# Patient Record
Sex: Female | Born: 1965
Health system: Southern US, Community
[De-identification: ages and names within clinical notes are randomized; demographics above are authoritative.]

## PROBLEM LIST (undated history)

## (undated) DIAGNOSIS — Z973 Presence of spectacles and contact lenses: Secondary | ICD-10-CM

## (undated) DIAGNOSIS — H269 Unspecified cataract: Secondary | ICD-10-CM

## (undated) DIAGNOSIS — R569 Unspecified convulsions: Secondary | ICD-10-CM

## (undated) DIAGNOSIS — D649 Anemia, unspecified: Secondary | ICD-10-CM

## (undated) DIAGNOSIS — E785 Hyperlipidemia, unspecified: Secondary | ICD-10-CM

## (undated) DIAGNOSIS — I639 Cerebral infarction, unspecified: Secondary | ICD-10-CM

## (undated) DIAGNOSIS — N2889 Other specified disorders of kidney and ureter: Secondary | ICD-10-CM

## (undated) DIAGNOSIS — R233 Spontaneous ecchymoses: Secondary | ICD-10-CM

## (undated) DIAGNOSIS — K219 Gastro-esophageal reflux disease without esophagitis: Secondary | ICD-10-CM

## (undated) DIAGNOSIS — R238 Other skin changes: Secondary | ICD-10-CM

## (undated) DIAGNOSIS — J189 Pneumonia, unspecified organism: Secondary | ICD-10-CM

## (undated) DIAGNOSIS — K922 Gastrointestinal hemorrhage, unspecified: Secondary | ICD-10-CM

## (undated) DIAGNOSIS — N186 End stage renal disease: Secondary | ICD-10-CM

## (undated) DIAGNOSIS — Z992 Dependence on renal dialysis: Secondary | ICD-10-CM

## (undated) DIAGNOSIS — R011 Cardiac murmur, unspecified: Secondary | ICD-10-CM

## (undated) DIAGNOSIS — E039 Hypothyroidism, unspecified: Secondary | ICD-10-CM

## (undated) DIAGNOSIS — I10 Essential (primary) hypertension: Secondary | ICD-10-CM

## (undated) DIAGNOSIS — C801 Malignant (primary) neoplasm, unspecified: Secondary | ICD-10-CM

## (undated) DIAGNOSIS — G629 Polyneuropathy, unspecified: Secondary | ICD-10-CM

## (undated) HISTORY — DX: Cerebral infarction, unspecified: I63.9

---
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
## 1985-07-06 HISTORY — PX: ECTOPIC PREGNANCY SURGERY: SHX613

## 1998-04-22 ENCOUNTER — Emergency Department (HOSPITAL_COMMUNITY): Admission: EM | Admit: 1998-04-22 | Discharge: 1998-04-22 | Payer: Self-pay | Admitting: Emergency Medicine

## 2000-04-21 ENCOUNTER — Encounter: Payer: Self-pay | Admitting: Obstetrics

## 2000-04-21 ENCOUNTER — Ambulatory Visit (HOSPITAL_COMMUNITY): Admission: RE | Admit: 2000-04-21 | Discharge: 2000-04-21 | Payer: Self-pay | Admitting: Obstetrics

## 2001-05-25 ENCOUNTER — Encounter: Payer: Self-pay | Admitting: Obstetrics and Gynecology

## 2001-06-01 ENCOUNTER — Encounter (INDEPENDENT_AMBULATORY_CARE_PROVIDER_SITE_OTHER): Payer: Self-pay

## 2001-06-01 ENCOUNTER — Inpatient Hospital Stay (HOSPITAL_COMMUNITY): Admission: RE | Admit: 2001-06-01 | Discharge: 2001-06-03 | Payer: Self-pay | Admitting: Obstetrics and Gynecology

## 2002-02-02 ENCOUNTER — Ambulatory Visit (HOSPITAL_COMMUNITY): Admission: RE | Admit: 2002-02-02 | Discharge: 2002-02-02 | Payer: Self-pay | Admitting: *Deleted

## 2002-02-02 ENCOUNTER — Encounter: Payer: Self-pay | Admitting: *Deleted

## 2003-04-03 ENCOUNTER — Other Ambulatory Visit: Admission: RE | Admit: 2003-04-03 | Discharge: 2003-04-03 | Payer: Self-pay | Admitting: *Deleted

## 2008-12-07 ENCOUNTER — Encounter (HOSPITAL_COMMUNITY): Admission: RE | Admit: 2008-12-07 | Discharge: 2009-01-02 | Payer: Self-pay | Admitting: Internal Medicine

## 2009-06-06 ENCOUNTER — Encounter: Admission: RE | Admit: 2009-06-06 | Discharge: 2009-06-06 | Payer: Self-pay | Admitting: Internal Medicine

## 2010-02-08 ENCOUNTER — Emergency Department (HOSPITAL_COMMUNITY): Admission: EM | Admit: 2010-02-08 | Discharge: 2010-02-09 | Payer: Self-pay | Admitting: Emergency Medicine

## 2010-02-11 ENCOUNTER — Emergency Department (HOSPITAL_COMMUNITY): Admission: EM | Admit: 2010-02-11 | Discharge: 2010-02-11 | Payer: Self-pay | Admitting: Family Medicine

## 2010-11-19 ENCOUNTER — Other Ambulatory Visit (HOSPITAL_COMMUNITY): Payer: Self-pay | Admitting: Nephrology

## 2010-11-19 DIAGNOSIS — R809 Proteinuria, unspecified: Secondary | ICD-10-CM

## 2010-11-21 NOTE — Discharge Summary (Signed)
[facility]  Patient:    CHRISTEEN, LOVE Visit Number: [id number] MRN: [id number]          Service Type: GYN Location: 4W 0455 01 Attending Physician:  Richarda Blade Dictated by:   Chauncey Cruel. Olena Mater, M.D. Admit Date:  06/01/2001 Disc. Date: 06/03/01                             Discharge Summary  ADMISSION DIAGNOSIS: Uterine fibroids.  DISCHARGE DIAGNOSIS: Uterine fibroids.  OPERATION PERFORMED: Exploratory laparotomy, multiple myomectomy, right salpingo-oophorectomy, lysis of adhesions, and uterine suspension.  BRIEF HISTORY: The patient is a 45 year old, nulligravid female, actually she is a gravida 1 post ectopic, who presented with a large pelvic mass and known uterine fibroids for myomectomy. Detailed informed consent had been given to the patient.  LABORATORY STUDIES: A chest x-ray was normal.  Hemoglobin 12.8, hematocrit was 38. Basic metabolic profile was within normal limits. A calcium was 10.5.  HOSPITAL COURSE: The patient was admitted to the hospital and underwent an uneventful laparotomy with myomectomy and multiple right oophorectomy, lysis of adhesions, and uterine suspension.  Her postoperative course was uncomplicated. She remained afebrile and without complaints. On the day of discharge, full instructions were given to the patient. She was asked to call for fever or unusual pain, or nausea or vomiting or any other problem that she may encounter.  She will return to the office in two weeks. She was given a prescription for Percocet and Ambien.  CONDITION ON DISCHARGE: Improved. Dictated by:   S. Olena Mater, M.D. Attending Physician:  Richarda Blade DD:  06/03/01 TD:  06/03/01 Job: [id number]

## 2010-11-21 NOTE — Op Note (Signed)
[facility]  Patient:    Terry Juarez, Terry Juarez Visit Number: [id number] MRN: [id number]          Service Type: GYN Location: 4W 0455 01 Attending Physician:  Richarda Blade Dictated by:   Chauncey Cruel. Olena Mater, M.D. Proc. Date: 06/01/01 Admit Date:  06/01/2001                             Operative Report  PREOPERATIVE DIAGNOSIS:  Multiple fibroids.  POSTOPERATIVE DIAGNOSIS:  Multiple fibroids.  OPERATIONS PERFORMED:  1. Myomectomy, multiple.  2. Right oophorectomy.  3. Lysis of adhesions.  4. Uterine suspension.  DESCRIPTION OF PROCEDURE:  The patient was placed in lithotomy position, prepped and draped in the usual fashion. A Foley catheter was inserted. A transverse incision was made in the skin and extended in layers in the peritoneal cavity which was entered vertically. Exploration of the upper abdomen revealed a normal smooth liver and two normal kidneys. Hemostasis was accomplished with the Bovie. Visualization of the pelvis revealed a large about 14 cm solid fibroid that was attached to the posterior surface of the uterus. The fibroid was then elevated and removed. The area of attachment to the posterior side of the uterus was sutured with interrupted sutures of 3-0 Vicryl and a couple of #0 chromic sutures. There was a small fibroid inferiorly and this was removed and the ______ was closed with 3-0 Vicryl. Evaluation of the ovary revealed the ovary to be entrapped in massive adhesions on the right involving the base of the cecum. These were lysed, the infundibulopelvic ligament was skeletonized and ligated in the right ovary which consisted of an inflammatory mass that was removed. The left tube and ovary were quite normal and the left ovary was somewhat adherent posteriorly. The round ligaments were then shortened to afford a good vault suspension. Intercede was placed over the fibroid after copious irrigation was used to ensure  hemostasis. 3-0 Vicryl sutures were placed to obtain hemostasis. The parietoperitoneum was then closed with 2-0 PDS, the fascia was closed with 2-0 PDS and the skin was closed with 4-0 PDS. A dry sterile dressing was applied. Ms. Peak tolerated this procedure well and was sent to the recovery room in good condition. Dictated by:   S. Olena Mater, M.D. Attending Physician:  Richarda Blade DD:  06/01/01 TD:  06/01/01 Job: [id number]

## 2010-11-21 NOTE — H&P (Signed)
[facility]  Patient:    [name], [name] Visit Number: [id number] MRN: [id number]          Service Type: Attending:  Selinda Orion, M.D. Dictated by:   S. Olena Mater, M.D. Adm. Date:  05/30/01                           History and Physical  CHIEF COMPLAINT:  Heavy periods and large uterine fibroid.  HISTORY OF PRESENT ILLNESS:  Terry Juarez is a 45 year old, gravida 1, para 0, status post ectopic pregnancy in 55.  She is admitted for myomectomy for a large 14 cm midline pelvic mass and heavy periods.  She has not conceived since her ectopic pregnancy.  She has no episodes of pelvic infection that she remembers.  Her Pap smear is normal.  MEDICATIONS:  Her only medications include hydrochlorothiazide for mild hypertension.  REVIEW OF SYSTEMS:  HEENT:  She wears glasses, but no frequent headaches or dizziness.  No frequent sore throats.  Heart:  No history of rheumatic fever. No heart murmur.  She has mild hypertension, which is controlled with diet, hydrochlorothiazide, and diuretics.  No history of mitral valve prolapse. Lungs:  No chronic cough.  No weight loss or hemoptysis.  GU:  No frequency or stress incontinence.  GI:  No bowel habit change.  No melena.  No anorexia. Muscles, Bones, and Joints:  No fractures or arthritis.  SOCIAL HISTORY:  She works in a retirement home.  FAMILY HISTORY:  Her mother died at 31.  Her father is living and well at 64. She has one brother.  Her mother has some form of cancer of the throat.  Her father has hypertension, as well as her paternal grandmother.  PHYSICAL EXAMINATION:  A well-developed, well-nourished, black female who is alert, oriented, and answers questions appropriately.  VITAL SIGNS:  The blood pressure is 120/60.  WEIGHT:  131 pounds.  HEENT:  Examination of the ears, nose, and throat is unremarkable.  The oropharynx is not injected.  NECK:  Supple.  Carotid pulses are equal without bruits.   No adenopathy appreciated.  The thyroid is not enlarged.  The trachea is in the midline.  BREASTS:  No masses or tenderness.  Axillae negative.  LUNGS:  Clear to P&A.  HEART:  Normal sinus rhythm.  No murmurs.  ABDOMEN:  Soft and flat.  The liver, spleen, and kidneys are not palpated. The uterus is palpated about two fingerbreadths above the umbilicus.  There is an old transverse incision that is well healed.  EXTREMITIES:  Good range of motion and equal pulses and reflexes.  PELVIC:  A clean cervix.  There is menstrual blood in the cervix.  The uterus is anterior, about 10-14 weeks size with no adnexal masses.  There appears to be some fixation in the cul-de-sac.  IMPRESSION: 1. Uterine fibroids. 2. Possible chronic pelvic inflammatory disease versus endometriosis.  PLAN:  Laparotomy with conservative measures if at all possible, although hysterectomy is an option.  I have apprised Ms. Peak and her boyfriend of these facts and they have agreed.  The risks and benefits, including hemorrhage, damage to bladder and bowel, and infection have been discussed with the patient. Dictated by:   S. Olena Mater, M.D. Attending:  Selinda Orion, M.D. DD:  05/31/01 TD:  05/31/01 Job: [id number]

## 2010-11-24 ENCOUNTER — Ambulatory Visit (HOSPITAL_COMMUNITY)
Admission: RE | Admit: 2010-11-24 | Discharge: 2010-11-24 | Disposition: A | Discharge status: Home / Self Care | Payer: 59 | Source: Ambulatory Visit | Attending: Nephrology | Admitting: Nephrology

## 2010-11-24 ENCOUNTER — Other Ambulatory Visit: Payer: Self-pay | Admitting: Interventional Radiology

## 2010-11-24 DIAGNOSIS — R809 Proteinuria, unspecified: Secondary | ICD-10-CM | POA: Insufficient documentation

## 2010-11-24 DIAGNOSIS — I10 Essential (primary) hypertension: Secondary | ICD-10-CM | POA: Insufficient documentation

## 2010-11-24 DIAGNOSIS — Z01812 Encounter for preprocedural laboratory examination: Secondary | ICD-10-CM | POA: Insufficient documentation

## 2010-11-24 LAB — CBC
Platelets: 339 10*3/uL (ref 150–400)
RDW: 11.9 % (ref 11.5–15.5)
WBC: 5.2 10*3/uL (ref 4.0–10.5)

## 2010-11-24 LAB — PROTIME-INR: INR: 0.95 (ref 0.00–1.49)

## 2010-11-24 LAB — APTT: aPTT: 34 seconds (ref 24–37)

## 2011-12-04 ENCOUNTER — Other Ambulatory Visit (HOSPITAL_COMMUNITY): Payer: Self-pay | Admitting: *Deleted

## 2011-12-04 MED ORDER — SODIUM CHLORIDE 0.9 % IV SOLN: INTRAVENOUS | Status: DC

## 2011-12-04 MED ORDER — FERUMOXYTOL INJECTION 510 MG/17 ML: 510.0000 mg | INTRAVENOUS | Status: DC

## 2011-12-07 ENCOUNTER — Encounter (HOSPITAL_COMMUNITY): Payer: 59

## 2011-12-07 ENCOUNTER — Encounter (HOSPITAL_COMMUNITY)
Admission: RE | Admit: 2011-12-07 | Discharge: 2011-12-07 | Disposition: A | Discharge status: Home / Self Care | Payer: 59 | Source: Ambulatory Visit | Attending: Nephrology | Admitting: Nephrology

## 2011-12-07 DIAGNOSIS — D509 Iron deficiency anemia, unspecified: Secondary | ICD-10-CM | POA: Insufficient documentation

## 2011-12-07 MED ORDER — SODIUM CHLORIDE 0.9 % IV SOLN
INTRAVENOUS | Status: DC
Start: 2011-12-07 — End: 2011-12-08
  Administered 2011-12-07: 13:00:00 via INTRAVENOUS

## 2011-12-07 MED ORDER — FERUMOXYTOL INJECTION 510 MG/17 ML
510.0000 mg | INTRAVENOUS | Status: DC
  Administered 2011-12-07: 510 mg via INTRAVENOUS
  Filled 2011-12-07: qty 17

## 2011-12-11 ENCOUNTER — Encounter (HOSPITAL_COMMUNITY)
Admission: RE | Admit: 2011-12-11 | Discharge: 2011-12-11 | Disposition: A | Discharge status: Home / Self Care | Payer: 59 | Source: Ambulatory Visit | Attending: Nephrology | Admitting: Nephrology

## 2011-12-11 MED ORDER — SODIUM CHLORIDE 0.9 % IV SOLN
INTRAVENOUS | Status: AC
  Administered 2011-12-11: 250 mL via INTRAVENOUS

## 2011-12-11 MED ORDER — FERUMOXYTOL INJECTION 510 MG/17 ML
510.0000 mg | INTRAVENOUS | Status: AC
  Administered 2011-12-11: 510 mg via INTRAVENOUS
  Filled 2011-12-11: qty 17

## 2013-08-05 ENCOUNTER — Emergency Department (HOSPITAL_COMMUNITY): Payer: 59

## 2013-08-05 ENCOUNTER — Emergency Department (HOSPITAL_COMMUNITY)
Admission: EM | Admit: 2013-08-05 | Discharge: 2013-08-05 | Disposition: A | Discharge status: Home / Self Care | Payer: 59 | Attending: Emergency Medicine | Admitting: Emergency Medicine

## 2013-08-05 ENCOUNTER — Encounter (HOSPITAL_COMMUNITY): Payer: Self-pay | Admitting: Emergency Medicine

## 2013-08-05 DIAGNOSIS — Z79899 Other long term (current) drug therapy: Secondary | ICD-10-CM | POA: Insufficient documentation

## 2013-08-05 DIAGNOSIS — R42 Dizziness and giddiness: Secondary | ICD-10-CM | POA: Insufficient documentation

## 2013-08-05 DIAGNOSIS — Z3202 Encounter for pregnancy test, result negative: Secondary | ICD-10-CM | POA: Insufficient documentation

## 2013-08-05 DIAGNOSIS — R112 Nausea with vomiting, unspecified: Secondary | ICD-10-CM | POA: Insufficient documentation

## 2013-08-05 DIAGNOSIS — B9789 Other viral agents as the cause of diseases classified elsewhere: Secondary | ICD-10-CM | POA: Insufficient documentation

## 2013-08-05 DIAGNOSIS — I10 Essential (primary) hypertension: Secondary | ICD-10-CM | POA: Insufficient documentation

## 2013-08-05 DIAGNOSIS — B349 Viral infection, unspecified: Secondary | ICD-10-CM

## 2013-08-05 DIAGNOSIS — H9209 Otalgia, unspecified ear: Secondary | ICD-10-CM | POA: Insufficient documentation

## 2013-08-05 HISTORY — DX: Essential (primary) hypertension: I10

## 2013-08-05 LAB — URINE MICROSCOPIC-ADD ON

## 2013-08-05 LAB — CBC WITH DIFFERENTIAL/PLATELET
BASOS PCT: 1 % (ref 0–1)
Basophils Absolute: 0.1 10*3/uL (ref 0.0–0.1)
EOS ABS: 0 10*3/uL (ref 0.0–0.7)
EOS PCT: 0 % (ref 0–5)
HEMATOCRIT: 37.9 % (ref 36.0–46.0)
HEMOGLOBIN: 12.4 g/dL (ref 12.0–15.0)
Lymphocytes Relative: 5 % — ABNORMAL LOW (ref 12–46)
Lymphs Abs: 0.7 10*3/uL (ref 0.7–4.0)
MCH: 32.3 pg (ref 26.0–34.0)
MCHC: 32.7 g/dL (ref 30.0–36.0)
MCV: 98.7 fL (ref 78.0–100.0)
MONO ABS: 0.6 10*3/uL (ref 0.1–1.0)
MONOS PCT: 5 % (ref 3–12)
Neutro Abs: 11.8 10*3/uL — ABNORMAL HIGH (ref 1.7–7.7)
Neutrophils Relative %: 90 % — ABNORMAL HIGH (ref 43–77)
Platelets: 397 10*3/uL (ref 150–400)
RBC: 3.84 MIL/uL — ABNORMAL LOW (ref 3.87–5.11)
RDW: 13.5 % (ref 11.5–15.5)
WBC: 13.2 10*3/uL — ABNORMAL HIGH (ref 4.0–10.5)

## 2013-08-05 LAB — URINALYSIS, ROUTINE W REFLEX MICROSCOPIC
Bilirubin Urine: NEGATIVE
GLUCOSE, UA: NEGATIVE mg/dL
Ketones, ur: NEGATIVE mg/dL
LEUKOCYTES UA: NEGATIVE
Nitrite: NEGATIVE
SPECIFIC GRAVITY, URINE: 1.018 (ref 1.005–1.030)
Urobilinogen, UA: 0.2 mg/dL (ref 0.0–1.0)
pH: 5 (ref 5.0–8.0)

## 2013-08-05 LAB — COMPREHENSIVE METABOLIC PANEL
ALK PHOS: 75 U/L (ref 39–117)
ALT: 6 U/L (ref 0–35)
AST: 14 U/L (ref 0–37)
Albumin: 3.8 g/dL (ref 3.5–5.2)
BUN: 47 mg/dL — ABNORMAL HIGH (ref 6–23)
CALCIUM: 9.2 mg/dL (ref 8.4–10.5)
CO2: 15 meq/L — AB (ref 19–32)
Chloride: 113 mEq/L — ABNORMAL HIGH (ref 96–112)
Creatinine, Ser: 2.5 mg/dL — ABNORMAL HIGH (ref 0.50–1.10)
GFR calc Af Amer: 25 mL/min — ABNORMAL LOW (ref >90)
GFR, EST NON AFRICAN AMERICAN: 22 mL/min — AB (ref >90)
Glucose, Bld: 125 mg/dL — ABNORMAL HIGH (ref 70–99)
POTASSIUM: 4.8 meq/L (ref 3.7–5.3)
SODIUM: 145 meq/L (ref 137–147)
TOTAL PROTEIN: 7.6 g/dL (ref 6.0–8.3)
Total Bilirubin: 0.3 mg/dL (ref 0.3–1.2)

## 2013-08-05 LAB — RAPID STREP SCREEN (MED CTR MEBANE ONLY): Streptococcus, Group A Screen (Direct): NEGATIVE

## 2013-08-05 LAB — TROPONIN I: Troponin I: <0.3 ng/mL (ref <0.30)

## 2013-08-05 LAB — CG4 I-STAT (LACTIC ACID): LACTIC ACID, VENOUS: 1.26 mmol/L (ref 0.5–2.2)

## 2013-08-05 LAB — POCT PREGNANCY, URINE: Preg Test, Ur: NEGATIVE

## 2013-08-05 MED ORDER — ONDANSETRON HCL 4 MG/2ML IJ SOLN
4.0000 mg | Freq: Once | INTRAMUSCULAR | Status: AC
  Administered 2013-08-05: 4 mg via INTRAVENOUS
  Filled 2013-08-05: qty 2

## 2013-08-05 MED ORDER — SODIUM CHLORIDE 0.9 % IV BOLUS (SEPSIS)
1000.0000 mL | Freq: Once | INTRAVENOUS | Status: AC
  Administered 2013-08-05: 1000 mL via INTRAVENOUS

## 2013-08-05 MED ORDER — ACETAMINOPHEN 325 MG PO TABS
650.0000 mg | ORAL_TABLET | Freq: Once | ORAL | Status: AC
  Administered 2013-08-05: 650 mg via ORAL
  Filled 2013-08-05: qty 2

## 2013-08-05 MED ORDER — ONDANSETRON HCL 4 MG PO TABS: 4.0000 mg | ORAL_TABLET | Freq: Four times a day (QID) | ORAL | Status: DC

## 2013-08-05 NOTE — ED Notes (Signed)
Pt c/o flu like symptoms increasingly worse over past week. Reports headaches, sore throat, vomiting worse today. A&Ox4, resp e/u

## 2013-08-05 NOTE — ED Notes (Signed)
Pt drank 7 oz Coke and ate 2 packs of saltine crackers without regurgitation.

## 2013-08-05 NOTE — ED Provider Notes (Signed)
CSN: [id number]     Arrival date & time 08/05/13  1748 History   First MD Initiated Contact with Patient 08/05/13 1756     Chief Complaint  Patient presents with  . Influenza   (Consider location/radiation/quality/duration/timing/severity/associated sxs/prior Treatment) The history is provided by the patient. No language interpreter was used.  Terry Juarez is a 48 year old female with past medical history of hypertension presenting to emergency department with headache, sore throat, generalized body aches, cough, nasal congestion, ear pain, nausea, weakness, fatigue, vomiting. Patient reported that the headache, generalized bodyaches, sore throat near pain and been ongoing for approximately one week and is progressively gotten worse. Patient reported that she's been having nausea and vomiting starting yesterday. Stated that she had at least 2 episodes of emesis yesterday with 4 episodes of emesis today mainly of food contents. Stated that she's been unable to keep any food or liquids down today. Reported that she's been having chills intermittently. Reported that she's been using Tylenol with minimal relief. Denies sick contacts, diarrhea, hematochezia, melena, abdominal pain, urinary symptoms, fever. PCP Dr. Osborne Casco  Past Medical History  Diagnosis Date  . Hypertension    History reviewed. No pertinent past surgical history. History reviewed. No pertinent family history. History  Substance Use Topics  . Smoking status: Never Smoker   . Smokeless tobacco: Not on file  . Alcohol Use: Yes   OB History   Grav Para Term Preterm Abortions TAB SAB Ect Mult Living                 Review of Systems  Constitutional: Positive for chills. Negative for fever.  HENT: Positive for congestion, ear pain (pressure bilaterally) and sore throat. Negative for trouble swallowing.   Eyes: Negative for visual disturbance.  Respiratory: Positive for cough. Negative for chest tightness and shortness of  breath.   Cardiovascular: Negative for chest pain.  Gastrointestinal: Positive for nausea and vomiting. Negative for abdominal pain, diarrhea, constipation and blood in stool.  Genitourinary: Negative for dysuria and decreased urine volume.  Musculoskeletal: Positive for myalgias. Negative for back pain.  Neurological: Positive for dizziness and headaches. Negative for weakness and numbness.  All other systems reviewed and are negative.    Allergies  Review of patient's allergies indicates no known allergies.  Home Medications   Current Outpatient Rx  Name  Route  Sig  Dispense  Refill  . amLODipine (NORVASC) 10 MG tablet   Oral   Take 10 mg by mouth daily.         Marland Kitchen atorvastatin (LIPITOR) 20 MG tablet   Oral   Take 20 mg by mouth daily.         . nebivolol (BYSTOLIC) 10 MG tablet   Oral   Take 10 mg by mouth daily.         Marland Kitchen telmisartan (MICARDIS) 40 MG tablet   Oral   Take 40 mg by mouth daily.         . ondansetron (ZOFRAN) 4 MG tablet   Oral   Take 1 tablet (4 mg total) by mouth every 6 (six) hours.   12 tablet   0    BP 162/87  Pulse 89  Temp(Src) 97.8 F (36.6 C) (Oral)  Resp 16  SpO2 100% Physical Exam  Nursing note and vitals reviewed. Constitutional: She is oriented to person, place, and time. She appears well-developed and well-nourished. No distress.  HENT:  Head: Normocephalic and atraumatic.  Right Ear: External ear normal.  Left Ear: External ear normal.  Mouth/Throat: Oropharynx is clear and moist. No oropharyngeal exudate.  Eyes: Conjunctivae and EOM are normal. Pupils are equal, round, and reactive to light. Right eye exhibits no discharge. Left eye exhibits no discharge.  Neck: Normal range of motion. Neck supple. No tracheal deviation present.  Negative neck stiffness Negative nuchal rigidity Negative cervical lymphadenopathy Negative meningeal signs Negative c-spine tenderness  Cardiovascular: Normal rate, regular rhythm and  normal heart sounds.  Exam reveals no friction rub.   No murmur heard. Pulses:      Radial pulses are 2+ on the right side, and 2+ on the left side.       Dorsalis pedis pulses are 2+ on the right side, and 2+ on the left side.  Cap refill less than 3 seconds Negative pain upon palpation to the calves bilaterally  Pulmonary/Chest: Effort normal and breath sounds normal. No respiratory distress. She has no wheezes. She has no rales. She exhibits no tenderness.  Abdominal: Soft. Bowel sounds are normal. There is no tenderness. There is no guarding.  Musculoskeletal: Normal range of motion.  Full ROM to upper and lower extremities without difficulty noted, negative ataxia noted.  Lymphadenopathy:    She has no cervical adenopathy.  Neurological: She is alert and oriented to person, place, and time. No cranial nerve deficit. She exhibits normal muscle tone. Coordination normal.  Cranial nerves III-XII grossly intact Strength 5+/5+ to upper and lower extremities bilaterally with resistance applied, equal distribution noted  Skin: Skin is warm and dry. No rash noted. She is not diaphoretic. No erythema.  Psychiatric: She has a normal mood and affect. Her behavior is normal. Thought content normal.    ED Course  Procedures (including critical care time)  7:42 PM This provider spoke with patient regarding elevated Creatinine and BUN - patient reported that she has been seeing a kidney doctor, reported that she does have kidney issues. Stated that she sees Dr. Shirl Harris, [facility]. Reported that her kidney functioning has been stabilized and reported that she sees her kidney physician every 6 months instead of 3 months.   8:09 PM This provider spoke with Dr. Jonnie Finner, [place] - discussed case, history, and presentation. Physician reported that he is unable to get into the system to identify the patient's last creatinine.  8:53 PM Spoke with Dr. Brigitte Pulse,  [facility] on-call physician - discussed, case, history, presentation, labs, and imaging. Discussed what nephrology recommended. Physician reported that this is a little extreme for a healthy 48 year old female to be admitted for hydration. Reported that he has access to the patient's chart and will get back to this provider once he has reviewed.   8:58 PM Dr. Brigitte Pulse returned phone call - reported that patient's last BUN and creatinine were recorded back in 04/2013 where Creatinine was around 2.50 - reported that there has been no change, patient has been steady in her creatinine level. Discussed elevation of chloride and drop in bicarb of patient and physician reported that this is normal. Recommended PO fluid challenge - if patient passes than she can be discharged, if patient does not pass then recommended for this physician to be called back and for patient to be admitted.   9:44 PM This provider reviewed patient's EKG with Dr. Raliegh Ip. Ward - no findings of STEMI or ischemic findings. Suspicion to be pericarditis with ST segment elevations in diffuse leads versus viral illness. As per physician, reported that if patient  is chest pain free can be discharged home. Due to patient having kidney insufficiency reported patient not to take NSAIDs and to continue with Tylenol as needed.   10:13 PM This provider re-assessed the patient. Patient reported that she is feeling better. Stated that she was able to keep down crackers and fluids. Patient denied chest pain, shortness of breath, difficulty breathing. Discussed labs and imaging in great detail with patient. Patient reported that she is familiar with her Creatinine being around 2.50, patient reported that her creatinine levels have bee higher in the past but she has been properly monitoring with PCP.   Results for orders placed during the hospital encounter of 08/05/13  RAPID STREP SCREEN      Result Value Range   Streptococcus, Group A Screen  (Direct) NEGATIVE  NEGATIVE  URINALYSIS, ROUTINE W REFLEX MICROSCOPIC      Result Value Range   Color, Urine YELLOW  YELLOW   APPearance CLEAR  CLEAR   Specific Gravity, Urine 1.018  1.005 - 1.030   pH 5.0  5.0 - 8.0   Glucose, UA NEGATIVE  NEGATIVE mg/dL   Hgb urine dipstick TRACE (*) NEGATIVE   Bilirubin Urine NEGATIVE  NEGATIVE   Ketones, ur NEGATIVE  NEGATIVE mg/dL   Protein, ur >300 (*) NEGATIVE mg/dL   Urobilinogen, UA 0.2  0.0 - 1.0 mg/dL   Nitrite NEGATIVE  NEGATIVE   Leukocytes, UA NEGATIVE  NEGATIVE  COMPREHENSIVE METABOLIC PANEL      Result Value Range   Sodium 145  137 - 147 mEq/L   Potassium 4.8  3.7 - 5.3 mEq/L   Chloride 113 (*) 96 - 112 mEq/L   CO2 15 (*) 19 - 32 mEq/L   Glucose, Bld 125 (*) 70 - 99 mg/dL   BUN 47 (*) 6 - 23 mg/dL   Creatinine, Ser 2.50 (*) 0.50 - 1.10 mg/dL   Calcium 9.2  8.4 - 10.5 mg/dL   Total Protein 7.6  6.0 - 8.3 g/dL   Albumin 3.8  3.5 - 5.2 g/dL   AST 14  0 - 37 U/L   ALT 6  0 - 35 U/L   Alkaline Phosphatase 75  39 - 117 U/L   Total Bilirubin 0.3  0.3 - 1.2 mg/dL   GFR calc non Af Amer 22 (*) >90 mL/min   GFR calc Af Amer 25 (*) >90 mL/min  CBC WITH DIFFERENTIAL      Result Value Range   WBC 13.2 (*) 4.0 - 10.5 K/uL   RBC 3.84 (*) 3.87 - 5.11 MIL/uL   Hemoglobin 12.4  12.0 - 15.0 g/dL   HCT 37.9  36.0 - 46.0 %   MCV 98.7  78.0 - 100.0 fL   MCH 32.3  26.0 - 34.0 pg   MCHC 32.7  30.0 - 36.0 g/dL   RDW 13.5  11.5 - 15.5 %   Platelets 397  150 - 400 K/uL   Neutrophils Relative % 90 (*) 43 - 77 %   Neutro Abs 11.8 (*) 1.7 - 7.7 K/uL   Lymphocytes Relative 5 (*) 12 - 46 %   Lymphs Abs 0.7  0.7 - 4.0 K/uL   Monocytes Relative 5  3 - 12 %   Monocytes Absolute 0.6  0.1 - 1.0 K/uL   Eosinophils Relative 0  0 - 5 %   Eosinophils Absolute 0.0  0.0 - 0.7 K/uL   Basophils Relative 1  0 - 1 %   Basophils Absolute 0.1  0.0 - 0.1 K/uL  TROPONIN I      Result Value Range   Troponin I <0.30  <0.30 ng/mL  URINE MICROSCOPIC-ADD ON       Result Value Range   Squamous Epithelial / LPF RARE  RARE   RBC / HPF 0-2  <3 RBC/hpf   Urine-Other MUCOUS PRESENT    POCT PREGNANCY, URINE      Result Value Range   Preg Test, Ur NEGATIVE  NEGATIVE  CG4 I-STAT (LACTIC ACID)      Result Value Range   Lactic Acid, Venous 1.26  0.5 - 2.2 mmol/L   Dg Chest 2 View  08/05/2013   CLINICAL DATA:  Cough, sore throat  EXAM: CHEST  2 VIEW  COMPARISON:  None.  FINDINGS: The heart size and mediastinal contours are within normal limits. Both lungs are clear. The visualized skeletal structures are unremarkable.  IMPRESSION: No active cardiopulmonary disease.   Electronically Signed   By: Kathreen Devoid   On: 08/05/2013 20:24   Labs Review Labs Reviewed  URINALYSIS, ROUTINE W REFLEX MICROSCOPIC - Abnormal; Notable for the following:    Hgb urine dipstick TRACE (*)    Protein, ur >300 (*)    All other components within normal limits  COMPREHENSIVE METABOLIC PANEL - Abnormal; Notable for the following:    Chloride 113 (*)    CO2 15 (*)    Glucose, Bld 125 (*)    BUN 47 (*)    Creatinine, Ser 2.50 (*)    GFR calc non Af Amer 22 (*)    GFR calc Af Amer 25 (*)    All other components within normal limits  CBC WITH DIFFERENTIAL - Abnormal; Notable for the following:    WBC 13.2 (*)    RBC 3.84 (*)    Neutrophils Relative % 90 (*)    Neutro Abs 11.8 (*)    Lymphocytes Relative 5 (*)    All other components within normal limits  RAPID STREP SCREEN  CULTURE, GROUP A STREP  TROPONIN I  URINE MICROSCOPIC-ADD ON  POCT PREGNANCY, URINE  CG4 I-STAT (LACTIC ACID)   Imaging Review Dg Chest 2 View  08/05/2013   CLINICAL DATA:  Cough, sore throat  EXAM: CHEST  2 VIEW  COMPARISON:  None.  FINDINGS: The heart size and mediastinal contours are within normal limits. Both lungs are clear. The visualized skeletal structures are unremarkable.  IMPRESSION: No active cardiopulmonary disease.   Electronically Signed   By: Kathreen Devoid   On: 08/05/2013 20:24     EKG Interpretation   None      Date: 08/05/2013  Rate: 82  Rhythm: normal sinus rhythm  QRS Axis: normal  Intervals: normal  ST/T Wave abnormalities: normal  Conduction Disutrbances:none  Narrative Interpretation: minimal ST elevation in diffuse leads  Old EKG Reviewed: none available EKG analyzed and reviewed by this provider and attending physician.    MDM   1. Viral syndrome    Medications  sodium chloride 0.9 % bolus 1,000 mL (0 mLs Intravenous Stopped 08/05/13 2117)  ondansetron (ZOFRAN) injection 4 mg (4 mg Intravenous Given 08/05/13 2056)  sodium chloride 0.9 % bolus 1,000 mL (1,000 mLs Intravenous New Bag/Given 08/05/13 2118)  acetaminophen (TYLENOL) tablet 650 mg (650 mg Oral Given 08/05/13 2200)   Filed Vitals:   08/05/13 2120  BP: 162/87  Pulse: 89  Temp:   Resp: 16    Patient presenting to the ED with cough that is dry, nasal congestion,  sore throat, headache, generalized bodyaches that have been present for one week. Patient reported that she developed nausea and vomiting yesterday - stated at least 2 episodes of emesis yesterday and 4 episodes of emesis today - mainly of liquid contents. Stated that she has been using Tylenol with minimal relief.  Alert and oriented. GCS 15. Heart rate and rhythm normal. Lungs clear to auscultation to upper and lower lobes, good lung expansion. Radial and DP pulses 2+ bilaterally. Cap refill < 3 seconds. Negative swelling or pitting edema noted to bilateral lower extremities. Negative neck stiffness, negative nuchal rigidity - negative meningeal signs. Unremarkable oral exam. Ears negative bilaterally. Full ROM to upper and lower extremities bilaterally without difficulty or ataxia noted. Strength intact with resistance applied, equal distribution. Negative calf pain.  EKG noted ST segment elevation in diffuse leads - discussed this and reviewed with Dr. Raliegh Ip. Ward who reported that this could be pericarditis versus dehydration/viral  syndrome. First troponin negative elevation. CBC noted mild elevation white blood cell count to be 13.2 with elevated neutrophils 11.8. CMP noted elevated creatinine 2.50 and elevated BUN of 47 - there are no previous labs to compare these levels to see if there is been an increase or decrease. Chloride has been increased with a reading of 113, bicarbonate decreased at 15. Glucose levels 125. Patient has an anion gap of 17.0 mEq per liter. Lactic acid negative elevation-1.26. Rapid strep negative. Urine pregnancy negative. UA noted trace of Hgb with negative nitrites and leukocytes - negative sign of infection. Chest xray negative cardiopulmonary disease noted.  This provider spoke with nephrology - patient being followed at [facility] by Dr. [name] - no access to chart for most recent Creatinine. This provider spoke with on-call physician for [facility] who reported that patient's most recent Creatinine was in 04/2013 where her creatinine was around 2.50 - reported that this is normal for the patient to have this creatinine level. This provider discussed labs with physician who reported that this is normal for the patient. This provider recommended admission for hydration - on-call physician did not agree, recommended that if patient can tolerate fluids PO than patient can be discharged.  Negative findings of pneumonia. Negative PTX. Doubt ACS. Suspicion to be viral illness. Patient stable, afebrile. Patient tolerated fluid PO without episodes of emesis while in ED setting. Patient responded well to IV fluids. Patient denied chest pain, shortness of breath, difficulty breathing. Discharged patient. Discussed with patient to rest and stay hydrated - discussed diet. Recommended Tylenol - patient cannot tolerate NSAIDs due to kidneys. Discharged patient with zofran. Discussed with patient to closely monitor symptoms and if symptoms are to worsen or change to report back  to the ED - strict return instructions given.  Patient agreed to plan of care, understood, all questions answered.   Jamse Mead, PA-C 08/06/13 1119

## 2013-08-05 NOTE — Discharge Instructions (Signed)
Please call your doctor for a followup appointment within 24-48 hours. When you talk to your doctor please let them know that you were seen in the emergency department and have them acquire all of your records so that they can discuss the findings with you and formulate a treatment plan to fully care for your new and ongoing problems. Please call and set-up an appointment with your primary care provider to be re-assessed on Monday  Please rest and stay hydrated Please drink plenty of fluids Please continue to take Tylenol no more than 4,000 mg/ 4 grams per day for this can lead to overdose and \ Please take zofran as needed for nausea control Please stay away from foods high in fat and grease for this can lead to worse pain and discomfort Please continue to monitor symptoms and if symptoms are to worsen or change (fever greater than 101, chills, neck pain, neck stiffness, chest pain, shortness of breath, difficulty breathing, neck pain, neck stiffness, stomach pain, inability to keep food or fluids down, black tarry stools, bloody stools) please report back to the ED immediately  Viral Infections A virus is a type of germ. Viruses can cause:  Minor sore throats.  Aches and pains.  Headaches.  Runny nose.  Rashes.  Watery eyes.  Tiredness.  Coughs.  Loss of appetite.  Feeling sick to your stomach (nausea).  Throwing up (vomiting).  Watery poop (diarrhea). HOME CARE   Only take medicines as told by your doctor.  Drink enough water and fluids to keep your pee (urine) clear or pale yellow. Sports drinks are a good choice.  Get plenty of rest and eat healthy. Soups and broths with crackers or rice are fine. GET HELP RIGHT AWAY IF:   You have a very bad headache.  You have shortness of breath.  You have chest pain or neck pain.  You have an unusual rash.  You cannot stop throwing up.  You have watery poop that does not stop.  You cannot keep fluids down.  You or  your child has a temperature by mouth above 102 F (38.9 C), not controlled by medicine.  Your baby is older than 3 months with a rectal temperature of 102 F (38.9 C) or higher.  Your baby is 76 months old or younger with a rectal temperature of 100.4 F (38 C) or higher. MAKE SURE YOU:   Understand these instructions.  Will watch this condition.  Will get help right away if you are not doing well or get worse. Document Released: 06/04/2008 Document Revised: 09/14/2011 Document Reviewed: 10/28/2010 [facility] Patient Information 2014 [place], [place].   Emergency Department Resource Guide 1) Find a Doctor and Pay Out of Pocket Although you won't have to find out who is covered by your insurance plan, it is a good idea to ask around and get recommendations. You will then need to call the office and see if the doctor you have chosen will accept you as a new patient and what types of options they offer for patients who are self-pay. Some doctors offer discounts or will set up payment plans for their patients who do not have insurance, but you will need to ask so you aren't surprised when you get to your appointment.  2) Contact Your Local Health Department Not all health departments have doctors that can see patients for sick visits, but many do, so it is worth a call to see if yours does. If you don't know where your local  health department is, you can check in your phone book. The CDC also has a tool to help you locate your state's health department, and many state websites also have listings of all of their local health departments.  3) Find a [facility] If your illness is not likely to be very severe or complicated, you may want to try a walk in clinic. These are popping up all over the country in pharmacies, drugstores, and shopping centers. They're usually staffed by nurse practitioners or physician assistants that have been trained to treat common illnesses and complaints. They're  usually fairly quick and inexpensive. However, if you have serious medical issues or chronic medical problems, these are probably not your best option.  No Primary Care Doctor: - Call Health Connect at  [phone] - they can help you locate a primary care doctor that  accepts your insurance, provides certain services, etc. - Physician Referral Service- [phone]  Chronic Pain Problems: Organization         Address  Phone   Notes  [facility]  [phone] Patients need to be referred by their primary care doctor.   Medication Assistance: Organization         Address  Phone   Notes  [facility] Medication [facility] [place] [place], [place] [address] [phone] --Must be a resident of [facility] -- Must have NO insurance coverage whatsoever (no Medicaid/ Medicare, etc.) -- The pt. MUST have a primary care doctor that directs their care regularly and follows them in the community   Med[facility]ist  [phone]   [employer]  [phone]    Agencies that provide inexpensive medical care: Organization         Address  Phone   Notes  [place]  [phone]   [facility]    [phone]   [facility] [place], [place] [address] [phone]   [place] [address], [place] [phone]   Planned Parenthood    [phone]   [facility]    [phone]   [place] and [place] [address], [place] Phone:  [phone], Fax:  [phone] Hours of Operation:  9 am - 6 pm, M-F.  Also accepts Medicaid/Medicare and self-pay.  [facility] for [place] [address], [address], [place] Phone: [phone], Fax: [phone]. Hours of Operation:  8:30 am - 5:30 pm, M-F.  Also accepts Medicaid and self-pay.  [facility] [place] [address], [place] Phone:  [phone]   [place], [place], [place] [phone], Ext. [phone] Mondays & Thursdays: 7-9 AM.  First 15 patients are seen on a first come, first serve basis.    [place] Providers:  Organization         Address  Phone   Notes  [facility] [address], [place] [phone] Also accepts self-pay patients.  [facility], [place]  [phone]   [place], [address], [place] [phone]   [facility] [address], [place] [phone]   Lucianne Lei [address], [place]   [phone] Only accepts [place] Access [place] patients after they  have their name applied to their card.   Self-Pay (no insurance) in [facility]:  Organization         Address  Phone   Notes  Sickle Cell Patients, [facility] [place] [phone]   [facility] [place] [phone]   [facility] [place]  [place], [place], [place] [phone]   [facility]/Dr. Osei-Bonsu  [address] [place] or [address], [place] [phone] Phone number for both [place] and [place] locations is the same.  Urgent Medical and [facility] [address] [place] [phone]   [facility] [address], [place] or [address] [phone]   [facility] [address], [place] [phone], phone; [phone], fax Sees patients 1st and 3rd Saturday of every month.  Must not qualify for public or private insurance (i.e. Medicaid, Medicare, [place] Health Choice, Veterans' Benefits)  Household income should be no more than 200% of the poverty level The clinic cannot treat  you if you are pregnant or think you are pregnant  Sexually transmitted diseases are not treated at the clinic.    Dental Care: Organization         Address  Phone  Notes  [facility] Department of [facility] [place] [phone] Accepts children up to age 83 who are enrolled in [place] or [place]; pregnant women with a Medicaid card; and children who have applied for Medicaid or [place] Health Choice, but were declined, whose parents can pay a reduced fee at time of service.  [facility] Department of [facility]  [address], [place] [phone] Accepts children up to age 66 who are enrolled in [place] or [place]; pregnant women with a Medicaid card; and children who have applied for Medicaid or [place] Health Choice, but were declined, whose parents can pay a reduced fee at time of service.  [place] Adult Dental Access PROGRAM  [place] [phone] Patients are seen by appointment only. Walk-ins are not accepted. [place] will see patients 35 years of age and older. Monday - Tuesday (8am-5pm) Most Wednesdays (8:30-5pm) $30 per visit, cash only  [facility] Adult Dental Access PROGRAM  [address], [facility] [phone] Patients are seen by appointment only. Walk-ins are not accepted. [place] will see patients 24 years of age and older. One Wednesday Evening (Monthly: Volunteer Based).  $30 per visit, cash only  [place]  [phone] for adults; Children under age 43, call Graduate Pediatric Dentistry at [phone]. Children aged 16-14, please call [phone] to request a pediatric application.  Dental services are provided in all areas of dental care including fillings, crowns and bridges, complete and partial dentures, implants, gum treatment, root canals, and extractions. Preventive care is also provided. Treatment  is provided to both adults and children. Patients are selected via a lottery and there is often a waiting list.   [facility] [address], [place]  [phone] [email or web address], [place], [place] [phone], Ext. [phone] Second and Fourth Thursday of each month, opens at 6:30 AM; Clinic ends at 9 AM.  Patients are seen  on a first-come first-served basis, and a limited number are seen during each clinic.   [facility]  [address] Hillard Danker [place], [place] [phone]   Eligibility Requirements You must have lived in [place], [place], or [place] counties for at least the last three months.   You cannot be eligible for state or federal sponsored [employer], including [employer], [place], or [employer].   You generally cannot be eligible for healthcare insurance through your employer.    How to apply: Eligibility screenings are held every Tuesday and Wednesday afternoon from 1:00 pm until 4:00 pm. You do not need an appointment for the interview!  [facility] [address], [place], [place]   [place]  [place] Department  [place]  [phone]    Behavioral Health Resources in the Community: Intensive Outpatient Programs Organization         Address  Phone  Notes  [place]. [address] [place], [place] [phone]   [facility] Outpatient [address], [place], [place]   ADS: Alcohol & Drug Svcs [address], [place], [place]   [place] [address]  [place], [place] or [phone]   Substance Abuse Resources Organization         Address  Phone  Notes  Alcohol and Drug Services  [phone]   [place]  [phone]   The  [place]   Chinita Pester  [phone]   Residential & Outpatient Substance Abuse Program  [phone]   Psychological Services Organization         Address  Phone  Notes  [facility] [place]  [place]  [phone]   [place] [address] [place] or [phone]    Mobile Crisis Teams Organization         Address  Phone  Notes  [facility], Mobile Crisis Care Unit  [phone]   Assertive Psychotherapeutic Services  [address] [place], [place]   [place] [address] [place] [phone]    Self-Help/Support Groups Organization         Address  Phone             Notes  [place] - variety of support groups  [place] Call for more information  Narcotics Anonymous (NA), [facility] [address], [employer] [place]  2 meetings at this location   Special educational needs teacher         Address  Phone  Notes  [facility] Residential Treatment [place],    [employer]  [phone]   [facility]  [address], [place] [id number], [place], [place]   [place], [place] [phone] Admissions: 8am-3pm M-F  Incentives Substance [place] [address],    [place], [place] [phone]   [facility] [address] Jadene Pierini [place], [place]   [facility] [address],  [place], [place]   Insight Programs - Intensive Outpatient [address] Kristeen Mans [address], [place], [place]   [facility] [place].,  [place], [place] or [phone]   Residential Treatment Services (RTS) [place], [place]  [place] Medicaid  [facility] [address],  [place] [phone] Substance Abuse/Addiction Treatment     [facility] Organization         Address  Phone  Notes  [facility]  [phone]   Domenic Schwab, PhD [address] Arlis Porta [place], [place]   [phone] or [phone]   [place], [place] [phone]   [address], [place], [place] [phone] Insurance/Medicaid/sponsorship through [facility] and Families [address] [place]                                    [place], [place] [phone] [place] [address], [place] [phone]    Dr. Adele Schilder  [phone]   [facility] Dept. 1) [address], [place] 2) [place] 3)  [place] [address], [place] [phone]  [phone]   [place] [phone] or [phone] (After Hours)

## 2013-08-06 NOTE — ED Provider Notes (Signed)
Medical screening examination/treatment/procedure(s) were performed by non-physician practitioner and as supervising physician I was immediately available for consultation/collaboration.  EKG Interpretation   None         Ephraim Hamburger, MD 08/06/13 1413

## 2013-08-07 LAB — CULTURE, GROUP A STREP

## 2014-03-24 ENCOUNTER — Encounter (HOSPITAL_COMMUNITY): Payer: Self-pay | Admitting: Emergency Medicine

## 2014-03-24 ENCOUNTER — Emergency Department (HOSPITAL_COMMUNITY)
Admission: EM | Admit: 2014-03-24 | Discharge: 2014-03-24 | Disposition: A | Discharge status: Home / Self Care | Payer: 59 | Attending: Emergency Medicine | Admitting: Emergency Medicine

## 2014-03-24 DIAGNOSIS — I10 Essential (primary) hypertension: Secondary | ICD-10-CM | POA: Insufficient documentation

## 2014-03-24 DIAGNOSIS — N289 Disorder of kidney and ureter, unspecified: Secondary | ICD-10-CM

## 2014-03-24 DIAGNOSIS — R112 Nausea with vomiting, unspecified: Secondary | ICD-10-CM | POA: Insufficient documentation

## 2014-03-24 DIAGNOSIS — J029 Acute pharyngitis, unspecified: Secondary | ICD-10-CM | POA: Insufficient documentation

## 2014-03-24 DIAGNOSIS — N390 Urinary tract infection, site not specified: Secondary | ICD-10-CM | POA: Diagnosis not present

## 2014-03-24 DIAGNOSIS — R51 Headache: Secondary | ICD-10-CM | POA: Insufficient documentation

## 2014-03-24 DIAGNOSIS — Z79899 Other long term (current) drug therapy: Secondary | ICD-10-CM | POA: Insufficient documentation

## 2014-03-24 LAB — COMPREHENSIVE METABOLIC PANEL
ALK PHOS: 88 U/L (ref 39–117)
ALT: 36 U/L — AB (ref 0–35)
AST: 36 U/L (ref 0–37)
Albumin: 4.1 g/dL (ref 3.5–5.2)
Anion gap: 17 — ABNORMAL HIGH (ref 5–15)
BUN: 60 mg/dL — ABNORMAL HIGH (ref 6–23)
CO2: 13 meq/L — AB (ref 19–32)
Calcium: 9.5 mg/dL (ref 8.4–10.5)
Chloride: 111 mEq/L (ref 96–112)
Creatinine, Ser: 3.05 mg/dL — ABNORMAL HIGH (ref 0.50–1.10)
GFR calc Af Amer: 20 mL/min — ABNORMAL LOW (ref >90)
GFR, EST NON AFRICAN AMERICAN: 17 mL/min — AB (ref >90)
GLUCOSE: 117 mg/dL — AB (ref 70–99)
POTASSIUM: 6.2 meq/L — AB (ref 3.7–5.3)
SODIUM: 141 meq/L (ref 137–147)
Total Bilirubin: 0.4 mg/dL (ref 0.3–1.2)
Total Protein: 8.4 g/dL — ABNORMAL HIGH (ref 6.0–8.3)

## 2014-03-24 LAB — CBC WITH DIFFERENTIAL/PLATELET
Basophils Absolute: 0.1 10*3/uL (ref 0.0–0.1)
Basophils Relative: 1 % (ref 0–1)
Eosinophils Absolute: 0 10*3/uL (ref 0.0–0.7)
Eosinophils Relative: 0 % (ref 0–5)
HCT: 37.1 % (ref 36.0–46.0)
Hemoglobin: 12.2 g/dL (ref 12.0–15.0)
LYMPHS PCT: 16 % (ref 12–46)
Lymphs Abs: 1.7 10*3/uL (ref 0.7–4.0)
MCH: 31.9 pg (ref 26.0–34.0)
MCHC: 32.9 g/dL (ref 30.0–36.0)
MCV: 96.9 fL (ref 78.0–100.0)
Monocytes Absolute: 1 10*3/uL (ref 0.1–1.0)
Monocytes Relative: 10 % (ref 3–12)
NEUTROS PCT: 73 % (ref 43–77)
Neutro Abs: 7.8 10*3/uL — ABNORMAL HIGH (ref 1.7–7.7)
PLATELETS: 359 10*3/uL (ref 150–400)
RBC: 3.83 MIL/uL — AB (ref 3.87–5.11)
RDW: 13.7 % (ref 11.5–15.5)
WBC: 10.6 10*3/uL — AB (ref 4.0–10.5)

## 2014-03-24 LAB — BASIC METABOLIC PANEL
ANION GAP: 16 — AB (ref 5–15)
BUN: 58 mg/dL — ABNORMAL HIGH (ref 6–23)
CO2: 14 meq/L — AB (ref 19–32)
CREATININE: 2.93 mg/dL — AB (ref 0.50–1.10)
Calcium: 8.8 mg/dL (ref 8.4–10.5)
Chloride: 116 mEq/L — ABNORMAL HIGH (ref 96–112)
GFR calc Af Amer: 21 mL/min — ABNORMAL LOW (ref >90)
GFR calc non Af Amer: 18 mL/min — ABNORMAL LOW (ref >90)
Glucose, Bld: 98 mg/dL (ref 70–99)
Potassium: 4.9 mEq/L (ref 3.7–5.3)
SODIUM: 146 meq/L (ref 137–147)

## 2014-03-24 LAB — URINE MICROSCOPIC-ADD ON

## 2014-03-24 LAB — URINALYSIS, ROUTINE W REFLEX MICROSCOPIC
BILIRUBIN URINE: NEGATIVE
GLUCOSE, UA: NEGATIVE mg/dL
Ketones, ur: NEGATIVE mg/dL
Leukocytes, UA: NEGATIVE
Nitrite: NEGATIVE
Protein, ur: >300 mg/dL — AB
Specific Gravity, Urine: 1.019 (ref 1.005–1.030)
UROBILINOGEN UA: 0.2 mg/dL (ref 0.0–1.0)
pH: 5 (ref 5.0–8.0)

## 2014-03-24 LAB — RAPID STREP SCREEN (MED CTR MEBANE ONLY): Streptococcus, Group A Screen (Direct): NEGATIVE

## 2014-03-24 MED ORDER — CEPHALEXIN 500 MG PO CAPS: 500.0000 mg | ORAL_CAPSULE | Freq: Two times a day (BID) | ORAL | Status: DC

## 2014-03-24 MED ORDER — ONDANSETRON HCL 4 MG/2ML IJ SOLN
4.0000 mg | Freq: Once | INTRAMUSCULAR | Status: AC
  Administered 2014-03-24: 4 mg via INTRAVENOUS
  Filled 2014-03-24: qty 2

## 2014-03-24 MED ORDER — MORPHINE SULFATE 4 MG/ML IJ SOLN
4.0000 mg | Freq: Once | INTRAMUSCULAR | Status: AC
  Administered 2014-03-24: 4 mg via INTRAVENOUS
  Filled 2014-03-24: qty 1

## 2014-03-24 MED ORDER — SODIUM CHLORIDE 0.9 % IV BOLUS (SEPSIS)
1000.0000 mL | Freq: Once | INTRAVENOUS | Status: AC
  Administered 2014-03-24: 1000 mL via INTRAVENOUS

## 2014-03-24 NOTE — ED Notes (Signed)
Pt family waiting in waiting room to take pt home.

## 2014-03-24 NOTE — ED Notes (Signed)
Dr Pickering at bedside 

## 2014-03-24 NOTE — ED Notes (Signed)
Upon walking into the room, pt sleeping. Morphine given and NS infusing at this time.

## 2014-03-24 NOTE — ED Notes (Signed)
MD at bedside. 

## 2014-03-24 NOTE — Discharge Instructions (Signed)
Nausea and Vomiting °Nausea is a sick feeling that often comes before throwing up (vomiting). Vomiting is a reflex where stomach contents come out of your mouth. Vomiting can cause severe loss of body fluids (dehydration). Children and elderly adults can become dehydrated quickly, especially if they also have diarrhea. Nausea and vomiting are symptoms of a condition or disease. It is important to find the cause of your symptoms. °CAUSES  °· Direct irritation of the stomach lining. This irritation can result from increased acid production (gastroesophageal reflux disease), infection, food poisoning, taking certain medicines (such as nonsteroidal anti-inflammatory drugs), alcohol use, or tobacco use. °· Signals from the brain. These signals could be caused by a headache, heat exposure, an inner ear disturbance, increased pressure in the brain from injury, infection, a tumor, or a concussion, pain, emotional stimulus, or metabolic problems. °· An obstruction in the gastrointestinal tract (bowel obstruction). °· Illnesses such as diabetes, hepatitis, gallbladder problems, appendicitis, kidney problems, cancer, sepsis, atypical symptoms of a heart attack, or eating disorders. °· Medical treatments such as chemotherapy and radiation. °· Receiving medicine that makes you sleep (general anesthetic) during surgery. °DIAGNOSIS °Your caregiver may ask for tests to be done if the problems do not improve after a few days. Tests may also be done if symptoms are severe or if the reason for the nausea and vomiting is not clear. Tests may include: °· Urine tests. °· Blood tests. °· Stool tests. °· Cultures (to look for evidence of infection). °· X-rays or other imaging studies. °Test results can help your caregiver make decisions about treatment or the need for additional tests. °TREATMENT °You need to stay well hydrated. Drink frequently but in small amounts. You may wish to drink water, sports drinks, clear broth, or eat frozen  ice pops or gelatin dessert to help stay hydrated. When you eat, eating slowly may help prevent nausea. There are also some antinausea medicines that may help prevent nausea. °HOME CARE INSTRUCTIONS  °· Take all medicine as directed by your caregiver. °· If you do not have an appetite, do not force yourself to eat. However, you must continue to drink fluids. °· If you have an appetite, eat a normal diet unless your caregiver tells you differently. °¨ Eat a variety of complex carbohydrates (rice, wheat, potatoes, bread), lean meats, yogurt, fruits, and vegetables. °¨ Avoid high-fat foods because they are more difficult to digest. °· Drink enough water and fluids to keep your urine clear or pale yellow. °· If you are dehydrated, ask your caregiver for specific rehydration instructions. Signs of dehydration may include: °¨ Severe thirst. °¨ Dry lips and mouth. °¨ Dizziness. °¨ Dark urine. °¨ Decreasing urine frequency and amount. °¨ Confusion. °¨ Rapid breathing or pulse. °SEEK IMMEDIATE MEDICAL CARE IF:  °· You have blood or brown flecks (like coffee grounds) in your vomit. °· You have black or bloody stools. °· You have a severe headache or stiff neck. °· You are confused. °· You have severe abdominal pain. °· You have chest pain or trouble breathing. °· You do not urinate at least once every 8 hours. °· You develop cold or clammy skin. °· You continue to vomit for longer than 24 to 48 hours. °· You have a fever. °MAKE SURE YOU:  °· Understand these instructions. °· Will watch your condition. °· Will get help right away if you are not doing well or get worse. °Document Released: 06/22/2005 Document Revised: 09/14/2011 Document Reviewed: 11/19/2010 °ExitCare® Patient Information ©2015 [employer]. This information is not intended   to replace advice given to you by your health care provider. Make sure you discuss any questions you have with your health care provider.  Urinary Tract Infection Urinary tract  infections (UTIs) can develop anywhere along your urinary tract. Your urinary tract is your body's drainage system for removing wastes and extra water. Your urinary tract includes two kidneys, two ureters, a bladder, and a urethra. Your kidneys are a pair of bean-shaped organs. Each kidney is about the size of your fist. They are located below your ribs, one on each side of your spine. CAUSES Infections are caused by microbes, which are microscopic organisms, including fungi, viruses, and bacteria. These organisms are so small that they can only be seen through a microscope. Bacteria are the microbes that most commonly cause UTIs. SYMPTOMS  Symptoms of UTIs may vary by age and gender of the patient and by the location of the infection. Symptoms in young women typically include a frequent and intense urge to urinate and a painful, burning feeling in the bladder or urethra during urination. Older women and men are more likely to be tired, shaky, and weak and have muscle aches and abdominal pain. A fever may mean the infection is in your kidneys. Other symptoms of a kidney infection include pain in your back or sides below the ribs, nausea, and vomiting. DIAGNOSIS To diagnose a UTI, your caregiver will ask you about your symptoms. Your caregiver also will ask to provide a urine sample. The urine sample will be tested for bacteria and white blood cells. White blood cells are made by your body to help fight infection. TREATMENT  Typically, UTIs can be treated with medication. Because most UTIs are caused by a bacterial infection, they usually can be treated with the use of antibiotics. The choice of antibiotic and length of treatment depend on your symptoms and the type of bacteria causing your infection. HOME CARE INSTRUCTIONS  If you were prescribed antibiotics, take them exactly as your caregiver instructs you. Finish the medication even if you feel better after you have only taken some of the  medication.  Drink enough water and fluids to keep your urine clear or pale yellow.  Avoid caffeine, tea, and carbonated beverages. They tend to irritate your bladder.  Empty your bladder often. Avoid holding urine for long periods of time.  Empty your bladder before and after sexual intercourse.  After a bowel movement, women should cleanse from front to back. Use each tissue only once. SEEK MEDICAL CARE IF:   You have back pain.  You develop a fever.  Your symptoms do not begin to resolve within 3 days. SEEK IMMEDIATE MEDICAL CARE IF:   You have severe back pain or lower abdominal pain.  You develop chills.  You have nausea or vomiting.  You have continued burning or discomfort with urination. MAKE SURE YOU:   Understand these instructions.  Will watch your condition.  Will get help right away if you are not doing well or get worse. Document Released: 04/01/2005 Document Revised: 12/22/2011 Document Reviewed: 07/31/2011 [facility] Patient Information 2015 [place], [place]. This information is not intended to replace advice given to you by your health care provider. Make sure you discuss any questions you have with your health care provider.

## 2014-03-24 NOTE — ED Provider Notes (Signed)
CSN: [id number]     Arrival date & time 03/24/14  1008 History   First MD Initiated Contact with Patient 03/24/14 1112     Chief Complaint  Patient presents with  . Emesis     (Consider location/radiation/quality/duration/timing/severity/associated sxs/prior Treatment) Patient is a 48 y.o. female presenting with vomiting. The history is provided by the patient.  Emesis Associated symptoms: headaches   Associated symptoms: no abdominal pain and no diarrhea    patient's had nausea and vomiting for the last 2 days. No diarrhea. Mild abdominal pain. She's complaining more of a headache and sore throat. No fevers, but states she's had chills. She's she's had frequency, but not dysuria. She states she has a decreased appetite. No sick contacts. No numbness or weakness. No vision changes. Patient states she has chronic kidney problems. Her creatinine is around 3. She sees Dr. [name] for this.  Past Medical History  Diagnosis Date  . Hypertension    History reviewed. No pertinent past surgical history. History reviewed. No pertinent family history. History  Substance Use Topics  . Smoking status: Never Smoker   . Smokeless tobacco: Not on file  . Alcohol Use: Yes   OB History   Grav Para Term Preterm Abortions TAB SAB Ect Mult Living                 Review of Systems  Constitutional: Negative for activity change and appetite change.  Eyes: Negative for pain.  Respiratory: Negative for chest tightness and shortness of breath.   Cardiovascular: Negative for chest pain and leg swelling.  Gastrointestinal: Positive for nausea and vomiting. Negative for abdominal pain and diarrhea.  Genitourinary: Positive for frequency. Negative for flank pain.  Musculoskeletal: Negative for back pain and neck stiffness.  Skin: Negative for rash.  Neurological: Positive for headaches. Negative for weakness and numbness.  Psychiatric/Behavioral: Negative for behavioral problems.       Allergies  Review of patient's allergies indicates no known allergies.  Home Medications   Prior to Admission medications   Medication Sig Start Date End Date Taking? Authorizing Provider  amLODipine (NORVASC) 10 MG tablet Take 10 mg by mouth daily.   Yes Historical Provider, MD  atorvastatin (LIPITOR) 20 MG tablet Take 20 mg by mouth daily.   Yes Historical Provider, MD  furosemide (LASIX) 40 MG tablet Take 20 mg by mouth daily. 01/07/14  Yes Historical Provider, MD  nebivolol (BYSTOLIC) 10 MG tablet Take 10 mg by mouth daily.   Yes Historical Provider, MD  telmisartan (MICARDIS) 40 MG tablet Take 40 mg by mouth daily.   Yes Historical Provider, MD  cephALEXin (KEFLEX) 500 MG capsule Take 1 capsule (500 mg total) by mouth 2 (two) times daily. 03/24/14   Jasper Riling. Teddy Rebstock, MD   BP 161/81  Pulse 78  Temp(Src) 98 F (36.7 C) (Oral)  Resp 14  Ht 5\' 5"  (1.651 m)  Wt 127 lb (57.607 kg)  BMI 21.13 kg/m2  SpO2 100% Physical Exam  Nursing note and vitals reviewed. Constitutional: She is oriented to person, place, and time. She appears well-developed and well-nourished.  HENT:  Head: Normocephalic and atraumatic.  Eyes: EOM are normal. Pupils are equal, round, and reactive to light.  Neck: Normal range of motion. Neck supple.  Cardiovascular: Normal rate, regular rhythm and normal heart sounds.   No murmur heard. Pulmonary/Chest: Effort normal and breath sounds normal. No respiratory distress. She has no wheezes. She has no rales.  Abdominal: Soft. Bowel sounds are normal.  She exhibits no distension. There is no tenderness. There is no rebound and no guarding.  Musculoskeletal: Normal range of motion.  Neurological: She is alert and oriented to person, place, and time. No cranial nerve deficit.  Skin: Skin is warm and dry.  Psychiatric: She has a normal mood and affect. Her speech is normal.    ED Course  Procedures (including critical care time) Labs Review Labs Reviewed   CBC WITH DIFFERENTIAL - Abnormal; Notable for the following:    WBC 10.6 (*)    RBC 3.83 (*)    Neutro Abs 7.8 (*)    All other components within normal limits  COMPREHENSIVE METABOLIC PANEL - Abnormal; Notable for the following:    Potassium 6.2 (*)    CO2 13 (*)    Glucose, Bld 117 (*)    BUN 60 (*)    Creatinine, Ser 3.05 (*)    Total Protein 8.4 (*)    ALT 36 (*)    GFR calc non Af Amer 17 (*)    GFR calc Af Amer 20 (*)    Anion gap 17 (*)    All other components within normal limits  URINALYSIS, ROUTINE W REFLEX MICROSCOPIC - Abnormal; Notable for the following:    Hgb urine dipstick SMALL (*)    Protein, ur >300 (*)    All other components within normal limits  BASIC METABOLIC PANEL - Abnormal; Notable for the following:    Chloride 116 (*)    CO2 14 (*)    BUN 58 (*)    Creatinine, Ser 2.93 (*)    GFR calc non Af Amer 18 (*)    GFR calc Af Amer 21 (*)    Anion gap 16 (*)    All other components within normal limits  URINE MICROSCOPIC-ADD ON - Abnormal; Notable for the following:    Squamous Epithelial / LPF MANY (*)    Bacteria, UA MANY (*)    All other components within normal limits  RAPID STREP SCREEN  CULTURE, GROUP A STREP  URINE CULTURE    Imaging Review No results found.   EKG Interpretation None      MDM   Final diagnoses:  UTI (lower urinary tract infection)  Non-intractable vomiting with nausea, vomiting of unspecified type  Renal insufficiency    Patient with nausea vomiting. Feels better after IV fluids. Urinalysis shows possible infection. Creatinine is elevated and bicarbonate is low. This is similar to her last visit and reportedly where she has been. Reviewed prior notes. Will discharge home with nausea medicines and pain medicines. Negative strep. Doubt intracranial problem as the cause. Potassium was hemolyzed and on recheck was normal.    Jasper Riling. Alvino Chapel, MD 03/24/14 1539

## 2014-03-24 NOTE — ED Notes (Signed)
Pt given 4 of zofran. Fluids infusing at this time. Will monitor.

## 2014-03-24 NOTE — ED Notes (Signed)
Pt states she began getting a sore throat and headache on Thursday, she thought it was a sinus infection, however began feeling nauseated and started vomiting Thursday night. Pt denies diarrhea. Bowel sounds audible in all quadrants. Pt denies tenderness upon palpation. Pt in gown.

## 2014-03-24 NOTE — ED Notes (Signed)
She reports headaches, ear aches and sore throat x 3 days. Onset vomiting since last night. Reports unable to tolerate any oral intake

## 2014-03-26 LAB — URINE CULTURE

## 2014-03-26 LAB — CULTURE, GROUP A STREP

## 2014-05-02 ENCOUNTER — Other Ambulatory Visit: Payer: Self-pay | Admitting: Internal Medicine

## 2014-05-02 DIAGNOSIS — R1903 Right lower quadrant abdominal swelling, mass and lump: Secondary | ICD-10-CM

## 2014-05-04 ENCOUNTER — Other Ambulatory Visit: Payer: 59

## 2014-05-12 ENCOUNTER — Other Ambulatory Visit (HOSPITAL_COMMUNITY): Payer: Self-pay | Admitting: Nephrology

## 2014-05-15 ENCOUNTER — Emergency Department (HOSPITAL_COMMUNITY): Payer: 59

## 2014-05-15 ENCOUNTER — Encounter (HOSPITAL_COMMUNITY): Payer: Self-pay | Admitting: Emergency Medicine

## 2014-05-15 ENCOUNTER — Emergency Department (HOSPITAL_COMMUNITY)
Admission: EM | Admit: 2014-05-15 | Discharge: 2014-05-15 | Discharge status: Against Medical Advice | Payer: 59 | Attending: Emergency Medicine | Admitting: Emergency Medicine

## 2014-05-15 DIAGNOSIS — Z79899 Other long term (current) drug therapy: Secondary | ICD-10-CM | POA: Diagnosis not present

## 2014-05-15 DIAGNOSIS — I10 Essential (primary) hypertension: Secondary | ICD-10-CM | POA: Diagnosis not present

## 2014-05-15 DIAGNOSIS — E875 Hyperkalemia: Secondary | ICD-10-CM | POA: Insufficient documentation

## 2014-05-15 DIAGNOSIS — R1900 Intra-abdominal and pelvic swelling, mass and lump, unspecified site: Secondary | ICD-10-CM | POA: Diagnosis not present

## 2014-05-15 DIAGNOSIS — Z792 Long term (current) use of antibiotics: Secondary | ICD-10-CM | POA: Insufficient documentation

## 2014-05-15 DIAGNOSIS — Z87448 Personal history of other diseases of urinary system: Secondary | ICD-10-CM | POA: Insufficient documentation

## 2014-05-15 DIAGNOSIS — R109 Unspecified abdominal pain: Secondary | ICD-10-CM | POA: Diagnosis present

## 2014-05-15 LAB — COMPREHENSIVE METABOLIC PANEL
ALBUMIN: 4 g/dL (ref 3.5–5.2)
ALT: 6 U/L (ref 0–35)
ANION GAP: 14 (ref 5–15)
AST: 18 U/L (ref 0–37)
Alkaline Phosphatase: 80 U/L (ref 39–117)
BILIRUBIN TOTAL: 0.2 mg/dL — AB (ref 0.3–1.2)
BUN: 68 mg/dL — AB (ref 6–23)
CHLORIDE: 107 meq/L (ref 96–112)
CO2: 20 mEq/L (ref 19–32)
Calcium: 9.6 mg/dL (ref 8.4–10.5)
Creatinine, Ser: 3.22 mg/dL — ABNORMAL HIGH (ref 0.50–1.10)
GFR calc non Af Amer: 16 mL/min — ABNORMAL LOW (ref >90)
GFR, EST AFRICAN AMERICAN: 18 mL/min — AB (ref >90)
Glucose, Bld: 89 mg/dL (ref 70–99)
POTASSIUM: 5.5 meq/L — AB (ref 3.7–5.3)
SODIUM: 141 meq/L (ref 137–147)
TOTAL PROTEIN: 7.7 g/dL (ref 6.0–8.3)

## 2014-05-15 LAB — CBC WITH DIFFERENTIAL/PLATELET
Basophils Absolute: 0.1 10*3/uL (ref 0.0–0.1)
Basophils Relative: 1 % (ref 0–1)
EOS ABS: 0.2 10*3/uL (ref 0.0–0.7)
Eosinophils Relative: 2 % (ref 0–5)
HCT: 31.6 % — ABNORMAL LOW (ref 36.0–46.0)
Hemoglobin: 10.1 g/dL — ABNORMAL LOW (ref 12.0–15.0)
LYMPHS ABS: 1.9 10*3/uL (ref 0.7–4.0)
Lymphocytes Relative: 25 % (ref 12–46)
MCH: 31.4 pg (ref 26.0–34.0)
MCHC: 32 g/dL (ref 30.0–36.0)
MCV: 98.1 fL (ref 78.0–100.0)
MONOS PCT: 9 % (ref 3–12)
Monocytes Absolute: 0.7 10*3/uL (ref 0.1–1.0)
NEUTROS ABS: 4.8 10*3/uL (ref 1.7–7.7)
NEUTROS PCT: 63 % (ref 43–77)
PLATELETS: 296 10*3/uL (ref 150–400)
RBC: 3.22 MIL/uL — AB (ref 3.87–5.11)
RDW: 12.3 % (ref 11.5–15.5)
WBC: 7.6 10*3/uL (ref 4.0–10.5)

## 2014-05-15 LAB — URINE MICROSCOPIC-ADD ON

## 2014-05-15 LAB — LIPASE, BLOOD: Lipase: 130 U/L — ABNORMAL HIGH (ref 11–59)

## 2014-05-15 LAB — URINALYSIS, ROUTINE W REFLEX MICROSCOPIC
BILIRUBIN URINE: NEGATIVE
GLUCOSE, UA: NEGATIVE mg/dL
Ketones, ur: NEGATIVE mg/dL
Leukocytes, UA: NEGATIVE
Nitrite: NEGATIVE
Protein, ur: 100 mg/dL — AB
SPECIFIC GRAVITY, URINE: 1.013 (ref 1.005–1.030)
Urobilinogen, UA: 0.2 mg/dL (ref 0.0–1.0)
pH: 5 (ref 5.0–8.0)

## 2014-05-15 MED ORDER — SODIUM POLYSTYRENE SULFONATE 15 GM/60ML PO SUSP: 30.0000 g | Freq: Once | ORAL | Status: DC

## 2014-05-15 NOTE — ED Notes (Signed)
Pt c.o abd pain x 2 weeks, states he doctor found a mass on right kidney and she feels as if it is getting worse. Denies n/v.

## 2014-05-15 NOTE — ED Provider Notes (Signed)
CSN: [id number]     Arrival date & time 05/15/14  1448 History   First MD Initiated Contact with Patient 05/15/14 1755     Chief Complaint  Patient presents with  . Abdominal Pain     (Consider location/radiation/quality/duration/timing/severity/associated sxs/prior Treatment) HPI  Terry Juarez is a 48 y.o. female with PMH of hypertension, kidney disease presenting with 2 weeks of intermittent sharp right sided flank and abdominal pain. Pain worse with standing and walking.No pain now. No nausea, vomiting, diarrhea, blood in stool. No hematuria or urinary symptoms. Patient states she saw her doctor 1 week ago and had a mass on her right kidney found by ultrasound. Dr. [name] No fevers, chills, recent illness, chest pain, SOB, back pain or weakness.   Past Medical History  Diagnosis Date  . Hypertension   . Renal disorder    History reviewed. No pertinent past surgical history. No family history on file. History  Substance Use Topics  . Smoking status: Never Smoker   . Smokeless tobacco: Not on file  . Alcohol Use: Yes   OB History    No data available     Review of Systems  Constitutional: Negative for fever and chills.  HENT: Negative for congestion and rhinorrhea.   Eyes: Negative for visual disturbance.  Respiratory: Negative for cough and shortness of breath.   Cardiovascular: Negative for chest pain and palpitations.  Gastrointestinal: Positive for abdominal pain. Negative for nausea, vomiting and diarrhea.  Genitourinary: Negative for dysuria and hematuria.  Musculoskeletal: Negative for back pain and gait problem.  Skin: Negative for rash.  Neurological: Negative for weakness and headaches.      Allergies  Review of patient's allergies indicates no known allergies.  Home Medications   Prior to Admission medications   Medication Sig Start Date End Date Taking? Authorizing Provider  amLODipine (NORVASC) 10 MG tablet Take 10 mg by mouth daily.    Yes Historical Provider, MD  atorvastatin (LIPITOR) 20 MG tablet Take 20 mg by mouth daily.   Yes Historical Provider, MD  cholecalciferol (VITAMIN D) 1000 UNITS tablet Take 1,000 Units by mouth 3 (three) times a week. Every Monday, Wednesday, and Friday   Yes Historical Provider, MD  furosemide (LASIX) 40 MG tablet Take 20 mg by mouth daily. 01/07/14  Yes Historical Provider, MD  nebivolol (BYSTOLIC) 10 MG tablet Take 10 mg by mouth daily.   Yes Historical Provider, MD  cephALEXin (KEFLEX) 500 MG capsule Take 1 capsule (500 mg total) by mouth 2 (two) times daily. 03/24/14   Jasper Riling. Pickering, MD  telmisartan (MICARDIS) 40 MG tablet Take 40 mg by mouth daily.    Historical Provider, MD   BP 164/95 mmHg  Pulse 64  Temp(Src) 97.8 F (36.6 C) (Oral)  Resp 16  SpO2 94% Physical Exam  Constitutional: She appears well-developed and well-nourished. No distress.  HENT:  Head: Normocephalic and atraumatic.  Eyes: Conjunctivae and EOM are normal. Right eye exhibits no discharge. Left eye exhibits no discharge.  Cardiovascular: Normal rate, regular rhythm and normal heart sounds.   Pulmonary/Chest: Effort normal and breath sounds normal. No respiratory distress. She has no wheezes.  Abdominal: Soft. Bowel sounds are normal. She exhibits no distension. There is no tenderness.  Neurological: She is alert. She exhibits normal muscle tone. Coordination normal.  Skin: Skin is warm and dry. She is not diaphoretic.  Nursing note and vitals reviewed.   ED Course  Procedures (including critical care time) Labs Review Labs Reviewed  CBC WITH DIFFERENTIAL - Abnormal; Notable for the following:    RBC 3.22 (*)    Hemoglobin 10.1 (*)    HCT 31.6 (*)    All other components within normal limits  COMPREHENSIVE METABOLIC PANEL - Abnormal; Notable for the following:    Potassium 5.5 (*)    BUN 68 (*)    Creatinine, Ser 3.22 (*)    Total Bilirubin 0.2 (*)    GFR calc non Af Amer 16 (*)    GFR calc Af  Amer 18 (*)    All other components within normal limits  LIPASE, BLOOD - Abnormal; Notable for the following:    Lipase 130 (*)    All other components within normal limits  URINALYSIS, ROUTINE W REFLEX MICROSCOPIC - Abnormal; Notable for the following:    Hgb urine dipstick SMALL (*)    Protein, ur 100 (*)    All other components within normal limits  URINE MICROSCOPIC-ADD ON - Abnormal; Notable for the following:    Casts HYALINE CASTS (*)    All other components within normal limits    Imaging Review No results found.   EKG Interpretation None      .  MDM   Final diagnoses:  Abdominal mass   Patient presents with intermittent abdominal pain is sharp and going on for 2 weeks. HTN to 164/95 vitals stable. Patient with benign abdomen no CVA tenderness. Patient found to have hyperkalemia to 5.5. Also elevation in her creatinine from prior studies in September. Consult to nephrology and spoke with Dr. Marval Regal at [facility] who agreed with plan for kayexalate for hyperkalemia and recommended CT abdomen for further characterization of mass. He stated that creatine was 4 at her appointment last week and was not concerned. US showed solid mass between right kidney and liver. CT abdomen pelvis without contrast pending. Nurse informed me that pt leaving AMA and signed papers. She was gone when I went in to speak with her. Earlier we had discussed following up with her PCP or kidney physician. Pt voiced understanding and was agreeable to that plan at that time.  Discussed all results and patient verbalizes understanding and agrees with plan.  Case has been discussed with Dr. Eulis Foster who agrees with the above plan.       Pura Spice, PA-C 05/16/14 [place], MD 05/17/14 [phone]

## 2014-05-15 NOTE — ED Notes (Signed)
Called by NT that pt is leaving.  Did not want to stay.  Stated that "we were doing nothing for her".  Pt did not receive med prior to leaving.  Notified PA

## 2014-05-16 ENCOUNTER — Emergency Department (HOSPITAL_COMMUNITY)
Admission: EM | Admit: 2014-05-16 | Discharge: 2014-05-16 | Disposition: A | Discharge status: Home / Self Care | Payer: 59 | Attending: Emergency Medicine | Admitting: Emergency Medicine

## 2014-05-16 ENCOUNTER — Emergency Department (HOSPITAL_COMMUNITY): Payer: 59

## 2014-05-16 ENCOUNTER — Encounter (HOSPITAL_COMMUNITY): Payer: Self-pay | Admitting: Emergency Medicine

## 2014-05-16 DIAGNOSIS — Z79899 Other long term (current) drug therapy: Secondary | ICD-10-CM | POA: Diagnosis not present

## 2014-05-16 DIAGNOSIS — N2889 Other specified disorders of kidney and ureter: Secondary | ICD-10-CM

## 2014-05-16 DIAGNOSIS — N189 Chronic kidney disease, unspecified: Secondary | ICD-10-CM | POA: Insufficient documentation

## 2014-05-16 DIAGNOSIS — I129 Hypertensive chronic kidney disease with stage 1 through stage 4 chronic kidney disease, or unspecified chronic kidney disease: Secondary | ICD-10-CM | POA: Diagnosis not present

## 2014-05-16 DIAGNOSIS — R1900 Intra-abdominal and pelvic swelling, mass and lump, unspecified site: Secondary | ICD-10-CM

## 2014-05-16 DIAGNOSIS — R109 Unspecified abdominal pain: Secondary | ICD-10-CM | POA: Diagnosis present

## 2014-05-16 LAB — COMPREHENSIVE METABOLIC PANEL
ALBUMIN: 3.8 g/dL (ref 3.5–5.2)
ALK PHOS: 77 U/L (ref 39–117)
ALT: 7 U/L (ref 0–35)
AST: 18 U/L (ref 0–37)
Anion gap: 17 — ABNORMAL HIGH (ref 5–15)
BUN: 73 mg/dL — ABNORMAL HIGH (ref 6–23)
CHLORIDE: 107 meq/L (ref 96–112)
CO2: 19 meq/L (ref 19–32)
CREATININE: 3.76 mg/dL — AB (ref 0.50–1.10)
Calcium: 9.4 mg/dL (ref 8.4–10.5)
GFR calc Af Amer: 15 mL/min — ABNORMAL LOW (ref >90)
GFR calc non Af Amer: 13 mL/min — ABNORMAL LOW (ref >90)
Glucose, Bld: 83 mg/dL (ref 70–99)
POTASSIUM: 4.9 meq/L (ref 3.7–5.3)
Sodium: 143 mEq/L (ref 137–147)
Total Protein: 7.5 g/dL (ref 6.0–8.3)

## 2014-05-16 LAB — CBC WITH DIFFERENTIAL/PLATELET
BASOS ABS: 0.1 10*3/uL (ref 0.0–0.1)
Basophils Relative: 1 % (ref 0–1)
Eosinophils Absolute: 0.1 10*3/uL (ref 0.0–0.7)
Eosinophils Relative: 2 % (ref 0–5)
HCT: 32.5 % — ABNORMAL LOW (ref 36.0–46.0)
Hemoglobin: 10 g/dL — ABNORMAL LOW (ref 12.0–15.0)
LYMPHS PCT: 23 % (ref 12–46)
Lymphs Abs: 1.7 10*3/uL (ref 0.7–4.0)
MCH: 30.4 pg (ref 26.0–34.0)
MCHC: 30.8 g/dL (ref 30.0–36.0)
MCV: 98.8 fL (ref 78.0–100.0)
Monocytes Absolute: 0.6 10*3/uL (ref 0.1–1.0)
Monocytes Relative: 8 % (ref 3–12)
NEUTROS ABS: 4.8 10*3/uL (ref 1.7–7.7)
Neutrophils Relative %: 66 % (ref 43–77)
PLATELETS: 278 10*3/uL (ref 150–400)
RBC: 3.29 MIL/uL — ABNORMAL LOW (ref 3.87–5.11)
RDW: 12.4 % (ref 11.5–15.5)
WBC: 7.3 10*3/uL (ref 4.0–10.5)

## 2014-05-16 MED ORDER — ONDANSETRON HCL 4 MG PO TABS: 4.0000 mg | ORAL_TABLET | Freq: Four times a day (QID) | ORAL | Status: DC

## 2014-05-16 MED ORDER — HYDROCODONE-ACETAMINOPHEN 5-325 MG PO TABS: 1.0000 | ORAL_TABLET | Freq: Four times a day (QID) | ORAL | Status: DC | PRN

## 2014-05-16 NOTE — ED Provider Notes (Signed)
CSN: [id number]     Arrival date & time 05/16/14  1553 History   First MD Initiated Contact with Patient 05/16/14 1652     Chief Complaint  Patient presents with  . Abdominal Pain  . Abnormal Lab     (Consider location/radiation/quality/duration/timing/severity/associated sxs/prior Treatment) HPI Comments: The patient is a 48 year old female past history of CKD (not on dialysis), hypertension presenting to the emergency department with a chief complaint of right mid abdominal discomfort ongoing for several months, worse over the last 2 days. Patient reports on ultrasound a mass was identified. She reports being evaluated in emergency department yesterday for similar complaints, left due to wait. Patient contacted today by her nephrologist and told to return to ED for hyperkalemia and CT. Reports associated nausea, last emesis 3 days ago. Patient reports right-sided abdominal discomfort worsened with body position. Patient denies nausea, diarrhea, constipation, dysuria, hematuria. Patient denies night sweats, reports weight loss. PCP Dr. Osborne Casco Nephrologist: Cherlynn Polo  The history is provided by the patient. No language interpreter was used.    Past Medical History  Diagnosis Date  . Hypertension   . Renal disorder    No past surgical history on file. No family history on file. History  Substance Use Topics  . Smoking status: Never Smoker   . Smokeless tobacco: Not on file  . Alcohol Use: Yes   OB History    No data available     Review of Systems  Constitutional: Positive for unexpected weight change. Negative for fever, chills and fatigue.  Gastrointestinal: Positive for abdominal pain. Negative for nausea, vomiting, diarrhea, constipation and blood in stool.  Genitourinary: Negative for dysuria, urgency and hematuria.      Allergies  Review of patient's allergies indicates no known allergies.  Home Medications   Prior to Admission medications   Medication Sig Start  Date End Date Taking? Authorizing Provider  amLODipine (NORVASC) 10 MG tablet Take 10 mg by mouth daily.   Yes Historical Provider, MD  atorvastatin (LIPITOR) 20 MG tablet Take 20 mg by mouth daily.   Yes Historical Provider, MD  cholecalciferol (VITAMIN D) 1000 UNITS tablet Take 1,000 Units by mouth 3 (three) times a week. Every Monday, Wednesday, and Friday   Yes Historical Provider, MD  furosemide (LASIX) 40 MG tablet Take 20 mg by mouth daily. 01/07/14  Yes Historical Provider, MD  nebivolol (BYSTOLIC) 10 MG tablet Take 10 mg by mouth daily.   Yes Historical Provider, MD  cephALEXin (KEFLEX) 500 MG capsule Take 1 capsule (500 mg total) by mouth 2 (two) times daily. 03/24/14   Jasper Riling. Pickering, MD  telmisartan (MICARDIS) 40 MG tablet Take 40 mg by mouth daily.    Historical Provider, MD   BP 171/96 mmHg  Pulse 77  Temp(Src) 98 F (36.7 C) (Oral)  Resp 16  Ht 5\' 5"  (1.651 m)  Wt 123 lb (55.792 kg)  BMI 20.47 kg/m2  SpO2 100% Physical Exam  Constitutional: She is oriented to person, place, and time. She appears well-developed and well-nourished. No distress.  HENT:  Head: Normocephalic and atraumatic.  Eyes: EOM are normal. Pupils are equal, round, and reactive to light.  Neck: Neck supple.  Cardiovascular: Normal rate and regular rhythm.   Pulmonary/Chest: Effort normal and breath sounds normal. She has no wheezes. She has no rales.  Abdominal: Soft. Normal appearance. She exhibits mass. She exhibits no distension. There is tenderness. There is no rebound and no guarding.    Hard non-mobile mass in Right  mid abdomen, mild tenderness with palpation.  Neurological: She is alert and oriented to person, place, and time.  Skin: Skin is warm and dry.  Psychiatric: She has a normal mood and affect. Her behavior is normal.  Nursing note and vitals reviewed.   ED Course  Procedures (including critical care time) Labs Review Results for orders placed or performed during the hospital  encounter of 05/16/14  CBC with Differential  Result Value Ref Range   WBC 7.3 4.0 - 10.5 K/uL   RBC 3.29 (L) 3.87 - 5.11 MIL/uL   Hemoglobin 10.0 (L) 12.0 - 15.0 g/dL   HCT 32.5 (L) 36.0 - 46.0 %   MCV 98.8 78.0 - 100.0 fL   MCH 30.4 26.0 - 34.0 pg   MCHC 30.8 30.0 - 36.0 g/dL   RDW 12.4 11.5 - 15.5 %   Platelets 278 150 - 400 K/uL   Neutrophils Relative % 66 43 - 77 %   Neutro Abs 4.8 1.7 - 7.7 K/uL   Lymphocytes Relative 23 12 - 46 %   Lymphs Abs 1.7 0.7 - 4.0 K/uL   Monocytes Relative 8 3 - 12 %   Monocytes Absolute 0.6 0.1 - 1.0 K/uL   Eosinophils Relative 2 0 - 5 %   Eosinophils Absolute 0.1 0.0 - 0.7 K/uL   Basophils Relative 1 0 - 1 %   Basophils Absolute 0.1 0.0 - 0.1 K/uL  Comprehensive metabolic panel  Result Value Ref Range   Sodium 143 137 - 147 mEq/L   Potassium 4.9 3.7 - 5.3 mEq/L   Chloride 107 96 - 112 mEq/L   CO2 19 19 - 32 mEq/L   Glucose, Bld 83 70 - 99 mg/dL   BUN 73 (H) 6 - 23 mg/dL   Creatinine, Ser 3.76 (H) 0.50 - 1.10 mg/dL   Calcium 9.4 8.4 - 10.5 mg/dL   Total Protein 7.5 6.0 - 8.3 g/dL   Albumin 3.8 3.5 - 5.2 g/dL   AST 18 0 - 37 U/L   ALT 7 0 - 35 U/L   Alkaline Phosphatase 77 39 - 117 U/L   Total Bilirubin <0.2 (L) 0.3 - 1.2 mg/dL   GFR calc non Af Amer 13 (L) >90 mL/min   GFR calc Af Amer 15 (L) >90 mL/min   Anion gap 17 (H) 5 - 15   Ct Abdomen Pelvis Wo Contrast  05/16/2014   CLINICAL DATA:  Abdominal mass on right found on exam. Abdominal pain 2 weeks.  EXAM: CT ABDOMEN AND PELVIS WITHOUT CONTRAST  TECHNIQUE: Multidetector CT imaging of the abdomen and pelvis was performed following the standard protocol without IV contrast.  COMPARISON:  None.  FINDINGS: Lung bases are clear.  No effusions.  Heart is normal size.  Liver, gallbladder, spleen, pancreas and adrenals have an unremarkable unenhanced appearance.  There is a large mass within the mid to lower pole of the right kidney. This measures up to 7.8 cm and is heterogeneous in density.  This does not appear to represent a cyst and is concerning for a solid renal mass and malignancy. This cannot be fully characterized without intravenous contrast however. Peripheral calcifications noted within the mass.  Several hyperdense small left renal lesions noted, most likely hemorrhagic/ proteinaceous cysts.  No hydronephrosis bilaterally.  Uterus, ovaries and urinary bladder are unremarkable on this unenhanced study. Small follicle in the left ovary. Small amount of free fluid in the pelvis.  Appendix is visualized and is normal. Stomach, large and small  bowel grossly unremarkable. No free air or adenopathy.  No acute bony abnormality or focal bone lesion.  IMPRESSION: 7.8 cm heterogeneous appearing right mid to lower pole renal mass with peripheral calcifications. This is difficult to fully characterized without intravenous contrast but is concerning for solid renal mass/ malignancy. Recommend further evaluation with renal protocol CT or MRI.   Electronically Signed   By: Rolm Baptise M.D.   On: 05/16/2014 18:32     EKG Interpretation None      MDM   Final diagnoses:  Abdominal mass  Right kidney mass   Patient presents with complains of right-sided abdominal pain ongoing for several weeks worsened over the last 2 weeks patient reports outside ultrasound showed lesion on right side, advised CT for further evaluation. CT was ordered by patient's PCP, patient was evaluated yesterday in the ED for similar complaints, left AMA prior to CT. CT non contrast ordered due to patient's CKD. Potassium 4.9, down since yesterday 5.5, creatinine 3.75 BUN73 consistent with previous values. CT shows approximately 8 cm right renal mass, likely solid renal malignancy. Plan to follow-up with PCP for outpatient MRI. 1901: re-eval pt resting comfortably in room.  Discussed CT findings and need for follow up MRI with PCP and referral after MRI was obtained.  Discussed pain medication and nausea medication with  the patient.  K 4.9, no kayeliate at this time follow up with nephrologist. Meds given in ED:  Medications - No data to display  Discharge Medication List as of 05/16/2014  7:20 PM    START taking these medications   Details  HYDROcodone-acetaminophen (NORCO/VICODIN) 5-325 MG per tablet Take 1-2 tablets by mouth every 6 (six) hours as needed for moderate pain or severe pain., Starting 05/16/2014, Until Discontinued, Print    ondansetron (ZOFRAN) 4 MG tablet Take 1 tablet (4 mg total) by mouth every 6 (six) hours., Starting 05/16/2014, Until Discontinued, Print        Harvie Heck, PA-C 05/17/14 [place]. Alvino Chapel, MD 05/21/14 [phone]

## 2014-05-16 NOTE — ED Notes (Signed)
Patient transported to CT 

## 2014-05-16 NOTE — ED Notes (Signed)
Pt states that she was here last night and left because she was frustrated. Pt states that she needs to get a CT scan for abd pain and was told that her K was 5.5.

## 2014-05-16 NOTE — Discharge Instructions (Signed)
Call for a follow up appointment with a Family or Primary Care Provider for an out-patient MRI of your abdomen. Return if Symptoms worsen.   Take medication as prescribed.  Do not operate heavy machinery drink alcohol while taking narcotic pain medication, this medication contains tylenol, do not overdose on additional tylenol. Take with food and plenty of water.

## 2014-05-17 ENCOUNTER — Other Ambulatory Visit (HOSPITAL_COMMUNITY): Payer: Self-pay | Admitting: *Deleted

## 2014-05-18 ENCOUNTER — Ambulatory Visit (HOSPITAL_COMMUNITY)
Admission: RE | Admit: 2014-05-18 | Discharge: 2014-05-18 | Disposition: A | Discharge status: Home / Self Care | Payer: 59 | Source: Ambulatory Visit | Attending: Nephrology | Admitting: Nephrology

## 2014-05-18 DIAGNOSIS — D509 Iron deficiency anemia, unspecified: Secondary | ICD-10-CM | POA: Insufficient documentation

## 2014-05-18 DIAGNOSIS — Z5181 Encounter for therapeutic drug level monitoring: Secondary | ICD-10-CM | POA: Diagnosis not present

## 2014-05-18 MED ORDER — SODIUM CHLORIDE 0.9 % IV SOLN
1020.0000 mg | Freq: Once | INTRAVENOUS | Status: AC
  Administered 2014-05-18: 1020 mg via INTRAVENOUS
  Filled 2014-05-18: qty 34

## 2014-05-29 ENCOUNTER — Other Ambulatory Visit: Payer: Self-pay | Admitting: Urology

## 2014-05-30 ENCOUNTER — Other Ambulatory Visit (HOSPITAL_COMMUNITY): Payer: Self-pay | Admitting: Urology

## 2014-05-30 DIAGNOSIS — N2889 Other specified disorders of kidney and ureter: Secondary | ICD-10-CM

## 2014-06-01 ENCOUNTER — Other Ambulatory Visit (HOSPITAL_COMMUNITY): Payer: 59

## 2014-06-05 ENCOUNTER — Other Ambulatory Visit: Payer: Self-pay | Admitting: Vascular Surgery

## 2014-06-06 ENCOUNTER — Other Ambulatory Visit: Payer: Self-pay

## 2014-06-06 DIAGNOSIS — Z01810 Encounter for preprocedural cardiovascular examination: Secondary | ICD-10-CM

## 2014-06-06 DIAGNOSIS — N032 Chronic nephritic syndrome with diffuse membranous glomerulonephritis: Secondary | ICD-10-CM

## 2014-06-13 ENCOUNTER — Other Ambulatory Visit: Payer: Self-pay | Admitting: Radiology

## 2014-06-14 ENCOUNTER — Ambulatory Visit (HOSPITAL_COMMUNITY)
Admission: RE | Admit: 2014-06-14 | Discharge: 2014-06-14 | Disposition: A | Discharge status: Home / Self Care | Payer: 59 | Source: Ambulatory Visit | Attending: Urology | Admitting: Urology

## 2014-06-14 ENCOUNTER — Encounter (HOSPITAL_COMMUNITY): Payer: Self-pay

## 2014-06-14 DIAGNOSIS — N2889 Other specified disorders of kidney and ureter: Secondary | ICD-10-CM | POA: Insufficient documentation

## 2014-06-14 LAB — APTT: aPTT: 34 seconds (ref 24–37)

## 2014-06-14 LAB — CBC
HCT: 31 % — ABNORMAL LOW (ref 36.0–46.0)
Hemoglobin: 9.6 g/dL — ABNORMAL LOW (ref 12.0–15.0)
MCH: 30.7 pg (ref 26.0–34.0)
MCHC: 31 g/dL (ref 30.0–36.0)
MCV: 99 fL (ref 78.0–100.0)
Platelets: 287 10*3/uL (ref 150–400)
RBC: 3.13 MIL/uL — ABNORMAL LOW (ref 3.87–5.11)
RDW: 13 % (ref 11.5–15.5)
WBC: 8.3 10*3/uL (ref 4.0–10.5)

## 2014-06-14 LAB — PROTIME-INR
INR: 1.04 (ref 0.00–1.49)
Prothrombin Time: 13.7 seconds (ref 11.6–15.2)

## 2014-06-14 MED ORDER — MIDAZOLAM HCL 2 MG/2ML IJ SOLN
INTRAMUSCULAR | Status: AC | PRN
  Administered 2014-06-14: 2 mg via INTRAVENOUS

## 2014-06-14 MED ORDER — FENTANYL CITRATE 0.05 MG/ML IJ SOLN
INTRAMUSCULAR | Status: AC | PRN
  Administered 2014-06-14 (×2): 50 ug via INTRAVENOUS

## 2014-06-14 MED ORDER — FENTANYL CITRATE 0.05 MG/ML IJ SOLN
INTRAMUSCULAR | Status: AC
  Filled 2014-06-14: qty 4

## 2014-06-14 MED ORDER — MIDAZOLAM HCL 2 MG/2ML IJ SOLN
INTRAMUSCULAR | Status: AC
  Filled 2014-06-14: qty 4

## 2014-06-14 MED ORDER — SODIUM CHLORIDE 0.9 % IV SOLN
INTRAVENOUS | Status: DC
  Administered 2014-06-14: 07:00:00 via INTRAVENOUS

## 2014-06-14 NOTE — H&P (Signed)
Chief Complaint: "I am here for kidney biopsy."  Referring Physician(s): Herrick,Benjamin W  History of Present Illness: Terry Juarez is a 48 y.o. female who has been seen by urology on 05/28/14 after PCP found right renal mass on PE and CT for workup of abdominal pain. The patient denies any hematuria or current right flank pain. She admits to pelvic distention today. She denies any chest pain, shortness of breath or palpitations. She denies any active signs of bleeding or excessive bruising. She denies any recent fever or chills. The patient denies any history of sleep apnea or chronic oxygen use. She denies any known complications to sedation.    Past Medical History  Diagnosis Date  . Hypertension   . Renal disorder     Past Surgical History  Procedure Laterality Date  . Ectopic pregnancy surgery  1987    Allergies: Review of patient's allergies indicates no known allergies.  Medications: Prior to Admission medications   Medication Sig Start Date End Date Taking? Authorizing Provider  amLODipine (NORVASC) 10 MG tablet Take 10 mg by mouth daily.   Yes Historical Provider, MD  atorvastatin (LIPITOR) 20 MG tablet Take 20 mg by mouth daily.   Yes Historical Provider, MD  cholecalciferol (VITAMIN D) 1000 UNITS tablet Take 1,000 Units by mouth 3 (three) times a week. Every Monday, Wednesday, and Friday   Yes Historical Provider, MD  furosemide (LASIX) 40 MG tablet Take 20 mg by mouth daily. 01/07/14  Yes Historical Provider, MD  HYDROcodone-acetaminophen (NORCO/VICODIN) 5-325 MG per tablet Take 1-2 tablets by mouth every 6 (six) hours as needed for moderate pain or severe pain. 05/16/14  Yes Harvie Heck, PA-C  nebivolol (BYSTOLIC) 10 MG tablet Take 10 mg by mouth daily.   Yes Historical Provider, MD  cephALEXin (KEFLEX) 500 MG capsule Take 1 capsule (500 mg total) by mouth 2 (two) times daily. 03/24/14   Jasper Riling. Pickering, MD  ondansetron (ZOFRAN) 4 MG tablet Take 1 tablet  (4 mg total) by mouth every 6 (six) hours. 05/16/14   Harvie Heck, PA-C  telmisartan (MICARDIS) 40 MG tablet Take 40 mg by mouth daily.    Historical Provider, MD    History reviewed. No pertinent family history.  History   Social History  . Marital Status: Widowed    Spouse Name: N/A    Number of Children: N/A  . Years of Education: N/A   Social History Main Topics  . Smoking status: Never Smoker   . Smokeless tobacco: None  . Alcohol Use: Yes     Comment: occ  . Drug Use: No  . Sexual Activity: None   Other Topics Concern  . None   Social History Narrative   Review of Systems: A 12 point ROS discussed and pertinent positives are indicated in the HPI above.  All other systems are negative.  Review of Systems  Vital Signs: BP 154/87 mmHg  Pulse 67  Temp(Src) 97.6 F (36.4 C) (Oral)  Resp 16  Ht 5\' 5"  (1.651 m)  Wt 118 lb (53.524 kg)  BMI 19.64 kg/m2  SpO2 100%  Physical Exam  Constitutional: She is oriented to person, place, and time. No distress.  HENT:  Head: Normocephalic and atraumatic.  Neck: No tracheal deviation present.  Cardiovascular: Normal rate and regular rhythm.  Exam reveals no gallop and no friction rub.   No murmur heard. Pulmonary/Chest: Effort normal and breath sounds normal. No respiratory distress. She has no wheezes. She has no rales.  Abdominal: Soft. Bowel sounds are normal. She exhibits no distension and no mass.  Pelvic tenderness TP, right flank mass  Neurological: She is alert and oriented to person, place, and time.  Skin: She is not diaphoretic.  Psychiatric: She has a normal mood and affect. Her behavior is normal. Thought content normal.    Imaging: Ct Abdomen Pelvis Wo Contrast  05/16/2014   CLINICAL DATA:  Abdominal mass on right found on exam. Abdominal pain 2 weeks.  EXAM: CT ABDOMEN AND PELVIS WITHOUT CONTRAST  TECHNIQUE: Multidetector CT imaging of the abdomen and pelvis was performed following the standard protocol  without IV contrast.  COMPARISON:  None.  FINDINGS: Lung bases are clear.  No effusions.  Heart is normal size.  Liver, gallbladder, spleen, pancreas and adrenals have an unremarkable unenhanced appearance.  There is a large mass within the mid to lower pole of the right kidney. This measures up to 7.8 cm and is heterogeneous in density. This does not appear to represent a cyst and is concerning for a solid renal mass and malignancy. This cannot be fully characterized without intravenous contrast however. Peripheral calcifications noted within the mass.  Several hyperdense small left renal lesions noted, most likely hemorrhagic/ proteinaceous cysts.  No hydronephrosis bilaterally.  Uterus, ovaries and urinary bladder are unremarkable on this unenhanced study. Small follicle in the left ovary. Small amount of free fluid in the pelvis.  Appendix is visualized and is normal. Stomach, large and small bowel grossly unremarkable. No free air or adenopathy.  No acute bony abnormality or focal bone lesion.  IMPRESSION: 7.8 cm heterogeneous appearing right mid to lower pole renal mass with peripheral calcifications. This is difficult to fully characterized without intravenous contrast but is concerning for solid renal mass/ malignancy. Recommend further evaluation with renal protocol CT or MRI.   Electronically Signed   By: Rolm Baptise M.D.   On: 05/16/2014 18:32    Labs:  CBC:  Recent Labs  03/24/14 1125 05/15/14 1528 05/16/14 1635 06/14/14 0720  WBC 10.6* 7.6 7.3 8.3  HGB 12.2 10.1* 10.0* 9.6*  HCT 37.1 31.6* 32.5* 31.0*  PLT 359 296 278 287    COAGS:  Recent Labs  06/14/14 0720  INR 1.04  APTT 34    BMP:  Recent Labs  03/24/14 1125 03/24/14 1306 05/15/14 1528 05/16/14 1635  NA 141 146 141 143  K 6.2* 4.9 5.5* 4.9  CL 111 116* 107 107  CO2 13* 14* 20 19  GLUCOSE 117* 98 89 83  BUN 60* 58* 68* 73*  CALCIUM 9.5 8.8 9.6 9.4  CREATININE 3.05* 2.93* 3.22* 3.76*  GFRNONAA 17* 18* 16*  13*  GFRAA 20* 21* 18* 15*    LIVER FUNCTION TESTS:  Recent Labs  08/05/13 1807 03/24/14 1125 05/15/14 1528 05/16/14 1635  BILITOT 0.3 0.4 0.2* <0.2*  AST 14 36 18 18  ALT 6 36* 6 7  ALKPHOS 75 88 80 77  PROT 7.6 8.4* 7.7 7.5  ALBUMIN 3.8 4.1 4.0 3.8   Assessment and Plan: Right renal mass CKD, Cr 3.22 Scheduled today for image guided right renal biopsy with moderate sedation Patient has been NPO, no blood thinners taken, labs reviewed Risks and Benefits discussed with the patient. All of the patient's questions were answered, patient is agreeable to proceed. Consent signed and in chart. Seen by Urology 05/28/14, no interval changes.     SignedHedy Jacob 06/14/2014, 8:39 AM

## 2014-06-14 NOTE — Procedures (Signed)
CT guided core biopsies of right renal mass.  3 cores in formalin and one in saline.  No immediate complication.

## 2014-06-14 NOTE — Discharge Instructions (Signed)
Conscious Sedation, Adult, Care After Refer to this sheet in the next few weeks. These instructions provide you with information on caring for yourself after your procedure. Your health care provider may also give you more specific instructions. Your treatment has been planned according to current medical practices, but problems sometimes occur. Call your health care provider if you have any problems or questions after your procedure. WHAT TO EXPECT AFTER THE PROCEDURE  After your procedure:  You may feel sleepy, clumsy, and have poor balance for several hours.  Vomiting may occur if you eat too soon after the procedure. HOME CARE INSTRUCTIONS  Do not participate in any activities where you could become injured for at least 24 hours. Do not:  Drive.  Swim.  Ride a bicycle.  Operate heavy machinery.  Cook.  Use power tools.  Climb ladders.  Work from a high place.  Do not make important decisions or sign legal documents until you are improved.  If you vomit, drink water, juice, or soup when you can drink without vomiting. Make sure you have little or no nausea before eating solid foods.  Only take over-the-counter or prescription medicines for pain, discomfort, or fever as directed by your health care provider.  Make sure you and your family fully understand everything about the medicines given to you, including what side effects may occur.  You should not drink alcohol, take sleeping pills, or take medicines that cause drowsiness for at least 24 hours.  If you smoke, do not smoke without supervision.  If you are feeling better, you may resume normal activities 24 hours after you were sedated.  Keep all appointments with your health care provider. SEEK MEDICAL CARE IF:  Your skin is pale or bluish in color.  You continue to feel nauseous or vomit.  Your pain is getting worse and is not helped by medicine.  You have bleeding or swelling.  You are still sleepy or  feeling clumsy after 24 hours. SEEK IMMEDIATE MEDICAL CARE IF:  You develop a rash.  You have difficulty breathing.  You develop any type of allergic problem.  You have a fever. MAKE SURE YOU:  Understand these instructions.  Will watch your condition.   Will get help right away if you are not doing well or get worse. Document Released: 04/12/2013 Document Reviewed: 04/12/2013 [facility] Patient Information 2015 [place], [place]. This information is not intended to replace advice given to you by your health care provider. Make sure you discuss any questions you have with your health care provider.   Kidney Biopsy, Care After Refer to this sheet in the next few weeks. These instructions provide you with information on caring for yourself after your procedure. Your health care provider may also give you more specific instructions. Your treatment has been planned according to current medical practices, but problems sometimes occur. Call your health care provider if you have any problems or questions after your procedure.  WHAT TO EXPECT AFTER THE PROCEDURE   You may notice blood in the urine for the first 24 hours after the biopsy.  You may feel some pain at the biopsy site for 1-2 weeks after the biopsy. HOME CARE INSTRUCTIONS  Do not lift anything heavier than 10 lb (4.5 kg) for 2 weeks.  Do not take any non-steroidal anti-inflammatory drugs (NSAIDs) or any blood thinners for a week after the biopsy unless instructed to do so by your health care provider.  Only take medicines for pain, fever, or discomfort as  directed by your health care provider. SEEK MEDICAL CARE IF:  You have bloody urine more than 24 hours after the biopsy.   You develop a fever.   You cannot urinate.   You have increasing pain at the biopsy site.  SEEK IMMEDIATE MEDICAL CARE IF: You feel faint or dizzy.   Document Released: 02/22/2013 Document Reviewed: 02/22/2013 [facility] Patient Information  2015 [place]. This information is not intended to replace advice given to you by your health care provider. Make sure you discuss any questions you have with your health care provider.

## 2014-06-18 ENCOUNTER — Encounter: Payer: Self-pay | Admitting: Vascular Surgery

## 2014-06-19 ENCOUNTER — Ambulatory Visit (INDEPENDENT_AMBULATORY_CARE_PROVIDER_SITE_OTHER)
Admission: RE | Admit: 2014-06-19 | Discharge: 2014-06-19 | Disposition: A | Discharge status: Home / Self Care | Payer: 59 | Source: Ambulatory Visit | Attending: Vascular Surgery | Admitting: Vascular Surgery

## 2014-06-19 ENCOUNTER — Encounter: Payer: Self-pay | Admitting: Vascular Surgery

## 2014-06-19 ENCOUNTER — Ambulatory Visit (HOSPITAL_COMMUNITY)
Admission: RE | Admit: 2014-06-19 | Discharge: 2014-06-19 | Disposition: A | Discharge status: Home / Self Care | Payer: 59 | Source: Ambulatory Visit | Attending: Vascular Surgery | Admitting: Vascular Surgery

## 2014-06-19 ENCOUNTER — Ambulatory Visit (INDEPENDENT_AMBULATORY_CARE_PROVIDER_SITE_OTHER): Payer: 59 | Admitting: Vascular Surgery

## 2014-06-19 VITALS — BP 165/98 | HR 73 | Ht 65.0 in | Wt 122.2 lb

## 2014-06-19 DIAGNOSIS — Z01810 Encounter for preprocedural cardiovascular examination: Secondary | ICD-10-CM

## 2014-06-19 DIAGNOSIS — N184 Chronic kidney disease, stage 4 (severe): Secondary | ICD-10-CM

## 2014-06-19 DIAGNOSIS — N032 Chronic nephritic syndrome with diffuse membranous glomerulonephritis: Secondary | ICD-10-CM | POA: Diagnosis not present

## 2014-06-19 NOTE — Progress Notes (Signed)
[facility] New Access  Referred by:  [name], MD [address] [place], [place] [address]  Reason for referral: New access  History of Present Illness  Terry Juarez is a 48 y.o. (1965-10-22) female who presents for evaluation for permanent access.  The patient is right hand dominant.  The patient has not had previous access procedures. She has chronic kidney disease stage IV related to focal segmental glomerulosclerosis followed by Dr. [name]. In the past month, she had some right sided abdominal pain and felt a mass. She had an ultrasound and CT scan conducted and most recently a renal biopsy on 06/15/14 that confirmed renal cell carcinoma. She is scheduled for removal of her right kidney on 07/13/14. It was felt that after kidney removal, she would likely become dialysis dependent.   She has past medical history of hypertension managed on two antihypertensives. She is not on any blood thinners.   Past Medical History  Diagnosis Date  . Hypertension   . Renal disorder     Past Surgical History  Procedure Laterality Date  . Ectopic pregnancy surgery  1987    History   Social History  . Marital Status: Widowed    Spouse Name: N/A    Number of Children: N/A  . Years of Education: N/A   Occupational History  . Not on file.   Social History Main Topics  . Smoking status: Never Smoker   . Smokeless tobacco: Not on file  . Alcohol Use: 0.0 oz/week    0 Not specified per week     Comment: occ  . Drug Use: No  . Sexual Activity: Not on file   Other Topics Concern  . Not on file   Social History Narrative    No family history on file.   Current Outpatient Prescriptions on File Prior to Visit  Medication Sig Dispense Refill  . amLODipine (NORVASC) 10 MG tablet Take 10 mg by mouth daily.    Marland Kitchen atorvastatin (LIPITOR) 20 MG tablet Take 20 mg by mouth daily.    . cholecalciferol (VITAMIN D) 1000 UNITS tablet Take 1,000 Units by mouth 3  (three) times a week. Every Monday, Wednesday, and Friday    . furosemide (LASIX) 40 MG tablet Take 20 mg by mouth daily.    . nebivolol (BYSTOLIC) 10 MG tablet Take 10 mg by mouth daily.    . cephALEXin (KEFLEX) 500 MG capsule Take 1 capsule (500 mg total) by mouth 2 (two) times daily. (Patient not taking: Reported on 06/19/2014) 10 capsule 0  . HYDROcodone-acetaminophen (NORCO/VICODIN) 5-325 MG per tablet Take 1-2 tablets by mouth every 6 (six) hours as needed for moderate pain or severe pain. (Patient not taking: Reported on 06/19/2014) 20 tablet 0  . ondansetron (ZOFRAN) 4 MG tablet Take 1 tablet (4 mg total) by mouth every 6 (six) hours. (Patient not taking: Reported on 06/19/2014) 12 tablet 0  . telmisartan (MICARDIS) 40 MG tablet Take 40 mg by mouth daily.     No current facility-administered medications on file prior to visit.    No Known Allergies    REVIEW OF SYSTEMS:  (Positives checked otherwise negative)  CARDIOVASCULAR:  []  chest pain, []  chest pressure, []  palpitations, []  shortness of breath when laying flat, []  shortness of breath with exertion,  [x]  pain in right leg when walking, []  pain in feet when laying flat, []  history of blood clot in veins (DVT), []  history of phlebitis, []  swelling in legs, []   varicose veins  PULMONARY:  []  productive cough, []  asthma, []  wheezing  NEUROLOGIC:  []  weakness in arms or legs, []  numbness in arms or legs, []  difficulty speaking or slurred speech, []  temporary loss of vision in one eye, []  dizziness  HEMATOLOGIC:  []  bleeding problems, []  problems with blood clotting too easily  MUSCULOSKEL:  []  joint pain, []  joint swelling  GASTROINTEST:  []  vomiting blood, []  blood in stool     GENITOURINARY:  []  burning with urination, []  blood in urine  PSYCHIATRIC:  []  history of major depression  INTEGUMENTARY:  []  rashes, []  ulcers  CONSTITUTIONAL:  []  fever, []  chills  Physical Examination  Filed Vitals:   06/19/14 1439  BP:  165/98  Pulse: 73  Height: 5\' 5"  (1.651 m)  Weight: 122 lb 3.2 oz (55.43 kg)  SpO2: 100%   Body mass index is 20.34 kg/(m^2).  General: A&O x 3, WDWN female in NAD  Head: [place]/AT  Neck: Supple,  Pulmonary: Sym exp, good air movt, CTAB, no rales, rhonchi, & wheezing,   Cardiac: RRR, Nl S1, S2, no murmurs, rubs or gallops  Vascular: 2+ radial and ulnar pulses bilaterally. 2+ dorsalis pedis pulses bilaterally   Gastrointestinal: soft, NTND, normoactive bowel sounds, no palpable masses  Musculoskeletal: M/S 5/5 throughout. Extremities without ischemic changes  Neurologic: CN 2-12 intact. Pain and light touch intact in extremities. Motor exam as listed above  Psychiatric: Judgment intact, Mood & affect appropriate for pt's clinical situation  Dermatologic: See M/S exam for extremity exam, no rashes otherwise noted   Non-Invasive Vascular Imaging  Vein Mapping  (Date: 06/19/2014):   R arm: acceptable vein conduits include: cephalic forearm-upper arm  L arm: acceptable vein conduits include: none  BUE Doppler (Date: 06/19/2014)  Patent arteries of upper extremities bilaterally.   Outside Studies/Documentation 4 pages of outside documents were reviewed including: Notes from [place] is a 48 y.o. female who presents with CKD stage IV related to focal segmental glomerulosclerosis. She was recently diagnosed with renal cell carcinoma and will require removal of her kidney on 07/13/14. According to Dr. [name], this will likely render her dialysis dependent.   Based on vein mapping and examination, this patient's permanent access options include right radial cephalic and right brachial cephalic AV fistula. She does not have any adequate vein conduits on the left side. The patient is right handed.   I had an extensive discussion with this patient in regards to the nature of access surgery, including risk, benefits, and  alternatives.    The patient is aware that the risks of access surgery include but are not limited to: bleeding, infection, steal syndrome, failure of access to mature, and possible need for additional access procedures in the future.  The patient has agreed to proceed with the above procedure which will be scheduled 07/11/14 with tunneled dialysis catheter placement by Dr. Kellie Simmering. Virgina Jock, PA-C [facility] [place] Office: [phone] Pager: [phone]  06/19/2014, 3:49 PM  This patient was seen in conjunction with Dr. Kellie Simmering

## 2014-06-25 ENCOUNTER — Other Ambulatory Visit: Payer: Self-pay

## 2014-07-05 NOTE — Patient Instructions (Addendum)
Terry Juarez  07/05/2014   Your procedure is scheduled on: 07/13/14   Report to [facility]  Entrance and follow signs to               [place] at 9:00 AM.   Call this number if you have problems the morning of surgery [phone]   Remember:  Do not eat food or drink liquids :After Midnight.     Take these medicines the morning of surgery with A SIP OF WATER: HYDRALAZINE                               You may not have any metal on your body including hair pins and              piercings  Do not wear jewelry, make-up, lotions, powders or perfumes.             Do not wear nail polish.  Do not shave  48 hours prior to surgery.              Men may shave face and neck.   Do not bring valuables to the hospital. [place].  Contacts, dentures or bridgework may not be worn into surgery.  Leave suitcase in the car. After surgery it may be brought to your room.     Patients discharged the day of surgery will not be allowed to drive home.  Name and phone number of your driver:  Special Instructions: N/A              Please read over the following fact sheets you were given: _____________________________________________________________________                                                     [place]  Before surgery, you can play an important role.  Because skin is not sterile, your skin needs to be as free of germs as possible.  You can reduce the number of germs on your skin by washing with CHG (chlorahexidine gluconate) soap before surgery.  CHG is an antiseptic cleaner which kills germs and bonds with the skin to continue killing germs even after washing. Please DO NOT use if you have an allergy to CHG or antibacterial soaps.  If your skin becomes reddened/irritated stop using the CHG and inform your nurse when you arrive at Short Stay. Do not shave (including legs and  underarms) for at least 48 hours prior to the first CHG shower.  You may shave your face. Please follow these instructions carefully:   1.  Shower with CHG Soap the night before surgery and the  morning of Surgery.   2.  If you choose to wash your hair, wash your hair first as usual with your  normal  Shampoo.   3.  After you shampoo, rinse your hair and body thoroughly to remove the  shampoo.  4.  Use CHG as you would any other liquid soap.  You can apply chg directly  to the skin and wash . Gently wash with scrungie or clean wascloth    5.  Apply the CHG Soap to your body ONLY FROM THE NECK DOWN.   Do not use on open                           Wound or open sores. Avoid contact with eyes, ears mouth and genitals (private parts).                        Genitals (private parts) with your normal soap.              6.  Wash thoroughly, paying special attention to the area where your surgery  will be performed.   7.  Thoroughly rinse your body with warm water from the neck down.   8.  DO NOT shower/wash with your normal soap after using and rinsing off  the CHG Soap .                9.  Pat yourself dry with a clean towel.             10.  Wear clean pajamas.             11.  Place clean sheets on your bed the night of your first shower and do not  sleep with pets.  Day of Surgery : Do not apply any lotions/deodorants the morning of surgery.  Please wear clean clothes to the hospital/surgery center.  FAILURE TO FOLLOW THESE INSTRUCTIONS MAY RESULT IN THE CANCELLATION OF YOUR SURGERY    PATIENT SIGNATURE_________________________________  ______________________________________________________________________    WHAT IS A BLOOD TRANSFUSION? Blood Transfusion Information  A transfusion is the replacement of blood or some of its parts. Blood is made up of multiple cells which provide different functions.  Red blood cells carry oxygen and are  used for blood loss replacement.  White blood cells fight against infection.  Platelets control bleeding.  Plasma helps clot blood.  Other blood products are available for specialized needs, such as hemophilia or other clotting disorders. BEFORE THE TRANSFUSION  Who gives blood for transfusions?   Healthy volunteers who are fully evaluated to make sure their blood is safe. This is blood bank blood. Transfusion therapy is the safest it has ever been in the practice of medicine. Before blood is taken from a donor, a complete history is taken to make sure that person has no history of diseases nor engages in risky social behavior (examples are intravenous drug use or sexual activity with multiple partners). The donor's travel history is screened to minimize risk of transmitting infections, such as malaria. The donated blood is tested for signs of infectious diseases, such as HIV and hepatitis. The blood is then tested to be sure it is compatible with you in order to minimize the chance of a transfusion reaction. If you or a relative donates blood, this is often done in anticipation of surgery and is not appropriate for emergency situations. It takes many days to process the donated blood. RISKS AND COMPLICATIONS Although transfusion therapy is very safe and saves many lives, the main dangers of transfusion include:   Getting an infectious disease.  Developing a transfusion reaction. This is an allergic reaction to something in the blood you were given. Every  precaution is taken to prevent this. The decision to have a blood transfusion has been considered carefully by your caregiver before blood is given. Blood is not given unless the benefits outweigh the risks. AFTER THE TRANSFUSION  Right after receiving a blood transfusion, you will usually feel much better and more energetic. This is especially true if your red blood cells have gotten low (anemic). The transfusion raises the level of the red  blood cells which carry oxygen, and this usually causes an energy increase.  The nurse administering the transfusion will monitor you carefully for complications. HOME CARE INSTRUCTIONS  No special instructions are needed after a transfusion. You may find your energy is better. Speak with your caregiver about any limitations on activity for underlying diseases you may have. SEEK MEDICAL CARE IF:   Your condition is not improving after your transfusion.  You develop redness or irritation at the intravenous (IV) site. SEEK IMMEDIATE MEDICAL CARE IF:  Any of the following symptoms occur over the next 12 hours:  Shaking chills.  You have a temperature by mouth above 102 F (38.9 C), not controlled by medicine.  Chest, back, or muscle pain.  People around you feel you are not acting correctly or are confused.  Shortness of breath or difficulty breathing.  Dizziness and fainting.  You get a rash or develop hives.  You have a decrease in urine output.  Your urine turns a dark color or changes to pink, red, or brown. Any of the following symptoms occur over the next 10 days:  You have a temperature by mouth above 102 F (38.9 C), not controlled by medicine.  Shortness of breath.  Weakness after normal activity.  The white part of the eye turns yellow (jaundice).  You have a decrease in the amount of urine or are urinating less often.  Your urine turns a dark color or changes to pink, red, or brown. Document Released: 06/19/2000 Document Revised: 09/14/2011 Document Reviewed: 02/06/2008 [facility] Patient Information 2014 [place], [place].  _______________________________________________________________________

## 2014-07-09 ENCOUNTER — Encounter (HOSPITAL_COMMUNITY)
Admission: RE | Admit: 2014-07-09 | Discharge: 2014-07-09 | Disposition: A | Discharge status: Home / Self Care | Payer: 59 | Source: Ambulatory Visit | Attending: Urology | Admitting: Urology

## 2014-07-09 ENCOUNTER — Encounter (HOSPITAL_COMMUNITY): Payer: Self-pay

## 2014-07-09 DIAGNOSIS — N186 End stage renal disease: Secondary | ICD-10-CM | POA: Diagnosis not present

## 2014-07-09 DIAGNOSIS — Z85528 Personal history of other malignant neoplasm of kidney: Secondary | ICD-10-CM | POA: Diagnosis not present

## 2014-07-09 DIAGNOSIS — I120 Hypertensive chronic kidney disease with stage 5 chronic kidney disease or end stage renal disease: Secondary | ICD-10-CM | POA: Diagnosis not present

## 2014-07-09 HISTORY — DX: Other skin changes: R23.8

## 2014-07-09 HISTORY — DX: Anemia, unspecified: D64.9

## 2014-07-09 HISTORY — DX: Spontaneous ecchymoses: R23.3

## 2014-07-09 HISTORY — DX: Other specified disorders of kidney and ureter: N28.89

## 2014-07-09 HISTORY — DX: Hyperlipidemia, unspecified: E78.5

## 2014-07-09 LAB — BASIC METABOLIC PANEL
Anion gap: 6 (ref 5–15)
BUN: 75 mg/dL — ABNORMAL HIGH (ref 6–23)
CO2: 17 mmol/L — ABNORMAL LOW (ref 19–32)
CREATININE: 3.41 mg/dL — AB (ref 0.50–1.10)
Calcium: 9.2 mg/dL (ref 8.4–10.5)
Chloride: 118 mEq/L — ABNORMAL HIGH (ref 96–112)
GFR calc non Af Amer: 15 mL/min — ABNORMAL LOW (ref >90)
GFR, EST AFRICAN AMERICAN: 17 mL/min — AB (ref >90)
Glucose, Bld: 90 mg/dL (ref 70–99)
Potassium: 5.9 mmol/L — ABNORMAL HIGH (ref 3.5–5.1)
Sodium: 141 mmol/L (ref 135–145)

## 2014-07-09 LAB — CBC
HCT: 31.4 % — ABNORMAL LOW (ref 36.0–46.0)
HEMOGLOBIN: 9.5 g/dL — AB (ref 12.0–15.0)
MCH: 30.9 pg (ref 26.0–34.0)
MCHC: 30.3 g/dL (ref 30.0–36.0)
MCV: 102.3 fL — ABNORMAL HIGH (ref 78.0–100.0)
PLATELETS: 307 10*3/uL (ref 150–400)
RBC: 3.07 MIL/uL — AB (ref 3.87–5.11)
RDW: 13.6 % (ref 11.5–15.5)
WBC: 7.8 10*3/uL (ref 4.0–10.5)

## 2014-07-09 LAB — ABO/RH: ABO/RH(D): O POS

## 2014-07-10 ENCOUNTER — Encounter (HOSPITAL_COMMUNITY): Payer: Self-pay | Admitting: *Deleted

## 2014-07-10 MED ORDER — SODIUM CHLORIDE 0.9 % IV SOLN
INTRAVENOUS | Status: DC
  Administered 2014-07-11 (×2): via INTRAVENOUS

## 2014-07-10 MED ORDER — CEFUROXIME SODIUM 1.5 G IJ SOLR
1.5000 g | INTRAMUSCULAR | Status: AC
  Administered 2014-07-11: 1.5 g via INTRAVENOUS
  Filled 2014-07-10: qty 1.5

## 2014-07-11 ENCOUNTER — Ambulatory Visit (HOSPITAL_COMMUNITY): Payer: 59 | Admitting: Anesthesiology

## 2014-07-11 ENCOUNTER — Other Ambulatory Visit: Payer: Self-pay | Admitting: *Deleted

## 2014-07-11 ENCOUNTER — Encounter (HOSPITAL_COMMUNITY)
Admission: RE | Disposition: A | Discharge status: Home / Self Care | Payer: Self-pay | Source: Ambulatory Visit | Attending: Vascular Surgery

## 2014-07-11 ENCOUNTER — Encounter (HOSPITAL_COMMUNITY): Payer: Self-pay | Admitting: Anesthesiology

## 2014-07-11 ENCOUNTER — Ambulatory Visit (HOSPITAL_COMMUNITY): Payer: 59

## 2014-07-11 ENCOUNTER — Ambulatory Visit (HOSPITAL_COMMUNITY)
Admission: RE | Admit: 2014-07-11 | Discharge: 2014-07-11 | Disposition: A | Discharge status: Home / Self Care | Payer: 59 | Source: Ambulatory Visit | Attending: Vascular Surgery | Admitting: Vascular Surgery

## 2014-07-11 DIAGNOSIS — N186 End stage renal disease: Secondary | ICD-10-CM | POA: Diagnosis not present

## 2014-07-11 DIAGNOSIS — Z419 Encounter for procedure for purposes other than remedying health state, unspecified: Secondary | ICD-10-CM

## 2014-07-11 DIAGNOSIS — Z85528 Personal history of other malignant neoplasm of kidney: Secondary | ICD-10-CM | POA: Insufficient documentation

## 2014-07-11 DIAGNOSIS — Z4931 Encounter for adequacy testing for hemodialysis: Secondary | ICD-10-CM

## 2014-07-11 DIAGNOSIS — I120 Hypertensive chronic kidney disease with stage 5 chronic kidney disease or end stage renal disease: Secondary | ICD-10-CM | POA: Diagnosis not present

## 2014-07-11 HISTORY — PX: INSERTION OF DIALYSIS CATHETER: SHX1324

## 2014-07-11 HISTORY — PX: AV FISTULA PLACEMENT: SHX1204

## 2014-07-11 HISTORY — PX: THROMBECTOMY W/ EMBOLECTOMY: SHX2507

## 2014-07-11 HISTORY — PX: PATCH ANGIOPLASTY: SHX6230

## 2014-07-11 LAB — POCT I-STAT 4, (NA,K, GLUC, HGB,HCT)
Glucose, Bld: 98 mg/dL (ref 70–99)
HEMATOCRIT: 35 % — AB (ref 36.0–46.0)
HEMOGLOBIN: 11.9 g/dL — AB (ref 12.0–15.0)
POTASSIUM: 5.1 mmol/L (ref 3.5–5.1)
Sodium: 143 mmol/L (ref 135–145)

## 2014-07-11 LAB — GLUCOSE, CAPILLARY: GLUCOSE-CAPILLARY: 89 mg/dL (ref 70–99)

## 2014-07-11 SURGERY — INSERTION OF DIALYSIS CATHETER
Anesthesia: General | Site: Neck | Laterality: Right

## 2014-07-11 MED ORDER — MIDAZOLAM HCL 5 MG/5ML IJ SOLN
INTRAMUSCULAR | Status: DC | PRN
  Administered 2014-07-11: 2 mg via INTRAVENOUS

## 2014-07-11 MED ORDER — LIDOCAINE HCL (PF) 1 % IJ SOLN
INTRAMUSCULAR | Status: AC
  Filled 2014-07-11: qty 30

## 2014-07-11 MED ORDER — FENTANYL CITRATE 0.05 MG/ML IJ SOLN
INTRAMUSCULAR | Status: DC | PRN
  Administered 2014-07-11 (×5): 50 ug via INTRAVENOUS

## 2014-07-11 MED ORDER — FENTANYL CITRATE 0.05 MG/ML IJ SOLN
INTRAMUSCULAR | Status: AC
  Filled 2014-07-11: qty 5

## 2014-07-11 MED ORDER — LIDOCAINE HCL (CARDIAC) 20 MG/ML IV SOLN
INTRAVENOUS | Status: DC | PRN
  Administered 2014-07-11: 100 mg via INTRAVENOUS

## 2014-07-11 MED ORDER — HEPARIN SODIUM (PORCINE) 1000 UNIT/ML IJ SOLN
INTRAMUSCULAR | Status: AC
  Filled 2014-07-11: qty 1

## 2014-07-11 MED ORDER — 0.9 % SODIUM CHLORIDE (POUR BTL) OPTIME
TOPICAL | Status: DC | PRN
  Administered 2014-07-11: 1000 mL

## 2014-07-11 MED ORDER — HYDROCODONE-ACETAMINOPHEN 5-325 MG PO TABS: 1.0000 | ORAL_TABLET | Freq: Four times a day (QID) | ORAL | Status: DC | PRN

## 2014-07-11 MED ORDER — LIDOCAINE-EPINEPHRINE (PF) 1 %-1:200000 IJ SOLN
INTRAMUSCULAR | Status: AC
  Filled 2014-07-11: qty 10

## 2014-07-11 MED ORDER — SODIUM CHLORIDE 0.9 % IR SOLN
Status: DC | PRN
  Administered 2014-07-11: 500 mL

## 2014-07-11 MED ORDER — ONDANSETRON HCL 4 MG/2ML IJ SOLN
INTRAMUSCULAR | Status: DC | PRN
  Administered 2014-07-11: 4 mg via INTRAVENOUS

## 2014-07-11 MED ORDER — EPHEDRINE SULFATE 50 MG/ML IJ SOLN
INTRAMUSCULAR | Status: DC | PRN
  Administered 2014-07-11 (×3): 10 mg via INTRAVENOUS

## 2014-07-11 MED ORDER — CHLORHEXIDINE GLUCONATE CLOTH 2 % EX PADS: 6.0000 | MEDICATED_PAD | Freq: Once | CUTANEOUS | Status: DC

## 2014-07-11 MED ORDER — FENTANYL CITRATE 0.05 MG/ML IJ SOLN
25.0000 ug | INTRAMUSCULAR | Status: DC | PRN
  Administered 2014-07-11: 50 ug via INTRAVENOUS

## 2014-07-11 MED ORDER — MEPERIDINE HCL 25 MG/ML IJ SOLN: 6.2500 mg | INTRAMUSCULAR | Status: DC | PRN

## 2014-07-11 MED ORDER — PROMETHAZINE HCL 25 MG/ML IJ SOLN: 6.2500 mg | INTRAMUSCULAR | Status: DC | PRN

## 2014-07-11 MED ORDER — MIDAZOLAM HCL 2 MG/2ML IJ SOLN
INTRAMUSCULAR | Status: AC
  Filled 2014-07-11: qty 2

## 2014-07-11 MED ORDER — HEPARIN SODIUM (PORCINE) 1000 UNIT/ML IJ SOLN
INTRAMUSCULAR | Status: DC | PRN
  Administered 2014-07-11: 4.2 mL

## 2014-07-11 MED ORDER — FENTANYL CITRATE 0.05 MG/ML IJ SOLN
INTRAMUSCULAR | Status: AC
  Filled 2014-07-11: qty 2

## 2014-07-11 MED ORDER — PROPOFOL 10 MG/ML IV BOLUS
INTRAVENOUS | Status: DC | PRN
  Administered 2014-07-11: 120 mg via INTRAVENOUS

## 2014-07-11 MED ORDER — PROPOFOL 10 MG/ML IV BOLUS
INTRAVENOUS | Status: AC
  Filled 2014-07-11: qty 20

## 2014-07-11 SURGICAL SUPPLY — 66 items
ARMBAND PINK RESTRICT EXTREMIT (MISCELLANEOUS) ×5 IMPLANT
BAG DECANTER FOR FLEXI CONT (MISCELLANEOUS) ×5 IMPLANT
BLADE SURG 10 STRL SS (BLADE) ×5 IMPLANT
CANISTER SUCTION 2500CC (MISCELLANEOUS) ×5 IMPLANT
CATH CANNON HEMO 15F 50CM (CATHETERS) IMPLANT
CATH CANNON HEMO 15FR 19 (HEMODIALYSIS SUPPLIES) ×5 IMPLANT
CATH CANNON HEMO 15FR 23CM (HEMODIALYSIS SUPPLIES) IMPLANT
CATH CANNON HEMO 15FR 31CM (HEMODIALYSIS SUPPLIES) IMPLANT
CATH CANNON HEMO 15FR 32CM (HEMODIALYSIS SUPPLIES) IMPLANT
CATH EMB 3FR 40CM (CATHETERS) ×5 IMPLANT
CLIP TI MEDIUM 6 (CLIP) ×5 IMPLANT
CLIP TI WIDE RED SMALL 6 (CLIP) ×5 IMPLANT
COVER PROBE W GEL 5X96 (DRAPES) IMPLANT
COVER SURGICAL LIGHT HANDLE (MISCELLANEOUS) ×5 IMPLANT
DECANTER SPIKE VIAL GLASS SM (MISCELLANEOUS) ×5 IMPLANT
DRAIN PENROSE 1/4X12 LTX STRL (WOUND CARE) ×5 IMPLANT
DRAPE C-ARM 42X72 X-RAY (DRAPES) ×5 IMPLANT
DRAPE CHEST BREAST 15X10 FENES (DRAPES) ×5 IMPLANT
ELECT CAUTERY BLADE 6.4 (BLADE) IMPLANT
ELECT REM PT RETURN 9FT ADLT (ELECTROSURGICAL) ×5
ELECTRODE REM PT RTRN 9FT ADLT (ELECTROSURGICAL) ×3 IMPLANT
GAUZE SPONGE 2X2 8PLY STRL LF (GAUZE/BANDAGES/DRESSINGS) ×3 IMPLANT
GAUZE SPONGE 4X4 16PLY XRAY LF (GAUZE/BANDAGES/DRESSINGS) ×5 IMPLANT
GEL ULTRASOUND 20GR AQUASONIC (MISCELLANEOUS) IMPLANT
GLOVE BIO SURGEON STRL SZ 6.5 (GLOVE) ×20 IMPLANT
GLOVE BIO SURGEONS STRL SZ 6.5 (GLOVE) ×5
GLOVE BIOGEL PI IND STRL 6.5 (GLOVE) ×9 IMPLANT
GLOVE BIOGEL PI IND STRL 7.0 (GLOVE) ×3 IMPLANT
GLOVE BIOGEL PI INDICATOR 6.5 (GLOVE) ×6
GLOVE BIOGEL PI INDICATOR 7.0 (GLOVE) ×2
GLOVE ECLIPSE 6.5 STRL STRAW (GLOVE) ×5 IMPLANT
GLOVE SS BIOGEL STRL SZ 7 (GLOVE) ×6 IMPLANT
GLOVE SUPERSENSE BIOGEL SZ 7 (GLOVE) ×4
GOWN STRL REUS W/ TWL LRG LVL3 (GOWN DISPOSABLE) ×9 IMPLANT
GOWN STRL REUS W/TWL LRG LVL3 (GOWN DISPOSABLE) ×6
KIT BASIN OR (CUSTOM PROCEDURE TRAY) ×5 IMPLANT
KIT ROOM TURNOVER OR (KITS) ×5 IMPLANT
LIQUID BAND (GAUZE/BANDAGES/DRESSINGS) ×15 IMPLANT
NEEDLE 18GX1X1/2 (RX/OR ONLY) (NEEDLE) ×5 IMPLANT
NEEDLE 22X1 1/2 (OR ONLY) (NEEDLE) ×5 IMPLANT
NEEDLE HYPO 25GX1X1/2 BEV (NEEDLE) ×5 IMPLANT
NS IRRIG 1000ML POUR BTL (IV SOLUTION) ×5 IMPLANT
PACK CV ACCESS (CUSTOM PROCEDURE TRAY) ×5 IMPLANT
PACK SURGICAL SETUP 50X90 (CUSTOM PROCEDURE TRAY) ×5 IMPLANT
PAD ARMBOARD 7.5X6 YLW CONV (MISCELLANEOUS) ×10 IMPLANT
PENCIL BUTTON HOLSTER BLD 10FT (ELECTRODE) ×5 IMPLANT
PROBE PENCIL 8 MHZ STRL DISP (MISCELLANEOUS) ×5 IMPLANT
SOAP 2 % CHG 4 OZ (WOUND CARE) ×5 IMPLANT
SPONGE GAUZE 2X2 STER 10/PKG (GAUZE/BANDAGES/DRESSINGS) ×2
SUT ETHILON 3 0 PS 1 (SUTURE) ×5 IMPLANT
SUT PROLENE 6 0 BV (SUTURE) ×5 IMPLANT
SUT PROLENE 6 0 CC (SUTURE) ×10 IMPLANT
SUT PROLENE 7 0 BV 1 (SUTURE) ×5 IMPLANT
SUT SILK 4 0 (SUTURE) ×2
SUT SILK 4-0 18XBRD TIE 12 (SUTURE) ×3 IMPLANT
SUT VIC AB 3-0 SH 27 (SUTURE) ×6
SUT VIC AB 3-0 SH 27X BRD (SUTURE) ×9 IMPLANT
SUT VICRYL 4-0 PS2 18IN ABS (SUTURE) ×15 IMPLANT
SYR 20CC LL (SYRINGE) ×5 IMPLANT
SYR 5ML LL (SYRINGE) ×10 IMPLANT
SYR CONTROL 10ML LL (SYRINGE) ×5 IMPLANT
SYR TB 1ML LUER SLIP (SYRINGE) ×5 IMPLANT
SYRINGE 10CC LL (SYRINGE) ×5 IMPLANT
TAPE CLOTH SURG 4X10 WHT LF (GAUZE/BANDAGES/DRESSINGS) ×5 IMPLANT
UNDERPAD 30X30 INCONTINENT (UNDERPADS AND DIAPERS) ×5 IMPLANT
WATER STERILE IRR 1000ML POUR (IV SOLUTION) ×5 IMPLANT

## 2014-07-11 NOTE — Anesthesia Postprocedure Evaluation (Signed)
  Anesthesia Post-op Note  Patient: [name]  Procedure(s) Performed: Procedure(s): INSERTION OF DIALYSIS CATHETER IN RIGHT INTERNAL JUGULAR  (Right) ARTERIOVENOUS (AV) FISTULA CREATION RIGHT ARM BRACHIO-CEPHALIC WITH ATTEMPTED RADIO-CEPHALIC (AV) FISTULA (Right) THROMBECTOMY OF RIGHT RADIAL ARTERY   (Right) PATCH ANGIOPLASTY OF RIGHT RADIAL ARTERY USING CEPHALIC VEIN. (Right)  Patient Location: PACU  Anesthesia Type:General  Level of Consciousness: awake, alert , oriented and patient cooperative  Airway and Oxygen Therapy: Patient Spontanous Breathing  Post-op Pain: mild  Post-op Assessment: Post-op Vital signs reviewed, Patient's Cardiovascular Status Stable, Respiratory Function Stable, Patent Airway and No signs of Nausea or vomiting  Post-op Vital Signs: stable  Last Vitals:  Filed Vitals:   07/11/14 1545  BP:   Pulse:   Temp: 36.5 C  Resp:     Complications: No apparent anesthesia complications

## 2014-07-11 NOTE — Op Note (Signed)
OPERATIVE REPORT  Date of Surgery: 07/11/2014  Surgeon: Tinnie Gens, MD  Assistant: Leontine Locket PA  Pre-op Diagnosis: End Stage Renal Disease N18.6  Post-op Diagnosis: End Stage Renal Disease N18.6  Procedure: Procedure(s): INSERTION OF DIALYSIS CATHETER IN RIGHT INTERNAL JUGULAR via ultrasound guidance-19 cm-diateck ARTERIOVENOUS (AV) FISTULA CREATION RIGHT ARM BRACHIO-CEPHALIC WITH ATTEMPTED RADIO-CEPHALIC (AV) FISTULA-thrombosed THROMBECTOMY OF RIGHT RADIAL ARTERY   PATCH ANGIOPLASTY OF RIGHT RADIAL ARTERY USING CEPHALIC VEIN.  Anesthesia: LMA  EBL: Minimal  Complications: None  Procedure Details: The patient was taken the operating room placed in supine position at which time satisfactory general-LMA anesthesia was minister. Right upper extremity and upper chest and neck areas were prepped Betadine scrub and solution draped in routine sterile manner. After appropriate prepping and draping in short longitudinal incision was made just proximal to the wrist between the radial artery and cephalic vein. Cephalic vein was dissected free. It was an approximate 2-1/2-3 mm vein appeared adequate for fistula creation. The radial artery was on the small size being only 2-2-1/2 mm. It was encircled with Vesseloops. The branches of the vein were ligated with 4-0 silk ties and divided it was transected gently dilated with heparinized saline marked for orientation purposes. The radial artery was an occluded proximally and distally with Vesseloops open 15 blade extended with Potts scissors. It was a small vein excepting only a 2-1/2 mm dilator with borderline inflow. The vein was carefully measured spatulated and anastomosed end to side with 6-0 Prolene. Initially there was a good pulse and palpable thrill in the cephalic vein up to the mid forearm. After adequate hemostasis was achieved wound was closed in layers with Vicryl subcuticular fashion attention turned to the right neck which had been  imaged with B mode ultrasound in the right IJ appeared to be widely patent. After infiltration forms and Xylocaine the right IJ was entered using a supraclavicular approach guidewire passed into the right atrium under fluoroscopic guidance. After dilating the track appropriately a 19 cm tunneled hemodialysis catheter was passed to a peel-away sheath positioned appropriately tunneled peripherally and secured with nylon sutures and the wound closed with Vicryl in a subcuticular fashion. Sterile dressing applied and attention returned to the right arm where the fistula was noted to be thrombosed with no pulse or thrill in the cephalic vein. It was decided to proceed with a brachial-cephalic fistula. A short transverse incision was made in the antecubital area antecubital vein dissected free cephalic branch was excellent being 3 and half millimeters in size ligated distally and transected. Brachial artery was exposed beneath the fashion circled with Vesseloops. It was then opened with 15 blade extended with Potts scissors it was excellent inflow. Vein was carefully measured spatulated and anastomosed inside with 6-0 Prolene. Clamps released there was an excellent pulse and palpable thrill in the cephalic vein in the proximal upper arm. There was good ulnar arterial flow distally which slightly diminished with the fistula open but did not hear much in the way of radial arterial flow therefore I reopened the vision in the radial area where the 3 original fistula had been attempted. The radial artery was thrombosed. The vein was transected about 3 mm from its anastomosis to the radial artery so that this could be lowest marginal intimal defect noted or any technical problems. The vein was closed over the artery as a pad X and reviewed their continued however right phasic flow in the ulnar artery. Adequate hemostasis was achieved in both applied patient taken to recovery  room in stable condition for a chest x-ray   Tinnie Gens, MD 07/11/2014 2:34 PM

## 2014-07-11 NOTE — Interval H&P Note (Signed)
History and Physical Interval Note:  07/11/2014 11:29 AM  Terry Juarez  has presented today for surgery, with the diagnosis of End Stage Renal Disease N18.6  The various methods of treatment have been discussed with the patient and family. After consideration of risks, benefits and other options for treatment, the patient has consented to  Procedure(s): RADIOCEPHALIC ARTERIOVENOUS (AV) FISTULA CREATION (Right) INSERTION OF DIALYSIS CATHETER (N/A) as a surgical intervention .  The patient's history has been reviewed, patient examined, no change in status, stable for surgery.  I have reviewed the patient's chart and labs.  Questions were answered to the patient's satisfaction.     Tinnie Gens

## 2014-07-11 NOTE — Discharge Instructions (Signed)
° ° °  07/11/2014 CHERRIL SHAWN LZ:[id number] [birth year]  Surgeon(s): Mal Misty, MD  Procedure(s): RADIOCEPHALIC ARTERIOVENOUS (AV) FISTULA CREATION INSERTION OF DIALYSIS CATHETER   Do not stick fistula for 12 weeks

## 2014-07-11 NOTE — H&P (View-Only) (Signed)
[facility] New Access  Referred by:  [name], MD [address] [place], [place] [address]  Reason for referral: New access  History of Present Illness  Terry Juarez is a 49 y.o. (Nov 20, 1965) female who presents for evaluation for permanent access.  The patient is right hand dominant.  The patient has not had previous access procedures. She has chronic kidney disease stage IV related to focal segmental glomerulosclerosis followed by Dr. [name]. In the past month, she had some right sided abdominal pain and felt a mass. She had an ultrasound and CT scan conducted and most recently a renal biopsy on 06/15/14 that confirmed renal cell carcinoma. She is scheduled for removal of her right kidney on 07/13/14. It was felt that after kidney removal, she would likely become dialysis dependent.   She has past medical history of hypertension managed on two antihypertensives. She is not on any blood thinners.   Past Medical History  Diagnosis Date  . Hypertension   . Renal disorder     Past Surgical History  Procedure Laterality Date  . Ectopic pregnancy surgery  1987    History   Social History  . Marital Status: Widowed    Spouse Name: N/A    Number of Children: N/A  . Years of Education: N/A   Occupational History  . Not on file.   Social History Main Topics  . Smoking status: Never Smoker   . Smokeless tobacco: Not on file  . Alcohol Use: 0.0 oz/week    0 Not specified per week     Comment: occ  . Drug Use: No  . Sexual Activity: Not on file   Other Topics Concern  . Not on file   Social History Narrative    No family history on file.   Current Outpatient Prescriptions on File Prior to Visit  Medication Sig Dispense Refill  . amLODipine (NORVASC) 10 MG tablet Take 10 mg by mouth daily.    Marland Kitchen atorvastatin (LIPITOR) 20 MG tablet Take 20 mg by mouth daily.    . cholecalciferol (VITAMIN D) 1000 UNITS tablet Take 1,000 Units by mouth 3  (three) times a week. Every Monday, Wednesday, and Friday    . furosemide (LASIX) 40 MG tablet Take 20 mg by mouth daily.    . nebivolol (BYSTOLIC) 10 MG tablet Take 10 mg by mouth daily.    . cephALEXin (KEFLEX) 500 MG capsule Take 1 capsule (500 mg total) by mouth 2 (two) times daily. (Patient not taking: Reported on 06/19/2014) 10 capsule 0  . HYDROcodone-acetaminophen (NORCO/VICODIN) 5-325 MG per tablet Take 1-2 tablets by mouth every 6 (six) hours as needed for moderate pain or severe pain. (Patient not taking: Reported on 06/19/2014) 20 tablet 0  . ondansetron (ZOFRAN) 4 MG tablet Take 1 tablet (4 mg total) by mouth every 6 (six) hours. (Patient not taking: Reported on 06/19/2014) 12 tablet 0  . telmisartan (MICARDIS) 40 MG tablet Take 40 mg by mouth daily.     No current facility-administered medications on file prior to visit.    No Known Allergies    REVIEW OF SYSTEMS:  (Positives checked otherwise negative)  CARDIOVASCULAR:  []  chest pain, []  chest pressure, []  palpitations, []  shortness of breath when laying flat, []  shortness of breath with exertion,  [x]  pain in right leg when walking, []  pain in feet when laying flat, []  history of blood clot in veins (DVT), []  history of phlebitis, []  swelling in legs, []   varicose veins  PULMONARY:  []  productive cough, []  asthma, []  wheezing  NEUROLOGIC:  []  weakness in arms or legs, []  numbness in arms or legs, []  difficulty speaking or slurred speech, []  temporary loss of vision in one eye, []  dizziness  HEMATOLOGIC:  []  bleeding problems, []  problems with blood clotting too easily  MUSCULOSKEL:  []  joint pain, []  joint swelling  GASTROINTEST:  []  vomiting blood, []  blood in stool     GENITOURINARY:  []  burning with urination, []  blood in urine  PSYCHIATRIC:  []  history of major depression  INTEGUMENTARY:  []  rashes, []  ulcers  CONSTITUTIONAL:  []  fever, []  chills  Physical Examination  Filed Vitals:   06/19/14 1439  BP:  165/98  Pulse: 73  Height: 5\' 5"  (1.651 m)  Weight: 122 lb 3.2 oz (55.43 kg)  SpO2: 100%   Body mass index is 20.34 kg/(m^2).  General: A&O x 3, WDWN female in NAD  Head: [place]/AT  Neck: Supple,  Pulmonary: Sym exp, good air movt, CTAB, no rales, rhonchi, & wheezing,   Cardiac: RRR, Nl S1, S2, no murmurs, rubs or gallops  Vascular: 2+ radial and ulnar pulses bilaterally. 2+ dorsalis pedis pulses bilaterally   Gastrointestinal: soft, NTND, normoactive bowel sounds, no palpable masses  Musculoskeletal: M/S 5/5 throughout. Extremities without ischemic changes  Neurologic: CN 2-12 intact. Pain and light touch intact in extremities. Motor exam as listed above  Psychiatric: Judgment intact, Mood & affect appropriate for pt's clinical situation  Dermatologic: See M/S exam for extremity exam, no rashes otherwise noted   Non-Invasive Vascular Imaging  Vein Mapping  (Date: 06/19/2014):   R arm: acceptable vein conduits include: cephalic forearm-upper arm  L arm: acceptable vein conduits include: none  BUE Doppler (Date: 06/19/2014)  Patent arteries of upper extremities bilaterally.   Outside Studies/Documentation 4 pages of outside documents were reviewed including: Notes from [place] is a 49 y.o. female who presents with CKD stage IV related to focal segmental glomerulosclerosis. She was recently diagnosed with renal cell carcinoma and will require removal of her kidney on 07/13/14. According to Dr. [name], this will likely render her dialysis dependent.   Based on vein mapping and examination, this patient's permanent access options include right radial cephalic and right brachial cephalic AV fistula. She does not have any adequate vein conduits on the left side. The patient is right handed.   I had an extensive discussion with this patient in regards to the nature of access surgery, including risk, benefits, and  alternatives.    The patient is aware that the risks of access surgery include but are not limited to: bleeding, infection, steal syndrome, failure of access to mature, and possible need for additional access procedures in the future.  The patient has agreed to proceed with the above procedure which will be scheduled 07/11/14 with tunneled dialysis catheter placement by Dr. Kellie Simmering. Virgina Jock, PA-C [facility] [place] Office: [phone] Pager: [phone]  06/19/2014, 3:49 PM  This patient was seen in conjunction with Dr. Kellie Simmering

## 2014-07-11 NOTE — Anesthesia Preprocedure Evaluation (Addendum)
Anesthesia Evaluation  Patient identified by MRN, date of birth, ID band Patient awake    Reviewed: Allergy & Precautions, NPO status , Patient's Chart, lab work & pertinent test results, reviewed documented beta blocker date and time   Airway Mallampati: II   Neck ROM: Full    Dental  (+) Teeth Intact   Pulmonary neg pulmonary ROS,  breath sounds clear to auscultation        Cardiovascular hypertension, Pt. on medications negative cardio ROS  Rhythm:Regular     Neuro/Psych negative neurological ROS     GI/Hepatic negative GI ROS, Neg liver ROS,   Endo/Other  negative endocrine ROS  Renal/GU ESRFRenal diseaseGFR 15, Has R sided renal cell CA to be removed 07/13/2014, for dialysis access 07/11/2013     Musculoskeletal   Abdominal (+)  Abdomen: soft.    Peds  Hematology  (+) anemia , H/H  9.5/31   Anesthesia Other Findings   Reproductive/Obstetrics                           Anesthesia Physical Anesthesia Plan  ASA: III  Anesthesia Plan: General   Post-op Pain Management:    Induction: Intravenous  Airway Management Planned: Oral ETT and LMA  Additional Equipment:   Intra-op Plan:   Post-operative Plan: Extubation in OR  Informed Consent: I have reviewed the patients History and Physical, chart, labs and discussed the procedure including the risks, benefits and alternatives for the proposed anesthesia with the patient or authorized representative who has indicated his/her understanding and acceptance.     Plan Discussed with:   Anesthesia Plan Comments: (Renal failure, dialysis soon, for R renal cell ca removal 07/13/2014, [place] 5.9 2 days ago, has been higher in past, would like to re-check this am)        Anesthesia Quick Evaluation

## 2014-07-11 NOTE — Transfer of Care (Signed)
Immediate Anesthesia Transfer of Care Note  Patient: Terry Juarez  Procedure(s) Performed: Procedure(s): INSERTION OF DIALYSIS CATHETER IN RIGHT INTERNAL JUGULAR  (Right) ARTERIOVENOUS (AV) FISTULA CREATION RIGHT ARM BRACHIO-CEPHALIC WITH ATTEMPTED RADIO-CEPHALIC (AV) FISTULA (Right) THROMBECTOMY OF RIGHT RADIAL ARTERY   (Right) PATCH ANGIOPLASTY OF RIGHT RADIAL ARTERY USING CEPHALIC VEIN. (Right)  Patient Location: PACU  Anesthesia Type:General  Level of Consciousness: awake, alert  and oriented  Airway & Oxygen Therapy: Patient Spontanous Breathing and Patient connected to nasal cannula oxygen  Post-op Assessment: Report given to PACU RN and Post -op Vital signs reviewed and stable  Post vital signs: Reviewed and stable  Complications: No apparent anesthesia complications

## 2014-07-12 ENCOUNTER — Encounter (HOSPITAL_COMMUNITY): Payer: Self-pay | Admitting: Vascular Surgery

## 2014-07-12 ENCOUNTER — Telehealth: Payer: Self-pay | Admitting: Vascular Surgery

## 2014-07-12 NOTE — Telephone Encounter (Addendum)
-----   Message from Mena Goes, RN sent at 07/11/2014  2:41 PM EST ----- Regarding: Schedule   ----- Message -----    From: Gabriel Earing, PA-C    Sent: 07/11/2014   1:02 PM      To: [facility] Charge Pool  S/p right RC AVF and diatek cath placement 07/11/14.  F/u with Dr. Kellie Simmering in 6 weeks with duplex.  Thanks, Samantha   notified patient of post op appt. on 08-28-14 at 2:30 with dr. Kellie Simmering

## 2014-07-13 ENCOUNTER — Inpatient Hospital Stay (HOSPITAL_COMMUNITY): Payer: 59 | Admitting: Certified Registered Nurse Anesthetist

## 2014-07-13 ENCOUNTER — Ambulatory Visit (HOSPITAL_COMMUNITY)
Admission: RE | Admit: 2014-07-13 | Discharge: 2014-07-13 | DRG: 700 | Disposition: A | Discharge status: Home / Self Care | Payer: 59 | Source: Ambulatory Visit | Attending: Urology | Admitting: Urology

## 2014-07-13 ENCOUNTER — Encounter (HOSPITAL_COMMUNITY)
Admission: RE | Disposition: A | Discharge status: Home / Self Care | Payer: Self-pay | Source: Ambulatory Visit | Attending: Urology

## 2014-07-13 ENCOUNTER — Other Ambulatory Visit: Payer: Self-pay | Admitting: Urology

## 2014-07-13 ENCOUNTER — Encounter (HOSPITAL_COMMUNITY): Payer: Self-pay | Admitting: *Deleted

## 2014-07-13 DIAGNOSIS — Z841 Family history of disorders of kidney and ureter: Secondary | ICD-10-CM

## 2014-07-13 DIAGNOSIS — I129 Hypertensive chronic kidney disease with stage 1 through stage 4 chronic kidney disease, or unspecified chronic kidney disease: Secondary | ICD-10-CM | POA: Diagnosis not present

## 2014-07-13 DIAGNOSIS — E785 Hyperlipidemia, unspecified: Secondary | ICD-10-CM | POA: Diagnosis present

## 2014-07-13 DIAGNOSIS — Z538 Procedure and treatment not carried out for other reasons: Secondary | ICD-10-CM | POA: Diagnosis not present

## 2014-07-13 DIAGNOSIS — Z79899 Other long term (current) drug therapy: Secondary | ICD-10-CM

## 2014-07-13 DIAGNOSIS — N19 Unspecified kidney failure: Secondary | ICD-10-CM | POA: Diagnosis not present

## 2014-07-13 DIAGNOSIS — Z808 Family history of malignant neoplasm of other organs or systems: Secondary | ICD-10-CM | POA: Diagnosis not present

## 2014-07-13 DIAGNOSIS — Z8249 Family history of ischemic heart disease and other diseases of the circulatory system: Secondary | ICD-10-CM | POA: Diagnosis not present

## 2014-07-13 DIAGNOSIS — N2889 Other specified disorders of kidney and ureter: Secondary | ICD-10-CM | POA: Diagnosis present

## 2014-07-13 DIAGNOSIS — N184 Chronic kidney disease, stage 4 (severe): Secondary | ICD-10-CM | POA: Diagnosis present

## 2014-07-13 DIAGNOSIS — N269 Renal sclerosis, unspecified: Secondary | ICD-10-CM | POA: Diagnosis present

## 2014-07-13 LAB — BASIC METABOLIC PANEL
Anion gap: 8 (ref 5–15)
BUN: 52 mg/dL — ABNORMAL HIGH (ref 6–23)
CHLORIDE: 116 meq/L — AB (ref 96–112)
CO2: 19 mmol/L (ref 19–32)
Calcium: 9.3 mg/dL (ref 8.4–10.5)
Creatinine, Ser: 3.25 mg/dL — ABNORMAL HIGH (ref 0.50–1.10)
GFR calc Af Amer: 18 mL/min — ABNORMAL LOW (ref >90)
GFR, EST NON AFRICAN AMERICAN: 16 mL/min — AB (ref >90)
GLUCOSE: 87 mg/dL (ref 70–99)
Potassium: 6.1 mmol/L (ref 3.5–5.1)
Sodium: 143 mmol/L (ref 135–145)

## 2014-07-13 LAB — TYPE AND SCREEN
ABO/RH(D): O POS
Antibody Screen: NEGATIVE

## 2014-07-13 LAB — POCT I-STAT 4, (NA,K, GLUC, HGB,HCT)
Glucose, Bld: 87 mg/dL (ref 70–99)
HCT: 30 % — ABNORMAL LOW (ref 36.0–46.0)
HEMOGLOBIN: 10.2 g/dL — AB (ref 12.0–15.0)
POTASSIUM: 6.1 mmol/L — AB (ref 3.5–5.1)
Sodium: 142 mmol/L (ref 135–145)

## 2014-07-13 SURGERY — NEPHRECTOMY, RADICAL, LAPAROSCOPIC, ADULT
Anesthesia: General | Laterality: Right

## 2014-07-13 MED ORDER — FUROSEMIDE 40 MG PO TABS
20.0000 mg | ORAL_TABLET | Freq: Every day | ORAL | Status: DC
Start: 2014-07-13 — End: 2015-02-12

## 2014-07-13 MED ORDER — CISATRACURIUM BESYLATE 20 MG/10ML IV SOLN
INTRAVENOUS | Status: AC
  Filled 2014-07-13: qty 10

## 2014-07-13 MED ORDER — NEOSTIGMINE METHYLSULFATE 10 MG/10ML IV SOLN
INTRAVENOUS | Status: AC
  Filled 2014-07-13: qty 1

## 2014-07-13 MED ORDER — FENTANYL CITRATE 0.05 MG/ML IJ SOLN
INTRAMUSCULAR | Status: AC
  Filled 2014-07-13: qty 5

## 2014-07-13 MED ORDER — GLYCOPYRROLATE 0.2 MG/ML IJ SOLN
INTRAMUSCULAR | Status: AC
  Filled 2014-07-13: qty 3

## 2014-07-13 MED ORDER — SODIUM POLYSTYRENE SULFONATE 15 GM/60ML PO SUSP
60.0000 g | Freq: Once | ORAL | Status: AC
  Administered 2014-07-13: 30 g via ORAL
  Filled 2014-07-13: qty 240

## 2014-07-13 MED ORDER — CEFAZOLIN SODIUM-DEXTROSE 2-3 GM-% IV SOLR: 2.0000 g | INTRAVENOUS | Status: DC

## 2014-07-13 MED ORDER — FUROSEMIDE 40 MG PO TABS
40.0000 mg | ORAL_TABLET | Freq: Two times a day (BID) | ORAL | Status: DC
  Administered 2014-07-13: 40 mg via ORAL
  Filled 2014-07-13 (×3): qty 1

## 2014-07-13 MED ORDER — LACTATED RINGERS IV SOLN: INTRAVENOUS | Status: DC

## 2014-07-13 MED ORDER — MIDAZOLAM HCL 2 MG/2ML IJ SOLN
INTRAMUSCULAR | Status: AC
  Filled 2014-07-13: qty 2

## 2014-07-13 MED ORDER — ONDANSETRON HCL 4 MG/2ML IJ SOLN
INTRAMUSCULAR | Status: AC
  Filled 2014-07-13: qty 2

## 2014-07-13 MED ORDER — LIDOCAINE HCL (CARDIAC) 20 MG/ML IV SOLN
INTRAVENOUS | Status: AC
  Filled 2014-07-13: qty 5

## 2014-07-13 MED ORDER — CEFAZOLIN SODIUM-DEXTROSE 2-3 GM-% IV SOLR
INTRAVENOUS | Status: AC
  Filled 2014-07-13: qty 50

## 2014-07-13 MED ORDER — PROPOFOL 10 MG/ML IV BOLUS
INTRAVENOUS | Status: AC
  Filled 2014-07-13: qty 20

## 2014-07-13 MED ORDER — SODIUM CHLORIDE 0.9 % IV SOLN
INTRAVENOUS | Status: DC
  Administered 2014-07-13: 1000 mL via INTRAVENOUS

## 2014-07-13 NOTE — H&P (Signed)
Reason For Visit right renal mass   History of Present Illness 49F referred by Dr. [name], MD for evaluation and management of right renal mass. Her PCP is Dr. Domenick Gong.  Patient's renal mass was discovered as part of evaluation of right upper quadrant pain and was palpated on PE. Non-contrast CT scan in the ED was performed revealing a heterogenous 7.5cm mass on the lower pole of the right kidney with peripheral calcifications. Patient has FSG renal failure and has had progression recently with last GFR around 12-14. This has prompted discussion of Renal Transplant and more imminent initiating dialysis, which understandably the patient is quite anxious about.  The patient states that she has had significant improvement in her right sided abdominal pain. She denies any gross hematuria or progressive voiding symptoms. She denies any flank pain. She states she has lost proximally 7 pounds over the last several weeks. She has no family history of kidney cancer but an extensive family history of kidney disease, or other is currently on dialysis.     Most recent labs performed in ED  05/16/14 -   BUN/CR 73/3.76  LFTs WNL   Past Medical History Problems  1. History of Chronic kidney disease, stage IV (severe) (N18.4) 2. History of Focal segmental glomerulosclerosis (N26.9) 3. History of chronic kidney disease (Z87.448) 4. History of hyperkalemia (Z86.39) 5. History of hyperlipidemia (Z86.39) 6. History of hypertension (Z86.79) 7. History of kidney disease [id number])  Surgical History Problems  1. History of No Surgical Problems 2. History of Surgical Excision Of Tubal Pregnancy  Current Meds 1. AmLODIPine Besylate 10 MG Oral Tablet;  Therapy: (Recorded:23Nov2015) to Recorded 2. Atorvastatin Calcium 20 MG Oral Tablet;  Therapy: (Recorded:23Nov2015) to Recorded 3. Bystolic 10 MG Oral Tablet;  Therapy: (Recorded:23Nov2015) to Recorded 4. Furosemide 40 MG Oral  Tablet;  Therapy: (Recorded:23Nov2015) to Recorded 5. HydrALAZINE HCl - 25 MG Oral Tablet;  Therapy: (Recorded:23Nov2015) to Recorded 6. Tylenol 500 MG CAPS;  Therapy: (Recorded:23Nov2015) to Recorded 7. Vitamin D 1000 UNIT Oral Tablet;  Therapy: (Recorded:23Nov2015) to Recorded  Allergies Medication  1. No Known Drug Allergies  Family History Problems  1. Family history of Death of family member : Mother   at age 91; throat cancer 2. Family history of hypertension (Z82.49) : Father 3. Family history of renal failure (Z84.1) : Father 4. Family history of Focal segmental glomerulosclerosis : Grandmother 5. Family history of Throat cancer : Mother  Social History Problems    Alcohol use (F10.99)   2   Caffeine use (F15.90)   1-2   Never a smoker   Occupation   Educational psychologist   Single  Review of Systems  Genitourinary: nocturia.  Gastrointestinal: nausea and vomiting.  Hematologic/Lymphatic: a tendency to easily bruise.  Respiratory: shortness of breath.  Musculoskeletal: back pain.  Neurological: headache.    Vitals Vital Signs [Data Includes: Last 1 Day]  Recorded: 23Nov2015 01:37PM  Height: 5 ft 5 in Weight: 123 lb  BMI Calculated: 20.47 BSA Calculated: 1.61 Blood Pressure: 153 / 70 Temperature: 97.9 F Heart Rate: 82  Physical Exam Constitutional: Well nourished and well developed . No acute distress.  ENT:. The ears and nose are normal in appearance.  Neck: The appearance of the neck is normal and no neck mass is present.  Pulmonary: No respiratory distress and normal respiratory rhythm and effort.  Cardiovascular: Heart rate and rhythm are normal . No peripheral edema.  Abdomen: The abdomen is soft and nontender. right upper quadrant a mass  is palpable. No CVA tenderness. No hernias are palpable. No hepatosplenomegaly noted.  Lymphatics: The femoral and inguinal nodes are not enlarged or tender.  Skin: Normal skin turgor, no visible rash and no  visible skin lesions.  Neuro/Psych:. Mood and affect are appropriate.    Results/Data Urine [Data Includes: Last 1 Day]   [id number]  COLOR YELLOW   APPEARANCE CLEAR   SPECIFIC GRAVITY 1.025   pH 5.0   GLUCOSE NEG mg/dL  BILIRUBIN NEG   KETONE NEG mg/dL  BLOOD NEG   PROTEIN 100 mg/dL  UROBILINOGEN 0.2 mg/dL  NITRITE NEG   LEUKOCYTE ESTERASE NEG   SQUAMOUS EPITHELIAL/HPF RARE   WBC 0-2 WBC/hpf  RBC 0-2 RBC/hpf  BACTERIA RARE   CRYSTALS NONE SEEN   CASTS NONE SEEN    The following images/tracing/specimen were independently visualized: .   non-contrast CT a/p 05/16/14:    7.8 cm heterogeneous appearing right mid to lower pole renal mass  with peripheral calcifications. This is difficult to fully  characterized without intravenous contrast but is concerning for  solid renal mass/ malignancy. Recommend further evaluation with  renal protocol CT or MRI.    Assessment Assessed  1. Right renal mass (N28.89)  The patient has a heterogeneous 7.8 cm mass involving the entire lower aspect of the right kidney with peripheral calcification which is highly concerning for renal cell carcinoma.  The CT scan is without contrast but even without contrast is highly suspicious for renal cell. Her renal function precludes obtaining any contrast enhanced imaging.  Unfortunately, given the size and the location of the tumor it is not amenable to partial nephrectomy. I worry that removal of her right kidney will likely render her dialysis dependent. However, given the size of the mass and its risk for metastasizing I don't think it is safe to place the patient on active surveillance. Plan Health Maintenance  1. UA With REFLEX; [Do Not Release]; Status:Complete;   DoneET:[id number] 01:13PM Right renal mass  2. Follow-up Office  Follow-up - will call patient to discuss surgery in the next day or two.   Status: Hold For - Appointment,Date of Service  Requested for: [phone] 3. Follow-up  Schedule Surgery Office  Follow-up  Status: Hold For - Appointment   Requested for: [phone] 4. INTERVENTIONAL RADIOLOGY; Status:Hold For - Appointment,PreCert,Print,Records;  Requested for:23Nov2015;   Discussion/Summary We discussed the natural history of renal masses and the treatment options in her case. The size of the mass and /or the central location prevent a safe attempt at a partial nephrectomy. As such, we did discuss laparoscopic radical nephrectomy as the best treatment option. I went over the operation in detail with the patient including the location of the 4 port sites. I also described the extraction incision. We discussed the risks of the operation including damage to the surrounding structures including all major vessels and nerves, bleeding, infection, and conversion to open surgery. I then described the expected postoperative course which will include 2 or 3 nights in the hospital. After hearing an understanding process the patient has agreed to proceed with the operation.

## 2014-07-13 NOTE — Progress Notes (Signed)
Pt. In holding room to prep for nephrectomy as scheduled.  Asked by Dr. Lissa Hoard to perform Istat to checkpotassium level.  Istat done with result of K+6.1.  Results reported and new sample drawn to send to lab for confirmation.  Results confirmed 30 mins. Later at 6.1.  Surgery cancelled per Dr. Lissa Hoard and Dr. Louis Meckel.  Dr. Louis Meckel will discuss further treatment and disposition of pt. With nephrologist.

## 2014-07-13 NOTE — Progress Notes (Addendum)
Patient discharged to home with Kayexalate and lasix 40 mg in hand. To see Nephrology on 07/16/14 for labs and surgery to be rescheduled when potassium level is normal. Patient verbalizes understanding. To car via wheelchair.

## 2014-07-13 NOTE — Discharge Instructions (Signed)
Take Kayexalate as prescribed. Resume Furosemide 20mg  twice daily.  We will contact you with your new surgery time and date soon.  Please contact [facility] with questions or concerns.

## 2014-07-13 NOTE — Progress Notes (Signed)
Dr. Doran Durand in to talk with patient and family. Will prescribe Kayexalate for dose now and repeat in four hours and dosing for Lasix 40mg  po now and restart bid.  Pt. To call nephrologist's office to set up lab time for Monday, and follow up with Dr. Carlton Adam office to reschedule surgery.  First dioses of med given as ordered.  Iv d/c'ed.  Pt to Short Stay to dress for discharge to home.

## 2014-07-13 NOTE — Interval H&P Note (Signed)
History and Physical Interval Note: Fistula created for HD several days ago, thrill palpable. No changes. Plan to proceed with lap right radical nephrectomy.  07/13/2014 10:57 AM  [name]  has presented today for surgery, with the diagnosis of right renal mass  The various methods of treatment have been discussed with the patient and family. After consideration of risks, benefits and other options for treatment, the patient has consented to  Procedure(s): RIGHT LAPAROSCOPIC RADICAL NEPHRECTOMY (Right) as a surgical intervention .  The patient's history has been reviewed, patient examined, no change in status, stable for surgery.  I have reviewed the patient's chart and labs.  Questions were answered to the patient's satisfaction.     Louis Meckel W

## 2014-07-13 NOTE — Anesthesia Preprocedure Evaluation (Addendum)
Anesthesia Evaluation  Patient identified by MRN, date of birth, ID band Patient awake    Reviewed: Allergy & Precautions, NPO status , Patient's Chart, lab work & pertinent test results, reviewed documented beta blocker date and time   Airway Mallampati: II   Neck ROM: Full    Dental  (+) Teeth Intact   Pulmonary neg pulmonary ROS,  breath sounds clear to auscultation        Cardiovascular hypertension, Pt. on medications and Pt. on home beta blockers Rhythm:Regular     Neuro/Psych negative neurological ROS     GI/Hepatic negative GI ROS, Neg liver ROS,   Endo/Other  negative endocrine ROS  Renal/GU ESRFRenal diseaseGFR 15, Has R sided renal cell CA to be removed 07/13/2014, for dialysis access 07/11/2013     Musculoskeletal   Abdominal (+)  Abdomen: soft.    Peds  Hematology  (+) anemia , H/H  9.5/31   Anesthesia Other Findings   Reproductive/Obstetrics                           Anesthesia Physical  Anesthesia Plan  ASA: III  Anesthesia Plan: General   Post-op Pain Management:    Induction: Intravenous  Airway Management Planned: Oral ETT  Additional Equipment: Arterial line  Intra-op Plan:   Post-operative Plan: Extubation in OR  Informed Consent: I have reviewed the patients History and Physical, chart, labs and discussed the procedure including the risks, benefits and alternatives for the proposed anesthesia with the patient or authorized representative who has indicated his/her understanding and acceptance.   Dental advisory given  Plan Discussed with: CRNA  Anesthesia Plan Comments: (2 x PIV  Repeat K 6.1. Discussed with Dr. Louis Meckel and patient that she should be evaluated by nephrology and possibly dialyzed prior to OR. The patient and Dr. Louis Meckel understand and agree.)      Anesthesia Quick Evaluation

## 2014-07-13 NOTE — Progress Notes (Signed)
Patient's surgery was canceled today because of a preop potassium of 6.1.  I have discussed her circumstance with Dr. Mercy Berntsen from nephrology.  Collectively we have opted to give her Kayexalate 30 mg now and then repeat in 4 hours.  In addition I started the patient on Lasix 20 mg twice daily.  She will then get labs in the nephrology clinic on Monday.  We will work to get her rescheduled for her right radical nephrectomy and a timely manner.

## 2014-07-19 ENCOUNTER — Encounter (HOSPITAL_COMMUNITY): Payer: Self-pay | Admitting: Urology

## 2014-07-20 NOTE — Patient Instructions (Addendum)
Terry Juarez  07/20/2014   Your procedure is scheduled on: 07/25/14   Report to [facility] Main  Entrance and follow signs to               [place] at 6:30  AM.   Call this number if you have problems the morning of surgery [phone]   Remember:  Do not eat food or drink liquids :After Midnight.     Take these medicines the morning of surgery with A SIP OF WATER: hydralazine / may take hydrocodone if neeeded                               You may not have any metal on your body including hair pins and              piercings  Do not wear jewelry, make-up, lotions, powders or perfumes.             Do not wear nail polish.  Do not shave  48 hours prior to surgery.              Men may shave face and neck.   Do not bring valuables to the hospital. [place].  Contacts, dentures or bridgework may not be worn into surgery.  Leave suitcase in the car. After surgery it may be brought to your room.     Patients discharged the day of surgery will not be allowed to drive home.  Name and phone number of your driver:  Special Instructions: N/A              Please read over the following fact sheets you were given: _____________________________________________________________________                                                     [place]  Before surgery, you can play an important role.  Because skin is not sterile, your skin needs to be as free of germs as possible.  You can reduce the number of germs on your skin by washing with CHG (chlorahexidine gluconate) soap before surgery.  CHG is an antiseptic cleaner which kills germs and bonds with the skin to continue killing germs even after washing. Please DO NOT use if you have an allergy to CHG or antibacterial soaps.  If your skin becomes reddened/irritated stop using the CHG and inform your nurse when you arrive at Short  Stay. Do not shave (including legs and underarms) for at least 48 hours prior to the first CHG shower.  You may shave your face. Please follow these instructions carefully:   1.  Shower with CHG Soap the night before surgery and the  morning of Surgery.   2.  If you choose to wash your hair, wash your hair first as usual with your  normal  Shampoo.   3.  After you shampoo, rinse your hair and body thoroughly to remove the  shampoo.  4.  Use CHG as you would any other liquid soap.  You can apply chg directly  to the skin and wash . Gently wash with scrungie or clean wascloth    5.  Apply the CHG Soap to your body ONLY FROM THE NECK DOWN.   Do not use on open                           Wound or open sores. Avoid contact with eyes, ears mouth and genitals (private parts).                        Genitals (private parts) with your normal soap.              6.  Wash thoroughly, paying special attention to the area where your surgery  will be performed.   7.  Thoroughly rinse your body with warm water from the neck down.   8.  DO NOT shower/wash with your normal soap after using and rinsing off  the CHG Soap .                9.  Pat yourself dry with a clean towel.             10.  Wear clean pajamas.             11.  Place clean sheets on your bed the night of your first shower and do not  sleep with pets.  Day of Surgery : Do not apply any lotions/deodorants the morning of surgery.  Please wear clean clothes to the hospital/surgery center.  FAILURE TO FOLLOW THESE INSTRUCTIONS MAY RESULT IN THE CANCELLATION OF YOUR SURGERY    PATIENT SIGNATURE_________________________________  ______________________________________________________________________    WHAT IS A BLOOD TRANSFUSION? Blood Transfusion Information  A transfusion is the replacement of blood or some of its parts. Blood is made up of multiple cells which provide different  functions.  Red blood cells carry oxygen and are used for blood loss replacement.  White blood cells fight against infection.  Platelets control bleeding.  Plasma helps clot blood.  Other blood products are available for specialized needs, such as hemophilia or other clotting disorders. BEFORE THE TRANSFUSION  Who gives blood for transfusions?   Healthy volunteers who are fully evaluated to make sure their blood is safe. This is blood bank blood. Transfusion therapy is the safest it has ever been in the practice of medicine. Before blood is taken from a donor, a complete history is taken to make sure that person has no history of diseases nor engages in risky social behavior (examples are intravenous drug use or sexual activity with multiple partners). The donor's travel history is screened to minimize risk of transmitting infections, such as malaria. The donated blood is tested for signs of infectious diseases, such as HIV and hepatitis. The blood is then tested to be sure it is compatible with you in order to minimize the chance of a transfusion reaction. If you or a relative donates blood, this is often done in anticipation of surgery and is not appropriate for emergency situations. It takes many days to process the donated blood. RISKS AND COMPLICATIONS Although transfusion therapy is very safe and saves many lives, the main dangers of transfusion include:   Getting an infectious disease.  Developing a transfusion reaction. This is an allergic reaction to something in the blood you were given. Every  precaution is taken to prevent this. The decision to have a blood transfusion has been considered carefully by your caregiver before blood is given. Blood is not given unless the benefits outweigh the risks. AFTER THE TRANSFUSION  Right after receiving a blood transfusion, you will usually feel much better and more energetic. This is especially true if your red blood cells have gotten low  (anemic). The transfusion raises the level of the red blood cells which carry oxygen, and this usually causes an energy increase.  The nurse administering the transfusion will monitor you carefully for complications. HOME CARE INSTRUCTIONS  No special instructions are needed after a transfusion. You may find your energy is better. Speak with your caregiver about any limitations on activity for underlying diseases you may have. SEEK MEDICAL CARE IF:   Your condition is not improving after your transfusion.  You develop redness or irritation at the intravenous (IV) site. SEEK IMMEDIATE MEDICAL CARE IF:  Any of the following symptoms occur over the next 12 hours:  Shaking chills.  You have a temperature by mouth above 102 F (38.9 C), not controlled by medicine.  Chest, back, or muscle pain.  People around you feel you are not acting correctly or are confused.  Shortness of breath or difficulty breathing.  Dizziness and fainting.  You get a rash or develop hives.  You have a decrease in urine output.  Your urine turns a dark color or changes to pink, red, or brown. Any of the following symptoms occur over the next 10 days:  You have a temperature by mouth above 102 F (38.9 C), not controlled by medicine.  Shortness of breath.  Weakness after normal activity.  The white part of the eye turns yellow (jaundice).  You have a decrease in the amount of urine or are urinating less often.  Your urine turns a dark color or changes to pink, red, or brown. Document Released: 06/19/2000 Document Revised: 09/14/2011 Document Reviewed: 02/06/2008 [facility] Patient Information 2014 [place], [place].  _______________________________________________________________________

## 2014-07-23 ENCOUNTER — Encounter (HOSPITAL_COMMUNITY)
Admission: RE | Admit: 2014-07-23 | Discharge: 2014-07-23 | Disposition: A | Discharge status: Home / Self Care | Payer: 59 | Source: Ambulatory Visit | Attending: Urology | Admitting: Urology

## 2014-07-23 ENCOUNTER — Encounter (HOSPITAL_COMMUNITY): Payer: Self-pay

## 2014-07-23 LAB — CBC
HCT: 33.3 % — ABNORMAL LOW (ref 36.0–46.0)
Hemoglobin: 10.2 g/dL — ABNORMAL LOW (ref 12.0–15.0)
MCH: 30.1 pg (ref 26.0–34.0)
MCHC: 30.6 g/dL (ref 30.0–36.0)
MCV: 98.2 fL (ref 78.0–100.0)
Platelets: 411 10*3/uL — ABNORMAL HIGH (ref 150–400)
RBC: 3.39 MIL/uL — ABNORMAL LOW (ref 3.87–5.11)
RDW: 12.5 % (ref 11.5–15.5)
WBC: 9 10*3/uL (ref 4.0–10.5)

## 2014-07-23 LAB — BASIC METABOLIC PANEL
ANION GAP: 12 (ref 5–15)
BUN: 88 mg/dL — ABNORMAL HIGH (ref 6–23)
CALCIUM: 9.5 mg/dL (ref 8.4–10.5)
CHLORIDE: 109 meq/L (ref 96–112)
CO2: 22 mmol/L (ref 19–32)
CREATININE: 3.69 mg/dL — AB (ref 0.50–1.10)
GFR calc Af Amer: 16 mL/min — ABNORMAL LOW (ref >90)
GFR calc non Af Amer: 13 mL/min — ABNORMAL LOW (ref >90)
Glucose, Bld: 96 mg/dL (ref 70–99)
Potassium: 5 mmol/L (ref 3.5–5.1)
Sodium: 143 mmol/L (ref 135–145)

## 2014-07-23 NOTE — Progress Notes (Signed)
Abnormal BMET faxed to Dr. Louis Meckel to both main fax number and to POD fax number

## 2014-07-25 ENCOUNTER — Encounter (HOSPITAL_COMMUNITY): Payer: Self-pay | Admitting: Certified Registered"

## 2014-07-25 ENCOUNTER — Inpatient Hospital Stay (HOSPITAL_COMMUNITY): Payer: 59 | Admitting: Certified Registered"

## 2014-07-25 ENCOUNTER — Encounter (HOSPITAL_COMMUNITY)
Admission: RE | Disposition: A | Discharge status: Home / Self Care | Payer: Self-pay | Source: Ambulatory Visit | Attending: Urology

## 2014-07-25 ENCOUNTER — Inpatient Hospital Stay (HOSPITAL_COMMUNITY)
Admission: RE | Admit: 2014-07-25 | Discharge: 2014-07-28 | DRG: 657 | Disposition: A | Discharge status: Home / Self Care | Payer: 59 | Source: Ambulatory Visit | Attending: Urology | Admitting: Urology

## 2014-07-25 DIAGNOSIS — D638 Anemia in other chronic diseases classified elsewhere: Secondary | ICD-10-CM | POA: Diagnosis present

## 2014-07-25 DIAGNOSIS — N2889 Other specified disorders of kidney and ureter: Secondary | ICD-10-CM | POA: Diagnosis present

## 2014-07-25 DIAGNOSIS — I120 Hypertensive chronic kidney disease with stage 5 chronic kidney disease or end stage renal disease: Secondary | ICD-10-CM | POA: Diagnosis present

## 2014-07-25 DIAGNOSIS — D62 Acute posthemorrhagic anemia: Secondary | ICD-10-CM | POA: Diagnosis not present

## 2014-07-25 DIAGNOSIS — E785 Hyperlipidemia, unspecified: Secondary | ICD-10-CM | POA: Diagnosis present

## 2014-07-25 DIAGNOSIS — C641 Malignant neoplasm of right kidney, except renal pelvis: Secondary | ICD-10-CM | POA: Diagnosis present

## 2014-07-25 DIAGNOSIS — N185 Chronic kidney disease, stage 5: Secondary | ICD-10-CM | POA: Diagnosis present

## 2014-07-25 DIAGNOSIS — Z79899 Other long term (current) drug therapy: Secondary | ICD-10-CM | POA: Diagnosis not present

## 2014-07-25 HISTORY — PX: LAPAROSCOPIC NEPHRECTOMY: SHX1930

## 2014-07-25 LAB — CBC
HEMATOCRIT: 29.7 % — AB (ref 36.0–46.0)
Hemoglobin: 9.2 g/dL — ABNORMAL LOW (ref 12.0–15.0)
MCH: 30.7 pg (ref 26.0–34.0)
MCHC: 31 g/dL (ref 30.0–36.0)
MCV: 99 fL (ref 78.0–100.0)
PLATELETS: 354 10*3/uL (ref 150–400)
RBC: 3 MIL/uL — ABNORMAL LOW (ref 3.87–5.11)
RDW: 12.6 % (ref 11.5–15.5)
WBC: 11.8 10*3/uL — AB (ref 4.0–10.5)

## 2014-07-25 LAB — BASIC METABOLIC PANEL
ANION GAP: 10 (ref 5–15)
BUN: 75 mg/dL — ABNORMAL HIGH (ref 6–23)
CO2: 22 mmol/L (ref 19–32)
Calcium: 8.8 mg/dL (ref 8.4–10.5)
Chloride: 108 mEq/L (ref 96–112)
Creatinine, Ser: 3.9 mg/dL — ABNORMAL HIGH (ref 0.50–1.10)
GFR calc Af Amer: 15 mL/min — ABNORMAL LOW (ref >90)
GFR calc non Af Amer: 13 mL/min — ABNORMAL LOW (ref >90)
GLUCOSE: 120 mg/dL — AB (ref 70–99)
Potassium: 4.2 mmol/L (ref 3.5–5.1)
Sodium: 140 mmol/L (ref 135–145)

## 2014-07-25 LAB — TYPE AND SCREEN
ABO/RH(D): O POS
Antibody Screen: NEGATIVE

## 2014-07-25 SURGERY — NEPHRECTOMY, RADICAL, LAPAROSCOPIC, ADULT
Anesthesia: General | Laterality: Right

## 2014-07-25 MED ORDER — HYDROCODONE-ACETAMINOPHEN 5-325 MG PO TABS
1.0000 | ORAL_TABLET | Freq: Four times a day (QID) | ORAL | Status: DC | PRN
Start: 2014-07-25 — End: 2014-07-28
  Administered 2014-07-26 – 2014-07-28 (×4): 1 via ORAL
  Filled 2014-07-25 (×4): qty 1

## 2014-07-25 MED ORDER — DEXAMETHASONE SODIUM PHOSPHATE 10 MG/ML IJ SOLN
INTRAMUSCULAR | Status: AC
  Filled 2014-07-25: qty 1

## 2014-07-25 MED ORDER — ZOLPIDEM TARTRATE 5 MG PO TABS: 5.0000 mg | ORAL_TABLET | Freq: Every evening | ORAL | Status: DC | PRN

## 2014-07-25 MED ORDER — PHENYLEPHRINE 40 MCG/ML (10ML) SYRINGE FOR IV PUSH (FOR BLOOD PRESSURE SUPPORT)
PREFILLED_SYRINGE | INTRAVENOUS | Status: AC
  Filled 2014-07-25: qty 10

## 2014-07-25 MED ORDER — LACTATED RINGERS IV SOLN: INTRAVENOUS | Status: DC

## 2014-07-25 MED ORDER — BUPIVACAINE LIPOSOME 1.3 % IJ SUSP
INTRAMUSCULAR | Status: DC | PRN
  Administered 2014-07-25: 40 mL

## 2014-07-25 MED ORDER — PROPOFOL 10 MG/ML IV BOLUS
INTRAVENOUS | Status: AC
  Filled 2014-07-25: qty 20

## 2014-07-25 MED ORDER — FENTANYL CITRATE 0.05 MG/ML IJ SOLN
INTRAMUSCULAR | Status: AC
  Filled 2014-07-25: qty 5

## 2014-07-25 MED ORDER — ROCURONIUM BROMIDE 100 MG/10ML IV SOLN
INTRAVENOUS | Status: DC | PRN
  Administered 2014-07-25 (×2): 10 mg via INTRAVENOUS
  Administered 2014-07-25: 40 mg via INTRAVENOUS

## 2014-07-25 MED ORDER — HEPARIN SODIUM (PORCINE) 5000 UNIT/ML IJ SOLN
5000.0000 [IU] | Freq: Three times a day (TID) | INTRAMUSCULAR | Status: DC
  Administered 2014-07-25 – 2014-07-28 (×9): 5000 [IU] via SUBCUTANEOUS
  Filled 2014-07-25 (×11): qty 1

## 2014-07-25 MED ORDER — BUPIVACAINE HCL 0.25 % IJ SOLN
INTRAMUSCULAR | Status: DC | PRN
  Administered 2014-07-25: 20 mL

## 2014-07-25 MED ORDER — CALCITRIOL 0.25 MCG PO CAPS
0.2500 ug | ORAL_CAPSULE | ORAL | Status: DC
  Administered 2014-07-25 – 2014-07-27 (×2): 0.25 ug via ORAL
  Filled 2014-07-25 (×2): qty 1

## 2014-07-25 MED ORDER — PHENYLEPHRINE HCL 10 MG/ML IJ SOLN
INTRAMUSCULAR | Status: DC | PRN
  Administered 2014-07-25 (×2): 80 ug via INTRAVENOUS

## 2014-07-25 MED ORDER — PROPOFOL 10 MG/ML IV BOLUS
INTRAVENOUS | Status: DC | PRN
  Administered 2014-07-25: 120 mg via INTRAVENOUS

## 2014-07-25 MED ORDER — DOCUSATE SODIUM 100 MG PO CAPS
100.0000 mg | ORAL_CAPSULE | Freq: Two times a day (BID) | ORAL | Status: DC
  Administered 2014-07-25 – 2014-07-28 (×6): 100 mg via ORAL
  Filled 2014-07-25 (×8): qty 1

## 2014-07-25 MED ORDER — SODIUM CHLORIDE 0.9 % IJ SOLN
INTRAMUSCULAR | Status: AC
  Filled 2014-07-25: qty 50

## 2014-07-25 MED ORDER — HYDROMORPHONE HCL 1 MG/ML IJ SOLN
INTRAMUSCULAR | Status: DC | PRN
  Administered 2014-07-25 (×2): 1 mg via INTRAVENOUS

## 2014-07-25 MED ORDER — ONDANSETRON HCL 4 MG PO TABS: 4.0000 mg | ORAL_TABLET | Freq: Three times a day (TID) | ORAL | Status: DC | PRN

## 2014-07-25 MED ORDER — SODIUM CHLORIDE 0.9 % IJ SOLN
INTRAMUSCULAR | Status: AC
  Filled 2014-07-25: qty 10

## 2014-07-25 MED ORDER — ATORVASTATIN CALCIUM 20 MG PO TABS
20.0000 mg | ORAL_TABLET | Freq: Every day | ORAL | Status: DC
  Administered 2014-07-25 – 2014-07-27 (×3): 20 mg via ORAL
  Filled 2014-07-25 (×4): qty 1

## 2014-07-25 MED ORDER — BUPIVACAINE LIPOSOME 1.3 % IJ SUSP
20.0000 mL | Freq: Once | INTRAMUSCULAR | Status: DC
  Filled 2014-07-25: qty 20

## 2014-07-25 MED ORDER — CEFAZOLIN SODIUM-DEXTROSE 2-3 GM-% IV SOLR
2.0000 g | INTRAVENOUS | Status: AC
  Administered 2014-07-25: 2 g via INTRAVENOUS

## 2014-07-25 MED ORDER — CEFAZOLIN SODIUM-DEXTROSE 2-3 GM-% IV SOLR
INTRAVENOUS | Status: AC
  Filled 2014-07-25: qty 50

## 2014-07-25 MED ORDER — LIDOCAINE HCL (CARDIAC) 20 MG/ML IV SOLN
INTRAVENOUS | Status: AC
  Filled 2014-07-25: qty 5

## 2014-07-25 MED ORDER — BISACODYL 10 MG RE SUPP: 10.0000 mg | Freq: Every day | RECTAL | Status: DC | PRN

## 2014-07-25 MED ORDER — ONDANSETRON HCL 4 MG/2ML IJ SOLN
INTRAMUSCULAR | Status: DC | PRN
  Administered 2014-07-25: 4 mg via INTRAVENOUS

## 2014-07-25 MED ORDER — SODIUM CHLORIDE 0.45 % IV SOLN
INTRAVENOUS | Status: DC
  Administered 2014-07-25 – 2014-07-26 (×2): via INTRAVENOUS

## 2014-07-25 MED ORDER — VITAMIN D3 25 MCG (1000 UNIT) PO TABS
1000.0000 [IU] | ORAL_TABLET | ORAL | Status: DC
  Administered 2014-07-25 – 2014-07-27 (×2): 1000 [IU] via ORAL
  Filled 2014-07-25 (×2): qty 1

## 2014-07-25 MED ORDER — FENTANYL CITRATE 0.05 MG/ML IJ SOLN
INTRAMUSCULAR | Status: DC | PRN
  Administered 2014-07-25: 50 ug via INTRAVENOUS
  Administered 2014-07-25: 100 ug via INTRAVENOUS

## 2014-07-25 MED ORDER — PANTOPRAZOLE SODIUM 40 MG IV SOLR
40.0000 mg | INTRAVENOUS | Status: DC
  Administered 2014-07-25 (×2): 40 mg via INTRAVENOUS
  Filled 2014-07-25 (×3): qty 40

## 2014-07-25 MED ORDER — MEPERIDINE HCL 50 MG/ML IJ SOLN: 6.2500 mg | INTRAMUSCULAR | Status: DC | PRN

## 2014-07-25 MED ORDER — MIDAZOLAM HCL 5 MG/5ML IJ SOLN
INTRAMUSCULAR | Status: DC | PRN
  Administered 2014-07-25: 2 mg via INTRAVENOUS

## 2014-07-25 MED ORDER — ROCURONIUM BROMIDE 100 MG/10ML IV SOLN
INTRAVENOUS | Status: AC
  Filled 2014-07-25: qty 1

## 2014-07-25 MED ORDER — LIDOCAINE HCL (PF) 2 % IJ SOLN
INTRAMUSCULAR | Status: DC | PRN
  Administered 2014-07-25: 40 mg via INTRADERMAL

## 2014-07-25 MED ORDER — BUPIVACAINE HCL (PF) 0.25 % IJ SOLN
INTRAMUSCULAR | Status: AC
  Filled 2014-07-25: qty 30

## 2014-07-25 MED ORDER — ONDANSETRON HCL 4 MG/2ML IJ SOLN: 4.0000 mg | Freq: Four times a day (QID) | INTRAMUSCULAR | Status: DC | PRN

## 2014-07-25 MED ORDER — EPHEDRINE SULFATE 50 MG/ML IJ SOLN
INTRAMUSCULAR | Status: DC | PRN
  Administered 2014-07-25: 10 mg via INTRAVENOUS

## 2014-07-25 MED ORDER — GLYCOPYRROLATE 0.2 MG/ML IJ SOLN
INTRAMUSCULAR | Status: DC | PRN
  Administered 2014-07-25: 0.6 mg via INTRAVENOUS

## 2014-07-25 MED ORDER — EPHEDRINE SULFATE 50 MG/ML IJ SOLN
INTRAMUSCULAR | Status: AC
  Filled 2014-07-25: qty 1

## 2014-07-25 MED ORDER — PROMETHAZINE HCL 25 MG/ML IJ SOLN: 6.2500 mg | INTRAMUSCULAR | Status: DC | PRN

## 2014-07-25 MED ORDER — HYDROMORPHONE HCL 1 MG/ML IJ SOLN: 0.2500 mg | INTRAMUSCULAR | Status: DC | PRN

## 2014-07-25 MED ORDER — DEXAMETHASONE SODIUM PHOSPHATE 10 MG/ML IJ SOLN
INTRAMUSCULAR | Status: DC | PRN
  Administered 2014-07-25: 10 mg via INTRAVENOUS

## 2014-07-25 MED ORDER — ACETAMINOPHEN 10 MG/ML IV SOLN
1000.0000 mg | Freq: Four times a day (QID) | INTRAVENOUS | Status: AC
  Administered 2014-07-25 – 2014-07-26 (×4): 1000 mg via INTRAVENOUS
  Filled 2014-07-25 (×6): qty 100

## 2014-07-25 MED ORDER — HYDROMORPHONE HCL 2 MG/ML IJ SOLN
INTRAMUSCULAR | Status: AC
  Filled 2014-07-25: qty 1

## 2014-07-25 MED ORDER — LACTATED RINGERS IV SOLN
INTRAVENOUS | Status: DC | PRN
  Administered 2014-07-25: 08:00:00 via INTRAVENOUS

## 2014-07-25 MED ORDER — MORPHINE SULFATE 10 MG/ML IJ SOLN: 2.0000 mg | INTRAMUSCULAR | Status: DC | PRN

## 2014-07-25 MED ORDER — NEOSTIGMINE METHYLSULFATE 10 MG/10ML IV SOLN
INTRAVENOUS | Status: DC | PRN
  Administered 2014-07-25: 3 mg via INTRAVENOUS

## 2014-07-25 MED ORDER — GLYCOPYRROLATE 0.2 MG/ML IJ SOLN
INTRAMUSCULAR | Status: AC
  Filled 2014-07-25: qty 3

## 2014-07-25 MED ORDER — ONDANSETRON HCL 4 MG/2ML IJ SOLN
INTRAMUSCULAR | Status: AC
  Filled 2014-07-25: qty 2

## 2014-07-25 MED ORDER — MIDAZOLAM HCL 2 MG/2ML IJ SOLN
INTRAMUSCULAR | Status: AC
  Filled 2014-07-25: qty 2

## 2014-07-25 MED ORDER — NEBIVOLOL HCL 10 MG PO TABS
10.0000 mg | ORAL_TABLET | Freq: Every day | ORAL | Status: DC
  Administered 2014-07-25 – 2014-07-27 (×3): 10 mg via ORAL
  Filled 2014-07-25 (×5): qty 1

## 2014-07-25 SURGICAL SUPPLY — 55 items
APPLICATOR SURGIFLO ENDO (HEMOSTASIS) IMPLANT
APPLIER CLIP ROT 10 11.4 M/L (STAPLE)
BAG ZIPLOCK 12X15 (MISCELLANEOUS) IMPLANT
BLADE EXTENDED COATED 6.5IN (ELECTRODE) IMPLANT
BLADE SURG SZ10 CARB STEEL (BLADE) ×3 IMPLANT
CHLORAPREP W/TINT 26ML (MISCELLANEOUS) ×3 IMPLANT
CLIP APPLIE ROT 10 11.4 M/L (STAPLE) IMPLANT
CLIP LIGATING HEM O LOK PURPLE (MISCELLANEOUS) ×3 IMPLANT
CLIP LIGATING HEMO LOK XL GOLD (MISCELLANEOUS) IMPLANT
CLIP LIGATING HEMO O LOK GREEN (MISCELLANEOUS) ×6 IMPLANT
COVER SURGICAL LIGHT HANDLE (MISCELLANEOUS) ×3 IMPLANT
CUTTER FLEX LINEAR 45M (STAPLE) ×3 IMPLANT
DRAIN CHANNEL 10F 3/8 F FF (DRAIN) IMPLANT
DRAPE INCISE IOBAN 66X45 STRL (DRAPES) ×3 IMPLANT
DRAPE LAPAROSCOPIC ABDOMINAL (DRAPES) ×3 IMPLANT
DRAPE WARM FLUID 44X44 (DRAPE) IMPLANT
ELECT REM PT RETURN 9FT ADLT (ELECTROSURGICAL) ×3
ELECTRODE REM PT RTRN 9FT ADLT (ELECTROSURGICAL) ×1 IMPLANT
EVACUATOR SILICONE 100CC (DRAIN) IMPLANT
FLOSEAL 10ML (HEMOSTASIS) IMPLANT
GLOVE BIOGEL M STRL SZ7.5 (GLOVE) ×21 IMPLANT
GOWN STRL REUS W/TWL LRG LVL3 (GOWN DISPOSABLE) ×9 IMPLANT
HEMOSTAT SURGICEL 4X8 (HEMOSTASIS) IMPLANT
KIT BASIN OR (CUSTOM PROCEDURE TRAY) ×3 IMPLANT
LIQUID BAND (GAUZE/BANDAGES/DRESSINGS) ×3 IMPLANT
MANIFOLD NEPTUNE II (INSTRUMENTS) ×3 IMPLANT
NEEDLE SPNL 22GX2.5 QUINCKE BK (NEEDLE) ×3 IMPLANT
PENCIL BUTTON HOLSTER BLD 10FT (ELECTRODE) ×3 IMPLANT
POSITIONER SURGICAL ARM (MISCELLANEOUS) ×3 IMPLANT
POUCH ENDO CATCH II 15MM (MISCELLANEOUS) ×3 IMPLANT
RELOAD 45 VASCULAR/THIN (ENDOMECHANICALS) ×6 IMPLANT
RELOAD STAPLE TA45 3.5 REG BLU (ENDOMECHANICALS) IMPLANT
RETRACTOR LAPSCP 12X46 CVD (ENDOMECHANICALS) IMPLANT
RTRCTR LAPSCP 12X46 CVD (ENDOMECHANICALS)
SCISSORS LAP 5X35 DISP (ENDOMECHANICALS) IMPLANT
SET IRRIG TUBING LAPAROSCOPIC (IRRIGATION / IRRIGATOR) ×3 IMPLANT
SHEARS HARMONIC ACE PLUS 36CM (ENDOMECHANICALS) ×3 IMPLANT
SLEEVE XCEL OPT CAN 5 100 (ENDOMECHANICALS) ×6 IMPLANT
SPONGE LAP 4X18 X RAY DECT (DISPOSABLE) ×3 IMPLANT
SPONGE SURGIFOAM ABS GEL 100 (HEMOSTASIS) IMPLANT
SUT ETHILON 3 0 PS 1 (SUTURE) IMPLANT
SUT MNCRL AB 4-0 PS2 18 (SUTURE) ×6 IMPLANT
SUT PDS AB 0 CT1 36 (SUTURE) IMPLANT
SUT PDS AB 0 CTX 60 (SUTURE) ×3 IMPLANT
SUT VIC AB 0 CT1 36 (SUTURE) ×3 IMPLANT
SUT VIC AB 2-0 CT1 27 (SUTURE)
SUT VIC AB 2-0 CT1 27XBRD (SUTURE) IMPLANT
SUT VICRYL 0 UR6 27IN ABS (SUTURE) ×6 IMPLANT
TOWEL OR 17X26 10 PK STRL BLUE (TOWEL DISPOSABLE) ×3 IMPLANT
TRAY FOLEY CATH 16FR SILVER (SET/KITS/TRAYS/PACK) ×3 IMPLANT
TRAY FOLEY CATH 16FRSI W/METER (SET/KITS/TRAYS/PACK) IMPLANT
TRAY LAPAROSCOPIC (CUSTOM PROCEDURE TRAY) ×3 IMPLANT
TROCAR BLADELESS OPT 5 100 (ENDOMECHANICALS) ×3 IMPLANT
TROCAR XCEL 12X100 BLDLESS (ENDOMECHANICALS) ×6 IMPLANT
YANKAUER SUCT BULB TIP 10FT TU (MISCELLANEOUS) ×3 IMPLANT

## 2014-07-25 NOTE — Anesthesia Preprocedure Evaluation (Addendum)
Anesthesia Evaluation  Patient identified by MRN, date of birth, ID band Patient awake    Reviewed: Allergy & Precautions, H&P , NPO status , Patient's Chart, lab work & pertinent test results, reviewed documented beta blocker date and time   Airway Mallampati: II  TM Distance: >3 FB Neck ROM: Full    Dental no notable dental hx. (+) Teeth Intact, Dental Advisory Given   Pulmonary neg pulmonary ROS,  breath sounds clear to auscultation  Pulmonary exam normal       Cardiovascular hypertension, Pt. on medications and Pt. on home beta blockers Rhythm:Regular Rate:Normal     Neuro/Psych negative neurological ROS  negative psych ROS   GI/Hepatic negative GI ROS, Neg liver ROS,   Endo/Other  negative endocrine ROS  Renal/GU Renal disease  negative genitourinary   Musculoskeletal   Abdominal   Peds  Hematology negative hematology ROS (+)   Anesthesia Other Findings   Reproductive/Obstetrics negative OB ROS                            Anesthesia Physical Anesthesia Plan  ASA: II  Anesthesia Plan: General   Post-op Pain Management:    Induction: Intravenous  Airway Management Planned: Oral ETT  Additional Equipment:   Intra-op Plan:   Post-operative Plan: Extubation in OR  Informed Consent: I have reviewed the patients History and Physical, chart, labs and discussed the procedure including the risks, benefits and alternatives for the proposed anesthesia with the patient or authorized representative who has indicated his/her understanding and acceptance.   Dental advisory given  Plan Discussed with: CRNA  Anesthesia Plan Comments:         Anesthesia Quick Evaluation

## 2014-07-25 NOTE — Op Note (Signed)
Preoperative Diagnosis: Right renal mass, CKD  Postoperative Diagnosis:  same  Procedure(s) Performed:  1. Laparoscopic right nephrectomy  Teaching Surgeon:  Burman Nieves, MD  Resident Surgeon:  Marta Antu, MD  Assistant(s):  none  Anesthesia:  General via endotracheal tube  Fluids:  See anesthesia record  UOP: unable to obtain  Estimated blood loss:  20 mL  Specimens:  Right kidney  Drains:  [id number] foley  Complications:  none  Indications: 48yF with CKD baseline cre of 3.5 with recent fistula placement and incidentally discovered right renal mass. Biopsy was inconclusive. Nephrology and patient heavily considering transplant. After discussion of risks/benefits/alternative therapies, patient elected for the above procedure.  Findings:   1. Two arteries. One smaller caliber and anterior, taken with hemolock clips. One superior, taken with stapler. One branching vein, taken with vascular stapler. 2. Adrenal sparing. 3. No lymphadenopathy or signs of metastasis.  The patient was placed in right flank position with her right arm on a floating arm board. She was taped to the table. A tilt test showed no movement. Her abdomen was prepped with chloroprep and she was draped in sterile fashion. A foley catheter was placed.   A 31mm incision was made in supraumbilical region as the eventual camera port. This was placed in hassan fashion. A 60mm port was inserted and the abdomen was inspected. There were no entrance injuries. There were no adhesions. The remainder of our ports were placed with right arm (62mm) just under costal margin, left arm 7cm lateral to camera (64mm). A 23mm assistant port was placed in the RLQ. A 19mm port for liver retraction was placed in the epigastric region.   We began by lateralizing the descending colon and freeing up some of the hepatorenal ligaments. The mesentery of the colon was dissected off gerotas bluntly. The ureter were identified, and  dissected underneath until psoas was encountered. This allowed kidney lift. We dissected tissue pedicles cephalad until the hilum was encountered. There was an anterior branch of artery, which was taken with clips and cut with scissors. A larger caliber artery was found more superiorly. This was taken with staple fire. We dissected superiorly to the renal vein to allow a staple fire, which was done without incident.  The remaining superior and lateral attachments were taken with harmonic and blunt dissection.   The tail of gerotas was incised. The specimen was freed and captured in an endocatch bag through the left hand 35mm port site.  A TAPS block was performed in her RLQ.   The 43mm assistant port and the 14mm left hand ports were connected to allow specimen extraction. Inferior fascia was closed with 0 vicryl (interrupted figure of eight), external fascia was closed with 0-PDS in running fashion and skin was closed with 4-0 monocryl. His 4mm camera port was closed with 2-0 vicryl in figure of eight fashion. Skin was closed with 4-0 monocryl.   The patient was awoken from anesthesia having tolerated the procedure moderately well.   Post Op Plan:   1. Admit to urology. 2. Nephrology to dialyze 1/21. 3. May discharge on POD#1, if pain well controlled.  Attestation:  Dr. Louis Meckel was present and scrubbed for the entirety of the procedure.    Iley Deignan "Jed" Glo Herring, MD Resident, Department of Urology

## 2014-07-25 NOTE — Anesthesia Postprocedure Evaluation (Signed)
  Anesthesia Post-op Note  Patient: Terry Juarez  Procedure(s) Performed: Procedure(s): RIGHT LAPAROSCOPIC RADICAL NEPHRECTOMY  (Right)  Patient Location: PACU  Anesthesia Type:General  Level of Consciousness: awake and alert   Airway and Oxygen Therapy: Patient Spontanous Breathing  Post-op Pain: none  Post-op Assessment: Post-op Vital signs reviewed, Patient's Cardiovascular Status Stable and Respiratory Function Stable  Post-op Vital Signs: Reviewed  Filed Vitals:   07/25/14 1200  BP: 103/58  Pulse: 67  Temp: 36.4 C  Resp: 16    Complications: No apparent anesthesia complications

## 2014-07-25 NOTE — Anesthesia Procedure Notes (Signed)
Procedure Name: Intubation Date/Time: 07/25/2014 8:41 AM Performed by: Lajuana Carry E Pre-anesthesia Checklist: Patient identified, Emergency Drugs available, Suction available and Patient being monitored Patient Re-evaluated:Patient Re-evaluated prior to inductionOxygen Delivery Method: Circle System Utilized Preoxygenation: Pre-oxygenation with 100% oxygen Intubation Type: IV induction Ventilation: Mask ventilation without difficulty Laryngoscope Size: Miller and 2 Grade View: Grade II Tube type: Oral Tube size: 7.0 mm Number of attempts: 1 Airway Equipment and Method: Stylet Placement Confirmation: ETT inserted through vocal cords under direct vision,  positive ETCO2 and breath sounds checked- equal and bilateral Secured at: 21 cm Tube secured with: Tape Dental Injury: Teeth and Oropharynx as per pre-operative assessment

## 2014-07-25 NOTE — Interval H&P Note (Signed)
History and Physical Interval Note:  07/25/2014 8:02 AM  Terry Juarez  has presented today for surgery, with the diagnosis of RIGHT RENAL MASS  The various methods of treatment have been discussed with the patient and family. After consideration of risks, benefits and other options for treatment, the patient has consented to  Procedure(s): RIGHT LAPAROSCOPIC RADICAL NEPHRECTOMY  (Right) as a surgical intervention .  The patient's history has been reviewed, patient examined, no change in status, stable for surgery.  I have reviewed the patient's chart and labs.  Questions were answered to the patient's satisfaction.     Louis Meckel W

## 2014-07-25 NOTE — H&P (View-Only) (Signed)
Patient's surgery was canceled today because of a preop potassium of 6.1.  I have discussed her circumstance with Dr. Mercy Carey from nephrology.  Collectively we have opted to give her Kayexalate 30 mg now and then repeat in 4 hours.  In addition I started the patient on Lasix 20 mg twice daily.  She will then get labs in the nephrology clinic on Monday.  We will work to get her rescheduled for her right radical nephrectomy and a timely manner.

## 2014-07-25 NOTE — Transfer of Care (Signed)
Immediate Anesthesia Transfer of Care Note  Patient: Terry Juarez  Procedure(s) Performed: Procedure(s) (LRB): RIGHT LAPAROSCOPIC RADICAL NEPHRECTOMY  (Right)  Patient Location: PACU  Anesthesia Type: General  Level of Consciousness: sedated, patient cooperative and responds to stimulation  Airway & Oxygen Therapy: Patient Spontanous Breathing and Patient connected to face mask oxgen  Post-op Assessment: Report given to PACU RN and Post -op Vital signs reviewed and stable  Post vital signs: Reviewed and stable  Complications: No apparent anesthesia complications

## 2014-07-26 ENCOUNTER — Encounter (HOSPITAL_COMMUNITY): Payer: Self-pay | Admitting: Urology

## 2014-07-26 LAB — CBC
HCT: 25.8 % — ABNORMAL LOW (ref 36.0–46.0)
Hemoglobin: 8.2 g/dL — ABNORMAL LOW (ref 12.0–15.0)
MCH: 31.3 pg (ref 26.0–34.0)
MCHC: 31.8 g/dL (ref 30.0–36.0)
MCV: 98.5 fL (ref 78.0–100.0)
PLATELETS: 308 10*3/uL (ref 150–400)
RBC: 2.62 MIL/uL — ABNORMAL LOW (ref 3.87–5.11)
RDW: 12.4 % (ref 11.5–15.5)
WBC: 15.6 10*3/uL — ABNORMAL HIGH (ref 4.0–10.5)

## 2014-07-26 LAB — BASIC METABOLIC PANEL
ANION GAP: 14 (ref 5–15)
BUN: 72 mg/dL — ABNORMAL HIGH (ref 6–23)
CALCIUM: 8.9 mg/dL (ref 8.4–10.5)
CO2: 18 mmol/L — ABNORMAL LOW (ref 19–32)
CREATININE: 4.7 mg/dL — AB (ref 0.50–1.10)
Chloride: 101 mEq/L (ref 96–112)
GFR, EST AFRICAN AMERICAN: 12 mL/min — AB (ref >90)
GFR, EST NON AFRICAN AMERICAN: 10 mL/min — AB (ref >90)
GLUCOSE: 123 mg/dL — AB (ref 70–99)
POTASSIUM: 4.5 mmol/L (ref 3.5–5.1)
Sodium: 133 mmol/L — ABNORMAL LOW (ref 135–145)

## 2014-07-26 MED ORDER — PANTOPRAZOLE SODIUM 40 MG PO TBEC
40.0000 mg | DELAYED_RELEASE_TABLET | Freq: Every day | ORAL | Status: DC
  Administered 2014-07-26 – 2014-07-28 (×3): 40 mg via ORAL
  Filled 2014-07-26 (×3): qty 1

## 2014-07-26 NOTE — Progress Notes (Signed)
Pharmacy - Brief Note: pantoprazole IV to PO  The patient is receiving Protonix by the intravenous route.  Based on criteria approved by the Pharmacy and [place], the medication is being converted to the equivalent oral dose form.  These criteria include: -No Active GI bleeding -Able to tolerate diet of full liquids (or better) or tube feeding -Able to tolerate other medications by the oral or enteral route  If you have any questions about this conversion, please contact the Pharmacy Department (phone [phone]).  Thank you.  Doreene Eland, PharmD, BCPS.   Pager: [phone] 07/26/2014 2:37 PM
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
## 2014-07-26 NOTE — Progress Notes (Signed)
Urology Inpatient Progress Report S/p right nephrectomy on 07/25/14  Intv/Subj: No acute events overnight. Patient is without complaint. Pain minimal Tolerating liquid diet without nausea Up and walking yesterday  Past Medical History  Diagnosis Date  . Hypertension   . Hyperlipidemia   . Bruises easily   . Right renal mass   . Anemia   . Renal disorder     rt renal mass / < functioning of left kidney - being prepared for possible dialysis   Current Facility-Administered Medications  Medication Dose Route Frequency Provider Last Rate Last Dose  . acetaminophen (OFIRMEV) IV 1,000 mg  1,000 mg Intravenous Q6H Ardis Hughs, MD   1,000 mg at 07/26/14 0128  . atorvastatin (LIPITOR) tablet 20 mg  20 mg Oral QHS Ardis Hughs, MD   20 mg at 07/25/14 2144  . bisacodyl (DULCOLAX) suppository 10 mg  10 mg Rectal Daily PRN Ardis Hughs, MD      . calcitRIOL (ROCALTROL) capsule 0.25 mcg  0.25 mcg Oral Q M,W,F-2000 Ardis Hughs, MD   0.25 mcg at 07/25/14 2144  . cholecalciferol (VITAMIN D) tablet 1,000 Units  1,000 Units Oral Once per day on Mon Wed Fri Ardis Hughs, MD   1,000 Units at 07/25/14 1804  . docusate sodium (COLACE) capsule 100 mg  100 mg Oral BID Ardis Hughs, MD   100 mg at 07/25/14 2144  . heparin injection 5,000 Units  5,000 Units Subcutaneous 3 times per day Ardis Hughs, MD   5,000 Units at 07/26/14 0502  . HYDROcodone-acetaminophen (NORCO/VICODIN) 5-325 MG per tablet 1 tablet  1 tablet Oral Q6H PRN Ardis Hughs, MD      . lactated ringers infusion   Intravenous Continuous Verlin Grills, CRNA      . morphine injection 2 mg  2 mg Intravenous Q1H PRN Ardis Hughs, MD      . nebivolol (BYSTOLIC) tablet 10 mg  10 mg Oral QHS Ardis Hughs, MD   10 mg at 07/25/14 2144  . ondansetron (ZOFRAN) injection 4 mg  4 mg Intravenous Q6H PRN Ardis Hughs, MD      . ondansetron [facility]) tablet 4 mg  4 mg Oral Q8H PRN Ardis Hughs, MD      . pantoprazole (PROTONIX) injection 40 mg  40 mg Intravenous Q24H Ardis Hughs, MD   40 mg at 07/25/14 2211  . zolpidem (AMBIEN) tablet 5 mg  5 mg Oral QHS PRN Ardis Hughs, MD         Objective: Vital: Filed Vitals:   07/25/14 1215 07/25/14 1230 07/25/14 2121 07/26/14 0436  BP: 100/54 114/62 111/55 119/67  Pulse: 64 71 70 66  Temp:  97.8 F (36.6 C) 98.4 F (36.9 C) 98.4 F (36.9 C)  TempSrc:  Oral Oral Oral  Resp: 16 14 16 16   Height:      Weight:      SpO2: 100% 100% 100% 100%   I/Os: I/O last 3 completed shifts: In: 2037.9 [P.O.:360; I.V.:1477.9; IV Piggyback:200] Out: 560 [Urine:560]  Physical Exam:  General: Patient is in no apparent distress Lungs: Normal respiratory effort, chest expands symmetrically. GI:  Incisions c/d/i, appropriately tender Ext: lower extremities symmetric  Lab Results:  Recent Labs  07/23/14 0920 07/25/14 1133 07/26/14 0442  WBC 9.0 11.8* 15.6*  HGB 10.2* 9.2* 8.2*  HCT 33.3* 29.7* 25.8*    Recent Labs  07/23/14 0920 07/25/14 1133 07/26/14 0442  NA 143 140 129*  K 5.0 4.2 4.5  CL 109 108 101  CO2 22 22 18*  GLUCOSE 96 120* 123*  BUN 88* 75* 72*  CREATININE 3.69* 3.90* 4.70*  CALCIUM 9.5 8.8 8.4   No results for input(s): LABPT, INR in the last 72 hours. No results for input(s): LABURIN in the last 72 hours. Results for orders placed or performed during the hospital encounter of 03/24/14  Rapid strep screen     Status: None   Collection Time: 03/24/14 11:51 AM  Result Value Ref Range Status   Streptococcus, Group A Screen (Direct) NEGATIVE NEGATIVE Final    Comment: (NOTE) A Rapid Antigen test may result negative if the antigen level in the sample is below the detection level of this test. The FDA has not cleared this test as a stand-alone test therefore the rapid antigen negative result has reflexed to a Group A Strep culture.  Culture, Group A Strep     Status: None   Collection Time:  03/24/14 11:51 AM  Result Value Ref Range Status   Specimen Description THROAT  Final   Special Requests NONE  Final   Culture   Final    No Beta Hemolytic Streptococci Isolated Performed at [facility]   Report Status 03/26/2014 FINAL  Final  Urine culture     Status: None   Collection Time: 03/24/14 12:45 PM  Result Value Ref Range Status   Specimen Description URINE, CLEAN CATCH  Final   Special Requests ADD [id number]  Final   Culture  Setup Time   Final    03/25/2014 02:47 Performed at [facility]   Final    >=100,000 COLONIES/ML Performed at [facility]   Culture   Final    Multiple bacterial morphotypes present, none predominant. Suggest appropriate recollection if clinically indicated. Performed at [facility]   Report Status 03/26/2014 FINAL  Final    Studies/Results:   Assessment: 1 Day Post-Op, pain controlled, hgb stable.  Creatinine rising, acidotic, low sodium. Plan: D/c foley Hep lock IV, encourage PO intake Oral pain meds Nephrology consult for initiation HD Maybe ready for d/c tomorrow AM, at which point she could then be transported to dialysis by car and then home.  Ardis Hughs 07/26/2014, 7:53 AM

## 2014-07-27 LAB — HEPATITIS B SURFACE ANTIGEN: Hepatitis B Surface Ag: NEGATIVE

## 2014-07-27 LAB — CBC
HCT: 26.5 % — ABNORMAL LOW (ref 36.0–46.0)
Hemoglobin: 8.2 g/dL — ABNORMAL LOW (ref 12.0–15.0)
MCH: 30.8 pg (ref 26.0–34.0)
MCHC: 30.9 g/dL (ref 30.0–36.0)
MCV: 99.6 fL (ref 78.0–100.0)
Platelets: 338 10*3/uL (ref 150–400)
RBC: 2.66 MIL/uL — AB (ref 3.87–5.11)
RDW: 12.6 % (ref 11.5–15.5)
WBC: 12 10*3/uL — AB (ref 4.0–10.5)

## 2014-07-27 LAB — RENAL FUNCTION PANEL
Albumin: 3.4 g/dL — ABNORMAL LOW (ref 3.5–5.2)
Anion gap: 11 (ref 5–15)
BUN: 80 mg/dL — ABNORMAL HIGH (ref 6–23)
CO2: 19 mmol/L (ref 19–32)
CREATININE: 5.42 mg/dL — AB (ref 0.50–1.10)
Calcium: 8.7 mg/dL (ref 8.4–10.5)
Chloride: 108 mEq/L (ref 96–112)
GFR calc Af Amer: 10 mL/min — ABNORMAL LOW (ref >90)
GFR calc non Af Amer: 8 mL/min — ABNORMAL LOW (ref >90)
Glucose, Bld: 127 mg/dL — ABNORMAL HIGH (ref 70–99)
Phosphorus: 4.8 mg/dL — ABNORMAL HIGH (ref 2.3–4.6)
Potassium: 4.8 mmol/L (ref 3.5–5.1)
SODIUM: 138 mmol/L (ref 135–145)

## 2014-07-27 MED ORDER — DSS 100 MG PO CAPS: 100.0000 mg | ORAL_CAPSULE | Freq: Two times a day (BID) | ORAL | Status: DC

## 2014-07-27 MED ORDER — HYDROCODONE-ACETAMINOPHEN 5-325 MG PO TABS: 1.0000 | ORAL_TABLET | Freq: Four times a day (QID) | ORAL | Status: DC | PRN
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
## 2014-07-27 NOTE — Discharge Instructions (Signed)

## 2014-07-28 LAB — BASIC METABOLIC PANEL
Anion gap: 6 (ref 5–15)
BUN: 76 mg/dL — ABNORMAL HIGH (ref 6–23)
CO2: 19 mmol/L (ref 19–32)
CREATININE: 5.16 mg/dL — AB (ref 0.50–1.10)
Calcium: 9 mg/dL (ref 8.4–10.5)
Chloride: 112 mmol/L (ref 96–112)
GFR calc Af Amer: 10 mL/min — ABNORMAL LOW (ref >90)
GFR calc non Af Amer: 9 mL/min — ABNORMAL LOW (ref >90)
GLUCOSE: 115 mg/dL — AB (ref 70–99)
POTASSIUM: 4.9 mmol/L (ref 3.5–5.1)
Sodium: 137 mmol/L (ref 135–145)

## 2014-07-28 NOTE — Progress Notes (Signed)
Urology Inpatient Progress Report S/p right nephrectomy on 07/25/14  Intv/Subj: No acute events overnight. Patient is without complaint. Pain minimal Tolerating regular diet  Past Medical History  Diagnosis Date  . Hypertension   . Hyperlipidemia   . Bruises easily   . Right renal mass   . Anemia   . Renal disorder     rt renal mass / < functioning of left kidney - being prepared for possible dialysis   Current Facility-Administered Medications  Medication Dose Route Frequency Provider Last Rate Last Dose  . atorvastatin (LIPITOR) tablet 20 mg  20 mg Oral QHS Ardis Hughs, MD   20 mg at 07/27/14 2101  . bisacodyl (DULCOLAX) suppository 10 mg  10 mg Rectal Daily PRN Ardis Hughs, MD      . calcitRIOL (ROCALTROL) capsule 0.25 mcg  0.25 mcg Oral Q M,W,F-2000 Ardis Hughs, MD   0.25 mcg at 07/27/14 2101  . cholecalciferol (VITAMIN D) tablet 1,000 Units  1,000 Units Oral Once per day on Mon Wed Fri Ardis Hughs, MD   1,000 Units at 07/27/14 0900  . docusate sodium (COLACE) capsule 100 mg  100 mg Oral BID Ardis Hughs, MD   100 mg at 07/27/14 2101  . heparin injection 5,000 Units  5,000 Units Subcutaneous 3 times per day Ardis Hughs, MD   5,000 Units at 07/28/14 0600  . HYDROcodone-acetaminophen (NORCO/VICODIN) 5-325 MG per tablet 1 tablet  1 tablet Oral Q6H PRN Ardis Hughs, MD   1 tablet at 07/28/14 0600  . morphine injection 2 mg  2 mg Intravenous Q1H PRN Ardis Hughs, MD      . nebivolol (BYSTOLIC) tablet 10 mg  10 mg Oral QHS Ardis Hughs, MD   10 mg at 07/27/14 2101  . ondansetron (ZOFRAN) injection 4 mg  4 mg Intravenous Q6H PRN Ardis Hughs, MD      . ondansetron [facility]) tablet 4 mg  4 mg Oral Q8H PRN Ardis Hughs, MD      . pantoprazole (PROTONIX) EC tablet 40 mg  40 mg Oral Daily Osa Craver [place], RPH   40 mg at 07/27/14 [id number]  . zolpidem (AMBIEN) tablet 5 mg  5 mg Oral QHS PRN Ardis Hughs, MD          Objective: Vital: Filed Vitals:   07/27/14 0442 07/27/14 1500 07/27/14 2122 07/28/14 0617  BP: 140/76 120/68 132/69 143/82  Pulse: 70 82 71 69  Temp: 98.3 F (36.8 C) 98.5 F (36.9 C) 98.6 F (37 C) 98.6 F (37 C)  TempSrc: Oral Oral Oral Oral  Resp: 18 20 18 18   Height:      Weight:      SpO2: 99% 98% 98% 98%   I/Os: I/O last 3 completed shifts: In: 1440 [P.O.:1440] Out: 3050 [Urine:3050]  Physical Exam:  General: Patient is in no apparent distress Lungs: Normal respiratory effort, chest expands symmetrically. GI:  Incisions c/d/i, appropriately tender Ext: lower extremities symmetric  Lab Results:  Recent Labs  07/25/14 1133 07/26/14 0442 07/27/14 0555  WBC 11.8* 15.6* 12.0*  HGB 9.2* 8.2* 8.2*  HCT 29.7* 25.8* 26.5*    Recent Labs  07/25/14 1133 07/26/14 0442 07/27/14 0555  NA 140 133* 138  K 4.2 4.5 4.8  CL 108 101 108  CO2 22 18* 19  GLUCOSE 120* 123* 127*  BUN 75* 72* 80*  CREATININE 3.90* 4.70* 5.42*  CALCIUM 8.8 8.9 8.7   No  results for input(s): LABPT, INR in the last 72 hours. No results for input(s): LABURIN in the last 72 hours. Results for orders placed or performed during the hospital encounter of 03/24/14  Rapid strep screen     Status: None   Collection Time: 03/24/14 11:51 AM  Result Value Ref Range Status   Streptococcus, Group A Screen (Direct) NEGATIVE NEGATIVE Final    Comment: (NOTE) A Rapid Antigen test may result negative if the antigen level in the sample is below the detection level of this test. The FDA has not cleared this test as a stand-alone test therefore the rapid antigen negative result has reflexed to a Group A Strep culture.  Culture, Group A Strep     Status: None   Collection Time: 03/24/14 11:51 AM  Result Value Ref Range Status   Specimen Description THROAT  Final   Special Requests NONE  Final   Culture   Final    No Beta Hemolytic Streptococci Isolated Performed at [facility]   Report  Status 03/26/2014 FINAL  Final  Urine culture     Status: None   Collection Time: 03/24/14 12:45 PM  Result Value Ref Range Status   Specimen Description URINE, CLEAN CATCH  Final   Special Requests ADD [id number]  Final   Culture  Setup Time   Final    03/25/2014 02:47 Performed at [place]   Final    >=100,000 COLONIES/ML Performed at [facility]   Culture   Final    Multiple bacterial morphotypes present, none predominant. Suggest appropriate recollection if clinically indicated. Performed at [facility]   Report Status 03/26/2014 FINAL  Final    Studies/Results:   Assessment: 2 days post-op, pain controlled, hgb stable.  Electrolytes stable, creatinine continuing to rise. Plan: Pain well controlled, eating without nausea/emesis.  Voiding on her own. Appreciate nephrology input, we will defer her discharge status to them.  Suspect that she'll need one additional day here to monitor serum chemistry.  Ardis Hughs 07/28/2014, 8:55 AM

## 2014-07-28 NOTE — Progress Notes (Signed)
Patient ID: Terry Juarez, female   DOB: 13-Aug-1965, 49 y.o.   MRN: [id number] S:feels well, no complaints, up in chair  O:BP 140/66 mmHg  Pulse 72  Temp(Src) 97.7 F (36.5 C) (Oral)  Resp 18  Ht 5\' 5"  (1.651 m)  Wt 53.524 kg (118 lb)  BMI 19.64 kg/m2  SpO2 98%  Intake/Output Summary (Last 24 hours) at 07/28/14 1553 Last data filed at 07/28/14 1014  Gross per 24 hour  Intake    960 ml  Output   1050 ml  Net    -90 ml   Intake/Output: I/O last 3 completed shifts: In: 1440 [P.O.:1440] Out: 3050 [Urine:3050]  Intake/Output this shift:  Total I/O In: 240 [P.O.:240] Out: -  Weight change:  Gen:WD WN AAF in NAD CVS:no rub Resp:cta [id number] Ext:no edema, LUE AVF +T/B   Recent Labs Lab 07/23/14 0920 07/25/14 1133 07/26/14 0442 07/27/14 0555 07/28/14 0858  NA 143 140 133* 138 137  K 5.0 4.2 4.5 4.8 4.9  CL 109 108 101 108 112  CO2 22 22 18* 19 19  GLUCOSE 96 120* 123* 127* 115*  BUN 88* 75* 72* 80* 76*  CREATININE 3.69* 3.90* 4.70* 5.42* 5.16*  ALBUMIN  --   --   --  3.4*  --   CALCIUM 9.5 8.8 8.9 8.7 9.0  PHOS  --   --   --  4.8*  --    Liver Function Tests:  Recent Labs Lab 07/27/14 0555  ALBUMIN 3.4*   No results for input(s): LIPASE, AMYLASE in the last 168 hours. No results for input(s): AMMONIA in the last 168 hours. CBC:  Recent Labs Lab 07/23/14 0920 07/25/14 1133 07/26/14 0442 07/27/14 0555  WBC 9.0 11.8* 15.6* 12.0*  HGB 10.2* 9.2* 8.2* 8.2*  HCT 33.3* 29.7* 25.8* 26.5*  MCV 98.2 99.0 98.5 99.6  PLT 411* 354 308 338   Cardiac Enzymes: No results for input(s): CKTOTAL, CKMB, CKMBINDEX, TROPONINI in the last 168 hours. CBG: No results for input(s): GLUCAP in the last 168 hours.  Iron Studies: No results for input(s): IRON, TIBC, TRANSFERRIN, FERRITIN in the last 72 hours. Studies/Results: No results found. Marland Kitchen atorvastatin  20 mg Oral QHS  . calcitRIOL  0.25 mcg Oral Q M,W,F-2000  . cholecalciferol  1,000 Units Oral Once per day on  Mon Wed Fri  . docusate sodium  100 mg Oral BID  . heparin subcutaneous  5,000 Units Subcutaneous 3 times per day  . nebivolol  10 mg Oral QHS  . pantoprazole  40 mg Oral Daily    BMET    Component Value Date/Time   NA 137 07/28/2014 0858   K 4.9 07/28/2014 0858   CL 112 07/28/2014 0858   CO2 19 07/28/2014 0858   GLUCOSE 115* 07/28/2014 0858   BUN 76* 07/28/2014 0858   CREATININE 5.16* 07/28/2014 0858   CALCIUM 9.0 07/28/2014 0858   GFRNONAA 9* 07/28/2014 0858   GFRAA 10* 07/28/2014 0858   CBC    Component Value Date/Time   WBC 12.0* 07/27/2014 0555   RBC 2.66* 07/27/2014 0555   HGB 8.2* 07/27/2014 0555   HCT 26.5* 07/27/2014 0555   PLT 338 07/27/2014 0555   MCV 99.6 07/27/2014 0555   MCH 30.8 07/27/2014 0555   MCHC 30.9 07/27/2014 0555   RDW 12.6 07/27/2014 0555   LYMPHSABS 1.7 05/16/2014 1635   MONOABS 0.6 05/16/2014 1635   EOSABS 0.1 05/16/2014 1635   BASOSABS 0.1 05/16/2014 1635  Assessment/Plan: 1. CKD stage 4 - now s/p R nephrectomy; creat up as expected, but has stabilized around 5. No uremic symptoms. Has dialysis arranged at OP center this coming Monday.  2. Renal mass- s/p elective lap nephrectomy 3. Vascular access- has RIJ TDC in place and maturing RUE AVF 4. HTN- stable 5. ABLA/Anemia of chronic disease- will resume ESA with HD 6. MBD- SHPTH- cont with meds and follow 7. Dispo - ok for DC today, she knows to go to OP HD unit on [address] this Monday 1/25 at 11:00 am for HD  [name] (pgr) [phone]    [phone] 07/28/2014, 3:56 PM

## 2014-07-28 NOTE — Progress Notes (Signed)
Urology Inpatient Progress Report S/p right nephrectomy on 07/25/14  Intv/Subj: No acute events overnight. Patient is without complaint. Pain minimal Tolerating reg diet without nausea Up and walking yesterday  Past Medical History  Diagnosis Date  . Hypertension   . Hyperlipidemia   . Bruises easily   . Right renal mass   . Anemia   . Renal disorder     rt renal mass / < functioning of left kidney - being prepared for possible dialysis   Current Facility-Administered Medications  Medication Dose Route Frequency Provider Last Rate Last Dose  . atorvastatin (LIPITOR) tablet 20 mg  20 mg Oral QHS Ardis Hughs, MD   20 mg at 07/27/14 2101  . bisacodyl (DULCOLAX) suppository 10 mg  10 mg Rectal Daily PRN Ardis Hughs, MD      . calcitRIOL (ROCALTROL) capsule 0.25 mcg  0.25 mcg Oral Q M,W,F-2000 Ardis Hughs, MD   0.25 mcg at 07/27/14 2101  . cholecalciferol (VITAMIN D) tablet 1,000 Units  1,000 Units Oral Once per day on Mon Wed Fri Ardis Hughs, MD   1,000 Units at 07/27/14 0900  . docusate sodium (COLACE) capsule 100 mg  100 mg Oral BID Ardis Hughs, MD   100 mg at 07/27/14 2101  . heparin injection 5,000 Units  5,000 Units Subcutaneous 3 times per day Ardis Hughs, MD   5,000 Units at 07/28/14 0600  . HYDROcodone-acetaminophen (NORCO/VICODIN) 5-325 MG per tablet 1 tablet  1 tablet Oral Q6H PRN Ardis Hughs, MD   1 tablet at 07/28/14 0600  . morphine injection 2 mg  2 mg Intravenous Q1H PRN Ardis Hughs, MD      . nebivolol (BYSTOLIC) tablet 10 mg  10 mg Oral QHS Ardis Hughs, MD   10 mg at 07/27/14 2101  . ondansetron (ZOFRAN) injection 4 mg  4 mg Intravenous Q6H PRN Ardis Hughs, MD      . ondansetron [facility]) tablet 4 mg  4 mg Oral Q8H PRN Ardis Hughs, MD      . pantoprazole (PROTONIX) EC tablet 40 mg  40 mg Oral Daily Osa Craver [place], RPH   40 mg at 07/27/14 [id number]  . zolpidem (AMBIEN) tablet 5 mg  5 mg Oral QHS  PRN Ardis Hughs, MD         Objective: Vital: Filed Vitals:   07/27/14 0442 07/27/14 1500 07/27/14 2122 07/28/14 0617  BP: 140/76 120/68 132/69 143/82  Pulse: 70 82 71 69  Temp: 98.3 F (36.8 C) 98.5 F (36.9 C) 98.6 F (37 C) 98.6 F (37 C)  TempSrc: Oral Oral Oral Oral  Resp: 18 20 18 18   Height:      Weight:      SpO2: 99% 98% 98% 98%   I/Os: I/O last 3 completed shifts: In: 1440 [P.O.:1440] Out: 3050 [Urine:3050]  Physical Exam:  General: Patient is in no apparent distress Lungs: Normal respiratory effort, chest expands symmetrically. GI:  Incisions c/d/i, appropriately tender Ext: lower extremities symmetric  Lab Results:  Recent Labs  07/25/14 1133 07/26/14 0442 07/27/14 0555  WBC 11.8* 15.6* 12.0*  HGB 9.2* 8.2* 8.2*  HCT 29.7* 25.8* 26.5*    Recent Labs  07/25/14 1133 07/26/14 0442 07/27/14 0555  NA 140 133* 138  K 4.2 4.5 4.8  CL 108 101 108  CO2 22 18* 19  GLUCOSE 120* 123* 127*  BUN 75* 72* 80*  CREATININE 3.90* 4.70* 5.42*  CALCIUM  8.8 8.9 8.7   No results for input(s): LABPT, INR in the last 72 hours. No results for input(s): LABURIN in the last 72 hours. Results for orders placed or performed during the hospital encounter of 03/24/14  Rapid strep screen     Status: None   Collection Time: 03/24/14 11:51 AM  Result Value Ref Range Status   Streptococcus, Group A Screen (Direct) NEGATIVE NEGATIVE Final    Comment: (NOTE) A Rapid Antigen test may result negative if the antigen level in the sample is below the detection level of this test. The FDA has not cleared this test as a stand-alone test therefore the rapid antigen negative result has reflexed to a Group A Strep culture.  Culture, Group A Strep     Status: None   Collection Time: 03/24/14 11:51 AM  Result Value Ref Range Status   Specimen Description THROAT  Final   Special Requests NONE  Final   Culture   Final    No Beta Hemolytic Streptococci Isolated Performed at  [facility]   Report Status 03/26/2014 FINAL  Final  Urine culture     Status: None   Collection Time: 03/24/14 12:45 PM  Result Value Ref Range Status   Specimen Description URINE, CLEAN CATCH  Final   Special Requests ADD [id number]  Final   Culture  Setup Time   Final    03/25/2014 02:47 Performed at [place]   Final    >=100,000 COLONIES/ML Performed at [facility]   Culture   Final    Multiple bacterial morphotypes present, none predominant. Suggest appropriate recollection if clinically indicated. Performed at [facility]   Report Status 03/26/2014 FINAL  Final    Studies/Results:   Assessment: 3 Days Post-Op, pain controlled, hgb stable.  Creatinine rising, acidotic, low sodium. Plan: Recovering quite well from lap nephrectomy. Here for medical optimization and monitoring of kidney function regarding timing of initiation of HD, per nephrology.  Will check BMP and touch base with nephrology.  Dispo per nephrology.  Margo Aye 07/28/2014, 9:58 AM

## 2014-07-28 NOTE — Discharge Summary (Signed)
  Date of admission: 07/25/2014  Date of discharge: 07/28/2014  Admission diagnosis: right renal mass, CKD  Discharge diagnosis: same  Secondary diagnoses: FSGS  History and Physical: For full details, please see admission history and physical. Briefly, Terry Juarez is a 49 y.o. year old patient with FSGS and preop Cre of 3.5. She was found to have an enhancing right renal mass that was biopsied but was nondiagnostic. She is a potential transplant candidate. She elected for R lap nephrectomy. Preoperatively, she had an AV fistula placed (still maturing), and a temp-cath for potential post-op HD if she should need it.Marland Kitchen   Hospital Course:  Her hospital course from a surgical standpoint was uncomplicated. Her pain was well controlled. We carefully monitored her for signs and symptoms of worsening renal failure and she was followed by nephrology in-house. She never required dialysis. On POD#4, her cre plateuad at 5.2 and her K and HCO3 were WNLs. She did not show signs of volume overload. She had been previously scheduled for first HD as an outpatient on 1/25. On 1/23, it was deemed that she was safe for discharge home with first HD on 1/25. She was overjoyed with this news, and discharged home.  Laboratory values:  Recent Labs  07/26/14 0442 07/27/14 0555  HGB 8.2* 8.2*  HCT 25.8* 26.5*    Disposition: Home  Discharge instruction: The patient was instructed to be ambulatory but to refrain from heavy lifting, strenuous activity, or driving.  Discharge medications:  See medication reconciliation..med  Followup:  Follow-up Information    Follow up with Ardis Hughs, MD On 08/02/2014.   Specialty:  Urology   Why:  9:30am   Contact information:   [place] [address] [phone]

## 2014-08-14 DIAGNOSIS — C649 Malignant neoplasm of unspecified kidney, except renal pelvis: Secondary | ICD-10-CM | POA: Insufficient documentation

## 2014-08-22 DIAGNOSIS — N041 Nephrotic syndrome with focal and segmental glomerular lesions: Secondary | ICD-10-CM | POA: Insufficient documentation

## 2014-08-22 DIAGNOSIS — D509 Iron deficiency anemia, unspecified: Secondary | ICD-10-CM | POA: Insufficient documentation

## 2014-08-22 DIAGNOSIS — R52 Pain, unspecified: Secondary | ICD-10-CM | POA: Insufficient documentation

## 2014-08-22 DIAGNOSIS — N2581 Secondary hyperparathyroidism of renal origin: Secondary | ICD-10-CM | POA: Insufficient documentation

## 2014-08-22 DIAGNOSIS — T829XXA Unspecified complication of cardiac and vascular prosthetic device, implant and graft, initial encounter: Secondary | ICD-10-CM | POA: Insufficient documentation

## 2014-08-27 ENCOUNTER — Encounter: Payer: Self-pay | Admitting: Vascular Surgery

## 2014-08-28 ENCOUNTER — Ambulatory Visit (HOSPITAL_COMMUNITY)
Admission: RE | Admit: 2014-08-28 | Discharge: 2014-08-28 | Disposition: A | Discharge status: Home / Self Care | Payer: 59 | Source: Ambulatory Visit | Attending: Vascular Surgery | Admitting: Vascular Surgery

## 2014-08-28 ENCOUNTER — Encounter: Payer: Self-pay | Admitting: Vascular Surgery

## 2014-08-28 ENCOUNTER — Ambulatory Visit (INDEPENDENT_AMBULATORY_CARE_PROVIDER_SITE_OTHER): Payer: Self-pay | Admitting: Vascular Surgery

## 2014-08-28 VITALS — BP 128/78 | HR 79 | Resp 14 | Ht 65.0 in | Wt 112.0 lb

## 2014-08-28 DIAGNOSIS — N186 End stage renal disease: Secondary | ICD-10-CM | POA: Diagnosis present

## 2014-08-28 DIAGNOSIS — Z4931 Encounter for adequacy testing for hemodialysis: Secondary | ICD-10-CM

## 2014-08-28 NOTE — Progress Notes (Signed)
Subjective:     Patient ID: Terry Juarez, female   DOB: 31-Jul-1965, 49 y.o.   MRN: [id number]  HPI this 49 year old female returns for follow-up regarding her right brachial-cephalic AV fistula which I created on 07/11/2014. Patient is currently on hemodialysis through a tunneled catheter in the right IJ. She has had some mild cramping in the right hand but no pain or numbness to speak of she states.   Review of Systems     Objective:   Physical Exam BP 128/78 mmHg  Pulse 79  Resp 14  Ht 5\' 5"  (1.651 m)  Wt 112 lb (50.803 kg)  BMI 18.64 kg/m2  Gen. well-developed well-nourished female no apparent stress alert and oriented 3 Right upper extremity examined with excellent pulse and thrill and brachial-cephalic AV fistula which is easily visualized beneath the skin up to the shoulder level. 1-2+ pulse in the ulnar artery palpable distally. Right hand appears adequately perfused with good capillary refill.  Today I ordered a duplex scan of the fistula which reveals excellent flow throughout the fistula with one small branch in the proximal upper arm.     Assessment:     Nicely functioning brachial cephalic AV fistula in patient currently on hemodialysis through tunneled catheter in right internal jugular vein    Plan:     Okay to use right brachial-cephalic AV fistula after 10/10/2014 and then if successful catheter can be removed If patient develops progressive pain and/or numbness in the right hand she will notify us

## 2014-08-29 DIAGNOSIS — R7309 Other abnormal glucose: Secondary | ICD-10-CM | POA: Insufficient documentation

## 2014-09-26 DIAGNOSIS — M1030 Gout due to renal impairment, unspecified site: Secondary | ICD-10-CM | POA: Insufficient documentation

## 2015-01-24 ENCOUNTER — Ambulatory Visit (HOSPITAL_COMMUNITY)
Admission: RE | Admit: 2015-01-24 | Discharge: 2015-01-24 | Disposition: A | Discharge status: Home / Self Care | Payer: 59 | Source: Ambulatory Visit | Attending: Urology | Admitting: Urology

## 2015-01-24 ENCOUNTER — Other Ambulatory Visit: Payer: Self-pay | Admitting: Urology

## 2015-01-24 DIAGNOSIS — N2889 Other specified disorders of kidney and ureter: Secondary | ICD-10-CM

## 2015-01-24 DIAGNOSIS — Z85528 Personal history of other malignant neoplasm of kidney: Secondary | ICD-10-CM | POA: Diagnosis present

## 2015-01-24 DIAGNOSIS — L988 Other specified disorders of the skin and subcutaneous tissue: Secondary | ICD-10-CM | POA: Diagnosis not present

## 2015-02-11 ENCOUNTER — Encounter (HOSPITAL_COMMUNITY): Payer: Self-pay | Admitting: *Deleted

## 2015-02-11 ENCOUNTER — Observation Stay (HOSPITAL_COMMUNITY)
Admission: EM | Admit: 2015-02-11 | Discharge: 2015-02-12 | Disposition: A | Discharge status: Home / Self Care | Payer: 59 | Attending: Internal Medicine | Admitting: Internal Medicine

## 2015-02-11 ENCOUNTER — Emergency Department (HOSPITAL_COMMUNITY): Payer: 59

## 2015-02-11 DIAGNOSIS — Z992 Dependence on renal dialysis: Secondary | ICD-10-CM | POA: Diagnosis not present

## 2015-02-11 DIAGNOSIS — N186 End stage renal disease: Secondary | ICD-10-CM

## 2015-02-11 DIAGNOSIS — N289 Disorder of kidney and ureter, unspecified: Secondary | ICD-10-CM | POA: Insufficient documentation

## 2015-02-11 DIAGNOSIS — Z79899 Other long term (current) drug therapy: Secondary | ICD-10-CM | POA: Insufficient documentation

## 2015-02-11 DIAGNOSIS — D649 Anemia, unspecified: Secondary | ICD-10-CM | POA: Diagnosis present

## 2015-02-11 DIAGNOSIS — R55 Syncope and collapse: Secondary | ICD-10-CM | POA: Diagnosis present

## 2015-02-11 DIAGNOSIS — R778 Other specified abnormalities of plasma proteins: Secondary | ICD-10-CM

## 2015-02-11 DIAGNOSIS — Z862 Personal history of diseases of the blood and blood-forming organs and certain disorders involving the immune mechanism: Secondary | ICD-10-CM | POA: Diagnosis not present

## 2015-02-11 DIAGNOSIS — R51 Headache: Principal | ICD-10-CM | POA: Insufficient documentation

## 2015-02-11 DIAGNOSIS — R7989 Other specified abnormal findings of blood chemistry: Secondary | ICD-10-CM | POA: Diagnosis present

## 2015-02-11 DIAGNOSIS — E785 Hyperlipidemia, unspecified: Secondary | ICD-10-CM | POA: Diagnosis not present

## 2015-02-11 DIAGNOSIS — H539 Unspecified visual disturbance: Secondary | ICD-10-CM | POA: Insufficient documentation

## 2015-02-11 DIAGNOSIS — I10 Essential (primary) hypertension: Secondary | ICD-10-CM | POA: Diagnosis present

## 2015-02-11 DIAGNOSIS — R519 Headache, unspecified: Secondary | ICD-10-CM | POA: Diagnosis present

## 2015-02-11 HISTORY — DX: Dependence on renal dialysis: Z99.2

## 2015-02-11 HISTORY — DX: End stage renal disease: N18.6

## 2015-02-11 LAB — CBC WITH DIFFERENTIAL/PLATELET
BASOS ABS: 0 10*3/uL (ref 0.0–0.1)
Basophils Relative: 0 % (ref 0–1)
Eosinophils Absolute: 0 10*3/uL (ref 0.0–0.7)
Eosinophils Relative: 0 % (ref 0–5)
HCT: 34.4 % — ABNORMAL LOW (ref 36.0–46.0)
Hemoglobin: 11.4 g/dL — ABNORMAL LOW (ref 12.0–15.0)
LYMPHS ABS: 0.7 10*3/uL (ref 0.7–4.0)
Lymphocytes Relative: 7 % — ABNORMAL LOW (ref 12–46)
MCH: 33.1 pg (ref 26.0–34.0)
MCHC: 33.1 g/dL (ref 30.0–36.0)
MCV: 100 fL (ref 78.0–100.0)
MONO ABS: 0.4 10*3/uL (ref 0.1–1.0)
MONOS PCT: 4 % (ref 3–12)
Neutro Abs: 8.6 10*3/uL — ABNORMAL HIGH (ref 1.7–7.7)
Neutrophils Relative %: 89 % — ABNORMAL HIGH (ref 43–77)
PLATELETS: 210 10*3/uL (ref 150–400)
RBC: 3.44 MIL/uL — AB (ref 3.87–5.11)
RDW: 13.2 % (ref 11.5–15.5)
WBC: 9.8 10*3/uL (ref 4.0–10.5)

## 2015-02-11 LAB — COMPREHENSIVE METABOLIC PANEL
ALBUMIN: 3.9 g/dL (ref 3.5–5.0)
ALT: 20 U/L (ref 14–54)
AST: 44 U/L — AB (ref 15–41)
Alkaline Phosphatase: 126 U/L (ref 38–126)
Anion gap: 13 (ref 5–15)
BUN: 22 mg/dL — ABNORMAL HIGH (ref 6–20)
CHLORIDE: 95 mmol/L — AB (ref 101–111)
CO2: 29 mmol/L (ref 22–32)
CREATININE: 3.58 mg/dL — AB (ref 0.44–1.00)
Calcium: 8 mg/dL — ABNORMAL LOW (ref 8.9–10.3)
GFR calc non Af Amer: 14 mL/min — ABNORMAL LOW (ref >60)
GFR, EST AFRICAN AMERICAN: 16 mL/min — AB (ref >60)
Glucose, Bld: 97 mg/dL (ref 65–99)
Potassium: 3.8 mmol/L (ref 3.5–5.1)
Sodium: 137 mmol/L (ref 135–145)
Total Bilirubin: 0.6 mg/dL (ref 0.3–1.2)
Total Protein: 7 g/dL (ref 6.5–8.1)

## 2015-02-11 LAB — I-STAT TROPONIN, ED: TROPONIN I, POC: 0.31 ng/mL — AB (ref 0.00–0.08)

## 2015-02-11 LAB — TROPONIN I: Troponin I: 0.32 ng/mL — ABNORMAL HIGH (ref <0.031)

## 2015-02-11 MED ORDER — DIPHENHYDRAMINE HCL 50 MG/ML IJ SOLN
25.0000 mg | Freq: Once | INTRAMUSCULAR | Status: AC
  Administered 2015-02-11: 25 mg via INTRAVENOUS
  Filled 2015-02-11: qty 1

## 2015-02-11 MED ORDER — METOCLOPRAMIDE HCL 5 MG/ML IJ SOLN
10.0000 mg | Freq: Once | INTRAMUSCULAR | Status: AC
Start: 2015-02-11 — End: 2015-02-11
  Administered 2015-02-11: 10 mg via INTRAVENOUS
  Filled 2015-02-11: qty 2

## 2015-02-11 NOTE — ED Notes (Signed)
Patient transported to CT 

## 2015-02-11 NOTE — ED Notes (Signed)
IV team at bedside 

## 2015-02-11 NOTE — ED Notes (Signed)
IV attempt x2.

## 2015-02-11 NOTE — ED Provider Notes (Signed)
CSN: [id number]     Arrival date & time 02/11/15  2010 History   First MD Initiated Contact with Patient 02/11/15 2153     Chief Complaint  Patient presents with  . Migraine     (Consider location/radiation/quality/duration/timing/severity/associated sxs/prior Treatment) HPI Terry Juarez is a 49 y.o. female was history of hypertension, hyperlipidemia, anemia, renal disorder on dialysis, presents to emergency department complaining of a headache. Patient states headache started during dialysis, at approximately 6 PM. Patient states headache started gradually. Pain is frontal, does not radiate. No neck pain. Reports some blurred vision, no dizziness, no nausea or vomiting, no numbness or weakness in extremities. Denies confusion or memory problems. Patient states after headache escalated she may have had a syncopal episode. She states "when headache became very bad was so out of that I didn't know what was going on around me." She states that she just remembers waking up with anyone standing around her." She states that she has had headaches during dialysis before but never this severe. She states analysis was stopped with 30 minutes to go. Apparently patient's blood pressure went up to 190 when headache started. Upon EMS arrival blood pressure is 150/90. She received Tylenol at the dialysis center.  Past Medical History  Diagnosis Date  . Hypertension   . Hyperlipidemia   . Bruises easily   . Right renal mass   . Anemia   . Renal disorder     rt renal mass / < functioning of left kidney - being prepared for possible dialysis   Past Surgical History  Procedure Laterality Date  . Ectopic pregnancy surgery  1987  . Insertion of dialysis catheter Right 07/11/2014    Procedure: INSERTION OF DIALYSIS CATHETER IN RIGHT INTERNAL JUGULAR ;  Surgeon: Mal Misty, MD;  Location: [place];  Service: Vascular;  Laterality: Right;  . Av fistula placement Right 07/11/2014    Procedure: ARTERIOVENOUS (AV)  FISTULA CREATION RIGHT ARM BRACHIO-CEPHALIC WITH ATTEMPTED RADIO-CEPHALIC (AV) FISTULA;  Surgeon: Mal Misty, MD;  Location: [place];  Service: Vascular;  Laterality: Right;  . Thrombectomy w/ embolectomy Right 07/11/2014    Procedure: THROMBECTOMY OF RIGHT RADIAL ARTERY  ;  Surgeon: Mal Misty, MD;  Location: [place];  Service: Vascular;  Laterality: Right;  . Patch angioplasty Right 07/11/2014    Procedure: PATCH ANGIOPLASTY OF RIGHT RADIAL ARTERY USING CEPHALIC VEIN.;  Surgeon: Mal Misty, MD;  Location: [place];  Service: Vascular;  Laterality: Right;  . Laparoscopic nephrectomy Right 07/25/2014    Procedure: RIGHT LAPAROSCOPIC RADICAL NEPHRECTOMY ;  Surgeon: Ardis Hughs, MD;  Location: [facility];  Service: Urology;  Laterality: Right;   Family History  Problem Relation Age of Onset  . Cancer Mother   . Hypertension Father    History  Substance Use Topics  . Smoking status: Never Smoker   . Smokeless tobacco: Never Used  . Alcohol Use: 0.0 oz/week    0 Standard drinks or equivalent per week     Comment: occ   OB History    No data available     Review of Systems  Constitutional: Negative for fever and chills.  Eyes: Positive for visual disturbance.  Respiratory: Negative for cough, chest tightness and shortness of breath.   Cardiovascular: Negative for chest pain, palpitations and leg swelling.  Gastrointestinal: Negative for nausea, vomiting, abdominal pain and diarrhea.  Genitourinary: Negative for dysuria, flank pain, vaginal bleeding, vaginal discharge, vaginal pain and pelvic pain.  Musculoskeletal:  Negative for myalgias, arthralgias, neck pain and neck stiffness.  Skin: Negative for rash.  Neurological: Positive for headaches. Negative for dizziness and weakness.  All other systems reviewed and are negative.     Allergies  Review of patient's allergies indicates no known allergies.  Home Medications   Prior to Admission medications   Medication Sig Start  Date End Date Taking? Authorizing Provider  acetaminophen (TYLENOL) 500 MG tablet Take 500 mg by mouth every 6 (six) hours as needed for moderate pain or headache.   Yes Historical Provider, MD  cholecalciferol (VITAMIN D) 1000 UNITS tablet Take 1,000 Units by mouth daily.    Yes Historical Provider, MD  FOSRENOL 1000 MG chewable tablet Chew 2,000 mg by mouth 3 (three) times daily with meals. 12/14/14  Yes Historical Provider, MD  lidocaine-prilocaine (EMLA) cream Apply 1 application topically every Monday, Wednesday, and Friday with hemodialysis. 02/09/15  Yes Historical Provider, MD  SENSIPAR 60 MG tablet Take 60 mg by mouth daily. 12/17/14  Yes Historical Provider, MD  amLODipine (NORVASC) 10 MG tablet Take 10 mg by mouth at bedtime.     Historical Provider, MD  atorvastatin (LIPITOR) 20 MG tablet Take 20 mg by mouth at bedtime.     Historical Provider, MD  calcitRIOL (ROCALTROL) 0.25 MCG capsule Take 0.25 mcg by mouth every Monday, Wednesday, and Friday at 8 PM.    Historical Provider, MD  docusate sodium 100 MG CAPS Take 100 mg by mouth 2 (two) times daily. Patient not taking: Reported on 02/11/2015 07/27/14   Ardis Hughs, MD  furosemide (LASIX) 40 MG tablet Take 0.5 tablets (20 mg total) by mouth at bedtime. Patient not taking: Reported on 02/11/2015 07/13/14   Ardis Hughs, MD  hydrALAZINE (APRESOLINE) 25 MG tablet Take 25 mg by mouth 2 (two) times daily.    Historical Provider, MD  HYDROcodone-acetaminophen (NORCO/VICODIN) 5-325 MG per tablet Take 1 tablet by mouth every 6 (six) hours as needed for moderate pain or severe pain. Patient not taking: Reported on 02/11/2015 07/27/14   Ardis Hughs, MD   BP 139/78 mmHg  Pulse 75  Temp(Src) 97.2 F (36.2 C) (Oral)  Resp 18  SpO2 100% Physical Exam  Constitutional: She is oriented to person, place, and time. She appears well-developed and well-nourished. No distress.  HENT:  Head: Normocephalic.  Eyes: Conjunctivae and EOM are  normal. Pupils are equal, round, and reactive to light.  Neck: Neck supple.  Cardiovascular: Normal rate, regular rhythm and normal heart sounds.   Pulmonary/Chest: Effort normal and breath sounds normal. No respiratory distress. She has no wheezes. She has no rales.  Abdominal: Soft. Bowel sounds are normal. She exhibits no distension. There is no tenderness. There is no rebound.  Musculoskeletal: She exhibits no edema.  Neurological: She is alert and oriented to person, place, and time. No cranial nerve deficit. Coordination normal.  5/5 and equal upper and lower extremity strength bilaterally. Equal grip strength bilaterally. Normal finger to nose and heel to shin. No pronator drift.   Skin: Skin is warm and dry.  Psychiatric: She has a normal mood and affect. Her behavior is normal.  Nursing note and vitals reviewed.   ED Course  Procedures (including critical care time) Labs Review Labs Reviewed  CBC WITH DIFFERENTIAL/PLATELET - Abnormal; Notable for the following:    RBC 3.44 (*)    Hemoglobin 11.4 (*)    HCT 34.4 (*)    Neutrophils Relative % 89 (*)    Neutro  Abs 8.6 (*)    Lymphocytes Relative 7 (*)    All other components within normal limits  COMPREHENSIVE METABOLIC PANEL - Abnormal; Notable for the following:    Chloride 95 (*)    BUN 22 (*)    Creatinine, Ser 3.58 (*)    Calcium 8.0 (*)    AST 44 (*)    GFR calc non Af Amer 14 (*)    GFR calc Af Amer 16 (*)    All other components within normal limits  TROPONIN I - Abnormal; Notable for the following:    Troponin I 0.32 (*)    All other components within normal limits  I-STAT TROPOININ, ED - Abnormal; Notable for the following:    Troponin i, poc 0.31 (*)    All other components within normal limits    Imaging Review Ct Head Wo Contrast  02/12/2015   CLINICAL DATA:  Headache, beginning during dialysis.  Hypertension.  EXAM: CT HEAD WITHOUT CONTRAST  TECHNIQUE: Contiguous axial images were obtained from the base  of the skull through the vertex without intravenous contrast.  COMPARISON:  None.  FINDINGS: The ventricles are normal in size and configuration. There is no intracranial mass, hemorrhage, extra-axial fluid collection, or midline shift. Gray-white compartments appear normal. The bony calvarium appears intact. The mastoid air cells are clear.  IMPRESSION: Study within normal limits.   Electronically Signed   By: Lowella Grip III M.D.   On: 02/12/2015 00:05     EKG Interpretation   Date/Time:  Monday February 11 2015 23:39:17 EDT Ventricular Rate:  67 PR Interval:  141 QRS Duration: 93 QT Interval:  438 QTC Calculation: 462 R Axis:   77 Text Interpretation:  Sinus rhythm Consider left atrial enlargement  Consider left ventricular hypertrophy ST elevation suggests acute  pericarditis T waves more peaked than prior and worsening T wave  depression in aVL, but these are similar to prior EKGs. Confirmed by  Mingo Amber  MD, [place] ([id number]) on 02/12/2015 12:14:59 AM      MDM   Final diagnoses:  Elevated troponin  Nonintractable headache, unspecified chronicity pattern, unspecified headache type   Pt with headache and possible syncopal episode at dialysis tonight. Continues to have a headache. Gradual onset. No neurological symptoms. Will get labs, CT head, monitory. ECG and Trop ordered for possible syncopal episode.   12:28 AM Pt feels better, states headache resolved. Trop 0.32. Pt denies CP now or at any point. ECG with no concerning changes. Spoke with nephrology, advised to recheck trop, no other input.    12:51 AM Spoke with [facility], will admit for obs, cycle troponin. Pt continues to be CP free, headache free.   Filed Vitals:   02/11/15 2145 02/11/15 2215 02/12/15 0000 02/12/15 0030  BP: 155/88 149/93 128/84 131/77  Pulse: 75 78 73 81  Temp:      TempSrc:      Resp:   14 16  SpO2: 100% 100% 98% 97%      Jeannett Senior, PA-C 02/12/15 0052  Daleen Bo, MD 02/13/15  1330

## 2015-02-11 NOTE — ED Notes (Signed)
Pt to ED from dialysis c/o headache. Pt is a MWF dialysis patient. Began having a headache during treatment. Pt has occasional headaches during treatment, today described at worsening headache than usual with systolic pressure [id number]. Dialysis staff gave 650mg  acetaminophen prior to arrival. Headache associated with photophobia and nausea. Initial VS 150/90, pulse 70s, CBG 118. Dialysis graft remains accessed from facility. Per EMS, pt had 2363ml removed of fluid, goal removal is 3500, and was taken off treatment 21 mins early.

## 2015-02-11 NOTE — ED Notes (Signed)
Pt c/o frontal headache x 2 hours ago. Headache associated with nausea and photophobia. No other neuro deficits noted

## 2015-02-11 NOTE — ED Notes (Signed)
Pt reports having a syncopal episode during dialysis treatment. Pt a&ox4; denies cp or sob

## 2015-02-12 ENCOUNTER — Encounter (HOSPITAL_COMMUNITY): Payer: Self-pay | Admitting: Internal Medicine

## 2015-02-12 ENCOUNTER — Observation Stay (HOSPITAL_BASED_OUTPATIENT_CLINIC_OR_DEPARTMENT_OTHER): Payer: 59

## 2015-02-12 ENCOUNTER — Other Ambulatory Visit (HOSPITAL_COMMUNITY): Payer: 59

## 2015-02-12 DIAGNOSIS — R51 Headache: Secondary | ICD-10-CM | POA: Diagnosis not present

## 2015-02-12 DIAGNOSIS — E785 Hyperlipidemia, unspecified: Secondary | ICD-10-CM | POA: Diagnosis present

## 2015-02-12 DIAGNOSIS — N186 End stage renal disease: Secondary | ICD-10-CM | POA: Diagnosis not present

## 2015-02-12 DIAGNOSIS — R7989 Other specified abnormal findings of blood chemistry: Secondary | ICD-10-CM | POA: Diagnosis not present

## 2015-02-12 DIAGNOSIS — R778 Other specified abnormalities of plasma proteins: Secondary | ICD-10-CM | POA: Diagnosis present

## 2015-02-12 DIAGNOSIS — R519 Headache, unspecified: Secondary | ICD-10-CM | POA: Insufficient documentation

## 2015-02-12 DIAGNOSIS — Z992 Dependence on renal dialysis: Secondary | ICD-10-CM

## 2015-02-12 DIAGNOSIS — I10 Essential (primary) hypertension: Secondary | ICD-10-CM | POA: Insufficient documentation

## 2015-02-12 DIAGNOSIS — R55 Syncope and collapse: Secondary | ICD-10-CM | POA: Diagnosis not present

## 2015-02-12 DIAGNOSIS — G44039 Episodic paroxysmal hemicrania, not intractable: Secondary | ICD-10-CM

## 2015-02-12 DIAGNOSIS — D649 Anemia, unspecified: Secondary | ICD-10-CM | POA: Diagnosis present

## 2015-02-12 LAB — BASIC METABOLIC PANEL
Anion gap: 12 (ref 5–15)
BUN: 26 mg/dL — AB (ref 6–20)
CO2: 31 mmol/L (ref 22–32)
Calcium: 8.3 mg/dL — ABNORMAL LOW (ref 8.9–10.3)
Chloride: 95 mmol/L — ABNORMAL LOW (ref 101–111)
Creatinine, Ser: 4.38 mg/dL — ABNORMAL HIGH (ref 0.44–1.00)
GFR calc Af Amer: 13 mL/min — ABNORMAL LOW (ref >60)
GFR, EST NON AFRICAN AMERICAN: 11 mL/min — AB (ref >60)
Glucose, Bld: 92 mg/dL (ref 65–99)
Potassium: 3.5 mmol/L (ref 3.5–5.1)
SODIUM: 138 mmol/L (ref 135–145)

## 2015-02-12 LAB — CBC
HCT: 38.1 % (ref 36.0–46.0)
Hemoglobin: 12.2 g/dL (ref 12.0–15.0)
MCH: 32.4 pg (ref 26.0–34.0)
MCHC: 32 g/dL (ref 30.0–36.0)
MCV: 101.1 fL — AB (ref 78.0–100.0)
PLATELETS: 216 10*3/uL (ref 150–400)
RBC: 3.77 MIL/uL — ABNORMAL LOW (ref 3.87–5.11)
RDW: 13.2 % (ref 11.5–15.5)
WBC: 7.1 10*3/uL (ref 4.0–10.5)

## 2015-02-12 LAB — PROTIME-INR
INR: 1.05 (ref 0.00–1.49)
Prothrombin Time: 13.9 seconds (ref 11.6–15.2)

## 2015-02-12 LAB — TROPONIN I
Troponin I: 0.87 ng/mL (ref <0.031)
Troponin I: 0.58 ng/mL (ref <0.031)

## 2015-02-12 LAB — LIPID PANEL
CHOLESTEROL: 214 mg/dL — AB (ref 0–200)
HDL: 83 mg/dL (ref >40)
LDL Cholesterol: 123 mg/dL — ABNORMAL HIGH (ref 0–99)
Total CHOL/HDL Ratio: 2.6 RATIO
Triglycerides: 42 mg/dL (ref <150)
VLDL: 8 mg/dL (ref 0–40)

## 2015-02-12 LAB — CBG MONITORING, ED: GLUCOSE-CAPILLARY: 88 mg/dL (ref 65–99)

## 2015-02-12 MED ORDER — ACETAMINOPHEN 500 MG PO TABS: 500.0000 mg | ORAL_TABLET | Freq: Four times a day (QID) | ORAL | Status: DC | PRN

## 2015-02-12 MED ORDER — LIDOCAINE-PRILOCAINE 2.5-2.5 % EX CREA
1.0000 | TOPICAL_CREAM | CUTANEOUS | Status: DC
Start: 2015-02-13 — End: 2015-02-12

## 2015-02-12 MED ORDER — ATORVASTATIN CALCIUM 20 MG PO TABS
20.0000 mg | ORAL_TABLET | Freq: Every day | ORAL | Status: DC
  Filled 2015-02-12: qty 1

## 2015-02-12 MED ORDER — RENA-VITE PO TABS: 1.0000 | ORAL_TABLET | Freq: Every day | ORAL | Status: DC

## 2015-02-12 MED ORDER — MORPHINE SULFATE 2 MG/ML IJ SOLN: 2.0000 mg | INTRAMUSCULAR | Status: DC | PRN

## 2015-02-12 MED ORDER — HYDRALAZINE HCL 25 MG PO TABS
25.0000 mg | ORAL_TABLET | Freq: Two times a day (BID) | ORAL | Status: DC
  Filled 2015-02-12 (×2): qty 1

## 2015-02-12 MED ORDER — LANTHANUM CARBONATE 500 MG PO CHEW
2000.0000 mg | CHEWABLE_TABLET | Freq: Three times a day (TID) | ORAL | Status: DC
  Administered 2015-02-12: 2000 mg via ORAL
  Filled 2015-02-12 (×4): qty 4

## 2015-02-12 MED ORDER — CALCITRIOL 0.25 MCG PO CAPS: 0.2500 ug | ORAL_CAPSULE | ORAL | Status: DC

## 2015-02-12 MED ORDER — AMLODIPINE BESYLATE 10 MG PO TABS: 10.0000 mg | ORAL_TABLET | Freq: Every day | ORAL | Status: DC

## 2015-02-12 MED ORDER — CINACALCET HCL 30 MG PO TABS
60.0000 mg | ORAL_TABLET | Freq: Every day | ORAL | Status: DC
  Administered 2015-02-12: 60 mg via ORAL
  Filled 2015-02-12 (×2): qty 2

## 2015-02-12 MED ORDER — VITAMIN D 1000 UNITS PO TABS
1000.0000 [IU] | ORAL_TABLET | Freq: Every day | ORAL | Status: DC
  Administered 2015-02-12: 1000 [IU] via ORAL
  Filled 2015-02-12: qty 1

## 2015-02-12 MED ORDER — ONDANSETRON HCL 4 MG/2ML IJ SOLN: 4.0000 mg | Freq: Three times a day (TID) | INTRAMUSCULAR | Status: AC | PRN

## 2015-02-12 MED ORDER — HEPARIN SODIUM (PORCINE) 5000 UNIT/ML IJ SOLN
5000.0000 [IU] | Freq: Three times a day (TID) | INTRAMUSCULAR | Status: DC
  Administered 2015-02-12 (×2): 5000 [IU] via SUBCUTANEOUS
  Filled 2015-02-12 (×2): qty 1

## 2015-02-12 MED ORDER — SODIUM CHLORIDE 0.9 % IJ SOLN
3.0000 mL | Freq: Two times a day (BID) | INTRAMUSCULAR | Status: DC
  Administered 2015-02-12: 3 mL via INTRAVENOUS

## 2015-02-12 MED ORDER — CALCITRIOL 0.5 MCG PO CAPS: 1.0000 ug | ORAL_CAPSULE | ORAL | Status: DC

## 2015-02-12 NOTE — Progress Notes (Signed)
PT Cancellation Note  Patient Details Name: Terry Juarez MRN: [id number] DOB: July 09, 1965   Cancelled Treatment:    Reason Eval/Treat Not Completed: Patient not medically ready.  PT evaluation received, chart reviewed. Pt currently with troponin trending up. Will hold PT eval at this time and check back as schedule allows for medical appropriateness to participate.    [name] 02/12/2015, 7:08 AM   [name], PT, DPT Acute Rehabilitation Services Pager: [phone]

## 2015-02-12 NOTE — H&P (Signed)
[facility] History and Physical  Terry Juarez [id number] DOB: 06/04/1966 DOA: 02/11/2015  Referring physician: ED physician PCP: [name], MD  Specialists:   Chief Complaint: Headache and syncope  HPI: Terry Juarez is a 49 y.o. female with PMH of hypertension, hyperlipidemia, hx of right kidney cancer (s/p of R nephrectomy), ESRD-HD, who presents with a headache and syncope episode.  Patient reports that during dialysis today, she developed severe headache at approximately 6 PM. The headache is located in the frontal area. It was constant and nonradiating. She also had nausea and blurry vision, but no unilateral weakness, numbness or tingling sensations. Her blood pressure was reportedly elevated at SBP 190 mmHg. At 20 minutes to the end of dialysis, she passed out for few second, then recovered spontaneously. She states that she has had headaches during dialysis before but never this severe. She states HD was stopped with 30 minutes to go. Upon EMS arrival blood pressure is 150/90. Her headache has resolved after she received Benadryl and Reglan in emergency room. He does not have chest pain, abdominal pain, chest pain, cough, shortness of breath, diarrhea, symptoms of UTI. Of note, she had 2300 cc fluid removed during HD (the goal was 3500 cc).  In ED, patient was found to have elevated troponin 0.32, WBC 9.8, temperature normal, no tachycardia, creatinine is 3.58. CT head is negative for acute intracranial abnormalities. Patient is admitted to the inpatient for observation.  Where does patient live?   At home    Can patient participate in ADLs?  Yes  Review of Systems:   General: no fevers, chills, no changes in body weight, has poor appetite, has fatigue HEENT: had HA and blurry vision, no hearing changes or sore throat Pulm: no dyspnea, coughing, wheezing CV: no chest pain, palpitations Abd: had nausea, no vomiting, abdominal pain, diarrhea, constipation GU: no  dysuria, burning on urination, increased urinary frequency, hematuria  Ext: no leg edema Neuro: no unilateral weakness, numbness, or tingling, no vision change or hearing loss Skin: no rash MSK: No muscle spasm, no deformity, no limitation of range of movement in spin Heme: No easy bruising.  Travel history: No recent long distant travel.  Allergy: No Known Allergies  Past Medical History  Diagnosis Date  . Hypertension   . Hyperlipidemia   . Bruises easily   . Right renal mass   . Anemia   . Renal disorder     rt renal mass / < functioning of left kidney - being prepared for possible dialysis  . ESRD on dialysis     Past Surgical History  Procedure Laterality Date  . Ectopic pregnancy surgery  1987  . Insertion of dialysis catheter Right 07/11/2014    Procedure: INSERTION OF DIALYSIS CATHETER IN RIGHT INTERNAL JUGULAR ;  Surgeon: Mal Misty, MD;  Location: [place];  Service: Vascular;  Laterality: Right;  . Av fistula placement Right 07/11/2014    Procedure: ARTERIOVENOUS (AV) FISTULA CREATION RIGHT ARM BRACHIO-CEPHALIC WITH ATTEMPTED RADIO-CEPHALIC (AV) FISTULA;  Surgeon: Mal Misty, MD;  Location: [place];  Service: Vascular;  Laterality: Right;  . Thrombectomy w/ embolectomy Right 07/11/2014    Procedure: THROMBECTOMY OF RIGHT RADIAL ARTERY  ;  Surgeon: Mal Misty, MD;  Location: [place];  Service: Vascular;  Laterality: Right;  . Patch angioplasty Right 07/11/2014    Procedure: PATCH ANGIOPLASTY OF RIGHT RADIAL ARTERY USING CEPHALIC VEIN.;  Surgeon: Mal Misty, MD;  Location: [place];  Service: Vascular;  Laterality: Right;  . Laparoscopic nephrectomy Right 07/25/2014    Procedure: RIGHT LAPAROSCOPIC RADICAL NEPHRECTOMY ;  Surgeon: Ardis Hughs, MD;  Location: [facility];  Service: Urology;  Laterality: Right;    Social History:  reports that she has never smoked. She has never used smokeless tobacco. She reports that she drinks alcohol. She reports that she does not use  illicit drugs.  Family History:  Family History  Problem Relation Age of Onset  . Cancer Mother   . Hypertension Father      Prior to Admission medications   Medication Sig Start Date End Date Taking? Authorizing Provider  acetaminophen (TYLENOL) 500 MG tablet Take 500 mg by mouth every 6 (six) hours as needed for moderate pain or headache.   Yes Historical Provider, MD  cholecalciferol (VITAMIN D) 1000 UNITS tablet Take 1,000 Units by mouth daily.    Yes Historical Provider, MD  FOSRENOL 1000 MG chewable tablet Chew 2,000 mg by mouth 3 (three) times daily with meals. 12/14/14  Yes Historical Provider, MD  lidocaine-prilocaine (EMLA) cream Apply 1 application topically every Monday, Wednesday, and Friday with hemodialysis. 02/09/15  Yes Historical Provider, MD  SENSIPAR 60 MG tablet Take 60 mg by mouth daily. 12/17/14  Yes Historical Provider, MD  amLODipine (NORVASC) 10 MG tablet Take 10 mg by mouth at bedtime.     Historical Provider, MD  atorvastatin (LIPITOR) 20 MG tablet Take 20 mg by mouth at bedtime.     Historical Provider, MD  calcitRIOL (ROCALTROL) 0.25 MCG capsule Take 0.25 mcg by mouth every Monday, Wednesday, and Friday at 8 PM.    Historical Provider, MD  docusate sodium 100 MG CAPS Take 100 mg by mouth 2 (two) times daily. Patient not taking: Reported on 02/11/2015 07/27/14   Ardis Hughs, MD  furosemide (LASIX) 40 MG tablet Take 0.5 tablets (20 mg total) by mouth at bedtime. Patient not taking: Reported on 02/11/2015 07/13/14   Ardis Hughs, MD  hydrALAZINE (APRESOLINE) 25 MG tablet Take 25 mg by mouth 2 (two) times daily.    Historical Provider, MD  HYDROcodone-acetaminophen (NORCO/VICODIN) 5-325 MG per tablet Take 1 tablet by mouth every 6 (six) hours as needed for moderate pain or severe pain. Patient not taking: Reported on 02/11/2015 07/27/14   Ardis Hughs, MD    Physical Exam: Filed Vitals:   02/12/15 0230 02/12/15 0300 02/12/15 0400 02/12/15 0602  BP: 120/80  129/79 126/77 151/85  Pulse: 72 69 66 65  Temp:      TempSrc:      Resp: 16 15 16 10   SpO2: 98% 96% 98% 97%   General: Not in acute distress HEENT:       Eyes: PERRL, EOMI, no scleral icterus.       ENT: No discharge from the ears and nose, no pharynx injection, no tonsillar enlargement.        Neck: No JVD, no bruit, no mass felt. Heme: No neck lymph node enlargement. Cardiac: S1/S2, RRR, No murmurs, No gallops or rubs. Pulm:  No rales, wheezing, rhonchi or rubs. Abd: Soft, nondistended, nontender, no rebound pain, no organomegaly, BS present. Ext: No pitting leg edema bilaterally. 2+DP/PT pulse bilaterally. Musculoskeletal: No joint deformities, No joint redness or warmth, no limitation of ROM in spin. Skin: No rashes.  Neuro: Alert, oriented X3, cranial nerves II-XII grossly intact, muscle strength 5/5 in all extremities, sensation to light touch intact. Brachial reflex 2+ bilaterally. Knee reflex 1+ bilaterally. Negative Babinski's sign. Normal  finger to nose test. Psych: Patient is not psychotic, no suicidal or hemocidal ideation.  Labs on Admission:  Basic Metabolic Panel:  Recent Labs Lab 02/11/15 2300 02/12/15 0511  NA 137 138  K 3.8 3.5  CL 95* 95*  CO2 29 31  GLUCOSE 97 92  BUN 22* 26*  CREATININE 3.58* 4.38*  CALCIUM 8.0* 8.3*   Liver Function Tests:  Recent Labs Lab 02/11/15 2300  AST 44*  ALT 20  ALKPHOS 126  BILITOT 0.6  PROT 7.0  ALBUMIN 3.9   No results for input(s): LIPASE, AMYLASE in the last 168 hours. No results for input(s): AMMONIA in the last 168 hours. CBC:  Recent Labs Lab 02/11/15 2300 02/12/15 0511  WBC 9.8 7.1  NEUTROABS 8.6*  --   HGB 11.4* 12.2  HCT 34.4* 38.1  MCV 100.0 101.1*  PLT 210 216   Cardiac Enzymes:  Recent Labs Lab 02/11/15 2300 02/12/15 0511  TROPONINI 0.32* 0.87*    BNP (last 3 results) No results for input(s): BNP in the last 8760 hours.  ProBNP (last 3 results) No results for input(s): PROBNP in  the last 8760 hours.  CBG:  Recent Labs Lab 02/12/15 0603  GLUCAP 88    Radiological Exams on Admission: Ct Head Wo Contrast  02/12/2015   CLINICAL DATA:  Headache, beginning during dialysis.  Hypertension.  EXAM: CT HEAD WITHOUT CONTRAST  TECHNIQUE: Contiguous axial images were obtained from the base of the skull through the vertex without intravenous contrast.  COMPARISON:  None.  FINDINGS: The ventricles are normal in size and configuration. There is no intracranial mass, hemorrhage, extra-axial fluid collection, or midline shift. Gray-white compartments appear normal. The bony calvarium appears intact. The mastoid air cells are clear.  IMPRESSION: Study within normal limits.   Electronically Signed   By: Lowella Grip III M.D.   On: 02/12/2015 00:05    EKG: Independently reviewed.  Abnormal findings:  QTc 462, LAE, no ischemic change..   Assessment/Plan Principal Problem:   Headache Active Problems:   Elevated troponin   Hypertension   Hyperlipidemia   Anemia   Syncope   ESRD on dialysis  Headache: Etiology is not clear. CT head is negative for acute intracranial abnormalities. Her headache has resolved after received Benadryl and Reglan. Currently she does not have headache. No signs of stroke or TIA. No focal neurological findings on physical examination. -will admit to observation -When necessary Tylenol -Frequent neuro check  Syncope: Etiology is not clear. Likely due to orthostatic status secondary to fluid removal on dialysis. -Check orthostatic vital signs -PT/OT -Follow-up 2-D echo which is also for elevated troponin  Elevated troponin: trop 0.32, no chest pain. No ischemic changes on EKG. Most likely due to ESRD -trop x 3 -2 d echo -repeat EKG in AM  Hypertension -Amlodipine, hydralazine  ESRD-HD (MWF): -left message to renal for HD  -continue Sensipar. Fosrenol, calcitriol and VD  HLD: Last LDL was not on record -Continue home medications:  Lipitor -Check FLP  Anemia: Secondary to ESRD. Hgb stable -follow up by CBC   DVT ppx: SQ Heparin  Code Status: Full code Family Communication: None at bed side. Disposition Plan: Admit to inpatient   Date of Service 02/12/2015    Ivor Costa [facility] Pager [phone]  If 7PM-7AM, please contact night-coverage [email or web address] Password [email or web address] 02/12/2015, 6:40 AM

## 2015-02-12 NOTE — Progress Notes (Signed)
*  PRELIMINARY RESULTS* Echocardiogram 2D Echocardiogram has been performed.  Leavy Cella 02/12/2015, 1:10 PM

## 2015-02-12 NOTE — ED Notes (Signed)
Critical Troponin of 0.87 per Nolberto Hanlon.

## 2015-02-12 NOTE — Discharge Summary (Signed)
Physician Discharge Summary  Terry Juarez [id number] DOB: [birth year] DOA: 02/11/2015  PCP: [name], MD  Admit date: 02/11/2015 Discharge date: 02/12/2015  Recommendations for Outpatient Follow-up:  1. F/u for hemodialysis on Wednesday at already scheduled appointment 2. PCP in 1 week to discuss headache and consider referral to cardiology.    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 She presented to the emergency department after she had an episode of severe headache, nausea, blurry vision and severe high blood pressure that occurred while she was at hemodialysis. In the emergency department, she was found have a troponin of 0.32.  She was afebrile without leukocytosis. Her head CT was negative for abnormalities. She was admitted for observation.  Hospital Course:   Headache, may be secondary to becoming somewhat dehydrated towards the end of her hemodialysis since they removed 2.4 L. She sometimes gets headaches that are less severe at hemodialysis anyway.  CT head was negative for hemorrhage or other acute change to explain her symptoms. She had no signs of stroke or TIA or focal neurologic deficits and her headache resolved spontaneously.  Syncope, likely secondary to orthostatic hypotension or vasovagal syncope secondary to pain and fluid removal from hemodialysis. She received some IV fluids. She was able to  ambulate without difficulty and felt back to her baseline at the time of discharge.  Elevated troponin, 0.32 without chest pain. ECG demonstrated diffuse ST segment elevations more consistent with pericarditis.  Her troponin trended up to 0.87 and then back down to 0.58, however I feel that this was likely secondary to end-stage renal disease.  She remained entirely chest pain free. Her telemetry demonstrated normal sinus rhythm. Her echocardiogram demonstrated normal cavity size, moderate concentric hypertrophy, preserved ejection fraction of 60-65% without regional wall motion abnormalities. She had grade 1 diastolic dysfunction and mild mitral valve regurgitation. She was advised to follow-up with cardiology for an outpatient evaluation and consideration of stress test. She is at risk for cardiovascular disease despite her young age due to her end-stage renal disease.  Hypertension, blood pressure remained within normal limits to minimally elevated. She continued her home blood pressure medications.  End-stage renal disease on hemodialysis Monday, Wednesday, Friday. Her medications such as Sensipar, Fosrenol, calcitriol were continued. She did not require hemodialysis during her hospitalization.  Hyperlipidemia, stable, continued Lipitor.  Anemia secondary to chronic renal disease, hemoglobin remained stable. This is managed by her nephrologist as an outpatient.  History of renal cell carcinoma status post right nephrectomy, in remission.  Procedures:  CT head  Echocardiogram  Consultations:  Nephrology  Discharge Exam: Filed Vitals:   02/12/15 1354  BP: 144/85  Pulse: 66  Temp: 99.6 F (37.6 C)  Resp: 16   Filed Vitals:   02/12/15 1118 02/12/15 1224 02/12/15 1321 02/12/15 1354  BP: 130/70 141/91 142/78 144/85  Pulse: 73 76 76 66  Temp:    99.6 F (37.6 C)  TempSrc:  Oral  Resp: 13 14 10 16   Height:    5\' 5"  (1.651 m)  Weight:    53.071 kg (117 lb)  SpO2: 93% 96% 96%  95%    General: Adult female, no acute distress Cardiovascular: Regular rate and rhythm, no obvious murmurs but difficult to discern since she has a loud bruit from her fistula Respiratory: Clear to auscultation bilaterally Abdomen: NABS, soft, nondistended, nontender MSK: No lower extremity edema, normal tone and bulk Skin: Throat present, loud bruit in right upper arm fistula  Discharge Instructions     Medication List    ASK your doctor about these medications        acetaminophen 500 MG tablet  Commonly known as:  TYLENOL  Take 500 mg by mouth every 6 (six) hours as needed for moderate pain or headache.     amLODipine 10 MG tablet  Commonly known as:  NORVASC  Take 10 mg by mouth at bedtime.     atorvastatin 20 MG tablet  Commonly known as:  LIPITOR  Take 20 mg by mouth at bedtime.     calcitRIOL 0.25 MCG capsule  Commonly known as:  ROCALTROL  Take 0.25 mcg by mouth every Monday, Wednesday, and Friday at 8 PM.     cholecalciferol 1000 UNITS tablet  Commonly known as:  VITAMIN D  Take 1,000 Units by mouth daily.     DSS 100 MG Caps  Take 100 mg by mouth 2 (two) times daily.     FOSRENOL 1000 MG chewable tablet  Generic drug:  lanthanum  Chew 2,000 mg by mouth 3 (three) times daily with meals.     furosemide 40 MG tablet  Commonly known as:  LASIX  Take 0.5 tablets (20 mg total) by mouth at bedtime.     hydrALAZINE 25 MG tablet  Commonly known as:  APRESOLINE  Take 25 mg by mouth 2 (two) times daily.     HYDROcodone-acetaminophen 5-325 MG per tablet  Commonly known as:  NORCO/VICODIN  Take 1 tablet by mouth every 6 (six) hours as needed for moderate pain or severe pain.     lidocaine-prilocaine cream  Commonly known as:  EMLA  Apply 1 application topically every Monday, Wednesday, and Friday with hemodialysis.     SENSIPAR 60 MG tablet  Generic drug:  cinacalcet  Take 60 mg by mouth daily.          The results of significant diagnostics from  this hospitalization (including imaging, microbiology, ancillary and laboratory) are listed below for reference.    Significant Diagnostic Studies: Dg Chest 2 View  01/24/2015   CLINICAL DATA:  Kidney ca with nephrectomy jan. 2016, nonsmoker, no chest complaints  EXAM: CHEST  2 VIEW  COMPARISON:  07/11/2014  FINDINGS: The heart size and mediastinal contours are within normal limits. Both lungs are clear. No lung nodules. No pleural effusion or pneumothorax. The visualized skeletal structures are unremarkable.  IMPRESSION: No active cardiopulmonary disease.   Electronically Signed   By: Lajean Manes M.D.   On: 01/24/2015 12:32   Ct Head Wo Contrast  02/12/2015   CLINICAL DATA:  Headache, beginning during dialysis.  Hypertension.  EXAM: CT HEAD WITHOUT CONTRAST  TECHNIQUE: Contiguous axial images were obtained from the base of the skull through the vertex without intravenous contrast.  COMPARISON:  None.  FINDINGS: The ventricles are normal in size and configuration. There is no intracranial mass, hemorrhage, extra-axial fluid collection, or midline shift. Gray-white compartments appear normal. The  bony calvarium appears intact. The mastoid air cells are clear.  IMPRESSION: Study within normal limits.   Electronically Signed   By: Lowella Grip III M.D.   On: 02/12/2015 00:05    Microbiology: No results found for this or any previous visit (from the past 240 hour(s)).   Labs: Basic Metabolic Panel:  Recent Labs Lab 02/11/15 2300 02/12/15 0511  NA 137 138  K 3.8 3.5  CL 95* 95*  CO2 29 31  GLUCOSE 97 92  BUN 22* 26*  CREATININE 3.58* 4.38*  CALCIUM 8.0* 8.3*   Liver Function Tests:  Recent Labs Lab 02/11/15 2300  AST 44*  ALT 20  ALKPHOS 126  BILITOT 0.6  PROT 7.0  ALBUMIN 3.9   No results for input(s): LIPASE, AMYLASE in the last 168 hours. No results for input(s): AMMONIA in the last 168 hours. CBC:  Recent Labs Lab 02/11/15 2300 02/12/15 0511  WBC 9.8 7.1   NEUTROABS 8.6*  --   HGB 11.4* 12.2  HCT 34.4* 38.1  MCV 100.0 101.1*  PLT 210 216   Cardiac Enzymes:  Recent Labs Lab 02/11/15 2300 02/12/15 0511 02/12/15 1118  TROPONINI 0.32* 0.87* 0.58*   BNP: BNP (last 3 results) No results for input(s): BNP in the last 8760 hours.  ProBNP (last 3 results) No results for input(s): PROBNP in the last 8760 hours.  CBG:  Recent Labs Lab 02/12/15 0603  GLUCAP 88    Time coordinating discharge: 35 minutes  Signed:  Deirdre Gryder  [facility] 02/12/2015, 3:24 PM

## 2015-02-12 NOTE — Consult Note (Signed)
Indication for Consultation:  Management of ESRD/hemodialysis; anemia, hypertension/volume and secondary hyperparathyroidism  HPI: Terry Juarez is a 49 y.o. female who presented to the ED last night from her outpt HD center with headache, hypertension and an episode of being unresponsive. She receives HD MWF 3rd shift [facility], history of HTN, hyperlipidemia and R renal mass. She was at HD last night and began to experience headache, then per outpt notes became non responsive with BP 189/167, she was placed on 5L o2 and 911 called, BP remained elevated 170/101. Headache is now resolved, she will be admitted for observation. Will arrange for HD tomorrow if still admitted.    Past Medical History  Diagnosis Date  . Hypertension   . Hyperlipidemia   . Bruises easily   . Right renal mass   . Anemia   . Renal disorder     rt renal mass / < functioning of left kidney - being prepared for possible dialysis  . ESRD on dialysis    Past Surgical History  Procedure Laterality Date  . Ectopic pregnancy surgery  1987  . Insertion of dialysis catheter Right 07/11/2014    Procedure: INSERTION OF DIALYSIS CATHETER IN RIGHT INTERNAL JUGULAR ;  Surgeon: Mal Misty, MD;  Location: [place];  Service: Vascular;  Laterality: Right;  . Av fistula placement Right 07/11/2014    Procedure: ARTERIOVENOUS (AV) FISTULA CREATION RIGHT ARM BRACHIO-CEPHALIC WITH ATTEMPTED RADIO-CEPHALIC (AV) FISTULA;  Surgeon: Mal Misty, MD;  Location: [place];  Service: Vascular;  Laterality: Right;  . Thrombectomy w/ embolectomy Right 07/11/2014    Procedure: THROMBECTOMY OF RIGHT RADIAL ARTERY  ;  Surgeon: Mal Misty, MD;  Location: [place];  Service: Vascular;  Laterality: Right;  . Patch angioplasty Right 07/11/2014    Procedure: PATCH ANGIOPLASTY OF RIGHT RADIAL ARTERY USING CEPHALIC VEIN.;  Surgeon: Mal Misty, MD;  Location: [place];  Service: Vascular;  Laterality: Right;  . Laparoscopic nephrectomy Right 07/25/2014     Procedure: RIGHT LAPAROSCOPIC RADICAL NEPHRECTOMY ;  Surgeon: Ardis Hughs, MD;  Location: [facility];  Service: Urology;  Laterality: Right;   Family History  Problem Relation Age of Onset  . Cancer Mother   . Hypertension Father    Social History:  reports that she has never smoked. She has never used smokeless tobacco. She reports that she drinks alcohol. She reports that she does not use illicit drugs. No Known Allergies Prior to Admission medications   Medication Sig Start Date End Date Taking? Authorizing Provider  acetaminophen (TYLENOL) 500 MG tablet Take 500 mg by mouth every 6 (six) hours as needed for moderate pain or headache.   Yes Historical Provider, MD  cholecalciferol (VITAMIN D) 1000 UNITS tablet Take 1,000 Units by mouth daily.    Yes Historical Provider, MD  FOSRENOL 1000 MG chewable tablet Chew 2,000 mg by mouth 3 (three) times daily with meals. 12/14/14  Yes Historical Provider, MD  lidocaine-prilocaine (EMLA) cream Apply 1 application topically every Monday, Wednesday, and Friday with hemodialysis. 02/09/15  Yes Historical Provider, MD  SENSIPAR 60 MG tablet Take 60 mg by mouth daily. 12/17/14  Yes Historical Provider, MD  amLODipine (NORVASC) 10 MG tablet Take 10 mg by mouth at bedtime.     Historical Provider, MD  atorvastatin (LIPITOR) 20 MG tablet Take 20 mg by mouth at bedtime.     Historical Provider, MD  calcitRIOL (ROCALTROL) 0.25 MCG capsule Take 0.25 mcg by mouth every Monday, Wednesday, and Friday  at 8 PM.    Historical Provider, MD  docusate sodium 100 MG CAPS Take 100 mg by mouth 2 (two) times daily. Patient not taking: Reported on 02/11/2015 07/27/14   Ardis Hughs, MD  furosemide (LASIX) 40 MG tablet Take 0.5 tablets (20 mg total) by mouth at bedtime. Patient not taking: Reported on 02/11/2015 07/13/14   Ardis Hughs, MD  hydrALAZINE (APRESOLINE) 25 MG tablet Take 25 mg by mouth 2 (two) times daily.    Historical Provider, MD   HYDROcodone-acetaminophen (NORCO/VICODIN) 5-325 MG per tablet Take 1 tablet by mouth every 6 (six) hours as needed for moderate pain or severe pain. Patient not taking: Reported on 02/11/2015 07/27/14   Ardis Hughs, MD   Current Facility-Administered Medications  Medication Dose Route Frequency Provider Last Rate Last Dose  . acetaminophen (TYLENOL) tablet 500 mg  500 mg Oral Q6H PRN Ivor Costa, MD      . amLODipine (NORVASC) tablet 10 mg  10 mg Oral QHS Ivor Costa, MD      . atorvastatin (LIPITOR) tablet 20 mg  20 mg Oral QHS Ivor Costa, MD      . Derrill Memo ON 02/13/2015] calcitRIOL (ROCALTROL) capsule 0.25 mcg  0.25 mcg Oral Q M,W,F-2000 Ivor Costa, MD      . cholecalciferol (VITAMIN D) tablet 1,000 Units  1,000 Units Oral Daily Ivor Costa, MD      . cinacalcet (SENSIPAR) tablet 60 mg  60 mg Oral Q breakfast Ivor Costa, MD   60 mg at 02/12/15 0840  . heparin injection 5,000 Units  5,000 Units Subcutaneous 3 times per day Ivor Costa, MD   5,000 Units at 02/12/15 0815  . hydrALAZINE (APRESOLINE) tablet 25 mg  25 mg Oral BID Ivor Costa, MD   Stopped at 02/12/15 1009  . lanthanum (FOSRENOL) chewable tablet 2,000 mg  2,000 mg Oral TID WC Ivor Costa, MD   2,000 mg at 02/12/15 0840  . morphine 2 MG/ML injection 2 mg  2 mg Intravenous Q4H PRN Ivor Costa, MD      . ondansetron [facility]) injection 4 mg  4 mg Intravenous Q8H PRN Tatyana Kirichenko, PA-C      . sodium chloride 0.9 % injection 3 mL  3 mL Intravenous Q12H Ivor Costa, MD       Current Outpatient Prescriptions  Medication Sig Dispense Refill  . acetaminophen (TYLENOL) 500 MG tablet Take 500 mg by mouth every 6 (six) hours as needed for moderate pain or headache.    . cholecalciferol (VITAMIN D) 1000 UNITS tablet Take 1,000 Units by mouth daily.     Marland Kitchen FOSRENOL 1000 MG chewable tablet Chew 2,000 mg by mouth 3 (three) times daily with meals.  0  . lidocaine-prilocaine (EMLA) cream Apply 1 application topically every Monday, Wednesday, and Friday with  hemodialysis.    . SENSIPAR 60 MG tablet Take 60 mg by mouth daily.    Marland Kitchen amLODipine (NORVASC) 10 MG tablet Take 10 mg by mouth at bedtime.     Marland Kitchen atorvastatin (LIPITOR) 20 MG tablet Take 20 mg by mouth at bedtime.     . calcitRIOL (ROCALTROL) 0.25 MCG capsule Take 0.25 mcg by mouth every Monday, Wednesday, and Friday at 8 PM.    . docusate sodium 100 MG CAPS Take 100 mg by mouth 2 (two) times daily. (Patient not taking: Reported on 02/11/2015) 60 capsule 0  . furosemide (LASIX) 40 MG tablet Take 0.5 tablets (20 mg total) by mouth at bedtime. (Patient not  taking: Reported on 02/11/2015) 60 tablet 0  . hydrALAZINE (APRESOLINE) 25 MG tablet Take 25 mg by mouth 2 (two) times daily.    Marland Kitchen HYDROcodone-acetaminophen (NORCO/VICODIN) 5-325 MG per tablet Take 1 tablet by mouth every 6 (six) hours as needed for moderate pain or severe pain. (Patient not taking: Reported on 02/11/2015) 30 tablet 0   Labs: Basic Metabolic Panel:  Recent Labs Lab 02/11/15 2300 02/12/15 0511  NA 137 138  K 3.8 3.5  CL 95* 95*  CO2 29 31  GLUCOSE 97 92  BUN 22* 26*  CREATININE 3.58* 4.38*  CALCIUM 8.0* 8.3*   Liver Function Tests:  Recent Labs Lab 02/11/15 2300  AST 44*  ALT 20  ALKPHOS 126  BILITOT 0.6  PROT 7.0  ALBUMIN 3.9   CBC:  Recent Labs Lab 02/11/15 2300 02/12/15 0511  WBC 9.8 7.1  NEUTROABS 8.6*  --   HGB 11.4* 12.2  HCT 34.4* 38.1  MCV 100.0 101.1*  PLT 210 216   Cardiac Enzymes:  Recent Labs Lab 02/11/15 2300 02/12/15 0511  TROPONINI 0.32* 0.87*   CBG:  Recent Labs Lab 02/12/15 0603  GLUCAP 88   Studies/Results: Ct Head Wo Contrast  02/12/2015   CLINICAL DATA:  Headache, beginning during dialysis.  Hypertension.  EXAM: CT HEAD WITHOUT CONTRAST  TECHNIQUE: Contiguous axial images were obtained from the base of the skull through the vertex without intravenous contrast.  COMPARISON:  None.  FINDINGS: The ventricles are normal in size and configuration. There is no intracranial  mass, hemorrhage, extra-axial fluid collection, or midline shift. Gray-white compartments appear normal. The bony calvarium appears intact. The mastoid air cells are clear.  IMPRESSION: Study within normal limits.   Electronically Signed   By: Lowella Grip III M.D.   On: 02/12/2015 00:05   Review of Systems: Reports feeling well prior to HD yesterday. Reports began experiencing severe headache about 2 hours into HD and becoming hypertensive. Denies any associated symptoms.  Reports history of migraines years ago- managed with tylenol.   Physical Exam: Filed Vitals:   02/12/15 0900 02/12/15 0930 02/12/15 1000 02/12/15 1009  BP: 130/67 132/69 136/80 136/80  Pulse: 75 78 70   Temp:      TempSrc:      Resp: 14 13 14    SpO2: 99% 100% 99%      General: Well developed, well nourished, in no acute distress. Resting comfortably Head: Normocephalic, atraumatic, sclera non-icteric, mucus membranes are moist Neck: Supple. JVD not elevated. Lungs: Clear bilaterally to auscultation without wheezes, rales, or rhonchi. Breathing is unlabored. Heart: RRR with S1 S2. No murmurs, rubs, or gallops appreciated. Abdomen: Soft, non-tender, non-distended with normoactive bowel sounds. No rebound/guarding. No obvious abdominal masses. M-S:  Strength and tone appear normal for age. Lower extremities:without edema or ischemic changes, no open wounds  Neuro: Alert and oriented X 3. Moves all extremities spontaneously. Psych:  Responds to questions appropriately with a normal affect. Dialysis Access: R AVF +b/t  Dialysis Orders: MWF [facility] 4 hours    400/800    50.5kgs   2k/2ca    Profile 4   3000u heparin Micera 50 q 2 weeks- to start 8/10 Calcitriol 1 q HD  Assessment/Plan: 1.  headache- head CT negative. Now resolved. No neuro deficits- cont neuro checks 2. Elevated troponin- Echo pending. Denies chest pain 3.  ESRD -  MWF @ [place], HD last night, next HD tomorrow. K+ 3.5 4.  Hypertension/volume  -   controlled now- norvasc  and hydralazine here- no home BP meds. 189/167 and 170/101 last night at HD, gets to edw outpt. 5. Anemia  - hgb 12.2 - micera to start tomorrow- hold for now and watch CBC. No Fe - last tsat 39 6.  Metabolic bone disease -  last phos 7.2 and PTH 1069 - cont Fosrenol, calcitriol and sensipar 7.  Nutrition - renal diet. vitamin  Shelle Iron, NP [employer] [phone] 02/12/2015, 10:21 AM   Pt seen, examined and agree w A/P as above.  [name] pager [phone]    cell [phone] 02/12/2015, 4:26 PM

## 2015-04-08 DIAGNOSIS — R82998 Other abnormal findings in urine: Secondary | ICD-10-CM | POA: Insufficient documentation

## 2015-04-08 DIAGNOSIS — E872 Acidosis, unspecified: Secondary | ICD-10-CM | POA: Insufficient documentation

## 2015-04-08 DIAGNOSIS — Z79899 Other long term (current) drug therapy: Secondary | ICD-10-CM | POA: Insufficient documentation

## 2015-04-08 DIAGNOSIS — N2589 Other disorders resulting from impaired renal tubular function: Secondary | ICD-10-CM | POA: Insufficient documentation

## 2015-07-07 ENCOUNTER — Encounter (HOSPITAL_COMMUNITY): Payer: Self-pay | Admitting: *Deleted

## 2015-07-07 ENCOUNTER — Emergency Department (HOSPITAL_COMMUNITY)
Admission: EM | Admit: 2015-07-07 | Discharge: 2015-07-07 | Discharge status: Against Medical Advice | Payer: Managed Care, Other (non HMO) | Attending: Emergency Medicine | Admitting: Emergency Medicine

## 2015-07-07 DIAGNOSIS — Z95828 Presence of other vascular implants and grafts: Secondary | ICD-10-CM | POA: Diagnosis not present

## 2015-07-07 DIAGNOSIS — Z992 Dependence on renal dialysis: Secondary | ICD-10-CM | POA: Diagnosis not present

## 2015-07-07 DIAGNOSIS — Z79899 Other long term (current) drug therapy: Secondary | ICD-10-CM | POA: Insufficient documentation

## 2015-07-07 DIAGNOSIS — Z862 Personal history of diseases of the blood and blood-forming organs and certain disorders involving the immune mechanism: Secondary | ICD-10-CM | POA: Insufficient documentation

## 2015-07-07 DIAGNOSIS — Z87828 Personal history of other (healed) physical injury and trauma: Secondary | ICD-10-CM | POA: Insufficient documentation

## 2015-07-07 DIAGNOSIS — Z8639 Personal history of other endocrine, nutritional and metabolic disease: Secondary | ICD-10-CM | POA: Diagnosis not present

## 2015-07-07 DIAGNOSIS — R112 Nausea with vomiting, unspecified: Secondary | ICD-10-CM | POA: Diagnosis present

## 2015-07-07 DIAGNOSIS — N186 End stage renal disease: Secondary | ICD-10-CM | POA: Diagnosis not present

## 2015-07-07 DIAGNOSIS — I120 Hypertensive chronic kidney disease with stage 5 chronic kidney disease or end stage renal disease: Secondary | ICD-10-CM | POA: Diagnosis not present

## 2015-07-07 DIAGNOSIS — R1032 Left lower quadrant pain: Secondary | ICD-10-CM | POA: Insufficient documentation

## 2015-07-07 LAB — COMPREHENSIVE METABOLIC PANEL
ALT: 9 U/L — AB (ref 14–54)
AST: 17 U/L (ref 15–41)
Albumin: 3.4 g/dL — ABNORMAL LOW (ref 3.5–5.0)
Alkaline Phosphatase: 43 U/L (ref 38–126)
Anion gap: 16 — ABNORMAL HIGH (ref 5–15)
BILIRUBIN TOTAL: 0.4 mg/dL (ref 0.3–1.2)
BUN: 85 mg/dL — AB (ref 6–20)
CHLORIDE: 102 mmol/L (ref 101–111)
CO2: 21 mmol/L — ABNORMAL LOW (ref 22–32)
CREATININE: 8.54 mg/dL — AB (ref 0.44–1.00)
Calcium: 7.8 mg/dL — ABNORMAL LOW (ref 8.9–10.3)
GFR calc Af Amer: 6 mL/min — ABNORMAL LOW (ref >60)
GFR calc non Af Amer: 5 mL/min — ABNORMAL LOW (ref >60)
Glucose, Bld: 98 mg/dL (ref 65–99)
Potassium: 4.9 mmol/L (ref 3.5–5.1)
Sodium: 139 mmol/L (ref 135–145)
Total Protein: 6.2 g/dL — ABNORMAL LOW (ref 6.5–8.1)

## 2015-07-07 LAB — CBC
HCT: 36.1 % (ref 36.0–46.0)
Hemoglobin: 11.4 g/dL — ABNORMAL LOW (ref 12.0–15.0)
MCH: 31.8 pg (ref 26.0–34.0)
MCHC: 31.6 g/dL (ref 30.0–36.0)
MCV: 100.8 fL — AB (ref 78.0–100.0)
Platelets: 289 10*3/uL (ref 150–400)
RBC: 3.58 MIL/uL — ABNORMAL LOW (ref 3.87–5.11)
RDW: 13.1 % (ref 11.5–15.5)
WBC: 10.9 10*3/uL — ABNORMAL HIGH (ref 4.0–10.5)

## 2015-07-07 LAB — URINALYSIS, ROUTINE W REFLEX MICROSCOPIC
BILIRUBIN URINE: NEGATIVE
GLUCOSE, UA: NEGATIVE mg/dL
HGB URINE DIPSTICK: NEGATIVE
KETONES UR: NEGATIVE mg/dL
Leukocytes, UA: NEGATIVE
Nitrite: NEGATIVE
PH: 5 (ref 5.0–8.0)
Protein, ur: 30 mg/dL — AB
Specific Gravity, Urine: 1.018 (ref 1.005–1.030)

## 2015-07-07 LAB — URINE MICROSCOPIC-ADD ON
BACTERIA UA: NONE SEEN
RBC / HPF: NONE SEEN RBC/hpf (ref 0–5)

## 2015-07-07 LAB — LIPASE, BLOOD: Lipase: 73 U/L — ABNORMAL HIGH (ref 11–51)

## 2015-07-07 MED ORDER — OXYCODONE-ACETAMINOPHEN 5-325 MG PO TABS
1.0000 | ORAL_TABLET | Freq: Once | ORAL | Status: AC
  Administered 2015-07-07: 1 via ORAL

## 2015-07-07 MED ORDER — OXYCODONE-ACETAMINOPHEN 5-325 MG PO TABS
ORAL_TABLET | ORAL | Status: AC
  Filled 2015-07-07: qty 1

## 2015-07-07 NOTE — ED Notes (Signed)
Per Pharm tech, patient left AMA stating that she had not been seen or updated in over 2 hrs.

## 2015-07-07 NOTE — Progress Notes (Signed)
Spoke with nurse from ED, [place]; informed her that once the appropriate orders are placed, a nurse will come draw ordered specimens.

## 2015-07-07 NOTE — ED Provider Notes (Signed)
CSN: [id number]     Arrival date & time 07/07/15  1429 History   First MD Initiated Contact with Patient 07/07/15 1746     Chief Complaint  Patient presents with  . Abdominal Pain  . Emesis   50 year old [place] American female with PMH of HTN, HLD, ESRD on PD (APD who presents today with 4 days of abdominal pain, worse the past 2 days. Located in the left lower quadrant, achy in sensation. Does not radiate. Tried Percocet with no help. Has also had nausea and vomiting today, nonbilious, nonbloody. Denies any hematuria dysuria or fevers chills chest pain back pain diarrhea constipation sick contacts or recent travel.)   (Consider location/radiation/quality/duration/timing/severity/associated sxs/prior Treatment) Patient is a 50 y.o. female presenting with abdominal pain.  Abdominal Pain Pain location:  LLQ Pain quality: aching   Pain radiates to:  Does not radiate Pain severity:  Moderate Onset quality:  Gradual Duration:  4 days Chronicity:  New Relieved by:  Nothing Worsened by:  Nothing tried Associated symptoms: nausea and vomiting   Associated symptoms: no chest pain, no chills, no constipation, no diarrhea, no dysuria, no fever and no shortness of breath     Past Medical History  Diagnosis Date  . Hypertension   . Hyperlipidemia   . Bruises easily   . Right renal mass   . Anemia   . Renal disorder     rt renal mass / < functioning of left kidney - being prepared for possible dialysis  . ESRD on dialysis [facility])    Past Surgical History  Procedure Laterality Date  . Ectopic pregnancy surgery  1987  . Insertion of dialysis catheter Right 07/11/2014    Procedure: INSERTION OF DIALYSIS CATHETER IN RIGHT INTERNAL JUGULAR ;  Surgeon: Mal Misty, MD;  Location: [place];  Service: Vascular;  Laterality: Right;  . Av fistula placement Right 07/11/2014    Procedure: ARTERIOVENOUS (AV) FISTULA CREATION RIGHT ARM BRACHIO-CEPHALIC WITH ATTEMPTED RADIO-CEPHALIC (AV) FISTULA;  Surgeon:  Mal Misty, MD;  Location: [place];  Service: Vascular;  Laterality: Right;  . Thrombectomy w/ embolectomy Right 07/11/2014    Procedure: THROMBECTOMY OF RIGHT RADIAL ARTERY  ;  Surgeon: Mal Misty, MD;  Location: [place];  Service: Vascular;  Laterality: Right;  . Patch angioplasty Right 07/11/2014    Procedure: PATCH ANGIOPLASTY OF RIGHT RADIAL ARTERY USING CEPHALIC VEIN.;  Surgeon: Mal Misty, MD;  Location: [place];  Service: Vascular;  Laterality: Right;  . Laparoscopic nephrectomy Right 07/25/2014    Procedure: RIGHT LAPAROSCOPIC RADICAL NEPHRECTOMY ;  Surgeon: Ardis Hughs, MD;  Location: [facility];  Service: Urology;  Laterality: Right;   Family History  Problem Relation Age of Onset  . Cancer Mother   . Hypertension Father    Social History  Substance Use Topics  . Smoking status: Never Smoker   . Smokeless tobacco: Never Used  . Alcohol Use: 0.0 oz/week    0 Standard drinks or equivalent per week     Comment: occ   OB History    No data available     Review of Systems  Constitutional: Negative for fever and chills.  Respiratory: Negative for shortness of breath.   Cardiovascular: Negative for chest pain, palpitations and leg swelling.  Gastrointestinal: Positive for nausea, vomiting and abdominal pain (LLQ). Negative for diarrhea, constipation and abdominal distention.  Genitourinary: Negative for dysuria, frequency, flank pain and decreased urine volume.  Neurological: Negative for dizziness, speech difficulty, light-headedness and headaches.  All other systems reviewed and are negative.     Allergies  Review of patient's allergies indicates no known allergies.  Home Medications   Prior to Admission medications   Medication Sig Start Date End Date Taking? Authorizing Provider  acetaminophen (TYLENOL) 500 MG tablet Take 500 mg by mouth every 6 (six) hours as needed for moderate pain or headache.   Yes Historical Provider, MD  amLODipine (NORVASC) 10 MG  tablet Take 10 mg by mouth at bedtime.    Yes Historical Provider, MD  calcitRIOL (ROCALTROL) 0.25 MCG capsule Take 0.5 mcg by mouth daily.    Yes Historical Provider, MD  calcium acetate (PHOSLO) 667 MG capsule Take 667-2,001 mg by mouth 3 (three) times daily with meals. Take 2001 mg three times a day with meals  Take 667 mg with snacks twice daily   Yes Historical Provider, MD  lidocaine-prilocaine (EMLA) cream Apply 1 application topically every Monday, Wednesday, and Friday with hemodialysis. 02/09/15  Yes Historical Provider, MD  lisinopril (PRINIVIL,ZESTRIL) 20 MG tablet Take 20 mg by mouth daily.   Yes Historical Provider, MD  multivitamin (RENA-VIT) TABS tablet Take 1 tablet by mouth daily.   Yes Historical Provider, MD  SENSIPAR 60 MG tablet Take 60 mg by mouth daily. 12/17/14  Yes Historical Provider, MD  sodium bicarbonate 650 MG tablet Take 650 mg by mouth 2 (two) times daily.   Yes Historical Provider, MD   BP 112/68 mmHg  Pulse 73  Temp(Src) 98.2 F (36.8 C) (Oral)  Resp 20  SpO2 98% Physical Exam  Constitutional: She is oriented to person, place, and time. She appears well-developed and well-nourished. No distress.  HENT:  Head: Normocephalic and atraumatic.  Cardiovascular: Normal rate, regular rhythm, normal heart sounds and intact distal pulses.  Exam reveals no gallop and no friction rub.   No murmur heard. Pulmonary/Chest: Effort normal and breath sounds normal. No respiratory distress. She has no wheezes. She has no rales. She exhibits no tenderness.  Abdominal: Soft. Bowel sounds are normal. She exhibits no distension and no mass. There is tenderness (LLQ). There is no rebound and no guarding.    Musculoskeletal: She exhibits no edema or tenderness.  Lymphadenopathy:    She has no cervical adenopathy.  Neurological: She is alert and oriented to person, place, and time. No cranial nerve deficit. Coordination normal.  Skin: Skin is warm and dry. She is not diaphoretic.    Nursing note and vitals reviewed.   ED Course  Procedures (including critical care time) Labs Review Labs Reviewed  LIPASE, BLOOD - Abnormal; Notable for the following:    Lipase 73 (*)    All other components within normal limits  COMPREHENSIVE METABOLIC PANEL - Abnormal; Notable for the following:    CO2 21 (*)    BUN 85 (*)    Creatinine, Ser 8.54 (*)    Calcium 7.8 (*)    Total Protein 6.2 (*)    Albumin 3.4 (*)    ALT 9 (*)    GFR calc non Af Amer 5 (*)    GFR calc Af Amer 6 (*)    Anion gap 16 (*)    All other components within normal limits  CBC - Abnormal; Notable for the following:    WBC 10.9 (*)    RBC 3.58 (*)    Hemoglobin 11.4 (*)    MCV 100.8 (*)    All other components within normal limits  URINALYSIS, ROUTINE W REFLEX MICROSCOPIC (NOT AT [facility]) -  Abnormal; Notable for the following:    Protein, ur 30 (*)    All other components within normal limits  URINE MICROSCOPIC-ADD ON - Abnormal; Notable for the following:    Squamous Epithelial / LPF 0-5 (*)    All other components within normal limits  WET PREP, GENITAL  BODY FLUID CULTURE  GLUCOSE, PERITONEAL FLUID  PROTEIN, BODY FLUID  BODY FLUID CELL COUNT WITH DIFFERENTIAL  GC/CHLAMYDIA PROBE AMP ([place]) NOT AT [facility]    Imaging Review No results found. I have personally reviewed and evaluated these images and lab results as part of my medical decision-making.   EKG Interpretation None      MDM   Final diagnoses:  Left lower quadrant pain   50 yo AAF w/LLQ pain. On exam, NAD, AFVSS. DDx includes SBP, though with 4 days of tenderness, no fevers, and pain that is not generalized, this is less likely. Pt has had ovaries removed, so torsion and ovarian abscess not possible. No sx of STI or UTI.   Plan to work up for possible SBP and perform pelvic.  Before SBP workup and pelvic performed, pt eloped.    Pt was seen under the supervision of Dr. Kathrynn Humble.     Sherian Maroon, MD 07/07/15  2040  Varney Biles, MD 07/10/15 2330

## 2015-07-07 NOTE — ED Notes (Signed)
Pt is peritoneal dialysis pt, last treatment was last night. Pt having LLQ pain x 2 days with n/v. Denies diarrhea. Last bm was this am and normal per pt. Denies fever.

## 2015-07-07 NOTE — ED Notes (Signed)
Spoke with RN on 6E, they will come drain fluid to send for testing.

## 2015-07-08 ENCOUNTER — Emergency Department (HOSPITAL_BASED_OUTPATIENT_CLINIC_OR_DEPARTMENT_OTHER)
Admission: EM | Admit: 2015-07-08 | Discharge: 2015-07-08 | Disposition: A | Discharge status: Home / Self Care | Payer: Managed Care, Other (non HMO) | Attending: Emergency Medicine | Admitting: Emergency Medicine

## 2015-07-08 ENCOUNTER — Emergency Department (HOSPITAL_BASED_OUTPATIENT_CLINIC_OR_DEPARTMENT_OTHER): Payer: Managed Care, Other (non HMO)

## 2015-07-08 ENCOUNTER — Encounter (HOSPITAL_BASED_OUTPATIENT_CLINIC_OR_DEPARTMENT_OTHER): Payer: Self-pay | Admitting: *Deleted

## 2015-07-08 DIAGNOSIS — I120 Hypertensive chronic kidney disease with stage 5 chronic kidney disease or end stage renal disease: Secondary | ICD-10-CM | POA: Diagnosis not present

## 2015-07-08 DIAGNOSIS — Z79899 Other long term (current) drug therapy: Secondary | ICD-10-CM | POA: Diagnosis not present

## 2015-07-08 DIAGNOSIS — Z862 Personal history of diseases of the blood and blood-forming organs and certain disorders involving the immune mechanism: Secondary | ICD-10-CM | POA: Diagnosis not present

## 2015-07-08 DIAGNOSIS — R05 Cough: Secondary | ICD-10-CM | POA: Insufficient documentation

## 2015-07-08 DIAGNOSIS — R1032 Left lower quadrant pain: Secondary | ICD-10-CM | POA: Insufficient documentation

## 2015-07-08 DIAGNOSIS — Z992 Dependence on renal dialysis: Secondary | ICD-10-CM | POA: Insufficient documentation

## 2015-07-08 DIAGNOSIS — Z8639 Personal history of other endocrine, nutritional and metabolic disease: Secondary | ICD-10-CM | POA: Insufficient documentation

## 2015-07-08 DIAGNOSIS — N186 End stage renal disease: Secondary | ICD-10-CM | POA: Diagnosis not present

## 2015-07-08 HISTORY — DX: Dependence on renal dialysis: Z99.2

## 2015-07-08 LAB — URINALYSIS, ROUTINE W REFLEX MICROSCOPIC
Bilirubin Urine: NEGATIVE
Glucose, UA: NEGATIVE mg/dL
HGB URINE DIPSTICK: NEGATIVE
Ketones, ur: NEGATIVE mg/dL
LEUKOCYTES UA: NEGATIVE
Nitrite: NEGATIVE
Protein, ur: 30 mg/dL — AB
SPECIFIC GRAVITY, URINE: 1.016 (ref 1.005–1.030)
pH: 5 (ref 5.0–8.0)

## 2015-07-08 LAB — COMPREHENSIVE METABOLIC PANEL
ALT: 8 U/L — ABNORMAL LOW (ref 14–54)
ANION GAP: 13 (ref 5–15)
AST: 14 U/L — ABNORMAL LOW (ref 15–41)
Albumin: 3.4 g/dL — ABNORMAL LOW (ref 3.5–5.0)
Alkaline Phosphatase: 40 U/L (ref 38–126)
BUN: 84 mg/dL — ABNORMAL HIGH (ref 6–20)
CALCIUM: 8.3 mg/dL — AB (ref 8.9–10.3)
CHLORIDE: 105 mmol/L (ref 101–111)
CO2: 21 mmol/L — AB (ref 22–32)
Creatinine, Ser: 7.45 mg/dL — ABNORMAL HIGH (ref 0.44–1.00)
GFR, EST AFRICAN AMERICAN: 7 mL/min — AB (ref >60)
GFR, EST NON AFRICAN AMERICAN: 6 mL/min — AB (ref >60)
Glucose, Bld: 99 mg/dL (ref 65–99)
Potassium: 4.2 mmol/L (ref 3.5–5.1)
SODIUM: 139 mmol/L (ref 135–145)
Total Bilirubin: 0.4 mg/dL (ref 0.3–1.2)
Total Protein: 6.5 g/dL (ref 6.5–8.1)

## 2015-07-08 LAB — URINE MICROSCOPIC-ADD ON: WBC UA: NONE SEEN WBC/hpf (ref 0–5)

## 2015-07-08 LAB — CBC WITH DIFFERENTIAL/PLATELET
BASOS ABS: 0 10*3/uL (ref 0.0–0.1)
Basophils Relative: 0 %
EOS ABS: 0.2 10*3/uL (ref 0.0–0.7)
Eosinophils Relative: 2 %
HCT: 35.1 % — ABNORMAL LOW (ref 36.0–46.0)
Hemoglobin: 11 g/dL — ABNORMAL LOW (ref 12.0–15.0)
LYMPHS ABS: 1.1 10*3/uL (ref 0.7–4.0)
LYMPHS PCT: 11 %
MCH: 32.1 pg (ref 26.0–34.0)
MCHC: 31.3 g/dL (ref 30.0–36.0)
MCV: 102.3 fL — AB (ref 78.0–100.0)
MONO ABS: 1.2 10*3/uL — AB (ref 0.1–1.0)
Monocytes Relative: 12 %
NEUTROS ABS: 7.9 10*3/uL — AB (ref 1.7–7.7)
Neutrophils Relative %: 75 %
PLATELETS: 224 10*3/uL (ref 150–400)
RBC: 3.43 MIL/uL — ABNORMAL LOW (ref 3.87–5.11)
RDW: 12.5 % (ref 11.5–15.5)
WBC: 10.4 10*3/uL (ref 4.0–10.5)

## 2015-07-08 LAB — LIPASE, BLOOD: LIPASE: 55 U/L — AB (ref 11–51)

## 2015-07-08 MED ORDER — OXYCODONE-ACETAMINOPHEN 5-325 MG PO TABS: 1.0000 | ORAL_TABLET | ORAL | Status: DC | PRN

## 2015-07-08 MED ORDER — IOHEXOL 300 MG/ML  SOLN
50.0000 mL | Freq: Once | INTRAMUSCULAR | Status: AC | PRN
  Administered 2015-07-08: 50 mL via ORAL

## 2015-07-08 MED ORDER — IOHEXOL 300 MG/ML  SOLN
100.0000 mL | Freq: Once | INTRAMUSCULAR | Status: AC | PRN
  Administered 2015-07-08: 100 mL via INTRAVENOUS

## 2015-07-08 MED ORDER — ACETAMINOPHEN 325 MG PO TABS
650.0000 mg | ORAL_TABLET | Freq: Once | ORAL | Status: AC
  Administered 2015-07-08: 650 mg via ORAL
  Filled 2015-07-08: qty 2

## 2015-07-08 MED ORDER — MORPHINE SULFATE (PF) 4 MG/ML IV SOLN: 4.0000 mg | Freq: Once | INTRAVENOUS | Status: DC

## 2015-07-08 NOTE — ED Notes (Signed)
Abdominal pain for a week. Worse x 2 days. Lower abdominal pain. Pain is constant and sharp.

## 2015-07-08 NOTE — ED Notes (Signed)
Patient asked to change into a gown.  

## 2015-07-08 NOTE — ED Provider Notes (Signed)
CSN: [id number]     Arrival date & time 07/08/15  1446 History   First MD Initiated Contact with Patient 07/08/15 1725     Chief Complaint  Patient presents with  . Abdominal Pain     (Consider location/radiation/quality/duration/timing/severity/associated sxs/prior Treatment) Patient is a 50 y.o. female presenting with abdominal pain. The history is provided by the patient.  Abdominal Pain Pain location:  LLQ Pain quality: sharp   Pain radiates to:  Does not radiate Pain severity:  Moderate Onset quality:  Gradual Duration:  1 week Timing:  Constant Progression:  Worsening Chronicity:  New Context: not diet changes, not eating, not recent illness, not recent travel and not sick contacts   Relieved by:  None tried Worsened by:  Coughing and movement Ineffective treatments:  None tried Associated symptoms: cough   Associated symptoms: no chest pain, no chills, no constipation, no diarrhea, no dysuria, no fever, no hematemesis, no hematochezia, no hematuria, no melena, no nausea, no shortness of breath, no vaginal bleeding, no vaginal discharge and no vomiting    Terry Juarez is a 50 y.o. female with PMH significant for ESRD (on PD QHS), HLD, HTN who presents with 1 week history of gradually worsening, constant, sharp LLQ made worse with coughing or walking with no modifying factors who was seen yesterday at [facility], but eloped before workup completed.  Labs yesterday remarkable for lipase of 73, Cr 8.54 BUN 85, anion gap 16, WBC 10.9, HGB 11.4, urinalysis with 30 protein and no infection.   Past Medical History  Diagnosis Date  . Hypertension   . Hyperlipidemia   . Bruises easily   . Right renal mass   . Anemia   . Renal disorder     rt renal mass / < functioning of left kidney - being prepared for possible dialysis  . ESRD on dialysis ([place])   . Dialysis patient [facility])    Past Surgical History  Procedure Laterality Date  . Ectopic pregnancy surgery  1987  . Insertion  of dialysis catheter Right 07/11/2014    Procedure: INSERTION OF DIALYSIS CATHETER IN RIGHT INTERNAL JUGULAR ;  Surgeon: Mal Misty, MD;  Location: [place];  Service: Vascular;  Laterality: Right;  . Av fistula placement Right 07/11/2014    Procedure: ARTERIOVENOUS (AV) FISTULA CREATION RIGHT ARM BRACHIO-CEPHALIC WITH ATTEMPTED RADIO-CEPHALIC (AV) FISTULA;  Surgeon: Mal Misty, MD;  Location: [place];  Service: Vascular;  Laterality: Right;  . Thrombectomy w/ embolectomy Right 07/11/2014    Procedure: THROMBECTOMY OF RIGHT RADIAL ARTERY  ;  Surgeon: Mal Misty, MD;  Location: [place];  Service: Vascular;  Laterality: Right;  . Patch angioplasty Right 07/11/2014    Procedure: PATCH ANGIOPLASTY OF RIGHT RADIAL ARTERY USING CEPHALIC VEIN.;  Surgeon: Mal Misty, MD;  Location: [place];  Service: Vascular;  Laterality: Right;  . Laparoscopic nephrectomy Right 07/25/2014    Procedure: RIGHT LAPAROSCOPIC RADICAL NEPHRECTOMY ;  Surgeon: Ardis Hughs, MD;  [facility];  Service: Urology;  Laterality: Right;   Family History  Problem Relation Age of Onset  . Cancer Mother   . Hypertension Father    Social History  Substance Use Topics  . Smoking status: Never Smoker   . Smokeless tobacco: Never Used  . Alcohol Use: 0.0 oz/week    0 Standard drinks or equivalent per week     Comment: occ   OB History    No data available     Review of  Systems  Constitutional: Negative for fever and chills.  Respiratory: Positive for cough. Negative for shortness of breath.   Cardiovascular: Negative for chest pain.  Gastrointestinal: Positive for abdominal pain. Negative for nausea, vomiting, diarrhea, constipation, blood in stool, melena, hematochezia, abdominal distention and hematemesis.  Genitourinary: Negative for dysuria, urgency, frequency, hematuria, vaginal bleeding, vaginal discharge, vaginal pain and pelvic pain.  All other systems reviewed and are negative.     Allergies  Review of  patient's allergies indicates no known allergies.  Home Medications   Prior to Admission medications   Medication Sig Start Date End Date Taking? Authorizing Provider  acetaminophen (TYLENOL) 500 MG tablet Take 500 mg by mouth every 6 (six) hours as needed for moderate pain or headache.    Historical Provider, MD  amLODipine (NORVASC) 10 MG tablet Take 10 mg by mouth at bedtime.     Historical Provider, MD  calcitRIOL (ROCALTROL) 0.25 MCG capsule Take 0.5 mcg by mouth daily.     Historical Provider, MD  calcium acetate (PHOSLO) 667 MG capsule Take 667-2,001 mg by mouth 3 (three) times daily with meals. Take 2001 mg three times a day with meals  Take 667 mg with snacks twice daily    Historical Provider, MD  lidocaine-prilocaine (EMLA) cream Apply 1 application topically every Monday, Wednesday, and Friday with hemodialysis. 02/09/15   Historical Provider, MD  lisinopril (PRINIVIL,ZESTRIL) 20 MG tablet Take 20 mg by mouth daily.    Historical Provider, MD  multivitamin (RENA-VIT) TABS tablet Take 1 tablet by mouth daily.    Historical Provider, MD  oxyCODONE-acetaminophen (PERCOCET/ROXICET) 5-325 MG tablet Take 1 tablet by mouth every 4 (four) hours as needed for severe pain. 07/08/15   Amirra Herling, PA-C  SENSIPAR 60 MG tablet Take 60 mg by mouth daily. 12/17/14   Historical Provider, MD  sodium bicarbonate 650 MG tablet Take 650 mg by mouth 2 (two) times daily.    Historical Provider, MD   BP 128/78 mmHg  Pulse 88  Temp(Src) 99.3 F (37.4 C) (Oral)  Resp 18  Ht 5\' 5"  (1.651 m)  Wt 52.617 kg  BMI 19.30 kg/m2  SpO2 96% Physical Exam  Constitutional: She is oriented to person, place, and time. She appears well-developed and well-nourished.  Non-toxic appearance. She does not have a sickly appearance. She does not appear ill. No distress.  HENT:  Head: Normocephalic and atraumatic.  Mouth/Throat: Oropharynx is clear and moist.  Eyes: Conjunctivae are normal. Pupils are equal, round, and  reactive to light.  Neck: Normal range of motion. Neck supple.  Cardiovascular: Normal rate, regular rhythm and normal heart sounds.   No murmur heard. Pulmonary/Chest: Effort normal and breath sounds normal. No accessory muscle usage or stridor. No respiratory distress. She has no wheezes. She has no rhonchi. She has no rales.  Abdominal: Soft. Bowel sounds are normal. She exhibits no distension. There is tenderness (LLQ) in the left lower quadrant. There is no rigidity, no rebound, no guarding and no CVA tenderness.  Musculoskeletal: Normal range of motion.  Lymphadenopathy:    She has no cervical adenopathy.  Neurological: She is alert and oriented to person, place, and time.  Speech clear without dysarthria.  Skin: Skin is warm and dry.  Psychiatric: She has a normal mood and affect. Her behavior is normal.    ED Course  Procedures (including critical care time) Labs Review Labs Reviewed  URINALYSIS, [place] (NOT AT [facility]) - Abnormal; Notable for the following:  Protein, ur 30 (*)    All other components within normal limits  URINE MICROSCOPIC-ADD ON - Abnormal; Notable for the following:    Squamous Epithelial / LPF 0-5 (*)    Bacteria, UA RARE (*)    All other components within normal limits  LIPASE, BLOOD - Abnormal; Notable for the following:    Lipase 55 (*)    All other components within normal limits  CBC WITH DIFFERENTIAL/PLATELET - Abnormal; Notable for the following:    RBC 3.43 (*)    Hemoglobin 11.0 (*)    HCT 35.1 (*)    MCV 102.3 (*)    Neutro Abs 7.9 (*)    Monocytes Absolute 1.2 (*)    All other components within normal limits  COMPREHENSIVE METABOLIC PANEL - Abnormal; Notable for the following:    CO2 21 (*)    BUN 84 (*)    Creatinine, Ser 7.45 (*)    Calcium 8.3 (*)    Albumin 3.4 (*)    AST 14 (*)    ALT 8 (*)    GFR calc non Af Amer 6 (*)    GFR calc Af Amer 7 (*)    All other components within normal limits    Imaging  Review Dg Chest 2 View  07/08/2015  CLINICAL DATA:  Progressive cough for 2 weeks.  Emesis. EXAM: CHEST  2 VIEW COMPARISON:  01/24/2015 FINDINGS: The cardiomediastinal contours are normal. The lungs are clear. Pulmonary vasculature is normal. No consolidation, pleural effusion, or pneumothorax. No acute osseous abnormalities are seen. There is likely free air under the right hemidiaphragm, localized anteriorly on the lateral view. The peritoneal dialysis catheter is partially included. IMPRESSION: 1.  No acute pulmonary process. 2. Probable free air under the right hemidiaphragm, may be secondary to peritoneal dialysis catheter. CT of the abdomen/pelvis is planned for further evaluation. These results were discussed by telephone at the time of interpretation on 07/08/2015 at 6:45 pm to [place] , who verbally acknowledged these results. Electronically Signed   By: Jeb Levering M.D.   On: 07/08/2015 18:46   Ct Abdomen Pelvis W Contrast  07/08/2015  CLINICAL DATA:  Left lower quadrant abdominal pain for 1 week with vomiting. Right nephrectomy 1 year ago. Peritoneal dialysis over the past year. EXAM: CT ABDOMEN AND PELVIS WITH CONTRAST TECHNIQUE: Multidetector CT imaging of the abdomen and pelvis was performed using the standard protocol following bolus administration of intravenous contrast. CONTRAST:  50mL OMNIPAQUE IOHEXOL 300 MG/ML SOLN, 127mL OMNIPAQUE IOHEXOL 300 MG/ML SOLN COMPARISON:  01/24/2015 FINDINGS: Lower chest:  Unremarkable Hepatobiliary: Unremarkable Pancreas: Unremarkable Spleen: Unremarkable Adrenals/Urinary Tract: Right nephrectomy. Adrenal glands unremarkable. Left renal cortical thinning and lack of contrast excretion on delayed images. Indistinct hypodense lesions in the left kidney are probably cysts but technically nonspecific. The largest lesion measures up to 1.5 cm in long axis in the left kidney upper pole, measuring about 40 Hounsfield units on both portal venous and delayed  phase images. Urinary bladder unremarkable. Stomach/Bowel: Scattered locules of free intraperitoneal gas are present in the abdomen there is a relative paucity of intra-abdominal adipose tissue separating adjacent loops of bowel. Questionable bowel wall thickening of small bowel in the right abdomen as on image 34 series 2. Appendix normal and contrast filled. Contrast and stool in the colon. Loops of small bowel measure 3.2 cm in diameter, mildly dilated. No pneumatosis or portal venous gas observed. I do not see any leak of contrast from the bowel, with  contrast extending around to the sigmoid colon. There is sigmoid colon diverticulosis without observed active diverticulitis. Vascular/Lymphatic: Unremarkable Reproductive: There is some hypodensity of the cervix and lower uterine segment which are probably incidental. Other: There is confluent infiltration of the omentum, probably from fluid infiltration related to the peritoneal dialysis. The peritoneal dialysis catheter enters in the left upper quadrant and is coiled in the left lower quadrant adjacent to the left margin of the urinary bladder. Musculoskeletal: Unremarkable IMPRESSION: 1. Abnormal locules of free intraperitoneal gas are present. I do not see an obvious cause for bowel perforation, although there are some mildly dilated loops of small bowel in the left abdomen, and potentially a thick walled loop of small bowel in the right abdomen. Improper technique during peritoneal dialysis can cause gas to be introduced into the peritoneum, and is the likely cause of the free intraperitoneal gas in this patient given the lack of an obvious site of perforation. That said, if the patient were to become septic or develop signs of peritonitis, exploratory laparotomy might be required. 2. There is infiltration of the omentum, likely to be related to the peritoneal dialysis. A very similar appearance can be present in the setting of peritoneal spread of mucinous  gastrointestinal or ovarian cancer, but I am not provided with a history of gastrointestinal or ovarian cancer. 3. Complex left renal lesions are stable. The dominant left kidney upper pole lesion demonstrates no difference in density between portal venous and delayed phase images and accordingly is probably a complex cyst. 4. Sigmoid diverticulosis. Electronically Signed   By: Van Clines M.D.   On: 07/08/2015 21:32   I have personally reviewed and evaluated these images and lab results as part of my medical decision-making.   EKG Interpretation None      MDM   Final diagnoses:  Left lower quadrant pain    Patient with ESRD on PD presents with 1w hx of LLQ pain.  No fever, N/V/D/C, bloody stools.  No vaginal complaints or urinary symptoms. Blood pressure 136/82, pulse 76, temperature 98.5 F (36.9 C), temperature source Oral, resp. rate 20, height 5\' 9"  (1.753 m), weight 81.647 kg, SpO2 100 %.  On exam, heart RRR, lungs CTAB, abdomen soft, nondistended, with tenderness in LLQ.  No rebound or guarding.  Concern for infectious etiology, diverticulitis.  Doubt SBP.  Doubt torsion or TOA.  Will obtain labs and CT abdomen/pelvis.    Lipase 55. CBC with WBC 10.4, HGB 11.0 CMP remarkable for Cr 7.45 UA unremarkable CXR negative.  Probable free air under right hemidiaphragm which is secondary to PD catheter. CT abdomen/pelvis shows no acute intra-abdominal pathology.  Abnormal locules of free intraperitoneal gas as present; however, likely related to peritoneal dialysis. Sigmoid diverticulosis.  Stable left renal lesions.  Patient appears well.  Patient appears non-toxic or septic.  Labs and imaging are reassuring.  Evaluation does not show pathology requiring ongoing emergent intervention or admission. Pt is hemodynamically stable and mentating appropriately. Discussed findings/results and plan with patient/guardian, who agrees with plan. All questions answered. Strict return precautions  discussed and outpatient follow up given.   Case has been discussed with Dr. Jeanell Sparrow who agrees with the above plan for discharge.     Gloriann Loan, PA-C 07/08/15 [place], MD 07/08/15 [phone]

## 2015-07-08 NOTE — Discharge Instructions (Signed)

## 2015-07-08 NOTE — ED Notes (Signed)
Pt. Does her own peritoneal  Dialysis and has no trouble with the site at present time.  Pt. Site is clean and dry with dressing clean and dry.

## 2015-07-10 DIAGNOSIS — T82898D Other specified complication of vascular prosthetic devices, implants and grafts, subsequent encounter: Secondary | ICD-10-CM | POA: Diagnosis not present

## 2015-07-10 DIAGNOSIS — Z992 Dependence on renal dialysis: Secondary | ICD-10-CM | POA: Diagnosis not present

## 2015-07-10 DIAGNOSIS — N186 End stage renal disease: Secondary | ICD-10-CM | POA: Diagnosis not present

## 2015-07-10 DIAGNOSIS — I871 Compression of vein: Secondary | ICD-10-CM | POA: Diagnosis not present

## 2015-07-12 DIAGNOSIS — N186 End stage renal disease: Secondary | ICD-10-CM | POA: Diagnosis not present

## 2015-07-12 DIAGNOSIS — Z992 Dependence on renal dialysis: Secondary | ICD-10-CM | POA: Diagnosis not present

## 2015-07-12 DIAGNOSIS — I871 Compression of vein: Secondary | ICD-10-CM | POA: Diagnosis not present

## 2015-07-12 DIAGNOSIS — T82858D Stenosis of vascular prosthetic devices, implants and grafts, subsequent encounter: Secondary | ICD-10-CM | POA: Diagnosis not present

## 2015-08-06 DIAGNOSIS — Z992 Dependence on renal dialysis: Secondary | ICD-10-CM | POA: Diagnosis not present

## 2015-08-06 DIAGNOSIS — N041 Nephrotic syndrome with focal and segmental glomerular lesions: Secondary | ICD-10-CM | POA: Diagnosis not present

## 2015-08-06 DIAGNOSIS — N186 End stage renal disease: Secondary | ICD-10-CM | POA: Diagnosis not present

## 2015-08-08 DIAGNOSIS — R880 Cloudy (hemodialysis) (peritoneal) dialysis effluent: Secondary | ICD-10-CM | POA: Insufficient documentation

## 2015-08-12 DIAGNOSIS — N186 End stage renal disease: Secondary | ICD-10-CM | POA: Diagnosis not present

## 2015-08-12 DIAGNOSIS — Z452 Encounter for adjustment and management of vascular access device: Secondary | ICD-10-CM | POA: Diagnosis not present

## 2015-08-13 DIAGNOSIS — D688 Other specified coagulation defects: Secondary | ICD-10-CM | POA: Insufficient documentation

## 2015-09-03 DIAGNOSIS — N186 End stage renal disease: Secondary | ICD-10-CM | POA: Diagnosis not present

## 2015-09-03 DIAGNOSIS — N041 Nephrotic syndrome with focal and segmental glomerular lesions: Secondary | ICD-10-CM | POA: Diagnosis not present

## 2015-09-03 DIAGNOSIS — Z992 Dependence on renal dialysis: Secondary | ICD-10-CM | POA: Diagnosis not present

## 2015-09-19 DIAGNOSIS — N186 End stage renal disease: Secondary | ICD-10-CM | POA: Diagnosis not present

## 2015-09-19 DIAGNOSIS — Z992 Dependence on renal dialysis: Secondary | ICD-10-CM | POA: Diagnosis not present

## 2015-09-23 ENCOUNTER — Other Ambulatory Visit: Payer: Self-pay | Admitting: Nephrology

## 2015-09-23 ENCOUNTER — Ambulatory Visit
Admission: RE | Admit: 2015-09-23 | Discharge: 2015-09-23 | Disposition: A | Discharge status: Home / Self Care | Payer: Managed Care, Other (non HMO) | Source: Ambulatory Visit | Attending: Nephrology | Admitting: Nephrology

## 2015-09-23 DIAGNOSIS — R059 Cough, unspecified: Secondary | ICD-10-CM

## 2015-09-23 DIAGNOSIS — R05 Cough: Secondary | ICD-10-CM

## 2015-10-15 DIAGNOSIS — N186 End stage renal disease: Secondary | ICD-10-CM | POA: Diagnosis not present

## 2015-10-15 DIAGNOSIS — Z992 Dependence on renal dialysis: Secondary | ICD-10-CM | POA: Diagnosis not present

## 2015-11-12 DIAGNOSIS — R17 Unspecified jaundice: Secondary | ICD-10-CM | POA: Insufficient documentation

## 2016-01-09 ENCOUNTER — Encounter: Payer: Self-pay | Admitting: Internal Medicine

## 2016-01-09 ENCOUNTER — Ambulatory Visit (INDEPENDENT_AMBULATORY_CARE_PROVIDER_SITE_OTHER): Payer: Managed Care, Other (non HMO) | Admitting: Internal Medicine

## 2016-01-09 VITALS — BP 144/82 | HR 99 | Ht 65.0 in | Wt 114.0 lb

## 2016-01-09 DIAGNOSIS — R05 Cough: Secondary | ICD-10-CM | POA: Diagnosis not present

## 2016-01-09 DIAGNOSIS — R059 Cough, unspecified: Secondary | ICD-10-CM | POA: Insufficient documentation

## 2016-01-09 DIAGNOSIS — I10 Essential (primary) hypertension: Secondary | ICD-10-CM | POA: Diagnosis not present

## 2016-01-09 MED ORDER — FAMOTIDINE 20 MG PO TABS: ORAL_TABLET | ORAL | Status: DC

## 2016-01-09 MED ORDER — PANTOPRAZOLE SODIUM 40 MG PO TBEC: 40.0000 mg | DELAYED_RELEASE_TABLET | Freq: Every day | ORAL | Status: DC

## 2016-01-09 MED ORDER — ACETAMINOPHEN-CODEINE #3 300-30 MG PO TABS: ORAL_TABLET | ORAL | Status: DC

## 2016-01-09 NOTE — Progress Notes (Signed)
Subjective:    Patient ID: Terry Juarez, female    DOB: 1966-04-18,    MRN: [id number]  HPI   38 yobf never smoker no prior h/o sign pulmonary problems though ? Sinus HA as child/adult referred to pulmonary clinic 01/09/2016 by Dr Clover Mealy with refractory cough since 07/2015   01/09/2016 1st [facility] office visit/ Debbie Yearick   Chief Complaint  Patient presents with  . Pulmonary Consult    Referred by Dr. Dorothea Ogle. Pt c/o cough x 4 months, prod at times with clear sputum. Cough is esp worse after eating and when she lies down. She sometimes coughs until she vomits. Cough wakes her up every night.   cough waxed and wanes not resolved p trial  off acei so restarted and worse since then esp at hs and also p eating with coughs so hard she vomits and freq awakening feeling choked. Not sob unless coughing/ no change before or p HD  No obvious other patterns in day to day or daytime variabilty or assoc chronic cough or cp or chest tightness, subjective wheeze overt sinus or hb symptoms. No unusual exp hx or h/o childhood pna/ asthma or knowledge of premature birth.  Sleeping ok without nocturnal  or early am exacerbation  of respiratory  c/o's or need for noct saba. Also denies any obvious fluctuation of symptoms with weather or environmental changes or other aggravating or alleviating factors except as outlined above   Current Medications, Allergies, Complete Past Medical History, Past Surgical History, Family History, and Social History were reviewed in [employer] record.            Review of Systems  Constitutional: Positive for appetite change. Negative for fever, chills and unexpected weight change.  HENT: Negative for congestion, dental problem, ear pain, nosebleeds, postnasal drip, rhinorrhea, sinus pressure, sneezing, sore throat, trouble swallowing and voice change.   Eyes: Negative for visual disturbance.  Respiratory: Positive for cough. Negative  for choking and shortness of breath.   Cardiovascular: Negative for chest pain and leg swelling.  Gastrointestinal: Negative for vomiting, abdominal pain and diarrhea.  Genitourinary: Negative for difficulty urinating.  Musculoskeletal: Negative for arthralgias.  Skin: Negative for rash.  Neurological: Positive for headaches. Negative for tremors and syncope.  Hematological: Does not bruise/bleed easily.       Objective:   Physical Exam  amb hoarse bf nad  Wt Readings from Last 3 Encounters:  01/09/16 114 lb (51.71 kg)  07/08/15 116 lb (52.617 kg)  02/12/15 117 lb (53.071 kg)    Vital signs reviewed   HEENT: nl dentition, turbinates, and oropharynx. Wax impaction both external ear canals without cough reflex   NECK :  without JVD/Nodes/TM/ nl carotid upstrokes bilaterally   LUNGS: no acc muscle use,  Nl contour chest which is clear to A and P bilaterally without cough on insp or exp maneuvers   CV:  RRR  no s3 or murmur or increase in P2, no edema   ABD:  soft and nontender with nl inspiratory excursion in the supine position. No bruits or organomegaly, bowel sounds nl  MS:  Nl gait/ ext warm without deformities, calf tenderness, cyanosis or clubbing No obvious joint restrictions   SKIN: warm and dry without lesions    NEURO:  alert, approp, nl sensorium with  no motor deficits   CXR PA and Lateral:   01/09/2016 :   Pt requested to go to CXR but was a no show  Assessment & Plan:

## 2016-01-09 NOTE — Assessment & Plan Note (Addendum)
Onset Jan 2017 while on ACEi - rec permanent d/c ACEi 01/09/2016 >>>  - max rx for GERD 01/09/2016 >>>   The most common causes of chronic cough in immunocompetent adults include the following: upper airway cough syndrome (UACS), previously referred to as postnasal drip syndrome (PNDS), which is caused by variety of rhinosinus conditions; (2) asthma; (3) GERD; (4) chronic bronchitis from cigarette smoking or other inhaled environmental irritants; (5) nonasthmatic eosinophilic bronchitis; and (6) bronchiectasis.   These conditions, singly or in combination, have accounted for up to 94% of the causes of chronic cough in prospective studies.   Other conditions have constituted no >6% of the causes in prospective studies These have included bronchogenic carcinoma, chronic interstitial pneumonia, sarcoidosis, left ventricular failure, ACEI-induced cough, and aspiration from a condition associated with pharyngeal dysfunction.    Chronic cough is often simultaneously caused by more than one condition. A single cause has been found from 38 to 82% of the time, multiple causes from 18 to 62%. Multiply caused cough has been the result of three diseases up to 42% of the time.    reviewed with Pt:   Most likely this is  Classic Upper airway cough syndrome, so named because it's frequently impossible to sort out how much is  CR/sinusitis with freq throat clearing (which can be related to primary GERD)   vs  causing  secondary (" extra esophageal")  GERD from wide swings in gastric pressure that occur with throat clearing, often  promoting self use of mint and menthol lozenges that reduce the lower esophageal sphincter tone and exacerbate the problem further in a cyclical fashion.   These are the same pts (now being labeled as having "irritable larynx syndrome" by some cough centers) who not infrequently have a history of having failed to tolerate ace inhibitors,  dry powder inhalers or biphosphonates or report having  atypical reflux symptoms that don't respond to standard doses of PPI , and are easily confused as having aecopd or asthma flares by even experienced allergists/ pulmonologists.   Of the three most common causes of chronic cough, only one (GERD)  can actually cause the other two (asthma and post nasal drip syndrome)  and perpetuate the cylce of cough inducing airway trauma, inflammation, heightened sensitivity to reflux which is prompted by the cough itself via a cyclical mechanism and definitely augmented by upper airway bradykinins the metabolism of which is inhibited by ACEi     This may partially respond to steroids and look like asthma and post nasal drainage but never erradicated completely unless the cough and the secondary reflux are eliminated, preferably both at the same time.  While not intuitively obvious, many patients with chronic low grade reflux do not cough until there is a secondary insult that disturbs the protective epithelial barrier and exposes sensitive nerve endings.  This can be viral or direct physical injury such as with an endotracheal tube.   The point is that once this occurs, it is difficult to eliminate using anything but a maximally effective acid suppression regimen at least in the short run, accompanied by an appropriate diet to address non acid GERD.   rec max rx for gerd/ off acei x 6 weeks then regroup

## 2016-01-09 NOTE — Patient Instructions (Addendum)
You will need to be off the lisinopril for 6 weeks at a minimum or until we have this cough gone for at least 2 weeks - I will contact Dr Clover Mealy for substitute   For cough > tylenol #3 one every 4 hours as needed  Pantoprazole (protonix) 40 mg   Take  30-60 min before first meal of the day and Pepcid (famotidine)  20 mg one @  bedtime until return to office - this is the best way to tell whether stomach acid is contributing to your problem.    Please remember to go to the  x-ray department downstairs for your tests - we will call you with the results when they are available.  GERD (REFLUX)  is an extremely common cause of respiratory symptoms just like yours , many times with no obvious heartburn at all.    It can be treated with medication, but also with lifestyle changes including elevation of the head of your bed (ideally with 6 inch  bed blocks),  Smoking cessation, avoidance of late meals, excessive alcohol, and avoid fatty foods, chocolate, peppermint, colas, red wine, and acidic juices such as orange juice.  NO MINT OR MENTHOL PRODUCTS SO NO COUGH DROPS  USE SUGARLESS CANDY INSTEAD (Jolley ranchers or Stover's or Life Savers) or even ice chips will also do - the key is to swallow to prevent all throat clearing. NO OIL BASED VITAMINS - use powdered substitutes.    Please schedule a follow up office visit in 6 weeks, call sooner if needed  Late add : failed to go for cxr as rec, needs one on return if still coughing

## 2016-01-09 NOTE — Assessment & Plan Note (Signed)
Although even in retrospect it may not be clear the ACEi contributed to the pt's symptoms, adding them back at this point or in the future would risk confusion in interpretation of non-specific respiratory symptoms to which this patient is prone  ie  Better not to muddy the waters here and rec off acei permanently in the setting of so many alternatives in pt with esrf.  Will notify Dr Clover Mealy and defer specific alternatives to renal service

## 2016-02-21 ENCOUNTER — Ambulatory Visit: Payer: Managed Care, Other (non HMO) | Admitting: Internal Medicine

## 2016-05-20 DIAGNOSIS — E2839 Other primary ovarian failure: Secondary | ICD-10-CM | POA: Diagnosis not present

## 2016-05-20 DIAGNOSIS — Z124 Encounter for screening for malignant neoplasm of cervix: Secondary | ICD-10-CM | POA: Diagnosis not present

## 2016-05-20 DIAGNOSIS — Z01419 Encounter for gynecological examination (general) (routine) without abnormal findings: Secondary | ICD-10-CM | POA: Diagnosis not present

## 2016-05-22 DIAGNOSIS — Z1231 Encounter for screening mammogram for malignant neoplasm of breast: Secondary | ICD-10-CM | POA: Diagnosis not present

## 2016-07-09 DIAGNOSIS — N051 Unspecified nephritic syndrome with focal and segmental glomerular lesions: Secondary | ICD-10-CM | POA: Insufficient documentation

## 2016-07-29 ENCOUNTER — Inpatient Hospital Stay (HOSPITAL_COMMUNITY)
Admission: EM | Admit: 2016-07-29 | Discharge: 2016-08-02 | DRG: 811 | Disposition: A | Discharge status: Home / Self Care | Payer: BLUE CROSS/BLUE SHIELD | Attending: Internal Medicine | Admitting: Internal Medicine

## 2016-07-29 ENCOUNTER — Inpatient Hospital Stay (HOSPITAL_COMMUNITY): Payer: BLUE CROSS/BLUE SHIELD

## 2016-07-29 ENCOUNTER — Encounter (HOSPITAL_COMMUNITY): Payer: Self-pay

## 2016-07-29 ENCOUNTER — Emergency Department (HOSPITAL_COMMUNITY): Payer: BLUE CROSS/BLUE SHIELD

## 2016-07-29 DIAGNOSIS — K254 Chronic or unspecified gastric ulcer with hemorrhage: Secondary | ICD-10-CM | POA: Diagnosis present

## 2016-07-29 DIAGNOSIS — Z808 Family history of malignant neoplasm of other organs or systems: Secondary | ICD-10-CM

## 2016-07-29 DIAGNOSIS — K922 Gastrointestinal hemorrhage, unspecified: Secondary | ICD-10-CM | POA: Diagnosis not present

## 2016-07-29 DIAGNOSIS — D62 Acute posthemorrhagic anemia: Principal | ICD-10-CM | POA: Diagnosis present

## 2016-07-29 DIAGNOSIS — M25561 Pain in right knee: Secondary | ICD-10-CM | POA: Diagnosis not present

## 2016-07-29 DIAGNOSIS — Z8249 Family history of ischemic heart disease and other diseases of the circulatory system: Secondary | ICD-10-CM

## 2016-07-29 DIAGNOSIS — N281 Cyst of kidney, acquired: Secondary | ICD-10-CM | POA: Diagnosis present

## 2016-07-29 DIAGNOSIS — E785 Hyperlipidemia, unspecified: Secondary | ICD-10-CM | POA: Diagnosis present

## 2016-07-29 DIAGNOSIS — E860 Dehydration: Secondary | ICD-10-CM | POA: Diagnosis present

## 2016-07-29 DIAGNOSIS — D649 Anemia, unspecified: Secondary | ICD-10-CM | POA: Diagnosis present

## 2016-07-29 DIAGNOSIS — A09 Infectious gastroenteritis and colitis, unspecified: Secondary | ICD-10-CM | POA: Diagnosis present

## 2016-07-29 DIAGNOSIS — N186 End stage renal disease: Secondary | ICD-10-CM | POA: Diagnosis not present

## 2016-07-29 DIAGNOSIS — D509 Iron deficiency anemia, unspecified: Secondary | ICD-10-CM | POA: Diagnosis not present

## 2016-07-29 DIAGNOSIS — R55 Syncope and collapse: Secondary | ICD-10-CM | POA: Diagnosis not present

## 2016-07-29 DIAGNOSIS — Z992 Dependence on renal dialysis: Secondary | ICD-10-CM

## 2016-07-29 DIAGNOSIS — M25562 Pain in left knee: Secondary | ICD-10-CM | POA: Diagnosis not present

## 2016-07-29 DIAGNOSIS — R079 Chest pain, unspecified: Secondary | ICD-10-CM | POA: Diagnosis present

## 2016-07-29 DIAGNOSIS — R188 Other ascites: Secondary | ICD-10-CM | POA: Diagnosis not present

## 2016-07-29 DIAGNOSIS — I10 Essential (primary) hypertension: Secondary | ICD-10-CM | POA: Diagnosis present

## 2016-07-29 DIAGNOSIS — E861 Hypovolemia: Secondary | ICD-10-CM | POA: Diagnosis present

## 2016-07-29 DIAGNOSIS — K921 Melena: Secondary | ICD-10-CM | POA: Diagnosis present

## 2016-07-29 DIAGNOSIS — I959 Hypotension, unspecified: Secondary | ICD-10-CM | POA: Diagnosis not present

## 2016-07-29 DIAGNOSIS — N2889 Other specified disorders of kidney and ureter: Secondary | ICD-10-CM | POA: Diagnosis present

## 2016-07-29 DIAGNOSIS — Z85528 Personal history of other malignant neoplasm of kidney: Secondary | ICD-10-CM

## 2016-07-29 DIAGNOSIS — Z905 Acquired absence of kidney: Secondary | ICD-10-CM

## 2016-07-29 DIAGNOSIS — R6510 Systemic inflammatory response syndrome (SIRS) of non-infectious origin without acute organ dysfunction: Secondary | ICD-10-CM | POA: Diagnosis not present

## 2016-07-29 DIAGNOSIS — R195 Other fecal abnormalities: Secondary | ICD-10-CM | POA: Diagnosis not present

## 2016-07-29 DIAGNOSIS — I120 Hypertensive chronic kidney disease with stage 5 chronic kidney disease or end stage renal disease: Secondary | ICD-10-CM | POA: Diagnosis present

## 2016-07-29 LAB — COMPREHENSIVE METABOLIC PANEL
ALK PHOS: 18 U/L — AB (ref 38–126)
ALT: 10 U/L — ABNORMAL LOW (ref 14–54)
AST: 14 U/L — ABNORMAL LOW (ref 15–41)
Albumin: 2.6 g/dL — ABNORMAL LOW (ref 3.5–5.0)
Anion gap: 17 — ABNORMAL HIGH (ref 5–15)
BILIRUBIN TOTAL: 0.5 mg/dL (ref 0.3–1.2)
BUN: 123 mg/dL — AB (ref 6–20)
CALCIUM: 7.8 mg/dL — AB (ref 8.9–10.3)
CO2: 20 mmol/L — ABNORMAL LOW (ref 22–32)
CREATININE: 11.71 mg/dL — AB (ref 0.44–1.00)
Chloride: 105 mmol/L (ref 101–111)
GFR calc Af Amer: 4 mL/min — ABNORMAL LOW (ref >60)
GFR, EST NON AFRICAN AMERICAN: 3 mL/min — AB (ref >60)
GLUCOSE: 106 mg/dL — AB (ref 65–99)
Potassium: 5.1 mmol/L (ref 3.5–5.1)
Sodium: 142 mmol/L (ref 135–145)
TOTAL PROTEIN: 4.4 g/dL — AB (ref 6.5–8.1)

## 2016-07-29 LAB — CBC WITH DIFFERENTIAL/PLATELET
Basophils Absolute: 0.1 10*3/uL (ref 0.0–0.1)
Basophils Relative: 0 %
EOS ABS: 0.1 10*3/uL (ref 0.0–0.7)
Eosinophils Relative: 0 %
HCT: 12.6 % — ABNORMAL LOW (ref 36.0–46.0)
Hemoglobin: 4 g/dL — CL (ref 12.0–15.0)
LYMPHS ABS: 1.4 10*3/uL (ref 0.7–4.0)
LYMPHS PCT: 8 %
MCH: 32.5 pg (ref 26.0–34.0)
MCHC: 31.7 g/dL (ref 30.0–36.0)
MCV: 102.4 fL — AB (ref 78.0–100.0)
Monocytes Absolute: 1.1 10*3/uL — ABNORMAL HIGH (ref 0.1–1.0)
Monocytes Relative: 6 %
NEUTROS PCT: 86 %
Neutro Abs: 16.3 10*3/uL — ABNORMAL HIGH (ref 1.7–7.7)
Platelets: 244 10*3/uL (ref 150–400)
RBC: 1.23 MIL/uL — AB (ref 3.87–5.11)
RDW: 14.4 % (ref 11.5–15.5)
WBC: 18.9 10*3/uL — AB (ref 4.0–10.5)

## 2016-07-29 LAB — PREPARE RBC (CROSSMATCH)

## 2016-07-29 LAB — PROTIME-INR
INR: 1.14
Prothrombin Time: 14.6 seconds (ref 11.4–15.2)

## 2016-07-29 LAB — POC OCCULT BLOOD, ED: FECAL OCCULT BLD: POSITIVE — AB

## 2016-07-29 LAB — ABO/RH: ABO/RH(D): O POS

## 2016-07-29 LAB — PROCALCITONIN: PROCALCITONIN: 1.03 ng/mL

## 2016-07-29 LAB — TROPONIN I: Troponin I: <0.03 ng/mL (ref <0.03)

## 2016-07-29 MED ORDER — CALCIUM ACETATE (PHOS BINDER) 667 MG PO CAPS
667.0000 mg | ORAL_CAPSULE | ORAL | Status: DC | PRN
  Filled 2016-07-29 (×3): qty 1

## 2016-07-29 MED ORDER — ACETAMINOPHEN 650 MG RE SUPP: 650.0000 mg | Freq: Four times a day (QID) | RECTAL | Status: DC | PRN

## 2016-07-29 MED ORDER — CINACALCET HCL 30 MG PO TABS
60.0000 mg | ORAL_TABLET | ORAL | Status: DC
  Administered 2016-07-31: 60 mg via ORAL
  Filled 2016-07-29: qty 2

## 2016-07-29 MED ORDER — ONDANSETRON HCL 4 MG PO TABS: 4.0000 mg | ORAL_TABLET | Freq: Four times a day (QID) | ORAL | Status: DC | PRN

## 2016-07-29 MED ORDER — ACETAMINOPHEN 325 MG PO TABS: 650.0000 mg | ORAL_TABLET | Freq: Four times a day (QID) | ORAL | Status: DC | PRN

## 2016-07-29 MED ORDER — SODIUM CHLORIDE 0.9% FLUSH
3.0000 mL | Freq: Two times a day (BID) | INTRAVENOUS | Status: DC
  Administered 2016-07-29 – 2016-08-02 (×6): 3 mL via INTRAVENOUS

## 2016-07-29 MED ORDER — SODIUM CHLORIDE 0.9% FLUSH
3.0000 mL | Freq: Two times a day (BID) | INTRAVENOUS | Status: DC
  Administered 2016-07-30 – 2016-08-02 (×6): 3 mL via INTRAVENOUS

## 2016-07-29 MED ORDER — SODIUM CHLORIDE 0.9 % IV SOLN
80.0000 mg | Freq: Once | INTRAVENOUS | Status: AC
  Administered 2016-07-29: 21:00:00 80 mg via INTRAVENOUS
  Filled 2016-07-29: qty 80

## 2016-07-29 MED ORDER — SODIUM CHLORIDE 0.9 % IV SOLN
8.0000 mg/h | INTRAVENOUS | Status: DC
  Administered 2016-07-29 – 2016-07-31 (×4): 8 mg/h via INTRAVENOUS
  Filled 2016-07-29 (×8): qty 80

## 2016-07-29 MED ORDER — SODIUM CHLORIDE 0.9% FLUSH: 3.0000 mL | INTRAVENOUS | Status: DC | PRN

## 2016-07-29 MED ORDER — ACETAMINOPHEN 325 MG PO TABS
650.0000 mg | ORAL_TABLET | Freq: Once | ORAL | Status: AC
  Administered 2016-07-29: 650 mg via ORAL
  Filled 2016-07-29: qty 2

## 2016-07-29 MED ORDER — HYDROCODONE-ACETAMINOPHEN 5-325 MG PO TABS
1.0000 | ORAL_TABLET | ORAL | Status: DC | PRN
Start: 2016-07-29 — End: 2016-08-02

## 2016-07-29 MED ORDER — DIPHENHYDRAMINE HCL 25 MG PO CAPS
25.0000 mg | ORAL_CAPSULE | Freq: Once | ORAL | Status: AC
  Administered 2016-07-29: 25 mg via ORAL
  Filled 2016-07-29: qty 1

## 2016-07-29 MED ORDER — SODIUM CHLORIDE 0.9 % IV SOLN: 250.0000 mL | INTRAVENOUS | Status: DC | PRN

## 2016-07-29 MED ORDER — VANCOMYCIN HCL 10 G IV SOLR
1500.0000 mg | Freq: Once | INTRAVENOUS | Status: DC
  Filled 2016-07-29: qty 1500

## 2016-07-29 MED ORDER — ONDANSETRON HCL 4 MG/2ML IJ SOLN: 4.0000 mg | Freq: Four times a day (QID) | INTRAMUSCULAR | Status: DC | PRN

## 2016-07-29 MED ORDER — PIPERACILLIN-TAZOBACTAM 3.375 G IVPB 30 MIN: 3.3750 g | Freq: Once | INTRAVENOUS | Status: DC

## 2016-07-29 MED ORDER — SODIUM CHLORIDE 0.9 % IV SOLN: 10.0000 mL/h | Freq: Once | INTRAVENOUS | Status: DC

## 2016-07-29 MED ORDER — SODIUM BICARBONATE 650 MG PO TABS
650.0000 mg | ORAL_TABLET | Freq: Two times a day (BID) | ORAL | Status: DC
  Administered 2016-07-29 – 2016-08-02 (×8): 650 mg via ORAL
  Filled 2016-07-29 (×10): qty 1

## 2016-07-29 MED ORDER — PIPERACILLIN-TAZOBACTAM IN DEX 2-0.25 GM/50ML IV SOLN
2.2500 g | Freq: Two times a day (BID) | INTRAVENOUS | Status: DC
  Filled 2016-07-29: qty 50

## 2016-07-29 MED ORDER — PANTOPRAZOLE SODIUM 40 MG IV SOLR: 40.0000 mg | Freq: Two times a day (BID) | INTRAVENOUS | Status: DC

## 2016-07-29 MED ORDER — CALCIUM ACETATE (PHOS BINDER) 667 MG PO CAPS
2001.0000 mg | ORAL_CAPSULE | Freq: Three times a day (TID) | ORAL | Status: DC
  Administered 2016-07-30 – 2016-08-02 (×8): 2001 mg via ORAL
  Filled 2016-07-29 (×9): qty 3

## 2016-07-29 MED ORDER — CALCITRIOL 0.25 MCG PO CAPS
0.2500 ug | ORAL_CAPSULE | Freq: Three times a day (TID) | ORAL | Status: DC
  Administered 2016-07-30 – 2016-08-02 (×12): 0.25 ug via ORAL
  Filled 2016-07-29 (×13): qty 1

## 2016-07-29 MED ORDER — SODIUM CHLORIDE 0.9 % IV SOLN
Freq: Once | INTRAVENOUS | Status: AC
  Administered 2016-07-30: via INTRAVENOUS

## 2016-07-29 NOTE — H&P (Addendum)
Terry Juarez [id number] DOB: 1965/11/22 DOA: 07/29/2016     PCP: [name], MD   Outpatient Specialists: Dr. Melvyn Novas pulmonology, Dr. Clover Mealy Renal Patient coming from:    home Lives With roomate    Chief Complaint: Syncope  HPI: Terry Juarez is a 51 y.o. 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 Reports unsure if she hit her head but not sore no evidence of injury.  She is been lightheaded Reports yesterday associated chest pain while at work she checked her BP and systolic's were down in mid 90's. Chest pain has resolved.  no nausea or vomiting she has finished her peritoneal dialysis as scheduled. She has not been felling sick for the past 2 days with diarrhea with dark "tarry" stool, denies NSAID use, reports chills and low grade fever 99. She has taken some tylenol. Reports outbreak of Norovirus at work. She have had flu shot this year. No cough. Reports rigors but no body aches.   She called EMS who brought [facility]  Syncope occurred when tried to get up from the bed she had to lay down on the bathroom floor for at least 1 hour because she was so lightheaded. Also occurred one try to get up to use of bathroom. She has hx of dark stool mostly diarrhea for the past few days. Denies NSAID use She has not had any localized weakness no headache no confusion no shortness of breath   Regarding pertinent Chronic problems: Patient on  PD QHS, she is being evaluated for renal transplant. She has hx of renal cell carcinoma on the right that has been removed in 2016 not on chemotherapy, since nephrectomy patient have required dialysis.    IN ER:  Temp (24hrs), Avg:98.3 F (36.8 C), Min:98.3 F (36.8 C),  Max:98.3 F (36.8 C)      RR 28 HR 107 BP 117/60 INR 1.14 WBC 18.9 Hg 4.0 down from 11 1 year ago  Sodium 142 potassium 5.1 BUN 123  Cr 11.71 albumin 2.6 Hemoccult positive  Chest x-ray nonacute Right and Left knee films  no fracture Following Medications were ordered in ER: Medications  0.9 %  sodium chloride infusion (not administered)     ER provider discussed case with:  Nephrology Dr. Senaida Lange who will assist with PD Dialysis orders.   Hospitalist was called for admission for syncope in the setting of Gi bleed with symptomatic anemia , Dehydration SIRS  Review of Systems:    Pertinent positives include: syncope, Fevers, chills, fatigue,   Constitutional:  No weight loss, night sweats, weight loss  HEENT:  No headaches, Difficulty swallowing,Tooth/dental problems,Sore throat,  No sneezing, itching, ear ache, nasal congestion, post nasal drip,  Cardio-vascular:  No chest pain, Orthopnea, PND, anasarca, dizziness, palpitations.no Bilateral lower extremity swelling  GI:  No heartburn, indigestion, abdominal pain, nausea, vomiting, diarrhea, change in bowel habits, loss of appetite, melena, blood in stool, hematemesis Resp:  no shortness of breath at rest. No dyspnea on exertion, No excess mucus, no productive cough, No non-productive cough, No coughing up of blood.No change in color of mucus.No wheezing. Skin:  no rash or lesions. No jaundice GU:  no dysuria, change in  color of urine, no urgency or frequency. No straining to urinate.  No flank pain.  Musculoskeletal:  No joint pain or no joint swelling. No decreased range of motion. No back pain.  Psych:  No change in mood or affect. No depression or anxiety. No memory loss.  Neuro: no localizing neurological complaints, no tingling, no weakness, no double vision, no gait abnormality, no slurred speech, no confusion  As per HPI otherwise 10 point review of systems negative.   Past Medical History: Past Medical History:    Diagnosis Date  . Anemia   . Bruises easily   . Dialysis patient ([place])   . ESRD on dialysis ([place])   . Hyperlipidemia   . Hypertension   . Renal disorder    rt renal mass / < functioning of left kidney - being prepared for possible dialysis  . Right renal mass    Past Surgical History:  Procedure Laterality Date  . AV FISTULA PLACEMENT Right 07/11/2014   Procedure: ARTERIOVENOUS (AV) FISTULA CREATION RIGHT ARM BRACHIO-CEPHALIC WITH ATTEMPTED RADIO-CEPHALIC (AV) FISTULA;  Surgeon: Mal Misty, MD;  Location: [facility];  Service: Vascular;  Laterality: Right;  . [place]  . INSERTION OF DIALYSIS CATHETER Right 07/11/2014   Procedure: INSERTION OF DIALYSIS CATHETER IN RIGHT INTERNAL JUGULAR ;  Surgeon: Mal Misty, MD;  Location: [facility];  Service: Vascular;  Laterality: Right;  . LAPAROSCOPIC NEPHRECTOMY Right 07/25/2014   Procedure: RIGHT LAPAROSCOPIC RADICAL NEPHRECTOMY ;  Surgeon: Ardis Hughs, MD;  Location: [facility];  Service: Urology;  Laterality: Right;  . PATCH ANGIOPLASTY Right 07/11/2014   Procedure: PATCH ANGIOPLASTY OF RIGHT RADIAL ARTERY USING CEPHALIC VEIN.;  Surgeon: Mal Misty, MD;  Location: [facility];  Service: Vascular;  Laterality: Right;  . THROMBECTOMY W/ EMBOLECTOMY Right 07/11/2014   Procedure: THROMBECTOMY OF RIGHT RADIAL ARTERY  ;  Surgeon: Mal Misty, MD;  Location: [facility];  Service: Vascular;  Laterality: Right;     Social History:  Ambulatory   independently      reports that she has never smoked. She has never used smokeless tobacco. She reports that she drinks alcohol. She reports that she does not use drugs.  Allergies:  No Known Allergies     Family History:  Family History  Problem Relation Age of Onset  . Throat cancer Mother     smoked  . Hypertension Father     Medications: Prior to Admission medications   Medication Sig Start Date End Date Taking? Authorizing Provider  acetaminophen-codeine (TYLENOL #3)  300-30 MG tablet One every 4 hours as needed for cough 01/09/16  Yes Tanda Rockers, MD  amLODipine (NORVASC) 10 MG tablet Take 10 mg by mouth at bedtime.    Yes Historical Provider, MD  calcitRIOL (ROCALTROL) 0.25 MCG capsule Take 0.25 mcg by mouth 3 (three) times daily.    Yes Historical Provider, MD  calcium acetate (PHOSLO) 667 MG capsule Take 667-2,001 mg by mouth 3 (three) times daily with meals. Take 2001 mg three times a day with meals  Take 667 mg with snacks twice daily   Yes Historical Provider, MD  CONSTULOSE 10 GM/15ML solution Take 10 mLs by mouth daily as needed for constipation. 07/17/16  Yes Historical Provider, MD  lidocaine-prilocaine (EMLA) cream Apply 1 application topically daily.  02/09/15  Yes Historical Provider, MD  lisinopril (PRINIVIL,ZESTRIL) 20 MG tablet Take 20 mg by mouth daily.   Yes Historical Provider, MD  losartan (COZAAR)  100 MG tablet Take 100 mg by mouth daily. 07/11/16  Yes Historical Provider, MD  multivitamin (RENA-VIT) TABS tablet Take 1 tablet by mouth daily.   Yes Historical Provider, MD  SENSIPAR 60 MG tablet Take 60 mg by mouth every Monday, Wednesday, and Friday.  12/17/14  Yes Historical Provider, MD  sodium bicarbonate 650 MG tablet Take 650 mg by mouth 2 (two) times daily.   Yes Historical Provider, MD    Physical Exam: Patient Vitals for the past 24 hrs:  BP Temp Temp src Pulse Resp SpO2 Height Weight  07/29/16 1800 (!) 117/50 - - - (!) 28 - - -  07/29/16 1745 - - - - 16 - - -  07/29/16 1730 (!) 122/42 - - - 18 - - -  07/29/16 1715 - - - 93 16 99 % - -  07/29/16 1700 - - - 89 19 97 % - -  07/29/16 1645 - - - - 17 - - -  07/29/16 1630 - - - 95 19 100 % - -  07/29/16 1615 - - - - (!) 28 - - -  07/29/16 1600 117/57 - - - 13 - - -  07/29/16 1558 - - - - - - 5\' 5"  (1.651 m) 50.8 kg (112 lb)  07/29/16 1557 114/63 98.3 F (36.8 C) Oral 106 14 99 % - -  07/29/16 1552 - - - - - 100 % - -    1. General:  in No Acute distress 2. Psychological: Alert  and Oriented 3. Head/ENT:   Moist Mucous Membranes                          Head Non traumatic, neck supple                          Normal  Dentition 4. SKIN:  decreased Skin turgor,  Skin clean Dry and intact no rash 5. Heart: Regular rate and rhythm no   Murmur, Rub or gallop 6. Lungs: Clear to auscultation bilaterally, no wheezes or crackles   7. Abdomen: Soft,  non-tender, Non distended, PD catheter in place no erythema 8. Lower extremities: no clubbing, cyanosis, or edema 9. Neurologically Grossly intact, moving all 4 extremities equally   10. MSK: Normal range of motion   body mass index is 18.64 kg/m.  Labs on Admission:   Labs on Admission: I have personally reviewed following labs and imaging studies  CBC:  Recent Labs Lab 07/29/16 1725  WBC 18.9*  NEUTROABS 16.3*  HGB 4.0*  HCT 12.6*  MCV 102.4*  PLT 119   Basic Metabolic Panel:  Recent Labs Lab 07/29/16 1614  NA 142  K 5.1  CL 105  CO2 20*  GLUCOSE 106*  BUN 123*  CREATININE 11.71*  CALCIUM 7.8*   GFR: Estimated Creatinine Clearance: 4.6 mL/min (by C-G formula based on SCr of 11.71 mg/dL (H)). Liver Function Tests:  Recent Labs Lab 07/29/16 1614  AST 14*  ALT 10*  ALKPHOS 18*  BILITOT 0.5  PROT 4.4*  ALBUMIN 2.6*   No results for input(s): LIPASE, AMYLASE in the last 168 hours. No results for input(s): AMMONIA in the last 168 hours. Coagulation Profile:  Recent Labs Lab 07/29/16 1725  INR 1.14   Cardiac Enzymes: No results for input(s): CKTOTAL, CKMB, CKMBINDEX, TROPONINI in the last 168 hours. BNP (last 3 results) No results for input(s): PROBNP in the last  8760 hours. HbA1C: No results for input(s): HGBA1C in the last 72 hours. CBG: No results for input(s): GLUCAP in the last 168 hours. Lipid Profile: No results for input(s): CHOL, HDL, LDLCALC, TRIG, CHOLHDL, LDLDIRECT in the last 72 hours. Thyroid Function Tests: No results for input(s): TSH, T4TOTAL, FREET4, T3FREE,  THYROIDAB in the last 72 hours. Anemia Panel: No results for input(s): VITAMINB12, FOLATE, FERRITIN, TIBC, IRON, RETICCTPCT in the last 72 hours.  Sepsis Labs: @LABRCNTIP (procalcitonin:4,lacticidven:4) )No results found for this or any previous visit (from the past 240 hour(s)).     UA  ordered  No results found for: HGBA1C  Estimated Creatinine Clearance: 4.6 mL/min (by C-G formula based on SCr of 11.71 mg/dL (H)).  BNP (last 3 results) No results for input(s): PROBNP in the last 8760 hours.   ECG REPORT  Independently reviewed Rate:105  Rhythm:  sinus tachycardia ST&T Change: St elevation inferior leads QTC 439  Repeat HR 99 ST segment changes resolved. QTC 410  Filed Weights   07/29/16 1558  Weight: 50.8 kg (112 lb)     Cultures:    Component Value Date/Time   SDES URINE, CLEAN CATCH 03/24/2014 1245   SPECREQUEST ADD [id number] 03/24/2014 1245   CULT  03/24/2014 1245    Multiple bacterial morphotypes present, none predominant. Suggest appropriate recollection if clinically indicated. Performed at [facility] 03/26/2014 FINAL 03/24/2014 1245     Radiological Exams on Admission: Dg Chest 2 View  Result Date: 07/29/2016 CLINICAL DATA:  Central chest pain, fever and dark stools for 2 days EXAM: CHEST  2 VIEW COMPARISON:  09/23/2015 FINDINGS: The heart size and mediastinal contours are within normal limits. Both lungs are clear. The visualized skeletal structures are unremarkable. IMPRESSION: No active cardiopulmonary disease. Electronically Signed   By: Kathreen Devoid   On: 07/29/2016 18:41   Dg Knee Complete 4 Views Left  Result Date: 07/29/2016 CLINICAL DATA:  Central chest pain, fever.  Bilateral knee pain. EXAM: RIGHT KNEE - COMPLETE 4+ VIEW; LEFT KNEE - COMPLETE 4+ VIEW COMPARISON:  None. FINDINGS: RIGHT KNEE: No evidence of fracture, dislocation, or joint effusion. No evidence of arthropathy or other focal bone abnormality. Soft tissues are  unremarkable. LEFT KNEE: No evidence of fracture, dislocation, or joint effusion. No evidence of arthropathy or other focal bone abnormality. Soft tissues are unremarkable. IMPRESSION: 1.  No acute osseous injury of the right knee. 2.  No acute osseous injury of the left knee. Electronically Signed   By: Kathreen Devoid   On: 07/29/2016 18:42   Dg Knee Complete 4 Views Right  Result Date: 07/29/2016 CLINICAL DATA:  Central chest pain, fever.  Bilateral knee pain. EXAM: RIGHT KNEE - COMPLETE 4+ VIEW; LEFT KNEE - COMPLETE 4+ VIEW COMPARISON:  None. FINDINGS: RIGHT KNEE: No evidence of fracture, dislocation, or joint effusion. No evidence of arthropathy or other focal bone abnormality. Soft tissues are unremarkable. LEFT KNEE: No evidence of fracture, dislocation, or joint effusion. No evidence of arthropathy or other focal bone abnormality. Soft tissues are unremarkable. IMPRESSION: 1.  No acute osseous injury of the right knee. 2.  No acute osseous injury of the left knee. Electronically Signed   By: Kathreen Devoid   On: 07/29/2016 18:42    Chart has been reviewed    Assessment/Plan   51 y.o. 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 Cycle cardiac enzymes check echo gram. Once stable can check orthostatics prior to discharge Chest pain - with initially abnormal ECG now improved with resolution of tachycardia, will cycle CE order echo,  Will e-mail Cardiology  End-stage renal disease on PD dialysis daily - nephrology is aware we'll need to write orders for PD once up on the floor.  SIRS -  Meeting criteria with elevated WBC count RR,  tachycardia reports subjective fever -        Admit to step down obtain blood cultures, check for influenza, empirically cover to include intra-abdominal infection coverage . Symptomatic anemia - transfuse 3 units, serial CBC Gi bleed - admit to step down, serial CBC,  GI consulted Dr. Collene Mares will see patient in AM,   No hx of PUD. Given possible melena will order protonix drip.  NPO post MN, monitor for repeat bleeding.    Maintain 2 large bore IV, type and screen, transfuse 3 units.  . Diarrhea - will order gastro panel given hx of chills and fever, tarry stools possibly due to Gi blood loss . Essential hypertension  - hold home medications given possibility of SIRS and acute Gi bleed . Right renal mass - sp resection currently stable  Other plan as per orders.  DVT prophylaxis:  SCD   Code Status:  FULL CODE as per patient    Family Communication:   Family not  at  Bedside    Disposition Plan:    To home once workup is complete and patient is stable                      Consults called: Nephrology Dr. Justin Mend, Hoyt Koch  Admission status:   inpatient       Level of care       SDU      I have spent a total of 57 min on this admission    Breyer Tejera 07/29/2016, 8:51 PM    [facility]  Pager [phone]   after 2 AM please page floor coverage PA If 7AM-7PM, please contact the day team taking care of the patient  [email or web address]  Password [email or web address]

## 2016-07-29 NOTE — ED Triage Notes (Signed)
Pt brought in by [facility]. Pt c/o syncopal episode early this morning. One syncopal episodes at 0230, 0530, and at 1000. Pt c/o dizziness. Denies nausea, vomiting, and chest pain. Pt hx of kidney CA and on peritoneal dialysis. Pt completed dialysis for today. Pt denies head strike or any other injuries. A&Ox4 in NAD.

## 2016-07-29 NOTE — Progress Notes (Signed)
Pharmacy Antibiotic Note  Terry Juarez is a 51 y.o. female admitted on 07/29/2016 with sepsis.  Pharmacy has been consulted for vancomycin and zosyn dosing. Tmax is 99 and WBC is elevated at 18.9. Pt with ESRD on PD.   Plan: Vancomycin 1500mg  IV x 1 - f/u level in 3-4 days and re-dose when <20 Zosyn 3.375gm IV x 1 then 2.25gm IV Q12H F/u renal fxn, C&S, clinical status and vanc levels as needed  Height: 5\' 5"  (165.1 cm) Weight: 112 lb (50.8 kg) IBW/kg (Calculated) : 57  Temp (24hrs), Avg:98.5 F (36.9 C), Min:98.3 F (36.8 C), Max:99 F (37.2 C)   Recent Labs Lab 07/29/16 1614 07/29/16 1725  WBC  --  18.9*  CREATININE 11.71*  --     Estimated Creatinine Clearance: 4.6 mL/min (by C-G formula based on SCr of 11.71 mg/dL (H)).    No Known Allergies  Antimicrobials this admission: Vanc 1/24>> Zosyn 1/24>>  Dose adjustments this admission: N/A  Microbiology results: Pending Thank you for allowing pharmacy to be a part of this patient's care.  Amira Podolak, Rande Lawman 07/29/2016 9:06 PM

## 2016-07-29 NOTE — ED Notes (Signed)
Hospitalist at bedside. Made aware that pt temp elevated from 98.3 to 99 in first 15 min transfusion. Dr. Lenon Ahmadi to continue.

## 2016-07-29 NOTE — ED Notes (Signed)
Delos Haring, PA made aware of critically low hemoglobin

## 2016-07-29 NOTE — ED Notes (Signed)
repaged GI

## 2016-07-29 NOTE — ED Notes (Signed)
Informed consent signed by patient and at bedside.

## 2016-07-29 NOTE — Consult Note (Signed)
Referring Provider: No ref. provider found Primary Care Physician:  [name], MD Primary Nephrologist:  Dr. [name]   Reason for Consultation: Medical management of end stage renal disease patient that receives peritoneal dialysis   Anemia acute blood loss   HPI: Presented with syncope this morning at 2:30 AM and then had another episode at 5:30 and then at 10 again for the total of 3 episodes of syncope. Reports unsure if she hit her head but not sore no evidence of injury.  She is been lightheaded Reports yesterday associated chest pain while at work she checked her BP and systolic's were down in mid 90's. Chest pain has resolved.  no nausea or vomiting she has finished her peritoneal dialysis as scheduled. She has not been felling sick for the past 2 days with diarrhea with dark "tarry" stool, denies NSAID use, reports chills and low grade fever 99. She has taken some tylenol. Reports outbreak of Norovirus at work. She have had flu shot this year. No cough. Reports rigors but no body aches.   She called EMS who brought [facility]  Syncope occurred when tried to get up from the bed she had to lay down on the bathroom floor for at least 1 hour because she was so lightheaded. Also occurred one try to get up to use of bathroom. She has hx of dark stool mostly diarrhea for the past few days. Denies NSAID use She has not had any localized weakness no headache no confusion no shortness of breath   Patient is dialysis dependent x 3 years   She performs peritoneal dialysis and has had no complications of dialysis  She had nephrectomy performed for renal cell cancer 3 years ago   Past Medical History:  Diagnosis Date  . Anemia   . Bruises easily   . Dialysis patient ([place])   . ESRD on dialysis ([place])   . Hyperlipidemia   . Hypertension   . Renal disorder    rt renal mass / < functioning of left kidney - being prepared for possible dialysis  . Right renal  mass     Past Surgical History:  Procedure Laterality Date  . AV FISTULA PLACEMENT Right 07/11/2014   Procedure: ARTERIOVENOUS (AV) FISTULA CREATION RIGHT ARM BRACHIO-CEPHALIC WITH ATTEMPTED RADIO-CEPHALIC (AV) FISTULA;  Surgeon: Mal Misty, MD;  Location: [place];  Service: Vascular;  Laterality: Right;  . [place]  . INSERTION OF DIALYSIS CATHETER Right 07/11/2014   Procedure: INSERTION OF DIALYSIS CATHETER IN RIGHT INTERNAL JUGULAR ;  Surgeon: Mal Misty, MD;  Location: [place];  Service: Vascular;  Laterality: Right;  . LAPAROSCOPIC NEPHRECTOMY Right 07/25/2014   Procedure: RIGHT LAPAROSCOPIC RADICAL NEPHRECTOMY ;  Surgeon: Ardis Hughs, MD;  Location: [facility];  Service: Urology;  Laterality: Right;  . PATCH ANGIOPLASTY Right 07/11/2014   Procedure: PATCH ANGIOPLASTY OF RIGHT RADIAL ARTERY USING CEPHALIC VEIN.;  Surgeon: Mal Misty, MD;  Location: [place];  Service: Vascular;  Laterality: Right;  . THROMBECTOMY W/ EMBOLECTOMY Right 07/11/2014   Procedure: THROMBECTOMY OF RIGHT RADIAL ARTERY  ;  Surgeon: Mal Misty, MD;  Location: [place];  Service: Vascular;  Laterality: Right;    Prior to Admission medications   Medication Sig Start Date End Date Taking? Authorizing Provider  acetaminophen-codeine (TYLENOL #3) 300-30 MG tablet One every 4 hours as needed for cough 01/09/16  Yes Tanda Rockers, MD  amLODipine (NORVASC) 10 MG  tablet Take 10 mg by mouth at bedtime.    Yes Historical Provider, MD  calcitRIOL (ROCALTROL) 0.25 MCG capsule Take 0.25 mcg by mouth 3 (three) times daily.    Yes Historical Provider, MD  calcium acetate (PHOSLO) 667 MG capsule Take 667-2,001 mg by mouth 3 (three) times daily with meals. Take 2001 mg three times a day with meals  Take 667 mg with snacks twice daily   Yes Historical Provider, MD  CONSTULOSE 10 GM/15ML solution Take 10 mLs by mouth daily as needed for constipation. 07/17/16  Yes Historical Provider, MD  lidocaine-prilocaine  (EMLA) cream Apply 1 application topically daily.  02/09/15  Yes Historical Provider, MD  lisinopril (PRINIVIL,ZESTRIL) 20 MG tablet Take 20 mg by mouth daily.   Yes Historical Provider, MD  losartan (COZAAR) 100 MG tablet Take 100 mg by mouth daily. 07/11/16  Yes Historical Provider, MD  multivitamin (RENA-VIT) TABS tablet Take 1 tablet by mouth daily.   Yes Historical Provider, MD  SENSIPAR 60 MG tablet Take 60 mg by mouth every Monday, Wednesday, and Friday.  12/17/14  Yes Historical Provider, MD  sodium bicarbonate 650 MG tablet Take 650 mg by mouth 2 (two) times daily.   Yes Historical Provider, MD    Current Facility-Administered Medications  Medication Dose Route Frequency Provider Last Rate Last Dose  . 0.9 %  sodium chloride infusion  10 mL/hr Intravenous Once Delos Haring, PA-C      . pantoprazole (PROTONIX) 80 mg in sodium chloride 0.9 % 100 mL IVPB  80 mg Intravenous Once Toy Baker, MD 300 mL/hr at 07/29/16 2103 80 mg at 07/29/16 2103  . pantoprazole (PROTONIX) 80 mg in sodium chloride 0.9 % 250 mL (0.32 mg/mL) infusion  8 mg/hr Intravenous Continuous Toy Baker, MD      . Derrill Memo ON 08/02/2016] pantoprazole (PROTONIX) injection 40 mg  40 mg Intravenous Q12H Toy Baker, MD      . Derrill Memo ON 07/30/2016] piperacillin-tazobactam (ZOSYN) IVPB 2.25 g  2.25 g Intravenous Q12H Rachel L Rumbarger, RPH      . piperacillin-tazobactam (ZOSYN) IVPB 3.375 g  3.375 g Intravenous Once Para March, RPH      . vancomycin (VANCOCIN) 1,500 mg in sodium chloride 0.9 % 500 mL IVPB  1,500 mg Intravenous Once Para March, [facility]       Current Outpatient Prescriptions  Medication Sig Dispense Refill  . acetaminophen-codeine (TYLENOL #3) 300-30 MG tablet One every 4 hours as needed for cough 40 tablet 0  . amLODipine (NORVASC) 10 MG tablet Take 10 mg by mouth at bedtime.     . calcitRIOL (ROCALTROL) 0.25 MCG capsule Take 0.25 mcg by mouth 3 (three) times daily.     . calcium  acetate (PHOSLO) 667 MG capsule Take 667-2,001 mg by mouth 3 (three) times daily with meals. Take 2001 mg three times a day with meals  Take 667 mg with snacks twice daily    . CONSTULOSE 10 GM/15ML solution Take 10 mLs by mouth daily as needed for constipation.  0  . lidocaine-prilocaine (EMLA) cream Apply 1 application topically daily.     Marland Kitchen lisinopril (PRINIVIL,ZESTRIL) 20 MG tablet Take 20 mg by mouth daily.    Marland Kitchen losartan (COZAAR) 100 MG tablet Take 100 mg by mouth daily.  2  . multivitamin (RENA-VIT) TABS tablet Take 1 tablet by mouth daily.    . SENSIPAR 60 MG tablet Take 60 mg by mouth every Monday, Wednesday, and Friday.     Marland Kitchen  sodium bicarbonate 650 MG tablet Take 650 mg by mouth 2 (two) times daily.      Allergies as of 07/29/2016  . (No Known Allergies)    Family History  Problem Relation Age of Onset  . Throat cancer Mother     smoked  . Hypertension Father     Social History   Social History  . Marital status: Widowed    Spouse name: N/A  . Number of children: N/A  . Years of education: N/A   Occupational History  . Not on file.   Social History Main Topics  . Smoking status: Never Smoker  . Smokeless tobacco: Never Used  . Alcohol use 0.0 oz/week     Comment: occ  . Drug use: No  . Sexual activity: Yes   Other Topics Concern  . Not on file   Social History Narrative  . No narrative on file    Review of Systems: Gen: Denies any fever, chills, sweats, anorexia, fatigue, weakness, malaise, weight loss, and sleep disorder HEENT: No visual complaints, No history of Retinopathy. Normal external appearance No Epistaxis or Sore throat. No sinusitis.   CV: Denies chest pain, angina, palpitations, syncope, orthopnea, PND, peripheral edema, and claudication. Resp: Denies dyspnea at rest, dyspnea with exercise, cough, sputum, wheezing, coughing up blood, and pleurisy. GI:  As per HPI GU : Denies urinary burning, blood in urine, urinary frequency, urinary  hesitancy, nocturnal urination, and urinary incontinence.  No renal calculi. MS: Denies joint pain, limitation of movement, and swelling, stiffness, low back pain, extremity pain. Denies muscle weakness, cramps, atrophy.  No use of non steroidal antiinflammatory drugs. Derm: Denies rash, itching, dry skin, hives, moles, warts, or unhealing ulcers.  Psych: Denies depression, anxiety, memory loss, suicidal ideation, hallucinations, paranoia, and confusion. Heme: Denies bruising, bleeding, and enlarged lymph nodes. Neuro: No headache.  No diplopia. No dysarthria.  No dysphasia.  No history of CVA.  No Seizures. No paresthesias.  No weakness. Endocrine No DM.  No Thyroid disease.  No Adrenal disease.  Physical Exam: Vital signs in last 24 hours: Temp:  [98.3 F (36.8 C)-99 F (37.2 C)] 99 F (37.2 C) (01/24 1953) Pulse Rate:  [89-106] 103 (01/24 2030) Resp:  [12-28] 14 (01/24 2030) BP: (114-139)/(42-63) 139/53 (01/24 2030) SpO2:  [97 %-100 %] 100 % (01/24 2030) Weight:  [50.8 kg (112 lb)] 50.8 kg (112 lb) (01/24 1558)   General:   Alert,  Well-developed, well-nourished, pleasant and cooperative in NAD Head:  Normocephalic and atraumatic. Eyes:  Sclera clear, no icterus.   Conjunctiva pale  Ears:  Normal auditory acuity. Nose:  No deformity, discharge,  or lesions. Mouth:  No deformity or lesions, dentition normal. Neck:  Supple; no masses or thyromegaly. JVP not elevated Lungs:  Clear throughout to auscultation.   No wheezes, crackles, or rhonchi. No acute distress. Heart:  Regular rate and rhythm; no murmurs, clicks, rubs,  or gallops. Abdomen:  Soft, nontender and nondistended. No masses, hepatosplenomegaly or hernias noted. Normal bowel sounds, without guarding, and without rebound.   Msk:  Symmetrical without gross deformities. Normal posture. Pulses:  No carotid, renal, femoral bruits. DP and PT symmetrical and equal Extremities:  Without clubbing or edema. Neurologic:  Alert and   oriented x4;  grossly normal neurologically. Skin:  Intact without significant lesions or rashes. Cervical Nodes:  No significant cervical adenopathy. Psych:  Alert and cooperative. Normal mood and affect.  Intake/Output from previous day: No intake/output data recorded. Intake/Output this shift: No  intake/output data recorded.  Lab Results:  Recent Labs  07/29/16 1725  WBC 18.9*  HGB 4.0*  HCT 12.6*  PLT 244   BMET  Recent Labs  07/29/16 1614  NA 142  K 5.1  CL 105  CO2 20*  GLUCOSE 106*  BUN 123*  CREATININE 11.71*  CALCIUM 7.8*   LFT  Recent Labs  07/29/16 1614  PROT 4.4*  ALBUMIN 2.6*  AST 14*  ALT 10*  ALKPHOS 18*  BILITOT 0.5   PT/INR  Recent Labs  07/29/16 1725  LABPROT 14.6  INR 1.14   Hepatitis Panel No results for input(s): HEPBSAG, HCVAB, HEPAIGM, HEPBIGM in the last 72 hours.  Studies/Results: Dg Chest 2 View  Result Date: 07/29/2016 CLINICAL DATA:  Central chest pain, fever and dark stools for 2 days EXAM: CHEST  2 VIEW COMPARISON:  09/23/2015 FINDINGS: The heart size and mediastinal contours are within normal limits. Both lungs are clear. The visualized skeletal structures are unremarkable. IMPRESSION: No active cardiopulmonary disease. Electronically Signed   By: Kathreen Devoid   On: 07/29/2016 18:41   Dg Knee Complete 4 Views Left  Result Date: 07/29/2016 CLINICAL DATA:  Central chest pain, fever.  Bilateral knee pain. EXAM: RIGHT KNEE - COMPLETE 4+ VIEW; LEFT KNEE - COMPLETE 4+ VIEW COMPARISON:  None. FINDINGS: RIGHT KNEE: No evidence of fracture, dislocation, or joint effusion. No evidence of arthropathy or other focal bone abnormality. Soft tissues are unremarkable. LEFT KNEE: No evidence of fracture, dislocation, or joint effusion. No evidence of arthropathy or other focal bone abnormality. Soft tissues are unremarkable. IMPRESSION: 1.  No acute osseous injury of the right knee. 2.  No acute osseous injury of the left knee.  Electronically Signed   By: Kathreen Devoid   On: 07/29/2016 18:42   Dg Knee Complete 4 Views Right  Result Date: 07/29/2016 CLINICAL DATA:  Central chest pain, fever.  Bilateral knee pain. EXAM: RIGHT KNEE - COMPLETE 4+ VIEW; LEFT KNEE - COMPLETE 4+ VIEW COMPARISON:  None. FINDINGS: RIGHT KNEE: No evidence of fracture, dislocation, or joint effusion. No evidence of arthropathy or other focal bone abnormality. Soft tissues are unremarkable. LEFT KNEE: No evidence of fracture, dislocation, or joint effusion. No evidence of arthropathy or other focal bone abnormality. Soft tissues are unremarkable. IMPRESSION: 1.  No acute osseous injury of the right knee. 2.  No acute osseous injury of the left knee. Electronically Signed   By: Kathreen Devoid   On: 07/29/2016 18:42    Assessment/Plan:  End Stage Renal Disease  Patient is dialysis dependent and performs PD   She has completed her treatments today and uses a cycler  She does 5 exchanges with a 2 L volume and 1.5 % solution  HTN/Vol  Actually appears euvolemic  Anemia  GI blood loss  --- painless blood loss per rectum  Sounds melena stool  Bones calcium 7.8 but corrects to normal range -- uses sensipar ( will hold sensipar)   GI blood loss  Avoid anticoagulants, protonix and GI consultation    LOS: 0 Inocencia Murtaugh W @TODAY @9 :05 PM

## 2016-07-29 NOTE — ED Notes (Signed)
Spoke with Dr. Roel Cluck regarding antibiotics. Ok to hold until blood transfusion has finished.

## 2016-07-29 NOTE — ED Provider Notes (Signed)
[facility] DEPT Provider Note   CSN: [id number] Arrival date & time: 07/29/16  1552  History   Chief Complaint Chief Complaint  Patient presents with  . Loss of Consciousness   HPI Terry Juarez is a 51 y.o. female.  HPI   Pt has PMH of hypertension, anemia, ESRD on peritoneal dialysis,  Right renal mass comes to the ER after having 3 syncopal episodes today. The first one was when she got out of bed to use the restroom around 2:30am, reports not making it to use restroom and waking up on bathroom floor and needing to stay there for 1 hour before getting up and going to her bed. Got up a second time to the use the restroom and had a second syncopal episode. Decided to call EMS at that time, her 3rd episode was after she left them in and walked into her room she reports passing out on the floor. Is not sure if she hit her head or not. She is not having any pain. Patient says she feels normal as of right now. No headache, no LE swelling, no pain, no weakness, no confusion, no change in vision, no SOB or CP. She did have some CP yesterday.  Past Medical History:  Diagnosis Date  . Anemia   . Bruises easily   . Dialysis patient ([place])   . ESRD on dialysis ([place])   . Hyperlipidemia   . Hypertension   . Renal disorder    rt renal mass / < functioning of left kidney - being prepared for possible dialysis  . Right renal mass     Patient Active Problem List   Diagnosis Date Noted  . Symptomatic anemia 07/29/2016  . Cough 01/09/2016  . Elevated troponin 02/12/2015  . Headache 02/12/2015  . Syncope 02/12/2015  . ESRD on dialysis ([place]) 02/12/2015  . Hypertension   . Hyperlipidemia   . Anemia   . Essential hypertension   . Cephalalgia   . Right renal mass 07/25/2014    Past Surgical History:  Procedure Laterality Date  . AV FISTULA PLACEMENT Right 07/11/2014   Procedure: ARTERIOVENOUS (AV) FISTULA CREATION RIGHT ARM BRACHIO-CEPHALIC WITH ATTEMPTED RADIO-CEPHALIC (AV) FISTULA;   Surgeon: Mal Misty, MD;  Location: [facility];  Service: Vascular;  Laterality: Right;  . [place]  . INSERTION OF DIALYSIS CATHETER Right 07/11/2014   Procedure: INSERTION OF DIALYSIS CATHETER IN RIGHT INTERNAL JUGULAR ;  Surgeon: Mal Misty, MD;  Location: [facility];  Service: Vascular;  Laterality: Right;  . LAPAROSCOPIC NEPHRECTOMY Right 07/25/2014   Procedure: RIGHT LAPAROSCOPIC RADICAL NEPHRECTOMY ;  Surgeon: Ardis Hughs, MD;  Location: [facility];  Service: Urology;  Laterality: Right;  . PATCH ANGIOPLASTY Right 07/11/2014   Procedure: PATCH ANGIOPLASTY OF RIGHT RADIAL ARTERY USING CEPHALIC VEIN.;  Surgeon: Mal Misty, MD;  Location: [facility];  Service: Vascular;  Laterality: Right;  . THROMBECTOMY W/ EMBOLECTOMY Right 07/11/2014   Procedure: THROMBECTOMY OF RIGHT RADIAL ARTERY  ;  Surgeon: Mal Misty, MD;  Location: [facility];  Service: Vascular;  Laterality: Right;    OB History    No data available       Home Medications    Prior to Admission medications   Medication Sig Start Date End Date Taking? Authorizing Provider  acetaminophen-codeine (TYLENOL #3) 300-30 MG tablet One every 4 hours as needed for cough 01/09/16  Yes Tanda Rockers, MD  amLODipine (NORVASC) 10 MG tablet Take 10 mg  by mouth at bedtime.    Yes Historical Provider, MD  calcitRIOL (ROCALTROL) 0.25 MCG capsule Take 0.25 mcg by mouth 3 (three) times daily.    Yes Historical Provider, MD  calcium acetate (PHOSLO) 667 MG capsule Take 667-2,001 mg by mouth 3 (three) times daily with meals. Take 2001 mg three times a day with meals  Take 667 mg with snacks twice daily   Yes Historical Provider, MD  CONSTULOSE 10 GM/15ML solution Take 10 mLs by mouth daily as needed for constipation. 07/17/16  Yes Historical Provider, MD  lidocaine-prilocaine (EMLA) cream Apply 1 application topically daily.  02/09/15  Yes Historical Provider, MD  lisinopril (PRINIVIL,ZESTRIL) 20 MG tablet Take 20 mg by mouth daily.    Yes Historical Provider, MD  losartan (COZAAR) 100 MG tablet Take 100 mg by mouth daily. 07/11/16  Yes Historical Provider, MD  multivitamin (RENA-VIT) TABS tablet Take 1 tablet by mouth daily.   Yes Historical Provider, MD  SENSIPAR 60 MG tablet Take 60 mg by mouth every Monday, Wednesday, and Friday.  12/17/14  Yes Historical Provider, MD  sodium bicarbonate 650 MG tablet Take 650 mg by mouth 2 (two) times daily.   Yes Historical Provider, MD    Family History Family History  Problem Relation Age of Onset  . Throat cancer Mother     smoked  . Hypertension Father     Social History Social History  Substance Use Topics  . Smoking status: Never Smoker  . Smokeless tobacco: Never Used  . Alcohol use 0.0 oz/week     Comment: occ     Allergies   Patient has no known allergies.   Review of Systems Review of Systems Review of Systems All other systems negative except as documented in the HPI. All pertinent positives and negatives as reviewed in the HPI.   Physical Exam Updated Vital Signs BP (!) 139/53   Pulse 103   Temp 99 F (37.2 C) (Oral)   Resp 14   Ht 5\' 5"  (1.651 m)   Wt 50.8 kg   SpO2 100%   BMI 18.64 kg/m   Physical Exam  Constitutional: She appears well-developed and well-nourished.  HENT:  Head: Normocephalic and atraumatic. Head is without raccoon's eyes, without Battle's sign and without laceration.  Eyes: Conjunctivae are normal. Pupils are equal, round, and reactive to light.  Neck: Trachea normal, normal range of motion and full passive range of motion without pain. Neck supple.  Cardiovascular: Regular rhythm and normal pulses.  Tachycardia present.   Pulmonary/Chest: Effort normal and breath sounds normal. Chest wall is not dull to percussion. She exhibits no tenderness, no crepitus, no edema, no deformity and no retraction.  Abdominal: Soft. Normal appearance and bowel sounds are normal.  Peritoneal graft in place and looks well kept.  Genitourinary:  Rectal exam shows guaiac positive stool.  Genitourinary Comments: Melena  Musculoskeletal: Normal range of motion.  No le swelling  Neurological: She is alert. She has normal strength.  + tremor present  Skin: Skin is warm, dry and intact.  Psychiatric: She has a normal mood and affect. Her speech is normal and behavior is normal. Judgment and thought content normal. Cognition and memory are normal.    ED Treatments / Results  Labs (all labs ordered are listed, but only abnormal results are displayed) Labs Reviewed  COMPREHENSIVE METABOLIC PANEL - Abnormal; Notable for the following:       Result Value   CO2 20 (*)    Glucose, Bld  106 (*)    BUN 123 (*)    Creatinine, Ser 11.71 (*)    Calcium 7.8 (*)    Total Protein 4.4 (*)    Albumin 2.6 (*)    AST 14 (*)    ALT 10 (*)    Alkaline Phosphatase 18 (*)    GFR calc non Af Amer 3 (*)    GFR calc Af Amer 4 (*)    Anion gap 17 (*)    All other components within normal limits  CBC WITH DIFFERENTIAL/PLATELET - Abnormal; Notable for the following:    WBC 18.9 (*)    RBC 1.23 (*)    Hemoglobin 4.0 (*)    HCT 12.6 (*)    MCV 102.4 (*)    Neutro Abs 16.3 (*)    Monocytes Absolute 1.1 (*)    All other components within normal limits  POC OCCULT BLOOD, ED - Abnormal; Notable for the following:    Fecal Occult Bld POSITIVE (*)    All other components within normal limits  CULTURE, BLOOD (ROUTINE X 2)  CULTURE, BLOOD (ROUTINE X 2)  PROTIME-INR  URINALYSIS, ROUTINE W REFLEX MICROSCOPIC  CBC WITH DIFFERENTIAL/PLATELET  OCCULT BLOOD X 1 CARD TO LAB, STOOL  OCCULT BLOOD X 1 CARD TO LAB, STOOL  TROPONIN I  TROPONIN I  TROPONIN I  LACTIC ACID, PLASMA  LACTIC ACID, PLASMA  PROCALCITONIN  TYPE AND SCREEN  PREPARE RBC (CROSSMATCH)  ABO/RH    EKG  EKG Interpretation None       Radiology Dg Chest 2 View  Result Date: 07/29/2016 CLINICAL DATA:  Central chest pain, fever and dark stools for 2 days EXAM: CHEST  2 VIEW  COMPARISON:  09/23/2015 FINDINGS: The heart size and mediastinal contours are within normal limits. Both lungs are clear. The visualized skeletal structures are unremarkable. IMPRESSION: No active cardiopulmonary disease. Electronically Signed   By: Kathreen Devoid   On: 07/29/2016 18:41   Dg Knee Complete 4 Views Left  Result Date: 07/29/2016 CLINICAL DATA:  Central chest pain, fever.  Bilateral knee pain. EXAM: RIGHT KNEE - COMPLETE 4+ VIEW; LEFT KNEE - COMPLETE 4+ VIEW COMPARISON:  None. FINDINGS: RIGHT KNEE: No evidence of fracture, dislocation, or joint effusion. No evidence of arthropathy or other focal bone abnormality. Soft tissues are unremarkable. LEFT KNEE: No evidence of fracture, dislocation, or joint effusion. No evidence of arthropathy or other focal bone abnormality. Soft tissues are unremarkable. IMPRESSION: 1.  No acute osseous injury of the right knee. 2.  No acute osseous injury of the left knee. Electronically Signed   By: Kathreen Devoid   On: 07/29/2016 18:42   Dg Knee Complete 4 Views Right  Result Date: 07/29/2016 CLINICAL DATA:  Central chest pain, fever.  Bilateral knee pain. EXAM: RIGHT KNEE - COMPLETE 4+ VIEW; LEFT KNEE - COMPLETE 4+ VIEW COMPARISON:  None. FINDINGS: RIGHT KNEE: No evidence of fracture, dislocation, or joint effusion. No evidence of arthropathy or other focal bone abnormality. Soft tissues are unremarkable. LEFT KNEE: No evidence of fracture, dislocation, or joint effusion. No evidence of arthropathy or other focal bone abnormality. Soft tissues are unremarkable. IMPRESSION: 1.  No acute osseous injury of the right knee. 2.  No acute osseous injury of the left knee. Electronically Signed   By: Kathreen Devoid   On: 07/29/2016 18:42    Procedures Procedures (including critical care time)  Medications Ordered in ED Medications  0.9 %  sodium chloride infusion (not administered)  pantoprazole (  PROTONIX) 80 mg in sodium chloride 0.9 % 100 mL IVPB (not administered)    pantoprazole (PROTONIX) 80 mg in sodium chloride 0.9 % 250 mL (0.32 mg/mL) infusion (not administered)  pantoprazole (PROTONIX) injection 40 mg (not administered)     Initial Impression / Assessment and Plan / ED Course  I have reviewed the triage vital signs and the nursing notes.  Pertinent labs & imaging results that were available during my care of the patient were reviewed by me and considered in my medical decision making (see chart for details).  CRITICAL CARE Performed by: Linus Mako Total critical care time: 60 minutes Critical care time was exclusive of separately billable procedures and treating other patients. Critical care was necessary to treat or prevent imminent or life-threatening deterioration. Critical care was time spent personally by me on the following activities: development of treatment plan with patient and/or surrogate as well as nursing, discussions with consultants, evaluation of patient's response to treatment, examination of patient, obtaining history from patient or surrogate, ordering and performing treatments and interventions, ordering and review of laboratory studies, ordering and review of radiographic studies, pulse oximetry and re-evaluation of patient's condition.   Patient has a critically low hemoglobin of 4.0. Her conjunctiva is pale, her creatinine and renal function is worse than usual. She is 12 at baseline but reports dropping down to 8 on occasion and requiring a "shot" given to her by nephrology as she is on the transplant list. I spoke with her nurse and they deny that she has ever been as low as 4. The patient is agreeable to receiving blood transfusion. She continues to be hemodynamically stable. She endorses two days of dark diarrhea. No abdominal pain. No brb, not actively bleeding from anywhere.  8:02 pm I spoke with Dr. Justin Mend with Nephrology, he requests [place] and the nurse to call him when she gets there and he will put in orders  for peritoneal dialysis.  9:01 pm  Gastroenterology has been consulted and spoke with [facility] who will be involved with her care.  Blood pressure (!) 139/53, pulse 103, temperature 99 F (37.2 C), temperature source Oral, resp. rate 14, height 5\' 5"  (1.651 m), weight 50.8 kg, SpO2 100 %.   Final Clinical Impressions(s) / ED Diagnoses   Final diagnoses:  Syncope, unspecified syncope type  Anemia requiring transfusions    New Prescriptions New Prescriptions   No medications on file     Delos Haring, Hershal Coria 07/29/16 2102    Fredia Sorrow, MD 08/03/16 1752

## 2016-07-30 ENCOUNTER — Encounter (HOSPITAL_COMMUNITY)
Admission: EM | Disposition: A | Discharge status: Home / Self Care | Payer: Self-pay | Source: Home / Self Care | Attending: Internal Medicine

## 2016-07-30 ENCOUNTER — Inpatient Hospital Stay (HOSPITAL_COMMUNITY): Payer: BLUE CROSS/BLUE SHIELD

## 2016-07-30 DIAGNOSIS — I10 Essential (primary) hypertension: Secondary | ICD-10-CM

## 2016-07-30 DIAGNOSIS — D62 Acute posthemorrhagic anemia: Principal | ICD-10-CM

## 2016-07-30 DIAGNOSIS — K922 Gastrointestinal hemorrhage, unspecified: Secondary | ICD-10-CM

## 2016-07-30 DIAGNOSIS — Z992 Dependence on renal dialysis: Secondary | ICD-10-CM

## 2016-07-30 DIAGNOSIS — N186 End stage renal disease: Secondary | ICD-10-CM

## 2016-07-30 HISTORY — PX: ESOPHAGOGASTRODUODENOSCOPY: SHX5428

## 2016-07-30 LAB — CBC
HCT: 23.9 % — ABNORMAL LOW (ref 36.0–46.0)
HEMATOCRIT: 24.9 % — AB (ref 36.0–46.0)
HEMATOCRIT: 25.9 % — AB (ref 36.0–46.0)
HEMATOCRIT: 25.5 % — AB (ref 36.0–46.0)
HEMOGLOBIN: 8.6 g/dL — AB (ref 12.0–15.0)
HEMOGLOBIN: 8.3 g/dL — AB (ref 12.0–15.0)
Hemoglobin: 7.9 g/dL — ABNORMAL LOW (ref 12.0–15.0)
Hemoglobin: 8.3 g/dL — ABNORMAL LOW (ref 12.0–15.0)
MCH: 30.9 pg (ref 26.0–34.0)
MCH: 30.7 pg (ref 26.0–34.0)
MCH: 30.7 pg (ref 26.0–34.0)
MCH: 30.4 pg (ref 26.0–34.0)
MCHC: 33.1 g/dL (ref 30.0–36.0)
MCHC: 33.3 g/dL (ref 30.0–36.0)
MCHC: 33.2 g/dL (ref 30.0–36.0)
MCHC: 32.5 g/dL (ref 30.0–36.0)
MCV: 93.4 fL (ref 78.0–100.0)
MCV: 92.2 fL (ref 78.0–100.0)
MCV: 92.5 fL (ref 78.0–100.0)
MCV: 93.4 fL (ref 78.0–100.0)
PLATELETS: 139 10*3/uL — AB (ref 150–400)
Platelets: 168 10*3/uL (ref 150–400)
Platelets: 160 10*3/uL (ref 150–400)
Platelets: 161 10*3/uL (ref 150–400)
RBC: 2.56 MIL/uL — AB (ref 3.87–5.11)
RBC: 2.7 MIL/uL — ABNORMAL LOW (ref 3.87–5.11)
RBC: 2.8 MIL/uL — ABNORMAL LOW (ref 3.87–5.11)
RBC: 2.73 MIL/uL — AB (ref 3.87–5.11)
RDW: 16 % — ABNORMAL HIGH (ref 11.5–15.5)
RDW: 16.1 % — AB (ref 11.5–15.5)
RDW: 16.4 % — ABNORMAL HIGH (ref 11.5–15.5)
RDW: 16.7 % — ABNORMAL HIGH (ref 11.5–15.5)
WBC: 11.6 10*3/uL — AB (ref 4.0–10.5)
WBC: 9.7 10*3/uL (ref 4.0–10.5)
WBC: 10.9 10*3/uL — AB (ref 4.0–10.5)
WBC: 10 10*3/uL (ref 4.0–10.5)

## 2016-07-30 LAB — COMPREHENSIVE METABOLIC PANEL
ALT: 9 U/L — AB (ref 14–54)
AST: 14 U/L — ABNORMAL LOW (ref 15–41)
Albumin: 2.3 g/dL — ABNORMAL LOW (ref 3.5–5.0)
Alkaline Phosphatase: 15 U/L — ABNORMAL LOW (ref 38–126)
Anion gap: 17 — ABNORMAL HIGH (ref 5–15)
BILIRUBIN TOTAL: 0.5 mg/dL (ref 0.3–1.2)
BUN: 114 mg/dL — AB (ref 6–20)
CHLORIDE: 105 mmol/L (ref 101–111)
CO2: 17 mmol/L — ABNORMAL LOW (ref 22–32)
CREATININE: 11.46 mg/dL — AB (ref 0.44–1.00)
Calcium: 7.5 mg/dL — ABNORMAL LOW (ref 8.9–10.3)
GFR, EST AFRICAN AMERICAN: 4 mL/min — AB (ref >60)
GFR, EST NON AFRICAN AMERICAN: 3 mL/min — AB (ref >60)
Glucose, Bld: 104 mg/dL — ABNORMAL HIGH (ref 65–99)
Potassium: 4.5 mmol/L (ref 3.5–5.1)
Sodium: 139 mmol/L (ref 135–145)
TOTAL PROTEIN: 4.1 g/dL — AB (ref 6.5–8.1)

## 2016-07-30 LAB — TSH: TSH: 0.549 u[IU]/mL (ref 0.350–4.500)

## 2016-07-30 LAB — TROPONIN I

## 2016-07-30 LAB — OCCULT BLOOD X 1 CARD TO LAB, STOOL: FECAL OCCULT BLD: POSITIVE — AB

## 2016-07-30 LAB — GLUCOSE, CAPILLARY: GLUCOSE-CAPILLARY: 117 mg/dL — AB (ref 65–99)

## 2016-07-30 LAB — ECHOCARDIOGRAM COMPLETE
HEIGHTINCHES: 65 in
WEIGHTICAEL: 1820.12 [oz_av]

## 2016-07-30 LAB — INFLUENZA PANEL BY PCR (TYPE A & B)
INFLAPCR: NEGATIVE
INFLBPCR: NEGATIVE

## 2016-07-30 LAB — MAGNESIUM: Magnesium: 2.1 mg/dL (ref 1.7–2.4)

## 2016-07-30 LAB — LACTIC ACID, PLASMA
LACTIC ACID, VENOUS: 1.4 mmol/L (ref 0.5–1.9)
Lactic Acid, Venous: 1.6 mmol/L (ref 0.5–1.9)

## 2016-07-30 LAB — PHOSPHORUS: PHOSPHORUS: 5.9 mg/dL — AB (ref 2.5–4.6)

## 2016-07-30 LAB — MRSA PCR SCREENING: MRSA BY PCR: NEGATIVE

## 2016-07-30 SURGERY — EGD (ESOPHAGOGASTRODUODENOSCOPY)
Anesthesia: Moderate Sedation

## 2016-07-30 MED ORDER — HEPARIN 1000 UNIT/ML FOR PERITONEAL DIALYSIS
2500.0000 [IU] | INTRAMUSCULAR | Status: DC | PRN
  Filled 2016-07-30: qty 2.5

## 2016-07-30 MED ORDER — VANCOMYCIN HCL 10 G IV SOLR
1500.0000 mg | Freq: Once | INTRAVENOUS | Status: AC
  Administered 2016-07-30: 1500 mg via INTRAVENOUS
  Filled 2016-07-30: qty 1500

## 2016-07-30 MED ORDER — PIPERACILLIN-TAZOBACTAM IN DEX 2-0.25 GM/50ML IV SOLN
2.2500 g | Freq: Two times a day (BID) | INTRAVENOUS | Status: DC
  Administered 2016-07-30 – 2016-07-31 (×2): 2.25 g via INTRAVENOUS
  Filled 2016-07-30 (×4): qty 50

## 2016-07-30 MED ORDER — FENTANYL CITRATE (PF) 100 MCG/2ML IJ SOLN
INTRAMUSCULAR | Status: DC | PRN
  Administered 2016-07-30: 25 ug via INTRAVENOUS

## 2016-07-30 MED ORDER — DIPHENHYDRAMINE HCL 50 MG/ML IJ SOLN
INTRAMUSCULAR | Status: AC
  Filled 2016-07-30: qty 1

## 2016-07-30 MED ORDER — MIDAZOLAM HCL 10 MG/2ML IJ SOLN
INTRAMUSCULAR | Status: DC | PRN
  Administered 2016-07-30: 2 mg via INTRAVENOUS
  Administered 2016-07-30: 1 mg via INTRAVENOUS

## 2016-07-30 MED ORDER — PIPERACILLIN-TAZOBACTAM 3.375 G IVPB 30 MIN
3.3750 g | Freq: Once | INTRAVENOUS | Status: AC
  Administered 2016-07-30: 3.375 g via INTRAVENOUS
  Filled 2016-07-30: qty 50

## 2016-07-30 MED ORDER — GENTAMICIN SULFATE 0.1 % EX CREA
1.0000 "application " | TOPICAL_CREAM | Freq: Every day | CUTANEOUS | Status: DC
  Administered 2016-07-30 – 2016-08-02 (×5): 1 via TOPICAL
  Filled 2016-07-30 (×2): qty 15

## 2016-07-30 MED ORDER — SODIUM CHLORIDE 0.9 % IV SOLN
INTRAVENOUS | Status: DC
  Administered 2016-07-31: 06:00:00 via INTRAVENOUS

## 2016-07-30 MED ORDER — MIDAZOLAM HCL 5 MG/ML IJ SOLN
INTRAMUSCULAR | Status: AC
  Filled 2016-07-30: qty 3

## 2016-07-30 MED ORDER — DELFLEX-LC/1.5% DEXTROSE 344 MOSM/L IP SOLN
10.0000 L | INTRAPERITONEAL | Status: DC
  Administered 2016-07-31: 10 L via INTRAPERITONEAL
  Administered 2016-08-01: 10000 mL via INTRAPERITONEAL

## 2016-07-30 MED ORDER — FENTANYL CITRATE (PF) 100 MCG/2ML IJ SOLN
INTRAMUSCULAR | Status: AC
Start: 2016-07-30 — End: 2016-07-30
  Filled 2016-07-30: qty 4

## 2016-07-30 MED ORDER — DIPHENHYDRAMINE HCL 50 MG/ML IJ SOLN
INTRAMUSCULAR | Status: DC | PRN
  Administered 2016-07-30: 12.5 mg via INTRAVENOUS

## 2016-07-30 NOTE — Progress Notes (Signed)
[facility] PROGRESS NOTE  Terry Juarez AOZ:[id number] DOB: 1965-10-02 DOA: 07/29/2016  PCP: [name], MD  Brief History/Interval Summary: 51 year old female with a past medical history of end-stage renal disease on peritoneal dialysis, hypertension, anemia, history of renal cell carcinoma status post nephrectomy, who presented after sustaining multiple episodes of syncope. Patient had been sick for the past few days or with diarrhea consisting of dark tarry stools. Patient was found to be significantly anemic with a hemoglobin of 4. She had heme positive stool. She was hospitalized for further management. Should there was also reports of fever, chills.  Reason for Visit: Blood loss anemia. GI bleed.  Consultants: Gastroenterology. Nephrology.  Procedures:  Her toenail dialysis  Transthoracic echocardiogram is pending  Antibiotics: Vancomycin and Zosyn  Subjective/Interval History: Patient feels slightly better this morning. Still feels fatigued. Hasn't had any further episodes of diarrhea. Denies any nausea, vomiting.  ROS: Denies any chest pain or shortness of breath  Objective:  Vital Signs  Vitals:   07/30/16 0726 07/30/16 0800 07/30/16 0900 07/30/16 1000  BP:  (!) 143/63 (!) 144/55 127/82  Pulse:  81 78 73  Resp:  13 11 14   Temp: 98.2 F (36.8 C)     TempSrc: Oral     SpO2:  100% 99% 100%  Weight:      Height:        Intake/Output Summary (Last 24 hours) at 07/30/16 1020 Last data filed at 07/30/16 1000  Gross per 24 hour  Intake          2296.08 ml  Output                0 ml  Net          2296.08 ml   Filed Weights   07/29/16 1558 07/30/16 0000  Weight: 50.8 kg (112 lb) 51.6 kg (113 lb 12.1 oz)    General appearance: alert, cooperative, appears stated age and no distress Resp: Coarse breath sounds bilaterally. Diminished air entry at the bases. No definite crackles or wheezing. Cardio: regular rate and rhythm, S1, S2 normal, no  murmur, click, rub or gallop GI: soft, non-tender; bowel sounds normal; no masses,  no organomegaly Extremities: extremities normal, atraumatic, no cyanosis or edema Neurologic: She is awake and alert. Oriented 3. Cranial nerves II-12 intact. Motor strength equal bilateral upper and lower extremities.  Lab Results:  Data Reviewed: I have personally reviewed following labs and imaging studies  CBC:  Recent Labs Lab 07/29/16 1725 07/30/16 0533  WBC 18.9* 11.6*  NEUTROABS 16.3*  --   HGB 4.0* 7.9*  HCT 12.6* 23.9*  MCV 102.4* 93.4  PLT 244 139*    Basic Metabolic Panel:  Recent Labs Lab 07/29/16 1614 07/30/16 0522  NA 142 139  K 5.1 4.5  CL 105 105  CO2 20* 17*  GLUCOSE 106* 104*  BUN 123* 114*  CREATININE 11.71* 11.46*  CALCIUM 7.8* 7.5*  MG  --  2.1  PHOS  --  5.9*    GFR: Estimated Creatinine Clearance: 4.7 mL/min (by C-G formula based on SCr of 11.46 mg/dL (H)).  Liver Function Tests:  Recent Labs Lab 07/29/16 1614 07/30/16 0522  AST 14* 14*  ALT 10* 9*  ALKPHOS 18* 15*  BILITOT 0.5 0.5  PROT 4.4* 4.1*  ALBUMIN 2.6* 2.3*    Coagulation Profile:  Recent Labs Lab 07/29/16 1725  INR 1.14    Cardiac Enzymes:  Recent Labs Lab 07/29/16 2028 07/30/16 0533  TROPONINI <0.03 <0.03    CBG:  Recent Labs Lab 07/30/16 0017  GLUCAP 117*    Thyroid Function Tests:  Recent Labs  07/30/16 0522  TSH 0.549     Recent Results (from the past 240 hour(s))  MRSA PCR Screening     Status: None   Collection Time: 07/30/16 12:37 AM  Result Value Ref Range Status   MRSA by PCR NEGATIVE NEGATIVE Final    Comment:        The GeneXpert MRSA Assay (FDA approved for NASAL specimens only), is one component of a comprehensive MRSA colonization surveillance program. It is not intended to diagnose MRSA infection nor to guide or monitor treatment for MRSA infections.       Radiology Studies: Ct Abdomen Pelvis Wo Contrast  Result Date:  07/29/2016 CLINICAL DATA:  GI bleed. History of renal mass, end-stage renal disease on dialysis. EXAM: CT ABDOMEN AND PELVIS WITHOUT CONTRAST TECHNIQUE: Multidetector CT imaging of the abdomen and pelvis was performed following the standard protocol without IV contrast. COMPARISON:  CT abdomen and pelvis with contrast July 08, 2015. FINDINGS: LOWER CHEST: Lingular atelectasis. The visualized heart size is normal. No pericardial effusion. HEPATOBILIARY: Normal. PANCREAS: Normal. SPLEEN: Normal. ADRENALS/URINARY TRACT: Status post RIGHT nephrectomy. Multiple small dense LEFT renal cysts measuring up to 19 mm. Limited assessment for renal masses on this nonenhanced examination. The unopacified ureters are normal in course and caliber. Urinary bladder is well distended and unremarkable. Normal adrenal glands. STOMACH/BOWEL: The stomach, small and large bowel are normal in course and caliber without inflammatory changes, sensitivity decreased by lack of enteric contrast. Normal appendix. VASCULAR/LYMPHATIC: Aortoiliac vessels are normal in course and caliber, mild calcific atherosclerosis. No lymphadenopathy by CT size criteria. REPRODUCTIVE: Normal. OTHER: Minimal intraperitoneal gas with peritoneal dialysis catheter terminating in the RIGHT pelvis. Small amount of ascites. MUSCULOSKELETAL: Non-acute. IMPRESSION: No acute intra-abdominal or pelvic process by noncontrast CT. Small amount of pneumoperitoneum and ascites, with peritoneal dialysis catheter in RIGHT pelvis. Status post RIGHT nephrectomy. Multiple dense/hemorrhagic cysts LEFT kidney. Electronically Signed   By: Elon Alas M.D.   On: 07/29/2016 22:23   Dg Chest 2 View  Result Date: 07/29/2016 CLINICAL DATA:  Central chest pain, fever and dark stools for 2 days EXAM: CHEST  2 VIEW COMPARISON:  09/23/2015 FINDINGS: The heart size and mediastinal contours are within normal limits. Both lungs are clear. The visualized skeletal structures are  unremarkable. IMPRESSION: No active cardiopulmonary disease. Electronically Signed   By: Kathreen Devoid   On: 07/29/2016 18:41   Dg Knee Complete 4 Views Left  Result Date: 07/29/2016 CLINICAL DATA:  Central chest pain, fever.  Bilateral knee pain. EXAM: RIGHT KNEE - COMPLETE 4+ VIEW; LEFT KNEE - COMPLETE 4+ VIEW COMPARISON:  None. FINDINGS: RIGHT KNEE: No evidence of fracture, dislocation, or joint effusion. No evidence of arthropathy or other focal bone abnormality. Soft tissues are unremarkable. LEFT KNEE: No evidence of fracture, dislocation, or joint effusion. No evidence of arthropathy or other focal bone abnormality. Soft tissues are unremarkable. IMPRESSION: 1.  No acute osseous injury of the right knee. 2.  No acute osseous injury of the left knee. Electronically Signed   By: Kathreen Devoid   On: 07/29/2016 18:42   Dg Knee Complete 4 Views Right  Result Date: 07/29/2016 CLINICAL DATA:  Central chest pain, fever.  Bilateral knee pain. EXAM: RIGHT KNEE - COMPLETE 4+ VIEW; LEFT KNEE - COMPLETE 4+ VIEW COMPARISON:  None. FINDINGS: RIGHT KNEE: No evidence of  fracture, dislocation, or joint effusion. No evidence of arthropathy or other focal bone abnormality. Soft tissues are unremarkable. LEFT KNEE: No evidence of fracture, dislocation, or joint effusion. No evidence of arthropathy or other focal bone abnormality. Soft tissues are unremarkable. IMPRESSION: 1.  No acute osseous injury of the right knee. 2.  No acute osseous injury of the left knee. Electronically Signed   By: Kathreen Devoid   On: 07/29/2016 18:42     Medications:  Scheduled: . sodium chloride  10 mL/hr Intravenous Once  . calcitRIOL  0.25 mcg Oral TID  . calcium acetate  2,001 mg Oral TID WC  . [START ON 07/31/2016] cinacalcet  60 mg Oral Q M,W,F  . dialysis solution 1.5% low-MG/low-CA  10 L Intraperitoneal Q24H  . gentamicin cream  1 application Topical Daily  . [START ON 08/02/2016] pantoprazole  40 mg Intravenous Q12H  .  piperacillin-tazobactam (ZOSYN)  IV  2.25 g Intravenous Q12H  . sodium bicarbonate  650 mg Oral BID  . sodium chloride flush  3 mL Intravenous Q12H  . sodium chloride flush  3 mL Intravenous Q12H   Continuous: . pantoprozole (PROTONIX) infusion 8 mg/hr (07/30/16 0746)   VZC:HYIFOY chloride, acetaminophen **OR** acetaminophen, calcium acetate, heparin, HYDROcodone-acetaminophen, ondansetron **OR** ondansetron (ZOFRAN) IV, sodium chloride flush  Assessment/Plan:  Active Problems:   Right renal mass   Hypertension   Anemia   ESRD on dialysis [facility])   Essential hypertension   Symptomatic anemia    Syncope in the setting of GI bleed with symptomatic anemia Patient was also experiencing loose stools at home. She could've become dehydrated as well. Vital signs are all stable now. Patient has been transfused PRBCs. Continue to monitor on telemetry for now.  Acute blood loss anemia Patient has had a significant drop in hemoglobin from her baseline. This is most likely secondary to GI bleed. Patient denies any history of peptic ulcer disease. No other source of bleeding identified. She has been transfused 3 units of PRBCs. Hemoglobin is up to 7.9. Continue to monitor for now.  GI bleed. Her presentation raises concern for upper GI bleed. Patient denies any history of peptic ulcer disease. She's never had GI bleed in the past. She is on a PPI infusion currently. Continue for now. Await GI input. She will likely need to undergo endoscopy.  Acute diarrhea Possible infectious etiology. GI pathogen panel is pending. CT scan of the abdomen did not show any acute findings.  Chest pain She did have some EKG changes which is thought to be secondary to tachycardia. Troponin is normal. Denies any chest pain this morning. Echocardiogram has been ordered and is pending. Continues to have subtle ST segment changes.  End-stage renal disease on pericardial dialysis. Management per  nephrology.  Questionable SIRS No obvious source of infection has been found. Cultures are pending. Due to her presenting signs and symptoms she was placed on broad-spectrum antibiotics, which will be continued for now. Influenza of PCR is negative. Lactic acid level normal. De-escalate antibiotics if cultures remain negative.  Essential hypertension Home medications are being held due to initial hypotension. Continue to monitor blood pressures closely. Blood pressure has improved.  History of Right renal mass She is status post resection. Currently stable. CT scan does show multiple left renal cysts. These were seen on previous imaging studies as well.  DVT Prophylaxis: SCDs    Code Status: Full code  Family Communication: Discussed with patient  Disposition Plan: Await GI input. Could potentially be transferred  to telemetry   LOS: 1 day   [facility] Pager [phone] 07/30/2016, 10:20 AM  If 7PM-7AM, please contact night-coverage at [email or web address], password [facility]

## 2016-07-30 NOTE — Progress Notes (Signed)
  Echocardiogram 2D Echocardiogram has been performed.  Darlina Sicilian M 07/30/2016, 10:18 AM

## 2016-07-30 NOTE — Op Note (Signed)
[employer] Patient Name: Terry Juarez Procedure Date : 07/30/2016 MRN: [id number] Attending MD: Carol Ada , MD Date of Birth: 1966/01/15 CSN: [id number] Age: 51 Admit Type: Inpatient Procedure:                Upper GI endoscopy Indications:              Iron deficiency anemia, Melena Providers:                Carol Ada, MD, Dortha Schwalbe RN, RN, Cherylynn Ridges, Technician Referring MD:              Medicines:                Midazolam 4 mg IV, Fentanyl 50 micrograms IV Complications:            No immediate complications. Estimated Blood Loss:     Estimated blood loss was minimal. Procedure:                Pre-Anesthesia Assessment:                           - Prior to the procedure, a History and Physical                            was performed, and patient medications and                            allergies were reviewed. The patient's tolerance of                            previous anesthesia was also reviewed. The risks                            and benefits of the procedure and the sedation                            options and risks were discussed with the patient.                            All questions were answered, and informed consent                            was obtained. Prior Anticoagulants: The patient has                            taken no previous anticoagulant or antiplatelet                            agents. ASA Grade Assessment: III - A patient with                            severe systemic disease. After reviewing the risks  and benefits, the patient was deemed in                            satisfactory condition to undergo the procedure.                           - Sedation was administered by an endoscopy nurse.                            The sedation level attained was moderate.                           After obtaining informed consent, the endoscope was          passed under direct vision. Throughout the                            procedure, the patient's blood pressure, pulse, and                            oxygen saturations were monitored continuously. The                            EG-[id number] ([id number]) scope was introduced through the                            mouth, and advanced to the second part of duodenum.                            The upper GI endoscopy was accomplished without                            difficulty. The patient tolerated the procedure                            well. Scope In: Scope Out: Findings:      The esophagus was normal.      Two non-bleeding superficial gastric ulcers with no stigmata of bleeding       were found in the gastric antrum. The largest lesion was 4 mm in largest       dimension. Biopsies were taken with a cold forceps for Helicobacter       pylori testing.      The examined duodenum was normal.      Initially the ulcers were not visualized with withdrawal of the       endoscope some bleeding as noted. The blood was traced back to some       edematous folds around the distal antrum/pylorus. Two clean-based ulcers       were identified, but the surrouding mucosa was friable. Impression:               - Normal esophagus.                           - Non-bleeding gastric ulcers with no stigmata of  bleeding. Biopsied.                           - Normal examined duodenum. Moderate Sedation:      Moderate (conscious) sedation was administered by the endoscopy nurse       and supervised by the endoscopist. The following parameters were       monitored: oxygen saturation, heart rate, blood pressure, and response       to care. Recommendation:           - Return patient to hospital ward for ongoing care.                           - Resume regular diet.                           - Continue present medications.                           - Await pathology results. If positive  for H.                            pylori treatment will be rendered.                           - PPI BID x 1 month and then QD indefinitely. Procedure Code(s):        --- Professional ---                           [phone], Esophagogastroduodenoscopy, flexible,                            transoral; with biopsy, single or multiple Diagnosis Code(s):        --- Professional ---                           K25.9, Gastric ulcer, unspecified as acute or                            chronic, without hemorrhage or perforation                           D50.9, Iron deficiency anemia, unspecified                           K92.1, Melena (includes Hematochezia) CPT copyright 2016 American Medical Association. All rights reserved. The codes documented in this report are preliminary and upon coder review may  be revised to meet current compliance requirements. Carol Ada, MD Carol Ada, MD 07/30/2016 5:00:30 PM This report has been signed electronically. Number of Addenda: 0

## 2016-07-30 NOTE — Consult Note (Signed)
Reason for Consult: Severe anemia and heme positive stool Referring Physician: [facility]  [name] HPI: This is a 51 year old female with a PMH of HTN, hyperlipidemiea, anemia, s/p right nephrectomy for renal cancer, and ESRD on peritoneal dialysis admitted for complaints of syncope.  The patient suffered with three bouts of syncope and he was then transported to the hospital.  One day prior to admission she had some dizziness and lightheadedness with some mild chest pain.  Her BP at that time was in the 90's for her SBP.  During this time she reports melena and some diarrhea this week.  Upon admission her HGB was found to be at 4.0 and this was a drop down from her 07/08/2015 value of 11.0.  Her hemoccult was positive.  With blood transfusions her HGB is now up to 8.3 g/dL.  Past Medical History:  Diagnosis Date  . Anemia   . Bruises easily   . Dialysis patient ([place])   . ESRD on dialysis ([place])   . Hyperlipidemia   . Hypertension   . Renal disorder    rt renal mass / < functioning of left kidney - being prepared for possible dialysis  . Right renal mass     Past Surgical History:  Procedure Laterality Date  . AV FISTULA PLACEMENT Right 07/11/2014   Procedure: ARTERIOVENOUS (AV) FISTULA CREATION RIGHT ARM BRACHIO-CEPHALIC WITH ATTEMPTED RADIO-CEPHALIC (AV) FISTULA;  Surgeon: Mal Misty, MD;  Location: [place];  Service: Vascular;  Laterality: Right;  . [place]  . INSERTION OF DIALYSIS CATHETER Right 07/11/2014   Procedure: INSERTION OF DIALYSIS CATHETER IN RIGHT INTERNAL JUGULAR ;  Surgeon: Mal Misty, MD;  Location: [place];  Service: Vascular;  Laterality: Right;  . LAPAROSCOPIC NEPHRECTOMY Right 07/25/2014   Procedure: RIGHT LAPAROSCOPIC RADICAL NEPHRECTOMY ;  Surgeon: Ardis Hughs, MD;  Location: [facility];  Service: Urology;  Laterality: Right;  . PATCH ANGIOPLASTY Right 07/11/2014   Procedure: PATCH ANGIOPLASTY OF RIGHT RADIAL ARTERY USING  CEPHALIC VEIN.;  Surgeon: Mal Misty, MD;  Location: [place];  Service: Vascular;  Laterality: Right;  . THROMBECTOMY W/ EMBOLECTOMY Right 07/11/2014   Procedure: THROMBECTOMY OF RIGHT RADIAL ARTERY  ;  Surgeon: Mal Misty, MD;  Location: [facility] OR;  Service: Vascular;  Laterality: Right;    Family History  Problem Relation Age of Onset  . Throat cancer Mother     smoked  . Hypertension Father     Social History:  reports that she has never smoked. She has never used smokeless tobacco. She reports that she drinks alcohol. She reports that she does not use drugs.  Allergies: No Known Allergies  Medications:  Scheduled: . sodium chloride  10 mL/hr Intravenous Once  . calcitRIOL  0.25 mcg Oral TID  . calcium acetate  2,001 mg Oral TID WC  . [START ON 07/31/2016] cinacalcet  60 mg Oral Q M,W,F  . dialysis solution 1.5% low-MG/low-CA  10 L Intraperitoneal Q24H  . gentamicin cream  1 application Topical Daily  . [START ON 08/02/2016] pantoprazole  40 mg Intravenous Q12H  . piperacillin-tazobactam (ZOSYN)  IV  2.25 g Intravenous Q12H  . sodium bicarbonate  650 mg Oral BID  . sodium chloride flush  3 mL Intravenous Q12H  . sodium chloride flush  3 mL Intravenous Q12H   Continuous: . pantoprozole (PROTONIX) infusion 8 mg/hr (07/30/16 0746)    Results for orders placed or performed during the hospital  encounter of 07/29/16 (from the past 24 hour(s))  Comprehensive metabolic panel     Status: Abnormal   Collection Time: 07/29/16  4:14 PM  Result Value Ref Range   Sodium 142 135 - 145 mmol/L   Potassium 5.1 3.5 - 5.1 mmol/L   Chloride 105 101 - 111 mmol/L   CO2 20 (L) 22 - 32 mmol/L   Glucose, Bld 106 (H) 65 - 99 mg/dL   BUN 123 (H) 6 - 20 mg/dL   Creatinine, Ser 11.71 (H) 0.44 - 1.00 mg/dL   Calcium 7.8 (L) 8.9 - 10.3 mg/dL   Total Protein 4.4 (L) 6.5 - 8.1 g/dL   Albumin 2.6 (L) 3.5 - 5.0 g/dL   AST 14 (L) 15 - 41 U/L   ALT 10 (L) 14 - 54 U/L   Alkaline Phosphatase 18 (L) 38 -  126 U/L   Total Bilirubin 0.5 0.3 - 1.2 mg/dL   GFR calc non Af Amer 3 (L) >60 mL/min   GFR calc Af Amer 4 (L) >60 mL/min   Anion gap 17 (H) 5 - 15  Protime-INR     Status: None   Collection Time: 07/29/16  5:25 PM  Result Value Ref Range   Prothrombin Time 14.6 11.4 - 15.2 seconds   INR 1.14   CBC with Differential/Platelet     Status: Abnormal   Collection Time: 07/29/16  5:25 PM  Result Value Ref Range   WBC 18.9 (H) 4.0 - 10.5 K/uL   RBC 1.23 (L) 3.87 - 5.11 MIL/uL   Hemoglobin 4.0 (LL) 12.0 - 15.0 g/dL   HCT 12.6 (L) 36.0 - 46.0 %   MCV 102.4 (H) 78.0 - 100.0 fL   MCH 32.5 26.0 - 34.0 pg   MCHC 31.7 30.0 - 36.0 g/dL   RDW 14.4 11.5 - 15.5 %   Platelets 244 150 - 400 K/uL   Neutrophils Relative % 86 %   Neutro Abs 16.3 (H) 1.7 - 7.7 K/uL   Lymphocytes Relative 8 %   Lymphs Abs 1.4 0.7 - 4.0 K/uL   Monocytes Relative 6 %   Monocytes Absolute 1.1 (H) 0.1 - 1.0 K/uL   Eosinophils Relative 0 %   Eosinophils Absolute 0.1 0.0 - 0.7 K/uL   Basophils Relative 0 %   Basophils Absolute 0.1 0.0 - 0.1 K/uL  Type and screen     Status: None (Preliminary result)   Collection Time: 07/29/16  5:30 PM  Result Value Ref Range   ISSUE DATE / TIME [id number]    Blood Product Unit Number [id number]    PRODUCT CODE G6269S85    Unit Type and Rh 5100    Blood Product Expiration Date 462703500938    ISSUE DATE / TIME 182993716967    Blood Product Unit Number [id number]    PRODUCT CODE E5277O24    Unit Type and Rh 5100    Blood Product Expiration Date 235361443154    ISSUE DATE / TIME [id number]    Blood Product Unit Number [id number]    PRODUCT CODE [id number]    Unit Type and Rh 5100    Blood Product Expiration Date [id number]   ABO/Rh     Status: None   Collection Time: 07/29/16  5:30 PM  Result Value Ref Range   ABO/RH(D) O POS   Prepare RBC     Status: None   Collection Time: 07/29/16  6:05 PM  Result Value Ref Range   Order Confirmation ORDER PROCESSED BY BLOOD  BANK  POC occult blood, ED     Status: Abnormal   Collection Time: 07/29/16  7:52 PM  Result Value Ref Range   Fecal Occult Bld POSITIVE (A) NEGATIVE  Troponin I (q 6hr x 3)     Status: None   Collection Time: 07/29/16  8:28 PM  Result Value Ref Range   Troponin I <0.03 <0.03 ng/mL  Procalcitonin     Status: None   Collection Time: 07/29/16  8:39 PM  Result Value Ref Range   Procalcitonin 1.03 ng/mL  Prepare RBC     Status: None   Collection Time: 07/29/16 11:31 PM  Result Value Ref Range   Order Confirmation ORDER PROCESSED BY BLOOD BANK   Glucose, capillary     Status: Abnormal   Collection Time: 07/30/16 12:17 AM  Result Value Ref Range   Glucose-Capillary 117 (H) 65 - 99 mg/dL   Comment 1 Notify RN    Comment 2 Document in Chart   Influenza panel by PCR (type A & B)     Status: None   Collection Time: 07/30/16 12:37 AM  Result Value Ref Range   Influenza A By PCR NEGATIVE NEGATIVE   Influenza B By PCR NEGATIVE NEGATIVE  MRSA PCR Screening     Status: None   Collection Time: 07/30/16 12:37 AM  Result Value Ref Range   MRSA by PCR NEGATIVE NEGATIVE  Magnesium     Status: None   Collection Time: 07/30/16  5:22 AM  Result Value Ref Range   Magnesium 2.1 1.7 - 2.4 mg/dL  Phosphorus     Status: Abnormal   Collection Time: 07/30/16  5:22 AM  Result Value Ref Range   Phosphorus 5.9 (H) 2.5 - 4.6 mg/dL  TSH     Status: None   Collection Time: 07/30/16  5:22 AM  Result Value Ref Range   TSH 0.549 0.350 - 4.500 uIU/mL  Comprehensive metabolic panel     Status: Abnormal   Collection Time: 07/30/16  5:22 AM  Result Value Ref Range   Sodium 139 135 - 145 mmol/L   Potassium 4.5 3.5 - 5.1 mmol/L   Chloride 105 101 - 111 mmol/L   CO2 17 (L) 22 - 32 mmol/L   Glucose, Bld 104 (H) 65 - 99 mg/dL   BUN 114 (H) 6 - 20 mg/dL   Creatinine, Ser 11.46 (H) 0.44 - 1.00 mg/dL   Calcium 7.5 (L) 8.9 - 10.3 mg/dL   Total Protein 4.1 (L) 6.5 - 8.1 g/dL   Albumin 2.3 (L) 3.5 - 5.0 g/dL    AST 14 (L) 15 - 41 U/L   ALT 9 (L) 14 - 54 U/L   Alkaline Phosphatase 15 (L) 38 - 126 U/L   Total Bilirubin 0.5 0.3 - 1.2 mg/dL   GFR calc non Af Amer 3 (L) >60 mL/min   GFR calc Af Amer 4 (L) >60 mL/min   Anion gap 17 (H) 5 - 15  Lactic acid, plasma     Status: None   Collection Time: 07/30/16  5:22 AM  Result Value Ref Range   Lactic Acid, Venous 1.4 0.5 - 1.9 mmol/L  Troponin I (q 6hr x 3)     Status: None   Collection Time: 07/30/16  5:33 AM  Result Value Ref Range   Troponin I <0.03 <0.03 ng/mL  CBC     Status: Abnormal   Collection Time: 07/30/16  5:33 AM  Result Value Ref Range   WBC 11.6 (H) 4.0 -  10.5 K/uL   RBC 2.56 (L) 3.87 - 5.11 MIL/uL   Hemoglobin 7.9 (L) 12.0 - 15.0 g/dL   HCT 23.9 (L) 36.0 - 46.0 %   MCV 93.4 78.0 - 100.0 fL   MCH 30.9 26.0 - 34.0 pg   MCHC 33.1 30.0 - 36.0 g/dL   RDW 16.0 (H) 11.5 - 15.5 %   Platelets 139 (L) 150 - 400 K/uL  BLOOD TRANSFUSION REPORT - SCANNED     Status: None   Collection Time: 07/30/16 10:29 AM   Narrative   Ordered by an unspecified provider.  CBC     Status: Abnormal   Collection Time: 07/30/16 10:38 AM  Result Value Ref Range   WBC 9.7 4.0 - 10.5 K/uL   RBC 2.70 (L) 3.87 - 5.11 MIL/uL   Hemoglobin 8.3 (L) 12.0 - 15.0 g/dL   HCT 24.9 (L) 36.0 - 46.0 %   MCV 92.2 78.0 - 100.0 fL   MCH 30.7 26.0 - 34.0 pg   MCHC 33.3 30.0 - 36.0 g/dL   RDW 16.1 (H) 11.5 - 15.5 %   Platelets 168 150 - 400 K/uL  Troponin I (q 6hr x 3)     Status: None   Collection Time: 07/30/16 10:38 AM  Result Value Ref Range   Troponin I <0.03 <0.03 ng/mL  Lactic acid, plasma     Status: None   Collection Time: 07/30/16 10:38 AM  Result Value Ref Range   Lactic Acid, Venous 1.6 0.5 - 1.9 mmol/L     Ct Abdomen Pelvis Wo Contrast  Result Date: 07/29/2016 CLINICAL DATA:  GI bleed. History of renal mass, end-stage renal disease on dialysis. EXAM: CT ABDOMEN AND PELVIS WITHOUT CONTRAST TECHNIQUE: Multidetector CT imaging of the abdomen and pelvis  was performed following the standard protocol without IV contrast. COMPARISON:  CT abdomen and pelvis with contrast July 08, 2015. FINDINGS: LOWER CHEST: Lingular atelectasis. The visualized heart size is normal. No pericardial effusion. HEPATOBILIARY: Normal. PANCREAS: Normal. SPLEEN: Normal. ADRENALS/URINARY TRACT: Status post RIGHT nephrectomy. Multiple small dense LEFT renal cysts measuring up to 19 mm. Limited assessment for renal masses on this nonenhanced examination. The unopacified ureters are normal in course and caliber. Urinary bladder is well distended and unremarkable. Normal adrenal glands. STOMACH/BOWEL: The stomach, small and large bowel are normal in course and caliber without inflammatory changes, sensitivity decreased by lack of enteric contrast. Normal appendix. VASCULAR/LYMPHATIC: Aortoiliac vessels are normal in course and caliber, mild calcific atherosclerosis. No lymphadenopathy by CT size criteria. REPRODUCTIVE: Normal. OTHER: Minimal intraperitoneal gas with peritoneal dialysis catheter terminating in the RIGHT pelvis. Small amount of ascites. MUSCULOSKELETAL: Non-acute. IMPRESSION: No acute intra-abdominal or pelvic process by noncontrast CT. Small amount of pneumoperitoneum and ascites, with peritoneal dialysis catheter in RIGHT pelvis. Status post RIGHT nephrectomy. Multiple dense/hemorrhagic cysts LEFT kidney. Electronically Signed   By: Elon Alas M.D.   On: 07/29/2016 22:23   Dg Chest 2 View  Result Date: 07/29/2016 CLINICAL DATA:  Central chest pain, fever and dark stools for 2 days EXAM: CHEST  2 VIEW COMPARISON:  09/23/2015 FINDINGS: The heart size and mediastinal contours are within normal limits. Both lungs are clear. The visualized skeletal structures are unremarkable. IMPRESSION: No active cardiopulmonary disease. Electronically Signed   By: Kathreen Devoid   On: 07/29/2016 18:41   Dg Knee Complete 4 Views Left  Result Date: 07/29/2016 CLINICAL DATA:  Central  chest pain, fever.  Bilateral knee pain. EXAM: RIGHT KNEE - COMPLETE 4+ VIEW;  LEFT KNEE - COMPLETE 4+ VIEW COMPARISON:  None. FINDINGS: RIGHT KNEE: No evidence of fracture, dislocation, or joint effusion. No evidence of arthropathy or other focal bone abnormality. Soft tissues are unremarkable. LEFT KNEE: No evidence of fracture, dislocation, or joint effusion. No evidence of arthropathy or other focal bone abnormality. Soft tissues are unremarkable. IMPRESSION: 1.  No acute osseous injury of the right knee. 2.  No acute osseous injury of the left knee. Electronically Signed   By: Kathreen Devoid   On: 07/29/2016 18:42   Dg Knee Complete 4 Views Right  Result Date: 07/29/2016 CLINICAL DATA:  Central chest pain, fever.  Bilateral knee pain. EXAM: RIGHT KNEE - COMPLETE 4+ VIEW; LEFT KNEE - COMPLETE 4+ VIEW COMPARISON:  None. FINDINGS: RIGHT KNEE: No evidence of fracture, dislocation, or joint effusion. No evidence of arthropathy or other focal bone abnormality. Soft tissues are unremarkable. LEFT KNEE: No evidence of fracture, dislocation, or joint effusion. No evidence of arthropathy or other focal bone abnormality. Soft tissues are unremarkable. IMPRESSION: 1.  No acute osseous injury of the right knee. 2.  No acute osseous injury of the left knee. Electronically Signed   By: Kathreen Devoid   On: 07/29/2016 18:42    ROS:  As stated above in the HPI otherwise negative.  Blood pressure (!) 140/53, pulse 74, temperature 98.4 F (36.9 C), temperature source Oral, resp. rate 12, height 5\' 5"  (1.651 m), weight 51.6 kg (113 lb 12.1 oz), SpO2 100 %.    PE: Gen: NAD, Alert and Oriented HEENT:  [place]/AT, EOMI Neck: Supple, no LAD Lungs: CTA Bilaterally CV: RRR without M/G/R ABM: Soft, NTND, +BS Ext: No C/C/E  Assessment/Plan: 1) Severe anemia. 2) Melena 3) Syncope. 4) ESRD on PD.   With the magnitude of her anemia with syncope further evaluation with an EGD is required at this time.  1) EGD  today. Dodd Schmid D 07/30/2016, 12:50 PM

## 2016-07-30 NOTE — Care Management Note (Signed)
Case Management Note  Patient Details  Name: RAMIE PALLADINO MRN: [id number] Date of Birth: 07-May-1966  Subjective/Objective:    Pt admitted with GI bleed, symptomatic anemia, dehydration SIRS  Action/Plan:   Pt alert and oriented.  PTA pt was independent at home with roommate.  Pt stated she has been on PD for approximately 1 year - supplies are shipped to home by [facility].  Pt denied barriers to discharging back home.  CM will continue to follow for discharge needs   Expected Discharge Date:                  Expected Discharge Plan:  Home/Self Care  In-House Referral:     Discharge planning Services  CM Consult  Post Acute Care Choice:    Choice offered to:     DME Arranged:    DME Agency:     HH Arranged:    HH Agency:     Status of Service:  In process, will continue to follow  If discussed at Long Length of Stay Meetings, dates discussed:    Additional Comments:  Maryclare Labrador, RN 07/30/2016, 2:05 PM

## 2016-07-31 ENCOUNTER — Encounter (HOSPITAL_COMMUNITY): Payer: Self-pay | Admitting: Gastroenterology

## 2016-07-31 DIAGNOSIS — K274 Chronic or unspecified peptic ulcer, site unspecified, with hemorrhage: Secondary | ICD-10-CM

## 2016-07-31 DIAGNOSIS — K279 Peptic ulcer, site unspecified, unspecified as acute or chronic, without hemorrhage or perforation: Secondary | ICD-10-CM

## 2016-07-31 LAB — GASTROINTESTINAL PANEL BY PCR, STOOL (REPLACES STOOL CULTURE)
Adenovirus F40/41: NOT DETECTED
Astrovirus: NOT DETECTED
CYCLOSPORA CAYETANENSIS: NOT DETECTED
Campylobacter species: NOT DETECTED
Cryptosporidium: NOT DETECTED
ENTAMOEBA HISTOLYTICA: NOT DETECTED
Enteroaggregative E coli (EAEC): NOT DETECTED
Enteropathogenic E coli (EPEC): NOT DETECTED
Enterotoxigenic E coli (ETEC): NOT DETECTED
Giardia lamblia: NOT DETECTED
NOROVIRUS GI/GII: NOT DETECTED
PLESIMONAS SHIGELLOIDES: NOT DETECTED
Rotavirus A: NOT DETECTED
SAPOVIRUS (I, II, IV, AND V): NOT DETECTED
SHIGA LIKE TOXIN PRODUCING E COLI (STEC): NOT DETECTED
SHIGELLA/ENTEROINVASIVE E COLI (EIEC): NOT DETECTED
Salmonella species: NOT DETECTED
VIBRIO CHOLERAE: NOT DETECTED
Vibrio species: NOT DETECTED
Yersinia enterocolitica: NOT DETECTED

## 2016-07-31 LAB — URINALYSIS, ROUTINE W REFLEX MICROSCOPIC
BILIRUBIN URINE: NEGATIVE
Glucose, UA: NEGATIVE mg/dL
KETONES UR: NEGATIVE mg/dL
LEUKOCYTES UA: NEGATIVE
NITRITE: NEGATIVE
PROTEIN: 30 mg/dL — AB
Specific Gravity, Urine: 1.009 (ref 1.005–1.030)
pH: 6 (ref 5.0–8.0)

## 2016-07-31 LAB — CBC
HCT: 24.2 % — ABNORMAL LOW (ref 36.0–46.0)
HEMATOCRIT: 24.8 % — AB (ref 36.0–46.0)
Hemoglobin: 8 g/dL — ABNORMAL LOW (ref 12.0–15.0)
Hemoglobin: 8.2 g/dL — ABNORMAL LOW (ref 12.0–15.0)
MCH: 31 pg (ref 26.0–34.0)
MCH: 31.4 pg (ref 26.0–34.0)
MCHC: 33.1 g/dL (ref 30.0–36.0)
MCHC: 33.1 g/dL (ref 30.0–36.0)
MCV: 93.8 fL (ref 78.0–100.0)
MCV: 95 fL (ref 78.0–100.0)
PLATELETS: 140 10*3/uL — AB (ref 150–400)
Platelets: 157 10*3/uL (ref 150–400)
RBC: 2.58 MIL/uL — AB (ref 3.87–5.11)
RBC: 2.61 MIL/uL — AB (ref 3.87–5.11)
RDW: 16.8 % — ABNORMAL HIGH (ref 11.5–15.5)
RDW: 16.6 % — ABNORMAL HIGH (ref 11.5–15.5)
WBC: 8.5 10*3/uL (ref 4.0–10.5)
WBC: 9.3 10*3/uL (ref 4.0–10.5)

## 2016-07-31 MED ORDER — PANTOPRAZOLE SODIUM 40 MG PO TBEC
40.0000 mg | DELAYED_RELEASE_TABLET | Freq: Two times a day (BID) | ORAL | Status: DC
  Administered 2016-07-31 – 2016-08-02 (×5): 40 mg via ORAL
  Filled 2016-07-31 (×5): qty 1

## 2016-07-31 MED ORDER — AMLODIPINE BESYLATE 10 MG PO TABS
10.0000 mg | ORAL_TABLET | Freq: Every day | ORAL | Status: DC
  Administered 2016-07-31 – 2016-08-01 (×2): 10 mg via ORAL
  Filled 2016-07-31 (×2): qty 1

## 2016-07-31 MED ORDER — LISINOPRIL 20 MG PO TABS
20.0000 mg | ORAL_TABLET | Freq: Every day | ORAL | Status: DC
  Administered 2016-08-01 – 2016-08-02 (×2): 20 mg via ORAL
  Filled 2016-07-31 (×2): qty 1

## 2016-07-31 NOTE — Progress Notes (Signed)
  [facility] Progress Note   Subjective: feeling better, EGD with gastric ulcers.  Hb 8 range  Vitals:   07/31/16 0600 07/31/16 0623 07/31/16 0821 07/31/16 1219  BP:  (!) 160/69 (!) 159/65 (!) 133/46  Pulse: 69 83 73 88  Resp: 15 11 12 13   Temp:   98.2 F (36.8 C) 98.3 F (36.8 C)  TempSrc:   Oral Oral  SpO2: 99% 100% 100% 99%  Weight: 52.4 kg (115 lb 8.3 oz)     Height:        Inpatient medications: . sodium chloride  10 mL/hr Intravenous Once  . calcitRIOL  0.25 mcg Oral TID  . calcium acetate  2,001 mg Oral TID WC  . cinacalcet  60 mg Oral Q M,W,F  . dialysis solution 1.5% low-MG/low-CA  10 L Intraperitoneal Q24H  . gentamicin cream  1 application Topical Daily  . pantoprazole  40 mg Oral BID  . piperacillin-tazobactam (ZOSYN)  IV  2.25 g Intravenous Q12H  . sodium bicarbonate  650 mg Oral BID  . sodium chloride flush  3 mL Intravenous Q12H  . sodium chloride flush  3 mL Intravenous Q12H   . sodium chloride 20 mL/hr at 07/31/16 0620   sodium chloride, acetaminophen **OR** acetaminophen, calcium acetate, heparin, HYDROcodone-acetaminophen, ondansetron **OR** ondansetron (ZOFRAN) IV, sodium chloride flush  Exam: Alert, thin AAF, no distress  no jvd Chest clear bialt RRR +2/6 sem, no rg Abd soft ntnd PD cath inplace Ext no edema NF, ox3  Dialysis: on PD, 5 exchanges overnight , no day fluid, no pause      Assessment: 1. Anemia/ GIB - sp EGD, +gastric ulcers. PPI prescribed. 2. ESRD on PD 3. Vol is stable on exam 4. HTN bp's good 5. Dispo - ok for dc from renal standpoint   Plan - cont PD while here   [name] [place] pager [phone]   07/31/2016, 1:24 PM    Recent Labs Lab 07/29/16 1614 07/30/16 0522  NA 142 139  K 5.1 4.5  CL 105 105  CO2 20* 17*  GLUCOSE 106* 104*  BUN 123* 114*  CREATININE 11.71* 11.46*  CALCIUM 7.8* 7.5*  PHOS  --  5.9*    Recent Labs Lab 07/29/16 1614 07/30/16 0522  AST  14* 14*  ALT 10* 9*  ALKPHOS 18* 15*  BILITOT 0.5 0.5  PROT 4.4* 4.1*  ALBUMIN 2.6* 2.3*    Recent Labs Lab 07/29/16 1725  07/30/16 1525 07/30/16 2249 07/31/16 0450  WBC 18.9*  < > 10.9* 10.0 8.5  NEUTROABS 16.3*  --   --   --   --   HGB 4.0*  < > 8.6* 8.3* 8.0*  HCT 12.6*  < > 25.9* 25.5* 24.2*  MCV 102.4*  < > 92.5 93.4 93.8  PLT 244  < > 160 161 140*  < > = values in this interval not displayed. Iron/TIBC/Ferritin/ %Sat No results found for: IRON, TIBC, FERRITIN, IRONPCTSAT

## 2016-07-31 NOTE — Progress Notes (Signed)
Subjective: No complaints.  Objective: Vital signs in last 24 hours: Temp:  [97.9 F (36.6 C)-98.4 F (36.9 C)] 98.2 F (36.8 C) (01/26 0400) Pulse Rate:  [69-83] 83 (01/26 0623) Resp:  [10-17] 11 (01/26 0623) BP: (112-160)/(35-82) 160/69 (01/26 0623) SpO2:  [98 %-100 %] 100 % (01/26 0623) Weight:  [52.4 kg (115 lb 8.3 oz)] 52.4 kg (115 lb 8.3 oz) (01/26 0600) Last BM Date: 07/30/16  Intake/Output from previous day: 01/25 0701 - 01/26 0700 In: 1156 [I.V.:606; IV Piggyback:550] Out: -  Intake/Output this shift: No intake/output data recorded.  General appearance: alert and no distress GI: soft, non-tender; bowel sounds normal; no masses,  no organomegaly  Lab Results:  Recent Labs  07/30/16 1525 07/30/16 2249 07/31/16 0450  WBC 10.9* 10.0 8.5  HGB 8.6* 8.3* 8.0*  HCT 25.9* 25.5* 24.2*  PLT 160 161 140*   BMET  Recent Labs  07/29/16 1614 07/30/16 0522  NA 142 139  K 5.1 4.5  CL 105 105  CO2 20* 17*  GLUCOSE 106* 104*  BUN 123* 114*  CREATININE 11.71* 11.46*  CALCIUM 7.8* 7.5*   LFT  Recent Labs  07/30/16 0522  PROT 4.1*  ALBUMIN 2.3*  AST 14*  ALT 9*  ALKPHOS 15*  BILITOT 0.5   PT/INR  Recent Labs  07/29/16 1725  LABPROT 14.6  INR 1.14   Hepatitis Panel No results for input(s): HEPBSAG, HCVAB, HEPAIGM, HEPBIGM in the last 72 hours. C-Diff No results for input(s): CDIFFTOX in the last 72 hours. Fecal Lactopherrin No results for input(s): FECLLACTOFRN in the last 72 hours.  Studies/Results: Ct Abdomen Pelvis Wo Contrast  Result Date: 07/29/2016 CLINICAL DATA:  GI bleed. History of renal mass, end-stage renal disease on dialysis. EXAM: CT ABDOMEN AND PELVIS WITHOUT CONTRAST TECHNIQUE: Multidetector CT imaging of the abdomen and pelvis was performed following the standard protocol without IV contrast. COMPARISON:  CT abdomen and pelvis with contrast July 08, 2015. FINDINGS: LOWER CHEST: Lingular atelectasis. The visualized heart size is  normal. No pericardial effusion. HEPATOBILIARY: Normal. PANCREAS: Normal. SPLEEN: Normal. ADRENALS/URINARY TRACT: Status post RIGHT nephrectomy. Multiple small dense LEFT renal cysts measuring up to 19 mm. Limited assessment for renal masses on this nonenhanced examination. The unopacified ureters are normal in course and caliber. Urinary bladder is well distended and unremarkable. Normal adrenal glands. STOMACH/BOWEL: The stomach, small and large bowel are normal in course and caliber without inflammatory changes, sensitivity decreased by lack of enteric contrast. Normal appendix. VASCULAR/LYMPHATIC: Aortoiliac vessels are normal in course and caliber, mild calcific atherosclerosis. No lymphadenopathy by CT size criteria. REPRODUCTIVE: Normal. OTHER: Minimal intraperitoneal gas with peritoneal dialysis catheter terminating in the RIGHT pelvis. Small amount of ascites. MUSCULOSKELETAL: Non-acute. IMPRESSION: No acute intra-abdominal or pelvic process by noncontrast CT. Small amount of pneumoperitoneum and ascites, with peritoneal dialysis catheter in RIGHT pelvis. Status post RIGHT nephrectomy. Multiple dense/hemorrhagic cysts LEFT kidney. Electronically Signed   By: Elon Alas M.D.   On: 07/29/2016 22:23   Dg Chest 2 View  Result Date: 07/29/2016 CLINICAL DATA:  Central chest pain, fever and dark stools for 2 days EXAM: CHEST  2 VIEW COMPARISON:  09/23/2015 FINDINGS: The heart size and mediastinal contours are within normal limits. Both lungs are clear. The visualized skeletal structures are unremarkable. IMPRESSION: No active cardiopulmonary disease. Electronically Signed   By: Kathreen Devoid   On: 07/29/2016 18:41   Dg Knee Complete 4 Views Left  Result Date: 07/29/2016 CLINICAL DATA:  Central chest pain, fever.  Bilateral knee pain. EXAM: RIGHT KNEE - COMPLETE 4+ VIEW; LEFT KNEE - COMPLETE 4+ VIEW COMPARISON:  None. FINDINGS: RIGHT KNEE: No evidence of fracture, dislocation, or joint effusion. No  evidence of arthropathy or other focal bone abnormality. Soft tissues are unremarkable. LEFT KNEE: No evidence of fracture, dislocation, or joint effusion. No evidence of arthropathy or other focal bone abnormality. Soft tissues are unremarkable. IMPRESSION: 1.  No acute osseous injury of the right knee. 2.  No acute osseous injury of the left knee. Electronically Signed   By: Kathreen Devoid   On: 07/29/2016 18:42   Dg Knee Complete 4 Views Right  Result Date: 07/29/2016 CLINICAL DATA:  Central chest pain, fever.  Bilateral knee pain. EXAM: RIGHT KNEE - COMPLETE 4+ VIEW; LEFT KNEE - COMPLETE 4+ VIEW COMPARISON:  None. FINDINGS: RIGHT KNEE: No evidence of fracture, dislocation, or joint effusion. No evidence of arthropathy or other focal bone abnormality. Soft tissues are unremarkable. LEFT KNEE: No evidence of fracture, dislocation, or joint effusion. No evidence of arthropathy or other focal bone abnormality. Soft tissues are unremarkable. IMPRESSION: 1.  No acute osseous injury of the right knee. 2.  No acute osseous injury of the left knee. Electronically Signed   By: Kathreen Devoid   On: 07/29/2016 18:42    Medications:  Scheduled: . sodium chloride  10 mL/hr Intravenous Once  . calcitRIOL  0.25 mcg Oral TID  . calcium acetate  2,001 mg Oral TID WC  . cinacalcet  60 mg Oral Q M,W,F  . dialysis solution 1.5% low-MG/low-CA  10 L Intraperitoneal Q24H  . gentamicin cream  1 application Topical Daily  . pantoprazole  40 mg Oral BID  . piperacillin-tazobactam (ZOSYN)  IV  2.25 g Intravenous Q12H  . sodium bicarbonate  650 mg Oral BID  . sodium chloride flush  3 mL Intravenous Q12H  . sodium chloride flush  3 mL Intravenous Q12H   Continuous: . sodium chloride 20 mL/hr at 07/31/16 0620    Assessment/Plan: 1) Distal antral ulcers. 2) Anemia. 3) ESRD on PD.   The patient is well at this time.  No complaints.  Her HGB has drifted down, but this may be equilibration.  No complaints of  melena.  Plan: 1) PPI BID x 1 month and then QD indefinitely. 2) I will follow up on the gastric biopsies. 3) Follow up in the office in 2 weeks. 4) Signing off.  LOS: 2 days   Braedyn Kauk D 07/31/2016, 8:15 AM

## 2016-07-31 NOTE — Progress Notes (Signed)
Pharmacy Antibiotic Note  Terry Juarez is a 51 y.o. female admitted on 07/29/2016 with sepsis.  Pharmacy has been consulted for vancomycin and zosyn dosing. Pt with ESRD on PD, first dose vancomycin given on 1/25 ~ 8pm -WBC= 8.5, afebrile, cultures pending   Plan: Will check a random vancomycin level on 1/29 Zosyn 3.375gm IV x 1 then 2.25gm IV Q12H F/u renal fxn, C&S, clinical status and vanc levels as needed  Height: 5\' 5"  (165.1 cm) Weight: 115 lb 8.3 oz (52.4 kg) IBW/kg (Calculated) : 57  Temp (24hrs), Avg:98.2 F (36.8 C), Min:97.9 F (36.6 C), Max:98.4 F (36.9 C)   Recent Labs Lab 07/29/16 1614  07/30/16 0522 07/30/16 0533 07/30/16 1038 07/30/16 1525 07/30/16 2249 07/31/16 0450  WBC  --   < >  --  11.6* 9.7 10.9* 10.0 8.5  CREATININE 11.71*  --  11.46*  --   --   --   --   --   LATICACIDVEN  --   --  1.4  --  1.6  --   --   --   < > = values in this interval not displayed.  Estimated Creatinine Clearance: 4.8 mL/min (by C-G formula based on SCr of 11.46 mg/dL (H)).    No Known Allergies  Antimicrobials this admission: Vanc 1/24>> Zosyn 1/24>>  Dose adjustments this admission: N/A  Microbiology results: MRSA PCR- negative 1/25 Blood >> 1/24 GI Panel PCR >>  Thank you for allowing pharmacy to be a part of this patient's care.  Hildred Laser, Pharm D 07/31/2016 9:04 AM

## 2016-07-31 NOTE — Progress Notes (Addendum)
[facility] PROGRESS NOTE  Terry Juarez PPI:[id number] DOB: 1965/09/23 DOA: 07/29/2016  PCP: [name], MD  Brief History/Interval Summary: 51 year old female with a past medical history of end-stage renal disease on peritoneal dialysis, hypertension, anemia, history of renal cell carcinoma status post nephrectomy, who presented after sustaining multiple episodes of syncope. Patient had been sick for the past few days or with diarrhea consisting of dark tarry stools. Patient was found to be significantly anemic with a hemoglobin of 4. She had heme positive stool. She was hospitalized for further management. There was also reports of fever, chills.  Reason for Visit: Blood loss anemia. GI bleed.  Consultants: Gastroenterology. Nephrology.  Procedures:  Peritoneal dialysis  Transthoracic echocardiogram Study Conclusions  - Left ventricle: The cavity size was normal. Systolic function was   normal. The estimated ejection fraction was in the range of 60%   to 65%. Wall motion was normal; there were no regional wall   motion abnormalities. - Mitral valve: There was mild regurgitation.  EGD 1/25 Impression:     - Normal esophagus. - Non-bleeding gastric ulcers with no stigmata of bleeding. Biopsied. - Normal examined duodenum.  Antibiotics: Vancomycin and Zosyn  Subjective/Interval History: Patient feels well this morning. Had an episode of black stool overnight. Denies any abdominal pain, nausea or vomiting.   ROS: Denies any chest pain or shortness of breath  Objective:  Vital Signs  Vitals:   07/31/16 0400 07/31/16 0500 07/31/16 0600 07/31/16 0623  BP: (!) 160/69   (!) 160/69  Pulse: 72 73 69 83  Resp: 12 12 15 11   Temp: 98.2 F (36.8 C)     TempSrc: Oral     SpO2: 99% 99% 99% 100%  Weight:   52.4 kg (115 lb 8.3 oz)   Height:        Intake/Output Summary (Last 24 hours) at 07/31/16 0754 Last data filed at 07/31/16 0600  Gross per 24 hour    Intake              656 ml  Output                0 ml  Net              656 ml   Filed Weights   07/29/16 1558 07/30/16 0000 07/31/16 0600  Weight: 50.8 kg (112 lb) 51.6 kg (113 lb 12.1 oz) 52.4 kg (115 lb 8.3 oz)    General appearance: alert, cooperative, appears stated age and no distress Resp: Coarse breath sounds bilaterally. Diminished air entry at the bases. No definite crackles or wheezing. Cardio: regular rate and rhythm, S1, S2 normal, no murmur, click, rub or gallop GI: soft, non-tender; bowel sounds normal; no masses,  no organomegaly Neurologic: Awake and alert. Oriented 3. No focal neurological deficits.  Lab Results:  Data Reviewed: I have personally reviewed following labs and imaging studies  CBC:  Recent Labs Lab 07/29/16 1725 07/30/16 0533 07/30/16 1038 07/30/16 1525 07/30/16 2249 07/31/16 0450  WBC 18.9* 11.6* 9.7 10.9* 10.0 8.5  NEUTROABS 16.3*  --   --   --   --   --   HGB 4.0* 7.9* 8.3* 8.6* 8.3* 8.0*  HCT 12.6* 23.9* 24.9* 25.9* 25.5* 24.2*  MCV 102.4* 93.4 92.2 92.5 93.4 93.8  PLT 244 139* 168 160 161 140*    Basic Metabolic Panel:  Recent Labs Lab 07/29/16 1614 07/30/16 0522  NA 142 139  K 5.1 4.5  CL 105  105  CO2 20* 17*  GLUCOSE 106* 104*  BUN 123* 114*  CREATININE 11.71* 11.46*  CALCIUM 7.8* 7.5*  MG  --  2.1  PHOS  --  5.9*    GFR: Estimated Creatinine Clearance: 4.8 mL/min (by C-G formula based on SCr of 11.46 mg/dL (H)).  Liver Function Tests:  Recent Labs Lab 07/29/16 1614 07/30/16 0522  AST 14* 14*  ALT 10* 9*  ALKPHOS 18* 15*  BILITOT 0.5 0.5  PROT 4.4* 4.1*  ALBUMIN 2.6* 2.3*    Coagulation Profile:  Recent Labs Lab 07/29/16 1725  INR 1.14    Cardiac Enzymes:  Recent Labs Lab 07/29/16 2028 07/30/16 0533 07/30/16 1038 07/30/16 1904  TROPONINI <0.03 <0.03 <0.03 <0.03    CBG:  Recent Labs Lab 07/30/16 0017  GLUCAP 117*    Thyroid Function Tests:  Recent Labs  07/30/16 0522  TSH  0.549     Recent Results (from the past 240 hour(s))  MRSA PCR Screening     Status: None   Collection Time: 07/30/16 12:37 AM  Result Value Ref Range Status   MRSA by PCR NEGATIVE NEGATIVE Final    Comment:        The GeneXpert MRSA Assay (FDA approved for NASAL specimens only), is one component of a comprehensive MRSA colonization surveillance program. It is not intended to diagnose MRSA infection nor to guide or monitor treatment for MRSA infections.       Radiology Studies: Ct Abdomen Pelvis Wo Contrast  Result Date: 07/29/2016 CLINICAL DATA:  GI bleed. History of renal mass, end-stage renal disease on dialysis. EXAM: CT ABDOMEN AND PELVIS WITHOUT CONTRAST TECHNIQUE: Multidetector CT imaging of the abdomen and pelvis was performed following the standard protocol without IV contrast. COMPARISON:  CT abdomen and pelvis with contrast July 08, 2015. FINDINGS: LOWER CHEST: Lingular atelectasis. The visualized heart size is normal. No pericardial effusion. HEPATOBILIARY: Normal. PANCREAS: Normal. SPLEEN: Normal. ADRENALS/URINARY TRACT: Status post RIGHT nephrectomy. Multiple small dense LEFT renal cysts measuring up to 19 mm. Limited assessment for renal masses on this nonenhanced examination. The unopacified ureters are normal in course and caliber. Urinary bladder is well distended and unremarkable. Normal adrenal glands. STOMACH/BOWEL: The stomach, small and large bowel are normal in course and caliber without inflammatory changes, sensitivity decreased by lack of enteric contrast. Normal appendix. VASCULAR/LYMPHATIC: Aortoiliac vessels are normal in course and caliber, mild calcific atherosclerosis. No lymphadenopathy by CT size criteria. REPRODUCTIVE: Normal. OTHER: Minimal intraperitoneal gas with peritoneal dialysis catheter terminating in the RIGHT pelvis. Small amount of ascites. MUSCULOSKELETAL: Non-acute. IMPRESSION: No acute intra-abdominal or pelvic process by noncontrast CT.  Small amount of pneumoperitoneum and ascites, with peritoneal dialysis catheter in RIGHT pelvis. Status post RIGHT nephrectomy. Multiple dense/hemorrhagic cysts LEFT kidney. Electronically Signed   By: Elon Alas M.D.   On: 07/29/2016 22:23   Dg Chest 2 View  Result Date: 07/29/2016 CLINICAL DATA:  Central chest pain, fever and dark stools for 2 days EXAM: CHEST  2 VIEW COMPARISON:  09/23/2015 FINDINGS: The heart size and mediastinal contours are within normal limits. Both lungs are clear. The visualized skeletal structures are unremarkable. IMPRESSION: No active cardiopulmonary disease. Electronically Signed   By: Kathreen Devoid   On: 07/29/2016 18:41   Dg Knee Complete 4 Views Left  Result Date: 07/29/2016 CLINICAL DATA:  Central chest pain, fever.  Bilateral knee pain. EXAM: RIGHT KNEE - COMPLETE 4+ VIEW; LEFT KNEE - COMPLETE 4+ VIEW COMPARISON:  None. FINDINGS: RIGHT KNEE: No  evidence of fracture, dislocation, or joint effusion. No evidence of arthropathy or other focal bone abnormality. Soft tissues are unremarkable. LEFT KNEE: No evidence of fracture, dislocation, or joint effusion. No evidence of arthropathy or other focal bone abnormality. Soft tissues are unremarkable. IMPRESSION: 1.  No acute osseous injury of the right knee. 2.  No acute osseous injury of the left knee. Electronically Signed   By: Kathreen Devoid   On: 07/29/2016 18:42   Dg Knee Complete 4 Views Right  Result Date: 07/29/2016 CLINICAL DATA:  Central chest pain, fever.  Bilateral knee pain. EXAM: RIGHT KNEE - COMPLETE 4+ VIEW; LEFT KNEE - COMPLETE 4+ VIEW COMPARISON:  None. FINDINGS: RIGHT KNEE: No evidence of fracture, dislocation, or joint effusion. No evidence of arthropathy or other focal bone abnormality. Soft tissues are unremarkable. LEFT KNEE: No evidence of fracture, dislocation, or joint effusion. No evidence of arthropathy or other focal bone abnormality. Soft tissues are unremarkable. IMPRESSION: 1.  No acute  osseous injury of the right knee. 2.  No acute osseous injury of the left knee. Electronically Signed   By: Kathreen Devoid   On: 07/29/2016 18:42     Medications:  Scheduled: . sodium chloride  10 mL/hr Intravenous Once  . calcitRIOL  0.25 mcg Oral TID  . calcium acetate  2,001 mg Oral TID WC  . cinacalcet  60 mg Oral Q M,W,F  . dialysis solution 1.5% low-MG/low-CA  10 L Intraperitoneal Q24H  . gentamicin cream  1 application Topical Daily  . pantoprazole  40 mg Oral BID  . piperacillin-tazobactam (ZOSYN)  IV  2.25 g Intravenous Q12H  . sodium bicarbonate  650 mg Oral BID  . sodium chloride flush  3 mL Intravenous Q12H  . sodium chloride flush  3 mL Intravenous Q12H   Continuous: . sodium chloride 20 mL/hr at 07/31/16 0620   PXT:GGYIRS chloride, acetaminophen **OR** acetaminophen, calcium acetate, heparin, HYDROcodone-acetaminophen, ondansetron **OR** ondansetron (ZOFRAN) IV, sodium chloride flush  Assessment/Plan:  Active Problems:   Right renal mass   Hypertension   Anemia   ESRD on dialysis [facility])   Essential hypertension   Symptomatic anemia    Syncope in the setting of GI bleed with symptomatic anemia Patient was also experiencing loose stools at home. She could've become dehydrated as well. Vital signs are all stable now. Patient has been transfused PRBCs. Echocardiogram does not show any systolic dysfunction or any significant valvular abnormalities.  Acute blood loss anemia Patient had a significant drop in hemoglobin from her baseline. This is most likely secondary to GI bleed. Patient denies any history of peptic ulcer disease. No other source of bleeding identified. She has been transfused 3 units of PRBCs. Hemoglobin has responded. Slight downward drift noted today. Continue to monitor for now.  GI bleed. Her presentation raises concern for upper GI bleed. Patient denies any history of peptic ulcer disease. She's never had GI bleed in the past. She is on a PPI  infusion currently. Continue for now. Await GI input. She will likely need to undergo endoscopy.  Acute diarrhea Possible infectious etiology. GI pathogen panel is pending. CT scan of the abdomen did not show any acute findings.  Chest pain She did have some EKG changes which is thought to be secondary to tachycardia. Troponin is normal. Denies any chest pain this morning. Echocardiogram has been ordered and is as above. No wall motion abnormalities noted. No need for any further workup at this time.  End-stage renal disease on pericardial  dialysis. Management per nephrology.  Questionable SIRS No obvious source of infection has been found. Cultures are negative so far. Due to her presenting signs and symptoms she was placed on broad-spectrum antibiotics. Influenza of PCR is negative. Lactic acid level normal. Stop IV antibiotics and monitor.  Essential hypertension Home medications are being held due to initial hypotension. Continue to monitor blood pressures closely. Blood pressure has improved and actually now in the hypertensive range. Okay to resume her home medications.  History of Right renal mass She is status post resection. Currently stable. CT scan does show multiple left renal cysts. These were seen on previous imaging studies as well.  DVT Prophylaxis: SCDs    Code Status: Full code  Family Communication: Discussed with patient  Disposition Plan: Stop antibiotics. Mobilize. If hemoglobin remains stable, possible discharge tomorrow.   LOS: 2 days   [facility] Pager [phone] 07/31/2016, 7:54 AM  If 7PM-7AM, please contact night-coverage at [email or web address], password [facility]

## 2016-08-01 DIAGNOSIS — K259 Gastric ulcer, unspecified as acute or chronic, without hemorrhage or perforation: Secondary | ICD-10-CM

## 2016-08-01 LAB — BASIC METABOLIC PANEL
Anion gap: 8 (ref 5–15)
BUN: 82 mg/dL — AB (ref 6–20)
CALCIUM: 8.2 mg/dL — AB (ref 8.9–10.3)
CHLORIDE: 109 mmol/L (ref 101–111)
CO2: 24 mmol/L (ref 22–32)
CREATININE: 10.08 mg/dL — AB (ref 0.44–1.00)
GFR calc non Af Amer: 4 mL/min — ABNORMAL LOW (ref >60)
GFR, EST AFRICAN AMERICAN: 5 mL/min — AB (ref >60)
GLUCOSE: 107 mg/dL — AB (ref 65–99)
Potassium: 4 mmol/L (ref 3.5–5.1)
Sodium: 141 mmol/L (ref 135–145)

## 2016-08-01 LAB — CBC
HCT: 28.1 % — ABNORMAL LOW (ref 36.0–46.0)
HEMATOCRIT: 22.3 % — AB (ref 36.0–46.0)
Hemoglobin: 7.1 g/dL — ABNORMAL LOW (ref 12.0–15.0)
Hemoglobin: 9.1 g/dL — ABNORMAL LOW (ref 12.0–15.0)
MCH: 30.7 pg (ref 26.0–34.0)
MCH: 27.9 pg (ref 26.0–34.0)
MCHC: 31.8 g/dL (ref 30.0–36.0)
MCHC: 32.4 g/dL (ref 30.0–36.0)
MCV: 96.5 fL (ref 78.0–100.0)
MCV: 86.2 fL (ref 78.0–100.0)
Platelets: 175 10*3/uL (ref 150–400)
Platelets: 150 10*3/uL (ref 150–400)
RBC: 2.31 MIL/uL — ABNORMAL LOW (ref 3.87–5.11)
RBC: 3.26 MIL/uL — ABNORMAL LOW (ref 3.87–5.11)
RDW: 16.4 % — AB (ref 11.5–15.5)
RDW: 19.2 % — AB (ref 11.5–15.5)
WBC: 10.1 10*3/uL (ref 4.0–10.5)
WBC: 12 10*3/uL — AB (ref 4.0–10.5)

## 2016-08-01 LAB — PREPARE RBC (CROSSMATCH)

## 2016-08-01 MED ORDER — SODIUM CHLORIDE 0.9 % IV SOLN
Freq: Once | INTRAVENOUS | Status: AC
  Administered 2016-08-01: 14:00:00 via INTRAVENOUS

## 2016-08-01 MED ORDER — SODIUM CHLORIDE 0.9 % IV SOLN
Freq: Once | INTRAVENOUS | Status: AC
  Administered 2016-08-01: 10:00:00 via INTRAVENOUS

## 2016-08-01 NOTE — Progress Notes (Signed)
[facility] PROGRESS NOTE  Terry Juarez NKN:[id number] DOB: 27-Aug-1965 DOA: 07/29/2016  PCP: [name], MD  Brief History/Interval Summary: 51 year old female with a past medical history of end-stage renal disease on peritoneal dialysis, hypertension, anemia, history of renal cell carcinoma status post nephrectomy, who presented after sustaining multiple episodes of syncope. Patient had been sick for the past few days or with diarrhea consisting of dark tarry stools. Patient was found to be significantly anemic with a hemoglobin of 4. She had heme positive stool. She was hospitalized for further management. There was also reports of fever, chills.  Reason for Visit: Blood loss anemia. GI bleed.  Consultants: Gastroenterology. Nephrology.  Procedures:  Peritoneal dialysis  Transthoracic echocardiogram Study Conclusions  - Left ventricle: The cavity size was normal. Systolic function was   normal. The estimated ejection fraction was in the range of 60%   to 65%. Wall motion was normal; there were no regional wall   motion abnormalities. - Mitral valve: There was mild regurgitation.  EGD 1/25 Impression:     - Normal esophagus. - Non-bleeding gastric ulcers with no stigmata of bleeding. Biopsied. - Normal examined duodenum.  Antibiotics: Vancomycin and Zosyn stopped on 1/26  Subjective/Interval History: Patient feels well. Denies any complaints. She did have a BM yesterday, which was also black in color. Denies any right red blood per rectum. Denies any nausea, vomiting.   ROS: Denies any chest pain or shortness of breath  Objective:  Vital Signs  Vitals:   07/31/16 2142 07/31/16 2300 08/01/16 0000 08/01/16 0300  BP: (!) 157/61 (!) 145/61  (!) 128/58  Pulse:  84 87 79  Resp:  14 16 13   Temp:  98.1 F (36.7 C)  98.1 F (36.7 C)  TempSrc:  Oral  Oral  SpO2:  98% 97% 99%  Weight:    54.8 kg (120 lb 13 oz)  Height:        Intake/Output Summary  (Last 24 hours) at 08/01/16 0824 Last data filed at 07/31/16 1954  Gross per 24 hour  Intake              175 ml  Output               75 ml  Net              100 ml   Filed Weights   07/30/16 0000 07/31/16 0600 08/01/16 0300  Weight: 51.6 kg (113 lb 12.1 oz) 52.4 kg (115 lb 8.3 oz) 54.8 kg (120 lb 13 oz)    General appearance: alert, cooperative, appears stated age and no distress Resp: Coarse breath sounds bilaterally. Diminished air entry at the bases. No definite crackles or wheezing. Cardio: regular rate and rhythm, S1, S2 normal, no murmur, click, rub or gallop GI: soft, non-tender; bowel sounds normal; no masses,  no organomegaly Neurologic: Awake and alert. Oriented 3. No focal neurological deficits.  Lab Results:  Data Reviewed: I have personally reviewed following labs and imaging studies  CBC:  Recent Labs Lab 07/29/16 1725  07/30/16 1525 07/30/16 2249 07/31/16 0450 07/31/16 1417 08/01/16 0512  WBC 18.9*  < > 10.9* 10.0 8.5 9.3 10.1  NEUTROABS 16.3*  --   --   --   --   --   --   HGB 4.0*  < > 8.6* 8.3* 8.0* 8.2* 7.1*  HCT 12.6*  < > 25.9* 25.5* 24.2* 24.8* 22.3*  MCV 102.4*  < > 92.5 93.4 93.8 95.0 96.5  PLT 244  < > 160 161 140* 157 175  < > = values in this interval not displayed.  Basic Metabolic Panel:  Recent Labs Lab 07/29/16 1614 07/30/16 0522 08/01/16 0512  NA 142 139 141  K 5.1 4.5 4.0  CL 105 105 109  CO2 20* 17* 24  GLUCOSE 106* 104* 107*  BUN 123* 114* 82*  CREATININE 11.71* 11.46* 10.08*  CALCIUM 7.8* 7.5* 8.2*  MG  --  2.1  --   PHOS  --  5.9*  --     GFR: Estimated Creatinine Clearance: 5.7 mL/min (by C-G formula based on SCr of 10.08 mg/dL (H)).  Liver Function Tests:  Recent Labs Lab 07/29/16 1614 07/30/16 0522  AST 14* 14*  ALT 10* 9*  ALKPHOS 18* 15*  BILITOT 0.5 0.5  PROT 4.4* 4.1*  ALBUMIN 2.6* 2.3*    Coagulation Profile:  Recent Labs Lab 07/29/16 1725  INR 1.14    Cardiac Enzymes:  Recent  Labs Lab 07/29/16 2028 07/30/16 0533 07/30/16 1038 07/30/16 1904  TROPONINI <0.03 <0.03 <0.03 <0.03    CBG:  Recent Labs Lab 07/30/16 0017  GLUCAP 117*    Thyroid Function Tests:  Recent Labs  07/30/16 0522  TSH 0.549     Recent Results (from the past 240 hour(s))  Gastrointestinal Panel by PCR , Stool     Status: None   Collection Time: 07/29/16 11:32 PM  Result Value Ref Range Status   Campylobacter species NOT DETECTED NOT DETECTED Final   Plesimonas shigelloides NOT DETECTED NOT DETECTED Final   Salmonella species NOT DETECTED NOT DETECTED Final   Yersinia enterocolitica NOT DETECTED NOT DETECTED Final   Vibrio species NOT DETECTED NOT DETECTED Final   Vibrio cholerae NOT DETECTED NOT DETECTED Final   Enteroaggregative E coli (EAEC) NOT DETECTED NOT DETECTED Final   Enteropathogenic E coli (EPEC) NOT DETECTED NOT DETECTED Final   Enterotoxigenic E coli (ETEC) NOT DETECTED NOT DETECTED Final   Shiga like toxin producing E coli (STEC) NOT DETECTED NOT DETECTED Final   Shigella/Enteroinvasive E coli (EIEC) NOT DETECTED NOT DETECTED Final   Cryptosporidium NOT DETECTED NOT DETECTED Final   Cyclospora cayetanensis NOT DETECTED NOT DETECTED Final   Entamoeba histolytica NOT DETECTED NOT DETECTED Final   Giardia lamblia NOT DETECTED NOT DETECTED Final   Adenovirus F40/41 NOT DETECTED NOT DETECTED Final   Astrovirus NOT DETECTED NOT DETECTED Final   Norovirus GI/GII NOT DETECTED NOT DETECTED Final   Rotavirus A NOT DETECTED NOT DETECTED Final   Sapovirus (I, II, IV, and V) NOT DETECTED NOT DETECTED Final  MRSA PCR Screening     Status: None   Collection Time: 07/30/16 12:37 AM  Result Value Ref Range Status   MRSA by PCR NEGATIVE NEGATIVE Final    Comment:        The GeneXpert MRSA Assay (FDA approved for NASAL specimens only), is one component of a comprehensive MRSA colonization surveillance program. It is not intended to diagnose MRSA infection nor to guide  or monitor treatment for MRSA infections.   Culture, blood (Routine X 2) w Reflex to ID Panel     Status: None (Preliminary result)   Collection Time: 07/30/16  5:30 AM  Result Value Ref Range Status   Specimen Description BLOOD LEFT HAND  Final   Special Requests BOTTLES DRAWN AEROBIC ONLY 5CC  Final   Culture NO GROWTH 1 DAY  Final   Report Status PENDING  Incomplete  Culture, blood (Routine X  2) w Reflex to ID Panel     Status: None (Preliminary result)   Collection Time: 07/30/16  5:37 AM  Result Value Ref Range Status   Specimen Description BLOOD LEFT HAND  Final   Special Requests BOTTLES DRAWN AEROBIC ONLY 5CC  Final   Culture NO GROWTH 1 DAY  Final   Report Status PENDING  Incomplete      Radiology Studies: No results found.   Medications:  Scheduled: . sodium chloride  10 mL/hr Intravenous Once  . amLODipine  10 mg Oral QHS  . calcitRIOL  0.25 mcg Oral TID  . calcium acetate  2,001 mg Oral TID WC  . cinacalcet  60 mg Oral Q M,W,F  . dialysis solution 1.5% low-MG/low-CA  10 L Intraperitoneal Q24H  . gentamicin cream  1 application Topical Daily  . lisinopril  20 mg Oral Daily  . pantoprazole  40 mg Oral BID  . sodium bicarbonate  650 mg Oral BID  . sodium chloride flush  3 mL Intravenous Q12H  . sodium chloride flush  3 mL Intravenous Q12H   Continuous: . sodium chloride 20 mL/hr at 07/31/16 0620   DTO:IZTIWP chloride, acetaminophen **OR** acetaminophen, calcium acetate, heparin, HYDROcodone-acetaminophen, ondansetron **OR** ondansetron (ZOFRAN) IV, sodium chloride flush  Assessment/Plan:  Active Problems:   Right renal mass   Hypertension   Anemia   ESRD on dialysis [facility])   Essential hypertension   Symptomatic anemia    Syncope in the setting of GI bleed with symptomatic anemia Patient was also experiencing loose stools at home. She could've become dehydrated as well. Vital signs are all stable now. Patient has been transfused PRBCs. Echocardiogram  does not show any systolic dysfunction or any significant valvular abnormalities.No further episodes of syncope.  Acute blood loss anemia Patient had a significant drop in hemoglobin from her baseline. This is most likely secondary to GI bleed. No other source of bleeding identified. She was transfused 3 units of PRBCs. Hemoglobin did respond. Hemoglobin has drifted down this morning to 7. She had one episode of loose stool yesterday which was black in color. This could still be old blood. She warrants further observation in the hospital to make sure her hemoglobin remains stable. 2 more units of blood transfusion ordered by nephrology.   GI bleed Her presentation raised concern for upper GI bleed. Patient denied any history of peptic ulcer disease. She's never had GI bleed in the past. Patient was started on a PPI infusion. She was seen by gastroenterology and underwent EGD which revealed nonbleeding gastric ulcers. Now on twice a day PPI. Downward drift in hemoglobin noted. Monitor for an additional day.   Acute diarrhea Possible infectious etiology. GI pathogen panel is negative. CT scan of the abdomen did not show any acute findings. Seems to be resolving.  Chest pain She did have some EKG changes which is thought to be secondary to tachycardia. Troponin is normal. No further episodes of chest pain. Echocardiogram was done and report is as above. No wall motion abnormalities noted. No need for any further workup at this time.  End-stage renal disease on pericardial dialysis. Management per nephrology.  Questionable SIRS No obvious source of infection has been found. Cultures are negative so far. Due to her presenting signs and symptoms she was placed on broad-spectrum antibiotics. Influenza of PCR is negative. Lactic acid level normal. IV antibiotics were discontinued. She remains afebrile.   Essential hypertension Home medications were held due to initial hypotension. Blood pressure has  improved and actually now in the hypertensive range. Home medications were resumed.  History of Right renal mass She is status post resection. Currently stable. CT scan does show multiple left renal cysts. These were seen on previous imaging studies as well.  DVT Prophylaxis: SCDs    Code Status: Full code  Family Communication: Discussed with patient  Disposition Plan: Patient to be transfused today. Want to be make sure her hemoglobin remains stable. Possible discharge tomorrow.    LOS: 3 days   [facility] Pager [phone] 08/01/2016, 8:24 AM  If 7PM-7AM, please contact night-coverage at [email or web address], password [facility]

## 2016-08-01 NOTE — Progress Notes (Signed)
  [facility] Progress Note   Subjective: Hb down 7.0 today, no gross bloody stool. Had watery BM with some blackish material, no diff than prior BM per pt.   Vitals:   07/31/16 2300 08/01/16 0000 08/01/16 0300 08/01/16 0845  BP: (!) 145/61  (!) 128/58 (!) 130/47  Pulse: 84 87 79 88  Resp: 14 16 13 14   Temp: 98.1 F (36.7 C)  98.1 F (36.7 C) 97.8 F (36.6 C)  TempSrc: Oral  Oral Oral  SpO2: 98% 97% 99%   Weight:   54.8 kg (120 lb 13 oz)   Height:        Inpatient medications: . sodium chloride  10 mL/hr Intravenous Once  . sodium chloride   Intravenous Once  . amLODipine  10 mg Oral QHS  . calcitRIOL  0.25 mcg Oral TID  . calcium acetate  2,001 mg Oral TID WC  . cinacalcet  60 mg Oral Q M,W,F  . dialysis solution 1.5% low-MG/low-CA  10 L Intraperitoneal Q24H  . gentamicin cream  1 application Topical Daily  . lisinopril  20 mg Oral Daily  . pantoprazole  40 mg Oral BID  . sodium bicarbonate  650 mg Oral BID  . sodium chloride flush  3 mL Intravenous Q12H  . sodium chloride flush  3 mL Intravenous Q12H   . sodium chloride 20 mL/hr at 07/31/16 0620   sodium chloride, acetaminophen **OR** acetaminophen, calcium acetate, heparin, HYDROcodone-acetaminophen, ondansetron **OR** ondansetron (ZOFRAN) IV, sodium chloride flush  Exam: Alert, thin AAF, no distress  no jvd Chest clear bialt RRR +2/6 sem, no rg Abd soft ntnd PD cath inplace Ext no edema NF, ox3  Dialysis: on PD, 5 exchanges overnight , no day fluid, no pause      Assessment: 1. Anemia/ GIB - sp EGD, +gastric ulcers, on PPI 2 mos.  Hb down today 2. ESRD on PD 3. Vol is stable on exam 4. HTN bp's good 5. Dispo - ok for dc from renal standpoint   Plan - transfuse 2u prbc's, orders written. Cont CCPD while here, all 1.5%   [name] [facility] pager [phone]   08/01/2016, 10:30 AM    Recent Labs Lab 07/29/16 1614 07/30/16 0522 08/01/16 0512  NA 142 139 141   K 5.1 4.5 4.0  CL 105 105 109  CO2 20* 17* 24  GLUCOSE 106* 104* 107*  BUN 123* 114* 82*  CREATININE 11.71* 11.46* 10.08*  CALCIUM 7.8* 7.5* 8.2*  PHOS  --  5.9*  --     Recent Labs Lab 07/29/16 1614 07/30/16 0522  AST 14* 14*  ALT 10* 9*  ALKPHOS 18* 15*  BILITOT 0.5 0.5  PROT 4.4* 4.1*  ALBUMIN 2.6* 2.3*    Recent Labs Lab 07/29/16 1725  07/31/16 0450 07/31/16 1417 08/01/16 0512  WBC 18.9*  < > 8.5 9.3 10.1  NEUTROABS 16.3*  --   --   --   --   HGB 4.0*  < > 8.0* 8.2* 7.1*  HCT 12.6*  < > 24.2* 24.8* 22.3*  MCV 102.4*  < > 93.8 95.0 96.5  PLT 244  < > 140* 157 175  < > = values in this interval not displayed. Iron/TIBC/Ferritin/ %Sat No results found for: IRON, TIBC, FERRITIN, IRONPCTSAT

## 2016-08-02 DIAGNOSIS — D649 Anemia, unspecified: Secondary | ICD-10-CM

## 2016-08-02 LAB — TYPE AND SCREEN
ABO/RH(D): O POS
ANTIBODY SCREEN: NEGATIVE
UNIT DIVISION: 0
Unit division: 0
Unit division: 0
Unit division: 0
Unit division: 0

## 2016-08-02 LAB — CBC
HCT: 25.3 % — ABNORMAL LOW (ref 36.0–46.0)
HEMATOCRIT: 26.4 % — AB (ref 36.0–46.0)
Hemoglobin: 8.8 g/dL — ABNORMAL LOW (ref 12.0–15.0)
Hemoglobin: 8.5 g/dL — ABNORMAL LOW (ref 12.0–15.0)
MCH: 28.6 pg (ref 26.0–34.0)
MCH: 29.1 pg (ref 26.0–34.0)
MCHC: 33.3 g/dL (ref 30.0–36.0)
MCHC: 33.6 g/dL (ref 30.0–36.0)
MCV: 85.7 fL (ref 78.0–100.0)
MCV: 86.6 fL (ref 78.0–100.0)
PLATELETS: 155 10*3/uL (ref 150–400)
PLATELETS: 157 10*3/uL (ref 150–400)
RBC: 3.08 MIL/uL — ABNORMAL LOW (ref 3.87–5.11)
RBC: 2.92 MIL/uL — ABNORMAL LOW (ref 3.87–5.11)
RDW: 19.8 % — AB (ref 11.5–15.5)
RDW: 20.2 % — ABNORMAL HIGH (ref 11.5–15.5)
WBC: 10.9 10*3/uL — AB (ref 4.0–10.5)
WBC: 10 10*3/uL (ref 4.0–10.5)

## 2016-08-02 MED ORDER — PANTOPRAZOLE SODIUM 40 MG PO TBEC: 40.0000 mg | DELAYED_RELEASE_TABLET | Freq: Two times a day (BID) | ORAL | 1 refills | Status: DC

## 2016-08-02 NOTE — Discharge Instructions (Signed)
Gastrointestinal Bleeding °Introduction °Gastrointestinal bleeding is bleeding somewhere along the path food travels through the body (digestive tract). This path is anywhere between the mouth and the opening of the butt (anus). You may have blood in your poop (stools) or have black poop. If you throw up (vomit), there may be blood in it. °This condition can be mild, serious, or even life-threatening. If you have a lot of bleeding, you may need to stay in the hospital. °Follow these instructions at home: °· Take over-the-counter and prescription medicines only as told by your doctor. °· Eat foods that have a lot of fiber in them. These foods include whole grains, fruits, and vegetables. You can also try eating 1-3 prunes each day. °· Drink enough fluid to keep your pee (urine) clear or pale yellow. °· Keep all follow-up visits as told by your doctor. This is important. °Contact a doctor if: °· Your symptoms do not get better. °Get help right away if: °· Your bleeding gets worse. °· You feel dizzy or you pass out (faint). °· You feel weak. °· You have very bad cramps in your back or belly (abdomen). °· You pass large clumps of blood (clots) in your poop. °· Your symptoms are getting worse. °This information is not intended to replace advice given to you by your health care provider. Make sure you discuss any questions you have with your health care provider. °Document Released: 03/31/2008 Document Revised: 11/28/2015 Document Reviewed: 12/10/2014 °© 2017 [employer] ° °

## 2016-08-02 NOTE — Discharge Summary (Signed)
[facility]  Physician Discharge Summary   Patient ID: Terry Juarez MRN: [id number] DOB/AGE: [id number] y.o.  Admit date: 07/29/2016 Discharge date: 08/02/2016  PCP: [name], MD  DISCHARGE DIAGNOSES:  Active Problems:   Right renal mass   Hypertension   Anemia   ESRD on dialysis [facility])   Essential hypertension   Symptomatic anemia   RECOMMENDATIONS FOR OUTPATIENT FOLLOW UP: 1. Patient instructed to follow-up with her primary care provider for blood work in the next 3-5 days to check hemoglobin  DISCHARGE CONDITION: fair  Diet recommendation: As before  Filed Weights   07/31/16 0600 08/01/16 0300 08/02/16 0300  Weight: 52.4 kg (115 lb 8.3 oz) 54.8 kg (120 lb 13 oz) 56.9 kg (125 lb 7.1 oz)    INITIAL HISTORY: 51 year old female with a past medical history of end-stage renal disease on peritoneal dialysis, hypertension, anemia, history of renal cell carcinoma status post nephrectomy, who presented after sustaining multiple episodes of syncope. Patient had been sick for the past few days or with diarrhea consisting of dark tarry stools. Patient was found to be significantly anemic with a hemoglobin of 4. She had heme positive stool. She was hospitalized for further management. There was also reports of fever, chills.  Consultants: Gastroenterology. Nephrology.  Procedures:  Peritoneal dialysis  Transthoracic echocardiogram Study Conclusions  - Left ventricle: The cavity size was normal. Systolic function was normal. The estimated ejection fraction was in the range of 60% to 65%. Wall motion was normal; there were no regional wall motion abnormalities. - Mitral valve: There was mild regurgitation.  EGD 1/25 Impression:  - Normal esophagus. - Non-bleeding gastric ulcers with no stigmata of bleeding. Biopsied. - Normal examined duodenum.   HOSPITAL COURSE:   Syncope in the setting of GI bleed with symptomatic anemia Patient  presented with syncopal episodes. She was experiencing loose stools at home. These were melanotic. Patient was found to be profoundly anemic. Syncope was a result of hypovolemia and severe anemia. Echocardiogram did not show any systolic dysfunction or any significant valvular abnormalities.   Acute blood loss anemia Patient had a significant drop in hemoglobin from her baseline. This was most likely secondary to GI bleed. No other source of bleeding identified. She was transfused 3 units of PRBCs. Hemoglobin did respond. Hemoglobin again started drifting down. She was transfused an additional 2 units of PRBC. Hemoglobin has been stable for the last 24 hours. She has been told to have a hemoglobin checked this week by her primary care provider.   Melena/GI bleed Patient was experiencing melanotic stool and was noted to be profoundly anemic. Her presentation raised concern for upper GI bleed. Patient denied any history of peptic ulcer disease. She's never had GI bleed in the past. Patient was started on a PPI infusion. She was seen by gastroenterology and underwent EGD which revealed nonbleeding gastric ulcers. Now on twice a day PPI. Patient did have episodes of black stool but the frequency was decreasing. Quite possible that this is old blood that is coming out. Patient had significantly improved from a symptom standpoint. Hemoglobin has been stable for the last 24 hours. It is reasonable for the patient to go home. She has been told that if her black stool persists, she should call gastroenterologist, Dr. Benson Norway. On the other hand, if she feels poorly, she should seek attention immediately by coming to the emergency department.   Acute diarrhea Possible infectious etiology. GI pathogen panel is negative. CT scan of the abdomen  did not show any acute findings. Seems to be resolving.  Chest pain She did have some EKG changes which is thought to be secondary to tachycardia. Troponin is normal. No  further episodes of chest pain. Echocardiogram was done and report is as above. No wall motion abnormalities noted. No need for any further workup at this time.  End-stage renal disease on pericardial dialysis. Patient was seen by nephrology. Maintained on pericardial dialysis in the hospital.  Questionable SIRS Patient presented with tachycardia, high respiratory rate, high WBC. This raised concern for SIRS. She was empirically started on broad-spectrum antibiotics. No obvious source of infection has been found. Cultures are negative so far. Influenza PCR is negative. Lactic acid level normal. IV antibiotics were discontinued. She remains afebrile. WBC is normal.  Essential hypertension Home medications were held due to initial hypotension. Blood pressure has improved and actually now in the hypertensive range. Home medications were resumed.  History of Right renal mass She is status post resection. Currently stable. CT scan does show multiple left renal cysts. These were seen on previous imaging studies as well.  Overall, stable. Okay for discharge home today.   PERTINENT LABS:  The results of significant diagnostics from this hospitalization (including imaging, microbiology, ancillary and laboratory) are listed below for reference.    Microbiology: Recent Results (from the past 240 hour(s))  Gastrointestinal Panel by PCR , Stool     Status: None   Collection Time: 07/29/16 11:32 PM  Result Value Ref Range Status   Campylobacter species NOT DETECTED NOT DETECTED Final   Plesimonas shigelloides NOT DETECTED NOT DETECTED Final   Salmonella species NOT DETECTED NOT DETECTED Final   Yersinia enterocolitica NOT DETECTED NOT DETECTED Final   Vibrio species NOT DETECTED NOT DETECTED Final   Vibrio cholerae NOT DETECTED NOT DETECTED Final   Enteroaggregative E coli (EAEC) NOT DETECTED NOT DETECTED Final   Enteropathogenic E coli (EPEC) NOT DETECTED NOT DETECTED Final    Enterotoxigenic E coli (ETEC) NOT DETECTED NOT DETECTED Final   Shiga like toxin producing E coli (STEC) NOT DETECTED NOT DETECTED Final   Shigella/Enteroinvasive E coli (EIEC) NOT DETECTED NOT DETECTED Final   Cryptosporidium NOT DETECTED NOT DETECTED Final   Cyclospora cayetanensis NOT DETECTED NOT DETECTED Final   Entamoeba histolytica NOT DETECTED NOT DETECTED Final   Giardia lamblia NOT DETECTED NOT DETECTED Final   Adenovirus F40/41 NOT DETECTED NOT DETECTED Final   Astrovirus NOT DETECTED NOT DETECTED Final   Norovirus GI/GII NOT DETECTED NOT DETECTED Final   Rotavirus A NOT DETECTED NOT DETECTED Final   Sapovirus (I, II, IV, and V) NOT DETECTED NOT DETECTED Final  MRSA PCR Screening     Status: None   Collection Time: 07/30/16 12:37 AM  Result Value Ref Range Status   MRSA by PCR NEGATIVE NEGATIVE Final    Comment:        The GeneXpert MRSA Assay (FDA approved for NASAL specimens only), is one component of a comprehensive MRSA colonization surveillance program. It is not intended to diagnose MRSA infection nor to guide or monitor treatment for MRSA infections.   Culture, blood (Routine X 2) w Reflex to ID Panel     Status: None (Preliminary result)   Collection Time: 07/30/16  5:30 AM  Result Value Ref Range Status   Specimen Description BLOOD LEFT HAND  Final   Special Requests BOTTLES DRAWN AEROBIC ONLY 5CC  Final   Culture NO GROWTH 3 DAYS  Final   Report Status  PENDING  Incomplete  Culture, blood (Routine X 2) w Reflex to ID Panel     Status: None (Preliminary result)   Collection Time: 07/30/16  5:37 AM  Result Value Ref Range Status   Specimen Description BLOOD LEFT HAND  Final   Special Requests BOTTLES DRAWN AEROBIC ONLY 5CC  Final   Culture NO GROWTH 3 DAYS  Final   Report Status PENDING  Incomplete     Labs: Basic Metabolic Panel:  Recent Labs Lab 07/29/16 1614 07/30/16 0522 08/01/16 0512  NA 142 139 141  K 5.1 4.5 4.0  CL 105 105 109  CO2 20*  17* 24  GLUCOSE 106* 104* 107*  BUN 123* 114* 82*  CREATININE 11.71* 11.46* 10.08*  CALCIUM 7.8* 7.5* 8.2*  MG  --  2.1  --   PHOS  --  5.9*  --    Liver Function Tests:  Recent Labs Lab 07/29/16 1614 07/30/16 0522  AST 14* 14*  ALT 10* 9*  ALKPHOS 18* 15*  BILITOT 0.5 0.5  PROT 4.4* 4.1*  ALBUMIN 2.6* 2.3*   CBC:  Recent Labs Lab 07/29/16 1725  07/31/16 1417 08/01/16 0512 08/01/16 2137 08/02/16 0304 08/02/16 1430  WBC 18.9*  < > 9.3 10.1 12.0* 10.9* 10.0  NEUTROABS 16.3*  --   --   --   --   --   --   HGB 4.0*  < > 8.2* 7.1* 9.1* 8.8* 8.5*  HCT 12.6*  < > 24.8* 22.3* 28.1* 26.4* 25.3*  MCV 102.4*  < > 95.0 96.5 86.2 85.7 86.6  PLT 244  < > 157 175 150 155 157  < > = values in this interval not displayed. Cardiac Enzymes:  Recent Labs Lab 07/29/16 2028 07/30/16 0533 07/30/16 1038 07/30/16 1904  TROPONINI <0.03 <0.03 <0.03 <0.03    CBG:  Recent Labs Lab 07/30/16 0017  GLUCAP 117*     IMAGING STUDIES Ct Abdomen Pelvis Wo Contrast  Result Date: 07/29/2016 CLINICAL DATA:  GI bleed. History of renal mass, end-stage renal disease on dialysis. EXAM: CT ABDOMEN AND PELVIS WITHOUT CONTRAST TECHNIQUE: Multidetector CT imaging of the abdomen and pelvis was performed following the standard protocol without IV contrast. COMPARISON:  CT abdomen and pelvis with contrast July 08, 2015. FINDINGS: LOWER CHEST: Lingular atelectasis. The visualized heart size is normal. No pericardial effusion. HEPATOBILIARY: Normal. PANCREAS: Normal. SPLEEN: Normal. ADRENALS/URINARY TRACT: Status post RIGHT nephrectomy. Multiple small dense LEFT renal cysts measuring up to 19 mm. Limited assessment for renal masses on this nonenhanced examination. The unopacified ureters are normal in course and caliber. Urinary bladder is well distended and unremarkable. Normal adrenal glands. STOMACH/BOWEL: The stomach, small and large bowel are normal in course and caliber without inflammatory changes,  sensitivity decreased by lack of enteric contrast. Normal appendix. VASCULAR/LYMPHATIC: Aortoiliac vessels are normal in course and caliber, mild calcific atherosclerosis. No lymphadenopathy by CT size criteria. REPRODUCTIVE: Normal. OTHER: Minimal intraperitoneal gas with peritoneal dialysis catheter terminating in the RIGHT pelvis. Small amount of ascites. MUSCULOSKELETAL: Non-acute. IMPRESSION: No acute intra-abdominal or pelvic process by noncontrast CT. Small amount of pneumoperitoneum and ascites, with peritoneal dialysis catheter in RIGHT pelvis. Status post RIGHT nephrectomy. Multiple dense/hemorrhagic cysts LEFT kidney. Electronically Signed   By: Elon Alas M.D.   On: 07/29/2016 22:23   Dg Chest 2 View  Result Date: 07/29/2016 CLINICAL DATA:  Central chest pain, fever and dark stools for 2 days EXAM: CHEST  2 VIEW COMPARISON:  09/23/2015 FINDINGS: The heart  size and mediastinal contours are within normal limits. Both lungs are clear. The visualized skeletal structures are unremarkable. IMPRESSION: No active cardiopulmonary disease. Electronically Signed   By: Kathreen Devoid   On: 07/29/2016 18:41   Dg Knee Complete 4 Views Left  Result Date: 07/29/2016 CLINICAL DATA:  Central chest pain, fever.  Bilateral knee pain. EXAM: RIGHT KNEE - COMPLETE 4+ VIEW; LEFT KNEE - COMPLETE 4+ VIEW COMPARISON:  None. FINDINGS: RIGHT KNEE: No evidence of fracture, dislocation, or joint effusion. No evidence of arthropathy or other focal bone abnormality. Soft tissues are unremarkable. LEFT KNEE: No evidence of fracture, dislocation, or joint effusion. No evidence of arthropathy or other focal bone abnormality. Soft tissues are unremarkable. IMPRESSION: 1.  No acute osseous injury of the right knee. 2.  No acute osseous injury of the left knee. Electronically Signed   By: Kathreen Devoid   On: 07/29/2016 18:42   Dg Knee Complete 4 Views Right  Result Date: 07/29/2016 CLINICAL DATA:  Central chest pain, fever.   Bilateral knee pain. EXAM: RIGHT KNEE - COMPLETE 4+ VIEW; LEFT KNEE - COMPLETE 4+ VIEW COMPARISON:  None. FINDINGS: RIGHT KNEE: No evidence of fracture, dislocation, or joint effusion. No evidence of arthropathy or other focal bone abnormality. Soft tissues are unremarkable. LEFT KNEE: No evidence of fracture, dislocation, or joint effusion. No evidence of arthropathy or other focal bone abnormality. Soft tissues are unremarkable. IMPRESSION: 1.  No acute osseous injury of the right knee. 2.  No acute osseous injury of the left knee. Electronically Signed   By: Kathreen Devoid   On: 07/29/2016 18:42    DISCHARGE EXAMINATION: Vitals:   08/02/16 0400 08/02/16 0816 08/02/16 1336 08/02/16 1647  BP:  (!) 145/70 (!) 150/49   Pulse: 72 81 85 94  Resp: 14 13 17 18   Temp:  98.1 F (36.7 C) 98.1 F (36.7 C) 99 F (37.2 C)  TempSrc:  Oral Oral Oral  SpO2: 100%  100%   Weight:      Height:       General appearance: alert, cooperative, appears stated age and no distress Resp: clear to auscultation bilaterally Cardio: regular rate and rhythm, S1, S2 normal, no murmur, click, rub or gallop GI: soft, non-tender; bowel sounds normal; no masses,  no organomegaly and with PD catheter Extremities: extremities normal, atraumatic, no cyanosis or edema  DISPOSITION: Home  Discharge Instructions    Call MD for:  extreme fatigue    Complete by:  As directed    Call MD for:  persistant dizziness or light-headedness    Complete by:  As directed    Call MD for:  persistant nausea and vomiting    Complete by:  As directed    Call MD for:  severe uncontrolled pain    Complete by:  As directed    Call MD for:  temperature >100.4    Complete by:  As directed    Diet - low sodium heart healthy    Complete by:  As directed    Discharge instructions    Complete by:  As directed    Please monitor your bowel movements. If black stools persist, please call Dr. Ulyses Amor office. If you feel weak, fatigued and started  having abdominal pain, so feel dizzy or lightheaded. Please come back to the emergency department.  You were cared for by a hospitalist during your hospital stay. If you have any questions about your discharge medications or the care you received while you  were in the hospital after you are discharged, you can call the unit and asked to speak with the hospitalist on call if the hospitalist that took care of you is not available. Once you are discharged, your primary care physician will handle any further medical issues. Please note that NO REFILLS for any discharge medications will be authorized once you are discharged, as it is imperative that you return to your primary care physician (or establish a relationship with a primary care physician if you do not have one) for your aftercare needs so that they can reassess your need for medications and monitor your lab values. If you do not have a primary care physician, you can call [phone] for a physician referral.   Increase activity slowly    Complete by:  As directed       ALLERGIES: No Known Allergies   Current Discharge Medication List    START taking these medications   Details  pantoprazole (PROTONIX) 40 MG tablet Take 1 tablet (40 mg total) by mouth 2 (two) times daily. Qty: 60 tablet, Refills: 1      CONTINUE these medications which have NOT CHANGED   Details  acetaminophen-codeine (TYLENOL #3) 300-30 MG tablet One every 4 hours as needed for cough Qty: 40 tablet, Refills: 0   Associated Diagnoses: Cough    amLODipine (NORVASC) 10 MG tablet Take 10 mg by mouth at bedtime.     calcitRIOL (ROCALTROL) 0.25 MCG capsule Take 0.25 mcg by mouth 3 (three) times daily.     calcium acetate (PHOSLO) 667 MG capsule Take 667-2,001 mg by mouth 3 (three) times daily with meals. Take 2001 mg three times a day with meals  Take 667 mg with snacks twice daily    CONSTULOSE 10 GM/15ML solution Take 10 mLs by mouth daily as needed for  constipation. Refills: 0    lidocaine-prilocaine (EMLA) cream Apply 1 application topically daily.     lisinopril (PRINIVIL,ZESTRIL) 20 MG tablet Take 20 mg by mouth daily.    losartan (COZAAR) 100 MG tablet Take 100 mg by mouth daily. Refills: 2    multivitamin (RENA-VIT) TABS tablet Take 1 tablet by mouth daily.    SENSIPAR 60 MG tablet Take 60 mg by mouth every Monday, Wednesday, and Friday.     sodium bicarbonate 650 MG tablet Take 650 mg by mouth 2 (two) times daily.         Follow-up Information    Beryle Beams, MD. Call.   Specialty:  Gastroenterology Why:  For any further issues or if black stools persist. Contact information: [place], [place] [address] [phone]        [name], MD Follow up.   Specialty:  Internal Medicine Why:  Have your hemoglobin checked within the next 3-5 days. Contact information: [place] [address] [phone]           TOTAL DISCHARGE TIME: 35 mins  [facility] Pager [phone]  08/02/2016, 5:09 PM

## 2016-08-02 NOTE — Progress Notes (Signed)
Discharge instruction and education complete with patient.  Pt verbally acknowledged teaching and education.  No further questions, comments or concerns at this time.  Pt waiting on her ride to come.  Emotional support provided.

## 2016-08-04 LAB — CULTURE, BLOOD (ROUTINE X 2)
Culture: NO GROWTH
Culture: NO GROWTH

## 2016-08-05 DIAGNOSIS — N041 Nephrotic syndrome with focal and segmental glomerular lesions: Secondary | ICD-10-CM | POA: Diagnosis not present

## 2016-08-05 DIAGNOSIS — N186 End stage renal disease: Secondary | ICD-10-CM | POA: Diagnosis not present

## 2016-08-05 DIAGNOSIS — Z992 Dependence on renal dialysis: Secondary | ICD-10-CM | POA: Diagnosis not present

## 2016-08-17 DIAGNOSIS — D509 Iron deficiency anemia, unspecified: Secondary | ICD-10-CM | POA: Diagnosis not present

## 2016-08-17 DIAGNOSIS — K254 Chronic or unspecified gastric ulcer with hemorrhage: Secondary | ICD-10-CM | POA: Diagnosis not present

## 2016-08-17 DIAGNOSIS — Z1211 Encounter for screening for malignant neoplasm of colon: Secondary | ICD-10-CM | POA: Diagnosis not present

## 2016-08-18 ENCOUNTER — Other Ambulatory Visit: Payer: Self-pay | Admitting: Gastroenterology

## 2016-09-02 DIAGNOSIS — N041 Nephrotic syndrome with focal and segmental glomerular lesions: Secondary | ICD-10-CM | POA: Diagnosis not present

## 2016-09-02 DIAGNOSIS — N186 End stage renal disease: Secondary | ICD-10-CM | POA: Diagnosis not present

## 2016-09-02 DIAGNOSIS — Z992 Dependence on renal dialysis: Secondary | ICD-10-CM | POA: Diagnosis not present

## 2016-09-04 ENCOUNTER — Encounter (HOSPITAL_COMMUNITY): Payer: Self-pay | Admitting: *Deleted

## 2016-09-04 ENCOUNTER — Ambulatory Visit (HOSPITAL_COMMUNITY): Payer: BLUE CROSS/BLUE SHIELD | Admitting: Anesthesiology

## 2016-09-04 ENCOUNTER — Ambulatory Visit (HOSPITAL_COMMUNITY)
Admission: RE | Admit: 2016-09-04 | Discharge: 2016-09-04 | Disposition: A | Discharge status: Home / Self Care | Payer: BLUE CROSS/BLUE SHIELD | Source: Ambulatory Visit | Attending: Gastroenterology | Admitting: Gastroenterology

## 2016-09-04 ENCOUNTER — Encounter (HOSPITAL_COMMUNITY)
Admission: RE | Disposition: A | Discharge status: Home / Self Care | Payer: Self-pay | Source: Ambulatory Visit | Attending: Gastroenterology

## 2016-09-04 DIAGNOSIS — N186 End stage renal disease: Secondary | ICD-10-CM | POA: Diagnosis not present

## 2016-09-04 DIAGNOSIS — K2901 Acute gastritis with bleeding: Secondary | ICD-10-CM | POA: Diagnosis not present

## 2016-09-04 DIAGNOSIS — Z1211 Encounter for screening for malignant neoplasm of colon: Secondary | ICD-10-CM | POA: Insufficient documentation

## 2016-09-04 DIAGNOSIS — D649 Anemia, unspecified: Secondary | ICD-10-CM | POA: Diagnosis not present

## 2016-09-04 DIAGNOSIS — I120 Hypertensive chronic kidney disease with stage 5 chronic kidney disease or end stage renal disease: Secondary | ICD-10-CM | POA: Diagnosis not present

## 2016-09-04 DIAGNOSIS — K2970 Gastritis, unspecified, without bleeding: Secondary | ICD-10-CM | POA: Diagnosis not present

## 2016-09-04 DIAGNOSIS — K5730 Diverticulosis of large intestine without perforation or abscess without bleeding: Secondary | ICD-10-CM | POA: Insufficient documentation

## 2016-09-04 DIAGNOSIS — Z992 Dependence on renal dialysis: Secondary | ICD-10-CM | POA: Diagnosis not present

## 2016-09-04 DIAGNOSIS — E785 Hyperlipidemia, unspecified: Secondary | ICD-10-CM | POA: Diagnosis not present

## 2016-09-04 HISTORY — PX: COLONOSCOPY WITH PROPOFOL: SHX5780

## 2016-09-04 HISTORY — PX: ESOPHAGOGASTRODUODENOSCOPY (EGD) WITH PROPOFOL: SHX5813

## 2016-09-04 SURGERY — COLONOSCOPY WITH PROPOFOL
Anesthesia: Monitor Anesthesia Care

## 2016-09-04 MED ORDER — PROPOFOL 500 MG/50ML IV EMUL
INTRAVENOUS | Status: DC | PRN
  Administered 2016-09-04: 100 ug/kg/min via INTRAVENOUS

## 2016-09-04 MED ORDER — LIDOCAINE 2% (20 MG/ML) 5 ML SYRINGE
INTRAMUSCULAR | Status: DC | PRN
  Administered 2016-09-04: 100 mg via INTRAVENOUS

## 2016-09-04 MED ORDER — LIDOCAINE 2% (20 MG/ML) 5 ML SYRINGE
INTRAMUSCULAR | Status: AC
  Filled 2016-09-04: qty 5

## 2016-09-04 MED ORDER — SODIUM CHLORIDE 0.9 % IV SOLN
INTRAVENOUS | Status: DC
  Administered 2016-09-04: 500 mL via INTRAVENOUS

## 2016-09-04 MED ORDER — PROPOFOL 10 MG/ML IV BOLUS
INTRAVENOUS | Status: DC | PRN
  Administered 2016-09-04 (×2): 50 mg via INTRAVENOUS

## 2016-09-04 MED ORDER — PROPOFOL 10 MG/ML IV BOLUS
INTRAVENOUS | Status: AC
  Filled 2016-09-04: qty 40

## 2016-09-04 SURGICAL SUPPLY — 21 items

## 2016-09-04 NOTE — Discharge Instructions (Signed)

## 2016-09-04 NOTE — Anesthesia Postprocedure Evaluation (Signed)
Anesthesia Post Note  Patient: Terry Juarez  Procedure(s) Performed: Procedure(s) (LRB): COLONOSCOPY WITH PROPOFOL (N/A) ESOPHAGOGASTRODUODENOSCOPY (EGD) WITH PROPOFOL (N/A)  Patient location during evaluation: PACU Anesthesia Type: MAC Level of consciousness: awake and alert Pain management: pain level controlled Vital Signs Assessment: post-procedure vital signs reviewed and stable Respiratory status: spontaneous breathing, nonlabored ventilation, respiratory function stable and patient connected to nasal cannula oxygen Cardiovascular status: stable and blood pressure returned to baseline Anesthetic complications: no       Last Vitals:  Vitals:   09/04/16 0708 09/04/16 0834  BP: (!) 174/95 (!) 166/97  Pulse: 80 88  Resp: 10 14  Temp: 36.9 C 36.5 C    Last Pain:  Vitals:   09/04/16 0834  TempSrc: Oral                 Rawlin Reaume S

## 2016-09-04 NOTE — Op Note (Signed)
[facility] Patient Name: Terry Juarez Procedure Date: 09/04/2016 MRN: [id number] Attending MD: Carol Ada , MD Date of Birth: [birth year] CSN: [id number] Age: 51 Admit Type: Outpatient Procedure:                Upper GI endoscopy Indications:              Follow-up of acute gastritis with hemorrhage Providers:                Carol Ada, MD, Laverta Baltimore RN, RN, Cherylynn Ridges, Technician, [facility], CRNA Referring MD:              Medicines:                Propofol per Anesthesia Complications:            No immediate complications. Estimated Blood Loss:     Estimated blood loss: none. Procedure:                Pre-Anesthesia Assessment:                           - Prior to the procedure, a History and Physical                            was performed, and patient medications and                            allergies were reviewed. The patient's tolerance of                            previous anesthesia was also reviewed. The risks                            and benefits of the procedure and the sedation                            options and risks were discussed with the patient.                            All questions were answered, and informed consent                            was obtained. Prior Anticoagulants: The patient has                            taken no previous anticoagulant or antiplatelet                            agents. ASA Grade Assessment: III - A patient with                            severe systemic disease. After reviewing the risks  and benefits, the patient was deemed in                            satisfactory condition to undergo the procedure.                           - Sedation was administered by an anesthesia                            professional. Deep sedation was attained.                           After obtaining informed consent, the endoscope was                             passed under direct vision. Throughout the                            procedure, the patient's blood pressure, pulse, and                            oxygen saturations were monitored continuously. The                            EC-[id number] ([id number]) scope was introduced through                            the mouth, and advanced to the second part of                            duodenum. The upper GI endoscopy was accomplished                            without difficulty. The patient tolerated the                            procedure well. Scope In: Scope Out: Findings:      The esophagus was normal.      The stomach was normal. There was persistence of the prior antral ulcer.       The area in the antrum was thickened, but no other abnormalities       identified.      The examined duodenum was normal. Impression:               - Normal esophagus.                           - Normal stomach.                           - Normal examined duodenum.                           - No specimens collected. Moderate Sedation:      N/A- Per Anesthesia Care Recommendation:           - Patient  has a contact number available for                            emergencies. The signs and symptoms of potential                            delayed complications were discussed with the                            patient. Return to normal activities tomorrow.                            Written discharge instructions were provided to the                            patient.                           - Resume previous diet.                           - Continue present medications.                           - No repeat upper endoscopy. Procedure Code(s):        --- Professional ---                           [phone], Esophagogastroduodenoscopy, flexible,                            transoral; diagnostic, including collection of                            specimen(s) by brushing or washing, when performed                             (separate procedure) Diagnosis Code(s):        --- Professional ---                           K29.01, Acute gastritis with bleeding CPT copyright 2016 [employer]. All rights reserved. The codes documented in this report are preliminary and upon coder review may  be revised to meet current compliance requirements. Carol Ada, MD Carol Ada, MD 09/04/2016 8:36:16 AM This report has been signed electronically. Number of Addenda: 0

## 2016-09-04 NOTE — Transfer of Care (Signed)
Immediate Anesthesia Transfer of Care Note  Patient: Terry Juarez  Procedure(s) Performed: Procedure(s): COLONOSCOPY WITH PROPOFOL (N/A) ESOPHAGOGASTRODUODENOSCOPY (EGD) WITH PROPOFOL (N/A)  Patient Location: PACU  Anesthesia Type:MAC  Level of Consciousness: awake, alert  and oriented  Airway & Oxygen Therapy: Patient Spontanous Breathing and Patient connected to nasal cannula oxygen  Post-op Assessment: Report given to RN and Post -op Vital signs reviewed and stable  Post vital signs: Reviewed and stable  Last Vitals:  Vitals:   09/04/16 0834 09/04/16 0840  BP: (!) 166/97 (!) 166/102  Pulse: 88 84  Resp: 14 11  Temp: 36.5 C     Last Pain:  Vitals:   09/04/16 0834  TempSrc: Oral         Complications: No apparent anesthesia complications

## 2016-09-04 NOTE — Op Note (Signed)
[facility] Patient Name: Terry Juarez Procedure Date: 09/04/2016 MRN: [id number] Attending MD: Carol Ada , MD Date of Birth: 1965-10-06 CSN: [id number] Age: 51 Admit Type: Outpatient Procedure:                Colonoscopy Indications:              Screening for colorectal malignant neoplasm Providers:                Carol Ada, MD, Laverta Baltimore RN, RN, Cherylynn Ridges, Technician, [facility], CRNA Referring MD:              Medicines:                Propofol per Anesthesia Complications:            No immediate complications. Estimated Blood Loss:     Estimated blood loss: none. Procedure:                Pre-Anesthesia Assessment:                           - Prior to the procedure, a History and Physical                            was performed, and patient medications and                            allergies were reviewed. The patient's tolerance of                            previous anesthesia was also reviewed. The risks                            and benefits of the procedure and the sedation                            options and risks were discussed with the patient.                            All questions were answered, and informed consent                            was obtained. Prior Anticoagulants: The patient has                            taken no previous anticoagulant or antiplatelet                            agents. ASA Grade Assessment: III - A patient with                            severe systemic disease. After reviewing the risks  and benefits, the patient was deemed in                            satisfactory condition to undergo the procedure.                           - Sedation was administered by an anesthesia                            professional. Deep sedation was attained.                           After obtaining informed consent, the colonoscope         was passed under direct vision. Throughout the                            procedure, the patient's blood pressure, pulse, and                            oxygen saturations were monitored continuously. The                            EC-3890LI ([id number]) scope was introduced through                            the anus and advanced to the the cecum, identified                            by appendiceal orifice and ileocecal valve. The                            EC-3490LI ([id number]) scope was introduced through                            the and advanced to the. The colonoscopy was                            performed without difficulty. The patient tolerated                            the procedure well. The quality of the bowel                            preparation was good. The ileocecal valve,                            appendiceal orifice, and rectum were photographed. Scope In: 8:11:48 AM Scope Out: 8:26:15 AM Scope Withdrawal Time: 0 hours 11 minutes 17 seconds  Total Procedure Duration: 0 hours 14 minutes 27 seconds  Findings:      A few small-mouthed diverticula were found in the sigmoid colon and       ascending colon. Impression:               - Diverticulosis in the sigmoid colon and in  the                            ascending colon.                           - No specimens collected. Moderate Sedation:      N/A- Per Anesthesia Care Recommendation:           - Patient has a contact number available for                            emergencies. The signs and symptoms of potential                            delayed complications were discussed with the                            patient. Return to normal activities tomorrow.                            Written discharge instructions were provided to the                            patient.                           - Resume previous diet.                           - Continue present medications.                           -  Repeat colonoscopy in 2 years for surveillance                            when she is s/p renal transplant. Procedure Code(s):        --- Professional ---                           U0454, Colorectal cancer screening; colonoscopy on                            individual not meeting criteria for high risk Diagnosis Code(s):        --- Professional ---                           Z12.11, Encounter for screening for malignant                            neoplasm of colon                           K57.30, Diverticulosis of large intestine without                            perforation or abscess without bleeding CPT copyright 2016 American  Medical Association. All rights reserved. The codes documented in this report are preliminary and upon coder review may  be revised to meet current compliance requirements. Carol Ada, MD Carol Ada, MD 09/04/2016 8:32:56 AM This report has been signed electronically. Number of Addenda: 0

## 2016-09-04 NOTE — Anesthesia Preprocedure Evaluation (Signed)
Anesthesia Evaluation  Patient identified by MRN, date of birth, ID band Patient awake    Reviewed: Allergy & Precautions, NPO status , Patient's Chart, lab work & pertinent test results  Airway Mallampati: II  TM Distance: >3 FB Neck ROM: Full    Dental no notable dental hx.    Pulmonary neg pulmonary ROS,    Pulmonary exam normal breath sounds clear to auscultation       Cardiovascular hypertension, Normal cardiovascular exam Rhythm:Regular Rate:Normal     Neuro/Psych negative neurological ROS  negative psych ROS   GI/Hepatic negative GI ROS, Neg liver ROS,   Endo/Other  negative endocrine ROS  Renal/GU DialysisRenal disease  negative genitourinary   Musculoskeletal negative musculoskeletal ROS (+)   Abdominal   Peds negative pediatric ROS (+)  Hematology  (+) anemia ,   Anesthesia Other Findings   Reproductive/Obstetrics negative OB ROS                             Anesthesia Physical Anesthesia Plan  ASA: III  Anesthesia Plan: MAC   Post-op Pain Management:    Induction: Intravenous  Airway Management Planned: Nasal Cannula  Additional Equipment:   Intra-op Plan:   Post-operative Plan:   Informed Consent: I have reviewed the patients History and Physical, chart, labs and discussed the procedure including the risks, benefits and alternatives for the proposed anesthesia with the patient or authorized representative who has indicated his/her understanding and acceptance.   Dental advisory given  Plan Discussed with: CRNA and Surgeon  Anesthesia Plan Comments:         Anesthesia Quick Evaluation

## 2016-09-04 NOTE — H&P (Addendum)
Terry Juarez HPI:  The patient is here for a screening colonoscopy secondary to her age and for pretransplant work up.  She was recently hospitalized for anemia secondary to an antral ulcer.  Past Medical History:  Diagnosis Date  . Anemia   . Bruises easily   . Dialysis patient ([place])   . ESRD on dialysis ([place])   . Hyperlipidemia   . Hypertension   . Renal disorder    rt renal mass / < functioning of left kidney - being prepared for possible dialysis  . Right renal mass     Past Surgical History:  Procedure Laterality Date  . AV FISTULA PLACEMENT Right 07/11/2014   Procedure: ARTERIOVENOUS (AV) FISTULA CREATION RIGHT ARM BRACHIO-CEPHALIC WITH ATTEMPTED RADIO-CEPHALIC (AV) FISTULA;  Surgeon: Mal Misty, MD;  Location: [place];  Service: Vascular;  Laterality: Right;  . [place]  . ESOPHAGOGASTRODUODENOSCOPY N/A 07/30/2016   Procedure: ESOPHAGOGASTRODUODENOSCOPY (EGD);  Surgeon: Carol Ada, MD;  Location: [facility];  Service: Endoscopy;  Laterality: N/A;  Bedside  . INSERTION OF DIALYSIS CATHETER Right 07/11/2014   Procedure: INSERTION OF DIALYSIS CATHETER IN RIGHT INTERNAL JUGULAR ;  Surgeon: Mal Misty, MD;  Location: [place];  Service: Vascular;  Laterality: Right;  . LAPAROSCOPIC NEPHRECTOMY Right 07/25/2014   Procedure: RIGHT LAPAROSCOPIC RADICAL NEPHRECTOMY ;  Surgeon: Ardis Hughs, MD;  Location: [facility];  Service: Urology;  Laterality: Right;  . PATCH ANGIOPLASTY Right 07/11/2014   Procedure: PATCH ANGIOPLASTY OF RIGHT RADIAL ARTERY USING CEPHALIC VEIN.;  Surgeon: Mal Misty, MD;  Location: [place];  Service: Vascular;  Laterality: Right;  . THROMBECTOMY W/ EMBOLECTOMY Right 07/11/2014   Procedure: THROMBECTOMY OF RIGHT RADIAL ARTERY  ;  Surgeon: Mal Misty, MD;  Location: [facility];  Service: Vascular;  Laterality: Right;    Family History  Problem Relation Age of Onset  . Throat cancer Mother     smoked  . Hypertension Father     Social  History:  reports that she has never smoked. She has never used smokeless tobacco. She reports that she drinks alcohol. She reports that she does not use drugs.  Allergies: No Known Allergies  Medications:  Scheduled:  Continuous: . sodium chloride 500 mL (09/04/16 0729)    No results found for this or any previous visit (from the past 24 hour(s)).   No results found.  ROS:  As stated above in the HPI otherwise negative.  Blood pressure (!) 174/95, pulse 80, temperature 98.5 F (36.9 C), temperature source Oral, resp. rate 10, height 5\' 5"  (1.651 m), weight 54.4 kg (120 lb), SpO2 100 %.    PE: Gen: NAD, Alert and Oriented HEENT:  [place]/AT, EOMI Neck: Supple, no LAD Lungs: CTA Bilaterally CV: RRR without M/G/R ABM: Soft, NTND, +BS Ext: No C/C/E  Assessment/Plan: 1) Screening colonoscopy. 2) Recent history of an antral ulcer.   Typically, with her findings of benign ulcers I do not repeat the EGD, however, with her transplant status and her admission as a result of anemia from the ulcers, I will repeat the EGD.  She received blood transfusions and this complicates her ability to undergo transplant as a result of antibody formation.  Plan: 1) EGD/Colonoscopy.  Terry Juarez D 09/04/2016, 7:50 AM

## 2016-09-07 ENCOUNTER — Encounter (HOSPITAL_COMMUNITY): Payer: Self-pay | Admitting: Gastroenterology

## 2016-10-03 DIAGNOSIS — N041 Nephrotic syndrome with focal and segmental glomerular lesions: Secondary | ICD-10-CM | POA: Diagnosis not present

## 2016-10-03 DIAGNOSIS — Z992 Dependence on renal dialysis: Secondary | ICD-10-CM | POA: Diagnosis not present

## 2016-10-03 DIAGNOSIS — N186 End stage renal disease: Secondary | ICD-10-CM | POA: Diagnosis not present

## 2016-10-29 DIAGNOSIS — E890 Postprocedural hypothyroidism: Secondary | ICD-10-CM | POA: Insufficient documentation

## 2016-11-02 DIAGNOSIS — Z992 Dependence on renal dialysis: Secondary | ICD-10-CM | POA: Diagnosis not present

## 2016-11-02 DIAGNOSIS — N041 Nephrotic syndrome with focal and segmental glomerular lesions: Secondary | ICD-10-CM | POA: Diagnosis not present

## 2016-11-02 DIAGNOSIS — N186 End stage renal disease: Secondary | ICD-10-CM | POA: Diagnosis not present

## 2016-11-12 DIAGNOSIS — N186 End stage renal disease: Secondary | ICD-10-CM | POA: Diagnosis not present

## 2016-11-12 DIAGNOSIS — E049 Nontoxic goiter, unspecified: Secondary | ICD-10-CM | POA: Diagnosis not present

## 2016-11-12 DIAGNOSIS — Z9889 Other specified postprocedural states: Secondary | ICD-10-CM | POA: Diagnosis not present

## 2016-11-12 DIAGNOSIS — Z4889 Encounter for other specified surgical aftercare: Secondary | ICD-10-CM | POA: Diagnosis not present

## 2016-11-12 DIAGNOSIS — Z79899 Other long term (current) drug therapy: Secondary | ICD-10-CM | POA: Diagnosis not present

## 2016-12-07 NOTE — Addendum Note (Signed)
Addendum  created 12/07/16 1154 by Myrtie Soman, MD   Sign clinical note

## 2016-12-07 NOTE — Anesthesia Postprocedure Evaluation (Signed)
Anesthesia Post Note  Patient: Terry Juarez  Procedure(s) Performed: Procedure(s) (LRB): COLONOSCOPY WITH PROPOFOL (N/A) ESOPHAGOGASTRODUODENOSCOPY (EGD) WITH PROPOFOL (N/A)     Anesthesia Post Evaluation  Last Vitals:  Vitals:   09/04/16 0850 09/04/16 0855  BP: (!) 178/106 (!) 174/99  Pulse: 86 82  Resp: 16 15  Temp:      Last Pain:  Vitals:   09/07/16 1217  TempSrc:   PainSc: 0-No pain                 Jisselle Poth S

## 2016-12-19 ENCOUNTER — Inpatient Hospital Stay (HOSPITAL_COMMUNITY)
Admission: EM | Admit: 2016-12-19 | Discharge: 2017-01-04 | DRG: 064 | Disposition: A | Discharge status: Rehabilitation Facility | Payer: BLUE CROSS/BLUE SHIELD | Attending: Internal Medicine | Admitting: Internal Medicine

## 2016-12-19 ENCOUNTER — Emergency Department (HOSPITAL_COMMUNITY): Payer: BLUE CROSS/BLUE SHIELD

## 2016-12-19 ENCOUNTER — Encounter (HOSPITAL_COMMUNITY): Payer: Self-pay

## 2016-12-19 DIAGNOSIS — M21372 Foot drop, left foot: Secondary | ICD-10-CM | POA: Diagnosis present

## 2016-12-19 DIAGNOSIS — Z8249 Family history of ischemic heart disease and other diseases of the circulatory system: Secondary | ICD-10-CM | POA: Diagnosis not present

## 2016-12-19 DIAGNOSIS — I609 Nontraumatic subarachnoid hemorrhage, unspecified: Secondary | ICD-10-CM

## 2016-12-19 DIAGNOSIS — R748 Abnormal levels of other serum enzymes: Secondary | ICD-10-CM | POA: Diagnosis not present

## 2016-12-19 DIAGNOSIS — M7989 Other specified soft tissue disorders: Secondary | ICD-10-CM | POA: Diagnosis not present

## 2016-12-19 DIAGNOSIS — Z79899 Other long term (current) drug therapy: Secondary | ICD-10-CM

## 2016-12-19 DIAGNOSIS — Z85528 Personal history of other malignant neoplasm of kidney: Secondary | ICD-10-CM | POA: Diagnosis not present

## 2016-12-19 DIAGNOSIS — G9340 Encephalopathy, unspecified: Secondary | ICD-10-CM | POA: Diagnosis present

## 2016-12-19 DIAGNOSIS — R739 Hyperglycemia, unspecified: Secondary | ICD-10-CM | POA: Diagnosis not present

## 2016-12-19 DIAGNOSIS — I161 Hypertensive emergency: Secondary | ICD-10-CM | POA: Diagnosis present

## 2016-12-19 DIAGNOSIS — Z298 Encounter for other specified prophylactic measures: Secondary | ICD-10-CM | POA: Diagnosis not present

## 2016-12-19 DIAGNOSIS — G8194 Hemiplegia, unspecified affecting left nondominant side: Secondary | ICD-10-CM | POA: Diagnosis present

## 2016-12-19 DIAGNOSIS — J9601 Acute respiratory failure with hypoxia: Secondary | ICD-10-CM | POA: Diagnosis not present

## 2016-12-19 DIAGNOSIS — R001 Bradycardia, unspecified: Secondary | ICD-10-CM | POA: Diagnosis not present

## 2016-12-19 DIAGNOSIS — W19XXXA Unspecified fall, initial encounter: Secondary | ICD-10-CM | POA: Diagnosis not present

## 2016-12-19 DIAGNOSIS — D72828 Other elevated white blood cell count: Secondary | ICD-10-CM | POA: Diagnosis not present

## 2016-12-19 DIAGNOSIS — K5901 Slow transit constipation: Secondary | ICD-10-CM | POA: Diagnosis not present

## 2016-12-19 DIAGNOSIS — R569 Unspecified convulsions: Secondary | ICD-10-CM | POA: Diagnosis present

## 2016-12-19 DIAGNOSIS — R29810 Facial weakness: Secondary | ICD-10-CM | POA: Diagnosis present

## 2016-12-19 DIAGNOSIS — I10 Essential (primary) hypertension: Secondary | ICD-10-CM

## 2016-12-19 DIAGNOSIS — R269 Unspecified abnormalities of gait and mobility: Secondary | ICD-10-CM | POA: Diagnosis not present

## 2016-12-19 DIAGNOSIS — N2581 Secondary hyperparathyroidism of renal origin: Secondary | ICD-10-CM | POA: Diagnosis present

## 2016-12-19 DIAGNOSIS — Z9911 Dependence on respirator [ventilator] status: Secondary | ICD-10-CM

## 2016-12-19 DIAGNOSIS — S066X0A Traumatic subarachnoid hemorrhage without loss of consciousness, initial encounter: Secondary | ICD-10-CM

## 2016-12-19 DIAGNOSIS — E875 Hyperkalemia: Secondary | ICD-10-CM | POA: Diagnosis present

## 2016-12-19 DIAGNOSIS — I611 Nontraumatic intracerebral hemorrhage in hemisphere, cortical: Secondary | ICD-10-CM | POA: Diagnosis present

## 2016-12-19 DIAGNOSIS — I629 Nontraumatic intracranial hemorrhage, unspecified: Secondary | ICD-10-CM | POA: Diagnosis not present

## 2016-12-19 DIAGNOSIS — Z905 Acquired absence of kidney: Secondary | ICD-10-CM | POA: Diagnosis not present

## 2016-12-19 DIAGNOSIS — E785 Hyperlipidemia, unspecified: Secondary | ICD-10-CM | POA: Diagnosis present

## 2016-12-19 DIAGNOSIS — G936 Cerebral edema: Secondary | ICD-10-CM | POA: Diagnosis present

## 2016-12-19 DIAGNOSIS — E038 Other specified hypothyroidism: Secondary | ICD-10-CM | POA: Diagnosis not present

## 2016-12-19 DIAGNOSIS — Z992 Dependence on renal dialysis: Secondary | ICD-10-CM | POA: Diagnosis not present

## 2016-12-19 DIAGNOSIS — R9401 Abnormal electroencephalogram [EEG]: Secondary | ICD-10-CM | POA: Diagnosis present

## 2016-12-19 DIAGNOSIS — Z808 Family history of malignant neoplasm of other organs or systems: Secondary | ICD-10-CM

## 2016-12-19 DIAGNOSIS — K5900 Constipation, unspecified: Secondary | ICD-10-CM | POA: Diagnosis not present

## 2016-12-19 DIAGNOSIS — I69154 Hemiplegia and hemiparesis following nontraumatic intracerebral hemorrhage affecting left non-dominant side: Secondary | ICD-10-CM | POA: Diagnosis not present

## 2016-12-19 DIAGNOSIS — I6783 Posterior reversible encephalopathy syndrome: Secondary | ICD-10-CM | POA: Diagnosis not present

## 2016-12-19 DIAGNOSIS — Z681 Body mass index (BMI) 19 or less, adult: Secondary | ICD-10-CM | POA: Diagnosis not present

## 2016-12-19 DIAGNOSIS — S066XAA Traumatic subarachnoid hemorrhage with loss of consciousness status unknown, initial encounter: Secondary | ICD-10-CM

## 2016-12-19 DIAGNOSIS — Z888 Allergy status to other drugs, medicaments and biological substances status: Secondary | ICD-10-CM | POA: Diagnosis not present

## 2016-12-19 DIAGNOSIS — N186 End stage renal disease: Secondary | ICD-10-CM | POA: Diagnosis not present

## 2016-12-19 DIAGNOSIS — I674 Hypertensive encephalopathy: Secondary | ICD-10-CM | POA: Diagnosis not present

## 2016-12-19 DIAGNOSIS — G40909 Epilepsy, unspecified, not intractable, without status epilepticus: Secondary | ICD-10-CM

## 2016-12-19 DIAGNOSIS — R29713 NIHSS score 13: Secondary | ICD-10-CM | POA: Diagnosis present

## 2016-12-19 DIAGNOSIS — D751 Secondary polycythemia: Secondary | ICD-10-CM | POA: Diagnosis present

## 2016-12-19 DIAGNOSIS — Z781 Physical restraint status: Secondary | ICD-10-CM

## 2016-12-19 DIAGNOSIS — E039 Hypothyroidism, unspecified: Secondary | ICD-10-CM | POA: Diagnosis present

## 2016-12-19 DIAGNOSIS — R2689 Other abnormalities of gait and mobility: Secondary | ICD-10-CM | POA: Diagnosis not present

## 2016-12-19 DIAGNOSIS — I120 Hypertensive chronic kidney disease with stage 5 chronic kidney disease or end stage renal disease: Secondary | ICD-10-CM | POA: Diagnosis present

## 2016-12-19 DIAGNOSIS — Z9289 Personal history of other medical treatment: Secondary | ICD-10-CM

## 2016-12-19 DIAGNOSIS — E872 Acidosis: Secondary | ICD-10-CM | POA: Diagnosis present

## 2016-12-19 DIAGNOSIS — S066X9A Traumatic subarachnoid hemorrhage with loss of consciousness of unspecified duration, initial encounter: Secondary | ICD-10-CM

## 2016-12-19 DIAGNOSIS — I469 Cardiac arrest, cause unspecified: Secondary | ICD-10-CM | POA: Diagnosis not present

## 2016-12-19 DIAGNOSIS — D649 Anemia, unspecified: Secondary | ICD-10-CM | POA: Diagnosis present

## 2016-12-19 DIAGNOSIS — I69398 Other sequelae of cerebral infarction: Secondary | ICD-10-CM | POA: Diagnosis not present

## 2016-12-19 DIAGNOSIS — R404 Transient alteration of awareness: Secondary | ICD-10-CM

## 2016-12-19 DIAGNOSIS — I610 Nontraumatic intracerebral hemorrhage in hemisphere, subcortical: Secondary | ICD-10-CM | POA: Diagnosis not present

## 2016-12-19 DIAGNOSIS — R4182 Altered mental status, unspecified: Secondary | ICD-10-CM

## 2016-12-19 DIAGNOSIS — D631 Anemia in chronic kidney disease: Secondary | ICD-10-CM | POA: Diagnosis present

## 2016-12-19 DIAGNOSIS — I612 Nontraumatic intracerebral hemorrhage in hemisphere, unspecified: Secondary | ICD-10-CM | POA: Diagnosis not present

## 2016-12-19 DIAGNOSIS — I619 Nontraumatic intracerebral hemorrhage, unspecified: Secondary | ICD-10-CM | POA: Diagnosis not present

## 2016-12-19 DIAGNOSIS — R54 Age-related physical debility: Secondary | ICD-10-CM | POA: Diagnosis present

## 2016-12-19 DIAGNOSIS — M25512 Pain in left shoulder: Secondary | ICD-10-CM | POA: Diagnosis not present

## 2016-12-19 DIAGNOSIS — I959 Hypotension, unspecified: Secondary | ICD-10-CM | POA: Diagnosis not present

## 2016-12-19 DIAGNOSIS — D72829 Elevated white blood cell count, unspecified: Secondary | ICD-10-CM | POA: Diagnosis not present

## 2016-12-19 DIAGNOSIS — C641 Malignant neoplasm of right kidney, except renal pelvis: Secondary | ICD-10-CM | POA: Diagnosis not present

## 2016-12-19 DIAGNOSIS — F329 Major depressive disorder, single episode, unspecified: Secondary | ICD-10-CM | POA: Diagnosis present

## 2016-12-19 LAB — COMPREHENSIVE METABOLIC PANEL
ALBUMIN: 3.6 g/dL (ref 3.5–5.0)
ALK PHOS: 43 U/L (ref 38–126)
ALT: 21 U/L (ref 14–54)
ANION GAP: 25 — AB (ref 5–15)
AST: 82 U/L — ABNORMAL HIGH (ref 15–41)
BUN: 95 mg/dL — ABNORMAL HIGH (ref 6–20)
CO2: 13 mmol/L — AB (ref 22–32)
Calcium: 8.5 mg/dL — ABNORMAL LOW (ref 8.9–10.3)
Chloride: 103 mmol/L (ref 101–111)
Creatinine, Ser: 13.97 mg/dL — ABNORMAL HIGH (ref 0.44–1.00)
GFR calc non Af Amer: 3 mL/min — ABNORMAL LOW (ref >60)
GFR, EST AFRICAN AMERICAN: 3 mL/min — AB (ref >60)
GLUCOSE: 101 mg/dL — AB (ref 65–99)
Potassium: 6 mmol/L — ABNORMAL HIGH (ref 3.5–5.1)
SODIUM: 141 mmol/L (ref 135–145)
Total Bilirubin: 0.9 mg/dL (ref 0.3–1.2)
Total Protein: 6.4 g/dL — ABNORMAL LOW (ref 6.5–8.1)

## 2016-12-19 LAB — I-STAT CG4 LACTIC ACID, ED
Lactic Acid, Venous: 5.86 mmol/L (ref 0.5–1.9)
Lactic Acid, Venous: 1.54 mmol/L (ref 0.5–1.9)

## 2016-12-19 LAB — CBC WITH DIFFERENTIAL/PLATELET
BASOS ABS: 0 10*3/uL (ref 0.0–0.1)
Basophils Relative: 0 %
EOS PCT: 3 %
Eosinophils Absolute: 0.2 10*3/uL (ref 0.0–0.7)
HEMATOCRIT: 58.6 % — AB (ref 36.0–46.0)
Hemoglobin: 18.7 g/dL — ABNORMAL HIGH (ref 12.0–15.0)
LYMPHS ABS: 1.5 10*3/uL (ref 0.7–4.0)
LYMPHS PCT: 17 %
MCH: 33.3 pg (ref 26.0–34.0)
MCHC: 31.9 g/dL (ref 30.0–36.0)
MCV: 104.3 fL — AB (ref 78.0–100.0)
MONO ABS: 0.7 10*3/uL (ref 0.1–1.0)
Monocytes Relative: 7 %
NEUTROS ABS: 6.6 10*3/uL (ref 1.7–7.7)
Neutrophils Relative %: 73 %
PLATELETS: 239 10*3/uL (ref 150–400)
RBC: 5.62 MIL/uL — AB (ref 3.87–5.11)
RDW: 15.2 % (ref 11.5–15.5)
WBC: 9.1 10*3/uL (ref 4.0–10.5)

## 2016-12-19 LAB — BASIC METABOLIC PANEL
Anion gap: 17 — ABNORMAL HIGH (ref 5–15)
BUN: 90 mg/dL — ABNORMAL HIGH (ref 6–20)
CO2: 18 mmol/L — ABNORMAL LOW (ref 22–32)
CREATININE: 13.31 mg/dL — AB (ref 0.44–1.00)
Calcium: 8.5 mg/dL — ABNORMAL LOW (ref 8.9–10.3)
Chloride: 103 mmol/L (ref 101–111)
GFR calc Af Amer: 3 mL/min — ABNORMAL LOW (ref >60)
GFR, EST NON AFRICAN AMERICAN: 3 mL/min — AB (ref >60)
GLUCOSE: 98 mg/dL (ref 65–99)
Potassium: 4.3 mmol/L (ref 3.5–5.1)
SODIUM: 138 mmol/L (ref 135–145)

## 2016-12-19 LAB — I-STAT ARTERIAL BLOOD GAS, ED
ACID-BASE DEFICIT: 8 mmol/L — AB (ref 0.0–2.0)
Bicarbonate: 18.6 mmol/L — ABNORMAL LOW (ref 20.0–28.0)
O2 Saturation: 99 %
PO2 ART: 133 mmHg — AB (ref 83.0–108.0)
TCO2: 20 mmol/L (ref 0–100)
pCO2 arterial: 41.4 mmHg (ref 32.0–48.0)
pH, Arterial: 7.261 — ABNORMAL LOW (ref 7.350–7.450)

## 2016-12-19 LAB — CBG MONITORING, ED: Glucose-Capillary: 119 mg/dL — ABNORMAL HIGH (ref 65–99)

## 2016-12-19 LAB — RAPID URINE DRUG SCREEN, HOSP PERFORMED
AMPHETAMINES: NOT DETECTED
BARBITURATES: NOT DETECTED
BENZODIAZEPINES: POSITIVE — AB
Cocaine: NOT DETECTED
Opiates: NOT DETECTED
Tetrahydrocannabinol: NOT DETECTED

## 2016-12-19 LAB — I-STAT TROPONIN, ED: TROPONIN I, POC: 0.22 ng/mL — AB (ref 0.00–0.08)

## 2016-12-19 LAB — I-STAT CHEM 8, ED
BUN: 134 mg/dL — ABNORMAL HIGH (ref 6–20)
Calcium, Ion: 0.83 mmol/L — CL (ref 1.15–1.40)
Chloride: 112 mmol/L — ABNORMAL HIGH (ref 101–111)
Creatinine, Ser: 12.9 mg/dL — ABNORMAL HIGH (ref 0.44–1.00)
Glucose, Bld: 106 mg/dL — ABNORMAL HIGH (ref 65–99)
HCT: 60 % — ABNORMAL HIGH (ref 36.0–46.0)
HEMOGLOBIN: 20.4 g/dL — AB (ref 12.0–15.0)
POTASSIUM: 8.2 mmol/L — AB (ref 3.5–5.1)
SODIUM: 135 mmol/L (ref 135–145)
TCO2: 18 mmol/L (ref 0–100)

## 2016-12-19 LAB — TROPONIN I: TROPONIN I: 1.21 ng/mL — AB (ref <0.03)

## 2016-12-19 LAB — PROTIME-INR
INR: 0.97
PROTHROMBIN TIME: 12.9 s (ref 11.4–15.2)

## 2016-12-19 LAB — I-STAT BETA HCG BLOOD, ED (MC, WL, AP ONLY): I-stat hCG, quantitative: 6.1 m[IU]/mL — ABNORMAL HIGH (ref <5)

## 2016-12-19 MED ORDER — DEXTROSE 50 % IV SOLN
1.0000 | Freq: Once | INTRAVENOUS | Status: AC
  Administered 2016-12-19: 50 mL via INTRAVENOUS
  Filled 2016-12-19: qty 50

## 2016-12-19 MED ORDER — IOPAMIDOL (ISOVUE-370) INJECTION 76%
INTRAVENOUS | Status: AC
  Administered 2016-12-19: 50 mL via INTRAVENOUS
  Filled 2016-12-19: qty 50

## 2016-12-19 MED ORDER — NICARDIPINE HCL IN NACL 20-0.86 MG/200ML-% IV SOLN
3.0000 mg/h | Freq: Once | INTRAVENOUS | Status: AC
  Administered 2016-12-19: 7.5 mg/h via INTRAVENOUS
  Filled 2016-12-19: qty 200

## 2016-12-19 MED ORDER — HEPARIN 1000 UNIT/ML FOR PERITONEAL DIALYSIS: 500.0000 [IU] | INTRAMUSCULAR | Status: DC | PRN

## 2016-12-19 MED ORDER — GENTAMICIN SULFATE 0.1 % EX CREA
1.0000 "application " | TOPICAL_CREAM | Freq: Every day | CUTANEOUS | Status: DC
  Administered 2016-12-20 – 2016-12-24 (×4): 1 via TOPICAL
  Filled 2016-12-19: qty 15

## 2016-12-19 MED ORDER — CALCIUM GLUCONATE 10 % IV SOLN
1.0000 g | Freq: Once | INTRAVENOUS | Status: AC
  Administered 2016-12-19: 1 g via INTRAVENOUS
  Filled 2016-12-19: qty 10

## 2016-12-19 MED ORDER — NICARDIPINE HCL IN NACL 20-0.86 MG/200ML-% IV SOLN
INTRAVENOUS | Status: AC
  Filled 2016-12-19: qty 200

## 2016-12-19 MED ORDER — DELFLEX-LC/2.5% DEXTROSE 394 MOSM/L IP SOLN: INTRAPERITONEAL | Status: DC

## 2016-12-19 MED ORDER — INSULIN ASPART 100 UNIT/ML IV SOLN
10.0000 [IU] | Freq: Once | INTRAVENOUS | Status: AC
  Administered 2016-12-19: 10 [IU] via INTRAVENOUS
  Filled 2016-12-19: qty 0.1

## 2016-12-19 MED ORDER — NICARDIPINE HCL IN NACL 20-0.86 MG/200ML-% IV SOLN
3.0000 mg/h | Freq: Once | INTRAVENOUS | Status: AC
  Administered 2016-12-19: 10 mg/h via INTRAVENOUS
  Administered 2016-12-19: 5 mg/h via INTRAVENOUS
  Filled 2016-12-19: qty 200

## 2016-12-19 MED ORDER — HYDROMORPHONE HCL 1 MG/ML IJ SOLN
0.5000 mg | INTRAMUSCULAR | Status: DC | PRN
  Administered 2016-12-19 – 2016-12-24 (×6): 0.5 mg via INTRAVENOUS
  Filled 2016-12-19: qty 0.5
  Filled 2016-12-19: qty 1
  Filled 2016-12-19: qty 0.5
  Filled 2016-12-19: qty 1
  Filled 2016-12-19 (×2): qty 0.5

## 2016-12-19 MED ORDER — FAMOTIDINE IN NACL 20-0.9 MG/50ML-% IV SOLN
20.0000 mg | Freq: Two times a day (BID) | INTRAVENOUS | Status: DC
  Administered 2016-12-20 (×2): 20 mg via INTRAVENOUS
  Filled 2016-12-19 (×2): qty 50

## 2016-12-19 MED ORDER — LEVETIRACETAM 500 MG/5ML IV SOLN
500.0000 mg | Freq: Two times a day (BID) | INTRAVENOUS | Status: DC
  Administered 2016-12-20 – 2016-12-21 (×3): 500 mg via INTRAVENOUS
  Filled 2016-12-19 (×3): qty 5

## 2016-12-19 MED ORDER — SODIUM CHLORIDE 0.9 % IV SOLN
1000.0000 mg | Freq: Once | INTRAVENOUS | Status: AC
  Administered 2016-12-19: 1000 mg via INTRAVENOUS
  Filled 2016-12-19: qty 10

## 2016-12-19 MED ORDER — SODIUM CHLORIDE 0.9 % IV SOLN
250.0000 mL | INTRAVENOUS | Status: DC | PRN
  Administered 2016-12-25: 250 mL via INTRAVENOUS

## 2016-12-19 NOTE — Consult Note (Addendum)
NEURO HOSPITALIST CONSULT NOTE   Requestig physician: Dr. Ashok Cordia  Reason for Consult: Right frontal [place]  History obtained from:  Nursing communication of report initially obtained from EMS and tenant of patient, with some contribution from family  HPI:                                                                                                                                          Terry Juarez is an 51 y.o. female periotoneal dialysis patient with HTN, HLD and anemia who presented with new onset seizure, acute left sided weakness and right frontal headache. At baseline she is cognitively intact and handles her own peritoneal dialysis.  A person who rents lodging from the patient found her on the floor today seizing and with left sided weakness. LKN is 11 AM today. EMS administered 5 mg Versed, which stopped the seizure. On arrival to the ED she was loaded with Keppra 1000 mg IV. No further seizures have occurred.    A STAT CT head was obtained, revealing a large right frontal lobe hemorrhage. CT head detailed findings: 1. Right posterior frontal lobe acute hemorrhage measuring up to 29 mm with small area of surrounding edema and local mass effect. No significant midline shift or herniation. 2. Small volume of subarachnoid hemorrhage overlying the right frontal convexity. 3. Hypoattenuation within subcortical white matter and bilateral parietal and occipital lobes. Findings are suggestive of PRES in the setting of hypertension.   Ca gluconate, dextrose and insulin given for hyperkalemia. Also started on nicardipine drip for HTN.    Past Medical History:  Diagnosis Date  . Anemia   . Bruises easily   . Dialysis patient ([place])   . ESRD on dialysis ([place])   . Hyperlipidemia   . Hypertension   . Renal disorder    rt renal mass / < functioning of left kidney - being prepared for possible dialysis  . Right renal mass     Past Surgical History:  Procedure  Laterality Date  . AV FISTULA PLACEMENT Right 07/11/2014   Procedure: ARTERIOVENOUS (AV) FISTULA CREATION RIGHT ARM BRACHIO-CEPHALIC WITH ATTEMPTED RADIO-CEPHALIC (AV) FISTULA;  Surgeon: Mal Misty, MD;  Location: [facility];  Service: Vascular;  Laterality: Right;  . COLONOSCOPY WITH PROPOFOL N/A 09/04/2016   Procedure: COLONOSCOPY WITH PROPOFOL;  Surgeon: Carol Ada, MD;  Location: [facility];  Service: Endoscopy;  Laterality: N/A;  . ECTOPIC PREGNANCY SURGERY  1987  . ESOPHAGOGASTRODUODENOSCOPY N/A 07/30/2016   Procedure: ESOPHAGOGASTRODUODENOSCOPY (EGD);  Surgeon: Carol Ada, MD;  Location: [facility] ENDOSCOPY;  Service: Endoscopy;  Laterality: N/A;  Bedside  . ESOPHAGOGASTRODUODENOSCOPY (EGD) WITH PROPOFOL N/A 09/04/2016   Procedure: ESOPHAGOGASTRODUODENOSCOPY (EGD) WITH PROPOFOL;  Surgeon: Carol Ada, MD;  Location: [facility];  Service: Endoscopy;  Laterality: N/A;  .  INSERTION OF DIALYSIS CATHETER Right 07/11/2014   Procedure: INSERTION OF DIALYSIS CATHETER IN RIGHT INTERNAL JUGULAR ;  Surgeon: Mal Misty, MD;  Location: [facility];  Service: Vascular;  Laterality: Right;  . LAPAROSCOPIC NEPHRECTOMY Right 07/25/2014   Procedure: RIGHT LAPAROSCOPIC RADICAL NEPHRECTOMY ;  Surgeon: Ardis Hughs, MD;  Location: [facility];  Service: Urology;  Laterality: Right;  . PATCH ANGIOPLASTY Right 07/11/2014   Procedure: PATCH ANGIOPLASTY OF RIGHT RADIAL ARTERY USING CEPHALIC VEIN.;  Surgeon: Mal Misty, MD;  Location: [facility];  Service: Vascular;  Laterality: Right;  . THROMBECTOMY W/ EMBOLECTOMY Right 07/11/2014   Procedure: THROMBECTOMY OF RIGHT RADIAL ARTERY  ;  Surgeon: Mal Misty, MD;  Location[facility];  Service: Vascular;  Laterality: Right;    Family History  Problem Relation Age of Onset  . Throat cancer Mother        smoked  . Hypertension Father    Social History:  reports that she has never smoked. She has never used smokeless tobacco. She reports that she drinks alcohol. She reports that  she does not use drugs.  No Known Allergies  HOME MEDICATIONS:                                                                                                                     acetaminophen (TYLENOL) 500 MG tablet Take 1,000 mg by mouth every 6 (six) hours as needed for moderate pain or headache. [provider] Needs Review  amLODipine (NORVASC) 10 MG tablet Take 10 mg by mouth daily.  [provider] Needs Review  calcitRIOL (ROCALTROL) 0.5 MCG capsule Take 0.5 mcg by mouth every Monday, Wednesday, and Friday. [provider] Needs Review  calcium acetate (PHOSLO) 667 MG capsule Take 667-2,001 mg by mouth 3 (three) times daily with meals. Take 2001 mg three times a day with meals  Take 667 mg with snacks twice daily [provider] Needs Review  losartan (COZAAR) 100 MG tablet Take 100 mg by mouth at bedtime.  [provider] Needs Review  multivitamin (RENA-VIT) TABS tablet Take 1 tablet by mouth daily. [provider] Needs Review  pantoprazole (PROTONIX) 40 MG tablet Take 1 tablet (40 mg total) by mouth 2 (two) times daily. Bonnielee Haff, MD Needs Review  SENSIPAR 60 MG tablet Take 60 mg by mouth at bedtime.  [provider] Needs Review  sodium bicarbonate 650 MG tablet Take 650 mg by mouth 2 (two) times daily. [provider] Needs Review     ROS:  Positive for right frontal headache and left sided weakness with sensory loss.   Blood pressure 130/82, pulse 88, resp. rate 13, SpO2 96 %.  General Examination:                                                                                                      HEENT-  [place]/AT    Lungs- On O2 by face mask Extremities- Warm and well-perfused. No edema.   Neurological Examination Mental Status: Drowsy with slowed mentation and  increased latency of motor and verbal responses. Able to follow simple commands and answer simple questions without evidence for receptive or expressive aphasia. GCS = 12 Cranial Nerves: II: Gazes towards visual stimuli in left and right visual fields III,IV, VI: Slow EOM with low amplitude, predominantly near the midline. No nystagmus. Has difficulty with visual tracking V,VII: smile flaccidly symmetric; testing of grimace deferred. Grossly intact FT sensation bilaterally   VIII: hearing intact to voice IX,X: Phonation intact XI: Left sided weakness XII: O2 by face mask; deferred testing of tongue protrusion Motor: LUE and LLE flaccid with 0/5 strength RUE and RLE: 4+/5 proximal and distal Sensory: States she can feel FT stimuli to left and right, upper and lower extremities Deep Tendon Reflexes: 3+ biceps and brachioradialis bilaterally. 4+ patellae, achilles bilaterally Cerebellar: Unable to perform due to drowsiness Gait: Deferred  Lab Results: Basic Metabolic Panel:  Recent Labs Lab 12/19/16 1648 12/19/16 1657  NA 141 135  K 6.0* 8.2*  CL 103 112*  CO2 13*  --   GLUCOSE 101* 106*  BUN 95* 134*  CREATININE 13.97* 12.90*  CALCIUM 8.5*  --     Liver Function Tests:  Recent Labs Lab 12/19/16 1648  AST 82*  ALT 21  ALKPHOS 43  BILITOT 0.9  PROT 6.4*  ALBUMIN 3.6   No results for input(s): LIPASE, AMYLASE in the last 168 hours. No results for input(s): AMMONIA in the last 168 hours.  CBC:  Recent Labs Lab 12/19/16 1648 12/19/16 1657  WBC 9.1  --   NEUTROABS 6.6  --   HGB 18.7* 20.4*  HCT 58.6* 60.0*  MCV 104.3*  --   PLT 239  --     Cardiac Enzymes: No results for input(s): CKTOTAL, CKMB, CKMBINDEX, TROPONINI in the last 168 hours.  Lipid Panel: No results for input(s): CHOL, TRIG, HDL, CHOLHDL, VLDL, LDLCALC in the last 168 hours.  CBG:  Recent Labs Lab 12/19/16 1644  GLUCAP 119*    Microbiology: Results for orders placed or performed  during the hospital encounter of 07/29/16  Gastrointestinal Panel by PCR , Stool     Status: None   Collection Time: 07/29/16 11:32 PM  Result Value Ref Range Status   Campylobacter species NOT DETECTED NOT DETECTED Final   Plesimonas shigelloides NOT DETECTED NOT DETECTED Final   Salmonella species NOT DETECTED NOT DETECTED Final   Yersinia enterocolitica NOT DETECTED NOT DETECTED Final   Vibrio species NOT DETECTED NOT DETECTED Final   Vibrio cholerae NOT DETECTED NOT DETECTED Final   Enteroaggregative E coli (EAEC) NOT  DETECTED NOT DETECTED Final   Enteropathogenic E coli (EPEC) NOT DETECTED NOT DETECTED Final   Enterotoxigenic E coli (ETEC) NOT DETECTED NOT DETECTED Final   Shiga like toxin producing E coli (STEC) NOT DETECTED NOT DETECTED Final   Shigella/Enteroinvasive E coli (EIEC) NOT DETECTED NOT DETECTED Final   Cryptosporidium NOT DETECTED NOT DETECTED Final   Cyclospora cayetanensis NOT DETECTED NOT DETECTED Final   Entamoeba histolytica NOT DETECTED NOT DETECTED Final   Giardia lamblia NOT DETECTED NOT DETECTED Final   Adenovirus F40/41 NOT DETECTED NOT DETECTED Final   Astrovirus NOT DETECTED NOT DETECTED Final   Norovirus GI/GII NOT DETECTED NOT DETECTED Final   Rotavirus A NOT DETECTED NOT DETECTED Final   Sapovirus (I, II, IV, and V) NOT DETECTED NOT DETECTED Final  MRSA PCR Screening     Status: None   Collection Time: 07/30/16 12:37 AM  Result Value Ref Range Status   MRSA by PCR NEGATIVE NEGATIVE Final    Comment:        The GeneXpert MRSA Assay (FDA approved for NASAL specimens only), is one component of a comprehensive MRSA colonization surveillance program. It is not intended to diagnose MRSA infection nor to guide or monitor treatment for MRSA infections.   Culture, blood (Routine X 2) w Reflex to ID Panel     Status: None   Collection Time: 07/30/16  5:30 AM  Result Value Ref Range Status   Specimen Description BLOOD LEFT HAND  Final   Special  Requests BOTTLES DRAWN AEROBIC ONLY 5CC  Final   Culture NO GROWTH 5 DAYS  Final   Report Status 08/04/2016 FINAL  Final  Culture, blood (Routine X 2) w Reflex to ID Panel     Status: None   Collection Time: 07/30/16  5:37 AM  Result Value Ref Range Status   Specimen Description BLOOD LEFT HAND  Final   Special Requests BOTTLES DRAWN AEROBIC ONLY 5CC  Final   Culture NO GROWTH 5 DAYS  Final   Report Status 08/04/2016 FINAL  Final    Coagulation Studies:  Recent Labs  12/19/16 1818  LABPROT 12.9  INR 0.97    Imaging: Ct Head Wo Contrast  Result Date: 12/19/2016 CLINICAL DATA:  51 y/o  F; seizure. EXAM: CT HEAD WITHOUT CONTRAST CT CERVICAL SPINE WITHOUT CONTRAST TECHNIQUE: Multidetector CT imaging of the head and cervical spine was performed following the standard protocol without intravenous contrast. Multiplanar CT image reconstructions of the cervical spine were also generated. COMPARISON:  02/11/2015 CT of the head. FINDINGS: CT HEAD FINDINGS Brain: Right posterior frontal lobe acute hemorrhage measuring 22 x 29 x 21 mm (AP x ML x CC series 3 image 26 and series 5, image 34). Small volume of subarachnoid hemorrhage with overlying the right frontal convexity and along the falx. There is a small surrounding region of hypoattenuation in the brain compatible with edema with local mass effect effacing sulci. No significant midline shift. No evidence for herniation. No large territory acute stroke identified. There are foci of hypoattenuation within subcortical white matter in the bilateral parietal lobes and occipital lobes. The distribution is suggestive of PRES. Vascular: Extensive calcific atherosclerosis of the carotid siphons. Skull: Normal. Negative for fracture or focal lesion. Sinuses/Orbits: No acute finding. Other: None. CT CERVICAL SPINE FINDINGS Alignment: Normal. Skull base and vertebrae: No acute fracture. No primary bone lesion or focal pathologic process. Soft tissues and spinal  canal: No prevertebral fluid or swelling. No visible canal hematoma. Disc levels: Straightening  of cervical lordosis and discogenic degenerative changes with mild disc space narrowing and marginal osteophytes from the C3 through C5 levels. Upper chest: Negative. Other: Negative appear IMPRESSION: 1. Right posterior frontal lobe acute hemorrhage measuring up to 29 mm with small area of surrounding edema and local mass effect. No significant midline shift or herniation. Follow-up to ensure resolution is recommended to exclude an underlying mass. 2. Small volume of subarachnoid hemorrhage overlying the right frontal convexity. 3. Hypoattenuation within subcortical white matter and bilateral parietal and occipital lobes. Findings are suggestive of PRES in the setting of hypertension. This can be further characterized with MRI of the brain. 4. No acute fracture or dislocation of the cervical spine. These results were called by telephone at the time of interpretation on 12/19/2016 at 5:12 pm to Dr. Lajean Saver , who verbally acknowledged these results. Electronically Signed   By: Kristine Garbe M.D.   On: 12/19/2016 17:23   Ct Cervical Spine Wo Contrast  Result Date: 12/19/2016 CLINICAL DATA:  51 y/o  F; seizure. EXAM: CT HEAD WITHOUT CONTRAST CT CERVICAL SPINE WITHOUT CONTRAST TECHNIQUE: Multidetector CT imaging of the head and cervical spine was performed following the standard protocol without intravenous contrast. Multiplanar CT image reconstructions of the cervical spine were also generated. COMPARISON:  02/11/2015 CT of the head. FINDINGS: CT HEAD FINDINGS Brain: Right posterior frontal lobe acute hemorrhage measuring 22 x 29 x 21 mm (AP x ML x CC series 3 image 26 and series 5, image 34). Small volume of subarachnoid hemorrhage with overlying the right frontal convexity and along the falx. There is a small surrounding region of hypoattenuation in the brain compatible with edema with local mass effect  effacing sulci. No significant midline shift. No evidence for herniation. No large territory acute stroke identified. There are foci of hypoattenuation within subcortical white matter in the bilateral parietal lobes and occipital lobes. The distribution is suggestive of PRES. Vascular: Extensive calcific atherosclerosis of the carotid siphons. Skull: Normal. Negative for fracture or focal lesion. Sinuses/Orbits: No acute finding. Other: None. CT CERVICAL SPINE FINDINGS Alignment: Normal. Skull base and vertebrae: No acute fracture. No primary bone lesion or focal pathologic process. Soft tissues and spinal canal: No prevertebral fluid or swelling. No visible canal hematoma. Disc levels: Straightening of cervical lordosis and discogenic degenerative changes with mild disc space narrowing and marginal osteophytes from the C3 through C5 levels. Upper chest: Negative. Other: Negative appear IMPRESSION: 1. Right posterior frontal lobe acute hemorrhage measuring up to 29 mm with small area of surrounding edema and local mass effect. No significant midline shift or herniation. Follow-up to ensure resolution is recommended to exclude an underlying mass. 2. Small volume of subarachnoid hemorrhage overlying the right frontal convexity. 3. Hypoattenuation within subcortical white matter and bilateral parietal and occipital lobes. Findings are suggestive of PRES in the setting of hypertension. This can be further characterized with MRI of the brain. 4. No acute fracture or dislocation of the cervical spine. These results were called by telephone at the time of interpretation on 12/19/2016 at 5:12 pm to Dr. Lajean Saver , who verbally acknowledged these results. Electronically Signed   By: Kristine Garbe M.D.   On: 12/19/2016 17:23    Assessment: 51 year old peritoneal dialysis patient presenting with new onset seizure and left hemiplegia. CT shows acute moderate sized right frontoparietal hemorrhage and small amount  of subarachnoid blood.  1. Also noted on CT is hypoattenuation within subcortical white matter and bilateral parietal and  occipital lobes, suggestive of PRES. This would provide a unifying etiology for her presentation, with malignant HTN leading to both PRES with seizure as well as hypertensive right frontal lobe hemorrhage.  2. CTA head and neck reveals no vascular explanation for the right frontal parietal hematoma. Dural venous sinuses are patent and there is no evidence of vascular malformation. The 16 cc hematoma is mildly increased from earlier CT. Negative for shift or herniation.  3. ICH score = 1 4. Electrolyte disturbance, elevated lactate, elevated Hgb of 20.4 suggestive of hemoconcentration.  5. MRI reveals findings of posterior reversible encephalopathy syndrome. Size-stable posterior right frontal hematoma compared to CTA 1 hour prior. 6. No evidence for ongoing seizures.   Recommendations: 1. STAT Neurosurgery consultation re: possible need for emergent craniectomy if repeat CT at 5 AM shows mass effect or increased hematoma size. 2. Frequent neuro checks in the ICU setting. Call neurology if there are any acute changes.  3. BP management with nicardipine 4. Continue Keppra at scheduled dose of 500 mg IV BID 5. Will need repeat CT head at 5 AM (12 hours after initial CT) 6. No antiplatelet medications or anticoagulation. DVT prophylaxis with SCDs 7. Will need to be admitted to ICU under CCM. Has complex metabolic picture in addition to hematoma, as well as respiratory compromise requiring O2 supplementation. May need to be intubated.   8. May need hypertonic saline if repeat CT shows significant developing mass effect. 9. Discussed with Dr. Ashok Cordia.  40 minutes spent in the emergent neurological evaluation and management of this acutely ill ICH patient  Electronically signed: Dr. Kerney Elbe 12/19/2016, 7:28 PM

## 2016-12-19 NOTE — ED Notes (Signed)
Called pharmacy to release [place]

## 2016-12-19 NOTE — ED Notes (Signed)
Called CT.  They will call when room ready.

## 2016-12-19 NOTE — ED Notes (Signed)
Pt to MRI

## 2016-12-19 NOTE — ED Notes (Signed)
Resident at [facility]

## 2016-12-19 NOTE — ED Notes (Signed)
Delay in lab draw,  Pt enroute to MRI

## 2016-12-19 NOTE — ED Notes (Signed)
Repaged Nephrology

## 2016-12-19 NOTE — Consult Note (Signed)
PULMONARY / CRITICAL CARE MEDICINE   Name: Terry Juarez MRN: [id number] DOB: Jun 26, 1966    ADMISSION DATE:  12/19/2016 CONSULTATION DATE: Dr Ashok Cordia  CHIEF COMPLAINT: ICH  HISTORY OF PRESENT ILLNESS:   51yoF with history of ESRD on Peritoneal Dialysis, HTN, Right renal mass, and Anemia, who was last seen in her normal state of health today at 11am. She was found down on the floor seizing later in the day by a person who rents a room from her. She was brought to the ER at approx 4:45pm at which time patient c/o acute left-sided weakness and right frontal HA. Head CT showed large 67mm right frontal hemorrhage with surrounding edema and mass effect, also with small SAH and signs of PRES. CTA Head/Neck showed no AVM's but but show the hematoma was mildly increased from the earlier imaging. Neurology saw patient in the ER and started Nicardipine and Keppra. She was also found to have hyperkalemia which was medically treated. Brain MRI showed no changes in the size of right frontal hematoma compared to CTA; it also confirmed PRES.  On my interview of patient, she c/o bilateral HA. She says her last PD session was last night and that it was a full session. She says her peritoneal fluid is clear. She was previously on hemodialysis but converted to PD because she wanted to be able to work during the day. She has a RUE fistula with a good thrill. She says her BP at home normally runs in 180's but that recently its been running higher. She says she has been compliant with her BP medications and last took them last night.    PAST MEDICAL HISTORY :  She  has a past medical history of Anemia; Bruises easily; Dialysis patient [facility]); ESRD on dialysis [facility]); Hyperlipidemia; Hypertension; Renal disorder; and Right renal mass.  PAST SURGICAL HISTORY: She  has a past surgical history that includes Ectopic pregnancy surgery (1987); Insertion of dialysis catheter (Right, 07/11/2014); AV fistula placement (Right,  07/11/2014); Thrombectomy w/ embolectomy (Right, 07/11/2014); Patch angioplasty (Right, 07/11/2014); Laparoscopic nephrectomy (Right, 07/25/2014); Esophagogastroduodenoscopy (N/A, 07/30/2016); Colonoscopy with propofol (N/A, 09/04/2016); and Esophagogastroduodenoscopy (egd) with propofol (N/A, 09/04/2016).  No Known Allergies  No current facility-administered medications on file prior to encounter.    Current Outpatient Prescriptions on File Prior to Encounter  Medication Sig  . acetaminophen (TYLENOL) 500 MG tablet Take 1,000 mg by mouth every 6 (six) hours as needed for moderate pain or headache.  Marland Kitchen amLODipine (NORVASC) 10 MG tablet Take 10 mg by mouth daily.   . calcitRIOL (ROCALTROL) 0.5 MCG capsule Take 0.5 mcg by mouth every Monday, Wednesday, and Friday.  . calcium acetate (PHOSLO) 667 MG capsule Take 667-2,001 mg by mouth 3 (three) times daily with meals. Take 2001 mg three times a day with meals  Take 667 mg with snacks twice daily  . losartan (COZAAR) 100 MG tablet Take 100 mg by mouth at bedtime.   . multivitamin (RENA-VIT) TABS tablet Take 1 tablet by mouth daily.  . pantoprazole (PROTONIX) 40 MG tablet Take 1 tablet (40 mg total) by mouth 2 (two) times daily.  . SENSIPAR 60 MG tablet Take 60 mg by mouth at bedtime.   . sodium bicarbonate 650 MG tablet Take 650 mg by mouth 2 (two) times daily.   FAMILY HISTORY:  Her indicated that her mother is deceased. She indicated that her father is alive. She indicated that her brother is alive.   SOCIAL HISTORY: She  reports that  she has never smoked. She has never used smokeless tobacco. She reports that she drinks alcohol. She reports that she does not use drugs.  REVIEW OF SYSTEMS:   Unable to be obtained  VITAL SIGNS: BP 130/62   Pulse 79   Temp 97.8 F (36.6 C) (Rectal)   Resp 17   SpO2 96%   HEMODYNAMICS:  2 PIV's (18G and 20G in LUE) RUE AV fistula   INTAKE / OUTPUT: No intake/output data recorded.  PHYSICAL  EXAMINATION: General: WDWN AAF, Awake but slightly sleepy appearing, in NAD Neuro: AAOx3, Pupils 67mm and equally reactive b/l, EOMI, No facial asymmetry. Sensation intact BUE and BLE. Motor 5/5 RUE and RLE. Motor 0.5 LUE and LLE.  HEENT: OP clear; MM moist Cardiovascular: RRR, no m/r/g; RUE AV fistula with good thrill Lungs: CTA b/l Abdomen: soft flat NTND, BS normoactive. Peritoneal dialysis catheter in place and appears clean. No surrounding erythema.  Musculoskeletal: no clubbing/cyanosis/edema Skin: no raches  LABS:  BMET  Recent Labs Lab 12/19/16 1648 12/19/16 1657  NA 141 135  K 6.0* 8.2*  CL 103 112*  CO2 13*  --   BUN 95* 134*  CREATININE 13.97* 12.90*  GLUCOSE 101* 106*   Electrolytes  Recent Labs Lab 12/19/16 1648  CALCIUM 8.5*   CBC  Recent Labs Lab 12/19/16 1648 12/19/16 1657  WBC 9.1  --   HGB 18.7* 20.4*  HCT 58.6* 60.0*  PLT 239  --    Coag's  Recent Labs Lab 12/19/16 1818  INR 0.97   Sepsis Markers  Recent Labs Lab 12/19/16 1658  LATICACIDVEN 5.86*   ABG No results for input(s): PHART, PCO2ART, PO2ART in the last 168 hours.  Liver Enzymes  Recent Labs Lab 12/19/16 1648  AST 82*  ALT 21  ALKPHOS 43  BILITOT 0.9  ALBUMIN 3.6   Cardiac Enzymes No results for input(s): TROPONINI, PROBNP in the last 168 hours.  Glucose  Recent Labs Lab 12/19/16 1644  GLUCAP 119*    Imaging Ct Angio Head W Or Wo Contrast  Result Date: 12/19/2016 CLINICAL DATA:  Intraparenchymal and subarachnoid hemorrhage. EXAM: CT ANGIOGRAPHY HEAD AND NECK TECHNIQUE: Multidetector CT imaging of the head and neck was performed using the standard protocol during bolus administration of intravenous contrast. Multiplanar CT image reconstructions and MIPs were obtained to evaluate the vascular anatomy. Carotid stenosis measurements (when applicable) are obtained utilizing NASCET criteria, using the distal internal carotid diameter as the denominator.  CONTRAST:  50 cc Isovue 370 intravenous COMPARISON:  Noncontrast head CT from earlier today FINDINGS: CTA NECK FINDINGS Aortic arch: Atheromatous wall thickening that is mild. No acute finding or aneurysm. Right carotid system: Mild atheromatous plaque at the common carotid bifurcation without stenosis or ulceration. Beaded ICA consistent with fibromuscular dysplasia. No superimposed acute dissection. Left carotid system: Scattered atheromatous plaque on the distal common carotid and proximal ICA, moderate for age. No flow limiting stenosis or ulceration. Beading of the left ICA consistent with fibromuscular dysplasia. No superimposed dissection. Vertebral arteries: Proximal subclavian atherosclerosis without flow limiting stenosis. Both vertebral arteries are smooth and widely patent to the dura. Left vertebral dominance. Skeleton: No acute or aggressive finding. Other neck: Negative for mass or inflammation. Upper chest: Negative Review of the MIP images confirms the above findings CTA HEAD FINDINGS Anterior circulation: Moderate calcified plaque on the carotid siphons without flow limiting stenosis. Standard branching. There is no evidence of aneurysm or vascular malformation. No major branch occlusion or proximal flow limiting stenosis. Posterior  circulation: Left dominant vertebral artery. The vertebral and basilar arteries are smooth and widely patent. Aplastic right and mildly hypoplastic left P1 segments with large posterior communicating arteries. No branch occlusion, stenosis, aneurysm, or evidence of vascular malformation. Venous sinuses: Patent on the delayed phase. Anatomic variants: Fetal type PCA circulation, especially on the right. Delayed phase: No masslike enhancement. Pending brain MRI. The right frontal parietal hematoma is larger than before, measuring 27 x 34 x 35 mm as compared to 20 x 31 x 28 mm. Local subarachnoid hemorrhage and mild cerebral edema without shift or herniation. Review of the  MIP images confirms the above findings IMPRESSION: 1. No vascular explanation for the right frontal parietal hematoma. Dural venous sinuses are patent and there is no evidence of vascular malformation. 2. The 16 cc hematoma is mildly increased from earlier CT. Negative for shift or herniation. 3. Fibromuscular dysplasia of the bilateral cervical ICA. 4. Moderate atherosclerosis for age. Negative for flow limiting stenosis. Electronically Signed   By: Monte Fantasia M.D.   On: 12/19/2016 19:37   Ct Head Wo Contrast  Result Date: 12/19/2016 CLINICAL DATA:  51 y/o  F; seizure. EXAM: CT HEAD WITHOUT CONTRAST CT CERVICAL SPINE WITHOUT CONTRAST TECHNIQUE: Multidetector CT imaging of the head and cervical spine was performed following the standard protocol without intravenous contrast. Multiplanar CT image reconstructions of the cervical spine were also generated. COMPARISON:  02/11/2015 CT of the head. FINDINGS: CT HEAD FINDINGS Brain: Right posterior frontal lobe acute hemorrhage measuring 22 x 29 x 21 mm (AP x ML x CC series 3 image 26 and series 5, image 34). Small volume of subarachnoid hemorrhage with overlying the right frontal convexity and along the falx. There is a small surrounding region of hypoattenuation in the brain compatible with edema with local mass effect effacing sulci. No significant midline shift. No evidence for herniation. No large territory acute stroke identified. There are foci of hypoattenuation within subcortical white matter in the bilateral parietal lobes and occipital lobes. The distribution is suggestive of PRES. Vascular: Extensive calcific atherosclerosis of the carotid siphons. Skull: Normal. Negative for fracture or focal lesion. Sinuses/Orbits: No acute finding. Other: None. CT CERVICAL SPINE FINDINGS Alignment: Normal. Skull base and vertebrae: No acute fracture. No primary bone lesion or focal pathologic process. Soft tissues and spinal canal: No prevertebral fluid or swelling.  No visible canal hematoma. Disc levels: Straightening of cervical lordosis and discogenic degenerative changes with mild disc space narrowing and marginal osteophytes from the C3 through C5 levels. Upper chest: Negative. Other: Negative appear IMPRESSION: 1. Right posterior frontal lobe acute hemorrhage measuring up to 29 mm with small area of surrounding edema and local mass effect. No significant midline shift or herniation. Follow-up to ensure resolution is recommended to exclude an underlying mass. 2. Small volume of subarachnoid hemorrhage overlying the right frontal convexity. 3. Hypoattenuation within subcortical white matter and bilateral parietal and occipital lobes. Findings are suggestive of PRES in the setting of hypertension. This can be further characterized with MRI of the brain. 4. No acute fracture or dislocation of the cervical spine. These results were called by telephone at the time of interpretation on 12/19/2016 at 5:12 pm to Dr. Lajean Saver , who verbally acknowledged these results. Electronically Signed   By: Kristine Garbe M.D.   On: 12/19/2016 17:23   Ct Angio Neck W And/or Wo Contrast  Result Date: 12/19/2016 CLINICAL DATA:  Intraparenchymal and subarachnoid hemorrhage. EXAM: CT ANGIOGRAPHY HEAD AND NECK TECHNIQUE: Multidetector CT  imaging of the head and neck was performed using the standard protocol during bolus administration of intravenous contrast. Multiplanar CT image reconstructions and MIPs were obtained to evaluate the vascular anatomy. Carotid stenosis measurements (when applicable) are obtained utilizing NASCET criteria, using the distal internal carotid diameter as the denominator. CONTRAST:  50 cc Isovue 370 intravenous COMPARISON:  Noncontrast head CT from earlier today FINDINGS: CTA NECK FINDINGS Aortic arch: Atheromatous wall thickening that is mild. No acute finding or aneurysm. Right carotid system: Mild atheromatous plaque at the common carotid bifurcation  without stenosis or ulceration. Beaded ICA consistent with fibromuscular dysplasia. No superimposed acute dissection. Left carotid system: Scattered atheromatous plaque on the distal common carotid and proximal ICA, moderate for age. No flow limiting stenosis or ulceration. Beading of the left ICA consistent with fibromuscular dysplasia. No superimposed dissection. Vertebral arteries: Proximal subclavian atherosclerosis without flow limiting stenosis. Both vertebral arteries are smooth and widely patent to the dura. Left vertebral dominance. Skeleton: No acute or aggressive finding. Other neck: Negative for mass or inflammation. Upper chest: Negative Review of the MIP images confirms the above findings CTA HEAD FINDINGS Anterior circulation: Moderate calcified plaque on the carotid siphons without flow limiting stenosis. Standard branching. There is no evidence of aneurysm or vascular malformation. No major branch occlusion or proximal flow limiting stenosis. Posterior circulation: Left dominant vertebral artery. The vertebral and basilar arteries are smooth and widely patent. Aplastic right and mildly hypoplastic left P1 segments with large posterior communicating arteries. No branch occlusion, stenosis, aneurysm, or evidence of vascular malformation. Venous sinuses: Patent on the delayed phase. Anatomic variants: Fetal type PCA circulation, especially on the right. Delayed phase: No masslike enhancement. Pending brain MRI. The right frontal parietal hematoma is larger than before, measuring 27 x 34 x 35 mm as compared to 20 x 31 x 28 mm. Local subarachnoid hemorrhage and mild cerebral edema without shift or herniation. Review of the MIP images confirms the above findings IMPRESSION: 1. No vascular explanation for the right frontal parietal hematoma. Dural venous sinuses are patent and there is no evidence of vascular malformation. 2. The 16 cc hematoma is mildly increased from earlier CT. Negative for shift or  herniation. 3. Fibromuscular dysplasia of the bilateral cervical ICA. 4. Moderate atherosclerosis for age. Negative for flow limiting stenosis. Electronically Signed   By: Monte Fantasia M.D.   On: 12/19/2016 19:37   Ct Cervical Spine Wo Contrast  Result Date: 12/19/2016 CLINICAL DATA:  51 y/o  F; seizure. EXAM: CT HEAD WITHOUT CONTRAST CT CERVICAL SPINE WITHOUT CONTRAST TECHNIQUE: Multidetector CT imaging of the head and cervical spine was performed following the standard protocol without intravenous contrast. Multiplanar CT image reconstructions of the cervical spine were also generated. COMPARISON:  02/11/2015 CT of the head. FINDINGS: CT HEAD FINDINGS Brain: Right posterior frontal lobe acute hemorrhage measuring 22 x 29 x 21 mm (AP x ML x CC series 3 image 26 and series 5, image 34). Small volume of subarachnoid hemorrhage with overlying the right frontal convexity and along the falx. There is a small surrounding region of hypoattenuation in the brain compatible with edema with local mass effect effacing sulci. No significant midline shift. No evidence for herniation. No large territory acute stroke identified. There are foci of hypoattenuation within subcortical white matter in the bilateral parietal lobes and occipital lobes. The distribution is suggestive of PRES. Vascular: Extensive calcific atherosclerosis of the carotid siphons. Skull: Normal. Negative for fracture or focal lesion. Sinuses/Orbits: No acute finding. Other:  None. CT CERVICAL SPINE FINDINGS Alignment: Normal. Skull base and vertebrae: No acute fracture. No primary bone lesion or focal pathologic process. Soft tissues and spinal canal: No prevertebral fluid or swelling. No visible canal hematoma. Disc levels: Straightening of cervical lordosis and discogenic degenerative changes with mild disc space narrowing and marginal osteophytes from the C3 through C5 levels. Upper chest: Negative. Other: Negative appear IMPRESSION: 1. Right  posterior frontal lobe acute hemorrhage measuring up to 29 mm with small area of surrounding edema and local mass effect. No significant midline shift or herniation. Follow-up to ensure resolution is recommended to exclude an underlying mass. 2. Small volume of subarachnoid hemorrhage overlying the right frontal convexity. 3. Hypoattenuation within subcortical white matter and bilateral parietal and occipital lobes. Findings are suggestive of PRES in the setting of hypertension. This can be further characterized with MRI of the brain. 4. No acute fracture or dislocation of the cervical spine. These results were called by telephone at the time of interpretation on 12/19/2016 at 5:12 pm to Dr. Lajean Saver , who verbally acknowledged these results. Electronically Signed   By: Kristine Garbe M.D.   On: 12/19/2016 17:23   LINES/TUBES: Peritoneal dialysis catheter RUE Fistula LUE PIV's   ASSESSMENT / PLAN: 50yoF with history of ESRD on Peritoneal Dialysis, HTN, Right renal mass, and Anemia, who was found down on the floor seizing today and found to have a large right frontal hemorrhage with surrounding edema and mass effect, also with small SAH and signs of PRES.  PULMONARY No active issues  CARDIOVASCULAR 1. HTN emergency with PRES: - BP up to 198/117 recorded here, but unclear how high they may have been prior to arrival; likely contributed to her intracranial hemorrhage - continue nicardipine gtt  RENAL 1. ESRD on PD; Hyperkalemia; Metabolic acidosis - hyperkalemia of 8.2 was medically treated with Insulin/D50 and Calcium - recheck BMP - Consult Nephrology - ABG shows metabolic acidosis likely related to the renal failure, especially given that the lactic acidosis has resolved. - Likely needs dialysis tonight  HEMATOLOGIC 1. History of Anemia: not anemic now with Hgb 20; question if intravasculary dry and therefore concentrated though; will continue to monitor  NEUROLOGIC 1.  Intracranial hemorrhage; SAH; PRES: - CTA showed no signs of AVM, but hemorrhage appears to be expanding in size compared to prior imaging - Brain MRI showed no change in the size of the frontal hematoma; it did show signs consistent with PRES - Neurology following and started Nicarpidine and [place] - Neurosurgery was consulted; Neurology is also following. - Consult PT/OT and Speech - Repeat Head CT on 6/16 at 5am (12hrs from first CT)   FAMILY  - Updates: patient's father updated at bedside - Patient states that she is [place], MD Pulmonary and [place] Pager: [phone]  12/19/2016, 7:59 PM

## 2016-12-19 NOTE — ED Notes (Signed)
Pt CBG was 119, notified Brenda(RN)

## 2016-12-19 NOTE — ED Notes (Signed)
Pt. transported to MRI with trauma charge nurse .

## 2016-12-19 NOTE — ED Provider Notes (Signed)
[facility] DEPT Provider Note   CSN: [id number] Arrival date & time: 12/19/16  1638     History   Chief Complaint Chief Complaint  Patient presents with  . Seizures    HPI Terry Juarez is a 51 y.o. female.  The history is provided by the patient, the EMS personnel and a relative. The history is limited by the condition of the patient.   Patient is a 51 year old female with past medical history significant for hypertension, ESRD on peritoneal dialysis, who presents to the emergency department following multiple seizure episodes. No prior history of epilepsy. Patient states that she started having shaking to her right upper extremity. States that she did not feel well, went to the bathroom but was "unable to move at all". States that she crawled to her neighbors door. When she stood up to knock on the door her neighbor noted that she lost consciousness and had generalized shaking. Was not responding to her name at that time. Eyes deviated backwards, no tongue biting or incontinence. When EMS arrived patient was not actively seizing. Initial seizure lasted for approximately 5 minutes. Patient was confused and then had 2 more seizure-like activities with EMS, each lasting 2-5 minutes. Patient was able to answer some questions between seizure. Received 5 mg IV Versed with resolution of the seizure. Denies fever, nausea, vomiting. Denies recent head trauma or falls. States she is unable to move her left arm or bilateral lower extremities.  Past Medical History:  Diagnosis Date  . Anemia   . Bruises easily   . Dialysis patient ([place])   . ESRD on dialysis ([place])   . Hyperlipidemia   . Hypertension   . Renal disorder    rt renal mass / < functioning of left kidney - being prepared for possible dialysis  . Right renal mass     Patient Active Problem List   Diagnosis Date Noted  . Intracranial hemorrhage ([place]) 12/19/2016  . Subarachnoid hemorrhage 12/19/2016  . Intracerebral  hemorrhage 12/19/2016  . Symptomatic anemia 07/29/2016  . Cough 01/09/2016  . Elevated troponin 02/12/2015  . Headache 02/12/2015  . Syncope 02/12/2015  . ESRD on dialysis ([place]) 02/12/2015  . Hypertension   . Hyperlipidemia   . Anemia   . Essential hypertension   . Cephalalgia   . Right renal mass 07/25/2014    Past Surgical History:  Procedure Laterality Date  . AV FISTULA PLACEMENT Right 07/11/2014   Procedure: ARTERIOVENOUS (AV) FISTULA CREATION RIGHT ARM BRACHIO-CEPHALIC WITH ATTEMPTED RADIO-CEPHALIC (AV) FISTULA;  Surgeon: Mal Misty, MD;  Location: [facility];  Service: Vascular;  Laterality: Right;  . COLONOSCOPY WITH PROPOFOL N/A 09/04/2016   Procedure: COLONOSCOPY WITH PROPOFOL;  Surgeon: Carol Ada, MD;  Location: [facility];  Service: Endoscopy;  Laterality: N/A;  . ECTOPIC PREGNANCY SURGERY  1987  . ESOPHAGOGASTRODUODENOSCOPY N/A 07/30/2016   Procedure: ESOPHAGOGASTRODUODENOSCOPY (EGD);  Surgeon: Carol Ada, MD;  Location: [facility] ENDOSCOPY;  Service: Endoscopy;  Laterality: N/A;  Bedside  . ESOPHAGOGASTRODUODENOSCOPY (EGD) WITH PROPOFOL N/A 09/04/2016   Procedure: ESOPHAGOGASTRODUODENOSCOPY (EGD) WITH PROPOFOL;  Surgeon: Carol Ada, MD;  Location: [facility];  Service: Endoscopy;  Laterality: N/A;  . INSERTION OF DIALYSIS CATHETER Right 07/11/2014   Procedure: INSERTION OF DIALYSIS CATHETER IN RIGHT INTERNAL JUGULAR ;  Surgeon: Mal Misty, MD;  Location: [facility];  Service: Vascular;  Laterality: Right;  . LAPAROSCOPIC NEPHRECTOMY Right 07/25/2014   Procedure: RIGHT LAPAROSCOPIC RADICAL NEPHRECTOMY ;  Surgeon: Ardis Hughs, MD;  Location: [facility];  Service: Urology;  Laterality: Right;  . PATCH ANGIOPLASTY Right 07/11/2014   Procedure: PATCH ANGIOPLASTY OF RIGHT RADIAL ARTERY USING CEPHALIC VEIN.;  Surgeon: Mal Misty, MD;  Location: [facility];  Service: Vascular;  Laterality: Right;  . THROMBECTOMY W/ EMBOLECTOMY Right 07/11/2014   Procedure: THROMBECTOMY OF RIGHT RADIAL  ARTERY  ;  Surgeon: Mal Misty, MD;  Location: [facility];  Service: Vascular;  Laterality: Right;    OB History    No data available       Home Medications    Prior to Admission medications   Medication Sig Start Date End Date Taking? Authorizing Provider  acetaminophen (TYLENOL) 500 MG tablet Take 1,000 mg by mouth every 6 (six) hours as needed for moderate pain or headache.   Yes [provider]  amLODipine (NORVASC) 10 MG tablet Take 10 mg by mouth daily.    Yes [provider]  calcitRIOL (ROCALTROL) 0.5 MCG capsule Take 0.5 mcg by mouth every Monday, Wednesday, and Friday.   Yes [provider]  calcium acetate (PHOSLO) 667 MG capsule Take 667-2,001 mg by mouth See admin instructions. Take 3 capsules (2001 mg) by mouth three times daily with meals and take 1 capsule (667 mg) twice daily with snacks   Yes [provider]  carvedilol (COREG) 12.5 MG tablet Take 12.5 mg by mouth 2 (two) times daily. 11/23/16  Yes [provider]  cinacalcet (SENSIPAR) 60 MG tablet Take 60 mg by mouth at bedtime.   Yes [provider]  cloNIDine (CATAPRES) 0.1 MG tablet Take 0.1 mg by mouth 3 (three) times daily as needed (SBP >180).  12/16/16  Yes [provider]  levothyroxine (SYNTHROID, LEVOTHROID) 88 MCG tablet Take 88 mcg by mouth daily before breakfast.  10/29/16  Yes [provider]  losartan (COZAAR) 100 MG tablet Take 100 mg by mouth at bedtime.  07/11/16  Yes [provider]  multivitamin (RENA-VIT) TABS tablet Take 1 tablet by mouth daily.   Yes [provider]  [place] Calcium 500 MG TABS Take 500 mg by mouth 3 (three) times daily. 10/29/16  Yes [provider]  sodium bicarbonate 650 MG tablet Take 650 mg by mouth 2 (two) times daily.   Yes [provider]  pantoprazole (PROTONIX) 40 MG tablet Take 1 tablet (40 mg total) by mouth 2 (two) times daily. Patient not taking: Reported on  12/19/2016 08/02/16   Bonnielee Haff, MD    Family History Family History  Problem Relation Age of Onset  . Throat cancer Mother        smoked  . Hypertension Father     Social History Social History  Substance Use Topics  . Smoking status: Never Smoker  . Smokeless tobacco: Never Used  . Alcohol use 0.0 oz/week     Comment: occ     Allergies   Lisinopril   Review of Systems Review of Systems  Constitutional: Negative for appetite change and fever.  HENT: Negative for congestion.   Respiratory: Negative for cough, chest tightness and shortness of breath.   Cardiovascular: Negative for chest pain.  Gastrointestinal: Negative for abdominal pain, diarrhea and vomiting.  Genitourinary: Negative for dysuria and hematuria.  Musculoskeletal: Negative for back pain.  Skin: Negative for rash.  Neurological: Positive for seizures and weakness. Negative for dizziness, syncope, light-headedness and numbness.  Psychiatric/Behavioral: Negative for behavioral problems.     Physical Exam Updated Vital Signs BP 140/80   Pulse 98   Temp  98.2 F (36.8 C)   Resp 18   SpO2 100%   Physical Exam  Constitutional: She is oriented to person, place, and time. She appears well-developed and well-nourished.  HENT:  Head: Atraumatic.  Eyes: Conjunctivae and EOM are normal. Pupils are equal, round, and reactive to light.  Neck: Normal range of motion. Neck supple. No JVD present.  Cardiovascular: Normal rate, regular rhythm, normal heart sounds and intact distal pulses.   Pulmonary/Chest: Effort normal and breath sounds normal. No respiratory distress. She has no wheezes.  Abdominal: There is no tenderness.  PD catheter in place.  Musculoskeletal: Normal range of motion.  Neurological: She is alert and oriented to person, place, and time. She has normal reflexes. No cranial nerve deficit or sensory deficit. GCS eye subscore is 4. GCS verbal subscore is 5. GCS motor subscore is 6.  5/5  right upper extremity and right lower extremity, 1/5 left upper and lower extremity  Skin: Skin is warm. Capillary refill takes less than 2 seconds. No pallor.  Psychiatric: She has a normal mood and affect.     ED Treatments / Results  Labs (all labs ordered are listed, but only abnormal results are displayed) Labs Reviewed  CBC WITH DIFFERENTIAL/PLATELET - Abnormal; Notable for the following:       Result Value   RBC 5.62 (*)    Hemoglobin 18.7 (*)    HCT 58.6 (*)    MCV 104.3 (*)    All other components within normal limits  RAPID URINE DRUG SCREEN, HOSP PERFORMED - Abnormal; Notable for the following:    Benzodiazepines POSITIVE (*)    All other components within normal limits  COMPREHENSIVE METABOLIC PANEL - Abnormal; Notable for the following:    Potassium 6.0 (*)    CO2 13 (*)    Glucose, Bld 101 (*)    BUN 95 (*)    Creatinine, Ser 13.97 (*)    Calcium 8.5 (*)    Total Protein 6.4 (*)    AST 82 (*)    GFR calc non Af Amer 3 (*)    GFR calc Af Amer 3 (*)    Anion gap 25 (*)    All other components within normal limits  CBC WITH DIFFERENTIAL/PLATELET - Abnormal; Notable for the following:    RBC 2.62 (*)    Hemoglobin 8.3 (*)    HCT 29.7 (*)    MCV 113.4 (*)    MCHC 27.9 (*)    RDW 16.1 (*)    Platelets 109 (*)    Lymphs Abs 0.4 (*)    All other components within normal limits  PROTIME-INR - Abnormal; Notable for the following:    Prothrombin Time 21.9 (*)    All other components within normal limits  BASIC METABOLIC PANEL - Abnormal; Notable for the following:    CO2 18 (*)    BUN 90 (*)    Creatinine, Ser 13.31 (*)    Calcium 8.5 (*)    GFR calc non Af Amer 3 (*)    GFR calc Af Amer 3 (*)    Anion gap 17 (*)    All other components within normal limits  TROPONIN I - Abnormal; Notable for the following:    Troponin I 1.21 (*)    All other components within normal limits  CBG MONITORING, ED - Abnormal; Notable for the following:    Glucose-Capillary  119 (*)    All other components within normal limits  I-STAT CHEM 8, ED -  Abnormal; Notable for the following:    Potassium 8.2 (*)    Chloride 112 (*)    BUN 134 (*)    Creatinine, Ser 12.90 (*)    Glucose, Bld 106 (*)    Calcium, Ion 0.83 (*)    Hemoglobin 20.4 (*)    HCT 60.0 (*)    All other components within normal limits  I-STAT CG4 LACTIC ACID, ED - Abnormal; Notable for the following:    Lactic Acid, Venous 5.86 (*)    All other components within normal limits  I-STAT TROPOININ, ED - Abnormal; Notable for the following:    Troponin i, poc 0.22 (*)    All other components within normal limits  I-STAT BETA HCG BLOOD, ED ([facility], [facility], [facility] ONLY) - Abnormal; Notable for the following:    I-stat hCG, quantitative 6.1 (*)    All other components within normal limits  I-STAT ARTERIAL BLOOD GAS, ED - Abnormal; Notable for the following:    pH, Arterial 7.261 (*)    pO2, Arterial 133.0 (*)    Bicarbonate 18.6 (*)    Acid-base deficit 8.0 (*)    All other components within normal limits  PROTIME-INR  BASIC METABOLIC PANEL  HIV ANTIBODY (ROUTINE TESTING)  CBC  BASIC METABOLIC PANEL  BLOOD GAS, ARTERIAL  MAGNESIUM  PHOSPHORUS  I-STAT CG4 LACTIC ACID, ED    EKG  EKG Interpretation  Date/Time:  Saturday December 19 2016 16:45:04 EDT Ventricular Rate:  97 PR Interval:    QRS Duration: 93 QT Interval:  377 QTC Calculation: 479 R Axis:   66 Text Interpretation:  Sinus rhythm Biatrial enlargement Left ventricular hypertrophy `prominent/?peaked t waves Confirmed by Ashok Cordia  MD, Lennette Bihari ([id number]) on 12/19/2016 7:22:40 PM       Radiology Ct Angio Head W Or Wo Contrast  Result Date: 12/19/2016 CLINICAL DATA:  Intraparenchymal and subarachnoid hemorrhage. EXAM: CT ANGIOGRAPHY HEAD AND NECK TECHNIQUE: Multidetector CT imaging of the head and neck was performed using the standard protocol during bolus administration of intravenous contrast. Multiplanar CT image reconstructions and MIPs were  obtained to evaluate the vascular anatomy. Carotid stenosis measurements (when applicable) are obtained utilizing NASCET criteria, using the distal internal carotid diameter as the denominator. CONTRAST:  50 cc Isovue 370 intravenous COMPARISON:  Noncontrast head CT from earlier today FINDINGS: CTA NECK FINDINGS Aortic arch: Atheromatous wall thickening that is mild. No acute finding or aneurysm. Right carotid system: Mild atheromatous plaque at the common carotid bifurcation without stenosis or ulceration. Beaded ICA consistent with fibromuscular dysplasia. No superimposed acute dissection. Left carotid system: Scattered atheromatous plaque on the distal common carotid and proximal ICA, moderate for age. No flow limiting stenosis or ulceration. Beading of the left ICA consistent with fibromuscular dysplasia. No superimposed dissection. Vertebral arteries: Proximal subclavian atherosclerosis without flow limiting stenosis. Both vertebral arteries are smooth and widely patent to the dura. Left vertebral dominance. Skeleton: No acute or aggressive finding. Other neck: Negative for mass or inflammation. Upper chest: Negative Review of the MIP images confirms the above findings CTA HEAD FINDINGS Anterior circulation: Moderate calcified plaque on the carotid siphons without flow limiting stenosis. Standard branching. There is no evidence of aneurysm or vascular malformation. No major branch occlusion or proximal flow limiting stenosis. Posterior circulation: Left dominant vertebral artery. The vertebral and basilar arteries are smooth and widely patent. Aplastic right and mildly hypoplastic left P1 segments with large posterior communicating arteries. No branch occlusion, stenosis, aneurysm, or evidence of vascular malformation. Venous sinuses: Patent  on the delayed phase. Anatomic variants: Fetal type PCA circulation, especially on the right. Delayed phase: No masslike enhancement. Pending brain MRI. The right frontal  parietal hematoma is larger than before, measuring 27 x 34 x 35 mm as compared to 20 x 31 x 28 mm. Local subarachnoid hemorrhage and mild cerebral edema without shift or herniation. Review of the MIP images confirms the above findings IMPRESSION: 1. No vascular explanation for the right frontal parietal hematoma. Dural venous sinuses are patent and there is no evidence of vascular malformation. 2. The 16 cc hematoma is mildly increased from earlier CT. Negative for shift or herniation. 3. Fibromuscular dysplasia of the bilateral cervical ICA. 4. Moderate atherosclerosis for age. Negative for flow limiting stenosis. Electronically Signed   By: Monte Fantasia M.D.   On: 12/19/2016 19:37   Ct Head Wo Contrast  Result Date: 12/19/2016 CLINICAL DATA:  51 y/o  F; seizure. EXAM: CT HEAD WITHOUT CONTRAST CT CERVICAL SPINE WITHOUT CONTRAST TECHNIQUE: Multidetector CT imaging of the head and cervical spine was performed following the standard protocol without intravenous contrast. Multiplanar CT image reconstructions of the cervical spine were also generated. COMPARISON:  02/11/2015 CT of the head. FINDINGS: CT HEAD FINDINGS Brain: Right posterior frontal lobe acute hemorrhage measuring 22 x 29 x 21 mm (AP x ML x CC series 3 image 26 and series 5, image 34). Small volume of subarachnoid hemorrhage with overlying the right frontal convexity and along the falx. There is a small surrounding region of hypoattenuation in the brain compatible with edema with local mass effect effacing sulci. No significant midline shift. No evidence for herniation. No large territory acute stroke identified. There are foci of hypoattenuation within subcortical white matter in the bilateral parietal lobes and occipital lobes. The distribution is suggestive of PRES. Vascular: Extensive calcific atherosclerosis of the carotid siphons. Skull: Normal. Negative for fracture or focal lesion. Sinuses/Orbits: No acute finding. Other: None. CT CERVICAL  SPINE FINDINGS Alignment: Normal. Skull base and vertebrae: No acute fracture. No primary bone lesion or focal pathologic process. Soft tissues and spinal canal: No prevertebral fluid or swelling. No visible canal hematoma. Disc levels: Straightening of cervical lordosis and discogenic degenerative changes with mild disc space narrowing and marginal osteophytes from the C3 through C5 levels. Upper chest: Negative. Other: Negative appear IMPRESSION: 1. Right posterior frontal lobe acute hemorrhage measuring up to 29 mm with small area of surrounding edema and local mass effect. No significant midline shift or herniation. Follow-up to ensure resolution is recommended to exclude an underlying mass. 2. Small volume of subarachnoid hemorrhage overlying the right frontal convexity. 3. Hypoattenuation within subcortical white matter and bilateral parietal and occipital lobes. Findings are suggestive of PRES in the setting of hypertension. This can be further characterized with MRI of the brain. 4. No acute fracture or dislocation of the cervical spine. These results were called by telephone at the time of interpretation on 12/19/2016 at 5:12 pm to Dr. Lajean Saver , who verbally acknowledged these results. Electronically Signed   By: Kristine Garbe M.D.   On: 12/19/2016 17:23   Ct Angio Neck W And/or Wo Contrast  Result Date: 12/19/2016 CLINICAL DATA:  Intraparenchymal and subarachnoid hemorrhage. EXAM: CT ANGIOGRAPHY HEAD AND NECK TECHNIQUE: Multidetector CT imaging of the head and neck was performed using the standard protocol during bolus administration of intravenous contrast. Multiplanar CT image reconstructions and MIPs were obtained to evaluate the vascular anatomy. Carotid stenosis measurements (when applicable) are obtained utilizing NASCET criteria,  using the distal internal carotid diameter as the denominator. CONTRAST:  50 cc Isovue 370 intravenous COMPARISON:  Noncontrast head CT from earlier  today FINDINGS: CTA NECK FINDINGS Aortic arch: Atheromatous wall thickening that is mild. No acute finding or aneurysm. Right carotid system: Mild atheromatous plaque at the common carotid bifurcation without stenosis or ulceration. Beaded ICA consistent with fibromuscular dysplasia. No superimposed acute dissection. Left carotid system: Scattered atheromatous plaque on the distal common carotid and proximal ICA, moderate for age. No flow limiting stenosis or ulceration. Beading of the left ICA consistent with fibromuscular dysplasia. No superimposed dissection. Vertebral arteries: Proximal subclavian atherosclerosis without flow limiting stenosis. Both vertebral arteries are smooth and widely patent to the dura. Left vertebral dominance. Skeleton: No acute or aggressive finding. Other neck: Negative for mass or inflammation. Upper chest: Negative Review of the MIP images confirms the above findings CTA HEAD FINDINGS Anterior circulation: Moderate calcified plaque on the carotid siphons without flow limiting stenosis. Standard branching. There is no evidence of aneurysm or vascular malformation. No major branch occlusion or proximal flow limiting stenosis. Posterior circulation: Left dominant vertebral artery. The vertebral and basilar arteries are smooth and widely patent. Aplastic right and mildly hypoplastic left P1 segments with large posterior communicating arteries. No branch occlusion, stenosis, aneurysm, or evidence of vascular malformation. Venous sinuses: Patent on the delayed phase. Anatomic variants: Fetal type PCA circulation, especially on the right. Delayed phase: No masslike enhancement. Pending brain MRI. The right frontal parietal hematoma is larger than before, measuring 27 x 34 x 35 mm as compared to 20 x 31 x 28 mm. Local subarachnoid hemorrhage and mild cerebral edema without shift or herniation. Review of the MIP images confirms the above findings IMPRESSION: 1. No vascular explanation for the  right frontal parietal hematoma. Dural venous sinuses are patent and there is no evidence of vascular malformation. 2. The 16 cc hematoma is mildly increased from earlier CT. Negative for shift or herniation. 3. Fibromuscular dysplasia of the bilateral cervical ICA. 4. Moderate atherosclerosis for age. Negative for flow limiting stenosis. Electronically Signed   By: Monte Fantasia M.D.   On: 12/19/2016 19:37   Ct Cervical Spine Wo Contrast  Result Date: 12/19/2016 CLINICAL DATA:  51 y/o  F; seizure. EXAM: CT HEAD WITHOUT CONTRAST CT CERVICAL SPINE WITHOUT CONTRAST TECHNIQUE: Multidetector CT imaging of the head and cervical spine was performed following the standard protocol without intravenous contrast. Multiplanar CT image reconstructions of the cervical spine were also generated. COMPARISON:  02/11/2015 CT of the head. FINDINGS: CT HEAD FINDINGS Brain: Right posterior frontal lobe acute hemorrhage measuring 22 x 29 x 21 mm (AP x ML x CC series 3 image 26 and series 5, image 34). Small volume of subarachnoid hemorrhage with overlying the right frontal convexity and along the falx. There is a small surrounding region of hypoattenuation in the brain compatible with edema with local mass effect effacing sulci. No significant midline shift. No evidence for herniation. No large territory acute stroke identified. There are foci of hypoattenuation within subcortical white matter in the bilateral parietal lobes and occipital lobes. The distribution is suggestive of PRES. Vascular: Extensive calcific atherosclerosis of the carotid siphons. Skull: Normal. Negative for fracture or focal lesion. Sinuses/Orbits: No acute finding. Other: None. CT CERVICAL SPINE FINDINGS Alignment: Normal. Skull base and vertebrae: No acute fracture. No primary bone lesion or focal pathologic process. Soft tissues and spinal canal: No prevertebral fluid or swelling. No visible canal hematoma. Disc levels: Straightening of cervical  lordosis  and discogenic degenerative changes with mild disc space narrowing and marginal osteophytes from the C3 through C5 levels. Upper chest: Negative. Other: Negative appear IMPRESSION: 1. Right posterior frontal lobe acute hemorrhage measuring up to 29 mm with small area of surrounding edema and local mass effect. No significant midline shift or herniation. Follow-up to ensure resolution is recommended to exclude an underlying mass. 2. Small volume of subarachnoid hemorrhage overlying the right frontal convexity. 3. Hypoattenuation within subcortical white matter and bilateral parietal and occipital lobes. Findings are suggestive of PRES in the setting of hypertension. This can be further characterized with MRI of the brain. 4. No acute fracture or dislocation of the cervical spine. These results were called by telephone at the time of interpretation on 12/19/2016 at 5:12 pm to Dr. Lajean Saver , who verbally acknowledged these results. Electronically Signed   By: Kristine Garbe M.D.   On: 12/19/2016 17:23   Mr Brain Wo Contrast  Result Date: 12/19/2016 CLINICAL DATA:  Seizure and altered mental status. EXAM: MRI HEAD WITHOUT CONTRAST TECHNIQUE: Multiplanar, multiecho pulse sequences of the brain and surrounding structures were obtained without intravenous contrast. COMPARISON:  CTA head neck from earlier the same day FINDINGS: Brain: Acute parenchymal hematoma in the posterior right frontal lobe is size stable from CT 60 minutes prior. There is subarachnoid FLAIR signal hyperintensity along the right more than left convexity. On the right there is associated susceptibility on gradient imaging correlating with subarachnoid hemorrhage by CT. There is bilateral cortical and subcortical T2 hyperintensity within the predominantly high and posterior cerebral convexities, also seen along the left putamen. No hydrocephalus. No primary infarct findings. Vascular: Major flow voids are preserved. Skull and upper  cervical spine: Negative for marrow lesion Sinuses/Orbits: Negative Other: These results were called by telephone at the time of interpretation on 12/19/2016 at 8:30 pm to Dr. Lajean Saver , who verbally acknowledged these results. IMPRESSION: 1. Findings of posterior reversible encephalopathy syndrome. 2. Size stable posterior right frontal hematoma compared to CTA 1 hour prior. Electronically Signed   By: Monte Fantasia M.D.   On: 12/19/2016 20:35    Procedures Procedures (including critical care time)  Medications Ordered in ED Medications  HYDROmorphone (DILAUDID) injection 0.5 mg (0.5 mg Intravenous Given 12/20/16 0038)  levETIRAcetam (KEPPRA) 500 mg in sodium chloride 0.9 % 100 mL IVPB (not administered)  heparin 1000 unit/ml injection 500 Units (not administered)  gentamicin cream (GARAMYCIN) 0.1 % 1 application (not administered)  dialysis solution 2.5% low-MG/low-CA dianeal solution (not administered)  0.9 %  sodium chloride infusion (not administered)  famotidine (PEPCID) IVPB 20 mg premix (0 mg Intravenous Stopped 12/20/16 0040)  calcium gluconate inj 10% (1 g) URGENT USE ONLY! (1 g Intravenous Given 12/19/16 1714)  insulin aspart (novoLOG) injection 10 Units (10 Units Intravenous Given 12/19/16 1730)  dextrose 50 % solution 50 mL (50 mLs Intravenous Given 12/19/16 1716)  levETIRAcetam (KEPPRA) 1,000 mg in sodium chloride 0.9 % 100 mL IVPB (0 mg Intravenous Stopped 12/19/16 1810)  nicardipine (CARDENE) 20mg  in 0.86% saline 272ml IV infusion (0.1 mg/ml) (0 mg/hr Intravenous Stopped 12/19/16 2252)  iopamidol (ISOVUE-370) 76 % injection (50 mLs Intravenous Contrast Given 12/19/16 1908)  nicardipine (CARDENE) 20mg  in 0.86% saline 272ml IV infusion (0.1 mg/ml) (10 mg/hr Intravenous Rate/Dose Change 12/20/16 0016)     Initial Impression / Assessment and Plan / ED Course  I have reviewed the triage vital signs and the nursing notes.  Pertinent labs & imaging results that were  available during  my care of the patient were reviewed by me and considered in my medical decision making (see chart for details).     Patient is a 51 year old female, past medical history significant for ESRD on PD, HTN, who presents to the emergency Department following 3 seizure-like episodes now with weakness to the left upper and lower extremity. No falls or head trauma. No preceding weakness. No prior history of seizure disorder. No fever. BP 190/110s.   On arrival GCS 15. Neurologic exam notable for decreased motor strength to the left upper and lower extremity and right lower extremity. No signs of head trauma. Patient was emergently taken to the CT scanner, CT head showed right frontal lobe hemorrhage, multiple areas of hypoattenuation, SAH. CT C-spine negative. Concerning for PRES syndrome.  Patient given Keppra IV, started on Cardene infusion with goal SBP 140. ISTAT K 8.0. EKG concerning for hyperkalemia with peaked T's. No signs of acute ischemia. Patient was given IV calcium and temporized with insulin and glucose.  Concerning for hemorrhage secondary to hypertension. Neurology, Dr. Cheral Marker, was consulted for evaluation, evaluated the patient to the emergency department. Nephrology, Dr. Jonnie Finner, consulted due to concern for possible need for emergent dialysis. CTA showed increasing size of hemorrhage without signs of aneurysm. Given the increase in size neurosurgery, Dr. Christella Noa, evaluated the imaging, do not feel that emergent neurosurgical intervention was necessary at this time.  Patient admitted to the ICU for further management. MRI concerning for PRES syndrome. Patient remains GCS 15. Repeat neurologic exam now with only weakness to the left upper and lower extremity.  Final Clinical Impressions(s) / ED Diagnoses   Final diagnoses:  Seizure ([place])  Intracranial hemorrhage ([place])  Subarachnoid hematoma, without loss of consciousness, initial encounter [facility])    New Prescriptions New  Prescriptions   No medications on file     Nathaniel Man, MD 12/20/16 0340    Lajean Saver, MD 12/20/16 1531

## 2016-12-19 NOTE — ED Notes (Signed)
Neurology at bedside.

## 2016-12-19 NOTE — ED Triage Notes (Signed)
PT here via [facility] after being found seizing by renter.  No hx of.  Pt on peritoneal dialysis.  Given 5 mg of versed en-route to tx seizures.  Pt hypertensive with GCS of 9.  CBG 129.

## 2016-12-19 NOTE — Progress Notes (Signed)
Films reviewed. No indication at this time for surgical evacuation. It is known that [place] caught early frequently expand on serial ct's. This is not a prognostic phenomenon but a characteristic of hypertensive hemorrhages. If there are questions please feel free to call. No mass effect, or impending or threatened herniation, no intraventricular expansion.

## 2016-12-19 NOTE — Consult Note (Addendum)
Renal Service Consult Note [facility]  Terry Juarez 12/19/2016 [place] Requesting Physician:  Dr Ashok Cordia  Reason for Consult:  ESRD pt with IC bleed and hyperkalemia HPI: The patient is a 51 y.o. year-old w/ history of HTN, ESRD on HD, R renal cancer s/p nephrectomy, HL and anemia was found having seizures by landlord.  Pt on peritoneal dialysis. Brought to ED by EMS. Per history per pt she had several seizure episodes w/ shaking or RUE, LOC and generalized shaking.  With EMS had 2 more seizures each lasting 2-5 minutes, rec'd 5 mg IV versed with resolution of seizures.  Pt denied recent falls or trauma.  Head CT showed R posterior frontal lobe acute hemorrhage about 3 cm in length w small area surrouding edema and local mass effect.  Seen by neurosurg, no indication for surgery at this time.  Labs showed K 6.0.  Asked to see for ESRD.  Patient is awake and alert, can't move her L arm or L leg.  K on cmet at 4 pm was 6.0, she rec'd IV Cagluc/ IV insulin and dextrose around 5pm .  Another K+ istat done at the same time showed K+ 8.2.  Right now at 9:55 pm the tele tracing is showing a very narrow QRS and repeat K is pending, due back in 30 min.    Pt was on HD for 1 year and has been on PD for 1 year now.  No hx transplant.  Hx HTN.  No DM.    Home meds > norvasc,rocaltrol, phoslo, coreg, sensipar, catapres, T4, cozaar, sod bicarb, PPI, MVI    - May '12  Uterine fibroids, underwent exlap with myomectomy, R SPO, LIA and uterine suspension - Jan '16  Enhancing R renal mass, CKD creat 3.5 , underwent R lap nephrectomy w/ ^ in creat to 5.5 , did not require dialysis - Aug '16  HA, syncope, ^trop, HTN, n/v > CT head negative, EkG changes c/w pericarditis, no CP.  Echo wnl. Plan f/u w cards for OP stress test. esrd on HD MWF - Jan '18  Syncopal episodes, profound anemia > had GI bleed/ melena, EGD showed gastic ulcers rx'd with PPI. Transfused.  CP w normal Echo. ESRD on HD.       ROS  denies CP  no joint pain   no HA  no blurry vision  no rash  no diarrhea  no nausea/ vomiting  no dysuria  no difficulty voiding  no change in urine color    Past Medical History  Past Medical History:  Diagnosis Date  . Anemia   . Bruises easily   . Dialysis patient ([place])   . ESRD on dialysis ([place])   . Hyperlipidemia   . Hypertension   . Renal disorder    rt renal mass / < functioning of left kidney - being prepared for possible dialysis  . Right renal mass    Past Surgical History  Past Surgical History:  Procedure Laterality Date  . AV FISTULA PLACEMENT Right 07/11/2014   Procedure: ARTERIOVENOUS (AV) FISTULA CREATION RIGHT ARM BRACHIO-CEPHALIC WITH ATTEMPTED RADIO-CEPHALIC (AV) FISTULA;  Surgeon: Mal Misty, MD;  Location: [place];  Service: Vascular;  Laterality: Right;  . COLONOSCOPY WITH PROPOFOL N/A 09/04/2016   Procedure: COLONOSCOPY WITH PROPOFOL;  Surgeon: Carol Ada, MD;  Location: [facility];  Service: Endoscopy;  Laterality: N/A;  . ECTOPIC PREGNANCY SURGERY  1987  . ESOPHAGOGASTRODUODENOSCOPY N/A 07/30/2016   Procedure: ESOPHAGOGASTRODUODENOSCOPY (EGD);  Surgeon:  Carol Ada, MD;  Location[facility] ENDOSCOPY;  Service: Endoscopy;  Laterality: N/A;  Bedside  . ESOPHAGOGASTRODUODENOSCOPY (EGD) WITH PROPOFOL N/A 09/04/2016   Procedure: ESOPHAGOGASTRODUODENOSCOPY (EGD) WITH PROPOFOL;  Surgeon: Carol Ada, MD;  Location: [facility];  Service: Endoscopy;  Laterality: N/A;  . INSERTION OF DIALYSIS CATHETER Right 07/11/2014   Procedure: INSERTION OF DIALYSIS CATHETER IN RIGHT INTERNAL JUGULAR ;  Surgeon: Mal Misty, MD;  Location: [place];  Service: Vascular;  Laterality: Right;  . LAPAROSCOPIC NEPHRECTOMY Right 07/25/2014   Procedure: RIGHT LAPAROSCOPIC RADICAL NEPHRECTOMY ;  Surgeon: Ardis Hughs, MD;  Location: [facility];  Service: Urology;  Laterality: Right;  . PATCH ANGIOPLASTY Right 07/11/2014   Procedure: PATCH ANGIOPLASTY OF RIGHT RADIAL ARTERY  USING CEPHALIC VEIN.;  Surgeon: Mal Misty, MD;  Location: [place];  Service: Vascular;  Laterality: Right;  . THROMBECTOMY W/ EMBOLECTOMY Right 07/11/2014   Procedure: THROMBECTOMY OF RIGHT RADIAL ARTERY  ;  Surgeon: Mal Misty, MD;  Location: [facility] OR;  Service: Vascular;  Laterality: Right;   Family History  Family History  Problem Relation Age of Onset  . Throat cancer Mother        smoked  . Hypertension Father    Social History  reports that she has never smoked. She has never used smokeless tobacco. She reports that she drinks alcohol. She reports that she does not use drugs. Allergies No Known Allergies Home medications Prior to Admission medications   Medication Sig Start Date End Date Taking? Authorizing Provider  amLODipine (NORVASC) 10 MG tablet Take 10 mg by mouth daily.    Yes [provider]  calcitRIOL (ROCALTROL) 0.5 MCG capsule Take 0.5 mcg by mouth every Monday, Wednesday, and Friday.   Yes [provider]  calcium acetate (PHOSLO) 667 MG capsule Take 667-2,001 mg by mouth See admin instructions. Take 3 capsules (2001 mg) by mouth three times daily with meals and take 1 capsule (667 mg) twice daily with snacks   Yes [provider]  carvedilol (COREG) 12.5 MG tablet Take 12.5 mg by mouth 2 (two) times daily. 11/23/16  Yes [provider]  cloNIDine (CATAPRES) 0.1 MG tablet Take 0.1 mg by mouth 3 (three) times daily as needed (SBP >180).  12/16/16  Yes [provider]  HYDROcodone-acetaminophen (NORCO/VICODIN) 5-325 MG tablet Take 1 tablet by mouth every 6 (six) hours as needed (pain).  10/29/16  Yes [provider]  levothyroxine (SYNTHROID, LEVOTHROID) 88 MCG tablet Take 88 mcg by mouth daily. 10/29/16  Yes [provider]  [place] Calcium 500 MG TABS Take 500 mg by mouth 3 (three) times daily. 10/29/16  Yes [provider]  pantoprazole (PROTONIX) 40 MG tablet Take 1 tablet (40 mg total) by mouth 2  (two) times daily. 08/02/16  Yes Bonnielee Haff, MD  acetaminophen (TYLENOL) 500 MG tablet Take 1,000 mg by mouth every 6 (six) hours as needed for moderate pain or headache.    [provider]  losartan (COZAAR) 100 MG tablet Take 100 mg by mouth at bedtime.  07/11/16   [provider]  multivitamin (RENA-VIT) TABS tablet Take 1 tablet by mouth daily.    [provider]  SENSIPAR 60 MG tablet Take 60 mg by mouth at bedtime.  12/17/14   [provider]  sodium bicarbonate 650 MG tablet Take 650 mg by mouth 2 (two) times daily.    [provider]   Liver Function Tests  Recent Labs Lab 12/19/16 [phone]  AST 82*  ALT 21  ALKPHOS 43  BILITOT 0.9  PROT 6.4*  ALBUMIN 3.6   No results for input(s): LIPASE, AMYLASE in the last 168 hours. CBC  Recent Labs Lab 12/19/16 1648 12/19/16 1657 12/19/16 1851  WBC 9.1  --  4.1  NEUTROABS 6.6  --  PENDING  HGB 18.7* 20.4* 8.3*  HCT 58.6* 60.0* 29.7*  MCV 104.3*  --  113.4*  PLT 239  --  PENDING   Basic Metabolic Panel  Recent Labs Lab 12/19/16 1648 12/19/16 1657  NA 141 135  K 6.0* 8.2*  CL 103 112*  CO2 13*  --   GLUCOSE 101* 106*  BUN 95* 134*  CREATININE 13.97* 12.90*  CALCIUM 8.5*  --    Iron/TIBC/Ferritin/ %Sat No results found for: IRON, TIBC, FERRITIN, IRONPCTSAT  Vitals:   12/19/16 2048 12/19/16 2050 12/19/16 2055 12/19/16 2115  BP:  135/81 126/74 113/68  Pulse:  92 90 82  Resp:  15 13 13   Temp: 98.2 F (36.8 C)     TempSrc:      SpO2:  100% 100% 100%   Exam Gen alert, L facial droop No rash, cyanosis or gangrene Sclera anicteric, throat clear  No jvd or bruits Chest clear bilat RRR 2/6 holosyst M, no RG Abd soft ntnd no mass or ascites +bs, RUQ PD cath in plac GU defer MS no joint effusions or deformity Ext no LE or UE edema / no wounds or ulcers Neuro is alert, Ox 3, dense L hemiparesis and L facial droop   Home meds > norvasc,rocaltrol, phoslo, coreg,  sensipar, catapres, T4, cozaar, sod bicarb, PPI, MVI   Dialysis: PD 5 exchanges overnight, no day bag, no pause, 2000 volume, using all 1.5%    Assess: 1  Acute R frontal brain hemorrhagic stroke w left hemiparesis - per CCM/ nsurg 2  HTN'sive emergency w PRES on MRI - bp 198/117 at highest in ED; on IV cardene here, on 4 bp meds at home.   3  ESRD on CCPD - no gross vol excess 4  Hist renal cell cancer, sp R nephrectomy 5  Anemia of CKD - labs conflicting 6  Volume no vol excess 7  Hyperkalemia - 6.0 in ED, sp IV Ca/ insulin and dextrose.  Repeat pending.     Plan - CCPD , will set up tonight if possible, needs to be done in a room though.  If not able to start tonight, will start in am tomorrow. Use all 2.5% , get vol down some.  IV cardene for BP control, don't overcontrol (SBP 145 - 165 goal).  Repeat K pending - tele tracing is not at all worrisome for severe ^K+ , doubt accuracy of the iSTAT K in ED.    [name] [employer] pager [phone]   12/19/2016, 9:28 PM

## 2016-12-19 NOTE — ED Notes (Signed)
Pt in MRI with RN with full tele monitoring and cardene on MRI compatible pump

## 2016-12-20 ENCOUNTER — Inpatient Hospital Stay (HOSPITAL_COMMUNITY): Payer: BLUE CROSS/BLUE SHIELD

## 2016-12-20 DIAGNOSIS — I629 Nontraumatic intracranial hemorrhage, unspecified: Secondary | ICD-10-CM

## 2016-12-20 DIAGNOSIS — I609 Nontraumatic subarachnoid hemorrhage, unspecified: Secondary | ICD-10-CM

## 2016-12-20 LAB — BASIC METABOLIC PANEL
Anion gap: 19 — ABNORMAL HIGH (ref 5–15)
Anion gap: 20 — ABNORMAL HIGH (ref 5–15)
BUN: 100 mg/dL — ABNORMAL HIGH (ref 6–20)
BUN: 98 mg/dL — ABNORMAL HIGH (ref 6–20)
CALCIUM: 8.8 mg/dL — AB (ref 8.9–10.3)
CHLORIDE: 104 mmol/L (ref 101–111)
CO2: 15 mmol/L — ABNORMAL LOW (ref 22–32)
CO2: 15 mmol/L — ABNORMAL LOW (ref 22–32)
CREATININE: 13.54 mg/dL — AB (ref 0.44–1.00)
CREATININE: 13.35 mg/dL — AB (ref 0.44–1.00)
Calcium: 8.7 mg/dL — ABNORMAL LOW (ref 8.9–10.3)
Chloride: 103 mmol/L (ref 101–111)
GFR calc Af Amer: 3 mL/min — ABNORMAL LOW (ref >60)
GFR calc non Af Amer: 3 mL/min — ABNORMAL LOW (ref >60)
GFR calc non Af Amer: 3 mL/min — ABNORMAL LOW (ref >60)
GFR, EST AFRICAN AMERICAN: 3 mL/min — AB (ref >60)
GLUCOSE: 116 mg/dL — AB (ref 65–99)
Glucose, Bld: 111 mg/dL — ABNORMAL HIGH (ref 65–99)
Potassium: 5.1 mmol/L (ref 3.5–5.1)
Potassium: 5.2 mmol/L — ABNORMAL HIGH (ref 3.5–5.1)
SODIUM: 138 mmol/L (ref 135–145)
SODIUM: 138 mmol/L (ref 135–145)

## 2016-12-20 LAB — CBC WITH DIFFERENTIAL/PLATELET
BASOS PCT: 0 %
Basophils Absolute: 0 10*3/uL (ref 0.0–0.1)
Eosinophils Absolute: 0.1 10*3/uL (ref 0.0–0.7)
Eosinophils Relative: 1 %
HEMATOCRIT: 56.9 % — AB (ref 36.0–46.0)
Hemoglobin: 17.9 g/dL — ABNORMAL HIGH (ref 12.0–15.0)
Lymphocytes Relative: 9 %
Lymphs Abs: 1 10*3/uL (ref 0.7–4.0)
MCH: 32.4 pg (ref 26.0–34.0)
MCHC: 31.5 g/dL (ref 30.0–36.0)
MCV: 102.9 fL — ABNORMAL HIGH (ref 78.0–100.0)
MONO ABS: 0.7 10*3/uL (ref 0.1–1.0)
MONOS PCT: 7 %
NEUTROS ABS: 9 10*3/uL — AB (ref 1.7–7.7)
Neutrophils Relative %: 83 %
Platelets: 236 10*3/uL (ref 150–400)
RBC: 5.53 MIL/uL — ABNORMAL HIGH (ref 3.87–5.11)
RDW: 15 % (ref 11.5–15.5)
WBC: 10.8 10*3/uL — ABNORMAL HIGH (ref 4.0–10.5)

## 2016-12-20 LAB — BLOOD GAS, ARTERIAL
ACID-BASE DEFICIT: 8.1 mmol/L — AB (ref 0.0–2.0)
BICARBONATE: 17.1 mmol/L — AB (ref 20.0–28.0)
DRAWN BY: 365271
FIO2: 21
O2 SAT: 95.4 %
PCO2 ART: 36 mmHg (ref 32.0–48.0)
Patient temperature: 98.6
pH, Arterial: 7.299 — ABNORMAL LOW (ref 7.350–7.450)
pO2, Arterial: 89.9 mmHg (ref 83.0–108.0)

## 2016-12-20 LAB — CBC
HCT: 55.7 % — ABNORMAL HIGH (ref 36.0–46.0)
HEMOGLOBIN: 17.7 g/dL — AB (ref 12.0–15.0)
MCH: 32.4 pg (ref 26.0–34.0)
MCHC: 31.8 g/dL (ref 30.0–36.0)
MCV: 102 fL — ABNORMAL HIGH (ref 78.0–100.0)
Platelets: 208 10*3/uL (ref 150–400)
RBC: 5.46 MIL/uL — ABNORMAL HIGH (ref 3.87–5.11)
RDW: 15.2 % (ref 11.5–15.5)
WBC: 9.1 10*3/uL (ref 4.0–10.5)

## 2016-12-20 LAB — MRSA PCR SCREENING: MRSA by PCR: NEGATIVE

## 2016-12-20 LAB — MAGNESIUM: MAGNESIUM: 2.5 mg/dL — AB (ref 1.7–2.4)

## 2016-12-20 LAB — VITAMIN B12: Vitamin B-12: 1095 pg/mL — ABNORMAL HIGH (ref 180–914)

## 2016-12-20 LAB — HIV ANTIBODY (ROUTINE TESTING W REFLEX): HIV SCREEN 4TH GENERATION: NONREACTIVE

## 2016-12-20 LAB — PHOSPHORUS: Phosphorus: 7.4 mg/dL — ABNORMAL HIGH (ref 2.5–4.6)

## 2016-12-20 LAB — TSH: TSH: 15.736 u[IU]/mL — AB (ref 0.350–4.500)

## 2016-12-20 MED ORDER — NICARDIPINE HCL IN NACL 20-0.86 MG/200ML-% IV SOLN
INTRAVENOUS | Status: AC
  Filled 2016-12-20: qty 200

## 2016-12-20 MED ORDER — NICARDIPINE HCL IN NACL 20-0.86 MG/200ML-% IV SOLN: 3.0000 mg/h | INTRAVENOUS | Status: DC

## 2016-12-20 MED ORDER — AMLODIPINE BESYLATE 2.5 MG PO TABS
2.5000 mg | ORAL_TABLET | Freq: Two times a day (BID) | ORAL | Status: DC
Start: 2016-12-20 — End: 2016-12-21
  Administered 2016-12-20: 2.5 mg via ORAL
  Filled 2016-12-20: qty 1

## 2016-12-20 MED ORDER — HEPARIN 1000 UNIT/ML FOR PERITONEAL DIALYSIS
INTRAPERITONEAL | Status: DC | PRN
  Filled 2016-12-20: qty 5000

## 2016-12-20 MED ORDER — HYDRALAZINE HCL 20 MG/ML IJ SOLN
10.0000 mg | INTRAMUSCULAR | Status: DC | PRN
  Administered 2016-12-21 (×2): 10 mg via INTRAVENOUS
  Filled 2016-12-20 (×3): qty 1

## 2016-12-20 MED ORDER — CLONIDINE HCL 0.1 MG/24HR TD PTWK
0.1000 mg | MEDICATED_PATCH | TRANSDERMAL | Status: DC
  Administered 2016-12-20: 0.1 mg via TRANSDERMAL
  Filled 2016-12-20: qty 1

## 2016-12-20 MED ORDER — CARVEDILOL 3.125 MG PO TABS
6.2500 mg | ORAL_TABLET | Freq: Two times a day (BID) | ORAL | Status: DC
  Administered 2016-12-20: 6.25 mg via ORAL
  Filled 2016-12-20: qty 2

## 2016-12-20 MED ORDER — FAMOTIDINE IN NACL 20-0.9 MG/50ML-% IV SOLN: 20.0000 mg | INTRAVENOUS | Status: DC

## 2016-12-20 MED ORDER — NICARDIPINE HCL IN NACL 20-0.86 MG/200ML-% IV SOLN
3.0000 mg/h | INTRAVENOUS | Status: DC
  Administered 2016-12-20 (×3): 10 mg/h via INTRAVENOUS
  Filled 2016-12-20 (×3): qty 200

## 2016-12-20 NOTE — Evaluation (Signed)
Clinical/Bedside Swallow Evaluation Patient Details  Name: Terry Juarez MRN: [id number] Date of Birth: 03/26/1966  Today's Date: 12/20/2016 Time: SLP Start Time (ACUTE ONLY): 0831 SLP Stop Time (ACUTE ONLY): 0849 SLP Time Calculation (min) (ACUTE ONLY): 18 min  Past Medical History:  Past Medical History:  Diagnosis Date  . Anemia   . Bruises easily   . Dialysis patient ([place])   . ESRD on dialysis ([place])   . Hyperlipidemia   . Hypertension   . Renal disorder    rt renal mass / < functioning of left kidney - being prepared for possible dialysis  . Right renal mass    Past Surgical History:  Past Surgical History:  Procedure Laterality Date  . AV FISTULA PLACEMENT Right 07/11/2014   Procedure: ARTERIOVENOUS (AV) FISTULA CREATION RIGHT ARM BRACHIO-CEPHALIC WITH ATTEMPTED RADIO-CEPHALIC (AV) FISTULA;  Surgeon: Mal Misty, MD;  Location: [place];  Service: Vascular;  Laterality: Right;  . COLONOSCOPY WITH PROPOFOL N/A 09/04/2016   Procedure: COLONOSCOPY WITH PROPOFOL;  Surgeon: Carol Ada, MD;  [facility];  Service: Endoscopy;  Laterality: N/A;  . ECTOPIC PREGNANCY SURGERY  1987  . ESOPHAGOGASTRODUODENOSCOPY N/A 07/30/2016   Procedure: ESOPHAGOGASTRODUODENOSCOPY (EGD);  Surgeon: Carol Ada, MD;  Location: [facility] ENDOSCOPY;  Service: Endoscopy;  Laterality: N/A;  Bedside  . ESOPHAGOGASTRODUODENOSCOPY (EGD) WITH PROPOFOL N/A 09/04/2016   Procedure: ESOPHAGOGASTRODUODENOSCOPY (EGD) WITH PROPOFOL;  Surgeon: Carol Ada, MD;  [facility];  Service: Endoscopy;  Laterality: N/A;  . INSERTION OF DIALYSIS CATHETER Right 07/11/2014   Procedure: INSERTION OF DIALYSIS CATHETER IN RIGHT INTERNAL JUGULAR ;  Surgeon: Mal Misty, MD;  Location: [place];  Service: Vascular;  Laterality: Right;  . LAPAROSCOPIC NEPHRECTOMY Right 07/25/2014   Procedure: RIGHT LAPAROSCOPIC RADICAL NEPHRECTOMY ;  Surgeon: Ardis Hughs, MD;  [facility];  Service: Urology;  Laterality: Right;  .  PATCH ANGIOPLASTY Right 07/11/2014   Procedure: PATCH ANGIOPLASTY OF RIGHT RADIAL ARTERY USING CEPHALIC VEIN.;  Surgeon: Mal Misty, MD;  Location: [place];  Service: Vascular;  Laterality: Right;  . THROMBECTOMY W/ EMBOLECTOMY Right 07/11/2014   Procedure: THROMBECTOMY OF RIGHT RADIAL ARTERY  ;  Surgeon: Mal Misty, MD;  Location: [facility] OR;  Service: Vascular;  Laterality: Right;   HPI:  51 yo F with history of ESRD on Peritoneal Dialysis, HTN, Right renal mass, and Anemia, who was found down on the floor seizing. She was found to have a large right frontal hemorrhage with surrounding edema and mass effect, also with small SAH and signs of PRES. Pt describes subtle dysphagia and vocal quality changes following thyroid surgery in April.   Assessment / Plan / Recommendation Clinical Impression  Pt has audible swallows and throat clearing with all consistencies tested, but with multiple subswallows per thin liquid bolus. She reports that these signs of dysphagia have been present since her thyroid surgery in April. She could not complete the 3 ounce water test due to inability to drink consecutively and without delayed coughing. Given her L sided weakness, weak cough, and inability to complete the 3 ounce water screen, recommend proceeding with MBS this morning to better assess oropharyngeal dysphagia. SLP Visit Diagnosis: Dysphagia, unspecified (R13.10)    Aspiration Risk  Moderate aspiration risk    Diet Recommendation Ice chips PRN after oral care;Free water protocol after oral care   Medication Administration: Crushed with puree    Other  Recommendations Oral Care Recommendations: Oral care prior to ice chip/H20   Follow up  Recommendations  (tba)      Frequency and Duration            Prognosis Prognosis for Safe Diet Advancement: Good      Swallow Study   General HPI: 52 yo F with history of ESRD on Peritoneal Dialysis, HTN, Right renal mass, and Anemia, who was found down on the  floor seizing. She was found to have a large right frontal hemorrhage with surrounding edema and mass effect, also with small SAH and signs of PRES. Pt describes subtle dysphagia and vocal quality changes following thyroid surgery in April. Type of Study: Bedside Swallow Evaluation Previous Swallow Assessment: none in chart Diet Prior to this Study: NPO Temperature Spikes Noted: No Respiratory Status: Room air History of Recent Intubation: No Behavior/Cognition: Alert;Cooperative;Pleasant mood Oral Cavity Assessment: Within Functional Limits Oral Care Completed by SLP: No Oral Cavity - Dentition: Adequate natural dentition Vision: Functional for self-feeding Self-Feeding Abilities: Able to feed self;Needs assist Patient Positioning: Upright in bed Baseline Vocal Quality: Hoarse (mild, pt reports baseline since thyroid surgery) Volitional Cough: Weak Volitional Swallow: Able to elicit    Oral/Motor/Sensory Function Overall Oral Motor/Sensory Function:  (generalized weakness/reduced ROM, L > R)   Ice Chips Ice chips: Impaired Presentation: Spoon Pharyngeal Phase Impairments: Multiple swallows;Other (comments) (audible swallows)   Thin Liquid Thin Liquid: Impaired Presentation: Cup;Self Fed;Straw Pharyngeal  Phase Impairments: Multiple swallows;Throat Clearing - Immediate;Other (comments) (audible swallows)    Nectar Thick Nectar Thick Liquid: Not tested   Honey Thick Honey Thick Liquid: Not tested   Puree Puree: Impaired Presentation: Self Fed;Spoon Pharyngeal Phase Impairments: Other (comments);Throat Clearing - Delayed (audible swallows)   Solid   GO   Solid: Not tested        Germain Osgood 12/20/2016,9:07 AM   Germain Osgood, M.A. CCC-SLP [phone]

## 2016-12-20 NOTE — Progress Notes (Addendum)
STROKE TEAM PROGRESS NOTE   HISTORY OF PRESENT ILLNESS (per record) Terry Juarez is an 51 y.o. female periotoneal dialysis patient with HTN, HLD and anemia who presented with new onset seizure, acute left sided weakness and right frontal headache. At baseline she is cognitively intact and handles her own peritoneal dialysis.  A person who rents lodging from the patient found her on the floor today seizing and with left sided weakness. LKN is 11 AM today. EMS administered 5 mg Versed, which stopped the seizure. On arrival to the ED she was loaded with Keppra 1000 mg IV. No further seizures have occurred.    A STAT CT head was obtained, revealing a large right frontal lobe hemorrhage. CT head detailed findings: 1. Right posterior frontal lobe acute hemorrhage measuring up to 29 mm with small area of surrounding edema and local mass effect. No significant midline shift or herniation. 2. Small volume of subarachnoid hemorrhage overlying the right frontal convexity. 3. Hypoattenuation within subcortical white matter and bilateral parietal and occipital lobes. Findings are suggestive of PRES in the setting of hypertension.   Ca gluconate, dextrose and insulin given for hyperkalemia. Also started on nicardipine drip for HTN.     SUBJECTIVE (INTERVAL HISTORY)  The patient reports that she's feeling better although she still has headaches and neck pain. The headache and neck pain seems worse on the left side. She tells that she has a long-standing history of hypertension. She was diagnosed at the age of 51 years old. She tells that her blood pressure running in the 237S systolic and 28B diastolic. She reports being compliant with medications and other treatment.   OBJECTIVE Temp:  [97.8 F (36.6 C)-98.2 F (36.8 C)] 98.1 F (36.7 C) (06/17 0400) Pulse Rate:  [72-105] 84 (06/17 0900) Resp:  [10-33] 13 (06/17 0900) BP: (108-198)/(60-117) 120/69 (06/17 0900) SpO2:  [95 %-100 %] 98 % (06/17  0900) Weight:  [54.8 kg (120 lb 13 oz)] 54.8 kg (120 lb 13 oz) (06/17 0150)  CBC:   Recent Labs Lab 12/19/16 1851 12/20/16 0140 12/20/16 0847  WBC QUESTIONABLE RESULTS, RECOMMEND RECOLLECT TO VERIFY 10.8* 9.1  NEUTROABS 3.6 9.0*  --   HGB QUESTIONABLE RESULTS, RECOMMEND RECOLLECT TO VERIFY 17.9* 17.7*  HCT 29.7* 56.9* 55.7*  MCV 113.4* 102.9* 102.0*  PLT QUESTIONABLE RESULTS, RECOMMEND RECOLLECT TO VERIFY 236 151    Basic Metabolic Panel:   Recent Labs Lab 12/19/16 2344 12/20/16 0014  NA 138 138  K 5.1 5.2*  CL 104 103  CO2 15* 15*  GLUCOSE 116* 111*  BUN 98* 100*  CREATININE 13.54* 13.35*  CALCIUM 8.7* 8.8*  MG  --  2.5*  PHOS  --  7.4*    Lipid Panel:     Component Value Date/Time   CHOL 214 (H) 02/12/2015 0511   TRIG 42 02/12/2015 0511   HDL 83 02/12/2015 0511   CHOLHDL 2.6 02/12/2015 0511   VLDL 8 02/12/2015 0511   LDLCALC 123 (H) 02/12/2015 0511   HgbA1c: No results found for: HGBA1C Urine Drug Screen:     Component Value Date/Time   LABOPIA NONE DETECTED 12/19/2016 2051   COCAINSCRNUR NONE DETECTED 12/19/2016 2051   LABBENZ POSITIVE (A) 12/19/2016 2051   AMPHETMU NONE DETECTED 12/19/2016 2051   THCU NONE DETECTED 12/19/2016 2051   LABBARB NONE DETECTED 12/19/2016 2051    Alcohol Level No results found for: ETH  IMAGING  Ct Angio Head W Or Wo Contrast 12/19/2016 1. No vascular explanation for the  right frontal parietal hematoma. Dural venous sinuses are patent and there is no evidence of vascular malformation.  2. The 16 cc hematoma is mildly increased from earlier CT. Negative for shift or herniation.  3. Fibromuscular dysplasia of the bilateral cervical ICA.  4. Moderate atherosclerosis for age. Negative for flow limiting stenosis.   Ct Head Wo Contrast 12/20/2016 1. Slightly increased size of right frontal intraparenchymal hematoma with unchanged right convexity subarachnoid blood, compared to head CTA performed 12/19/2016. Compared to the  initial head CT from this presentation, obtained at 5:02 p.m. on 12/19/2016, the hematoma has increased in size.  2. No new hemorrhage, hydrocephalus or mass effect.    Ct Head Wo Contrast 12/19/2016 1. Right posterior frontal lobe acute hemorrhage measuring up to 29 mm with small area of surrounding edema and local mass effect. No significant midline shift or herniation. Follow-up to ensure resolution is recommended to exclude an underlying mass.  2. Small volume of subarachnoid hemorrhage overlying the right frontal convexity.  3. Hypoattenuation within subcortical white matter and bilateral parietal and occipital lobes. Findings are suggestive of PRES in the setting of hypertension. This can be further characterized with MRI of the brain.    Ct Angio Neck W And/or Wo Contrast 12/19/2016 1. No vascular explanation for the right frontal parietal hematoma. Dural venous sinuses are patent and there is no evidence of vascular malformation.  2. The 16 cc hematoma is mildly increased from earlier CT. Negative for shift or herniation.  3. Fibromuscular dysplasia of the bilateral cervical ICA.  4. Moderate atherosclerosis for age. Negative for flow limiting stenosis.     Ct Cervical Spine Wo Contrast 12/19/2016 No acute fracture or dislocation of the cervical spine.     Mr Brain Wo Contrast 12/19/2016 1. Findings of posterior reversible encephalopathy syndrome.  2. Size stable posterior right frontal hematoma compared to CTA 1 hour prior.    Dg Swallowing Func-speech Pathology 12/20/2016 SLP Diet Recommendations Regular solids;Thin liquid Liquid Administration via Cup;Straw Medication Administration Whole meds with liquid.           PHYSICAL EXAM  GENERAL: She appears to be in some discomfort from headaches.  HEENT: Supple; she appears to have proptosis bilaterally and mild ptosis on the right  ABDOMEN: soft  EXTREMITIES: No edema   BACK: Unremarkable.  SKIN: Normal by  inspection.    MENTAL STATUS: Alert and oriented. Speech, language and cognition are generally intact. Judgment and insight normal.   CRANIAL NERVES: Pupils are equal, round and reactive to light and accomodation; extra ocular movements shows limited eye movements to the left to about 70% volitionally but passively can go almost completely all the way, there is mild sustained nystagmus with movement to the right; visual fields are full; there is mild ptosis on the right, there is moderate weakness of the left facial muscles both uppers and lowers including the frontalis, tongue is midline.  MOTOR: Left upper extremity and left lower extremity plegic 0/5. The right upper and lower extremity shows normal tone, bulk and strength.  COORDINATION: No tremors or dysmetria on the right side    The patient's brain imaging scans are reviewed. The head CT scan shows a moderate sized relatively superficial acute hematoma measuring about 40 cc with mild mass effect and no midline shift. Repeat scan is unchanged. The MRI findings suggest similar size hemorrhage with a mixture of bright and low signal seen on DWI and FLAIR imaging. FLAIR imaging all shows shows increased signal involving  the deep white matter area mostly limited to the subcortical regions both posteriorly and in the frontal region and lesser around the periventricular region. There is more signal seen posteriorly. T1 shows mixture of isodense and hypodense changes involving the same area. SWI shows mixture of low and high signal.    ASSESSMENT/PLAN Terry Juarez is a 51 y.o. female with history of a renal cell carcinoma, hypertension, end-stage renal disease on peritoneal dialysis, hyperlipidemia, and anemia presenting with seizure, left-sided weakness, and right frontal headache. She did not receive IV t-PA due to [place]. This is a hypertensive hemorrhage likely. EEG will be obtained and continue with Keppra. Salt restriction discussed with  the patient. We will check   thyroid function tests.   Hemorrhagic stroke: Large right frontal lobe hemorrhage, small volume of SAH, and possible PRES.   Resultant left hemiplegia and headache  CT head -  Right posterior frontal lobe acute hemorrhage measuring up to 29 mm with small area of surrounding edema and local mass effect.  MRI head - Findings of posterior reversible encephalopathy syndrome.  CTA - No vascular explanation for the right frontal parietal hematoma. Fibromuscular dysplasia of the bilateral cervical ICA.   MRA head - see CTA  Carotid Doppler - CTA Neck  2D Echo - 07/30/2016 - EF 60-65%. No cardiac source of emboli identified.  LDL - not indicated  HgbA1c - not indicated  VTE prophylaxis - SCDs Diet renal with fluid restriction Fluid restriction: 1200 mL Fluid; Room service appropriate? Yes; Fluid consistency: Thin  No antithrombotic prior to admission, now on No antithrombotic due to hemorrhage  Ongoing aggressive stroke risk factor management  Therapy recommendations:  pending  Disposition: Pending  Hypertension  Stable - Coreg, Catapres, Cardene  BP control important given the patient's hypertensive hemorrhage. Keep less than 180/100.  Long-term BP goal normotensive - 135/85  Hyperlipidemia  Home meds: No lipid lowering medications prior to admission   Other Stroke Risk Factors    Other Active Problems  Seizures -> Keppra -EEG  Risk for vasospasm - discussed with Dr Merlene Laughter -> Nimodipine probably not indicated at this time.  Hyperkalemia - nephrology on board.  Hospital day # 1  Critical care patient requiring extensive discussion with family and other consultants. 30 minutes of critical care time was spent with this patient.  To contact Stroke Continuity provider, please refer to [email or web address] After hours, contact General Neurology

## 2016-12-20 NOTE — Progress Notes (Addendum)
Called dialysis x2, no answer will re-try

## 2016-12-20 NOTE — Progress Notes (Signed)
  [facility] Progress Note   Subjective: off of cardene , on clon patch and po coreg  Vitals:   12/20/16 0900 12/20/16 1000 12/20/16 1100 12/20/16 1200  BP: 120/69 123/62 113/65 (!) 107/56  Pulse: 84 88 76 64  Resp: 13 11 13 13   Temp:    98.3 F (36.8 C)  TempSrc:      SpO2: 98% 100% 93% 97%  Weight:      Height:        Inpatient medications: . carvedilol  6.25 mg Oral BID WC  . cloNIDine  0.1 mg Transdermal Weekly  . gentamicin cream  1 application Topical Daily   . sodium chloride    . dialysis solution 2.5% low-MG/low-CA    . [START ON 12/21/2016] famotidine (PEPCID) IV    . levETIRAcetam Stopped (12/20/16 0539)  . niCARDipine 5 mg/hr (12/20/16 1132)  . niCARdipine in saline    . niCARdipine in saline     sodium chloride, dianeal solution for CAPD/CCPD with heparin, HYDROmorphone (DILAUDID) injection  Exam: Gen alert, L facial droop No rash, cyanosis or gangrene Sclera anicteric, throat clear  No jvd or bruits Chest clear bilat RRR 2/6 holosyst M, no RG Abd soft ntnd no mass or ascites +bs, RUQ PD cath in plac GU defer MS no joint effusions or deformity Ext no LE or UE edema / no wounds or ulcers Neuro is alert, Ox 3, dense L hemiparesis and L facial droop   Home meds > norvasc,rocaltrol, phoslo, coreg, sensipar, catapres, T4, cozaar, sod bicarb, PPI, MVI   Dialysis: PD 5 exchanges overnight, no day bag, no pause, 2000 volume, using all 1.5%    Assess: 1  Acute R hemorrhagic stroke w left hemiparesis - per CCM/ nsurg 2  HTN'sive emergency w PRES on MRI - BP's are too low now, should keep SBP 140 - 165 range for 1-2 more days.  Have d/w RN and modified BP medications.  3  ESRD on CCPD - no gross vol excess 4  Hist renal cell cancer, sp R nephrectomy 5  Anemia of CKD - labs conflicting 6  Volume no vol excess 7  Hyperkalemia - better, treat w PD today  Plan - as above   [name] [facility] pager  [phone]   12/20/2016, 12:56 PM    Recent Labs Lab 12/19/16 2101 12/19/16 2344 12/20/16 0014  NA 138 138 138  K 4.3 5.1 5.2*  CL 103 104 103  CO2 18* 15* 15*  GLUCOSE 98 116* 111*  BUN 90* 98* 100*  CREATININE 13.31* 13.54* 13.35*  CALCIUM 8.5* 8.7* 8.8*  PHOS  --   --  7.4*    Recent Labs Lab 12/19/16 1648  AST 82*  ALT 21  ALKPHOS 43  BILITOT 0.9  PROT 6.4*  ALBUMIN 3.6    Recent Labs Lab 12/19/16 1648  12/19/16 1851 12/20/16 0140 12/20/16 0847  WBC 9.1  --  QUESTIONABLE RESULTS, RECOMMEND RECOLLECT TO VERIFY 10.8* 9.1  NEUTROABS 6.6  --  3.6 9.0*  --   HGB 18.7*  < > QUESTIONABLE RESULTS, RECOMMEND RECOLLECT TO VERIFY 17.9* 17.7*  HCT 58.6*  < > 29.7* 56.9* 55.7*  MCV 104.3*  --  113.4* 102.9* 102.0*  PLT 239  --  QUESTIONABLE RESULTS, RECOMMEND RECOLLECT TO VERIFY 236 208  < > = values in this interval not displayed. Iron/TIBC/Ferritin/ %Sat No results found for: IRON, TIBC, FERRITIN, IRONPCTSAT

## 2016-12-20 NOTE — ED Notes (Signed)
Recollect cbc

## 2016-12-20 NOTE — Progress Notes (Signed)
PULMONARY / CRITICAL CARE MEDICINE   Name: Terry Juarez MRN: [id number] DOB: 1966/06/12    ADMISSION DATE:  12/19/2016 CONSULTATION DATE: Dr Ashok Cordia  CHIEF COMPLAINT: ICH  BRIEF:  51 y/o female with a PMH of ESRD who developed a subarachnoid hemorrhage on 6/16 and was admitted for the same.     SUBJECTIVE:   Feels headache this morning, tired, wants to sleep  VITAL SIGNS: BP 115/71   Pulse 78   Temp 98.1 F (36.7 C) (Oral)   Resp 13   Ht 5\' 5"  (1.651 m)   Wt 120 lb 13 oz (54.8 kg)   SpO2 98%   BMI 20.10 kg/m   HEMODYNAMICS:  2 PIV's (18G and 20G in LUE) RUE AV fistula   INTAKE / OUTPUT: I/O last 3 completed shifts: In: 1254.6 [I.V.:1254.6] Out: 175 [Urine:175]  PHYSICAL EXAMINATION: General: awake, alert HENT: NCAT OP clear PULM: CTA B CV: RRR, no mgr GI: BS+, soft, nontender MSK: normal bulk and tone Neuro: awake, alert, speech coherent  LABS:  BMET  Recent Labs Lab 12/19/16 2101 12/19/16 2344 12/20/16 0014  NA 138 138 138  K 4.3 5.1 5.2*  CL 103 104 103  CO2 18* 15* 15*  BUN 90* 98* 100*  CREATININE 13.31* 13.54* 13.35*  GLUCOSE 98 116* 111*   Electrolytes  Recent Labs Lab 12/19/16 2101 12/19/16 2344 12/20/16 0014  CALCIUM 8.5* 8.7* 8.8*  MG  --   --  2.5*  PHOS  --   --  7.4*   CBC  Recent Labs Lab 12/19/16 1648 12/19/16 1657 12/19/16 1851 12/20/16 0140  WBC 9.1  --  4.1 10.8*  HGB 18.7* 20.4* 8.3* 17.9*  HCT 58.6* 60.0* 29.7* 56.9*  PLT 239  --  109* 236   Coag's  Recent Labs Lab 12/19/16 1818 12/19/16 2020  INR 0.97 1.88   Sepsis Markers  Recent Labs Lab 12/19/16 1658 12/19/16 2106  LATICACIDVEN 5.86* 1.54   ABG  Recent Labs Lab 12/19/16 2135 12/20/16 0520  PHART 7.261* 7.299*  PCO2ART 41.4 36.0  PO2ART 133.0* 89.9    Liver Enzymes  Recent Labs Lab 12/19/16 1648  AST 82*  ALT 21  ALKPHOS 43  BILITOT 0.9  ALBUMIN 3.6   Cardiac Enzymes  Recent Labs Lab 12/19/16 2101  TROPONINI 1.21*     Glucose  Recent Labs Lab 12/19/16 1644  GLUCAP 119*    Imaging Ct Angio Head W Or Wo Contrast  Result Date: 12/19/2016 CLINICAL DATA:  Intraparenchymal and subarachnoid hemorrhage. EXAM: CT ANGIOGRAPHY HEAD AND NECK TECHNIQUE: Multidetector CT imaging of the head and neck was performed using the standard protocol during bolus administration of intravenous contrast. Multiplanar CT image reconstructions and MIPs were obtained to evaluate the vascular anatomy. Carotid stenosis measurements (when applicable) are obtained utilizing NASCET criteria, using the distal internal carotid diameter as the denominator. CONTRAST:  50 cc Isovue 370 intravenous COMPARISON:  Noncontrast head CT from earlier today FINDINGS: CTA NECK FINDINGS Aortic arch: Atheromatous wall thickening that is mild. No acute finding or aneurysm. Right carotid system: Mild atheromatous plaque at the common carotid bifurcation without stenosis or ulceration. Beaded ICA consistent with fibromuscular dysplasia. No superimposed acute dissection. Left carotid system: Scattered atheromatous plaque on the distal common carotid and proximal ICA, moderate for age. No flow limiting stenosis or ulceration. Beading of the left ICA consistent with fibromuscular dysplasia. No superimposed dissection. Vertebral arteries: Proximal subclavian atherosclerosis without flow limiting stenosis. Both vertebral arteries are smooth and widely  patent to the dura. Left vertebral dominance. Skeleton: No acute or aggressive finding. Other neck: Negative for mass or inflammation. Upper chest: Negative Review of the MIP images confirms the above findings CTA HEAD FINDINGS Anterior circulation: Moderate calcified plaque on the carotid siphons without flow limiting stenosis. Standard branching. There is no evidence of aneurysm or vascular malformation. No major branch occlusion or proximal flow limiting stenosis. Posterior circulation: Left dominant vertebral artery. The  vertebral and basilar arteries are smooth and widely patent. Aplastic right and mildly hypoplastic left P1 segments with large posterior communicating arteries. No branch occlusion, stenosis, aneurysm, or evidence of vascular malformation. Venous sinuses: Patent on the delayed phase. Anatomic variants: Fetal type PCA circulation, especially on the right. Delayed phase: No masslike enhancement. Pending brain MRI. The right frontal parietal hematoma is larger than before, measuring 27 x 34 x 35 mm as compared to 20 x 31 x 28 mm. Local subarachnoid hemorrhage and mild cerebral edema without shift or herniation. Review of the MIP images confirms the above findings IMPRESSION: 1. No vascular explanation for the right frontal parietal hematoma. Dural venous sinuses are patent and there is no evidence of vascular malformation. 2. The 16 cc hematoma is mildly increased from earlier CT. Negative for shift or herniation. 3. Fibromuscular dysplasia of the bilateral cervical ICA. 4. Moderate atherosclerosis for age. Negative for flow limiting stenosis. Electronically Signed   By: Monte Fantasia M.D.   On: 12/19/2016 19:37   Ct Head Wo Contrast  Result Date: 12/20/2016 CLINICAL DATA:  Intracranial hemorrhage follow up EXAM: CT HEAD WITHOUT CONTRAST TECHNIQUE: Contiguous axial images were obtained from the base of the skull through the vertex without intravenous contrast. COMPARISON:  Brain MRI 12/19/2016 Head CT 12/19/2016 Head CTA 12/19/2016 FINDINGS: Brain: Intraparenchymal hematoma centered in the right frontal lobe now measures 3.8 x 3.4 x 3.8 cm, previously 3.7 x 3.8 by 3.0 cm. Surrounding edema is unchanged. There is subarachnoid blood over the right convexity, also unchanged. There is no midline shift. No hydrocephalus. Basal cisterns remain patent. Vascular: No hyperdense vessel or unexpected calcification. Skull: Normal visualized skull base, calvarium and extracranial soft tissues. Sinuses/Orbits: No sinus fluid  levels or advanced mucosal thickening. No mastoid effusion. Normal orbits. IMPRESSION: 1. Slightly increased size of right frontal intraparenchymal hematoma with unchanged right convexity subarachnoid blood, compared to head CTA performed 12/19/2016. Compared to the initial head CT from this presentation, obtained at 5:02 p.m. on 12/19/2016, the hematoma has increased in size. 2. No new hemorrhage, hydrocephalus or mass effect. Electronically Signed   By: Ulyses Jarred M.D.   On: 12/20/2016 06:29   Ct Head Wo Contrast  Result Date: 12/19/2016 CLINICAL DATA:  51 y/o  F; seizure. EXAM: CT HEAD WITHOUT CONTRAST CT CERVICAL SPINE WITHOUT CONTRAST TECHNIQUE: Multidetector CT imaging of the head and cervical spine was performed following the standard protocol without intravenous contrast. Multiplanar CT image reconstructions of the cervical spine were also generated. COMPARISON:  02/11/2015 CT of the head. FINDINGS: CT HEAD FINDINGS Brain: Right posterior frontal lobe acute hemorrhage measuring 22 x 29 x 21 mm (AP x ML x CC series 3 image 26 and series 5, image 34). Small volume of subarachnoid hemorrhage with overlying the right frontal convexity and along the falx. There is a small surrounding region of hypoattenuation in the brain compatible with edema with local mass effect effacing sulci. No significant midline shift. No evidence for herniation. No large territory acute stroke identified. There are foci of hypoattenuation within  subcortical white matter in the bilateral parietal lobes and occipital lobes. The distribution is suggestive of PRES. Vascular: Extensive calcific atherosclerosis of the carotid siphons. Skull: Normal. Negative for fracture or focal lesion. Sinuses/Orbits: No acute finding. Other: None. CT CERVICAL SPINE FINDINGS Alignment: Normal. Skull base and vertebrae: No acute fracture. No primary bone lesion or focal pathologic process. Soft tissues and spinal canal: No prevertebral fluid or  swelling. No visible canal hematoma. Disc levels: Straightening of cervical lordosis and discogenic degenerative changes with mild disc space narrowing and marginal osteophytes from the C3 through C5 levels. Upper chest: Negative. Other: Negative appear IMPRESSION: 1. Right posterior frontal lobe acute hemorrhage measuring up to 29 mm with small area of surrounding edema and local mass effect. No significant midline shift or herniation. Follow-up to ensure resolution is recommended to exclude an underlying mass. 2. Small volume of subarachnoid hemorrhage overlying the right frontal convexity. 3. Hypoattenuation within subcortical white matter and bilateral parietal and occipital lobes. Findings are suggestive of PRES in the setting of hypertension. This can be further characterized with MRI of the brain. 4. No acute fracture or dislocation of the cervical spine. These results were called by telephone at the time of interpretation on 12/19/2016 at 5:12 pm to Dr. Lajean Saver , who verbally acknowledged these results. Electronically Signed   By: Kristine Garbe M.D.   On: 12/19/2016 17:23   Ct Angio Neck W And/or Wo Contrast  Result Date: 12/19/2016 CLINICAL DATA:  Intraparenchymal and subarachnoid hemorrhage. EXAM: CT ANGIOGRAPHY HEAD AND NECK TECHNIQUE: Multidetector CT imaging of the head and neck was performed using the standard protocol during bolus administration of intravenous contrast. Multiplanar CT image reconstructions and MIPs were obtained to evaluate the vascular anatomy. Carotid stenosis measurements (when applicable) are obtained utilizing NASCET criteria, using the distal internal carotid diameter as the denominator. CONTRAST:  50 cc Isovue 370 intravenous COMPARISON:  Noncontrast head CT from earlier today FINDINGS: CTA NECK FINDINGS Aortic arch: Atheromatous wall thickening that is mild. No acute finding or aneurysm. Right carotid system: Mild atheromatous plaque at the common carotid  bifurcation without stenosis or ulceration. Beaded ICA consistent with fibromuscular dysplasia. No superimposed acute dissection. Left carotid system: Scattered atheromatous plaque on the distal common carotid and proximal ICA, moderate for age. No flow limiting stenosis or ulceration. Beading of the left ICA consistent with fibromuscular dysplasia. No superimposed dissection. Vertebral arteries: Proximal subclavian atherosclerosis without flow limiting stenosis. Both vertebral arteries are smooth and widely patent to the dura. Left vertebral dominance. Skeleton: No acute or aggressive finding. Other neck: Negative for mass or inflammation. Upper chest: Negative Review of the MIP images confirms the above findings CTA HEAD FINDINGS Anterior circulation: Moderate calcified plaque on the carotid siphons without flow limiting stenosis. Standard branching. There is no evidence of aneurysm or vascular malformation. No major branch occlusion or proximal flow limiting stenosis. Posterior circulation: Left dominant vertebral artery. The vertebral and basilar arteries are smooth and widely patent. Aplastic right and mildly hypoplastic left P1 segments with large posterior communicating arteries. No branch occlusion, stenosis, aneurysm, or evidence of vascular malformation. Venous sinuses: Patent on the delayed phase. Anatomic variants: Fetal type PCA circulation, especially on the right. Delayed phase: No masslike enhancement. Pending brain MRI. The right frontal parietal hematoma is larger than before, measuring 27 x 34 x 35 mm as compared to 20 x 31 x 28 mm. Local subarachnoid hemorrhage and mild cerebral edema without shift or herniation. Review of the MIP images confirms  the above findings IMPRESSION: 1. No vascular explanation for the right frontal parietal hematoma. Dural venous sinuses are patent and there is no evidence of vascular malformation. 2. The 16 cc hematoma is mildly increased from earlier CT. Negative for  shift or herniation. 3. Fibromuscular dysplasia of the bilateral cervical ICA. 4. Moderate atherosclerosis for age. Negative for flow limiting stenosis. Electronically Signed   By: Monte Fantasia M.D.   On: 12/19/2016 19:37   Ct Cervical Spine Wo Contrast  Result Date: 12/19/2016 CLINICAL DATA:  51 y/o  F; seizure. EXAM: CT HEAD WITHOUT CONTRAST CT CERVICAL SPINE WITHOUT CONTRAST TECHNIQUE: Multidetector CT imaging of the head and cervical spine was performed following the standard protocol without intravenous contrast. Multiplanar CT image reconstructions of the cervical spine were also generated. COMPARISON:  02/11/2015 CT of the head. FINDINGS: CT HEAD FINDINGS Brain: Right posterior frontal lobe acute hemorrhage measuring 22 x 29 x 21 mm (AP x ML x CC series 3 image 26 and series 5, image 34). Small volume of subarachnoid hemorrhage with overlying the right frontal convexity and along the falx. There is a small surrounding region of hypoattenuation in the brain compatible with edema with local mass effect effacing sulci. No significant midline shift. No evidence for herniation. No large territory acute stroke identified. There are foci of hypoattenuation within subcortical white matter in the bilateral parietal lobes and occipital lobes. The distribution is suggestive of PRES. Vascular: Extensive calcific atherosclerosis of the carotid siphons. Skull: Normal. Negative for fracture or focal lesion. Sinuses/Orbits: No acute finding. Other: None. CT CERVICAL SPINE FINDINGS Alignment: Normal. Skull base and vertebrae: No acute fracture. No primary bone lesion or focal pathologic process. Soft tissues and spinal canal: No prevertebral fluid or swelling. No visible canal hematoma. Disc levels: Straightening of cervical lordosis and discogenic degenerative changes with mild disc space narrowing and marginal osteophytes from the C3 through C5 levels. Upper chest: Negative. Other: Negative appear IMPRESSION: 1.  Right posterior frontal lobe acute hemorrhage measuring up to 29 mm with small area of surrounding edema and local mass effect. No significant midline shift or herniation. Follow-up to ensure resolution is recommended to exclude an underlying mass. 2. Small volume of subarachnoid hemorrhage overlying the right frontal convexity. 3. Hypoattenuation within subcortical white matter and bilateral parietal and occipital lobes. Findings are suggestive of PRES in the setting of hypertension. This can be further characterized with MRI of the brain. 4. No acute fracture or dislocation of the cervical spine. These results were called by telephone at the time of interpretation on 12/19/2016 at 5:12 pm to Dr. Lajean Saver , who verbally acknowledged these results. Electronically Signed   By: Kristine Garbe M.D.   On: 12/19/2016 17:23   Mr Brain Wo Contrast  Result Date: 12/19/2016 CLINICAL DATA:  Seizure and altered mental status. EXAM: MRI HEAD WITHOUT CONTRAST TECHNIQUE: Multiplanar, multiecho pulse sequences of the brain and surrounding structures were obtained without intravenous contrast. COMPARISON:  CTA head neck from earlier the same day FINDINGS: Brain: Acute parenchymal hematoma in the posterior right frontal lobe is size stable from CT 60 minutes prior. There is subarachnoid FLAIR signal hyperintensity along the right more than left convexity. On the right there is associated susceptibility on gradient imaging correlating with subarachnoid hemorrhage by CT. There is bilateral cortical and subcortical T2 hyperintensity within the predominantly high and posterior cerebral convexities, also seen along the left putamen. No hydrocephalus. No primary infarct findings. Vascular: Major flow voids are preserved. Skull and  upper cervical spine: Negative for marrow lesion Sinuses/Orbits: Negative Other: These results were called by telephone at the time of interpretation on 12/19/2016 at 8:30 pm to Dr. Lajean Saver  , who verbally acknowledged these results. IMPRESSION: 1. Findings of posterior reversible encephalopathy syndrome. 2. Size stable posterior right frontal hematoma compared to CTA 1 hour prior. Electronically Signed   By: Monte Fantasia M.D.   On: 12/19/2016 20:35   LINES/TUBES: Peritoneal dialysis catheter RUE Fistula LUE PIV's   ASSESSMENT / PLAN: 50yoF with history of ESRD on Peritoneal Dialysis, HTN, Right renal mass, and Anemia, who was found down on the floor seizing today and found to have a large right frontal hemorrhage with surrounding edema and mass effect, also with small SAH and signs of PRES.  PULMONARY No active issues  CARDIOVASCULAR 1. HTN emergency with PRES: - continue nicardipine gtt titrated to SBP < 160 - HD today - restart coreg 1/2 home dose - start clonidine 0.1mg  patch (takes at home, rebound risk) - nimodipine? Defer to neurology  RENAL ESRD on PD;  Hyperkalemia> improved Metabolic acidosis - renal consult appreciated - HD today  HEMATOLOGIC Polcythemia? Hgb 17.9? Seems spurious -repeat CBC  NEUROLOGIC Intracranial hemorrhage; SAH;  PRES: -Repeat head CT pending -continue BP management as above -stroke order set per neurology   FAMILY  - Updates: patient's father updated at bedside - Patient states that she is FULL CODE  CCM time 36 minutes  Roselie Awkward, MD [place] [facility] Pager: [phone] Cell: [phone] After 3pm or if no response, call [phone]  12/20/2016, 7:28 AM

## 2016-12-20 NOTE — Progress Notes (Signed)
PT Cancellation Note  Patient Details Name: Terry Juarez MRN: [id number] DOB: March 24, 1966   Cancelled Treatment:    Reason Eval/Treat Not Completed: Other (comment) Pt currently getting peritoneal dialysis in room. Will follow up as time allows.   Marguarite Arbour A Carolynn Tuley 12/20/2016, 2:41 PM Wray Kearns, [place], DPT [phone]

## 2016-12-20 NOTE — Progress Notes (Signed)
Modified Barium Swallow Progress Note  Patient Details  Name: Terry Juarez MRN: [id number] Date of Birth: 17-Mar-1966  Today's Date: 12/20/2016  Modified Barium Swallow completed.  Full report located under Chart Review in the Imaging Section.  Brief recommendations include the following:  Clinical Impression  Pt's oropharyngeal swallow appears to be [facility], with no aspiration or penetration observed. Of note, she did not appear to have as many audible swallows or throat clearings during MBS as was observed at bedside, but when she did have throat clearing it was no associated with any airway compromise. She fairly consistently completed second swallows, particularly with thin liquids, but there were only trace residuals (mostly a small coating of the tongue/base of tongue) remaining throughout her oropharynx. Recommend that pt initiate regular textures and thin liquids. SLP f/u not needed for swallowing, but recommend MD order SLP cognitive-linguistic evaluation.   Swallow Evaluation Recommendations       SLP Diet Recommendations: Regular solids;Thin liquid   Liquid Administration via: Cup;Straw   Medication Administration: Whole meds with liquid   Supervision: Patient able to self feed;Intermittent supervision to cue for compensatory strategies   Compensations: Slow rate;Small sips/bites   Postural Changes: Remain semi-upright after after feeds/meals (Comment);Seated upright at 90 degrees   Oral Care Recommendations: Oral care BID        Germain Osgood 12/20/2016,9:43 AM   Germain Osgood, M.A. CCC-SLP [phone]

## 2016-12-21 ENCOUNTER — Inpatient Hospital Stay (HOSPITAL_COMMUNITY): Payer: BLUE CROSS/BLUE SHIELD

## 2016-12-21 ENCOUNTER — Encounter (HOSPITAL_COMMUNITY): Payer: Self-pay | Admitting: *Deleted

## 2016-12-21 DIAGNOSIS — R569 Unspecified convulsions: Secondary | ICD-10-CM

## 2016-12-21 DIAGNOSIS — I161 Hypertensive emergency: Secondary | ICD-10-CM

## 2016-12-21 LAB — POCT I-STAT, CHEM 8
BUN: 134 mg/dL — AB (ref 6–20)
CREATININE: 12.9 mg/dL — AB (ref 0.44–1.00)
Calcium, Ion: 0.83 mmol/L — CL (ref 1.15–1.40)
Chloride: 112 mmol/L — ABNORMAL HIGH (ref 101–111)
GLUCOSE: 106 mg/dL — AB (ref 65–99)
HCT: 60 % — ABNORMAL HIGH (ref 36.0–46.0)
Hemoglobin: 20.4 g/dL — ABNORMAL HIGH (ref 12.0–15.0)
POTASSIUM: 8.2 mmol/L — AB (ref 3.5–5.1)
Sodium: 135 mmol/L (ref 135–145)
TCO2: 18 mmol/L (ref 0–100)

## 2016-12-21 LAB — BASIC METABOLIC PANEL
Anion gap: 18 — ABNORMAL HIGH (ref 5–15)
BUN: 77 mg/dL — ABNORMAL HIGH (ref 6–20)
CHLORIDE: 104 mmol/L (ref 101–111)
CO2: 18 mmol/L — AB (ref 22–32)
Calcium: 8.7 mg/dL — ABNORMAL LOW (ref 8.9–10.3)
Creatinine, Ser: 12.54 mg/dL — ABNORMAL HIGH (ref 0.44–1.00)
GFR calc non Af Amer: 3 mL/min — ABNORMAL LOW (ref >60)
GFR, EST AFRICAN AMERICAN: 3 mL/min — AB (ref >60)
Glucose, Bld: 98 mg/dL (ref 65–99)
POTASSIUM: 4.5 mmol/L (ref 3.5–5.1)
Sodium: 140 mmol/L (ref 135–145)

## 2016-12-21 LAB — CBC
HEMATOCRIT: 59.2 % — AB (ref 36.0–46.0)
HEMOGLOBIN: 19.1 g/dL — AB (ref 12.0–15.0)
MCH: 33.3 pg (ref 26.0–34.0)
MCHC: 32.3 g/dL (ref 30.0–36.0)
MCV: 103.3 fL — ABNORMAL HIGH (ref 78.0–100.0)
Platelets: 215 10*3/uL (ref 150–400)
RBC: 5.73 MIL/uL — AB (ref 3.87–5.11)
RDW: 15.5 % (ref 11.5–15.5)
WBC: 9.3 10*3/uL (ref 4.0–10.5)

## 2016-12-21 LAB — HOMOCYSTEINE: HOMOCYSTEINE-NORM: 21 umol/L — AB (ref 0.0–15.0)

## 2016-12-21 LAB — PROTIME-INR
INR: 0.93
PROTHROMBIN TIME: 12.4 s (ref 11.4–15.2)

## 2016-12-21 LAB — RPR: RPR Ser Ql: NONREACTIVE

## 2016-12-21 MED ORDER — FAMOTIDINE 20 MG PO TABS
20.0000 mg | ORAL_TABLET | Freq: Every day | ORAL | Status: DC
  Administered 2016-12-21 – 2017-01-04 (×14): 20 mg via ORAL
  Filled 2016-12-21 (×14): qty 1

## 2016-12-21 MED ORDER — RENA-VITE PO TABS
1.0000 | ORAL_TABLET | Freq: Every day | ORAL | Status: DC
  Administered 2016-12-21 – 2017-01-04 (×13): 1 via ORAL
  Filled 2016-12-21 (×16): qty 1

## 2016-12-21 MED ORDER — CLEVIDIPINE BUTYRATE 0.5 MG/ML IV EMUL
0.0000 mg/h | INTRAVENOUS | Status: DC
  Administered 2016-12-21: 8 mg/h via INTRAVENOUS
  Administered 2016-12-21: 1 mg/h via INTRAVENOUS
  Administered 2016-12-22: 4 mg/h via INTRAVENOUS
  Administered 2016-12-22: 7 mg/h via INTRAVENOUS
  Administered 2016-12-22 – 2016-12-23 (×2): 4 mg/h via INTRAVENOUS
  Administered 2016-12-23: 3 mg/h via INTRAVENOUS
  Filled 2016-12-21 (×5): qty 50

## 2016-12-21 MED ORDER — LOSARTAN POTASSIUM 50 MG PO TABS
100.0000 mg | ORAL_TABLET | Freq: Every day | ORAL | Status: DC
  Administered 2016-12-22 – 2017-01-02 (×10): 100 mg via ORAL
  Filled 2016-12-21 (×10): qty 2

## 2016-12-21 MED ORDER — LABETALOL HCL 5 MG/ML IV SOLN
10.0000 mg | INTRAVENOUS | Status: DC | PRN
  Administered 2016-12-21: 10 mg via INTRAVENOUS
  Administered 2016-12-24 – 2016-12-26 (×7): 20 mg via INTRAVENOUS
  Filled 2016-12-21 (×8): qty 4

## 2016-12-21 MED ORDER — PANTOPRAZOLE SODIUM 40 MG PO TBEC
40.0000 mg | DELAYED_RELEASE_TABLET | Freq: Two times a day (BID) | ORAL | Status: DC
  Administered 2016-12-21 – 2016-12-24 (×7): 40 mg via ORAL
  Filled 2016-12-21 (×7): qty 1

## 2016-12-21 MED ORDER — SENNOSIDES-DOCUSATE SODIUM 8.6-50 MG PO TABS
1.0000 | ORAL_TABLET | Freq: Two times a day (BID) | ORAL | Status: DC
  Administered 2016-12-21 – 2017-01-04 (×26): 1 via ORAL
  Filled 2016-12-21 (×26): qty 1

## 2016-12-21 MED ORDER — CLONIDINE HCL 0.1 MG PO TABS
0.1000 mg | ORAL_TABLET | Freq: Three times a day (TID) | ORAL | Status: DC
  Administered 2016-12-21 – 2017-01-02 (×31): 0.1 mg via ORAL
  Filled 2016-12-21 (×32): qty 1

## 2016-12-21 MED ORDER — STROKE: EARLY STAGES OF RECOVERY BOOK
Freq: Once | Status: AC
  Administered 2016-12-21: 18:00:00
  Filled 2016-12-21: qty 1

## 2016-12-21 MED ORDER — LABETALOL HCL 5 MG/ML IV SOLN: 10.0000 mg | INTRAVENOUS | Status: DC | PRN

## 2016-12-21 MED ORDER — HYDRALAZINE HCL 20 MG/ML IJ SOLN: 10.0000 mg | INTRAMUSCULAR | Status: DC | PRN

## 2016-12-21 MED ORDER — CARVEDILOL 12.5 MG PO TABS
12.5000 mg | ORAL_TABLET | Freq: Two times a day (BID) | ORAL | Status: DC
  Administered 2016-12-21 – 2017-01-04 (×26): 12.5 mg via ORAL
  Filled 2016-12-21 (×26): qty 1

## 2016-12-21 MED ORDER — CLONIDINE HCL 0.1 MG PO TABS: 0.1000 mg | ORAL_TABLET | Freq: Three times a day (TID) | ORAL | Status: DC | PRN

## 2016-12-21 MED ORDER — LEVETIRACETAM 500 MG PO TABS
500.0000 mg | ORAL_TABLET | Freq: Two times a day (BID) | ORAL | Status: DC
  Administered 2016-12-21 – 2016-12-24 (×7): 500 mg via ORAL
  Filled 2016-12-21 (×7): qty 1

## 2016-12-21 MED ORDER — AMLODIPINE BESYLATE 10 MG PO TABS
10.0000 mg | ORAL_TABLET | Freq: Every day | ORAL | Status: DC
  Administered 2016-12-21 – 2017-01-02 (×12): 10 mg via ORAL
  Filled 2016-12-21 (×12): qty 1

## 2016-12-21 NOTE — Progress Notes (Signed)
Responded to consult for creating advanced directive. Review of notes left questionable that pt was able to do so today, which nurse and mother-in-law in rm both confirmed. Provided spiritual/emotional support and prayer to latter, whose daughter is on the way. Advised that they can ask nurse to page a chaplain at any time.    12/21/16 1200  Clinical Encounter Type  Visited With Patient and family together;Health care provider  Visit Type Initial;Psychological support;Spiritual support;Social support;Critical Care  Referral From Nurse  Spiritual Encounters  Spiritual Needs Prayer;Emotional  Stress Factors  Patient Stress Factors Health changes;Loss of control  Family Stress Factors Family relationships;Health changes;Loss of control   Gerrit Heck, Chaplain

## 2016-12-21 NOTE — Progress Notes (Signed)
PULMONARY / CRITICAL CARE MEDICINE   Name: Terry Juarez MRN: [id number] DOB: 30-Nov-1965    ADMISSION DATE:  12/19/2016 CONSULTATION DATE: Dr Ashok Cordia  CHIEF COMPLAINT: ICH  BRIEF:  51 y/o female with a PMH of ESRD who developed a subarachnoid hemorrhage on 6/16 and was admitted for the same.     SUBJECTIVE:   Lethargy worse today per RN. Patient complains of some intermittent dyspnea, feels good presently, but no complaints otherwise.   VITAL SIGNS: BP 140/78   Pulse 67   Temp 98.2 F (36.8 C) (Axillary)   Resp 12   Ht 5\' 5"  (1.651 m)   Wt 54.6 kg (120 lb 5.9 oz)   SpO2 99%   BMI 20.03 kg/m   HEMODYNAMICS:  2 PIV's (18G and 20G in LUE) RUE AV fistula   INTAKE / OUTPUT: I/O last 3 completed shifts: In: 12081.6 [P.O.:820; I.V.:1254.6; Other:10007] Out: 09323 [Urine:335; Other:12104]  PHYSICAL EXAMINATION: General: adult female of normal body habitus in NAD HENT: [place]/AT, PERRL, no JVD PULM: Respirations even, unlabored, CTAB CV: RRR, no mrg GI: BS+, soft, nontender MSK: normal bulk and tone Neuro: sleepy but easily awakens and responds to questions appropriately.   LABS:  BMET  Recent Labs Lab 12/19/16 2344 12/20/16 0014 12/21/16 0305  NA 138 138 140  K 5.1 5.2* 4.5  CL 104 103 104  CO2 15* 15* 18*  BUN 98* 100* 77*  CREATININE 13.54* 13.35* 12.54*  GLUCOSE 116* 111* 98   Electrolytes  Recent Labs Lab 12/19/16 2344 12/20/16 0014 12/21/16 0305  CALCIUM 8.7* 8.8* 8.7*  MG  --  2.5*  --   PHOS  --  7.4*  --    CBC  Recent Labs Lab 12/20/16 0140 12/20/16 0847 12/21/16 0305  WBC 10.8* 9.1 9.3  HGB 17.9* 17.7* 19.1*  HCT 56.9* 55.7* 59.2*  PLT 236 208 215   Coag's  Recent Labs Lab 12/19/16 1818 12/19/16 2020  INR 0.97 NOT CALCULATED   Sepsis Markers  Recent Labs Lab 12/19/16 1658 12/19/16 2106  LATICACIDVEN 5.86* 1.54   ABG  Recent Labs Lab 12/19/16 2135 12/20/16 0520  PHART 7.261* 7.299*  PCO2ART 41.4 36.0  PO2ART  133.0* 89.9    Liver Enzymes  Recent Labs Lab 12/19/16 1648  AST 82*  ALT 21  ALKPHOS 43  BILITOT 0.9  ALBUMIN 3.6   Cardiac Enzymes  Recent Labs Lab 12/19/16 2101  TROPONINI 1.21*    Glucose  Recent Labs Lab 12/19/16 1644  GLUCAP 119*    Imaging Dg Swallowing Func-speech Pathology  Result Date: 12/20/2016 Objective Swallowing Evaluation: Type of Study: MBS-Modified Barium Swallow Study Patient Details Name: Terry Juarez MRN: [id number] Date of Birth: [birth year] Today's Date: 12/20/2016 Time: SLP Start Time (ACUTE ONLY): 0914-SLP Stop Time (ACUTE ONLY): 4270 SLP Time Calculation (min) (ACUTE ONLY): 13 min Past Medical History: Past Medical History: Diagnosis Date . Anemia  . Bruises easily  . Dialysis patient ([place])  . ESRD on dialysis ([place])  . Hyperlipidemia  . Hypertension  . Renal disorder   rt renal mass / < functioning of left kidney - being prepared for possible dialysis . Right renal mass  Past Surgical History: Past Surgical History: Procedure Laterality Date . AV FISTULA PLACEMENT Right 07/11/2014  Procedure: ARTERIOVENOUS (AV) FISTULA CREATION RIGHT ARM BRACHIO-CEPHALIC WITH ATTEMPTED RADIO-CEPHALIC (AV) FISTULA;  Surgeon: Mal Misty, MD;  Location: [place];  Service: Vascular;  Laterality: Right; . COLONOSCOPY WITH PROPOFOL N/A 09/04/2016  Procedure: COLONOSCOPY WITH  PROPOFOL;  Surgeon: Carol Ada, MD;  Location: [facility];  Service: Endoscopy;  Laterality: N/A; . ECTOPIC PREGNANCY SURGERY  1987 . ESOPHAGOGASTRODUODENOSCOPY N/A 07/30/2016  Procedure: ESOPHAGOGASTRODUODENOSCOPY (EGD);  Surgeon: Carol Ada, MD;  Location: [facility] ENDOSCOPY;  Service: Endoscopy;  Laterality: N/A;  Bedside . ESOPHAGOGASTRODUODENOSCOPY (EGD) WITH PROPOFOL N/A 09/04/2016  Procedure: ESOPHAGOGASTRODUODENOSCOPY (EGD) WITH PROPOFOL;  Surgeon: Carol Ada, MD;  Location: [facility]OS[facility];  Service: Endoscopy;  Laterality: N/A; . INSERTION OF DIALYSIS CATHETER Right 07/11/2014  Procedure: INSERTION OF  DIALYSIS CATHETER IN RIGHT INTERNAL JUGULAR ;  Surgeon: Mal Misty, MD;  Location: [place];  Service: Vascular;  Laterality: Right; . LAPAROSCOPIC NEPHRECTOMY Right 07/25/2014  Procedure: RIGHT LAPAROSCOPIC RADICAL NEPHRECTOMY ;  Surgeon: Ardis Hughs, MD;  Location: [facility] ORS;  Service: Urology;  Laterality: Right; . PATCH ANGIOPLASTY Right 07/11/2014  Procedure: PATCH ANGIOPLASTY OF RIGHT RADIAL ARTERY USING CEPHALIC VEIN.;  Surgeon: Mal Misty, MD;  Location: [place];  Service: Vascular;  Laterality: Right; . THROMBECTOMY W/ EMBOLECTOMY Right 07/11/2014  Procedure: THROMBECTOMY OF RIGHT RADIAL ARTERY  ;  Surgeon: Mal Misty, MD;  Location: [facility] OR;  Service: Vascular;  Laterality: Right; HPI: 51 yo F with history of ESRD on Peritoneal Dialysis, HTN, Right renal mass, and Anemia, who was found down on the floor seizing. She was found to have a large right frontal hemorrhage with surrounding edema and mass effect, also with small SAH and signs of PRES. Pt describes subtle dysphagia and vocal quality changes following thyroid surgery in April. Subjective: pt alert, eager for POs, particularly before she starts dialysis Assessment / Plan / Recommendation CHL IP CLINICAL IMPRESSIONS 12/20/2016 Clinical Impression Pt's oropharyngeal swallow appears to be [facility], with no aspiration or penetration observed. Of note, she did not appear to have as many audible swallows or throat clearings during MBS as was observed at bedside, but when she did have throat clearing it was no associated with any airway compromise. She fairly consistently completed second swallows, particularly with thin liquids, but there were only trace residuals (mostly a small coating of the tongue/base of tongue) remaining throughout her oropharynx. Recommend that pt initiate regular textures and thin liquids. SLP f/u not needed for swallowing, but recommend MD order SLP cognitive-linguistic evaluation. SLP Visit Diagnosis Dysphagia, unspecified (R13.10)  Attention and concentration deficit following -- Frontal lobe and executive function deficit following -- Impact on safety and function Mild aspiration risk   CHL IP TREATMENT RECOMMENDATION 12/20/2016 Treatment Recommendations No treatment recommended at this time   Prognosis 12/20/2016 Prognosis for Safe Diet Advancement Good Barriers to Reach Goals -- Barriers/Prognosis Comment -- CHL IP DIET RECOMMENDATION 12/20/2016 SLP Diet Recommendations Regular solids;Thin liquid Liquid Administration via Cup;Straw Medication Administration Whole meds with liquid Compensations Slow rate;Small sips/bites Postural Changes Remain semi-upright after after feeds/meals (Comment);Seated upright at 90 degrees   CHL IP OTHER RECOMMENDATIONS 12/20/2016 Recommended Consults -- Oral Care Recommendations Oral care BID Other Recommendations --   CHL IP FOLLOW UP RECOMMENDATIONS 12/20/2016 Follow up Recommendations Other (comment)   No flowsheet data found.     CHL IP ORAL PHASE 12/20/2016 Oral Phase WFL Oral - Pudding Teaspoon -- Oral - Pudding Cup -- Oral - Honey Teaspoon -- Oral - Honey Cup -- Oral - Nectar Teaspoon -- Oral - Nectar Cup -- Oral - Nectar Straw -- Oral - Thin Teaspoon -- Oral - Thin Cup -- Oral - Thin Straw -- Oral - Puree -- Oral - Mech Soft -- Oral - Regular -- Oral -  Multi-Consistency -- Oral - Pill -- Oral Phase - Comment --  CHL IP PHARYNGEAL PHASE 12/20/2016 Pharyngeal Phase WFL Pharyngeal- Pudding Teaspoon -- Pharyngeal -- Pharyngeal- Pudding Cup -- Pharyngeal -- Pharyngeal- Honey Teaspoon -- Pharyngeal -- Pharyngeal- Honey Cup -- Pharyngeal -- Pharyngeal- Nectar Teaspoon -- Pharyngeal -- Pharyngeal- Nectar Cup -- Pharyngeal -- Pharyngeal- Nectar Straw -- Pharyngeal -- Pharyngeal- Thin Teaspoon -- Pharyngeal -- Pharyngeal- Thin Cup -- Pharyngeal -- Pharyngeal- Thin Straw -- Pharyngeal -- Pharyngeal- Puree -- Pharyngeal -- Pharyngeal- Mechanical Soft -- Pharyngeal -- Pharyngeal- Regular -- Pharyngeal -- Pharyngeal-  Multi-consistency -- Pharyngeal -- Pharyngeal- Pill -- Pharyngeal -- Pharyngeal Comment --  CHL IP CERVICAL ESOPHAGEAL PHASE 12/20/2016 Cervical Esophageal Phase WFL Pudding Teaspoon -- Pudding Cup -- Honey Teaspoon -- Honey Cup -- Nectar Teaspoon -- Nectar Cup -- Nectar Straw -- Thin Teaspoon -- Thin Cup -- Thin Straw -- Puree -- Mechanical Soft -- Regular -- Multi-consistency -- Pill -- Cervical Esophageal Comment -- No flowsheet data found. Germain Osgood 12/20/2016, 9:44 AM  Germain Osgood, M.A. CCC-SLP [phone]             LINES/TUBES: Peritoneal dialysis catheter RUE Fistula LUE PIV's   ASSESSMENT / PLAN: 50yoF with history of ESRD on Peritoneal Dialysis, HTN, Right renal mass, and Anemia, who was found down on the floor seizing today and found to have a large right frontal hemorrhage with surrounding edema and mass effect, also with small SAH and signs of PRES.   PULMONARY No active issues  NEUROLOGIC Intracranial hemorrhage: continues to expand on CT head 6/18 SAH PRES -BP management as below -stroke order set per neurology  CARDIOVASCULAR HTN emergency with PRES: - Clevidipine infusion per stroke service as she continues to bleed on CT scan this AM, neuro rec lower BP goal < 157mmHg - Continue home coreg (half dose), clonidine patch.  - Telemetry monitoring  RENAL ESRD on PD Hyperkalemia > improved Metabolic acidosis > improving - PD/HD per nephrology  HEMATOLOGIC Polycythemia - consistent on serial CBC. Etiology unclear, perhaps dehydration.  - KVO ivf from neuro standpoint.  FAMILY  - Updates: patient's mother updated bedside - Patient states that she is FULL CODE  Terry Juarez, AGACNP-BC [place] Pulmonology/Critical Care Pager [phone] or [phone]  12/21/2016 9:20 AM

## 2016-12-21 NOTE — Progress Notes (Signed)
STROKE TEAM PROGRESS NOTE   SUBJECTIVE (INTERVAL HISTORY) Family member (mother in law) present at bedside. I discussed dx and treatment with her. Patient up in the chair. Also involved in the conversation. Bleeding slightly worse this am on CT. Her BP overnight was high. Decreased SBP goal to <140 and cleviprex ordered.   OBJECTIVE Temp:  [97.8 F (36.6 C)-98.4 F (36.9 C)] 98.2 F (36.8 C) (06/18 0800) Pulse Rate:  [58-189] 67 (06/18 0900) Resp:  [11-27] 12 (06/18 0900) BP: (102-188)/(56-109) 140/78 (06/18 0900) SpO2:  [85 %-100 %] 99 % (06/18 0900) Weight:  [54.6 kg (120 lb 5.9 oz)] 54.6 kg (120 lb 5.9 oz) (06/18 0100)  CBC:   Recent Labs Lab 12/19/16 1851 12/20/16 0140 12/20/16 0847 12/21/16 0305  WBC QUESTIONABLE RESULTS, RECOMMEND RECOLLECT TO VERIFY 10.8* 9.1 9.3  NEUTROABS 3.6 9.0*  --   --   HGB QUESTIONABLE RESULTS, RECOMMEND RECOLLECT TO VERIFY 17.9* 17.7* 19.1*  HCT 29.7* 56.9* 55.7* 59.2*  MCV 113.4* 102.9* 102.0* 103.3*  PLT QUESTIONABLE RESULTS, RECOMMEND RECOLLECT TO VERIFY 236 208 409    Basic Metabolic Panel:   Recent Labs Lab 12/20/16 0014 12/21/16 0305  NA 138 140  K 5.2* 4.5  CL 103 104  CO2 15* 18*  GLUCOSE 111* 98  BUN 100* 77*  CREATININE 13.35* 12.54*  CALCIUM 8.8* 8.7*  MG 2.5*  --   PHOS 7.4*  --     Lipid Panel:     Component Value Date/Time   CHOL 214 (H) 02/12/2015 0511   TRIG 42 02/12/2015 0511   HDL 83 02/12/2015 0511   CHOLHDL 2.6 02/12/2015 0511   VLDL 8 02/12/2015 0511   LDLCALC 123 (H) 02/12/2015 0511   HgbA1c: No results found for: HGBA1C Urine Drug Screen:     Component Value Date/Time   LABOPIA NONE DETECTED 12/19/2016 2051   COCAINSCRNUR NONE DETECTED 12/19/2016 2051   LABBENZ POSITIVE (A) 12/19/2016 2051   AMPHETMU NONE DETECTED 12/19/2016 2051   THCU NONE DETECTED 12/19/2016 2051   LABBARB NONE DETECTED 12/19/2016 2051    Alcohol Level No results found for: [place] I have personally reviewed the  radiological images below and agree with the radiology interpretations.  Ct Head Wo Contrast 12/21/2016 1. Continued increase in size of right frontal parenchymal hemorrhage with extra-axial extension. 2. Slight increase in associated mass effect. 12/20/2016 1. Slightly increased size of right frontal intraparenchymal hematoma with unchanged right convexity subarachnoid blood, compared to head CTA performed 12/19/2016. Compared to the initial head CT from this presentation, obtained at 5:02 p.m. on 12/19/2016, the hematoma has increased in size.  2. No new hemorrhage, hydrocephalus or mass effect.  12/19/2016 1. Right posterior frontal lobe acute hemorrhage measuring up to 29 mm with small area of surrounding edema and local mass effect. No significant midline shift or herniation. Follow-up to ensure resolution is recommended to exclude an underlying mass.  2. Small volume of subarachnoid hemorrhage overlying the right frontal convexity.  3. Hypoattenuation within subcortical white matter and bilateral parietal and occipital lobes. Findings are suggestive of PRES in the setting of hypertension. This can be further characterized with MRI of the brain.   Ct Angio Head W Or Wo Contrast Ct Angio Neck W And/or Wo Contrast 12/19/2016 1. No vascular explanation for the right frontal parietal hematoma. Dural venous sinuses are patent and there is no evidence of vascular malformation.  2. The 16 cc hematoma is mildly increased from earlier CT. Negative for shift  or herniation.  3. Fibromuscular dysplasia of the bilateral cervical ICA.  4. Moderate atherosclerosis for age. Negative for flow limiting stenosis.    Mr Brain Wo Contrast 12/19/2016 1. Findings of posterior reversible encephalopathy syndrome.  2. Size stable posterior right frontal hematoma compared to CTA 1 hour prior.   Ct Cervical Spine Wo Contrast 12/19/2016 No acute fracture or dislocation of the cervical spine.   Dg Swallowing  Func-speech Pathology 12/20/2016 SLP Diet Recommendations Regular solids;Thin liquid Liquid Administration via Cup;Straw Medication Administration Whole meds with liquid.         EEG 12/21/2016 This awake and asleep EEG is abnormal due to diffuse slowing of the waking background.   PHYSICAL EXAM  Temp:  [97.2 F (36.2 C)-98.3 F (36.8 C)] 97.7 F (36.5 C) (06/19 0400) Pulse Rate:  [53-105] 53 (06/19 0530) Resp:  [11-18] 14 (06/19 0530) BP: (100-185)/(58-102) 127/69 (06/19 0530) SpO2:  [90 %-100 %] 96 % (06/19 0530) Weight:  [123 lb 0.3 oz (55.8 kg)] 123 lb 0.3 oz (55.8 kg) (06/19 0220)  General - Well nourished, well developed, lethargic, sitting in chair  Ophthalmologic - Fundi not visualized due to noncooperation.  Cardiovascular - Regular rate and rhythm.  Neuro - lethargic but able to cooperate with exam, orientated to time, people and place, self and age. Hypophonia but no aphasia, follows commands, right gaze preference, barely cross midline, PERRL, blinking to visual threat on the right, left facial droop, tongue midline. LUE and LLE withdraw on pain stimulation. RUE and RLE spontaneous movement, 4/5. Sensation symmetrical, not cooperative on coordination test, gait not tested.    ASSESSMENT/PLAN [name] is a 51 y.o. female with history of a renal cell carcinoma, hypertension, end-stage renal disease on peritoneal dialysis, hyperlipidemia, and anemia presenting with seizure, left-sided weakness, and right frontal headache. She did not receive IV t-PA due to [place].  ICH: right frontal ICH with SAH in setting of hypertension with increase in hemorrhage size and MRI evidence of PRES.   Resultant left hemiplegia and left facial droop  CT head -  Right posterior frontal lobe acute hemorrhage measuring up to 29 mm with small area of surrounding edema and local mass effect.  MRI head - Findings of PRES.  CTA - No vascular explanation for the right frontal parietal  hematoma. Fibromuscular dysplasia of the bilateral cervical ICA.   Repeat CT head x 2 showed slowing increase in hemorrhage size  Repeat CT in am  2D Echo - 07/30/2016 - EF 60-65%. No cardiac source of emboli identified.  EEG - diffuse slowing  LDL - not indicated  HgbA1c - not indicated  VTE prophylaxis - SCDs Diet renal with fluid restriction Fluid restriction: 1200 mL Fluid; Room service appropriate? Yes; Fluid consistency: Thin  No antithrombotic prior to admission, now on No antithrombotic due to hemorrhage  Ongoing aggressive stroke risk factor management  Therapy recommendations:  CIR  Disposition: Pending  Hypertensive Emergency  BP 178/115 on admission, up to 198/117,  elevated   Variable BP past 24h, as high as 185/102 with worsening hemorrhage and cerebral edema  Decreased SBP goal to < 140 given increase in hemorrhage.  cleviprex added for BP control  Continue norvasc, coreg, catapress.  Nephrology on board   Long-term BP goal normotensive  PRES  MRI confirmed PRES  Likely related to ESRD and hypertension  BP goal < 140 due to increased hematoma  Close BP monitoring  ESRD on PD  Nephrology on board  Continue PD  Looking at transition to HD  Hyperlipidemia  Listed on hx  Not on statin PTA  Not a statin candidate at this time given hemorrhage  Seizure on admission  Started On Keppra   No further seizure  EEG diffuse slowing  Other Active Problems  Hx renal cell cancer s/p R nephrectomy  Hyperkalemia - nephrology on board.  Anemia of CKD  TSH 15.736 - need synthroid supplement  Hospital day # 2  This patient is critically ill due to right frontal ICH with SAH, PRES, ESRD on PD, hypertensive emergency and at significant risk of neurological worsening, death form hematoma expansion, status epilepticus, encephalopathy, renal failure, hyperkalemia. This patient's care requires constant monitoring of vital signs, hemodynamics,  respiratory and cardiac monitoring, review of multiple databases, neurological assessment, discussion with family, other specialists and medical decision making of high complexity. I spent 40 minutes of neurocritical care time in the care of this patient. I had long discussion with mother in law at bedside, updated pt current condition, treatment plan and potential prognosis.     Rosalin Hawking, MD PhD Stroke Neurology 12/22/2016 6:22 AM   To contact Stroke Continuity provider, please refer to [email or web address] After hours, contact General Neurology

## 2016-12-21 NOTE — Progress Notes (Signed)
Bedside EEG completed, results pending. 

## 2016-12-21 NOTE — Evaluation (Signed)
Physical Therapy Evaluation Patient Details Name: Terry Juarez MRN: [id number] DOB: 1966-01-03 Today's Date: 12/21/2016   History of Present Illness  Pt is a 51 y/o female who presents s/p witnessed seizure at home with new L-sided weakness. No further seizure activity noted since admission. Imaging revealed R frontal lobe hemorrhage.  Clinical Impression  Pt admitted with above diagnosis. Pt currently with functional limitations due to the deficits listed below (see PT Problem List). At the time of PT eval pt was able to perform transfers with up to +2 max assist for balance support and safety. Pt was able to transfer to recliner chair with assist for advancement of LE's and upright posture. Pt will benefit from skilled PT to increase their independence and safety with mobility to allow discharge to the venue listed below.       Follow Up Recommendations CIR;Supervision/Assistance - 24 hour    Equipment Recommendations  Rolling walker with 5" wheels;Wheelchair (measurements PT);Wheelchair cushion (measurements PT)    Recommendations for Other Services       Precautions / Restrictions Precautions Precautions: Fall Precaution Comments: Peritoneal dialysis patient Restrictions Weight Bearing Restrictions: No      Mobility  Bed Mobility Overal bed mobility: Needs Assistance Bed Mobility: Rolling;Sidelying to Sit Rolling: Max assist Sidelying to sit: Max assist;+2 for physical assistance       General bed mobility comments: Assist required for all aspects of bed mobility. Hand-over-hand assist provided for pt to reach for railing with RUE. Bed pad used for scooting assistance. Constant hand-on assist provided to maintain sitting balance.   Transfers Overall transfer level: Needs assistance Equipment used: 2 person hand held assist Transfers: Sit to/from [facility] Sit to Stand: Mod assist;+2 physical assistance Stand pivot transfers: Max assist;+2 physical  assistance       General transfer comment: L knee blocked during transition to stand. Pt was able to hold to therapist's arm with RUE and attempt steps around to the recliner chair. Noted hyperextension of the L knee.   Ambulation/Gait                Stairs            Wheelchair Mobility    Modified Rankin (Stroke Patients Only) Modified Rankin (Stroke Patients Only) Pre-Morbid Rankin Score: No symptoms Modified Rankin: Severe disability     Balance Overall balance assessment: Needs assistance Sitting-balance support: Feet supported;Single extremity supported Sitting balance-Leahy Scale: Poor Sitting balance - Comments: Pushing to the L with RUE on the bed.  Postural control: Posterior lean;Left lateral lean Standing balance support: Bilateral upper extremity supported;During functional activity Standing balance-Leahy Scale: Zero Standing balance comment: +2 required                             Pertinent Vitals/Pain Pain Assessment: Faces Faces Pain Scale: No hurt    Home Living Family/patient expects to be discharged to:: Private residence Living Arrangements: Non-relatives/Friends Available Help at Discharge: Friend(s);Available PRN/intermittently Type of Home: House Home Access: Stairs to enter   [employer] of Steps: 3 Home Layout: One level Home Equipment: Shower seat      Prior Function Level of Independence: Independent         Comments: Per mother-in-law     Hand Dominance        Extremity/Trunk Assessment   Upper Extremity Assessment Upper Extremity Assessment: Defer to OT evaluation;LUE deficits/detail LUE Deficits / Details: Noted LUE weakness. Pt  unable to squeeze my hand. Pt reports normal feeling to this therapist in shoulder, forearm and hand. Please refer to OT evaluation for further details.     Lower Extremity Assessment Lower Extremity Assessment: LLE deficits/detail LLE Deficits / Details:  Decreased strength and AROM consistent with diagnosis. Pt unable to lift LLE or bend L knee against gravity. Noted no movement of toes when asked to wiggle them. Noted hyperextension of the L knee during standing activity.     Cervical / Trunk Assessment Cervical / Trunk Assessment: Normal  Communication   Communication: No difficulties  Cognition Arousal/Alertness: Lethargic;Suspect due to medications Behavior During Therapy: Flat affect Overall Cognitive Status: Difficult to assess                                        General Comments      Exercises     Assessment/Plan    PT Assessment Patient needs continued PT services  PT Problem List Decreased strength;Decreased range of motion;Decreased activity tolerance;Decreased balance;Decreased mobility;Decreased knowledge of use of DME;Decreased safety awareness;Decreased knowledge of precautions       PT Treatment Interventions DME instruction;Stair training;Gait training;Functional mobility training;Therapeutic activities;Therapeutic exercise;Neuromuscular re-education;Patient/family education    PT Goals (Current goals can be found in the Care Plan section)  Acute Rehab PT Goals Patient Stated Goal: Pt did not state goals. PT Goal Formulation: Patient unable to participate in goal setting Time For Goal Achievement: 01/04/17 Potential to Achieve Goals: Good    Frequency Min 4X/week   Barriers to discharge        Co-evaluation               AM-PAC PT "6 Clicks" Daily Activity  Outcome Measure Difficulty turning over in bed (including adjusting bedclothes, sheets and blankets)?: Total Difficulty moving from lying on back to sitting on the side of the bed? : Total Difficulty sitting down on and standing up from a chair with arms (e.g., wheelchair, bedside commode, etc,.)?: Total Help needed moving to and from a bed to chair (including a wheelchair)?: Total Help needed walking in hospital room?:  Total Help needed climbing 3-5 steps with a railing? : Total 6 Click Score: 6    End of Session Equipment Utilized During Treatment: Gait belt Activity Tolerance: Patient limited by lethargy Patient left: in chair;with call bell/phone within reach;with chair alarm set;with family/visitor present Nurse Communication: Mobility status PT Visit Diagnosis: Hemiplegia and hemiparesis Hemiplegia - Right/Left: Left Hemiplegia - caused by: Unspecified    Time: 7011-0034 PT Time Calculation (min) (ACUTE ONLY): 25 min   Charges:   PT Evaluation $PT Eval Moderate Complexity: 1 Procedure PT Treatments $Therapeutic Activity: 8-22 mins   PT G Codes:        [name], PT, DPT Acute Rehabilitation Services Pager: [phone]   [name] 12/21/2016, 11:34 AM

## 2016-12-21 NOTE — Progress Notes (Signed)
Discussed blood pressure parameters with Dr. Jonnie Finner with Nephrology.  Per Cardene order, BP should be between 145-165 during the first 48 hours while patient will be getting peritoneal dialysis.  He also verbally stated not to treat the patient's blood pressure unless it went higher than 892 systolically. This parameter was then immediately discussed with Dr. Merlene Laughter from Stroke Team, and he agreed to allow this be the parameters for this patient, specifically stating that his specific parameters for this patient were 180/100.

## 2016-12-21 NOTE — Procedures (Signed)
ELECTROENCEPHALOGRAM REPORT  Date of Study: 12/21/2016  Patient's Name: Terry Juarez MRN: [id number] Date of Birth: 1966/01/21  Referring Provider: Phillips Odor, MD  Clinical History: 51 year old female on peritoneal dialysis who presents with new onset seizure and left hemiplegia.  She was found to have a right frontoparietal hemorrhage and PRES on imaging.  Medications: amLODipine (NORVASC) tablet 10 mg  carvedilol (COREG) tablet 12.5 mg  clevidipine (CLEVIPREX) infusion 0.5 mg/mL  cloNIDine (CATAPRES) tablet 0.1 mg  dialysis solution 2.5% low-MG/low-CA dianeal solutionfamotidine (PEPCID) tablet 20 mg  gentamicin cream (GARAMYCIN) 0.1 % 1 application heparin 8,527 Units in dialysis solution 2.5% low-MG/low-CA 5,000 mL dialysis solution  hydrALAZINE (APRESOLINE) injection 10-20 mg  HYDROmorphone (DILAUDID) injection 0.5 mg  labetalol (NORMODYNE,TRANDATE) injection 10-20 mg levETIRAcetam (KEPPRA) tablet 500 mg  losartan (COZAAR) tablet 100 mg  multivitamin (RENA-VIT) tablet 1 tablet  pantoprazole (PROTONIX) EC tablet 40 mg  senna-docusate (Senokot-S) tablet 1 tablet   Technical Summary: A multichannel digital EEG recording measured by the international 10-20 system with electrodes applied with paste and impedances below 5000 ohms performed as portable with EKG monitoring in an awake and asleep patient.  Hyperventilation and photic stimulation were not performed.  The digital EEG was referentially recorded, reformatted, and digitally filtered in a variety of bipolar and referential montages for optimal display.   Description: The patient is awake and asleep during the recording.  During maximal wakefulness, there is a symmetric, medium voltage 9 Hz posterior dominant rhythm that attenuates with eye opening. This is admixed with diffuse 4-5 Hz theta and 2-3 Hz delta slowing of the waking background.  During drowsiness and sleep, there is an increase in theta slowing of the  background.  Vertex waves and symmetric sleep spindles were seen.  There were no epileptiform discharges or electrographic seizures seen.    EKG lead was unremarkable.  Impression: This awake and asleep EEG is abnormal due to diffuse slowing of the waking background.  Clinical Correlation of the above findings indicates diffuse cerebral dysfunction that is non-specific in etiology and can be seen with hypoxic/ischemic injury, toxic/metabolic encephalopathies, neurodegenerative disorders, or medication effect.  The absence of epileptiform discharges does not rule out a clinical diagnosis of epilepsy.  Clinical correlation is advised.  Metta Clines, DO

## 2016-12-21 NOTE — Progress Notes (Signed)
Inpatient Rehabilitation  PT has evaluated pt. and is recommending CIR.  OT has deferred evaluation due to pt's increasing lethargy and medical issues.  Pt. will likely need CIR consult when medically ready and tolerating therapies.  Please order consult when appropriate.    [place] Admissions Coordinator Cell [phone] Office [phone]

## 2016-12-21 NOTE — Progress Notes (Signed)
OT Cancellation Note  Patient Details Name: Terry Juarez MRN: [id number] DOB: 09/20/1965   Cancelled Treatment:    Reason Eval/Treat Not Completed: Medical issues which prohibited therapy. Pt with increased lethargy. RR 11. Nsg concerned regarding status. Will assess when medically appropriate.   [place], OT/L  [phone] 12/21/2016 12/21/2016, 11:46 AM

## 2016-12-21 NOTE — Progress Notes (Signed)
[facility] ROUNDING NOTE   Subjective:   Interval History:  Acute hemorrhagic stroke with left hemiparesis. Will continue  With PD today will need to convert to hemodialysis   Objective:  Vital signs in last 24 hours:  Temp:  [97.8 F (36.6 C)-98.4 F (36.9 C)] 98.2 F (36.8 C) (06/18 0800) Pulse Rate:  [58-189] 67 (06/18 0900) Resp:  [11-27] 12 (06/18 0900) BP: (102-188)/(56-109) 140/78 (06/18 0900) SpO2:  [85 %-100 %] 99 % (06/18 0900) Weight:  [120 lb 5.9 oz (54.6 kg)] 120 lb 5.9 oz (54.6 kg) (06/18 0100)  Weight change: -7.1 oz (-0.2 kg) Filed Weights   12/20/16 0150 12/21/16 0100  Weight: 120 lb 13 oz (54.8 kg) 120 lb 5.9 oz (54.6 kg)    Intake/Output: I/O last 3 completed shifts: In: 12081.6 [P.O.:820; I.V.:1254.6; Other:10007] Out: 18841 [Urine:335; Other:12104]   Intake/Output this shift:  No intake/output data recorded.  CVS- RRR RS- CTA ABD- BS present soft non-distended EXT- no edema   Basic Metabolic Panel:  Recent Labs Lab 12/19/16 1648 12/19/16 1657 12/19/16 2101 12/19/16 2344 12/20/16 0014 12/21/16 0305  NA 141 135 138 138 138 140  K 6.0* 8.2* 4.3 5.1 5.2* 4.5  CL 103 112* 103 104 103 104  CO2 13*  --  18* 15* 15* 18*  GLUCOSE 101* 106* 98 116* 111* 98  BUN 95* 134* 90* 98* 100* 77*  CREATININE 13.97* 12.90* 13.31* 13.54* 13.35* 12.54*  CALCIUM 8.5*  --  8.5* 8.7* 8.8* 8.7*  MG  --   --   --   --  2.5*  --   PHOS  --   --   --   --  7.4*  --     Liver Function Tests:  Recent Labs Lab 12/19/16 1648  AST 82*  ALT 21  ALKPHOS 43  BILITOT 0.9  PROT 6.4*  ALBUMIN 3.6   No results for input(s): LIPASE, AMYLASE in the last 168 hours. No results for input(s): AMMONIA in the last 168 hours.  CBC:  Recent Labs Lab 12/19/16 1648 12/19/16 1657 12/19/16 1851 12/20/16 0140 12/20/16 0847 12/21/16 0305  WBC 9.1  --  QUESTIONABLE RESULTS, RECOMMEND RECOLLECT TO VERIFY 10.8* 9.1 9.3  NEUTROABS 6.6  --  3.6 9.0*  --    --   HGB 18.7* 20.4* QUESTIONABLE RESULTS, RECOMMEND RECOLLECT TO VERIFY 17.9* 17.7* 19.1*  HCT 58.6* 60.0* 29.7* 56.9* 55.7* 59.2*  MCV 104.3*  --  113.4* 102.9* 102.0* 103.3*  PLT 239  --  QUESTIONABLE RESULTS, RECOMMEND RECOLLECT TO VERIFY 236 208 215    Cardiac Enzymes:  Recent Labs Lab 12/19/16 2101  TROPONINI 1.21*    BNP: Invalid input(s): POCBNP  CBG:  Recent Labs Lab 12/19/16 Ty Ty*    Microbiology: Results for orders placed or performed during the hospital encounter of 12/19/16  MRSA PCR Screening     Status: None   Collection Time: 12/20/16  1:50 AM  Result Value Ref Range Status   MRSA by PCR NEGATIVE NEGATIVE Final    Comment:        The GeneXpert MRSA Assay (FDA approved for NASAL specimens only), is one component of a comprehensive MRSA colonization surveillance program. It is not intended to diagnose MRSA infection nor to guide or monitor treatment for MRSA infections.     Coagulation Studies:  Recent Labs  12/19/16 1818 12/19/16 2020  LABPROT 12.9 QUESTIONABLE RESULTS, RECOMMEND RECOLLECT TO VERIFY  INR 0.97 NOT CALCULATED  Urinalysis: No results for input(s): COLORURINE, LABSPEC, PHURINE, GLUCOSEU, HGBUR, BILIRUBINUR, KETONESUR, PROTEINUR, UROBILINOGEN, NITRITE, LEUKOCYTESUR in the last 72 hours.  Invalid input(s): APPERANCEUR    Imaging: Ct Angio Head W Or Wo Contrast  Result Date: 12/19/2016 CLINICAL DATA:  Intraparenchymal and subarachnoid hemorrhage. EXAM: CT ANGIOGRAPHY HEAD AND NECK TECHNIQUE: Multidetector CT imaging of the head and neck was performed using the standard protocol during bolus administration of intravenous contrast. Multiplanar CT image reconstructions and MIPs were obtained to evaluate the vascular anatomy. Carotid stenosis measurements (when applicable) are obtained utilizing NASCET criteria, using the distal internal carotid diameter as the denominator. CONTRAST:  50 cc Isovue 370 intravenous  COMPARISON:  Noncontrast head CT from earlier today FINDINGS: CTA NECK FINDINGS Aortic arch: Atheromatous wall thickening that is mild. No acute finding or aneurysm. Right carotid system: Mild atheromatous plaque at the common carotid bifurcation without stenosis or ulceration. Beaded ICA consistent with fibromuscular dysplasia. No superimposed acute dissection. Left carotid system: Scattered atheromatous plaque on the distal common carotid and proximal ICA, moderate for age. No flow limiting stenosis or ulceration. Beading of the left ICA consistent with fibromuscular dysplasia. No superimposed dissection. Vertebral arteries: Proximal subclavian atherosclerosis without flow limiting stenosis. Both vertebral arteries are smooth and widely patent to the dura. Left vertebral dominance. Skeleton: No acute or aggressive finding. Other neck: Negative for mass or inflammation. Upper chest: Negative Review of the MIP images confirms the above findings CTA HEAD FINDINGS Anterior circulation: Moderate calcified plaque on the carotid siphons without flow limiting stenosis. Standard branching. There is no evidence of aneurysm or vascular malformation. No major branch occlusion or proximal flow limiting stenosis. Posterior circulation: Left dominant vertebral artery. The vertebral and basilar arteries are smooth and widely patent. Aplastic right and mildly hypoplastic left P1 segments with large posterior communicating arteries. No branch occlusion, stenosis, aneurysm, or evidence of vascular malformation. Venous sinuses: Patent on the delayed phase. Anatomic variants: Fetal type PCA circulation, especially on the right. Delayed phase: No masslike enhancement. Pending brain MRI. The right frontal parietal hematoma is larger than before, measuring 27 x 34 x 35 mm as compared to 20 x 31 x 28 mm. Local subarachnoid hemorrhage and mild cerebral edema without shift or herniation. Review of the MIP images confirms the above findings  IMPRESSION: 1. No vascular explanation for the right frontal parietal hematoma. Dural venous sinuses are patent and there is no evidence of vascular malformation. 2. The 16 cc hematoma is mildly increased from earlier CT. Negative for shift or herniation. 3. Fibromuscular dysplasia of the bilateral cervical ICA. 4. Moderate atherosclerosis for age. Negative for flow limiting stenosis. Electronically Signed   By: Monte Fantasia M.D.   On: 12/19/2016 19:37   Ct Head Wo Contrast  Result Date: 12/21/2016 CLINICAL DATA:  Intracranial hemorrhage. EXAM: CT HEAD WITHOUT CONTRAST TECHNIQUE: Contiguous axial images were obtained from the base of the skull through the vertex without intravenous contrast. COMPARISON:  CT head without contrast 12/20/2016 and 12/19/2016. FINDINGS: Brain: The focal hyperdense hemorrhage within the high right frontal lobe extends to the cortex. The area of hemorrhage continues to slowly increase in size. Maximal dimensions on coronal and sagittal reformats are now 4.1 x 3.4 by 5.5 cm. This compares with previous measurements of 3.8 x 8 3.3 x 5.2 cm and is significantly larger than the regional measurements. Adjacent extra-axial hemorrhage is similar the prior study. Mild edema surrounds the hemorrhage. Local mass effect is present with effacement of the sulci. Minimal midline shift  is present. There is increasing effacement of the right lateral ventricle. Vascular: Atherosclerotic calcifications are present within the cavernous internal carotid arteries. There is no hyperdense vessel. Skull: The calvarium is intact. No focal lytic or blastic lesions are present. Sinuses/Orbits: The paranasal sinuses and mastoid air cells are clear. The globes and orbits are unremarkable. IMPRESSION: 1. Continued increase in size of right frontal parenchymal hemorrhage with extra-axial extension. 2. Slight increase in associated mass effect. These results were called by telephone at the time of interpretation  on 12/21/2016 at 9:21 am to Dr. Rosalin Hawking , who verbally acknowledged these results. Electronically Signed   By: San Morelle M.D.   On: 12/21/2016 09:29   Ct Head Wo Contrast  Result Date: 12/20/2016 CLINICAL DATA:  Intracranial hemorrhage follow up EXAM: CT HEAD WITHOUT CONTRAST TECHNIQUE: Contiguous axial images were obtained from the base of the skull through the vertex without intravenous contrast. COMPARISON:  Brain MRI 12/19/2016 Head CT 12/19/2016 Head CTA 12/19/2016 FINDINGS: Brain: Intraparenchymal hematoma centered in the right frontal lobe now measures 3.8 x 3.4 x 3.8 cm, previously 3.7 x 3.8 by 3.0 cm. Surrounding edema is unchanged. There is subarachnoid blood over the right convexity, also unchanged. There is no midline shift. No hydrocephalus. Basal cisterns remain patent. Vascular: No hyperdense vessel or unexpected calcification. Skull: Normal visualized skull base, calvarium and extracranial soft tissues. Sinuses/Orbits: No sinus fluid levels or advanced mucosal thickening. No mastoid effusion. Normal orbits. IMPRESSION: 1. Slightly increased size of right frontal intraparenchymal hematoma with unchanged right convexity subarachnoid blood, compared to head CTA performed 12/19/2016. Compared to the initial head CT from this presentation, obtained at 5:02 p.m. on 12/19/2016, the hematoma has increased in size. 2. No new hemorrhage, hydrocephalus or mass effect. Electronically Signed   By: Ulyses Jarred M.D.   On: 12/20/2016 06:29   Ct Head Wo Contrast  Result Date: 12/19/2016 CLINICAL DATA:  51 y/o  F; seizure. EXAM: CT HEAD WITHOUT CONTRAST CT CERVICAL SPINE WITHOUT CONTRAST TECHNIQUE: Multidetector CT imaging of the head and cervical spine was performed following the standard protocol without intravenous contrast. Multiplanar CT image reconstructions of the cervical spine were also generated. COMPARISON:  02/11/2015 CT of the head. FINDINGS: CT HEAD FINDINGS Brain: Right posterior  frontal lobe acute hemorrhage measuring 22 x 29 x 21 mm (AP x ML x CC series 3 image 26 and series 5, image 34). Small volume of subarachnoid hemorrhage with overlying the right frontal convexity and along the falx. There is a small surrounding region of hypoattenuation in the brain compatible with edema with local mass effect effacing sulci. No significant midline shift. No evidence for herniation. No large territory acute stroke identified. There are foci of hypoattenuation within subcortical white matter in the bilateral parietal lobes and occipital lobes. The distribution is suggestive of PRES. Vascular: Extensive calcific atherosclerosis of the carotid siphons. Skull: Normal. Negative for fracture or focal lesion. Sinuses/Orbits: No acute finding. Other: None. CT CERVICAL SPINE FINDINGS Alignment: Normal. Skull base and vertebrae: No acute fracture. No primary bone lesion or focal pathologic process. Soft tissues and spinal canal: No prevertebral fluid or swelling. No visible canal hematoma. Disc levels: Straightening of cervical lordosis and discogenic degenerative changes with mild disc space narrowing and marginal osteophytes from the C3 through C5 levels. Upper chest: Negative. Other: Negative appear IMPRESSION: 1. Right posterior frontal lobe acute hemorrhage measuring up to 29 mm with small area of surrounding edema and local mass effect. No significant midline shift or  herniation. Follow-up to ensure resolution is recommended to exclude an underlying mass. 2. Small volume of subarachnoid hemorrhage overlying the right frontal convexity. 3. Hypoattenuation within subcortical white matter and bilateral parietal and occipital lobes. Findings are suggestive of PRES in the setting of hypertension. This can be further characterized with MRI of the brain. 4. No acute fracture or dislocation of the cervical spine. These results were called by telephone at the time of interpretation on 12/19/2016 at 5:12 pm to Dr.  Lajean Saver , who verbally acknowledged these results. Electronically Signed   By: Kristine Garbe M.D.   On: 12/19/2016 17:23   Ct Angio Neck W And/or Wo Contrast  Result Date: 12/19/2016 CLINICAL DATA:  Intraparenchymal and subarachnoid hemorrhage. EXAM: CT ANGIOGRAPHY HEAD AND NECK TECHNIQUE: Multidetector CT imaging of the head and neck was performed using the standard protocol during bolus administration of intravenous contrast. Multiplanar CT image reconstructions and MIPs were obtained to evaluate the vascular anatomy. Carotid stenosis measurements (when applicable) are obtained utilizing NASCET criteria, using the distal internal carotid diameter as the denominator. CONTRAST:  50 cc Isovue 370 intravenous COMPARISON:  Noncontrast head CT from earlier today FINDINGS: CTA NECK FINDINGS Aortic arch: Atheromatous wall thickening that is mild. No acute finding or aneurysm. Right carotid system: Mild atheromatous plaque at the common carotid bifurcation without stenosis or ulceration. Beaded ICA consistent with fibromuscular dysplasia. No superimposed acute dissection. Left carotid system: Scattered atheromatous plaque on the distal common carotid and proximal ICA, moderate for age. No flow limiting stenosis or ulceration. Beading of the left ICA consistent with fibromuscular dysplasia. No superimposed dissection. Vertebral arteries: Proximal subclavian atherosclerosis without flow limiting stenosis. Both vertebral arteries are smooth and widely patent to the dura. Left vertebral dominance. Skeleton: No acute or aggressive finding. Other neck: Negative for mass or inflammation. Upper chest: Negative Review of the MIP images confirms the above findings CTA HEAD FINDINGS Anterior circulation: Moderate calcified plaque on the carotid siphons without flow limiting stenosis. Standard branching. There is no evidence of aneurysm or vascular malformation. No major branch occlusion or proximal flow limiting  stenosis. Posterior circulation: Left dominant vertebral artery. The vertebral and basilar arteries are smooth and widely patent. Aplastic right and mildly hypoplastic left P1 segments with large posterior communicating arteries. No branch occlusion, stenosis, aneurysm, or evidence of vascular malformation. Venous sinuses: Patent on the delayed phase. Anatomic variants: Fetal type PCA circulation, especially on the right. Delayed phase: No masslike enhancement. Pending brain MRI. The right frontal parietal hematoma is larger than before, measuring 27 x 34 x 35 mm as compared to 20 x 31 x 28 mm. Local subarachnoid hemorrhage and mild cerebral edema without shift or herniation. Review of the MIP images confirms the above findings IMPRESSION: 1. No vascular explanation for the right frontal parietal hematoma. Dural venous sinuses are patent and there is no evidence of vascular malformation. 2. The 16 cc hematoma is mildly increased from earlier CT. Negative for shift or herniation. 3. Fibromuscular dysplasia of the bilateral cervical ICA. 4. Moderate atherosclerosis for age. Negative for flow limiting stenosis. Electronically Signed   By: Monte Fantasia M.D.   On: 12/19/2016 19:37   Ct Cervical Spine Wo Contrast  Result Date: 12/19/2016 CLINICAL DATA:  51 y/o  F; seizure. EXAM: CT HEAD WITHOUT CONTRAST CT CERVICAL SPINE WITHOUT CONTRAST TECHNIQUE: Multidetector CT imaging of the head and cervical spine was performed following the standard protocol without intravenous contrast. Multiplanar CT image reconstructions of the cervical spine were  also generated. COMPARISON:  02/11/2015 CT of the head. FINDINGS: CT HEAD FINDINGS Brain: Right posterior frontal lobe acute hemorrhage measuring 22 x 29 x 21 mm (AP x ML x CC series 3 image 26 and series 5, image 34). Small volume of subarachnoid hemorrhage with overlying the right frontal convexity and along the falx. There is a small surrounding region of hypoattenuation in  the brain compatible with edema with local mass effect effacing sulci. No significant midline shift. No evidence for herniation. No large territory acute stroke identified. There are foci of hypoattenuation within subcortical white matter in the bilateral parietal lobes and occipital lobes. The distribution is suggestive of PRES. Vascular: Extensive calcific atherosclerosis of the carotid siphons. Skull: Normal. Negative for fracture or focal lesion. Sinuses/Orbits: No acute finding. Other: None. CT CERVICAL SPINE FINDINGS Alignment: Normal. Skull base and vertebrae: No acute fracture. No primary bone lesion or focal pathologic process. Soft tissues and spinal canal: No prevertebral fluid or swelling. No visible canal hematoma. Disc levels: Straightening of cervical lordosis and discogenic degenerative changes with mild disc space narrowing and marginal osteophytes from the C3 through C5 levels. Upper chest: Negative. Other: Negative appear IMPRESSION: 1. Right posterior frontal lobe acute hemorrhage measuring up to 29 mm with small area of surrounding edema and local mass effect. No significant midline shift or herniation. Follow-up to ensure resolution is recommended to exclude an underlying mass. 2. Small volume of subarachnoid hemorrhage overlying the right frontal convexity. 3. Hypoattenuation within subcortical white matter and bilateral parietal and occipital lobes. Findings are suggestive of PRES in the setting of hypertension. This can be further characterized with MRI of the brain. 4. No acute fracture or dislocation of the cervical spine. These results were called by telephone at the time of interpretation on 12/19/2016 at 5:12 pm to Dr. Lajean Saver , who verbally acknowledged these results. Electronically Signed   By: Kristine Garbe M.D.   On: 12/19/2016 17:23   Mr Brain Wo Contrast  Result Date: 12/19/2016 CLINICAL DATA:  Seizure and altered mental status. EXAM: MRI HEAD WITHOUT CONTRAST  TECHNIQUE: Multiplanar, multiecho pulse sequences of the brain and surrounding structures were obtained without intravenous contrast. COMPARISON:  CTA head neck from earlier the same day FINDINGS: Brain: Acute parenchymal hematoma in the posterior right frontal lobe is size stable from CT 60 minutes prior. There is subarachnoid FLAIR signal hyperintensity along the right more than left convexity. On the right there is associated susceptibility on gradient imaging correlating with subarachnoid hemorrhage by CT. There is bilateral cortical and subcortical T2 hyperintensity within the predominantly high and posterior cerebral convexities, also seen along the left putamen. No hydrocephalus. No primary infarct findings. Vascular: Major flow voids are preserved. Skull and upper cervical spine: Negative for marrow lesion Sinuses/Orbits: Negative Other: These results were called by telephone at the time of interpretation on 12/19/2016 at 8:30 pm to Dr. Lajean Saver , who verbally acknowledged these results. IMPRESSION: 1. Findings of posterior reversible encephalopathy syndrome. 2. Size stable posterior right frontal hematoma compared to CTA 1 hour prior. Electronically Signed   By: Monte Fantasia M.D.   On: 12/19/2016 20:35   Dg Swallowing Func-speech Pathology  Result Date: 12/20/2016 Objective Swallowing Evaluation: Type of Study: MBS-Modified Barium Swallow Study Patient Details Name: Terry Juarez MRN: [id number] Date of Birth: [birth year] Today's Date: 12/20/2016 Time: SLP Start Time (ACUTE ONLY): 0914-SLP Stop Time (ACUTE ONLY): 1017 SLP Time Calculation (min) (ACUTE ONLY): 13 min Past Medical History: Past Medical History:  Diagnosis Date . Anemia  . Bruises easily  . Dialysis patient ([facility])  . ESRD on dialysis ([place])  . Hyperlipidemia  . Hypertension  . Renal disorder   rt renal mass / < functioning of left kidney - being prepared for possible dialysis . Right renal mass  Past Surgical History: Past Surgical  History: Procedure Laterality Date . AV FISTULA PLACEMENT Right 07/11/2014  Procedure: ARTERIOVENOUS (AV) FISTULA CREATION RIGHT ARM BRACHIO-CEPHALIC WITH ATTEMPTED RADIO-CEPHALIC (AV) FISTULA;  Surgeon: Mal Misty, MD;  Location: [place] [facility];  Service: Vascular;  Laterality: Right; . COLONOSCOPY WITH PROPOFOL N/A 09/04/2016  Procedure: COLONOSCOPY WITH PROPOFOL;  Surgeon: Carol Ada, MD;  Location: [facility];  Service: Endoscopy;  Laterality: N/A; . ECTOPIC PREGNANCY SURGERY  1987 . ESOPHAGOGASTRODUODENOSCOPY N/A 07/30/2016  Procedure: ESOPHAGOGASTRODUODENOSCOPY (EGD);  Surgeon: Carol Ada, MD;  Location: [facility];  Service: Endoscopy;  Laterality: N/A;  Bedside . ESOPHAGOGASTRODUODENOSCOPY (EGD) WITH PROPOFOL N/A 09/04/2016  Procedure: ESOPHAGOGASTRODUODENOSCOPY (EGD) WITH PROPOFOL;  Surgeon: Carol Ada, MD;  Location: [facility];  Service: Endoscopy;  Laterality: N/A; . INSERTION OF DIALYSIS CATHETER Right 07/11/2014  Procedure: INSERTION OF DIALYSIS CATHETER IN RIGHT INTERNAL JUGULAR ;  Surgeon: Mal Misty, MD;  Location: [place];  Service: Vascular;  Laterality: Right; . LAPAROSCOPIC NEPHRECTOMY Right 07/25/2014  Procedure: RIGHT LAPAROSCOPIC RADICAL NEPHRECTOMY ;  Surgeon: Ardis Hughs, MD;  Location: [facility];  Service: Urology;  Laterality: Right; . PATCH ANGIOPLASTY Right 07/11/2014  Procedure: PATCH ANGIOPLASTY OF RIGHT RADIAL ARTERY USING CEPHALIC VEIN.;  Surgeon: Mal Misty, MD;  Location: [place];  Service: Vascular;  Laterality: Right; . THROMBECTOMY W/ EMBOLECTOMY Right 07/11/2014  Procedure: THROMBECTOMY OF RIGHT RADIAL ARTERY  ;  Surgeon: Mal Misty, MD;  Location: [facility] OR;  Service: Vascular;  Laterality: Right; HPI: 51 yo F with history of ESRD on Peritoneal Dialysis, HTN, Right renal mass, and Anemia, who was found down on the floor seizing. She was found to have a large right frontal hemorrhage with surrounding edema and mass effect, also with small SAH and signs of PRES. Pt describes  subtle dysphagia and vocal quality changes following thyroid surgery in April. Subjective: pt alert, eager for POs, particularly before she starts dialysis Assessment / Plan / Recommendation CHL IP CLINICAL IMPRESSIONS 12/20/2016 Clinical Impression Pt's oropharyngeal swallow appears to be [facility], with no aspiration or penetration observed. Of note, she did not appear to have as many audible swallows or throat clearings during MBS as was observed at bedside, but when she did have throat clearing it was no associated with any airway compromise. She fairly consistently completed second swallows, particularly with thin liquids, but there were only trace residuals (mostly a small coating of the tongue/base of tongue) remaining throughout her oropharynx. Recommend that pt initiate regular textures and thin liquids. SLP f/u not needed for swallowing, but recommend MD order SLP cognitive-linguistic evaluation. SLP Visit Diagnosis Dysphagia, unspecified (R13.10) Attention and concentration deficit following -- Frontal lobe and executive function deficit following -- Impact on safety and function Mild aspiration risk   CHL IP TREATMENT RECOMMENDATION 12/20/2016 Treatment Recommendations No treatment recommended at this time   Prognosis 12/20/2016 Prognosis for Safe Diet Advancement Good Barriers to Reach Goals -- Barriers/Prognosis Comment -- CHL IP DIET RECOMMENDATION 12/20/2016 SLP Diet Recommendations Regular solids;Thin liquid Liquid Administration via Cup;Straw Medication Administration Whole meds with liquid Compensations Slow rate;Small sips/bites Postural Changes Remain semi-upright after after feeds/meals (Comment);Seated upright at 90 degrees   CHL IP OTHER RECOMMENDATIONS 12/20/2016 Recommended  Consults -- Oral Care Recommendations Oral care BID Other Recommendations --   CHL IP FOLLOW UP RECOMMENDATIONS 12/20/2016 Follow up Recommendations Other (comment)   No flowsheet data found.     CHL IP ORAL PHASE 12/20/2016 Oral  Phase WFL Oral - Pudding Teaspoon -- Oral - Pudding Cup -- Oral - Honey Teaspoon -- Oral - Honey Cup -- Oral - Nectar Teaspoon -- Oral - Nectar Cup -- Oral - Nectar Straw -- Oral - Thin Teaspoon -- Oral - Thin Cup -- Oral - Thin Straw -- Oral - Puree -- Oral - Mech Soft -- Oral - Regular -- Oral - Multi-Consistency -- Oral - Pill -- Oral Phase - Comment --  CHL IP PHARYNGEAL PHASE 12/20/2016 Pharyngeal Phase WFL Pharyngeal- Pudding Teaspoon -- Pharyngeal -- Pharyngeal- Pudding Cup -- Pharyngeal -- Pharyngeal- Honey Teaspoon -- Pharyngeal -- Pharyngeal- Honey Cup -- Pharyngeal -- Pharyngeal- Nectar Teaspoon -- Pharyngeal -- Pharyngeal- Nectar Cup -- Pharyngeal -- Pharyngeal- Nectar Straw -- Pharyngeal -- Pharyngeal- Thin Teaspoon -- Pharyngeal -- Pharyngeal- Thin Cup -- Pharyngeal -- Pharyngeal- Thin Straw -- Pharyngeal -- Pharyngeal- Puree -- Pharyngeal -- Pharyngeal- Mechanical Soft -- Pharyngeal -- Pharyngeal- Regular -- Pharyngeal -- Pharyngeal- Multi-consistency -- Pharyngeal -- Pharyngeal- Pill -- Pharyngeal -- Pharyngeal Comment --  CHL IP CERVICAL ESOPHAGEAL PHASE 12/20/2016 Cervical Esophageal Phase WFL Pudding Teaspoon -- Pudding Cup -- Honey Teaspoon -- Honey Cup -- Nectar Teaspoon -- Nectar Cup -- Nectar Straw -- Thin Teaspoon -- Thin Cup -- Thin Straw -- Puree -- Mechanical Soft -- Regular -- Multi-consistency -- Pill -- Cervical Esophageal Comment -- No flowsheet data found. Germain Osgood 12/20/2016, 9:44 AM  Germain Osgood, M.A. CCC-SLP [phone]               Medications:   . sodium chloride    . clevidipine    . dialysis solution 2.5% low-MG/low-CA     .  stroke: mapping our early stages of recovery book   Does not apply Once  . amLODipine  10 mg Oral Daily  . carvedilol  12.5 mg Oral BID WC  . cloNIDine  0.1 mg Oral TID  . famotidine  20 mg Oral Daily  . gentamicin cream  1 application Topical Daily  . levETIRAcetam  500 mg Oral BID  . losartan  100 mg Oral QHS  .  multivitamin  1 tablet Oral Daily  . pantoprazole  40 mg Oral BID  . senna-docusate  1 tablet Oral BID   sodium chloride, dianeal solution for CAPD/CCPD with heparin, hydrALAZINE, HYDROmorphone (DILAUDID) injection, labetalol  Assessment/ Plan:  1 Acute R hemorrhagic stroke w left hemiparesis - per CCM/ nsurg 2 HTN'sive emergency w PRES on MRI - BP's are too low now, should keep SBP 140 - 165 range for 1-2 more days.  Have d/w RN and modified BP medications.  3 ESRD on CCPD - no gross vol excess 4 Hist renal cell cancer, sp R nephrectomy 5 Anemia of CKD - labs conflicting 6 Volume no vol excess 7 Hyperkalemia - better, treat w PD today. I doubt that patient will be able to resume PD for some time and will need to transition to hemodialysis    LOS: 2 Lindsie Simar W @TODAY @9 :41 AM

## 2016-12-22 ENCOUNTER — Inpatient Hospital Stay (HOSPITAL_COMMUNITY): Payer: BLUE CROSS/BLUE SHIELD

## 2016-12-22 LAB — CBC
HCT: 56.3 % — ABNORMAL HIGH (ref 36.0–46.0)
HCT: 52.9 % — ABNORMAL HIGH (ref 36.0–46.0)
Hemoglobin: 18 g/dL — ABNORMAL HIGH (ref 12.0–15.0)
Hemoglobin: 18.1 g/dL — ABNORMAL HIGH (ref 12.0–15.0)
MCH: 32.8 pg (ref 26.0–34.0)
MCH: 34.3 pg — AB (ref 26.0–34.0)
MCHC: 32 g/dL (ref 30.0–36.0)
MCHC: 34.2 g/dL (ref 30.0–36.0)
MCV: 102.7 fL — ABNORMAL HIGH (ref 78.0–100.0)
MCV: 100.4 fL — ABNORMAL HIGH (ref 78.0–100.0)
PLATELETS: 259 10*3/uL (ref 150–400)
PLATELETS: 293 10*3/uL (ref 150–400)
RBC: 5.48 MIL/uL — AB (ref 3.87–5.11)
RBC: 5.27 MIL/uL — ABNORMAL HIGH (ref 3.87–5.11)
RDW: 15.8 % — ABNORMAL HIGH (ref 11.5–15.5)
RDW: 15.5 % (ref 11.5–15.5)
WBC: 8.8 10*3/uL (ref 4.0–10.5)
WBC: 12.6 10*3/uL — ABNORMAL HIGH (ref 4.0–10.5)

## 2016-12-22 LAB — BASIC METABOLIC PANEL
Anion gap: 16 — ABNORMAL HIGH (ref 5–15)
Anion gap: 14 (ref 5–15)
BUN: 85 mg/dL — ABNORMAL HIGH (ref 6–20)
BUN: 62 mg/dL — AB (ref 6–20)
CALCIUM: 8.9 mg/dL (ref 8.9–10.3)
CO2: 15 mmol/L — ABNORMAL LOW (ref 22–32)
CO2: 18 mmol/L — ABNORMAL LOW (ref 22–32)
CREATININE: 13.36 mg/dL — AB (ref 0.44–1.00)
Calcium: 8.7 mg/dL — ABNORMAL LOW (ref 8.9–10.3)
Chloride: 103 mmol/L (ref 101–111)
Chloride: 102 mmol/L (ref 101–111)
Creatinine, Ser: 11.42 mg/dL — ABNORMAL HIGH (ref 0.44–1.00)
GFR calc Af Amer: 3 mL/min — ABNORMAL LOW (ref >60)
GFR calc Af Amer: 4 mL/min — ABNORMAL LOW (ref >60)
GFR, EST NON AFRICAN AMERICAN: 3 mL/min — AB (ref >60)
GFR, EST NON AFRICAN AMERICAN: 3 mL/min — AB (ref >60)
GLUCOSE: 167 mg/dL — AB (ref 65–99)
Glucose, Bld: 95 mg/dL (ref 65–99)
POTASSIUM: 6.2 mmol/L — AB (ref 3.5–5.1)
Potassium: 5.1 mmol/L (ref 3.5–5.1)
SODIUM: 134 mmol/L — AB (ref 135–145)
Sodium: 134 mmol/L — ABNORMAL LOW (ref 135–145)

## 2016-12-22 LAB — GLUCOSE, CAPILLARY: Glucose-Capillary: 108 mg/dL — ABNORMAL HIGH (ref 65–99)

## 2016-12-22 LAB — ALT: ALT: 14 U/L (ref 14–54)

## 2016-12-22 LAB — AMMONIA: Ammonia: 38 umol/L — ABNORMAL HIGH (ref 9–35)

## 2016-12-22 MED ORDER — SODIUM POLYSTYRENE SULFONATE 15 GM/60ML PO SUSP
30.0000 g | Freq: Once | ORAL | Status: AC
  Administered 2016-12-22: 30 g via ORAL
  Filled 2016-12-22: qty 120

## 2016-12-22 MED ORDER — FENTANYL CITRATE (PF) 100 MCG/2ML IJ SOLN
INTRAMUSCULAR | Status: AC
  Filled 2016-12-22: qty 2

## 2016-12-22 MED ORDER — MIDAZOLAM HCL 2 MG/2ML IJ SOLN
INTRAMUSCULAR | Status: AC
  Filled 2016-12-22: qty 2

## 2016-12-22 NOTE — Progress Notes (Signed)
Physical Therapy Treatment Patient Details Name: Terry Juarez MRN: [id number] DOB: 1966/05/23 Today's Date: 12/22/2016    History of Present Illness Pt is a 51 y/o female who presents s/p witnessed seizure at home with new L-sided weakness. No further seizure activity noted since admission. Imaging revealed R frontal lobe hemorrhage.    PT Comments    Pt progressing slowly towards physical therapy goals. Was able to awaken enough to participate with session. Mostly with attention to the R side but able to pass midline and look to the L with increased time and cues. HR in low to mid 40's upon arrival, and noted HR increased up to 60 bpm during mobility. Upon exit of room, HR back to 47 bpm.    Follow Up Recommendations  CIR;Supervision/Assistance - 24 hour     Equipment Recommendations  Rolling walker with 5" wheels;Wheelchair (measurements PT);Wheelchair cushion (measurements PT)    Recommendations for Other Services       Precautions / Restrictions Precautions Precautions: Fall Precaution Comments: Peritoneal dialysis patient Restrictions Weight Bearing Restrictions: No    Mobility  Bed Mobility Overal bed mobility: Needs Assistance Bed Mobility: Rolling;Sidelying to Sit Rolling: Max assist Sidelying to sit: Mod assist;+2 for physical assistance       General bed mobility comments: Assist required for all aspects of bed mobility. Hand-over-hand assist provided for pt to reach for railing with RUE. Bed pad used for scooting assistance. Constant hand-on assist provided to maintain sitting balance.   Transfers                 General transfer comment: Unable to progress to OOB transfer this session due to increased assist required for sitting balance.   Ambulation/Gait                 Stairs            Wheelchair Mobility    Modified Rankin (Stroke Patients Only) Modified Rankin (Stroke Patients Only) Pre-Morbid Rankin Score: No  symptoms Modified Rankin: Severe disability     Balance Overall balance assessment: Needs assistance Sitting-balance support: Feet supported;Single extremity supported Sitting balance-Leahy Scale: Poor Sitting balance - Comments: Pushing to the L with RUE on the bed.  Postural control: Posterior lean;Left lateral lean                                  Cognition Arousal/Alertness: Lethargic;Suspect due to medications Behavior During Therapy: Flat affect Overall Cognitive Status: Difficult to assess                                        Exercises      General Comments        Pertinent Vitals/Pain Pain Assessment: Faces Faces Pain Scale: No hurt    Home Living     Available Help at Discharge: Family;Friend(s)                Prior Function            PT Goals (current goals can now be found in the care plan section) Acute Rehab PT Goals Patient Stated Goal: Pt did not state goals. PT Goal Formulation: Patient unable to participate in goal setting Time For Goal Achievement: 01/04/17 Potential to Achieve Goals: Good Progress towards PT goals: Progressing toward goals    Frequency  Min 4X/week      PT Plan Current plan remains appropriate    Co-evaluation PT/OT/SLP Co-Evaluation/Treatment: Yes Reason for Co-Treatment: Complexity of the patient's impairments (multi-system involvement);To address functional/ADL transfers;For patient/therapist safety;Necessary to address cognition/behavior during functional activity PT goals addressed during session: Mobility/safety with mobility;Balance        AM-PAC PT "6 Clicks" Daily Activity  Outcome Measure  Difficulty turning over in bed (including adjusting bedclothes, sheets and blankets)?: Total Difficulty moving from lying on back to sitting on the side of the bed? : Total Difficulty sitting down on and standing up from a chair with arms (e.g., wheelchair, bedside commode,  etc,.)?: Total Help needed moving to and from a bed to chair (including a wheelchair)?: Total Help needed walking in hospital room?: Total Help needed climbing 3-5 steps with a railing? : Total 6 Click Score: 6    End of Session Equipment Utilized During Treatment: Gait belt Activity Tolerance: Patient limited by lethargy Patient left: in bed;with call bell/phone within reach;with family/visitor present (SLP present) Nurse Communication: Mobility status PT Visit Diagnosis: Hemiplegia and hemiparesis Hemiplegia - Right/Left: Left Hemiplegia - caused by: Unspecified     Time: 5208-0223 PT Time Calculation (min) (ACUTE ONLY): 25 min  Charges:  $Therapeutic Activity: 8-22 mins                    G Codes:       Terry Juarez, PT, DPT Acute Rehabilitation Services Pager: [phone]    Terry Juarez 12/22/2016, 3:07 PM

## 2016-12-22 NOTE — Evaluation (Signed)
Speech Language Pathology Evaluation Patient Details Name: Terry Juarez MRN: [id number] DOB: 12/17/1965 Today's Date: 12/22/2016 Time: 9417-4081 SLP Time Calculation (min) (ACUTE ONLY): 13 min  Problem List:  Patient Active Problem List   Diagnosis Date Noted  . Intracranial hemorrhage ([place]) 12/19/2016  . Subarachnoid hemorrhage 12/19/2016  . Intracerebral hemorrhage 12/19/2016  . Symptomatic anemia 07/29/2016  . Cough 01/09/2016  . Elevated troponin 02/12/2015  . Headache 02/12/2015  . Syncope 02/12/2015  . ESRD on dialysis ([place]) 02/12/2015  . Hypertension   . Hyperlipidemia   . Anemia   . Essential hypertension   . Cephalalgia   . Right renal mass 07/25/2014   Past Medical History:  Past Medical History:  Diagnosis Date  . Anemia   . Bruises easily   . Dialysis patient ([place])   . ESRD on dialysis ([place])   . Hyperlipidemia   . Hypertension   . Renal disorder    rt renal mass / < functioning of left kidney - being prepared for possible dialysis  . Right renal mass    Past Surgical History:  Past Surgical History:  Procedure Laterality Date  . AV FISTULA PLACEMENT Right 07/11/2014   Procedure: ARTERIOVENOUS (AV) FISTULA CREATION RIGHT ARM BRACHIO-CEPHALIC WITH ATTEMPTED RADIO-CEPHALIC (AV) FISTULA;  Surgeon: Mal Misty, MD;  Location: [facility];  Service: Vascular;  Laterality: Right;  . COLONOSCOPY WITH PROPOFOL N/A 09/04/2016   Procedure: COLONOSCOPY WITH PROPOFOL;  Surgeon: Carol Ada, MD;  Location: [facility] ENDOSCOPY;  Service: Endoscopy;  Laterality: N/A;  . ECTOPIC PREGNANCY SURGERY  1987  . ESOPHAGOGASTRODUODENOSCOPY N/A 07/30/2016   Procedure: ESOPHAGOGASTRODUODENOSCOPY (EGD);  Surgeon: Carol Ada, MD;  Location[facility]COPY;  Service: Endoscopy;  Laterality: N/A;  Bedside  . ESOPHAGOGASTRODUODENOSCOPY (EGD) WITH PROPOFOL N/A 09/04/2016   Procedure: ESOPHAGOGASTRODUODENOSCOPY (EGD) WITH PROPOFOL;  Surgeon: Carol Ada, MD;  Location: [facility] ENDOSCOPY;  Service:  Endoscopy;  Laterality: N/A;  . INSERTION OF DIALYSIS CATHETER Right 07/11/2014   Procedure: INSERTION OF DIALYSIS CATHETER IN RIGHT INTERNAL JUGULAR ;  Surgeon: Mal Misty, MD;  Location: [facility];  Service: Vascular;  Laterality: Right;  . LAPAROSCOPIC NEPHRECTOMY Right 07/25/2014   Procedure: RIGHT LAPAROSCOPIC RADICAL NEPHRECTOMY ;  Surgeon: Ardis Hughs, MD;  Location: [facility] ORS;  Service: Urology;  Laterality: Right;  . PATCH ANGIOPLASTY Right 07/11/2014   Procedure: PATCH ANGIOPLASTY OF RIGHT RADIAL ARTERY USING CEPHALIC VEIN.;  Surgeon: Mal Misty, MD;  Location: [facility];  Service: Vascular;  Laterality: Right;  . THROMBECTOMY W/ EMBOLECTOMY Right 07/11/2014   Procedure: THROMBECTOMY OF RIGHT RADIAL ARTERY  ;  Surgeon: Mal Misty, MD;  [facility] OR;  Service: Vascular;  Laterality: Right;   HPI:  51 yo F with history of ESRD on Peritoneal Dialysis, HTN, Right renal mass, and Anemia, who was found down on the floor seizing. She was found to have a large right frontal hemorrhage with surrounding edema and mass effect, also with small SAH and signs of PRES. Pt had MBS 6/17 recommending regular diet and thin liquids.    Assessment / Plan / Recommendation Clinical Impression  Pt is lethargic but will participate in tasks given Mod cues for sustained attention. Mod cues were also provided for retrieval of new information, emergent/anticipatory awareness, and safety awareness. She has a R sided gaze preference despite cueing from SLP to attempt head turns left. Her speech is moderately dysarthric but likely exacerbated by drowsiness. SLP provided Mod cues for increased volume to facilitate intelligibility at the word  level. Pt will benefit from SLP f/u to maximize functional cognition and communication.    SLP Assessment  SLP Recommendation/Assessment: Patient needs continued Speech Lanaguage Pathology Services SLP Visit Diagnosis: Cognitive communication deficit (R41.841);Dysarthria and  anarthria (R47.1)    Follow Up Recommendations  Inpatient Rehab    Frequency and Duration min 2x/week  2 weeks      SLP Evaluation Cognition  Overall Cognitive Status: Difficult to assess Arousal/Alertness: Lethargic Orientation Level: Oriented X4 Attention: Sustained Sustained Attention: Impaired Sustained Attention Impairment: Verbal basic Memory: Impaired Memory Impairment: Decreased recall of new information;Retrieval deficit Awareness: Impaired Awareness Impairment: Emergent impairment;Anticipatory impairment Safety/Judgment: Impaired       Comprehension  Auditory Comprehension Overall Auditory Comprehension: Appears within functional limits for tasks assessed (simple tasks)    Expression Expression Primary Mode of Expression: Verbal Verbal Expression Overall Verbal Expression: Appears within functional limits for tasks assessed (simple tasks)   Oral / Motor  Motor Speech Overall Motor Speech: Impaired Phonation: Low vocal intensity Resonance: Within functional limits Articulation: Impaired Level of Impairment: Word Intelligibility: Intelligibility reduced Word: 25-49% accurate Effective Techniques: Increased vocal intensity   GO                    Germain Osgood 12/22/2016, 3:06 PM  Germain Osgood, M.A. CCC-SLP [phone]

## 2016-12-22 NOTE — Progress Notes (Signed)
PULMONARY / CRITICAL CARE MEDICINE   Name: Terry Juarez MRN: [id number] DOB: 1966/06/03    ADMISSION DATE:  12/19/2016 CONSULTATION DATE: Dr Ashok Cordia  CHIEF COMPLAINT: ICH  BRIEF:  55yoF with history of ESRD on Peritoneal Dialysis, HTN, Right renal mass, and Anemia, who was last seen in her normal state of health today at 11am. She was found down on the floor seizing later in the day by a person who rents a room from her. She was brought to the ER at approx 4:45pm at which time patient c/o acute left-sided weakness and right frontal HA. Head CT showed large 71mm right frontal hemorrhage with surrounding edema and mass effect, also with small SAH and signs of PRES. CTA Head/Neck showed no AVM's but but show the hematoma was mildly increased from the earlier imaging. Neurology saw patient in the ER and started Nicardipine and Keppra. She was also found to have hyperkalemia which was medically treated. Brain MRI showed no changes in the size of right frontal hematoma compared to CTA; it also confirmed PRES.    SUBJECTIVE:   Unresponsive in HD today, rushed back to ICU for intubation. Upon arrival to ICU she was more awake and decision was made to hold off.    VITAL SIGNS: BP (!) 168/95   Pulse 69   Temp 98 F (36.7 C) (Oral)   Resp 17   Ht 5\' 5"  (1.651 m)   Wt 56.3 kg (124 lb 1.9 oz)   SpO2 99%   BMI 20.65 kg/m   HEMODYNAMICS:  2 PIV's (18G and 20G in LUE) RUE AV fistula   INTAKE / OUTPUT: I/O last 3 completed shifts: In: 10351.6 [P.O.:220; I.V.:124.6; Other:10007] Out: 42706 [Urine:410; Other:12104]  PHYSICAL EXAMINATION:  General: Adult female of normal body habitus in NAD HENT: [place]/AT, PERRL, no JVD PULM: Respirations even, unlabored. Clear.  CV: RRR, no mrg GI: soft, non-tender, non-distended MSK: No acute deformity or ROM limitation Neuro: Lethargic, but MS is improving since in HD.    LABS:  BMET  Recent Labs Lab 12/20/16 0014 12/21/16 0305 12/22/16 0135  NA  138 140 134*  K 5.2* 4.5 6.2*  CL 103 104 103  CO2 15* 18* 15*  BUN 100* 77* 85*  CREATININE 13.35* 12.54* 13.36*  GLUCOSE 111* 98 95   Electrolytes  Recent Labs Lab 12/20/16 0014 12/21/16 0305 12/22/16 0135  CALCIUM 8.8* 8.7* 8.7*  MG 2.5*  --   --   PHOS 7.4*  --   --    CBC  Recent Labs Lab 12/20/16 0847 12/21/16 0305 12/22/16 0135  WBC 9.1 9.3 8.8  HGB 17.7* 19.1* 18.0*  HCT 55.7* 59.2* 56.3*  PLT 208 215 259   Coag's  Recent Labs Lab 12/19/16 1818 12/19/16 2020 12/21/16 1336  INR 0.97 NOT CALCULATED 0.93   Sepsis Markers  Recent Labs Lab 12/19/16 1658 12/19/16 2106  LATICACIDVEN 5.86* 1.54   ABG  Recent Labs Lab 12/19/16 2135 12/20/16 0520  PHART 7.261* 7.299*  PCO2ART 41.4 36.0  PO2ART 133.0* 89.9    Liver Enzymes  Recent Labs Lab 12/19/16 1648 12/22/16 0742  AST 82*  --   ALT 21 14  ALKPHOS 43  --   BILITOT 0.9  --   ALBUMIN 3.6  --    Cardiac Enzymes  Recent Labs Lab 12/19/16 2101  TROPONINI 1.21*    Glucose  Recent Labs Lab 12/19/16 1644 12/22/16 0803  GLUCAP 119* 108*    Imaging Ct Head Wo Contrast  Result Date: 12/22/2016 CLINICAL DATA:  Follow-up examination for acute intracranial hemorrhage. EXAM: CT HEAD WITHOUT CONTRAST TECHNIQUE: Contiguous axial images were obtained from the base of the skull through the vertex without intravenous contrast. COMPARISON:  Prior CT from 12/21/2016. FINDINGS: Brain: Intraparenchymal hemorrhage centered at the high right frontal parietal region again seen. Hemorrhage overall not significantly changed measuring 4.1 x 3.3 x 5.4 cm seen greatest dimensions (previously 4.1 x 3.4 x 5.5 cm). Surrounding low-density vasogenic edema relatively with sulcal effacement relatively similar. Mass effect on the right lateral ventricle which is partially effaced relatively unchanged. Right-to-left midline shift measures approximately 3 mm, similar. No hydrocephalus or ventricular trapping. No  intraventricular extension of hemorrhage. Adjacent small volume subarachnoid hemorrhage noted, similar. Adjacent extra-axial hemorrhage unchanged. No new acute intracranial hemorrhage. No evidence for acute large vessel territory infarct. Vascular: No hyperdense vessel. Scattered vascular calcifications noted within the carotid siphons. Skull: Scalp soft tissues and calvarium unchanged, and remain within normal limits. Sinuses/Orbits: Globes and orbital soft tissues within normal limits. Paranasal sinuses and mastoid air cells remain clear. IMPRESSION: 1. No significant interval change in posterior right frontal intraparenchymal hematoma with extra-axial extension. Similar localized edema and mass effect with 3 mm right-to-left shift. 2. No other new acute intracranial process. Electronically Signed   By: Jeannine Boga M.D.   On: 12/22/2016 04:49   LINES/TUBES: Peritoneal dialysis catheter RUE Fistula LUE PIV's   ASSESSMENT / PLAN: 50yoF with history of ESRD on Peritoneal Dialysis, HTN, Right renal mass, and Anemia, who was found down on the floor seizing today and found to have a large right frontal hemorrhage with surrounding edema and mass effect, also with small SAH and signs of PRES.   PULMONARY Risk for airway compromise - Monitor - Intubation risk  NEUROLOGIC Intracranial hemorrhage: continues to expand on CT head 6/18, stable in 6/19  SAH PRES -BP management as below -stroke order set per neurology  CARDIOVASCULAR HTN emergency with PRES: - Clevidipine infusion per stroke service, lower BP goal < 143mmHg - Continue home coreg (half dose), clonidine patch.  - Telemetry monitoring  RENAL ESRD on PD Hyperkalemia  Metabolic acidosis > improving - HD per nephrology (got 1.5 hours of treatment 6/19) - Repeat BMP now  HEMATOLOGIC Polycythemia - consistent on serial CBC. Etiology unclear. Hgb down somewhat today. - follow CBC, monitor  FAMILY  - Updates: family updated  by Dr. Haydee Monica, AGACNP-BC [facility] Pulmonology/Critical Care Pager [phone] or [phone]  12/22/2016 9:09 AM

## 2016-12-22 NOTE — Progress Notes (Signed)
[facility] Progress Note Patient Name: Terry Juarez DOB: Mar 17, 1966 MRN: [id number]   Date of Service  12/22/2016  HPI/Events of Note  K+ = 6.2 - T wave prominent, however, QRS not widened.   eICU Interventions  Will order: 1. Kayexalate 30 gm PO now. 2. Notify Nephrology so that they might dialyze patient earlier in the day rather than later.      Intervention Category Major Interventions: Electrolyte abnormality - evaluation and management  Sommer,Steven Eugene 12/22/2016, 4:40 AM

## 2016-12-22 NOTE — Evaluation (Signed)
Occupational Therapy Evaluation Patient Details Name: Terry Juarez MRN: [id number] DOB: 1965-08-13 Today's Date: 12/22/2016    History of Present Illness Pt is a 51 y/o female who presents s/p witnessed seizure at home with new L-sided weakness. No further seizure activity noted since admission. Imaging revealed R frontal lobe hemorrhage.   Clinical Impression   PTA, pt was independent with ADL and functional mobility. Pt currently requires max-total assist for the majority of ADL participation. She presents with L sided weakness, R gaze preference, slow processing, and increased lethargy impacting her ability to participate in ADL tasks at [facility]. She requires moderate cues to attend to L side of visual field and body. She would benefit from continued OT services while admitted to improve independence with ADL and functional mobility. Pt demonstrates significant functional decline from PLOF and would best benefit from CIR level therapies post-acute D/C in order to maximize return to independence with ADL. OT will continue to follow while admitted. Of note, pt with HR in low 40's on arrival and increased to 60 bpm during ADL. At end of session, HR had returned to 45-47 bpm.    Follow Up Recommendations  CIR;Supervision/Assistance - 24 hour    Equipment Recommendations  Other (comment) (TBD at next venue of care)    Recommendations for Other Services       Precautions / Restrictions Precautions Precautions: Fall Precaution Comments: Peritoneal dialysis patient Restrictions Weight Bearing Restrictions: No      Mobility Bed Mobility Overal bed mobility: Needs Assistance Bed Mobility: Rolling;Sidelying to Sit Rolling: Max assist Sidelying to sit: Mod assist;+2 for physical assistance       General bed mobility comments: Assist required for all aspects of bed mobility. Hand-over-hand assist provided for pt to reach for railing with RUE. Bed pad used for scooting assistance. Constant  hand-on assist provided to maintain sitting balance.   Transfers                 General transfer comment: Unable to progress to OOB transfer this session due to increased assist required for sitting balance.     Balance Overall balance assessment: Needs assistance Sitting-balance support: Feet supported;Single extremity supported Sitting balance-Leahy Scale: Poor Sitting balance - Comments: Pushing to the L with RUE on the bed.  Postural control: Posterior lean;Left lateral lean                                 ADL either performed or assessed with clinical judgement   ADL Overall ADL's : Needs assistance/impaired     Grooming: Moderate assistance;Sitting Grooming Details (indicate cue type and reason): With max assist for sitting balance.  Upper Body Bathing: Maximal assistance;Sitting   Lower Body Bathing: Total assistance;Bed level   Upper Body Dressing : Maximal assistance;Sitting   Lower Body Dressing: Total assistance;Bed level       Toileting- Clothing Manipulation and Hygiene: Total assistance;Sit to/from stand         General ADL Comments: Unsafe to attempt transfer this session due to decreased sitting balance and medical status per RN and NP. Pt able to engage in ADL tasks and is limited by lethargy and L sided weakness.      Vision   Vision Assessment?: Vision impaired- to be further tested in functional context Additional Comments: Significant R gaze but able to cross midline to look to the L with moderate VC's. Able to make eye-contact during session  with increased time and effort.      Perception     Praxis      Pertinent Vitals/Pain Pain Assessment: Faces Faces Pain Scale: No hurt     Hand Dominance     Extremity/Trunk Assessment Upper Extremity Assessment Upper Extremity Assessment: LUE deficits/detail LUE Deficits / Details: No activation in L UE throughout session (0/5 strength). Unable to grasp hand. Reports no pain  with movement but reports able to feel light touch in hand. No spasticity noted and full PROM present.    Lower Extremity Assessment Lower Extremity Assessment: Defer to PT evaluation   Cervical / Trunk Assessment Cervical / Trunk Assessment: Normal   Communication Communication Communication: No difficulties   Cognition Arousal/Alertness: Lethargic;Suspect due to medications Behavior During Therapy: Flat affect Overall Cognitive Status: Difficult to assess                                 General Comments: Following one-step commands with increased time. Oriented to person, place, month, and year. Level of arousal impacting ability to accurately assess cognition.    General Comments  Mother-in-law present during session. Pt with bradycardia at initiation and close of session with HR 44 at beginning of session and up to 60 during activity. HR back down to 45-47 at end of session once reclined.      Exercises Exercises: Other exercises Other Exercises Other Exercises: Facilitated PROM of L UE in all planes for 5 repetitions each.   Shoulder Instructions      Home Living Family/patient expects to be discharged to:: Private residence Living Arrangements: Non-relatives/Friends Available Help at Discharge: Family;Friend(s) Type of Home: House Home Access: Stairs to enter [employer] of Steps: 3   Home Layout: One level     Bathroom Shower/Tub: Occupational psychologist: Standard Bathroom Accessibility: Yes   Home Equipment: Civil engineer, contracting      Lives With: Other (Comment) (roommate)    Prior Functioning/Environment Level of Independence: Independent        Comments: Per mother-in-law        OT Problem List: Decreased strength;Decreased range of motion;Decreased activity tolerance;Impaired balance (sitting and/or standing);Impaired vision/perception;Decreased coordination;Decreased cognition;Decreased safety awareness;Decreased knowledge  of use of DME or AE;Decreased knowledge of precautions;Impaired UE functional use      OT Treatment/Interventions: Self-care/ADL training;Therapeutic exercise;Neuromuscular education;Energy conservation;DME and/or AE instruction;Therapeutic activities;Patient/family education;Balance training    OT Goals(Current goals can be found in the care plan section) Acute Rehab OT Goals Patient Stated Goal: Pt did not state goals. OT Goal Formulation: Patient unable to participate in goal setting Time For Goal Achievement: 01/05/17 Potential to Achieve Goals: Good ADL Goals Pt Will Perform Grooming: sitting;with min guard assist Pt Will Transfer to Toilet: with min assist;stand pivot transfer;bedside commode Pt/caregiver will Perform Home Exercise Program: Increased ROM;Left upper extremity;Independently;With written HEP provided (Self ROM or PROM) Additional ADL Goal #1: Pt will complete bed mobility with overall min assist in preparation for ADL seated at EOB.  Additional ADL Goal #2: Pt will demonstrate midline gaze during seated grooming tasks for approximately 5 minutes.   OT Frequency: Min 3X/week   Barriers to D/C:            Co-evaluation PT/OT/SLP Co-Evaluation/Treatment: Yes Reason for Co-Treatment: Complexity of the patient's impairments (multi-system involvement);To address functional/ADL transfers;Necessary to address cognition/behavior during functional activity;For patient/therapist safety PT goals addressed during session: Mobility/safety with mobility;Balance OT goals addressed  during session: ADL's and self-care      AM-PAC PT "6 Clicks" Daily Activity     Outcome Measure Help from another person eating meals?: A Lot Help from another person taking care of personal grooming?: A Lot Help from another person toileting, which includes using toliet, bedpan, or urinal?: Total Help from another person bathing (including washing, rinsing, drying)?: A Lot Help from another person  to put on and taking off regular upper body clothing?: A Lot Help from another person to put on and taking off regular lower body clothing?: Total 6 Click Score: 10   End of Session Nurse Communication: Mobility status  Activity Tolerance: Patient limited by lethargy Patient left: in bed;with call bell/phone within reach;with family/visitor present (with SLP)  OT Visit Diagnosis: Hemiplegia and hemiparesis;Other symptoms and signs involving cognitive function Hemiplegia - Right/Left: Left Hemiplegia - caused by: Nontraumatic intracerebral hemorrhage                Time: 1415-1439 OT Time Calculation (min): 24 min Charges:  OT General Charges $OT Visit: 1 Procedure OT Evaluation $OT Eval Moderate Complexity: 1 Procedure G-Codes:     Norman Herrlich, MS OTR/L  Pager: Kaan[place]ali A Jakobi [place] 12/22/2016, 4:57 PM

## 2016-12-22 NOTE — Care Management Note (Signed)
Case Management Note  Patient Details  Name: Terry Juarez MRN: [id number] Date of Birth: Mar 09, 1966  Subjective/Objective:                  Pt is a 51 y/o female who presents s/p witnessed seizure at home with new L-sided weakness.  PTA, pt independent and from home.  Her husband is deceased; mother in law at bedside.    Action/Plan: Met with mother in law, at her request.  She has questions regarding being able to assist pt with her finances and personal matters.  She is wondering if she can obtain POA.  Pt has a father in [place], but per mother in Sports coach, he is sick and unable to come to [place].  I explained that if pt is alert and oriented and expresses desire for her to become her POA, a lawyer can do this for her.  At this point, pt is quite lethargic, and I am not certain she can make this decision.  If she remains in this state, a lawyer may be able to provide guidance on how to handle her financial matters.  Pt's mother in law appreciative of my visit.  She plans to consult a lawyer to discuss her concerns.    Expected Discharge Date:                  Expected Discharge Plan:  IP Rehab Facility  In-House Referral:  Clinical Social Work  Discharge planning Services  CM Consult  Post Acute Care Choice:    Choice offered to:     DME Arranged:    DME Agency:     HH Arranged:    [facility] Agency:     Status of Service:  In process, will continue to follow  If discussed at Long Length of Stay Meetings, dates discussed:    Additional Comments:  Reinaldo Raddle, RN, BSN  Trauma/Neuro ICU Case Manager [phone]

## 2016-12-22 NOTE — Progress Notes (Signed)
[facility] ROUNDING NOTE   Subjective:   Interval History: Acute hemorrhagic stroke with left hemiparesis. Plan dialysis today will discontinue Peritoneal dialysis for now   Objective:  Vital signs in last 24 hours:  Temp:  [97.2 F (36.2 C)-98.3 F (36.8 C)] 97.7 F (36.5 C) (06/19 0400) Pulse Rate:  [52-83] 54 (06/19 0700) Resp:  [11-18] 13 (06/19 0700) BP: (100-175)/(58-100) 143/88 (06/19 0700) SpO2:  [92 %-99 %] 94 % (06/19 0700) Weight:  [123 lb 0.3 oz (55.8 kg)] 123 lb 0.3 oz (55.8 kg) (06/19 0220)  Weight change: 2 lb 10.3 oz (1.2 kg) Filed Weights   12/20/16 0150 12/21/16 0100 12/22/16 0220  Weight: 120 lb 13 oz (54.8 kg) 120 lb 5.9 oz (54.6 kg) 123 lb 0.3 oz (55.8 kg)    Intake/Output: I/O last 3 completed shifts: In: 10351.6 [P.O.:220; I.V.:124.6; Other:10007] Out: 83419 [Urine:410; Other:12104]   Intake/Output this shift:  No intake/output data recorded.  CVS- RRR RS- CTA ABD- BS present soft non-distended EXT- no edema   Left hemiparesis    Basic Metabolic Panel:  Recent Labs Lab 12/19/16 2101 12/19/16 2344 12/20/16 0014 12/21/16 0305 12/22/16 0135  NA 138 138 138 140 134*  K 4.3 5.1 5.2* 4.5 6.2*  CL 103 104 103 104 103  CO2 18* 15* 15* 18* 15*  GLUCOSE 98 116* 111* 98 95  BUN 90* 98* 100* 77* 85*  CREATININE 13.31* 13.54* 13.35* 12.54* 13.36*  CALCIUM 8.5* 8.7* 8.8* 8.7* 8.7*  MG  --   --  2.5*  --   --   PHOS  --   --  7.4*  --   --     Liver Function Tests:  Recent Labs Lab 12/19/16 1648  AST 82*  ALT 21  ALKPHOS 43  BILITOT 0.9  PROT 6.4*  ALBUMIN 3.6   No results for input(s): LIPASE, AMYLASE in the last 168 hours. No results for input(s): AMMONIA in the last 168 hours.  CBC:  Recent Labs Lab 12/19/16 1648  12/19/16 1851 12/20/16 0140 12/20/16 0847 12/21/16 0305 12/22/16 0135  WBC 9.1  --  QUESTIONABLE RESULTS, RECOMMEND RECOLLECT TO VERIFY 10.8* 9.1 9.3 8.8  NEUTROABS 6.6  --  3.6 9.0*  --   --    --   HGB 18.7*  < > QUESTIONABLE RESULTS, RECOMMEND RECOLLECT TO VERIFY 17.9* 17.7* 19.1* 18.0*  HCT 58.6*  < > 29.7* 56.9* 55.7* 59.2* 56.3*  MCV 104.3*  --  113.4* 102.9* 102.0* 103.3* 102.7*  PLT 239  --  QUESTIONABLE RESULTS, RECOMMEND RECOLLECT TO VERIFY 236 208 215 259  < > = values in this interval not displayed.  Cardiac Enzymes:  Recent Labs Lab 12/19/16 2101  TROPONINI 1.21*    BNP: Invalid input(s): POCBNP  CBG:  Recent Labs Lab 12/19/16 1644  GLUCAP 119*    Microbiology: Results for orders placed or performed during the hospital encounter of 12/19/16  MRSA PCR Screening     Status: None   Collection Time: 12/20/16  1:50 AM  Result Value Ref Range Status   MRSA by PCR NEGATIVE NEGATIVE Final    Comment:        The GeneXpert MRSA Assay (FDA approved for NASAL specimens only), is one component of a comprehensive MRSA colonization surveillance program. It is not intended to diagnose MRSA infection nor to guide or monitor treatment for MRSA infections.     Coagulation Studies:  Recent Labs  12/19/16 1818 12/19/16 2020 12/21/16 1336  LABPROT  12.9 QUESTIONABLE RESULTS, RECOMMEND RECOLLECT TO VERIFY 12.4  INR 0.97 NOT CALCULATED 0.93    Urinalysis: No results for input(s): COLORURINE, LABSPEC, PHURINE, GLUCOSEU, HGBUR, BILIRUBINUR, KETONESUR, PROTEINUR, UROBILINOGEN, NITRITE, LEUKOCYTESUR in the last 72 hours.  Invalid input(s): APPERANCEUR    Imaging: Ct Head Wo Contrast  Result Date: 12/22/2016 CLINICAL DATA:  Follow-up examination for acute intracranial hemorrhage. EXAM: CT HEAD WITHOUT CONTRAST TECHNIQUE: Contiguous axial images were obtained from the base of the skull through the vertex without intravenous contrast. COMPARISON:  Prior CT from 12/21/2016. FINDINGS: Brain: Intraparenchymal hemorrhage centered at the high right frontal parietal region again seen. Hemorrhage overall not significantly changed measuring 4.1 x 3.3 x 5.4 cm seen  greatest dimensions (previously 4.1 x 3.4 x 5.5 cm). Surrounding low-density vasogenic edema relatively with sulcal effacement relatively similar. Mass effect on the right lateral ventricle which is partially effaced relatively unchanged. Right-to-left midline shift measures approximately 3 mm, similar. No hydrocephalus or ventricular trapping. No intraventricular extension of hemorrhage. Adjacent small volume subarachnoid hemorrhage noted, similar. Adjacent extra-axial hemorrhage unchanged. No new acute intracranial hemorrhage. No evidence for acute large vessel territory infarct. Vascular: No hyperdense vessel. Scattered vascular calcifications noted within the carotid siphons. Skull: Scalp soft tissues and calvarium unchanged, and remain within normal limits. Sinuses/Orbits: Globes and orbital soft tissues within normal limits. Paranasal sinuses and mastoid air cells remain clear. IMPRESSION: 1. No significant interval change in posterior right frontal intraparenchymal hematoma with extra-axial extension. Similar localized edema and mass effect with 3 mm right-to-left shift. 2. No other new acute intracranial process. Electronically Signed   By: Jeannine Boga M.D.   On: 12/22/2016 04:49   Ct Head Wo Contrast  Result Date: 12/21/2016 CLINICAL DATA:  Intracranial hemorrhage. EXAM: CT HEAD WITHOUT CONTRAST TECHNIQUE: Contiguous axial images were obtained from the base of the skull through the vertex without intravenous contrast. COMPARISON:  CT head without contrast 12/20/2016 and 12/19/2016. FINDINGS: Brain: The focal hyperdense hemorrhage within the high right frontal lobe extends to the cortex. The area of hemorrhage continues to slowly increase in size. Maximal dimensions on coronal and sagittal reformats are now 4.1 x 3.4 by 5.5 cm. This compares with previous measurements of 3.8 x 8 3.3 x 5.2 cm and is significantly larger than the regional measurements. Adjacent extra-axial hemorrhage is similar  the prior study. Mild edema surrounds the hemorrhage. Local mass effect is present with effacement of the sulci. Minimal midline shift is present. There is increasing effacement of the right lateral ventricle. Vascular: Atherosclerotic calcifications are present within the cavernous internal carotid arteries. There is no hyperdense vessel. Skull: The calvarium is intact. No focal lytic or blastic lesions are present. Sinuses/Orbits: The paranasal sinuses and mastoid air cells are clear. The globes and orbits are unremarkable. IMPRESSION: 1. Continued increase in size of right frontal parenchymal hemorrhage with extra-axial extension. 2. Slight increase in associated mass effect. These results were called by telephone at the time of interpretation on 12/21/2016 at 9:21 am to Dr. Rosalin Hawking , who verbally acknowledged these results. Electronically Signed   By: San Morelle M.D.   On: 12/21/2016 09:29   Dg Swallowing Func-speech Pathology  Result Date: 12/20/2016 Objective Swallowing Evaluation: Type of Study: MBS-Modified Barium Swallow Study Patient Details Name: Terry Juarez MRN: [id number] Date of Birth: 12-06-1965 Today's Date: 12/20/2016 Time: SLP Start Time (ACUTE ONLY): 0914-SLP Stop Time (ACUTE ONLY): 5625 SLP Time Calculation (min) (ACUTE ONLY): 13 min Past Medical History: Past Medical History: Diagnosis Date . Anemia  .  Bruises easily  . Dialysis patient ([place])  . ESRD on dialysis ([place])  . Hyperlipidemia  . Hypertension  . Renal disorder   rt renal mass / < functioning of left kidney - being prepared for possible dialysis . Right renal mass  Past Surgical History: Past Surgical History: Procedure Laterality Date . AV FISTULA PLACEMENT Right 07/11/2014  Procedure: ARTERIOVENOUS (AV) FISTULA CREATION RIGHT ARM BRACHIO-CEPHALIC WITH ATTEMPTED RADIO-CEPHALIC (AV) FISTULA;  Surgeon: Mal Misty, MD;  Location: [place];  Service: Vascular;  Laterality: Right; . COLONOSCOPY WITH PROPOFOL N/A 09/04/2016   Procedure: COLONOSCOPY WITH PROPOFOL;  Surgeon: Carol Ada, MD;  Location: [facility] END[facility]COPY;  Service: Endoscopy;  Laterality: N/A; . ECTOPIC PREGNANCY SURGERY  1987 . ESOPHAGOGASTRODUODENOSCOPY N/A 07/30/2016  Procedure: ESOPHAGOGASTRODUODENOSCOPY (EGD);  Surgeon: Carol Ada, MD;  Location[facility] ENDOSCOPY;  Service: Endoscopy;  Laterality: N/A;  Bedside . ESOPHAGOGASTRODUODENOSCOPY (EGD) WITH PROPOFOL N/A 09/04/2016  Procedure: ESOPHAGOGASTRODUODENOSCOPY (EGD) WITH PROPOFOL;  Surgeon: Carol Ada, MD;  Location: [facility] ENDOSCOPY;  Service: Endoscopy;  Laterality: N/A; . INSERTION OF DIALYSIS CATHETER Right 07/11/2014  Procedure: INSERTION OF DIALYSIS CATHETER IN RIGHT INTERNAL JUGULAR ;  Surgeon: Mal Misty, MD;  Location: [place];  Service: Vascular;  Laterality: Right; . LAPAROSCOPIC NEPHRECTOMY Right 07/25/2014  Procedure: RIGHT LAPAROSCOPIC RADICAL NEPHRECTOMY ;  Surgeon: Ardis Hughs, MD;  Location: [facility];  Service: Urology;  Laterality: Right; . PATCH ANGIOPLASTY Right 07/11/2014  Procedure: PATCH ANGIOPLASTY OF RIGHT RADIAL ARTERY USING CEPHALIC VEIN.;  Surgeon: Mal Misty, MD;  Location: [place];  Service: Vascular;  Laterality: Right; . THROMBECTOMY W/ EMBOLECTOMY Right 07/11/2014  Procedure: THROMBECTOMY OF RIGHT RADIAL ARTERY  ;  Surgeon: Mal Misty, MD;  Location: [facility] OR;  Service: Vascular;  Laterality: Right; HPI: 51 yo F with history of ESRD on Peritoneal Dialysis, HTN, Right renal mass, and Anemia, who was found down on the floor seizing. She was found to have a large right frontal hemorrhage with surrounding edema and mass effect, also with small SAH and signs of PRES. Pt describes subtle dysphagia and vocal quality changes following thyroid surgery in April. Subjective: pt alert, eager for POs, particularly before she starts dialysis Assessment / Plan / Recommendation CHL IP CLINICAL IMPRESSIONS 12/20/2016 Clinical Impression Pt's oropharyngeal swallow appears to be [facility], with no aspiration or  penetration observed. Of note, she did not appear to have as many audible swallows or throat clearings during MBS as was observed at bedside, but when she did have throat clearing it was no associated with any airway compromise. She fairly consistently completed second swallows, particularly with thin liquids, but there were only trace residuals (mostly a small coating of the tongue/base of tongue) remaining throughout her oropharynx. Recommend that pt initiate regular textures and thin liquids. SLP f/u not needed for swallowing, but recommend MD order SLP cognitive-linguistic evaluation. SLP Visit Diagnosis Dysphagia, unspecified (R13.10) Attention and concentration deficit following -- Frontal lobe and executive function deficit following -- Impact on safety and function Mild aspiration risk   CHL IP TREATMENT RECOMMENDATION 12/20/2016 Treatment Recommendations No treatment recommended at this time   Prognosis 12/20/2016 Prognosis for Safe Diet Advancement Good Barriers to Reach Goals -- Barriers/Prognosis Comment -- CHL IP DIET RECOMMENDATION 12/20/2016 SLP Diet Recommendations Regular solids;Thin liquid Liquid Administration via Cup;Straw Medication Administration Whole meds with liquid Compensations Slow rate;Small sips/bites Postural Changes Remain semi-upright after after feeds/meals (Comment);Seated upright at 90 degrees   CHL IP OTHER RECOMMENDATIONS 12/20/2016 Recommended Consults -- Oral Care Recommendations Oral  care BID Other Recommendations --   CHL IP FOLLOW UP RECOMMENDATIONS 12/20/2016 Follow up Recommendations Other (comment)   No flowsheet data found.     CHL IP ORAL PHASE 12/20/2016 Oral Phase WFL Oral - Pudding Teaspoon -- Oral - Pudding Cup -- Oral - Honey Teaspoon -- Oral - Honey Cup -- Oral - Nectar Teaspoon -- Oral - Nectar Cup -- Oral - Nectar Straw -- Oral - Thin Teaspoon -- Oral - Thin Cup -- Oral - Thin Straw -- Oral - Puree -- Oral - Mech Soft -- Oral - Regular -- Oral - Multi-Consistency --  Oral - Pill -- Oral Phase - Comment --  CHL IP PHARYNGEAL PHASE 12/20/2016 Pharyngeal Phase WFL Pharyngeal- Pudding Teaspoon -- Pharyngeal -- Pharyngeal- Pudding Cup -- Pharyngeal -- Pharyngeal- Honey Teaspoon -- Pharyngeal -- Pharyngeal- Honey Cup -- Pharyngeal -- Pharyngeal- Nectar Teaspoon -- Pharyngeal -- Pharyngeal- Nectar Cup -- Pharyngeal -- Pharyngeal- Nectar Straw -- Pharyngeal -- Pharyngeal- Thin Teaspoon -- Pharyngeal -- Pharyngeal- Thin Cup -- Pharyngeal -- Pharyngeal- Thin Straw -- Pharyngeal -- Pharyngeal- Puree -- Pharyngeal -- Pharyngeal- Mechanical Soft -- Pharyngeal -- Pharyngeal- Regular -- Pharyngeal -- Pharyngeal- Multi-consistency -- Pharyngeal -- Pharyngeal- Pill -- Pharyngeal -- Pharyngeal Comment --  CHL IP CERVICAL ESOPHAGEAL PHASE 12/20/2016 Cervical Esophageal Phase WFL Pudding Teaspoon -- Pudding Cup -- Honey Teaspoon -- Honey Cup -- Nectar Teaspoon -- Nectar Cup -- Nectar Straw -- Thin Teaspoon -- Thin Cup -- Thin Straw -- Puree -- Mechanical Soft -- Regular -- Multi-consistency -- Pill -- Cervical Esophageal Comment -- No flowsheet data found. Germain Osgood 12/20/2016, 9:44 AM  Germain Osgood, M.A. CCC-SLP [phone]               Medications:   . sodium chloride    . clevidipine 4 mg/hr (12/22/16 0700)  . dialysis solution 2.5% low-MG/low-CA     . amLODipine  10 mg Oral Daily  . carvedilol  12.5 mg Oral BID WC  . cloNIDine  0.1 mg Oral TID  . famotidine  20 mg Oral Daily  . gentamicin cream  1 application Topical Daily  . levETIRAcetam  500 mg Oral BID  . losartan  100 mg Oral QHS  . multivitamin  1 tablet Oral Daily  . pantoprazole  40 mg Oral BID  . senna-docusate  1 tablet Oral BID   sodium chloride, dianeal solution for CAPD/CCPD with heparin, hydrALAZINE, HYDROmorphone (DILAUDID) injection, labetalol  Assessment/ Plan:  1 Acute R hemorrhagic stroke w left hemiparesis - per CCM/ nsurg 2 HTN'sive emergency w PRES on MRI - BP very labile will reduce  volume removal on dialysis. She continues on IV clevipine  3 ESRD on CCPD - no gross vol excess 4 Hist renal cell cancer, sp R nephrectomy 5 Anemia of CKD - labs conflicting 6 Volume no vol excess 7 Hyperkalemia -  Hemodialysis 6/19  Initiated and will discontinue the peritoneal dialysis to make management easier. Will need to coordinate with clip   Procedure  Patient seen on dialysis       LOS: 3 Amadi Yoshino W @TODAY @7 :37 AM

## 2016-12-22 NOTE — Progress Notes (Signed)
STROKE TEAM PROGRESS NOTE   SUBJECTIVE (INTERVAL HISTORY) Patient to HD this am where her BP increased and she became increasingly unresponsive. Upon return to ICU, pt was lethargic but improved, can state name and follow commands with much prompting. They had hooked her up to HD when sx started - BP down first then up. Check CT and EKG.  OBJECTIVE Temp:  [97.2 F (36.2 C)-98.3 F (36.8 C)] 98 F (36.7 C) (06/19 0720) Pulse Rate:  [52-83] 69 (06/19 0830) Cardiac Rhythm: Normal sinus rhythm (06/19 0400) Resp:  [11-21] 16 (06/19 0830) BP: (100-180)/(58-100) 180/74 (06/19 0830) SpO2:  [92 %-99 %] 96 % (06/19 0830) Weight:  [55.8 kg (123 lb 0.3 oz)-56.3 kg (124 lb 1.9 oz)] 56.3 kg (124 lb 1.9 oz) (06/19 0720)  CBC:   Recent Labs Lab 12/19/16 1851 12/20/16 0140  12/21/16 0305 12/22/16 0135  WBC QUESTIONABLE RESULTS, RECOMMEND RECOLLECT TO VERIFY 10.8*  < > 9.3 8.8  NEUTROABS 3.6 9.0*  --   --   --   HGB QUESTIONABLE RESULTS, RECOMMEND RECOLLECT TO VERIFY 17.9*  < > 19.1* 18.0*  HCT 29.7* 56.9*  < > 59.2* 56.3*  MCV 113.4* 102.9*  < > 103.3* 102.7*  PLT QUESTIONABLE RESULTS, RECOMMEND RECOLLECT TO VERIFY 236  < > 215 259  < > = values in this interval not displayed.  Basic Metabolic Panel:   Recent Labs Lab 12/20/16 0014 12/21/16 0305 12/22/16 0135  NA 138 140 134*  K 5.2* 4.5 6.2*  CL 103 104 103  CO2 15* 18* 15*  GLUCOSE 111* 98 95  BUN 100* 77* 85*  CREATININE 13.35* 12.54* 13.36*  CALCIUM 8.8* 8.7* 8.7*  MG 2.5*  --   --   PHOS 7.4*  --   --     Lipid Panel:     Component Value Date/Time   CHOL 214 (H) 02/12/2015 0511   TRIG 42 02/12/2015 0511   HDL 83 02/12/2015 0511   CHOLHDL 2.6 02/12/2015 0511   VLDL 8 02/12/2015 0511   LDLCALC 123 (H) 02/12/2015 0511   HgbA1c: No results found for: HGBA1C Urine Drug Screen:     Component Value Date/Time   LABOPIA NONE DETECTED 12/19/2016 2051   COCAINSCRNUR NONE DETECTED 12/19/2016 2051   LABBENZ POSITIVE (A)  12/19/2016 2051   AMPHETMU NONE DETECTED 12/19/2016 2051   THCU NONE DETECTED 12/19/2016 2051   LABBARB NONE DETECTED 12/19/2016 2051    Alcohol Level No results found for: [place] I have personally reviewed the radiological images below and agree with the radiology interpretations.  Ct Head Wo Contrast 12/22/2016 IMPRESSION: Stable intraparenchymal hemorrhage. Subtle signs of midline shift, and slight increased pressure. But no significant worsening from priors.    12/22/2016 IMPRESSION: 1. No significant interval change in posterior right frontal intraparenchymal hematoma with extra-axial extension. Similar localized edema and mass effect with 3 mm right-to-left shift. 2. No other new acute intracranial process.   12/21/2016 1. Continued increase in size of right frontal parenchymal hemorrhage with extra-axial extension. 2. Slight increase in associated mass effect. 12/20/2016 1. Slightly increased size of right frontal intraparenchymal hematoma with unchanged right convexity subarachnoid blood, compared to head CTA performed 12/19/2016. Compared to the initial head CT from this presentation, obtained at 5:02 p.m. on 12/19/2016, the hematoma has increased in size.  2. No new hemorrhage, hydrocephalus or mass effect.  12/19/2016 1. Right posterior frontal lobe acute hemorrhage measuring up to 29 mm with small area of surrounding edema and  local mass effect. No significant midline shift or herniation. Follow-up to ensure resolution is recommended to exclude an underlying mass.  2. Small volume of subarachnoid hemorrhage overlying the right frontal convexity.  3. Hypoattenuation within subcortical white matter and bilateral parietal and occipital lobes. Findings are suggestive of PRES in the setting of hypertension. This can be further characterized with MRI of the brain.   Ct Angio Head W Or Wo Contrast Ct Angio Neck W And/or Wo Contrast 12/19/2016 1. No vascular explanation for the  right frontal parietal hematoma. Dural venous sinuses are patent and there is no evidence of vascular malformation.  2. The 16 cc hematoma is mildly increased from earlier CT. Negative for shift or herniation.  3. Fibromuscular dysplasia of the bilateral cervical ICA.  4. Moderate atherosclerosis for age. Negative for flow limiting stenosis.    Mr Brain Wo Contrast 12/19/2016 1. Findings of posterior reversible encephalopathy syndrome.  2. Size stable posterior right frontal hematoma compared to CTA 1 hour prior.   Ct Cervical Spine Wo Contrast 12/19/2016 No acute fracture or dislocation of the cervical spine.   Dg Swallowing Func-speech Pathology 12/20/2016 SLP Diet Recommendations Regular solids;Thin liquid Liquid Administration via Cup;Straw Medication Administration Whole meds with liquid.         EEG 12/21/2016 This awake and asleep EEG is abnormal due to diffuse slowing of the waking background.   PHYSICAL EXAM  Temp:  [97.2 F (36.2 C)-98.3 F (36.8 C)] 98 F (36.7 C) (06/19 0720) Pulse Rate:  [52-83] 69 (06/19 0830) Resp:  [11-21] 16 (06/19 0830) BP: (100-180)/(58-100) 180/74 (06/19 0830) SpO2:  [92 %-99 %] 96 % (06/19 0830) Weight:  [55.8 kg (123 lb 0.3 oz)-56.3 kg (124 lb 1.9 oz)] 56.3 kg (124 lb 1.9 oz) (06/19 0720)  General - Well nourished, well developed, lethargic, lying in bed after coming back from dialysis  Ophthalmologic - Fundi not visualized due to noncooperation.  Cardiovascular - Regular rate and rhythm.  Neuro - very lethargic but able to cooperate with exam, orientated to time, people and place, self and age. Hypophonia but no aphasia, follows commands, right gaze preference, barely cross midline, PERRL, blinking to visual threat on the right, left facial droop, tongue midline. LUE and LLE withdraw on pain stimulation. RUE and RLE spontaneous movement, 3/5. Sensation symmetrical, not cooperative on coordination test, gait not tested.     ASSESSMENT/PLAN Terry Juarez is a 51 y.o. female with history of a renal cell carcinoma, hypertension, end-stage renal disease on peritoneal dialysis, hyperlipidemia, and anemia presenting with seizure, left-sided weakness, and right frontal headache. She did not receive IV t-PA due to [place].  ICH: right frontal ICH with SAH in setting of hypertension with increase in hemorrhage size and MRI evidence of PRES.   Resultant left hemiplegia and left facial droop  CT head -  Right posterior frontal lobe acute hemorrhage measuring up to 29 mm with small area of surrounding edema and local mass effect.  MRI head - Findings of PRES.  CTA - No vascular explanation for the right frontal parietal hematoma. Fibromuscular dysplasia of the bilateral cervical ICA.   Repeat CT head x 2 showed slowing increase in hemorrhage size  Repeat CT x 2 without significant change  2D Echo - 07/30/2016 - EF 60-65%. No cardiac source of emboli identified.  EEG - diffuse slowing  LDL - not indicated  HgbA1c - not indicated  VTE prophylaxis - SCDs Diet renal with fluid restriction Fluid restriction: 1200 mL  Fluid; Room service appropriate? Yes; Fluid consistency: Thin  No antithrombotic prior to admission, now on No antithrombotic due to hemorrhage  Ongoing aggressive stroke risk factor management  Therapy recommendations:  CIR  Disposition: Pending  Encephalopathy episode  Occurred in HD after hooked up  BP initially dropped then went up  Pt became responsive  Back to ICU for planned intubation  Upon return, pt lethargic but will follow commands and state her name. Intubation held  Repeat CT stable  BP stable now  Hypertensive Emergency  BP 178/115 on admission, up to 198/117,  elevated   worsening hemorrhage and cerebral edema over the weekend  Decreased SBP goal to < 140 6/18 given increase in hematoma size.  cleviprex added for BP control  Continue norvasc, coreg,  catapress.  Nephrology on board   Long-term BP goal normotensive  PRES  MRI confirmed PRES  Likely related to ESRD and hypertension  BP goal < 140 due to increased hematoma  Close BP monitoring  ESRD on PD  Nephrology on board  Previously on PD  Attempted HD this am  Hyperlipidemia  Listed on hx  Not on statin PTA  Not a statin candidate at this time given hemorrhage  Seizure on admission  Started On Keppra   No further seizure  EEG diffuse slowing  Other Active Problems  Hx renal cell cancer s/p R nephrectomy  Hyperkalemia - nephrology on board.  Anemia of CKD  TSH 15.736 - need synthroid supplement  Hospital day # 3  This patient is critically ill due to right frontal ICH with SAH, PRES, ESRD on PD, hypertensive emergency and at significant risk of neurological worsening, death form hematoma expansion, status epilepticus, encephalopathy, renal failure, hyperkalemia. This patient's care requires constant monitoring of vital signs, hemodynamics, respiratory and cardiac monitoring, review of multiple databases, neurological assessment, discussion with family, other specialists and medical decision making of high complexity. I spent 40 minutes of neurocritical care time in the care of this patient. I had long discussion with mother in law at bedside, updated pt current condition, treatment plan and potential prognosis.   Rosalin Hawking, MD PhD Stroke Neurology 12/22/2016 10:57 PM    To contact Stroke Continuity provider, please refer to [email or web address] After hours, contact General Neurology

## 2016-12-22 NOTE — Progress Notes (Signed)
Stopped by to visit w/ pt's mother-in-law, who was still faithfully working on her needlework on Bible verses and religious themes. She's understanding today that pt is not improving, and asked about social arrangements, saying pt's father (her closest relative) is likely not able to make decisions for her.   We asked nurse about a possible consult for a social worker to come by and speak w/ her. The case manager has been asked about this. Pt's mother-in-law asked me to keep them in prayer. She had high compliments (today and yesterday) for all caregivers here -- said she'd not encountered such nice ppl in hospitals in all her 36 yrs. Chaplain available for f/u.   12/22/16 1200  Clinical Encounter Type  Visited With Patient and family together  Visit Type Follow-up;Psychological support;Spiritual support;Social support;Critical Care  Referral From Chaplain  Spiritual Encounters  Spiritual Needs Emotional  Stress Factors  Patient Stress Factors Health changes;Loss of control  Family Stress Factors Family relationships;Health changes;Loss of control;Major life changes   Gerrit Heck, Chaplain

## 2016-12-22 NOTE — Progress Notes (Signed)
Patient was given kayexalate PO per CCMs order. She threw up most of it and was unable to keep medication down. Nephrology notified of K level and no new interventions, plan is to dialyze patient in AM. Will continue to monitor.

## 2016-12-23 DIAGNOSIS — Z85528 Personal history of other malignant neoplasm of kidney: Secondary | ICD-10-CM

## 2016-12-23 DIAGNOSIS — I611 Nontraumatic intracerebral hemorrhage in hemisphere, cortical: Principal | ICD-10-CM

## 2016-12-23 DIAGNOSIS — Z992 Dependence on renal dialysis: Secondary | ICD-10-CM

## 2016-12-23 DIAGNOSIS — I10 Essential (primary) hypertension: Secondary | ICD-10-CM

## 2016-12-23 DIAGNOSIS — R748 Abnormal levels of other serum enzymes: Secondary | ICD-10-CM

## 2016-12-23 DIAGNOSIS — G40909 Epilepsy, unspecified, not intractable, without status epilepticus: Secondary | ICD-10-CM

## 2016-12-23 DIAGNOSIS — I619 Nontraumatic intracerebral hemorrhage, unspecified: Secondary | ICD-10-CM | POA: Diagnosis present

## 2016-12-23 DIAGNOSIS — N186 End stage renal disease: Secondary | ICD-10-CM

## 2016-12-23 DIAGNOSIS — I6783 Posterior reversible encephalopathy syndrome: Secondary | ICD-10-CM | POA: Diagnosis present

## 2016-12-23 LAB — BASIC METABOLIC PANEL
ANION GAP: 16 — AB (ref 5–15)
BUN: 71 mg/dL — ABNORMAL HIGH (ref 6–20)
CO2: 21 mmol/L — AB (ref 22–32)
Calcium: 8.5 mg/dL — ABNORMAL LOW (ref 8.9–10.3)
Chloride: 100 mmol/L — ABNORMAL LOW (ref 101–111)
Creatinine, Ser: 12.52 mg/dL — ABNORMAL HIGH (ref 0.44–1.00)
GFR calc non Af Amer: 3 mL/min — ABNORMAL LOW (ref >60)
GFR, EST AFRICAN AMERICAN: 3 mL/min — AB (ref >60)
GLUCOSE: 78 mg/dL (ref 65–99)
Potassium: 5.2 mmol/L — ABNORMAL HIGH (ref 3.5–5.1)
Sodium: 137 mmol/L (ref 135–145)

## 2016-12-23 LAB — CBC
HEMATOCRIT: 53.4 % — AB (ref 36.0–46.0)
HEMOGLOBIN: 17.4 g/dL — AB (ref 12.0–15.0)
MCH: 33 pg (ref 26.0–34.0)
MCHC: 32.6 g/dL (ref 30.0–36.0)
MCV: 101.1 fL — ABNORMAL HIGH (ref 78.0–100.0)
Platelets: 247 10*3/uL (ref 150–400)
RBC: 5.28 MIL/uL — AB (ref 3.87–5.11)
RDW: 15.3 % (ref 11.5–15.5)
WBC: 7.6 10*3/uL (ref 4.0–10.5)

## 2016-12-23 LAB — HEPATITIS B SURFACE ANTIBODY,QUALITATIVE: Hep B S Ab: REACTIVE

## 2016-12-23 LAB — HEPATITIS B SURFACE ANTIGEN: Hepatitis B Surface Ag: NEGATIVE

## 2016-12-23 LAB — HEPATITIS B CORE ANTIBODY, TOTAL: HEP B C TOTAL AB: NEGATIVE

## 2016-12-23 LAB — TRIGLYCERIDES: Triglycerides: 438 mg/dL — ABNORMAL HIGH (ref <150)

## 2016-12-23 MED ORDER — LEVOTHYROXINE SODIUM 88 MCG PO TABS
88.0000 ug | ORAL_TABLET | Freq: Every day | ORAL | Status: DC
Start: 2016-12-24 — End: 2016-12-24
  Administered 2016-12-24: 88 ug via ORAL
  Filled 2016-12-23: qty 1

## 2016-12-23 MED ORDER — HYDRALAZINE HCL 20 MG/ML IJ SOLN
10.0000 mg | INTRAMUSCULAR | Status: DC | PRN
Start: 2016-12-23 — End: 2017-01-01
  Administered 2016-12-23: 10 mg via INTRAVENOUS
  Administered 2016-12-24: 20 mg via INTRAVENOUS
  Administered 2016-12-24 (×2): 10 mg via INTRAVENOUS
  Administered 2016-12-25 – 2016-12-27 (×6): 20 mg via INTRAVENOUS
  Filled 2016-12-23 (×10): qty 1

## 2016-12-23 MED ORDER — HYDRALAZINE HCL 20 MG/ML IJ SOLN: 10.0000 mg | INTRAMUSCULAR | Status: DC | PRN

## 2016-12-23 NOTE — Progress Notes (Signed)
STROKE TEAM PROGRESS NOTE   SUBJECTIVE (INTERVAL HISTORY) Patient drowsy, sitting in chair, more awake alert than yesterday, opens eyes to voice, follows commands, and is conversant.  OBJECTIVE Temp:  [97.4 F (36.3 C)-98.4 F (36.9 C)] 98 F (36.7 C) (06/20 1200) Pulse Rate:  [52-85] 58 (06/20 1500) Cardiac Rhythm: Normal sinus rhythm (06/20 0800) Resp:  [11-19] 16 (06/20 1500) BP: (110-154)/(61-122) 132/86 (06/20 1500) SpO2:  [94 %-99 %] 97 % (06/20 1500)  CBC:   Recent Labs Lab 12/19/16 1851 12/20/16 0140  12/22/16 1048 12/23/16 0253  WBC QUESTIONABLE RESULTS, RECOMMEND RECOLLECT TO VERIFY 10.8*  < > 12.6* 7.6  NEUTROABS 3.6 9.0*  --   --   --   HGB QUESTIONABLE RESULTS, RECOMMEND RECOLLECT TO VERIFY 17.9*  < > 18.1* 17.4*  HCT 29.7* 56.9*  < > 52.9* 53.4*  MCV 113.4* 102.9*  < > 100.4* 101.1*  PLT QUESTIONABLE RESULTS, RECOMMEND RECOLLECT TO VERIFY 236  < > 293 247  < > = values in this interval not displayed.  Basic Metabolic Panel:   Recent Labs Lab 12/20/16 0014  12/22/16 1048 12/23/16 0253  NA 138  < > 134* 137  K 5.2*  < > 5.1 5.2*  CL 103  < > 102 100*  CO2 15*  < > 18* 21*  GLUCOSE 111*  < > 167* 78  BUN 100*  < > 62* 71*  CREATININE 13.35*  < > 11.42* 12.52*  CALCIUM 8.8*  < > 8.9 8.5*  MG 2.5*  --   --   --   PHOS 7.4*  --   --   --   < > = values in this interval not displayed.  Lipid Panel:     Component Value Date/Time   CHOL 214 (H) 02/12/2015 0511   TRIG 438 (H) 12/23/2016 0253   HDL 83 02/12/2015 0511   CHOLHDL 2.6 02/12/2015 0511   VLDL 8 02/12/2015 0511   LDLCALC 123 (H) 02/12/2015 0511   HgbA1c: No results found for: HGBA1C Urine Drug Screen:     Component Value Date/Time   LABOPIA NONE DETECTED 12/19/2016 2051   COCAINSCRNUR NONE DETECTED 12/19/2016 2051   LABBENZ POSITIVE (A) 12/19/2016 2051   AMPHETMU NONE DETECTED 12/19/2016 2051   THCU NONE DETECTED 12/19/2016 2051   LABBARB NONE DETECTED 12/19/2016 2051    Alcohol  Level No results found for: [place] I have personally reviewed the radiological images below and agree with the radiology interpretations.  Ct Head Wo Contrast 12/22/2016 IMPRESSION: Stable intraparenchymal hemorrhage. Subtle signs of midline shift, and slight increased pressure. But no significant worsening from priors.    12/22/2016 IMPRESSION: 1. No significant interval change in posterior right frontal intraparenchymal hematoma with extra-axial extension. Similar localized edema and mass effect with 3 mm right-to-left shift. 2. No other new acute intracranial process.   12/21/2016 1. Continued increase in size of right frontal parenchymal hemorrhage with extra-axial extension. 2. Slight increase in associated mass effect. 12/20/2016 1. Slightly increased size of right frontal intraparenchymal hematoma with unchanged right convexity subarachnoid blood, compared to head CTA performed 12/19/2016. Compared to the initial head CT from this presentation, obtained at 5:02 p.m. on 12/19/2016, the hematoma has increased in size.  2. No new hemorrhage, hydrocephalus or mass effect.  12/19/2016 1. Right posterior frontal lobe acute hemorrhage measuring up to 29 mm with small area of surrounding edema and local mass effect. No significant midline shift or herniation. Follow-up to ensure resolution is  recommended to exclude an underlying mass.  2. Small volume of subarachnoid hemorrhage overlying the right frontal convexity.  3. Hypoattenuation within subcortical white matter and bilateral parietal and occipital lobes. Findings are suggestive of PRES in the setting of hypertension. This can be further characterized with MRI of the brain.   Ct Angio Head W Or Wo Contrast Ct Angio Neck W And/or Wo Contrast 12/19/2016 1. No vascular explanation for the right frontal parietal hematoma. Dural venous sinuses are patent and there is no evidence of vascular malformation.  2. The 16 cc hematoma is mildly  increased from earlier CT. Negative for shift or herniation.  3. Fibromuscular dysplasia of the bilateral cervical ICA.  4. Moderate atherosclerosis for age. Negative for flow limiting stenosis.    Mr Brain Wo Contrast 12/19/2016 1. Findings of posterior reversible encephalopathy syndrome.  2. Size stable posterior right frontal hematoma compared to CTA 1 hour prior.   Ct Cervical Spine Wo Contrast 12/19/2016 No acute fracture or dislocation of the cervical spine.   Dg Swallowing Func-speech Pathology 12/20/2016 SLP Diet Recommendations Regular solids;Thin liquid Liquid Administration via Cup;Straw Medication Administration Whole meds with liquid.         EEG 12/21/2016 This awake and asleep EEG is abnormal due to diffuse slowing of the waking background.   PHYSICAL EXAM  Temp:  [97.4 F (36.3 C)-98.4 F (36.9 C)] 98 F (36.7 C) (06/20 1200) Pulse Rate:  [52-85] 58 (06/20 1500) Resp:  [11-19] 16 (06/20 1500) BP: (110-154)/(61-122) 132/86 (06/20 1500) SpO2:  [94 %-99 %] 97 % (06/20 1500)  General - Well nourished, well developed, lethargic, lying in bed after coming back from dialysis  Ophthalmologic - Fundi not visualized due to noncooperation.  Cardiovascular - Regular rate and rhythm.  Neuro - very lethargic but able to cooperate with exam, orientated to time, people and place, self and age. Hypophonia but no aphasia, follows commands, right gaze preference, barely cross midline, PERRL, blinking to visual threat on the right, left facial droop, tongue midline. LUE and LLE withdraw on pain stimulation. RUE and RLE spontaneous movement, 3/5. Sensation symmetrical, not cooperative on coordination test, gait not tested.    ASSESSMENT/PLAN [name] is a 51 y.o. female with history of a renal cell carcinoma, hypertension, end-stage renal disease on peritoneal dialysis, hyperlipidemia, and anemia presenting with seizure, left-sided weakness, and right frontal headache.  She did not receive IV t-PA due to [place].  ICH: right frontal ICH with SAH in setting of hypertension with increase in hemorrhage size and MRI evidence of PRES.   Resultant left hemiplegia and left facial droop  CT head -  Right posterior frontal lobe acute hemorrhage measuring up to 29 mm with small area of surrounding edema and local mass effect.  MRI head - Findings of PRES.  CTA - No vascular explanation for the right frontal parietal hematoma. Fibromuscular dysplasia of the bilateral cervical ICA.   Repeat CT head x 2 showed slowing increase in hemorrhage size  Repeat CT x 2 without significant change  2D Echo - 07/30/2016 - EF 60-65%. No cardiac source of emboli identified.  EEG - diffuse slowing  VTE prophylaxis - SCDs Diet renal with fluid restriction Fluid restriction: 1200 mL Fluid; Room service appropriate? Yes; Fluid consistency: Thin  No antithrombotic prior to admission, now on No antithrombotic due to hemorrhage  Ongoing aggressive stroke risk factor management  Therapy recommendations:  CIR  Disposition: transfer to stepdown  Hypertensive Emergency  BP 178/115 on  admission, up to 198/117,  elevated   worsening hemorrhage and cerebral edema over the weekend  SBP goal to < 140 given increase in hematoma size.  cleviprex added for BP control  Continue norvasc, coreg, catapress.  Nephrology on board   Long-term BP goal normotensive  PRES  MRI confirmed PRES  Likely related to ESRD and hypertension  BP goal < 140 due to increased hematoma  Close BP monitoring  ESRD on PD  Nephrology on board  Previously on PD  Attempted HD 12/22/16 - not tolerating well  Seizure on admission  Started On Keppra   No further seizure  EEG diffuse slowing  Other Active Problems  Hx renal cell cancer s/p R nephrectomy  Hyperkalemia - nephrology on board.  Anemia of CKD  TSH 15.736 - now on synthroid supplement  Hospital day # 4  This patient is  critically ill due to right frontal ICH with SAH, PRES, ESRD on PD, hypertensive emergency and at significant risk of neurological worsening, death form hematoma expansion, status epilepticus, encephalopathy, renal failure, hyperkalemia. This patient's care requires constant monitoring of vital signs, hemodynamics, respiratory and cardiac monitoring, review of multiple databases, neurological assessment, discussion with family, other specialists and medical decision making of high complexity. I spent 35 minutes of neurocritical care time in the care of this patient. I had long discussion with mother in law at bedside, updated pt current condition, treatment plan and potential prognosis.   Terry Hawking, MD PhD Stroke Neurology 12/23/2016 3:47 PM    To contact Stroke Continuity provider, please refer to [email or web address] After hours, contact General Neurology

## 2016-12-23 NOTE — Progress Notes (Signed)
PULMONARY / CRITICAL CARE MEDICINE   Name: Terry Juarez MRN: [id number] DOB: [birth year]    ADMISSION DATE:  12/19/2016 CONSULTATION DATE: Dr Ashok Cordia  CHIEF COMPLAINT: ICH  BRIEF:  50yoF with history of ESRD on Peritoneal Dialysis, HTN, Right renal mass, and Anemia,   Presented to ER on 6/16 c/o acute left-sided weakness and right frontal HA. Head CT showed large 60mm right frontal hemorrhage with surrounding edema and mass effect, also with small SAH and signs of PRES. CTA Head/Neck showed no AVM's but but show the hematoma was mildly increased from the earlier imaging. Neurology saw patient in the ER and started Nicardipine and Keppra. Brain MRI showed no changes in the size of right frontal hematoma compared to CTA; it also confirmed PRES.    SUBJECTIVE:   No events overnight. Remains on low dose Clevidipine gtt     VITAL SIGNS: BP 132/84   Pulse (!) 56   Temp 97.4 F (36.3 C) (Axillary)   Resp 15   Ht 5\' 5"  (1.651 m)   Wt 56.3 kg (124 lb 1.9 oz)   SpO2 95%   BMI 20.65 kg/m   HEMODYNAMICS:  2 PIV's (18G and 20G in LUE) RUE AV fistula   INTAKE / OUTPUT: I/O last 3 completed shifts: In: 649.8 [P.O.:310; I.V.:339.8] Out: 50 [Urine:250]  PHYSICAL EXAMINATION:  General appearance: adult female, no distress Eyes: PERRL, EOMI bilaterally. Mouth:  membranes and no mucosal ulcerations  Neck: Trachea midline  Lungs/chest: CTA, with normal respiratory effort, no wheeze  CV: RRR, no MRGs  Abdomen: Soft, non-tender, non-distended  Extremities: -edema  Skin: warm, dry, intact  Psych: delayed responses, follows commands   LABS:  BMET  Recent Labs Lab 12/22/16 0135 12/22/16 1048 12/23/16 0253  NA 134* 134* 137  K 6.2* 5.1 5.2*  CL 103 102 100*  CO2 15* 18* 21*  BUN 85* 62* 71*  CREATININE 13.36* 11.42* 12.52*  GLUCOSE 95 167* 78   Electrolytes  Recent Labs Lab 12/20/16 0014  12/22/16 0135 12/22/16 1048 12/23/16 0253  CALCIUM 8.8*  < > 8.7* 8.9 8.5*  MG  2.5*  --   --   --   --   PHOS 7.4*  --   --   --   --   < > = values in this interval not displayed. CBC  Recent Labs Lab 12/22/16 0135 12/22/16 1048 12/23/16 0253  WBC 8.8 12.6* 7.6  HGB 18.0* 18.1* 17.4*  HCT 56.3* 52.9* 53.4*  PLT 259 293 247   Coag's  Recent Labs Lab 12/19/16 1818 12/19/16 2020 12/21/16 1336  INR 0.97 NOT CALCULATED 0.93   Sepsis Markers  Recent Labs Lab 12/19/16 1658 12/19/16 2106  LATICACIDVEN 5.86* 1.54   ABG  Recent Labs Lab 12/19/16 2135 12/20/16 0520  PHART 7.261* 7.299*  PCO2ART 41.4 36.0  PO2ART 133.0* 89.9    Liver Enzymes  Recent Labs Lab 12/19/16 1648 12/22/16 0742  AST 82*  --   ALT 21 14  ALKPHOS 43  --   BILITOT 0.9  --   ALBUMIN 3.6  --    Cardiac Enzymes  Recent Labs Lab 12/19/16 2101  TROPONINI 1.21*    Glucose  Recent Labs Lab 12/19/16 1644 12/22/16 0803  GLUCAP 119* 108*    Imaging No results found. LINES/TUBES: Peritoneal dialysis catheter RUE Fistula LUE PIV's   ASSESSMENT / PLAN: 39yoF with history of ESRD on Peritoneal Dialysis, HTN, Right renal mass, and Anemia, who was found down on the  floor seizing today and found to have a large right frontal hemorrhage with surrounding edema and mass effect, also with small SAH and signs of PRES.   ICH/SAH in setting of PRES -CT head 6/19 Stable, MRI with findings of PRES -Seizure on admission  Plan  Per Neurology  Monitor   HTN emergency with PRES -ECHO 07/30/16 EF 60-65%  H/O HLD  Plan Cardiac Monitoring  Clevidipine infusion to maintain systolic < 497 mmHg (currently on 3 mcg will stop and use PRN)  Continue norvasc, coreg and PRN hydralazine  Continue Losartan   ESRD on PD Hyperkalemia  Metabolic acidosis > improving H/O renal cell cancer s/p right nephrectomy  Plan Per Nephrology  Trend BMP  Replace electrolytes as needed   Polycythemia - consistent on serial CBC. Etiology unclear. Hgb down somewhat today Plan  -Trend  CBC -Maintain Hbg > 7   Nutrition  Plan -Renal Diet  -PPI   Hypothyroidism  Plan  -Start home synthroid   FAMILY  - Updates: no family at bedside, patient updated on plan   If we can keep off of clevidipine gtt will transfer to step-down unit later today.   CC Time: 32 minutes   Hayden Pedro, AGACNP-BC [facility]  Pgr: [phone]  PCCM Pgr: [phone]

## 2016-12-23 NOTE — Progress Notes (Signed)
Stopped by to visit w/ pt's faithful mother-in-law, always sitting in the chair by the window by the sun working on her needlework. She said she has hope today, because pt has been better. She just wants to take her home. Provided emotional/spiritual support and prayer. Chaplain available for f/u.   12/23/16 1300  Clinical Encounter Type  Visited With Patient and family together  Visit Type Follow-up;Psychological support;Spiritual support;Social support;Critical Care  Referral From Chaplain  Spiritual Encounters  Spiritual Needs Prayer;Emotional  Stress Factors  Patient Stress Factors Health changes;Loss of control  Family Stress Factors Family relationships;Health changes;Loss of control   Gerrit Heck, Chaplain

## 2016-12-23 NOTE — Progress Notes (Signed)
[facility] ROUNDING NOTE   Subjective:  Acute hemorrhagic stroke with left hemiparesis.  Developed some unreponsiveness and diapheresis on dialysis. Head CT no change in hemorrhage  Has been transitioned to hemodialysis from peritoneal dialysis  Objective:  Vital signs in last 24 hours:  Temp:  [97.4 F (36.3 C)-98.4 F (36.9 C)] 97.4 F (36.3 C) (06/20 0800) Pulse Rate:  [45-85] 64 (06/20 0930) Resp:  [11-17] 16 (06/20 0930) BP: (106-154)/(61-122) 130/69 (06/20 0930) SpO2:  [94 %-100 %] 94 % (06/20 0930)  Weight change: 1 lb 1.6 oz (0.5 kg) Filed Weights   12/21/16 0100 12/22/16 0220 12/22/16 0720  Weight: 120 lb 5.9 oz (54.6 kg) 123 lb 0.3 oz (55.8 kg) 124 lb 1.9 oz (56.3 kg)    Intake/Output: I/O last 3 completed shifts: In: 649.8 [P.O.:310; I.V.:339.8] Out: 2 [Urine:250]   Intake/Output this shift:  Total I/O In: 17 [I.V.:17] Out: -   CVS- RRR RS- CTA ABD- BS present soft non-distended EXT- no edema Neuro lethargic but awakens and not moving limbs    Basic Metabolic Panel:  Recent Labs Lab 12/20/16 0014 12/21/16 0305 12/22/16 0135 12/22/16 1048 12/23/16 0253  NA 138 140 134* 134* 137  K 5.2* 4.5 6.2* 5.1 5.2*  CL 103 104 103 102 100*  CO2 15* 18* 15* 18* 21*  GLUCOSE 111* 98 95 167* 78  BUN 100* 77* 85* 62* 71*  CREATININE 13.35* 12.54* 13.36* 11.42* 12.52*  CALCIUM 8.8* 8.7* 8.7* 8.9 8.5*  MG 2.5*  --   --   --   --   PHOS 7.4*  --   --   --   --     Liver Function Tests:  Recent Labs Lab 12/19/16 1648 12/22/16 0742  AST 82*  --   ALT 21 14  ALKPHOS 43  --   BILITOT 0.9  --   PROT 6.4*  --   ALBUMIN 3.6  --    No results for input(s): LIPASE, AMYLASE in the last 168 hours.  Recent Labs Lab 12/22/16 1048  AMMONIA 38*    CBC:  Recent Labs Lab 12/19/16 1648  12/19/16 1851 12/20/16 0140 12/20/16 0847 12/21/16 0305 12/22/16 0135 12/22/16 1048 12/23/16 0253  WBC 9.1  --  QUESTIONABLE RESULTS, RECOMMEND  RECOLLECT TO VERIFY 10.8* 9.1 9.3 8.8 12.6* 7.6  NEUTROABS 6.6  --  3.6 9.0*  --   --   --   --   --   HGB 18.7*  < > QUESTIONABLE RESULTS, RECOMMEND RECOLLECT TO VERIFY 17.9* 17.7* 19.1* 18.0* 18.1* 17.4*  HCT 58.6*  < > 29.7* 56.9* 55.7* 59.2* 56.3* 52.9* 53.4*  MCV 104.3*  --  113.4* 102.9* 102.0* 103.3* 102.7* 100.4* 101.1*  PLT 239  --  QUESTIONABLE RESULTS, RECOMMEND RECOLLECT TO VERIFY 236 208 215 259 293 247  < > = values in this interval not displayed.  Cardiac Enzymes:  Recent Labs Lab 12/19/16 2101  TROPONINI 1.21*    BNP: Invalid input(s): POCBNP  CBG:  Recent Labs Lab 12/19/16 1644 12/22/16 0803  GLUCAP 119* 108*    Microbiology: Results for orders placed or performed during the hospital encounter of 12/19/16  MRSA PCR Screening     Status: None   Collection Time: 12/20/16  1:50 AM  Result Value Ref Range Status   MRSA by PCR NEGATIVE NEGATIVE Final    Comment:        The GeneXpert MRSA Assay (FDA approved for NASAL specimens only), is one component  of a comprehensive MRSA colonization surveillance program. It is not intended to diagnose MRSA infection nor to guide or monitor treatment for MRSA infections.     Coagulation Studies:  Recent Labs  12/21/16 1336  LABPROT 12.4  INR 0.93    Urinalysis: No results for input(s): COLORURINE, LABSPEC, PHURINE, GLUCOSEU, HGBUR, BILIRUBINUR, KETONESUR, PROTEINUR, UROBILINOGEN, NITRITE, LEUKOCYTESUR in the last 72 hours.  Invalid input(s): APPERANCEUR    Imaging: Ct Head Wo Contrast  Result Date: 12/22/2016 CLINICAL DATA:  Patient admitted for [place], stroke. MRI demonstrated PRES. EXAM: CT HEAD WITHOUT CONTRAST TECHNIQUE: Contiguous axial images were obtained from the base of the skull through the vertex without intravenous contrast. COMPARISON:  Multiple priors, most recent earlier today at 3:55 a.m. FINDINGS: Brain: Redemonstrated is an intraparenchymal hemorrhage, not significantly changed in size or  shape, roughly 4 x 3 x 5 cm. Mild surrounding vasogenic edema is not increased. Slight effacement RIGHT lateral ventricle. Unchanged midline shift RIGHT to LEFT, roughly 3 mm. No hydrocephalus or ventricular trapping. Effacement of the basilar cisterns redemonstrated. Vascular: No hyperdense vessel or unexpected calcification. Skull: Normal. Negative for fracture or focal lesion. Sinuses/Orbits: No acute finding. Other: None. IMPRESSION: Stable intraparenchymal hemorrhage. Subtle signs of midline shift, and slight increased pressure. But no significant worsening from priors. Electronically Signed   By: Staci Righter M.D.   On: 12/22/2016 09:47   Ct Head Wo Contrast  Result Date: 12/22/2016 CLINICAL DATA:  Follow-up examination for acute intracranial hemorrhage. EXAM: CT HEAD WITHOUT CONTRAST TECHNIQUE: Contiguous axial images were obtained from the base of the skull through the vertex without intravenous contrast. COMPARISON:  Prior CT from 12/21/2016. FINDINGS: Brain: Intraparenchymal hemorrhage centered at the high right frontal parietal region again seen. Hemorrhage overall not significantly changed measuring 4.1 x 3.3 x 5.4 cm seen greatest dimensions (previously 4.1 x 3.4 x 5.5 cm). Surrounding low-density vasogenic edema relatively with sulcal effacement relatively similar. Mass effect on the right lateral ventricle which is partially effaced relatively unchanged. Right-to-left midline shift measures approximately 3 mm, similar. No hydrocephalus or ventricular trapping. No intraventricular extension of hemorrhage. Adjacent small volume subarachnoid hemorrhage noted, similar. Adjacent extra-axial hemorrhage unchanged. No new acute intracranial hemorrhage. No evidence for acute large vessel territory infarct. Vascular: No hyperdense vessel. Scattered vascular calcifications noted within the carotid siphons. Skull: Scalp soft tissues and calvarium unchanged, and remain within normal limits. Sinuses/Orbits:  Globes and orbital soft tissues within normal limits. Paranasal sinuses and mastoid air cells remain clear. IMPRESSION: 1. No significant interval change in posterior right frontal intraparenchymal hematoma with extra-axial extension. Similar localized edema and mass effect with 3 mm right-to-left shift. 2. No other new acute intracranial process. Electronically Signed   By: Jeannine Boga M.D.   On: 12/22/2016 04:49     Medications:   . sodium chloride    . clevidipine 3 mg/hr (12/23/16 0900)  . dialysis solution 2.5% low-MG/low-CA     . amLODipine  10 mg Oral Daily  . carvedilol  12.5 mg Oral BID WC  . cloNIDine  0.1 mg Oral TID  . famotidine  20 mg Oral Daily  . gentamicin cream  1 application Topical Daily  . levETIRAcetam  500 mg Oral BID  . losartan  100 mg Oral QHS  . multivitamin  1 tablet Oral Daily  . pantoprazole  40 mg Oral BID  . senna-docusate  1 tablet Oral BID   sodium chloride, dianeal solution for CAPD/CCPD with heparin, hydrALAZINE, HYDROmorphone (DILAUDID) injection, labetalol  Assessment/  Plan:  1 Acute R hemorrhagic stroke w left hemiparesis - per CCM/ nsurg  Last CT 6/19 no extension of hemorrhage  2 HTN'sive emergency w PRES on MRI - BP labile  she continues on IV clevipine  3 ESRD  Transitioned to PD  4 Hist renal cell cancer, sp R nephrectomy 5 Anemia of CKD -  Stable  6 Volume no vol excess 7 Hyperkalemia -  Hemodialysis 6/19  Initiated and will discontinue the peritoneal dialysis to make management easier. Will need to coordinate with clip        LOS: 4 Encarnacion Bole W @TODAY @10 :18 AM

## 2016-12-23 NOTE — Progress Notes (Signed)
Per Dr Erlinda Hong orders given to maintain SBP <160

## 2016-12-23 NOTE — Consult Note (Signed)
Physical Medicine and Rehabilitation Consult Reason for Consult: Left side weakness Referring Physician: Critical care   HPI: Terry Juarez is a 51 y.o. right handed female with history of hypertension, ESRD with home peritoneal dialysis, right renal cancer status post nephrectomy. Per chart review and mother-in-law patient lives in [place] with a roommate. Independent prior to admission. One level home with 3 steps to entry. Question 24-hour assistance on discharge. Presented 12/19/2016 with new onset of seizure, acute left-sided weakness and right frontal headache. Troponin 1.21. CT scan imaging showed a 29 mm right frontal hemorrhage with surrounding edema and mass effect, also a small SAH and signs of PRES. CTA head and neck showed no AVMs but showed the hematoma was mildly increased from the earlier imaging. Maintain on nicardipine drip as well as Keppra. MRI of the brain reviewed, showing stable hemorrhage. CT head reviewed, stable. EEG showed diffuse cerebral dysfunction no seizure activity. Hemodialysis ongoing as per renal services. Therapy evaluations completed 12/22/2016 with recommendations of physical medicine rehabilitation consult.  Review of Systems  Constitutional: Negative for chills and fever.  HENT: Negative for hearing loss.   Eyes: Negative for blurred vision and double vision.  Respiratory: Negative for cough and shortness of breath.   Cardiovascular: Positive for leg swelling. Negative for chest pain and palpitations.  Gastrointestinal: Positive for constipation. Negative for nausea and vomiting.  Musculoskeletal: Positive for joint pain and myalgias.  Skin: Negative for rash.  Neurological: Positive for dizziness, speech change, focal weakness, weakness and headaches. Negative for sensory change.  All other systems reviewed and are negative.  Past Medical History:  Diagnosis Date  . Anemia   . Bruises easily   . Dialysis patient ([place])   . ESRD on  dialysis ([place])   . Hyperlipidemia   . Hypertension   . Renal disorder    rt renal mass / < functioning of left kidney - being prepared for possible dialysis  . Right renal mass    Past Surgical History:  Procedure Laterality Date  . AV FISTULA PLACEMENT Right 07/11/2014   Procedure: ARTERIOVENOUS (AV) FISTULA CREATION RIGHT ARM BRACHIO-CEPHALIC WITH ATTEMPTED RADIO-CEPHALIC (AV) FISTULA;  Surgeon: Mal Misty, MD;  Location: [place];  Service: Vascular;  Laterality: Right;  . COLONOSCOPY WITH PROPOFOL N/A 09/04/2016   Procedure: COLONOSCOPY WITH PROPOFOL;  Surgeon: Carol Ada, MD;  Location: [facility];  Service: Endoscopy;  Laterality: N/A;  . ECTOPIC PREGNANCY SURGERY  1987  . ESOPHAGOGASTRODUODENOSCOPY N/A 07/30/2016   Procedure: ESOPHAGOGASTRODUODENOSCOPY (EGD);  Surgeon: Carol Ada, MD;  Location: [facility] ENDOSCOPY;  Service: Endoscopy;  Laterality: N/A;  Bedside  . ESOPHAGOGASTRODUODENOSCOPY (EGD) WITH PROPOFOL N/A 09/04/2016   Procedure: ESOPHAGOGASTRODUODENOSCOPY (EGD) WITH PROPOFOL;  Surgeon: Carol Ada, MD;  Location: [facility];  Service: Endoscopy;  Laterality: N/A;  . INSERTION OF DIALYSIS CATHETER Right 07/11/2014   Procedure: INSERTION OF DIALYSIS CATHETER IN RIGHT INTERNAL JUGULAR ;  Surgeon: Mal Misty, MD;  Location: [place];  Service: Vascular;  Laterality: Right;  . LAPAROSCOPIC NEPHRECTOMY Right 07/25/2014   Procedure: RIGHT LAPAROSCOPIC RADICAL NEPHRECTOMY ;  Surgeon: Ardis Hughs, MD;  Location: [facility];  Service: Urology;  Laterality: Right;  . PATCH ANGIOPLASTY Right 07/11/2014   Procedure: PATCH ANGIOPLASTY OF RIGHT RADIAL ARTERY USING CEPHALIC VEIN.;  Surgeon: Mal Misty, MD;  Location: [place];  Service: Vascular;  Laterality: Right;  . THROMBECTOMY W/ EMBOLECTOMY Right 07/11/2014   Procedure: THROMBECTOMY OF RIGHT RADIAL ARTERY  ;  Surgeon: Jeneen Rinks  Rockwell Alexandria, MD;  Location: [facility];  Service: Vascular;  Laterality: Right;   Family History  Problem Relation Age of  Onset  . Throat cancer Mother        smoked  . Hypertension Father    Social History:  reports that she has never smoked. She has never used smokeless tobacco. She reports that she drinks alcohol. She reports that she does not use drugs. Allergies:  Allergies  Allergen Reactions  . Lisinopril Cough   Medications Prior to Admission  Medication Sig Dispense Refill  . acetaminophen (TYLENOL) 500 MG tablet Take 1,000 mg by mouth every 6 (six) hours as needed for moderate pain or headache.    Marland Kitchen amLODipine (NORVASC) 10 MG tablet Take 10 mg by mouth daily.     . calcitRIOL (ROCALTROL) 0.5 MCG capsule Take 0.5 mcg by mouth every Monday, Wednesday, and Friday.    . calcium acetate (PHOSLO) 667 MG capsule Take 667-2,001 mg by mouth See admin instructions. Take 3 capsules (2001 mg) by mouth three times daily with meals and take 1 capsule (667 mg) twice daily with snacks    . carvedilol (COREG) 12.5 MG tablet Take 12.5 mg by mouth 2 (two) times daily.  0  . cinacalcet (SENSIPAR) 60 MG tablet Take 60 mg by mouth at bedtime.    . cloNIDine (CATAPRES) 0.1 MG tablet Take 0.1 mg by mouth 3 (three) times daily as needed (SBP >180).   0  . levothyroxine (SYNTHROID, LEVOTHROID) 88 MCG tablet Take 88 mcg by mouth daily before breakfast.   0  . losartan (COZAAR) 100 MG tablet Take 100 mg by mouth at bedtime.   2  . multivitamin (RENA-VIT) TABS tablet Take 1 tablet by mouth daily.    [place] Calcium 500 MG TABS Take 500 mg by mouth 3 (three) times daily.  0  . sodium bicarbonate 650 MG tablet Take 650 mg by mouth 2 (two) times daily.    . pantoprazole (PROTONIX) 40 MG tablet Take 1 tablet (40 mg total) by mouth 2 (two) times daily. (Patient not taking: Reported on 12/19/2016) 60 tablet 1    Home: [place] expects to be discharged to:: Private residence Living Arrangements: Non-relatives/Friends Available Help at Discharge: Family, Friend(s) Type of Home: House Home Access: Stairs  to enter Technical brewer of Steps: 3 Home Layout: One level Bathroom Shower/Tub: Multimedia programmer: Associate Professor Accessibility: Yes Home Equipment: Civil engineer, contracting  Lives With: Other (Comment) (roommate)  Functional History: Prior Function Level of Independence: Independent Comments: Per mother-in-law Functional Status:  Mobility: Bed Mobility Overal bed mobility: Needs Assistance Bed Mobility: Supine to Sit Rolling: Max assist Sidelying to sit: Mod assist, +2 for physical assistance Supine to sit: Mod assist, +2 for physical assistance General bed mobility comments: Pt initiating LE movement towards EOB this session with semi-roll to the R. Assist required for LUE/LLE movement. Bed pad used to assist trunk into full upright position, and assist at shoulders provided to gain/maintain sitting balance.  Transfers Overall transfer level: Needs assistance Equipment used: 2 person hand held assist Transfers: Sit to/from Stand, Stand Pivot Transfers Sit to Stand: Mod assist, +2 physical assistance Stand pivot transfers: Max assist, +2 physical assistance General transfer comment: L knee blocked during transition to stand, however noted L knee hyperextension with pivot to chair. Pt was able to hold therapist's arm with RUE and with cues to imporve posture, lifted head up to achieve more upright posture.  ADL: ADL Overall ADL's : Needs assistance/impaired Grooming: Moderate assistance, Sitting Grooming Details (indicate cue type and reason): With max assist for sitting balance.  Upper Body Bathing: Maximal assistance, Sitting Lower Body Bathing: Total assistance, Bed level Upper Body Dressing : Maximal assistance, Sitting Lower Body Dressing: Total assistance, Bed level Toileting- Clothing Manipulation and Hygiene: Total assistance, Sit to/from stand General ADL Comments: Unsafe to attempt transfer this session due to decreased sitting balance and medical  status per RN and NP. Pt able to engage in ADL tasks and is limited by lethargy and L sided weakness.   Cognition: Cognition Overall Cognitive Status: Difficult to assess Arousal/Alertness: Lethargic Orientation Level: Oriented X4 Attention: Sustained Sustained Attention: Impaired Sustained Attention Impairment: Verbal basic Memory: Impaired Memory Impairment: Decreased recall of new information, Retrieval deficit Awareness: Impaired Awareness Impairment: Emergent impairment, Anticipatory impairment Safety/Judgment: Impaired Cognition Arousal/Alertness: Lethargic, Suspect due to medications Behavior During Therapy: Flat affect Overall Cognitive Status: Difficult to assess General Comments: Following one-step commands with increased time. Oriented to person, place, month, and year. Level of arousal impacting ability to accurately assess cognition.  Difficult to assess due to: Level of arousal  Blood pressure 114/77, pulse (!) 57, temperature 98 F (36.7 C), temperature source Axillary, resp. rate 14, height 5\' 5"  (1.651 m), weight 56.3 kg (124 lb 1.9 oz), SpO2 97 %. Physical Exam  Vitals reviewed. Constitutional: She appears well-developed.  Frail  HENT:  Head: Normocephalic and atraumatic.  Eyes: Scleral icterus is present.  Pupils sluggish to light Difficulty with EOM  Neck: Normal range of motion. Neck supple. No thyromegaly present.  Cardiovascular: Normal rate and regular rhythm.   Respiratory: Effort normal.  Decreased breath sounds at the bases but clear to auscultation  GI: Soft. Bowel sounds are normal. She exhibits no distension.  Musculoskeletal: She exhibits no edema or tenderness.  Neurological: She is alert.  She did answer basic questions as of age date of birth.  Fair awareness of deficits.  Follows simple commands Motor: RUE/RLE: 4+5 LUE/LLE: 0/5 Sensation intact to light touch  Skin: Skin is warm and dry.  Psychiatric: Her affect is blunt. Her speech is  delayed. She is slowed. She exhibits abnormal remote memory.    Results for orders placed or performed during the hospital encounter of 12/19/16 (from the past 24 hour(s))  Basic metabolic panel     Status: Abnormal   Collection Time: 12/23/16  2:53 AM  Result Value Ref Range   Sodium 137 135 - 145 mmol/L   Potassium 5.2 (H) 3.5 - 5.1 mmol/L   Chloride 100 (L) 101 - 111 mmol/L   CO2 21 (L) 22 - 32 mmol/L   Glucose, Bld 78 65 - 99 mg/dL   BUN 71 (H) 6 - 20 mg/dL   Creatinine, Ser 12.52 (H) 0.44 - 1.00 mg/dL   Calcium 8.5 (L) 8.9 - 10.3 mg/dL   GFR calc non Af Amer 3 (L) >60 mL/min   GFR calc Af Amer 3 (L) >60 mL/min   Anion gap 16 (H) 5 - 15  CBC     Status: Abnormal   Collection Time: 12/23/16  2:53 AM  Result Value Ref Range   WBC 7.6 4.0 - 10.5 K/uL   RBC 5.28 (H) 3.87 - 5.11 MIL/uL   Hemoglobin 17.4 (H) 12.0 - 15.0 g/dL   HCT 53.4 (H) 36.0 - 46.0 %   MCV 101.1 (H) 78.0 - 100.0 fL   MCH 33.0 26.0 - 34.0 pg   MCHC 32.6  30.0 - 36.0 g/dL   RDW 15.3 11.5 - 15.5 %   Platelets 247 150 - 400 K/uL  Triglycerides     Status: Abnormal   Collection Time: 12/23/16  2:53 AM  Result Value Ref Range   Triglycerides 438 (H) <150 mg/dL   Ct Head Wo Contrast  Result Date: 12/22/2016 CLINICAL DATA:  Patient admitted for ICH, stroke. MRI demonstrated PRES. EXAM: CT HEAD WITHOUT CONTRAST TECHNIQUE: Contiguous axial images were obtained from the base of the skull through the vertex without intravenous contrast. COMPARISON:  Multiple priors, most recent earlier today at 3:55 a.m. FINDINGS: Brain: Redemonstrated is an intraparenchymal hemorrhage, not significantly changed in size or shape, roughly 4 x 3 x 5 cm. Mild surrounding vasogenic edema is not increased. Slight effacement RIGHT lateral ventricle. Unchanged midline shift RIGHT to LEFT, roughly 3 mm. No hydrocephalus or ventricular trapping. Effacement of the basilar cisterns redemonstrated. Vascular: No hyperdense vessel or unexpected  calcification. Skull: Normal. Negative for fracture or focal lesion. Sinuses/Orbits: No acute finding. Other: None. IMPRESSION: Stable intraparenchymal hemorrhage. Subtle signs of midline shift, and slight increased pressure. But no significant worsening from priors. Electronically Signed   By: Staci Righter M.D.   On: 12/22/2016 09:47   Ct Head Wo Contrast  Result Date: 12/22/2016 CLINICAL DATA:  Follow-up examination for acute intracranial hemorrhage. EXAM: CT HEAD WITHOUT CONTRAST TECHNIQUE: Contiguous axial images were obtained from the base of the skull through the vertex without intravenous contrast. COMPARISON:  Prior CT from 12/21/2016. FINDINGS: Brain: Intraparenchymal hemorrhage centered at the high right frontal parietal region again seen. Hemorrhage overall not significantly changed measuring 4.1 x 3.3 x 5.4 cm seen greatest dimensions (previously 4.1 x 3.4 x 5.5 cm). Surrounding low-density vasogenic edema relatively with sulcal effacement relatively similar. Mass effect on the right lateral ventricle which is partially effaced relatively unchanged. Right-to-left midline shift measures approximately 3 mm, similar. No hydrocephalus or ventricular trapping. No intraventricular extension of hemorrhage. Adjacent small volume subarachnoid hemorrhage noted, similar. Adjacent extra-axial hemorrhage unchanged. No new acute intracranial hemorrhage. No evidence for acute large vessel territory infarct. Vascular: No hyperdense vessel. Scattered vascular calcifications noted within the carotid siphons. Skull: Scalp soft tissues and calvarium unchanged, and remain within normal limits. Sinuses/Orbits: Globes and orbital soft tissues within normal limits. Paranasal sinuses and mastoid air cells remain clear. IMPRESSION: 1. No significant interval change in posterior right frontal intraparenchymal hematoma with extra-axial extension. Similar localized edema and mass effect with 3 mm right-to-left shift. 2. No  other new acute intracranial process. Electronically Signed   By: Jeannine Boga M.D.   On: 12/22/2016 04:49    Assessment/Plan: Diagnosis: Right frontal hemorrhage, small SAH, and signs of PRES Labs and images independently reviewed.  Records reviewed and summated above.  1. Does the need for close, 24 hr/day medical supervision in concert with the patient's rehab needs make it unreasonable for this patient to be served in a less intensive setting? Yes  2. Co-Morbidities requiring supervision/potential complications: Elevated troponin (recs per Cards), seizure (cont meds), HTN (monitor and provide prns in accordance with increased physical exertion and pain), ESRD with home peritoneal dialysis (recs per Nephro), right renal cancer status post nephrectomy 3. Due to safety, disease management, medication administration and patient education, does the patient require 24 hr/day rehab nursing? Yes 4. Does the patient require coordinated care of a physician, rehab nurse, PT (1-2 hrs/day, 5 days/week), OT (1-2 hrs/day, 5 days/week) and SLP (1-2 hrs/day, 5 days/week) to address physical and  functional deficits in the context of the above medical diagnosis(es)? Yes Addressing deficits in the following areas: balance, endurance, locomotion, strength, transferring, bathing, dressing, toileting, cognition, speech and psychosocial support 5. Can the patient actively participate in an intensive therapy program of at least 3 hrs of therapy per day at least 5 days per week? Potentially 6. The potential for patient to make measurable gains while on inpatient rehab is excellent 7. Anticipated functional outcomes upon discharge from inpatient rehab are min assist and mod assist  with PT, min assist and mod assist with OT, min assist with SLP. 8. Estimated rehab length of stay to reach the above functional goals is: 20-24 days. 9. Anticipated D/C setting: Other 10. Anticipated post D/C treatments:  SNF 11. Overall Rehab/Functional Prognosis: good  RECOMMENDATIONS: This patient's condition is appropriate for continued rehabilitative care in the following setting: CIR when medically appropriate Patient has agreed to participate in recommended program. Yes Note that insurance prior authorization may be required for reimbursement for recommended care.  Comment: Rehab Admissions Coordinator to follow up.  Delice Lesch, MD, Mellody Drown Cathlyn Parsons., PA-C 12/23/2016

## 2016-12-23 NOTE — Progress Notes (Signed)
Physical Therapy Treatment Patient Details Name: Terry Juarez MRN: [id number] DOB: [birth year] Today's Date: 12/23/2016    History of Present Illness Pt is a 51 y/o female who presents s/p witnessed seizure at home with new L-sided weakness. No further seizure activity noted since admission. Imaging revealed R frontal lobe hemorrhage.    PT Comments    Pt progressing towards physical therapy goals. Was able to perform transfers with +2 assist for balance support and safety. Noted pt was able to initiate movement more this session with assist still required for L side. Pt more attentive to L UE this session, positioning it herself with RUE. Encouraged pt to look at the LUE when moving it as well. Will continue to follow and progress as able per POC.    Follow Up Recommendations  CIR;Supervision/Assistance - 24 hour     Equipment Recommendations  Rolling walker with 5" wheels;Wheelchair (measurements PT);Wheelchair cushion (measurements PT)    Recommendations for Other Services       Precautions / Restrictions Precautions Precautions: Fall Precaution Comments: Peritoneal dialysis patient Restrictions Weight Bearing Restrictions: No    Mobility  Bed Mobility Overal bed mobility: Needs Assistance Bed Mobility: Supine to Sit     Supine to sit: Mod assist;+2 for physical assistance     General bed mobility comments: Pt initiating LE movement towards EOB this session with semi-roll to the R. Assist required for LUE/LLE movement. Bed pad used to assist trunk into full upright position, and assist at shoulders provided to gain/maintain sitting balance.   Transfers Overall transfer level: Needs assistance Equipment used: 2 person hand held assist Transfers: Sit to/from [facility] Sit to Stand: Mod assist;+2 physical assistance Stand pivot transfers: Max assist;+2 physical assistance       General transfer comment: L knee blocked during transition to stand,  however noted L knee hyperextension with pivot to chair. Pt was able to hold therapist's arm with RUE and with cues to imporve posture, lifted head up to achieve more upright posture.   Ambulation/Gait                 Stairs            Wheelchair Mobility    Modified Rankin (Stroke Patients Only) Modified Rankin (Stroke Patients Only) Pre-Morbid Rankin Score: No symptoms Modified Rankin: Severe disability     Balance Overall balance assessment: Needs assistance Sitting-balance support: Feet supported;Single extremity supported Sitting balance-Leahy Scale: Poor Sitting balance - Comments: Pushing to the L with RUE on the bed.  Postural control: Posterior lean;Left lateral lean Standing balance support: Bilateral upper extremity supported;During functional activity Standing balance-Leahy Scale: Zero Standing balance comment: +2 required                            Cognition Arousal/Alertness: Lethargic;Suspect due to medications Behavior During Therapy: Flat affect Overall Cognitive Status: Difficult to assess                                 General Comments: Following one-step commands with increased time. Oriented to person, place, month, and year. Level of arousal impacting ability to accurately assess cognition.       Exercises      General Comments        Pertinent Vitals/Pain Pain Assessment: Faces Faces Pain Scale: No hurt    Home Living  Prior Function            PT Goals (current goals can now be found in the care plan section) Acute Rehab PT Goals Patient Stated Goal: Pt did not state goals. PT Goal Formulation: Patient unable to participate in goal setting Time For Goal Achievement: 01/04/17 Potential to Achieve Goals: Good Progress towards PT goals: Progressing toward goals    Frequency    Min 4X/week      PT Plan Current plan remains appropriate    Co-evaluation               AM-PAC PT "6 Clicks" Daily Activity  Outcome Measure  Difficulty turning over in bed (including adjusting bedclothes, sheets and blankets)?: Total Difficulty moving from lying on back to sitting on the side of the bed? : Total Difficulty sitting down on and standing up from a chair with arms (e.g., wheelchair, bedside commode, etc,.)?: Total Help needed moving to and from a bed to chair (including a wheelchair)?: Total Help needed walking in hospital room?: Total Help needed climbing 3-5 steps with a railing? : Total 6 Click Score: 6    End of Session Equipment Utilized During Treatment: Gait belt Activity Tolerance: Patient limited by lethargy Patient left: in chair;with call bell/phone within reach;with chair alarm set (SLP present) Nurse Communication: Mobility status PT Visit Diagnosis: Hemiplegia and hemiparesis Hemiplegia - Right/Left: Left Hemiplegia - caused by: Unspecified     Time: 7014-1030 PT Time Calculation (min) (ACUTE ONLY): 21 min  Charges:  $Gait Training: 8-22 mins                    G Codes:       Terry Juarez, PT, DPT Acute Rehabilitation Services Pager: [phone]    Terry Juarez 12/23/2016, 11:34 AM

## 2016-12-24 ENCOUNTER — Inpatient Hospital Stay (HOSPITAL_COMMUNITY): Payer: BLUE CROSS/BLUE SHIELD

## 2016-12-24 ENCOUNTER — Inpatient Hospital Stay (HOSPITAL_COMMUNITY): Payer: BLUE CROSS/BLUE SHIELD | Admitting: Anesthesiology

## 2016-12-24 ENCOUNTER — Encounter (HOSPITAL_COMMUNITY): Payer: Self-pay | Admitting: *Deleted

## 2016-12-24 DIAGNOSIS — J9601 Acute respiratory failure with hypoxia: Secondary | ICD-10-CM

## 2016-12-24 DIAGNOSIS — G9340 Encephalopathy, unspecified: Secondary | ICD-10-CM

## 2016-12-24 DIAGNOSIS — I469 Cardiac arrest, cause unspecified: Secondary | ICD-10-CM

## 2016-12-24 LAB — BLOOD GAS, ARTERIAL
ACID-BASE DEFICIT: 4.2 mmol/L — AB (ref 0.0–2.0)
Bicarbonate: 19.8 mmol/L — ABNORMAL LOW (ref 20.0–28.0)
Drawn by: 227661
FIO2: 100
LHR: 18 {breaths}/min
O2 Saturation: 99.3 %
PATIENT TEMPERATURE: 98.6
PCO2 ART: 33.2 mmHg (ref 32.0–48.0)
PEEP/CPAP: 5 cmH2O
PH ART: 7.393 (ref 7.350–7.450)
PO2 ART: 487 mmHg — AB (ref 83.0–108.0)
VT: 460 mL

## 2016-12-24 LAB — CBC
HCT: 55.3 % — ABNORMAL HIGH (ref 36.0–46.0)
HEMATOCRIT: 55 % — AB (ref 36.0–46.0)
Hemoglobin: 17.7 g/dL — ABNORMAL HIGH (ref 12.0–15.0)
Hemoglobin: 18 g/dL — ABNORMAL HIGH (ref 12.0–15.0)
MCH: 32.5 pg (ref 26.0–34.0)
MCH: 32.6 pg (ref 26.0–34.0)
MCHC: 32.2 g/dL (ref 30.0–36.0)
MCHC: 32.5 g/dL (ref 30.0–36.0)
MCV: 100.9 fL — AB (ref 78.0–100.0)
MCV: 100.2 fL — AB (ref 78.0–100.0)
PLATELETS: 278 10*3/uL (ref 150–400)
PLATELETS: 253 10*3/uL (ref 150–400)
RBC: 5.45 MIL/uL — AB (ref 3.87–5.11)
RBC: 5.52 MIL/uL — ABNORMAL HIGH (ref 3.87–5.11)
RDW: 15.5 % (ref 11.5–15.5)
RDW: 15.3 % (ref 11.5–15.5)
WBC: 7.9 10*3/uL (ref 4.0–10.5)
WBC: 9.9 10*3/uL (ref 4.0–10.5)

## 2016-12-24 LAB — BASIC METABOLIC PANEL
ANION GAP: 17 — AB (ref 5–15)
BUN: 88 mg/dL — AB (ref 6–20)
CO2: 19 mmol/L — AB (ref 22–32)
CREATININE: 13.76 mg/dL — AB (ref 0.44–1.00)
Calcium: 8.8 mg/dL — ABNORMAL LOW (ref 8.9–10.3)
Chloride: 100 mmol/L — ABNORMAL LOW (ref 101–111)
GFR calc Af Amer: 3 mL/min — ABNORMAL LOW (ref >60)
GFR calc non Af Amer: 3 mL/min — ABNORMAL LOW (ref >60)
Glucose, Bld: 87 mg/dL (ref 65–99)
POTASSIUM: 5.5 mmol/L — AB (ref 3.5–5.1)
SODIUM: 136 mmol/L (ref 135–145)

## 2016-12-24 LAB — PHOSPHORUS: Phosphorus: 11.5 mg/dL — ABNORMAL HIGH (ref 2.5–4.6)

## 2016-12-24 LAB — GLUCOSE, CAPILLARY: GLUCOSE-CAPILLARY: 148 mg/dL — AB (ref 65–99)

## 2016-12-24 LAB — MAGNESIUM: Magnesium: 2.6 mg/dL — ABNORMAL HIGH (ref 1.7–2.4)

## 2016-12-24 MED ORDER — HYDRALAZINE HCL 25 MG PO TABS
25.0000 mg | ORAL_TABLET | Freq: Three times a day (TID) | ORAL | Status: DC
  Administered 2016-12-26 – 2017-01-01 (×17): 25 mg via ORAL
  Filled 2016-12-24 (×18): qty 1

## 2016-12-24 MED ORDER — DELFLEX-LC/1.5% DEXTROSE 344 MOSM/L IP SOLN: INTRAPERITONEAL | Status: DC

## 2016-12-24 MED ORDER — FENTANYL CITRATE (PF) 100 MCG/2ML IJ SOLN: 25.0000 ug | INTRAMUSCULAR | Status: DC | PRN

## 2016-12-24 MED ORDER — PROPOFOL 1000 MG/100ML IV EMUL
INTRAVENOUS | Status: AC
  Administered 2016-12-24: 20 ug/kg/min via INTRAVENOUS
  Filled 2016-12-24: qty 100

## 2016-12-24 MED ORDER — PROPOFOL 1000 MG/100ML IV EMUL
5.0000 ug/kg/min | INTRAVENOUS | Status: DC
  Administered 2016-12-24: 30 ug/kg/min via INTRAVENOUS
  Administered 2016-12-24: 20 ug/kg/min via INTRAVENOUS
  Administered 2016-12-25: 30 ug/kg/min via INTRAVENOUS
  Filled 2016-12-24 (×2): qty 100

## 2016-12-24 MED ORDER — SUCCINYLCHOLINE CHLORIDE 20 MG/ML IJ SOLN
INTRAMUSCULAR | Status: DC | PRN
  Administered 2016-12-24: 60 mg via INTRAVENOUS

## 2016-12-24 MED ORDER — HEPARIN 1000 UNIT/ML FOR PERITONEAL DIALYSIS
INTRAPERITONEAL | Status: DC | PRN
  Filled 2016-12-24: qty 5000

## 2016-12-24 MED ORDER — HEPARIN 1000 UNIT/ML FOR PERITONEAL DIALYSIS: 500.0000 [IU] | INTRAMUSCULAR | Status: DC | PRN

## 2016-12-24 MED ORDER — DELFLEX-LC/1.5% DEXTROSE 344 MOSM/L IP SOLN
INTRAPERITONEAL | Status: DC
  Administered 2016-12-24: 3000 mL via INTRAPERITONEAL
  Administered 2016-12-26: 5000 mL via INTRAPERITONEAL
  Administered 2016-12-27: 18:00:00 via INTRAPERITONEAL

## 2016-12-24 MED ORDER — GENTAMICIN SULFATE 0.1 % EX CREA
1.0000 "application " | TOPICAL_CREAM | Freq: Every day | CUTANEOUS | Status: DC
  Administered 2016-12-24 – 2016-12-27 (×4): 1 via TOPICAL
  Filled 2016-12-24 (×2): qty 15

## 2016-12-24 MED ORDER — PROPOFOL 10 MG/ML IV BOLUS
INTRAVENOUS | Status: DC | PRN
  Administered 2016-12-24: 30 mg via INTRAVENOUS

## 2016-12-24 MED ORDER — PANTOPRAZOLE SODIUM 40 MG IV SOLR
40.0000 mg | Freq: Two times a day (BID) | INTRAVENOUS | Status: DC
  Administered 2016-12-24 – 2016-12-26 (×5): 40 mg via INTRAVENOUS
  Filled 2016-12-24 (×5): qty 40

## 2016-12-24 MED ORDER — LEVOTHYROXINE SODIUM 100 MCG IV SOLR
44.0000 ug | Freq: Every day | INTRAVENOUS | Status: DC
  Administered 2016-12-25 – 2016-12-26 (×2): 44 ug via INTRAVENOUS
  Filled 2016-12-24 (×2): qty 5

## 2016-12-24 MED ORDER — SODIUM CHLORIDE 0.9 % IV SOLN
500.0000 mg | Freq: Two times a day (BID) | INTRAVENOUS | Status: DC
  Administered 2016-12-24 – 2016-12-30 (×13): 500 mg via INTRAVENOUS
  Filled 2016-12-24 (×16): qty 5

## 2016-12-24 NOTE — Progress Notes (Signed)
Called to evaluate pt in HD after brief arrest.  Pt was approx 30 mins into HD treatment when she became bradycardic, hypertensive and unresponsive.  She received atropine and was intubated by anesthesia.  Stat CT head revealed stable frontal hemorrhage with no change in midline shift.  She is now back in ICU. On my exam she is awake, following some commands, intermittent agitation. Stat labs pending.  Per renal MD she had similar episode with previous HD likely r/t volume shifts. Currently hemodynamically stable. Will add low dose propofol, await labs, rest on vent for now and plan for SBT in am if remains stable overnight.   Additional cc time 35 mins  Nickolas Madrid, NP 12/24/2016  12:37 PM Pager: [phone] or [phone]   Attending Note:  51 year old female with ESRD-HD, extensive PMH who we were called for in the HD unit after a cardiac arrest.  Patient was intubated by anesthesia.  On exam, lungs are clear with bradycardia and hypertensive.  I reviewed new head CT myself, no increase in hemorrhage.  Will admit to the ICU and start propofol for sedation.  Full vent support.  ABG and change vent to accommodate.  Need to evaluate cardiac function since this is the second time to arrest during HD.  No cooling since neuro status is relatively back to normal.  The patient is critically ill with multiple organ systems failure and requires high complexity decision making for assessment and support, frequent evaluation and titration of therapies, application of advanced monitoring technologies and extensive interpretation of multiple databases.   Critical Care Time devoted to patient care services described in this note is  35  Minutes. This time reflects time of care of this signee Dr Jennet Maduro. This critical care time does not reflect procedure time, or teaching time or supervisory time of PA/NP/Med student/Med Resident etc but could involve care discussion time.  Rush Farmer,  M.D. [facility] Pulmonary/Critical Care Medicine. Pager: [phone]. After hours pager: [phone].

## 2016-12-24 NOTE — Progress Notes (Addendum)
SLP Cancellation Note  Patient Details Name: SHAVONNE AMBROISE MRN: [id number] DOB: 19-Sep-1965   Cancelled treatment:       Reason Eval/Treat Not Completed: Medical issues which prohibited therapy. Pt had a medical change in status while at HD and is now intubated. Will f/u as able.   Germain Osgood 12/24/2016, 1:51 PM  Germain Osgood, M.A. CCC-SLP [phone]

## 2016-12-24 NOTE — Progress Notes (Signed)
Physical Therapy Treatment Patient Details Name: Terry Juarez MRN: [id number] DOB: 11-12-1965 Today's Date: 12/24/2016    History of Present Illness Pt is a 51 y/o female who presents s/p witnessed seizure at home with new L-sided weakness. No further seizure activity noted since admission. Imaging revealed R frontal lobe hemorrhage.    PT Comments    Pt progressing towards physical therapy goals. Was able to perform transfers with +2 assist for balance support and safety. Noted pt was more attentive to LUE this session, and pt was educated with good return on assisting L side with stronger R side. Pt following commands this session with less delay, and overall appeared eager to participate with therapy. Prior to session beginning, noted BP elevated to 735 systolically, and RN gave BP meds prior to OOB. Will continue to follow and progress as able per POC.   Follow Up Recommendations  CIR;Supervision/Assistance - 24 hour     Equipment Recommendations  Rolling walker with 5" wheels;Wheelchair (measurements PT);Wheelchair cushion (measurements PT)    Recommendations for Other Services       Precautions / Restrictions Precautions Precautions: Fall Restrictions Weight Bearing Restrictions: No    Mobility  Bed Mobility Overal bed mobility: Needs Assistance Bed Mobility: Supine to Sit     Supine to sit: Mod assist;+2 for physical assistance     General bed mobility comments: Pt initiating LE movement towards EOB this session with semi-roll to the R. Assist required for LUE/LLE movement. Bed pad used to assist trunk into full upright position, and assist at shoulders provided to gain/maintain sitting balance.   Transfers Overall transfer level: Needs assistance Equipment used: 2 person hand held assist Transfers: Sit to/from [facility] Sit to Stand: Mod assist;+2 physical assistance Stand pivot transfers: Max assist;+2 physical assistance       General  transfer comment: L knee blocked during transition to stand, however noted L knee hyperextension with pivot to chair. Pt was able to hold therapist's arm with RUE and with cues to imporve posture, lifted head up to achieve more upright posture.   Ambulation/Gait                 Stairs            Wheelchair Mobility    Modified Rankin (Stroke Patients Only) Modified Rankin (Stroke Patients Only) Pre-Morbid Rankin Score: No symptoms Modified Rankin: Severe disability     Balance Overall balance assessment: Needs assistance Sitting-balance support: Feet supported;Single extremity supported Sitting balance-Leahy Scale: Poor Sitting balance - Comments: Pushing to the L with RUE on the bed.  Postural control: Posterior lean;Left lateral lean Standing balance support: Bilateral upper extremity supported;During functional activity Standing balance-Leahy Scale: Zero Standing balance comment: +2 required                            Cognition Arousal/Alertness: Lethargic Behavior During Therapy: Flat affect Overall Cognitive Status: Difficult to assess                                 General Comments: Following one-step commands with increased time. Oriented to person, place, month, and year. Level of arousal impacting ability to accurately assess cognition.       Exercises General Exercises - Upper Extremity Shoulder Flexion: 10 reps;AROM;Right;AAROM;Left General Exercises - Lower Extremity Straight Leg Raises: 10 reps;AROM;Right;AAROM;Left    General Comments  Pertinent Vitals/Pain Pain Assessment: Faces Faces Pain Scale: No hurt    Home Living                      Prior Function            PT Goals (current goals can now be found in the care plan section) Acute Rehab PT Goals Patient Stated Goal: Pt did not state goals. PT Goal Formulation: Patient unable to participate in goal setting Time For Goal Achievement:  01/04/17 Potential to Achieve Goals: Good Progress towards PT goals: Progressing toward goals    Frequency    Min 4X/week      PT Plan Current plan remains appropriate    Co-evaluation              AM-PAC PT "6 Clicks" Daily Activity  Outcome Measure  Difficulty turning over in bed (including adjusting bedclothes, sheets and blankets)?: Total Difficulty moving from lying on back to sitting on the side of the bed? : Total Difficulty sitting down on and standing up from a chair with arms (e.g., wheelchair, bedside commode, etc,.)?: Total Help needed moving to and from a bed to chair (including a wheelchair)?: Total Help needed walking in hospital room?: Total Help needed climbing 3-5 steps with a railing? : Total 6 Click Score: 6    End of Session Equipment Utilized During Treatment: Gait belt Activity Tolerance: Patient limited by lethargy Patient left: in chair;with call bell/phone within reach;with chair alarm set (SLP present) Nurse Communication: Mobility status PT Visit Diagnosis: Hemiplegia and hemiparesis Hemiplegia - Right/Left: Left Hemiplegia - caused by: Unspecified     Time: 8676-7209 PT Time Calculation (min) (ACUTE ONLY): 31 min  Charges:  $Therapeutic Activity: 23-37 mins                    G Codes:       [name], PT, DPT Acute Rehabilitation Services Pager: [phone]   [name] 12/24/2016, 1:11 PM

## 2016-12-24 NOTE — Progress Notes (Signed)
Inpatient Rehabilitation   Attempted to meet with patient to discuss the team's recommendation for IP Rehab.  However, note change in medical status and that patient is now intubated.  Will follow up when medically appropriate.  Please call with questions.  Carmelia Roller., CCC/SLP Admission Coordinator  [place]  Cell [phone]

## 2016-12-24 NOTE — Anesthesia Procedure Notes (Signed)
Procedure Name: Intubation Date/Time: 12/24/2016 11:41 AM Performed by: Suzy Bouchard Pre-anesthesia Checklist: Patient identified, Emergency Drugs available, Suction available, Patient being monitored and Timeout performed Patient Re-evaluated:Patient Re-evaluated prior to inductionOxygen Delivery Method: Circle system utilized Preoxygenation: Pre-oxygenation with 100% oxygen Intubation Type: IV induction Ventilation: Mask ventilation without difficulty Laryngoscope Size: McGraph and 3 Grade View: Grade I Tube type: Subglottic suction tube Tube size: 7.0 mm Number of attempts: 1 Airway Equipment and Method: Stylet and Video-laryngoscopy Placement Confirmation: ETT inserted through vocal cords under direct vision,  positive ETCO2 and breath sounds checked- equal and bilateral Secured at: 22 cm Tube secured with: Tape (taped by RT, ETCO2 per [facility]) Dental Injury: Teeth and Oropharynx as per pre-operative assessment

## 2016-12-24 NOTE — Progress Notes (Signed)
Pt intubated in code by anesthesia, then pt bagged to CT scan, then to ICU and placed on vent per MD orders. ICU RT aware.

## 2016-12-24 NOTE — Progress Notes (Signed)
STROKE TEAM PROGRESS NOTE   SUBJECTIVE (INTERVAL HISTORY) Patient drowsy, lying in bed, opens eyes to voice, follows commands, and is conversant.  HD pending today. Plan to transfer to floor after HD.   OBJECTIVE Temp:  [97.7 F (36.5 C)-98.5 F (36.9 C)] 97.7 F (36.5 C) (06/21 0800) Pulse Rate:  [55-71] 59 (06/21 0800) Cardiac Rhythm: Normal sinus rhythm (06/21 0000) Resp:  [11-19] 13 (06/21 0800) BP: (114-176)/(68-93) 144/75 (06/21 0800) SpO2:  [94 %-98 %] 95 % (06/21 0800) Weight:  [57.8 kg (127 lb 6.8 oz)] 57.8 kg (127 lb 6.8 oz) (06/21 0433)  CBC:   Recent Labs Lab 12/19/16 1851 12/20/16 0140  12/23/16 0253 12/24/16 0508  WBC QUESTIONABLE RESULTS, RECOMMEND RECOLLECT TO VERIFY 10.8*  < > 7.6 7.9  NEUTROABS 3.6 9.0*  --   --   --   HGB QUESTIONABLE RESULTS, RECOMMEND RECOLLECT TO VERIFY 17.9*  < > 17.4* 17.7*  HCT 29.7* 56.9*  < > 53.4* 55.0*  MCV 113.4* 102.9*  < > 101.1* 100.9*  PLT QUESTIONABLE RESULTS, RECOMMEND RECOLLECT TO VERIFY 236  < > 247 278  < > = values in this interval not displayed.  Basic Metabolic Panel:   Recent Labs Lab 12/20/16 0014  12/23/16 0253 12/24/16 0508  NA 138  < > 137 136  K 5.2*  < > 5.2* 5.5*  CL 103  < > 100* 100*  CO2 15*  < > 21* 19*  GLUCOSE 111*  < > 78 87  BUN 100*  < > 71* 88*  CREATININE 13.35*  < > 12.52* 13.76*  CALCIUM 8.8*  < > 8.5* 8.8*  MG 2.5*  --   --  2.6*  PHOS 7.4*  --   --  11.5*  < > = values in this interval not displayed.  Lipid Panel:     Component Value Date/Time   CHOL 214 (H) 02/12/2015 0511   TRIG 438 (H) 12/23/2016 0253   HDL 83 02/12/2015 0511   CHOLHDL 2.6 02/12/2015 0511   VLDL 8 02/12/2015 0511   LDLCALC 123 (H) 02/12/2015 0511   HgbA1c: No results found for: HGBA1C Urine Drug Screen:     Component Value Date/Time   LABOPIA NONE DETECTED 12/19/2016 2051   COCAINSCRNUR NONE DETECTED 12/19/2016 2051   LABBENZ POSITIVE (A) 12/19/2016 2051   AMPHETMU NONE DETECTED 12/19/2016 2051   THCU NONE DETECTED 12/19/2016 2051   LABBARB NONE DETECTED 12/19/2016 2051    Alcohol Level No results found for: [place] I have personally reviewed the radiological images below and agree with the radiology interpretations.  Ct Head Wo Contrast 12/22/2016 IMPRESSION: Stable intraparenchymal hemorrhage. Subtle signs of midline shift, and slight increased pressure. But no significant worsening from priors.    12/22/2016 IMPRESSION: 1. No significant interval change in posterior right frontal intraparenchymal hematoma with extra-axial extension. Similar localized edema and mass effect with 3 mm right-to-left shift. 2. No other new acute intracranial process.   12/21/2016 1. Continued increase in size of right frontal parenchymal hemorrhage with extra-axial extension. 2. Slight increase in associated mass effect. 12/20/2016 1. Slightly increased size of right frontal intraparenchymal hematoma with unchanged right convexity subarachnoid blood, compared to head CTA performed 12/19/2016. Compared to the initial head CT from this presentation, obtained at 5:02 p.m. on 12/19/2016, the hematoma has increased in size.  2. No new hemorrhage, hydrocephalus or mass effect.  12/19/2016 1. Right posterior frontal lobe acute hemorrhage measuring up to 29 mm with small area  of surrounding edema and local mass effect. No significant midline shift or herniation. Follow-up to ensure resolution is recommended to exclude an underlying mass.  2. Small volume of subarachnoid hemorrhage overlying the right frontal convexity.  3. Hypoattenuation within subcortical white matter and bilateral parietal and occipital lobes. Findings are suggestive of PRES in the setting of hypertension. This can be further characterized with MRI of the brain.   Ct Angio Head W Or Wo Contrast Ct Angio Neck W And/or Wo Contrast 12/19/2016 1. No vascular explanation for the right frontal parietal hematoma. Dural venous sinuses are patent  and there is no evidence of vascular malformation.  2. The 16 cc hematoma is mildly increased from earlier CT. Negative for shift or herniation.  3. Fibromuscular dysplasia of the bilateral cervical ICA.  4. Moderate atherosclerosis for age. Negative for flow limiting stenosis.    Mr Brain Wo Contrast 12/19/2016 1. Findings of posterior reversible encephalopathy syndrome.  2. Size stable posterior right frontal hematoma compared to CTA 1 hour prior.   Ct Cervical Spine Wo Contrast 12/19/2016 No acute fracture or dislocation of the cervical spine.   Dg Swallowing Func-speech Pathology 12/20/2016 SLP Diet Recommendations Regular solids;Thin liquid Liquid Administration via Cup;Straw Medication Administration Whole meds with liquid.         EEG 12/21/2016 This awake and asleep EEG is abnormal due to diffuse slowing of the waking background.   PHYSICAL EXAM  Temp:  [97.7 F (36.5 C)-98.5 F (36.9 C)] 97.7 F (36.5 C) (06/21 0800) Pulse Rate:  [55-71] 59 (06/21 0800) Resp:  [11-19] 13 (06/21 0800) BP: (114-176)/(68-93) 144/75 (06/21 0800) SpO2:  [94 %-98 %] 95 % (06/21 0800) Weight:  [57.8 kg (127 lb 6.8 oz)] 57.8 kg (127 lb 6.8 oz) (06/21 0433)  General - Well nourished, well developed, lethargic, opens eyes to voice, oriented  Ophthalmologic - Fundi not visualized due to noncooperation.  Cardiovascular - Regular rate and rhythm.  Neuro - lethargic but able to cooperate with exam, orientated to time, people and place, self and age. Hypophonia but no aphasia, follows commands, right gaze preference, barely cross midline, PERRL, blinking to visual threat on the right, left facial droop, tongue midline. LUE and LLE withdraw on pain stimulation. RUE and RLE spontaneous movement, 3/5. Sensation symmetrical, not cooperative on coordination test, gait not tested.    ASSESSMENT/PLAN [name] is a 51 y.o. female with history of a renal cell carcinoma, hypertension, end-stage  renal disease on peritoneal dialysis, hyperlipidemia, and anemia presenting with seizure, left-sided weakness, and right frontal headache. She did not receive IV t-PA due to [place].  ICH: right frontal ICH with SAH in setting of hypertension with increase in hemorrhage size and MRI evidence of PRES.   Resultant left hemiplegia and left facial droop  CT head -  Right posterior frontal lobe acute hemorrhage measuring up to 29 mm with small area of surrounding edema and local mass effect.  MRI head - Findings of PRES.  CTA - No vascular explanation for the right frontal parietal hematoma. Fibromuscular dysplasia of the bilateral cervical ICA.   Repeat CT head x 2 showed slowing increase in hemorrhage size  Repeat CT x 2 without significant change  2D Echo - 07/30/2016 - EF 60-65%. No cardiac source of emboli identified.  EEG - diffuse slowing  VTE prophylaxis - SCDs Diet renal with fluid restriction Fluid restriction: 1200 mL Fluid; Room service appropriate? Yes; Fluid consistency: Thin  No antithrombotic prior to admission, now  on No antithrombotic due to hemorrhage  Ongoing aggressive stroke risk factor management  Therapy recommendations:  CIR  Disposition: transfer to floor after HD  Hypertensive Emergency  BP fluctuate    worsening hemorrhage and cerebral edema over the weekend  SBP goal to < 140 given increase in hematoma size.  Off cleviprex   Continue norvasc, coreg, clonidine, losartan  Add low dose hydralazine  Nephrology on board   Long-term BP goal normotensive  PRES  MRI confirmed PRES  Likely related to ESRD and hypertension  BP goal < 140   Close BP monitoring  ESRD on PD  Nephrology on board  Previously on PD  Attempted HD 12/22/16 - not tolerating well  Second session of HD today 12/24/2016  Seizure on admission  Started On Keppra   No further seizure  EEG diffuse slowing  Other Active Problems  Hx renal cell cancer s/p R  nephrectomy  Hyperkalemia - nephrology on board.  Anemia of CKD  TSH 15.736 - now on synthroid supplement; s/p thyroidectomy 10/28/2016  Hospital day # 5  This patient is critically ill due to right frontal ICH with SAH, PRES, ESRD on PD, hypertensive emergency and at significant risk of neurological worsening, death form hematoma expansion, status epilepticus, encephalopathy, renal failure, hyperkalemia. This patient's care requires constant monitoring of vital signs, hemodynamics, respiratory and cardiac monitoring, review of multiple databases, neurological assessment, discussion with family, other specialists and medical decision making of high complexity. I spent 35 minutes of neurocritical care time in the care of this patient.   Terry Hawking, MD PhD Stroke Neurology 12/24/2016 9:32 AM    To contact Stroke Continuity provider, please refer to [email or web address] After hours, contact General Neurology

## 2016-12-24 NOTE — Progress Notes (Signed)
[facility] ROUNDING NOTE   Subjective:    Acute hemorrhagic stroke with left hemiparesis.  Developed some unreponsiveness and diapheresis on dialysis. Head CT no change in hemorrhage  Has been transitioned to hemodialysis from peritoneal dialysis Objective:  Vital signs in last 24 hours:  Temp:  [97.7 F (36.5 C)-98.5 F (36.9 C)] 98.1 F (36.7 C) (06/21 0400) Pulse Rate:  [55-85] 61 (06/21 0700) Resp:  [11-19] 12 (06/21 0700) BP: (114-176)/(68-122) 147/82 (06/21 0700) SpO2:  [94 %-98 %] 95 % (06/21 0700) Weight:  [127 lb 6.8 oz (57.8 kg)] 127 lb 6.8 oz (57.8 kg) (06/21 0433)  Weight change: 3 lb 4.9 oz (1.5 kg) Filed Weights   12/22/16 0220 12/22/16 0720 12/24/16 0433  Weight: 123 lb 0.3 oz (55.8 kg) 124 lb 1.9 oz (56.3 kg) 127 lb 6.8 oz (57.8 kg)    Intake/Output: I/O last 3 completed shifts: In: 447.8 [P.O.:100; I.V.:347.8] Out: 400 [Urine:400]   Intake/Output this shift:  No intake/output data recorded.  CVS- RRR RS- CTA ABD- BS present soft non-distended EXT- no edema Neuro lethargic but awakens and not moving limbs    Basic Metabolic Panel:  Recent Labs Lab 12/20/16 0014 12/21/16 0305 12/22/16 0135 12/22/16 1048 12/23/16 0253 12/24/16 0508  NA 138 140 134* 134* 137 136  K 5.2* 4.5 6.2* 5.1 5.2* 5.5*  CL 103 104 103 102 100* 100*  CO2 15* 18* 15* 18* 21* 19*  GLUCOSE 111* 98 95 167* 78 87  BUN 100* 77* 85* 62* 71* 88*  CREATININE 13.35* 12.54* 13.36* 11.42* 12.52* 13.76*  CALCIUM 8.8* 8.7* 8.7* 8.9 8.5* 8.8*  MG 2.5*  --   --   --   --  2.6*  PHOS 7.4*  --   --   --   --  11.5*    Liver Function Tests:  Recent Labs Lab 12/19/16 1648 12/22/16 0742  AST 82*  --   ALT 21 14  ALKPHOS 43  --   BILITOT 0.9  --   PROT 6.4*  --   ALBUMIN 3.6  --    No results for input(s): LIPASE, AMYLASE in the last 168 hours.  Recent Labs Lab 12/22/16 1048  AMMONIA 38*    CBC:  Recent Labs Lab 12/19/16 1648  12/19/16 1851  12/20/16 0140  12/21/16 0305 12/22/16 0135 12/22/16 1048 12/23/16 0253 12/24/16 0508  WBC 9.1  --  QUESTIONABLE RESULTS, RECOMMEND RECOLLECT TO VERIFY 10.8*  < > 9.3 8.8 12.6* 7.6 7.9  NEUTROABS 6.6  --  3.6 9.0*  --   --   --   --   --   --   HGB 18.7*  < > QUESTIONABLE RESULTS, RECOMMEND RECOLLECT TO VERIFY 17.9*  < > 19.1* 18.0* 18.1* 17.4* 17.7*  HCT 58.6*  < > 29.7* 56.9*  < > 59.2* 56.3* 52.9* 53.4* 55.0*  MCV 104.3*  --  113.4* 102.9*  < > 103.3* 102.7* 100.4* 101.1* 100.9*  PLT 239  --  QUESTIONABLE RESULTS, RECOMMEND RECOLLECT TO VERIFY 236  < > 215 259 293 247 278  < > = values in this interval not displayed.  Cardiac Enzymes:  Recent Labs Lab 12/19/16 2101  TROPONINI 1.21*    BNP: Invalid input(s): POCBNP  CBG:  Recent Labs Lab 12/19/16 1644 12/22/16 0803  GLUCAP 119* 108*    Microbiology: Results for orders placed or performed during the hospital encounter of 12/19/16  MRSA PCR Screening     Status: None  Collection Time: 12/20/16  1:50 AM  Result Value Ref Range Status   MRSA by PCR NEGATIVE NEGATIVE Final    Comment:        The GeneXpert MRSA Assay (FDA approved for NASAL specimens only), is one component of a comprehensive MRSA colonization surveillance program. It is not intended to diagnose MRSA infection nor to guide or monitor treatment for MRSA infections.     Coagulation Studies:  Recent Labs  12/21/16 1336  LABPROT 12.4  INR 0.93    Urinalysis: No results for input(s): COLORURINE, LABSPEC, PHURINE, GLUCOSEU, HGBUR, BILIRUBINUR, KETONESUR, PROTEINUR, UROBILINOGEN, NITRITE, LEUKOCYTESUR in the last 72 hours.  Invalid input(s): APPERANCEUR    Imaging: Ct Head Wo Contrast  Result Date: 12/22/2016 CLINICAL DATA:  Patient admitted for [place], stroke. MRI demonstrated PRES. EXAM: CT HEAD WITHOUT CONTRAST TECHNIQUE: Contiguous axial images were obtained from the base of the skull through the vertex without intravenous contrast.  COMPARISON:  Multiple priors, most recent earlier today at 3:55 a.m. FINDINGS: Brain: Redemonstrated is an intraparenchymal hemorrhage, not significantly changed in size or shape, roughly 4 x 3 x 5 cm. Mild surrounding vasogenic edema is not increased. Slight effacement RIGHT lateral ventricle. Unchanged midline shift RIGHT to LEFT, roughly 3 mm. No hydrocephalus or ventricular trapping. Effacement of the basilar cisterns redemonstrated. Vascular: No hyperdense vessel or unexpected calcification. Skull: Normal. Negative for fracture or focal lesion. Sinuses/Orbits: No acute finding. Other: None. IMPRESSION: Stable intraparenchymal hemorrhage. Subtle signs of midline shift, and slight increased pressure. But no significant worsening from priors. Electronically Signed   By: Staci Righter M.D.   On: 12/22/2016 09:47     Medications:   . sodium chloride 250 mL (12/24/16 0700)  . clevidipine Stopped (12/23/16 1100)  . dialysis solution 2.5% low-MG/low-CA     . amLODipine  10 mg Oral Daily  . carvedilol  12.5 mg Oral BID WC  . cloNIDine  0.1 mg Oral TID  . famotidine  20 mg Oral Daily  . gentamicin cream  1 application Topical Daily  . hydrALAZINE  25 mg Oral Q8H  . levETIRAcetam  500 mg Oral BID  . levothyroxine  88 mcg Oral QAC breakfast  . losartan  100 mg Oral QHS  . multivitamin  1 tablet Oral Daily  . pantoprazole  40 mg Oral BID  . senna-docusate  1 tablet Oral BID   sodium chloride, dianeal solution for CAPD/CCPD with heparin, hydrALAZINE, HYDROmorphone (DILAUDID) injection, labetalol  Assessment/ Plan:  1 Acute R hemorrhagic stroke w left hemiparesis - per CCM/ nsurg  Last CT 6/19 no extension of hemorrhage  2 HTN'sive emergency w PRES on MRI - BP labile  she continues on IV clevipine  3 ESRD  Transitioned to PD  4 Hist renal cell cancer, sp R nephrectomy 5 Anemia of CKD -  Stable  6 Volume no vol excess 7 Hyperkalemia - Hemodialysis 6/19 tolerated poorly  Will attempt  dialysis today    LOS: 5 Merlin Ege W @TODAY @8 :19 AM

## 2016-12-24 NOTE — Progress Notes (Signed)
Approximately 30 minutes post initiation of dialysis pt c/o of feeling hot.  B/p was assessed and noted to be 179/89 with HR of 60.  Pt then became diaphoretic and pt stated she was beginning to get a headache and felt as if she had to vomit. UF was turned off. CBG was taken and resulted at 148.  Cold wet cloth applied to patient fore head and small portable fan brought to bedside to assist in cooling the pt.  Pt stated she had to urinate and was placed on bed pan.  B/p recycled and read 185/87 with HR 52.  Juanell Fairly, NP present at the beside.  HR noted to be 49.  Orders received to discontinue HD and rinse pt blood back.. B/P at this time 172/76 HR 49.  Pt noted to be less responsive and HR now at 40.  Atropine 1mg  given @ 1120 by Juanell Fairly, NP.  Nephrology noted and Dr Hollie Salk presented to bedside.  Rapid response in route.  Upon rapid response arrival pt noted to be pulseless for approximately 30 seconds.  Code Blue called.  Pads in place.  Pt responded to Atropine HR 108.  No compressions applied.  Code Team and Dr Nelda Marseille present at the bedside.  Pt intubated for respiratory support.  Report called to Trenton Founds, RN of [phone].  Pt escorted to CT by rapid response and RT and will resume to 4N25 post CT.

## 2016-12-24 NOTE — Code Documentation (Signed)
  Patient Name: Terry Juarez   MRN: [id number]   Date of Birth/ Sex: 07-18-1965 , female      Admission Date: 12/19/2016  Attending Provider: Juanito Doom, MD  Primary Diagnosis: <principal problem not specified>   Indication: Pt was in her usual state of health until this AM, when she was noted to be bradycardic, hypertensive, and unresponsive. Code blue was subsequently called. At the time of arrival on scene, ACLS protocol was underway.   Technical Description:  - CPR performance duration:  n/a  - Was defibrillation or cardioversion used? No   - Was external pacer placed? No  - Was patient intubated pre/post CPR? Yes   Medications Administered: Y = Yes; Blank = No Amiodarone    Atropine  Y  Calcium    Epinephrine    Lidocaine    Magnesium    Norepinephrine    Phenylephrine    Sodium bicarbonate    Vasopressin     Post CPR evaluation:  - Final Status - Was patient successfully resuscitated ? Yes - What is current rhythm? Sinus - What is current hemodynamic status? Labile  Miscellaneous Information:  - Labs sent, including:   - Primary team notified?  Yes  - Family Notified? In process  - Additional notes/ transfer status: PCCM arrived, patient to return to ICU     Zada Finders, MD  12/24/2016, 11:42 AM

## 2016-12-24 NOTE — Progress Notes (Signed)
PULMONARY / CRITICAL CARE MEDICINE   Name: Terry Juarez MRN: [id number] DOB: [birth year]    ADMISSION DATE:  12/19/2016 CONSULTATION DATE: Dr Ashok Cordia  CHIEF COMPLAINT: ICH  BRIEF:  50yoF with history of ESRD on Peritoneal Dialysis, HTN, Right renal mass, and Anemia,   Presented to ER on 6/16 c/o acute left-sided weakness and right frontal HA. Head CT showed large 22mm right frontal hemorrhage with surrounding edema and mass effect, also with small SAH and signs of PRES. CTA Head/Neck showed no AVM's but but show the hematoma was mildly increased from the earlier imaging. Neurology saw patient in the ER and started Nicardipine and Keppra. Brain MRI showed no changes in the size of right frontal hematoma compared to CTA; it also confirmed PRES.    SUBJECTIVE:   Off clevidipine gtt. No events overnight. Plans for HD today    VITAL SIGNS: BP (!) 144/75   Pulse (!) 59   Temp 97.7 F (36.5 C) (Oral)   Resp 13   Ht 5\' 5"  (1.651 m)   Wt 57.8 kg (127 lb 6.8 oz)   SpO2 95%   BMI 21.20 kg/m   HEMODYNAMICS:  2 PIV's (18G and 20G in LUE) RUE AV fistula   INTAKE / OUTPUT: I/O last 3 completed shifts: In: 447.8 [P.O.:100; I.V.:347.8] Out: 400 [Urine:400]  PHYSICAL EXAMINATION:  General appearance: adult female, no distress Eyes: PERRL, EOMI bilaterally. Mouth:  membranes and no mucosal ulcerations  Neck: Trachea midline, no JVD  Lungs/chest: CTA, with normal respiratory effort, no wheeze/crackles   CV: RRR, no MRGs  Abdomen: Soft, non-tender, non-distended  Extremities: -edema  Skin: warm, dry, intact  Psych: alert, delayed responses, follows commands, moves extremities    LABS:  BMET  Recent Labs Lab 12/22/16 1048 12/23/16 0253 12/24/16 0508  NA 134* 137 136  K 5.1 5.2* 5.5*  CL 102 100* 100*  CO2 18* 21* 19*  BUN 62* 71* 88*  CREATININE 11.42* 12.52* 13.76*  GLUCOSE 167* 78 87   Electrolytes  Recent Labs Lab 12/20/16 0014  12/22/16 1048 12/23/16 0253  12/24/16 0508  CALCIUM 8.8*  < > 8.9 8.5* 8.8*  MG 2.5*  --   --   --  2.6*  PHOS 7.4*  --   --   --  11.5*  < > = values in this interval not displayed. CBC  Recent Labs Lab 12/22/16 1048 12/23/16 0253 12/24/16 0508  WBC 12.6* 7.6 7.9  HGB 18.1* 17.4* 17.7*  HCT 52.9* 53.4* 55.0*  PLT 293 247 278   Coag's  Recent Labs Lab 12/19/16 1818 12/19/16 2020 12/21/16 1336  INR 0.97 NOT CALCULATED 0.93   Sepsis Markers  Recent Labs Lab 12/19/16 1658 12/19/16 2106  LATICACIDVEN 5.86* 1.54   ABG  Recent Labs Lab 12/19/16 2135 12/20/16 0520  PHART 7.261* 7.299*  PCO2ART 41.4 36.0  PO2ART 133.0* 89.9    Liver Enzymes  Recent Labs Lab 12/19/16 1648 12/22/16 0742  AST 82*  --   ALT 21 14  ALKPHOS 43  --   BILITOT 0.9  --   ALBUMIN 3.6  --    Cardiac Enzymes  Recent Labs Lab 12/19/16 2101  TROPONINI 1.21*    Glucose  Recent Labs Lab 12/19/16 1644 12/22/16 0803  GLUCAP 119* 108*    Imaging No results found.  LINES/TUBES: Peritoneal dialysis catheter RUE Fistula LUE PIV's   ASSESSMENT / PLAN: 48yoF with history of ESRD on Peritoneal Dialysis, HTN, Right renal mass, and Anemia, who  was found down on the floor seizing today and found to have a large right frontal hemorrhage with surrounding edema and mass effect, also with small SAH and signs of PRES.   ICH/SAH in setting of PRES -CT head 6/19 Stable, MRI with findings of PRES -Seizure on admission  Plan  Per Neurology  Monitor   HTN emergency with PRES -ECHO 07/30/16 EF 60-65%  H/O HLD  Plan Cardiac Monitoring  Systolic goal < 157 mmHg  Continue norvasc, coreg and PRN hydralazine  Continue Losartan  PRN hydralazine   ESRD on PD Hyperkalemia  Metabolic acidosis > improving H/O renal cell cancer s/p right nephrectomy  Plan Per Nephrology  Trend BMP  Replace electrolytes as needed  HD planned for today > did not tolerate 6/19   Polycythemia - consistent on serial CBC. Etiology  unclear. Hgb down somewhat today Plan  -Trend CBC -Maintain Hbg > 7   Nutrition  Plan -Renal Diet  -PPI   Hypothyroidism  Plan  -Continue home synthroid   FAMILY  - Updates: no family at bedside, patient updated on plan   If HD goes well then will transfer to telemetry today   CC Time: 33 minutes   Hayden Pedro, AGACNP-BC [facility]  Pgr: [phone]  PCCM Pgr: [phone]

## 2016-12-24 NOTE — Progress Notes (Signed)
OT Cancellation Note  Patient Details Name: Terry Juarez MRN: [id number] DOB: [birth year]   Cancelled Treatment:    Reason Eval/Treat Not Completed: Patient at procedure or test/ unavailable. Pt in hemodialysis.  Almon Register [id number] 12/24/2016, 11:39 AM

## 2016-12-25 ENCOUNTER — Inpatient Hospital Stay (HOSPITAL_COMMUNITY): Payer: BLUE CROSS/BLUE SHIELD

## 2016-12-25 DIAGNOSIS — E875 Hyperkalemia: Secondary | ICD-10-CM

## 2016-12-25 LAB — CBC
HEMATOCRIT: 55.9 % — AB (ref 36.0–46.0)
Hemoglobin: 18.7 g/dL — ABNORMAL HIGH (ref 12.0–15.0)
MCH: 33.1 pg (ref 26.0–34.0)
MCHC: 33.5 g/dL (ref 30.0–36.0)
MCV: 98.9 fL (ref 78.0–100.0)
Platelets: 159 10*3/uL (ref 150–400)
RBC: 5.65 MIL/uL — ABNORMAL HIGH (ref 3.87–5.11)
RDW: 15.4 % (ref 11.5–15.5)
WBC: 10.6 10*3/uL — ABNORMAL HIGH (ref 4.0–10.5)

## 2016-12-25 LAB — BASIC METABOLIC PANEL
Anion gap: 15 (ref 5–15)
BUN: 65 mg/dL — AB (ref 6–20)
CALCIUM: 8.8 mg/dL — AB (ref 8.9–10.3)
CO2: 20 mmol/L — ABNORMAL LOW (ref 22–32)
Chloride: 99 mmol/L — ABNORMAL LOW (ref 101–111)
Creatinine, Ser: 10.94 mg/dL — ABNORMAL HIGH (ref 0.44–1.00)
GFR calc Af Amer: 4 mL/min — ABNORMAL LOW (ref >60)
GFR, EST NON AFRICAN AMERICAN: 4 mL/min — AB (ref >60)
GLUCOSE: 122 mg/dL — AB (ref 65–99)
Potassium: 4.8 mmol/L (ref 3.5–5.1)
Sodium: 134 mmol/L — ABNORMAL LOW (ref 135–145)

## 2016-12-25 LAB — CBC WITH DIFFERENTIAL/PLATELET
BASOS ABS: 0 10*3/uL (ref 0.0–0.1)
Basophils Relative: 0 %
EOS ABS: 0 10*3/uL (ref 0.0–0.7)
EOS PCT: 1 %
HCT: 29.7 % — ABNORMAL LOW (ref 36.0–46.0)
HEMOGLOBIN: 8.3 g/dL — AB (ref 12.0–15.0)
LYMPHS ABS: 0.4 10*3/uL — AB (ref 0.7–4.0)
Lymphocytes Relative: 9 %
MCH: 31.7 pg (ref 26.0–34.0)
MCHC: 27.9 g/dL — ABNORMAL LOW (ref 30.0–36.0)
MCV: 113.4 fL — ABNORMAL HIGH (ref 78.0–100.0)
MONO ABS: 0.1 10*3/uL (ref 0.1–1.0)
Monocytes Relative: 3 %
Neutro Abs: 3.6 10*3/uL (ref 1.7–7.7)
Neutrophils Relative %: 87 %
PLATELETS: 109 10*3/uL — AB (ref 150–400)
RBC: 2.62 MIL/uL — AB (ref 3.87–5.11)
RDW: 16.1 % — ABNORMAL HIGH (ref 11.5–15.5)
WBC: 4.1 10*3/uL (ref 4.0–10.5)

## 2016-12-25 LAB — BLOOD GAS, ARTERIAL
Acid-base deficit: 2 mmol/L (ref 0.0–2.0)
Bicarbonate: 20.5 mmol/L (ref 20.0–28.0)
DRAWN BY: 24487
FIO2: 50
MECHVT: 460 mL
O2 SAT: 98.8 %
PEEP: 5 cmH2O
PH ART: 7.521 — AB (ref 7.350–7.450)
PO2 ART: 171 mmHg — AB (ref 83.0–108.0)
Patient temperature: 98.6
RATE: 18 resp/min
pCO2 arterial: 25.1 mmHg — ABNORMAL LOW (ref 32.0–48.0)

## 2016-12-25 LAB — PROTIME-INR
INR: 1.88
PROTHROMBIN TIME: 21.9 s — AB (ref 11.4–15.2)

## 2016-12-25 LAB — TRIGLYCERIDES
Triglycerides: 1127 mg/dL — ABNORMAL HIGH (ref <150)
Triglycerides: 126 mg/dL (ref <150)

## 2016-12-25 LAB — PHOSPHORUS: PHOSPHORUS: 7.5 mg/dL — AB (ref 2.5–4.6)

## 2016-12-25 LAB — MAGNESIUM: MAGNESIUM: 2.4 mg/dL (ref 1.7–2.4)

## 2016-12-25 MED ORDER — CHLORHEXIDINE GLUCONATE 0.12% ORAL RINSE (MEDLINE KIT)
15.0000 mL | Freq: Two times a day (BID) | OROMUCOSAL | Status: DC
  Administered 2016-12-25: 15 mL via OROMUCOSAL

## 2016-12-25 MED ORDER — ORAL CARE MOUTH RINSE
15.0000 mL | Freq: Two times a day (BID) | OROMUCOSAL | Status: DC
  Administered 2016-12-26: 15 mL via OROMUCOSAL

## 2016-12-25 MED ORDER — CALCITRIOL 0.25 MCG PO CAPS
0.2500 ug | ORAL_CAPSULE | ORAL | Status: DC
  Administered 2016-12-28 – 2017-01-01 (×3): 0.25 ug via ORAL
  Filled 2016-12-25 (×4): qty 1

## 2016-12-25 MED ORDER — ORAL CARE MOUTH RINSE
15.0000 mL | Freq: Four times a day (QID) | OROMUCOSAL | Status: DC
Start: 2016-12-25 — End: 2016-12-25
  Administered 2016-12-25: 15 mL via OROMUCOSAL

## 2016-12-25 MED ORDER — CINACALCET HCL 30 MG PO TABS
60.0000 mg | ORAL_TABLET | Freq: Every day | ORAL | Status: DC
  Administered 2016-12-26 – 2017-01-04 (×10): 60 mg via ORAL
  Filled 2016-12-25 (×11): qty 2

## 2016-12-25 MED ORDER — CALCIUM ACETATE (PHOS BINDER) 667 MG PO CAPS
1334.0000 mg | ORAL_CAPSULE | Freq: Three times a day (TID) | ORAL | Status: DC
  Administered 2016-12-26 – 2017-01-04 (×30): 1334 mg via ORAL
  Filled 2016-12-25 (×31): qty 2

## 2016-12-25 NOTE — Procedures (Signed)
Extubation Procedure Note  Patient Details:   Name: Terry Juarez DOB: 06/02/1966 MRN: [id number]   Airway Documentation:     Evaluation  O2 sats: stable throughout Complications: No apparent complications Patient did tolerate procedure well. Bilateral Breath Sounds: Clear   Yes   Pt extubated to 3L [place] per MD order. Pt VS within normal limits. Pt able to speak but has a very weak non productive cough. RN at bedside. RT will continue to monitor.   Jesse Sans 12/25/2016, 12:16 PM

## 2016-12-25 NOTE — Progress Notes (Signed)
PULMONARY / CRITICAL CARE MEDICINE   Name: Terry Juarez MRN: [id number] DOB: 08-Aug-1965    ADMISSION DATE:  12/19/2016 CONSULTATION DATE: Dr Ashok Cordia  CHIEF COMPLAINT: ICH  BRIEF:  50yoF with history of ESRD on Peritoneal Dialysis, HTN, Right renal mass, and Anemia,   Presented to ER on 6/16 c/o acute left-sided weakness and right frontal HA. Head CT showed large 92mm right frontal hemorrhage with surrounding edema and mass effect, also with small SAH and signs of PRES. CTA Head/Neck showed no AVM's but but show the hematoma was mildly increased from the earlier imaging. Neurology saw patient in the ER and started Nicardipine and Keppra. Brain MRI showed no changes in the size of right frontal hematoma compared to CTA; it also confirmed PRES.    SUBJECTIVE:   Decompensated in HD yesterday requiring intubation. Now weaning off propofol gtt.    VITAL SIGNS: BP (!) 146/89   Pulse 75   Temp 97.8 F (36.6 C) (Axillary)   Resp 19   Ht 5\' 5"  (1.651 m)   Wt 58.5 kg (128 lb 15.5 oz)   SpO2 98%   BMI 21.46 kg/m   HEMODYNAMICS:  2 PIV's (18G and 20G in LUE) RUE AV fistula   INTAKE / OUTPUT: I/O last 3 completed shifts: In: 601 [I.V.:496; IV Piggyback:105] Out: -223   PHYSICAL EXAMINATION:  General appearance: adult female, no distress Eyes: PERRL, EOMI bilaterally. Mouth:  membranes and no mucosal ulcerations  Neck: Trachea midline, no JVD  Lungs/chest: CTA, non-labored, on vent  CV: RRR, no MRGs  Abdomen: Soft, non-tender, non-distended  Extremities: -edema  Skin: warm, dry, intact  Psych: alert, delayed responses, follows commands, moves RUE/RLE on command   LABS:  BMET  Recent Labs Lab 12/23/16 0253 12/24/16 0508 12/25/16 0245  NA 137 136 134*  K 5.2* 5.5* 4.8  CL 100* 100* 99*  CO2 21* 19* 20*  BUN 71* 88* 65*  CREATININE 12.52* 13.76* 10.94*  GLUCOSE 78 87 122*   Electrolytes  Recent Labs Lab 12/20/16 0014  12/23/16 0253 12/24/16 0508 12/25/16 0245   CALCIUM 8.8*  < > 8.5* 8.8* 8.8*  MG 2.5*  --   --  2.6* 2.4  PHOS 7.4*  --   --  11.5* 7.5*  < > = values in this interval not displayed. CBC  Recent Labs Lab 12/24/16 0508 12/24/16 1238 12/25/16 0245  WBC 7.9 9.9 10.6*  HGB 17.7* 18.0* 18.7*  HCT 55.0* 55.3* 55.9*  PLT 278 253 159   Coag's  Recent Labs Lab 12/19/16 1818 12/19/16 2020 12/21/16 1336  INR 0.97 NOT CALCULATED 0.93   Sepsis Markers  Recent Labs Lab 12/19/16 1658 12/19/16 2106  LATICACIDVEN 5.86* 1.54   ABG  Recent Labs Lab 12/20/16 0520 12/24/16 1310 12/25/16 0445  PHART 7.299* 7.393 7.521*  PCO2ART 36.0 33.2 25.1*  PO2ART 89.9 487* 171*    Liver Enzymes  Recent Labs Lab 12/19/16 1648 12/22/16 0742  AST 82*  --   ALT 21 14  ALKPHOS 43  --   BILITOT 0.9  --   ALBUMIN 3.6  --    Cardiac Enzymes  Recent Labs Lab 12/19/16 2101  TROPONINI 1.21*    Glucose  Recent Labs Lab 12/19/16 1644 12/22/16 0803 12/24/16 1108  GLUCAP 119* 108* 148*    Imaging Ct Head Wo Contrast  Result Date: 12/24/2016 CLINICAL DATA:  Intraparenchymal hemorrhage. EXAM: CT HEAD WITHOUT CONTRAST TECHNIQUE: Contiguous axial images were obtained from the base of the skull  through the vertex without intravenous contrast. COMPARISON:  Multiple prior head CTs, most recently 12/22/2016 FINDINGS: Brain: The right frontal hemorrhage is stable in size. Evolving surrounding edema is noted. 3-4 mm of midline shift is not significantly changed. No new hemorrhage is present. The subarachnoid hemorrhage is not significantly changed. Vascular: Atherosclerotic calcifications are again noted. Skull: Calvarium is intact. Sinuses/Orbits: The paranasal sinuses and mastoid air cells are clear. IMPRESSION: 1. Stable right frontal hemorrhage with evolving surrounding edema. 2. Stable 3-4 mm of midline shift. Electronically Signed   By: San Morelle M.D.   On: 12/24/2016 12:22   Dg Chest Port 1 View  Result Date:  12/25/2016 CLINICAL DATA:  Intubation. EXAM: PORTABLE CHEST 1 VIEW COMPARISON:  07/29/2016 . FINDINGS: Endotracheal tube noted with its tip 4.1 cm above the carina. Heart size normal. No focal infiltrate. Prominent nipple shadows again noted. No pleural effusion or pneumothorax. IMPRESSION: 1.  Endotracheal tube tip noted 4.1 cm above the carina. 2. No acute cardiopulmonary disease. Electronically Signed   By: Marcello Moores  Register   On: 12/25/2016 07:14    LINES/TUBES: Peritoneal dialysis catheter RUE Fistula LUE PIV's   ASSESSMENT / PLAN: 50yoF with history of ESRD on Peritoneal Dialysis, HTN, Right renal mass, and Anemia, who was found down on the floor seizing today and found to have a large right frontal hemorrhage with surrounding edema and mass effect, also with small SAH and signs of PRES.   Respiratory Insufficiency following HD Plan -Vent Support -Currently Weaning  -Maintain Saturation >92 -Pulmonary Hygiene  -This is the second time that patient has become unresponsive during HD > will speak to Nephrology about plans before extubation  ICH/SAH in setting of PRES -CT head 6/19 Stable, MRI with findings of PRES -Seizure on admission  Plan  -Per Neurology  -Monitor   HTN emergency with PRES -ECHO 07/30/16 EF 60-65%  H/O HLD  Plan -Cardiac Monitoring  -Systolic goal < 517 mmHg  -Continue norvasc, coreg and PRN hydralazine  -Continue Losartan  -PRN hydralazine   ESRD on PD Hyperkalemia  Metabolic acidosis > improving H/O renal cell cancer s/p right nephrectomy  Plan -Per Nephrology  -Trend BMP  -Replace electrolytes as needed  -Currently on PD trying to switch to HD > HD on 6/19 and 6/21 (both times patient became HTN, Tachy, and unresponsive)   Polycythemia - consistent on serial CBC. Etiology unclear. Hgb down somewhat today Plan  -Trend CBC -Maintain Hbg > 7   Nutrition  Plan -NPO -PPI -If do not extubate today will place OG and start TF   Hypothyroidism   Plan  -Continue home synthroid   FAMILY  - Updates: no family at bedside, patient updated on plan    CC Time: 45 minutes   Hayden Pedro, AGACNP-BC [place]  Pgr: [phone]  PCCM Pgr: [phone]

## 2016-12-25 NOTE — Progress Notes (Signed)
eLink Physician-Brief Progress Note Patient Name: Terry Juarez DOB: 06-25-1966 MRN: [id number]   Date of Service  12/25/2016  HPI/Events of Note  Camera check on patient postextubation. Currently on nasal cannula oxygen. Normal respiratory effort.   eICU Interventions  Continuing ICU monitoring postextubation.      Intervention Category Major Interventions: Respiratory failure - evaluation and management  Tera Partridge 12/25/2016, 6:39 PM

## 2016-12-25 NOTE — Progress Notes (Signed)
Subjective:  Patient had another episode yesterday of bradycardia, unresponsiveness and sever hypertension with HD yest- she had a similar reaction the day before.  Therefore it was decided to change her back to PD- had PD treatment overnight   Objective Vital signs in last 24 hours: Vitals:   12/25/16 0815 12/25/16 0900 12/25/16 1000 12/25/16 1130  BP: (!) 176/99 (!) 150/91 (!) 144/89   Pulse: 60 60 61   Resp: '19 15 14   ' Temp: 97.8 F (36.6 C)   98.3 F (36.8 C)  TempSrc: Axillary   Axillary  SpO2: 99% 99% 99%   Weight:      Height:       Weight change: -2.6 kg (-5 lb 11.7 oz)  Intake/Output Summary (Last 24 hours) at 12/25/16 1132 Last data filed at 12/25/16 0815  Gross per 24 hour  Intake         10460.97 ml  Output            14830 ml  Net         -4369.03 ml    Assessment/ Plan: Pt is a 51 y.o. yo female with ESRD who was admitted on 12/19/2016 with sz/hemiparesis- found to have right sided frontal lobe hemorrhage Assessment/Plan: 1. Intracranial hemorrhage- did not require surgery /PRES also present- was making some clinical recovery but now s/p episode of unresponsiveness "code" on hemodialysis yesterday.  Ultimately planning for rehab 2. ESRD - normally on PD as OP- thought to change to HD because unsure if she could continue to do PD at home given defecits. However, clearly is NOT TOLERATING HD- unclear exactly why.  We will be able to do her PD for her while she is at [facility] so will plan to do that for now.  If is looking like she cannot do as OP in the future we will challenge her again with HD but much in the fulture 3. Anemia- history of GIB and low hgb- has responded remarkable well to ESA and that is part of reason for hypertension 4. Secondary hyperparathyroidism- resume phoslo, calcitriol and sensipar  5. HTN/volume- goal for higher BP- previously on cleviprex.  Now coreg 12.5 BID, amlodipine 10 daily and cozaar 100 daily, clonidine 0.3 daily, hydralazine 25 TID with  prns of hydralazine and labetalol.  She is usually on 3 drugs, not 5- may be able to simplify reg - using 1.5 % fluid for now  Shaneese Tait A    Labs: Basic Metabolic Panel:  Recent Labs Lab 12/20/16 0014  12/23/16 0253 12/24/16 0508 12/25/16 0245  NA 138  < > 137 136 134*  K 5.2*  < > 5.2* 5.5* 4.8  CL 103  < > 100* 100* 99*  CO2 15*  < > 21* 19* 20*  GLUCOSE 111*  < > 78 87 122*  BUN 100*  < > 71* 88* 65*  CREATININE 13.35*  < > 12.52* 13.76* 10.94*  CALCIUM 8.8*  < > 8.5* 8.8* 8.8*  PHOS 7.4*  --   --  11.5* 7.5*  < > = values in this interval not displayed. Liver Function Tests:  Recent Labs Lab 12/19/16 1648 12/22/16 0742  AST 82*  --   ALT 21 14  ALKPHOS 43  --   BILITOT 0.9  --   PROT 6.4*  --   ALBUMIN 3.6  --    No results for input(s): LIPASE, AMYLASE in the last 168 hours.  Recent Labs Lab 12/22/16 1048  AMMONIA 38*  CBC:  Recent Labs Lab 12/19/16 1648  12/19/16 1851 12/20/16 0140  12/22/16 1048 12/23/16 0253 12/24/16 0508 12/24/16 1238 12/25/16 0245  WBC 9.1  --  QUESTIONABLE RESULTS, RECOMMEND RECOLLECT TO VERIFY 10.8*  < > 12.6* 7.6 7.9 9.9 10.6*  NEUTROABS 6.6  --  3.6 9.0*  --   --   --   --   --   --   HGB 18.7*  < > QUESTIONABLE RESULTS, RECOMMEND RECOLLECT TO VERIFY 17.9*  < > 18.1* 17.4* 17.7* 18.0* 18.7*  HCT 58.6*  < > 29.7* 56.9*  < > 52.9* 53.4* 55.0* 55.3* 55.9*  MCV 104.3*  --  113.4* 102.9*  < > 100.4* 101.1* 100.9* 100.2* 98.9  PLT 239  --  QUESTIONABLE RESULTS, RECOMMEND RECOLLECT TO VERIFY 236  < > 293 247 278 253 159  < > = values in this interval not displayed. Cardiac Enzymes:  Recent Labs Lab 12/19/16 2101  TROPONINI 1.21*   CBG:  Recent Labs Lab 12/19/16 1644 12/22/16 0803 12/24/16 1108  GLUCAP 119* 108* 148*    Iron Studies: No results for input(s): IRON, TIBC, TRANSFERRIN, FERRITIN in the last 72 hours. Studies/Results: Ct Head Wo Contrast  Result Date: 12/24/2016 CLINICAL DATA:   Intraparenchymal hemorrhage. EXAM: CT HEAD WITHOUT CONTRAST TECHNIQUE: Contiguous axial images were obtained from the base of the skull through the vertex without intravenous contrast. COMPARISON:  Multiple prior head CTs, most recently 12/22/2016 FINDINGS: Brain: The right frontal hemorrhage is stable in size. Evolving surrounding edema is noted. 3-4 mm of midline shift is not significantly changed. No new hemorrhage is present. The subarachnoid hemorrhage is not significantly changed. Vascular: Atherosclerotic calcifications are again noted. Skull: Calvarium is intact. Sinuses/Orbits: The paranasal sinuses and mastoid air cells are clear. IMPRESSION: 1. Stable right frontal hemorrhage with evolving surrounding edema. 2. Stable 3-4 mm of midline shift. Electronically Signed   By: San Morelle M.D.   On: 12/24/2016 12:22   Dg Chest Port 1 View  Result Date: 12/25/2016 CLINICAL DATA:  Intubation. EXAM: PORTABLE CHEST 1 VIEW COMPARISON:  07/29/2016 . FINDINGS: Endotracheal tube noted with its tip 4.1 cm above the carina. Heart size normal. No focal infiltrate. Prominent nipple shadows again noted. No pleural effusion or pneumothorax. IMPRESSION: 1.  Endotracheal tube tip noted 4.1 cm above the carina. 2. No acute cardiopulmonary disease. Electronically Signed   By: Marcello Moores  Register   On: 12/25/2016 07:14   Medications: Infusions: . sodium chloride 250 mL (12/25/16 0600)  . clevidipine Stopped (12/23/16 1100)  . dialysis solution 1.5% low-MG/low-CA    . levETIRAcetam Stopped (12/25/16 0957)  . propofol (DIPRIVAN) infusion Stopped (12/25/16 0805)    Scheduled Medications: . amLODipine  10 mg Oral Daily  . carvedilol  12.5 mg Oral BID WC  . chlorhexidine gluconate (MEDLINE KIT)  15 mL Mouth Rinse BID  . cloNIDine  0.1 mg Oral TID  . famotidine  20 mg Oral Daily  . gentamicin cream  1 application Topical Daily  . hydrALAZINE  25 mg Oral Q8H  . levothyroxine  44 mcg Intravenous Daily  .  losartan  100 mg Oral QHS  . mouth rinse  15 mL Mouth Rinse QID  . multivitamin  1 tablet Oral Daily  . pantoprazole (PROTONIX) IV  40 mg Intravenous Q12H  . senna-docusate  1 tablet Oral BID    have reviewed scheduled and prn medications.  Physical Exam: General: sedated but following commands with much prompting Heart: RRR Lungs: mostly clear  Abdomen: soft, PD cath in place Extremities: minimal edema Dialysis Access: PD cath and right upper ARM AVG     12/25/2016,11:32 AM  LOS: 6 days

## 2016-12-25 NOTE — Progress Notes (Signed)
STROKE TEAM PROGRESS NOTE   SUBJECTIVE (INTERVAL HISTORY) Patient drowsy, intubated, lying in bed, opens eyes to voice, follows commands. Patient did not tolerating HD yesterday because of cardiac arrest during dialysis. She was transferred back to ICU with intubation. So far tolerating weaning well since this morning. Nephrology plan to resume PD while she is inpt.    OBJECTIVE Temp:  [97.8 F (36.6 C)-98.4 F (36.9 C)] 98.3 F (36.8 C) (06/22 1130) Pulse Rate:  [58-87] 58 (06/22 1300) Cardiac Rhythm: Normal sinus rhythm (06/22 0800) Resp:  [13-29] 13 (06/22 1300) BP: (132-179)/(72-115) 132/72 (06/22 1300) SpO2:  [98 %-100 %] 99 % (06/22 1300) FiO2 (%):  [50 %] 50 % (06/22 0430) Weight:  [128 lb 15.5 oz (58.5 kg)] 128 lb 15.5 oz (58.5 kg) (06/22 0500)  CBC:   Recent Labs Lab 12/19/16 1851 12/20/16 0140  12/24/16 1238 12/25/16 0245  WBC 4.1 10.8*  < > 9.9 10.6*  NEUTROABS 3.6 9.0*  --   --   --   HGB 8.3* 17.9*  < > 18.0* 18.7*  HCT 29.7* 56.9*  < > 55.3* 55.9*  MCV 113.4* 102.9*  < > 100.2* 98.9  PLT 109* 236  < > 253 159  < > = values in this interval not displayed.  Basic Metabolic Panel:   Recent Labs Lab 12/24/16 0508 12/25/16 0245  NA 136 134*  K 5.5* 4.8  CL 100* 99*  CO2 19* 20*  GLUCOSE 87 122*  BUN 88* 65*  CREATININE 13.76* 10.94*  CALCIUM 8.8* 8.8*  MG 2.6* 2.4  PHOS 11.5* 7.5*    Lipid Panel:     Component Value Date/Time   CHOL 214 (H) 02/12/2015 0511   TRIG 126 12/25/2016 0801   HDL 83 02/12/2015 0511   CHOLHDL 2.6 02/12/2015 0511   VLDL 8 02/12/2015 0511   LDLCALC 123 (H) 02/12/2015 0511   HgbA1c: No results found for: HGBA1C Urine Drug Screen:     Component Value Date/Time   LABOPIA NONE DETECTED 12/19/2016 2051   COCAINSCRNUR NONE DETECTED 12/19/2016 2051   LABBENZ POSITIVE (A) 12/19/2016 2051   AMPHETMU NONE DETECTED 12/19/2016 2051   THCU NONE DETECTED 12/19/2016 2051   LABBARB NONE DETECTED 12/19/2016 2051    Alcohol Level  No results found for: [place] I have personally reviewed the radiological images below and agree with the radiology interpretations.  Ct Head Wo Contrast 12/24/2016 1. Stable right frontal hemorrhage with evolving surrounding edema. 2. Stable 3-4 mm of midline shift.  12/22/2016 IMPRESSION: Stable intraparenchymal hemorrhage. Subtle signs of midline shift, and slight increased pressure. But no significant worsening from priors.    12/22/2016 IMPRESSION: 1. No significant interval change in posterior right frontal intraparenchymal hematoma with extra-axial extension. Similar localized edema and mass effect with 3 mm right-to-left shift. 2. No other new acute intracranial process.   12/21/2016 1. Continued increase in size of right frontal parenchymal hemorrhage with extra-axial extension. 2. Slight increase in associated mass effect.  12/20/2016 1. Slightly increased size of right frontal intraparenchymal hematoma with unchanged right convexity subarachnoid blood, compared to head CTA performed 12/19/2016. Compared to the initial head CT from this presentation, obtained at 5:02 p.m. on 12/19/2016, the hematoma has increased in size.  2. No new hemorrhage, hydrocephalus or mass effect.   12/19/2016 1. Right posterior frontal lobe acute hemorrhage measuring up to 29 mm with small area of surrounding edema and local mass effect. No significant midline shift or herniation. Follow-up to ensure  resolution is recommended to exclude an underlying mass.  2. Small volume of subarachnoid hemorrhage overlying the right frontal convexity.  3. Hypoattenuation within subcortical white matter and bilateral parietal and occipital lobes. Findings are suggestive of PRES in the setting of hypertension. This can be further characterized with MRI of the brain.   Ct Angio Head W Or Wo Contrast Ct Angio Neck W And/or Wo Contrast 12/19/2016 1. No vascular explanation for the right frontal parietal hematoma.  Dural venous sinuses are patent and there is no evidence of vascular malformation.  2. The 16 cc hematoma is mildly increased from earlier CT. Negative for shift or herniation.  3. Fibromuscular dysplasia of the bilateral cervical ICA.  4. Moderate atherosclerosis for age. Negative for flow limiting stenosis.    Mr Brain Wo Contrast 12/19/2016 1. Findings of posterior reversible encephalopathy syndrome.  2. Size stable posterior right frontal hematoma compared to CTA 1 hour prior.   Ct Cervical Spine Wo Contrast 12/19/2016 No acute fracture or dislocation of the cervical spine.   Dg Swallowing Func-speech Pathology 12/20/2016 SLP Diet Recommendations Regular solids;Thin liquid Liquid Administration via Cup;Straw Medication Administration Whole meds with liquid.         EEG 12/21/2016 This awake and asleep EEG is abnormal due to diffuse slowing of the waking background.   PHYSICAL EXAM  Temp:  [97.8 F (36.6 C)-98.4 F (36.9 C)] 98.3 F (36.8 C) (06/22 1130) Pulse Rate:  [58-87] 58 (06/22 1300) Resp:  [13-29] 13 (06/22 1300) BP: (132-179)/(72-115) 132/72 (06/22 1300) SpO2:  [98 %-100 %] 99 % (06/22 1300) FiO2 (%):  [50 %] 50 % (06/22 0430) Weight:  [128 lb 15.5 oz (58.5 kg)] 128 lb 15.5 oz (58.5 kg) (06/22 0500)  General - Well nourished, well developed, intubated not on sedation   Ophthalmologic - Fundi not visualized due to noncooperation.  Cardiovascular - Regular rate and rhythm.  Neuro - intubated not on sedation, following all central and peripheral commands, right gaze preference, barely cross midline, PERRL, blinking to visual threat on the right, left facial droop, tongue midline. LUE and LLE withdraw on pain stimulation. RUE and RLE spontaneous movement, at least 3/5. Sensation symmetrical, not cooperative on coordination test, gait not tested.    ASSESSMENT/PLAN Terry Juarez is a 51 y.o. female with history of a renal cell carcinoma, hypertension, end-stage  renal disease on peritoneal dialysis, hyperlipidemia, and anemia presenting with seizure, left-sided weakness, and right frontal headache. She did not receive IV t-PA due to [place].  ICH: right frontal ICH with SAH in setting of hypertension with increase in hemorrhage size and MRI evidence of PRES.   Resultant left hemiplegia and left facial droop  CT head -  Right posterior frontal lobe acute hemorrhage measuring up to 29 mm with small area of surrounding edema and local mass effect.  MRI head - Findings of PRES.  CTA - No vascular explanation for the right frontal parietal hematoma. Fibromuscular dysplasia of the bilateral cervical ICA.   Repeat CT head x 2 showed slowing increase in hemorrhage size  Repeat CT x 2 without significant change  2D Echo - 07/30/2016 - EF 60-65%. No cardiac source of emboli identified.  EEG - diffuse slowing  VTE prophylaxis - SCDs Diet NPO time specified  No antithrombotic prior to admission, now on No antithrombotic due to hemorrhage  Ongoing aggressive stroke risk factor management  Therapy recommendations:  CIR  Disposition: pending  Hypertensive Emergency  BP fluctuate    worsening  hemorrhage and cerebral edema over the weekend  SBP goal to < 160.  Off cleviprex   Continue norvasc, coreg, clonidine, losartan  Add low dose hydralazine  Nephrology on board  Long-term BP goal normotensive  PRES  MRI confirmed PRES  Likely related to ESRD and hypertension  BP goal < 160   Close BP monitoring  ESRD on PD at home  Nephrology on board  Previously on PD  Attempted HD 12/22/16 - not tolerating well  Attempted HD 12/24/16 - cardiac arrest s/p intubation  Nephrology on board, plan for PD while inpt  Seizure on admission  Continue Keppra   No further seizure  EEG diffuse slowing  Other Active Problems  Hx renal cell cancer s/p R nephrectomy  Hyperkalemia   Anemia of CKD  TSH 15.736 - now on synthroid supplement;  s/p thyroidectomy 10/28/2016  Hospital day # 6  This patient is critically ill due to right frontal ICH with SAH, PRES, ESRD on PD, hypertensive emergency, cardiac arrest while on HD and at significant risk of neurological worsening, death form hematoma expansion, status epilepticus, encephalopathy, renal failure, hyperkalemia, cardiac arrest. This patient's care requires constant monitoring of vital signs, hemodynamics, respiratory and cardiac monitoring, review of multiple databases, neurological assessment, discussion with family, other specialists and medical decision making of high complexity. I spent 40 minutes of neurocritical care time in the care of this patient.   Rosalin Hawking, MD PhD Stroke Neurology 12/25/2016 9:32 AM    To contact Stroke Continuity provider, please refer to [email or web address] After hours, contact General Neurology

## 2016-12-25 NOTE — Progress Notes (Signed)
Patient has been weaning since 0815 and following commands with good volumes. Spoke with Nephrology > no plans for HD while inpatient. Orders placed to extubate.

## 2016-12-26 DIAGNOSIS — I619 Nontraumatic intracerebral hemorrhage, unspecified: Secondary | ICD-10-CM

## 2016-12-26 LAB — PHOSPHORUS: PHOSPHORUS: 10.1 mg/dL — AB (ref 2.5–4.6)

## 2016-12-26 LAB — MAGNESIUM: MAGNESIUM: 2.2 mg/dL (ref 1.7–2.4)

## 2016-12-26 LAB — BASIC METABOLIC PANEL
ANION GAP: 17 — AB (ref 5–15)
BUN: 59 mg/dL — AB (ref 6–20)
CALCIUM: 8.9 mg/dL (ref 8.9–10.3)
CO2: 21 mmol/L — AB (ref 22–32)
Chloride: 99 mmol/L — ABNORMAL LOW (ref 101–111)
Creatinine, Ser: 11.08 mg/dL — ABNORMAL HIGH (ref 0.44–1.00)
GFR calc Af Amer: 4 mL/min — ABNORMAL LOW (ref >60)
GFR, EST NON AFRICAN AMERICAN: 3 mL/min — AB (ref >60)
GLUCOSE: 113 mg/dL — AB (ref 65–99)
Potassium: 4.4 mmol/L (ref 3.5–5.1)
Sodium: 137 mmol/L (ref 135–145)

## 2016-12-26 LAB — CBC
HCT: 55.7 % — ABNORMAL HIGH (ref 36.0–46.0)
HEMOGLOBIN: 17.6 g/dL — AB (ref 12.0–15.0)
MCH: 32.4 pg (ref 26.0–34.0)
MCHC: 31.6 g/dL (ref 30.0–36.0)
MCV: 102.6 fL — ABNORMAL HIGH (ref 78.0–100.0)
Platelets: 218 10*3/uL (ref 150–400)
RBC: 5.43 MIL/uL — ABNORMAL HIGH (ref 3.87–5.11)
RDW: 15.8 % — AB (ref 11.5–15.5)
WBC: 10.3 10*3/uL (ref 4.0–10.5)

## 2016-12-26 NOTE — Progress Notes (Signed)
PULMONARY / CRITICAL CARE MEDICINE   Name: Terry Juarez MRN: [id number] DOB: 03-11-1966    ADMISSION DATE:  12/19/2016 CONSULTATION DATE: Dr Ashok Cordia  CHIEF COMPLAINT: ICH  BRIEF:  50yoF with history of ESRD on Peritoneal Dialysis, HTN, Right renal mass, and Anemia,   Presented to ER on 6/16 c/o acute left-sided weakness and right frontal HA. Head CT showed large 81mm right frontal hemorrhage with surrounding edema and mass effect, also with small SAH and signs of PRES. CTA Head/Neck showed no AVM's but but show the hematoma was mildly increased from the earlier imaging. Neurology saw patient in the ER and started Nicardipine and Keppra. Brain MRI showed no changes in the size of right frontal hematoma compared to CTA; it also confirmed PRES.    SUBJECTIVE:   Awake, just passed swallow eval.    VITAL SIGNS: BP (!) 158/85   Pulse 62   Temp 98.3 F (36.8 C) (Oral)   Resp 13   Ht 5\' 5"  (1.651 m)   Wt 123 lb 3.8 oz (55.9 kg)   SpO2 99%   BMI 20.51 kg/m   HEMODYNAMICS:  2 PIV's (18G and 20G in LUE) RUE AV fistula   INTAKE / OUTPUT: I/O last 3 completed shifts: In: 10748.8 [I.V.:423.8; Other:10010; IV Piggyback:315] Out: 09381 [Urine:250; Other:14830]  Physical Exam  Constitutional: She is oriented to person, place, and time. She appears well-developed. No distress.  HENT:  Head: Normocephalic and atraumatic.  Nose: Nose normal.  Mouth/Throat: Oropharynx is clear and moist.  Eyes: Conjunctivae are normal. Pupils are equal, round, and reactive to light. No scleral icterus.  Neck: Normal range of motion. Neck supple. No JVD present. No thyromegaly present.  Cardiovascular: Normal rate, regular rhythm and normal heart sounds.   Pulmonary/Chest: Effort normal and breath sounds normal. No stridor.  Abdominal: Soft. Bowel sounds are normal. She exhibits no distension. There is no tenderness.  Neurological: She is alert and oriented to person, place, and time. She exhibits  abnormal muscle tone.  Her left side is flaccid   Skin: Skin is warm and dry. Capillary refill takes less than 2 seconds. She is not diaphoretic.  Psychiatric: Judgment and thought content normal. Her affect is blunt. Her speech is delayed. She is slowed. Cognition and memory are normal.     LABS:  BMET  Recent Labs Lab 12/24/16 0508 12/25/16 0245 12/26/16 0443  NA 136 134* 137  K 5.5* 4.8 4.4  CL 100* 99* 99*  CO2 19* 20* 21*  BUN 88* 65* 59*  CREATININE 13.76* 10.94* 11.08*  GLUCOSE 87 122* 113*   Electrolytes  Recent Labs Lab 12/24/16 0508 12/25/16 0245 12/26/16 0443  CALCIUM 8.8* 8.8* 8.9  MG 2.6* 2.4 2.2  PHOS 11.5* 7.5* 10.1*   CBC  Recent Labs Lab 12/24/16 1238 12/25/16 0245 12/26/16 0443  WBC 9.9 10.6* 10.3  HGB 18.0* 18.7* 17.6*  HCT 55.3* 55.9* 55.7*  PLT 253 159 218   Coag's  Recent Labs Lab 12/19/16 1818 12/19/16 2020 12/21/16 1336  INR 0.97 1.88 0.93   Sepsis Markers  Recent Labs Lab 12/19/16 1658 12/19/16 2106  LATICACIDVEN 5.86* 1.54   ABG  Recent Labs Lab 12/20/16 0520 12/24/16 1310 12/25/16 0445  PHART 7.299* 7.393 7.521*  PCO2ART 36.0 33.2 25.1*  PO2ART 89.9 487* 171*    Liver Enzymes  Recent Labs Lab 12/19/16 1648 12/22/16 0742  AST 82*  --   ALT 21 14  ALKPHOS 43  --   BILITOT 0.9  --  ALBUMIN 3.6  --    Cardiac Enzymes  Recent Labs Lab 12/19/16 2101  TROPONINI 1.21*    Glucose  Recent Labs Lab 12/19/16 1644 12/22/16 0803 12/24/16 1108  GLUCAP 119* 108* 148*    Imaging No results found.  LINES/TUBES: Peritoneal dialysis catheter RUE Fistula LUE PIV's   ASSESSMENT / PLAN:    Respiratory Insufficiency following HD-->resolved. Extubated 6/22 ->seems as though she vagals w/ HD Plan Wean O2 Mobilize as able   ICH/SAH in setting of PRES -CT head 6/19 Stable, MRI with findings of PRES -Seizure on admission  Plan  Neuro following Will eventually need in-pt rehab Ask PT and OT  services to see  HTN emergency with PRES -ECHO 07/30/16 EF 60-65%  H/O HLD  Syncopal events +/- loss of pulse during attempts at HD Plan Cont tele  Cont Norvasc, clonidine coreg and PRN hydralazine  Cont losartan sbp goal < 160  (she just started getting her oral meds)  ESRD on PD-->not tolerating HD Metabolic acidosis > improving H/O renal cell cancer s/p right nephrectomy  Plan Cont peritoneal HD Replace lytes as needed  Polycythemia - consistent on serial CBC. Etiology unclear. Hgb down somewhat today Plan  Trend cbc Transfuse per protocol   Aspiration risk Plan Npo pending SLP eval  Hypothyroidism  Plan  Cont synthroid   FAMILY  - Updates: no family at bedside, patient updated on plan     50yoF with history of ESRD on Peritoneal Dialysis, HTN, Right renal mass, and Anemia, who was found down on the floor seizing today and found to have a large right frontal hemorrhage with surrounding edema and mass effect, also with small SAH and signs of PRES. Had what seems like vagal response w/ HD requiring intubation and escalated support Now stable and extubated -focus on pt/ot -bp control -routine rehab/post stroke measures -hope in-pt rehab -cont peritoneal dialysis; if needs HD again this could be an issue  Erick Colace ACNP-BC [place] Pager # [phone] OR # [phone] if no answer  Attending Note:  51 year old female with history of ESRD on peritoneal dialysis.  An attempt to transition her to HD has failed on multiple occasions due to cardiac disease.  On exam, the patient is alert and interactive with bibasilar crackles.  I reviewed CXR myself, no acute disease noted.  Discussed with PCCM-NP.  Hypotension: resolved  - IVF resuscitation as needed  Bradycardia with HD:  - Transition to HD  - Tele monitoring.  Seizure:  - Anti-epileptics as ordered  - Neuro following  ICH:  - Imaging as ordered.  Transfer to SDU and to [facility]  service with PCCM off 6/24.  Patient seen and examined, agree with above note.  I dictated the care and orders written for this patient under my direction.  Rush Farmer, [place]

## 2016-12-26 NOTE — Evaluation (Signed)
Clinical/Bedside Swallow Evaluation Patient Details  Name: Terry Juarez MRN: [id number] Date of Birth: December 10, 1965  Today's Date: 12/26/2016 Time: SLP Start Time (ACUTE ONLY): 0940 SLP Stop Time (ACUTE ONLY): 1000 SLP Time Calculation (min) (ACUTE ONLY): 20 min  Past Medical History:  Past Medical History:  Diagnosis Date  . Anemia   . Bruises easily   . Dialysis patient ([place])   . ESRD on dialysis ([place])   . Hyperlipidemia   . Hypertension   . Renal disorder    rt renal mass / < functioning of left kidney - being prepared for possible dialysis  . Right renal mass    Past Surgical History:  Past Surgical History:  Procedure Laterality Date  . AV FISTULA PLACEMENT Right 07/11/2014   Procedure: ARTERIOVENOUS (AV) FISTULA CREATION RIGHT ARM BRACHIO-CEPHALIC WITH ATTEMPTED RADIO-CEPHALIC (AV) FISTULA;  Surgeon: Mal Misty, MD;  Location: [place];  Service: Vascular;  Laterality: Right;  . COLONOSCOPY WITH PROPOFOL N/A 09/04/2016   Procedure: COLONOSCOPY WITH PROPOFOL;  Surgeon: Carol Ada, MD;  Location: [facility] ENDOSCOPY;  Service: Endoscopy;  Laterality: N/A;  . ECTOPIC PREGNANCY SURGERY  1987  . ESOPHAGOGASTRODUODENOSCOPY N/A 07/30/2016   Procedure: ESOPHAGOGASTRODUODENOSCOPY (EGD);  Surgeon: Carol Ada, MD;  Location[facility] END[facility]COPY;  Service: Endoscopy;  Laterality: N/A;  Bedside  . ESOPHAGOGASTRODUODENOSCOPY (EGD) WITH PROPOFOL N/A 09/04/2016   Procedure: ESOPHAGOGASTRODUODENOSCOPY (EGD) WITH PROPOFOL;  Surgeon: Carol Ada, MD;  Location: [facility] ENDOSCOPY;  Service: Endoscopy;  Laterality: N/A;  . INSERTION OF DIALYSIS CATHETER Right 07/11/2014   Procedure: INSERTION OF DIALYSIS CATHETER IN RIGHT INTERNAL JUGULAR ;  Surgeon: Mal Misty, MD;  Location: [place];  Service: Vascular;  Laterality: Right;  . LAPAROSCOPIC NEPHRECTOMY Right 07/25/2014   Procedure: RIGHT LAPAROSCOPIC RADICAL NEPHRECTOMY ;  Surgeon: Ardis Hughs, MD;  Location: [facility];  Service: Urology;  Laterality: Right;  .  PATCH ANGIOPLASTY Right 07/11/2014   Procedure: PATCH ANGIOPLASTY OF RIGHT RADIAL ARTERY USING CEPHALIC VEIN.;  Surgeon: Mal Misty, MD;  Location: [place];  Service: Vascular;  Laterality: Right;  . THROMBECTOMY W/ EMBOLECTOMY Right 07/11/2014   Procedure: THROMBECTOMY OF RIGHT RADIAL ARTERY  ;  Surgeon: Mal Misty, MD;  Location: [facility] OR;  Service: Vascular;  Laterality: Right;   HPI:  51 yo F with history of ESRD on Peritoneal Dialysis, HTN, Right renal mass, and Anemia, who was found down on the floor seizing. She was found to have a large right frontal hemorrhage with surrounding edema and mass effect, also with small SAH and signs of PRES. Pt had MBS 6/17 recommending regular diet and thin liquids. Patient's status declined requiring intubation on 6/21-6/22 and so SLP ordered to re-assess swallow function s/p this intubation.   Assessment / Plan / Recommendation Clinical Impression  Patient presents with a mild oropharyngeal dysphagia characterized by decreased mastication and oral control/manipulation with hard solids, oral residuals and left pocketing of solids, multiple swallows with thin liquids without overt s/s of aspiration or penetration. This SLP reviewed recent MBSS (12/20/16)which indicated patient was with normal swallow function.  SLP Visit Diagnosis: Dysphagia, oropharyngeal phase (R13.12)    Aspiration Risk  Mild aspiration risk    Diet Recommendation Dysphagia 3 (Mech soft);Thin liquid   Liquid Administration via: Cup;Straw Medication Administration: Whole meds with liquid Supervision: Patient able to self feed;Staff to assist with self feeding;Full supervision/cueing for compensatory strategies Compensations: Slow rate;Small sips/bites Postural Changes: Seated upright at 90 degrees    Other  Recommendations Oral Care Recommendations:  Oral care BID   Follow up Recommendations Inpatient Rehab      Frequency and Duration min 2x/week  1 week       Prognosis  Prognosis for Safe Diet Advancement: Good      Swallow Study   General Date of Onset: 12/24/16 HPI: 51 yo F with history of ESRD on Peritoneal Dialysis, HTN, Right renal mass, and Anemia, who was found down on the floor seizing. She was found to have a large right frontal hemorrhage with surrounding edema and mass effect, also with small SAH and signs of PRES. Pt had MBS 6/17 recommending regular diet and thin liquids. Patient's status declined requiring intubation on 6/21-6/22 and so SLP ordered to re-assess swallow function s/p this intubation. Type of Study: Bedside Swallow Evaluation Previous Swallow Assessment: MBSS Diet Prior to this Study: NPO Temperature Spikes Noted: No Respiratory Status: Room air History of Recent Intubation: Yes Length of Intubations (days): 2 days Date extubated: 12/25/16 Behavior/Cognition: Alert;Cooperative Oral Cavity Assessment: Within Functional Limits Oral Care Completed by SLP: No Oral Cavity - Dentition: Adequate natural dentition Self-Feeding Abilities: Needs assist Patient Positioning: Upright in bed Baseline Vocal Quality: Hoarse (mildly hoarse) Volitional Cough: Weak Volitional Swallow: Able to elicit    Oral/Motor/Sensory Function Overall Oral Motor/Sensory Function: Mild impairment Facial Symmetry: Within Functional Limits Facial Strength: Reduced left Lingual Symmetry: Within Functional Limits Lingual Strength: Reduced Velum: Within Functional Limits Mandible: Within Functional Limits   Ice Chips Ice chips: Not tested   Thin Liquid Thin Liquid: Impaired Presentation: Cup;Straw Pharyngeal  Phase Impairments: Multiple swallows    Nectar Thick Nectar Thick Liquid: Not tested   Honey Thick Honey Thick Liquid: Not tested   Puree Puree: Within functional limits Presentation: Spoon   Solid   GO   Solid: Impaired Oral Phase Impairments: Impaired mastication Oral Phase Functional Implications: Left lateral sulci pocketing;Oral  residue Pharyngeal Phase Impairments: Suspected delayed [place], [place], CCC-SLP 12/26/16 1:02 PM

## 2016-12-26 NOTE — Progress Notes (Signed)
Subjective:  Patient extubated yest- tolerating well  - completed PD overnight no issues Objective Vital signs in last 24 hours: Vitals:   12/26/16 0530 12/26/16 0600 12/26/16 0630 12/26/16 0700  BP: (!) 187/80 (!) 147/79 (!) 162/88 (!) 146/85  Pulse: (!) 56 70 (!) 59 62  Resp: 15 18 14 14   Temp:      TempSrc:      SpO2: 98% 99% 99% 99%  Weight:      Height:       Weight change: 0.7 kg (1 lb 8.7 oz)  Intake/Output Summary (Last 24 hours) at 12/26/16 9233 Last data filed at 12/26/16 0600  Gross per 24 hour  Intake            10390 ml  Output            15080 ml  Net            -4690 ml    Assessment/ Plan: Pt is a 51 y.o. yo female with ESRD who was admitted on 12/19/2016 with sz/hemiparesis- found to have right sided frontal lobe hemorrhage Assessment/Plan: 1. Intracranial hemorrhage- did not require surgery /PRES also present- was making some clinical recovery but now s/p episode of unresponsiveness "code" on hemodialysis 6/22.  Ultimately planning for rehab 2. ESRD - normally on PD as OP- thought to change to HD because unsure if she could continue to do PD at home given defecits. However, clearly is NOT TOLERATING HD- unclear exactly why.  We will be able to do her PD for her while she is at [facility] so will plan to do that for now.  If is looking like she cannot do as OP in the future we will challenge her again with HD but much in the fulture 3. Anemia- history of GIB and low hgb- has responded remarkable well to ESA and that is part of reason for hypertension 4. Secondary hyperparathyroidism- resumed phoslo, calcitriol and sensipar  5. HTN/volume- goal for higher BP- previously on cleviprex.  Now coreg 12.5 BID, amlodipine 10 daily and cozaar 100 daily, clonidine 0.3 daily, hydralazine 25 TID with prns of hydralazine and labetalol.  She is usually on 3 drugs, not 5- may be able to simplify reg at some point- using 1.5 % fluid for now  [place]: Basic  Metabolic Panel:  Recent Labs Lab 12/24/16 0508 12/25/16 0245 12/26/16 0443  NA 136 134* 137  K 5.5* 4.8 4.4  CL 100* 99* 99*  CO2 19* 20* 21*  GLUCOSE 87 122* 113*  BUN 88* 65* 59*  CREATININE 13.76* 10.94* 11.08*  CALCIUM 8.8* 8.8* 8.9  PHOS 11.5* 7.5* 10.1*   Liver Function Tests:  Recent Labs Lab 12/19/16 1648 12/22/16 0742  AST 82*  --   ALT 21 14  ALKPHOS 43  --   BILITOT 0.9  --   PROT 6.4*  --   ALBUMIN 3.6  --    No results for input(s): LIPASE, AMYLASE in the last 168 hours.  Recent Labs Lab 12/22/16 1048  AMMONIA 38*   CBC:  Recent Labs Lab 12/19/16 1648  12/19/16 1851 12/20/16 0140  12/23/16 0253 12/24/16 0508 12/24/16 1238 12/25/16 0245 12/26/16 0443  WBC 9.1  --  4.1 10.8*  < > 7.6 7.9 9.9 10.6* 10.3  NEUTROABS 6.6  --  3.6 9.0*  --   --   --   --   --   --   HGB 18.7*  < >  8.3* 17.9*  < > 17.4* 17.7* 18.0* 18.7* 17.6*  HCT 58.6*  < > 29.7* 56.9*  < > 53.4* 55.0* 55.3* 55.9* 55.7*  MCV 104.3*  --  113.4* 102.9*  < > 101.1* 100.9* 100.2* 98.9 102.6*  PLT 239  --  109* 236  < > 247 278 253 159 218  < > = values in this interval not displayed. Cardiac Enzymes:  Recent Labs Lab 12/19/16 2101  TROPONINI 1.21*   CBG:  Recent Labs Lab 12/19/16 1644 12/22/16 0803 12/24/16 1108  GLUCAP 119* 108* 148*    Iron Studies: No results for input(s): IRON, TIBC, TRANSFERRIN, FERRITIN in the last 72 hours. Studies/Results: Ct Head Wo Contrast  Result Date: 12/24/2016 CLINICAL DATA:  Intraparenchymal hemorrhage. EXAM: CT HEAD WITHOUT CONTRAST TECHNIQUE: Contiguous axial images were obtained from the base of the skull through the vertex without intravenous contrast. COMPARISON:  Multiple prior head CTs, most recently 12/22/2016 FINDINGS: Brain: The right frontal hemorrhage is stable in size. Evolving surrounding edema is noted. 3-4 mm of midline shift is not significantly changed. No new hemorrhage is present. The subarachnoid hemorrhage is not  significantly changed. Vascular: Atherosclerotic calcifications are again noted. Skull: Calvarium is intact. Sinuses/Orbits: The paranasal sinuses and mastoid air cells are clear. IMPRESSION: 1. Stable right frontal hemorrhage with evolving surrounding edema. 2. Stable 3-4 mm of midline shift. Electronically Signed   By: San Morelle M.D.   On: 12/24/2016 12:22   Dg Chest Port 1 View  Result Date: 12/25/2016 CLINICAL DATA:  Intubation. EXAM: PORTABLE CHEST 1 VIEW COMPARISON:  07/29/2016 . FINDINGS: Endotracheal tube noted with its tip 4.1 cm above the carina. Heart size normal. No focal infiltrate. Prominent nipple shadows again noted. No pleural effusion or pneumothorax. IMPRESSION: 1.  Endotracheal tube tip noted 4.1 cm above the carina. 2. No acute cardiopulmonary disease. Electronically Signed   By: [place]   On: 12/25/2016 07:14   Medications: Infusions: . sodium chloride 250 mL (12/25/16 2257)  . clevidipine Stopped (12/23/16 1100)  . dialysis solution 1.5% low-MG/low-CA    . levETIRAcetam Stopped (12/25/16 2116)    Scheduled Medications: . amLODipine  10 mg Oral Daily  . calcitRIOL  0.25 mcg Oral Q M,W,F-1800  . calcium acetate  1,334 mg Oral TID WC  . carvedilol  12.5 mg Oral BID WC  . cinacalcet  60 mg Oral Q supper  . cloNIDine  0.1 mg Oral TID  . famotidine  20 mg Oral Daily  . gentamicin cream  1 application Topical Daily  . hydrALAZINE  25 mg Oral Q8H  . levothyroxine  44 mcg Intravenous Daily  . losartan  100 mg Oral QHS  . mouth rinse  15 mL Mouth Rinse BID  . multivitamin  1 tablet Oral Daily  . pantoprazole (PROTONIX) IV  40 mg Intravenous Q12H  . senna-docusate  1 tablet Oral BID    have reviewed scheduled and prn medications.  Physical Exam: General: still sleeping this AM Heart: RRR Lungs: mostly clear Abdomen: soft, PD cath in place Extremities: minimal edema Dialysis Access: PD cath and right upper ARM AVG     12/26/2016,8:07 AM  LOS: 7  days

## 2016-12-26 NOTE — Progress Notes (Signed)
STROKE TEAM PROGRESS NOTE   SUBJECTIVE (INTERVAL HISTORY) Patient Extubated yesterday, lethargic, lying in bed, opens eyes to voice, follows commands. Patient tolerating PD well yesterday.   OBJECTIVE Temp:  [97.8 F (36.6 C)-98.6 F (37 C)] 98.6 F (37 C) (06/23 1200) Pulse Rate:  [56-85] 64 (06/23 1300) Cardiac Rhythm: Normal sinus rhythm (06/23 0714) Resp:  [11-18] 14 (06/23 1300) BP: (121-188)/(76-103) 143/80 (06/23 1300) SpO2:  [98 %-100 %] 99 % (06/23 1300) Weight:  [121 lb 11.1 oz (55.2 kg)-123 lb 3.8 oz (55.9 kg)] 121 lb 11.1 oz (55.2 kg) (06/23 0920)  CBC:   Recent Labs Lab 12/19/16 1851 12/20/16 0140  12/25/16 0245 12/26/16 0443  WBC 4.1 10.8*  < > 10.6* 10.3  NEUTROABS 3.6 9.0*  --   --   --   HGB 8.3* 17.9*  < > 18.7* 17.6*  HCT 29.7* 56.9*  < > 55.9* 55.7*  MCV 113.4* 102.9*  < > 98.9 102.6*  PLT 109* 236  < > 159 218  < > = values in this interval not displayed.  Basic Metabolic Panel:   Recent Labs Lab 12/25/16 0245 12/26/16 0443  NA 134* 137  K 4.8 4.4  CL 99* 99*  CO2 20* 21*  GLUCOSE 122* 113*  BUN 65* 59*  CREATININE 10.94* 11.08*  CALCIUM 8.8* 8.9  MG 2.4 2.2  PHOS 7.5* 10.1*    Lipid Panel:     Component Value Date/Time   CHOL 214 (H) 02/12/2015 0511   TRIG 126 12/25/2016 0801   HDL 83 02/12/2015 0511   CHOLHDL 2.6 02/12/2015 0511   VLDL 8 02/12/2015 0511   LDLCALC 123 (H) 02/12/2015 0511   HgbA1c: No results found for: HGBA1C Urine Drug Screen:     Component Value Date/Time   LABOPIA NONE DETECTED 12/19/2016 2051   COCAINSCRNUR NONE DETECTED 12/19/2016 2051   LABBENZ POSITIVE (A) 12/19/2016 2051   AMPHETMU NONE DETECTED 12/19/2016 2051   THCU NONE DETECTED 12/19/2016 2051   LABBARB NONE DETECTED 12/19/2016 2051    Alcohol Level No results found for: [place] I have personally reviewed the radiological images below and agree with the radiology interpretations.  Ct Head Wo Contrast 12/24/2016 1. Stable right  frontal hemorrhage with evolving surrounding edema. 2. Stable 3-4 mm of midline shift.  12/22/2016 IMPRESSION: Stable intraparenchymal hemorrhage. Subtle signs of midline shift, and slight increased pressure. But no significant worsening from priors.    12/22/2016 IMPRESSION: 1. No significant interval change in posterior right frontal intraparenchymal hematoma with extra-axial extension. Similar localized edema and mass effect with 3 mm right-to-left shift. 2. No other new acute intracranial process.   12/21/2016 1. Continued increase in size of right frontal parenchymal hemorrhage with extra-axial extension. 2. Slight increase in associated mass effect.  12/20/2016 1. Slightly increased size of right frontal intraparenchymal hematoma with unchanged right convexity subarachnoid blood, compared to head CTA performed 12/19/2016. Compared to the initial head CT from this presentation, obtained at 5:02 p.m. on 12/19/2016, the hematoma has increased in size.  2. No new hemorrhage, hydrocephalus or mass effect.   12/19/2016 1. Right posterior frontal lobe acute hemorrhage measuring up to 29 mm with small area of surrounding edema and local mass effect. No significant midline shift or herniation. Follow-up to ensure resolution is recommended to exclude an underlying mass.  2. Small volume of subarachnoid hemorrhage overlying the right frontal convexity.  3. Hypoattenuation within subcortical white matter and bilateral parietal and occipital lobes. Findings are suggestive  of PRES in the setting of hypertension. This can be further characterized with MRI of the brain.   Ct Angio Head W Or Wo Contrast Ct Angio Neck W And/or Wo Contrast 12/19/2016 1. No vascular explanation for the right frontal parietal hematoma. Dural venous sinuses are patent and there is no evidence of vascular malformation.  2. The 16 cc hematoma is mildly increased from earlier CT. Negative for shift or herniation.  3. Fibromuscular  dysplasia of the bilateral cervical ICA.  4. Moderate atherosclerosis for age. Negative for flow limiting stenosis.    Mr Brain Wo Contrast 12/19/2016 1. Findings of posterior reversible encephalopathy syndrome.  2. Size stable posterior right frontal hematoma compared to CTA 1 hour prior.   Ct Cervical Spine Wo Contrast 12/19/2016 No acute fracture or dislocation of the cervical spine.   Dg Swallowing Func-speech Pathology 12/20/2016 SLP Diet Recommendations Regular solids;Thin liquid Liquid Administration via Cup;Straw Medication Administration Whole meds with liquid.         EEG 12/21/2016 This awake and asleep EEG is abnormal due to diffuse slowing of the waking background.   PHYSICAL EXAM  Temp:  [97.8 F (36.6 C)-98.6 F (37 C)] 98.6 F (37 C) (06/23 1200) Pulse Rate:  [56-85] 64 (06/23 1300) Resp:  [11-18] 14 (06/23 1300) BP: (121-188)/(76-103) 143/80 (06/23 1300) SpO2:  [98 %-100 %] 99 % (06/23 1300) Weight:  [121 lb 11.1 oz (55.2 kg)-123 lb 3.8 oz (55.9 kg)] 121 lb 11.1 oz (55.2 kg) (06/23 0920)  General - Well nourished, well developed, very lethargic   Ophthalmologic - Fundi not visualized due to noncooperation.  Cardiovascular - Regular rate and rhythm.  Neuro - lethargic, following all central and peripheral commands, right gaze preference, barely cross midline, PERRL, blinking to visual threat on the right, left facial droop, tongue midline. LUE and LLE withdraw on pain stimulation. RUE and RLE spontaneous movement, at least 3/5. Sensation symmetrical, not cooperative on coordination test, gait not tested.    ASSESSMENT/PLAN [name] is a 51 y.o. female with history of a renal cell carcinoma, hypertension, end-stage renal disease on peritoneal dialysis, hyperlipidemia, and anemia presenting with seizure, left-sided weakness, and right frontal headache. She did not receive IV t-PA due to [place].  ICH: right frontal ICH with SAH in setting of hypertension  with increase in hemorrhage size and MRI evidence of PRES.   Resultant left hemiplegia and left facial droop  CT head -  Right posterior frontal lobe acute hemorrhage measuring up to 29 mm with small area of surrounding edema and local mass effect.  MRI head - Findings of PRES.  CTA - No vascular explanation for the right frontal parietal hematoma. Fibromuscular dysplasia of the bilateral cervical ICA.   Repeat CT head x 2 showed slowing increase in hemorrhage size  Repeat CT x 2 without significant change  2D Echo - 07/30/2016 - EF 60-65%. No cardiac source of emboli identified.  EEG - diffuse slowing  VTE prophylaxis - SCDs DIET DYS 3 Room service appropriate? Yes; Fluid consistency: Thin  No antithrombotic prior to admission, now on No antithrombotic due to hemorrhage  Ongoing aggressive stroke risk factor management  Therapy recommendations:  CIR  Disposition: pending  Hypertensive Emergency  BP fluctuate    worsening hemorrhage and cerebral edema over the weekend  SBP goal to < 160.  Off cleviprex   Continue norvasc, coreg, clonidine, losartan, low dose hydralazine  Nephrology on board   Long-term BP goal normotensive  PRES  MRI confirmed PRES  Likely related to ESRD and hypertension  BP goal < 160   Close BP monitoring  ESRD on PD at home  Nephrology on board  Previously on PD  Attempted HD 12/22/16 - not tolerating well  Attempted HD 12/24/16 - cardiac arrest s/p intubation  Back to PD, tolerating well  Seizure on admission  Continue Keppra   No further seizure  EEG diffuse slowing  Other Active Problems  Hx renal cell cancer s/p R nephrectomy  Hyperkalemia - resolved after PD  Anemia of CKD  TSH 15.736 - now on synthroid supplement; s/p thyroidectomy 10/28/2016  Hospital day # 7  This patient is critically ill due to right frontal ICH with SAH, PRES, ESRD on PD, hypertensive emergency, cardiac arrest while on HD and at  significant risk of neurological worsening, death form hematoma expansion, status epilepticus, encephalopathy, renal failure, hyperkalemia, cardiac arrest. This patient's care requires constant monitoring of vital signs, hemodynamics, respiratory and cardiac monitoring, review of multiple databases, neurological assessment, discussion with family, other specialists and medical decision making of high complexity. I spent 35 minutes of neurocritical care time in the care of this patient.   Rosalin Hawking, MD PhD Stroke Neurology 12/26/2016 3:13 PM   To contact Stroke Continuity provider, please refer to [email or web address] After hours, contact General Neurology

## 2016-12-27 DIAGNOSIS — D751 Secondary polycythemia: Secondary | ICD-10-CM

## 2016-12-27 DIAGNOSIS — I610 Nontraumatic intracerebral hemorrhage in hemisphere, subcortical: Secondary | ICD-10-CM

## 2016-12-27 DIAGNOSIS — C641 Malignant neoplasm of right kidney, except renal pelvis: Secondary | ICD-10-CM

## 2016-12-27 DIAGNOSIS — E038 Other specified hypothyroidism: Secondary | ICD-10-CM

## 2016-12-27 LAB — RENAL FUNCTION PANEL
Albumin: 2.6 g/dL — ABNORMAL LOW (ref 3.5–5.0)
Anion gap: 16 — ABNORMAL HIGH (ref 5–15)
BUN: 60 mg/dL — AB (ref 6–20)
CHLORIDE: 99 mmol/L — AB (ref 101–111)
CO2: 22 mmol/L (ref 22–32)
Calcium: 9.1 mg/dL (ref 8.9–10.3)
Creatinine, Ser: 11.78 mg/dL — ABNORMAL HIGH (ref 0.44–1.00)
GFR calc Af Amer: 4 mL/min — ABNORMAL LOW (ref >60)
GFR, EST NON AFRICAN AMERICAN: 3 mL/min — AB (ref >60)
Glucose, Bld: 116 mg/dL — ABNORMAL HIGH (ref 65–99)
POTASSIUM: 4.7 mmol/L (ref 3.5–5.1)
Phosphorus: 9.5 mg/dL — ABNORMAL HIGH (ref 2.5–4.6)
Sodium: 137 mmol/L (ref 135–145)

## 2016-12-27 MED ORDER — LEVOTHYROXINE SODIUM 88 MCG PO TABS
88.0000 ug | ORAL_TABLET | Freq: Every day | ORAL | Status: DC
Start: 2016-12-27 — End: 2017-01-04
  Administered 2016-12-27 – 2017-01-04 (×9): 88 ug via ORAL
  Filled 2016-12-27 (×9): qty 1

## 2016-12-27 MED ORDER — PANTOPRAZOLE SODIUM 40 MG PO TBEC
40.0000 mg | DELAYED_RELEASE_TABLET | Freq: Two times a day (BID) | ORAL | Status: DC
  Administered 2016-12-27 – 2017-01-04 (×17): 40 mg via ORAL
  Filled 2016-12-27 (×17): qty 1

## 2016-12-27 NOTE — Progress Notes (Signed)
PROGRESS NOTE    Terry Juarez  JSH:[id number] DOB: September 07, 1965 DOA: 12/19/2016 PCP: [name], MD   Brief Narrative:  51yo BF PMHx ESRD on Peritoneal Dialysis, Renal Cell cancer s/p Rt nephrectomy, HTN, HLD, and Anemia, who was last seen in her normal state of health today at 11am.   She was found down on the floor seizing later in the day by a person who rents a room from her. She was brought to the ER at approx 4:45pm at which time patient c/o acute left-sided weakness and right frontal HA. Head CT showed large 34mm right frontal hemorrhage with surrounding edema and mass effect, also with small SAH and signs of PRES. CTA Head/Neck showed no AVM's but but show the hematoma was mildly increased from the earlier imaging. Neurology saw patient in the ER and started Nicardipine and Keppra. She was also found to have hyperkalemia which was medically treated. Brain MRI showed no changes in the size of right frontal hematoma compared to CTA; it also confirmed PRES.  On my interview of patient, she c/o bilateral HA. She says her last PD session was last night and that it was a full session. She says her peritoneal fluid is clear. She was previously on hemodialysis but converted to PD because she wanted to be able to work during the day. She has a RUE fistula with a good thrill. She says her BP at home normally runs in 180's but that recently its been running higher. She says she has been compliant with her BP medications and last took them last night.    Subjective: 6/24 A/O 4, negative CP, negative SOB. States has had headaches since admission but currently pain-free. Negative blurred vision/diplopia.    Assessment & Plan:   Active Problems:   Intracranial hemorrhage (HCC)   Subarachnoid hemorrhage   Intracerebral hemorrhage   ICH (intracerebral hemorrhage) (HCC)   Seizure ([place])   PRES (posterior reversible encephalopathy syndrome)   Benign essential HTN   Cardiac arrest, cause  unspecified ([place])   Acute respiratory failure with hypoxemia (HCC)   Acute encephalopathy  Acute Respiratory failure with hypoxia -Multifactorial to include SAH, hypertensive emergency, seizures -Resolved patient on room air -Titrate O2 to maintain SPO2 > 93%   Seizure/ICH/SAH in setting of PRES -CT head 6/19 Stable, MRI with findings of PRES -Seizure on admission  -Neuro has signed off -Out of the chair Q shift -CIR vs neuro SNF  HTN emergency with PRES -ECHO 07/30/16 EF 60-65%  -SBP goal<160 -Amlodipine 10 mg daily -Coreg 12.5 mg BID -Clonidine 0.1 mg TID -Hydralazine 25 mg TID -Losartan 100 mg daily  Cardiac arrest -Short cardiac arrest 30 seconds with attempt at HD -In the future would not attempt further HD.  ESRD on PD-->not tolerating HD -PD per nephrology  H/O renal cell cancer s/p right nephrectomy  -Cont peritoneal HD  Polycythemia  - consistent on serial CBC. Etiology unclear. Hgb down somewhat today -Trend cbc Recent Labs Lab 12/22/16 1048 12/23/16 0253 12/24/16 0508 12/24/16 1238 12/25/16 0245 12/26/16 0443  HGB 18.1* 17.4* 17.7* 18.0* 18.7* 17.6*   Hypothyroidism  -Cont synthroid 88 g daily    Plan of care -PT /OT consult placed evaluate for CIR vs Neuro SNF capable of PD. Will probably be difficult placement   DVT prophylaxis: SCD: No anticoagulant secondary to [facility] Code Status: Full Family Communication: Family at bedside discussed finding CIR vs neuro SNF patient Disposition Plan: TBD   Consultants:  [facility] M Dr.  Jennet Maduro, Nephrology Dr. [name] Neurology Dr.Jindong, Erlinda Hong,      Procedures/Significant Events:  6/16 CT head wo Contrast/CT C-spine:-Right posterior frontal lobe acute hemorrhage measuring up to 29 mm with small area of surrounding edema and local mass effect.  -No significant midline shift or herniation.  -Small volume of subarachnoid hemorrhage overlying the right frontal convexity. -Findings are  suggestive of PRES  -No acute fracture or dislocation of the cervical spine. 6/16 CT head/neck: Rt frontal parietal hematoma. Dural venous sinuses are patent and there is no evidence of vascular malformation. -16 cc hematoma is mildly increased from earlier CT. -Fibromuscular dysplasia of the bilateral cervical ICA. 6/16 MRI brain WO contrast:Findings of posterior reversible encephalopathy syndrome. 6/17 CT head WO contrast:-Slightly increased size of right frontal intraparenchymal hematoma with unchanged right convexity subarachnoid blood, 6/17 MBS: Regular solids thin liquid 6/17 CT head Wo contrast:-Continued increase in size of right frontal parenchymal hemorrhage with extra-axial extension. -Slight increase in associated mass effect. 6/18 IHK:VQQV awake and asleepEEG is abnormal due to diffuse slowing of the waking background 6/19 CT head WO contrast: No significant change  6/19 Attempted HD  6/21 Attempted HD--> cardiac arrest 6/21 intubation>>> 6/21 CT head Wo contrast: Stable right frontal hemorrhage with evolving surrounding edema -Stable 3-4 mm midline shift    VENTILATOR SETTINGS: None   Cultures None  Antimicrobials: Anti-infectives    None       Devices    LINES / TUBES:  Peritoneal dialysis catheter RUE Fistula LUE PIV's    Continuous Infusions: . sodium chloride Stopped (12/26/16 1730)  . dialysis solution 1.5% low-MG/low-CA    . levETIRAcetam Stopped (12/26/16 2252)     Objective: Vitals:   12/27/16 0500 12/27/16 0724 12/27/16 0801 12/27/16 0805  BP:  (!) 166/85  (!) 146/96  Pulse:  66 73 73  Resp:  13  17  Temp:  98.7 F (37.1 C)  98 F (36.7 C)  TempSrc:  Axillary  Axillary  SpO2:  93%  94%  Weight: 124 lb 9 oz (56.5 kg)   125 lb 3.5 oz (56.8 kg)  Height:        Intake/Output Summary (Last 24 hours) at 12/27/16 0830 Last data filed at 12/26/16 1800  Gross per 24 hour  Intake            10146 ml  Output            10849 ml  Net              -703 ml   Filed Weights   12/26/16 1909 12/27/16 0500 12/27/16 0805  Weight: 119 lb 11.4 oz (54.3 kg) 124 lb 9 oz (56.5 kg) 125 lb 3.5 oz (56.8 kg)    Examination:  General: A/O 4, No acute respiratory distress Eyes: negative scleral hemorrhage, negative anisocoria, negative icterus ENT: Negative Runny nose, negative gingival bleeding, Neck:  Negative scars, masses, torticollis, lymphadenopathy, JVD Lungs: Clear to auscultation bilaterally without wheezes or crackles Cardiovascular: Regular rate and rhythm without murmur gallop or rub normal S1 and S2 Abdomen: negative abdominal pain, nondistended, positive soft, bowel sounds, no rebound, no ascites, no appreciable mass Extremities: No significant cyanosis, clubbing, or edema bilateral lower extremities Skin: Negative rashes, lesions, ulcers Psychiatric:  Negative depression, negative anxiety, negative fatigue, negative mania  Central nervous system:  Cranial nerves II through XII intact, tongue/uvula midline,  left hemiparesis, R UE/RLE strength 5/5, sensation intact throughout,  passpointing with right  nose finger quick finger touch right  abdomen  normal limits,  left facial droop, negative dysarthria, negative expressive aphasia, negative receptive aphasia.  .     Data Reviewed: Care during the described time interval was provided by me .  I have reviewed this patient's available data, including medical history, events of note, physical examination, and all test results as part of my evaluation. I have personally reviewed and interpreted all radiology studies.  CBC:  Recent Labs Lab 12/23/16 0253 12/24/16 0508 12/24/16 1238 12/25/16 0245 12/26/16 0443  WBC 7.6 7.9 9.9 10.6* 10.3  HGB 17.4* 17.7* 18.0* 18.7* 17.6*  HCT 53.4* 55.0* 55.3* 55.9* 55.7*  MCV 101.1* 100.9* 100.2* 98.9 102.6*  PLT 247 278 253 159 481   Basic Metabolic Panel:  Recent Labs Lab 12/23/16 0253 12/24/16 0508 12/25/16 0245  12/26/16 0443 12/27/16 0248  NA 137 136 134* 137 137  K 5.2* 5.5* 4.8 4.4 4.7  CL 100* 100* 99* 99* 99*  CO2 21* 19* 20* 21* 22  GLUCOSE 78 87 122* 113* 116*  BUN 71* 88* 65* 59* 60*  CREATININE 12.52* 13.76* 10.94* 11.08* 11.78*  CALCIUM 8.5* 8.8* 8.8* 8.9 9.1  MG  --  2.6* 2.4 2.2  --   PHOS  --  11.5* 7.5* 10.1* 9.5*   GFR: Estimated Creatinine Clearance: 5.1 mL/min (A) (by C-G formula based on SCr of 11.78 mg/dL (H)). Liver Function Tests:  Recent Labs Lab 12/22/16 0742 12/27/16 0248  ALT 14  --   ALBUMIN  --  2.6*   No results for input(s): LIPASE, AMYLASE in the last 168 hours.  Recent Labs Lab 12/22/16 1048  AMMONIA 38*   Coagulation Profile:  Recent Labs Lab 12/21/16 1336  INR 0.93   Cardiac Enzymes: No results for input(s): CKTOTAL, CKMB, CKMBINDEX, TROPONINI in the last 168 hours. BNP (last 3 results) No results for input(s): PROBNP in the last 8760 hours. HbA1C: No results for input(s): HGBA1C in the last 72 hours. CBG:  Recent Labs Lab 12/22/16 0803 12/24/16 1108  GLUCAP 108* 148*   Lipid Profile:  Recent Labs  12/25/16 0245 12/25/16 0801  TRIG 1,127* 126   Thyroid Function Tests: No results for input(s): TSH, T4TOTAL, FREET4, T3FREE, THYROIDAB in the last 72 hours. Anemia Panel: No results for input(s): VITAMINB12, FOLATE, FERRITIN, TIBC, IRON, RETICCTPCT in the last 72 hours. Urine analysis:    Component Value Date/Time   COLORURINE STRAW (A) 07/31/2016 0637   APPEARANCEUR CLEAR 07/31/2016 0637   LABSPEC 1.009 07/31/2016 0637   PHURINE 6.0 07/31/2016 0637   GLUCOSEU NEGATIVE 07/31/2016 0637   HGBUR MODERATE (A) 07/31/2016 0637   BILIRUBINUR NEGATIVE 07/31/2016 0637   KETONESUR NEGATIVE 07/31/2016 0637   PROTEINUR 30 (A) 07/31/2016 0637   UROBILINOGEN 0.2 05/15/2014 1717   NITRITE NEGATIVE 07/31/2016 0637   LEUKOCYTESUR NEGATIVE 07/31/2016 0637   Sepsis Labs: @LABRCNTIP (procalcitonin:4,lacticidven:4)  ) Recent Results  (from the past 240 hour(s))  MRSA PCR Screening     Status: None   Collection Time: 12/20/16  1:50 AM  Result Value Ref Range Status   MRSA by PCR NEGATIVE NEGATIVE Final    Comment:        The GeneXpert MRSA Assay (FDA approved for NASAL specimens only), is one component of a comprehensive MRSA colonization surveillance program. It is not intended to diagnose MRSA infection nor to guide or monitor treatment for MRSA infections.          Radiology Studies: No results found.      Scheduled Meds: .  amLODipine  10 mg Oral Daily  . calcitRIOL  0.25 mcg Oral Q M,W,F-1800  . calcium acetate  1,334 mg Oral TID WC  . carvedilol  12.5 mg Oral BID WC  . cinacalcet  60 mg Oral Q supper  . cloNIDine  0.1 mg Oral TID  . famotidine  20 mg Oral Daily  . gentamicin cream  1 application Topical Daily  . hydrALAZINE  25 mg Oral Q8H  . levothyroxine  88 mcg Oral QAC breakfast  . losartan  100 mg Oral QHS  . multivitamin  1 tablet Oral Daily  . pantoprazole  40 mg Oral BID  . senna-docusate  1 tablet Oral BID   Continuous Infusions: . sodium chloride Stopped (12/26/16 1730)  . dialysis solution 1.5% low-MG/low-CA    . levETIRAcetam Stopped (12/26/16 2252)     LOS: 8 days    Time spent: 40 minutes    Finnick Orosz, Geraldo Docker, MD [facility] Pager [phone]   If 7PM-7AM, please contact night-coverage [email or web address] Password [email or web address] 12/27/2016, 8:30 AM

## 2016-12-27 NOTE — Progress Notes (Signed)
Subjective:   completed PD overnight no issues- BP a little higher Objective Vital signs in last 24 hours: Vitals:   12/27/16 0724 12/27/16 0801 12/27/16 0805 12/27/16 0912  BP: (!) 166/85  (!) 146/96 (!) 153/93  Pulse: 66 73 73   Resp: 13  17   Temp: 98.7 F (37.1 C)  98 F (36.7 C)   TempSrc: Axillary  Axillary   SpO2: 93%  94%   Weight:   56.8 kg (125 lb 3.5 oz)   Height:       Weight change: -0.7 kg (-1 lb 8.7 oz)  Intake/Output Summary (Last 24 hours) at 12/27/16 0931 Last data filed at 12/27/16 0820  Gross per 24 hour  Intake            10206 ml  Output            10108 ml  Net               98 ml    Assessment/ Plan: Pt is a 51 y.o. yo female with ESRD who was admitted on 12/19/2016 with sz/hemiparesis- found to have right sided frontal lobe hemorrhage Assessment/Plan: 1. Intracranial hemorrhage- did not require surgery /PRES also present- was making some clinical recovery but now s/p episode of unresponsiveness "code" on hemodialysis 6/22.  Ultimately planning for rehab 2. ESRD - normally on PD as OP- thought to change to HD because unsure if she could continue to do PD at home given defecits. However, clearly is NOT TOLERATING HD- unclear exactly why.  We will be able to do her PD for her while she is at [facility] so will plan to do that for now.  If is looking like she cannot do as OP in the future we will challenge her again with HD but much in the fulture 3. Anemia- history of GIB and low hgb- has responded remarkable well to ESA and that is part of reason for hypertension 4. Secondary hyperparathyroidism- resumed phoslo, calcitriol and sensipar  5. HTN/volume- goal for higher BP- previously on cleviprex.  Now coreg 12.5 BID, amlodipine 10 daily and cozaar 100 daily, clonidine 0.1 daily, hydralazine 25 TID with prns of hydralazine and labetalol.  She is usually on 3 drugs, not 5- may be able to simplify reg at some point- using 1.5 % fluid - will mix in a 2.5 given only 100 of  UF overnight   Tobin Cadiente A    Labs: Basic Metabolic Panel:  Recent Labs Lab 12/25/16 0245 12/26/16 0443 12/27/16 0248  NA 134* 137 137  K 4.8 4.4 4.7  CL 99* 99* 99*  CO2 20* 21* 22  GLUCOSE 122* 113* 116*  BUN 65* 59* 60*  CREATININE 10.94* 11.08* 11.78*  CALCIUM 8.8* 8.9 9.1  PHOS 7.5* 10.1* 9.5*   Liver Function Tests:  Recent Labs Lab 12/22/16 0742 12/27/16 0248  ALT 14  --   ALBUMIN  --  2.6*   No results for input(s): LIPASE, AMYLASE in the last 168 hours.  Recent Labs Lab 12/22/16 1048  AMMONIA 38*   CBC:  Recent Labs Lab 12/23/16 0253 12/24/16 0508 12/24/16 1238 12/25/16 0245 12/26/16 0443  WBC 7.6 7.9 9.9 10.6* 10.3  HGB 17.4* 17.7* 18.0* 18.7* 17.6*  HCT 53.4* 55.0* 55.3* 55.9* 55.7*  MCV 101.1* 100.9* 100.2* 98.9 102.6*  PLT 247 278 253 159 218   Cardiac Enzymes: No results for input(s): CKTOTAL, CKMB, CKMBINDEX, TROPONINI in the last 168 hours. CBG:  Recent Labs Lab  12/22/16 0803 12/24/16 1108  GLUCAP 108* 148*    Iron Studies: No results for input(s): IRON, TIBC, TRANSFERRIN, FERRITIN in the last 72 hours. Studies/Results: No results found. Medications: Infusions: . sodium chloride Stopped (12/26/16 1730)  . dialysis solution 1.5% low-MG/low-CA    . levETIRAcetam Stopped (12/26/16 2252)    Scheduled Medications: . amLODipine  10 mg Oral Daily  . calcitRIOL  0.25 mcg Oral Q M,W,F-1800  . calcium acetate  1,334 mg Oral TID WC  . carvedilol  12.5 mg Oral BID WC  . cinacalcet  60 mg Oral Q supper  . cloNIDine  0.1 mg Oral TID  . famotidine  20 mg Oral Daily  . gentamicin cream  1 application Topical Daily  . hydrALAZINE  25 mg Oral Q8H  . levothyroxine  88 mcg Oral QAC breakfast  . losartan  100 mg Oral QHS  . multivitamin  1 tablet Oral Daily  . pantoprazole  40 mg Oral BID  . senna-docusate  1 tablet Oral BID    have reviewed scheduled and prn medications.  Physical Exam: General: much more alert "hey  doc" Heart: RRR Lungs: mostly clear Abdomen: soft, PD cath in place Extremities: minimal edema Dialysis Access: PD cath and right upper ARM AVG     12/27/2016,9:31 AM  LOS: 8 days

## 2016-12-27 NOTE — Progress Notes (Signed)
STROKE TEAM PROGRESS NOTE   SUBJECTIVE (INTERVAL HISTORY) Patient transferred to stepdown yesterday, still lethargic, lying in bed, opens eyes to voice, follows commands. No neuro changes.   OBJECTIVE Temp:  [98 F (36.7 C)-98.8 F (37.1 C)] 98 F (36.7 C) (06/24 0805) Pulse Rate:  [58-73] 73 (06/24 0805) Cardiac Rhythm: Normal sinus rhythm (06/24 0754) Resp:  [13-17] 17 (06/24 0805) BP: (121-172)/(77-103) 153/93 (06/24 0912) SpO2:  [92 %-99 %] 94 % (06/24 0805) Weight:  [54.3 kg (119 lb 11.4 oz)-56.8 kg (125 lb 3.5 oz)] 56.8 kg (125 lb 3.5 oz) (06/24 0805)  CBC:   Recent Labs Lab 12/25/16 0245 12/26/16 0443  WBC 10.6* 10.3  HGB 18.7* 17.6*  HCT 55.9* 55.7*  MCV 98.9 102.6*  PLT 159 034    Basic Metabolic Panel:   Recent Labs Lab 12/25/16 0245 12/26/16 0443 12/27/16 0248  NA 134* 137 137  K 4.8 4.4 4.7  CL 99* 99* 99*  CO2 20* 21* 22  GLUCOSE 122* 113* 116*  BUN 65* 59* 60*  CREATININE 10.94* 11.08* 11.78*  CALCIUM 8.8* 8.9 9.1  MG 2.4 2.2  --   PHOS 7.5* 10.1* 9.5*    Lipid Panel:     Component Value Date/Time   CHOL 214 (H) 02/12/2015 0511   TRIG 126 12/25/2016 0801   HDL 83 02/12/2015 0511   CHOLHDL 2.6 02/12/2015 0511   VLDL 8 02/12/2015 0511   LDLCALC 123 (H) 02/12/2015 0511   HgbA1c: No results found for: HGBA1C Urine Drug Screen:     Component Value Date/Time   LABOPIA NONE DETECTED 12/19/2016 2051   COCAINSCRNUR NONE DETECTED 12/19/2016 2051   LABBENZ POSITIVE (A) 12/19/2016 2051   AMPHETMU NONE DETECTED 12/19/2016 2051   THCU NONE DETECTED 12/19/2016 2051   LABBARB NONE DETECTED 12/19/2016 2051    Alcohol Level No results found for: [place] I have personally reviewed the radiological images below and agree with the radiology interpretations.  Ct Head Wo Contrast 12/24/2016 1. Stable right frontal hemorrhage with evolving surrounding edema. 2. Stable 3-4 mm of midline shift.  12/22/2016 IMPRESSION: Stable intraparenchymal  hemorrhage. Subtle signs of midline shift, and slight increased pressure. But no significant worsening from priors.    12/22/2016 IMPRESSION: 1. No significant interval change in posterior right frontal intraparenchymal hematoma with extra-axial extension. Similar localized edema and mass effect with 3 mm right-to-left shift. 2. No other new acute intracranial process.   12/21/2016 1. Continued increase in size of right frontal parenchymal hemorrhage with extra-axial extension. 2. Slight increase in associated mass effect.  12/20/2016 1. Slightly increased size of right frontal intraparenchymal hematoma with unchanged right convexity subarachnoid blood, compared to head CTA performed 12/19/2016. Compared to the initial head CT from this presentation, obtained at 5:02 p.m. on 12/19/2016, the hematoma has increased in size.  2. No new hemorrhage, hydrocephalus or mass effect.   12/19/2016 1. Right posterior frontal lobe acute hemorrhage measuring up to 29 mm with small area of surrounding edema and local mass effect. No significant midline shift or herniation. Follow-up to ensure resolution is recommended to exclude an underlying mass.  2. Small volume of subarachnoid hemorrhage overlying the right frontal convexity.  3. Hypoattenuation within subcortical white matter and bilateral parietal and occipital lobes. Findings are suggestive of PRES in the setting of hypertension. This can be further characterized with MRI of the brain.   Ct Angio Head W Or Wo Contrast Ct Angio Neck W And/or Wo Contrast 12/19/2016 1. No  vascular explanation for the right frontal parietal hematoma. Dural venous sinuses are patent and there is no evidence of vascular malformation.  2. The 16 cc hematoma is mildly increased from earlier CT. Negative for shift or herniation.  3. Fibromuscular dysplasia of the bilateral cervical ICA.  4. Moderate atherosclerosis for age. Negative for flow limiting stenosis.    Mr Brain Wo  Contrast 12/19/2016 1. Findings of posterior reversible encephalopathy syndrome.  2. Size stable posterior right frontal hematoma compared to CTA 1 hour prior.   Ct Cervical Spine Wo Contrast 12/19/2016 No acute fracture or dislocation of the cervical spine.   Dg Swallowing Func-speech Pathology 12/20/2016 SLP Diet Recommendations Regular solids;Thin liquid Liquid Administration via Cup;Straw Medication Administration Whole meds with liquid.         EEG 12/21/2016 This awake and asleep EEG is abnormal due to diffuse slowing of the waking background.   PHYSICAL EXAM  Temp:  [98 F (36.7 C)-98.8 F (37.1 C)] 98 F (36.7 C) (06/24 0805) Pulse Rate:  [58-73] 73 (06/24 0805) Resp:  [13-17] 17 (06/24 0805) BP: (121-172)/(77-103) 153/93 (06/24 0912) SpO2:  [92 %-99 %] 94 % (06/24 0805) Weight:  [54.3 kg (119 lb 11.4 oz)-56.8 kg (125 lb 3.5 oz)] 56.8 kg (125 lb 3.5 oz) (06/24 0805)  General - Well nourished, well developed, still lethargic   Ophthalmologic - Fundi not visualized due to noncooperation.  Cardiovascular - Regular rate and rhythm.  Neuro - lethargic, following all central and peripheral commands, right gaze preference, barely cross midline, PERRL, blinking to visual threat on the right, left facial droop, tongue midline. LUE and LLE withdraw on pain stimulation. RUE and RLE spontaneous movement, at least 3/5. Sensation symmetrical, not cooperative on coordination test, gait not tested.    ASSESSMENT/PLAN [name] is a 51 y.o. female with history of a renal cell carcinoma, hypertension, end-stage renal disease on peritoneal dialysis, hyperlipidemia, and anemia presenting with seizure, left-sided weakness, and right frontal headache. She did not receive IV t-PA due to [place].  ICH: right frontal ICH with SAH in setting of hypertension with increase in hemorrhage size and MRI evidence of PRES.   Resultant left hemiplegia and left facial droop  CT head -  Right  posterior frontal lobe acute hemorrhage measuring up to 29 mm with small area of surrounding edema and local mass effect.  MRI head - Findings of PRES.  CTA - No vascular explanation for the right frontal parietal hematoma. Fibromuscular dysplasia of the bilateral cervical ICA.   Repeat CT head x 2 showed slowing increase in hemorrhage size  Repeat CT x 2 without significant change  2D Echo - 07/30/2016 - EF 60-65%. No cardiac source of emboli identified.  EEG - diffuse slowing  VTE prophylaxis - SCDs DIET DYS 3 Room service appropriate? Yes; Fluid consistency: Thin  No antithrombotic prior to admission, now on No antithrombotic due to hemorrhage  Ongoing aggressive stroke risk factor management  Therapy recommendations:  CIR  Disposition: pending  Hypertensive Emergency  BP fluctuate    SBP goal to < 160.  Off cleviprex   Continue norvasc, coreg, clonidine, losartan, low dose hydralazine  Nephrology on board   Long-term BP goal normotensive  PRES  MRI confirmed PRES  Likely related to ESRD and hypertension  BP goal < 160   Close BP monitoring  ESRD on PD at home  Nephrology on board  Previously on PD  Attempted HD 12/22/16 - not tolerating well  Attempted HD  12/24/16 - cardiac arrest s/p intubation  Back to PD, tolerating well  Seizure on admission  Continue Keppra   No further seizure  EEG diffuse slowing  Other Active Problems  Hx renal cell cancer s/p R nephrectomy  Hyperkalemia - resolved after PD  Anemia of CKD  TSH 15.736 - now on synthroid supplement; s/p thyroidectomy 10/28/2016  Hospital day # 8  Neurology will sign off. Please call with questions. Pt will follow up with Dr. Erlinda Hong at [facility] in about 6 weeks. Thanks for the consult.  Rosalin Hawking, MD PhD Stroke Neurology 12/27/2016 11:52 AM   To contact Stroke Continuity provider, please refer to [email or web address] After hours, contact General Neurology

## 2016-12-28 DIAGNOSIS — I612 Nontraumatic intracerebral hemorrhage in hemisphere, unspecified: Secondary | ICD-10-CM

## 2016-12-28 LAB — BASIC METABOLIC PANEL
Anion gap: 14 (ref 5–15)
BUN: 54 mg/dL — ABNORMAL HIGH (ref 6–20)
CALCIUM: 9.5 mg/dL (ref 8.9–10.3)
CO2: 22 mmol/L (ref 22–32)
CREATININE: 10.86 mg/dL — AB (ref 0.44–1.00)
Chloride: 100 mmol/L — ABNORMAL LOW (ref 101–111)
GFR calc non Af Amer: 4 mL/min — ABNORMAL LOW (ref >60)
GFR, EST AFRICAN AMERICAN: 4 mL/min — AB (ref >60)
GLUCOSE: 113 mg/dL — AB (ref 65–99)
Potassium: 4.4 mmol/L (ref 3.5–5.1)
Sodium: 136 mmol/L (ref 135–145)

## 2016-12-28 LAB — TRIGLYCERIDES: TRIGLYCERIDES: 141 mg/dL (ref <150)

## 2016-12-28 LAB — MAGNESIUM: Magnesium: 2 mg/dL (ref 1.7–2.4)

## 2016-12-28 MED ORDER — DELFLEX-LC/2.5% DEXTROSE 394 MOSM/L IP SOLN
INTRAPERITONEAL | Status: DC
  Administered 2016-12-28: 5000 mL via INTRAPERITONEAL

## 2016-12-28 MED ORDER — DELFLEX-LC/1.5% DEXTROSE 344 MOSM/L IP SOLN
INTRAPERITONEAL | Status: DC | PRN
  Filled 2016-12-28: qty 3000

## 2016-12-28 MED ORDER — DELFLEX-LC/1.5% DEXTROSE 344 MOSM/L IP SOLN
INTRAPERITONEAL | Status: DC
  Administered 2016-12-28: 5000 mL via INTRAPERITONEAL

## 2016-12-28 MED ORDER — HEPARIN 1000 UNIT/ML FOR PERITONEAL DIALYSIS: 500.0000 [IU] | INTRAMUSCULAR | Status: DC | PRN

## 2016-12-28 MED ORDER — GENTAMICIN SULFATE 0.1 % EX CREA
1.0000 "application " | TOPICAL_CREAM | Freq: Every day | CUTANEOUS | Status: DC
  Administered 2016-12-28 – 2016-12-30 (×3): 1 via TOPICAL
  Filled 2016-12-28: qty 15

## 2016-12-28 MED ORDER — ORAL CARE MOUTH RINSE
15.0000 mL | Freq: Two times a day (BID) | OROMUCOSAL | Status: DC
  Administered 2016-12-29 – 2017-01-03 (×7): 15 mL via OROMUCOSAL

## 2016-12-28 MED ORDER — DELFLEX-LC/2.5% DEXTROSE 394 MOSM/L IP SOLN: INTRAPERITONEAL | Status: DC

## 2016-12-28 MED ORDER — HEPARIN 1000 UNIT/ML FOR PERITONEAL DIALYSIS
INTRAPERITONEAL | Status: DC | PRN
  Filled 2016-12-28: qty 3000

## 2016-12-28 NOTE — Progress Notes (Signed)
Physical Therapy Treatment Patient Details Name: Terry Juarez MRN: [id number] DOB: Mar 30, 1966 Today's Date: 12/28/2016    History of Present Illness Pt is a 51 y/o female who presents s/p witnessed seizure at home with new L-sided weakness. No further seizure activity noted since admission. Imaging revealed R frontal lobe hemorrhage. Coded in hemodiaysis    PT Comments    Patient progressing with sitting tolerance and standing, though remains unable to find midline or to maintain.  Feel she will continue to benefit from skilled PT in the acute setting and from follow up CIR level rehab at [facility],   Follow Up Recommendations  CIR;Supervision/Assistance - 24 hour     Equipment Recommendations  Rolling walker with 5" wheels;Wheelchair (measurements PT);Wheelchair cushion (measurements PT)    Recommendations for Other Services       Precautions / Restrictions Precautions Precautions: Fall Precaution Comments: Peritoneal dialysis patient Restrictions Weight Bearing Restrictions: No    Mobility  Bed Mobility Overal bed mobility: Needs Assistance Bed Mobility: Rolling;Sidelying to Sit Rolling: Max assist Sidelying to sit: Max assist       General bed mobility comments: assist to flex L knee and to bring L shoulder forward (able to assist after cues), assist for L LE off bed and to sit up from right sidelying  Transfers Overall transfer level: Needs assistance Equipment used: 2 person hand held assist Transfers: Sit to/from [facility] Sit to Stand: Mod assist;+2 physical assistance Stand pivot transfers: Total assist;+2 physical assistance       General transfer comment: Left knee in hyperextension in standing  Ambulation/Gait                 Stairs            Wheelchair Mobility    Modified Rankin (Stroke Patients Only) Modified Rankin (Stroke Patients Only) Pre-Morbid Rankin Score: No symptoms Modified Rankin: Severe  disability     Balance Overall balance assessment: Needs assistance Sitting-balance support: Feet supported;Single extremity supported Sitting balance-Leahy Scale: Poor Sitting balance - Comments: pt unable to achieve midline on her own and if we A'd her to find it then she would either push to the L with RUE on the bed, falling forward to left, or fall posteriorly. Worked with her sitting balance with support input at left rib cage and holding head up, while she held on with LUE or placed her LUE where we asked her to (ie: on the chair rail, on her knee) Postural control: Posterior lean;Left lateral lean Standing balance support: Bilateral upper extremity supported Standing balance-Leahy Scale: Zero Standing balance comment: when shifted to R able to stand with less support, but at times falls to L in standing                            Cognition Arousal/Alertness: Awake/alert Behavior During Therapy: Flat affect Overall Cognitive Status: Impaired/Different from baseline Area of Impairment: Orientation;Following commands;Safety/judgement;Problem solving                 Orientation Level: Disoriented to;Time     Following Commands: Follows one step commands inconsistently;Follows one step commands with increased time Safety/Judgement: Decreased awareness of safety;Decreased awareness of deficits   Problem Solving: Slow processing;Decreased initiation;Difficulty sequencing;Requires verbal cues;Requires tactile cues General Comments: Following one-step commands with increased time. Oriented to person, place, month, and year. Level of arousal impacting ability to accurately assess cognition.       Exercises  General Comments        Pertinent Vitals/Pain Pain Assessment: No/denies pain Faces Pain Scale: No hurt    Home Living                      Prior Function            PT Goals (current goals can now be found in the care plan section)  Progress towards PT goals: Progressing toward goals    Frequency    Min 4X/week      PT Plan Current plan remains appropriate    Co-evaluation PT/OT/SLP Co-Evaluation/Treatment: Yes Reason for Co-Treatment: Complexity of the patient's impairments (multi-system involvement);For patient/therapist safety;To address functional/ADL transfers   OT goals addressed during session: ADL's and self-care;Strengthening/ROM      AM-PAC PT "6 Clicks" Daily Activity  Outcome Measure  Difficulty turning over in bed (including adjusting bedclothes, sheets and blankets)?: Total Difficulty moving from lying on back to sitting on the side of the bed? : Total Difficulty sitting down on and standing up from a chair with arms (e.g., wheelchair, bedside commode, etc,.)?: Total Help needed moving to and from a bed to chair (including a wheelchair)?: Total Help needed walking in hospital room?: Total Help needed climbing 3-5 steps with a railing? : Total 6 Click Score: 6    End of Session Equipment Utilized During Treatment: Gait belt Activity Tolerance: Patient tolerated treatment well Patient left: in chair;with call bell/phone within reach;with chair alarm set Nurse Communication: Mobility status;Need for lift equipment PT Visit Diagnosis: Hemiplegia and hemiparesis Hemiplegia - Right/Left: Left Hemiplegia - dominant/non-dominant: Non-dominant Hemiplegia - caused by: Nontraumatic intracerebral hemorrhage     Time: 3419-3790 PT Time Calculation (min) (ACUTE ONLY): 32 min  Charges:  $Therapeutic Activity: 8-22 mins                    G CodesMagda Kiel, [place] [phone] 12/28/2016    Reginia Naas 12/28/2016, 1:44 PM

## 2016-12-28 NOTE — Progress Notes (Addendum)
PROGRESS NOTE                                                                                                                                                                                                             Patient Demographics:    Terry Juarez, is a 51 y.o. female, DOB - 1966-05-24, KPV:[id number]  Admit date - 12/19/2016   Admitting Physician Reyne Dumas, MD  Outpatient Primary MD for the patient is Tisovec, Fransico Him, MD  LOS - 9    Chief Complaint  Patient presents with  . Seizures       Brief Narrative   51 year old female with ESRD on peritoneal dialysis, renal cell carcinoma status post right nephrectomy, hypertension, hyperlipidemia and anemia was found down on the floor actively seizing by her roommate. She was brought to the ED with symptoms of right frontal headache and acute left-sided weakness. Head CT showed large 29 mm right frontal hemorrhage with surrounding edema and mass effect along with small subarachnoid hemorrhages and signs of PRES. CT angiogram head and neck showed no AVMs but with increase in size of the hematoma. Patient to my neurology in the ED and started on nicardipine and  Keppra. MRI of the brain showed no change in the size of right frontal hematoma and also conformed PRES.  Last head CT on 6/21 showed stable right frontal hemorrhage with evolving surrounding edema and stable 3-4 mm midline shift.  Patient transferred to hospitalist service on 6/24.   Subjective:   Patient seen and examined. Appears more alert and oriented and answering questions.   Assessment  & Plan :    Active Problems:   Intracranial hemorrhage (HCC) Right posterior frontal lobe acute hemorrhage with surrounding edema and local mass effect.  CT and MRI findings suggestive of PRES in the setting of hypertension. Also has subarachnoid hemorrhage. No vascular explanation for right frontoparietal  hematoma. Has  residual left hemiparesis. 2-D echo without cardiac source of emboli. EEG shows diffuse slowing. No antithrombotic or anticoagulation due to hemorrhage. Appreciate neurology's recommendation. Aggressive stroke risk factor management. PT recommends CIR. SLP recommends MBS. Follow-up with Dr Erlinda Hong in 6 weeks.  Acute seizures Likely triggered by intracranial hemorrhage. Continue empiric Keppra. EEG shows diffuse slowing.      PRES (posterior reversible encephalopathy syndrome) with hypertensive emergency SBP goal less  than 160. Currently on 5 different blood pressure medications.. Titrate as tolerated.  Cardiac arrest on 6/22 Short cardiac arrest of 30 seconds while at hemodialysis on 6/22. Plan on no further attempt to hemodialysis due to labile blood pressure.  Acute respiratory failure with hypoxia Multifactorial secondary to hypertensive emergency, seizures and ICH Currently on room air.  ESRD on PD Not tolerating hemodialysis. Renal consult appreciated. Resumed Sensipar, calcitriol and PhosLo.  History of renal cell carcinoma status post right nephrectomy  Polycythemia Etiology unclear. Monitor for now. Prior history of GI bleed with anemia, has responded well to ESA.    PT recommends CIR. Consult placed. Can be discharged as early as tomorrow if bed available    Code Status : Full code  Family Communication  : None at bedside  Disposition Plan  : Transfer to telemetry. Stable to be discharged to CIR on 6/26.   Barriers For Discharge : Hypertension, pending CIR approval  Consults  :   Neurology Nephrology   Procedures  :  Hemodialysis CT head CT angiogram head and neck MRI brain  DVT Prophylaxis  : SCDs  Lab Results  Component Value Date   PLT 218 12/26/2016    Antibiotics  :   Anti-infectives    None        Objective:   Vitals:   12/28/16 1057 12/28/16 1142 12/28/16 1500 12/28/16 1543  BP: (!) 153/86 (!) 141/89  (!) 154/92    Pulse:  68 86   Resp:  16 16   Temp:  99.2 F (37.3 C) 99 F (37.2 C)   TempSrc:  Oral Oral   SpO2:  94% 93%   Weight:      Height:        Wt Readings from Last 3 Encounters:  12/28/16 55 kg (121 lb 4.1 oz)  09/04/16 54.4 kg (120 lb)  08/02/16 56.9 kg (125 lb 7.1 oz)     Intake/Output Summary (Last 24 hours) at 12/28/16 1555 Last data filed at 12/28/16 0900  Gross per 24 hour  Intake            10210 ml  Output            11392 ml  Net            -1182 ml     Physical Exam  Gen: not in distress, appears fatigued HEENT:  moist mucosa, supple neck Chest: clear b/l, no added sounds CVS: N S1&S2, no murmurs,  GI: soft, NT, ND, BS+ Musculoskeletal: warm, no edema CNS: Alert and oriented, responds to commands and answering questions, left hemiparesis with withdrawal to pain and intact sensations   Data Review:    CBC  Recent Labs Lab 12/23/16 0253 12/24/16 0508 12/24/16 1238 12/25/16 0245 12/26/16 0443  WBC 7.6 7.9 9.9 10.6* 10.3  HGB 17.4* 17.7* 18.0* 18.7* 17.6*  HCT 53.4* 55.0* 55.3* 55.9* 55.7*  PLT 247 278 253 159 218  MCV 101.1* 100.9* 100.2* 98.9 102.6*  MCH 33.0 32.5 32.6 33.1 32.4  MCHC 32.6 32.2 32.5 33.5 31.6  RDW 15.3 15.5 15.3 15.4 15.8*    Chemistries   Recent Labs Lab 12/22/16 0742  12/24/16 0508 12/25/16 0245 12/26/16 0443 12/27/16 0248 12/28/16 0317  NA  --   < > 136 134* 137 137 136  K  --   < > 5.5* 4.8 4.4 4.7 4.4  CL  --   < > 100* 99* 99* 99* 100*  CO2  --   < >  19* 20* 21* 22 22  GLUCOSE  --   < > 87 122* 113* 116* 113*  BUN  --   < > 88* 65* 59* 60* 54*  CREATININE  --   < > 13.76* 10.94* 11.08* 11.78* 10.86*  CALCIUM  --   < > 8.8* 8.8* 8.9 9.1 9.5  MG  --   --  2.6* 2.4 2.2  --  2.0  ALT 14  --   --   --   --   --   --   < > = values in this interval not displayed. ------------------------------------------------------------------------------------------------------------------  Recent Labs  12/28/16 0317  TRIG  141    No results found for: HGBA1C ------------------------------------------------------------------------------------------------------------------ No results for input(s): TSH, T4TOTAL, T3FREE, THYROIDAB in the last 72 hours.  Invalid input(s): FREET3 ------------------------------------------------------------------------------------------------------------------ No results for input(s): VITAMINB12, FOLATE, FERRITIN, TIBC, IRON, RETICCTPCT in the last 72 hours.  Coagulation profile No results for input(s): INR, PROTIME in the last 168 hours.  No results for input(s): DDIMER in the last 72 hours.  Cardiac Enzymes No results for input(s): CKMB, TROPONINI, MYOGLOBIN in the last 168 hours.  Invalid input(s): CK ------------------------------------------------------------------------------------------------------------------ No results found for: BNP  Inpatient Medications  Scheduled Meds: . amLODipine  10 mg Oral Daily  . calcitRIOL  0.25 mcg Oral Q M,W,F-1800  . calcium acetate  1,334 mg Oral TID WC  . carvedilol  12.5 mg Oral BID WC  . cinacalcet  60 mg Oral Q supper  . cloNIDine  0.1 mg Oral TID  . famotidine  20 mg Oral Daily  . gentamicin cream  1 application Topical Daily  . hydrALAZINE  25 mg Oral Q8H  . levothyroxine  88 mcg Oral QAC breakfast  . losartan  100 mg Oral QHS  . mouth rinse  15 mL Mouth Rinse BID  . multivitamin  1 tablet Oral Daily  . pantoprazole  40 mg Oral BID  . senna-docusate  1 tablet Oral BID   Continuous Infusions: . sodium chloride Stopped (12/26/16 1730)  . dialysis solution 1.5% low-MG/low-CA    . dialysis solution 2.5% low-MG/low-CA    . dialysis solution 2.5% low-MG/low-CA    . levETIRAcetam Stopped (12/28/16 1112)   PRN Meds:.sodium chloride, dianeal solution for CAPD/CCPD with heparin, dianeal solution for CAPD/CCPD with heparin, hydrALAZINE, labetalol  Micro Results Recent Results (from the past 240 hour(s))  MRSA PCR  Screening     Status: None   Collection Time: 12/20/16  1:50 AM  Result Value Ref Range Status   MRSA by PCR NEGATIVE NEGATIVE Final    Comment:        The GeneXpert MRSA Assay (FDA approved for NASAL specimens only), is one component of a comprehensive MRSA colonization surveillance program. It is not intended to diagnose MRSA infection nor to guide or monitor treatment for MRSA infections.     Radiology Reports Ct Angio Head W Or Wo Contrast  Result Date: 12/19/2016 CLINICAL DATA:  Intraparenchymal and subarachnoid hemorrhage. EXAM: CT ANGIOGRAPHY HEAD AND NECK TECHNIQUE: Multidetector CT imaging of the head and neck was performed using the standard protocol during bolus administration of intravenous contrast. Multiplanar CT image reconstructions and MIPs were obtained to evaluate the vascular anatomy. Carotid stenosis measurements (when applicable) are obtained utilizing NASCET criteria, using the distal internal carotid diameter as the denominator. CONTRAST:  50 cc Isovue 370 intravenous COMPARISON:  Noncontrast head CT from earlier today FINDINGS: CTA NECK FINDINGS Aortic arch: Atheromatous wall thickening that is mild.  No acute finding or aneurysm. Right carotid system: Mild atheromatous plaque at the common carotid bifurcation without stenosis or ulceration. Beaded ICA consistent with fibromuscular dysplasia. No superimposed acute dissection. Left carotid system: Scattered atheromatous plaque on the distal common carotid and proximal ICA, moderate for age. No flow limiting stenosis or ulceration. Beading of the left ICA consistent with fibromuscular dysplasia. No superimposed dissection. Vertebral arteries: Proximal subclavian atherosclerosis without flow limiting stenosis. Both vertebral arteries are smooth and widely patent to the dura. Left vertebral dominance. Skeleton: No acute or aggressive finding. Other neck: Negative for mass or inflammation. Upper chest: Negative Review of the  MIP images confirms the above findings CTA HEAD FINDINGS Anterior circulation: Moderate calcified plaque on the carotid siphons without flow limiting stenosis. Standard branching. There is no evidence of aneurysm or vascular malformation. No major branch occlusion or proximal flow limiting stenosis. Posterior circulation: Left dominant vertebral artery. The vertebral and basilar arteries are smooth and widely patent. Aplastic right and mildly hypoplastic left P1 segments with large posterior communicating arteries. No branch occlusion, stenosis, aneurysm, or evidence of vascular malformation. Venous sinuses: Patent on the delayed phase. Anatomic variants: Fetal type PCA circulation, especially on the right. Delayed phase: No masslike enhancement. Pending brain MRI. The right frontal parietal hematoma is larger than before, measuring 27 x 34 x 35 mm as compared to 20 x 31 x 28 mm. Local subarachnoid hemorrhage and mild cerebral edema without shift or herniation. Review of the MIP images confirms the above findings IMPRESSION: 1. No vascular explanation for the right frontal parietal hematoma. Dural venous sinuses are patent and there is no evidence of vascular malformation. 2. The 16 cc hematoma is mildly increased from earlier CT. Negative for shift or herniation. 3. Fibromuscular dysplasia of the bilateral cervical ICA. 4. Moderate atherosclerosis for age. Negative for flow limiting stenosis. Electronically Signed   By: Monte Fantasia M.D.   On: 12/19/2016 19:37   Ct Head Wo Contrast  Result Date: 12/24/2016 CLINICAL DATA:  Intraparenchymal hemorrhage. EXAM: CT HEAD WITHOUT CONTRAST TECHNIQUE: Contiguous axial images were obtained from the base of the skull through the vertex without intravenous contrast. COMPARISON:  Multiple prior head CTs, most recently 12/22/2016 FINDINGS: Brain: The right frontal hemorrhage is stable in size. Evolving surrounding edema is noted. 3-4 mm of midline shift is not  significantly changed. No new hemorrhage is present. The subarachnoid hemorrhage is not significantly changed. Vascular: Atherosclerotic calcifications are again noted. Skull: Calvarium is intact. Sinuses/Orbits: The paranasal sinuses and mastoid air cells are clear. IMPRESSION: 1. Stable right frontal hemorrhage with evolving surrounding edema. 2. Stable 3-4 mm of midline shift. Electronically Signed   By: San Morelle M.D.   On: 12/24/2016 12:22   Ct Head Wo Contrast  Result Date: 12/22/2016 CLINICAL DATA:  Patient admitted for [place], stroke. MRI demonstrated PRES. EXAM: CT HEAD WITHOUT CONTRAST TECHNIQUE: Contiguous axial images were obtained from the base of the skull through the vertex without intravenous contrast. COMPARISON:  Multiple priors, most recent earlier today at 3:55 a.m. FINDINGS: Brain: Redemonstrated is an intraparenchymal hemorrhage, not significantly changed in size or shape, roughly 4 x 3 x 5 cm. Mild surrounding vasogenic edema is not increased. Slight effacement RIGHT lateral ventricle. Unchanged midline shift RIGHT to LEFT, roughly 3 mm. No hydrocephalus or ventricular trapping. Effacement of the basilar cisterns redemonstrated. Vascular: No hyperdense vessel or unexpected calcification. Skull: Normal. Negative for fracture or focal lesion. Sinuses/Orbits: No acute finding. Other: None. IMPRESSION: Stable intraparenchymal hemorrhage. Subtle  signs of midline shift, and slight increased pressure. But no significant worsening from priors. Electronically Signed   By: Staci Righter M.D.   On: 12/22/2016 09:47   Ct Head Wo Contrast  Result Date: 12/22/2016 CLINICAL DATA:  Follow-up examination for acute intracranial hemorrhage. EXAM: CT HEAD WITHOUT CONTRAST TECHNIQUE: Contiguous axial images were obtained from the base of the skull through the vertex without intravenous contrast. COMPARISON:  Prior CT from 12/21/2016. FINDINGS: Brain: Intraparenchymal hemorrhage centered at the high  right frontal parietal region again seen. Hemorrhage overall not significantly changed measuring 4.1 x 3.3 x 5.4 cm seen greatest dimensions (previously 4.1 x 3.4 x 5.5 cm). Surrounding low-density vasogenic edema relatively with sulcal effacement relatively similar. Mass effect on the right lateral ventricle which is partially effaced relatively unchanged. Right-to-left midline shift measures approximately 3 mm, similar. No hydrocephalus or ventricular trapping. No intraventricular extension of hemorrhage. Adjacent small volume subarachnoid hemorrhage noted, similar. Adjacent extra-axial hemorrhage unchanged. No new acute intracranial hemorrhage. No evidence for acute large vessel territory infarct. Vascular: No hyperdense vessel. Scattered vascular calcifications noted within the carotid siphons. Skull: Scalp soft tissues and calvarium unchanged, and remain within normal limits. Sinuses/Orbits: Globes and orbital soft tissues within normal limits. Paranasal sinuses and mastoid air cells remain clear. IMPRESSION: 1. No significant interval change in posterior right frontal intraparenchymal hematoma with extra-axial extension. Similar localized edema and mass effect with 3 mm right-to-left shift. 2. No other new acute intracranial process. Electronically Signed   By: Jeannine Boga M.D.   On: 12/22/2016 04:49   Ct Head Wo Contrast  Result Date: 12/21/2016 CLINICAL DATA:  Intracranial hemorrhage. EXAM: CT HEAD WITHOUT CONTRAST TECHNIQUE: Contiguous axial images were obtained from the base of the skull through the vertex without intravenous contrast. COMPARISON:  CT head without contrast 12/20/2016 and 12/19/2016. FINDINGS: Brain: The focal hyperdense hemorrhage within the high right frontal lobe extends to the cortex. The area of hemorrhage continues to slowly increase in size. Maximal dimensions on coronal and sagittal reformats are now 4.1 x 3.4 by 5.5 cm. This compares with previous measurements of 3.8 x  8 3.3 x 5.2 cm and is significantly larger than the regional measurements. Adjacent extra-axial hemorrhage is similar the prior study. Mild edema surrounds the hemorrhage. Local mass effect is present with effacement of the sulci. Minimal midline shift is present. There is increasing effacement of the right lateral ventricle. Vascular: Atherosclerotic calcifications are present within the cavernous internal carotid arteries. There is no hyperdense vessel. Skull: The calvarium is intact. No focal lytic or blastic lesions are present. Sinuses/Orbits: The paranasal sinuses and mastoid air cells are clear. The globes and orbits are unremarkable. IMPRESSION: 1. Continued increase in size of right frontal parenchymal hemorrhage with extra-axial extension. 2. Slight increase in associated mass effect. These results were called by telephone at the time of interpretation on 12/21/2016 at 9:21 am to Dr. Rosalin Hawking , who verbally acknowledged these results. Electronically Signed   By: San Morelle M.D.   On: 12/21/2016 09:29   Ct Head Wo Contrast  Result Date: 12/20/2016 CLINICAL DATA:  Intracranial hemorrhage follow up EXAM: CT HEAD WITHOUT CONTRAST TECHNIQUE: Contiguous axial images were obtained from the base of the skull through the vertex without intravenous contrast. COMPARISON:  Brain MRI 12/19/2016 Head CT 12/19/2016 Head CTA 12/19/2016 FINDINGS: Brain: Intraparenchymal hematoma centered in the right frontal lobe now measures 3.8 x 3.4 x 3.8 cm, previously 3.7 x 3.8 by 3.0 cm. Surrounding edema is unchanged. There  is subarachnoid blood over the right convexity, also unchanged. There is no midline shift. No hydrocephalus. Basal cisterns remain patent. Vascular: No hyperdense vessel or unexpected calcification. Skull: Normal visualized skull base, calvarium and extracranial soft tissues. Sinuses/Orbits: No sinus fluid levels or advanced mucosal thickening. No mastoid effusion. Normal orbits. IMPRESSION: 1.  Slightly increased size of right frontal intraparenchymal hematoma with unchanged right convexity subarachnoid blood, compared to head CTA performed 12/19/2016. Compared to the initial head CT from this presentation, obtained at 5:02 p.m. on 12/19/2016, the hematoma has increased in size. 2. No new hemorrhage, hydrocephalus or mass effect. Electronically Signed   By: Ulyses Jarred M.D.   On: 12/20/2016 06:29   Ct Head Wo Contrast  Result Date: 12/19/2016 CLINICAL DATA:  51 y/o  F; seizure. EXAM: CT HEAD WITHOUT CONTRAST CT CERVICAL SPINE WITHOUT CONTRAST TECHNIQUE: Multidetector CT imaging of the head and cervical spine was performed following the standard protocol without intravenous contrast. Multiplanar CT image reconstructions of the cervical spine were also generated. COMPARISON:  02/11/2015 CT of the head. FINDINGS: CT HEAD FINDINGS Brain: Right posterior frontal lobe acute hemorrhage measuring 22 x 29 x 21 mm (AP x ML x CC series 3 image 26 and series 5, image 34). Small volume of subarachnoid hemorrhage with overlying the right frontal convexity and along the falx. There is a small surrounding region of hypoattenuation in the brain compatible with edema with local mass effect effacing sulci. No significant midline shift. No evidence for herniation. No large territory acute stroke identified. There are foci of hypoattenuation within subcortical white matter in the bilateral parietal lobes and occipital lobes. The distribution is suggestive of PRES. Vascular: Extensive calcific atherosclerosis of the carotid siphons. Skull: Normal. Negative for fracture or focal lesion. Sinuses/Orbits: No acute finding. Other: None. CT CERVICAL SPINE FINDINGS Alignment: Normal. Skull base and vertebrae: No acute fracture. No primary bone lesion or focal pathologic process. Soft tissues and spinal canal: No prevertebral fluid or swelling. No visible canal hematoma. Disc levels: Straightening of cervical lordosis and  discogenic degenerative changes with mild disc space narrowing and marginal osteophytes from the C3 through C5 levels. Upper chest: Negative. Other: Negative appear IMPRESSION: 1. Right posterior frontal lobe acute hemorrhage measuring up to 29 mm with small area of surrounding edema and local mass effect. No significant midline shift or herniation. Follow-up to ensure resolution is recommended to exclude an underlying mass. 2. Small volume of subarachnoid hemorrhage overlying the right frontal convexity. 3. Hypoattenuation within subcortical white matter and bilateral parietal and occipital lobes. Findings are suggestive of PRES in the setting of hypertension. This can be further characterized with MRI of the brain. 4. No acute fracture or dislocation of the cervical spine. These results were called by telephone at the time of interpretation on 12/19/2016 at 5:12 pm to Dr. Lajean Saver , who verbally acknowledged these results. Electronically Signed   By: Kristine Garbe M.D.   On: 12/19/2016 17:23   Ct Angio Neck W And/or Wo Contrast  Result Date: 12/19/2016 CLINICAL DATA:  Intraparenchymal and subarachnoid hemorrhage. EXAM: CT ANGIOGRAPHY HEAD AND NECK TECHNIQUE: Multidetector CT imaging of the head and neck was performed using the standard protocol during bolus administration of intravenous contrast. Multiplanar CT image reconstructions and MIPs were obtained to evaluate the vascular anatomy. Carotid stenosis measurements (when applicable) are obtained utilizing NASCET criteria, using the distal internal carotid diameter as the denominator. CONTRAST:  50 cc Isovue 370 intravenous COMPARISON:  Noncontrast head CT from earlier  today FINDINGS: CTA NECK FINDINGS Aortic arch: Atheromatous wall thickening that is mild. No acute finding or aneurysm. Right carotid system: Mild atheromatous plaque at the common carotid bifurcation without stenosis or ulceration. Beaded ICA consistent with fibromuscular  dysplasia. No superimposed acute dissection. Left carotid system: Scattered atheromatous plaque on the distal common carotid and proximal ICA, moderate for age. No flow limiting stenosis or ulceration. Beading of the left ICA consistent with fibromuscular dysplasia. No superimposed dissection. Vertebral arteries: Proximal subclavian atherosclerosis without flow limiting stenosis. Both vertebral arteries are smooth and widely patent to the dura. Left vertebral dominance. Skeleton: No acute or aggressive finding. Other neck: Negative for mass or inflammation. Upper chest: Negative Review of the MIP images confirms the above findings CTA HEAD FINDINGS Anterior circulation: Moderate calcified plaque on the carotid siphons without flow limiting stenosis. Standard branching. There is no evidence of aneurysm or vascular malformation. No major branch occlusion or proximal flow limiting stenosis. Posterior circulation: Left dominant vertebral artery. The vertebral and basilar arteries are smooth and widely patent. Aplastic right and mildly hypoplastic left P1 segments with large posterior communicating arteries. No branch occlusion, stenosis, aneurysm, or evidence of vascular malformation. Venous sinuses: Patent on the delayed phase. Anatomic variants: Fetal type PCA circulation, especially on the right. Delayed phase: No masslike enhancement. Pending brain MRI. The right frontal parietal hematoma is larger than before, measuring 27 x 34 x 35 mm as compared to 20 x 31 x 28 mm. Local subarachnoid hemorrhage and mild cerebral edema without shift or herniation. Review of the MIP images confirms the above findings IMPRESSION: 1. No vascular explanation for the right frontal parietal hematoma. Dural venous sinuses are patent and there is no evidence of vascular malformation. 2. The 16 cc hematoma is mildly increased from earlier CT. Negative for shift or herniation. 3. Fibromuscular dysplasia of the bilateral cervical ICA. 4.  Moderate atherosclerosis for age. Negative for flow limiting stenosis. Electronically Signed   By: Monte Fantasia M.D.   On: 12/19/2016 19:37   Ct Cervical Spine Wo Contrast  Result Date: 12/19/2016 CLINICAL DATA:  51 y/o  F; seizure. EXAM: CT HEAD WITHOUT CONTRAST CT CERVICAL SPINE WITHOUT CONTRAST TECHNIQUE: Multidetector CT imaging of the head and cervical spine was performed following the standard protocol without intravenous contrast. Multiplanar CT image reconstructions of the cervical spine were also generated. COMPARISON:  02/11/2015 CT of the head. FINDINGS: CT HEAD FINDINGS Brain: Right posterior frontal lobe acute hemorrhage measuring 22 x 29 x 21 mm (AP x ML x CC series 3 image 26 and series 5, image 34). Small volume of subarachnoid hemorrhage with overlying the right frontal convexity and along the falx. There is a small surrounding region of hypoattenuation in the brain compatible with edema with local mass effect effacing sulci. No significant midline shift. No evidence for herniation. No large territory acute stroke identified. There are foci of hypoattenuation within subcortical white matter in the bilateral parietal lobes and occipital lobes. The distribution is suggestive of PRES. Vascular: Extensive calcific atherosclerosis of the carotid siphons. Skull: Normal. Negative for fracture or focal lesion. Sinuses/Orbits: No acute finding. Other: None. CT CERVICAL SPINE FINDINGS Alignment: Normal. Skull base and vertebrae: No acute fracture. No primary bone lesion or focal pathologic process. Soft tissues and spinal canal: No prevertebral fluid or swelling. No visible canal hematoma. Disc levels: Straightening of cervical lordosis and discogenic degenerative changes with mild disc space narrowing and marginal osteophytes from the C3 through C5 levels. Upper chest: Negative. Other:  Negative appear IMPRESSION: 1. Right posterior frontal lobe acute hemorrhage measuring up to 29 mm with small area of  surrounding edema and local mass effect. No significant midline shift or herniation. Follow-up to ensure resolution is recommended to exclude an underlying mass. 2. Small volume of subarachnoid hemorrhage overlying the right frontal convexity. 3. Hypoattenuation within subcortical white matter and bilateral parietal and occipital lobes. Findings are suggestive of PRES in the setting of hypertension. This can be further characterized with MRI of the brain. 4. No acute fracture or dislocation of the cervical spine. These results were called by telephone at the time of interpretation on 12/19/2016 at 5:12 pm to Dr. Lajean Saver , who verbally acknowledged these results. Electronically Signed   By: Kristine Garbe M.D.   On: 12/19/2016 17:23   Mr Brain Wo Contrast  Result Date: 12/19/2016 CLINICAL DATA:  Seizure and altered mental status. EXAM: MRI HEAD WITHOUT CONTRAST TECHNIQUE: Multiplanar, multiecho pulse sequences of the brain and surrounding structures were obtained without intravenous contrast. COMPARISON:  CTA head neck from earlier the same day FINDINGS: Brain: Acute parenchymal hematoma in the posterior right frontal lobe is size stable from CT 60 minutes prior. There is subarachnoid FLAIR signal hyperintensity along the right more than left convexity. On the right there is associated susceptibility on gradient imaging correlating with subarachnoid hemorrhage by CT. There is bilateral cortical and subcortical T2 hyperintensity within the predominantly high and posterior cerebral convexities, also seen along the left putamen. No hydrocephalus. No primary infarct findings. Vascular: Major flow voids are preserved. Skull and upper cervical spine: Negative for marrow lesion Sinuses/Orbits: Negative Other: These results were called by telephone at the time of interpretation on 12/19/2016 at 8:30 pm to Dr. Lajean Saver , who verbally acknowledged these results. IMPRESSION: 1. Findings of posterior  reversible encephalopathy syndrome. 2. Size stable posterior right frontal hematoma compared to CTA 1 hour prior. Electronically Signed   By: Monte Fantasia M.D.   On: 12/19/2016 20:35   Dg Chest Port 1 View  Result Date: 12/25/2016 CLINICAL DATA:  Intubation. EXAM: PORTABLE CHEST 1 VIEW COMPARISON:  07/29/2016 . FINDINGS: Endotracheal tube noted with its tip 4.1 cm above the carina. Heart size normal. No focal infiltrate. Prominent nipple shadows again noted. No pleural effusion or pneumothorax. IMPRESSION: 1.  Endotracheal tube tip noted 4.1 cm above the carina. 2. No acute cardiopulmonary disease. Electronically Signed   By: Marcello Moores  Register   On: 12/25/2016 07:14   Dg Swallowing Func-speech Pathology  Result Date: 12/20/2016 Objective Swallowing Evaluation: Type of Study: MBS-Modified Barium Swallow Study Patient Details Name: JAYLIAH BENETT MRN: [id number] Date of Birth: 19-Mar-1966 Today's Date: 12/20/2016 Time: SLP Start Time (ACUTE ONLY): 0914-SLP Stop Time (ACUTE ONLY): 1751 SLP Time Calculation (min) (ACUTE ONLY): 13 min Past Medical History: Past Medical History: Diagnosis Date . Anemia  . Bruises easily  . Dialysis patient ([place])  . ESRD on dialysis ([place])  . Hyperlipidemia  . Hypertension  . Renal disorder   rt renal mass / < functioning of left kidney - being prepared for possible dialysis . Right renal mass  Past Surgical History: Past Surgical History: Procedure Laterality Date . AV FISTULA PLACEMENT Right 07/11/2014  Procedure: ARTERIOVENOUS (AV) FISTULA CREATION RIGHT ARM BRACHIO-CEPHALIC WITH ATTEMPTED RADIO-CEPHALIC (AV) FISTULA;  Surgeon: Mal Misty, MD;  Location: [facility];  Service: Vascular;  Laterality: Right; . COLONOSCOPY WITH PROPOFOL N/A 09/04/2016  Procedure: COLONOSCOPY WITH PROPOFOL;  Surgeon: Carol Ada, MD;  Location: [facility];  Service: Endoscopy;  Laterality: N/A; . ECTOPIC PREGNANCY SURGERY  1987 . ESOPHAGOGASTRODUODENOSCOPY N/A 07/30/2016  Procedure:  ESOPHAGOGASTRODUODENOSCOPY (EGD);  Surgeon: Carol Ada, MD;  Location: [facility] ENDOSCOPY;  Service: Endoscopy;  Laterality: N/A;  Bedside . ESOPHAGOGASTRODUODENOSCOPY (EGD) WITH PROPOFOL N/A 09/04/2016  Procedure: ESOPHAGOGASTRODUODENOSCOPY (EGD) WITH PROPOFOL;  Surgeon: Carol Ada, MD;  Location: [facility];  Service: Endoscopy;  Laterality: N/A; . INSERTION OF DIALYSIS CATHETER Right 07/11/2014  Procedure: INSERTION OF DIALYSIS CATHETER IN RIGHT INTERNAL JUGULAR ;  Surgeon: Mal Misty, MD;  Location: [facility];  Service: Vascular;  Laterality: Right; . LAPAROSCOPIC NEPHRECTOMY Right 07/25/2014  Procedure: RIGHT LAPAROSCOPIC RADICAL NEPHRECTOMY ;  Surgeon: Ardis Hughs, MD;  Location: [facility];  Service: Urology;  Laterality: Right; . PATCH ANGIOPLASTY Right 07/11/2014  Procedure: PATCH ANGIOPLASTY OF RIGHT RADIAL ARTERY USING CEPHALIC VEIN.;  Surgeon: Mal Misty, MD;  Location: [facility];  Service: Vascular;  Laterality: Right; . THROMBECTOMY W/ EMBOLECTOMY Right 07/11/2014  Procedure: THROMBECTOMY OF RIGHT RADIAL ARTERY  ;  Surgeon: Mal Misty, MD;  Location: [facility];  Service: Vascular;  Laterality: Right; HPI: 51 yo F with history of ESRD on Peritoneal Dialysis, HTN, Right renal mass, and Anemia, who was found down on the floor seizing. She was found to have a large right frontal hemorrhage with surrounding edema and mass effect, also with small SAH and signs of PRES. Pt describes subtle dysphagia and vocal quality changes following thyroid surgery in April. Subjective: pt alert, eager for POs, particularly before she starts dialysis Assessment / Plan / Recommendation CHL IP CLINICAL IMPRESSIONS 12/20/2016 Clinical Impression Pt's oropharyngeal swallow appears to be [facility], with no aspiration or penetration observed. Of note, she did not appear to have as many audible swallows or throat clearings during MBS as was observed at bedside, but when she did have throat clearing it was no associated with any airway  compromise. She fairly consistently completed second swallows, particularly with thin liquids, but there were only trace residuals (mostly a small coating of the tongue/base of tongue) remaining throughout her oropharynx. Recommend that pt initiate regular textures and thin liquids. SLP f/u not needed for swallowing, but recommend MD order SLP cognitive-linguistic evaluation. SLP Visit Diagnosis Dysphagia, unspecified (R13.10) Attention and concentration deficit following -- Frontal lobe and executive function deficit following -- Impact on safety and function Mild aspiration risk   CHL IP TREATMENT RECOMMENDATION 12/20/2016 Treatment Recommendations No treatment recommended at this time   Prognosis 12/20/2016 Prognosis for Safe Diet Advancement Good Barriers to Reach Goals -- Barriers/Prognosis Comment -- CHL IP DIET RECOMMENDATION 12/20/2016 SLP Diet Recommendations Regular solids;Thin liquid Liquid Administration via Cup;Straw Medication Administration Whole meds with liquid Compensations Slow rate;Small sips/bites Postural Changes Remain semi-upright after after feeds/meals (Comment);Seated upright at 90 degrees   CHL IP OTHER RECOMMENDATIONS 12/20/2016 Recommended Consults -- Oral Care Recommendations Oral care BID Other Recommendations --   CHL IP FOLLOW UP RECOMMENDATIONS 12/20/2016 Follow up Recommendations Other (comment)   No flowsheet data found.     CHL IP ORAL PHASE 12/20/2016 Oral Phase WFL Oral - Pudding Teaspoon -- Oral - Pudding Cup -- Oral - Honey Teaspoon -- Oral - Honey Cup -- Oral - Nectar Teaspoon -- Oral - Nectar Cup -- Oral - Nectar Straw -- Oral - Thin Teaspoon -- Oral - Thin Cup -- Oral - Thin Straw -- Oral - Puree -- Oral - Mech Soft -- Oral - Regular -- Oral - Multi-Consistency -- Oral - Pill -- Oral Phase - Comment --  CHL IP PHARYNGEAL PHASE 12/20/2016 Pharyngeal Phase WFL Pharyngeal- Pudding Teaspoon -- Pharyngeal -- Pharyngeal- Pudding Cup -- Pharyngeal -- Pharyngeal- Honey Teaspoon --  Pharyngeal -- Pharyngeal- Honey Cup -- Pharyngeal -- Pharyngeal- Nectar Teaspoon -- Pharyngeal -- Pharyngeal- Nectar Cup -- Pharyngeal -- Pharyngeal- Nectar Straw -- Pharyngeal -- Pharyngeal- Thin Teaspoon -- Pharyngeal -- Pharyngeal- Thin Cup -- Pharyngeal -- Pharyngeal- Thin Straw -- Pharyngeal -- Pharyngeal- Puree -- Pharyngeal -- Pharyngeal- Mechanical Soft -- Pharyngeal -- Pharyngeal- Regular -- Pharyngeal -- Pharyngeal- Multi-consistency -- Pharyngeal -- Pharyngeal- Pill -- Pharyngeal -- Pharyngeal Comment --  CHL IP CERVICAL ESOPHAGEAL PHASE 12/20/2016 Cervical Esophageal Phase WFL Pudding Teaspoon -- Pudding Cup -- Honey Teaspoon -- Honey Cup -- Nectar Teaspoon -- Nectar Cup -- Nectar Straw -- Thin Teaspoon -- Thin Cup -- Thin Straw -- Puree -- Mechanical Soft -- Regular -- Multi-consistency -- Pill -- Cervical Esophageal Comment -- No flowsheet data found. Germain Osgood 12/20/2016, 9:44 AM  Germain Osgood, M.A. CCC-SLP [phone]              Time Spent in minutes  25   Louellen Molder M.D on 12/28/2016 at 3:55 PM  Between 7am to 7pm - Pager - [phone]  After 7pm go to [email or web address] - password [facility]  [facility] -  Office  [phone]

## 2016-12-28 NOTE — Progress Notes (Signed)
  Speech Language Pathology Treatment: Dysphagia;Cognitive-Linquistic  Patient Details Name: Terry Juarez MRN: [id number] DOB: 1965/07/18 Today's Date: 12/28/2016 Time:  -     Assessment / Plan / Recommendation Clinical Impression  Pt presents with intermittent throat clearing, as seen in initial BSE, found to be inconsistent with any penetration or aspiration on MBS. Given time post extubation expect pt to be again at this baseline function. Pt is tolerating soft solid well without pocketing. Pt also observed to struggle with function tasks at midline or left, but with visual and verbal cues, did find objects and made eye contact with SLP on left for 5 seconds. Affect is flat, but pts cognition is very good for complex biographical information. She needs verbal cues for intellectual and emergent awareness of deficits. Will continue efforts. Recommend CIR at d/c for coordinated care.   HPI HPI: Terry Juarez with history of ESRD on Peritoneal Dialysis, HTN, Right renal mass, and Anemia, who was found down on the floor seizing. She was found to have a large right frontal hemorrhage with surrounding edema and mass effect, also with small SAH and signs of PRES. Pt had MBS 6/17 recommending regular diet and thin liquids. Patient's status declined requiring intubation on 6/21-6/22 and so SLP ordered to re-assess swallow function s/p this intubation.      SLP Plan  MBS       Recommendations  Diet recommendations: Dysphagia 3 (mechanical soft);Thin liquid Liquids provided via: Cup;Straw Medication Administration: Whole meds with liquid Supervision: Patient able to self feed Compensations: Slow rate;Small sips/bites Postural Changes and/or Swallow Maneuvers: Seated upright 90 degrees                General recommendations: Rehab consult Follow up Recommendations: Inpatient Rehab SLP Visit Diagnosis: Dysphagia, oropharyngeal phase (R13.12) Plan: MBS       GO               Herbie Baltimore, MA CCC-SLP [phone]  Lynann Beaver 12/28/2016, 3:34 PM

## 2016-12-28 NOTE — Progress Notes (Signed)
Subjective:   completed PD overnight no issues, BP's stable  Objective Vital signs in last 24 hours: Vitals:   12/27/16 2311 12/28/16 0356 12/28/16 0500 12/28/16 0836  BP: 138/82 (!) 158/80  (!) 150/89  Pulse: 71 71  80  Resp: 15 16  13   Temp: 98.1 F (36.7 C) 98 F (36.7 C)  98.5 F (36.9 C)  TempSrc: Oral Oral  Axillary  SpO2: 94% 93%  94%  Weight:   56.5 kg (124 lb 9 oz)   Height:       Weight change: 1.6 kg (3 lb 8.4 oz) No intake or output data in the 24 hours ending 12/28/16 1012  Summary: Pt is a 51 y.o. yo female with ESRD who was admitted on 12/19/2016 with sz/hemiparesis- found to have right sided frontal lobe hemorrhage   Assess/Plan: 1. Intracranial hemorrhage- did not require surgery /PRES also present- was making some clinical recovery but then had "mini-code" at dialysis on 6/22 and spent time in ICU, now on SDU. HD was stopped due to recurrent BP issues (high/ low= labile) and now is back on PD 2. ESRD - normally on PD as OP. Cont PD for now.  3. Anemia- history of GIB and low hgb- has responded remarkable well to ESA and that is part of reason for hypertension 4. MBD of CKD- resumed phoslo, calcitriol and sensipar  5. HTN/volume- on 5 bp meds now (was on 3 at home).  Vol /wt's are up a bit at 56kg.  Will use half 1.5 % and half 2.5% with PD for now.    [name] [employer] pgr [phone]   12/28/2016, 10:16 AM        Labs: Basic Metabolic Panel:  Recent Labs Lab 12/25/16 0245 12/26/16 0443 12/27/16 0248 12/28/16 0317  NA 134* 137 137 136  K 4.8 4.4 4.7 4.4  CL 99* 99* 99* 100*  CO2 20* 21* 22 22  GLUCOSE 122* 113* 116* 113*  BUN 65* 59* 60* 54*  CREATININE 10.94* 11.08* 11.78* 10.86*  CALCIUM 8.8* 8.9 9.1 9.5  PHOS 7.5* 10.1* 9.5*  --    Liver Function Tests:  Recent Labs Lab 12/22/16 0742 12/27/16 0248  ALT 14  --   ALBUMIN  --  2.6*   No results for input(s): LIPASE, AMYLASE in the last 168  hours.  Recent Labs Lab 12/22/16 1048  AMMONIA 38*   CBC:  Recent Labs Lab 12/23/16 0253 12/24/16 0508 12/24/16 1238 12/25/16 0245 12/26/16 0443  WBC 7.6 7.9 9.9 10.6* 10.3  HGB 17.4* 17.7* 18.0* 18.7* 17.6*  HCT 53.4* 55.0* 55.3* 55.9* 55.7*  MCV 101.1* 100.9* 100.2* 98.9 102.6*  PLT 247 278 253 159 218   Cardiac Enzymes: No results for input(s): CKTOTAL, CKMB, CKMBINDEX, TROPONINI in the last 168 hours. CBG:  Recent Labs Lab 12/22/16 0803 12/24/16 1108  GLUCAP 108* 148*    Iron Studies: No results for input(s): IRON, TIBC, TRANSFERRIN, FERRITIN in the last 72 hours. Studies/Results: No results found. Medications: Infusions: . sodium chloride Stopped (12/26/16 1730)  . dialysis solution 1.5% low-MG/low-CA    . levETIRAcetam Stopped (12/27/16 2300)    Scheduled Medications: . amLODipine  10 mg Oral Daily  . calcitRIOL  0.25 mcg Oral Q M,W,F-1800  . calcium acetate  1,334 mg Oral TID WC  . carvedilol  12.5 mg Oral BID WC  . cinacalcet  60 mg Oral Q supper  . cloNIDine  0.1 mg Oral TID  .  famotidine  20 mg Oral Daily  . gentamicin cream  1 application Topical Daily  . hydrALAZINE  25 mg Oral Q8H  . levothyroxine  88 mcg Oral QAC breakfast  . losartan  100 mg Oral QHS  . mouth rinse  15 mL Mouth Rinse BID  . multivitamin  1 tablet Oral Daily  . pantoprazole  40 mg Oral BID  . senna-docusate  1 tablet Oral BID    have reviewed scheduled and prn medications.  Physical Exam: General: much more alert "hey doc" Heart: RRR Lungs: mostly clear Abdomen: soft, PD cath in place Extremities: minimal edema Dialysis Access: PD cath and right upper ARM AVG     12/28/2016,10:12 AM  LOS: 9 days

## 2016-12-28 NOTE — Progress Notes (Signed)
Pt received from 4W at 16:40pm. Received report from nurse, Hildred Alamin. Pt drowsy but follows commands. Telemetry monitoring. Pt oriented to room. Safety measures in place. Family at bedside. Call bell within reach. Will continue to monitor.

## 2016-12-28 NOTE — Progress Notes (Signed)
Inpatient Rehabilitation  Therapies worked with patient and are continuing to recommend IP Rehab.  Met with patient to discuss team's recommendation.  Shared booklets and answered initial questions.  Will continue to follow for timing of medical readiness, insurance authorization, and bed availability.  Please call with questions.  Carmelia Roller., CCC/SLP Admission Coordinator  [place]  Cell [phone]

## 2016-12-28 NOTE — Plan of Care (Signed)
Problem: Self-Care: Goal: Ability to participate in self-care as condition permits will improve Outcome: Progressing Patient participating in self-care, such as brushing teeth independently.

## 2016-12-28 NOTE — Progress Notes (Signed)
Inpatient Rehabilitation  Continuing to follow for medical stability and post acute discharge needs.  Await recommendations from PT and OT.  Please call if questions.  Carmelia Roller., CCC/SLP Admission Coordinator  [place]  Cell [phone]

## 2016-12-28 NOTE — Progress Notes (Signed)
Occupational Therapy Treatment Patient Details Name: Terry Juarez MRN: [id number] DOB: 01/06/1966 Today's Date: 12/28/2016    History of present illness Pt is a 51 y/o female who presents s/p witnessed seizure at home with new L-sided weakness. No further seizure activity noted since admission. Imaging revealed R frontal lobe hemorrhage. Coded in hemodiaysis   OT comments  This 51 yo female admitted with above presents to acute OT with eagerness to work on sitting and to get OOB. She has a difficult time finding, grading, and maintaining trunk control at EOB and standing. We worked today on sitting and standing balance as pre-cursors to basic ADLs. She will continue to benefit from acute OT with follow up OT on CIR.  Follow Up Recommendations  CIR;Supervision/Assistance - 24 hour    Equipment Recommendations  Other (comment) (TBD at next venue)       Precautions / Restrictions Precautions Precautions: Fall Precaution Comments: Peritoneal dialysis patient Restrictions Weight Bearing Restrictions: No       Mobility Bed Mobility Overal bed mobility: Needs Assistance Bed Mobility: Rolling;Sidelying to Sit Rolling: Mod assist (to right with VCs to reach for rail with LUE and pull herself over) Sidelying to sit: Max assist       General bed mobility comments: Once told pt what she needed to do to roll she did help initiate this, as well as bringing her RLE off of bed and pushing up to sit from left sidelying  Transfers Overall transfer level: Needs assistance Equipment used: 2 person hand held assist Transfers: Sit to/from [employer] Transfers Sit to Stand: Mod assist;+2 physical assistance Stand pivot transfers: Total assist;+2 physical assistance       General transfer comment: Left knee in hyperextension in standing    Balance Overall balance assessment: Needs assistance Sitting-balance support: Feet supported;Single extremity supported Sitting balance-Leahy  Scale: Poor Sitting balance - Comments: pt unable to achieve midline on her own and if we A'd her to find it then she would either push to the L with RUE on the bed, falling forward to left, or fall posteriorly. Worked with her sitting balance with support input at left rib cage and holding head up, while she held on with LUE or placed her LUE where we asked her to (ie: on the chair rail, on her knee)   Standing balance support: Bilateral upper extremity supported Standing balance-Leahy Scale: Zero Standing balance comment: +2 required; worked on standing upright with head up                           ADL either performed or assessed with clinical judgement   ADL Overall ADL's : Needs assistance/impaired Eating/Feeding: Supervision/ safety (to reach for can of sprite and bring to mouth to drink) Eating/Feeding Details (indicate cue type and reason): with total A for balance while seated EOB                                         Vision   Additional Comments: Significant right gaze but able to cross midline to look left with mod cues and following object (pink pen), upward and downward gaze WNL. When reaching to grasp pen she often would not hit target on first attempt (sometime she was a much as a couple inches off and other times only a few centimeters). Pt says she is seeing  only 1 and is not attempting to close one eye (when asked if she can close one eye indepedent of the other she tries, but cannot)          Cognition Arousal/Alertness: Awake/alert Behavior During Therapy: Flat affect Overall Cognitive Status: Impaired/Different from baseline Area of Impairment: Orientation;Following commands;Safety/judgement;Problem solving                 Orientation Level: Disoriented to;Time     Following Commands: Follows one step commands inconsistently;Follows one step commands with increased time (often needs more than the 1 cue and sometimes multi-modal  cues) Safety/Judgement: Decreased awareness of safety;Decreased awareness of deficits   Problem Solving: Slow processing;Decreased initiation;Difficulty sequencing;Requires verbal cues;Requires tactile cues General Comments: Following one-step commands with increased time. Oriented to person, place, month, and year. Level of arousal impacting ability to accurately assess cognition.                    Pertinent Vitals/ Pain       Pain Assessment: No/denies pain         Frequency  Min 3X/week        Progress Toward Goals  OT Goals(current goals can now be found in the care plan section)  Progress towards OT goals: Progressing toward goals     Plan Discharge plan remains appropriate    Co-evaluation    PT/OT/SLP Co-Evaluation/Treatment: Yes Reason for Co-Treatment: Complexity of the patient's impairments (multi-system involvement);To address functional/ADL transfers;For patient/therapist safety   OT goals addressed during session: ADL's and self-care;Strengthening/ROM      AM-PAC PT "6 Clicks" Daily Activity     Outcome Measure   Help from another person eating meals?: A Lot Help from another person taking care of personal grooming?: A Lot Help from another person toileting, which includes using toliet, bedpan, or urinal?: Total Help from another person bathing (including washing, rinsing, drying)?: A Lot Help from another person to put on and taking off regular upper body clothing?: Total Help from another person to put on and taking off regular lower body clothing?: Total 6 Click Score: 9    End of Session Equipment Utilized During Treatment: Gait belt  OT Visit Diagnosis: Hemiplegia and hemiparesis;Other symptoms and signs involving cognitive function Hemiplegia - Right/Left: Left Hemiplegia - dominant/non-dominant: Non-Dominant Hemiplegia - caused by: Nontraumatic intracerebral hemorrhage   Activity Tolerance Patient tolerated treatment well   Patient  Left in chair;with call bell/phone within reach;with chair alarm set   Nurse Communication Mobility status;Need for lift equipment        Time: 2831-5176 OT Time Calculation (min): 40 min  Charges: OT General Charges $OT Visit: 1 Procedure OT Treatments $Therapeutic Activity: 23-37 mins  [address], OTR/L [phone] 12/28/2016

## 2016-12-29 DIAGNOSIS — K5900 Constipation, unspecified: Secondary | ICD-10-CM

## 2016-12-29 LAB — BASIC METABOLIC PANEL
Anion gap: 14 (ref 5–15)
BUN: 52 mg/dL — AB (ref 6–20)
CO2: 24 mmol/L (ref 22–32)
CREATININE: 10.91 mg/dL — AB (ref 0.44–1.00)
Calcium: 9.5 mg/dL (ref 8.9–10.3)
Chloride: 99 mmol/L — ABNORMAL LOW (ref 101–111)
GFR calc Af Amer: 4 mL/min — ABNORMAL LOW (ref >60)
GFR, EST NON AFRICAN AMERICAN: 4 mL/min — AB (ref >60)
GLUCOSE: 124 mg/dL — AB (ref 65–99)
Potassium: 3.8 mmol/L (ref 3.5–5.1)
SODIUM: 137 mmol/L (ref 135–145)

## 2016-12-29 LAB — MAGNESIUM: MAGNESIUM: 1.9 mg/dL (ref 1.7–2.4)

## 2016-12-29 MED ORDER — POLYETHYLENE GLYCOL 3350 17 G PO PACK
17.0000 g | PACK | Freq: Every day | ORAL | Status: DC
  Administered 2016-12-29 – 2017-01-04 (×6): 17 g via ORAL
  Filled 2016-12-29 (×6): qty 1

## 2016-12-29 MED ORDER — ACETAMINOPHEN 325 MG PO TABS
650.0000 mg | ORAL_TABLET | Freq: Four times a day (QID) | ORAL | Status: DC | PRN
  Administered 2016-12-29: 650 mg via ORAL
  Filled 2016-12-29 (×3): qty 2

## 2016-12-29 NOTE — Progress Notes (Signed)
PROGRESS NOTE                                                                                                                                                                                                             Patient Demographics:    Terry Juarez, is a 51 y.o. female, DOB - 1965-09-07, TDD:[id number]  Admit date - 12/19/2016   Admitting Physician Reyne Dumas, MD  Outpatient Primary MD for the patient is Tisovec, Fransico Him, MD  LOS - 10    Chief Complaint  Patient presents with  . Seizures       Brief Narrative   51 year old female with ESRD on peritoneal dialysis, renal cell carcinoma status post right nephrectomy, hypertension, hyperlipidemia and anemia was found down on the floor actively seizing by her roommate. She was brought to the ED with symptoms of right frontal headache and acute left-sided weakness. Head CT showed large 29 mm right frontal hemorrhage with surrounding edema and mass effect along with small subarachnoid hemorrhages and signs of PRES. CT angiogram head and neck showed no AVMs but with increase in size of the hematoma. Patient to my neurology in the ED and started on nicardipine and  Keppra. MRI of the brain showed no change in the size of right frontal hematoma and also conformed PRES.  Last head CT on 6/21 showed stable right frontal hemorrhage with evolving surrounding edema and stable 3-4 mm midline shift.  Patient transferred to hospitalist service on 6/24.   Subjective:   Patient is slow to communicate but is alert and oriented. Still has not had bowel movement since admission. Denies headache or dizziness.   Assessment  & Plan :    Active Problems:   Intracranial hemorrhage (HCC) Right posterior frontal lobe acute hemorrhage with surrounding edema and local mass effect.  CT and MRI findings suggestive of PRES in the setting of hypertension. Also has subarachnoid  hemorrhage. No vascular explanation for right frontoparietal hematoma. Has  residual left hemiparesis. 2-D echo without cardiac source of emboli. EEG shows diffuse slowing. No antithrombotic or anticoagulation due to hemorrhage. Appreciate neurology's recommendation. Aggressive stroke risk factor management. PT recommends CIR. insurance authorization initiated. Follow-up with Dr Erlinda Hong in 6 weeks.  Acute seizures Likely triggered by intracranial hemorrhage. Continue empiric Keppra. EEG shows diffuse slowing.      PRES (  posterior reversible encephalopathy syndrome) with hypertensive emergency SBP goal less than 160. Currently on 5 different blood pressure medications well controlled. Titrate as tolerated.  Cardiac arrest on 6/22 Short cardiac arrest of 30 seconds while at hemodialysis on 6/22. Plan on no further attempt to hemodialysis due to labile blood pressure.  Acute respiratory failure with hypoxia Multifactorial secondary to hypertensive emergency, seizures and ICH Currently stable on room air.  ESRD on PD Not tolerating hemodialysis. Renal consult appreciated. Resumed Sensipar, calcitriol and PhosLo.  History of renal cell carcinoma status post right nephrectomy  Polycythemia Etiology unclear. Monitor for now. Prior history of GI bleed with anemia, has responded well to ESA.   Constipation Will order enema.    Code Status : Full code  Family Communication  : Mother at bedside  Disposition Plan  : Stable for discharge to CIR.  Barriers For Discharge : Pending insurance authorization.  Consults  :   Neurology Nephrology CIR   Procedures  :  Hemodialysis CT head CT angiogram head and neck MRI brain  DVT Prophylaxis  : SCDs  Lab Results  Component Value Date   PLT 218 12/26/2016    Antibiotics  :   Anti-infectives    None        Objective:   Vitals:   12/29/16 0815 12/29/16 0839 12/29/16 0944 12/29/16 1019  BP: (!) 159/88 (!) 152/89 (!) 155/89    Pulse: 85 83 75   Resp: 16  18   Temp: 98 F (36.7 C)   98.6 F (37 C)  TempSrc: Oral   Oral  SpO2:   94%   Weight: 55.6 kg (122 lb 9.2 oz)     Height:        Wt Readings from Last 3 Encounters:  12/29/16 55.6 kg (122 lb 9.2 oz)  09/04/16 54.4 kg (120 lb)  08/02/16 56.9 kg (125 lb 7.1 oz)     Intake/Output Summary (Last 24 hours) at 12/29/16 1254 Last data filed at 12/29/16 0815  Gross per 24 hour  Intake            10066 ml  Output            11055 ml  Net             -989 ml     Physical Exam Gen.: Fatigue, noted distress, HEENT: Moist mucosa, supple neck Chest: Clear bilaterally CVS: Normal S1 and S2, no murmurs GI: Soft, nondistended, nontender Musculoskeletal: Warm, no edema CNS: Alert and oriented, left-sided hemiparesis, intact sensations   Data Review:    CBC  Recent Labs Lab 12/23/16 0253 12/24/16 0508 12/24/16 1238 12/25/16 0245 12/26/16 0443  WBC 7.6 7.9 9.9 10.6* 10.3  HGB 17.4* 17.7* 18.0* 18.7* 17.6*  HCT 53.4* 55.0* 55.3* 55.9* 55.7*  PLT 247 278 253 159 218  MCV 101.1* 100.9* 100.2* 98.9 102.6*  MCH 33.0 32.5 32.6 33.1 32.4  MCHC 32.6 32.2 32.5 33.5 31.6  RDW 15.3 15.5 15.3 15.4 15.8*    Chemistries   Recent Labs Lab 12/24/16 0508 12/25/16 0245 12/26/16 0443 12/27/16 0248 12/28/16 0317 12/29/16 0532  NA 136 134* 137 137 136 137  K 5.5* 4.8 4.4 4.7 4.4 3.8  CL 100* 99* 99* 99* 100* 99*  CO2 19* 20* 21* 22 22 24   GLUCOSE 87 122* 113* 116* 113* 124*  BUN 88* 65* 59* 60* 54* 52*  CREATININE 13.76* 10.94* 11.08* 11.78* 10.86* 10.91*  CALCIUM 8.8* 8.8* 8.9 9.1 9.5 9.5  MG 2.6* 2.4 2.2  --  2.0 1.9   ------------------------------------------------------------------------------------------------------------------  Recent Labs  12/28/16 0317  TRIG 141    No results found for: HGBA1C ------------------------------------------------------------------------------------------------------------------ No results for input(s):  TSH, T4TOTAL, T3FREE, THYROIDAB in the last 72 hours.  Invalid input(s): FREET3 ------------------------------------------------------------------------------------------------------------------ No results for input(s): VITAMINB12, FOLATE, FERRITIN, TIBC, IRON, RETICCTPCT in the last 72 hours.  Coagulation profile No results for input(s): INR, PROTIME in the last 168 hours.  No results for input(s): DDIMER in the last 72 hours.  Cardiac Enzymes No results for input(s): CKMB, TROPONINI, MYOGLOBIN in the last 168 hours.  Invalid input(s): CK ------------------------------------------------------------------------------------------------------------------ No results found for: BNP  Inpatient Medications  Scheduled Meds: . amLODipine  10 mg Oral Daily  . calcitRIOL  0.25 mcg Oral Q M,W,F-1800  . calcium acetate  1,334 mg Oral TID WC  . carvedilol  12.5 mg Oral BID WC  . cinacalcet  60 mg Oral Q supper  . cloNIDine  0.1 mg Oral TID  . famotidine  20 mg Oral Daily  . gentamicin cream  1 application Topical Daily  . hydrALAZINE  25 mg Oral Q8H  . levothyroxine  88 mcg Oral QAC breakfast  . losartan  100 mg Oral QHS  . mouth rinse  15 mL Mouth Rinse BID  . multivitamin  1 tablet Oral Daily  . pantoprazole  40 mg Oral BID  . senna-docusate  1 tablet Oral BID   Continuous Infusions: . sodium chloride Stopped (12/26/16 1730)  . dialysis solution 1.5% low-MG/low-CA    . dialysis solution 2.5% low-MG/low-CA    . dialysis solution 2.5% low-MG/low-CA    . levETIRAcetam 500 mg (12/29/16 0945)   PRN Meds:.sodium chloride, dianeal solution for CAPD/CCPD with heparin, dianeal solution for CAPD/CCPD with heparin, hydrALAZINE, labetalol  Micro Results Recent Results (from the past 240 hour(s))  MRSA PCR Screening     Status: None   Collection Time: 12/20/16  1:50 AM  Result Value Ref Range Status   MRSA by PCR NEGATIVE NEGATIVE Final    Comment:        The GeneXpert MRSA Assay  (FDA approved for NASAL specimens only), is one component of a comprehensive MRSA colonization surveillance program. It is not intended to diagnose MRSA infection nor to guide or monitor treatment for MRSA infections.     Radiology Reports Ct Angio Head W Or Wo Contrast  Result Date: 12/19/2016 CLINICAL DATA:  Intraparenchymal and subarachnoid hemorrhage. EXAM: CT ANGIOGRAPHY HEAD AND NECK TECHNIQUE: Multidetector CT imaging of the head and neck was performed using the standard protocol during bolus administration of intravenous contrast. Multiplanar CT image reconstructions and MIPs were obtained to evaluate the vascular anatomy. Carotid stenosis measurements (when applicable) are obtained utilizing NASCET criteria, using the distal internal carotid diameter as the denominator. CONTRAST:  50 cc Isovue 370 intravenous COMPARISON:  Noncontrast head CT from earlier today FINDINGS: CTA NECK FINDINGS Aortic arch: Atheromatous wall thickening that is mild. No acute finding or aneurysm. Right carotid system: Mild atheromatous plaque at the common carotid bifurcation without stenosis or ulceration. Beaded ICA consistent with fibromuscular dysplasia. No superimposed acute dissection. Left carotid system: Scattered atheromatous plaque on the distal common carotid and proximal ICA, moderate for age. No flow limiting stenosis or ulceration. Beading of the left ICA consistent with fibromuscular dysplasia. No superimposed dissection. Vertebral arteries: Proximal subclavian atherosclerosis without flow limiting stenosis. Both vertebral arteries are smooth and widely patent to the dura. Left vertebral dominance. Skeleton: No acute  or aggressive finding. Other neck: Negative for mass or inflammation. Upper chest: Negative Review of the MIP images confirms the above findings CTA HEAD FINDINGS Anterior circulation: Moderate calcified plaque on the carotid siphons without flow limiting stenosis. Standard branching.  There is no evidence of aneurysm or vascular malformation. No major branch occlusion or proximal flow limiting stenosis. Posterior circulation: Left dominant vertebral artery. The vertebral and basilar arteries are smooth and widely patent. Aplastic right and mildly hypoplastic left P1 segments with large posterior communicating arteries. No branch occlusion, stenosis, aneurysm, or evidence of vascular malformation. Venous sinuses: Patent on the delayed phase. Anatomic variants: Fetal type PCA circulation, especially on the right. Delayed phase: No masslike enhancement. Pending brain MRI. The right frontal parietal hematoma is larger than before, measuring 27 x 34 x 35 mm as compared to 20 x 31 x 28 mm. Local subarachnoid hemorrhage and mild cerebral edema without shift or herniation. Review of the MIP images confirms the above findings IMPRESSION: 1. No vascular explanation for the right frontal parietal hematoma. Dural venous sinuses are patent and there is no evidence of vascular malformation. 2. The 16 cc hematoma is mildly increased from earlier CT. Negative for shift or herniation. 3. Fibromuscular dysplasia of the bilateral cervical ICA. 4. Moderate atherosclerosis for age. Negative for flow limiting stenosis. Electronically Signed   By: Monte Fantasia M.D.   On: 12/19/2016 19:37   Ct Head Wo Contrast  Result Date: 12/24/2016 CLINICAL DATA:  Intraparenchymal hemorrhage. EXAM: CT HEAD WITHOUT CONTRAST TECHNIQUE: Contiguous axial images were obtained from the base of the skull through the vertex without intravenous contrast. COMPARISON:  Multiple prior head CTs, most recently 12/22/2016 FINDINGS: Brain: The right frontal hemorrhage is stable in size. Evolving surrounding edema is noted. 3-4 mm of midline shift is not significantly changed. No new hemorrhage is present. The subarachnoid hemorrhage is not significantly changed. Vascular: Atherosclerotic calcifications are again noted. Skull: Calvarium is  intact. Sinuses/Orbits: The paranasal sinuses and mastoid air cells are clear. IMPRESSION: 1. Stable right frontal hemorrhage with evolving surrounding edema. 2. Stable 3-4 mm of midline shift. Electronically Signed   By: San Morelle M.D.   On: 12/24/2016 12:22   Ct Head Wo Contrast  Result Date: 12/22/2016 CLINICAL DATA:  Patient admitted for [place], stroke. MRI demonstrated PRES. EXAM: CT HEAD WITHOUT CONTRAST TECHNIQUE: Contiguous axial images were obtained from the base of the skull through the vertex without intravenous contrast. COMPARISON:  Multiple priors, most recent earlier today at 3:55 a.m. FINDINGS: Brain: Redemonstrated is an intraparenchymal hemorrhage, not significantly changed in size or shape, roughly 4 x 3 x 5 cm. Mild surrounding vasogenic edema is not increased. Slight effacement RIGHT lateral ventricle. Unchanged midline shift RIGHT to LEFT, roughly 3 mm. No hydrocephalus or ventricular trapping. Effacement of the basilar cisterns redemonstrated. Vascular: No hyperdense vessel or unexpected calcification. Skull: Normal. Negative for fracture or focal lesion. Sinuses/Orbits: No acute finding. Other: None. IMPRESSION: Stable intraparenchymal hemorrhage. Subtle signs of midline shift, and slight increased pressure. But no significant worsening from priors. Electronically Signed   By: Staci Righter M.D.   On: 12/22/2016 09:47   Ct Head Wo Contrast  Result Date: 12/22/2016 CLINICAL DATA:  Follow-up examination for acute intracranial hemorrhage. EXAM: CT HEAD WITHOUT CONTRAST TECHNIQUE: Contiguous axial images were obtained from the base of the skull through the vertex without intravenous contrast. COMPARISON:  Prior CT from 12/21/2016. FINDINGS: Brain: Intraparenchymal hemorrhage centered at the high right frontal parietal region again seen. Hemorrhage  overall not significantly changed measuring 4.1 x 3.3 x 5.4 cm seen greatest dimensions (previously 4.1 x 3.4 x 5.5 cm). Surrounding  low-density vasogenic edema relatively with sulcal effacement relatively similar. Mass effect on the right lateral ventricle which is partially effaced relatively unchanged. Right-to-left midline shift measures approximately 3 mm, similar. No hydrocephalus or ventricular trapping. No intraventricular extension of hemorrhage. Adjacent small volume subarachnoid hemorrhage noted, similar. Adjacent extra-axial hemorrhage unchanged. No new acute intracranial hemorrhage. No evidence for acute large vessel territory infarct. Vascular: No hyperdense vessel. Scattered vascular calcifications noted within the carotid siphons. Skull: Scalp soft tissues and calvarium unchanged, and remain within normal limits. Sinuses/Orbits: Globes and orbital soft tissues within normal limits. Paranasal sinuses and mastoid air cells remain clear. IMPRESSION: 1. No significant interval change in posterior right frontal intraparenchymal hematoma with extra-axial extension. Similar localized edema and mass effect with 3 mm right-to-left shift. 2. No other new acute intracranial process. Electronically Signed   By: Jeannine Boga M.D.   On: 12/22/2016 04:49   Ct Head Wo Contrast  Result Date: 12/21/2016 CLINICAL DATA:  Intracranial hemorrhage. EXAM: CT HEAD WITHOUT CONTRAST TECHNIQUE: Contiguous axial images were obtained from the base of the skull through the vertex without intravenous contrast. COMPARISON:  CT head without contrast 12/20/2016 and 12/19/2016. FINDINGS: Brain: The focal hyperdense hemorrhage within the high right frontal lobe extends to the cortex. The area of hemorrhage continues to slowly increase in size. Maximal dimensions on coronal and sagittal reformats are now 4.1 x 3.4 by 5.5 cm. This compares with previous measurements of 3.8 x 8 3.3 x 5.2 cm and is significantly larger than the regional measurements. Adjacent extra-axial hemorrhage is similar the prior study. Mild edema surrounds the hemorrhage. Local mass  effect is present with effacement of the sulci. Minimal midline shift is present. There is increasing effacement of the right lateral ventricle. Vascular: Atherosclerotic calcifications are present within the cavernous internal carotid arteries. There is no hyperdense vessel. Skull: The calvarium is intact. No focal lytic or blastic lesions are present. Sinuses/Orbits: The paranasal sinuses and mastoid air cells are clear. The globes and orbits are unremarkable. IMPRESSION: 1. Continued increase in size of right frontal parenchymal hemorrhage with extra-axial extension. 2. Slight increase in associated mass effect. These results were called by telephone at the time of interpretation on 12/21/2016 at 9:21 am to Dr. Rosalin Hawking , who verbally acknowledged these results. Electronically Signed   By: San Morelle M.D.   On: 12/21/2016 09:29   Ct Head Wo Contrast  Result Date: 12/20/2016 CLINICAL DATA:  Intracranial hemorrhage follow up EXAM: CT HEAD WITHOUT CONTRAST TECHNIQUE: Contiguous axial images were obtained from the base of the skull through the vertex without intravenous contrast. COMPARISON:  Brain MRI 12/19/2016 Head CT 12/19/2016 Head CTA 12/19/2016 FINDINGS: Brain: Intraparenchymal hematoma centered in the right frontal lobe now measures 3.8 x 3.4 x 3.8 cm, previously 3.7 x 3.8 by 3.0 cm. Surrounding edema is unchanged. There is subarachnoid blood over the right convexity, also unchanged. There is no midline shift. No hydrocephalus. Basal cisterns remain patent. Vascular: No hyperdense vessel or unexpected calcification. Skull: Normal visualized skull base, calvarium and extracranial soft tissues. Sinuses/Orbits: No sinus fluid levels or advanced mucosal thickening. No mastoid effusion. Normal orbits. IMPRESSION: 1. Slightly increased size of right frontal intraparenchymal hematoma with unchanged right convexity subarachnoid blood, compared to head CTA performed 12/19/2016. Compared to the initial  head CT from this presentation, obtained at 5:02 p.m. on 12/19/2016, the hematoma  has increased in size. 2. No new hemorrhage, hydrocephalus or mass effect. Electronically Signed   By: Ulyses Jarred M.D.   On: 12/20/2016 06:29   Ct Head Wo Contrast  Result Date: 12/19/2016 CLINICAL DATA:  51 y/o  F; seizure. EXAM: CT HEAD WITHOUT CONTRAST CT CERVICAL SPINE WITHOUT CONTRAST TECHNIQUE: Multidetector CT imaging of the head and cervical spine was performed following the standard protocol without intravenous contrast. Multiplanar CT image reconstructions of the cervical spine were also generated. COMPARISON:  02/11/2015 CT of the head. FINDINGS: CT HEAD FINDINGS Brain: Right posterior frontal lobe acute hemorrhage measuring 22 x 29 x 21 mm (AP x ML x CC series 3 image 26 and series 5, image 34). Small volume of subarachnoid hemorrhage with overlying the right frontal convexity and along the falx. There is a small surrounding region of hypoattenuation in the brain compatible with edema with local mass effect effacing sulci. No significant midline shift. No evidence for herniation. No large territory acute stroke identified. There are foci of hypoattenuation within subcortical white matter in the bilateral parietal lobes and occipital lobes. The distribution is suggestive of PRES. Vascular: Extensive calcific atherosclerosis of the carotid siphons. Skull: Normal. Negative for fracture or focal lesion. Sinuses/Orbits: No acute finding. Other: None. CT CERVICAL SPINE FINDINGS Alignment: Normal. Skull base and vertebrae: No acute fracture. No primary bone lesion or focal pathologic process. Soft tissues and spinal canal: No prevertebral fluid or swelling. No visible canal hematoma. Disc levels: Straightening of cervical lordosis and discogenic degenerative changes with mild disc space narrowing and marginal osteophytes from the C3 through C5 levels. Upper chest: Negative. Other: Negative appear IMPRESSION: 1. Right  posterior frontal lobe acute hemorrhage measuring up to 29 mm with small area of surrounding edema and local mass effect. No significant midline shift or herniation. Follow-up to ensure resolution is recommended to exclude an underlying mass. 2. Small volume of subarachnoid hemorrhage overlying the right frontal convexity. 3. Hypoattenuation within subcortical white matter and bilateral parietal and occipital lobes. Findings are suggestive of PRES in the setting of hypertension. This can be further characterized with MRI of the brain. 4. No acute fracture or dislocation of the cervical spine. These results were called by telephone at the time of interpretation on 12/19/2016 at 5:12 pm to Dr. Lajean Saver , who verbally acknowledged these results. Electronically Signed   By: Kristine Garbe M.D.   On: 12/19/2016 17:23   Ct Angio Neck W And/or Wo Contrast  Result Date: 12/19/2016 CLINICAL DATA:  Intraparenchymal and subarachnoid hemorrhage. EXAM: CT ANGIOGRAPHY HEAD AND NECK TECHNIQUE: Multidetector CT imaging of the head and neck was performed using the standard protocol during bolus administration of intravenous contrast. Multiplanar CT image reconstructions and MIPs were obtained to evaluate the vascular anatomy. Carotid stenosis measurements (when applicable) are obtained utilizing NASCET criteria, using the distal internal carotid diameter as the denominator. CONTRAST:  50 cc Isovue 370 intravenous COMPARISON:  Noncontrast head CT from earlier today FINDINGS: CTA NECK FINDINGS Aortic arch: Atheromatous wall thickening that is mild. No acute finding or aneurysm. Right carotid system: Mild atheromatous plaque at the common carotid bifurcation without stenosis or ulceration. Beaded ICA consistent with fibromuscular dysplasia. No superimposed acute dissection. Left carotid system: Scattered atheromatous plaque on the distal common carotid and proximal ICA, moderate for age. No flow limiting stenosis or  ulceration. Beading of the left ICA consistent with fibromuscular dysplasia. No superimposed dissection. Vertebral arteries: Proximal subclavian atherosclerosis without flow limiting stenosis. Both vertebral arteries  are smooth and widely patent to the dura. Left vertebral dominance. Skeleton: No acute or aggressive finding. Other neck: Negative for mass or inflammation. Upper chest: Negative Review of the MIP images confirms the above findings CTA HEAD FINDINGS Anterior circulation: Moderate calcified plaque on the carotid siphons without flow limiting stenosis. Standard branching. There is no evidence of aneurysm or vascular malformation. No major branch occlusion or proximal flow limiting stenosis. Posterior circulation: Left dominant vertebral artery. The vertebral and basilar arteries are smooth and widely patent. Aplastic right and mildly hypoplastic left P1 segments with large posterior communicating arteries. No branch occlusion, stenosis, aneurysm, or evidence of vascular malformation. Venous sinuses: Patent on the delayed phase. Anatomic variants: Fetal type PCA circulation, especially on the right. Delayed phase: No masslike enhancement. Pending brain MRI. The right frontal parietal hematoma is larger than before, measuring 27 x 34 x 35 mm as compared to 20 x 31 x 28 mm. Local subarachnoid hemorrhage and mild cerebral edema without shift or herniation. Review of the MIP images confirms the above findings IMPRESSION: 1. No vascular explanation for the right frontal parietal hematoma. Dural venous sinuses are patent and there is no evidence of vascular malformation. 2. The 16 cc hematoma is mildly increased from earlier CT. Negative for shift or herniation. 3. Fibromuscular dysplasia of the bilateral cervical ICA. 4. Moderate atherosclerosis for age. Negative for flow limiting stenosis. Electronically Signed   By: Monte Fantasia M.D.   On: 12/19/2016 19:37   Ct Cervical Spine Wo Contrast  Result Date:  12/19/2016 CLINICAL DATA:  51 y/o  F; seizure. EXAM: CT HEAD WITHOUT CONTRAST CT CERVICAL SPINE WITHOUT CONTRAST TECHNIQUE: Multidetector CT imaging of the head and cervical spine was performed following the standard protocol without intravenous contrast. Multiplanar CT image reconstructions of the cervical spine were also generated. COMPARISON:  02/11/2015 CT of the head. FINDINGS: CT HEAD FINDINGS Brain: Right posterior frontal lobe acute hemorrhage measuring 22 x 29 x 21 mm (AP x ML x CC series 3 image 26 and series 5, image 34). Small volume of subarachnoid hemorrhage with overlying the right frontal convexity and along the falx. There is a small surrounding region of hypoattenuation in the brain compatible with edema with local mass effect effacing sulci. No significant midline shift. No evidence for herniation. No large territory acute stroke identified. There are foci of hypoattenuation within subcortical white matter in the bilateral parietal lobes and occipital lobes. The distribution is suggestive of PRES. Vascular: Extensive calcific atherosclerosis of the carotid siphons. Skull: Normal. Negative for fracture or focal lesion. Sinuses/Orbits: No acute finding. Other: None. CT CERVICAL SPINE FINDINGS Alignment: Normal. Skull base and vertebrae: No acute fracture. No primary bone lesion or focal pathologic process. Soft tissues and spinal canal: No prevertebral fluid or swelling. No visible canal hematoma. Disc levels: Straightening of cervical lordosis and discogenic degenerative changes with mild disc space narrowing and marginal osteophytes from the C3 through C5 levels. Upper chest: Negative. Other: Negative appear IMPRESSION: 1. Right posterior frontal lobe acute hemorrhage measuring up to 29 mm with small area of surrounding edema and local mass effect. No significant midline shift or herniation. Follow-up to ensure resolution is recommended to exclude an underlying mass. 2. Small volume of  subarachnoid hemorrhage overlying the right frontal convexity. 3. Hypoattenuation within subcortical white matter and bilateral parietal and occipital lobes. Findings are suggestive of PRES in the setting of hypertension. This can be further characterized with MRI of the brain. 4. No acute fracture or  dislocation of the cervical spine. These results were called by telephone at the time of interpretation on 12/19/2016 at 5:12 pm to Dr. Lajean Saver , who verbally acknowledged these results. Electronically Signed   By: Kristine Garbe M.D.   On: 12/19/2016 17:23   Mr Brain Wo Contrast  Result Date: 12/19/2016 CLINICAL DATA:  Seizure and altered mental status. EXAM: MRI HEAD WITHOUT CONTRAST TECHNIQUE: Multiplanar, multiecho pulse sequences of the brain and surrounding structures were obtained without intravenous contrast. COMPARISON:  CTA head neck from earlier the same day FINDINGS: Brain: Acute parenchymal hematoma in the posterior right frontal lobe is size stable from CT 60 minutes prior. There is subarachnoid FLAIR signal hyperintensity along the right more than left convexity. On the right there is associated susceptibility on gradient imaging correlating with subarachnoid hemorrhage by CT. There is bilateral cortical and subcortical T2 hyperintensity within the predominantly high and posterior cerebral convexities, also seen along the left putamen. No hydrocephalus. No primary infarct findings. Vascular: Major flow voids are preserved. Skull and upper cervical spine: Negative for marrow lesion Sinuses/Orbits: Negative Other: These results were called by telephone at the time of interpretation on 12/19/2016 at 8:30 pm to Dr. Lajean Saver , who verbally acknowledged these results. IMPRESSION: 1. Findings of posterior reversible encephalopathy syndrome. 2. Size stable posterior right frontal hematoma compared to CTA 1 hour prior. Electronically Signed   By: Monte Fantasia M.D.   On: 12/19/2016 20:35    Dg Chest Port 1 View  Result Date: 12/25/2016 CLINICAL DATA:  Intubation. EXAM: PORTABLE CHEST 1 VIEW COMPARISON:  07/29/2016 . FINDINGS: Endotracheal tube noted with its tip 4.1 cm above the carina. Heart size normal. No focal infiltrate. Prominent nipple shadows again noted. No pleural effusion or pneumothorax. IMPRESSION: 1.  Endotracheal tube tip noted 4.1 cm above the carina. 2. No acute cardiopulmonary disease. Electronically Signed   By: Marcello Moores  Register   On: 12/25/2016 07:14   Dg Swallowing Func-speech Pathology  Result Date: 12/20/2016 Objective Swallowing Evaluation: Type of Study: MBS-Modified Barium Swallow Study Patient Details Name: JARELI HIGHLAND MRN: [id number] Date of Birth: 02/03/1966 Today's Date: 12/20/2016 Time: SLP Start Time (ACUTE ONLY): 0914-SLP Stop Time (ACUTE ONLY): 5462 SLP Time Calculation (min) (ACUTE ONLY): 13 min Past Medical History: Past Medical History: Diagnosis Date . Anemia  . Bruises easily  . Dialysis patient ([place])  . ESRD on dialysis ([place])  . Hyperlipidemia  . Hypertension  . Renal disorder   rt renal mass / < functioning of left kidney - being prepared for possible dialysis . Right renal mass  Past Surgical History: Past Surgical History: Procedure Laterality Date . AV FISTULA PLACEMENT Right 07/11/2014  Procedure: ARTERIOVENOUS (AV) FISTULA CREATION RIGHT ARM BRACHIO-CEPHALIC WITH ATTEMPTED RADIO-CEPHALIC (AV) FISTULA;  Surgeon: Mal Misty, MD;  Location: [place];  Service: Vascular;  Laterality: Right; . COLONOSCOPY WITH PROPOFOL N/A 09/04/2016  Procedure: COLONOSCOPY WITH PROPOFOL;  Surgeon: Carol Ada, MD;  Location: [facility] ENDOSCOPY;  Service: Endoscopy;  Laterality: N/A; . ECTOPIC PREGNANCY SURGERY  1987 . ESOPHAGOGASTRODUODENOSCOPY N/A 07/30/2016  Procedure: ESOPHAGOGASTRODUODENOSCOPY (EGD);  Surgeon: Carol Ada, MD;  Location: [facility] ENDOSCOPY;  Service: Endoscopy;  Laterality: N/A;  Bedside . ESOPHAGOGASTRODUODENOSCOPY (EGD) WITH PROPOFOL N/A 09/04/2016  Procedure:  ESOPHAGOGASTRODUODENOSCOPY (EGD) WITH PROPOFOL;  Surgeon: Carol Ada, MD;  Location: [facility] ENDOSCOPY;  Service: Endoscopy;  Laterality: N/A; . INSERTION OF DIALYSIS CATHETER Right 07/11/2014  Procedure: INSERTION OF DIALYSIS CATHETER IN RIGHT INTERNAL JUGULAR ;  Surgeon: Mal Misty, MD;  Location: [facility] OR;  Service: Vascular;  Laterality: Right; . LAPAROSCOPIC NEPHRECTOMY Right 07/25/2014  Procedure: RIGHT LAPAROSCOPIC RADICAL NEPHRECTOMY ;  Surgeon: Ardis Hughs, MD;  Location: [facility] ORS;  Service: Urology;  Laterality: Right; . PATCH ANGIOPLASTY Right 07/11/2014  Procedure: PATCH ANGIOPLASTY OF RIGHT RADIAL ARTERY USING CEPHALIC VEIN.;  Surgeon: Mal Misty, MD;  Location: [place];  Service: Vascular;  Laterality: Right; . THROMBECTOMY W/ EMBOLECTOMY Right 07/11/2014  Procedure: THROMBECTOMY OF RIGHT RADIAL ARTERY  ;  Surgeon: Mal Misty, MD;  Location: [facility] OR;  Service: Vascular;  Laterality: Right; HPI: 51 yo F with history of ESRD on Peritoneal Dialysis, HTN, Right renal mass, and Anemia, who was found down on the floor seizing. She was found to have a large right frontal hemorrhage with surrounding edema and mass effect, also with small SAH and signs of PRES. Pt describes subtle dysphagia and vocal quality changes following thyroid surgery in April. Subjective: pt alert, eager for POs, particularly before she starts dialysis Assessment / Plan / Recommendation [facility] IP CLINICAL IMPRESSIONS 12/20/2016 Clinical Impression Pt's oropharyngeal swallow appears to be [facility], with no aspiration or penetration observed. Of note, she did not appear to have as many audible swallows or throat clearings during MBS as was observed at bedside, but when she did have throat clearing it was no associated with any airway compromise. She fairly consistently completed second swallows, particularly with thin liquids, but there were only trace residuals (mostly a small coating of the tongue/base of tongue) remaining throughout her  oropharynx. Recommend that pt initiate regular textures and thin liquids. SLP f/u not needed for swallowing, but recommend MD order SLP cognitive-linguistic evaluation. SLP Visit Diagnosis Dysphagia, unspecified (R13.10) Attention and concentration deficit following -- Frontal lobe and executive function deficit following -- Impact on safety and function Mild aspiration risk   [facility] IP TREATMENT RECOMMENDATION 12/20/2016 Treatment Recommendations No treatment recommended at this time   Prognosis 12/20/2016 Prognosis for Safe Diet Advancement Good Barriers to Reach Goals -- Barriers/Prognosis Comment -- [facility] IP DIET RECOMMENDATION 12/20/2016 SLP Diet Recommendations Regular solids;Thin liquid Liquid Administration via Cup;Straw Medication Administration Whole meds with liquid Compensations Slow rate;Small sips/bites Postural Changes Remain semi-upright after after feeds/meals (Comment);Seated upright at 90 degrees   CH[facility] IP OTHER RECOMMENDATIONS 12/20/2016 Recommended Consults -- Oral Care Recommendations Oral care BID Other Recommendations --   [facility] IP FOLLOW UP RECOMMENDATIONS 12/20/2016 Follow up Recommendations Other (comment)   No flowsheet data found.     [facility] IP ORAL PHASE 12/20/2016 Oral Phase WFL Oral - Pudding Teaspoon -- Oral - Pudding Cup -- Oral - Honey Teaspoon -- Oral - Honey Cup -- Oral - Nectar Teaspoon -- Oral - Nectar Cup -- Oral - Nectar Straw -- Oral - Thin Teaspoon -- Oral - Thin Cup -- Oral - Thin Straw -- Oral - Puree -- Oral - Mech Soft -- Oral - Regular -- Oral - Multi-Consistency -- Oral - Pill -- Oral Phase - Comment --  CH[facility] IP PHARYNGEAL PHASE 12/20/2016 Pharyngeal Phase WFL Pharyngeal- Pudding Teaspoon -- Pharyngeal -- Pharyngeal- Pudding Cup -- Pharyngeal -- Pharyngeal- Honey Teaspoon -- Pharyngeal -- Pharyngeal- Honey Cup -- Pharyngeal -- Pharyngeal- Nectar Teaspoon -- Pharyngeal -- Pharyngeal- Nectar Cup -- Pharyngeal -- Pharyngeal- Nectar Straw -- Pharyngeal -- Pharyngeal- Thin Teaspoon --  Pharyngeal -- Pharyngeal- Thin Cup -- Pharyngeal -- Pharyngeal- Thin Straw -- Pharyngeal -- Pharyngeal- Puree -- Pharyngeal -- Pharyngeal- Mechanical Soft -- Pharyngeal -- Pharyngeal- Regular -- Pharyngeal -- Pharyngeal- Multi-consistency --  Pharyngeal -- Pharyngeal- Pill -- Pharyngeal -- Pharyngeal Comment --  CHL IP CERVICAL ESOPHAGEAL PHASE 12/20/2016 Cervical Esophageal Phase WFL Pudding Teaspoon -- Pudding Cup -- Honey Teaspoon -- Honey Cup -- Nectar Teaspoon -- Nectar Cup -- Nectar Straw -- Thin Teaspoon -- Thin Cup -- Thin Straw -- Puree -- Mechanical Soft -- Regular -- Multi-consistency -- Pill -- Cervical Esophageal Comment -- No flowsheet data found. Germain Osgood 12/20/2016, 9:44 AM  Germain Osgood, M.A. CCC-SLP [phone]              Time Spent in minutes  25   Louellen Molder M.D on 12/29/2016 at 12:54 PM  Between 7am to 7pm - Pager - [phone]  After 7pm go to [email or web address] - password [facility]  [facility] -  Office  [phone]

## 2016-12-29 NOTE — Progress Notes (Signed)
Inpatient Rehabilitation  Met with mother-in-law to discuss discharge plan and available caregiver support.  Patient does not have a partner/caregiver to assist with parataenial dialysis nor does she have 24/7 care.  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  Mother-in-law and sister-in-law are overwhelmed, struggling to care for her father, and cannot care for her.  They are requesting patient consider SNF placement.    Vida Roller, RM CM and myself spoke with patient to notify of a need for designated caregiver for IP Rehab bed offer.  Patient is also touching base with her work to determine a plan/timeline for her insurance coverage.  Plan to meet as a team tomorrow morning to continue discussion.  Please call with questions.   Carmelia Roller., CCC/SLP Admission Coordinator  [place]  Cell [phone]

## 2016-12-29 NOTE — Progress Notes (Signed)
Inpatient Rehabilitation  Met with patient and mother-in-law to discuss team's recommendation for IP Rehab.  Mother-in-law stated that she alone can not provide 24/7 assist; however, she will have a family meeting to discuss this with other members. I have initiating insurance authorization for IP Rehab.  Plan to follow for timing of medical readiness, insurance authorization, and bed availability.  Please call with questions.   Carmelia Roller., CCC/SLP Admission Coordinator  [place]  Cell [phone]

## 2016-12-29 NOTE — Progress Notes (Signed)
Physical Therapy Treatment Patient Details Name: Terry Juarez MRN: [id number] DOB: 05/05/1966 Today's Date: 12/29/2016    History of Present Illness Pt is a 51 y/o female who presents s/p witnessed seizure at home with new L-sided weakness. No further seizure activity noted since admission. Imaging revealed R frontal lobe hemorrhage. Coded in hemodiaysis    PT Comments    Pt participated hard in therapy today.  Continues to be densely plegic on the L side.  Emphasis on sitting then standing balance/tolerance and transitions to EOB and to standing.  Pt still needing maximal to total assist.   Follow Up Recommendations  CIR;Supervision/Assistance - 24 hour     Equipment Recommendations  Wheelchair (measurements PT);Wheelchair cushion (measurements PT)    Recommendations for Other Services Rehab consult     Precautions / Restrictions Precautions Precautions: Fall    Mobility  Bed Mobility Overal bed mobility: Needs Assistance Bed Mobility: Rolling;Sidelying to Sit Rolling: Min assist Sidelying to sit: Max assist       General bed mobility comments: Rolling L, pt able to bring R LE and R shoulder aver to attain sidelying with minimal truncal assist  Transfers Overall transfer level: Needs assistance Equipment used: 2 person hand held assist Transfers: Sit to/from [facility] Sit to Stand: Mod assist;+2 physical assistance Stand pivot transfers: Total assist;+2 physical assistance       General transfer comment: pt facilitated to come forward and then up to fully erect posture, support at L knee, hips and sternum.  With transfer, L knee would buckle unless stabilized and pt needed w/shift help and stability to sidestepp with R LE.  Ambulation/Gait             General Gait Details: not ready   Stairs            Wheelchair Mobility    Modified Rankin (Stroke Patients Only) Modified Rankin (Stroke Patients Only) Pre-Morbid Rankin Score:  No symptoms Modified Rankin: Severe disability     Balance Overall balance assessment: Needs assistance Sitting-balance support: Feet supported;Feet unsupported;Single extremity supported Sitting balance-Leahy Scale: Poor Sitting balance - Comments: sat EOB  x10 min working up balance, upright posture, w/shifting, scooting. Postural control: Left lateral lean;Posterior lean Standing balance support: Bilateral upper extremity supported Standing balance-Leahy Scale: Zero Standing balance comment: stood x2 working on balance, w/shifting, and upright posture                            Cognition Arousal/Alertness: Awake/alert Behavior During Therapy: Flat affect                           Following Commands: Follows one step commands consistently;Follows one step commands with increased time Safety/Judgement: Decreased awareness of safety;Decreased awareness of deficits   Problem Solving: Slow processing;Decreased initiation;Difficulty sequencing;Requires verbal cues;Requires tactile cues        Exercises Other Exercises Other Exercises: bicep/tricep presses x10 R UE, PROM and check for movement L EU Other Exercises: R LE resisted gross flexion and extension x10 and L LE PROM/ attempting to elicit movement    General Comments General comments (skin integrity, edema, etc.): Mother in law present again      Pertinent Vitals/Pain Pain Assessment: Faces Faces Pain Scale: Hurts little more Pain Location: headache Pain Descriptors / Indicators: Aching;Grimacing Pain Intervention(s): Monitored during session;Heat applied    Home Living  Prior Function            PT Goals (current goals can now be found in the care plan section) Acute Rehab PT Goals Patient Stated Goal: Pt did not state goals. PT Goal Formulation: Patient unable to participate in goal setting Time For Goal Achievement: 01/04/17 Potential to Achieve Goals:  Good Progress towards PT goals: Progressing toward goals    Frequency    Min 4X/week      PT Plan Current plan remains appropriate    Co-evaluation              AM-PAC PT "6 Clicks" Daily Activity  Outcome Measure  Difficulty turning over in bed (including adjusting bedclothes, sheets and blankets)?: Total Difficulty moving from lying on back to sitting on the side of the bed? : Total Difficulty sitting down on and standing up from a chair with arms (e.g., wheelchair, bedside commode, etc,.)?: Total Help needed moving to and from a bed to chair (including a wheelchair)?: Total Help needed walking in hospital room?: Total Help needed climbing 3-5 steps with a railing? : Total 6 Click Score: 6    End of Session Equipment Utilized During Treatment: Gait belt Activity Tolerance: Patient tolerated treatment well Patient left: in chair;with call bell/phone within reach;with chair alarm set;with family/visitor present Nurse Communication: Mobility status;Need for lift equipment PT Visit Diagnosis: Hemiplegia and hemiparesis Hemiplegia - Right/Left: Left Hemiplegia - dominant/non-dominant: Non-dominant Hemiplegia - caused by: Nontraumatic intracerebral hemorrhage     Time: 1513-1540 PT Time Calculation (min) (ACUTE ONLY): 27 min  Charges:  $Therapeutic Activity: 23-37 mins                    G Codes:       01/22/2017  Donnella Sham, PT [phone]  (pager)   Tessie Fass Briar Sword January 22, 2017, 4:09 PM

## 2016-12-29 NOTE — Progress Notes (Signed)
Subjective:   completed PD overnight no issues, BP's stable.  1.3 L net UF negative.   Objective Vital signs in last 24 hours: Vitals:   12/29/16 0815 12/29/16 0839 12/29/16 0944 12/29/16 1019  BP: (!) 159/88 (!) 152/89 (!) 155/89   Pulse: 85 83 75   Resp: 16  18   Temp: 98 F (36.7 C)   98.6 F (37 C)  TempSrc: Oral   Oral  SpO2:   94%   Weight: 55.6 kg (122 lb 9.2 oz)     Height:       Weight change: -1.8 kg (-3 lb 15.5 oz)  Intake/Output Summary (Last 24 hours) at 12/29/16 1309 Last data filed at 12/29/16 0815  Gross per 24 hour  Intake            10066 ml  Output            11055 ml  Net             -989 ml    Summary: Pt is a 51 y.o. yo female with ESRD who was admitted on 12/19/2016 with sz/hemiparesis- found to have right sided frontal lobe hemorrhage   Assess/Plan: 1. Intracranial CVA/ hemorrhage- with L hemiparesis. Did not require surgery. PRES was also present- was making some clinical recovery but then had "mini-code" at dialysis on 6/22 and spent time in ICU, now on SDU. Hemodialysis was stopped due to recurrent labile BP issues and now is back on PD 2. ESRD - normally on PD as OP. Cont PD for now.  3. Anemia- history of GIB and low hgb- has responded remarkable well to ESA and that is part of reason for hypertension. Hb 15-17 range.   4. MBD of CKD- resumed phoslo, calcitriol and sensipar  5. HTN/volume- on 5 bp meds now (was on 3 at home).  Vol /wt's are up a bit at 56kg.  Will continue half 1.5 % and half 2.5% with PD for now.    [name] [employer] pgr [phone]   12/29/2016, 1:09 PM        Labs: Basic Metabolic Panel:  Recent Labs Lab 12/25/16 0245 12/26/16 0443 12/27/16 0248 12/28/16 0317 12/29/16 0532  NA 134* 137 137 136 137  K 4.8 4.4 4.7 4.4 3.8  CL 99* 99* 99* 100* 99*  CO2 20* 21* 22 22 24   GLUCOSE 122* 113* 116* 113* 124*  BUN 65* 59* 60* 54* 52*  CREATININE 10.94* 11.08* 11.78* 10.86* 10.91*  CALCIUM  8.8* 8.9 9.1 9.5 9.5  PHOS 7.5* 10.1* 9.5*  --   --    Liver Function Tests:  Recent Labs Lab 12/27/16 0248  ALBUMIN 2.6*   No results for input(s): LIPASE, AMYLASE in the last 168 hours. No results for input(s): AMMONIA in the last 168 hours. CBC:  Recent Labs Lab 12/23/16 0253 12/24/16 0508 12/24/16 1238 12/25/16 0245 12/26/16 0443  WBC 7.6 7.9 9.9 10.6* 10.3  HGB 17.4* 17.7* 18.0* 18.7* 17.6*  HCT 53.4* 55.0* 55.3* 55.9* 55.7*  MCV 101.1* 100.9* 100.2* 98.9 102.6*  PLT 247 278 253 159 218   Cardiac Enzymes: No results for input(s): CKTOTAL, CKMB, CKMBINDEX, TROPONINI in the last 168 hours. CBG:  Recent Labs Lab 12/24/16 1108  GLUCAP 148*    Iron Studies: No results for input(s): IRON, TIBC, TRANSFERRIN, FERRITIN in the last 72 hours. Studies/Results: No results found. Medications: Infusions: . sodium chloride Stopped (12/26/16 1730)  . dialysis solution 1.5% low-MG/low-CA    .  dialysis solution 2.5% low-MG/low-CA    . dialysis solution 2.5% low-MG/low-CA    . levETIRAcetam 500 mg (12/29/16 0945)    Scheduled Medications: . amLODipine  10 mg Oral Daily  . calcitRIOL  0.25 mcg Oral Q M,W,F-1800  . calcium acetate  1,334 mg Oral TID WC  . carvedilol  12.5 mg Oral BID WC  . cinacalcet  60 mg Oral Q supper  . cloNIDine  0.1 mg Oral TID  . famotidine  20 mg Oral Daily  . gentamicin cream  1 application Topical Daily  . hydrALAZINE  25 mg Oral Q8H  . levothyroxine  88 mcg Oral QAC breakfast  . losartan  100 mg Oral QHS  . mouth rinse  15 mL Mouth Rinse BID  . multivitamin  1 tablet Oral Daily  . pantoprazole  40 mg Oral BID  . polyethylene glycol  17 g Oral Daily  . senna-docusate  1 tablet Oral BID    have reviewed scheduled and prn medications.  Physical Exam: General: much more alert "hey doc" Heart: RRR Lungs: mostly clear Abdomen: soft, PD cath in place Extremities: minimal edema Dialysis Access: PD cath and right upper ARM AVG      12/29/2016,1:09 PM  LOS: 10 days

## 2016-12-29 NOTE — Care Management Note (Signed)
Case Management Note  Patient Details  Name: Terry Juarez MRN: [id number] Date of Birth: 12-27-1965  Subjective/Objective:                    Action/Plan: PT/OT recommendations are for CIR. CM following for d/c disposition.   Expected Discharge Date:                  Expected Discharge Plan:  IP Rehab Facility  In-House Referral:  Clinical Social Work  Discharge planning Services  CM Consult  Post Acute Care Choice:    Choice offered to:     DME Arranged:    DME Agency:     HH Arranged:    [place] Agency:     Status of Service:  In process, will continue to follow  If discussed at Long Length of Stay Meetings, dates discussed:    Additional Comments:  Pollie Friar, RN 12/29/2016, 11:29 AM

## 2016-12-29 NOTE — Progress Notes (Signed)
CSW met with patient and patient's mother-in-law to discuss possible SNF placement as back-up to CIR admission. Patient indicated that she did not want to go to SNF at discharge. CSW explained that if patient was refusing SNF placement, and if CIR is not able to admit the patient, then the patient will go home when medically ready. Patient's mother-in-law expressed concern about what care the patient would be able to receive if she went home at discharge instead of CIR or SNF. CSW explained that patient is allowed to make her own decisions. Patient's mother-in-law indicated she would talk about options with the patient.   CSW signing off for now; please consult if needed again in the future for SNF placement.  Laveda Abbe, [place] Clinical Social Worker [phone]

## 2016-12-30 LAB — BASIC METABOLIC PANEL
Anion gap: 12 (ref 5–15)
BUN: 53 mg/dL — AB (ref 6–20)
CHLORIDE: 98 mmol/L — AB (ref 101–111)
CO2: 24 mmol/L (ref 22–32)
Calcium: 9.7 mg/dL (ref 8.9–10.3)
Creatinine, Ser: 10.92 mg/dL — ABNORMAL HIGH (ref 0.44–1.00)
GFR calc Af Amer: 4 mL/min — ABNORMAL LOW (ref >60)
GFR calc non Af Amer: 4 mL/min — ABNORMAL LOW (ref >60)
GLUCOSE: 114 mg/dL — AB (ref 65–99)
POTASSIUM: 4.5 mmol/L (ref 3.5–5.1)
Sodium: 134 mmol/L — ABNORMAL LOW (ref 135–145)

## 2016-12-30 LAB — MAGNESIUM: Magnesium: 1.9 mg/dL (ref 1.7–2.4)

## 2016-12-30 MED ORDER — DELFLEX-LC/2.5% DEXTROSE 394 MOSM/L IP SOLN: INTRAPERITONEAL | Status: DC

## 2016-12-30 MED FILL — Medication: Qty: 1 | Status: AC

## 2016-12-30 NOTE — Progress Notes (Signed)
  Speech Language Pathology Treatment: Dysphagia;Cognitive-Linquistic  Patient Details Name: Terry Juarez MRN: [id number] DOB: June 12, 1966 Today's Date: 12/30/2016 Time: 7591-6384 SLP Time Calculation (min) (ACUTE ONLY): 16 min  Assessment / Plan / Recommendation Clinical Impression  Dysphagia treatment provided for diet tolerance check. Pt observed with lunch tray; consumed several bites of dysphagia 3 consistency without overt s/s of aspiration. Pt was observed to have some anterior loss of food but this was likely due to feeding difficulty rather than an oral containment issue (spaghetti using right hand only). This diet appears to continue to be appropriate. Recommend continuing dysphagia 3/ thin liquids, meds whole with liquid, intermittent supervision to ensure pt seated upright, assist with feeding if needed. Pt oriented x4 at bedside today. Continues to present with flat affect and decreased awareness of deficits/ recall of new information and benefited from verbal/ environmental cues. Speech was 100% intelligible. Will continue to follow for diet tolerance/ consider advancement, along with cognitive skills.   HPI HPI: 51 yo F with history of ESRD on Peritoneal Dialysis, HTN, Right renal mass, and Anemia, who was found down on the floor seizing. She was found to have a large right frontal hemorrhage with surrounding edema and mass effect, also with small SAH and signs of PRES. Pt had MBS 6/17 recommending regular diet and thin liquids. Patient's status declined requiring intubation on 6/21-6/22 and so SLP ordered to re-assess swallow function s/p this intubation.      SLP Plan  Continue with current plan of care       Recommendations  Diet recommendations: Dysphagia 3 (mechanical soft);Thin liquid Liquids provided via: Cup;Straw Medication Administration: Whole meds with liquid Supervision: Patient able to self feed;Intermittent supervision to cue for compensatory  strategies Compensations: Slow rate;Small sips/bites Postural Changes and/or Swallow Maneuvers: Seated upright 90 degrees                Follow up Recommendations: Inpatient Rehab SLP Visit Diagnosis: Dysphagia, unspecified (R13.10);Cognitive communication deficit (Y65.993) Plan: Continue with current plan of care       [place], [place], [place] 12/30/2016, 12:02 PM [phone]

## 2016-12-30 NOTE — Progress Notes (Signed)
Progress Note  BID  Pt fatigued from time up, but worked hard on participating.  Worked on scooting and upright sitting, squat pivot transfer and assisting with sit to sidelying.     12/30/16 1658  PT Visit Information  Last PT Received On 12/30/16  Assistance Needed +2  History of Present Illness Pt is a 51 y/o female who presents s/p witnessed seizure at home with new L-sided weakness. No further seizure activity noted since admission. Imaging revealed R frontal lobe hemorrhage. Coded in hemodiaysis  Subjective Data  Patient Stated Goal Pt did not state goals.  Precautions  Precautions Fall  Precaution Comments Peritoneal dialysis patient  Pain Assessment  Pain Assessment No/denies pain  Cognition  Arousal/Alertness Awake/alert  Behavior During Therapy Flat affect  Overall Cognitive Status Within Functional Limits for tasks assessed  Bed Mobility  Overal bed mobility Needs Assistance  Bed Mobility Sit to Sidelying;Rolling  Rolling Min assist  Sit to sidelying Max assist;+2 for physical assistance  General bed mobility comments cues and assist for direction, pt assisting through out.  Transfers  Overall transfer level Needs assistance  Equipment used 2 person hand held assist;Rolling walker (2 wheeled)  Transfers Squat Pivot Transfers  Squat pivot transfers Max assist;+2 safety/equipment  General transfer comment pt assisted scoot to Edge of chair, assisted with stand and pivoted without steps.  Ambulation/Gait  General Gait Details not ready  Modified Rankin (Stroke Patients Only)  Pre-Morbid Rankin Score 0  Modified Rankin 5  Balance  Sitting balance-Leahy Scale Poor  Standing balance-Leahy Scale Zero  PT - End of Session  Activity Tolerance Patient tolerated treatment well  Patient left in bed;with call bell/phone within reach;with bed alarm set  Nurse Communication Mobility status  PT - Assessment/Plan  PT Plan Current plan remains appropriate  PT Visit Diagnosis  Hemiplegia and hemiparesis  Hemiplegia - Right/Left Left  Hemiplegia - dominant/non-dominant Non-dominant  Hemiplegia - caused by Nontraumatic intracerebral hemorrhage  PT Frequency (ACUTE ONLY) Min 4X/week  Recommendations for Other Services Rehab consult  Follow Up Recommendations CIR;Supervision/Assistance - 24 hour  PT equipment Wheelchair (measurements PT);Wheelchair cushion (measurements PT)  AM-PAC PT "6 Clicks" Daily Activity Outcome Measure  Difficulty turning over in bed (including adjusting bedclothes, sheets and blankets)? 1  Difficulty moving from lying on back to sitting on the side of the bed?  1  Difficulty sitting down on and standing up from a chair with arms (e.g., wheelchair, bedside commode, etc,.)? 1  Help needed moving to and from a bed to chair (including a wheelchair)? 1  Help needed walking in hospital room? 1  Help needed climbing 3-5 steps with a railing?  1  6 Click Score 6  Mobility G Code  CN  PT Goal Progression  Progress towards PT goals Progressing toward goals (but very slow progress)  Acute Rehab PT Goals  Time For Goal Achievement 01/04/17  Potential to Achieve Goals Fair  PT Time Calculation  PT Start Time (ACUTE ONLY) 1605  PT Stop Time (ACUTE ONLY) 1617  PT Time Calculation (min) (ACUTE ONLY) 12 min  PT General Charges  $$ ACUTE PT VISIT 1 Procedure  PT Treatments  $Therapeutic Activity 8-22 mins   12/30/2016  Donnella Sham, PT [phone]  (pager)

## 2016-12-30 NOTE — Progress Notes (Signed)
PROGRESS NOTE                                                                                                                                                                                                             Patient Demographics:    Terry Juarez, is a 51 y.o. female, DOB - 01/03/1966, LJQ:[id number]  Admit date - 12/19/2016   Admitting Physician Reyne Dumas, MD  Outpatient Primary MD for the patient is Tisovec, Fransico Him, MD  LOS - 11    Chief Complaint  Patient presents with  . Seizures       Brief Narrative   51 year old female with ESRD on peritoneal dialysis, renal cell carcinoma status post right nephrectomy, hypertension, hyperlipidemia and anemia was found down on the floor actively seizing by her roommate. She was brought to the ED with symptoms of right frontal headache and acute left-sided weakness. Head CT showed large 29 mm right frontal hemorrhage with surrounding edema and mass effect along with small subarachnoid hemorrhages and signs of PRES. CT angiogram head and neck showed no AVMs but with increase in size of the hematoma. Patient to my neurology in the ED and started on nicardipine and  Keppra. MRI of the brain showed no change in the size of right frontal hematoma and also conformed PRES.  Last head CT on 6/21 showed stable right frontal hemorrhage with evolving surrounding edema and stable 3-4 mm midline shift.  Patient transferred to hospitalist service on 6/24.   Subjective:   Had BM with enema yesterday. denies any pain   Assessment  & Plan :    Active Problems:   Intracranial hemorrhage (HCC) Right posterior frontal lobe acute hemorrhage with surrounding edema and local mass effect.  CT and MRI findings suggestive of PRES in the setting of hypertension. Also has subarachnoid hemorrhage. No vascular explanation for right frontoparietal hematoma. Has  residual left  hemiparesis. 2-D echo without cardiac source of emboli. EEG shows diffuse slowing. No antithrombotic or anticoagulation due to hemorrhage. Appreciate neurology's recommendation. Aggressive stroke risk factor management. Patient does not qualify for CIR as she does not have enough care at home upon discharge. (lives with her father who is wheelchair bound) Follow-up with Dr Erlinda Hong in 6 weeks.  Acute seizures Likely triggered by intracranial hemorrhage. Continue empiric Keppra. EEG shows diffuse slowing.  PRES (posterior reversible encephalopathy syndrome) with hypertensive emergency SBP goal less than 160. Currently on 5 different blood pressure medications well controlled. Titrate as tolerated.  Cardiac arrest on 6/22 Short cardiac arrest of 30 seconds while at hemodialysis on 6/22. Plan on no further attempt to hemodialysis due to labile blood pressure.  Acute respiratory failure with hypoxia Multifactorial secondary to hypertensive emergency, seizures and ICH Currently stable on room air.  ESRD on PD Not tolerating hemodialysis. Renal consult appreciated. Resumed Sensipar, calcitriol and PhosLo.  History of renal cell carcinoma status post right nephrectomy  Polycythemia Etiology unclear. Monitor for now. Prior history of GI bleed with anemia, has responded well to ESA.   Constipation Resolved with enema. Order daily MiraLAX.    Code Status : Full code  Family Communication  : None at bedside  Disposition Plan  :  Patient noted his blood for CIR due to lack of 24/7 caregiver support at home. I'm informed that no SNF will take her with peritoneal dialysis. Patient has significant debility to home and does not have enough care at home. Plan to refer to LTAC.  Barriers For Discharge : Pending placement  Consults  :   Neurology Nephrology CIR   Procedures  :  Hemodialysis CT head CT angiogram head and neck MRI brain  DVT Prophylaxis  : SCDs  Lab Results    Component Value Date   PLT 218 12/26/2016    Antibiotics  :   Anti-infectives    None        Objective:   Vitals:   12/30/16 0117 12/30/16 0425 12/30/16 0700 12/30/16 0939  BP: 128/76 (!) 150/77  (!) 155/90  Pulse: 65 70 (!) 0 78  Resp: 18 20  18   Temp: 98 F (36.7 C) 97.5 F (36.4 C)  97.9 F (36.6 C)  TempSrc: Oral Axillary  Oral  SpO2: 97% 95%  92%  Weight:      Height:        Wt Readings from Last 3 Encounters:  12/29/16 55.6 kg (122 lb 9.2 oz)  09/04/16 54.4 kg (120 lb)  08/02/16 56.9 kg (125 lb 7.1 oz)     Intake/Output Summary (Last 24 hours) at 12/30/16 1425 Last data filed at 12/30/16 0810  Gross per 24 hour  Intake            10745 ml  Output            10909 ml  Net             -164 ml     Physical Exam Gen.: Not in distress, fatigued HEENT: Moist mucosa, supple neck Chest: Clear to auscultation bilaterally CVS: Normal S1 and S2, no murmurs GI: Soft, nondistended, nontender, peritoneal dialysis catheter + Musculoskeletal: Warm, no edema  CNS: Oriented, left hemiparesis,     Data Review:    CBC  Recent Labs Lab 12/24/16 0508 12/24/16 1238 12/25/16 0245 12/26/16 0443  WBC 7.9 9.9 10.6* 10.3  HGB 17.7* 18.0* 18.7* 17.6*  HCT 55.0* 55.3* 55.9* 55.7*  PLT 278 253 159 218  MCV 100.9* 100.2* 98.9 102.6*  MCH 32.5 32.6 33.1 32.4  MCHC 32.2 32.5 33.5 31.6  RDW 15.5 15.3 15.4 15.8*    Chemistries   Recent Labs Lab 12/25/16 0245 12/26/16 0443 12/27/16 0248 12/28/16 0317 12/29/16 0532 12/30/16 0525  NA 134* 137 137 136 137 134*  K 4.8 4.4 4.7 4.4 3.8 4.5  CL 99* 99* 99* 100* 99* 98*  CO2  20* 21* 22 22 24 24   GLUCOSE 122* 113* 116* 113* 124* 114*  BUN 65* 59* 60* 54* 52* 53*  CREATININE 10.94* 11.08* 11.78* 10.86* 10.91* 10.92*  CALCIUM 8.8* 8.9 9.1 9.5 9.5 9.7  MG 2.4 2.2  --  2.0 1.9 1.9   ------------------------------------------------------------------------------------------------------------------  Recent Labs   12/28/16 0317  TRIG 141    No results found for: HGBA1C ------------------------------------------------------------------------------------------------------------------ No results for input(s): TSH, T4TOTAL, T3FREE, THYROIDAB in the last 72 hours.  Invalid input(s): FREET3 ------------------------------------------------------------------------------------------------------------------ No results for input(s): VITAMINB12, FOLATE, FERRITIN, TIBC, IRON, RETICCTPCT in the last 72 hours.  Coagulation profile No results for input(s): INR, PROTIME in the last 168 hours.  No results for input(s): DDIMER in the last 72 hours.  Cardiac Enzymes No results for input(s): CKMB, TROPONINI, MYOGLOBIN in the last 168 hours.  Invalid input(s): CK ------------------------------------------------------------------------------------------------------------------ No results found for: BNP  Inpatient Medications  Scheduled Meds: . amLODipine  10 mg Oral Daily  . calcitRIOL  0.25 mcg Oral Q M,W,F-1800  . calcium acetate  1,334 mg Oral TID WC  . carvedilol  12.5 mg Oral BID WC  . cinacalcet  60 mg Oral Q supper  . cloNIDine  0.1 mg Oral TID  . famotidine  20 mg Oral Daily  . gentamicin cream  1 application Topical Daily  . hydrALAZINE  25 mg Oral Q8H  . levothyroxine  88 mcg Oral QAC breakfast  . losartan  100 mg Oral QHS  . mouth rinse  15 mL Mouth Rinse BID  . multivitamin  1 tablet Oral Daily  . pantoprazole  40 mg Oral BID  . polyethylene glycol  17 g Oral Daily  . senna-docusate  1 tablet Oral BID   Continuous Infusions: . sodium chloride Stopped (12/26/16 1730)  . dialysis solution 2.5% low-MG/low-CA    . levETIRAcetam Stopped (12/30/16 0931)   PRN Meds:.sodium chloride, acetaminophen, dianeal solution for CAPD/CCPD with heparin, dianeal solution for CAPD/CCPD with heparin, hydrALAZINE, labetalol  Micro Results No results found for this or any previous visit (from the past 240  hour(s)).  Radiology Reports Ct Angio Head W Or Wo Contrast  Result Date: 12/19/2016 CLINICAL DATA:  Intraparenchymal and subarachnoid hemorrhage. EXAM: CT ANGIOGRAPHY HEAD AND NECK TECHNIQUE: Multidetector CT imaging of the head and neck was performed using the standard protocol during bolus administration of intravenous contrast. Multiplanar CT image reconstructions and MIPs were obtained to evaluate the vascular anatomy. Carotid stenosis measurements (when applicable) are obtained utilizing NASCET criteria, using the distal internal carotid diameter as the denominator. CONTRAST:  50 cc Isovue 370 intravenous COMPARISON:  Noncontrast head CT from earlier today FINDINGS: CTA NECK FINDINGS Aortic arch: Atheromatous wall thickening that is mild. No acute finding or aneurysm. Right carotid system: Mild atheromatous plaque at the common carotid bifurcation without stenosis or ulceration. Beaded ICA consistent with fibromuscular dysplasia. No superimposed acute dissection. Left carotid system: Scattered atheromatous plaque on the distal common carotid and proximal ICA, moderate for age. No flow limiting stenosis or ulceration. Beading of the left ICA consistent with fibromuscular dysplasia. No superimposed dissection. Vertebral arteries: Proximal subclavian atherosclerosis without flow limiting stenosis. Both vertebral arteries are smooth and widely patent to the dura. Left vertebral dominance. Skeleton: No acute or aggressive finding. Other neck: Negative for mass or inflammation. Upper chest: Negative Review of the MIP images confirms the above findings CTA HEAD FINDINGS Anterior circulation: Moderate calcified plaque on the carotid siphons without flow limiting stenosis. Standard branching. There is no evidence  of aneurysm or vascular malformation. No major branch occlusion or proximal flow limiting stenosis. Posterior circulation: Left dominant vertebral artery. The vertebral and basilar arteries are smooth and  widely patent. Aplastic right and mildly hypoplastic left P1 segments with large posterior communicating arteries. No branch occlusion, stenosis, aneurysm, or evidence of vascular malformation. Venous sinuses: Patent on the delayed phase. Anatomic variants: Fetal type PCA circulation, especially on the right. Delayed phase: No masslike enhancement. Pending brain MRI. The right frontal parietal hematoma is larger than before, measuring 27 x 34 x 35 mm as compared to 20 x 31 x 28 mm. Local subarachnoid hemorrhage and mild cerebral edema without shift or herniation. Review of the MIP images confirms the above findings IMPRESSION: 1. No vascular explanation for the right frontal parietal hematoma. Dural venous sinuses are patent and there is no evidence of vascular malformation. 2. The 16 cc hematoma is mildly increased from earlier CT. Negative for shift or herniation. 3. Fibromuscular dysplasia of the bilateral cervical ICA. 4. Moderate atherosclerosis for age. Negative for flow limiting stenosis. Electronically Signed   By: Monte Fantasia M.D.   On: 12/19/2016 19:37   Ct Head Wo Contrast  Result Date: 12/24/2016 CLINICAL DATA:  Intraparenchymal hemorrhage. EXAM: CT HEAD WITHOUT CONTRAST TECHNIQUE: Contiguous axial images were obtained from the base of the skull through the vertex without intravenous contrast. COMPARISON:  Multiple prior head CTs, most recently 12/22/2016 FINDINGS: Brain: The right frontal hemorrhage is stable in size. Evolving surrounding edema is noted. 3-4 mm of midline shift is not significantly changed. No new hemorrhage is present. The subarachnoid hemorrhage is not significantly changed. Vascular: Atherosclerotic calcifications are again noted. Skull: Calvarium is intact. Sinuses/Orbits: The paranasal sinuses and mastoid air cells are clear. IMPRESSION: 1. Stable right frontal hemorrhage with evolving surrounding edema. 2. Stable 3-4 mm of midline shift. Electronically Signed   By:  San Morelle M.D.   On: 12/24/2016 12:22   Ct Head Wo Contrast  Result Date: 12/22/2016 CLINICAL DATA:  Patient admitted for Franquez, stroke. MRI demonstrated PRES. EXAM: CT HEAD WITHOUT CONTRAST TECHNIQUE: Contiguous axial images were obtained from the base of the skull through the vertex without intravenous contrast. COMPARISON:  Multiple priors, most recent earlier today at 3:55 a.m. FINDINGS: Brain: Redemonstrated is an intraparenchymal hemorrhage, not significantly changed in size or shape, roughly 4 x 3 x 5 cm. Mild surrounding vasogenic edema is not increased. Slight effacement RIGHT lateral ventricle. Unchanged midline shift RIGHT to LEFT, roughly 3 mm. No hydrocephalus or ventricular trapping. Effacement of the basilar cisterns redemonstrated. Vascular: No hyperdense vessel or unexpected calcification. Skull: Normal. Negative for fracture or focal lesion. Sinuses/Orbits: No acute finding. Other: None. IMPRESSION: Stable intraparenchymal hemorrhage. Subtle signs of midline shift, and slight increased pressure. But no significant worsening from priors. Electronically Signed   By: Staci Righter M.D.   On: 12/22/2016 09:47   Ct Head Wo Contrast  Result Date: 12/22/2016 CLINICAL DATA:  Follow-up examination for acute intracranial hemorrhage. EXAM: CT HEAD WITHOUT CONTRAST TECHNIQUE: Contiguous axial images were obtained from the base of the skull through the vertex without intravenous contrast. COMPARISON:  Prior CT from 12/21/2016. FINDINGS: Brain: Intraparenchymal hemorrhage centered at the high right frontal parietal region again seen. Hemorrhage overall not significantly changed measuring 4.1 x 3.3 x 5.4 cm seen greatest dimensions (previously 4.1 x 3.4 x 5.5 cm). Surrounding low-density vasogenic edema relatively with sulcal effacement relatively similar. Mass effect on the right lateral ventricle which is partially effaced relatively unchanged.  Right-to-left midline shift measures approximately  3 mm, similar. No hydrocephalus or ventricular trapping. No intraventricular extension of hemorrhage. Adjacent small volume subarachnoid hemorrhage noted, similar. Adjacent extra-axial hemorrhage unchanged. No new acute intracranial hemorrhage. No evidence for acute large vessel territory infarct. Vascular: No hyperdense vessel. Scattered vascular calcifications noted within the carotid siphons. Skull: Scalp soft tissues and calvarium unchanged, and remain within normal limits. Sinuses/Orbits: Globes and orbital soft tissues within normal limits. Paranasal sinuses and mastoid air cells remain clear. IMPRESSION: 1. No significant interval change in posterior right frontal intraparenchymal hematoma with extra-axial extension. Similar localized edema and mass effect with 3 mm right-to-left shift. 2. No other new acute intracranial process. Electronically Signed   By: Jeannine Boga M.D.   On: 12/22/2016 04:49   Ct Head Wo Contrast  Result Date: 12/21/2016 CLINICAL DATA:  Intracranial hemorrhage. EXAM: CT HEAD WITHOUT CONTRAST TECHNIQUE: Contiguous axial images were obtained from the base of the skull through the vertex without intravenous contrast. COMPARISON:  CT head without contrast 12/20/2016 and 12/19/2016. FINDINGS: Brain: The focal hyperdense hemorrhage within the high right frontal lobe extends to the cortex. The area of hemorrhage continues to slowly increase in size. Maximal dimensions on coronal and sagittal reformats are now 4.1 x 3.4 by 5.5 cm. This compares with previous measurements of 3.8 x 8 3.3 x 5.2 cm and is significantly larger than the regional measurements. Adjacent extra-axial hemorrhage is similar the prior study. Mild edema surrounds the hemorrhage. Local mass effect is present with effacement of the sulci. Minimal midline shift is present. There is increasing effacement of the right lateral ventricle. Vascular: Atherosclerotic calcifications are present within the cavernous internal  carotid arteries. There is no hyperdense vessel. Skull: The calvarium is intact. No focal lytic or blastic lesions are present. Sinuses/Orbits: The paranasal sinuses and mastoid air cells are clear. The globes and orbits are unremarkable. IMPRESSION: 1. Continued increase in size of right frontal parenchymal hemorrhage with extra-axial extension. 2. Slight increase in associated mass effect. These results were called by telephone at the time of interpretation on 12/21/2016 at 9:21 am to Dr. Rosalin Hawking , who verbally acknowledged these results. Electronically Signed   By: San Morelle M.D.   On: 12/21/2016 09:29   Ct Head Wo Contrast  Result Date: 12/20/2016 CLINICAL DATA:  Intracranial hemorrhage follow up EXAM: CT HEAD WITHOUT CONTRAST TECHNIQUE: Contiguous axial images were obtained from the base of the skull through the vertex without intravenous contrast. COMPARISON:  Brain MRI 12/19/2016 Head CT 12/19/2016 Head CTA 12/19/2016 FINDINGS: Brain: Intraparenchymal hematoma centered in the right frontal lobe now measures 3.8 x 3.4 x 3.8 cm, previously 3.7 x 3.8 by 3.0 cm. Surrounding edema is unchanged. There is subarachnoid blood over the right convexity, also unchanged. There is no midline shift. No hydrocephalus. Basal cisterns remain patent. Vascular: No hyperdense vessel or unexpected calcification. Skull: Normal visualized skull base, calvarium and extracranial soft tissues. Sinuses/Orbits: No sinus fluid levels or advanced mucosal thickening. No mastoid effusion. Normal orbits. IMPRESSION: 1. Slightly increased size of right frontal intraparenchymal hematoma with unchanged right convexity subarachnoid blood, compared to head CTA performed 12/19/2016. Compared to the initial head CT from this presentation, obtained at 5:02 p.m. on 12/19/2016, the hematoma has increased in size. 2. No new hemorrhage, hydrocephalus or mass effect. Electronically Signed   By: Ulyses Jarred M.D.   On: 12/20/2016 06:29    Ct Head Wo Contrast  Result Date: 12/19/2016 CLINICAL DATA:  51 y/o  F; seizure.  EXAM: CT HEAD WITHOUT CONTRAST CT CERVICAL SPINE WITHOUT CONTRAST TECHNIQUE: Multidetector CT imaging of the head and cervical spine was performed following the standard protocol without intravenous contrast. Multiplanar CT image reconstructions of the cervical spine were also generated. COMPARISON:  02/11/2015 CT of the head. FINDINGS: CT HEAD FINDINGS Brain: Right posterior frontal lobe acute hemorrhage measuring 22 x 29 x 21 mm (AP x ML x CC series 3 image 26 and series 5, image 34). Small volume of subarachnoid hemorrhage with overlying the right frontal convexity and along the falx. There is a small surrounding region of hypoattenuation in the brain compatible with edema with local mass effect effacing sulci. No significant midline shift. No evidence for herniation. No large territory acute stroke identified. There are foci of hypoattenuation within subcortical white matter in the bilateral parietal lobes and occipital lobes. The distribution is suggestive of PRES. Vascular: Extensive calcific atherosclerosis of the carotid siphons. Skull: Normal. Negative for fracture or focal lesion. Sinuses/Orbits: No acute finding. Other: None. CT CERVICAL SPINE FINDINGS Alignment: Normal. Skull base and vertebrae: No acute fracture. No primary bone lesion or focal pathologic process. Soft tissues and spinal canal: No prevertebral fluid or swelling. No visible canal hematoma. Disc levels: Straightening of cervical lordosis and discogenic degenerative changes with mild disc space narrowing and marginal osteophytes from the C3 through C5 levels. Upper chest: Negative. Other: Negative appear IMPRESSION: 1. Right posterior frontal lobe acute hemorrhage measuring up to 29 mm with small area of surrounding edema and local mass effect. No significant midline shift or herniation. Follow-up to ensure resolution is recommended to exclude an  underlying mass. 2. Small volume of subarachnoid hemorrhage overlying the right frontal convexity. 3. Hypoattenuation within subcortical white matter and bilateral parietal and occipital lobes. Findings are suggestive of PRES in the setting of hypertension. This can be further characterized with MRI of the brain. 4. No acute fracture or dislocation of the cervical spine. These results were called by telephone at the time of interpretation on 12/19/2016 at 5:12 pm to Dr. Lajean Saver , who verbally acknowledged these results. Electronically Signed   By: Kristine Garbe M.D.   On: 12/19/2016 17:23   Ct Angio Neck W And/or Wo Contrast  Result Date: 12/19/2016 CLINICAL DATA:  Intraparenchymal and subarachnoid hemorrhage. EXAM: CT ANGIOGRAPHY HEAD AND NECK TECHNIQUE: Multidetector CT imaging of the head and neck was performed using the standard protocol during bolus administration of intravenous contrast. Multiplanar CT image reconstructions and MIPs were obtained to evaluate the vascular anatomy. Carotid stenosis measurements (when applicable) are obtained utilizing NASCET criteria, using the distal internal carotid diameter as the denominator. CONTRAST:  50 cc Isovue 370 intravenous COMPARISON:  Noncontrast head CT from earlier today FINDINGS: CTA NECK FINDINGS Aortic arch: Atheromatous wall thickening that is mild. No acute finding or aneurysm. Right carotid system: Mild atheromatous plaque at the common carotid bifurcation without stenosis or ulceration. Beaded ICA consistent with fibromuscular dysplasia. No superimposed acute dissection. Left carotid system: Scattered atheromatous plaque on the distal common carotid and proximal ICA, moderate for age. No flow limiting stenosis or ulceration. Beading of the left ICA consistent with fibromuscular dysplasia. No superimposed dissection. Vertebral arteries: Proximal subclavian atherosclerosis without flow limiting stenosis. Both vertebral arteries are smooth  and widely patent to the dura. Left vertebral dominance. Skeleton: No acute or aggressive finding. Other neck: Negative for mass or inflammation. Upper chest: Negative Review of the MIP images confirms the above findings CTA HEAD FINDINGS Anterior circulation: Moderate calcified plaque  on the carotid siphons without flow limiting stenosis. Standard branching. There is no evidence of aneurysm or vascular malformation. No major branch occlusion or proximal flow limiting stenosis. Posterior circulation: Left dominant vertebral artery. The vertebral and basilar arteries are smooth and widely patent. Aplastic right and mildly hypoplastic left P1 segments with large posterior communicating arteries. No branch occlusion, stenosis, aneurysm, or evidence of vascular malformation. Venous sinuses: Patent on the delayed phase. Anatomic variants: Fetal type PCA circulation, especially on the right. Delayed phase: No masslike enhancement. Pending brain MRI. The right frontal parietal hematoma is larger than before, measuring 27 x 34 x 35 mm as compared to 20 x 31 x 28 mm. Local subarachnoid hemorrhage and mild cerebral edema without shift or herniation. Review of the MIP images confirms the above findings IMPRESSION: 1. No vascular explanation for the right frontal parietal hematoma. Dural venous sinuses are patent and there is no evidence of vascular malformation. 2. The 16 cc hematoma is mildly increased from earlier CT. Negative for shift or herniation. 3. Fibromuscular dysplasia of the bilateral cervical ICA. 4. Moderate atherosclerosis for age. Negative for flow limiting stenosis. Electronically Signed   By: Monte Fantasia M.D.   On: 12/19/2016 19:37   Ct Cervical Spine Wo Contrast  Result Date: 12/19/2016 CLINICAL DATA:  51 y/o  F; seizure. EXAM: CT HEAD WITHOUT CONTRAST CT CERVICAL SPINE WITHOUT CONTRAST TECHNIQUE: Multidetector CT imaging of the head and cervical spine was performed following the standard protocol  without intravenous contrast. Multiplanar CT image reconstructions of the cervical spine were also generated. COMPARISON:  02/11/2015 CT of the head. FINDINGS: CT HEAD FINDINGS Brain: Right posterior frontal lobe acute hemorrhage measuring 22 x 29 x 21 mm (AP x ML x CC series 3 image 26 and series 5, image 34). Small volume of subarachnoid hemorrhage with overlying the right frontal convexity and along the falx. There is a small surrounding region of hypoattenuation in the brain compatible with edema with local mass effect effacing sulci. No significant midline shift. No evidence for herniation. No large territory acute stroke identified. There are foci of hypoattenuation within subcortical white matter in the bilateral parietal lobes and occipital lobes. The distribution is suggestive of PRES. Vascular: Extensive calcific atherosclerosis of the carotid siphons. Skull: Normal. Negative for fracture or focal lesion. Sinuses/Orbits: No acute finding. Other: None. CT CERVICAL SPINE FINDINGS Alignment: Normal. Skull base and vertebrae: No acute fracture. No primary bone lesion or focal pathologic process. Soft tissues and spinal canal: No prevertebral fluid or swelling. No visible canal hematoma. Disc levels: Straightening of cervical lordosis and discogenic degenerative changes with mild disc space narrowing and marginal osteophytes from the C3 through C5 levels. Upper chest: Negative. Other: Negative appear IMPRESSION: 1. Right posterior frontal lobe acute hemorrhage measuring up to 29 mm with small area of surrounding edema and local mass effect. No significant midline shift or herniation. Follow-up to ensure resolution is recommended to exclude an underlying mass. 2. Small volume of subarachnoid hemorrhage overlying the right frontal convexity. 3. Hypoattenuation within subcortical white matter and bilateral parietal and occipital lobes. Findings are suggestive of PRES in the setting of hypertension. This can be  further characterized with MRI of the brain. 4. No acute fracture or dislocation of the cervical spine. These results were called by telephone at the time of interpretation on 12/19/2016 at 5:12 pm to Dr. Lajean Saver , who verbally acknowledged these results. Electronically Signed   By: Kristine Garbe M.D.   On: 12/19/2016  17:23   Mr Brain Wo Contrast  Result Date: 12/19/2016 CLINICAL DATA:  Seizure and altered mental status. EXAM: MRI HEAD WITHOUT CONTRAST TECHNIQUE: Multiplanar, multiecho pulse sequences of the brain and surrounding structures were obtained without intravenous contrast. COMPARISON:  CTA head neck from earlier the same day FINDINGS: Brain: Acute parenchymal hematoma in the posterior right frontal lobe is size stable from CT 60 minutes prior. There is subarachnoid FLAIR signal hyperintensity along the right more than left convexity. On the right there is associated susceptibility on gradient imaging correlating with subarachnoid hemorrhage by CT. There is bilateral cortical and subcortical T2 hyperintensity within the predominantly high and posterior cerebral convexities, also seen along the left putamen. No hydrocephalus. No primary infarct findings. Vascular: Major flow voids are preserved. Skull and upper cervical spine: Negative for marrow lesion Sinuses/Orbits: Negative Other: These results were called by telephone at the time of interpretation on 12/19/2016 at 8:30 pm to Dr. Lajean Saver , who verbally acknowledged these results. IMPRESSION: 1. Findings of posterior reversible encephalopathy syndrome. 2. Size stable posterior right frontal hematoma compared to CTA 1 hour prior. Electronically Signed   By: Monte Fantasia M.D.   On: 12/19/2016 20:35   Dg Chest Port 1 View  Result Date: 12/25/2016 CLINICAL DATA:  Intubation. EXAM: PORTABLE CHEST 1 VIEW COMPARISON:  07/29/2016 . FINDINGS: Endotracheal tube noted with its tip 4.1 cm above the carina. Heart size normal. No focal  infiltrate. Prominent nipple shadows again noted. No pleural effusion or pneumothorax. IMPRESSION: 1.  Endotracheal tube tip noted 4.1 cm above the carina. 2. No acute cardiopulmonary disease. Electronically Signed   By: Marcello Moores  Register   On: 12/25/2016 07:14   Dg Swallowing Func-speech Pathology  Result Date: 12/20/2016 Objective Swallowing Evaluation: Type of Study: MBS-Modified Barium Swallow Study Patient Details Name: ILIA DIMAANO MRN: [id number] Date of Birth: Mar 20, 1966 Today's Date: 12/20/2016 Time: SLP Start Time (ACUTE ONLY): 0914-SLP Stop Time (ACUTE ONLY): 4098 SLP Time Calculation (min) (ACUTE ONLY): 13 min Past Medical History: Past Medical History: Diagnosis Date . Anemia  . Bruises easily  . Dialysis patient ([place])  . ESRD on dialysis ([place])  . Hyperlipidemia  . Hypertension  . Renal disorder   rt renal mass / < functioning of left kidney - being prepared for possible dialysis . Right renal mass  Past Surgical History: Past Surgical History: Procedure Laterality Date . AV FISTULA PLACEMENT Right 07/11/2014  Procedure: ARTERIOVENOUS (AV) FISTULA CREATION RIGHT ARM BRACHIO-CEPHALIC WITH ATTEMPTED RADIO-CEPHALIC (AV) FISTULA;  Surgeon: Mal Misty, MD;  Location: [place];  Service: Vascular;  Laterality: Right; . COLONOSCOPY WITH PROPOFOL N/A 09/04/2016  Procedure: COLONOSCOPY WITH PROPOFOL;  Surgeon: Carol Ada, MD;  Location: [facility];  Service: Endoscopy;  Laterality: N/A; . ECTOPIC PREGNANCY SURGERY  1987 . ESOPHAGOGASTRODUODENOSCOPY N/A 07/30/2016  Procedure: ESOPHAGOGASTRODUODENOSCOPY (EGD);  Surgeon: Carol Ada, MD;  Location: [facility];  Service: Endoscopy;  Laterality: N/A;  Bedside . ESOPHAGOGASTRODUODENOSCOPY (EGD) WITH PROPOFOL N/A 09/04/2016  Procedure: ESOPHAGOGASTRODUODENOSCOPY (EGD) WITH PROPOFOL;  Surgeon: Carol Ada, MD;  Location: [facility];  Service: Endoscopy;  Laterality: N/A; . INSERTION OF DIALYSIS CATHETER Right 07/11/2014  Procedure: INSERTION OF DIALYSIS CATHETER IN  RIGHT INTERNAL JUGULAR ;  Surgeon: Mal Misty, MD;  Location: [place];  Service: Vascular;  Laterality: Right; . LAPAROSCOPIC NEPHRECTOMY Right 07/25/2014  Procedure: RIGHT LAPAROSCOPIC RADICAL NEPHRECTOMY ;  Surgeon: Ardis Hughs, MD;  Location: [facility];  Service: Urology;  Laterality: Right; . PATCH ANGIOPLASTY Right 07/11/2014  Procedure:  PATCH ANGIOPLASTY OF RIGHT RADIAL ARTERY USING CEPHALIC VEIN.;  Surgeon: Mal Misty, MD;  Location: [place];  Service: Vascular;  Laterality: Right; . THROMBECTOMY W/ EMBOLECTOMY Right 07/11/2014  Procedure: THROMBECTOMY OF RIGHT RADIAL ARTERY  ;  Surgeon: Mal Misty, MD;  Location[facility];  Service: Vascular;  Laterality: Right; HPI: 51 yo F with history of ESRD on Peritoneal Dialysis, HTN, Right renal mass, and Anemia, who was found down on the floor seizing. She was found to have a large right frontal hemorrhage with surrounding edema and mass effect, also with small SAH and signs of PRES. Pt describes subtle dysphagia and vocal quality changes following thyroid surgery in April. Subjective: pt alert, eager for POs, particularly before she starts dialysis Assessment / Plan / Recommendation CHL IP CLINICAL IMPRESSIONS 12/20/2016 Clinical Impression Pt's oropharyngeal swallow appears to be [facility], with no aspiration or penetration observed. Of note, she did not appear to have as many audible swallows or throat clearings during MBS as was observed at bedside, but when she did have throat clearing it was no associated with any airway compromise. She fairly consistently completed second swallows, particularly with thin liquids, but there were only trace residuals (mostly a small coating of the tongue/base of tongue) remaining throughout her oropharynx. Recommend that pt initiate regular textures and thin liquids. SLP f/u not needed for swallowing, but recommend MD order SLP cognitive-linguistic evaluation. SLP Visit Diagnosis Dysphagia, unspecified (R13.10) Attention and  concentration deficit following -- Frontal lobe and executive function deficit following -- Impact on safety and function Mild aspiration risk   CHL IP TREATMENT RECOMMENDATION 12/20/2016 Treatment Recommendations No treatment recommended at this time   Prognosis 12/20/2016 Prognosis for Safe Diet Advancement Good Barriers to Reach Goals -- Barriers/Prognosis Comment -- CHL IP DIET RECOMMENDATION 12/20/2016 SLP Diet Recommendations Regular solids;Thin liquid Liquid Administration via Cup;Straw Medication Administration Whole meds with liquid Compensations Slow rate;Small sips/bites Postural Changes Remain semi-upright after after feeds/meals (Comment);Seated upright at 90 degrees   CHL IP OTHER RECOMMENDATIONS 12/20/2016 Recommended Consults -- Oral Care Recommendations Oral care BID Other Recommendations --   CHL IP FOLLOW UP RECOMMENDATIONS 12/20/2016 Follow up Recommendations Other (comment)   No flowsheet data found.     CHL IP ORAL PHASE 12/20/2016 Oral Phase WFL Oral - Pudding Teaspoon -- Oral - Pudding Cup -- Oral - Honey Teaspoon -- Oral - Honey Cup -- Oral - Nectar Teaspoon -- Oral - Nectar Cup -- Oral - Nectar Straw -- Oral - Thin Teaspoon -- Oral - Thin Cup -- Oral - Thin Straw -- Oral - Puree -- Oral - Mech Soft -- Oral - Regular -- Oral - Multi-Consistency -- Oral - Pill -- Oral Phase - Comment --  CHL IP PHARYNGEAL PHASE 12/20/2016 Pharyngeal Phase WFL Pharyngeal- Pudding Teaspoon -- Pharyngeal -- Pharyngeal- Pudding Cup -- Pharyngeal -- Pharyngeal- Honey Teaspoon -- Pharyngeal -- Pharyngeal- Honey Cup -- Pharyngeal -- Pharyngeal- Nectar Teaspoon -- Pharyngeal -- Pharyngeal- Nectar Cup -- Pharyngeal -- Pharyngeal- Nectar Straw -- Pharyngeal -- Pharyngeal- Thin Teaspoon -- Pharyngeal -- Pharyngeal- Thin Cup -- Pharyngeal -- Pharyngeal- Thin Straw -- Pharyngeal -- Pharyngeal- Puree -- Pharyngeal -- Pharyngeal- Mechanical Soft -- Pharyngeal -- Pharyngeal- Regular -- Pharyngeal -- Pharyngeal- Multi-consistency --  Pharyngeal -- Pharyngeal- Pill -- Pharyngeal -- Pharyngeal Comment --  CHL IP CERVICAL ESOPHAGEAL PHASE 12/20/2016 Cervical Esophageal Phase WFL Pudding Teaspoon -- Pudding Cup -- Honey Teaspoon -- Honey Cup -- Nectar Teaspoon -- Nectar Cup -- Nectar Straw -- Thin Teaspoon --  Thin Cup -- Thin Straw -- Puree -- Mechanical Soft -- Regular -- Multi-consistency -- Pill -- Cervical Esophageal Comment -- No flowsheet data found. Germain Osgood 12/20/2016, 9:44 AM  Germain Osgood, M.A. CCC-SLP [phone]              Time Spent in minutes  25   Louellen Molder M.D on 12/30/2016 at 2:25 PM  Between 7am to 7pm - Pager - [phone]  After 7pm go to [email or web address] - password [facility]  [facility] -  Office  [phone]

## 2016-12-30 NOTE — Progress Notes (Signed)
CM met with patient, her mother in law and Melissa with CIR yesterday about plan at d/c. Pt does not have a reliable caregiver at discharge. Pts mother in law is not physically able to provide the care the patient is going to need. Pt asked to have until today to come up with a family member that will be able to provide the needed care after rehab.  CM and Melissa with CIR met with the patient today and she still does not have a concrete person for after rehab. CM has spoken to CSW and there are no SNF rehabs that will accept the patient receiving peritoneal dialysis in [place].  Pts brother in law and his wife are visiting from [place] went over the issues with d/c and they are talking about what assistance they may be able to provide if any. CM notified Dr Clementeen Graham of issues pertaining to discharge. Dr Clementeen Graham inquired about LTACH. CM following.

## 2016-12-30 NOTE — Progress Notes (Signed)
Subjective:   900 cc UF with HD overnight.  No new c/o.    Objective Vital signs in last 24 hours: Vitals:   12/30/16 0117 12/30/16 0425 12/30/16 0700 12/30/16 0939  BP: 128/76 (!) 150/77  (!) 155/90  Pulse: 65 70 (!) 0 78  Resp: 18 20  18   Temp: 98 F (36.7 C) 97.5 F (36.4 C)  97.9 F (36.6 C)  TempSrc: Oral Axillary  Oral  SpO2: 97% 95%  92%  Weight:      Height:       Weight change: 0.6 kg (1 lb 5.2 oz)  Intake/Output Summary (Last 24 hours) at 12/30/16 1041 Last data filed at 12/30/16 0810  Gross per 24 hour  Intake            10745 ml  Output            10909 ml  Net             -164 ml   Physical Exam: General: alert Heart: RRR Lungs: mostly clear Abdomen: soft, PD cath in place Extremities: minimal edema Dialysis Access: PD cath and right upper ARM AVG     12/30/2016,10:41 AM  LOS: 11 days   Summary: Pt is a 51 y.o. yo female with ESRD who was admitted on 12/19/2016 with sz/hemiparesis- found to have right sided frontal lobe hemorrhage   Assess/Plan: 1. Intracranial CVA/ hemorrhage- with L hemiparesis. Did not require surgery. PRES was also present- was making some clinical recovery then had "mini-code" at dialysis on 6/22 and spent time in ICU, now back to floor and awaiting possible CIR transfer. 2. ESRD - hemodialysis was complicated by recurrent issues with labile BP's so she is now back on PD.  3. Anemia- history of GIB and low hgb- has responded too well to ESA and and Hb now > 15. This is part of reason for hypertension.  Holding ESA now.  4. MBD of CKD- resumed phoslo, calcitriol and sensipar  5. HTN/volume- on 5 bp meds now (was on 3 at home).  Vol /wt's are up a bit.  Will ^ UF to all 2.5% fluids.    [name] [employer] pgr [phone]   12/30/2016, 10:41 AM    Labs: Basic Metabolic Panel:  Recent Labs Lab 12/25/16 0245 12/26/16 0443 12/27/16 0248 12/28/16 0317 12/29/16 0532 12/30/16 0525  NA 134* 137 137 136  137 134*  K 4.8 4.4 4.7 4.4 3.8 4.5  CL 99* 99* 99* 100* 99* 98*  CO2 20* 21* 22 22 24 24   GLUCOSE 122* 113* 116* 113* 124* 114*  BUN 65* 59* 60* 54* 52* 53*  CREATININE 10.94* 11.08* 11.78* 10.86* 10.91* 10.92*  CALCIUM 8.8* 8.9 9.1 9.5 9.5 9.7  PHOS 7.5* 10.1* 9.5*  --   --   --    Liver Function Tests:  Recent Labs Lab 12/27/16 0248  ALBUMIN 2.6*   No results for input(s): LIPASE, AMYLASE in the last 168 hours. No results for input(s): AMMONIA in the last 168 hours. CBC:  Recent Labs Lab 12/24/16 0508 12/24/16 1238 12/25/16 0245 12/26/16 0443  WBC 7.9 9.9 10.6* 10.3  HGB 17.7* 18.0* 18.7* 17.6*  HCT 55.0* 55.3* 55.9* 55.7*  MCV 100.9* 100.2* 98.9 102.6*  PLT 278 253 159 218   Cardiac Enzymes: No results for input(s): CKTOTAL, CKMB, CKMBINDEX, TROPONINI in the last 168 hours. CBG:  Recent Labs Lab 12/24/16 1108  GLUCAP 148*    Iron Studies:  No results for input(s): IRON, TIBC, TRANSFERRIN, FERRITIN in the last 72 hours. Studies/Results: No results found. Medications: Infusions: . sodium chloride Stopped (12/26/16 1730)  . dialysis solution 2.5% low-MG/low-CA    . [START ON 12/31/2016] dialysis solution 2.5% low-MG/low-CA    . levETIRAcetam 500 mg (12/30/16 0916)    Scheduled Medications: . amLODipine  10 mg Oral Daily  . calcitRIOL  0.25 mcg Oral Q M,W,F-1800  . calcium acetate  1,334 mg Oral TID WC  . carvedilol  12.5 mg Oral BID WC  . cinacalcet  60 mg Oral Q supper  . cloNIDine  0.1 mg Oral TID  . famotidine  20 mg Oral Daily  . gentamicin cream  1 application Topical Daily  . hydrALAZINE  25 mg Oral Q8H  . levothyroxine  88 mcg Oral QAC breakfast  . losartan  100 mg Oral QHS  . mouth rinse  15 mL Mouth Rinse BID  . multivitamin  1 tablet Oral Daily  . pantoprazole  40 mg Oral BID  . polyethylene glycol  17 g Oral Daily  . senna-docusate  1 tablet Oral BID    have reviewed scheduled and prn medications.

## 2016-12-30 NOTE — Progress Notes (Signed)
Early pm Note  Making slow progress.  Learning to use R side to help with balance, scooting, bed mobility, standing and transfers.    12/30/16 1644  PT Visit Information  Last PT Received On 12/30/16  Assistance Needed +2  History of Present Illness Pt is a 51 y/o female who presents s/p witnessed seizure at home with new L-sided weakness. No further seizure activity noted since admission. Imaging revealed R frontal lobe hemorrhage. Coded in hemodiaysis  Subjective Data  Patient Stated Goal Pt did not state goals.  Precautions  Precautions Fall  Precaution Comments Peritoneal dialysis patient  Pain Assessment  Pain Assessment No/denies pain  Cognition  Arousal/Alertness Awake/alert  Behavior During Therapy Flat affect  Overall Cognitive Status Impaired/Different from baseline (may not be at baseline, but focuses/follows commands well.)  Bed Mobility  Overal bed mobility Needs Assistance  Bed Mobility Rolling;Sidelying to Sit  Rolling Min assist  Sidelying to sit Max assist  General bed mobility comments cues and assist for direction with pt doing majority of roll.  cued for best technique for side to sit  Transfers  Overall transfer level Needs assistance  Equipment used 2 person hand held assist;Rolling walker (2 wheeled)  Transfers Sit to/from [facility]  Sit to Stand Mod assist;+2 physical assistance  Stand pivot transfers Max assist;+2 physical assistance  General transfer comment pt assisted scooting to EOB using R UE as support or pivot.  Cues for technique, assist to come forward and up with additional assist to keep stand symmetrical.  Ambulation/Gait  General Gait Details not ready  Modified Rankin (Stroke Patients Only)  Pre-Morbid Rankin Score 0  Modified Rankin 5  Balance  Overall balance assessment Needs assistance  Sitting-balance support Feet supported  Sitting balance-Leahy Scale Poor  Sitting balance - Comments sat EOB  x>10 min working up  balance, upright posture, w/shifting, scooting.  Working to teach pt how to compensate with strong side keep head up to straighten out her posture.  Postural control Left lateral lean;Posterior lean  Standing balance support Bilateral upper extremity supported  Standing balance-Leahy Scale Zero  Standing balance comment stood x2 working on upright posture with head up and hips pelvis neutral, w/shift and balance.  Used unilateral device to see if this gave her supportive assist  Other Exercises  Other Exercises PROM to L LE, attempting to facilitate movement L U and LLE  PT - End of Session  Activity Tolerance Patient tolerated treatment well  Patient left in bed;with call bell/phone within reach;with chair alarm set  Nurse Communication Mobility status;Need for lift equipment  PT - Assessment/Plan  PT Plan Current plan remains appropriate  PT Visit Diagnosis Hemiplegia and hemiparesis  Hemiplegia - Right/Left Left  Hemiplegia - dominant/non-dominant Non-dominant  Hemiplegia - caused by Nontraumatic intracerebral hemorrhage  PT Frequency (ACUTE ONLY) Min 4X/week  Recommendations for Other Services Rehab consult  Follow Up Recommendations CIR;Supervision/Assistance - 24 hour  PT equipment Wheelchair (measurements PT);Wheelchair cushion (measurements PT)  AM-PAC PT "6 Clicks" Daily Activity Outcome Measure  Difficulty turning over in bed (including adjusting bedclothes, sheets and blankets)? 1  Difficulty moving from lying on back to sitting on the side of the bed?  1  Difficulty sitting down on and standing up from a chair with arms (e.g., wheelchair, bedside commode, etc,.)? 1  Help needed moving to and from a bed to chair (including a wheelchair)? 1  Help needed walking in hospital room? 1  Help needed climbing 3-5 steps with a  railing?  1  6 Click Score 6  Mobility G Code  CN  PT Goal Progression  Progress towards PT goals Progressing toward goals (slow progress)  Acute Rehab PT  Goals  Time For Goal Achievement 01/04/17  Potential to Achieve Goals Fair  PT Time Calculation  PT Start Time (ACUTE ONLY) 1425  PT Stop Time (ACUTE ONLY) 1450  PT Time Calculation (min) (ACUTE ONLY) 25 min  PT General Charges  $$ ACUTE PT VISIT 1 Procedure  PT Treatments  $Therapeutic Activity 23-37 mins   12/30/2016  Donnella Sham, PT [phone]  (pager)

## 2016-12-30 NOTE — Progress Notes (Signed)
PD cycle started per Dialysis RN at this time.    Ave Filter, RN

## 2016-12-30 NOTE — Progress Notes (Deleted)
Physical Therapy Treatment Patient Details Name: Terry Juarez MRN: [id number] DOB: 05/06/1966 Today's Date: 12/30/2016    History of Present Illness Pt is a 51 y/o female who presents s/p witnessed seizure at home with new L-sided weakness. No further seizure activity noted since admission. Imaging revealed R frontal lobe hemorrhage. Coded in hemodiaysis    PT Comments    Making slow progress.  Learning to use R side to help with balance, scooting, bed mobility, standing and transfers.    Follow Up Recommendations  CIR;Supervision/Assistance - 24 hour     Equipment Recommendations  Wheelchair (measurements PT);Wheelchair cushion (measurements PT)    Recommendations for Other Services Rehab consult     Precautions / Restrictions Precautions Precautions: Fall Precaution Comments: Peritoneal dialysis patient    Mobility  Bed Mobility Overal bed mobility: Needs Assistance Bed Mobility: Sit to Sidelying;Rolling Rolling: Min assist Sidelying to sit: Max assist     Sit to sidelying: Max assist;+2 for physical assistance General bed mobility comments: cues and assist for direction, pt assisting through out.  Transfers Overall transfer level: Needs assistance Equipment used: 2 person hand held assist;Rolling walker (2 wheeled) Transfers: Squat Pivot Transfers Sit to Stand: Mod assist;+2 physical assistance Stand pivot transfers: Max assist;+2 physical assistance Squat pivot transfers: Max assist;+2 safety/equipment     General transfer comment: pt assisted scoot to Edge of chair, assisted with stand and pivoted without steps.  Ambulation/Gait             General Gait Details: not ready   Stairs            Wheelchair Mobility    Modified Rankin (Stroke Patients Only) Modified Rankin (Stroke Patients Only) Pre-Morbid Rankin Score: No symptoms Modified Rankin: Severe disability     Balance Overall balance assessment: Needs  assistance Sitting-balance support: Feet supported Sitting balance-Leahy Scale: Poor Sitting balance - Comments: sat EOB  x>10 min working up balance, upright posture, w/shifting, scooting.  Working to teach pt how to compensate with strong side keep head up to straighten out her posture. Postural control: Left lateral lean;Posterior lean Standing balance support: Bilateral upper extremity supported Standing balance-Leahy Scale: Zero Standing balance comment: stood x2 working on upright posture with head up and hips pelvis neutral, w/shift and balance.  Used unilateral device to see if this gave her supportive assist                            Cognition Arousal/Alertness: Awake/alert Behavior During Therapy: Flat affect Overall Cognitive Status: Within Functional Limits for tasks assessed                                        Exercises Other Exercises Other Exercises: PROM to L LE, attempting to facilitate movement L U and LLE    General Comments        Pertinent Vitals/Pain Pain Assessment: No/denies pain    Home Living                      Prior Function            PT Goals (current goals can now be found in the care plan section) Acute Rehab PT Goals Patient Stated Goal: Pt did not state goals. Time For Goal Achievement: 01/04/17 Potential to Achieve Goals: Fair Progress towards PT goals: Progressing toward  goals (but very slow progress)    Frequency    Min 4X/week      PT Plan Current plan remains appropriate    Co-evaluation              AM-PAC PT "6 Clicks" Daily Activity  Outcome Measure  Difficulty turning over in bed (including adjusting bedclothes, sheets and blankets)?: Total Difficulty moving from lying on back to sitting on the side of the bed? : Total Difficulty sitting down on and standing up from a chair with arms (e.g., wheelchair, bedside commode, etc,.)?: Total Help needed moving to and from a bed  to chair (including a wheelchair)?: Total Help needed walking in hospital room?: Total Help needed climbing 3-5 steps with a railing? : Total 6 Click Score: 6    End of Session   Activity Tolerance: Patient tolerated treatment well Patient left: in bed;with call bell/phone within reach;with bed alarm set Nurse Communication: Mobility status PT Visit Diagnosis: Hemiplegia and hemiparesis Hemiplegia - Right/Left: Left Hemiplegia - dominant/non-dominant: Non-dominant Hemiplegia - caused by: Nontraumatic intracerebral hemorrhage     Time: 9437-0052 PT Time Calculation (min) (ACUTE ONLY): 12 min  Charges:  $Therapeutic Activity: 8-22 mins                    G Codes:       01/01/2017  Donnella Sham, PT [phone]  (pager)   Terry Juarez 2017/01/01, 5:12 PM

## 2016-12-30 NOTE — Progress Notes (Signed)
Inpatient Rehabilitation  Attempted to meet with patient and family for clarification about available 24/7 caregiver support as well as partner to manage peritoneal dialysis.  Patient alone with no family present at bedside today.  Patient called family and requested them to clarify if anyone will assist her.  Given that there is no one at this time, we do not have a bed to offer this patient.  Discussed with RN CM.  Please call with questions.   Carmelia Roller., CCC/SLP Admission Coordinator  [place]  Cell [phone]

## 2016-12-30 NOTE — Progress Notes (Signed)
Stopped by to visit w/ pt, whose brother is visiting her today, but neither he nor her mother-in-law were in rm wi/ pt then. Provided emotional/spiritual support and prayer -- which pt much appreciated. She has high hopes for rehab, and was thrilled to be able to converse w/ her today. When had visited her on 4[address] ICU, I'd only been able to speak w/ her faithful mother-in-law, who was always in rm, working on her needlework projects by the window. Today gave pt prayer shawl, and she appreciated being covered with prayers of its makers for her recovery. Chaplain available for f/u.   12/30/16 1600  Clinical Encounter Type  Visited With Patient  Visit Type Follow-up;Psychological support;Spiritual support;Social support  Referral From Chaplain  Spiritual Encounters  Spiritual Needs Prayer;Emotional  Stress Factors  Patient Stress Factors Health changes;Loss of control   Gerrit Heck, Chaplain

## 2016-12-31 LAB — TRIGLYCERIDES: Triglycerides: 150 mg/dL — ABNORMAL HIGH (ref <150)

## 2016-12-31 LAB — BASIC METABOLIC PANEL
Anion gap: 13 (ref 5–15)
BUN: 49 mg/dL — AB (ref 6–20)
CHLORIDE: 98 mmol/L — AB (ref 101–111)
CO2: 25 mmol/L (ref 22–32)
CREATININE: 10.53 mg/dL — AB (ref 0.44–1.00)
Calcium: 10 mg/dL (ref 8.9–10.3)
GFR calc Af Amer: 4 mL/min — ABNORMAL LOW (ref >60)
GFR calc non Af Amer: 4 mL/min — ABNORMAL LOW (ref >60)
GLUCOSE: 119 mg/dL — AB (ref 65–99)
Potassium: 3.7 mmol/L (ref 3.5–5.1)
Sodium: 136 mmol/L (ref 135–145)

## 2016-12-31 LAB — MAGNESIUM: Magnesium: 1.9 mg/dL (ref 1.7–2.4)

## 2016-12-31 MED ORDER — DELFLEX-LC/2.5% DEXTROSE 394 MOSM/L IP SOLN: INTRAPERITONEAL | Status: DC

## 2016-12-31 MED ORDER — GENTAMICIN SULFATE 0.1 % EX CREA
1.0000 "application " | TOPICAL_CREAM | Freq: Every day | CUTANEOUS | Status: DC
  Administered 2017-01-01: 1 via TOPICAL
  Filled 2016-12-31: qty 15

## 2016-12-31 MED ORDER — LEVETIRACETAM 500 MG PO TABS
500.0000 mg | ORAL_TABLET | Freq: Two times a day (BID) | ORAL | Status: DC
  Administered 2016-12-31 – 2017-01-04 (×9): 500 mg via ORAL
  Filled 2016-12-31 (×9): qty 1

## 2016-12-31 MED ORDER — HEPARIN 1000 UNIT/ML FOR PERITONEAL DIALYSIS
INTRAPERITONEAL | Status: DC | PRN
  Filled 2016-12-31: qty 3000

## 2016-12-31 MED ORDER — HEPARIN 1000 UNIT/ML FOR PERITONEAL DIALYSIS: 500.0000 [IU] | INTRAMUSCULAR | Status: DC | PRN

## 2016-12-31 MED ORDER — DELFLEX-LC/4.25% DEXTROSE 483 MOSM/L IP SOLN: INTRAPERITONEAL | Status: DC

## 2016-12-31 MED ORDER — DELFLEX-LC/4.25% DEXTROSE 483 MOSM/L IP SOLN
INTRAPERITONEAL | Status: DC | PRN
  Filled 2016-12-31: qty 3000

## 2016-12-31 NOTE — Progress Notes (Addendum)
[facility] Progress Note   Subjective: Again reiterates she will NOT do HD again given her recent experience with cardiac arrest on dialysis. 1600 off with PD overnight which is better. Wt;s down and BP's better.   Objective Vitals:   12/30/16 2138 12/31/16 0100 12/31/16 0530 12/31/16 0600  BP: (!) 147/69 132/75 140/78   Pulse: 69 74 74   Resp: 16 16 18    Temp: 98.4 F (36.9 C) 98.6 F (37 C) 98.5 F (36.9 C)   TempSrc: Oral Oral Oral   SpO2: 96% 94% 97%   Weight:    59.8 kg (131 lb 13.4 oz)  Height:       Physical Exam General: Awake, alert, NAD Heart: B4,W9. 2/6 systolic M.  Lungs: CTAB Abdomen: Active BS. PD cath RUQ Drsg intact Extremities: No LE edema.  Dialysis Access: RUA AVG + bruit aneurysmal   Additional Objective Labs: Basic Metabolic Panel:  Recent Labs Lab 12/25/16 0245 12/26/16 0443 12/27/16 0248  12/29/16 0532 12/30/16 0525 12/31/16 0256  NA 134* 137 137  < > 137 134* 136  K 4.8 4.4 4.7  < > 3.8 4.5 3.7  CL 99* 99* 99*  < > 99* 98* 98*  CO2 20* 21* 22  < > 24 24 25   GLUCOSE 122* 113* 116*  < > 124* 114* 119*  BUN 65* 59* 60*  < > 52* 53* 49*  CREATININE 10.94* 11.08* 11.78*  < > 10.91* 10.92* 10.53*  CALCIUM 8.8* 8.9 9.1  < > 9.5 9.7 10.0  PHOS 7.5* 10.1* 9.5*  --   --   --   --   < > = values in this interval not displayed. Liver Function Tests:  Recent Labs Lab 12/27/16 0248  ALBUMIN 2.6*   No results for input(s): LIPASE, AMYLASE in the last 168 hours. CBC:  Recent Labs Lab 12/24/16 1238 12/25/16 0245 12/26/16 0443  WBC 9.9 10.6* 10.3  HGB 18.0* 18.7* 17.6*  HCT 55.3* 55.9* 55.7*  MCV 100.2* 98.9 102.6*  PLT 253 159 218   Blood Culture    Component Value Date/Time   SDES BLOOD LEFT HAND 07/30/2016 0537   SPECREQUEST BOTTLES DRAWN AEROBIC ONLY 5CC 07/30/2016 0537   CULT NO GROWTH 5 DAYS 07/30/2016 0537   REPTSTATUS 08/04/2016 FINAL 07/30/2016 0537    Cardiac Enzymes: No results for input(s): CKTOTAL,  CKMB, CKMBINDEX, TROPONINI in the last 168 hours. CBG:  Recent Labs Lab 12/24/16 1108  GLUCAP 148*   Iron Studies: No results for input(s): IRON, TIBC, TRANSFERRIN, FERRITIN in the last 72 hours. @lablastinr3 @ Studies/Results: No results found. Medications: . sodium chloride Stopped (12/26/16 1730)  . dialysis solution 2.5% low-MG/low-CA     . amLODipine  10 mg Oral Daily  . calcitRIOL  0.25 mcg Oral Q M,W,F-1800  . calcium acetate  1,334 mg Oral TID WC  . carvedilol  12.5 mg Oral BID WC  . cinacalcet  60 mg Oral Q supper  . cloNIDine  0.1 mg Oral TID  . famotidine  20 mg Oral Daily  . gentamicin cream  1 application Topical Daily  . hydrALAZINE  25 mg Oral Q8H  . levETIRAcetam  500 mg Oral BID  . levothyroxine  88 mcg Oral QAC breakfast  . losartan  100 mg Oral QHS  . mouth rinse  15 mL Mouth Rinse BID  . multivitamin  1 tablet Oral Daily  . pantoprazole  40 mg Oral BID  . polyethylene glycol  17 g Oral  Daily  . senna-docusate  1 tablet Oral BID     Summary: Pt is a 51 y.o. yo female with ESRD who was admitted on 12/19/2016 with sz/hemiparesis- found to have right sided frontal lobe hemorrhage. Now with PRES. Seizures controlled in [place].    Assess/Plan: 1. Intracranial CVA/ hemorrhage- with L hemiparesis. Did not require surgery. PRES was also present. Was making some clinical recovery then had cardiac arrest at dialysis on 6/22. Now awaiting rehab disposition.  2. ESRD - hemodialysis was complicated (as above) so she is now back on PD. Pt refusing return to HD. Appears her family may be willing to take on home PD for pt. Cont PD w/ half 2.5% and half 4.25% 3. Cardiac arrest - while on HD 6/22 3. Anemia- history of GIB and low hgb.  Responded too well to ESA and and Hb now > 15. This is part of reason for hypertension.  Holding ESA now.  4. MBD of CKD- resumed phoslo, calcitriol and sensipar  5. HTN/volume- BP better w/ vol coming down. Will dc hydralazine, cont  norvasc/ coreg/ clon/ losartan   [name] [place] pager [phone]   12/31/2016, 1:28 PM

## 2016-12-31 NOTE — Progress Notes (Signed)
Physical Therapy Treatment Patient Details Name: MAKIYAH ZENTZ MRN: [id number] DOB: 01-16-1966 Today's Date: 12/31/2016    History of Present Illness Pt is a 51 y/o female who presents s/p witnessed seizure at home with new L-sided weakness. No further seizure activity noted since admission. Imaging revealed R frontal lobe hemorrhage. Coded in hemodiaysis    PT Comments    Progressing slowly, pt always participative, but today more active with postural change.  Pt making wisecracks to family and therapist, which family says is "just like her".   Follow Up Recommendations  CIR;Supervision/Assistance - 24 hour     Equipment Recommendations  Wheelchair (measurements PT);Wheelchair cushion (measurements PT)    Recommendations for Other Services Rehab consult     Precautions / Restrictions Precautions Precautions: Fall Precaution Comments: Peritoneal dialysis patient Restrictions Weight Bearing Restrictions: No    Mobility  Bed Mobility               General bed mobility comments: Pt sitting OOB in recliner when therapy entered the room  Transfers Overall transfer level: Needs assistance Equipment used: 2 person hand held assist;Rolling walker (2 wheeled) Transfers: Sit to/from Stand Sit to Stand: Mod assist;+2 physical assistance         General transfer comment: verbal and tactile cues to assist Pt to edge of chair, blocking front and back of Left knee during transfers  Ambulation/Gait                 Stairs            Wheelchair Mobility    Modified Rankin (Stroke Patients Only)       Balance Overall balance assessment: Needs assistance Sitting-balance support: Single extremity supported;Feet supported Sitting balance-Leahy Scale: Poor Sitting balance - Comments: sat in recliner with improved posture due to support from arm rests and use of RUE to support. Pt educated in weight shifting and with vc able to bring self upright and to  midline. Pt able to sustain with min guard for minutes at a time. Overall for a period of about 20 min with periods of standing Postural control: Left lateral lean;Posterior lean Standing balance support: Bilateral upper extremity supported Standing balance-Leahy Scale: Zero Standing balance comment: stood x2 working on upright posture with head up and hips pelvis neutral, w/shift and balance.  Used unilateral device to see if this gave her supportive assist; Pt benefited from verbal and tactile cues for biomechanics to achieve midline and upright posture. Blocked left leg from hyperextension                            Cognition Arousal/Alertness: Awake/alert Behavior During Therapy: Flat affect Overall Cognitive Status: Within Functional Limits for tasks assessed                                        Exercises Other Exercises Other Exercises: PROM to hand, wrist, and elbow    General Comments General comments (skin integrity, edema, etc.): Pt with noted flexion in DIP this session. Pt would benefit from sling to prevent subluxation of shoulder      Pertinent Vitals/Pain Pain Assessment: No/denies pain    Home Living                      Prior Function  PT Goals (current goals can now be found in the care plan section) Acute Rehab PT Goals Patient Stated Goal: Pt did not state goals. PT Goal Formulation: Patient unable to participate in goal setting Time For Goal Achievement: 01/04/17 Potential to Achieve Goals: Fair Progress towards PT goals: Progressing toward goals    Frequency    Min 4X/week      PT Plan Current plan remains appropriate    Co-evaluation   Reason for Co-Treatment: Complexity of the patient's impairments (multi-system involvement) PT goals addressed during session: Mobility/safety with mobility OT goals addressed during session: ADL's and self-care      AM-PAC PT "6 Clicks" Daily Activity   Outcome Measure  Difficulty turning over in bed (including adjusting bedclothes, sheets and blankets)?: Total Difficulty moving from lying on back to sitting on the side of the bed? : Total Difficulty sitting down on and standing up from a chair with arms (e.g., wheelchair, bedside commode, etc,.)?: Total Help needed moving to and from a bed to chair (including a wheelchair)?: Total Help needed walking in hospital room?: Total Help needed climbing 3-5 steps with a railing? : Total 6 Click Score: 6    End of Session Equipment Utilized During Treatment: Gait belt Activity Tolerance: Patient tolerated treatment well Patient left: in chair;with call bell/phone within reach;with family/visitor present;with chair alarm set Nurse Communication: Mobility status PT Visit Diagnosis: Hemiplegia and hemiparesis Hemiplegia - Right/Left: Left Hemiplegia - dominant/non-dominant: Non-dominant Hemiplegia - caused by: Nontraumatic intracerebral hemorrhage     Time: 0981-1914 PT Time Calculation (min) (ACUTE ONLY): 31 min  Charges:  $Therapeutic Activity: 8-22 mins                    G Codes:       01-19-2017  Donnella Sham, PT [phone]  (pager)   Tessie Fass Derrich Gaby 01-19-17, 5:53 PM

## 2016-12-31 NOTE — Progress Notes (Signed)
Occupational Therapy Treatment Patient Details Name: Terry Juarez MRN: [id number] DOB: 1966/01/14 Today's Date: 12/31/2016    History of present illness Pt is a 51 y/o female who presents s/p witnessed seizure at home with new L-sided weakness. No further seizure activity noted since admission. Imaging revealed R frontal lobe hemorrhage. Coded in hemodiaysis   OT comments  Pt progressing towards OT goals this session. Focus was sitting and standing balance as precursor for ADL. Pt with noted movement in DIPs this session upon request. Pt still denies pain and continues to be motivated to work with therapy at intense level to gain as much function as possible and be as independent as possible. CIR still recommended, as Pt continues to be excellent candidate.  Follow Up Recommendations  CIR;Supervision/Assistance - 24 hour    Equipment Recommendations  Other (comment) (defer to next venue)    Recommendations for Other Services      Precautions / Restrictions Precautions Precautions: Fall Precaution Comments: Peritoneal dialysis patient Restrictions Weight Bearing Restrictions: No       Mobility Bed Mobility               General bed mobility comments: Pt sitting OOB in recliner when therapy entered the room  Transfers Overall transfer level: Needs assistance Equipment used: 2 person hand held assist;Rolling walker (2 wheeled) Transfers: Sit to/from Stand Sit to Stand: Mod assist;+2 physical assistance         General transfer comment: verbal and tactile cues to assist Pt to edge of chair, blocking front and back of Left knee during transfers    Balance Overall balance assessment: Needs assistance Sitting-balance support: Single extremity supported;Feet supported Sitting balance-Leahy Scale: Poor Sitting balance - Comments: sat in recliner with improved posture due to support from arm rests and use of RUE to support. Pt educated in weight shifting and with vc able  to bring self upright and to midline. Pt able to sustain with min guard for minutes at a time. Overall for a period of about 20 min with periods of standing Postural control: Left lateral lean;Posterior lean Standing balance support: Bilateral upper extremity supported Standing balance-Leahy Scale: Zero Standing balance comment: stood x2 working on upright posture with head up and hips pelvis neutral, w/shift and balance.  Used unilateral device to see if this gave her supportive assist; Pt benefited from verbal and tactile cues for biomechanics to achieve midline and upright posture. Blocked left leg from hyperextension                           ADL either performed or assessed with clinical judgement   ADL Overall ADL's : Needs assistance/impaired                         Toilet Transfer: Maximal assistance;+2 for physical assistance;+2 for safety/equipment Toilet Transfer Details (indicate cue type and reason): simulated this session with sit to stand from [place] and Hygiene: Total assistance;Sit to/from stand       Functional mobility during ADLs:  (not attempted this session)       Vision   Vision Assessment?: Vision impaired- to be further tested in functional context   Perception     Praxis      Cognition Arousal/Alertness: Awake/alert Behavior During Therapy: Flat affect Overall Cognitive Status: Within Functional Limits for tasks assessed  Exercises Other Exercises Other Exercises: PROM to hand, wrist, and elbow   Shoulder Instructions       General Comments Pt with noted flexion in DIP this session. Pt would benefit from sling to prevent subluxation of shoulder    Pertinent Vitals/ Pain       Pain Assessment: No/denies pain  Home Living                                          Prior Functioning/Environment               Frequency  Min 3X/week        Progress Toward Goals  OT Goals(current goals can now be found in the care plan section)  Progress towards OT goals: Progressing toward goals  Acute Rehab OT Goals Patient Stated Goal: Pt did not state goals.  Plan Discharge plan remains appropriate    Co-evaluation    PT/OT/SLP Co-Evaluation/Treatment: Yes Reason for Co-Treatment: Complexity of the patient's impairments (multi-system involvement) PT goals addressed during session: Mobility/safety with mobility OT goals addressed during session: ADL's and self-care      AM-PAC PT "6 Clicks" Daily Activity     Outcome Measure   Help from another person eating meals?: A Lot Help from another person taking care of personal grooming?: A Lot Help from another person toileting, which includes using toliet, bedpan, or urinal?: Total Help from another person bathing (including washing, rinsing, drying)?: A Lot Help from another person to put on and taking off regular upper body clothing?: Total Help from another person to put on and taking off regular lower body clothing?: Total 6 Click Score: 9    End of Session Equipment Utilized During Treatment: Gait belt;Rolling walker  OT Visit Diagnosis: Hemiplegia and hemiparesis;Other symptoms and signs involving cognitive function Hemiplegia - Right/Left: Left Hemiplegia - dominant/non-dominant: Non-Dominant Hemiplegia - caused by: Nontraumatic intracerebral hemorrhage   Activity Tolerance Patient tolerated treatment well   Patient Left in chair;with call bell/phone within reach;with chair alarm set;with family/visitor present   Nurse Communication Mobility status;Need for lift equipment        Time: 5248-1859 OT Time Calculation (min): 29 min  Charges: OT General Charges $OT Visit: 1 Procedure OT Treatments $Therapeutic Activity: 8-22 mins  Hulda Humphrey OTR/L [place] 12/31/2016, 5:46 PM

## 2016-12-31 NOTE — Progress Notes (Signed)
Inpatient Rehabilitation  Patient's family and IP Rehab team have questions about potential for and anticipated timing of patient's medical readiness for HD transition repeat trial.  This clarification is needed to assist with discharge planning for post acute therapies.  I await call back and input from Nephrology.  Please call with questions.   Carmelia Roller., CCC/SLP Admission Coordinator  [place]  Cell [phone]

## 2016-12-31 NOTE — Progress Notes (Signed)
PROGRESS NOTE                                                                                                                                                                                                             Patient Demographics:    Terry Juarez, is a 51 y.o. female, DOB - 12/17/1965, CWC:[id number]  Admit date - 12/19/2016   Admitting Physician Reyne Dumas, MD  Outpatient Primary MD for the patient is Tisovec, Fransico Him, MD  LOS - 12    Chief Complaint  Patient presents with  . Seizures       Brief Narrative   51 year old female with ESRD on peritoneal dialysis, renal cell carcinoma status post right nephrectomy, hypertension, hyperlipidemia and anemia was found down on the floor actively seizing by her roommate. She was brought to the ED with symptoms of right frontal headache and acute left-sided weakness. Head CT showed large 29 mm right frontal hemorrhage with surrounding edema and mass effect along with small subarachnoid hemorrhages and signs of PRES. CT angiogram head and neck showed no AVMs but with increase in size of the hematoma. Patient to my neurology in the ED and started on nicardipine and  Keppra. MRI of the brain showed no change in the size of right frontal hematoma and also conformed PRES.  Last head CT on 6/21 showed stable right frontal hemorrhage with evolving surrounding edema and stable 3-4 mm midline shift.  Patient transferred to hospitalist service on 6/24.   Subjective:   deneis any pain, more interactive.   Assessment  & Plan :    Active Problems:   Intracranial hemorrhage (HCC) Right posterior frontal lobe acute hemorrhage with surrounding edema and local mass effect.  CT and MRI findings suggestive of PRES in the setting of hypertension. Also has subarachnoid hemorrhage. No vascular explanation for right frontoparietal hematoma. Has  residual left hemiparesis. 2-D  echo without cardiac source of emboli. EEG shows diffuse slowing. No antithrombotic or anticoagulation due to hemorrhage. Appreciate neurology's recommendation. Aggressive stroke risk factor management. Patient does not qualify for CIR as she does not have enough care at home upon discharge. (lives with her father who is wheelchair bound) Follow-up with Dr Erlinda Hong in 6 weeks.  Acute seizures Likely triggered by intracranial hemorrhage. Continue empiric Keppra. EEG shows diffuse slowing.  PRES (posterior reversible encephalopathy syndrome) with hypertensive emergency SBP goal less than 160. Currently on 5 different blood pressure medications well controlled. Titrate as tolerated.  Cardiac arrest on 6/22 Short cardiac arrest of 30 seconds while at hemodialysis on 6/22. Plan on no further attempt to hemodialysis due to labile blood pressure.  Acute respiratory failure with hypoxia Multifactorial secondary to hypertensive emergency, seizures and ICH Currently stable on room air.  ESRD on PD Not tolerating hemodialysis. Renal consult appreciated. Resumed Sensipar, calcitriol and PhosLo. Refused to be on HD in future.  History of renal cell carcinoma status post right nephrectomy  Polycythemia Etiology unclear. Monitor for now. Prior history of GI bleed with anemia, has responded well to ESA.   Constipation Resolved with enema. Order daily MiraLAX.    Code Status : Full code  Family Communication  : None at bedside  Disposition Plan  :   CIR vs LTAC  Barriers For Discharge : Pending placement  Consults  :   Neurology Nephrology CIR   Procedures  :  Hemodialysis CT head CT angiogram head and neck MRI brain  DVT Prophylaxis  : SCDs  Lab Results  Component Value Date   PLT 218 12/26/2016    Antibiotics  :   Anti-infectives    None        Objective:   Vitals:   12/31/16 0530 12/31/16 0600 12/31/16 0830 12/31/16 1400  BP: 140/78  130/64 (!) 149/64    Pulse: 74  71 75  Resp: 18  18 18   Temp: 98.5 F (36.9 C)  98.4 F (36.9 C) 98 F (36.7 C)  TempSrc: Oral  Oral Oral  SpO2: 97%   94%  Weight:  59.8 kg (131 lb 13.4 oz)    Height:        Wt Readings from Last 3 Encounters:  12/31/16 59.8 kg (131 lb 13.4 oz)  09/04/16 54.4 kg (120 lb)  08/02/16 56.9 kg (125 lb 7.1 oz)     Intake/Output Summary (Last 24 hours) at 12/31/16 1645 Last data filed at 12/31/16 1300  Gross per 24 hour  Intake            10730 ml  Output            10719 ml  Net               11 ml     Physical Exam Gen.: Not in distress HEENT: Moist mucosa, supple neck Chest: Clear bilaterally CVS: Normal S1 and S2, no murmurs GI: Soft, nondistended, nontender, peritoneal dialysis catheter + Musculoskeletal: Warm, no edema CNS: Alert and oriented, more interactive, left hemiparesis     Data Review:    CBC  Recent Labs Lab 12/25/16 0245 12/26/16 0443  WBC 10.6* 10.3  HGB 18.7* 17.6*  HCT 55.9* 55.7*  PLT 159 218  MCV 98.9 102.6*  MCH 33.1 32.4  MCHC 33.5 31.6  RDW 15.4 15.8*    Chemistries   Recent Labs Lab 12/26/16 0443 12/27/16 0248 12/28/16 0317 12/29/16 0532 12/30/16 0525 12/31/16 0256  NA 137 137 136 137 134* 136  K 4.4 4.7 4.4 3.8 4.5 3.7  CL 99* 99* 100* 99* 98* 98*  CO2 21* 22 22 24 24 25   GLUCOSE 113* 116* 113* 124* 114* 119*  BUN 59* 60* 54* 52* 53* 49*  CREATININE 11.08* 11.78* 10.86* 10.91* 10.92* 10.53*  CALCIUM 8.9 9.1 9.5 9.5 9.7 10.0  MG 2.2  --  2.0 1.9 1.9 1.9   ------------------------------------------------------------------------------------------------------------------  Recent Labs  12/31/16 0256  TRIG 150*    No results found for: HGBA1C ------------------------------------------------------------------------------------------------------------------ No results for input(s): TSH, T4TOTAL, T3FREE, THYROIDAB in the last 72 hours.  Invalid input(s):  FREET3 ------------------------------------------------------------------------------------------------------------------ No results for input(s): VITAMINB12, FOLATE, FERRITIN, TIBC, IRON, RETICCTPCT in the last 72 hours.  Coagulation profile No results for input(s): INR, PROTIME in the last 168 hours.  No results for input(s): DDIMER in the last 72 hours.  Cardiac Enzymes No results for input(s): CKMB, TROPONINI, MYOGLOBIN in the last 168 hours.  Invalid input(s): CK ------------------------------------------------------------------------------------------------------------------ No results found for: BNP  Inpatient Medications  Scheduled Meds: . amLODipine  10 mg Oral Daily  . calcitRIOL  0.25 mcg Oral Q M,W,F-1800  . calcium acetate  1,334 mg Oral TID WC  . carvedilol  12.5 mg Oral BID WC  . cinacalcet  60 mg Oral Q supper  . cloNIDine  0.1 mg Oral TID  . famotidine  20 mg Oral Daily  . gentamicin cream  1 application Topical Daily  . hydrALAZINE  25 mg Oral Q8H  . levETIRAcetam  500 mg Oral BID  . levothyroxine  88 mcg Oral QAC breakfast  . losartan  100 mg Oral QHS  . mouth rinse  15 mL Mouth Rinse BID  . multivitamin  1 tablet Oral Daily  . pantoprazole  40 mg Oral BID  . polyethylene glycol  17 g Oral Daily  . senna-docusate  1 tablet Oral BID   Continuous Infusions:  PRN Meds:.acetaminophen, dianeal solution for CAPD/CCPD with heparin, dianeal solution for CAPD/CCPD with heparin, dianeal solution for CAPD/CCPD with heparin, hydrALAZINE  Micro Results No results found for this or any previous visit (from the past 240 hour(s)).  Radiology Reports Ct Angio Head W Or Wo Contrast  Result Date: 12/19/2016 CLINICAL DATA:  Intraparenchymal and subarachnoid hemorrhage. EXAM: CT ANGIOGRAPHY HEAD AND NECK TECHNIQUE: Multidetector CT imaging of the head and neck was performed using the standard protocol during bolus administration of intravenous contrast. Multiplanar CT  image reconstructions and MIPs were obtained to evaluate the vascular anatomy. Carotid stenosis measurements (when applicable) are obtained utilizing NASCET criteria, using the distal internal carotid diameter as the denominator. CONTRAST:  50 cc Isovue 370 intravenous COMPARISON:  Noncontrast head CT from earlier today FINDINGS: CTA NECK FINDINGS Aortic arch: Atheromatous wall thickening that is mild. No acute finding or aneurysm. Right carotid system: Mild atheromatous plaque at the common carotid bifurcation without stenosis or ulceration. Beaded ICA consistent with fibromuscular dysplasia. No superimposed acute dissection. Left carotid system: Scattered atheromatous plaque on the distal common carotid and proximal ICA, moderate for age. No flow limiting stenosis or ulceration. Beading of the left ICA consistent with fibromuscular dysplasia. No superimposed dissection. Vertebral arteries: Proximal subclavian atherosclerosis without flow limiting stenosis. Both vertebral arteries are smooth and widely patent to the dura. Left vertebral dominance. Skeleton: No acute or aggressive finding. Other neck: Negative for mass or inflammation. Upper chest: Negative Review of the MIP images confirms the above findings CTA HEAD FINDINGS Anterior circulation: Moderate calcified plaque on the carotid siphons without flow limiting stenosis. Standard branching. There is no evidence of aneurysm or vascular malformation. No major branch occlusion or proximal flow limiting stenosis. Posterior circulation: Left dominant vertebral artery. The vertebral and basilar arteries are smooth and widely patent. Aplastic right and mildly hypoplastic left P1 segments with large posterior communicating arteries. No branch occlusion, stenosis, aneurysm, or evidence of vascular malformation. Venous sinuses: Patent on the delayed phase. Anatomic variants:  Fetal type PCA circulation, especially on the right. Delayed phase: No masslike enhancement.  Pending brain MRI. The right frontal parietal hematoma is larger than before, measuring 27 x 34 x 35 mm as compared to 20 x 31 x 28 mm. Local subarachnoid hemorrhage and mild cerebral edema without shift or herniation. Review of the MIP images confirms the above findings IMPRESSION: 1. No vascular explanation for the right frontal parietal hematoma. Dural venous sinuses are patent and there is no evidence of vascular malformation. 2. The 16 cc hematoma is mildly increased from earlier CT. Negative for shift or herniation. 3. Fibromuscular dysplasia of the bilateral cervical ICA. 4. Moderate atherosclerosis for age. Negative for flow limiting stenosis. Electronically Signed   By: Monte Fantasia M.D.   On: 12/19/2016 19:37   Ct Head Wo Contrast  Result Date: 12/24/2016 CLINICAL DATA:  Intraparenchymal hemorrhage. EXAM: CT HEAD WITHOUT CONTRAST TECHNIQUE: Contiguous axial images were obtained from the base of the skull through the vertex without intravenous contrast. COMPARISON:  Multiple prior head CTs, most recently 12/22/2016 FINDINGS: Brain: The right frontal hemorrhage is stable in size. Evolving surrounding edema is noted. 3-4 mm of midline shift is not significantly changed. No new hemorrhage is present. The subarachnoid hemorrhage is not significantly changed. Vascular: Atherosclerotic calcifications are again noted. Skull: Calvarium is intact. Sinuses/Orbits: The paranasal sinuses and mastoid air cells are clear. IMPRESSION: 1. Stable right frontal hemorrhage with evolving surrounding edema. 2. Stable 3-4 mm of midline shift. Electronically Signed   By: San Morelle M.D.   On: 12/24/2016 12:22   Ct Head Wo Contrast  Result Date: 12/22/2016 CLINICAL DATA:  Patient admitted for [place], stroke. MRI demonstrated PRES. EXAM: CT HEAD WITHOUT CONTRAST TECHNIQUE: Contiguous axial images were obtained from the base of the skull through the vertex without intravenous contrast. COMPARISON:  Multiple priors,  most recent earlier today at 3:55 a.m. FINDINGS: Brain: Redemonstrated is an intraparenchymal hemorrhage, not significantly changed in size or shape, roughly 4 x 3 x 5 cm. Mild surrounding vasogenic edema is not increased. Slight effacement RIGHT lateral ventricle. Unchanged midline shift RIGHT to LEFT, roughly 3 mm. No hydrocephalus or ventricular trapping. Effacement of the basilar cisterns redemonstrated. Vascular: No hyperdense vessel or unexpected calcification. Skull: Normal. Negative for fracture or focal lesion. Sinuses/Orbits: No acute finding. Other: None. IMPRESSION: Stable intraparenchymal hemorrhage. Subtle signs of midline shift, and slight increased pressure. But no significant worsening from priors. Electronically Signed   By: Staci Righter M.D.   On: 12/22/2016 09:47   Ct Head Wo Contrast  Result Date: 12/22/2016 CLINICAL DATA:  Follow-up examination for acute intracranial hemorrhage. EXAM: CT HEAD WITHOUT CONTRAST TECHNIQUE: Contiguous axial images were obtained from the base of the skull through the vertex without intravenous contrast. COMPARISON:  Prior CT from 12/21/2016. FINDINGS: Brain: Intraparenchymal hemorrhage centered at the high right frontal parietal region again seen. Hemorrhage overall not significantly changed measuring 4.1 x 3.3 x 5.4 cm seen greatest dimensions (previously 4.1 x 3.4 x 5.5 cm). Surrounding low-density vasogenic edema relatively with sulcal effacement relatively similar. Mass effect on the right lateral ventricle which is partially effaced relatively unchanged. Right-to-left midline shift measures approximately 3 mm, similar. No hydrocephalus or ventricular trapping. No intraventricular extension of hemorrhage. Adjacent small volume subarachnoid hemorrhage noted, similar. Adjacent extra-axial hemorrhage unchanged. No new acute intracranial hemorrhage. No evidence for acute large vessel territory infarct. Vascular: No hyperdense vessel. Scattered vascular  calcifications noted within the carotid siphons. Skull: Scalp soft tissues and calvarium unchanged,  and remain within normal limits. Sinuses/Orbits: Globes and orbital soft tissues within normal limits. Paranasal sinuses and mastoid air cells remain clear. IMPRESSION: 1. No significant interval change in posterior right frontal intraparenchymal hematoma with extra-axial extension. Similar localized edema and mass effect with 3 mm right-to-left shift. 2. No other new acute intracranial process. Electronically Signed   By: Jeannine Boga M.D.   On: 12/22/2016 04:49   Ct Head Wo Contrast  Result Date: 12/21/2016 CLINICAL DATA:  Intracranial hemorrhage. EXAM: CT HEAD WITHOUT CONTRAST TECHNIQUE: Contiguous axial images were obtained from the base of the skull through the vertex without intravenous contrast. COMPARISON:  CT head without contrast 12/20/2016 and 12/19/2016. FINDINGS: Brain: The focal hyperdense hemorrhage within the high right frontal lobe extends to the cortex. The area of hemorrhage continues to slowly increase in size. Maximal dimensions on coronal and sagittal reformats are now 4.1 x 3.4 by 5.5 cm. This compares with previous measurements of 3.8 x 8 3.3 x 5.2 cm and is significantly larger than the regional measurements. Adjacent extra-axial hemorrhage is similar the prior study. Mild edema surrounds the hemorrhage. Local mass effect is present with effacement of the sulci. Minimal midline shift is present. There is increasing effacement of the right lateral ventricle. Vascular: Atherosclerotic calcifications are present within the cavernous internal carotid arteries. There is no hyperdense vessel. Skull: The calvarium is intact. No focal lytic or blastic lesions are present. Sinuses/Orbits: The paranasal sinuses and mastoid air cells are clear. The globes and orbits are unremarkable. IMPRESSION: 1. Continued increase in size of right frontal parenchymal hemorrhage with extra-axial extension.  2. Slight increase in associated mass effect. These results were called by telephone at the time of interpretation on 12/21/2016 at 9:21 am to Dr. Rosalin Hawking , who verbally acknowledged these results. Electronically Signed   By: San Morelle M.D.   On: 12/21/2016 09:29   Ct Head Wo Contrast  Result Date: 12/20/2016 CLINICAL DATA:  Intracranial hemorrhage follow up EXAM: CT HEAD WITHOUT CONTRAST TECHNIQUE: Contiguous axial images were obtained from the base of the skull through the vertex without intravenous contrast. COMPARISON:  Brain MRI 12/19/2016 Head CT 12/19/2016 Head CTA 12/19/2016 FINDINGS: Brain: Intraparenchymal hematoma centered in the right frontal lobe now measures 3.8 x 3.4 x 3.8 cm, previously 3.7 x 3.8 by 3.0 cm. Surrounding edema is unchanged. There is subarachnoid blood over the right convexity, also unchanged. There is no midline shift. No hydrocephalus. Basal cisterns remain patent. Vascular: No hyperdense vessel or unexpected calcification. Skull: Normal visualized skull base, calvarium and extracranial soft tissues. Sinuses/Orbits: No sinus fluid levels or advanced mucosal thickening. No mastoid effusion. Normal orbits. IMPRESSION: 1. Slightly increased size of right frontal intraparenchymal hematoma with unchanged right convexity subarachnoid blood, compared to head CTA performed 12/19/2016. Compared to the initial head CT from this presentation, obtained at 5:02 p.m. on 12/19/2016, the hematoma has increased in size. 2. No new hemorrhage, hydrocephalus or mass effect. Electronically Signed   By: Ulyses Jarred M.D.   On: 12/20/2016 06:29   Ct Head Wo Contrast  Result Date: 12/19/2016 CLINICAL DATA:  51 y/o  F; seizure. EXAM: CT HEAD WITHOUT CONTRAST CT CERVICAL SPINE WITHOUT CONTRAST TECHNIQUE: Multidetector CT imaging of the head and cervical spine was performed following the standard protocol without intravenous contrast. Multiplanar CT image reconstructions of the cervical  spine were also generated. COMPARISON:  02/11/2015 CT of the head. FINDINGS: CT HEAD FINDINGS Brain: Right posterior frontal lobe acute hemorrhage measuring 22 x 29  x 21 mm (AP x ML x CC series 3 image 26 and series 5, image 34). Small volume of subarachnoid hemorrhage with overlying the right frontal convexity and along the falx. There is a small surrounding region of hypoattenuation in the brain compatible with edema with local mass effect effacing sulci. No significant midline shift. No evidence for herniation. No large territory acute stroke identified. There are foci of hypoattenuation within subcortical white matter in the bilateral parietal lobes and occipital lobes. The distribution is suggestive of PRES. Vascular: Extensive calcific atherosclerosis of the carotid siphons. Skull: Normal. Negative for fracture or focal lesion. Sinuses/Orbits: No acute finding. Other: None. CT CERVICAL SPINE FINDINGS Alignment: Normal. Skull base and vertebrae: No acute fracture. No primary bone lesion or focal pathologic process. Soft tissues and spinal canal: No prevertebral fluid or swelling. No visible canal hematoma. Disc levels: Straightening of cervical lordosis and discogenic degenerative changes with mild disc space narrowing and marginal osteophytes from the C3 through C5 levels. Upper chest: Negative. Other: Negative appear IMPRESSION: 1. Right posterior frontal lobe acute hemorrhage measuring up to 29 mm with small area of surrounding edema and local mass effect. No significant midline shift or herniation. Follow-up to ensure resolution is recommended to exclude an underlying mass. 2. Small volume of subarachnoid hemorrhage overlying the right frontal convexity. 3. Hypoattenuation within subcortical white matter and bilateral parietal and occipital lobes. Findings are suggestive of PRES in the setting of hypertension. This can be further characterized with MRI of the brain. 4. No acute fracture or dislocation of  the cervical spine. These results were called by telephone at the time of interpretation on 12/19/2016 at 5:12 pm to Dr. Lajean Saver , who verbally acknowledged these results. Electronically Signed   By: Kristine Garbe M.D.   On: 12/19/2016 17:23   Ct Angio Neck W And/or Wo Contrast  Result Date: 12/19/2016 CLINICAL DATA:  Intraparenchymal and subarachnoid hemorrhage. EXAM: CT ANGIOGRAPHY HEAD AND NECK TECHNIQUE: Multidetector CT imaging of the head and neck was performed using the standard protocol during bolus administration of intravenous contrast. Multiplanar CT image reconstructions and MIPs were obtained to evaluate the vascular anatomy. Carotid stenosis measurements (when applicable) are obtained utilizing NASCET criteria, using the distal internal carotid diameter as the denominator. CONTRAST:  50 cc Isovue 370 intravenous COMPARISON:  Noncontrast head CT from earlier today FINDINGS: CTA NECK FINDINGS Aortic arch: Atheromatous wall thickening that is mild. No acute finding or aneurysm. Right carotid system: Mild atheromatous plaque at the common carotid bifurcation without stenosis or ulceration. Beaded ICA consistent with fibromuscular dysplasia. No superimposed acute dissection. Left carotid system: Scattered atheromatous plaque on the distal common carotid and proximal ICA, moderate for age. No flow limiting stenosis or ulceration. Beading of the left ICA consistent with fibromuscular dysplasia. No superimposed dissection. Vertebral arteries: Proximal subclavian atherosclerosis without flow limiting stenosis. Both vertebral arteries are smooth and widely patent to the dura. Left vertebral dominance. Skeleton: No acute or aggressive finding. Other neck: Negative for mass or inflammation. Upper chest: Negative Review of the MIP images confirms the above findings CTA HEAD FINDINGS Anterior circulation: Moderate calcified plaque on the carotid siphons without flow limiting stenosis. Standard  branching. There is no evidence of aneurysm or vascular malformation. No major branch occlusion or proximal flow limiting stenosis. Posterior circulation: Left dominant vertebral artery. The vertebral and basilar arteries are smooth and widely patent. Aplastic right and mildly hypoplastic left P1 segments with large posterior communicating arteries. No branch occlusion, stenosis, aneurysm,  or evidence of vascular malformation. Venous sinuses: Patent on the delayed phase. Anatomic variants: Fetal type PCA circulation, especially on the right. Delayed phase: No masslike enhancement. Pending brain MRI. The right frontal parietal hematoma is larger than before, measuring 27 x 34 x 35 mm as compared to 20 x 31 x 28 mm. Local subarachnoid hemorrhage and mild cerebral edema without shift or herniation. Review of the MIP images confirms the above findings IMPRESSION: 1. No vascular explanation for the right frontal parietal hematoma. Dural venous sinuses are patent and there is no evidence of vascular malformation. 2. The 16 cc hematoma is mildly increased from earlier CT. Negative for shift or herniation. 3. Fibromuscular dysplasia of the bilateral cervical ICA. 4. Moderate atherosclerosis for age. Negative for flow limiting stenosis. Electronically Signed   By: Monte Fantasia M.D.   On: 12/19/2016 19:37   Ct Cervical Spine Wo Contrast  Result Date: 12/19/2016 CLINICAL DATA:  51 y/o  F; seizure. EXAM: CT HEAD WITHOUT CONTRAST CT CERVICAL SPINE WITHOUT CONTRAST TECHNIQUE: Multidetector CT imaging of the head and cervical spine was performed following the standard protocol without intravenous contrast. Multiplanar CT image reconstructions of the cervical spine were also generated. COMPARISON:  02/11/2015 CT of the head. FINDINGS: CT HEAD FINDINGS Brain: Right posterior frontal lobe acute hemorrhage measuring 22 x 29 x 21 mm (AP x ML x CC series 3 image 26 and series 5, image 34). Small volume of subarachnoid hemorrhage  with overlying the right frontal convexity and along the falx. There is a small surrounding region of hypoattenuation in the brain compatible with edema with local mass effect effacing sulci. No significant midline shift. No evidence for herniation. No large territory acute stroke identified. There are foci of hypoattenuation within subcortical white matter in the bilateral parietal lobes and occipital lobes. The distribution is suggestive of PRES. Vascular: Extensive calcific atherosclerosis of the carotid siphons. Skull: Normal. Negative for fracture or focal lesion. Sinuses/Orbits: No acute finding. Other: None. CT CERVICAL SPINE FINDINGS Alignment: Normal. Skull base and vertebrae: No acute fracture. No primary bone lesion or focal pathologic process. Soft tissues and spinal canal: No prevertebral fluid or swelling. No visible canal hematoma. Disc levels: Straightening of cervical lordosis and discogenic degenerative changes with mild disc space narrowing and marginal osteophytes from the C3 through C5 levels. Upper chest: Negative. Other: Negative appear IMPRESSION: 1. Right posterior frontal lobe acute hemorrhage measuring up to 29 mm with small area of surrounding edema and local mass effect. No significant midline shift or herniation. Follow-up to ensure resolution is recommended to exclude an underlying mass. 2. Small volume of subarachnoid hemorrhage overlying the right frontal convexity. 3. Hypoattenuation within subcortical white matter and bilateral parietal and occipital lobes. Findings are suggestive of PRES in the setting of hypertension. This can be further characterized with MRI of the brain. 4. No acute fracture or dislocation of the cervical spine. These results were called by telephone at the time of interpretation on 12/19/2016 at 5:12 pm to Dr. Lajean Saver , who verbally acknowledged these results. Electronically Signed   By: Kristine Garbe M.D.   On: 12/19/2016 17:23   Mr Brain Wo  Contrast  Result Date: 12/19/2016 CLINICAL DATA:  Seizure and altered mental status. EXAM: MRI HEAD WITHOUT CONTRAST TECHNIQUE: Multiplanar, multiecho pulse sequences of the brain and surrounding structures were obtained without intravenous contrast. COMPARISON:  CTA head neck from earlier the same day FINDINGS: Brain: Acute parenchymal hematoma in the posterior right frontal lobe is  size stable from CT 60 minutes prior. There is subarachnoid FLAIR signal hyperintensity along the right more than left convexity. On the right there is associated susceptibility on gradient imaging correlating with subarachnoid hemorrhage by CT. There is bilateral cortical and subcortical T2 hyperintensity within the predominantly high and posterior cerebral convexities, also seen along the left putamen. No hydrocephalus. No primary infarct findings. Vascular: Major flow voids are preserved. Skull and upper cervical spine: Negative for marrow lesion Sinuses/Orbits: Negative Other: These results were called by telephone at the time of interpretation on 12/19/2016 at 8:30 pm to Dr. Lajean Saver , who verbally acknowledged these results. IMPRESSION: 1. Findings of posterior reversible encephalopathy syndrome. 2. Size stable posterior right frontal hematoma compared to CTA 1 hour prior. Electronically Signed   By: Monte Fantasia M.D.   On: 12/19/2016 20:35   Dg Chest Port 1 View  Result Date: 12/25/2016 CLINICAL DATA:  Intubation. EXAM: PORTABLE CHEST 1 VIEW COMPARISON:  07/29/2016 . FINDINGS: Endotracheal tube noted with its tip 4.1 cm above the carina. Heart size normal. No focal infiltrate. Prominent nipple shadows again noted. No pleural effusion or pneumothorax. IMPRESSION: 1.  Endotracheal tube tip noted 4.1 cm above the carina. 2. No acute cardiopulmonary disease. Electronically Signed   By: Marcello Moores  Register   On: 12/25/2016 07:14   Dg Swallowing Func-speech Pathology  Result Date: 12/20/2016 Objective Swallowing  Evaluation: Type of Study: MBS-Modified Barium Swallow Study Patient Details Name: KAMYLAH MANZO MRN: [id number] Date of Birth: 18-Mar-1966 Today's Date: 12/20/2016 Time: SLP Start Time (ACUTE ONLY): 0914-SLP Stop Time (ACUTE ONLY): 2952 SLP Time Calculation (min) (ACUTE ONLY): 13 min Past Medical History: Past Medical History: Diagnosis Date . Anemia  . Bruises easily  . Dialysis patient ([place])  . ESRD on dialysis ([place])  . Hyperlipidemia  . Hypertension  . Renal disorder   rt renal mass / < functioning of left kidney - being prepared for possible dialysis . Right renal mass  Past Surgical History: Past Surgical History: Procedure Laterality Date . AV FISTULA PLACEMENT Right 07/11/2014  Procedure: ARTERIOVENOUS (AV) FISTULA CREATION RIGHT ARM BRACHIO-CEPHALIC WITH ATTEMPTED RADIO-CEPHALIC (AV) FISTULA;  Surgeon: Mal Misty, MD;  Location: [facility];  Service: Vascular;  Laterality: Right; . COLONOSCOPY WITH PROPOFOL N/A 09/04/2016  Procedure: COLONOSCOPY WITH PROPOFOL;  Surgeon: Carol Ada, MD;  Location: [facility];  Service: Endoscopy;  Laterality: N/A; . ECTOPIC PREGNANCY SURGERY  1987 . ESOPHAGOGASTRODUODENOSCOPY N/A 07/30/2016  Procedure: ESOPHAGOGASTRODUODENOSCOPY (EGD);  Surgeon: Carol Ada, MD;  Location: [facility] ENDOSCOPY;  Service: Endoscopy;  Laterality: N/A;  Bedside . ESOPHAGOGASTRODUODENOSCOPY (EGD) WITH PROPOFOL N/A 09/04/2016  Procedure: ESOPHAGOGASTRODUODENOSCOPY (EGD) WITH PROPOFOL;  Surgeon: Carol Ada, MD;  Location: [facility];  Service: Endoscopy;  Laterality: N/A; . INSERTION OF DIALYSIS CATHETER Right 07/11/2014  Procedure: INSERTION OF DIALYSIS CATHETER IN RIGHT INTERNAL JUGULAR ;  Surgeon: Mal Misty, MD;  Location: [facility];  Service: Vascular;  Laterality: Right; . LAPAROSCOPIC NEPHRECTOMY Right 07/25/2014  Procedure: RIGHT LAPAROSCOPIC RADICAL NEPHRECTOMY ;  Surgeon: Ardis Hughs, MD;  Location: [facility];  Service: Urology;  Laterality: Right; . PATCH ANGIOPLASTY Right 07/11/2014  Procedure:  PATCH ANGIOPLASTY OF RIGHT RADIAL ARTERY USING CEPHALIC VEIN.;  Surgeon: Mal Misty, MD;  Location: [facility];  Service: Vascular;  Laterality: Right; . THROMBECTOMY W/ EMBOLECTOMY Right 07/11/2014  Procedure: THROMBECTOMY OF RIGHT RADIAL ARTERY  ;  Surgeon: Mal Misty, MD;  Location: [facility] OR;  Service: Vascular;  Laterality: Right; HPI: 51 yo F with history  of ESRD on Peritoneal Dialysis, HTN, Right renal mass, and Anemia, who was found down on the floor seizing. She was found to have a large right frontal hemorrhage with surrounding edema and mass effect, also with small SAH and signs of PRES. Pt describes subtle dysphagia and vocal quality changes following thyroid surgery in April. Subjective: pt alert, eager for POs, particularly before she starts dialysis Assessment / Plan / Recommendation CHL IP CLINICAL IMPRESSIONS 12/20/2016 Clinical Impression Pt's oropharyngeal swallow appears to be [facility], with no aspiration or penetration observed. Of note, she did not appear to have as many audible swallows or throat clearings during MBS as was observed at bedside, but when she did have throat clearing it was no associated with any airway compromise. She fairly consistently completed second swallows, particularly with thin liquids, but there were only trace residuals (mostly a small coating of the tongue/base of tongue) remaining throughout her oropharynx. Recommend that pt initiate regular textures and thin liquids. SLP f/u not needed for swallowing, but recommend MD order SLP cognitive-linguistic evaluation. SLP Visit Diagnosis Dysphagia, unspecified (R13.10) Attention and concentration deficit following -- Frontal lobe and executive function deficit following -- Impact on safety and function Mild aspiration risk   CH[facility] IP TREATMENT RECOMMENDATION 12/20/2016 Treatment Recommendations No treatment recommended at this time   Prognosis 12/20/2016 Prognosis for Safe Diet Advancement Good Barriers to Reach Goals --  Barriers/Prognosis Comment -- [facility] IP DIET RECOMMENDATION 12/20/2016 SLP Diet Recommendations Regular solids;Thin liquid Liquid Administration via Cup;Straw Medication Administration Whole meds with liquid Compensations Slow rate;Small sips/bites Postural Changes Remain semi-upright after after feeds/meals (Comment);Seated upright at 90 degrees   CHL IP OTHER RECOMMENDATIONS 12/20/2016 Recommended Consults -- Oral Care Recommendations Oral care BID Other Recommendations --   CH[facility] IP FOLLOW UP RECOMMENDATIONS 12/20/2016 Follow up Recommendations Other (comment)   No flowsheet data found.     CHL IP ORAL PHASE 12/20/2016 Oral Phase WFL Oral - Pudding Teaspoon -- Oral - Pudding Cup -- Oral - Honey Teaspoon -- Oral - Honey Cup -- Oral - Nectar Teaspoon -- Oral - Nectar Cup -- Oral - Nectar Straw -- Oral - Thin Teaspoon -- Oral - Thin Cup -- Oral - Thin Straw -- Oral - Puree -- Oral - Mech Soft -- Oral - Regular -- Oral - Multi-Consistency -- Oral - Pill -- Oral Phase - Comment --  CHL IP PHARYNGEAL PHASE 12/20/2016 Pharyngeal Phase WFL Pharyngeal- Pudding Teaspoon -- Pharyngeal -- Pharyngeal- Pudding Cup -- Pharyngeal -- Pharyngeal- Honey Teaspoon -- Pharyngeal -- Pharyngeal- Honey Cup -- Pharyngeal -- Pharyngeal- Nectar Teaspoon -- Pharyngeal -- Pharyngeal- Nectar Cup -- Pharyngeal -- Pharyngeal- Nectar Straw -- Pharyngeal -- Pharyngeal- Thin Teaspoon -- Pharyngeal -- Pharyngeal- Thin Cup -- Pharyngeal -- Pharyngeal- Thin Straw -- Pharyngeal -- Pharyngeal- Puree -- Pharyngeal -- Pharyngeal- Mechanical Soft -- Pharyngeal -- Pharyngeal- Regular -- Pharyngeal -- Pharyngeal- Multi-consistency -- Pharyngeal -- Pharyngeal- Pill -- Pharyngeal -- Pharyngeal Comment --  CHL IP CERVICAL ESOPHAGEAL PHASE 12/20/2016 Cervical Esophageal Phase WFL Pudding Teaspoon -- Pudding Cup -- Honey Teaspoon -- Honey Cup -- Nectar Teaspoon -- Nectar Cup -- Nectar Straw -- Thin Teaspoon -- Thin Cup -- Thin Straw -- Puree -- Mechanical Soft -- Regular --  Multi-consistency -- Pill -- Cervical Esophageal Comment -- No flowsheet data found. Germain Osgood 12/20/2016, 9:44 AM  Germain Osgood, M.A. CCC-SLP [phone]              Time Spent in minutes  25   Louellen Molder M.D on  12/31/2016 at 4:45 PM  Between 7am to 7pm - Pager - [phone]  After 7pm go to [email or web address] - password [facility]  [facility] -  Office  [phone]

## 2016-12-31 NOTE — Progress Notes (Signed)
PD started around 2000 pt. Is stable but drowsy.

## 2017-01-01 LAB — CBC
HCT: 54.2 % — ABNORMAL HIGH (ref 36.0–46.0)
HEMOGLOBIN: 16.9 g/dL — AB (ref 12.0–15.0)
MCH: 32.2 pg (ref 26.0–34.0)
MCHC: 31.2 g/dL (ref 30.0–36.0)
MCV: 103.2 fL — AB (ref 78.0–100.0)
Platelets: 352 10*3/uL (ref 150–400)
RBC: 5.25 MIL/uL — AB (ref 3.87–5.11)
RDW: 15.1 % (ref 11.5–15.5)
WBC: 10.3 10*3/uL (ref 4.0–10.5)

## 2017-01-01 MED ORDER — NEPRO/CARBSTEADY PO LIQD
237.0000 mL | Freq: Two times a day (BID) | ORAL | Status: DC
  Administered 2017-01-02: 237 mL via ORAL
  Filled 2017-01-01 (×8): qty 237

## 2017-01-01 NOTE — Progress Notes (Signed)
CM has met with the patient and her extended family over the last 2 days. Currently the plan is for patient to go to CIR if insurance approves and then to her brother in laws home. He and his wife have agreed to learn how to administer the peritoneal dialysis and provide her care after a rehab stay.  CM inquired about LTACH with the peritoneal dialysis and none here or close to the patients brother in law will take peritoneal dialysis. Patient and her family made aware.  CM also looked into SNF rehabs that accept peritoneal dialysis and the only locations CM was able to find are located in [place], [place] and [place]. Family and patient aware.  CM continuing to follow.

## 2017-01-01 NOTE — Progress Notes (Signed)
Inpatient Rehabilitation  Note that Nephrology MD has spoken with patient regarding a repeat HD trial and patient is not open to it.  Updated brother and sister-in-law, Herbie Baltimore and Laurielle Selmon.  They are in agreement with plan to seek IP Rehab authorization from [facility].  Patient will then participate in CIR and after discharge patient and family will then attend home peritoneal dialysis training, and be set up follow-up resources near them in [place], [place] per Nephrology team.  Plan is for patient to discharge to Herbie Baltimore and Donna's home where they will care for her.  [employer] will not allow for [place] to open the case today per their 48 hour timeline.  Plan to open case and request insurance authorization Monday 7/2.  Please call with questions.  Carmelia Roller., CCC/SLP Admission Coordinator  [place]  Cell [phone]

## 2017-01-01 NOTE — Progress Notes (Signed)
PROGRESS NOTE                                                                                                                                                                                                             Patient Demographics:    Terry Juarez, is a 50 y.o. female, DOB - [birth year], WIO:[id number]  Admit date - 12/19/2016   Admitting Physician Reyne Dumas, MD  Outpatient Primary MD for the patient is Tisovec, Fransico Him, MD  LOS - 13    Chief Complaint  Patient presents with  . Seizures       Brief Narrative   51 year old female with ESRD on peritoneal dialysis, renal cell carcinoma status post right nephrectomy, hypertension, hyperlipidemia and anemia was found down on the floor actively seizing by her roommate. She was brought to the ED with symptoms of right frontal headache and acute left-sided weakness. Head CT showed large 29 mm right frontal hemorrhage with surrounding edema and mass effect along with small subarachnoid hemorrhages and signs of PRES. CT angiogram head and neck showed no AVMs but with increase in size of the hematoma. Patient to my neurology in the ED and started on nicardipine and  Keppra. MRI of the brain showed no change in the size of right frontal hematoma and also conformed PRES.  Last head CT on 6/21 showed stable right frontal hemorrhage with evolving surrounding edema and stable 3-4 mm midline shift.  Patient transferred to hospitalist service on 6/24.   Subjective:   Continues to be more interactive.    Assessment  & Plan :    Active Problems:   Intracranial hemorrhage (HCC) Right posterior frontal lobe acute hemorrhage with surrounding edema and local mass effect.  CT and MRI findings suggestive of PRES in the setting of hypertension. Also has subarachnoid hemorrhage. No vascular explanation for right frontoparietal hematoma. Has  residual left hemiparesis. 2-D  echo without cardiac source of emboli. EEG shows diffuse slowing. No antithrombotic or anticoagulation due to hemorrhage. Appreciate neurology's recommendation. Aggressive stroke risk factor management.  Follow-up with Dr Erlinda Hong in 6 weeks.  Acute seizures Likely triggered by intracranial hemorrhage. Continue empiric Keppra. EEG shows diffuse slowing.      PRES (posterior reversible encephalopathy syndrome) with hypertensive emergency SBP goal less than 160. Currently on 5 different blood pressure medications and is  well controlled.   Cardiac arrest on 6/22 Short cardiac arrest of 30 seconds while at hemodialysis on 6/22. Plan on no further attempt to hemodialysis due to labile blood pressure.  Acute respiratory failure with hypoxia Multifactorial secondary to hypertensive emergency, seizures and ICH Currently stable on room air.  ESRD on PD Not tolerating hemodialysis. Renal consult appreciated. Resumed Sensipar, calcitriol and PhosLo. Refused to be on HD in future.  History of renal cell carcinoma status post right nephrectomy  Polycythemia Etiology unclear. Monitor for now. Prior history of GI bleed with anemia, has responded well to ESA.   Constipation  daily MiraLAX.    Code Status : Full code  Family Communication  : None at bedside  Disposition Plan  :  Possibly CIR early next week once insurance approved and conformed the family  able to take her home for 24/7 care after discharge. Patient isn't accepted by any SNF or LTAC due to being on peritoneal dialysis.  Barriers For Discharge : Pending placement  Consults  :   Neurology Nephrology CIR   Procedures  :  Hemodialysis CT head CT angiogram head and neck MRI brain  DVT Prophylaxis  : SCDs  Lab Results  Component Value Date   PLT 352 01/01/2017    Antibiotics  :   Anti-infectives    None        Objective:   Vitals:   12/31/16 2213 01/01/17 0103 01/01/17 0532 01/01/17 0917  BP: (!) 146/80  133/75 127/79 (!) 142/86  Pulse: 75 73 68 66  Resp: 18 20 19 16   Temp: 98.2 F (36.8 C) 98.1 F (36.7 C) 97.9 F (36.6 C) 97.7 F (36.5 C)  TempSrc: Oral Oral Oral Oral  SpO2: 96% 95% 100% 93%  Weight:   54.4 kg (119 lb 14.9 oz)   Height:        Wt Readings from Last 3 Encounters:  01/01/17 54.4 kg (119 lb 14.9 oz)  09/04/16 54.4 kg (120 lb)  08/02/16 56.9 kg (125 lb 7.1 oz)     Intake/Output Summary (Last 24 hours) at 01/01/17 1254 Last data filed at 01/01/17 0815  Gross per 24 hour  Intake            10804 ml  Output            11675 ml  Net             -871 ml     Physical Exam Gen.: Middle aged female not in distress HEENT: Moist mucosa, supple neck Chest: Clear to auscultation CVS: Normal S1 and S2, no murmurs GI: Soft, nondistended, nontender, peritoneal and eyes catheter+ Musculoskeletal: Warm, no pedal edema CNS: Oriented, left hemiparesis      Data Review:    CBC  Recent Labs Lab 12/26/16 0443 01/01/17 0623  WBC 10.3 10.3  HGB 17.6* 16.9*  HCT 55.7* 54.2*  PLT 218 352  MCV 102.6* 103.2*  MCH 32.4 32.2  MCHC 31.6 31.2  RDW 15.8* 15.1    Chemistries   Recent Labs Lab 12/26/16 0443 12/27/16 0248 12/28/16 0317 12/29/16 0532 12/30/16 0525 12/31/16 0256  NA 137 137 136 137 134* 136  K 4.4 4.7 4.4 3.8 4.5 3.7  CL 99* 99* 100* 99* 98* 98*  CO2 21* 22 22 24 24 25   GLUCOSE 113* 116* 113* 124* 114* 119*  BUN 59* 60* 54* 52* 53* 49*  CREATININE 11.08* 11.78* 10.86* 10.91* 10.92* 10.53*  CALCIUM 8.9 9.1 9.5 9.5 9.7 10.0  MG 2.2  --  2.0 1.9 1.9 1.9   ------------------------------------------------------------------------------------------------------------------  Recent Labs  12/31/16 0256  TRIG 150*    No results found for: HGBA1C ------------------------------------------------------------------------------------------------------------------ No results for input(s): TSH, T4TOTAL, T3FREE, THYROIDAB in the last 72  hours.  Invalid input(s): FREET3 ------------------------------------------------------------------------------------------------------------------ No results for input(s): VITAMINB12, FOLATE, FERRITIN, TIBC, IRON, RETICCTPCT in the last 72 hours.  Coagulation profile No results for input(s): INR, PROTIME in the last 168 hours.  No results for input(s): DDIMER in the last 72 hours.  Cardiac Enzymes No results for input(s): CKMB, TROPONINI, MYOGLOBIN in the last 168 hours.  Invalid input(s): CK ------------------------------------------------------------------------------------------------------------------ No results found for: BNP  Inpatient Medications  Scheduled Meds: . amLODipine  10 mg Oral Daily  . calcitRIOL  0.25 mcg Oral Q M,W,F-1800  . calcium acetate  1,334 mg Oral TID WC  . carvedilol  12.5 mg Oral BID WC  . cinacalcet  60 mg Oral Q supper  . cloNIDine  0.1 mg Oral TID  . famotidine  20 mg Oral Daily  . gentamicin cream  1 application Topical Daily  . levETIRAcetam  500 mg Oral BID  . levothyroxine  88 mcg Oral QAC breakfast  . losartan  100 mg Oral QHS  . mouth rinse  15 mL Mouth Rinse BID  . multivitamin  1 tablet Oral Daily  . pantoprazole  40 mg Oral BID  . polyethylene glycol  17 g Oral Daily  . senna-docusate  1 tablet Oral BID   Continuous Infusions:  PRN Meds:.acetaminophen, dianeal solution for CAPD/CCPD with heparin, dianeal solution for CAPD/CCPD with heparin, dianeal solution for CAPD/CCPD with heparin  Micro Results No results found for this or any previous visit (from the past 240 hour(s)).  Radiology Reports Ct Angio Head W Or Wo Contrast  Result Date: 12/19/2016 CLINICAL DATA:  Intraparenchymal and subarachnoid hemorrhage. EXAM: CT ANGIOGRAPHY HEAD AND NECK TECHNIQUE: Multidetector CT imaging of the head and neck was performed using the standard protocol during bolus administration of intravenous contrast. Multiplanar CT image  reconstructions and MIPs were obtained to evaluate the vascular anatomy. Carotid stenosis measurements (when applicable) are obtained utilizing NASCET criteria, using the distal internal carotid diameter as the denominator. CONTRAST:  50 cc Isovue 370 intravenous COMPARISON:  Noncontrast head CT from earlier today FINDINGS: CTA NECK FINDINGS Aortic arch: Atheromatous wall thickening that is mild. No acute finding or aneurysm. Right carotid system: Mild atheromatous plaque at the common carotid bifurcation without stenosis or ulceration. Beaded ICA consistent with fibromuscular dysplasia. No superimposed acute dissection. Left carotid system: Scattered atheromatous plaque on the distal common carotid and proximal ICA, moderate for age. No flow limiting stenosis or ulceration. Beading of the left ICA consistent with fibromuscular dysplasia. No superimposed dissection. Vertebral arteries: Proximal subclavian atherosclerosis without flow limiting stenosis. Both vertebral arteries are smooth and widely patent to the dura. Left vertebral dominance. Skeleton: No acute or aggressive finding. Other neck: Negative for mass or inflammation. Upper chest: Negative Review of the MIP images confirms the above findings CTA HEAD FINDINGS Anterior circulation: Moderate calcified plaque on the carotid siphons without flow limiting stenosis. Standard branching. There is no evidence of aneurysm or vascular malformation. No major branch occlusion or proximal flow limiting stenosis. Posterior circulation: Left dominant vertebral artery. The vertebral and basilar arteries are smooth and widely patent. Aplastic right and mildly hypoplastic left P1 segments with large posterior communicating arteries. No branch occlusion, stenosis, aneurysm, or evidence of vascular malformation. Venous sinuses: Patent on the  delayed phase. Anatomic variants: Fetal type PCA circulation, especially on the right. Delayed phase: No masslike enhancement. Pending  brain MRI. The right frontal parietal hematoma is larger than before, measuring 27 x 34 x 35 mm as compared to 20 x 31 x 28 mm. Local subarachnoid hemorrhage and mild cerebral edema without shift or herniation. Review of the MIP images confirms the above findings IMPRESSION: 1. No vascular explanation for the right frontal parietal hematoma. Dural venous sinuses are patent and there is no evidence of vascular malformation. 2. The 16 cc hematoma is mildly increased from earlier CT. Negative for shift or herniation. 3. Fibromuscular dysplasia of the bilateral cervical ICA. 4. Moderate atherosclerosis for age. Negative for flow limiting stenosis. Electronically Signed   By: Monte Fantasia M.D.   On: 12/19/2016 19:37   Ct Head Wo Contrast  Result Date: 12/24/2016 CLINICAL DATA:  Intraparenchymal hemorrhage. EXAM: CT HEAD WITHOUT CONTRAST TECHNIQUE: Contiguous axial images were obtained from the base of the skull through the vertex without intravenous contrast. COMPARISON:  Multiple prior head CTs, most recently 12/22/2016 FINDINGS: Brain: The right frontal hemorrhage is stable in size. Evolving surrounding edema is noted. 3-4 mm of midline shift is not significantly changed. No new hemorrhage is present. The subarachnoid hemorrhage is not significantly changed. Vascular: Atherosclerotic calcifications are again noted. Skull: Calvarium is intact. Sinuses/Orbits: The paranasal sinuses and mastoid air cells are clear. IMPRESSION: 1. Stable right frontal hemorrhage with evolving surrounding edema. 2. Stable 3-4 mm of midline shift. Electronically Signed   By: San Morelle M.D.   On: 12/24/2016 12:22   Ct Head Wo Contrast  Result Date: 12/22/2016 CLINICAL DATA:  Patient admitted for [place], stroke. MRI demonstrated PRES. EXAM: CT HEAD WITHOUT CONTRAST TECHNIQUE: Contiguous axial images were obtained from the base of the skull through the vertex without intravenous contrast. COMPARISON:  Multiple priors, most  recent earlier today at 3:55 a.m. FINDINGS: Brain: Redemonstrated is an intraparenchymal hemorrhage, not significantly changed in size or shape, roughly 4 x 3 x 5 cm. Mild surrounding vasogenic edema is not increased. Slight effacement RIGHT lateral ventricle. Unchanged midline shift RIGHT to LEFT, roughly 3 mm. No hydrocephalus or ventricular trapping. Effacement of the basilar cisterns redemonstrated. Vascular: No hyperdense vessel or unexpected calcification. Skull: Normal. Negative for fracture or focal lesion. Sinuses/Orbits: No acute finding. Other: None. IMPRESSION: Stable intraparenchymal hemorrhage. Subtle signs of midline shift, and slight increased pressure. But no significant worsening from priors. Electronically Signed   By: Staci Righter M.D.   On: 12/22/2016 09:47   Ct Head Wo Contrast  Result Date: 12/22/2016 CLINICAL DATA:  Follow-up examination for acute intracranial hemorrhage. EXAM: CT HEAD WITHOUT CONTRAST TECHNIQUE: Contiguous axial images were obtained from the base of the skull through the vertex without intravenous contrast. COMPARISON:  Prior CT from 12/21/2016. FINDINGS: Brain: Intraparenchymal hemorrhage centered at the high right frontal parietal region again seen. Hemorrhage overall not significantly changed measuring 4.1 x 3.3 x 5.4 cm seen greatest dimensions (previously 4.1 x 3.4 x 5.5 cm). Surrounding low-density vasogenic edema relatively with sulcal effacement relatively similar. Mass effect on the right lateral ventricle which is partially effaced relatively unchanged. Right-to-left midline shift measures approximately 3 mm, similar. No hydrocephalus or ventricular trapping. No intraventricular extension of hemorrhage. Adjacent small volume subarachnoid hemorrhage noted, similar. Adjacent extra-axial hemorrhage unchanged. No new acute intracranial hemorrhage. No evidence for acute large vessel territory infarct. Vascular: No hyperdense vessel. Scattered vascular  calcifications noted within the carotid siphons. Skull: Scalp soft  tissues and calvarium unchanged, and remain within normal limits. Sinuses/Orbits: Globes and orbital soft tissues within normal limits. Paranasal sinuses and mastoid air cells remain clear. IMPRESSION: 1. No significant interval change in posterior right frontal intraparenchymal hematoma with extra-axial extension. Similar localized edema and mass effect with 3 mm right-to-left shift. 2. No other new acute intracranial process. Electronically Signed   By: Jeannine Boga M.D.   On: 12/22/2016 04:49   Ct Head Wo Contrast  Result Date: 12/21/2016 CLINICAL DATA:  Intracranial hemorrhage. EXAM: CT HEAD WITHOUT CONTRAST TECHNIQUE: Contiguous axial images were obtained from the base of the skull through the vertex without intravenous contrast. COMPARISON:  CT head without contrast 12/20/2016 and 12/19/2016. FINDINGS: Brain: The focal hyperdense hemorrhage within the high right frontal lobe extends to the cortex. The area of hemorrhage continues to slowly increase in size. Maximal dimensions on coronal and sagittal reformats are now 4.1 x 3.4 by 5.5 cm. This compares with previous measurements of 3.8 x 8 3.3 x 5.2 cm and is significantly larger than the regional measurements. Adjacent extra-axial hemorrhage is similar the prior study. Mild edema surrounds the hemorrhage. Local mass effect is present with effacement of the sulci. Minimal midline shift is present. There is increasing effacement of the right lateral ventricle. Vascular: Atherosclerotic calcifications are present within the cavernous internal carotid arteries. There is no hyperdense vessel. Skull: The calvarium is intact. No focal lytic or blastic lesions are present. Sinuses/Orbits: The paranasal sinuses and mastoid air cells are clear. The globes and orbits are unremarkable. IMPRESSION: 1. Continued increase in size of right frontal parenchymal hemorrhage with extra-axial extension.  2. Slight increase in associated mass effect. These results were called by telephone at the time of interpretation on 12/21/2016 at 9:21 am to Dr. Rosalin Hawking , who verbally acknowledged these results. Electronically Signed   By: San Morelle M.D.   On: 12/21/2016 09:29   Ct Head Wo Contrast  Result Date: 12/20/2016 CLINICAL DATA:  Intracranial hemorrhage follow up EXAM: CT HEAD WITHOUT CONTRAST TECHNIQUE: Contiguous axial images were obtained from the base of the skull through the vertex without intravenous contrast. COMPARISON:  Brain MRI 12/19/2016 Head CT 12/19/2016 Head CTA 12/19/2016 FINDINGS: Brain: Intraparenchymal hematoma centered in the right frontal lobe now measures 3.8 x 3.4 x 3.8 cm, previously 3.7 x 3.8 by 3.0 cm. Surrounding edema is unchanged. There is subarachnoid blood over the right convexity, also unchanged. There is no midline shift. No hydrocephalus. Basal cisterns remain patent. Vascular: No hyperdense vessel or unexpected calcification. Skull: Normal visualized skull base, calvarium and extracranial soft tissues. Sinuses/Orbits: No sinus fluid levels or advanced mucosal thickening. No mastoid effusion. Normal orbits. IMPRESSION: 1. Slightly increased size of right frontal intraparenchymal hematoma with unchanged right convexity subarachnoid blood, compared to head CTA performed 12/19/2016. Compared to the initial head CT from this presentation, obtained at 5:02 p.m. on 12/19/2016, the hematoma has increased in size. 2. No new hemorrhage, hydrocephalus or mass effect. Electronically Signed   By: Ulyses Jarred M.D.   On: 12/20/2016 06:29   Ct Head Wo Contrast  Result Date: 12/19/2016 CLINICAL DATA:  51 y/o  F; seizure. EXAM: CT HEAD WITHOUT CONTRAST CT CERVICAL SPINE WITHOUT CONTRAST TECHNIQUE: Multidetector CT imaging of the head and cervical spine was performed following the standard protocol without intravenous contrast. Multiplanar CT image reconstructions of the cervical  spine were also generated. COMPARISON:  02/11/2015 CT of the head. FINDINGS: CT HEAD FINDINGS Brain: Right posterior frontal lobe acute hemorrhage  measuring 22 x 29 x 21 mm (AP x ML x CC series 3 image 26 and series 5, image 34). Small volume of subarachnoid hemorrhage with overlying the right frontal convexity and along the falx. There is a small surrounding region of hypoattenuation in the brain compatible with edema with local mass effect effacing sulci. No significant midline shift. No evidence for herniation. No large territory acute stroke identified. There are foci of hypoattenuation within subcortical white matter in the bilateral parietal lobes and occipital lobes. The distribution is suggestive of PRES. Vascular: Extensive calcific atherosclerosis of the carotid siphons. Skull: Normal. Negative for fracture or focal lesion. Sinuses/Orbits: No acute finding. Other: None. CT CERVICAL SPINE FINDINGS Alignment: Normal. Skull base and vertebrae: No acute fracture. No primary bone lesion or focal pathologic process. Soft tissues and spinal canal: No prevertebral fluid or swelling. No visible canal hematoma. Disc levels: Straightening of cervical lordosis and discogenic degenerative changes with mild disc space narrowing and marginal osteophytes from the C3 through C5 levels. Upper chest: Negative. Other: Negative appear IMPRESSION: 1. Right posterior frontal lobe acute hemorrhage measuring up to 29 mm with small area of surrounding edema and local mass effect. No significant midline shift or herniation. Follow-up to ensure resolution is recommended to exclude an underlying mass. 2. Small volume of subarachnoid hemorrhage overlying the right frontal convexity. 3. Hypoattenuation within subcortical white matter and bilateral parietal and occipital lobes. Findings are suggestive of PRES in the setting of hypertension. This can be further characterized with MRI of the brain. 4. No acute fracture or dislocation of  the cervical spine. These results were called by telephone at the time of interpretation on 12/19/2016 at 5:12 pm to Dr. Lajean Saver , who verbally acknowledged these results. Electronically Signed   By: Kristine Garbe M.D.   On: 12/19/2016 17:23   Ct Angio Neck W And/or Wo Contrast  Result Date: 12/19/2016 CLINICAL DATA:  Intraparenchymal and subarachnoid hemorrhage. EXAM: CT ANGIOGRAPHY HEAD AND NECK TECHNIQUE: Multidetector CT imaging of the head and neck was performed using the standard protocol during bolus administration of intravenous contrast. Multiplanar CT image reconstructions and MIPs were obtained to evaluate the vascular anatomy. Carotid stenosis measurements (when applicable) are obtained utilizing NASCET criteria, using the distal internal carotid diameter as the denominator. CONTRAST:  50 cc Isovue 370 intravenous COMPARISON:  Noncontrast head CT from earlier today FINDINGS: CTA NECK FINDINGS Aortic arch: Atheromatous wall thickening that is mild. No acute finding or aneurysm. Right carotid system: Mild atheromatous plaque at the common carotid bifurcation without stenosis or ulceration. Beaded ICA consistent with fibromuscular dysplasia. No superimposed acute dissection. Left carotid system: Scattered atheromatous plaque on the distal common carotid and proximal ICA, moderate for age. No flow limiting stenosis or ulceration. Beading of the left ICA consistent with fibromuscular dysplasia. No superimposed dissection. Vertebral arteries: Proximal subclavian atherosclerosis without flow limiting stenosis. Both vertebral arteries are smooth and widely patent to the dura. Left vertebral dominance. Skeleton: No acute or aggressive finding. Other neck: Negative for mass or inflammation. Upper chest: Negative Review of the MIP images confirms the above findings CTA HEAD FINDINGS Anterior circulation: Moderate calcified plaque on the carotid siphons without flow limiting stenosis. Standard  branching. There is no evidence of aneurysm or vascular malformation. No major branch occlusion or proximal flow limiting stenosis. Posterior circulation: Left dominant vertebral artery. The vertebral and basilar arteries are smooth and widely patent. Aplastic right and mildly hypoplastic left P1 segments with large posterior communicating arteries. No  branch occlusion, stenosis, aneurysm, or evidence of vascular malformation. Venous sinuses: Patent on the delayed phase. Anatomic variants: Fetal type PCA circulation, especially on the right. Delayed phase: No masslike enhancement. Pending brain MRI. The right frontal parietal hematoma is larger than before, measuring 27 x 34 x 35 mm as compared to 20 x 31 x 28 mm. Local subarachnoid hemorrhage and mild cerebral edema without shift or herniation. Review of the MIP images confirms the above findings IMPRESSION: 1. No vascular explanation for the right frontal parietal hematoma. Dural venous sinuses are patent and there is no evidence of vascular malformation. 2. The 16 cc hematoma is mildly increased from earlier CT. Negative for shift or herniation. 3. Fibromuscular dysplasia of the bilateral cervical ICA. 4. Moderate atherosclerosis for age. Negative for flow limiting stenosis. Electronically Signed   By: Monte Fantasia M.D.   On: 12/19/2016 19:37   Ct Cervical Spine Wo Contrast  Result Date: 12/19/2016 CLINICAL DATA:  51 y/o  F; seizure. EXAM: CT HEAD WITHOUT CONTRAST CT CERVICAL SPINE WITHOUT CONTRAST TECHNIQUE: Multidetector CT imaging of the head and cervical spine was performed following the standard protocol without intravenous contrast. Multiplanar CT image reconstructions of the cervical spine were also generated. COMPARISON:  02/11/2015 CT of the head. FINDINGS: CT HEAD FINDINGS Brain: Right posterior frontal lobe acute hemorrhage measuring 22 x 29 x 21 mm (AP x ML x CC series 3 image 26 and series 5, image 34). Small volume of subarachnoid hemorrhage  with overlying the right frontal convexity and along the falx. There is a small surrounding region of hypoattenuation in the brain compatible with edema with local mass effect effacing sulci. No significant midline shift. No evidence for herniation. No large territory acute stroke identified. There are foci of hypoattenuation within subcortical white matter in the bilateral parietal lobes and occipital lobes. The distribution is suggestive of PRES. Vascular: Extensive calcific atherosclerosis of the carotid siphons. Skull: Normal. Negative for fracture or focal lesion. Sinuses/Orbits: No acute finding. Other: None. CT CERVICAL SPINE FINDINGS Alignment: Normal. Skull base and vertebrae: No acute fracture. No primary bone lesion or focal pathologic process. Soft tissues and spinal canal: No prevertebral fluid or swelling. No visible canal hematoma. Disc levels: Straightening of cervical lordosis and discogenic degenerative changes with mild disc space narrowing and marginal osteophytes from the C3 through C5 levels. Upper chest: Negative. Other: Negative appear IMPRESSION: 1. Right posterior frontal lobe acute hemorrhage measuring up to 29 mm with small area of surrounding edema and local mass effect. No significant midline shift or herniation. Follow-up to ensure resolution is recommended to exclude an underlying mass. 2. Small volume of subarachnoid hemorrhage overlying the right frontal convexity. 3. Hypoattenuation within subcortical white matter and bilateral parietal and occipital lobes. Findings are suggestive of PRES in the setting of hypertension. This can be further characterized with MRI of the brain. 4. No acute fracture or dislocation of the cervical spine. These results were called by telephone at the time of interpretation on 12/19/2016 at 5:12 pm to Dr. Lajean Saver , who verbally acknowledged these results. Electronically Signed   By: Kristine Garbe M.D.   On: 12/19/2016 17:23   Mr Brain Wo  Contrast  Result Date: 12/19/2016 CLINICAL DATA:  Seizure and altered mental status. EXAM: MRI HEAD WITHOUT CONTRAST TECHNIQUE: Multiplanar, multiecho pulse sequences of the brain and surrounding structures were obtained without intravenous contrast. COMPARISON:  CTA head neck from earlier the same day FINDINGS: Brain: Acute parenchymal hematoma in the posterior  right frontal lobe is size stable from CT 60 minutes prior. There is subarachnoid FLAIR signal hyperintensity along the right more than left convexity. On the right there is associated susceptibility on gradient imaging correlating with subarachnoid hemorrhage by CT. There is bilateral cortical and subcortical T2 hyperintensity within the predominantly high and posterior cerebral convexities, also seen along the left putamen. No hydrocephalus. No primary infarct findings. Vascular: Major flow voids are preserved. Skull and upper cervical spine: Negative for marrow lesion Sinuses/Orbits: Negative Other: These results were called by telephone at the time of interpretation on 12/19/2016 at 8:30 pm to Dr. Lajean Saver , who verbally acknowledged these results. IMPRESSION: 1. Findings of posterior reversible encephalopathy syndrome. 2. Size stable posterior right frontal hematoma compared to CTA 1 hour prior. Electronically Signed   By: Monte Fantasia M.D.   On: 12/19/2016 20:35   Dg Chest Port 1 View  Result Date: 12/25/2016 CLINICAL DATA:  Intubation. EXAM: PORTABLE CHEST 1 VIEW COMPARISON:  07/29/2016 . FINDINGS: Endotracheal tube noted with its tip 4.1 cm above the carina. Heart size normal. No focal infiltrate. Prominent nipple shadows again noted. No pleural effusion or pneumothorax. IMPRESSION: 1.  Endotracheal tube tip noted 4.1 cm above the carina. 2. No acute cardiopulmonary disease. Electronically Signed   By: Marcello Moores  Register   On: 12/25/2016 07:14   Dg Swallowing Func-speech Pathology  Result Date: 12/20/2016 Objective Swallowing  Evaluation: Type of Study: MBS-Modified Barium Swallow Study Patient Details Name: PERMELIA BAMBA MRN: [id number] Date of Birth: Jan 12, 1966 Today's Date: 12/20/2016 Time: SLP Start Time (ACUTE ONLY): 0914-SLP Stop Time (ACUTE ONLY): 9518 SLP Time Calculation (min) (ACUTE ONLY): 13 min Past Medical History: Past Medical History: Diagnosis Date . Anemia  . Bruises easily  . Dialysis patient ([place])  . ESRD on dialysis ([place])  . Hyperlipidemia  . Hypertension  . Renal disorder   rt renal mass / < functioning of left kidney - being prepared for possible dialysis . Right renal mass  Past Surgical History: Past Surgical History: Procedure Laterality Date . AV FISTULA PLACEMENT Right 07/11/2014  Procedure: ARTERIOVENOUS (AV) FISTULA CREATION RIGHT ARM BRACHIO-CEPHALIC WITH ATTEMPTED RADIO-CEPHALIC (AV) FISTULA;  Surgeon: Mal Misty, MD;  Location: [facility];  Service: Vascular;  Laterality: Right; . COLONOSCOPY WITH PROPOFOL N/A 09/04/2016  Procedure: COLONOSCOPY WITH PROPOFOL;  Surgeon: Carol Ada, MD;  Location: [facility];  Service: Endoscopy;  Laterality: N/A; . ECTOPIC PREGNANCY SURGERY  1987 . ESOPHAGOGASTRODUODENOSCOPY N/A 07/30/2016  Procedure: ESOPHAGOGASTRODUODENOSCOPY (EGD);  Surgeon: Carol Ada, MD;  Location: [facility];  Service: Endoscopy;  Laterality: N/A;  Bedside . ESOPHAGOGASTRODUODENOSCOPY (EGD) WITH PROPOFOL N/A 09/04/2016  Procedure: ESOPHAGOGASTRODUODENOSCOPY (EGD) WITH PROPOFOL;  Surgeon: Carol Ada, MD;  Location: [facility];  Service: Endoscopy;  Laterality: N/A; . INSERTION OF DIALYSIS CATHETER Right 07/11/2014  Procedure: INSERTION OF DIALYSIS CATHETER IN RIGHT INTERNAL JUGULAR ;  Surgeon: Mal Misty, MD;  Location: [facility];  Service: Vascular;  Laterality: Right; . LAPAROSCOPIC NEPHRECTOMY Right 07/25/2014  Procedure: RIGHT LAPAROSCOPIC RADICAL NEPHRECTOMY ;  Surgeon: Ardis Hughs, MD;  Location: [facility];  Service: Urology;  Laterality: Right; . PATCH ANGIOPLASTY Right 07/11/2014  Procedure:  PATCH ANGIOPLASTY OF RIGHT RADIAL ARTERY USING CEPHALIC VEIN.;  Surgeon: Mal Misty, MD;  Location: [place] [facility];  Service: Vascular;  Laterality: Right; . THROMBECTOMY W/ EMBOLECTOMY Right 07/11/2014  Procedure: THROMBECTOMY OF RIGHT RADIAL ARTERY  ;  Surgeon: Mal Misty, MD;  Location: [facility];  Service: Vascular;  Laterality: Right; HPI: 51  yo F with history of ESRD on Peritoneal Dialysis, HTN, Right renal mass, and Anemia, who was found down on the floor seizing. She was found to have a large right frontal hemorrhage with surrounding edema and mass effect, also with small SAH and signs of PRES. Pt describes subtle dysphagia and vocal quality changes following thyroid surgery in April. Subjective: pt alert, eager for POs, particularly before she starts dialysis Assessment / Plan / Recommendation [facility] IP CLINICAL IMPRESSIONS 12/20/2016 Clinical Impression Pt's oropharyngeal swallow appears to be [facility], with no aspiration or penetration observed. Of note, she did not appear to have as many audible swallows or throat clearings during MBS as was observed at bedside, but when she did have throat clearing it was no associated with any airway compromise. She fairly consistently completed second swallows, particularly with thin liquids, but there were only trace residuals (mostly a small coating of the tongue/base of tongue) remaining throughout her oropharynx. Recommend that pt initiate regular textures and thin liquids. SLP f/u not needed for swallowing, but recommend MD order SLP cognitive-linguistic evaluation. SLP Visit Diagnosis Dysphagia, unspecified (R13.10) Attention and concentration deficit following -- Frontal lobe and executive function deficit following -- Impact on safety and function Mild aspiration risk   [facility] IP TREATMENT RECOMMENDATION 12/20/2016 Treatment Recommendations No treatment recommended at this time   Prognosis 12/20/2016 Prognosis for Safe Diet Advancement Good Barriers to Reach Goals --  Barriers/Prognosis Comment -- [facility] IP DIET RECOMMENDATION 12/20/2016 SLP Diet Recommendations Regular solids;Thin liquid Liquid Administration via Cup;Straw Medication Administration Whole meds with liquid Compensations Slow rate;Small sips/bites Postural Changes Remain semi-upright after after feeds/meals (Comment);Seated upright at 90 degrees   [facility] IP OTHER RECOMMENDATIONS 12/20/2016 Recommended Consults -- Oral Care Recommendations Oral care BID Other Recommendations --   [facility] IP FOLLOW UP RECOMMENDATIONS 12/20/2016 Follow up Recommendations Other (comment)   No flowsheet data found.     [facility] IP ORAL PHASE 12/20/2016 Oral Phase WFL Oral - Pudding Teaspoon -- Oral - Pudding Cup -- Oral - Honey Teaspoon -- Oral - Honey Cup -- Oral - Nectar Teaspoon -- Oral - Nectar Cup -- Oral - Nectar Straw -- Oral - Thin Teaspoon -- Oral - Thin Cup -- Oral - Thin Straw -- Oral - Puree -- Oral - Mech Soft -- Oral - Regular -- Oral - Multi-Consistency -- Oral - Pill -- Oral Phase - Comment --  [facility] IP PHARYNGEAL PHASE 12/20/2016 Pharyngeal Phase WFL Pharyngeal- Pudding Teaspoon -- Pharyngeal -- Pharyngeal- Pudding Cup -- Pharyngeal -- Pharyngeal- Honey Teaspoon -- Pharyngeal -- Pharyngeal- Honey Cup -- Pharyngeal -- Pharyngeal- Nectar Teaspoon -- Pharyngeal -- Pharyngeal- Nectar Cup -- Pharyngeal -- Pharyngeal- Nectar Straw -- Pharyngeal -- Pharyngeal- Thin Teaspoon -- Pharyngeal -- Pharyngeal- Thin Cup -- Pharyngeal -- Pharyngeal- Thin Straw -- Pharyngeal -- Pharyngeal- Puree -- Pharyngeal -- Pharyngeal- Mechanical Soft -- Pharyngeal -- Pharyngeal- Regular -- Pharyngeal -- Pharyngeal- Multi-consistency -- Pharyngeal -- Pharyngeal- Pill -- Pharyngeal -- Pharyngeal Comment --  [facility] IP CERVICAL ESOPHAGEAL PHASE 12/20/2016 Cervical Esophageal Phase WFL Pudding Teaspoon -- Pudding Cup -- Honey Teaspoon -- Honey Cup -- Nectar Teaspoon -- Nectar Cup -- Nectar Straw -- Thin Teaspoon -- Thin Cup -- Thin Straw -- Puree -- Mechanical Soft -- Regular --  Multi-consistency -- Pill -- Cervical Esophageal Comment -- No flowsheet data found. Germain Osgood 12/20/2016, 9:44 AM  Germain Osgood, M.A. CCC-SLP [phone]              Time Spent in minutes  25  Louellen Molder M.D on 01/01/2017 at 12:54 PM  Between 7am to 7pm - Pager - [phone]  After 7pm go to [email or web address] - password [email or web address]  [facility] -  Office  [phone]

## 2017-01-01 NOTE — Progress Notes (Signed)
Initial Nutrition Assessment  DOCUMENTATION CODES:   Not applicable  INTERVENTION:  Encouraged adequate intake of calories and protein to help meet needs and prevent loss of body weight/lean body mass.  Provide Nepro Shake po BID, each supplement provides 425 kcal and 19 grams protein.   NUTRITION DIAGNOSIS:   Inadequate oral intake related to poor appetite, acute illness (ICH, SAH, left hemiparesis) as evidenced by per patient/family report.  GOAL:   Patient will meet greater than or equal to 90% of their needs  MONITOR:   PO intake, Supplement acceptance, Labs, Weight trends, I & O's  REASON FOR ASSESSMENT:   LOS    ASSESSMENT:   51 year old female with PMHx of HTN, HLD, ESRD on PD, hx of renal cell carcinoma s/p right nephrectomy, who presented after being found down on the floor seizing. Also presented with left sided weakness and right frontal headache. Found to have right posterior frontal lobe hemorrhage, PRES, SAH, and residual left hemiparesis.   -Pt had cardiac arrest of 30 seconds while at HD on 6/22. Does not want any further attempts at HD.  -CCPD Regimen: 2 L dwell volume, 5 fills in 24 hrs, half 2.5% dextrose and half 4.25% dextrose (provides 460-580 kcal daily) -Patient being followed by SLP. Per most recent note on 6/27 patient is tolerating dysphagia 3 diet with thin liquids. -Plan is for patient to transfer to [facility] Rehab pending insurance authorization followed by discharge home with family for care.  Spoke with patient at bedside. Patient is slow to respond but able to provide good history. She reports her appetite is improving now. It was poor earlier in admission because she did not feel hungry. She had 100% of lunch today and dinner last night per chart - patient confirms. She reports she finished about 50% of her breakfast this morning. She reports that PTA she had a good appetite and would eat at least 3 meals per day plus snacks. Attempted to get better  history on intake PTA, but patient unable to provide at this time. She reports she has been on PD for approximately 1 year. Prior to that was on HD. She reports she has tried Nepro before. She is amenable to drinking either Nepro or Pro-Stat to help meet calorie/protein needs and prevent any unintentional weight loss. Patient reports she is tolerating her current diet without any difficulty chewing or swallowing. Also denies any N/V, abdominal pain, or diarrhea. Does report constipation. Noted last BM was a small type 1 on 6/26.  Patient is unsure of her UBW but does not believe she has been losing weight. She reports her dry weight is either 53 kg or 54 kg. Patient looks weight stable per chart PTA. Weight fluctuations during admission with fluid.  Meal Completion: 50-100% in the past 24 hrs; previously 15-25%  Patient has had approximately 1399 kcal (71% minimum estimated kcal needs) and 47 grams of protein (63% minimum estimated protein needs) in the past 24 hrs.  Medications reviewed and include: Phoslo 1334 mg TID with meals, famotidine, Keppra, levothyroxine, Medline mouth rinse, Rena-Vit once daily, pantoprazole, Miralax, senna.  Labs reviewed: Chloride 98, Glucose 119, BUN 49, Creatinine 10.53, EGFR 4. Potassium WNL. Phosphorus 9.5 on 6/24.   Nutrition-Focused physical exam completed. Findings are no fat depletion, no muscle depletion, and no edema. Small body habitus, but no overt signs of muscle or fat wasting.  Patient does not meet criteria for malnutrition at this time but is at risk for acute malnutrition.  Discussed with RN.  Diet Order:  DIET DYS 3 Room service appropriate? Yes; Fluid consistency: Thin  Skin:  Reviewed, no issues  Last BM:  12/29/2016 - small type 1  Height:   Ht Readings from Last 1 Encounters:  12/20/16 '5\' 5"'  (1.651 m)    Weight:   Wt Readings from Last 1 Encounters:  01/01/17 119 lb 14.9 oz (54.4 kg)    Ideal Body Weight:  56.8 kg  BMI:  Body  mass index is 19.96 kg/m.  Estimated Nutritional Needs:   Kcal:  1970-2250 (35-40 kcal/kg adjusted body wt)  Protein:  75-85 grams (1.3-1.5 grams/kg adjusted body wt)  Fluid:  UOP + 1 L  EDUCATION NEEDS:   No education needs identified at this time  Willey Blade, MS, RD, LDN Pager: [phone] After Hours Pager: [phone]

## 2017-01-02 LAB — BASIC METABOLIC PANEL
ANION GAP: 13 (ref 5–15)
BUN: 47 mg/dL — ABNORMAL HIGH (ref 6–20)
CALCIUM: 10.6 mg/dL — AB (ref 8.9–10.3)
CHLORIDE: 99 mmol/L — AB (ref 101–111)
CO2: 27 mmol/L (ref 22–32)
Creatinine, Ser: 11.39 mg/dL — ABNORMAL HIGH (ref 0.44–1.00)
GFR calc non Af Amer: 3 mL/min — ABNORMAL LOW (ref >60)
GFR, EST AFRICAN AMERICAN: 4 mL/min — AB (ref >60)
Glucose, Bld: 130 mg/dL — ABNORMAL HIGH (ref 65–99)
POTASSIUM: 3.8 mmol/L (ref 3.5–5.1)
Sodium: 139 mmol/L (ref 135–145)

## 2017-01-02 MED ORDER — BISACODYL 10 MG RE SUPP
10.0000 mg | Freq: Every day | RECTAL | Status: DC | PRN
  Administered 2017-01-03: 10 mg via RECTAL
  Filled 2017-01-02: qty 1

## 2017-01-02 MED ORDER — HEPARIN 1000 UNIT/ML FOR PERITONEAL DIALYSIS
INTRAMUSCULAR | Status: DC | PRN
  Filled 2017-01-02: qty 3000

## 2017-01-02 MED ORDER — AMLODIPINE BESYLATE 5 MG PO TABS
5.0000 mg | ORAL_TABLET | Freq: Two times a day (BID) | ORAL | Status: DC
  Administered 2017-01-02 – 2017-01-04 (×4): 5 mg via ORAL
  Filled 2017-01-02 (×4): qty 1

## 2017-01-02 MED ORDER — HEPARIN 1000 UNIT/ML FOR PERITONEAL DIALYSIS
INTRAPERITONEAL | Status: DC | PRN
  Filled 2017-01-02: qty 5000

## 2017-01-02 MED ORDER — DELFLEX-LC/2.5% DEXTROSE 394 MOSM/L IP SOLN
INTRAPERITONEAL | Status: DC
  Administered 2017-01-02: 5000 mL via INTRAPERITONEAL

## 2017-01-02 MED ORDER — DELFLEX-LC/1.5% DEXTROSE 344 MOSM/L IP SOLN: INTRAPERITONEAL | Status: DC

## 2017-01-02 MED ORDER — DELFLEX-LC/1.5% DEXTROSE 344 MOSM/L IP SOLN
INTRAPERITONEAL | Status: DC
  Administered 2017-01-02: 5000 mL via INTRAPERITONEAL

## 2017-01-02 MED ORDER — HEPARIN 1000 UNIT/ML FOR PERITONEAL DIALYSIS: 500.0000 [IU] | INTRAMUSCULAR | Status: DC | PRN

## 2017-01-02 MED ORDER — GENTAMICIN SULFATE 0.1 % EX CREA
1.0000 "application " | TOPICAL_CREAM | Freq: Every day | CUTANEOUS | Status: DC
  Administered 2017-01-02: 1 via TOPICAL
  Filled 2017-01-02: qty 15

## 2017-01-02 NOTE — Progress Notes (Signed)
PROGRESS NOTE                                                                                                                                                                                                             Patient Demographics:    Terry Juarez, is a 51 y.o. female, DOB - 1966/02/02, XBD:[id number]  Admit date - 12/19/2016   Admitting Physician Reyne Dumas, MD  Outpatient Primary MD for the patient is Tisovec, Fransico Him, MD  LOS - 14    Chief Complaint  Patient presents with  . Seizures       Brief Narrative   51 year old female with ESRD on peritoneal dialysis, renal cell carcinoma status post right nephrectomy, hypertension, hyperlipidemia and anemia was found down on the floor actively seizing by her roommate. She was brought to the ED with symptoms of right frontal headache and acute left-sided weakness. Head CT showed large 29 mm right frontal hemorrhage with surrounding edema and mass effect along with small subarachnoid hemorrhages and signs of PRES. CT angiogram head and neck showed no AVMs but with increase in size of the hematoma. Patient to my neurology in the ED and started on nicardipine and  Keppra. MRI of the brain showed no change in the size of right frontal hematoma and also conformed PRES.  Last head CT on 6/21 showed stable right frontal hemorrhage with evolving surrounding edema and stable 3-4 mm midline shift.  Patient transferred to hospitalist service on 6/24.   Subjective:   Continues to be more interactive.Has not had bowel movement in 3 days.    Assessment  & Plan :    Active Problems:   Intracranial hemorrhage (HCC) Right posterior frontal lobe acute hemorrhage with surrounding edema and local mass effect.  CT and MRI findings suggestive of PRES in the setting of hypertension. Also has subarachnoid hemorrhage. No vascular explanation for right frontoparietal hematoma.  Has  residual left hemiparesis. 2-D echo without cardiac source of emboli. EEG shows diffuse slowing. No antithrombotic or anticoagulation due to hemorrhage. Appreciate neurology's recommendation. Aggressive stroke risk factor management.  Follow-up with Dr Erlinda Hong in 6 weeks.    PRES (posterior reversible encephalopathy syndrome) with hypertensive emergency SBP goal less than 160. Currently on 5 different blood pressure medications and is well controlled. If continues to be stable I will titrate  her medications (was in 3 different blood pressure medications at home)   Acute seizures Likely triggered by intracranial hemorrhage. Continue empiric Keppra. EEG shows diffuse slowing.   Cardiac arrest on 6/22 Short cardiac arrest of 30 seconds while at hemodialysis on 6/22. Plan on no further attempt to hemodialysis due to labile blood pressure.  Acute respiratory failure with hypoxia Multifactorial secondary to hypertensive emergency, seizures and ICH Currently stable on room air.  ESRD on PD Not tolerating hemodialysis. Renal consult appreciated. Resumed Sensipar, calcitriol and PhosLo. Refused to be on HD in future.  History of renal cell carcinoma status post right nephrectomy  Polycythemia Etiology unclear. Monitor for now. Prior history of GI bleed with anemia, has responded well to ESA.   Constipation  daily MiraLAX. Added Dulcolax suppository.    Code Status : Full code  Family Communication  : None at bedside  Disposition Plan  :  Possibly CIR early next week once insurance approved and conformed the family  able to take her home for 24/7 care after discharge.   Barriers For Discharge : Pending placement  Consults  :   Neurology Nephrology CIR   Procedures  :  Hemodialysis CT head CT angiogram head and neck MRI brain  DVT Prophylaxis  : SCDs  Lab Results  Component Value Date   PLT 352 01/01/2017    Antibiotics  :   Anti-infectives    None         Objective:   Vitals:   01/02/17 0000 01/02/17 0400 01/02/17 0815 01/02/17 0926  BP: 102/66 101/67  111/65  Pulse: 79 66  69  Resp: 16 18  16   Temp: 97.6 F (36.4 C) 98 F (36.7 C)  97.5 F (36.4 C)  TempSrc: Oral Oral  Oral  SpO2: 98% 99%  96%  Weight:   53.6 kg (118 lb 2.7 oz)   Height:        Wt Readings from Last 3 Encounters:  01/02/17 53.6 kg (118 lb 2.7 oz)  09/04/16 54.4 kg (120 lb)  08/02/16 56.9 kg (125 lb 7.1 oz)     Intake/Output Summary (Last 24 hours) at 01/02/17 1055 Last data filed at 01/02/17 0815  Gross per 24 hour  Intake            10249 ml  Output            12793 ml  Net            -2544 ml     Physical Exam Gen.: Middle aged female not in distress HEENT: Moist mucosa, supple neck Chest: Clear bilaterally, no added sounds   CVS: Normal S1 and S2, no murmurs GI: Soft, mild distention, nontender, peritoneal dialysis catheter + Musculoskeletal: Warm,  CNS: Alert and oriented, left hemiparesis       Data Review:    CBC  Recent Labs Lab 01/01/17 0623  WBC 10.3  HGB 16.9*  HCT 54.2*  PLT 352  MCV 103.2*  MCH 32.2  MCHC 31.2  RDW 15.1    Chemistries   Recent Labs Lab 12/28/16 0317 12/29/16 0532 12/30/16 0525 12/31/16 0256 01/02/17 0303  NA 136 137 134* 136 139  K 4.4 3.8 4.5 3.7 3.8  CL 100* 99* 98* 98* 99*  CO2 22 24 24 25 27   GLUCOSE 113* 124* 114* 119* 130*  BUN 54* 52* 53* 49* 47*  CREATININE 10.86* 10.91* 10.92* 10.53* 11.39*  CALCIUM 9.5 9.5 9.7 10.0 10.6*  MG 2.0 1.9 1.9  1.9  --    ------------------------------------------------------------------------------------------------------------------  Recent Labs  12/31/16 0256  TRIG 150*    No results found for: HGBA1C ------------------------------------------------------------------------------------------------------------------ No results for input(s): TSH, T4TOTAL, T3FREE, THYROIDAB in the last 72 hours.  Invalid input(s):  FREET3 ------------------------------------------------------------------------------------------------------------------ No results for input(s): VITAMINB12, FOLATE, FERRITIN, TIBC, IRON, RETICCTPCT in the last 72 hours.  Coagulation profile No results for input(s): INR, PROTIME in the last 168 hours.  No results for input(s): DDIMER in the last 72 hours.  Cardiac Enzymes No results for input(s): CKMB, TROPONINI, MYOGLOBIN in the last 168 hours.  Invalid input(s): CK ------------------------------------------------------------------------------------------------------------------ No results found for: BNP  Inpatient Medications  Scheduled Meds: . amLODipine  10 mg Oral Daily  . calcitRIOL  0.25 mcg Oral Q M,W,F-1800  . calcium acetate  1,334 mg Oral TID WC  . carvedilol  12.5 mg Oral BID WC  . cinacalcet  60 mg Oral Q supper  . cloNIDine  0.1 mg Oral TID  . famotidine  20 mg Oral Daily  . feeding supplement (NEPRO CARB STEADY)  237 mL Oral BID BM  . gentamicin cream  1 application Topical Daily  . levETIRAcetam  500 mg Oral BID  . levothyroxine  88 mcg Oral QAC breakfast  . losartan  100 mg Oral QHS  . mouth rinse  15 mL Mouth Rinse BID  . multivitamin  1 tablet Oral Daily  . pantoprazole  40 mg Oral BID  . polyethylene glycol  17 g Oral Daily  . senna-docusate  1 tablet Oral BID   Continuous Infusions:  PRN Meds:.acetaminophen, bisacodyl, dianeal solution for CAPD/CCPD with heparin, dianeal solution for CAPD/CCPD with heparin, dianeal solution for CAPD/CCPD with heparin  Micro Results No results found for this or any previous visit (from the past 240 hour(s)).  Radiology Reports Ct Angio Head W Or Wo Contrast  Result Date: 12/19/2016 CLINICAL DATA:  Intraparenchymal and subarachnoid hemorrhage. EXAM: CT ANGIOGRAPHY HEAD AND NECK TECHNIQUE: Multidetector CT imaging of the head and neck was performed using the standard protocol during bolus administration of intravenous  contrast. Multiplanar CT image reconstructions and MIPs were obtained to evaluate the vascular anatomy. Carotid stenosis measurements (when applicable) are obtained utilizing NASCET criteria, using the distal internal carotid diameter as the denominator. CONTRAST:  50 cc Isovue 370 intravenous COMPARISON:  Noncontrast head CT from earlier today FINDINGS: CTA NECK FINDINGS Aortic arch: Atheromatous wall thickening that is mild. No acute finding or aneurysm. Right carotid system: Mild atheromatous plaque at the common carotid bifurcation without stenosis or ulceration. Beaded ICA consistent with fibromuscular dysplasia. No superimposed acute dissection. Left carotid system: Scattered atheromatous plaque on the distal common carotid and proximal ICA, moderate for age. No flow limiting stenosis or ulceration. Beading of the left ICA consistent with fibromuscular dysplasia. No superimposed dissection. Vertebral arteries: Proximal subclavian atherosclerosis without flow limiting stenosis. Both vertebral arteries are smooth and widely patent to the dura. Left vertebral dominance. Skeleton: No acute or aggressive finding. Other neck: Negative for mass or inflammation. Upper chest: Negative Review of the MIP images confirms the above findings CTA HEAD FINDINGS Anterior circulation: Moderate calcified plaque on the carotid siphons without flow limiting stenosis. Standard branching. There is no evidence of aneurysm or vascular malformation. No major branch occlusion or proximal flow limiting stenosis. Posterior circulation: Left dominant vertebral artery. The vertebral and basilar arteries are smooth and widely patent. Aplastic right and mildly hypoplastic left P1 segments with large posterior communicating arteries. No branch occlusion, stenosis, aneurysm, or  evidence of vascular malformation. Venous sinuses: Patent on the delayed phase. Anatomic variants: Fetal type PCA circulation, especially on the right. Delayed phase: No  masslike enhancement. Pending brain MRI. The right frontal parietal hematoma is larger than before, measuring 27 x 34 x 35 mm as compared to 20 x 31 x 28 mm. Local subarachnoid hemorrhage and mild cerebral edema without shift or herniation. Review of the MIP images confirms the above findings IMPRESSION: 1. No vascular explanation for the right frontal parietal hematoma. Dural venous sinuses are patent and there is no evidence of vascular malformation. 2. The 16 cc hematoma is mildly increased from earlier CT. Negative for shift or herniation. 3. Fibromuscular dysplasia of the bilateral cervical ICA. 4. Moderate atherosclerosis for age. Negative for flow limiting stenosis. Electronically Signed   By: Monte Fantasia M.D.   On: 12/19/2016 19:37   Ct Head Wo Contrast  Result Date: 12/24/2016 CLINICAL DATA:  Intraparenchymal hemorrhage. EXAM: CT HEAD WITHOUT CONTRAST TECHNIQUE: Contiguous axial images were obtained from the base of the skull through the vertex without intravenous contrast. COMPARISON:  Multiple prior head CTs, most recently 12/22/2016 FINDINGS: Brain: The right frontal hemorrhage is stable in size. Evolving surrounding edema is noted. 3-4 mm of midline shift is not significantly changed. No new hemorrhage is present. The subarachnoid hemorrhage is not significantly changed. Vascular: Atherosclerotic calcifications are again noted. Skull: Calvarium is intact. Sinuses/Orbits: The paranasal sinuses and mastoid air cells are clear. IMPRESSION: 1. Stable right frontal hemorrhage with evolving surrounding edema. 2. Stable 3-4 mm of midline shift. Electronically Signed   By: San Morelle M.D.   On: 12/24/2016 12:22   Ct Head Wo Contrast  Result Date: 12/22/2016 CLINICAL DATA:  Patient admitted for [place], stroke. MRI demonstrated PRES. EXAM: CT HEAD WITHOUT CONTRAST TECHNIQUE: Contiguous axial images were obtained from the base of the skull through the vertex without intravenous contrast.  COMPARISON:  Multiple priors, most recent earlier today at 3:55 a.m. FINDINGS: Brain: Redemonstrated is an intraparenchymal hemorrhage, not significantly changed in size or shape, roughly 4 x 3 x 5 cm. Mild surrounding vasogenic edema is not increased. Slight effacement RIGHT lateral ventricle. Unchanged midline shift RIGHT to LEFT, roughly 3 mm. No hydrocephalus or ventricular trapping. Effacement of the basilar cisterns redemonstrated. Vascular: No hyperdense vessel or unexpected calcification. Skull: Normal. Negative for fracture or focal lesion. Sinuses/Orbits: No acute finding. Other: None. IMPRESSION: Stable intraparenchymal hemorrhage. Subtle signs of midline shift, and slight increased pressure. But no significant worsening from priors. Electronically Signed   By: Staci Righter M.D.   On: 12/22/2016 09:47   Ct Head Wo Contrast  Result Date: 12/22/2016 CLINICAL DATA:  Follow-up examination for acute intracranial hemorrhage. EXAM: CT HEAD WITHOUT CONTRAST TECHNIQUE: Contiguous axial images were obtained from the base of the skull through the vertex without intravenous contrast. COMPARISON:  Prior CT from 12/21/2016. FINDINGS: Brain: Intraparenchymal hemorrhage centered at the high right frontal parietal region again seen. Hemorrhage overall not significantly changed measuring 4.1 x 3.3 x 5.4 cm seen greatest dimensions (previously 4.1 x 3.4 x 5.5 cm). Surrounding low-density vasogenic edema relatively with sulcal effacement relatively similar. Mass effect on the right lateral ventricle which is partially effaced relatively unchanged. Right-to-left midline shift measures approximately 3 mm, similar. No hydrocephalus or ventricular trapping. No intraventricular extension of hemorrhage. Adjacent small volume subarachnoid hemorrhage noted, similar. Adjacent extra-axial hemorrhage unchanged. No new acute intracranial hemorrhage. No evidence for acute large vessel territory infarct. Vascular: No hyperdense  vessel. Scattered vascular  calcifications noted within the carotid siphons. Skull: Scalp soft tissues and calvarium unchanged, and remain within normal limits. Sinuses/Orbits: Globes and orbital soft tissues within normal limits. Paranasal sinuses and mastoid air cells remain clear. IMPRESSION: 1. No significant interval change in posterior right frontal intraparenchymal hematoma with extra-axial extension. Similar localized edema and mass effect with 3 mm right-to-left shift. 2. No other new acute intracranial process. Electronically Signed   By: Jeannine Boga M.D.   On: 12/22/2016 04:49   Ct Head Wo Contrast  Result Date: 12/21/2016 CLINICAL DATA:  Intracranial hemorrhage. EXAM: CT HEAD WITHOUT CONTRAST TECHNIQUE: Contiguous axial images were obtained from the base of the skull through the vertex without intravenous contrast. COMPARISON:  CT head without contrast 12/20/2016 and 12/19/2016. FINDINGS: Brain: The focal hyperdense hemorrhage within the high right frontal lobe extends to the cortex. The area of hemorrhage continues to slowly increase in size. Maximal dimensions on coronal and sagittal reformats are now 4.1 x 3.4 by 5.5 cm. This compares with previous measurements of 3.8 x 8 3.3 x 5.2 cm and is significantly larger than the regional measurements. Adjacent extra-axial hemorrhage is similar the prior study. Mild edema surrounds the hemorrhage. Local mass effect is present with effacement of the sulci. Minimal midline shift is present. There is increasing effacement of the right lateral ventricle. Vascular: Atherosclerotic calcifications are present within the cavernous internal carotid arteries. There is no hyperdense vessel. Skull: The calvarium is intact. No focal lytic or blastic lesions are present. Sinuses/Orbits: The paranasal sinuses and mastoid air cells are clear. The globes and orbits are unremarkable. IMPRESSION: 1. Continued increase in size of right frontal parenchymal hemorrhage  with extra-axial extension. 2. Slight increase in associated mass effect. These results were called by telephone at the time of interpretation on 12/21/2016 at 9:21 am to Dr. Rosalin Hawking , who verbally acknowledged these results. Electronically Signed   By: San Morelle M.D.   On: 12/21/2016 09:29   Ct Head Wo Contrast  Result Date: 12/20/2016 CLINICAL DATA:  Intracranial hemorrhage follow up EXAM: CT HEAD WITHOUT CONTRAST TECHNIQUE: Contiguous axial images were obtained from the base of the skull through the vertex without intravenous contrast. COMPARISON:  Brain MRI 12/19/2016 Head CT 12/19/2016 Head CTA 12/19/2016 FINDINGS: Brain: Intraparenchymal hematoma centered in the right frontal lobe now measures 3.8 x 3.4 x 3.8 cm, previously 3.7 x 3.8 by 3.0 cm. Surrounding edema is unchanged. There is subarachnoid blood over the right convexity, also unchanged. There is no midline shift. No hydrocephalus. Basal cisterns remain patent. Vascular: No hyperdense vessel or unexpected calcification. Skull: Normal visualized skull base, calvarium and extracranial soft tissues. Sinuses/Orbits: No sinus fluid levels or advanced mucosal thickening. No mastoid effusion. Normal orbits. IMPRESSION: 1. Slightly increased size of right frontal intraparenchymal hematoma with unchanged right convexity subarachnoid blood, compared to head CTA performed 12/19/2016. Compared to the initial head CT from this presentation, obtained at 5:02 p.m. on 12/19/2016, the hematoma has increased in size. 2. No new hemorrhage, hydrocephalus or mass effect. Electronically Signed   By: Ulyses Jarred M.D.   On: 12/20/2016 06:29   Ct Head Wo Contrast  Result Date: 12/19/2016 CLINICAL DATA:  51 y/o  F; seizure. EXAM: CT HEAD WITHOUT CONTRAST CT CERVICAL SPINE WITHOUT CONTRAST TECHNIQUE: Multidetector CT imaging of the head and cervical spine was performed following the standard protocol without intravenous contrast. Multiplanar CT image  reconstructions of the cervical spine were also generated. COMPARISON:  02/11/2015 CT of the head. FINDINGS: CT  HEAD FINDINGS Brain: Right posterior frontal lobe acute hemorrhage measuring 22 x 29 x 21 mm (AP x ML x CC series 3 image 26 and series 5, image 34). Small volume of subarachnoid hemorrhage with overlying the right frontal convexity and along the falx. There is a small surrounding region of hypoattenuation in the brain compatible with edema with local mass effect effacing sulci. No significant midline shift. No evidence for herniation. No large territory acute stroke identified. There are foci of hypoattenuation within subcortical white matter in the bilateral parietal lobes and occipital lobes. The distribution is suggestive of PRES. Vascular: Extensive calcific atherosclerosis of the carotid siphons. Skull: Normal. Negative for fracture or focal lesion. Sinuses/Orbits: No acute finding. Other: None. CT CERVICAL SPINE FINDINGS Alignment: Normal. Skull base and vertebrae: No acute fracture. No primary bone lesion or focal pathologic process. Soft tissues and spinal canal: No prevertebral fluid or swelling. No visible canal hematoma. Disc levels: Straightening of cervical lordosis and discogenic degenerative changes with mild disc space narrowing and marginal osteophytes from the C3 through C5 levels. Upper chest: Negative. Other: Negative appear IMPRESSION: 1. Right posterior frontal lobe acute hemorrhage measuring up to 29 mm with small area of surrounding edema and local mass effect. No significant midline shift or herniation. Follow-up to ensure resolution is recommended to exclude an underlying mass. 2. Small volume of subarachnoid hemorrhage overlying the right frontal convexity. 3. Hypoattenuation within subcortical white matter and bilateral parietal and occipital lobes. Findings are suggestive of PRES in the setting of hypertension. This can be further characterized with MRI of the brain. 4. No  acute fracture or dislocation of the cervical spine. These results were called by telephone at the time of interpretation on 12/19/2016 at 5:12 pm to Dr. Lajean Saver , who verbally acknowledged these results. Electronically Signed   By: Kristine Garbe M.D.   On: 12/19/2016 17:23   Ct Angio Neck W And/or Wo Contrast  Result Date: 12/19/2016 CLINICAL DATA:  Intraparenchymal and subarachnoid hemorrhage. EXAM: CT ANGIOGRAPHY HEAD AND NECK TECHNIQUE: Multidetector CT imaging of the head and neck was performed using the standard protocol during bolus administration of intravenous contrast. Multiplanar CT image reconstructions and MIPs were obtained to evaluate the vascular anatomy. Carotid stenosis measurements (when applicable) are obtained utilizing NASCET criteria, using the distal internal carotid diameter as the denominator. CONTRAST:  50 cc Isovue 370 intravenous COMPARISON:  Noncontrast head CT from earlier today FINDINGS: CTA NECK FINDINGS Aortic arch: Atheromatous wall thickening that is mild. No acute finding or aneurysm. Right carotid system: Mild atheromatous plaque at the common carotid bifurcation without stenosis or ulceration. Beaded ICA consistent with fibromuscular dysplasia. No superimposed acute dissection. Left carotid system: Scattered atheromatous plaque on the distal common carotid and proximal ICA, moderate for age. No flow limiting stenosis or ulceration. Beading of the left ICA consistent with fibromuscular dysplasia. No superimposed dissection. Vertebral arteries: Proximal subclavian atherosclerosis without flow limiting stenosis. Both vertebral arteries are smooth and widely patent to the dura. Left vertebral dominance. Skeleton: No acute or aggressive finding. Other neck: Negative for mass or inflammation. Upper chest: Negative Review of the MIP images confirms the above findings CTA HEAD FINDINGS Anterior circulation: Moderate calcified plaque on the carotid siphons without  flow limiting stenosis. Standard branching. There is no evidence of aneurysm or vascular malformation. No major branch occlusion or proximal flow limiting stenosis. Posterior circulation: Left dominant vertebral artery. The vertebral and basilar arteries are smooth and widely patent. Aplastic right and mildly hypoplastic  left P1 segments with large posterior communicating arteries. No branch occlusion, stenosis, aneurysm, or evidence of vascular malformation. Venous sinuses: Patent on the delayed phase. Anatomic variants: Fetal type PCA circulation, especially on the right. Delayed phase: No masslike enhancement. Pending brain MRI. The right frontal parietal hematoma is larger than before, measuring 27 x 34 x 35 mm as compared to 20 x 31 x 28 mm. Local subarachnoid hemorrhage and mild cerebral edema without shift or herniation. Review of the MIP images confirms the above findings IMPRESSION: 1. No vascular explanation for the right frontal parietal hematoma. Dural venous sinuses are patent and there is no evidence of vascular malformation. 2. The 16 cc hematoma is mildly increased from earlier CT. Negative for shift or herniation. 3. Fibromuscular dysplasia of the bilateral cervical ICA. 4. Moderate atherosclerosis for age. Negative for flow limiting stenosis. Electronically Signed   By: Monte Fantasia M.D.   On: 12/19/2016 19:37   Ct Cervical Spine Wo Contrast  Result Date: 12/19/2016 CLINICAL DATA:  51 y/o  F; seizure. EXAM: CT HEAD WITHOUT CONTRAST CT CERVICAL SPINE WITHOUT CONTRAST TECHNIQUE: Multidetector CT imaging of the head and cervical spine was performed following the standard protocol without intravenous contrast. Multiplanar CT image reconstructions of the cervical spine were also generated. COMPARISON:  02/11/2015 CT of the head. FINDINGS: CT HEAD FINDINGS Brain: Right posterior frontal lobe acute hemorrhage measuring 22 x 29 x 21 mm (AP x ML x CC series 3 image 26 and series 5, image 34). Small  volume of subarachnoid hemorrhage with overlying the right frontal convexity and along the falx. There is a small surrounding region of hypoattenuation in the brain compatible with edema with local mass effect effacing sulci. No significant midline shift. No evidence for herniation. No large territory acute stroke identified. There are foci of hypoattenuation within subcortical white matter in the bilateral parietal lobes and occipital lobes. The distribution is suggestive of PRES. Vascular: Extensive calcific atherosclerosis of the carotid siphons. Skull: Normal. Negative for fracture or focal lesion. Sinuses/Orbits: No acute finding. Other: None. CT CERVICAL SPINE FINDINGS Alignment: Normal. Skull base and vertebrae: No acute fracture. No primary bone lesion or focal pathologic process. Soft tissues and spinal canal: No prevertebral fluid or swelling. No visible canal hematoma. Disc levels: Straightening of cervical lordosis and discogenic degenerative changes with mild disc space narrowing and marginal osteophytes from the C3 through C5 levels. Upper chest: Negative. Other: Negative appear IMPRESSION: 1. Right posterior frontal lobe acute hemorrhage measuring up to 29 mm with small area of surrounding edema and local mass effect. No significant midline shift or herniation. Follow-up to ensure resolution is recommended to exclude an underlying mass. 2. Small volume of subarachnoid hemorrhage overlying the right frontal convexity. 3. Hypoattenuation within subcortical white matter and bilateral parietal and occipital lobes. Findings are suggestive of PRES in the setting of hypertension. This can be further characterized with MRI of the brain. 4. No acute fracture or dislocation of the cervical spine. These results were called by telephone at the time of interpretation on 12/19/2016 at 5:12 pm to Dr. Lajean Saver , who verbally acknowledged these results. Electronically Signed   By: Kristine Garbe M.D.    On: 12/19/2016 17:23   Mr Brain Wo Contrast  Result Date: 12/19/2016 CLINICAL DATA:  Seizure and altered mental status. EXAM: MRI HEAD WITHOUT CONTRAST TECHNIQUE: Multiplanar, multiecho pulse sequences of the brain and surrounding structures were obtained without intravenous contrast. COMPARISON:  CTA head neck from earlier the same  day FINDINGS: Brain: Acute parenchymal hematoma in the posterior right frontal lobe is size stable from CT 60 minutes prior. There is subarachnoid FLAIR signal hyperintensity along the right more than left convexity. On the right there is associated susceptibility on gradient imaging correlating with subarachnoid hemorrhage by CT. There is bilateral cortical and subcortical T2 hyperintensity within the predominantly high and posterior cerebral convexities, also seen along the left putamen. No hydrocephalus. No primary infarct findings. Vascular: Major flow voids are preserved. Skull and upper cervical spine: Negative for marrow lesion Sinuses/Orbits: Negative Other: These results were called by telephone at the time of interpretation on 12/19/2016 at 8:30 pm to Dr. Lajean Saver , who verbally acknowledged these results. IMPRESSION: 1. Findings of posterior reversible encephalopathy syndrome. 2. Size stable posterior right frontal hematoma compared to CTA 1 hour prior. Electronically Signed   By: Monte Fantasia M.D.   On: 12/19/2016 20:35   Dg Chest Port 1 View  Result Date: 12/25/2016 CLINICAL DATA:  Intubation. EXAM: PORTABLE CHEST 1 VIEW COMPARISON:  07/29/2016 . FINDINGS: Endotracheal tube noted with its tip 4.1 cm above the carina. Heart size normal. No focal infiltrate. Prominent nipple shadows again noted. No pleural effusion or pneumothorax. IMPRESSION: 1.  Endotracheal tube tip noted 4.1 cm above the carina. 2. No acute cardiopulmonary disease. Electronically Signed   By: Marcello Moores  Register   On: 12/25/2016 07:14   Dg Swallowing Func-speech Pathology  Result Date:  12/20/2016 Objective Swallowing Evaluation: Type of Study: MBS-Modified Barium Swallow Study Patient Details Name: CASIMIRA SUTPHIN MRN: [id number] Date of Birth: Jun 13, 1966 Today's Date: 12/20/2016 Time: SLP Start Time (ACUTE ONLY): 0914-SLP Stop Time (ACUTE ONLY): 9379 SLP Time Calculation (min) (ACUTE ONLY): 13 min Past Medical History: Past Medical History: Diagnosis Date . Anemia  . Bruises easily  . Dialysis patient ([place])  . ESRD on dialysis ([place])  . Hyperlipidemia  . Hypertension  . Renal disorder   rt renal mass / < functioning of left kidney - being prepared for possible dialysis . Right renal mass  Past Surgical History: Past Surgical History: Procedure Laterality Date . AV FISTULA PLACEMENT Right 07/11/2014  Procedure: ARTERIOVENOUS (AV) FISTULA CREATION RIGHT ARM BRACHIO-CEPHALIC WITH ATTEMPTED RADIO-CEPHALIC (AV) FISTULA;  Surgeon: Mal Misty, MD;  Location: [place];  Service: Vascular;  Laterality: Right; . COLONOSCOPY WITH PROPOFOL N/A 09/04/2016  Procedure: COLONOSCOPY WITH PROPOFOL;  Surgeon: Carol Ada, MD;  Location: [facility] ENDOSCOPY;  Service: Endoscopy;  Laterality: N/A; . ECTOPIC PREGNANCY SURGERY  1987 . ESOPHAGOGASTRODUODENOSCOPY N/A 07/30/2016  Procedure: ESOPHAGOGASTRODUODENOSCOPY (EGD);  Surgeon: Carol Ada, MD;  Location: [facility] ENDOSCOPY;  Service: Endoscopy;  Laterality: N/A;  Bedside . ESOPHAGOGASTRODUODENOSCOPY (EGD) WITH PROPOFOL N/A 09/04/2016  Procedure: ESOPHAGOGASTRODUODENOSCOPY (EGD) WITH PROPOFOL;  Surgeon: Carol Ada, MD;  Location: [facility] ENDOSCOPY;  Service: Endoscopy;  Laterality: N/A; . INSERTION OF DIALYSIS CATHETER Right 07/11/2014  Procedure: INSERTION OF DIALYSIS CATHETER IN RIGHT INTERNAL JUGULAR ;  Surgeon: Mal Misty, MD;  Location: [place];  Service: Vascular;  Laterality: Right; . LAPAROSCOPIC NEPHRECTOMY Right 07/25/2014  Procedure: RIGHT LAPAROSCOPIC RADICAL NEPHRECTOMY ;  Surgeon: Ardis Hughs, MD;  Location: [facility] ORS;  Service: Urology;  Laterality: Right; . PATCH  ANGIOPLASTY Right 07/11/2014  Procedure: PATCH ANGIOPLASTY OF RIGHT RADIAL ARTERY USING CEPHALIC VEIN.;  Surgeon: Mal Misty, MD;  Location: [place];  Service: Vascular;  Laterality: Right; . THROMBECTOMY W/ EMBOLECTOMY Right 07/11/2014  Procedure: THROMBECTOMY OF RIGHT RADIAL ARTERY  ;  Surgeon: Mal Misty, MD;  Location: [facility]  OR;  Service: Vascular;  Laterality: Right; HPI: 51 yo F with history of ESRD on Peritoneal Dialysis, HTN, Right renal mass, and Anemia, who was found down on the floor seizing. She was found to have a large right frontal hemorrhage with surrounding edema and mass effect, also with small SAH and signs of PRES. Pt describes subtle dysphagia and vocal quality changes following thyroid surgery in April. Subjective: pt alert, eager for POs, particularly before she starts dialysis Assessment / Plan / Recommendation CHL IP CLINICAL IMPRESSIONS 12/20/2016 Clinical Impression Pt's oropharyngeal swallow appears to be [facility], with no aspiration or penetration observed. Of note, she did not appear to have as many audible swallows or throat clearings during MBS as was observed at bedside, but when she did have throat clearing it was no associated with any airway compromise. She fairly consistently completed second swallows, particularly with thin liquids, but there were only trace residuals (mostly a small coating of the tongue/base of tongue) remaining throughout her oropharynx. Recommend that pt initiate regular textures and thin liquids. SLP f/u not needed for swallowing, but recommend MD order SLP cognitive-linguistic evaluation. SLP Visit Diagnosis Dysphagia, unspecified (R13.10) Attention and concentration deficit following -- Frontal lobe and executive function deficit following -- Impact on safety and function Mild aspiration risk   CHL IP TREATMENT RECOMMENDATION 12/20/2016 Treatment Recommendations No treatment recommended at this time   Prognosis 12/20/2016 Prognosis for Safe Diet Advancement Good  Barriers to Reach Goals -- Barriers/Prognosis Comment -- CHL IP DIET RECOMMENDATION 12/20/2016 SLP Diet Recommendations Regular solids;Thin liquid Liquid Administration via Cup;Straw Medication Administration Whole meds with liquid Compensations Slow rate;Small sips/bites Postural Changes Remain semi-upright after after feeds/meals (Comment);Seated upright at 90 degrees   CHL IP OTHER RECOMMENDATIONS 12/20/2016 Recommended Consults -- Oral Care Recommendations Oral care BID Other Recommendations --   CHL IP FOLLOW UP RECOMMENDATIONS 12/20/2016 Follow up Recommendations Other (comment)   No flowsheet data found.     CHL IP ORAL PHASE 12/20/2016 Oral Phase WFL Oral - Pudding Teaspoon -- Oral - Pudding Cup -- Oral - Honey Teaspoon -- Oral - Honey Cup -- Oral - Nectar Teaspoon -- Oral - Nectar Cup -- Oral - Nectar Straw -- Oral - Thin Teaspoon -- Oral - Thin Cup -- Oral - Thin Straw -- Oral - Puree -- Oral - Mech Soft -- Oral - Regular -- Oral - Multi-Consistency -- Oral - Pill -- Oral Phase - Comment --  CHL IP PHARYNGEAL PHASE 12/20/2016 Pharyngeal Phase WFL Pharyngeal- Pudding Teaspoon -- Pharyngeal -- Pharyngeal- Pudding Cup -- Pharyngeal -- Pharyngeal- Honey Teaspoon -- Pharyngeal -- Pharyngeal- Honey Cup -- Pharyngeal -- Pharyngeal- Nectar Teaspoon -- Pharyngeal -- Pharyngeal- Nectar Cup -- Pharyngeal -- Pharyngeal- Nectar Straw -- Pharyngeal -- Pharyngeal- Thin Teaspoon -- Pharyngeal -- Pharyngeal- Thin Cup -- Pharyngeal -- Pharyngeal- Thin Straw -- Pharyngeal -- Pharyngeal- Puree -- Pharyngeal -- Pharyngeal- Mechanical Soft -- Pharyngeal -- Pharyngeal- Regular -- Pharyngeal -- Pharyngeal- Multi-consistency -- Pharyngeal -- Pharyngeal- Pill -- Pharyngeal -- Pharyngeal Comment --  CHL IP CERVICAL ESOPHAGEAL PHASE 12/20/2016 Cervical Esophageal Phase WFL Pudding Teaspoon -- Pudding Cup -- Honey Teaspoon -- Honey Cup -- Nectar Teaspoon -- Nectar Cup -- Nectar Straw -- Thin Teaspoon -- Thin Cup -- Thin Straw -- Puree --  Mechanical Soft -- Regular -- Multi-consistency -- Pill -- Cervical Esophageal Comment -- No flowsheet data found. Germain Osgood 12/20/2016, 9:44 AM  Germain Osgood, M.A. CCC-SLP [phone]  Time Spent in minutes  25   Louellen Molder M.D on 01/02/2017 at 10:55 AM  Between 7am to 7pm - Pager - [phone]  After 7pm go to [email or web address] - password [facility]  [facility] -  Office  [phone]

## 2017-01-02 NOTE — Progress Notes (Signed)
[facility] Progress Note   Subjective: BP's down 110/65, weight's down to 53kg.  No c/o.   Objective Vitals:   01/02/17 0000 01/02/17 0400 01/02/17 0815 01/02/17 0926  BP: 102/66 101/67  111/65  Pulse: 79 66  69  Resp: 16 18  16   Temp: 97.6 F (36.4 C) 98 F (36.7 C)  97.5 F (36.4 C)  TempSrc: Oral Oral  Oral  SpO2: 98% 99%  96%  Weight:   53.6 kg (118 lb 2.7 oz)   Height:       Physical Exam General: Awake, alert, NAD Heart: R6,V8. 2/6 systolic M.  Lungs: CTAB Abdomen: Active BS. PD cath RUQ Drsg intact Extremities: No LE edema.  Dialysis Access: RUA AVG + bruit aneurysmal   Summary: Pt is a 51 y.o. yo female with ESRD who was admitted on 12/19/2016 with sz/hemiparesis- found to have right sided frontal lobe hemorrhage. Now with PRES. Seizures controlled in [place].    Assess/Plan: 1. Intracranial CVA/ hemorrhage- with L hemiparesis. Did not require surgery. PRES was also present. Was making some clinical recovery then had cardiac arrest at dialysis on 6/22. Now awaiting rehab disposition.  2. ESRD - hemodialysis was complicated by arrest so she is now back on PD. Pt refusing return to HD. Appears her family is willing to take on home PD for pt. They live near [place] and I believe they are actively trying to make arrangements for transition to another PD center closer to their home.  3. Volume/ HTN - volume better and BP's down now, will decrease BP meds and lower UF goals w/ PD 4. Cardiac arrest - while on HD 6/22 5. Anemia- history of GIB and low hgb.  Responded too well to ESA and and Hb now > 15. This is part of reason for hypertension.  Holding ESA now.  6. MBD of CKD- resumed phoslo, calcitriol and sensipar     [name] [facility] pager [phone]   01/02/2017, 11:36 AM  Additional Objective Labs: Basic Metabolic Panel:  Recent Labs Lab 12/27/16 0248  12/30/16 0525 12/31/16 0256 01/02/17 0303  NA 137  < >  134* 136 139  K 4.7  < > 4.5 3.7 3.8  CL 99*  < > 98* 98* 99*  CO2 22  < > 24 25 27   GLUCOSE 116*  < > 114* 119* 130*  BUN 60*  < > 53* 49* 47*  CREATININE 11.78*  < > 10.92* 10.53* 11.39*  CALCIUM 9.1  < > 9.7 10.0 10.6*  PHOS 9.5*  --   --   --   --   < > = values in this interval not displayed. Liver Function Tests:  Recent Labs Lab 12/27/16 0248  ALBUMIN 2.6*   No results for input(s): LIPASE, AMYLASE in the last 168 hours. CBC:  Recent Labs Lab 01/01/17 0623  WBC 10.3  HGB 16.9*  HCT 54.2*  MCV 103.2*  PLT 352   Blood Culture    Component Value Date/Time   SDES BLOOD LEFT HAND 07/30/2016 0537   SPECREQUEST BOTTLES DRAWN AEROBIC ONLY 5CC 07/30/2016 0537   CULT NO GROWTH 5 DAYS 07/30/2016 0537   REPTSTATUS 08/04/2016 FINAL 07/30/2016 0537    Cardiac Enzymes: No results for input(s): CKTOTAL, CKMB, CKMBINDEX, TROPONINI in the last 168 hours. CBG: No results for input(s): GLUCAP in the last 168 hours. Iron Studies: No results for input(s): IRON, TIBC, TRANSFERRIN, FERRITIN in the last 72 hours. @lablastinr3 @ Studies/Results: No  results found. Medications:  . amLODipine  10 mg Oral Daily  . calcitRIOL  0.25 mcg Oral Q M,W,F-1800  . calcium acetate  1,334 mg Oral TID WC  . carvedilol  12.5 mg Oral BID WC  . cinacalcet  60 mg Oral Q supper  . cloNIDine  0.1 mg Oral TID  . famotidine  20 mg Oral Daily  . feeding supplement (NEPRO CARB STEADY)  237 mL Oral BID BM  . gentamicin cream  1 application Topical Daily  . levETIRAcetam  500 mg Oral BID  . levothyroxine  88 mcg Oral QAC breakfast  . losartan  100 mg Oral QHS  . mouth rinse  15 mL Mouth Rinse BID  . multivitamin  1 tablet Oral Daily  . pantoprazole  40 mg Oral BID  . polyethylene glycol  17 g Oral Daily  . senna-docusate  1 tablet Oral BID

## 2017-01-02 NOTE — Progress Notes (Signed)
Occupational Therapy Treatment Patient Details Name: Terry Juarez MRN: [id number] DOB: Nov 26, 1965 Today's Date: 01/02/2017    History of present illness Pt is a 51 y/o female who presents s/p witnessed seizure at home with new L-sided weakness. No further seizure activity noted since admission. Imaging revealed R frontal lobe hemorrhage. Coded in hemodiaysis   OT comments  Pt. Completed bed level grooming tasks this day with focus on compensatory strategies for L hand and visual deficits during ADLs.  Pt. Motivated and able to incorporate techniques for brushing teeth and washing face.  Reports occasional double vision but states it is getting better.  Will continue to follow acutely.  Remains an excellent CIR candidate.   Follow Up Recommendations  CIR;Supervision/Assistance - 24 hour    Equipment Recommendations       Recommendations for Other Services      Precautions / Restrictions Precautions Precaution Comments: Peritoneal dialysis patient       Mobility Bed Mobility                  Transfers                      Balance                                           ADL either performed or assessed with clinical judgement   ADL Overall ADL's : Needs assistance/impaired     Grooming: Wash/dry face;Wash/dry hands;Oral care;Bed level;Cueing for compensatory techniques Grooming Details (indicate cue type and reason): hob elevated, with bed in seated position.  introduction to use of compensatory techniques with use of L hand to stabalize item ie: tooth paste while R hand un screws cap.  pt. able to return demo without error and brush teeth and wash face.                                       Vision   Vision Assessment?: Vision impaired- to be further tested in functional context Additional Comments: spoke with pt. on both sides of bed ans she was able to track and sustain attention.  states she occasionally sees double  but it has gotten better. states no difficulty with watching tv or with distance. "sometimes when i look at the tray to get something i see 2 of them".     Perception     Praxis      Cognition Arousal/Alertness: Awake/alert Behavior During Therapy: WFL for tasks assessed/performed Overall Cognitive Status: Within Functional Limits for tasks assessed                         Following Commands: Follows one step commands consistently;Follows one step commands with increased time     Problem Solving: Slow processing General Comments: pt. communicating and answering questions throughout session.  slight delay but able to effectively speak and engage.  aware of deficits and "how lucky i am".  emotional about how supportive her family has been.  tearful (happy tears) about her nieces who traveled from [place] to visit her.          Exercises     Shoulder Instructions       General Comments      Pertinent Vitals/ Pain  Home Living                                          Prior Functioning/Environment              Frequency  Min 3X/week        Progress Toward Goals  OT Goals(current goals can now be found in the care plan section)  Progress towards OT goals: Progressing toward goals     Plan Discharge plan remains appropriate    Co-evaluation                 AM-PAC PT "6 Clicks" Daily Activity     Outcome Measure   Help from another person eating meals?: A Lot Help from another person taking care of personal grooming?: A Lot Help from another person toileting, which includes using toliet, bedpan, or urinal?: Total Help from another person bathing (including washing, rinsing, drying)?: A Lot Help from another person to put on and taking off regular upper body clothing?: Total Help from another person to put on and taking off regular lower body clothing?: Total 6 Click Score: 9    End of Session    OT Visit Diagnosis:  Hemiplegia and hemiparesis;Other symptoms and signs involving cognitive function Hemiplegia - Right/Left: Left Hemiplegia - dominant/non-dominant: Non-Dominant Hemiplegia - caused by: Nontraumatic intracerebral hemorrhage   Activity Tolerance Patient tolerated treatment well   Patient Left in bed;with call bell/phone within reach;with bed alarm set;with nursing/sitter in room   Nurse Communication Other (comment) (reviewed grooming tasks that had been completed)        Time: 1030-1046 OT Time Calculation (min): 16 min  Charges: OT General Charges $OT Visit: 1 Procedure OT Treatments $Self Care/Home Management : 8-22 mins  Janice Coffin, COTA/L 01/02/2017, 11:07 AM

## 2017-01-03 LAB — TRIGLYCERIDES: Triglycerides: 109 mg/dL (ref <150)

## 2017-01-03 MED ORDER — DELFLEX-LC/4.25% DEXTROSE 483 MOSM/L IP SOLN: INTRAPERITONEAL | Status: DC

## 2017-01-03 MED ORDER — DELFLEX-LC/2.5% DEXTROSE 394 MOSM/L IP SOLN
INTRAPERITONEAL | Status: DC
Start: 2017-01-03 — End: 2017-01-03

## 2017-01-03 MED ORDER — LOSARTAN POTASSIUM 50 MG PO TABS
50.0000 mg | ORAL_TABLET | Freq: Every day | ORAL | Status: DC
  Administered 2017-01-03: 50 mg via ORAL
  Filled 2017-01-03: qty 1

## 2017-01-03 MED ORDER — DELFLEX-LC/2.5% DEXTROSE 394 MOSM/L IP SOLN: INTRAPERITONEAL | Status: DC

## 2017-01-03 MED ORDER — GENTAMICIN SULFATE 0.1 % EX CREA
1.0000 "application " | TOPICAL_CREAM | Freq: Every day | CUTANEOUS | Status: DC
  Filled 2017-01-03: qty 15

## 2017-01-03 MED ORDER — HEPARIN 1000 UNIT/ML FOR PERITONEAL DIALYSIS
INTRAMUSCULAR | Status: DC | PRN
  Filled 2017-01-03: qty 5000

## 2017-01-03 MED ORDER — HEPARIN 1000 UNIT/ML FOR PERITONEAL DIALYSIS: 500.0000 [IU] | INTRAMUSCULAR | Status: DC | PRN

## 2017-01-03 MED ORDER — HEPARIN 1000 UNIT/ML FOR PERITONEAL DIALYSIS
INTRAPERITONEAL | Status: DC | PRN
  Filled 2017-01-03: qty 5000

## 2017-01-03 NOTE — Progress Notes (Signed)
[name] Progress Note   Subjective: BP's down 110/65, weight's down to 53kg.  No c/o.   Objective Vitals:   01/02/17 2115 01/03/17 0100 01/03/17 0413 01/03/17 0500  BP: 122/71 139/73 127/79   Pulse: 70 71 76   Resp: 18 18 18    Temp: 98.4 F (36.9 C) 98.2 F (36.8 C) 98.1 F (36.7 C)   TempSrc: Oral Oral Oral   SpO2: 92% 96% 96%   Weight:    55.2 kg (121 lb 11.1 oz)  Height:       Physical Exam General: Awake, alert, NAD Heart: Y0,D9. 2/6 systolic M.  Lungs: CTAB Abdomen: Active BS. PD cath RUQ Drsg intact Extremities: No LE edema.  Dialysis Access: RUA AVG + bruit aneurysmal   Summary: Pt is a 51 y.o. yo female with ESRD who was admitted on 12/19/2016 with sz/hemiparesis- found to have right sided frontal lobe hemorrhage. Now with PRES. Seizures controlled in [place].    Assess/Plan: 1. Intracranial CVA/ hemorrhage- with L hemiparesis. Did not require surgery. PRES was also present. Was making some clinical recovery then had cardiac arrest at inpt dialysis on 6/22. Now awaiting rehab disposition.  2. ESRD - hemodialysis was complicated by arrest so she is now back on PD. Pt refusing return to HD. Per notes her family is willing to take over home PD for pt. They live near [place] and I believe they are actively trying to make arrangements for transition to another PD center closer to their home.  3. Volume/ HTN - volume down w/ higher PD UF, lowered BP meds some 4. Cardiac arrest - while on HD 6/22 5. Anemia- history of GIB and low hgb.  Responded too well to ESA and and Hb now > 15. This is part of reason for hypertension.  Holding ESA now.  6. MBD of CKD- resumed phoslo, calcitriol and sensipar  7.  Dispo - awaiting possible rehab transfer    [name] [place] pager [phone]   01/02/2017, 11:36 AM  Additional Objective Labs: Basic Metabolic Panel:  Recent Labs Lab 12/30/16 0525 12/31/16 0256 01/02/17 0303  NA  134* 136 139  K 4.5 3.7 3.8  CL 98* 98* 99*  CO2 24 25 27   GLUCOSE 114* 119* 130*  BUN 53* 49* 47*  CREATININE 10.92* 10.53* 11.39*  CALCIUM 9.7 10.0 10.6*   Liver Function Tests: No results for input(s): AST, ALT, ALKPHOS, BILITOT, PROT, ALBUMIN in the last 168 hours. No results for input(s): LIPASE, AMYLASE in the last 168 hours. CBC:  Recent Labs Lab 01/01/17 0623  WBC 10.3  HGB 16.9*  HCT 54.2*  MCV 103.2*  PLT 352   Blood Culture    Component Value Date/Time   SDES BLOOD LEFT HAND 07/30/2016 0537   SPECREQUEST BOTTLES DRAWN AEROBIC ONLY 5CC 07/30/2016 0537   CULT NO GROWTH 5 DAYS 07/30/2016 0537   REPTSTATUS 08/04/2016 FINAL 07/30/2016 0537    Cardiac Enzymes: No results for input(s): CKTOTAL, CKMB, CKMBINDEX, TROPONINI in the last 168 hours. CBG: No results for input(s): GLUCAP in the last 168 hours. Iron Studies: No results for input(s): IRON, TIBC, TRANSFERRIN, FERRITIN in the last 72 hours. @lablastinr3 @ Studies/Results: No results found. Medications: . dialysis solution 2.5% low-MG/low-CA    . dialysis solution 4.25% low-MG/low-CA     . amLODipine  5 mg Oral BID  . calcitRIOL  0.25 mcg Oral Q M,W,F-1800  . calcium acetate  1,334 mg Oral TID WC  . carvedilol  12.5 mg Oral BID WC  . cinacalcet  60 mg Oral Q supper  . famotidine  20 mg Oral Daily  . feeding supplement (NEPRO CARB STEADY)  237 mL Oral BID BM  . gentamicin cream  1 application Topical Daily  . levETIRAcetam  500 mg Oral BID  . levothyroxine  88 mcg Oral QAC breakfast  . losartan  50 mg Oral QHS  . mouth rinse  15 mL Mouth Rinse BID  . multivitamin  1 tablet Oral Daily  . pantoprazole  40 mg Oral BID  . polyethylene glycol  17 g Oral Daily  . senna-docusate  1 tablet Oral BID

## 2017-01-03 NOTE — Progress Notes (Signed)
Patient given dulcolax suppository.

## 2017-01-03 NOTE — Progress Notes (Signed)
PROGRESS NOTE                                                                                                                                                                                                             Patient Demographics:    Terry Juarez, is a 51 y.o. female, DOB - May 28, 1966, JTT:[id number]  Admit date - 12/19/2016   Admitting Physician Reyne Dumas, MD  Outpatient Primary MD for the patient is Tisovec, Fransico Him, MD  LOS - 15    Chief Complaint  Patient presents with  . Seizures       Brief Narrative   51 year old female with ESRD on peritoneal dialysis, renal cell carcinoma status post right nephrectomy, hypertension, hyperlipidemia and anemia was found down on the floor actively seizing by her roommate. She was brought to the ED with symptoms of right frontal headache and acute left-sided weakness. Head CT showed large 29 mm right frontal hemorrhage with surrounding edema and mass effect along with small subarachnoid hemorrhages and signs of PRES. CT angiogram head and neck showed no AVMs but with increase in size of the hematoma. Patient to my neurology in the ED and started on nicardipine and  Keppra. MRI of the brain showed no change in the size of right frontal hematoma and also conformed PRES.  Last head CT on 6/21 showed stable right frontal hemorrhage with evolving surrounding edema and stable 3-4 mm midline shift.  Patient transferred to hospitalist service on 6/24.   Subjective:   No overnight events. Blood pressure low normal on few occasions. Medications adjusted.  Assessment  & Plan :    Active Problems:   Intracranial hemorrhage (HCC) Right posterior frontal lobe acute hemorrhage with surrounding edema and local mass effect.  CT and MRI findings suggestive of PRES in the setting of hypertension. Also has subarachnoid hemorrhage. No vascular explanation for right frontoparietal  hematoma. Has  residual left hemiparesis. 2-D echo without cardiac source of emboli. EEG shows diffuse slowing. No antithrombotic or anticoagulation due to hemorrhage. Appreciate neurology's recommendation. Aggressive stroke risk factor management.  Follow-up with Dr Erlinda Hong in 6 weeks.    PRES (posterior reversible encephalopathy syndrome) with hypertensive emergency Blood pressure much improved. (Was low normal yesterday) Discontinued clonidine and switched amlodipine 25 mg twice a day. Reduced losartan dose.   Acute seizures Likely  triggered by intracranial hemorrhage. Continue empiric Keppra. EEG shows diffuse slowing.   Cardiac arrest on 6/22 Short cardiac arrest of 30 seconds while at hemodialysis on 6/22. Plan on no further attempt to hemodialysis due to labile blood pressure.  Acute respiratory failure with hypoxia Multifactorial secondary to hypertensive emergency, seizures and ICH Currently stable on room air.  ESRD on PD Not tolerating hemodialysis. Renal consult appreciated. Resumed Sensipar, calcitriol and PhosLo. Refused to be on HD in future.  History of renal cell carcinoma status post right nephrectomy  Polycythemia Etiology unclear. Monitor for now. Prior history of GI bleed with anemia, has responded well to ESA.   Constipation  daily MiraLAX. Added Dulcolax suppository.    Code Status : Full code  Family Communication  : None at bedside  Disposition Plan  :  Possibly CIR early next week (pending insurance approval)  Barriers For Discharge : Pending placement  Consults  :   Neurology Nephrology CIR   Procedures  :  Hemodialysis CT head CT angiogram head and neck MRI brain  DVT Prophylaxis  : SCDs  Lab Results  Component Value Date   PLT 352 01/01/2017    Antibiotics  :   Anti-infectives    None        Objective:   Vitals:   01/03/17 0100 01/03/17 0413 01/03/17 0500 01/03/17 1306  BP: 139/73 127/79  (!) 145/84  Pulse: 71 76  69    Resp: 18 18  16   Temp: 98.2 F (36.8 C) 98.1 F (36.7 C)  97.9 F (36.6 C)  TempSrc: Oral Oral  Oral  SpO2: 96% 96%  93%  Weight:   55.2 kg (121 lb 11.1 oz)   Height:        Wt Readings from Last 3 Encounters:  01/03/17 55.2 kg (121 lb 11.1 oz)  09/04/16 54.4 kg (120 lb)  08/02/16 56.9 kg (125 lb 7.1 oz)     Intake/Output Summary (Last 24 hours) at 01/03/17 1337 Last data filed at 01/03/17 1307  Gross per 24 hour  Intake              360 ml  Output                0 ml  Net              360 ml     Physical Exam Gen.: Not in distress HEENT: Moist mucosa, supple neck Chest: Bilaterally CVS: Normal S1 and S2, nondistended, nontender, PD catheter+ Musculoskeletal: Warm, no edema CNS: Left hemiparesis        Data Review:    CBC  Recent Labs Lab 01/01/17 0623  WBC 10.3  HGB 16.9*  HCT 54.2*  PLT 352  MCV 103.2*  MCH 32.2  MCHC 31.2  RDW 15.1    Chemistries   Recent Labs Lab 12/28/16 0317 12/29/16 0532 12/30/16 0525 12/31/16 0256 01/02/17 0303  NA 136 137 134* 136 139  K 4.4 3.8 4.5 3.7 3.8  CL 100* 99* 98* 98* 99*  CO2 22 24 24 25 27   GLUCOSE 113* 124* 114* 119* 130*  BUN 54* 52* 53* 49* 47*  CREATININE 10.86* 10.91* 10.92* 10.53* 11.39*  CALCIUM 9.5 9.5 9.7 10.0 10.6*  MG 2.0 1.9 1.9 1.9  --    ------------------------------------------------------------------------------------------------------------------  Recent Labs  01/03/17 0232  TRIG 109    No results found for: HGBA1C ------------------------------------------------------------------------------------------------------------------ No results for input(s): TSH, T4TOTAL, T3FREE, THYROIDAB in the last 72 hours.  Invalid input(s): FREET3 ------------------------------------------------------------------------------------------------------------------ No results for input(s): VITAMINB12, FOLATE, FERRITIN, TIBC, IRON, RETICCTPCT in the last 72 hours.  Coagulation profile No  results for input(s): INR, PROTIME in the last 168 hours.  No results for input(s): DDIMER in the last 72 hours.  Cardiac Enzymes No results for input(s): CKMB, TROPONINI, MYOGLOBIN in the last 168 hours.  Invalid input(s): CK ------------------------------------------------------------------------------------------------------------------ No results found for: BNP  Inpatient Medications  Scheduled Meds: . amLODipine  5 mg Oral BID  . calcitRIOL  0.25 mcg Oral Q M,W,F-1800  . calcium acetate  1,334 mg Oral TID WC  . carvedilol  12.5 mg Oral BID WC  . cinacalcet  60 mg Oral Q supper  . famotidine  20 mg Oral Daily  . feeding supplement (NEPRO CARB STEADY)  237 mL Oral BID BM  . gentamicin cream  1 application Topical Daily  . levETIRAcetam  500 mg Oral BID  . levothyroxine  88 mcg Oral QAC breakfast  . losartan  50 mg Oral QHS  . mouth rinse  15 mL Mouth Rinse BID  . multivitamin  1 tablet Oral Daily  . pantoprazole  40 mg Oral BID  . polyethylene glycol  17 g Oral Daily  . senna-docusate  1 tablet Oral BID   Continuous Infusions: . dialysis solution 2.5% low-MG/low-CA    . dialysis solution 4.25% low-MG/low-CA     PRN Meds:.acetaminophen, bisacodyl, dianeal solution for CAPD/CCPD with heparin, dianeal solution for CAPD/CCPD with heparin  Micro Results No results found for this or any previous visit (from the past 240 hour(s)).  Radiology Reports Ct Angio Head W Or Wo Contrast  Result Date: 12/19/2016 CLINICAL DATA:  Intraparenchymal and subarachnoid hemorrhage. EXAM: CT ANGIOGRAPHY HEAD AND NECK TECHNIQUE: Multidetector CT imaging of the head and neck was performed using the standard protocol during bolus administration of intravenous contrast. Multiplanar CT image reconstructions and MIPs were obtained to evaluate the vascular anatomy. Carotid stenosis measurements (when applicable) are obtained utilizing NASCET criteria, using the distal internal carotid diameter as the  denominator. CONTRAST:  50 cc Isovue 370 intravenous COMPARISON:  Noncontrast head CT from earlier today FINDINGS: CTA NECK FINDINGS Aortic arch: Atheromatous wall thickening that is mild. No acute finding or aneurysm. Right carotid system: Mild atheromatous plaque at the common carotid bifurcation without stenosis or ulceration. Beaded ICA consistent with fibromuscular dysplasia. No superimposed acute dissection. Left carotid system: Scattered atheromatous plaque on the distal common carotid and proximal ICA, moderate for age. No flow limiting stenosis or ulceration. Beading of the left ICA consistent with fibromuscular dysplasia. No superimposed dissection. Vertebral arteries: Proximal subclavian atherosclerosis without flow limiting stenosis. Both vertebral arteries are smooth and widely patent to the dura. Left vertebral dominance. Skeleton: No acute or aggressive finding. Other neck: Negative for mass or inflammation. Upper chest: Negative Review of the MIP images confirms the above findings CTA HEAD FINDINGS Anterior circulation: Moderate calcified plaque on the carotid siphons without flow limiting stenosis. Standard branching. There is no evidence of aneurysm or vascular malformation. No major branch occlusion or proximal flow limiting stenosis. Posterior circulation: Left dominant vertebral artery. The vertebral and basilar arteries are smooth and widely patent. Aplastic right and mildly hypoplastic left P1 segments with large posterior communicating arteries. No branch occlusion, stenosis, aneurysm, or evidence of vascular malformation. Venous sinuses: Patent on the delayed phase. Anatomic variants: Fetal type PCA circulation, especially on the right. Delayed phase: No masslike enhancement. Pending brain MRI. The right frontal parietal hematoma is larger than  before, measuring 27 x 34 x 35 mm as compared to 20 x 31 x 28 mm. Local subarachnoid hemorrhage and mild cerebral edema without shift or herniation.  Review of the MIP images confirms the above findings IMPRESSION: 1. No vascular explanation for the right frontal parietal hematoma. Dural venous sinuses are patent and there is no evidence of vascular malformation. 2. The 16 cc hematoma is mildly increased from earlier CT. Negative for shift or herniation. 3. Fibromuscular dysplasia of the bilateral cervical ICA. 4. Moderate atherosclerosis for age. Negative for flow limiting stenosis. Electronically Signed   By: Monte Fantasia M.D.   On: 12/19/2016 19:37   Ct Head Wo Contrast  Result Date: 12/24/2016 CLINICAL DATA:  Intraparenchymal hemorrhage. EXAM: CT HEAD WITHOUT CONTRAST TECHNIQUE: Contiguous axial images were obtained from the base of the skull through the vertex without intravenous contrast. COMPARISON:  Multiple prior head CTs, most recently 12/22/2016 FINDINGS: Brain: The right frontal hemorrhage is stable in size. Evolving surrounding edema is noted. 3-4 mm of midline shift is not significantly changed. No new hemorrhage is present. The subarachnoid hemorrhage is not significantly changed. Vascular: Atherosclerotic calcifications are again noted. Skull: Calvarium is intact. Sinuses/Orbits: The paranasal sinuses and mastoid air cells are clear. IMPRESSION: 1. Stable right frontal hemorrhage with evolving surrounding edema. 2. Stable 3-4 mm of midline shift. Electronically Signed   By: San Morelle M.D.   On: 12/24/2016 12:22   Ct Head Wo Contrast  Result Date: 12/22/2016 CLINICAL DATA:  Patient admitted for [place], stroke. MRI demonstrated PRES. EXAM: CT HEAD WITHOUT CONTRAST TECHNIQUE: Contiguous axial images were obtained from the base of the skull through the vertex without intravenous contrast. COMPARISON:  Multiple priors, most recent earlier today at 3:55 a.m. FINDINGS: Brain: Redemonstrated is an intraparenchymal hemorrhage, not significantly changed in size or shape, roughly 4 x 3 x 5 cm. Mild surrounding vasogenic edema is not  increased. Slight effacement RIGHT lateral ventricle. Unchanged midline shift RIGHT to LEFT, roughly 3 mm. No hydrocephalus or ventricular trapping. Effacement of the basilar cisterns redemonstrated. Vascular: No hyperdense vessel or unexpected calcification. Skull: Normal. Negative for fracture or focal lesion. Sinuses/Orbits: No acute finding. Other: None. IMPRESSION: Stable intraparenchymal hemorrhage. Subtle signs of midline shift, and slight increased pressure. But no significant worsening from priors. Electronically Signed   By: Staci Righter M.D.   On: 12/22/2016 09:47   Ct Head Wo Contrast  Result Date: 12/22/2016 CLINICAL DATA:  Follow-up examination for acute intracranial hemorrhage. EXAM: CT HEAD WITHOUT CONTRAST TECHNIQUE: Contiguous axial images were obtained from the base of the skull through the vertex without intravenous contrast. COMPARISON:  Prior CT from 12/21/2016. FINDINGS: Brain: Intraparenchymal hemorrhage centered at the high right frontal parietal region again seen. Hemorrhage overall not significantly changed measuring 4.1 x 3.3 x 5.4 cm seen greatest dimensions (previously 4.1 x 3.4 x 5.5 cm). Surrounding low-density vasogenic edema relatively with sulcal effacement relatively similar. Mass effect on the right lateral ventricle which is partially effaced relatively unchanged. Right-to-left midline shift measures approximately 3 mm, similar. No hydrocephalus or ventricular trapping. No intraventricular extension of hemorrhage. Adjacent small volume subarachnoid hemorrhage noted, similar. Adjacent extra-axial hemorrhage unchanged. No new acute intracranial hemorrhage. No evidence for acute large vessel territory infarct. Vascular: No hyperdense vessel. Scattered vascular calcifications noted within the carotid siphons. Skull: Scalp soft tissues and calvarium unchanged, and remain within normal limits. Sinuses/Orbits: Globes and orbital soft tissues within normal limits. Paranasal sinuses  and mastoid air cells remain clear. IMPRESSION: 1.  No significant interval change in posterior right frontal intraparenchymal hematoma with extra-axial extension. Similar localized edema and mass effect with 3 mm right-to-left shift. 2. No other new acute intracranial process. Electronically Signed   By: Jeannine Boga M.D.   On: 12/22/2016 04:49   Ct Head Wo Contrast  Result Date: 12/21/2016 CLINICAL DATA:  Intracranial hemorrhage. EXAM: CT HEAD WITHOUT CONTRAST TECHNIQUE: Contiguous axial images were obtained from the base of the skull through the vertex without intravenous contrast. COMPARISON:  CT head without contrast 12/20/2016 and 12/19/2016. FINDINGS: Brain: The focal hyperdense hemorrhage within the high right frontal lobe extends to the cortex. The area of hemorrhage continues to slowly increase in size. Maximal dimensions on coronal and sagittal reformats are now 4.1 x 3.4 by 5.5 cm. This compares with previous measurements of 3.8 x 8 3.3 x 5.2 cm and is significantly larger than the regional measurements. Adjacent extra-axial hemorrhage is similar the prior study. Mild edema surrounds the hemorrhage. Local mass effect is present with effacement of the sulci. Minimal midline shift is present. There is increasing effacement of the right lateral ventricle. Vascular: Atherosclerotic calcifications are present within the cavernous internal carotid arteries. There is no hyperdense vessel. Skull: The calvarium is intact. No focal lytic or blastic lesions are present. Sinuses/Orbits: The paranasal sinuses and mastoid air cells are clear. The globes and orbits are unremarkable. IMPRESSION: 1. Continued increase in size of right frontal parenchymal hemorrhage with extra-axial extension. 2. Slight increase in associated mass effect. These results were called by telephone at the time of interpretation on 12/21/2016 at 9:21 am to Dr. Rosalin Hawking , who verbally acknowledged these results. Electronically Signed    By: San Morelle M.D.   On: 12/21/2016 09:29   Ct Head Wo Contrast  Result Date: 12/20/2016 CLINICAL DATA:  Intracranial hemorrhage follow up EXAM: CT HEAD WITHOUT CONTRAST TECHNIQUE: Contiguous axial images were obtained from the base of the skull through the vertex without intravenous contrast. COMPARISON:  Brain MRI 12/19/2016 Head CT 12/19/2016 Head CTA 12/19/2016 FINDINGS: Brain: Intraparenchymal hematoma centered in the right frontal lobe now measures 3.8 x 3.4 x 3.8 cm, previously 3.7 x 3.8 by 3.0 cm. Surrounding edema is unchanged. There is subarachnoid blood over the right convexity, also unchanged. There is no midline shift. No hydrocephalus. Basal cisterns remain patent. Vascular: No hyperdense vessel or unexpected calcification. Skull: Normal visualized skull base, calvarium and extracranial soft tissues. Sinuses/Orbits: No sinus fluid levels or advanced mucosal thickening. No mastoid effusion. Normal orbits. IMPRESSION: 1. Slightly increased size of right frontal intraparenchymal hematoma with unchanged right convexity subarachnoid blood, compared to head CTA performed 12/19/2016. Compared to the initial head CT from this presentation, obtained at 5:02 p.m. on 12/19/2016, the hematoma has increased in size. 2. No new hemorrhage, hydrocephalus or mass effect. Electronically Signed   By: Ulyses Jarred M.D.   On: 12/20/2016 06:29   Ct Head Wo Contrast  Result Date: 12/19/2016 CLINICAL DATA:  51 y/o  F; seizure. EXAM: CT HEAD WITHOUT CONTRAST CT CERVICAL SPINE WITHOUT CONTRAST TECHNIQUE: Multidetector CT imaging of the head and cervical spine was performed following the standard protocol without intravenous contrast. Multiplanar CT image reconstructions of the cervical spine were also generated. COMPARISON:  02/11/2015 CT of the head. FINDINGS: CT HEAD FINDINGS Brain: Right posterior frontal lobe acute hemorrhage measuring 22 x 29 x 21 mm (AP x ML x CC series 3 image 26 and series 5, image  34). Small volume of subarachnoid hemorrhage with overlying  the right frontal convexity and along the falx. There is a small surrounding region of hypoattenuation in the brain compatible with edema with local mass effect effacing sulci. No significant midline shift. No evidence for herniation. No large territory acute stroke identified. There are foci of hypoattenuation within subcortical white matter in the bilateral parietal lobes and occipital lobes. The distribution is suggestive of PRES. Vascular: Extensive calcific atherosclerosis of the carotid siphons. Skull: Normal. Negative for fracture or focal lesion. Sinuses/Orbits: No acute finding. Other: None. CT CERVICAL SPINE FINDINGS Alignment: Normal. Skull base and vertebrae: No acute fracture. No primary bone lesion or focal pathologic process. Soft tissues and spinal canal: No prevertebral fluid or swelling. No visible canal hematoma. Disc levels: Straightening of cervical lordosis and discogenic degenerative changes with mild disc space narrowing and marginal osteophytes from the C3 through C5 levels. Upper chest: Negative. Other: Negative appear IMPRESSION: 1. Right posterior frontal lobe acute hemorrhage measuring up to 29 mm with small area of surrounding edema and local mass effect. No significant midline shift or herniation. Follow-up to ensure resolution is recommended to exclude an underlying mass. 2. Small volume of subarachnoid hemorrhage overlying the right frontal convexity. 3. Hypoattenuation within subcortical white matter and bilateral parietal and occipital lobes. Findings are suggestive of PRES in the setting of hypertension. This can be further characterized with MRI of the brain. 4. No acute fracture or dislocation of the cervical spine. These results were called by telephone at the time of interpretation on 12/19/2016 at 5:12 pm to Dr. Lajean Saver , who verbally acknowledged these results. Electronically Signed   By: Kristine Garbe M.D.   On: 12/19/2016 17:23   Ct Angio Neck W And/or Wo Contrast  Result Date: 12/19/2016 CLINICAL DATA:  Intraparenchymal and subarachnoid hemorrhage. EXAM: CT ANGIOGRAPHY HEAD AND NECK TECHNIQUE: Multidetector CT imaging of the head and neck was performed using the standard protocol during bolus administration of intravenous contrast. Multiplanar CT image reconstructions and MIPs were obtained to evaluate the vascular anatomy. Carotid stenosis measurements (when applicable) are obtained utilizing NASCET criteria, using the distal internal carotid diameter as the denominator. CONTRAST:  50 cc Isovue 370 intravenous COMPARISON:  Noncontrast head CT from earlier today FINDINGS: CTA NECK FINDINGS Aortic arch: Atheromatous wall thickening that is mild. No acute finding or aneurysm. Right carotid system: Mild atheromatous plaque at the common carotid bifurcation without stenosis or ulceration. Beaded ICA consistent with fibromuscular dysplasia. No superimposed acute dissection. Left carotid system: Scattered atheromatous plaque on the distal common carotid and proximal ICA, moderate for age. No flow limiting stenosis or ulceration. Beading of the left ICA consistent with fibromuscular dysplasia. No superimposed dissection. Vertebral arteries: Proximal subclavian atherosclerosis without flow limiting stenosis. Both vertebral arteries are smooth and widely patent to the dura. Left vertebral dominance. Skeleton: No acute or aggressive finding. Other neck: Negative for mass or inflammation. Upper chest: Negative Review of the MIP images confirms the above findings CTA HEAD FINDINGS Anterior circulation: Moderate calcified plaque on the carotid siphons without flow limiting stenosis. Standard branching. There is no evidence of aneurysm or vascular malformation. No major branch occlusion or proximal flow limiting stenosis. Posterior circulation: Left dominant vertebral artery. The vertebral and basilar  arteries are smooth and widely patent. Aplastic right and mildly hypoplastic left P1 segments with large posterior communicating arteries. No branch occlusion, stenosis, aneurysm, or evidence of vascular malformation. Venous sinuses: Patent on the delayed phase. Anatomic variants: Fetal type PCA circulation, especially on the right. Delayed phase:  No masslike enhancement. Pending brain MRI. The right frontal parietal hematoma is larger than before, measuring 27 x 34 x 35 mm as compared to 20 x 31 x 28 mm. Local subarachnoid hemorrhage and mild cerebral edema without shift or herniation. Review of the MIP images confirms the above findings IMPRESSION: 1. No vascular explanation for the right frontal parietal hematoma. Dural venous sinuses are patent and there is no evidence of vascular malformation. 2. The 16 cc hematoma is mildly increased from earlier CT. Negative for shift or herniation. 3. Fibromuscular dysplasia of the bilateral cervical ICA. 4. Moderate atherosclerosis for age. Negative for flow limiting stenosis. Electronically Signed   By: Monte Fantasia M.D.   On: 12/19/2016 19:37   Ct Cervical Spine Wo Contrast  Result Date: 12/19/2016 CLINICAL DATA:  51 y/o  F; seizure. EXAM: CT HEAD WITHOUT CONTRAST CT CERVICAL SPINE WITHOUT CONTRAST TECHNIQUE: Multidetector CT imaging of the head and cervical spine was performed following the standard protocol without intravenous contrast. Multiplanar CT image reconstructions of the cervical spine were also generated. COMPARISON:  02/11/2015 CT of the head. FINDINGS: CT HEAD FINDINGS Brain: Right posterior frontal lobe acute hemorrhage measuring 22 x 29 x 21 mm (AP x ML x CC series 3 image 26 and series 5, image 34). Small volume of subarachnoid hemorrhage with overlying the right frontal convexity and along the falx. There is a small surrounding region of hypoattenuation in the brain compatible with edema with local mass effect effacing sulci. No significant  midline shift. No evidence for herniation. No large territory acute stroke identified. There are foci of hypoattenuation within subcortical white matter in the bilateral parietal lobes and occipital lobes. The distribution is suggestive of PRES. Vascular: Extensive calcific atherosclerosis of the carotid siphons. Skull: Normal. Negative for fracture or focal lesion. Sinuses/Orbits: No acute finding. Other: None. CT CERVICAL SPINE FINDINGS Alignment: Normal. Skull base and vertebrae: No acute fracture. No primary bone lesion or focal pathologic process. Soft tissues and spinal canal: No prevertebral fluid or swelling. No visible canal hematoma. Disc levels: Straightening of cervical lordosis and discogenic degenerative changes with mild disc space narrowing and marginal osteophytes from the C3 through C5 levels. Upper chest: Negative. Other: Negative appear IMPRESSION: 1. Right posterior frontal lobe acute hemorrhage measuring up to 29 mm with small area of surrounding edema and local mass effect. No significant midline shift or herniation. Follow-up to ensure resolution is recommended to exclude an underlying mass. 2. Small volume of subarachnoid hemorrhage overlying the right frontal convexity. 3. Hypoattenuation within subcortical white matter and bilateral parietal and occipital lobes. Findings are suggestive of PRES in the setting of hypertension. This can be further characterized with MRI of the brain. 4. No acute fracture or dislocation of the cervical spine. These results were called by telephone at the time of interpretation on 12/19/2016 at 5:12 pm to Dr. Lajean Saver , who verbally acknowledged these results. Electronically Signed   By: Kristine Garbe M.D.   On: 12/19/2016 17:23   Mr Brain Wo Contrast  Result Date: 12/19/2016 CLINICAL DATA:  Seizure and altered mental status. EXAM: MRI HEAD WITHOUT CONTRAST TECHNIQUE: Multiplanar, multiecho pulse sequences of the brain and surrounding  structures were obtained without intravenous contrast. COMPARISON:  CTA head neck from earlier the same day FINDINGS: Brain: Acute parenchymal hematoma in the posterior right frontal lobe is size stable from CT 60 minutes prior. There is subarachnoid FLAIR signal hyperintensity along the right more than left convexity. On the right there  is associated susceptibility on gradient imaging correlating with subarachnoid hemorrhage by CT. There is bilateral cortical and subcortical T2 hyperintensity within the predominantly high and posterior cerebral convexities, also seen along the left putamen. No hydrocephalus. No primary infarct findings. Vascular: Major flow voids are preserved. Skull and upper cervical spine: Negative for marrow lesion Sinuses/Orbits: Negative Other: These results were called by telephone at the time of interpretation on 12/19/2016 at 8:30 pm to Dr. Lajean Saver , who verbally acknowledged these results. IMPRESSION: 1. Findings of posterior reversible encephalopathy syndrome. 2. Size stable posterior right frontal hematoma compared to CTA 1 hour prior. Electronically Signed   By: Monte Fantasia M.D.   On: 12/19/2016 20:35   Dg Chest Port 1 View  Result Date: 12/25/2016 CLINICAL DATA:  Intubation. EXAM: PORTABLE CHEST 1 VIEW COMPARISON:  07/29/2016 . FINDINGS: Endotracheal tube noted with its tip 4.1 cm above the carina. Heart size normal. No focal infiltrate. Prominent nipple shadows again noted. No pleural effusion or pneumothorax. IMPRESSION: 1.  Endotracheal tube tip noted 4.1 cm above the carina. 2. No acute cardiopulmonary disease. Electronically Signed   By: Marcello Moores  Register   On: 12/25/2016 07:14   Dg Swallowing Func-speech Pathology  Result Date: 12/20/2016 Objective Swallowing Evaluation: Type of Study: MBS-Modified Barium Swallow Study Patient Details Name: Terry Juarez MRN: [id number] Date of Birth: Dec 30, 1965 Today's Date: 12/20/2016 Time: SLP Start Time (ACUTE ONLY): 0914-SLP  Stop Time (ACUTE ONLY): 9163 SLP Time Calculation (min) (ACUTE ONLY): 13 min Past Medical History: Past Medical History: Diagnosis Date . Anemia  . Bruises easily  . Dialysis patient ([place])  . ESRD on dialysis ([place])  . Hyperlipidemia  . Hypertension  . Renal disorder   rt renal mass / < functioning of left kidney - being prepared for possible dialysis . Right renal mass  Past Surgical History: Past Surgical History: Procedure Laterality Date . AV FISTULA PLACEMENT Right 07/11/2014  Procedure: ARTERIOVENOUS (AV) FISTULA CREATION RIGHT ARM BRACHIO-CEPHALIC WITH ATTEMPTED RADIO-CEPHALIC (AV) FISTULA;  Surgeon: Mal Misty, MD;  Location: [place];  Service: Vascular;  Laterality: Right; . COLONOSCOPY WITH PROPOFOL N/A 09/04/2016  Procedure: COLONOSCOPY WITH PROPOFOL;  Surgeon: Carol Ada, MD;  Location: [facility];  Service: Endoscopy;  Laterality: N/A; . ECTOPIC PREGNANCY SURGERY  1987 . ESOPHAGOGASTRODUODENOSCOPY N/A 07/30/2016  Procedure: ESOPHAGOGASTRODUODENOSCOPY (EGD);  Surgeon: Carol Ada, MD;  Location: [facility] ENDOSCOPY;  Service: Endoscopy;  Laterality: N/A;  Bedside . ESOPHAGOGASTRODUODENOSCOPY (EGD) WITH PROPOFOL N/A 09/04/2016  Procedure: ESOPHAGOGASTRODUODENOSCOPY (EGD) WITH PROPOFOL;  Surgeon: Carol Ada, MD;  Location: [facility];  Service: Endoscopy;  Laterality: N/A; . INSERTION OF DIALYSIS CATHETER Right 07/11/2014  Procedure: INSERTION OF DIALYSIS CATHETER IN RIGHT INTERNAL JUGULAR ;  Surgeon: Mal Misty, MD;  Location: [place];  Service: Vascular;  Laterality: Right; . LAPAROSCOPIC NEPHRECTOMY Right 07/25/2014  Procedure: RIGHT LAPAROSCOPIC RADICAL NEPHRECTOMY ;  Surgeon: Ardis Hughs, MD;  Location: [facility];  Service: Urology;  Laterality: Right; . PATCH ANGIOPLASTY Right 07/11/2014  Procedure: PATCH ANGIOPLASTY OF RIGHT RADIAL ARTERY USING CEPHALIC VEIN.;  Surgeon: Mal Misty, MD;  Location: [place];  Service: Vascular;  Laterality: Right; . THROMBECTOMY W/ EMBOLECTOMY Right 07/11/2014  Procedure:  THROMBECTOMY OF RIGHT RADIAL ARTERY  ;  Surgeon: Mal Misty, MD;  Location: [facility];  Service: Vascular;  Laterality: Right; HPI: 51 yo F with history of ESRD on Peritoneal Dialysis, HTN, Right renal mass, and Anemia, who was found down on the floor seizing. She was found to have  a large right frontal hemorrhage with surrounding edema and mass effect, also with small SAH and signs of PRES. Pt describes subtle dysphagia and vocal quality changes following thyroid surgery in April. Subjective: pt alert, eager for POs, particularly before she starts dialysis Assessment / Plan / Recommendation CHL IP CLINICAL IMPRESSIONS 12/20/2016 Clinical Impression Pt's oropharyngeal swallow appears to be [facility], with no aspiration or penetration observed. Of note, she did not appear to have as many audible swallows or throat clearings during MBS as was observed at bedside, but when she did have throat clearing it was no associated with any airway compromise. She fairly consistently completed second swallows, particularly with thin liquids, but there were only trace residuals (mostly a small coating of the tongue/base of tongue) remaining throughout her oropharynx. Recommend that pt initiate regular textures and thin liquids. SLP f/u not needed for swallowing, but recommend MD order SLP cognitive-linguistic evaluation. SLP Visit Diagnosis Dysphagia, unspecified (R13.10) Attention and concentration deficit following -- Frontal lobe and executive function deficit following -- Impact on safety and function Mild aspiration risk   CHL IP TREATMENT RECOMMENDATION 12/20/2016 Treatment Recommendations No treatment recommended at this time   Prognosis 12/20/2016 Prognosis for Safe Diet Advancement Good Barriers to Reach Goals -- Barriers/Prognosis Comment -- CHL IP DIET RECOMMENDATION 12/20/2016 SLP Diet Recommendations Regular solids;Thin liquid Liquid Administration via Cup;Straw Medication Administration Whole meds with liquid Compensations  Slow rate;Small sips/bites Postural Changes Remain semi-upright after after feeds/meals (Comment);Seated upright at 90 degrees   CHL IP OTHER RECOMMENDATIONS 12/20/2016 Recommended Consults -- Oral Care Recommendations Oral care BID Other Recommendations --   CHL IP FOLLOW UP RECOMMENDATIONS 12/20/2016 Follow up Recommendations Other (comment)   No flowsheet data found.     CHL IP ORAL PHASE 12/20/2016 Oral Phase WFL Oral - Pudding Teaspoon -- Oral - Pudding Cup -- Oral - Honey Teaspoon -- Oral - Honey Cup -- Oral - Nectar Teaspoon -- Oral - Nectar Cup -- Oral - Nectar Straw -- Oral - Thin Teaspoon -- Oral - Thin Cup -- Oral - Thin Straw -- Oral - Puree -- Oral - Mech Soft -- Oral - Regular -- Oral - Multi-Consistency -- Oral - Pill -- Oral Phase - Comment --  CHL IP PHARYNGEAL PHASE 12/20/2016 Pharyngeal Phase WFL Pharyngeal- Pudding Teaspoon -- Pharyngeal -- Pharyngeal- Pudding Cup -- Pharyngeal -- Pharyngeal- Honey Teaspoon -- Pharyngeal -- Pharyngeal- Honey Cup -- Pharyngeal -- Pharyngeal- Nectar Teaspoon -- Pharyngeal -- Pharyngeal- Nectar Cup -- Pharyngeal -- Pharyngeal- Nectar Straw -- Pharyngeal -- Pharyngeal- Thin Teaspoon -- Pharyngeal -- Pharyngeal- Thin Cup -- Pharyngeal -- Pharyngeal- Thin Straw -- Pharyngeal -- Pharyngeal- Puree -- Pharyngeal -- Pharyngeal- Mechanical Soft -- Pharyngeal -- Pharyngeal- Regular -- Pharyngeal -- Pharyngeal- Multi-consistency -- Pharyngeal -- Pharyngeal- Pill -- Pharyngeal -- Pharyngeal Comment --  CHL IP CERVICAL ESOPHAGEAL PHASE 12/20/2016 Cervical Esophageal Phase WFL Pudding Teaspoon -- Pudding Cup -- Honey Teaspoon -- Honey Cup -- Nectar Teaspoon -- Nectar Cup -- Nectar Straw -- Thin Teaspoon -- Thin Cup -- Thin Straw -- Puree -- Mechanical Soft -- Regular -- Multi-consistency -- Pill -- Cervical Esophageal Comment -- No flowsheet data found. Germain Osgood 12/20/2016, 9:44 AM  Germain Osgood, M.A. CCC-SLP [phone]              Time Spent in minutes   25   Louellen Molder M.D on 01/03/2017 at 1:37 PM  Between 7am to 7pm - Pager - [phone]  After 7pm go to [email or web address] - password [email or web address]  [facility] -  Office  [phone]

## 2017-01-04 ENCOUNTER — Encounter (HOSPITAL_COMMUNITY): Payer: Self-pay

## 2017-01-04 ENCOUNTER — Inpatient Hospital Stay (HOSPITAL_COMMUNITY)
Admission: RE | Admit: 2017-01-04 | Discharge: 2017-02-01 | DRG: 091 | Disposition: A | Discharge status: Home Health | Payer: BLUE CROSS/BLUE SHIELD | Source: Intra-hospital | Attending: Physical Medicine & Rehabilitation | Admitting: Physical Medicine & Rehabilitation

## 2017-01-04 DIAGNOSIS — R54 Age-related physical debility: Secondary | ICD-10-CM | POA: Diagnosis present

## 2017-01-04 DIAGNOSIS — E039 Hypothyroidism, unspecified: Secondary | ICD-10-CM | POA: Diagnosis present

## 2017-01-04 DIAGNOSIS — Z79899 Other long term (current) drug therapy: Secondary | ICD-10-CM | POA: Diagnosis not present

## 2017-01-04 DIAGNOSIS — Z8674 Personal history of sudden cardiac arrest: Secondary | ICD-10-CM

## 2017-01-04 DIAGNOSIS — R739 Hyperglycemia, unspecified: Secondary | ICD-10-CM | POA: Diagnosis present

## 2017-01-04 DIAGNOSIS — K5901 Slow transit constipation: Secondary | ICD-10-CM

## 2017-01-04 DIAGNOSIS — I69398 Other sequelae of cerebral infarction: Secondary | ICD-10-CM | POA: Diagnosis not present

## 2017-01-04 DIAGNOSIS — W19XXXA Unspecified fall, initial encounter: Secondary | ICD-10-CM | POA: Diagnosis not present

## 2017-01-04 DIAGNOSIS — I69154 Hemiplegia and hemiparesis following nontraumatic intracerebral hemorrhage affecting left non-dominant side: Secondary | ICD-10-CM

## 2017-01-04 DIAGNOSIS — G8114 Spastic hemiplegia affecting left nondominant side: Secondary | ICD-10-CM

## 2017-01-04 DIAGNOSIS — K5900 Constipation, unspecified: Secondary | ICD-10-CM | POA: Diagnosis present

## 2017-01-04 DIAGNOSIS — M21372 Foot drop, left foot: Secondary | ICD-10-CM | POA: Diagnosis present

## 2017-01-04 DIAGNOSIS — I10 Essential (primary) hypertension: Secondary | ICD-10-CM | POA: Diagnosis not present

## 2017-01-04 DIAGNOSIS — Z905 Acquired absence of kidney: Secondary | ICD-10-CM | POA: Diagnosis not present

## 2017-01-04 DIAGNOSIS — I629 Nontraumatic intracranial hemorrhage, unspecified: Secondary | ICD-10-CM | POA: Diagnosis not present

## 2017-01-04 DIAGNOSIS — S066XAA Traumatic subarachnoid hemorrhage with loss of consciousness status unknown, initial encounter: Secondary | ICD-10-CM

## 2017-01-04 DIAGNOSIS — E785 Hyperlipidemia, unspecified: Secondary | ICD-10-CM | POA: Diagnosis present

## 2017-01-04 DIAGNOSIS — Z8249 Family history of ischemic heart disease and other diseases of the circulatory system: Secondary | ICD-10-CM

## 2017-01-04 DIAGNOSIS — Z992 Dependence on renal dialysis: Secondary | ICD-10-CM

## 2017-01-04 DIAGNOSIS — N186 End stage renal disease: Secondary | ICD-10-CM | POA: Diagnosis present

## 2017-01-04 DIAGNOSIS — I161 Hypertensive emergency: Secondary | ICD-10-CM | POA: Diagnosis present

## 2017-01-04 DIAGNOSIS — M25512 Pain in left shoulder: Secondary | ICD-10-CM | POA: Diagnosis not present

## 2017-01-04 DIAGNOSIS — D72828 Other elevated white blood cell count: Secondary | ICD-10-CM | POA: Diagnosis not present

## 2017-01-04 DIAGNOSIS — D72829 Elevated white blood cell count, unspecified: Secondary | ICD-10-CM | POA: Diagnosis present

## 2017-01-04 DIAGNOSIS — G8194 Hemiplegia, unspecified affecting left nondominant side: Secondary | ICD-10-CM | POA: Diagnosis present

## 2017-01-04 DIAGNOSIS — M7989 Other specified soft tissue disorders: Secondary | ICD-10-CM | POA: Diagnosis not present

## 2017-01-04 DIAGNOSIS — I6783 Posterior reversible encephalopathy syndrome: Secondary | ICD-10-CM | POA: Diagnosis present

## 2017-01-04 DIAGNOSIS — Z85528 Personal history of other malignant neoplasm of kidney: Secondary | ICD-10-CM

## 2017-01-04 DIAGNOSIS — Z681 Body mass index (BMI) 19 or less, adult: Secondary | ICD-10-CM

## 2017-01-04 DIAGNOSIS — I120 Hypertensive chronic kidney disease with stage 5 chronic kidney disease or end stage renal disease: Secondary | ICD-10-CM | POA: Diagnosis present

## 2017-01-04 DIAGNOSIS — S066X0A Traumatic subarachnoid hemorrhage without loss of consciousness, initial encounter: Secondary | ICD-10-CM

## 2017-01-04 DIAGNOSIS — Z298 Encounter for other specified prophylactic measures: Secondary | ICD-10-CM | POA: Diagnosis not present

## 2017-01-04 DIAGNOSIS — F329 Major depressive disorder, single episode, unspecified: Secondary | ICD-10-CM | POA: Diagnosis present

## 2017-01-04 DIAGNOSIS — Z888 Allergy status to other drugs, medicaments and biological substances status: Secondary | ICD-10-CM

## 2017-01-04 DIAGNOSIS — R2689 Other abnormalities of gait and mobility: Principal | ICD-10-CM | POA: Diagnosis present

## 2017-01-04 DIAGNOSIS — S066X9A Traumatic subarachnoid hemorrhage with loss of consciousness of unspecified duration, initial encounter: Secondary | ICD-10-CM

## 2017-01-04 DIAGNOSIS — D649 Anemia, unspecified: Secondary | ICD-10-CM | POA: Diagnosis present

## 2017-01-04 DIAGNOSIS — I674 Hypertensive encephalopathy: Secondary | ICD-10-CM

## 2017-01-04 LAB — BASIC METABOLIC PANEL
Anion gap: 13 (ref 5–15)
BUN: 55 mg/dL — ABNORMAL HIGH (ref 6–20)
CALCIUM: 10.5 mg/dL — AB (ref 8.9–10.3)
CO2: 27 mmol/L (ref 22–32)
CREATININE: 12.59 mg/dL — AB (ref 0.44–1.00)
Chloride: 96 mmol/L — ABNORMAL LOW (ref 101–111)
GFR calc Af Amer: 3 mL/min — ABNORMAL LOW (ref >60)
GFR calc non Af Amer: 3 mL/min — ABNORMAL LOW (ref >60)
GLUCOSE: 112 mg/dL — AB (ref 65–99)
Potassium: 3.7 mmol/L (ref 3.5–5.1)
Sodium: 136 mmol/L (ref 135–145)

## 2017-01-04 LAB — CBC
HCT: 54.3 % — ABNORMAL HIGH (ref 36.0–46.0)
HEMOGLOBIN: 17.2 g/dL — AB (ref 12.0–15.0)
MCH: 32.6 pg (ref 26.0–34.0)
MCHC: 31.7 g/dL (ref 30.0–36.0)
MCV: 103 fL — ABNORMAL HIGH (ref 78.0–100.0)
Platelets: 313 10*3/uL (ref 150–400)
RBC: 5.27 MIL/uL — AB (ref 3.87–5.11)
RDW: 14.9 % (ref 11.5–15.5)
WBC: 14.3 10*3/uL — ABNORMAL HIGH (ref 4.0–10.5)

## 2017-01-04 LAB — RENAL FUNCTION PANEL
ALBUMIN: 3 g/dL — AB (ref 3.5–5.0)
ANION GAP: 16 — AB (ref 5–15)
BUN: 55 mg/dL — ABNORMAL HIGH (ref 6–20)
CALCIUM: 10.9 mg/dL — AB (ref 8.9–10.3)
CO2: 25 mmol/L (ref 22–32)
Chloride: 94 mmol/L — ABNORMAL LOW (ref 101–111)
Creatinine, Ser: 12.13 mg/dL — ABNORMAL HIGH (ref 0.44–1.00)
GFR, EST AFRICAN AMERICAN: 4 mL/min — AB (ref >60)
GFR, EST NON AFRICAN AMERICAN: 3 mL/min — AB (ref >60)
Glucose, Bld: 117 mg/dL — ABNORMAL HIGH (ref 65–99)
PHOSPHORUS: 4 mg/dL (ref 2.5–4.6)
Potassium: 4.1 mmol/L (ref 3.5–5.1)
SODIUM: 135 mmol/L (ref 135–145)

## 2017-01-04 MED ORDER — DELFLEX-LC/2.5% DEXTROSE 394 MOSM/L IP SOLN: INTRAPERITONEAL | Status: DC

## 2017-01-04 MED ORDER — RENA-VITE PO TABS
1.0000 | ORAL_TABLET | Freq: Every day | ORAL | Status: DC
  Administered 2017-01-05 – 2017-02-01 (×28): 1 via ORAL
  Filled 2017-01-04 (×28): qty 1

## 2017-01-04 MED ORDER — PANTOPRAZOLE SODIUM 40 MG PO TBEC
40.0000 mg | DELAYED_RELEASE_TABLET | Freq: Two times a day (BID) | ORAL | Status: DC
  Administered 2017-01-04 – 2017-02-01 (×56): 40 mg via ORAL
  Filled 2017-01-04 (×57): qty 1

## 2017-01-04 MED ORDER — BISACODYL 10 MG RE SUPP
10.0000 mg | Freq: Every day | RECTAL | Status: DC | PRN
  Filled 2017-01-04 (×2): qty 1

## 2017-01-04 MED ORDER — ACETAMINOPHEN 325 MG PO TABS
650.0000 mg | ORAL_TABLET | Freq: Four times a day (QID) | ORAL | Status: DC | PRN
Start: 2017-01-04 — End: 2017-02-01
  Administered 2017-01-08 – 2017-01-10 (×2): 650 mg via ORAL
  Filled 2017-01-04 (×3): qty 2

## 2017-01-04 MED ORDER — AMLODIPINE BESYLATE 5 MG PO TABS: 5.0000 mg | ORAL_TABLET | Freq: Two times a day (BID) | ORAL | 0 refills | Status: DC

## 2017-01-04 MED ORDER — SORBITOL 70 % SOLN
30.0000 mL | Freq: Every day | Status: DC | PRN
  Filled 2017-01-04: qty 30

## 2017-01-04 MED ORDER — HEPARIN 1000 UNIT/ML FOR PERITONEAL DIALYSIS
INTRAPERITONEAL | Status: DC | PRN
  Filled 2017-01-04: qty 5000

## 2017-01-04 MED ORDER — LIDOCAINE HCL (PF) 1 % IJ SOLN: 5.0000 mL | INTRAMUSCULAR | Status: DC | PRN

## 2017-01-04 MED ORDER — NEPRO/CARBSTEADY PO LIQD
237.0000 mL | Freq: Two times a day (BID) | ORAL | Status: DC
Start: 2017-01-05 — End: 2017-01-25
  Administered 2017-01-08 – 2017-01-12 (×4): 237 mL via ORAL

## 2017-01-04 MED ORDER — SODIUM CHLORIDE 0.9 % IV SOLN: 100.0000 mL | INTRAVENOUS | Status: DC | PRN

## 2017-01-04 MED ORDER — LEVETIRACETAM 500 MG PO TABS: 500.0000 mg | ORAL_TABLET | Freq: Two times a day (BID) | ORAL | 0 refills | Status: DC

## 2017-01-04 MED ORDER — NEPRO/CARBSTEADY PO LIQD: 237.0000 mL | Freq: Two times a day (BID) | ORAL | 0 refills | Status: DC

## 2017-01-04 MED ORDER — POLYETHYLENE GLYCOL 3350 17 G PO PACK
17.0000 g | PACK | Freq: Every day | ORAL | Status: DC
  Administered 2017-01-05 – 2017-01-12 (×7): 17 g via ORAL
  Filled 2017-01-04 (×23): qty 1

## 2017-01-04 MED ORDER — PENTAFLUOROPROP-TETRAFLUOROETH EX AERO: 1.0000 "application " | INHALATION_SPRAY | CUTANEOUS | Status: DC | PRN

## 2017-01-04 MED ORDER — LEVOTHYROXINE SODIUM 88 MCG PO TABS
88.0000 ug | ORAL_TABLET | Freq: Every day | ORAL | Status: DC
  Administered 2017-01-05 – 2017-02-01 (×28): 88 ug via ORAL
  Filled 2017-01-04 (×28): qty 1

## 2017-01-04 MED ORDER — LEVETIRACETAM 500 MG PO TABS
500.0000 mg | ORAL_TABLET | Freq: Two times a day (BID) | ORAL | Status: DC
  Administered 2017-01-04 – 2017-02-01 (×56): 500 mg via ORAL
  Filled 2017-01-04 (×57): qty 1

## 2017-01-04 MED ORDER — FAMOTIDINE 20 MG PO TABS
20.0000 mg | ORAL_TABLET | Freq: Every day | ORAL | Status: DC
  Administered 2017-01-05 – 2017-02-01 (×28): 20 mg via ORAL
  Filled 2017-01-04 (×30): qty 1

## 2017-01-04 MED ORDER — HEPARIN SODIUM (PORCINE) 1000 UNIT/ML DIALYSIS
1000.0000 [IU] | INTRAMUSCULAR | Status: DC | PRN
  Filled 2017-01-04: qty 1

## 2017-01-04 MED ORDER — LOSARTAN POTASSIUM 50 MG PO TABS
50.0000 mg | ORAL_TABLET | Freq: Every day | ORAL | Status: DC
  Administered 2017-01-04: 50 mg via ORAL
  Filled 2017-01-04 (×3): qty 1

## 2017-01-04 MED ORDER — POLYETHYLENE GLYCOL 3350 17 G PO PACK: 17.0000 g | PACK | Freq: Every day | ORAL | 0 refills | Status: DC

## 2017-01-04 MED ORDER — LIDOCAINE-PRILOCAINE 2.5-2.5 % EX CREA: 1.0000 "application " | TOPICAL_CREAM | CUTANEOUS | Status: DC | PRN

## 2017-01-04 MED ORDER — SENNOSIDES-DOCUSATE SODIUM 8.6-50 MG PO TABS: 1.0000 | ORAL_TABLET | Freq: Two times a day (BID) | ORAL | 0 refills | Status: DC

## 2017-01-04 MED ORDER — LOSARTAN POTASSIUM 100 MG PO TABS: 50.0000 mg | ORAL_TABLET | Freq: Every day | ORAL | 0 refills | Status: DC

## 2017-01-04 MED ORDER — AMLODIPINE BESYLATE 5 MG PO TABS
5.0000 mg | ORAL_TABLET | Freq: Two times a day (BID) | ORAL | Status: DC
  Administered 2017-01-04 – 2017-01-06 (×3): 5 mg via ORAL
  Filled 2017-01-04 (×4): qty 1

## 2017-01-04 MED ORDER — ALTEPLASE 2 MG IJ SOLR
2.0000 mg | Freq: Once | INTRAMUSCULAR | Status: DC | PRN
  Filled 2017-01-04: qty 2

## 2017-01-04 MED ORDER — SENNOSIDES-DOCUSATE SODIUM 8.6-50 MG PO TABS
1.0000 | ORAL_TABLET | Freq: Two times a day (BID) | ORAL | Status: DC
  Administered 2017-01-04 – 2017-01-12 (×17): 1 via ORAL
  Filled 2017-01-04 (×17): qty 1

## 2017-01-04 MED ORDER — CALCIUM ACETATE (PHOS BINDER) 667 MG PO CAPS
1334.0000 mg | ORAL_CAPSULE | Freq: Three times a day (TID) | ORAL | Status: DC
  Administered 2017-01-05: 1334 mg via ORAL
  Filled 2017-01-04: qty 2

## 2017-01-04 MED ORDER — DELFLEX-LC/4.25% DEXTROSE 483 MOSM/L IP SOLN: INTRAPERITONEAL | Status: DC

## 2017-01-04 MED ORDER — CINACALCET HCL 30 MG PO TABS
60.0000 mg | ORAL_TABLET | Freq: Every day | ORAL | Status: DC
  Administered 2017-01-05 – 2017-01-11 (×7): 60 mg via ORAL
  Filled 2017-01-04 (×8): qty 2

## 2017-01-04 MED ORDER — CARVEDILOL 12.5 MG PO TABS
12.5000 mg | ORAL_TABLET | Freq: Two times a day (BID) | ORAL | Status: DC
  Administered 2017-01-05 – 2017-01-12 (×14): 12.5 mg via ORAL
  Filled 2017-01-04 (×17): qty 1

## 2017-01-04 NOTE — Progress Notes (Signed)
Physical Therapy Treatment Patient Details Name: Terry Juarez MRN: [id number] DOB: [birth year] Today's Date: 01/04/2017    History of Present Illness Pt is a 51 y/o female who presents s/p witnessed seizure at home with new L-sided weakness. No further seizure activity noted since admission. Imaging revealed R frontal lobe hemorrhage. Coded in hemodiaysis    PT Comments    Patient is making progress toward mobility goals.  +2 assist for OOB mobility. Continue to recommend CIR for further skilled PT services to maximize independence and safety with mobility.   Follow Up Recommendations  CIR;Supervision/Assistance - 24 hour     Equipment Recommendations  Wheelchair (measurements PT);Wheelchair cushion (measurements PT)    Recommendations for Other Services Rehab consult     Precautions / Restrictions Precautions Precautions: Fall Precaution Comments: Peritoneal dialysis patient; L subluxed shoulder    Mobility  Bed Mobility Overal bed mobility: Needs Assistance Bed Mobility: Rolling;Sidelying to Sit Rolling: Mod assist Sidelying to sit: Mod assist;HOB elevated       General bed mobility comments: rolled to R side; cues for sequencing and technique; assist at L UE/LE when rolling; pt able to push up through R UE and use R LE to assist in sitting  Transfers Overall transfer level: Needs assistance Equipment used:  (2 person face to face with gait belt) Transfers: Sit to/from [facility] Sit to Stand: Mod assist;+2 physical assistance Stand pivot transfers: +2 physical assistance;Max assist       General transfer comment: pt assisted with R UE/LE to power up into standing; L knee blocked during transfers and L UE supported by therapist (no sling yet); cues for sequencing and posture; assist required to pivot L foot while blocking knee  Ambulation/Gait                 Stairs            Wheelchair Mobility    Modified Rankin (Stroke  Patients Only) Modified Rankin (Stroke Patients Only) Pre-Morbid Rankin Score: No symptoms Modified Rankin: Severe disability     Balance Overall balance assessment: Needs assistance Sitting-balance support: Feet supported;Single extremity supported Sitting balance-Leahy Scale: Poor Sitting balance - Comments: worked on righting posture to midline  Postural control: Left lateral lean;Posterior lean Standing balance support: Bilateral upper extremity supported Standing balance-Leahy Scale: Zero Standing balance comment: L knee blocked or it will buckle                            Cognition Arousal/Alertness: Awake/alert Behavior During Therapy: Flat affect Overall Cognitive Status: Impaired/Different from baseline Area of Impairment: Attention;Memory;Following commands;Safety/judgement;Awareness;Problem solving                   Current Attention Level: Selective   Following Commands: Follows one step commands consistently Safety/Judgement: Decreased awareness of safety;Decreased awareness of deficits Awareness: Emergent Problem Solving: Slow processing General Comments: trunk control affected by attention      Exercises      General Comments General comments (skin integrity, edema, etc.): sensation intact L side; mother in law present; dizziness in sitting--VSS      Pertinent Vitals/Pain Pain Assessment: No/denies pain Faces Pain Scale: No hurt    Home Living                      Prior Function            PT Goals (current goals can now be found  in the care plan section) Acute Rehab PT Goals Patient Stated Goal: to be able to do more for myself PT Goal Formulation: Patient unable to participate in goal setting Time For Goal Achievement: 01/04/17 Potential to Achieve Goals: Good Progress towards PT goals: Progressing toward goals    Frequency    Min 4X/week      PT Plan Current plan remains appropriate    Co-evaluation               AM-PAC PT "6 Clicks" Daily Activity  Outcome Measure  Difficulty turning over in bed (including adjusting bedclothes, sheets and blankets)?: Total Difficulty moving from lying on back to sitting on the side of the bed? : Total Difficulty sitting down on and standing up from a chair with arms (e.g., wheelchair, bedside commode, etc,.)?: Total Help needed moving to and from a bed to chair (including a wheelchair)?: Total Help needed walking in hospital room?: Total Help needed climbing 3-5 steps with a railing? : Total 6 Click Score: 6    End of Session Equipment Utilized During Treatment: Gait belt Activity Tolerance: Patient tolerated treatment well Patient left: in chair;with call bell/phone within reach;with chair alarm set;with family/visitor present Nurse Communication: Mobility status;Need for lift equipment PT Visit Diagnosis: Hemiplegia and hemiparesis Hemiplegia - Right/Left: Left Hemiplegia - dominant/non-dominant: Non-dominant Hemiplegia - caused by: Nontraumatic intracerebral hemorrhage     Time: 0916-0938 PT Time Calculation (min) (ACUTE ONLY): 22 min  Charges:  $Therapeutic Activity: 8-22 mins                    G Codes:       Earney Navy, PTA Pager: [phone]     Darliss Cheney 01/04/2017, 11:07 AM

## 2017-01-04 NOTE — Progress Notes (Signed)
Occupational Therapy Treatment Patient Details Name: Terry Juarez MRN: [id number] DOB: 1965/12/02 Today's Date: 01/04/2017    History of present illness Pt is a 51 y/o female who presents s/p witnessed seizure at home with new L-sided weakness. No further seizure activity noted since admission. Imaging revealed R frontal lobe hemorrhage. Coded in hemodiaysis   OT comments  Making steady progress. Using mirror during ADL session for feedback on midline postural control. Pt copmleted grooming task at sink level with min A. Facilitation of trunk during functional mobility. Pt very motivated to maximize her functional level of independence. Family present during session. Excellent CIR candidate. Will continue to follow acutely and update goals next session.    Follow Up Recommendations  CIR;Supervision/Assistance - 24 hour    Equipment Recommendations  Other (comment)    Recommendations for Other Services Rehab consult    Precautions / Restrictions Precautions Precautions: Fall Precaution Comments: Peritoneal dialysis patient; L subluxed shoulder       Mobility Bed Mobility               General bed mobility comments: OOB in chair  Transfers Overall transfer level: Needs assistance   Transfers: Sit to/from Stand Sit to Stand: Mod assist         General transfer comment: facilitation trought pelvis. Vc for upright posture. Support provided underneath L axilla    Balance     Sitting balance-Leahy Scale: Poor Sitting balance - Comments: L bias   Standing balance support: During functional activity Standing balance-Leahy Scale: Poor Standing balance comment: using mirror for feedback for midline orientation. Blocking LLE. Pt able to activate LLE                           ADL either performed or assessed with clinical judgement   ADL Overall ADL's : Needs assistance/impaired Eating/Feeding: Supervision/ safety   Grooming: Sitting;Set  up;Supervision/safety Grooming Details (indicate cue type and reason): sitting in front of sink     Lower Body Bathing: Moderate assistance Lower Body Bathing Details (indicate cue type and reason): Pt using RUE for BLE. Hand over hand to facilitate weight bearing and movement LUE Upper Body Dressing : Moderate assistance Upper Body Dressing Details (indicate cue type and reason): Pt able to donn shirt over LUE and place R UE in arm hole appropriately.                 Functional mobility during ADLs: Maximal assistance (sit - stand) General ADL Comments: Pt using mirror for trunk orientation during ADL. Pt states it feels good to do things for herself     Vision   Vision Assessment?: Vision impaired- to be further tested in functional context Additional Comments: "blurry vision" at times. Pt with minimal overshooting during functional reach   Perception  will further assess   Praxis      Cognition Arousal/Alertness: Awake/alert Behavior During Therapy: Flat affect Overall Cognitive Status: Impaired/Different from baseline Area of Impairment: Attention;Memory;Following commands;Safety/judgement;Awareness;Problem solving                   Current Attention Level: Selective   Following Commands: Follows one step commands consistently Safety/Judgement: Decreased awareness of safety;Decreased awareness of deficits Awareness: Emergent Problem Solving: Slow processing General Comments: trunk control affected by attention        Exercises     Shoulder Instructions       General Comments  Flaccid LUE; moves in flexor synergy  pattern with pain stimuli; note inferior subluxation; Brunstrom level 1    Pertinent Vitals/ Pain       Pain Assessment: Faces Faces Pain Scale: No hurt  Home Living                                          Prior Functioning/Environment              Frequency  Min 3X/week        Progress Toward Goals  OT  Goals(current goals can now be found in the care plan section)  Progress towards OT goals: Progressing toward goals  Acute Rehab OT Goals Patient Stated Goal: to be able to do more for myself OT Goal Formulation: With patient Time For Goal Achievement: 01/05/17 Potential to Achieve Goals: Good ADL Goals Pt Will Perform Grooming: sitting;with min guard assist Pt Will Transfer to Toilet: with min assist;stand pivot transfer;bedside commode Pt/caregiver will Perform Home Exercise Program: Increased ROM;Left upper extremity;Independently;With written HEP provided Additional ADL Goal #1: Pt will complete bed mobility with overall min assist in preparation for ADL seated at EOB.  Additional ADL Goal #2: Pt will demonstrate midline gaze during seated grooming tasks for approximately 5 minutes.   Plan Discharge plan remains appropriate    Co-evaluation                 AM-PAC PT "6 Clicks" Daily Activity     Outcome Measure   Help from another person eating meals?: A Little Help from another person taking care of personal grooming?: A Little Help from another person toileting, which includes using toliet, bedpan, or urinal?: A Lot Help from another person bathing (including washing, rinsing, drying)?: A Lot Help from another person to put on and taking off regular upper body clothing?: A Lot Help from another person to put on and taking off regular lower body clothing?: A Lot 6 Click Score: 14    End of Session Equipment Utilized During Treatment: Gait belt  OT Visit Diagnosis: Hemiplegia and hemiparesis;Other symptoms and signs involving cognitive function Hemiplegia - Right/Left: Left Hemiplegia - dominant/non-dominant: Non-Dominant Hemiplegia - caused by: Nontraumatic intracerebral hemorrhage   Activity Tolerance Patient tolerated treatment well   Patient Left in chair;with call bell/phone within reach;with chair alarm set;with family/visitor present   Nurse Communication  Mobility status        Time: 6979-4801 OT Time Calculation (min): 29 min  Charges: OT General Charges $OT Visit: 1 Procedure OT Treatments $Self Care/Home Management : 8-22 mins $Neuromuscular Re-education: 8-22 mins  [facility], OT/L  [phone] 01/04/2017   Terry Juarez,Terry Juarez 01/04/2017, 10:37 AM

## 2017-01-04 NOTE — Progress Notes (Signed)
Pt arrived to floor via bed at approx 1850. Pt A+O, no c/o pain, able to make needs known, call bell in reach, sr up x 2.

## 2017-01-04 NOTE — Care Management Note (Signed)
Case Management Note  Patient Details  Name: Terry Juarez MRN: [id number] Date of Birth: 05/27/1966  Subjective/Objective:                    Action/Plan: Pt discharging to CIR today. No further needs per CM.   Expected Discharge Date:  01/04/17               Expected Discharge Plan:  IP Rehab Facility  In-House Referral:  Clinical Social Work  Discharge planning Services  CM Consult  Post Acute Care Choice:    Choice offered to:     DME Arranged:    DME Agency:     HH Arranged:    [place] Agency:     Status of Service:  Completed, signed off  If discussed at [employer], dates discussed:    Additional Comments:  Pollie Friar, RN 01/04/2017, 3:32 PM

## 2017-01-04 NOTE — H&P (Signed)
Physical Medicine and Rehabilitation Admission H&P    Chief Complaint  Patient presents with  . Seizures  : HPI:  Terry Juarez is a 51 y.o. right handed female with history of hypertension,history of GI bleed, ESRD with home peritoneal dialysis, right renal cancer status post nephrectomy. Per chart review and mother-in-law patient lives in [place] with a roommate. Independent prior to admission. One level home with 3 steps to entry. Question 24-hour assistance on discharge. Presented 12/19/2016 with new onset of seizure, acute left-sided weakness and right frontal headache. UDS positive for benzos. Troponin 1.21. CT scan imaging showed a 29 mm right frontal hemorrhage with surrounding edema and mass effect, also a small SAH and signs of PRES. CTA head and neck showed no AVMs but showed the hematoma was mildly increased from the earlier imaging. Maintain on nicardipine drip as well as Keppra. MRI of the brain reviewed, showing stable hemorrhage. Latest cranial CT head 12/24/2016 reviewed, stable. EEG showed diffuse cerebral dysfunction no seizure activity.Dialysis ongoing as per renal services. Dysphagia #3 thin liquid diet. Therapy evaluations completed 12/22/2016 with recommendations of physical medicine rehabilitation consult.Patient was admitted for a comprehensive rehabilitation program  Review of Systems  Constitutional: Negative for chills and fever.  HENT: Negative for hearing loss.   Eyes: Negative for blurred vision and double vision.  Respiratory: Negative for cough and shortness of breath.   Cardiovascular: Positive for leg swelling. Negative for chest pain and palpitations.  Gastrointestinal: Positive for constipation. Negative for nausea and vomiting.  Genitourinary: Negative for dysuria, flank pain and hematuria.  Musculoskeletal: Positive for joint pain.  Skin: Negative for rash.  Neurological: Positive for dizziness, speech change, focal weakness, weakness and  headaches. Negative for sensory change.  All other systems reviewed and are negative.  Past Medical History:  Diagnosis Date  . Anemia   . Bruises easily   . Dialysis patient ([place])   . ESRD on dialysis ([place])   . Hyperlipidemia   . Hypertension   . Renal disorder    rt renal mass / < functioning of left kidney - being prepared for possible dialysis  . Right renal mass    Past Surgical History:  Procedure Laterality Date  . AV FISTULA PLACEMENT Right 07/11/2014   Procedure: ARTERIOVENOUS (AV) FISTULA CREATION RIGHT ARM BRACHIO-CEPHALIC WITH ATTEMPTED RADIO-CEPHALIC (AV) FISTULA;  Surgeon: Mal Misty, MD;  Location: [facility];  Service: Vascular;  Laterality: Right;  . COLONOSCOPY WITH PROPOFOL N/A 09/04/2016   Procedure: COLONOSCOPY WITH PROPOFOL;  Surgeon: Carol Ada, MD;  Location: [facility] END[facility]COPY;  Service: Endoscopy;  Laterality: N/A;  . ECTOPIC PREGNANCY SURGERY  1987  . ESOPHAGOGASTRODUODENOSCOPY N/A 07/30/2016   Procedure: ESOPHAGOGASTRODUODENOSCOPY (EGD);  Surgeon: Carol Ada, MD;  Location: [facility] ENDOSCOPY;  Service: Endoscopy;  Laterality: N/A;  Bedside  . ESOPHAGOGASTRODUODENOSCOPY (EGD) WITH PROPOFOL N/A 09/04/2016   Procedure: ESOPHAGOGASTRODUODENOSCOPY (EGD) WITH PROPOFOL;  Surgeon: Carol Ada, MD;  Location: [facility] ENDOSCOPY;  Service: Endoscopy;  Laterality: N/A;  . INSERTION OF DIALYSIS CATHETER Right 07/11/2014   Procedure: INSERTION OF DIALYSIS CATHETER IN RIGHT INTERNAL JUGULAR ;  Surgeon: Mal Misty, MD;  Location: [facility];  Service: Vascular;  Laterality: Right;  . LAPAROSCOPIC NEPHRECTOMY Right 07/25/2014   Procedure: RIGHT LAPAROSCOPIC RADICAL NEPHRECTOMY ;  Surgeon: Ardis Hughs, MD;  Location: [facility];  Service: Urology;  Laterality: Right;  . PATCH ANGIOPLASTY Right 07/11/2014   Procedure: PATCH ANGIOPLASTY OF RIGHT RADIAL ARTERY USING CEPHALIC VEIN.;  Surgeon: Mal Misty, MD;  Location: [facility] OR;  Service: Vascular;  Laterality: Right;  . THROMBECTOMY W/ EMBOLECTOMY  Right 07/11/2014   Procedure: THROMBECTOMY OF RIGHT RADIAL ARTERY  ;  Surgeon: Mal Misty, MD;  Location: [facility] OR;  Service: Vascular;  Laterality: Right;   Family History  Problem Relation Age of Onset  . Throat cancer Mother        smoked  . Hypertension Father    Social History:  reports that she has never smoked. She has never used smokeless tobacco. She reports that she drinks alcohol. She reports that she does not use drugs. Allergies:  Allergies  Allergen Reactions  . Lisinopril Cough   Medications Prior to Admission  Medication Sig Dispense Refill  . acetaminophen (TYLENOL) 500 MG tablet Take 1,000 mg by mouth every 6 (six) hours as needed for moderate pain or headache.    . calcitRIOL (ROCALTROL) 0.5 MCG capsule Take 0.5 mcg by mouth every Monday, Wednesday, and Friday.    . calcium acetate (PHOSLO) 667 MG capsule Take 667-2,001 mg by mouth See admin instructions. Take 3 capsules (2001 mg) by mouth three times daily with meals and take 1 capsule (667 mg) twice daily with snacks    . carvedilol (COREG) 12.5 MG tablet Take 12.5 mg by mouth 2 (two) times daily.  0  . cinacalcet (SENSIPAR) 60 MG tablet Take 60 mg by mouth at bedtime.    . cloNIDine (CATAPRES) 0.1 MG tablet Take 0.1 mg by mouth 3 (three) times daily as needed (SBP >180).   0  . levothyroxine (SYNTHROID, LEVOTHROID) 88 MCG tablet Take 88 mcg by mouth daily before breakfast.   0  . multivitamin (RENA-VIT) TABS tablet Take 1 tablet by mouth daily.    [place] Calcium 500 MG TABS Take 500 mg by mouth 3 (three) times daily.  0  . sodium bicarbonate 650 MG tablet Take 650 mg by mouth 2 (two) times daily.    . [DISCONTINUED] amLODipine (NORVASC) 10 MG tablet Take 10 mg by mouth daily.     . [DISCONTINUED] losartan (COZAAR) 100 MG tablet Take 100 mg by mouth at bedtime.   2  . pantoprazole (PROTONIX) 40 MG tablet Take 1 tablet (40 mg total) by mouth 2 (two) times daily. (Patient not taking: Reported on 12/19/2016) 60  tablet 1    Home: [place] expects to be discharged to:: Private residence Living Arrangements: Non-relatives/Friends Available Help at Discharge: Family, Friend(s) Type of Home: House Home Access: Stairs to enter Technical brewer of Steps: 3 Home Layout: One level Bathroom Shower/Tub: Multimedia programmer: Associate Professor Accessibility: Yes Home Equipment: Civil engineer, contracting  Lives With: Other (Comment) (roommate)   Functional History: Prior Function Level of Independence: Independent Comments: Per mother-in-law  Functional Status:  Mobility: Bed Mobility Overal bed mobility: Needs Assistance Bed Mobility: Rolling, Sidelying to Sit Rolling: Mod assist Sidelying to sit: Mod assist, HOB elevated Supine to sit: Mod assist, +2 for physical assistance Sit to sidelying: Max assist, +2 for physical assistance General bed mobility comments: rolled to R side; cues for sequencing and technique; assist at L UE/LE when rolling; pt able to push up through R UE and use R LE to assist in sitting Transfers Overall transfer level: Needs assistance Equipment used:  (2 person face to face with gait belt) Transfers: Sit to/from Stand, Stand Pivot Transfers Sit to Stand: Mod assist, +2 physical assistance Stand pivot transfers: +2 physical assistance, Max assist Squat pivot transfers: Max assist, +2 safety/equipment  General transfer comment: pt assisted with R UE/LE to power up into standing; L knee blocked during transfers and L UE supported by therapist (no sling yet); cues for sequencing and posture; assist required to pivot L foot while blocking knee Ambulation/Gait General Gait Details: not ready    ADL: ADL Overall ADL's : Needs assistance/impaired Eating/Feeding: Supervision/ safety Eating/Feeding Details (indicate cue type and reason): with total A for balance while seated EOB Grooming: Sitting, Set up, Supervision/safety Grooming Details (indicate  cue type and reason): sitting in front of sink Upper Body Bathing: Maximal assistance, Sitting Lower Body Bathing: Moderate assistance Lower Body Bathing Details (indicate cue type and reason): Pt using RUE for BLE. Hand over hand to facilitate weight bearing and movement LUE Upper Body Dressing : Moderate assistance Upper Body Dressing Details (indicate cue type and reason): Pt able to donn shirt over LUE and place R UE in arm hole appropriately. Lower Body Dressing: Total assistance, Bed level Toilet Transfer: Maximal assistance, +2 for physical assistance, +2 for safety/equipment Toilet Transfer Details (indicate cue type and reason): simulated this session with sit to stand from [place] and Hygiene: Total assistance, Sit to/from stand Functional mobility during ADLs: Maximal assistance (sit - stand) General ADL Comments: Pt using mirror for trunk orientation during ADL. Pt states it feels good to do things for herself  Cognition: Cognition Overall Cognitive Status: Impaired/Different from baseline Arousal/Alertness: Lethargic Orientation Level: Oriented X4 Attention: Sustained Sustained Attention: Impaired Sustained Attention Impairment: Verbal basic Memory: Impaired Memory Impairment: Decreased recall of new information, Retrieval deficit Awareness: Impaired Awareness Impairment: Emergent impairment, Anticipatory impairment Safety/Judgment: Impaired Cognition Arousal/Alertness: Awake/alert Behavior During Therapy: Flat affect Overall Cognitive Status: Impaired/Different from baseline Area of Impairment: Attention, Memory, Following commands, Safety/judgement, Awareness, Problem solving Orientation Level: Disoriented to, Time Current Attention Level: Selective Following Commands: Follows one step commands consistently Safety/Judgement: Decreased awareness of safety, Decreased awareness of deficits Awareness: Emergent Problem Solving: Slow  processing General Comments: trunk control affected by attention Difficult to assess due to: Level of arousal  Physical Exam: Blood pressure (!) 143/93, pulse 79, temperature 98.6 F (37 C), temperature source Oral, resp. rate 18, height '5\' 5"'  (1.651 m), weight 50.3 kg (110 lb 14.3 oz), SpO2 94 %. Physical Exam  Constitutional: She appears well-developed.  Frail  HENT:  Head: Normocephalic and atraumatic.  Eyes:  Pupils sluggish but reactive to light  Neck: Normal range of motion. Neck supple. No thyromegaly present.  Cardiovascular: Normal rate, regular rhythm and normal heart sounds.   Respiratory: Effort normal and breath sounds normal. No respiratory distress.  GI: Soft. Bowel sounds are normal. She exhibits no distension. There is no tenderness.  Musculoskeletal: She exhibits no edema or tenderness.  Neurological: She is alert.  She did answer basic questions as of age date of birth. Fair awareness of deficits.  Follows simple commands Motor: RUE/RLE: 5/5 LUE/LLE: 0/5 Sensation intact to light touch   Skin: Skin is warm and dry.  Psychiatric: Her behavior is normal. Her affect is blunt.     Results for orders placed or performed during the hospital encounter of 12/19/16 (from the past 48 hour(s))  Triglycerides     Status: None   Collection Time: 01/03/17  2:32 AM  Result Value Ref Range   Triglycerides 109 <150 mg/dL  Basic metabolic panel     Status: Abnormal   Collection Time: 01/04/17  2:38 AM  Result Value Ref Range   Sodium 136 135 - 145 mmol/L  Potassium 3.7 3.5 - 5.1 mmol/L   Chloride 96 (L) 101 - 111 mmol/L   CO2 27 22 - 32 mmol/L   Glucose, Bld 112 (H) 65 - 99 mg/dL   BUN 55 (H) 6 - 20 mg/dL   Creatinine, Ser 12.59 (H) 0.44 - 1.00 mg/dL   Calcium 10.5 (H) 8.9 - 10.3 mg/dL   GFR calc non Af Amer 3 (L) >60 mL/min   GFR calc Af Amer 3 (L) >60 mL/min    Comment: (NOTE) The eGFR has been calculated using the CKD EPI equation. This calculation has not been  validated in all clinical situations. eGFR's persistently <60 mL/min signify possible Chronic Kidney Disease.    Anion gap 13 5 - 15   No results found.     Medical Problem List and Plan: 1.  Decreased functional mobility secondary to right frontal hemorrhage, small SAH and signs of PRES 2.  DVT Prophylaxis/Anticoagulation: SCDs. Check vascular study 3. Pain Management: Tylenol as needed 4. Mood: Provide emotional support 5. Neuropsych: This patient is not fully capable of making decisions on her own behalf. 6. Skin/Wound Care: Routine skin checks 7. Fluids/Electrolytes/Nutrition: Routine I&O with follow-up chemistries 8. Seizure prophylaxis. Keppra 500 mg every 12 hours 9. ESRD with history of right renal cancer status post nephrectomy. Continue peritoneal dialysis as per renal services 10. Hypertension.  Norvasc 5 mg twice daily, Coreg 12.5 mg twice a day, Cozaar 50 mg daily, . Monitor with increased mobility 11. Hypothyroidism. Synthroid 12.Constipation. Laxative assistance   Post Admission Physician Evaluation: 1. Preadmission assessment reviewed and changes made below. 2. Functional deficits secondary  to right frontal hemorrhage, small SAH and signs of PRES. 3. Patient is admitted to receive collaborative, interdisciplinary care between the physiatrist, rehab nursing staff, and therapy team. 4. Patient's level of medical complexity and substantial therapy needs in context of that medical necessity cannot be provided at a lesser intensity of care such as a SNF. 5. Patient has experienced substantial functional loss from his/her baseline which was documented above under the "Functional History" and "Functional Status" headings.  Judging by the patient's diagnosis, physical exam, and functional history, the patient has potential for functional progress which will result in measurable gains while on inpatient rehab.  These gains will be of substantial and practical use upon discharge   in facilitating mobility and self-care at the household level. 86. Physiatrist will provide 24 hour management of medical needs as well as oversight of the therapy plan/treatment and provide guidance as appropriate regarding the interaction of the two. 7. 24 hour rehab nursing will assist with safety, disease management and patient education  and help integrate therapy concepts, techniques,education, etc. 8. PT will assess and treat for/with: Lower extremity strength, range of motion, stamina, balance, functional mobility, safety, adaptive techniques and equipment, coping skills, pain control, stroke education.   Goals are: Min A. 9. OT will assess and treat for/with: ADL's, functional mobility, safety, upper extremity strength, adaptive techniques and equipment, ego support, and community reintegration.   Goals are: Min A. Therapy may proceed with showering this patient. 10. SLP will assess and treat for/with: cognition.  Goals are: Supervision/Mod I. 11. Case Management and Social Worker will assess and treat for psychological issues and discharge planning. 12. Team conference will be held weekly to assess progress toward goals and to determine barriers to discharge. 13. Patient will receive at least 3 hours of therapy per day at least 5 days per week. 14. ELOS: 12-17 days.  15. Prognosis:  good   Delice Lesch, MD, [facility], MD 01/04/2017

## 2017-01-04 NOTE — Discharge Summary (Signed)
Physician Discharge Summary  Terry Juarez AST:[id number] DOB: May 11, 1966 DOA: 12/19/2016  PCP: [name], MD  Admit date: 12/19/2016 Discharge date: 01/04/2017  Admitted From: Home Disposition:  To CIR  Recommendations for Outpatient Follow-up:  1. Patient will be followed by nephrology and CIR. 2. Follow-up with Dr Erlinda Hong in 5 weeks  Home Health: None Equipment/Devices: None  Discharge Condition: Fair CODE STATUS: Full code Diet recommendation: Renal    Discharge Diagnoses:  Principal Problem:   ICH (intracerebral hemorrhage) ([place])   Active Problems:   PRES (posterior reversible encephalopathy syndrome)   ESRD on peritoneal dialysis (HCC)   Subarachnoid hemorrhage   Seizure ([place])   Cardiac arrest, cause unspecified ([place])   Acute respiratory failure with hypoxemia ([place])   Acute encephalopathy   Hypertensive emergency   Acute left hemiparesis (HCC)  Brief narrative/history of present illness 51 year old female with ESRD on peritoneal dialysis, renal cell carcinoma status post right nephrectomy, hypertension, hyperlipidemia and anemia was found down on the floor actively seizing by her roommate. She was brought to the ED with symptoms of right frontal headache and acute left-sided weakness. Head CT showed large 29 mm right frontal hemorrhage with surrounding edema and mass effect along with small subarachnoid hemorrhages and signs of PRES. CT angiogram head and neck showed no AVMs but with increase in size of the hematoma. Patient to my neurology in the ED and started on nicardipine and  Keppra. MRI of the brain showed no change in the size of right frontal hematoma and also conformed PRES.  Last head CT on 6/21 showed stable right frontal hemorrhage with evolving surrounding edema and stable 3-4 mm midline shift.  Patient transferred to hospitalist service on 6/24.  Hospital course   Active Problems:   Intracranial hemorrhage (HCC) Right posterior frontal lobe  acute hemorrhage with surrounding edema and local mass effect. Has residual dense left hemiparesis.  CT and MRI findings suggestive of PRES in the setting of hypertension. Also has subarachnoid hemorrhage. No vascular explanation for right frontoparietal hematoma. Has  residual left hemiparesis. 2-D echo without cardiac source of emboli. EEG shows diffuse slowing. No antithrombotic or anticoagulation due to hemorrhage. Appreciate neurology's recommendation. Aggressive stroke risk factor management.  Follow-up with Dr Erlinda Hong in 6 weeks.    PRES (posterior reversible encephalopathy syndrome) with hypertensive emergency Was on 5 different blood pressure medications. Now much improved. Blood pressure medication titrated down to cortical bone 5 mg twice a day, amlodipine 5 mg twice a day and losartan 50 mg daily and is currently stable.   Acute seizures Likely triggered by intracranial hemorrhage. Started on empiric Keppra. EEG shows diffuse slowing.   Cardiac arrest on 6/22 Short cardiac arrest of 30 seconds while at hemodialysis on 6/22. Plan on no further attempt to hemodialysis due to labile blood pressure.  Acute respiratory failure with hypoxia Multifactorial secondary to hypertensive emergency, seizures and ICH Currently stable on room air.  ESRD on PD Not tolerating hemodialysis. Renal consult appreciated. Resumed Sensipar, calcitriol and PhosLo. Refused to be on HD in future.  History of renal cell carcinoma status post right nephrectomy  Polycythemia Possibly due to ESA. Prior history of GI bleed and anemia. Holding ESA.  Constipation  daily MiraLAX, senna/Colace  Family Communication  :  mother at bedside  Seen by physical therapy and recommend CIR.  Disposition Plan  :  CIR  Consults  :   Neurology Nephrology CIR   Procedures  :  Hemodialysis CT head CT angiogram head  and neck MRI brain    Discharge Instructions  Discharge Instructions     Ambulatory referral to Neurology    Complete by:  As directed    Pt will follow up with stroke MD Erlinda Hong preferred, if not available, then consider Leonie Man, Penumalli or [facility]) at [facility] in about 2 months. Thanks.     Allergies as of 01/04/2017      Reactions   Lisinopril Cough      Medication List    STOP taking these medications   cloNIDine 0.1 MG tablet Commonly known as:  CATAPRES     TAKE these medications   acetaminophen 500 MG tablet Commonly known as:  TYLENOL Take 1,000 mg by mouth every 6 (six) hours as needed for moderate pain or headache.   amLODipine 5 MG tablet Commonly known as:  NORVASC Take 1 tablet (5 mg total) by mouth 2 (two) times daily. What changed:  medication strength  how much to take  when to take this   calcitRIOL 0.5 MCG capsule Commonly known as:  ROCALTROL Take 0.5 mcg by mouth every Monday, Wednesday, and Friday.   calcium acetate 667 MG capsule Commonly known as:  PHOSLO Take 667-2,001 mg by mouth See admin instructions. Take 3 capsules (2001 mg) by mouth three times daily with meals and take 1 capsule (667 mg) twice daily with snacks   carvedilol 12.5 MG tablet Commonly known as:  COREG Take 12.5 mg by mouth 2 (two) times daily.   cinacalcet 60 MG tablet Commonly known as:  SENSIPAR Take 60 mg by mouth at bedtime.   feeding supplement (NEPRO CARB STEADY) Liqd Take 237 mLs by mouth 2 (two) times daily between meals.   levETIRAcetam 500 MG tablet Commonly known as:  KEPPRA Take 1 tablet (500 mg total) by mouth 2 (two) times daily.   levothyroxine 88 MCG tablet Commonly known as:  SYNTHROID, LEVOTHROID Take 88 mcg by mouth daily before breakfast.   losartan 100 MG tablet Commonly known as:  COZAAR Take 0.5 tablets (50 mg total) by mouth at bedtime. What changed:  how much to take   multivitamin Tabs tablet Take 1 tablet by mouth daily.   Oyster Shell Calcium 500 MG Tabs Take 500 mg by mouth 3 (three) times daily.    pantoprazole 40 MG tablet Commonly known as:  PROTONIX Take 1 tablet (40 mg total) by mouth 2 (two) times daily.   polyethylene glycol packet Commonly known as:  MIRALAX / GLYCOLAX Take 17 g by mouth daily. Start taking on:  01/05/2017   senna-docusate 8.6-50 MG tablet Commonly known as:  Senokot-S Take 1 tablet by mouth 2 (two) times daily.   sodium bicarbonate 650 MG tablet Take 650 mg by mouth 2 (two) times daily.      Follow-up Information    Rosalin Hawking, MD. Schedule an appointment as soon as possible for a visit in 6 week(s).   Specialty:  Neurology Contact information: [address] [place] [address] [phone]          Allergies  Allergen Reactions  . Lisinopril Cough        Procedures/Studies: Ct Angio Head W Or Wo Contrast  Result Date: 12/19/2016 CLINICAL DATA:  Intraparenchymal and subarachnoid hemorrhage. EXAM: CT ANGIOGRAPHY HEAD AND NECK TECHNIQUE: Multidetector CT imaging of the head and neck was performed using the standard protocol during bolus administration of intravenous contrast. Multiplanar CT image reconstructions and MIPs were obtained to evaluate the vascular anatomy. Carotid stenosis  measurements (when applicable) are obtained utilizing NASCET criteria, using the distal internal carotid diameter as the denominator. CONTRAST:  50 cc Isovue 370 intravenous COMPARISON:  Noncontrast head CT from earlier today FINDINGS: CTA NECK FINDINGS Aortic arch: Atheromatous wall thickening that is mild. No acute finding or aneurysm. Right carotid system: Mild atheromatous plaque at the common carotid bifurcation without stenosis or ulceration. Beaded ICA consistent with fibromuscular dysplasia. No superimposed acute dissection. Left carotid system: Scattered atheromatous plaque on the distal common carotid and proximal ICA, moderate for age. No flow limiting stenosis or ulceration. Beading of the left ICA consistent with fibromuscular  dysplasia. No superimposed dissection. Vertebral arteries: Proximal subclavian atherosclerosis without flow limiting stenosis. Both vertebral arteries are smooth and widely patent to the dura. Left vertebral dominance. Skeleton: No acute or aggressive finding. Other neck: Negative for mass or inflammation. Upper chest: Negative Review of the MIP images confirms the above findings CTA HEAD FINDINGS Anterior circulation: Moderate calcified plaque on the carotid siphons without flow limiting stenosis. Standard branching. There is no evidence of aneurysm or vascular malformation. No major branch occlusion or proximal flow limiting stenosis. Posterior circulation: Left dominant vertebral artery. The vertebral and basilar arteries are smooth and widely patent. Aplastic right and mildly hypoplastic left P1 segments with large posterior communicating arteries. No branch occlusion, stenosis, aneurysm, or evidence of vascular malformation. Venous sinuses: Patent on the delayed phase. Anatomic variants: Fetal type PCA circulation, especially on the right. Delayed phase: No masslike enhancement. Pending brain MRI. The right frontal parietal hematoma is larger than before, measuring 27 x 34 x 35 mm as compared to 20 x 31 x 28 mm. Local subarachnoid hemorrhage and mild cerebral edema without shift or herniation. Review of the MIP images confirms the above findings IMPRESSION: 1. No vascular explanation for the right frontal parietal hematoma. Dural venous sinuses are patent and there is no evidence of vascular malformation. 2. The 16 cc hematoma is mildly increased from earlier CT. Negative for shift or herniation. 3. Fibromuscular dysplasia of the bilateral cervical ICA. 4. Moderate atherosclerosis for age. Negative for flow limiting stenosis. Electronically Signed   By: Monte Fantasia M.D.   On: 12/19/2016 19:37   Ct Head Wo Contrast  Result Date: 12/24/2016 CLINICAL DATA:  Intraparenchymal hemorrhage. EXAM: CT HEAD  WITHOUT CONTRAST TECHNIQUE: Contiguous axial images were obtained from the base of the skull through the vertex without intravenous contrast. COMPARISON:  Multiple prior head CTs, most recently 12/22/2016 FINDINGS: Brain: The right frontal hemorrhage is stable in size. Evolving surrounding edema is noted. 3-4 mm of midline shift is not significantly changed. No new hemorrhage is present. The subarachnoid hemorrhage is not significantly changed. Vascular: Atherosclerotic calcifications are again noted. Skull: Calvarium is intact. Sinuses/Orbits: The paranasal sinuses and mastoid air cells are clear. IMPRESSION: 1. Stable right frontal hemorrhage with evolving surrounding edema. 2. Stable 3-4 mm of midline shift. Electronically Signed   By: San Morelle M.D.   On: 12/24/2016 12:22   Ct Head Wo Contrast  Result Date: 12/22/2016 CLINICAL DATA:  Patient admitted for [place], stroke. MRI demonstrated PRES. EXAM: CT HEAD WITHOUT CONTRAST TECHNIQUE: Contiguous axial images were obtained from the base of the skull through the vertex without intravenous contrast. COMPARISON:  Multiple priors, most recent earlier today at 3:55 a.m. FINDINGS: Brain: Redemonstrated is an intraparenchymal hemorrhage, not significantly changed in size or shape, roughly 4 x 3 x 5 cm. Mild surrounding vasogenic edema is not increased. Slight effacement RIGHT lateral ventricle. Unchanged midline  shift RIGHT to LEFT, roughly 3 mm. No hydrocephalus or ventricular trapping. Effacement of the basilar cisterns redemonstrated. Vascular: No hyperdense vessel or unexpected calcification. Skull: Normal. Negative for fracture or focal lesion. Sinuses/Orbits: No acute finding. Other: None. IMPRESSION: Stable intraparenchymal hemorrhage. Subtle signs of midline shift, and slight increased pressure. But no significant worsening from priors. Electronically Signed   By: Staci Righter M.D.   On: 12/22/2016 09:47   Ct Head Wo Contrast  Result Date:  12/22/2016 CLINICAL DATA:  Follow-up examination for acute intracranial hemorrhage. EXAM: CT HEAD WITHOUT CONTRAST TECHNIQUE: Contiguous axial images were obtained from the base of the skull through the vertex without intravenous contrast. COMPARISON:  Prior CT from 12/21/2016. FINDINGS: Brain: Intraparenchymal hemorrhage centered at the high right frontal parietal region again seen. Hemorrhage overall not significantly changed measuring 4.1 x 3.3 x 5.4 cm seen greatest dimensions (previously 4.1 x 3.4 x 5.5 cm). Surrounding low-density vasogenic edema relatively with sulcal effacement relatively similar. Mass effect on the right lateral ventricle which is partially effaced relatively unchanged. Right-to-left midline shift measures approximately 3 mm, similar. No hydrocephalus or ventricular trapping. No intraventricular extension of hemorrhage. Adjacent small volume subarachnoid hemorrhage noted, similar. Adjacent extra-axial hemorrhage unchanged. No new acute intracranial hemorrhage. No evidence for acute large vessel territory infarct. Vascular: No hyperdense vessel. Scattered vascular calcifications noted within the carotid siphons. Skull: Scalp soft tissues and calvarium unchanged, and remain within normal limits. Sinuses/Orbits: Globes and orbital soft tissues within normal limits. Paranasal sinuses and mastoid air cells remain clear. IMPRESSION: 1. No significant interval change in posterior right frontal intraparenchymal hematoma with extra-axial extension. Similar localized edema and mass effect with 3 mm right-to-left shift. 2. No other new acute intracranial process. Electronically Signed   By: Jeannine Boga M.D.   On: 12/22/2016 04:49   Ct Head Wo Contrast  Result Date: 12/21/2016 CLINICAL DATA:  Intracranial hemorrhage. EXAM: CT HEAD WITHOUT CONTRAST TECHNIQUE: Contiguous axial images were obtained from the base of the skull through the vertex without intravenous contrast. COMPARISON:  CT  head without contrast 12/20/2016 and 12/19/2016. FINDINGS: Brain: The focal hyperdense hemorrhage within the high right frontal lobe extends to the cortex. The area of hemorrhage continues to slowly increase in size. Maximal dimensions on coronal and sagittal reformats are now 4.1 x 3.4 by 5.5 cm. This compares with previous measurements of 3.8 x 8 3.3 x 5.2 cm and is significantly larger than the regional measurements. Adjacent extra-axial hemorrhage is similar the prior study. Mild edema surrounds the hemorrhage. Local mass effect is present with effacement of the sulci. Minimal midline shift is present. There is increasing effacement of the right lateral ventricle. Vascular: Atherosclerotic calcifications are present within the cavernous internal carotid arteries. There is no hyperdense vessel. Skull: The calvarium is intact. No focal lytic or blastic lesions are present. Sinuses/Orbits: The paranasal sinuses and mastoid air cells are clear. The globes and orbits are unremarkable. IMPRESSION: 1. Continued increase in size of right frontal parenchymal hemorrhage with extra-axial extension. 2. Slight increase in associated mass effect. These results were called by telephone at the time of interpretation on 12/21/2016 at 9:21 am to Dr. Rosalin Hawking , who verbally acknowledged these results. Electronically Signed   By: San Morelle M.D.   On: 12/21/2016 09:29   Ct Head Wo Contrast  Result Date: 12/20/2016 CLINICAL DATA:  Intracranial hemorrhage follow up EXAM: CT HEAD WITHOUT CONTRAST TECHNIQUE: Contiguous axial images were obtained from the base of the skull through the vertex  without intravenous contrast. COMPARISON:  Brain MRI 12/19/2016 Head CT 12/19/2016 Head CTA 12/19/2016 FINDINGS: Brain: Intraparenchymal hematoma centered in the right frontal lobe now measures 3.8 x 3.4 x 3.8 cm, previously 3.7 x 3.8 by 3.0 cm. Surrounding edema is unchanged. There is subarachnoid blood over the right convexity, also  unchanged. There is no midline shift. No hydrocephalus. Basal cisterns remain patent. Vascular: No hyperdense vessel or unexpected calcification. Skull: Normal visualized skull base, calvarium and extracranial soft tissues. Sinuses/Orbits: No sinus fluid levels or advanced mucosal thickening. No mastoid effusion. Normal orbits. IMPRESSION: 1. Slightly increased size of right frontal intraparenchymal hematoma with unchanged right convexity subarachnoid blood, compared to head CTA performed 12/19/2016. Compared to the initial head CT from this presentation, obtained at 5:02 p.m. on 12/19/2016, the hematoma has increased in size. 2. No new hemorrhage, hydrocephalus or mass effect. Electronically Signed   By: Ulyses Jarred M.D.   On: 12/20/2016 06:29   Ct Head Wo Contrast  Result Date: 12/19/2016 CLINICAL DATA:  51 y/o  F; seizure. EXAM: CT HEAD WITHOUT CONTRAST CT CERVICAL SPINE WITHOUT CONTRAST TECHNIQUE: Multidetector CT imaging of the head and cervical spine was performed following the standard protocol without intravenous contrast. Multiplanar CT image reconstructions of the cervical spine were also generated. COMPARISON:  02/11/2015 CT of the head. FINDINGS: CT HEAD FINDINGS Brain: Right posterior frontal lobe acute hemorrhage measuring 22 x 29 x 21 mm (AP x ML x CC series 3 image 26 and series 5, image 34). Small volume of subarachnoid hemorrhage with overlying the right frontal convexity and along the falx. There is a small surrounding region of hypoattenuation in the brain compatible with edema with local mass effect effacing sulci. No significant midline shift. No evidence for herniation. No large territory acute stroke identified. There are foci of hypoattenuation within subcortical white matter in the bilateral parietal lobes and occipital lobes. The distribution is suggestive of PRES. Vascular: Extensive calcific atherosclerosis of the carotid siphons. Skull: Normal. Negative for fracture or focal  lesion. Sinuses/Orbits: No acute finding. Other: None. CT CERVICAL SPINE FINDINGS Alignment: Normal. Skull base and vertebrae: No acute fracture. No primary bone lesion or focal pathologic process. Soft tissues and spinal canal: No prevertebral fluid or swelling. No visible canal hematoma. Disc levels: Straightening of cervical lordosis and discogenic degenerative changes with mild disc space narrowing and marginal osteophytes from the C3 through C5 levels. Upper chest: Negative. Other: Negative appear IMPRESSION: 1. Right posterior frontal lobe acute hemorrhage measuring up to 29 mm with small area of surrounding edema and local mass effect. No significant midline shift or herniation. Follow-up to ensure resolution is recommended to exclude an underlying mass. 2. Small volume of subarachnoid hemorrhage overlying the right frontal convexity. 3. Hypoattenuation within subcortical white matter and bilateral parietal and occipital lobes. Findings are suggestive of PRES in the setting of hypertension. This can be further characterized with MRI of the brain. 4. No acute fracture or dislocation of the cervical spine. These results were called by telephone at the time of interpretation on 12/19/2016 at 5:12 pm to Dr. Lajean Saver , who verbally acknowledged these results. Electronically Signed   By: Kristine Garbe M.D.   On: 12/19/2016 17:23   Ct Angio Neck W And/or Wo Contrast  Result Date: 12/19/2016 CLINICAL DATA:  Intraparenchymal and subarachnoid hemorrhage. EXAM: CT ANGIOGRAPHY HEAD AND NECK TECHNIQUE: Multidetector CT imaging of the head and neck was performed using the standard protocol during bolus administration of intravenous contrast. Multiplanar CT  image reconstructions and MIPs were obtained to evaluate the vascular anatomy. Carotid stenosis measurements (when applicable) are obtained utilizing NASCET criteria, using the distal internal carotid diameter as the denominator. CONTRAST:  50 cc Isovue  370 intravenous COMPARISON:  Noncontrast head CT from earlier today FINDINGS: CTA NECK FINDINGS Aortic arch: Atheromatous wall thickening that is mild. No acute finding or aneurysm. Right carotid system: Mild atheromatous plaque at the common carotid bifurcation without stenosis or ulceration. Beaded ICA consistent with fibromuscular dysplasia. No superimposed acute dissection. Left carotid system: Scattered atheromatous plaque on the distal common carotid and proximal ICA, moderate for age. No flow limiting stenosis or ulceration. Beading of the left ICA consistent with fibromuscular dysplasia. No superimposed dissection. Vertebral arteries: Proximal subclavian atherosclerosis without flow limiting stenosis. Both vertebral arteries are smooth and widely patent to the dura. Left vertebral dominance. Skeleton: No acute or aggressive finding. Other neck: Negative for mass or inflammation. Upper chest: Negative Review of the MIP images confirms the above findings CTA HEAD FINDINGS Anterior circulation: Moderate calcified plaque on the carotid siphons without flow limiting stenosis. Standard branching. There is no evidence of aneurysm or vascular malformation. No major branch occlusion or proximal flow limiting stenosis. Posterior circulation: Left dominant vertebral artery. The vertebral and basilar arteries are smooth and widely patent. Aplastic right and mildly hypoplastic left P1 segments with large posterior communicating arteries. No branch occlusion, stenosis, aneurysm, or evidence of vascular malformation. Venous sinuses: Patent on the delayed phase. Anatomic variants: Fetal type PCA circulation, especially on the right. Delayed phase: No masslike enhancement. Pending brain MRI. The right frontal parietal hematoma is larger than before, measuring 27 x 34 x 35 mm as compared to 20 x 31 x 28 mm. Local subarachnoid hemorrhage and mild cerebral edema without shift or herniation. Review of the MIP images confirms the  above findings IMPRESSION: 1. No vascular explanation for the right frontal parietal hematoma. Dural venous sinuses are patent and there is no evidence of vascular malformation. 2. The 16 cc hematoma is mildly increased from earlier CT. Negative for shift or herniation. 3. Fibromuscular dysplasia of the bilateral cervical ICA. 4. Moderate atherosclerosis for age. Negative for flow limiting stenosis. Electronically Signed   By: Monte Fantasia M.D.   On: 12/19/2016 19:37   Ct Cervical Spine Wo Contrast  Result Date: 12/19/2016 CLINICAL DATA:  51 y/o  F; seizure. EXAM: CT HEAD WITHOUT CONTRAST CT CERVICAL SPINE WITHOUT CONTRAST TECHNIQUE: Multidetector CT imaging of the head and cervical spine was performed following the standard protocol without intravenous contrast. Multiplanar CT image reconstructions of the cervical spine were also generated. COMPARISON:  02/11/2015 CT of the head. FINDINGS: CT HEAD FINDINGS Brain: Right posterior frontal lobe acute hemorrhage measuring 22 x 29 x 21 mm (AP x ML x CC series 3 image 26 and series 5, image 34). Small volume of subarachnoid hemorrhage with overlying the right frontal convexity and along the falx. There is a small surrounding region of hypoattenuation in the brain compatible with edema with local mass effect effacing sulci. No significant midline shift. No evidence for herniation. No large territory acute stroke identified. There are foci of hypoattenuation within subcortical white matter in the bilateral parietal lobes and occipital lobes. The distribution is suggestive of PRES. Vascular: Extensive calcific atherosclerosis of the carotid siphons. Skull: Normal. Negative for fracture or focal lesion. Sinuses/Orbits: No acute finding. Other: None. CT CERVICAL SPINE FINDINGS Alignment: Normal. Skull base and vertebrae: No acute fracture. No primary bone lesion or focal  pathologic process. Soft tissues and spinal canal: No prevertebral fluid or swelling. No visible  canal hematoma. Disc levels: Straightening of cervical lordosis and discogenic degenerative changes with mild disc space narrowing and marginal osteophytes from the C3 through C5 levels. Upper chest: Negative. Other: Negative appear IMPRESSION: 1. Right posterior frontal lobe acute hemorrhage measuring up to 29 mm with small area of surrounding edema and local mass effect. No significant midline shift or herniation. Follow-up to ensure resolution is recommended to exclude an underlying mass. 2. Small volume of subarachnoid hemorrhage overlying the right frontal convexity. 3. Hypoattenuation within subcortical white matter and bilateral parietal and occipital lobes. Findings are suggestive of PRES in the setting of hypertension. This can be further characterized with MRI of the brain. 4. No acute fracture or dislocation of the cervical spine. These results were called by telephone at the time of interpretation on 12/19/2016 at 5:12 pm to Dr. Lajean Saver , who verbally acknowledged these results. Electronically Signed   By: Kristine Garbe M.D.   On: 12/19/2016 17:23   Mr Brain Wo Contrast  Result Date: 12/19/2016 CLINICAL DATA:  Seizure and altered mental status. EXAM: MRI HEAD WITHOUT CONTRAST TECHNIQUE: Multiplanar, multiecho pulse sequences of the brain and surrounding structures were obtained without intravenous contrast. COMPARISON:  CTA head neck from earlier the same day FINDINGS: Brain: Acute parenchymal hematoma in the posterior right frontal lobe is size stable from CT 60 minutes prior. There is subarachnoid FLAIR signal hyperintensity along the right more than left convexity. On the right there is associated susceptibility on gradient imaging correlating with subarachnoid hemorrhage by CT. There is bilateral cortical and subcortical T2 hyperintensity within the predominantly high and posterior cerebral convexities, also seen along the left putamen. No hydrocephalus. No primary infarct  findings. Vascular: Major flow voids are preserved. Skull and upper cervical spine: Negative for marrow lesion Sinuses/Orbits: Negative Other: These results were called by telephone at the time of interpretation on 12/19/2016 at 8:30 pm to Dr. Lajean Saver , who verbally acknowledged these results. IMPRESSION: 1. Findings of posterior reversible encephalopathy syndrome. 2. Size stable posterior right frontal hematoma compared to CTA 1 hour prior. Electronically Signed   By: Monte Fantasia M.D.   On: 12/19/2016 20:35   Dg Chest Port 1 View  Result Date: 12/25/2016 CLINICAL DATA:  Intubation. EXAM: PORTABLE CHEST 1 VIEW COMPARISON:  07/29/2016 . FINDINGS: Endotracheal tube noted with its tip 4.1 cm above the carina. Heart size normal. No focal infiltrate. Prominent nipple shadows again noted. No pleural effusion or pneumothorax. IMPRESSION: 1.  Endotracheal tube tip noted 4.1 cm above the carina. 2. No acute cardiopulmonary disease. Electronically Signed   By: Marcello Moores  Register   On: 12/25/2016 07:14   Dg Swallowing Func-speech Pathology  Result Date: 12/20/2016 Objective Swallowing Evaluation: Type of Study: MBS-Modified Barium Swallow Study Patient Details Name: RIKO LUMSDEN MRN: [id number] Date of Birth: [birth year] Today's Date: 12/20/2016 Time: SLP Start Time (ACUTE ONLY): 0914-SLP Stop Time (ACUTE ONLY): 0932 SLP Time Calculation (min) (ACUTE ONLY): 13 min Past Medical History: Past Medical History: Diagnosis Date . Anemia  . Bruises easily  . Dialysis patient ([place])  . ESRD on dialysis ([place])  . Hyperlipidemia  . Hypertension  . Renal disorder   rt renal mass / < functioning of left kidney - being prepared for possible dialysis . Right renal mass  Past Surgical History: Past Surgical History: Procedure Laterality Date . AV FISTULA PLACEMENT Right 07/11/2014  Procedure: ARTERIOVENOUS (AV) FISTULA CREATION  RIGHT ARM BRACHIO-CEPHALIC WITH ATTEMPTED RADIO-CEPHALIC (AV) FISTULA;  Surgeon: Mal Misty, MD;   Location: [place];  Service: Vascular;  Laterality: Right; . COLONOSCOPY WITH PROPOFOL N/A 09/04/2016  Procedure: COLONOSCOPY WITH PROPOFOL;  Surgeon: Carol Ada, MD;  Location: [facility];  Service: Endoscopy;  Laterality: N/A; . ECTOPIC PREGNANCY SURGERY  1987 . ESOPHAGOGASTRODUODENOSCOPY N/A 07/30/2016  Procedure: ESOPHAGOGASTRODUODENOSCOPY (EGD);  Surgeon: Carol Ada, MD;  [facility];  Service: Endoscopy;  Laterality: N/A;  Bedside . ESOPHAGOGASTRODUODENOSCOPY (EGD) WITH PROPOFOL N/A 09/04/2016  Procedure: ESOPHAGOGASTRODUODENOSCOPY (EGD) WITH PROPOFOL;  Surgeon: Carol Ada, MD;  Location: [facility]OS[facility];  Service: Endoscopy;  Laterality: N/A; . INSERTION OF DIALYSIS CATHETER Right 07/11/2014  Procedure: INSERTION OF DIALYSIS CATHETER IN RIGHT INTERNAL JUGULAR ;  Surgeon: Mal Misty, MD;  Location: [place];  Service: Vascular;  Laterality: Right; . LAPAROSCOPIC NEPHRECTOMY Right 07/25/2014  Procedure: RIGHT LAPAROSCOPIC RADICAL NEPHRECTOMY ;  Surgeon: Ardis Hughs, MD;  Location: [facility];  Service: Urology;  Laterality: Right; . PATCH ANGIOPLASTY Right 07/11/2014  Procedure: PATCH ANGIOPLASTY OF RIGHT RADIAL ARTERY USING CEPHALIC VEIN.;  Surgeon: Mal Misty, MD;  Location: [place];  Service: Vascular;  Laterality: Right; . THROMBECTOMY W/ EMBOLECTOMY Right 07/11/2014  Procedure: THROMBECTOMY OF RIGHT RADIAL ARTERY  ;  Surgeon: Mal Misty, MD;  Location: [facility];  Service: Vascular;  Laterality: Right; HPI: 51 yo F with history of ESRD on Peritoneal Dialysis, HTN, Right renal mass, and Anemia, who was found down on the floor seizing. She was found to have a large right frontal hemorrhage with surrounding edema and mass effect, also with small SAH and signs of PRES. Pt describes subtle dysphagia and vocal quality changes following thyroid surgery in April. Subjective: pt alert, eager for POs, particularly before she starts dialysis Assessment / Plan / Recommendation CHL IP CLINICAL IMPRESSIONS  12/20/2016 Clinical Impression Pt's oropharyngeal swallow appears to be [facility], with no aspiration or penetration observed. Of note, she did not appear to have as many audible swallows or throat clearings during MBS as was observed at bedside, but when she did have throat clearing it was no associated with any airway compromise. She fairly consistently completed second swallows, particularly with thin liquids, but there were only trace residuals (mostly a small coating of the tongue/base of tongue) remaining throughout her oropharynx. Recommend that pt initiate regular textures and thin liquids. SLP f/u not needed for swallowing, but recommend MD order SLP cognitive-linguistic evaluation. SLP Visit Diagnosis Dysphagia, unspecified (R13.10) Attention and concentration deficit following -- Frontal lobe and executive function deficit following -- Impact on safety and function Mild aspiration risk   CHL IP TREATMENT RECOMMENDATION 12/20/2016 Treatment Recommendations No treatment recommended at this time   Prognosis 12/20/2016 Prognosis for Safe Diet Advancement Good Barriers to Reach Goals -- Barriers/Prognosis Comment -- [facility] IP DIET RECOMMENDATION 12/20/2016 SLP Diet Recommendations Regular solids;Thin liquid Liquid Administration via Cup;Straw Medication Administration Whole meds with liquid Compensations Slow rate;Small sips/bites Postural Changes Remain semi-upright after after feeds/meals (Comment);Seated upright at 90 degrees   CHL IP OTHER RECOMMENDATIONS 12/20/2016 Recommended Consults -- Oral Care Recommendations Oral care BID Other Recommendations --   CH[facility] IP FOLLOW UP RECOMMENDATIONS 12/20/2016 Follow up Recommendations Other (comment)   No flowsheet data found.     CHL IP ORAL PHASE 12/20/2016 Oral Phase WFL Oral - Pudding Teaspoon -- Oral - Pudding Cup -- Oral - Honey Teaspoon -- Oral - Honey Cup -- Oral - Nectar Teaspoon -- Oral - Nectar Cup --  Oral - Nectar Straw -- Oral - Thin Teaspoon -- Oral - Thin Cup -- Oral  - Thin Straw -- Oral - Puree -- Oral - Mech Soft -- Oral - Regular -- Oral - Multi-Consistency -- Oral - Pill -- Oral Phase - Comment --  CHL IP PHARYNGEAL PHASE 12/20/2016 Pharyngeal Phase WFL Pharyngeal- Pudding Teaspoon -- Pharyngeal -- Pharyngeal- Pudding Cup -- Pharyngeal -- Pharyngeal- Honey Teaspoon -- Pharyngeal -- Pharyngeal- Honey Cup -- Pharyngeal -- Pharyngeal- Nectar Teaspoon -- Pharyngeal -- Pharyngeal- Nectar Cup -- Pharyngeal -- Pharyngeal- Nectar Straw -- Pharyngeal -- Pharyngeal- Thin Teaspoon -- Pharyngeal -- Pharyngeal- Thin Cup -- Pharyngeal -- Pharyngeal- Thin Straw -- Pharyngeal -- Pharyngeal- Puree -- Pharyngeal -- Pharyngeal- Mechanical Soft -- Pharyngeal -- Pharyngeal- Regular -- Pharyngeal -- Pharyngeal- Multi-consistency -- Pharyngeal -- Pharyngeal- Pill -- Pharyngeal -- Pharyngeal Comment --  CHL IP CERVICAL ESOPHAGEAL PHASE 12/20/2016 Cervical Esophageal Phase WFL Pudding Teaspoon -- Pudding Cup -- Honey Teaspoon -- Honey Cup -- Nectar Teaspoon -- Nectar Cup -- Nectar Straw -- Thin Teaspoon -- Thin Cup -- Thin Straw -- Puree -- Mechanical Soft -- Regular -- Multi-consistency -- Pill -- Cervical Esophageal Comment -- No flowsheet data found. Germain Osgood 12/20/2016, 9:44 AM  Germain Osgood, M.A. CCC-SLP [phone]                 Subjective: Continues to be better oriented. Denies any pain.  Discharge Exam: Vitals:   01/04/17 0600 01/04/17 0815  BP: 129/86 (!) 143/93  Pulse: 77 79  Resp: 16 18  Temp: 97.5 F (36.4 C) 98.6 F (37 C)   Vitals:   01/04/17 0125 01/04/17 0500 01/04/17 0600 01/04/17 0815  BP: 132/78  129/86 (!) 143/93  Pulse: 72  77 79  Resp: 18  16 18   Temp: 98.2 F (36.8 C)  97.5 F (36.4 C) 98.6 F (37 C)  TempSrc: Axillary  Oral Oral  SpO2: 95%  94%   Weight:  54.4 kg (119 lb 14.9 oz)  50.3 kg (110 lb 14.3 oz)  Height:        Gen.: Not in distress HEENT: Moist mucosa, supple neck Chest: Clear to auscultation Bilaterally CVS:  Normal S1 and S2, nondistended, nontender, PD catheter+ Musculoskeletal: Warm, no edema CNS: Alert and oriented, left hemiparesis, has normal sensation      The results of significant diagnostics from this hospitalization (including imaging, microbiology, ancillary and laboratory) are listed below for reference.     Microbiology: No results found for this or any previous visit (from the past 240 hour(s)).   Labs: BNP (last 3 results) No results for input(s): BNP in the last 8760 hours. Basic Metabolic Panel:  Recent Labs Lab 12/29/16 0532 12/30/16 0525 12/31/16 0256 01/02/17 0303 01/04/17 0238  NA 137 134* 136 139 136  K 3.8 4.5 3.7 3.8 3.7  CL 99* 98* 98* 99* 96*  CO2 24 24 25 27 27   GLUCOSE 124* 114* 119* 130* 112*  BUN 52* 53* 49* 47* 55*  CREATININE 10.91* 10.92* 10.53* 11.39* 12.59*  CALCIUM 9.5 9.7 10.0 10.6* 10.5*  MG 1.9 1.9 1.9  --   --    Liver Function Tests: No results for input(s): AST, ALT, ALKPHOS, BILITOT, PROT, ALBUMIN in the last 168 hours. No results for input(s): LIPASE, AMYLASE in the last 168 hours. No results for input(s): AMMONIA in the last 168 hours. CBC:  Recent Labs Lab 01/01/17 0623  WBC 10.3  HGB 16.9*  HCT 54.2*  MCV 103.2*  PLT 352   Cardiac Enzymes: No results for input(s): CKTOTAL, CKMB, CKMBINDEX, TROPONINI in the last 168 hours. BNP: Invalid input(s): POCBNP CBG: No results for input(s): GLUCAP in the last 168 hours. D-Dimer No results for input(s): DDIMER in the last 72 hours. Hgb A1c No results for input(s): HGBA1C in the last 72 hours. Lipid Profile  Recent Labs  01/03/17 0232  TRIG 109   Thyroid function studies No results for input(s): TSH, T4TOTAL, T3FREE, THYROIDAB in the last 72 hours.  Invalid input(s): FREET3 Anemia work up No results for input(s): VITAMINB12, FOLATE, FERRITIN, TIBC, IRON, RETICCTPCT in the last 72 hours. Urinalysis    Component Value Date/Time   COLORURINE STRAW (A)  07/31/2016 [place] 07/31/2016 0637   LABSPEC 1.009 07/31/2016 0637   PHURINE 6.0 07/31/2016 0637   GLUCOSEU NEGATIVE 07/31/2016 0637   HGBUR MODERATE (A) 07/31/2016 0637   BILIRUBINUR NEGATIVE 07/31/2016 0637   KETONESUR NEGATIVE 07/31/2016 0637   PROTEINUR 30 (A) 07/31/2016 0637   UROBILINOGEN 0.2 05/15/2014 1717   NITRITE NEGATIVE 07/31/2016 0637   LEUKOCYTESUR NEGATIVE 07/31/2016 0637   Sepsis Labs Invalid input(s): PROCALCITONIN,  WBC,  LACTICIDVEN Microbiology No results found for this or any previous visit (from the past 240 hour(s)).   Time coordinating discharge: Over 30 minutes  SIGNED:   Louellen Molder, MD  [facility] 01/04/2017, 1:28 PM Pager   If 7PM-7AM, please contact night-coverage [email or web address] Password [email or web address]

## 2017-01-04 NOTE — Progress Notes (Signed)
Stopped by to visit w/ pt, but she was asleep w/ no famly present. Rm looked cheery w/ balloons and greetings. Chaplain available for f/u.   01/04/17 1100  Clinical Encounter Type  Visited With Patient not available  Visit Type Follow-up;Psychological support;Spiritual support;Social support  Referral From [place] Mariella Blackwelder, Chaplain

## 2017-01-04 NOTE — Progress Notes (Signed)
Inpatient Rehabilitation  I have received insurance authorization for IP Rehab admission and have a bed available to offer patient today.  I have notified dialysis that patient will begin our IP Rehab program today and that she will be here for peritoneal dialysis tonight and have requested that she be off at 8:30 tomorrow morning to allow for therapies to begin at 9:00am.  Have updated team and plan to proceed with admission.  I have updated patient's brother-in-law, Mariyanna Mucha to notify of IP Rehab bed offer.  Please call with questions.   Carmelia Roller., CCC/SLP Admission Coordinator  [place]  Cell [phone]

## 2017-01-04 NOTE — Progress Notes (Signed)
Inpatient Rehabilitation  I have requested that PT and OT see patient this morning in order to obtain insurance authorization with [facility] for IP Rehab.  Plan to update the team as I know.  Please call with questions.   Carmelia Roller., CCC/SLP Admission Coordinator  [place]  Cell [phone]

## 2017-01-04 NOTE — Progress Notes (Signed)
Subjective:  Just finished some PT , No cos , Tolerated PD last Pm 1940 cc uf. Speech is better than the last time I saw her  Objective Vital signs in last 24 hours: Vitals:   01/04/17 0125 01/04/17 0500 01/04/17 0600 01/04/17 0815  BP: 132/78  129/86 (!) 143/93  Pulse: 72  77 79  Resp: 18  16 18   Temp: 98.2 F (36.8 C)  97.5 F (36.4 C) 98.6 F (37 C)  TempSrc: Axillary  Oral Oral  SpO2: 95%  94%   Weight:  54.4 kg (119 lb 14.9 oz)  50.3 kg (110 lb 14.3 oz)  Height:       Weight change: 0.8 kg (1 lb 12.2 oz)  Physical Exam: General: alert Thin , chronically ill AAF , OX3 Heart: RRR, 1/6 systolic M. , No rub  Lungs: CTAB Abdomen: + BS. Soft , NT , PD cath RUQ Drsg intact Extremities: No LE edema.  Dialysis Access: RUA AVG + bruit with small aneurysm  Summary: Pt is a 51 y.o.yo femalewith ESRD who was admitted on 6/16/2018with sz/hemiparesis- found to have right sided frontal lobe hemorrhage. Now with PRES. Seizures controlled in [place]. ? / had cardiac arrest 12/25/16 in Inpt HD    Problem/Plan: 1. Intracranial CVA/ hemorrhage- with L hemiparesis. Did not require surgery. PRES was also present. Was making some clinical recovery then had cardiac arrest at inpt dialysis on 6/22. Now awaiting rehab disposition. 2. ESRD- tolerating CCPD / wt  To 50.3 kg last PD ("old EDW 59") hemodialysis was complicated by arrest so she is now back on PD. Pt refusing return to HD. Per notes her family is willing to take over home PD for pt. They live near [place] and I believe they are actively trying to make arrangements for transition to another PD center closer to their home.  3. Volume/ HTN - volume down w/ higher PD UF, lowered BP meds some/ no w amlodipine 5 mg bid/ Carvedilol 12.5 mg bid. Losartan 50 mg hs  4. Cardiac arrest - while on HD 6/22 5. Anemia- history of GIB and low hgb.  Responded toowell to ESA and and Hb now > 15. Thisis part of reason for hypertension. Holding ESA  now.  6. MBD of CKD- ca ^  Hold vit d , last phos 9.4  On 12/27/16  have resumed phoslo and sensipar / reck am renal labs- will check PTH in AM 7.  Dispo - awaiting possible rehab transfer   Ernest Haber, PA-C [place] [phone] 01/04/2017,9:50 AM  LOS: 16 days    Patient seen and examined, agree with above note with above modifications. Making progress.  PD going fine- BP controlled. Hoping for admission to inpatient rehab soon  [name], MD 01/04/2017       Labs: Basic Metabolic Panel:  Recent Labs Lab 12/31/16 0256 01/02/17 0303 01/04/17 0238  NA 136 139 136  K 3.7 3.8 3.7  CL 98* 99* 96*  CO2 25 27 27   GLUCOSE 119* 130* 112*  BUN 49* 47* 55*  CREATININE 10.53* 11.39* 12.59*  CALCIUM 10.0 10.6* 10.5*   Liver Function Tests: No results for input(s): AST, ALT, ALKPHOS, BILITOT, PROT, ALBUMIN in the last 168 hours. No results for input(s): LIPASE, AMYLASE in the last 168 hours. No results for input(s): AMMONIA in the last 168 hours. CBC:  Recent Labs Lab 01/01/17 0623  WBC 10.3  HGB 16.9*  HCT 54.2*  MCV 103.2*  PLT 352  Cardiac Enzymes: No results for input(s): CKTOTAL, CKMB, CKMBINDEX, TROPONINI in the last 168 hours. CBG: No results for input(s): GLUCAP in the last 168 hours.  Studies/Results: No results found. Medications: . dialysis solution 2.5% low-MG/low-CA    . dialysis solution 4.25% low-MG/low-CA     . amLODipine  5 mg Oral BID  . calcium acetate  1,334 mg Oral TID WC  . carvedilol  12.5 mg Oral BID WC  . cinacalcet  60 mg Oral Q supper  . famotidine  20 mg Oral Daily  . feeding supplement (NEPRO CARB STEADY)  237 mL Oral BID BM  . gentamicin cream  1 application Topical Daily  . levETIRAcetam  500 mg Oral BID  . levothyroxine  88 mcg Oral QAC breakfast  . losartan  50 mg Oral QHS  . mouth rinse  15 mL Mouth Rinse BID  . multivitamin  1 tablet Oral Daily  . pantoprazole  40 mg Oral BID  .  polyethylene glycol  17 g Oral Daily  . senna-docusate  1 tablet Oral BID

## 2017-01-04 NOTE — Progress Notes (Signed)
  Speech Language Pathology Treatment: Dysphagia  Patient Details Name: Terry Juarez MRN: [id number] DOB: 1966/05/16 Today's Date: 01/04/2017 Time: 7673-4193 SLP Time Calculation (min) (ACUTE ONLY): 15 min  Assessment / Plan / Recommendation Clinical Impression  Pt consumed solids and liquids with no overt signs of aspiration including throat clearing that has frequently been observed during intake. She subjectively has no complaints of difficulty and misses being able to have salads. Recommend advancement to regular textures and thin liquids with brief SLP f/u for tolerance. SLP will continue to follow for cognitive goals as well with flat affect and slower processing observed throughout session, although pt was able to recall all items from her breakfast tray with Mod I. She continues to show good progress and would benefit from CIR to maximize functional independence.   HPI HPI: 51 yo F with history of ESRD on Peritoneal Dialysis, HTN, Right renal mass, and Anemia, who was found down on the floor seizing. She was found to have a large right frontal hemorrhage with surrounding edema and mass effect, also with small SAH and signs of PRES. Pt had MBS 6/17 recommending regular diet and thin liquids. Patient's status declined requiring intubation on 6/21-6/22 and so SLP ordered to re-assess swallow function s/p this intubation.      SLP Plan  Continue with current plan of care       Recommendations  Diet recommendations: Regular;Thin liquid Liquids provided via: Cup;Straw Medication Administration: Whole meds with liquid Supervision: Patient able to self feed;Intermittent supervision to cue for compensatory strategies Compensations: Slow rate;Small sips/bites Postural Changes and/or Swallow Maneuvers: Seated upright 90 degrees                Oral Care Recommendations: Oral care BID Follow up Recommendations: Inpatient Rehab SLP Visit Diagnosis: Dysphagia, unspecified (R13.10) Plan:  Continue with current plan of care       GO                Germain Osgood 01/04/2017, 11:24 AM  Germain Osgood, M.A. CCC-SLP [phone]

## 2017-01-04 NOTE — Progress Notes (Signed)
Patient transferred to 8M room 1 by bed.  All belongings with patient.

## 2017-01-04 NOTE — PMR Pre-admission (Signed)
PMR Admission Coordinator Pre-Admission Assessment  Patient: Terry Juarez is an 51 y.o., female MRN: [id number] DOB: [birth year] Height: 5\' 5"  (165.1 cm) Weight: 50.3 kg (110 lb 14.3 oz)              Insurance Information HMO:     PPO: X     PCP:      IPA:      80/20:      OTHER:  PRIMARY: [employer] [place]      Policy#: [id number]      Subscriber: Self CM Name: Harrie Jeans      Phone#: [phone]     Fax#: [phone] Pre-Cert#: [id number]      Employer: Full Time, [employer]  Benefits:  Phone #: [phone]     Name: Keane Scrape, reference #[id number] Eff. Date: 07/06/16     Deduct: $3500      Out of Pocket Max: $6350      Life Max: N/A CIR: 70%/30%      SNF: 70%/30% Outpatient: PT/OT/SLP 30 visits each     Co-Pay: $25 per visit  Home Health: 70%      Co-Pay: 30% DME: 70%     Co-Pay: 30% Providers: In network   SECONDARY: Medicare A & B      Policy#: [id number]      Subscriber: Self CM Name:       Phone#:      Fax#:  Pre-Cert#: Eligible via [employer]       Employer: Full Time Benefits:  Phone #:      Name:  Eff. Date: A:10/05/14 B:01/04/16     Deduct:       Out of Pocket Max:       Life Max:  CIR:       SNF:  Outpatient:      Co-Pay:  Home Health:       Co-Pay:  DME:      Co-Pay:   Medicaid Application Date:       Case Manager:  Disability Application Date:       Case Worker:   Emergency Contact Information Contact Information    Name Relation Home Work Mobile   [name],Robert    [place]    [phone]   Jamila, [name] Relative [phone]     Vianey, Caniglia  [phone]     Renato Gails Father [phone]     Allred,Darrell Friend [phone]       Current Medical History  Patient Admitting Diagnosis: Right frontal hemorrhage, small SAH, and signs of PRES   History of Present Illness: Terry Juarez a 51 y.o.right handed femalewith history of hypertension, history of GI bleed, ESRD with home peritoneal dialysis, right renal  cancer status post nephrectomy.Per chart review and mother-in-law report patient lives in [place] with a roommate with plans to discharge to her Brother and sister-in-law's home in [place], [place] after [employer]. She presented 12/19/2016 with new onset of seizure, acute left-sided weakness and right frontal headache. UDS positive for benzos. Troponin 1.21. CT scan imaging showed a 29 mm right frontal hemorrhage with surrounding edema and mass effect, also a small SAH and signs of PRES. CTA head and neck showed no AVMs but showed the hematoma was mildly increased from the earlier imaging. Maintained on nicardipine drip as well as Keppra. MRI of the brain reviewed, showing stable hemorrhage. Latest cranial CT head 12/24/2016 reviewed, stable. EEG showed diffuse cerebral dysfunction no seizure activity. Dialysis ongoing as per renal services with plans  for them to set-up home training for caregivers as well as connect them with services closer to their home post IP Rehab stay.  Therapy evaluations completed 12/22/2016 with recommendations of physical medicine rehabilitation consult. Patient was admitted for a comprehensive rehabilitation program 01/04/17.   NIH Total: 9    Past Medical History  Past Medical History:  Diagnosis Date  . Anemia   . Bruises easily   . Dialysis patient ([place])   . ESRD on dialysis ([place])   . Hyperlipidemia   . Hypertension   . Renal disorder    rt renal mass / < functioning of left kidney - being prepared for possible dialysis  . Right renal mass     Family History  family history includes Hypertension in her father; Throat cancer in her mother.  Prior Rehab/Hospitalizations:  Has the patient had major surgery during 100 days prior to admission? Yes, Thyroid surgery in April  Current Medications   Current Facility-Administered Medications:  .  acetaminophen (TYLENOL) tablet 650 mg, 650 mg, Oral, Q6H PRN, Dhungel, Nishant, MD, 650 mg at 12/29/16 1705 .  amLODipine  (NORVASC) tablet 5 mg, 5 mg, Oral, BID, Roney Jaffe, MD, 5 mg at 01/04/17 0845 .  bisacodyl (DULCOLAX) suppository 10 mg, 10 mg, Rectal, Daily PRN, Dhungel, Nishant, MD, 10 mg at 01/03/17 1304 .  calcium acetate (PHOSLO) capsule 1,334 mg, 1,334 mg, Oral, TID WC, [name], MD, 1,334 mg at 01/04/17 1204 .  carvedilol (COREG) tablet 12.5 mg, 12.5 mg, Oral, BID WC, Roney Jaffe, MD, 12.5 mg at 01/04/17 0846 .  cinacalcet (SENSIPAR) tablet 60 mg, 60 mg, Oral, Q supper, [name], MD, 60 mg at 01/03/17 1617 .  dialysis solution 2.5% low-MG/low-CA dianeal solution, , Intraperitoneal, Q24H, Dhungel, Nishant, MD .  dialysis solution 4.25% low-MG/low-CA dianeal solution, , Intraperitoneal, Q24H, Dhungel, Nishant, MD .  famotidine (PEPCID) tablet 20 mg, 20 mg, Oral, Daily, Rumbarger, Valeda Malm, RPH, 20 mg at 01/04/17 0845 .  feeding supplement (NEPRO CARB STEADY) liquid 237 mL, 237 mL, Oral, BID BM, Dhungel, Nishant, MD, 237 mL at 01/02/17 1056 .  gentamicin cream (GARAMYCIN) 0.1 % 1 application, 1 application, Topical, Daily, Roney Jaffe, MD .  heparin 2,500 Units in dialysis solution 2.5% low-MG/low-CA 5,000 mL dialysis solution, , Peritoneal Dialysis, PRN, Dhungel, Nishant, MD .  heparin 2,500 Units in dialysis solution 4.25% low-MG/low-CA 5,000 mL dialysis solution, , Peritoneal Dialysis, PRN, Dhungel, Nishant, MD .  levETIRAcetam (KEPPRA) tablet 500 mg, 500 mg, Oral, BID, Dhungel, Nishant, MD, 500 mg at 01/04/17 0846 .  levothyroxine (SYNTHROID, LEVOTHROID) tablet 88 mcg, 88 mcg, Oral, QAC breakfast, Allie Bossier, MD, 88 mcg at 01/04/17 0846 .  losartan (COZAAR) tablet 50 mg, 50 mg, Oral, QHS, Dhungel, Nishant, MD, 50 mg at 01/03/17 2258 .  MEDLINE mouth rinse, 15 mL, Mouth Rinse, BID, Dhungel, Nishant, MD, 15 mL at 01/03/17 2200 .  multivitamin (RENA-VIT) tablet 1 tablet, 1 tablet, Oral, Daily, Rosalin Hawking, MD, 1 tablet at 01/04/17 0846 .  pantoprazole (PROTONIX) EC  tablet 40 mg, 40 mg, Oral, BID, Allie Bossier, MD, 40 mg at 01/04/17 0845 .  polyethylene glycol (MIRALAX / GLYCOLAX) packet 17 g, 17 g, Oral, Daily, Dhungel, Nishant, MD, 17 g at 01/04/17 0845 .  senna-docusate (Senokot-S) tablet 1 tablet, 1 tablet, Oral, BID, Biby, Sharon L, NP, 1 tablet at 01/04/17 0846  Patients Current Diet: Diet regular Room service appropriate? Yes; Fluid consistency: Thin  Precautions / Restrictions Precautions Precautions: Fall Precaution  Comments: Peritoneal dialysis patient; L subluxed shoulder Restrictions Weight Bearing Restrictions: No   Has the patient had 2 or more falls or a fall with injury in the past year?No  Prior Activity Level Community (5-7x/wk): Prior to admission patient was living alone, working full time, and fully independent.    Home Assistive Devices / Equipment Home Assistive Devices/Equipment: Eyeglasses, Scales Home Equipment: Shower seat  Prior Device Use: Indicate devices/aids used by the patient prior to current illness, exacerbation or injury? None of the above  Prior Functional Level Prior Function Level of Independence: Independent Comments: Per mother-in-law  Self Care: Did the patient need help bathing, dressing, using the toilet or eating? Independent  Indoor Mobility: Did the patient need assistance with walking from room to room (with or without device)? Independent  Stairs: Did the patient need assistance with internal or external stairs (with or without device)? Independent  Functional Cognition: Did the patient need help planning regular tasks such as shopping or remembering to take medications? Independent  Current Functional Level Cognition  Arousal/Alertness: Lethargic Overall Cognitive Status: Impaired/Different from baseline Difficult to assess due to: Level of arousal Current Attention Level: Selective Orientation Level: Oriented X4 Following Commands: Follows one step commands  consistently Safety/Judgement: Decreased awareness of safety, Decreased awareness of deficits General Comments: trunk control affected by attention Attention: Sustained Sustained Attention: Impaired Sustained Attention Impairment: Verbal basic Memory: Impaired Memory Impairment: Decreased recall of new information, Retrieval deficit Awareness: Impaired Awareness Impairment: Emergent impairment, Anticipatory impairment Safety/Judgment: Impaired    Extremity Assessment (includes Sensation/Coordination)  Upper Extremity Assessment: LUE deficits/detail LUE Deficits / Details: No activation in L UE throughout session (0/5 strength). Unable to grasp hand. Reports no pain with movement but reports able to feel light touch in hand. No spasticity noted and full PROM present.   Lower Extremity Assessment: Defer to PT evaluation LLE Deficits / Details: Decreased strength and AROM consistent with diagnosis. Pt unable to lift LLE or bend L knee against gravity. Noted no movement of toes when asked to wiggle them. Noted hyperextension of the L knee during standing activity.     ADLs  Overall ADL's : Needs assistance/impaired Eating/Feeding: Supervision/ safety Eating/Feeding Details (indicate cue type and reason): with total A for balance while seated EOB Grooming: Sitting, Set up, Supervision/safety Grooming Details (indicate cue type and reason): sitting in front of sink Upper Body Bathing: Maximal assistance, Sitting Lower Body Bathing: Moderate assistance Lower Body Bathing Details (indicate cue type and reason): Pt using RUE for BLE. Hand over hand to facilitate weight bearing and movement LUE Upper Body Dressing : Moderate assistance Upper Body Dressing Details (indicate cue type and reason): Pt able to donn shirt over LUE and place R UE in arm hole appropriately. Lower Body Dressing: Total assistance, Bed level Toilet Transfer: Maximal assistance, +2 for physical assistance, +2 for  safety/equipment Toilet Transfer Details (indicate cue type and reason): simulated this session with sit to stand from [place] and Hygiene: Total assistance, Sit to/from stand Functional mobility during ADLs: Maximal assistance (sit - stand) General ADL Comments: Pt using mirror for trunk orientation during ADL. Pt states it feels good to do things for herself    Mobility  Overal bed mobility: Needs Assistance Bed Mobility: Rolling, Sidelying to Sit Rolling: Mod assist Sidelying to sit: Mod assist, HOB elevated Supine to sit: Mod assist, +2 for physical assistance Sit to sidelying: Max assist, +2 for physical assistance General bed mobility comments: rolled to R side; cues for  sequencing and technique; assist at L UE/LE when rolling; pt able to push up through R UE and use R LE to assist in sitting    Transfers  Overall transfer level: Needs assistance Equipment used:  (2 person face to face with gait belt) Transfers: Sit to/from Stand, Stand Pivot Transfers Sit to Stand: Mod assist, +2 physical assistance Stand pivot transfers: +2 physical assistance, Max assist Squat pivot transfers: Max assist, +2 safety/equipment General transfer comment: pt assisted with R UE/LE to power up into standing; L knee blocked during transfers and L UE supported by therapist (no sling yet); cues for sequencing and posture; assist required to pivot L foot while blocking knee    Ambulation / Gait / Stairs / Wheelchair Mobility  Ambulation/Gait General Gait Details: not ready    Posture / Balance Dynamic Sitting Balance Sitting balance - Comments: worked on righting posture to midline  Balance Overall balance assessment: Needs assistance Sitting-balance support: Feet supported, Single extremity supported Sitting balance-Leahy Scale: Poor Sitting balance - Comments: worked on righting posture to midline  Postural control: Left lateral lean, Posterior lean Standing  balance support: Bilateral upper extremity supported Standing balance-Leahy Scale: Zero Standing balance comment: L knee blocked or it will buckle    Special needs/care consideration BiPAP/CPAP: No CPM: No Continuous Drip IV: No Dialysis: Yes, peritoneal dialysis that she independently managed PTA; Renal to coordinate home training immediately following IP Rehab          Days: Daily-over night  Life Vest: No Oxygen: No Special Bed: No Trach Size: No Wound Vac (area): No       Skin: Ecchymosis right upper leg                               Bowel mgmt: Continent 01/03/17 Bladder mgmt: Continent  Diabetic mgmt: No     Previous Home Environment Living Arrangements: Non-relatives/Friends  Lives With: Other (Comment) (roommate) Available Help at Discharge: Family, Friend(s) Type of Home: House Home Layout: One level Home Access: Stairs to enter Technical brewer of Steps: 3 Bathroom Shower/Tub: Multimedia programmer: Associate Professor Accessibility: Yes Ulysses: No  Discharge Living Setting Plans for Discharge Living Setting: Other (Comment) (Will live with brother & sister-in-law, Herbie Baltimore & Talia Hoheisel) Type of Home at Discharge: Mobile home Discharge Home Layout: One level Discharge Home Access: Other (comment) (4 steps to enter with rails both side/unable to reach both at the same time, but ramp in process per recommendation from acute PT) Discharge Bathroom Shower/Tub: Tub/shower unit, Curtain Discharge Bathroom Toilet: Standard Discharge Bathroom Accessibility: Yes How Accessible: Accessible via walker Does the patient have any problems obtaining your medications?: No  Social/Family/Support Systems Patient Roles: Other (Comment) (Spouse died about 6 years ago and her in-laws are her family) Contact Information: Ginny Loomer: [phone]; Helia Haese [phone] Anticipated Caregiver: Herbie Baltimore and Charna Busman  Anticipated Caregiver's Contact  Information: see above they live in [place], [place] and need notice for being here for family training  Ability/Limitations of Caregiver: 24/7 upon discharge to their home Caregiver Availability: 24/7 Discharge Plan Discussed with Primary Caregiver: Yes Is Caregiver In Agreement with Plan?: Yes Does Caregiver/Family have Issues with Lodging/Transportation while Pt is in Rehab?: No  Goals/Additional Needs Patient/Family Goal for Rehab: PT/OT Min-Mod assist; SLP Min assist  Expected length of stay: 20-24 days  Cultural Considerations: None Dietary Needs: Regular textures and thin liquids  Equipment Needs: TBD Special Service  Needs: Peritoneal dialysis Additional Information: Renal to set-up outpatient home training immedicately after IP Rehab discharge, as well as renal resources near [place], [place] Pt/Family Agrees to Admission and willing to participate: Yes Program Orientation Provided & Reviewed with Pt/Caregiver Including Roles  & Responsibilities: Yes Additional Information Needs: Advanced notice for caregivers for equipment needs and family training as they live 3 hours away Information Needs to be Provided By: Team    Decrease burden of Care through IP rehab admission: No  Possible need for SNF placement upon discharge: No  Patient Condition: This patient's medical and functional status has changed since the consult dated: 12/23/16 in which the Rehabilitation Physician determined and documented that the patient's condition is appropriate for intensive rehabilitative care in an inpatient rehabilitation facility. See "History of Present Illness" (above) for medical update. Functional changes are: Mod-Max +2 transfers and regular textures, thin liquids per SLP. Patient's medical and functional status update has been discussed with the Rehabilitation physician and patient remains appropriate for inpatient rehabilitation. Will admit to inpatient rehab today.  Preadmission Screen Completed By:   Gunnar Fusi, 01/04/2017 1:55 PM ______________________________________________________________________   Discussed status with Dr. Posey Pronto on 01/04/17 at 1400 and received telephone approval for admission today.  Admission Coordinator:  Gunnar Fusi, time 1400/Date 01/04/17

## 2017-01-05 ENCOUNTER — Inpatient Hospital Stay (HOSPITAL_COMMUNITY): Payer: BLUE CROSS/BLUE SHIELD

## 2017-01-05 ENCOUNTER — Inpatient Hospital Stay (HOSPITAL_COMMUNITY): Payer: BLUE CROSS/BLUE SHIELD | Admitting: Speech Pathology

## 2017-01-05 ENCOUNTER — Inpatient Hospital Stay (HOSPITAL_COMMUNITY): Payer: BLUE CROSS/BLUE SHIELD | Admitting: Physical Therapy

## 2017-01-05 ENCOUNTER — Inpatient Hospital Stay (HOSPITAL_COMMUNITY): Payer: BLUE CROSS/BLUE SHIELD | Admitting: Occupational Therapy

## 2017-01-05 DIAGNOSIS — Z992 Dependence on renal dialysis: Secondary | ICD-10-CM

## 2017-01-05 DIAGNOSIS — N186 End stage renal disease: Secondary | ICD-10-CM

## 2017-01-05 DIAGNOSIS — D72829 Elevated white blood cell count, unspecified: Secondary | ICD-10-CM

## 2017-01-05 DIAGNOSIS — I10 Essential (primary) hypertension: Secondary | ICD-10-CM

## 2017-01-05 DIAGNOSIS — R739 Hyperglycemia, unspecified: Secondary | ICD-10-CM

## 2017-01-05 DIAGNOSIS — M7989 Other specified soft tissue disorders: Secondary | ICD-10-CM

## 2017-01-05 DIAGNOSIS — I629 Nontraumatic intracranial hemorrhage, unspecified: Secondary | ICD-10-CM

## 2017-01-05 MED ORDER — FLUOXETINE HCL 10 MG PO CAPS
10.0000 mg | ORAL_CAPSULE | Freq: Every day | ORAL | Status: DC
  Administered 2017-01-05 – 2017-01-11 (×7): 10 mg via ORAL
  Filled 2017-01-05 (×7): qty 1

## 2017-01-05 MED ORDER — GENTAMICIN SULFATE 0.1 % EX CREA
TOPICAL_CREAM | Freq: Once | CUTANEOUS | Status: AC
  Administered 2017-01-05: 07:00:00 via TOPICAL
  Filled 2017-01-05: qty 15

## 2017-01-05 NOTE — Progress Notes (Addendum)
[facility] Progress Note  Dialysis: PD 5 exchanges overnight, no day bag, no pause, 2000 volume, using all 2.5%  Summary: Pt is a 51 y.o.yo femalewith ESRD on PD who was admitted on 6/16/2018with sz/hemiparesis- found to have right sided frontal lobe hemorrhage, also with PRES. Seizures controlled in [place]. ? / had cardiac arrest 12/25/16 in Inpt HD. Transferred to Rehab 01/04/17.  Problem/Plan: 1. Intracranial CVA/ hemorrhage- with L hemiparesis. Did not require surgery. PRES was also present. Was making some clinical recovery then had cardiac arrest at inpt dialysis on 12/25/17 therefore transitioned back to PD 2. ESRD- tolerating CCPD  (previous  EDW 53) hemodialysis was complicated by arrest so she is now back on PD. Pt refusing return to HD. Per notes her family is willing to take overhome PD for pt. AM weights 48.8 7/2 and 49.7 7/3 K 4.1 They live near [place] and I believe they are actively trying to make arrangements for transition to another PD center closer to their home.  3. Volume/ HTN - volume down w/ higher PD concentrate - used  2.5 % yest- removed 1400- seems euvolemic- will change to 1.5% , lowered BP meds some/ no w amlodipine 5 mg bid/ Carvedilol 12.5 mg bid. Losartan 50 mg hs.  4. Cardiac arrest - while on HD 6/22 5. Anemia- history of GIB and low hgb. Responded toowell to ESA and and Hb now >15. Thisis part of reason for hypertension. Holding ESA now.  6. MBD -hypercalcemia Ca 10.9 P 4 - stop calcium acetate- add back at a lower dose later if P goes up or add Fe based binder, VDRA stopped 7/2 7. Nutrition   Alb 3 - regular diet ok, has Nepro and vits  [name], PA-C [place] [phone] 01/05/2017,10:29 AM  LOS: 1 day    Patient seen and examined, agree with above note with above modifications. No new issues. No problems with PD- will change to 1.5% fluids- family seems will be able to help her with PD after discharge  but will need to change centers to closer to Genella Rife, MD 01/05/2017  Subjective:  Nauseated some today  Objective Vitals:   01/04/17 1950 01/04/17 2205 01/05/17 0600 01/05/17 0740  BP:  129/87 133/87 121/80  Pulse:   74 70  Resp:   18 18  Temp:   98.2 F (36.8 C) 97.8 F (36.6 C)  TempSrc:   Oral Oral  SpO2:   100% 100%  Weight: 48.8 kg (107 lb 9.4 oz)  53.3 kg (117 lb 8.1 oz) 49.7 kg (109 lb 9.1 oz)  Height:       Physical Exam General: NAD Heart: RRR Lungs: no rales Abdomen: soft NT Extremities: no LE edema Dialysis Access: right AVGG + bruit and PD cath  Additional Objective Labs: Basic Metabolic Panel:  Recent Labs Lab 01/02/17 0303 01/04/17 0238 01/04/17 2148  NA 139 136 135  K 3.8 3.7 4.1  CL 99* 96* 94*  CO2 27 27 25   GLUCOSE 130* 112* 117*  BUN 47* 55* 55*  CREATININE 11.39* 12.59* 12.13*  CALCIUM 10.6* 10.5* 10.9*  PHOS  --   --  4.0   Liver Function Tests:  Recent Labs Lab 01/04/17 2148  ALBUMIN 3.0*   CBC:  Recent Labs Lab 01/01/17 0623 01/04/17 2148  WBC 10.3 14.3*  HGB 16.9* 17.2*  HCT 54.2* 54.3*  MCV 103.2* 103.0*  PLT 352 313   Blood Culture  Component Value Date/Time   SDES BLOOD LEFT HAND 07/30/2016 0537   SPECREQUEST BOTTLES DRAWN AEROBIC ONLY 5CC 07/30/2016 0537   CULT NO GROWTH 5 DAYS 07/30/2016 0537   REPTSTATUS 08/04/2016 FINAL 07/30/2016 0537    Cardiac Enzymes: No results for input(s): CKTOTAL, CKMB, CKMBINDEX, TROPONINI in the last 168 hours. CBG: No results for input(s): GLUCAP in the last 168 hours. Iron Studies: No results for input(s): IRON, TIBC, TRANSFERRIN, FERRITIN in the last 72 hours. Lab Results  Component Value Date   INR 0.93 12/21/2016   INR 1.88 12/19/2016   INR 0.97 12/19/2016   Studies/Results: No results found. Medications: . sodium chloride    . sodium chloride    . dialysis solution 2.5% low-MG/low-CA    . dialysis solution 4.25% low-MG/low-CA     .  amLODipine  5 mg Oral BID  . calcium acetate  1,334 mg Oral TID WC  . carvedilol  12.5 mg Oral BID WC  . cinacalcet  60 mg Oral Q supper  . famotidine  20 mg Oral Daily  . feeding supplement (NEPRO CARB STEADY)  237 mL Oral BID BM  . FLUoxetine  10 mg Oral Daily  . levETIRAcetam  500 mg Oral BID  . levothyroxine  88 mcg Oral QAC breakfast  . losartan  50 mg Oral QHS  . multivitamin  1 tablet Oral Daily  . pantoprazole  40 mg Oral BID  . polyethylene glycol  17 g Oral Daily  . senna-docusate  1 tablet Oral BID

## 2017-01-05 NOTE — Progress Notes (Signed)
Gunnar Fusi Rehab Admission Coordinator Signed Physical Medicine and Rehabilitation  PMR Pre-admission Date of Service: 01/04/2017 1:37 PM  Related encounter: ED to Hosp-Admission (Discharged) from 12/19/2016 in [place]       [] Hide copied text PMR Admission Coordinator Pre-Admission Assessment  Patient: Terry Juarez is an 51 y.o., female MRN: [id number] DOB: 03-29-1966 Height: 5\' 5"  (165.1 cm) Weight: 50.3 kg (110 lb 14.3 oz)                                                                                                                                                  Insurance Information HMO:     PPO: X     PCP:      IPA:      80/20:      OTHER:  PRIMARY: [employer] [place]      Policy#: [id number]      Subscriber: Self CM Name: Harrie Jeans      Phone#: [phone]     Fax#: [phone] Pre-Cert#: [id number]      Employer: Full Time, [employer]  Benefits:  Phone #: [phone]     Name: Keane Scrape, reference #[id number] Eff. Date: 07/06/16     Deduct: $3500      Out of Pocket Max: $6350      Life Max: N/A CIR: 70%/30%      SNF: 70%/30% Outpatient: PT/OT/SLP 30 visits each     Co-Pay: $25 per visit  Home Health: 70%      Co-Pay: 30% DME: 70%     Co-Pay: 30% Providers: In network   SECONDARY: Medicare A & B      Policy#: [id number]      Subscriber: Self CM Name:       Phone#:      Fax#:  Pre-Cert#: Eligible via [employer]       Employer: Full Time Benefits:  Phone #:      Name:  Eff. Date: A:10/05/14 B:01/04/16     Deduct:       Out of Pocket Max:       Life Max:  CIR:       SNF:  Outpatient:      Co-Pay:  Home Health:       Co-Pay:  DME:      Co-Pay:   Medicaid Application Date:       Case Manager:  Disability Application Date:       Case Worker:   Emergency Contact Information        Contact Information    Name Relation Home Work Mobile   Pinckney,Robert    Ames    [phone]    Kenlea, Woodell Relative Shafer     Renato Gails Father [phone]     Allred,Darrell Denman George  [phone]       Current Medical History  Patient Admitting Diagnosis: Right frontal hemorrhage, small SAH,and signs of PRES   History of Present Illness: Diamante D Mooreis a 51 y.o.right handed femalewith history of hypertension, history of GI bleed,ESRD with home peritoneal dialysis, right renal cancer status post nephrectomy.Per chart review and mother-in-law report patient lives in [place] with a roommate with plans to discharge to her Brother and sister-in-law's home in [place], [place] after [employer]. She presented 12/19/2016 with new onset of seizure, acute left-sided weakness and right frontal headache.UDS positive for benzos.Troponin 1.21. CT scan imaging showed a 29 mm right frontal hemorrhage with surrounding edema and mass effect, also a small SAH and signs of PRES. CTA head and neck showed no AVMs but showed the hematoma was mildly increased from the earlier imaging. Maintained on nicardipine drip as well as Keppra. MRI of the brain reviewed, showing stable hemorrhage.Latest cranial CThead 12/24/2016 reviewed, stable. EEG showed diffuse cerebral dysfunction no seizure activity. Dialysisongoing as per renal services with plans for them to set-up home training for caregivers as well as connect them with services closer to their home post IP Rehab stay.  Therapy evaluations completed 12/22/2016 with recommendations of physical medicine rehabilitation consult. Patient was admitted for a comprehensive rehabilitation program 01/04/17.   NIH Total: 9  Past Medical History      Past Medical History:  Diagnosis Date  . Anemia   . Bruises easily   . Dialysis patient ([facility])   . ESRD on dialysis ([facility])   . Hyperlipidemia   . Hypertension   . Renal disorder    rt renal mass / < functioning of left kidney - being prepared for  possible dialysis  . Right renal mass     Family History  family history includes Hypertension in her father; Throat cancer in her mother.  Prior Rehab/Hospitalizations:  Has the patient had major surgery during 100 days prior to admission? Yes, Thyroid surgery in April  Current Medications   Current Facility-Administered Medications:  .  acetaminophen (TYLENOL) tablet 650 mg, 650 mg, Oral, Q6H PRN, Dhungel, Nishant, MD, 650 mg at 12/29/16 1705 .  amLODipine (NORVASC) tablet 5 mg, 5 mg, Oral, BID, Roney Jaffe, MD, 5 mg at 01/04/17 0845 .  bisacodyl (DULCOLAX) suppository 10 mg, 10 mg, Rectal, Daily PRN, Dhungel, Nishant, MD, 10 mg at 01/03/17 1304 .  calcium acetate (PHOSLO) capsule 1,334 mg, 1,334 mg, Oral, TID WC, [name], MD, 1,334 mg at 01/04/17 1204 .  carvedilol (COREG) tablet 12.5 mg, 12.5 mg, Oral, BID WC, Roney Jaffe, MD, 12.5 mg at 01/04/17 0846 .  cinacalcet (SENSIPAR) tablet 60 mg, 60 mg, Oral, Q supper, [name], MD, 60 mg at 01/03/17 1617 .  dialysis solution 2.5% low-MG/low-CA dianeal solution, , Intraperitoneal, Q24H, Dhungel, Nishant, MD .  dialysis solution 4.25% low-MG/low-CA dianeal solution, , Intraperitoneal, Q24H, Dhungel, Nishant, MD .  famotidine (PEPCID) tablet 20 mg, 20 mg, Oral, Daily, Rumbarger, Valeda Malm, RPH, 20 mg at 01/04/17 0845 .  feeding supplement (NEPRO CARB STEADY) liquid 237 mL, 237 mL, Oral, BID BM, Dhungel, Nishant, MD, 237 mL at 01/02/17 1056 .  gentamicin cream (GARAMYCIN) 0.1 % 1 application, 1 application, Topical, Daily, Roney Jaffe, MD .  heparin 2,500 Units in dialysis solution 2.5% low-MG/low-CA 5,000 mL dialysis solution, , Peritoneal Dialysis, PRN, Dhungel, Nishant, MD .  heparin 2,500 Units in dialysis solution 4.25% low-MG/low-CA 5,000 mL dialysis solution, , Peritoneal Dialysis, PRN, Dhungel, Nishant, MD .  levETIRAcetam (KEPPRA) tablet 500 mg, 500 mg, Oral, BID, Dhungel, Nishant, MD, 500 mg at  01/04/17 0846 .  levothyroxine (SYNTHROID, LEVOTHROID) tablet 88 mcg, 88 mcg, Oral, QAC breakfast, Allie Bossier, MD, 88 mcg at 01/04/17 0846 .  losartan (COZAAR) tablet 50 mg, 50 mg, Oral, QHS, Dhungel, Nishant, MD, 50 mg at 01/03/17 2258 .  MEDLINE mouth rinse, 15 mL, Mouth Rinse, BID, Dhungel, Nishant, MD, 15 mL at 01/03/17 2200 .  multivitamin (RENA-VIT) tablet 1 tablet, 1 tablet, Oral, Daily, Rosalin Hawking, MD, 1 tablet at 01/04/17 0846 .  pantoprazole (PROTONIX) EC tablet 40 mg, 40 mg, Oral, BID, Allie Bossier, MD, 40 mg at 01/04/17 0845 .  polyethylene glycol (MIRALAX / GLYCOLAX) packet 17 g, 17 g, Oral, Daily, Dhungel, Nishant, MD, 17 g at 01/04/17 0845 .  senna-docusate (Senokot-S) tablet 1 tablet, 1 tablet, Oral, BID, Biby, Sharon L, NP, 1 tablet at 01/04/17 0846  Patients Current Diet: Diet regular Room service appropriate? Yes; Fluid consistency: Thin  Precautions / Restrictions Precautions Precautions: Fall Precaution Comments: Peritoneal dialysis patient; L subluxed shoulder Restrictions Weight Bearing Restrictions: No   Has the patient had 2 or more falls or a fall with injury in the past year?No  Prior Activity Level Community (5-7x/wk): Prior to admission patient was living alone, working full time, and fully independent.    Home Assistive Devices / Equipment Home Assistive Devices/Equipment: Eyeglasses, Scales Home Equipment: Shower seat  Prior Device Use: Indicate devices/aids used by the patient prior to current illness, exacerbation or injury? None of the above  Prior Functional Level Prior Function Level of Independence: Independent Comments: Per mother-in-law  Self Care: Did the patient need help bathing, dressing, using the toilet or eating? Independent  Indoor Mobility: Did the patient need assistance with walking from room to room (with or without device)? Independent  Stairs: Did the patient need assistance with internal or external stairs  (with or without device)? Independent  Functional Cognition: Did the patient need help planning regular tasks such as shopping or remembering to take medications? Independent  Current Functional Level Cognition  Arousal/Alertness: Lethargic Overall Cognitive Status: Impaired/Different from baseline Difficult to assess due to: Level of arousal Current Attention Level: Selective Orientation Level: Oriented X4 Following Commands: Follows one step commands consistently Safety/Judgement: Decreased awareness of safety, Decreased awareness of deficits General Comments: trunk control affected by attention Attention: Sustained Sustained Attention: Impaired Sustained Attention Impairment: Verbal basic Memory: Impaired Memory Impairment: Decreased recall of new information, Retrieval deficit Awareness: Impaired Awareness Impairment: Emergent impairment, Anticipatory impairment Safety/Judgment: Impaired    Extremity Assessment (includes Sensation/Coordination)  Upper Extremity Assessment: LUE deficits/detail LUE Deficits / Details: No activation in L UE throughout session (0/5 strength). Unable to grasp hand. Reports no pain with movement but reports able to feel light touch in hand. No spasticity noted and full PROM present.   Lower Extremity Assessment: Defer to PT evaluation LLE Deficits / Details: Decreased strength and AROM consistent with diagnosis. Pt unable to lift LLE or bend L knee against gravity. Noted no movement of toes when asked to wiggle them. Noted hyperextension of the L knee during standing activity.     ADLs  Overall ADL's : Needs assistance/impaired Eating/Feeding: Supervision/ safety Eating/Feeding Details (indicate cue type and reason): with total A for balance while seated EOB Grooming: Sitting, Set up, Supervision/safety Grooming Details (indicate cue type and reason): sitting in front of sink Upper Body Bathing: Maximal assistance, Sitting Lower Body Bathing:  Moderate assistance Lower Body Bathing  Details (indicate cue type and reason): Pt using RUE for BLE. Hand over hand to facilitate weight bearing and movement LUE Upper Body Dressing : Moderate assistance Upper Body Dressing Details (indicate cue type and reason): Pt able to donn shirt over LUE and place R UE in arm hole appropriately. Lower Body Dressing: Total assistance, Bed level Toilet Transfer: Maximal assistance, +2 for physical assistance, +2 for safety/equipment Toilet Transfer Details (indicate cue type and reason): simulated this session with sit to stand from [place] and Hygiene: Total assistance, Sit to/from stand Functional mobility during ADLs: Maximal assistance (sit - stand) General ADL Comments: Pt using mirror for trunk orientation during ADL. Pt states it feels good to do things for herself    Mobility  Overal bed mobility: Needs Assistance Bed Mobility: Rolling, Sidelying to Sit Rolling: Mod assist Sidelying to sit: Mod assist, HOB elevated Supine to sit: Mod assist, +2 for physical assistance Sit to sidelying: Max assist, +2 for physical assistance General bed mobility comments: rolled to R side; cues for sequencing and technique; assist at L UE/LE when rolling; pt able to push up through R UE and use R LE to assist in sitting    Transfers  Overall transfer level: Needs assistance Equipment used:  (2 person face to face with gait belt) Transfers: Sit to/from Stand, Stand Pivot Transfers Sit to Stand: Mod assist, +2 physical assistance Stand pivot transfers: +2 physical assistance, Max assist Squat pivot transfers: Max assist, +2 safety/equipment General transfer comment: pt assisted with R UE/LE to power up into standing; L knee blocked during transfers and L UE supported by therapist (no sling yet); cues for sequencing and posture; assist required to pivot L foot while blocking knee    Ambulation / Gait / Stairs / Wheelchair  Mobility  Ambulation/Gait General Gait Details: not ready    Posture / Balance Dynamic Sitting Balance Sitting balance - Comments: worked on righting posture to midline  Balance Overall balance assessment: Needs assistance Sitting-balance support: Feet supported, Single extremity supported Sitting balance-Leahy Scale: Poor Sitting balance - Comments: worked on righting posture to midline  Postural control: Left lateral lean, Posterior lean Standing balance support: Bilateral upper extremity supported Standing balance-Leahy Scale: Zero Standing balance comment: L knee blocked or it will buckle    Special needs/care consideration BiPAP/CPAP: No CPM: No Continuous Drip IV: No Dialysis: Yes, peritoneal dialysis that she independently managed PTA; Renal to coordinate home training immediately following IP Rehab          Days: Daily-over night  Life Vest: No Oxygen: No Special Bed: No Trach Size: No Wound Vac (area): No       Skin: Ecchymosis right upper leg                               Bowel mgmt: Continent 01/03/17 Bladder mgmt: Continent  Diabetic mgmt: No     Previous Home Environment Living Arrangements: Non-relatives/Friends  Lives With: Other (Comment) (roommate) Available Help at Discharge: Family, Friend(s) Type of Home: House Home Layout: One level Home Access: Stairs to enter Technical brewer of Steps: 3 Bathroom Shower/Tub: Multimedia programmer: Associate Professor Accessibility: Yes [place]: No  Discharge Living Setting Plans for Discharge Living Setting: Other (Comment) (Will live with brother & sister-in-law, Herbie Baltimore & Maryjayne Kleven) Type of Home at Discharge: Mobile home Discharge Home Layout: One level Discharge Home Access: Other (comment) (4 steps to enter  with rails both side/unable to reach both at the same time, but ramp in process per recommendation from acute PT) Discharge Bathroom Shower/Tub: Tub/shower unit,  Curtain Discharge Bathroom Toilet: Standard Discharge Bathroom Accessibility: Yes How Accessible: Accessible via walker Does the patient have any problems obtaining your medications?: No  Social/Family/Support Systems Patient Roles: Other (Comment) (Spouse died about 6 years ago and her in-laws are her family) Contact Information: Thyra Yinger: [phone]; Mason Burleigh [phone] Anticipated Caregiver: Herbie Baltimore and Charna Busman  Anticipated Caregiver's Contact Information: see above they live in [place], [place] and need notice for being here for family training  Ability/Limitations of Caregiver: 24/7 upon discharge to their home Caregiver Availability: 24/7 Discharge Plan Discussed with Primary Caregiver: Yes Is Caregiver In Agreement with Plan?: Yes Does Caregiver/Family have Issues with Lodging/Transportation while Pt is in Rehab?: No  Goals/Additional Needs Patient/Family Goal for Rehab: PT/OT Min-Mod assist; SLP Min assist  Expected length of stay: 20-24 days  Cultural Considerations: None Dietary Needs: Regular textures and thin liquids  Equipment Needs: TBD Special Service Needs: Peritoneal dialysis Additional Information: Renal to set-up outpatient home training immedicately after IP Rehab discharge, as well as renal resources near [place], [place] Pt/Family Agrees to Admission and willing to participate: Yes Program Orientation Provided & Reviewed with Pt/Caregiver Including Roles  & Responsibilities: Yes Additional Information Needs: Advanced notice for caregivers for equipment needs and family training as they live 3 hours away Information Needs to be Provided By: Team    Decrease burden of Care through IP rehab admission: No  Possible need for SNF placement upon discharge: No  Patient Condition: This patient's medical and functional status has changed since the consult dated: 12/23/16 in which the Rehabilitation Physician determined and documented that the patient's condition  is appropriate for intensive rehabilitative care in an inpatient rehabilitation facility. See "History of Present Illness" (above) for medical update. Functional changes are: Mod-Max +2 transfers and regular textures, thin liquids per SLP. Patient's medical and functional status update has been discussed with the Rehabilitation physician and patient remains appropriate for inpatient rehabilitation. Will admit to inpatient rehab today.  Preadmission Screen Completed By:  Gunnar Fusi, 01/04/2017 1:55 PM ______________________________________________________________________   Discussed status with Dr. Posey Pronto on 01/04/17 at 1400 and received telephone approval for admission today.  Admission Coordinator:  Gunnar Fusi, time 1400/Date 01/04/17       Cosigned by: Jamse Arn, MD at 01/04/2017 2:15 PM  Revision History

## 2017-01-05 NOTE — Evaluation (Signed)
Occupational Therapy Assessment and Plan  Patient Details  Name: Terry Juarez MRN: [id number] Date of Birth: Dec 01, 1965  OT Diagnosis: abnormal posture, cognitive deficits, disturbance of vision, hemiplegia affecting non-dominant side and muscle weakness (generalized) Rehab Potential: Rehab Potential (ACUTE ONLY): Excellent ELOS: 25-28 days   Today's Date: 01/05/2017 OT Individual Time: 0900-1000 OT Individual Time Calculation (min): 60 min     Problem List:  Patient Active Problem List   Diagnosis Date Noted  . ESRD on dialysis ([place])   . Leukocytosis   . Hyperglycemia   . Hypertensive emergency 01/04/2017  . Acute left hemiparesis ([place]) 01/04/2017  . Intracranial hemorrhage ([place]) 01/04/2017  . Subarachnoid hematoma ([place])   . Hypothyroidism   . Benign essential HTN   . Seizure prophylaxis   . Cardiac arrest, cause unspecified ([place])   . Acute respiratory failure with hypoxemia ([place])   . Acute encephalopathy   . ICH (intracerebral hemorrhage) ([place])   . Seizure ([place])   . PRES (posterior reversible encephalopathy syndrome)   . Subarachnoid hemorrhage 12/19/2016  . Symptomatic anemia 07/29/2016  . Cough 01/09/2016  . Elevated troponin 02/12/2015  . Headache 02/12/2015  . Syncope 02/12/2015  . ESRD on peritoneal dialysis ([place]) 02/12/2015  . Hypertension   . Hyperlipidemia   . Anemia   . Essential hypertension   . Cephalalgia   . Right renal mass 07/25/2014    Past Medical History:  Past Medical History:  Diagnosis Date  . Anemia   . Bruises easily   . Dialysis patient ([place])   . ESRD on dialysis ([place])   . Hyperlipidemia   . Hypertension   . Renal disorder    rt renal mass / < functioning of left kidney - being prepared for possible dialysis  . Right renal mass    Past Surgical History:  Past Surgical History:  Procedure Laterality Date  . AV FISTULA PLACEMENT Right 07/11/2014   Procedure: ARTERIOVENOUS (AV) FISTULA CREATION RIGHT ARM BRACHIO-CEPHALIC WITH  ATTEMPTED RADIO-CEPHALIC (AV) FISTULA;  Surgeon: Mal Misty, MD;  Location: [facility];  Service: Vascular;  Laterality: Right;  . COLONOSCOPY WITH PROPOFOL N/A 09/04/2016   Procedure: COLONOSCOPY WITH PROPOFOL;  Surgeon: Carol Ada, MD;  Location: [facility];  Service: Endoscopy;  Laterality: N/A;  . ECTOPIC PREGNANCY SURGERY  1987  . ESOPHAGOGASTRODUODENOSCOPY N/A 07/30/2016   Procedure: ESOPHAGOGASTRODUODENOSCOPY (EGD);  Surgeon: Carol Ada, MD;  Location: [facility] ENDOSCOPY;  Service: Endoscopy;  Laterality: N/A;  Bedside  . ESOPHAGOGASTRODUODENOSCOPY (EGD) WITH PROPOFOL N/A 09/04/2016   Procedure: ESOPHAGOGASTRODUODENOSCOPY (EGD) WITH PROPOFOL;  Surgeon: Carol Ada, MD;  Location: [facility];  Service: Endoscopy;  Laterality: N/A;  . INSERTION OF DIALYSIS CATHETER Right 07/11/2014   Procedure: INSERTION OF DIALYSIS CATHETER IN RIGHT INTERNAL JUGULAR ;  Surgeon: Mal Misty, MD;  Location: [facility];  Service: Vascular;  Laterality: Right;  . LAPAROSCOPIC NEPHRECTOMY Right 07/25/2014   Procedure: RIGHT LAPAROSCOPIC RADICAL NEPHRECTOMY ;  Surgeon: Ardis Hughs, MD;  Location: [facility];  Service: Urology;  Laterality: Right;  . PATCH ANGIOPLASTY Right 07/11/2014   Procedure: PATCH ANGIOPLASTY OF RIGHT RADIAL ARTERY USING CEPHALIC VEIN.;  Surgeon: Mal Misty, MD;  Location: [facility];  Service: Vascular;  Laterality: Right;  . THROMBECTOMY W/ EMBOLECTOMY Right 07/11/2014   Procedure: THROMBECTOMY OF RIGHT RADIAL ARTERY  ;  Surgeon: Mal Misty, MD;  Location: [place];  Service: Vascular;  Laterality: Right;    Assessment & Plan Clinical Impression: Terry Juarez is a 51 y.o.  right handed female with history of hypertension,history of GI bleed, ESRD with home peritoneal dialysis, right renal cancer status post nephrectomy. Per chart review and mother-in-law patient lives in [place] with a roommate. Independent prior to admission. One level home with 3 steps to entry. Question 24-hour assistance on  discharge. Presented 12/19/2016 with new onset of seizure, acute left-sided weakness and right frontal headache. UDS positive for benzos. Troponin 1.21. CT scan imaging showed a 29 mm right frontal hemorrhage with surrounding edema and mass effect, also a small SAH and signs of PRES. CTA head and neck showed no AVMs but showed the hematoma was mildly increased from the earlier imaging. Maintain on nicardipine drip as well as Keppra. MRI of the brain reviewed, showing stable hemorrhage. Latest cranial CT head 12/24/2016 reviewed, stable. EEG showed diffuse cerebral dysfunction no seizure activity. Dialysis ongoing as per renal services. Dysphagia #3 thin liquid diet. Therapy evaluations completed 12/22/2016 with recommendations of physical medicine rehabilitation consult.Patient was admitted for a comprehensive rehabilitation program  Patient transferred to CIR on 01/04/2017 .    Patient currently requires max with basic self-care skills secondary to decreased cardiorespiratoy endurance, unbalanced muscle activation, decreased visual acuity, decreased visual perceptual skills and decreased visual motor skills, decreased attention to left, decreased awareness, decreased problem solving, decreased safety awareness, decreased memory and delayed processing and decreased sitting balance, decreased standing balance, decreased postural control, hemiplegia and decreased balance strategies.  Prior to hospitalization, patient was working full time, driving and living with a roommate.  Patient will benefit from skilled intervention to increase independence with basic self-care skills prior to discharge home with care partner.  Anticipate patient will require intermittent supervision and follow up home health.  OT - End of Session Endurance Deficit: Yes OT Assessment Rehab Potential (ACUTE ONLY): Excellent OT Patient demonstrates impairments in the following area(s):  Balance;Cognition;Endurance;Motor;Perception;Safety;Vision OT Basic ADL's Functional Problem(s): Bathing;Dressing;Grooming;Toileting OT Transfers Functional Problem(s): Toilet;Tub/Shower OT Additional Impairment(s): Fuctional Use of Upper Extremity OT Plan OT Intensity: Minimum of 1-2 x/day, 45 to 90 minutes OT Frequency: 5 out of 7 days OT Duration/Estimated Length of Stay: 25-28 days OT Treatment/Interventions: Balance/vestibular training;Cognitive remediation/compensation;Discharge planning;DME/adaptive equipment instruction;Functional electrical stimulation;Functional mobility training;Neuromuscular re-education;Patient/family education;Self Care/advanced ADL retraining;Therapeutic Activities;Therapeutic Exercise;UE/LE Strength taining/ROM;UE/LE Coordination activities;Visual/perceptual remediation/compensation OT Self Feeding Anticipated Outcome(s): Independent OT Basic Self-Care Anticipated Outcome(s): supervision OT Toileting Anticipated Outcome(s): supervision OT Bathroom Transfers Anticipated Outcome(s): supervision OT Recommendation Patient destination: Home Follow Up Recommendations: Home health OT Equipment Recommended: To be determined   Skilled Therapeutic Intervention Pt seen for OT evaluation and ADL training with a focus on midline awareness, postural control, and hemiplegic adaptive techniques for self care.  Pt eager to participate and put in excellent effort during the session.  During transfers and during sit to stands for bathing and dressing she demonstrated left lean due to severe LLE weakness and decreased postural control. She also leans to the Left in unsupported seat.  With tactile and verbal cues she was able to correct with min A in sitting and mod-max A in standing.  Reviewed purpose of OT, estimated LOS, goals and general OT POC.  Pt provided with a narrow w/c and a half lap tray to support LUE.  Education on safe LUE handling during transfers as pt as a shoulder  subluxation. Pt resting in room in w/c with quick release belt on.  OT Evaluation Precautions/Restrictions  Precautions Precautions: Fall Precaution Comments: Peritoneal dialysis patient; L subluxed shoulder Restrictions Weight Bearing Restrictions: No   Pain Pain  Assessment Pain Assessment: No/denies pain Home Living/Prior Functioning Home Living Living Arrangements: Non-relatives/Friends, Parent, Other relatives (PTA, pt lived with a roommate and her father in pt's home. She plans to go with her brother-in-law and sister-in-law in [place] at d/c.) Available Help at Discharge: Family, Friend(s) (Will go home with brother in [employer] in [employer]) Type of Home: House Home Access: Stairs to enter Technical brewer of Steps: 3 Entrance Stairs-Rails:  (Pt states there is a rail but unclear which side ) Home Layout: One level Bathroom Shower/Tub: Walk-in shower  Lives With: Other (Comment) (Roomate) Prior Function Level of Independence: Independent with basic ADLs, Independent with gait, Independent with homemaking with ambulation  Able to Take Stairs?: Yes Driving: Yes Vocation: Full time employment Comments: per patient ADL ADL ADL Comments: refer to functional navigator Vision Baseline Vision/History: Wears glasses Wears Glasses: Distance only Patient Visual Report: Blurring of vision (pt stated that it is improving) Vision Assessment?: Yes Eye Alignment: Within Functional Limits Ocular Range of Motion: Within Functional Limits Alignment/Gaze Preference: Within Defined Limits Tracking/Visual Pursuits: Requires cues, head turns, or add eye shifts to track;Decreased smoothness of horizontal tracking Visual Fields: No apparent deficits Additional Comments: pt reports she had "blurry/double vision" but it has since improved Perception  Perception: Impaired Inattention/Neglect: Does not attend to left side of body Praxis Praxis: Intact Cognition Overall Cognitive Status:  Impaired/Different from baseline Arousal/Alertness: Lethargic Orientation Level: Person;Place;Situation Person: Oriented Place: Oriented Situation: Oriented Year: 2018 Month: July Day of Week: Correct Memory: Impaired Memory Impairment: Decreased recall of new information;Retrieval deficit Immediate Memory Recall: Sock;Blue;Bed Memory Recall: Sock;Blue;Bed Memory Recall Sock: Without Cue Memory Recall Blue: Without Cue Memory Recall Bed: Without Cue Attention: Sustained Sustained Attention: Impaired Sustained Attention Impairment: Verbal basic Awareness: Impaired Awareness Impairment: Emergent impairment;Anticipatory impairment Safety/Judgment: Impaired Sensation Sensation Light Touch: Appears Intact Stereognosis: Appears Intact Hot/Cold: Appears Intact Proprioception: Appears Intact Coordination Gross Motor Movements are Fluid and Coordinated: No (WFL on R, Impaired on L side due to hemiparesis) Fine Motor Movements are Fluid and Coordinated: No (WFL on R, Impaired on L side due to hemiparesis) Heel Shin Test: WFL on R, unable to test on L Motor  Motor Motor: Hemiplegia Mobility    refer to functional navigator Trunk/Postural Assessment  Cervical Assessment Cervical Assessment: Exceptions to [facility] (Increased forward head posture at rest) Thoracic Assessment Thoracic Assessment: Within Functional Limits Lumbar Assessment Lumbar Assessment: Within Functional Limits Postural Control Postural Control: Deficits on evaluation Trunk Control: decreased trunk control with increased lateral lean  Postural Limitations: decreased midline posture  Balance Balance Balance Assessed: Yes Static Sitting Balance Static Sitting - Level of Assistance: 4: Min assist Dynamic Sitting Balance Dynamic Sitting - Level of Assistance: 3: Mod assist Static Standing Balance Static Standing - Level of Assistance: 2: Max assist Dynamic Standing Balance Dynamic Standing - Level of Assistance:  2: Max assist Extremity/Trunk Assessment RUE Assessment RUE Assessment: Within Functional Limits LUE Assessment LUE Assessment: Exceptions to WFL (subluxation, decreased tone, trace movement in elbow flexors)   See Function Navigator for Current Functional Status.   Refer to Care Plan for Long Term Goals  Recommendations for other services: Neuropsych   Discharge Criteria: Patient will be discharged from OT if patient refuses treatment 3 consecutive times without medical reason, if treatment goals not met, if there is a change in medical status, if patient makes no progress towards goals or if patient is discharged from hospital.  The above assessment, treatment plan, treatment alternatives and goals were discussed and mutually  agreed upon: by patient  [facility] 01/05/2017, 12:48 PM

## 2017-01-05 NOTE — Progress Notes (Signed)
Jamse Arn, MD Physician Signed Physical Medicine and Rehabilitation  Consult Note Date of Service: 12/23/2016 12:52 PM  Related encounter: ED to Hosp-Admission (Discharged) from 12/19/2016 in [place] All Collapse All   [] Hide copied text [] Hover for attribution information      Physical Medicine and Rehabilitation Consult Reason for Consult: Left side weakness Referring Physician: Critical care   HPI: Terry Juarez is a 51 y.o. right handed female with history of hypertension, ESRD with home peritoneal dialysis, right renal cancer status post nephrectomy. Per chart review and mother-in-law patient lives in [place] with a roommate. Independent prior to admission. One level home with 3 steps to entry. Question 24-hour assistance on discharge. Presented 12/19/2016 with new onset of seizure, acute left-sided weakness and right frontal headache. Troponin 1.21. CT scan imaging showed a 29 mm right frontal hemorrhage with surrounding edema and mass effect, also a small SAH and signs of PRES. CTA head and neck showed no AVMs but showed the hematoma was mildly increased from the earlier imaging. Maintain on nicardipine drip as well as Keppra. MRI of the brain reviewed, showing stable hemorrhage. CT head reviewed, stable. EEG showed diffuse cerebral dysfunction no seizure activity. Hemodialysis ongoing as per renal services. Therapy evaluations completed 12/22/2016 with recommendations of physical medicine rehabilitation consult.  Review of Systems  Constitutional: Negative for chills and fever.  HENT: Negative for hearing loss.   Eyes: Negative for blurred vision and double vision.  Respiratory: Negative for cough and shortness of breath.   Cardiovascular: Positive for leg swelling. Negative for chest pain and palpitations.  Gastrointestinal: Positive for constipation. Negative for nausea and vomiting.  Musculoskeletal:  Positive for joint pain and myalgias.  Skin: Negative for rash.  Neurological: Positive for dizziness, speech change, focal weakness, weakness and headaches. Negative for sensory change.  All other systems reviewed and are negative.      Past Medical History:  Diagnosis Date  . Anemia   . Bruises easily   . Dialysis patient ([place])   . ESRD on dialysis ([place])   . Hyperlipidemia   . Hypertension   . Renal disorder    rt renal mass / < functioning of left kidney - being prepared for possible dialysis  . Right renal mass         Past Surgical History:  Procedure Laterality Date  . AV FISTULA PLACEMENT Right 07/11/2014   Procedure: ARTERIOVENOUS (AV) FISTULA CREATION RIGHT ARM BRACHIO-CEPHALIC WITH ATTEMPTED RADIO-CEPHALIC (AV) FISTULA;  Surgeon: Mal Misty, MD;  Location: [facility];  Service: Vascular;  Laterality: Right;  . COLONOSCOPY WITH PROPOFOL N/A 09/04/2016   Procedure: COLONOSCOPY WITH PROPOFOL;  Surgeon: Carol Ada, MD;  Location: [facility];  Service: Endoscopy;  Laterality: N/A;  . ECTOPIC PREGNANCY SURGERY  1987  . ESOPHAGOGASTRODUODENOSCOPY N/A 07/30/2016   Procedure: ESOPHAGOGASTRODUODENOSCOPY (EGD);  Surgeon: Carol Ada, MD;  Location[facility];  Service: Endoscopy;  Laterality: N/A;  Bedside  . ESOPHAGOGASTRODUODENOSCOPY (EGD) WITH PROPOFOL N/A 09/04/2016   Procedure: ESOPHAGOGASTRODUODENOSCOPY (EGD) WITH PROPOFOL;  Surgeon: Carol Ada, MD;  Location: [facility] ENDOSCOPY;  Service: Endoscopy;  Laterality: N/A;  . INSERTION OF DIALYSIS CATHETER Right 07/11/2014   Procedure: INSERTION OF DIALYSIS CATHETER IN RIGHT INTERNAL JUGULAR ;  Surgeon: Mal Misty, MD;  Location: [facility];  Service: Vascular;  Laterality: Right;  . LAPAROSCOPIC NEPHRECTOMY Right 07/25/2014   Procedure: RIGHT LAPAROSCOPIC RADICAL NEPHRECTOMY ;  Surgeon: Ardis Hughs, MD;  Location: [facility];  Service: Urology;  Laterality: Right;  . PATCH ANGIOPLASTY Right 07/11/2014   Procedure:  PATCH ANGIOPLASTY OF RIGHT RADIAL ARTERY USING CEPHALIC VEIN.;  Surgeon: Mal Misty, MD;  Location: [facility];  Service: Vascular;  Laterality: Right;  . THROMBECTOMY W/ EMBOLECTOMY Right 07/11/2014   Procedure: THROMBECTOMY OF RIGHT RADIAL ARTERY  ;  Surgeon: Mal Misty, MD;  Location: [facility] OR;  Service: Vascular;  Laterality: Right;        Family History  Problem Relation Age of Onset  . Throat cancer Mother        smoked  . Hypertension Father    Social History:  reports that she has never smoked. She has never used smokeless tobacco. She reports that she drinks alcohol. She reports that she does not use drugs. Allergies:      Allergies  Allergen Reactions  . Lisinopril Cough         Medications Prior to Admission  Medication Sig Dispense Refill  . acetaminophen (TYLENOL) 500 MG tablet Take 1,000 mg by mouth every 6 (six) hours as needed for moderate pain or headache.    Marland Kitchen amLODipine (NORVASC) 10 MG tablet Take 10 mg by mouth daily.     . calcitRIOL (ROCALTROL) 0.5 MCG capsule Take 0.5 mcg by mouth every Monday, Wednesday, and Friday.    . calcium acetate (PHOSLO) 667 MG capsule Take 667-2,001 mg by mouth See admin instructions. Take 3 capsules (2001 mg) by mouth three times daily with meals and take 1 capsule (667 mg) twice daily with snacks    . carvedilol (COREG) 12.5 MG tablet Take 12.5 mg by mouth 2 (two) times daily.  0  . cinacalcet (SENSIPAR) 60 MG tablet Take 60 mg by mouth at bedtime.    . cloNIDine (CATAPRES) 0.1 MG tablet Take 0.1 mg by mouth 3 (three) times daily as needed (SBP >180).   0  . levothyroxine (SYNTHROID, LEVOTHROID) 88 MCG tablet Take 88 mcg by mouth daily before breakfast.   0  . losartan (COZAAR) 100 MG tablet Take 100 mg by mouth at bedtime.   2  . multivitamin (RENA-VIT) TABS tablet Take 1 tablet by mouth daily.    [place] Calcium 500 MG TABS Take 500 mg by mouth 3 (three) times daily.  0  . sodium bicarbonate 650 MG  tablet Take 650 mg by mouth 2 (two) times daily.    . pantoprazole (PROTONIX) 40 MG tablet Take 1 tablet (40 mg total) by mouth 2 (two) times daily. (Patient not taking: Reported on 12/19/2016) 60 tablet 1    Home: [place] expects to be discharged to:: Private residence Living Arrangements: Non-relatives/Friends Available Help at Discharge: Family, Friend(s) Type of Home: House Home Access: Stairs to enter Technical brewer of Steps: 3 Home Layout: One level Bathroom Shower/Tub: Multimedia programmer: Associate Professor Accessibility: Yes Home Equipment: Civil engineer, contracting  Lives With: Other (Comment) (roommate)  Functional History: Prior Function Level of Independence: Independent Comments: Per mother-in-law Functional Status:  Mobility: Bed Mobility Overal bed mobility: Needs Assistance Bed Mobility: Supine to Sit Rolling: Max assist Sidelying to sit: Mod assist, +2 for physical assistance Supine to sit: Mod assist, +2 for physical assistance General bed mobility comments: Pt initiating LE movement towards EOB this session with semi-roll to the R. Assist required for LUE/LLE movement. Bed pad used to assist trunk into full upright position, and assist at shoulders provided to gain/maintain sitting balance.  Transfers Overall transfer  level: Needs assistance Equipment used: 2 person hand held assist Transfers: Sit to/from Stand, Stand Pivot Transfers Sit to Stand: Mod assist, +2 physical assistance Stand pivot transfers: Max assist, +2 physical assistance General transfer comment: L knee blocked during transition to stand, however noted L knee hyperextension with pivot to chair. Pt was able to hold therapist's arm with RUE and with cues to imporve posture, lifted head up to achieve more upright posture.   ADL: ADL Overall ADL's : Needs assistance/impaired Grooming: Moderate assistance, Sitting Grooming Details (indicate cue type and reason):  With max assist for sitting balance.  Upper Body Bathing: Maximal assistance, Sitting Lower Body Bathing: Total assistance, Bed level Upper Body Dressing : Maximal assistance, Sitting Lower Body Dressing: Total assistance, Bed level Toileting- Clothing Manipulation and Hygiene: Total assistance, Sit to/from stand General ADL Comments: Unsafe to attempt transfer this session due to decreased sitting balance and medical status per RN and NP. Pt able to engage in ADL tasks and is limited by lethargy and L sided weakness.   Cognition: Cognition Overall Cognitive Status: Difficult to assess Arousal/Alertness: Lethargic Orientation Level: Oriented X4 Attention: Sustained Sustained Attention: Impaired Sustained Attention Impairment: Verbal basic Memory: Impaired Memory Impairment: Decreased recall of new information, Retrieval deficit Awareness: Impaired Awareness Impairment: Emergent impairment, Anticipatory impairment Safety/Judgment: Impaired Cognition Arousal/Alertness: Lethargic, Suspect due to medications Behavior During Therapy: Flat affect Overall Cognitive Status: Difficult to assess General Comments: Following one-step commands with increased time. Oriented to person, place, month, and year. Level of arousal impacting ability to accurately assess cognition.  Difficult to assess due to: Level of arousal  Blood pressure 114/77, pulse (!) 57, temperature 98 F (36.7 C), temperature source Axillary, resp. rate 14, height 5\' 5"  (1.651 m), weight 56.3 kg (124 lb 1.9 oz), SpO2 97 %. Physical Exam  Vitals reviewed. Constitutional: She appears well-developed.  Frail  HENT:  Head: Normocephalic and atraumatic.  Eyes: Scleral icterus is present.  Pupils sluggish to light Difficulty with EOM  Neck: Normal range of motion. Neck supple. No thyromegaly present.  Cardiovascular: Normal rate and regular rhythm.   Respiratory: Effort normal.  Decreased breath sounds at the bases but  clear to auscultation  GI: Soft. Bowel sounds are normal. She exhibits no distension.  Musculoskeletal: She exhibits no edema or tenderness.  Neurological: She is alert.  She did answer basic questions as of age date of birth.  Fair awareness of deficits.  Follows simple commands Motor: RUE/RLE: 4+5 LUE/LLE: 0/5 Sensation intact to light touch  Skin: Skin is warm and dry.  Psychiatric: Her affect is blunt. Her speech is delayed. She is slowed. She exhibits abnormal remote memory.    Lab Results Last 24 Hours       Results for orders placed or performed during the hospital encounter of 12/19/16 (from the past 24 hour(s))  Basic metabolic panel     Status: Abnormal   Collection Time: 12/23/16  2:53 AM  Result Value Ref Range   Sodium 137 135 - 145 mmol/L   Potassium 5.2 (H) 3.5 - 5.1 mmol/L   Chloride 100 (L) 101 - 111 mmol/L   CO2 21 (L) 22 - 32 mmol/L   Glucose, Bld 78 65 - 99 mg/dL   BUN 71 (H) 6 - 20 mg/dL   Creatinine, Ser 12.52 (H) 0.44 - 1.00 mg/dL   Calcium 8.5 (L) 8.9 - 10.3 mg/dL   GFR calc non Af Amer 3 (L) >60 mL/min   GFR calc Af Amer 3 (L) >  60 mL/min   Anion gap 16 (H) 5 - 15  CBC     Status: Abnormal   Collection Time: 12/23/16  2:53 AM  Result Value Ref Range   WBC 7.6 4.0 - 10.5 K/uL   RBC 5.28 (H) 3.87 - 5.11 MIL/uL   Hemoglobin 17.4 (H) 12.0 - 15.0 g/dL   HCT 53.4 (H) 36.0 - 46.0 %   MCV 101.1 (H) 78.0 - 100.0 fL   MCH 33.0 26.0 - 34.0 pg   MCHC 32.6 30.0 - 36.0 g/dL   RDW 15.3 11.5 - 15.5 %   Platelets 247 150 - 400 K/uL  Triglycerides     Status: Abnormal   Collection Time: 12/23/16  2:53 AM  Result Value Ref Range   Triglycerides 438 (H) <150 mg/dL      Imaging Results (Last 48 hours)  Ct Head Wo Contrast  Result Date: 12/22/2016 CLINICAL DATA:  Patient admitted for ICH, stroke. MRI demonstrated PRES. EXAM: CT HEAD WITHOUT CONTRAST TECHNIQUE: Contiguous axial images were obtained from the base of the skull through  the vertex without intravenous contrast. COMPARISON:  Multiple priors, most recent earlier today at 3:55 a.m. FINDINGS: Brain: Redemonstrated is an intraparenchymal hemorrhage, not significantly changed in size or shape, roughly 4 x 3 x 5 cm. Mild surrounding vasogenic edema is not increased. Slight effacement RIGHT lateral ventricle. Unchanged midline shift RIGHT to LEFT, roughly 3 mm. No hydrocephalus or ventricular trapping. Effacement of the basilar cisterns redemonstrated. Vascular: No hyperdense vessel or unexpected calcification. Skull: Normal. Negative for fracture or focal lesion. Sinuses/Orbits: No acute finding. Other: None. IMPRESSION: Stable intraparenchymal hemorrhage. Subtle signs of midline shift, and slight increased pressure. But no significant worsening from priors. Electronically Signed   By: Staci Righter M.D.   On: 12/22/2016 09:47   Ct Head Wo Contrast  Result Date: 12/22/2016 CLINICAL DATA:  Follow-up examination for acute intracranial hemorrhage. EXAM: CT HEAD WITHOUT CONTRAST TECHNIQUE: Contiguous axial images were obtained from the base of the skull through the vertex without intravenous contrast. COMPARISON:  Prior CT from 12/21/2016. FINDINGS: Brain: Intraparenchymal hemorrhage centered at the high right frontal parietal region again seen. Hemorrhage overall not significantly changed measuring 4.1 x 3.3 x 5.4 cm seen greatest dimensions (previously 4.1 x 3.4 x 5.5 cm). Surrounding low-density vasogenic edema relatively with sulcal effacement relatively similar. Mass effect on the right lateral ventricle which is partially effaced relatively unchanged. Right-to-left midline shift measures approximately 3 mm, similar. No hydrocephalus or ventricular trapping. No intraventricular extension of hemorrhage. Adjacent small volume subarachnoid hemorrhage noted, similar. Adjacent extra-axial hemorrhage unchanged. No new acute intracranial hemorrhage. No evidence for acute large vessel  territory infarct. Vascular: No hyperdense vessel. Scattered vascular calcifications noted within the carotid siphons. Skull: Scalp soft tissues and calvarium unchanged, and remain within normal limits. Sinuses/Orbits: Globes and orbital soft tissues within normal limits. Paranasal sinuses and mastoid air cells remain clear. IMPRESSION: 1. No significant interval change in posterior right frontal intraparenchymal hematoma with extra-axial extension. Similar localized edema and mass effect with 3 mm right-to-left shift. 2. No other new acute intracranial process. Electronically Signed   By: Jeannine Boga M.D.   On: 12/22/2016 04:49     Assessment/Plan: Diagnosis: Right frontal hemorrhage, small SAH, and signs of PRES Labs and images independently reviewed.  Records reviewed and summated above.  1. Does the need for close, 24 hr/day medical supervision in concert with the patient's rehab needs make it unreasonable for this patient to be served in  a less intensive setting? Yes  2. Co-Morbidities requiring supervision/potential complications: Elevated troponin (recs per Cards), seizure (cont meds), HTN (monitor and provide prns in accordance with increased physical exertion and pain), ESRD with home peritoneal dialysis (recs per Nephro), right renal cancer status post nephrectomy 3. Due to safety, disease management, medication administration and patient education, does the patient require 24 hr/day rehab nursing? Yes 4. Does the patient require coordinated care of a physician, rehab nurse, PT (1-2 hrs/day, 5 days/week), OT (1-2 hrs/day, 5 days/week) and SLP (1-2 hrs/day, 5 days/week) to address physical and functional deficits in the context of the above medical diagnosis(es)? Yes Addressing deficits in the following areas: balance, endurance, locomotion, strength, transferring, bathing, dressing, toileting, cognition, speech and psychosocial support 5. Can the patient actively participate in an  intensive therapy program of at least 3 hrs of therapy per day at least 5 days per week? Potentially 6. The potential for patient to make measurable gains while on inpatient rehab is excellent 7. Anticipated functional outcomes upon discharge from inpatient rehab are min assist and mod assist  with PT, min assist and mod assist with OT, min assist with SLP. 8. Estimated rehab length of stay to reach the above functional goals is: 20-24 days. 9. Anticipated D/C setting: Other 10. Anticipated post D/C treatments: SNF 11. Overall Rehab/Functional Prognosis: good  RECOMMENDATIONS: This patient's condition is appropriate for continued rehabilitative care in the following setting: CIR when medically appropriate Patient has agreed to participate in recommended program. Yes Note that insurance prior authorization may be required for reimbursement for recommended care.  Comment: Rehab Admissions Coordinator to follow up.  Delice Lesch, MD, Mellody Drown Cathlyn Parsons., PA-C 12/23/2016    Revision History                   Routing History

## 2017-01-05 NOTE — Progress Notes (Signed)
Social Work Assessment and Plan  Patient Details  Name: Terry Juarez MRN: [id number] Date of Birth: 11/15/1965  Today's Date: 01/05/2017  Problem List:  Patient Active Problem List   Diagnosis Date Noted  . ESRD on dialysis (HCC)   . Leukocytosis   . Hyperglycemia   . Hypertensive emergency 01/04/2017  . Acute left hemiparesis (HCC) 01/04/2017  . Intracranial hemorrhage (HCC) 01/04/2017  . Subarachnoid hematoma (HCC)   . Hypothyroidism   . Benign essential HTN   . Seizure prophylaxis   . Cardiac arrest, cause unspecified (HCC)   . Acute respiratory failure with hypoxemia (HCC)   . Acute encephalopathy   . ICH (intracerebral hemorrhage) (HCC)   . Seizure (HCC)   . PRES (posterior reversible encephalopathy syndrome)   . Subarachnoid hemorrhage 12/19/2016  . Symptomatic anemia 07/29/2016  . Cough 01/09/2016  . Elevated troponin 02/12/2015  . Headache 02/12/2015  . Syncope 02/12/2015  . ESRD on peritoneal dialysis (HCC) 02/12/2015  . Hypertension   . Hyperlipidemia   . Anemia   . Essential hypertension   . Cephalalgia   . Right renal mass 07/25/2014   Past Medical History:  Past Medical History:  Diagnosis Date  . Anemia   . Bruises easily   . Dialysis patient (HCC)   . ESRD on dialysis (HCC)   . Hyperlipidemia   . Hypertension   . Renal disorder    rt renal mass / < functioning of left kidney - being prepared for possible dialysis  . Right renal mass    Past Surgical History:  Past Surgical History:  Procedure Laterality Date  . AV FISTULA PLACEMENT Right 07/11/2014   Procedure: ARTERIOVENOUS (AV) FISTULA CREATION RIGHT ARM BRACHIO-CEPHALIC WITH ATTEMPTED RADIO-CEPHALIC (AV) FISTULA;  Surgeon: Pryor Ochoa, MD;  Location: [facility];  Service: Vascular;  Laterality: Right;  . COLONOSCOPY WITH PROPOFOL N/A 09/04/2016   Procedure: COLONOSCOPY WITH PROPOFOL;  Surgeon: Jeani Hawking, MD;  Location: [facility] ENDOSCOPY;  Service: Endoscopy;  Laterality: N/A;  . ECTOPIC  PREGNANCY SURGERY  1987  . ESOPHAGOGASTRODUODENOSCOPY N/A 07/30/2016   Procedure: ESOPHAGOGASTRODUODENOSCOPY (EGD);  Surgeon: Jeani Hawking, MD;  Location: [facility] ENDOSCOPY;  Service: Endoscopy;  Laterality: N/A;  Bedside  . ESOPHAGOGASTRODUODENOSCOPY (EGD) WITH PROPOFOL N/A 09/04/2016   Procedure: ESOPHAGOGASTRODUODENOSCOPY (EGD) WITH PROPOFOL;  Surgeon: Jeani Hawking, MD;  Location: [facility] ENDOSCOPY;  Service: Endoscopy;  Laterality: N/A;  . INSERTION OF DIALYSIS CATHETER Right 07/11/2014   Procedure: INSERTION OF DIALYSIS CATHETER IN RIGHT INTERNAL JUGULAR ;  Surgeon: Pryor Ochoa, MD;  Location: [facility] OR;  Service: Vascular;  Laterality: Right;  . LAPAROSCOPIC NEPHRECTOMY Right 07/25/2014   Procedure: RIGHT LAPAROSCOPIC RADICAL NEPHRECTOMY ;  Surgeon: Crist Fat, MD;  Location: [facility] ORS;  Service: Urology;  Laterality: Right;  . PATCH ANGIOPLASTY Right 07/11/2014   Procedure: PATCH ANGIOPLASTY OF RIGHT RADIAL ARTERY USING CEPHALIC VEIN.;  Surgeon: Pryor Ochoa, MD;  Location[facility] OR;  Service: Vascular;  Laterality: Right;  . THROMBECTOMY W/ EMBOLECTOMY Right 07/11/2014   Procedure: THROMBECTOMY OF RIGHT RADIAL ARTERY  ;  Surgeon: Pryor Ochoa, MD;  Location: [facility] OR;  Service: Vascular;  Laterality: Right;   Social History:  reports that she has never smoked. She has never used smokeless tobacco. She reports that she drinks about 1.2 oz of alcohol per week . She reports that she does not use drugs.  Family / Support Systems Marital Status: Widow/Widower How Long?: 6 years ago Patient Roles: Other (Comment) (her deceased husband's family  is her main support) Other Supports: Shir Bergman - brother-in-law - [phone]; Lashala Laser - sister-in-law - [phone] Anticipated Caregiver: Herbie Baltimore and Charna Busman - brother-in-law; sister-in-law Ability/Limitations of Caregiver: 24/7 upon discharge to their home Caregiver Availability: 24/7 Family Dynamics: supportive family of her deceased husband.  Her  father in on hemodialysis and she had just taken him in PTA.  In-laws helping with him too.  He will likely need to go to a facility for care.  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   Legal History/Current Legal Issues: none reported Guardian/Conservator: Currently, pt is not fully capable of making her own decisions.   Abuse/Neglect Physical Abuse: Denies Verbal Abuse: Denies Sexual Abuse: Denies Exploitation of patient/patient's resources: Denies Self-Neglect: Denies  Emotional Status Pt's affect, behavior and adjustment status: Pt reports staying even emotionally, but she seems physically and emotionally exhausted to this CSW.  CSW will continue to assess this and will refer to Neuropsychologist when appropriate. Recent Psychosocial Issues: Pt's brother-in-law is assisting pt with her financial/psychosocial affairs - short term and long term disabilty through work, pt's home, pt's father who was living in her home. Psychiatric History: none reported Substance Abuse History: none reported  Patient / Family Perceptions, Expectations & Goals Pt/Family understanding of illness & functional limitations: Pt reports an understanding of her condition and reports she currently does not have any questions for the medical team. Premorbid pt/family roles/activities: Pt was working fulltime as a Educational psychologist at [employer] and caring for her father at her home. Anticipated changes in roles/activities/participation: Pt will not be able to do the above activities for a while. Pt/family expectations/goals: Pt wants to get her balance back.  Community [employer] Agencies: None Premorbid Home Care/DME Agencies: Other (Comment) (PD at home PTA) Transportation available at  discharge: family Resource referrals recommended: Neuropsychology, Support group (specify) (stroke support group)  Discharge Planning Living Arrangements: Non-relatives/Friends, Parent, Other relatives (PTA, pt lived with a roommate and her father in pt's home. She plans to go with her brother-in-law and sister-in-law in [place] at d/c.) Support Systems: Parent, Other relatives, Friends/neighbors, Other (Comment) (co-workers/employers) Type of Residence: Private residence [employer] Resources: Multimedia programmer (specify), Medicare ([employer] [place] from pt's work.  Medicare secondary due to ESRD.) Financial Resources: Employment Financial Screen Referred: No Living Expenses: Higher education careers adviser Management: Patient (Pt relies on brother-in-law to help with financial issues she is dealing with right now.) Home Management: Pt's family can do this. Patient/Family Preliminary Plans: Pt plans to go to live with her brother-in-law and sister-in-law in [place], [place]. Barriers to Discharge: Steps, Family Support (family is working on having a ramp built.) Social Work Anticipated Follow Up Needs: HH/OP, Support Group Expected length of stay: 20-24 days   Clinical Impression CSW met with pt to introduce self and role of CSW, as well as to complete assessment.  Pt reported being "off balance" and dizzy at times.  Pt worked hard to focus on our conversation and was able to answer CSW's questions.  She reports support from her deceased husband's mother, brother (and his wife), and sister.  Her father is ill and cannot help and mother-in-law is elderly, so brother-in-law and his wife will help pt at d/c.  Pt gave permission for CSW to talk to any of her emergency  contacts, but that brother-in-law and mother-in-law are main contacts.  CSW spoke with brother-in-law, Tedd Sias, and he confirmed the plan.  He is already making arrangements to have pt in their home and contacting dialysis resources as he is given  the information.  CSW explained that we would help to arrange [facility] therapies and order any recommended DME.  Mr. Chiong asked about family education and CSW explained that it's best to come right before pt goes home so that pt can be at the level she will likely be at when she goes home.  He expressed understanding.  Also invited him to come to any of her therapies, as he is visiting.  CSW told him that we would talk again after team conference tomorrow to update him on LOS and targeted d/c date.  He was appreciative of CSW call.  CSW will continue to follow and assist as needed.  Dusan Lipford, [name] 01/05/2017, 12:19 PM

## 2017-01-05 NOTE — Progress Notes (Signed)
*  PRELIMINARY RESULTS* Vascular Ultrasound Bilateral lower extremity venous duplex has been completed.  Preliminary findings: No evidence of deep vein thrombosis or baker's cysts bilaterally.   Everrett Coombe 01/05/2017, 3:08 PM

## 2017-01-05 NOTE — Evaluation (Signed)
Physical Therapy Assessment and Plan  Patient Details  Name: Terry Juarez MRN: [id number] Date of Birth: Feb 18, 1966  PT Diagnosis: Abnormal posture, Abnormality of gait, Difficulty walking, Hemiparesis non-dominant, Impaired cognition, Muscle weakness and Pain in joint Rehab Potential: Good ELOS: 4 weeks   Today's Date: 01/05/2017 PT Individual Time: 1030-1130, 1600-1630 PT Individual Time Calculation (min): 60 min , 30 min   Problem List:  Patient Active Problem List   Diagnosis Date Noted  . ESRD on dialysis ([place])   . Leukocytosis   . Hyperglycemia   . Hypertensive emergency 01/04/2017  . Acute left hemiparesis ([place]) 01/04/2017  . Intracranial hemorrhage ([place]) 01/04/2017  . Subarachnoid hematoma ([place])   . Hypothyroidism   . Benign essential HTN   . Seizure prophylaxis   . Cardiac arrest, cause unspecified ([place])   . Acute respiratory failure with hypoxemia ([place])   . Acute encephalopathy   . ICH (intracerebral hemorrhage) ([place])   . Seizure ([place])   . PRES (posterior reversible encephalopathy syndrome)   . Subarachnoid hemorrhage 12/19/2016  . Symptomatic anemia 07/29/2016  . Cough 01/09/2016  . Elevated troponin 02/12/2015  . Headache 02/12/2015  . Syncope 02/12/2015  . ESRD on peritoneal dialysis ([place]) 02/12/2015  . Hypertension   . Hyperlipidemia   . Anemia   . Essential hypertension   . Cephalalgia   . Right renal mass 07/25/2014    Past Medical History:  Past Medical History:  Diagnosis Date  . Anemia   . Bruises easily   . Dialysis patient ([place])   . ESRD on dialysis ([place])   . Hyperlipidemia   . Hypertension   . Renal disorder    rt renal mass / < functioning of left kidney - being prepared for possible dialysis  . Right renal mass    Past Surgical History:  Past Surgical History:  Procedure Laterality Date  . AV FISTULA PLACEMENT Right 07/11/2014   Procedure: ARTERIOVENOUS (AV) FISTULA CREATION RIGHT ARM BRACHIO-CEPHALIC WITH ATTEMPTED RADIO-CEPHALIC  (AV) FISTULA;  Surgeon: Mal Misty, MD;  Location: [place];  Service: Vascular;  Laterality: Right;  . COLONOSCOPY WITH PROPOFOL N/A 09/04/2016   Procedure: COLONOSCOPY WITH PROPOFOL;  Surgeon: Carol Ada, MD;  [facility] ENDOSCOPY;  Service: Endoscopy;  Laterality: N/A;  . ECTOPIC PREGNANCY SURGERY  1987  . ESOPHAGOGASTRODUODENOSCOPY N/A 07/30/2016   Procedure: ESOPHAGOGASTRODUODENOSCOPY (EGD);  Surgeon: Carol Ada, MD;  Location: [facility] ENDOSCOPY;  Service: Endoscopy;  Laterality: N/A;  Bedside  . ESOPHAGOGASTRODUODENOSCOPY (EGD) WITH PROPOFOL N/A 09/04/2016   Procedure: ESOPHAGOGASTRODUODENOSCOPY (EGD) WITH PROPOFOL;  Surgeon: Carol Ada, MD;  Location: [facility]OSCOPY;  Service: Endoscopy;  Laterality: N/A;  . INSERTION OF DIALYSIS CATHETER Right 07/11/2014   Procedure: INSERTION OF DIALYSIS CATHETER IN RIGHT INTERNAL JUGULAR ;  Surgeon: Mal Misty, MD;  Location: [place];  Service: Vascular;  Laterality: Right;  . LAPAROSCOPIC NEPHRECTOMY Right 07/25/2014   Procedure: RIGHT LAPAROSCOPIC RADICAL NEPHRECTOMY ;  Surgeon: Ardis Hughs, MD;  Location[facility] ORS;  Service: Urology;  Laterality: Right;  . PATCH ANGIOPLASTY Right 07/11/2014   Procedure: PATCH ANGIOPLASTY OF RIGHT RADIAL ARTERY USING CEPHALIC VEIN.;  Surgeon: Mal Misty, MD;  Location: [place];  Service: Vascular;  Laterality: Right;  . THROMBECTOMY W/ EMBOLECTOMY Right 07/11/2014   Procedure: THROMBECTOMY OF RIGHT RADIAL ARTERY  ;  Surgeon: Mal Misty, MD;  Location: [facility] OR;  Service: Vascular;  Laterality: Right;    Assessment & Plan Clinical Impression: Patient is a 51 y.o. year old  female with recent admission to the hospital on 6/16/2018with history of hypertension,history of GI bleed, ESRD with home peritoneal dialysis, right renal cancer status post nephrectomy.Per chart review and mother-in-law patient lives in [place] with a roommate. Independent prior to admission. One level home with 3 steps to entry. Question 24-hour  assistance on discharge.Presented 12/19/2016 with new onset of seizure, acute left-sided weakness and right frontal headache. UDS positive for benzos. Troponin 1.21. CT scan imaging showed a 29 mm right frontal hemorrhage with surrounding edema and mass effect, also a small SAH and signs of PRES. CTA head and neck showed no AVMs but showed the hematoma was mildly increased from the earlier imaging. Maintain on nicardipine drip as well as Keppra. MRI of the brain reviewed, showing stable hemorrhage. Latest cranial CT head 12/24/2016 reviewed, stable. EEG showed diffuse cerebral dysfunction no seizure activity.Dialysis ongoing as per renal services. Dysphagia #3 thin liquid diet. Therapy evaluations completed 12/22/2016 with recommendations of physical medicine rehabilitation consult.Patient transferred to CIR on 01/04/2017 .   Patient currently requires max with mobility secondary to muscle weakness, impaired timing and sequencing, unbalanced muscle activation, decreased coordination and decreased motor planning, decreased midline orientation and decreased attention to left and decreased sitting balance, decreased standing balance, decreased postural control, hemiplegia and decreased balance strategies.  Prior to hospitalization, patient was independent  with mobility and lived with Other (Comment) (Roomate) in a House home.  Home access is 3Stairs to enter.  Patient will benefit from skilled PT intervention to maximize safe functional mobility, minimize fall risk and decrease caregiver burden for planned discharge home with 24 hour supervision.  Anticipate patient will benefit from follow up [place] at discharge.  PT - End of Session Activity Tolerance: Decreased this session;Tolerates 30+ min activity with multiple rests Endurance Deficit: Yes PT Assessment Rehab Potential (ACUTE/IP ONLY): Good Barriers to Discharge: Inaccessible home environment;Decreased caregiver support PT Patient demonstrates impairments  in the following area(s): Balance;Endurance;Motor;Pain;Perception;Safety PT Transfers Functional Problem(s): Bed Mobility;Bed to Chair;Car;Furniture;Floor PT Locomotion Functional Problem(s): Ambulation;Wheelchair Mobility;Stairs PT Plan PT Intensity: Minimum of 1-2 x/day ,45 to 90 minutes PT Frequency: 5 out of 7 days PT Duration Estimated Length of Stay: 4 weeks PT Treatment/Interventions: Ambulation/gait training;Balance/vestibular training;Community reintegration;DME/adaptive equipment instruction;Functional electrical stimulation;Functional mobility training;Patient/family education;Pain management;Neuromuscular re-education;Stair training;UE/LE Strength taining/ROM;Wheelchair propulsion/positioning;Therapeutic Activities;Therapeutic Exercise;Splinting/orthotics;Visual/perceptual remediation/compensation;UE/LE Coordination activities;Discharge planning;Disease management/prevention PT Transfers Anticipated Outcome(s): Min assist PT Locomotion Anticipated Outcome(s): Mod assist PT Recommendation Follow Up Recommendations: Home health PT;24 hour supervision/assistance Patient destination: Home Equipment Recommended: To be determined    Skilled Therapeutic Intervention Session 1: 1030-1130 Session focused on functional mobility, maintaining upright posture and balance. Pt sitting edge of mat worked on maintaining balance and midline with facilitation to trunk for extension and facilitation for weightshift onto L hip. Pt ambulated x 8 ft with Max assist plus 2 to block and advance L LE, and for trunk control as pt loses balances towards L side and HHA. Pt ambulated x 3 ft using R handrail, max assist +2 in order to maintain balance, trunk control, facilitate lateral weight shift and block and advance L LE. During ambulation, pts R knee would also buckle at times intermittently. Worked on static standing balance and posture in order to correct lateral trunk lean, facilitation for trunk extension  and weightshift over L LE. Pt left in w/c with safety belt on and call bell in reach. Pt denies pain this session, but reports that her L shoulder often hurts when she sleeps.   Session 2: 5366-4403  Pt supine in bed upon PT arrival, agreeable to therapy tx. Pt transferred from supine to sitting with moderate assist in order to help lift LE's and to help control trunk, verbal cues for pt to push up using R UE from sidelying to sit. Sitting EOB, pt transferred to her R squat pivot transfer with mod assist, verbal cues to push through R LE and to reach for w/c with R UE. Session focused on standing tolerance, postural control in standing and weightshifting. Standing x 3 min, x 5 min and x 2 min with handrail and Max A, mirror was used to help pt find midline, therapist provided facilitation at the trunk for weightshift over L side, facilitation to L glutes for activation and blocked L LE to prevent knee buckling. In standing, verbal cues for trunk extension, to find midline, and to shift weight onto L LE. Pt tolerated session well, assisted back to bed squat pivot transfer with Mod A. Pt left with needs met and call bell in reach.   PT Evaluation Precautions/Restrictions Precautions Precautions: Fall Precaution Comments: Peritoneal dialysis patient; L subluxed shoulder Restrictions Weight Bearing Restrictions: No General   Vital Signs  Pain Pain Assessment Pain Assessment: No/denies pain Home Living/Prior Functioning Home Living Available Help at Discharge: Family;Friend(s) (Will go home with brother in law/sister in law) Type of Home: House Home Access: Stairs to enter [employer] of Steps: 3 Entrance Stairs-Rails:  (Pt states there is a rail but unclear which side ) Home Layout: One level  Lives With: Other (Comment) (Roomate) Prior Function Level of Independence: Independent with basic ADLs;Independent with gait  Able to Take Stairs?: Yes Driving: Yes Vocation: Full time  employment Comments: per patient Vision/Perception  Vision - Assessment Additional Comments: pt reports she had "blurry/double vision" but it has since improved  Cognition Overall Cognitive Status: Impaired/Different from baseline Arousal/Alertness: Lethargic Orientation Level: Oriented X4 Attention: Sustained Sustained Attention: Impaired Sustained Attention Impairment: Verbal basic Awareness: Impaired Awareness Impairment: Emergent impairment;Anticipatory impairment Safety/Judgment: Impaired Sensation Sensation Light Touch: Appears Intact Stereognosis: Appears Intact Hot/Cold: Appears Intact Proprioception: Appears Intact Coordination Gross Motor Movements are Fluid and Coordinated: No (WFL on R, Impaired on L side due to hemiparesis) Fine Motor Movements are Fluid and Coordinated: No (WFL on R, Impaired on L side due to hemiparesis) Heel Shin Test: WFL on R, unable to test on L Motor  Motor Motor:  (L Hemiparesis)  Trunk/Postural Assessment  Cervical Assessment Cervical Assessment: Exceptions to [facility] (Increased forward head posture at rest) Thoracic Assessment Thoracic Assessment: Within Functional Limits Lumbar Assessment Lumbar Assessment: Within Functional Limits Postural Control Postural Control: Deficits on evaluation Trunk Control: decreased trunk control with increased lateral lean  Postural Limitations: decreased midline posture  Balance Balance Balance Assessed: Yes Static Sitting Balance Static Sitting - Level of Assistance: 4: Min assist Dynamic Sitting Balance Dynamic Sitting - Level of Assistance: 3: Mod assist Static Standing Balance Static Standing - Level of Assistance: 2: Max assist Dynamic Standing Balance Dynamic Standing - Level of Assistance: 2: Max assist Extremity Assessment  RLE Assessment RLE Assessment: Exceptions to [facility] (Pt demonstrates strength against gravity, decreased knee muscular control in standing as evidenced by occasional  buckling ) LLE Assessment LLE Assessment: Exceptions to WFL (DF PROM to neutral. Pt presents with trace muscle activation in quads and hip flexors. )   See Function Navigator for Current Functional Status.   Refer to Care Plan for Long Term Goals  Recommendations for other services: None   Discharge Criteria:  Patient will be discharged from PT if patient refuses treatment 3 consecutive times without medical reason, if treatment goals not met, if there is a change in medical status, if patient makes no progress towards goals or if patient is discharged from hospital.  The above assessment, treatment plan, treatment alternatives and goals were discussed and mutually agreed upon: by patient  Netta Corrigan, PT, DPT 01/05/2017, 12:09 PM

## 2017-01-05 NOTE — Evaluation (Addendum)
Speech Language Pathology Assessment and Plan  Patient Details  Name: Terry Juarez MRN: [id number] Date of Birth: Aug 10, 1965  SLP Diagnosis: Cognitive Impairments  Rehab Potential: Excellent ELOS: 25-28 days     Today's Date: 01/05/2017 SLP Individual Time: 2836-6294 SLP Individual Time Calculation (min): 55 min   Problem List:  Patient Active Problem List   Diagnosis Date Noted  . ESRD on dialysis ([place])   . Leukocytosis   . Hyperglycemia   . Hypertensive emergency 01/04/2017  . Acute left hemiparesis ([place]) 01/04/2017  . Intracranial hemorrhage ([place]) 01/04/2017  . Subarachnoid hematoma ([place])   . Hypothyroidism   . Benign essential HTN   . Seizure prophylaxis   . Cardiac arrest, cause unspecified ([place])   . Acute respiratory failure with hypoxemia ([place])   . Acute encephalopathy   . ICH (intracerebral hemorrhage) ([place])   . Seizure ([place])   . PRES (posterior reversible encephalopathy syndrome)   . Subarachnoid hemorrhage 12/19/2016  . Symptomatic anemia 07/29/2016  . Cough 01/09/2016  . Elevated troponin 02/12/2015  . Headache 02/12/2015  . Syncope 02/12/2015  . ESRD on peritoneal dialysis ([place]) 02/12/2015  . Hypertension   . Hyperlipidemia   . Anemia   . Essential hypertension   . Cephalalgia   . Right renal mass 07/25/2014   Past Medical History:  Past Medical History:  Diagnosis Date  . Anemia   . Bruises easily   . Dialysis patient ([place])   . ESRD on dialysis ([place])   . Hyperlipidemia   . Hypertension   . Renal disorder    rt renal mass / < functioning of left kidney - being prepared for possible dialysis  . Right renal mass    Past Surgical History:  Past Surgical History:  Procedure Laterality Date  . AV FISTULA PLACEMENT Right 07/11/2014   Procedure: ARTERIOVENOUS (AV) FISTULA CREATION RIGHT ARM BRACHIO-CEPHALIC WITH ATTEMPTED RADIO-CEPHALIC (AV) FISTULA;  Surgeon: Mal Misty, MD;  Location: [facility];  Service: Vascular;  Laterality: Right;  .  COLONOSCOPY WITH PROPOFOL N/A 09/04/2016   Procedure: COLONOSCOPY WITH PROPOFOL;  Surgeon: Carol Ada, MD;  Location: [facility];  Service: Endoscopy;  Laterality: N/A;  . ECTOPIC PREGNANCY SURGERY  1987  . ESOPHAGOGASTRODUODENOSCOPY N/A 07/30/2016   Procedure: ESOPHAGOGASTRODUODENOSCOPY (EGD);  Surgeon: Carol Ada, MD;  Location: [facility] ENDOSCOPY;  Service: Endoscopy;  Laterality: N/A;  Bedside  . ESOPHAGOGASTRODUODENOSCOPY (EGD) WITH PROPOFOL N/A 09/04/2016   Procedure: ESOPHAGOGASTRODUODENOSCOPY (EGD) WITH PROPOFOL;  Surgeon: Carol Ada, MD;  Location: [facility];  Service: Endoscopy;  Laterality: N/A;  . INSERTION OF DIALYSIS CATHETER Right 07/11/2014   Procedure: INSERTION OF DIALYSIS CATHETER IN RIGHT INTERNAL JUGULAR ;  Surgeon: Mal Misty, MD;  Location: [facility];  Service: Vascular;  Laterality: Right;  . LAPAROSCOPIC NEPHRECTOMY Right 07/25/2014   Procedure: RIGHT LAPAROSCOPIC RADICAL NEPHRECTOMY ;  Surgeon: Ardis Hughs, MD;  Location: [facility];  Service: Urology;  Laterality: Right;  . PATCH ANGIOPLASTY Right 07/11/2014   Procedure: PATCH ANGIOPLASTY OF RIGHT RADIAL ARTERY USING CEPHALIC VEIN.;  Surgeon: Mal Misty, MD;  Location: [facility];  Service: Vascular;  Laterality: Right;  . THROMBECTOMY W/ EMBOLECTOMY Right 07/11/2014   Procedure: THROMBECTOMY OF RIGHT RADIAL ARTERY  ;  Surgeon: Mal Misty, MD;  Location: [facility];  Service: Vascular;  Laterality: Right;    Assessment / Plan / Recommendation Clinical Impression Patient is a 51 y.o. right handed female with history of hypertension,history of GI bleed, ESRD with home peritoneal dialysis, right  renal cancer status post nephrectomy. Per chart review and mother-in-law patient lives in [place] with a roommate. Independent prior to admission. One level home with 3 steps to entry. Question 24-hour assistance on discharge. Presented 12/19/2016 with new onset of seizure, acute left-sided weakness and right frontal headache. UDS  positive for benzos. Troponin 1.21. CT scan imaging showed a 29 mm right frontal hemorrhage with surrounding edema and mass effect, also a small SAH and signs of PRES. CTA head and neck showed no AVMs but showed the hematoma was mildly increased from the earlier imaging. Maintain on nicardipine drip as well as Keppra. MRI of the brain reviewed, showing stable hemorrhage. Latest cranial CT head 12/24/2016 reviewed, stable. EEG showed diffuse cerebral dysfunction no seizure activity.Dialysis ongoing as per renal services. Therapy evaluations completed 12/22/2016 with recommendations of physical medicine rehabilitation consult.Patient was admitted for a comprehensive rehabilitation program 01/04/17.  Patient demonstrates moderate cognitive deficits impacting sustained attention, functional problem solving, attention to left field of environment, recall and awareness which impacts his ability to complete functional and familiar tasks safely. Patient is currently consuming thin liquids without overt s/s of aspiration and regular textures with minimal oral residue that patient independently clears. Recommend patient continue current diet. Patient would benefit from skilled SLP intervention to maximize his cognitive function and overall functional independence.    Skilled Therapeutic Interventions          Administered a BSE and cognitive-linguistic evaluation. Please see above for details.   SLP Assessment  Patient will need skilled Speech Lanaguage Pathology Services during CIR admission    Recommendations  SLP Diet Recommendations: Age appropriate regular solids;Thin Liquid Administration via: Cup;Straw Medication Administration: Whole meds with liquid Supervision: Patient able to self feed;Intermittent supervision to cue for compensatory strategies Compensations: Slow rate;Small sips/bites Postural Changes and/or Swallow Maneuvers: Seated upright 90 degrees Oral Care Recommendations: Oral care  BID Recommendations for Other Services: Neuropsych consult Patient destination: Home Follow up Recommendations: Home Health SLP;Outpatient SLP;24 hour supervision/assistance Equipment Recommended: None recommended by SLP    SLP Frequency 3 to 5 out of 7 days   SLP Duration  SLP Intensity  SLP Treatment/Interventions 25-28 days   Minumum of 1-2 x/day, 30 to 90 minutes  Cognitive remediation/compensation;Cueing hierarchy;Functional tasks;Patient/family education;Therapeutic Activities;Internal/external aids;Environmental controls    Pain Pain Assessment Pain Assessment: No/denies pain  Prior Functioning Type of Home: House  Lives With: Other (Comment) (Roommate) Available Help at Discharge: Family;Friend(s) (Will go home with brother in law/sister in [place]) Vocation: Full time employment  Function:  Eating Eating   Modified Consistency Diet: No Eating Assist Level: Swallowing techniques: self managed;Set up assist for   Eating Set Up Assist For: Opening containers;Cutting food       Cognition Comprehension Comprehension assist level: Follows complex conversation/direction with extra time/assistive device  Expression   Expression assist level: Expresses complex ideas: With extra time/assistive device  Social Interaction Social Interaction assist level: Interacts appropriately with others with medication or extra time (anti-anxiety, antidepressant).  Problem Solving Problem solving assist level: Solves basic 90% of the time/requires cueing < 10% of the time  Memory Memory assist level: Recognizes or recalls 90% of the time/requires cueing < 10% of the time   Short Term Goals: Week 1: SLP Short Term Goal 1 (Week 1): Patient will demonstrate functional problem solving for basic and familiar tasks with Min A verbal cues.  SLP Short Term Goal 2 (Week 1): Patient will recall new, daily information with Min A verbal cues and use  of external aids.  SLP Short Term Goal 3 (Week 1):  Patient will self-monitor and correct errors during functional tasks with Min A verbal cues.  SLP Short Term Goal 4 (Week 1): Patient will demonstrate selective attention in a mildly distracting enviornment to functional tasks for 30 minutes with Min A verbal cues for redirection.  SLP Short Term Goal 5 (Week 1): Patient will attend to left field of enviornment during functional tasks with Min A verbal cues.   Refer to Care Plan for Long Term Goals  Recommendations for other services: Neuropsych  Discharge Criteria: Patient will be discharged from SLP if patient refuses treatment 3 consecutive times without medical reason, if treatment goals not met, if there is a change in medical status, if patient makes no progress towards goals or if patient is discharged from hospital.  The above assessment, treatment plan, treatment alternatives and goals were discussed and mutually agreed upon: by patient  Quintez Maselli 01/05/2017, 3:21 PM

## 2017-01-05 NOTE — Progress Notes (Signed)
Orthopedic Tech Progress Note Patient Details:  Terry Juarez 1966-05-21 [id number] Brace completed by bio-tech Patient ID: [name], female   DOB: 11/29/1965, 51 y.o.   MRN: [id number]   Braulio Bosch 01/05/2017, 3:14 PM

## 2017-01-05 NOTE — Progress Notes (Signed)
Preadmission assessment reviewed and changes made  [facility] NOTE  Subjective/Complaints:  Pt seen laying ni bed this AM.  She slept well overnight.  She is ready to begin therapeis.   ROS: Denies CP, SOB, N/V/D.  Objective: Vital Signs: Blood pressure 133/87, pulse 74, temperature 98.2 F (36.8 C), temperature source Oral, resp. rate 18, height 5\' 5"  (1.651 m), weight 53.3 kg (117 lb 8.1 oz), SpO2 100 %. No results found.  Recent Labs  01/04/17 2148  WBC 14.3*  HGB 17.2*  HCT 54.3*  PLT 313    Recent Labs  01/04/17 0238 01/04/17 2148  NA 136 135  K 3.7 4.1  CL 96* 94*  GLUCOSE 112* 117*  BUN 55* 55*  CREATININE 12.59* 12.13*  CALCIUM 10.5* 10.9*   CBG (last 3)  No results for input(s): GLUCAP in the last 72 hours.  Wt Readings from Last 3 Encounters:  01/05/17 53.3 kg (117 lb 8.1 oz)  01/04/17 50.7 kg (111 lb 12.4 oz)  09/04/16 54.4 kg (120 lb)    Physical Exam:  BP 133/87 (BP Location: Left Arm)   Pulse 74   Temp 98.2 F (36.8 C) (Oral)   Resp 18   Ht 5\' 5"  (1.651 m)   Wt 53.3 kg (117 lb 8.1 oz)   SpO2 100%   BMI 19.55 kg/m  Constitutional: She appears well-developed. Frail  HENT: Normocephalic and atraumatic.  Eyes: EOMI. No discharge.  Cardiovascular: Normal rate, regular rhythm and normal heart sounds.  No JVD. Respiratory: Effort normal and breath sounds normal.  GI: Soft. Bowel sounds are normal.   Musculoskeletal: She exhibits no edema or tenderness.  Neurological: She is alert.  Fair awareness of deficits.  Motor: RUE/RLE: 5/5 LUE/LLE: 0/5 Skin: Skin is warm and dry.  Psychiatric: Her behavior is normal. Her affect is blunt.    Assessment/Plan: 1. Functional deficits secondary to right frontal hemorrhage, small SAH and signs of PRES which require 3+ hours per day of interdisciplinary therapy in a comprehensive inpatient rehab setting. Physiatrist is providing close team supervision and  24 hour management of active medical problems listed below. Physiatrist and rehab team continue to assess barriers to discharge/monitor patient progress toward functional and medical goals.  Function:  Bathing Bathing position      Bathing parts      Bathing assist        Upper Body Dressing/Undressing Upper body dressing                    Upper body assist        Lower Body Dressing/Undressing Lower body dressing                                  Lower body assist        Toileting Toileting Toileting activity did not occur: No continent bowel/bladder event        Toileting assist     Transfers Chair/bed Clinical biochemist          Cognition Comprehension Comprehension assist level: Understands basic 90% of the time/cues < 10% of the time  Expression Expression assist level: Expresses basic 90% of the time/requires cueing < 10% of the time.  Social Interaction Social  Interaction assist level: Interacts appropriately 90% of the time - Needs monitoring or encouragement for participation or interaction.  Problem Solving Problem solving assist level: Solves basic 90% of the time/requires cueing < 10% of the time  Memory Memory assist level: Recognizes or recalls 90% of the time/requires cueing < 10% of the time    Medical Problem List and Plan: 1.  Decreased functional mobility secondary to right frontal hemorrhage, small SAH and signs of PRES  Begin CIR  PRAFO/WHO ordered  Fluoxetine started 2.  DVT Prophylaxis/Anticoagulation: SCDs.   Vascular study pending 3. Pain Management: Tylenol as needed 4. Mood: Provide emotional support 5. Neuropsych: This patient is not fully capable of making decisions on her own behalf. 6. Skin/Wound Care: Routine skin checks 7. Fluids/Electrolytes/Nutrition: Routine I&Os 8. Seizure prophylaxis. Keppra 500 mg every 12 hours 9. ESRD with history of right  renal cancer status post nephrectomy. Continue peritoneal dialysis as per renal services 10. Hypertension.  Norvasc 5 mg twice daily, Coreg 12.5 mg twice a day, Cozaar 50 mg daily  Monitor with increased mobility 11. Hypothyroidism. Synthroid 12.Constipation. Laxative assistance 13. Hyperglycemia  Likely stress induced 14. Leukocytosis  WBCs 14.3 on 7/2  Cont to monitor  UA/Ucx ordered   LOS (Days) 1 A FACE TO FACE EVALUATION WAS PERFORMED  Ankit [name] 01/05/2017 7:28 AM

## 2017-01-05 NOTE — IPOC Note (Signed)
Overall Plan of Care [facility]) Patient Details Name: Terry Juarez MRN: [id number] DOB: 08-16-1965  Admitting Diagnosis: Rt Frontal Hemorr hage  Hospital Problems: Active Problems:   Intracranial hemorrhage ([place])   ESRD on dialysis ([place])   Leukocytosis   Hyperglycemia     Functional Problem List: Nursing Bowel, Endurance, Medication Management, Nutrition  PT Balance, Endurance, Motor, Pain, Perception, Safety  OT Balance, Cognition, Endurance, Motor, Perception, Safety, Vision  SLP Cognition  TR         Basic ADL's: OT Bathing, Dressing, Grooming, Toileting     Advanced  ADL's: OT       Transfers: PT Bed Mobility, Bed to Chair, Car, [employer], Floor  OT Toilet, Metallurgist: PT Ambulation, Emergency planning/management officer, Stairs     Additional Impairments: OT Fuctional Use of Upper Extremity  SLP Social Cognition   Problem Solving, Attention, Social Interaction, Awareness, Memory  TR      Anticipated Outcomes Item Anticipated Outcome  Self Feeding Independent  Swallowing      Basic self-care  supervision  Toileting  supervision   Bathroom Transfers supervision  Bowel/Bladder   (pt will have regular continent bowel movements, continent bl)  Transfers  Min assist  Locomotion  Mod assist  Communication     Cognition  Supervision   Pain   (less than 2)  Safety/Judgment   (pt will remain fall free with min assist)   Therapy Plan: PT Intensity: Minimum of 1-2 x/day ,45 to 90 minutes PT Frequency: 5 out of 7 days PT Duration Estimated Length of Stay: 4 weeks OT Intensity: Minimum of 1-2 x/day, 45 to 90 minutes OT Frequency: 5 out of 7 days OT Duration/Estimated Length of Stay: 25-28 days SLP Intensity: Minumum of 1-2 x/day, 30 to 90 minutes SLP Frequency: 3 to 5 out of 7 days SLP Duration/Estimated Length of Stay: 25-28 days        Team Interventions: Nursing Interventions Patient/Family Education, Bowel Management, Disease  Management/Prevention, Medication Management, Discharge Planning  PT interventions Ambulation/gait training, Balance/vestibular training, Community reintegration, DME/adaptive equipment instruction, Functional electrical stimulation, Functional mobility training, Patient/family education, Pain management, Neuromuscular re-education, Stair training, UE/LE Strength taining/ROM, Wheelchair propulsion/positioning, Therapeutic Activities, Therapeutic Exercise, Splinting/orthotics, Visual/perceptual remediation/compensation, UE/LE Coordination activities, Discharge planning, Disease management/prevention  OT Interventions Balance/vestibular training, Cognitive remediation/compensation, Discharge planning, DME/adaptive equipment instruction, Functional electrical stimulation, Functional mobility training, Neuromuscular re-education, Patient/family education, Self Care/advanced ADL retraining, Therapeutic Activities, Therapeutic Exercise, UE/LE Strength taining/ROM, UE/LE Coordination activities, Visual/perceptual remediation/compensation  SLP Interventions Cognitive remediation/compensation, Cueing hierarchy, Functional tasks, Patient/family education, Therapeutic Activities, Internal/external aids, Environmental controls  TR Interventions    SW/CM Interventions Discharge Planning, Psychosocial Support, Patient/Family Education    Team Discharge Planning: Destination: PT-Home ,OT- Home , SLP-Home Projected Follow-up: PT-Home health PT, 24 hour supervision/assistance, OT-  Home health OT, SLP-Home Health SLP, Outpatient SLP, 24 hour supervision/assistance Projected Equipment Needs: PT-To be determined, OT- To be determined, SLP-None recommended by SLP Equipment Details: PT- , OT-  Patient/family involved in discharge planning: PT- Patient,  OT-Patient, SLP-Patient  MD ELOS: 20-25 days. Medical Rehab Prognosis:  Good Assessment: 51 y.o. right handed female with history of hypertension,history of GI bleed,  ESRD with home peritoneal dialysis, right renal cancer status post nephrectomy. Presented 12/19/2016 with new onset of seizure, acute left-sided weakness and right frontal headache. UDS positive for benzos. Troponin 1.21. CT scan imaging showed a 29 mm right frontal hemorrhage with surrounding edema and mass effect, also a small SAH and signs  of PRES. CTA head and neck showed no AVMs but showed the hematoma was mildly increased from the earlier imaging. Maintain on nicardipine drip as well as Keppra. MRI of the brain showing stable hemorrhage. Latest cranial CT head 12/24/2016 reviewed, stable. EEG showed diffuse cerebral dysfunction no seizure activity.Dialysis ongoing as per renal services. Dysphagia #3 thin liquid diet. Pt with resulting functional deficits with mobility, transfers, safety, self-care.  Will set goals for Supervision with PT/OT/SLP.   See Team Conference Notes for weekly updates to the plan of care

## 2017-01-05 NOTE — Progress Notes (Signed)
Patient information reviewed and entered into eRehab system by Anayah Arvanitis, RN, CRRN, PPS Coordinator.  Information including medical coding and functional independence measure will be reviewed and updated through discharge.     Per nursing patient was given "Data Collection Information Summary for Patients in Inpatient Rehabilitation Facilities with attached "Privacy Act Statement-Health Care Records" upon admission.  

## 2017-01-05 NOTE — Progress Notes (Signed)
Orthopedic Tech Progress Note Patient Details:  Terry Juarez 12-16-1965 [id number]  Patient ID: [name], female   DOB: [birth year], 51 y.o.   MRN: [id number]   Hildred Priest 01/05/2017, 10:04 AM Called in bio-tech brace order; spoke with Bella Kennedy

## 2017-01-06 ENCOUNTER — Inpatient Hospital Stay (HOSPITAL_COMMUNITY): Payer: BLUE CROSS/BLUE SHIELD | Admitting: Occupational Therapy

## 2017-01-06 ENCOUNTER — Inpatient Hospital Stay (HOSPITAL_COMMUNITY): Payer: BLUE CROSS/BLUE SHIELD | Admitting: Speech Pathology

## 2017-01-06 ENCOUNTER — Inpatient Hospital Stay (HOSPITAL_COMMUNITY): Payer: BLUE CROSS/BLUE SHIELD | Admitting: Physical Therapy

## 2017-01-06 MED ORDER — AMLODIPINE BESYLATE 5 MG PO TABS
5.0000 mg | ORAL_TABLET | Freq: Every day | ORAL | Status: DC
  Administered 2017-01-07: 5 mg via ORAL
  Filled 2017-01-06: qty 1

## 2017-01-06 NOTE — Progress Notes (Signed)
[facility] Individual Statement of Services  Patient Name:  Terry Juarez  Date:  01/06/2017  Welcome to the [facility].  Our goal is to provide you with an individualized program based on your diagnosis and situation, designed to meet your specific needs.  With this comprehensive rehabilitation program, you will be expected to participate in at least 3 hours of rehabilitation therapies Monday-Friday, with modified therapy programming on the weekends.  Your rehabilitation program will include the following services:  Physical Therapy (PT), Occupational Therapy (OT), Speech Therapy (ST), 24 hour per day rehabilitation nursing, Neuropsychology, Case Management (Social Worker), Rehabilitation Medicine, Nutrition Services and Pharmacy Services  Weekly team conferences will be held on Wednesdays to discuss your progress.  Your Social Worker will talk with you frequently to get your input and to update you on team discussions.  Team conferences with you and your family in attendance may also be held.  Expected length of stay:  25 to 28 days  Overall anticipated outcome:  Supervision from the wheelchair with minimal assistance needed for car transfers and ambulation  Depending on your progress and recovery, your program may change. Your Social Worker will coordinate services and will keep you informed of any changes. Your Social Worker's name and contact numbers are listed  below.  The following services may also be recommended but are not provided by the [facility] will be made to provide these services after discharge if needed.  Arrangements include referral to agencies that provide these services.  Your insurance has been verified to be:  [employer] Your primary doctor  is:  Dr. Domenick Gong  Pertinent information will be shared with your doctor and your insurance company.  Social Worker:  Alfonse Alpers, LCSW  [phone] or ([phone]  Information discussed with and copy given to patient by: Trey Sailors, 01/06/2017, 10:19 AM

## 2017-01-06 NOTE — Patient Care Conference (Signed)
Inpatient RehabilitationTeam Conference and Plan of Care Update Date: 01/07/2017   Time: 11:20 AM    Patient Name: Terry Juarez      Medical Record Number: [id number]  Date of Birth: Mar 28, 1966 Sex: Female         Room/Bed: 4M01C/4M01C-01 Payor Info: Payor: BLUE CROSS BLUE SHIELD / Plan: BCBS OTHER / Product Type: *No Product type* /    Admitting Diagnosis: Rt Frontal Hemorr hage  Admit Date/Time:  01/04/2017  6:41 PM Admission Comments: No comment available   Primary Diagnosis:  <principal problem not specified> Principal Problem: <principal problem not specified>  Patient Active Problem List   Diagnosis Date Noted  . ESRD on dialysis ([place])   . Leukocytosis   . Hyperglycemia   . Hypertensive emergency 01/04/2017  . Acute left hemiparesis ([place]) 01/04/2017  . Intracranial hemorrhage ([place]) 01/04/2017  . Subarachnoid hematoma ([place])   . Hypothyroidism   . Benign essential HTN   . Seizure prophylaxis   . Cardiac arrest, cause unspecified ([place])   . Acute respiratory failure with hypoxemia ([place])   . Acute encephalopathy   . ICH (intracerebral hemorrhage) ([place])   . Seizure ([place])   . PRES (posterior reversible encephalopathy syndrome)   . Subarachnoid hemorrhage 12/19/2016  . Symptomatic anemia 07/29/2016  . Cough 01/09/2016  . Elevated troponin 02/12/2015  . Headache 02/12/2015  . Syncope 02/12/2015  . ESRD on peritoneal dialysis ([place]) 02/12/2015  . Hypertension   . Hyperlipidemia   . Anemia   . Essential hypertension   . Cephalalgia   . Right renal mass 07/25/2014    Expected Discharge Date: Expected Discharge Date: 02/02/17  Team Members Present: Physician leading conference: Dr. Delice Lesch Social Worker Present: Alfonse Alpers, LCSW Nurse Present: Rayetta Humphrey, RN PT Present: Barrie Folk, PT OT Present: Willeen Cass, OT SLP Present: Weston Anna, SLP PPS Coordinator present : Daiva Nakayama, RN, CRRN     Current Status/Progress Goal Weekly Team Focus   Medical   Decreased functional mobility secondary to right frontal hemorrhage, small SAH and signs of PRES  Improve mobility, BP, hyperglycemia  See above   Bowel/Bladder   continent of bowel & bladder, LBM 01/03/17, oliguric, peritoneal dialysis pt  continue continence  continue to monitor   Swallow/Nutrition/ Hydration             ADL's   max A overall with self care and transfers, impaired postural control with L lean  Supervision overall  ADL training, postural control, LUE NMR, visual motor exercises   Mobility   Mod assist for bed mobility, Max assist with transfers, +2 for ambulation   supervision bed mobility, w/c, & transfers. Min A for ambulation   postural control, standing balance, activity tolerance, LE strengthening    Communication             Safety/Cognition/ Behavioral Observations  Min A  Supervision  problem solving, recall, awareness    Pain   No c/o of pain  To keep pain less than 3.  To assess for pain Q 2-3 hrs. and PRN   Skin   Skin dry and intact.Peritoneal catheter RLQ.  To keep skin intact.  To assess skin Q shift.    Rehab Goals Patient on target to meet rehab goals: Yes Rehab Goals Revised: none - pt's first conference *See Care Plan and progress notes for long and short-term goals.  Barriers to Discharge: Mobility, transfers, hemiparesis, hyperglycemia, HTN, leukocytosis, cognition    Possible Resolutions to Barriers:  Therapies, optimize BP Meds, Ua/Ucx ordered    Discharge Planning/Teaching Needs:  Pt to d/c to her brother-in-law and sister-in-law's home in [place], [place].  Family to come for family training closer to d/c.  They will also need to go through PD training closer to home.   Team Discussion:  Pt with hyperglycemia and elevated WBC.  Dr. Posey Pronto has ordered a urine culture and nursing is awaiting pt void.  He is also managing and adjusting BP meds.  Pt is flat with left inattention and lack of awareness, but works hard and cognition is  pretty good.  With PT, pt is max with transfers, better to the right mod to max.  Walked with +2 max A 3'.  Pt's goals are supervision w/c level and min A for gait and car transfers.  Pt worked hard with OT, too.  Has difficulty with leaning and postural control.  Overall goals are supervision, but pt is max A now for self care, vision impacts this too.  PT recommends ramp.  Revisions to Treatment Plan:  None - pt's first conference   Continued Need for Acute Rehabilitation Level of Care: The patient requires daily medical management by a physician with specialized training in physical medicine and rehabilitation for the following conditions: Daily direction of a multidisciplinary physical rehabilitation program to ensure safe treatment while eliciting the highest outcome that is of practical value to the patient.: Yes Daily medical management of patient stability for increased activity during participation in an intensive rehabilitation regime.: Yes Daily analysis of laboratory values and/or radiology reports with any subsequent need for medication adjustment of medical intervention for : Neurological problems;Blood pressure problems;Other  [name], [name] 01/07/2017, 8:09 AM

## 2017-01-06 NOTE — Progress Notes (Signed)
Occupational Therapy Session Note  Patient Details  Name: Terry Juarez MRN: [id number] Date of Birth: May 04, 1966  Today's Date: 01/06/2017 OT Individual Time: 7867-5449 OT Individual Time Calculation (min): 62 min    Short Term Goals: Week 1:  OT Short Term Goal 1 (Week 1): Pt will be able to don a shirt with min A. OT Short Term Goal 2 (Week 1): Pt will demonstrate improved sitting balance by sitting statically on toilet with close S. OT Short Term Goal 3 (Week 1): Pt will complete squat pivot transfers on/off the toilet with min -mod A. OT Short Term Goal 4 (Week 1): Pt will demonstrate proficiency with self ROM of LUE with min cues or less. OT Short Term Goal 5 (Week 1): Pt will demonstrate improved LUE body awareness by using R hand to position L arm prior to transfers.  Skilled Therapeutic Interventions/Progress Updates:    Pt presented supine in bed agreeable to OT treatment session. Focus of session on ADL retraining. Pt demonstrates supine to sitting EOB with ModA, stand pivot transfer EOB to w/c with MaxA. Pt completing bathing from w/c level at sink, ModA for UB/LB bathing including steadying assist during seated dynamic activity while Pt washes LEs, therapist assisted to wash back and R UE. Educated Pt on hemi techniques for completing UB/LB dressing with Pt requiring overall ModA for UB dressing and MaxA for LB dressing including assist to thread LEs, Pt is able to pull pants/underwear over R side of hips, needs assist for L. Pt completed multiple sit<>stand at sink from w/c with Mod-MaxA for transfer completion and ModA to steady during standing for ADL completion. Educated Pt on hemi technique for donning socks with Pt able to complete using figure 4 technique and with MInA. Total assist for donning shoes. Pt completed grooming ADLs at w/c level with setup assist. Pt left seated in w/c, L hand splint donned, lap tray placed on w/c, call bell and needs within reach.   Therapy  Documentation Precautions:  Precautions Precautions: Fall Precaution Comments: Peritoneal dialysis patient; L subluxed shoulder Restrictions Weight Bearing Restrictions: No   Pain: Pain Assessment Pain Assessment: No/denies pain ADL: ADL ADL Comments: refer to functional navigator  See Function Navigator for Current Functional Status.   Therapy/Group: Individual Therapy  Raymondo Band 01/06/2017, 5:06 PM

## 2017-01-06 NOTE — Progress Notes (Signed)
Occupational Therapy Session Note  Patient Details  Name: Terry Juarez MRN: [id number] Date of Birth: 1966-05-10  Today's Date: 01/06/2017 OT Individual Time: 1345-1445 OT Individual Time Calculation (min): 60 min    Short Term Goals: Week 1:  OT Short Term Goal 1 (Week 1): Pt will be able to don a shirt with min A. OT Short Term Goal 2 (Week 1): Pt will demonstrate improved sitting balance by sitting statically on toilet with close S. OT Short Term Goal 3 (Week 1): Pt will complete squat pivot transfers on/off the toilet with min -mod A. OT Short Term Goal 4 (Week 1): Pt will demonstrate proficiency with self ROM of LUE with min cues or less. OT Short Term Goal 5 (Week 1): Pt will demonstrate improved LUE body awareness by using R hand to position L arm prior to transfers.  Skilled Therapeutic Interventions/Progress Updates:    Treatment session focused on neuromuscular reeducation, sitting balance, bil coordination, pt education, positioning, and transfer training. Pt educated on unilateral bed mobility with verbal cues for hand/foot placement and transitioned from bed to EOB sit with mod A and L sided hemiparesis. Pt completed lateral leans for postural control and dynamic balance while seated EOB during R UE reaching tasks. Pt demo'ed weakness in trunk during lateral core strengthening tasks and required min-mod A for balance correction. Pt completed EOB SPT to W/c with max A and verbal prompting for hand/foot placement. In w/c pt instructed on neuro reeducation using crossing midline and diagonal PNF patterns while sitting with HOH assistance. Pt practiced crossing midline, weight shifting, and weight bearing in chair for reaching activity from L<>R. Pts L UE provided with tactile input for weight bearing techniques. Pt instructed on optimal positioning of hemiparetic arm in w/c. Pt completed w/c mobility in chair with close Supervision for safety and L sided awareness in hallway ~50 ft back  to patient's room from therapy room. Pt left with in w/c with family present and call bell/phone in reach.   Therapy Documentation Precautions:  Precautions Precautions: Fall Precaution Comments: Peritoneal dialysis patient; L subluxed shoulder Restrictions Weight Bearing Restrictions: No Pain: Pain Assessment Pain Assessment: No/denies pain ADL: ADL ADL Comments: refer to functional navigator  See Function Navigator for Current Functional Status.   Therapy/Group: Individual Therapy  Delon [place] 01/06/2017, 4:00 PM

## 2017-01-06 NOTE — Progress Notes (Signed)
Terry Juarez Progress Note   Dialysis:PD 5 exchanges overnight, no day bag, no pause, 2000 volume, using all 1.5%- UF of 1.5 liters   Summary: Pt is a 51 y.o.yo femalewith ESRD on PD who was admitted on 6/16/2018with sz/hemiparesis- found to have right sided frontal lobe hemorrhage, also with PRES. Seizures controlled in [place]. ? / had cardiac arrest 12/25/16 in Inpt HD. Transferred to Rehab 01/04/17.  Problem/Plan: 1. Intracranial CVA/ hemorrhage- with L hemiparesis. Did not require surgery. PRES was also present. Was making some clinical recovery then had cardiac arrest at inpt dialysis on 12/25/17 therefore transitioned back to PD- now making progress again 2. ESRD- tolerating [facility] hemodialysis was complicated by arrest so she is now back on PD. Pt refusing return to HD. Per notes her family is willing to take overhome PD for pt. 7/2 K 4.1 They live near [place] and I believe they are actively trying to make arrangements for transition to another PD center closer to their home. She does not do day dwell as OP - will have her go dry during day  3. Volume/ HTN - Volume down with higher concentrate. Seems euvolemic now- changed to 1.5% 7/3.  AM wts: 48.8 7/2 49.7, 47.2 (likely bed scale) 7/4 1.5%;  7/3. Lowered BP meds some more w amlodipine 5 mg qday/ Carvedilol 12.5 mg bid. Losartan 50 mg hs.  4. Cardiac arrest - while on HD 6/22 5. Anemia- history of GIB and low hgb. Responded toowell to ESA and and Hb now >17. Thisis part of reason for hypertension. Holding ESA now.  6. MBD -hypercalcemia Ca 10.9 P 4 - stop calcium acetate- add back at a lower dose later if P goes up or add Fe based binder, VDRA stopped 7/2- repeat labs 7/5- on sensipar 60 daily  7. Nutrition   Alb 3 - regular diet ok, has Nepro and vits-   [name], PA-C [place] [phone] 01/06/2017,10:11 AM  LOS: 2 days    Patient seen and examined, agree with above note with  above modifications. Continues to make slow progress.  BP is great, maybe too low- have dec PD fluids to all 1.5% and will dec BP meds a little more.  She did not do day dwell as OP so will do exchanges only- no day dwell  Terry Parish, MD 01/06/2017     Subjective:  Feels like she can't eat because she has fluid left in her belly (2 L) in during the day    Objective Vitals:   01/05/17 1254 01/05/17 2152 01/06/17 0438 01/06/17 0700  BP: 123/88 113/67 114/78 110/62  Pulse: 70 66 68 72  Resp: 18  18 18   Temp: 97.8 F (36.6 C)  97.8 F (36.6 C) 97.6 F (36.4 C)  TempSrc: Oral  Oral Oral  SpO2:  95% 98%   Weight:   47.2 kg (104 lb)   Height:       Physical Exam General: thin AAF NAD Heart: RRR Lungs: no rales Abdomen: soft NT Extremities: no LE edema Dialysis Access: right AVGG + whistling bruit and PD cath   Additional Objective Labs: Basic Metabolic Panel:  Recent Labs Lab 01/02/17 0303 01/04/17 0238 01/04/17 2148  NA 139 136 135  K 3.8 3.7 4.1  CL 99* 96* 94*  CO2 27 27 25   GLUCOSE 130* 112* 117*  BUN 47* 55* 55*  CREATININE 11.39* 12.59* 12.13*  CALCIUM 10.6* 10.5* 10.9*  PHOS  --   --  4.0   Liver Function Tests:  Recent Labs Lab 01/04/17 2148  ALBUMIN 3.0*   CBC:  Recent Labs Lab 01/01/17 0623 01/04/17 2148  WBC 10.3 14.3*  HGB 16.9* 17.2*  HCT 54.2* 54.3*  MCV 103.2* 103.0*  PLT 352 313   Blood Culture    Component Value Date/Time   SDES BLOOD LEFT HAND 07/30/2016 0537   SPECREQUEST BOTTLES DRAWN AEROBIC ONLY 5CC 07/30/2016 0537   CULT NO GROWTH 5 DAYS 07/30/2016 0537   REPTSTATUS 08/04/2016 FINAL 07/30/2016 0537    Cardiac Enzymes: No results for input(s): CKTOTAL, CKMB, CKMBINDEX, TROPONINI in the last 168 hours. CBG: No results for input(s): GLUCAP in the last 168 hours. Iron Studies: No results for input(s): IRON, TIBC, TRANSFERRIN, FERRITIN in the last 72 hours. Lab Results  Component Value Date   INR 0.93 12/21/2016    INR 1.88 12/19/2016   INR 0.97 12/19/2016   Studies/Results: No results found. Medications: . sodium chloride    . sodium chloride    . dialysis solution 2.5% low-MG/low-CA    . dialysis solution 4.25% low-MG/low-CA     . amLODipine  5 mg Oral BID  . carvedilol  12.5 mg Oral BID WC  . cinacalcet  60 mg Oral Q supper  . famotidine  20 mg Oral Daily  . feeding supplement (NEPRO CARB STEADY)  237 mL Oral BID BM  . FLUoxetine  10 mg Oral Daily  . levETIRAcetam  500 mg Oral BID  . levothyroxine  88 mcg Oral QAC breakfast  . losartan  50 mg Oral QHS  . multivitamin  1 tablet Oral Daily  . pantoprazole  40 mg Oral BID  . polyethylene glycol  17 g Oral Daily  . senna-docusate  1 tablet Oral BID

## 2017-01-06 NOTE — Progress Notes (Signed)
Preadmission assessment reviewed and changes made  [facility] NOTE  Subjective/Complaints:  Pt seen laying in bed this AM.  She slept well overnight.  She states therapies were a workout yesterday.   ROS: Denies CP, SOB, N/V/D.  Objective: Vital Signs: Blood pressure 114/78, pulse 68, temperature 97.8 F (36.6 C), temperature source Oral, resp. rate 18, height 5\' 5"  (1.651 m), weight 47.2 kg (104 lb), SpO2 98 %. No results found.  Recent Labs  01/04/17 2148  WBC 14.3*  HGB 17.2*  HCT 54.3*  PLT 313    Recent Labs  01/04/17 0238 01/04/17 2148  NA 136 135  K 3.7 4.1  CL 96* 94*  GLUCOSE 112* 117*  BUN 55* 55*  CREATININE 12.59* 12.13*  CALCIUM 10.5* 10.9*   CBG (last 3)  No results for input(s): GLUCAP in the last 72 hours.  Wt Readings from Last 3 Encounters:  01/06/17 47.2 kg (104 lb)  01/04/17 50.7 kg (111 lb 12.4 oz)  09/04/16 54.4 kg (120 lb)    Physical Exam:  BP 114/78 (BP Location: Left Arm)   Pulse 68   Temp 97.8 F (36.6 C) (Oral)   Resp 18   Ht 5\' 5"  (1.651 m)   Wt 47.2 kg (104 lb)   SpO2 98%   BMI 17.31 kg/m  Constitutional: She appears well-developed. Frail  HENT: Normocephalic and atraumatic.  Eyes: EOMI. No discharge.  Cardiovascular: RRR.  No JVD. Respiratory: Effort normal and breath sounds normal.  GI: Soft. Bowel sounds are normal.   Musculoskeletal: She exhibits no edema or tenderness.  Neurological: She is alert.  Fair awareness of deficits.  Motor: RUE/RLE: 5/5 LUE/LLE: 0/5 Skin: Skin is warm and dry.  Psychiatric: Her behavior is normal. Her affect is blunt.    Assessment/Plan: 1. Functional deficits secondary to right frontal hemorrhage, small SAH and signs of PRES which require 3+ hours per day of interdisciplinary therapy in a comprehensive inpatient rehab setting. Physiatrist is providing close team supervision and 24 hour management of active medical problems listed  below. Physiatrist and rehab team continue to assess barriers to discharge/monitor patient progress toward functional and medical goals.  Function:  Bathing Bathing position   Position: Wheelchair/chair at sink  Bathing parts Body parts bathed by patient: Chest, Abdomen, Front perineal area, Right upper leg, Left upper leg, Right lower leg, Left arm Body parts bathed by helper: Left lower leg, Back, Right arm  Bathing assist Assist Level: Touching or steadying assistance(Pt > 75%)      Upper Body Dressing/Undressing Upper body dressing   What is the patient wearing?: Pull over shirt/dress     Pull over shirt/dress - Perfomed by patient: Put head through opening, Thread/unthread right sleeve Pull over shirt/dress - Perfomed by helper: Pull shirt over trunk, Thread/unthread left sleeve        Upper body assist Assist Level: Touching or steadying assistance(Pt > 75%)      Lower Body Dressing/Undressing Lower body dressing   What is the patient wearing?: Underwear, Pants, Non-skid slipper socks   Underwear - Performed by helper: Thread/unthread right underwear leg, Thread/unthread left underwear leg, Pull underwear up/down   Pants- Performed by helper: Thread/unthread right pants leg, Thread/unthread left pants leg, Pull pants up/down   Non-skid slipper socks- Performed by helper: Don/doff right sock, Don/doff left sock                  Lower body assist  Assist for lower body dressing: Touching or steadying assistance (Pt > 75%)      Toileting Toileting Toileting activity did not occur: No continent bowel/bladder event        Toileting assist     Transfers Chair/bed transfer   Chair/bed transfer method: Squat pivot Chair/bed transfer assist level: Maximal assist (Pt 25 - 49%/lift and lower) Chair/bed transfer assistive device: Armrests     Locomotion Ambulation     Max distance: 8 ft Assist level: 2 helpers   Wheelchair   Type: Manual Max wheelchair  distance: 152 Assist Level: Touching or steadying assistance (Pt > 75%)  Cognition Comprehension Comprehension assist level: Follows complex conversation/direction with extra time/assistive device  Expression Expression assist level: Expresses complex ideas: With extra time/assistive device  Social Interaction Social Interaction assist level: Interacts appropriately with others with medication or extra time (anti-anxiety, antidepressant).  Problem Solving Problem solving assist level: Solves basic 90% of the time/requires cueing < 10% of the time  Memory Memory assist level: Recognizes or recalls 90% of the time/requires cueing < 10% of the time    Medical Problem List and Plan: 1.  Decreased functional mobility secondary to right frontal hemorrhage, small SAH and signs of PRES  Cont CIR  PRAFO/WHO   Fluoxetine started 7/3 2.  DVT Prophylaxis/Anticoagulation: SCDs.   Vascular study neg 3. Pain Management: Tylenol as needed 4. Mood: Provide emotional support 5. Neuropsych: This patient is not fully capable of making decisions on her own behalf. 6. Skin/Wound Care: Routine skin checks 7. Fluids/Electrolytes/Nutrition: Routine I&Os 8. Seizure prophylaxis. Keppra 500 mg every 12 hours 9. ESRD with history of right renal cancer status post nephrectomy. Continue peritoneal dialysis as per renal services 10. Hypertension.  Norvasc 5 mg twice daily, Coreg 12.5 mg twice a day, Cozaar 50 mg daily  Monitor with increased mobility  Controlled 7/4 11. Hypothyroidism. Synthroid 12.Constipation. Laxative assistance 13. Hyperglycemia  Likely stress induced  Relatively controlled 7/4 14. Leukocytosis  WBCs 14.3 on 7/2  Cont to monitor  UA/Ucx pending   LOS (Days) 2 A FACE TO FACE EVALUATION WAS PERFORMED  Ankit [name] 01/06/2017 7:45 AM

## 2017-01-06 NOTE — Progress Notes (Signed)
Social Work Patient ID: Terry Juarez, female   DOB: [birth year], 51 y.o.   MRN: [id number]   CSW met with pt to update her on team conference discussion and targeted d/c date of 02-02-17.  Then, CSW spoke with Tedd Sias, pt's brother-in-law, via telephone.  He is working on finding someone to build a ramp, but having some difficulty.  CSW will put ramp specifications in pt's room.  CSW will also ask financial counseling to assist pt with Medicaid application.  CSW will continue to follow and assist as needed.

## 2017-01-06 NOTE — Progress Notes (Signed)
Speech Language Pathology Daily Session Note  Patient Details  Name: Terry Juarez MRN: [id number] Date of Birth: 1966-05-27  Today's Date: 01/06/2017 SLP Individual Time: 0900-0930 SLP Individual Time Calculation (min): 30 min  Short Term Goals: Week 1: SLP Short Term Goal 1 (Week 1): Patient will demonstrate functional problem solving for basic and familiar tasks with Min A verbal cues.  SLP Short Term Goal 2 (Week 1): Patient will recall new, daily information with Min A verbal cues and use of external aids.  SLP Short Term Goal 3 (Week 1): Patient will self-monitor and correct errors during functional tasks with Min A verbal cues.  SLP Short Term Goal 4 (Week 1): Patient will demonstrate selective attention in a mildly distracting enviornment to functional tasks for 30 minutes with Min A verbal cues for redirection.  SLP Short Term Goal 5 (Week 1): Patient will attend to left field of enviornment during functional tasks with Min A verbal cues.   Skilled Therapeutic Interventions: Skilled treatment session focused on cognitive goals. SLP facilitated session by providing supervision verbal cues for problem solving during a basic money management task. Patient demonstrated selective attention to task for ~20 minutes in a mildly distracting enviornment with Mod I for redirection.Patient left upright in bed with all needs within reach. Continue with current plan of care.      Function:  Eating Eating   Modified Consistency Diet: No Eating Assist Level: Set up assist for   Eating Set Up Assist For: Opening containers       Cognition Comprehension Comprehension assist level: Follows complex conversation/direction with extra time/assistive device  Expression   Expression assist level: Expresses complex 90% of the time/cues < 10% of the time  Social Interaction Social Interaction assist level: Interacts appropriately 90% of the time - Needs monitoring or encouragement for participation  or interaction.  Problem Solving Problem solving assist level: Solves basic 90% of the time/requires cueing < 10% of the time  Memory Memory assist level: Recognizes or recalls 90% of the time/requires cueing < 10% of the time    Pain Pain Assessment Pain Assessment: No/denies pain  Therapy/Group: Individual Therapy  Keyia Moretto 01/06/2017, 10:45 AM

## 2017-01-07 ENCOUNTER — Inpatient Hospital Stay (HOSPITAL_COMMUNITY): Payer: BLUE CROSS/BLUE SHIELD | Admitting: Occupational Therapy

## 2017-01-07 ENCOUNTER — Inpatient Hospital Stay (HOSPITAL_COMMUNITY): Payer: BLUE CROSS/BLUE SHIELD | Admitting: Physical Therapy

## 2017-01-07 ENCOUNTER — Inpatient Hospital Stay (HOSPITAL_COMMUNITY): Payer: BLUE CROSS/BLUE SHIELD | Admitting: Speech Pathology

## 2017-01-07 ENCOUNTER — Inpatient Hospital Stay (HOSPITAL_COMMUNITY): Payer: BLUE CROSS/BLUE SHIELD

## 2017-01-07 LAB — RENAL FUNCTION PANEL
Albumin: 2.7 g/dL — ABNORMAL LOW (ref 3.5–5.0)
Anion gap: 15 (ref 5–15)
BUN: 62 mg/dL — ABNORMAL HIGH (ref 6–20)
CO2: 23 mmol/L (ref 22–32)
Calcium: 8.2 mg/dL — ABNORMAL LOW (ref 8.9–10.3)
Chloride: 95 mmol/L — ABNORMAL LOW (ref 101–111)
Creatinine, Ser: 12.34 mg/dL — ABNORMAL HIGH (ref 0.44–1.00)
GFR calc Af Amer: 4 mL/min — ABNORMAL LOW (ref >60)
GFR calc non Af Amer: 3 mL/min — ABNORMAL LOW (ref >60)
Glucose, Bld: 129 mg/dL — ABNORMAL HIGH (ref 65–99)
Phosphorus: 4.6 mg/dL (ref 2.5–4.6)
Potassium: 3.7 mmol/L (ref 3.5–5.1)
Sodium: 133 mmol/L — ABNORMAL LOW (ref 135–145)

## 2017-01-07 LAB — CBC
HCT: 49.7 % — ABNORMAL HIGH (ref 36.0–46.0)
Hemoglobin: 15.5 g/dL — ABNORMAL HIGH (ref 12.0–15.0)
MCH: 31.6 pg (ref 26.0–34.0)
MCHC: 31.2 g/dL (ref 30.0–36.0)
MCV: 101.2 fL — ABNORMAL HIGH (ref 78.0–100.0)
Platelets: 290 10*3/uL (ref 150–400)
RBC: 4.91 MIL/uL (ref 3.87–5.11)
RDW: 14.9 % (ref 11.5–15.5)
WBC: 9.1 10*3/uL (ref 4.0–10.5)

## 2017-01-07 NOTE — Progress Notes (Signed)
Speech Language Pathology Daily Session Note  Patient Details  Name: Terry Juarez MRN: [id number] Date of Birth: 07-12-1965  Today's Date: 01/07/2017 SLP Individual Time: 1300-1345 SLP Individual Time Calculation (min): 45 min  Short Term Goals: Week 1: SLP Short Term Goal 1 (Week 1): Patient will demonstrate functional problem solving for basic and familiar tasks with Min A verbal cues.  SLP Short Term Goal 2 (Week 1): Patient will recall new, daily information with Min A verbal cues and use of external aids.  SLP Short Term Goal 3 (Week 1): Patient will self-monitor and correct errors during functional tasks with Min A verbal cues.  SLP Short Term Goal 4 (Week 1): Patient will demonstrate selective attention in a mildly distracting enviornment to functional tasks for 30 minutes with Min A verbal cues for redirection.  SLP Short Term Goal 5 (Week 1): Patient will attend to left field of enviornment during functional tasks with Min A verbal cues.   Skilled Therapeutic Interventions: Skilled treatment session focused on cognitive goals. SLP facilitated session by providing Min A verbal cues for problem solving and selective attention in a moderately distracting environment for ~30 minutes during a mildly complex money management task. Patient demonstrated emergent awareness in regards to decreased attention to task. Patient transferred back to bed at end of session with alarm on and all needs within reach. Continue with current plan of care.     Function:    Cognition Comprehension Comprehension assist level: Follows complex conversation/direction with extra time/assistive device  Expression   Expression assist level: Expresses complex 90% of the time/cues < 10% of the time  Social Interaction Social Interaction assist level: Interacts appropriately 90% of the time - Needs monitoring or encouragement for participation or interaction.  Problem Solving Problem solving assist level: Solves  basic 90% of the time/requires cueing < 10% of the time  Memory Memory assist level: Recognizes or recalls 90% of the time/requires cueing < 10% of the time    Pain Pain Assessment Pain Assessment: No/denies pain  Therapy/Group: Individual Therapy  Denni France 01/07/2017, 4:40 PM

## 2017-01-07 NOTE — Progress Notes (Signed)
[facility] Progress Note   Subjective:    Machine beeper again all night long again!.  Discussed with Matt Dialysis RN, some leakage on the floor from bags- cannot tell where the leak is from- he will contact support people for dialysis machine.  Objective Vitals:   01/06/17 0700 01/06/17 1500 01/06/17 1845 01/07/17 0644  BP: 110/62 121/85 116/75 103/60  Pulse: 72 73 72 70  Resp: 18 19 18 18   Temp: 97.6 F (36.4 C) 98 F (36.7 C) 98.9 F (37.2 C) 98.5 F (36.9 C)  TempSrc: Oral Oral Oral Oral  SpO2:  97% 96% 96%  Weight:   49.8 kg (109 lb 12.6 oz) 52.2 kg (115 lb)  Height:       Physical Exam General: slender AAF NAD Heart: RRR Lungs: no rales Abdomen: soft NT Extremities: no LE edema Dialysis Access:  PD cath  right AVGG + whistling bruit  Dialysis:PD 5 exchanges overnight, no day bag, no pause, 2000 volume, using all 1.5%- UF of 1.5 liters   Summary: Pt is a 51 y.o.yo femalewith ESRD on PD who was admitted on 6/16/2018with sz/hemiparesis- found to have right sided frontal lobe hemorrhage, alsowith PRES. Seizures controlled in [place]. ? / had cardiac arrest 12/25/16 in Inpt HD. Transferred to Rehab 01/04/17.  Problem/Plan: 1. Intracranial CVA/ hemorrhage- with L hemiparesis. Did not require surgery. PRES was also present. Was making some clinical recovery then had cardiac arrest at inpt dialysis on 12/25/17 therefore transitioned back to PD- now making progress again 2. ESRD- tolerating [facility] hemodialysis was complicated by arrest so she is now back on PD. Pt refusing return to HD. Per notes her family is willing to take overhome PD for pt. 7/2 K 4.1 They live near [place] and I believe they are actively trying to make arrangements for transition to another PD center closer to their home. Daytime dwell stopped - not doing before.  K 3.7.  Na 133 Little lower today  3. Volume/ HTN - Volume down with higher concentrate. Seems euvolemic now- changed to  1.5% 7/3, back to  Lowered BP meds some more w amlodipine 5 mg qday/ Carvedilol 12.5 mg bid. Losartan 50 mg hs. Weights variable -  UF +580 today - does not appear volume overloaded on exam. BP ok. All weights have been bed weights; suspect she may have fluid in belly when weighed  today.  May need to back off on BP meds and do 2.5 and 1.5 for tomorrow - will discuss with Dr. Lorrene Reid.  Losartan not even given past 2 nights d/t SBP <120 so will stop. Would rather stop amlodipine altogether also and can re-add if need be. All 1.5 % dialysate for tonight, no pause, no daytime dwell. 4. Cardiac arrest - while on HD 6/22 5. Anemia- history of GIB and low hgb. Responded toowell to ESA and and Hb now >17. Thisis part of reason for hypertension. Holding ESA now.  6. MBD -hypercalcemia Ca 10.9  On 7/2 down to 8.2 today P 4.6 - stopped calcium acetate- add back at a lower dose later if P goes up or add Fe based binder, VDRA stopped 7/2- repeat labs 7/6- on sensipar 60 daily  7. Nutrition Alb 3 - regular diet ok, has Nepro and vits-    Terry Jacobson, PA-C [place] [phone] 01/07/2017,8:54 AM  LOS: 3 days    I have seen and examined this patient and agree with plan and assessment in the above note  with renal recommendations/intervention highlighted. BP's low side, stopping losartan (not given past 2 nights 2/2 systolic <536, and amlodipine. Continue carvedilol and add back meds it BP creeps back up. All 1.5's tonight. Hope for no technical issues with the machine.  Terry Menees B,MD 01/07/2017 1:08 PM    Recent Labs Lab 01/04/17 0238 01/04/17 2148 01/07/17 0630  NA 136 135 133*  K 3.7 4.1 3.7  CL 96* 94* 95*  CO2 27 25 23   GLUCOSE 112* 117* 129*  BUN 55* 55* 62*  CREATININE 12.59* 12.13* 12.34*  CALCIUM 10.5* 10.9* 8.2*  PHOS  --  4.0 4.6     Recent Labs Lab 01/04/17 2148 01/07/17 0630  ALBUMIN 3.0* 2.7*     Recent Labs Lab 01/01/17 0623  01/04/17 2148 01/07/17 0630  WBC 10.3 14.3* 9.1  HGB 16.9* 17.2* 15.5*  HCT 54.2* 54.3* 49.7*  MCV 103.2* 103.0* 101.2*  PLT 352 313 290    Lab Results  Component Value Date   INR 0.93 12/21/2016   INR 1.88 12/19/2016   INR 0.97 12/19/2016    Medications: . sodium chloride    . sodium chloride    . dialysis solution 2.5% low-MG/low-CA    . dialysis solution 4.25% low-MG/low-CA     . amLODipine  5 mg Oral Daily  . carvedilol  12.5 mg Oral BID WC  . cinacalcet  60 mg Oral Q supper  . famotidine  20 mg Oral Daily  . feeding supplement (NEPRO CARB STEADY)  237 mL Oral BID BM  . FLUoxetine  10 mg Oral Daily  . levETIRAcetam  500 mg Oral BID  . levothyroxine  88 mcg Oral QAC breakfast  . losartan  50 mg Oral QHS  . multivitamin  1 tablet Oral Daily  . pantoprazole  40 mg Oral BID  . polyethylene glycol  17 g Oral Daily  . senna-docusate  1 tablet Oral BID

## 2017-01-07 NOTE — Progress Notes (Signed)
[facility] PHYSICAL MEDICINE & REHABILITATION     PROGRESS NOTE  Subjective/Complaints:  Pt seen laying in bed this AM.  She did not sleep well overnight because her PD machine kept beeping.    ROS: Denies CP, SOB, N/V/D.  Objective: Vital Signs: Blood pressure 103/60, pulse 70, temperature 98.5 F (36.9 C), temperature source Oral, resp. rate 18, height 5\' 5"  (1.651 m), weight 52.2 kg (115 lb), SpO2 96 %. No results found.  Recent Labs  01/04/17 2148 01/07/17 0630  WBC 14.3* 9.1  HGB 17.2* 15.5*  HCT 54.3* 49.7*  PLT 313 290    Recent Labs  01/04/17 2148 01/07/17 0630  NA 135 133*  K 4.1 3.7  CL 94* 95*  GLUCOSE 117* 129*  BUN 55* 62*  CREATININE 12.13* 12.34*  CALCIUM 10.9* 8.2*   CBG (last 3)  No results for input(s): GLUCAP in the last 72 hours.  Wt Readings from Last 3 Encounters:  01/07/17 52.2 kg (115 lb)  01/04/17 50.7 kg (111 lb 12.4 oz)  09/04/16 54.4 kg (120 lb)    Physical Exam:  BP 103/60 (BP Location: Left Wrist)   Pulse 70   Temp 98.5 F (36.9 C) (Oral)   Resp 18   Ht 5\' 5"  (1.651 m)   Wt 52.2 kg (115 lb)   SpO2 96%   BMI 19.14 kg/m  Constitutional: She appears well-developed. Frail  HENT: Normocephalic and atraumatic.  Eyes: EOMI. No discharge.  Cardiovascular: RRR.  No JVD. Respiratory: Effort normal and breath sounds normal.  GI: Soft. Bowel sounds are normal.   Musculoskeletal: She exhibits no edema or tenderness.  Neurological: She is alert.  Fair awareness of deficits.  Motor: RUE/RLE: 5/5 LUE/LLE: 0/5 No increase in tone Skin: Skin is warm and dry.  Psychiatric: Her behavior is normal. Her affect is blunt.    Assessment/Plan: 1. Functional deficits secondary to right frontal hemorrhage, small SAH and signs of PRES which require 3+ hours per day of interdisciplinary therapy in a comprehensive inpatient rehab setting. Physiatrist is providing close team supervision and 24 hour management of active medical problems listed  below. Physiatrist and rehab team continue to assess barriers to discharge/monitor patient progress toward functional and medical goals.  Function:  Bathing Bathing position   Position: Wheelchair/chair at sink  Bathing parts Body parts bathed by patient: Chest, Abdomen, Front perineal area, Right upper leg, Left upper leg, Right lower leg, Left arm, Left lower leg, Buttocks Body parts bathed by helper: Right arm, Back  Bathing assist Assist Level: Touching or steadying assistance(Pt > 75%)      Upper Body Dressing/Undressing Upper body dressing   What is the patient wearing?: Pull over shirt/dress     Pull over shirt/dress - Perfomed by patient: Put head through opening, Thread/unthread right sleeve Pull over shirt/dress - Perfomed by helper: Pull shirt over trunk, Thread/unthread left sleeve        Upper body assist Assist Level: Touching or steadying assistance(Pt > 75%)      Lower Body Dressing/Undressing Lower body dressing   What is the patient wearing?: Underwear, Pants, Non-skid slipper socks, Socks, Shoes Underwear - Performed by patient: Pull underwear up/down (able to pull up 50%) Underwear - Performed by helper: Thread/unthread right underwear leg, Thread/unthread left underwear leg, Pull underwear up/down Pants- Performed by patient: Pull pants up/down Pants- Performed by helper: Thread/unthread right pants leg, Thread/unthread left pants leg   Non-skid slipper socks- Performed by helper: Don/doff right sock, Don/doff left sock  Socks - Performed by patient: Don/doff right sock, Don/doff left sock     Shoes - Performed by helper: Don/doff right shoe, Don/doff left shoe, Fasten right, Fasten left          Lower body assist Assist for lower body dressing:  (MaxA )      Toileting Toileting Toileting activity did not occur: No continent bowel/bladder event        Toileting assist     Transfers Chair/bed transfer   Chair/bed transfer method: Squat  pivot Chair/bed transfer assist level: Maximal assist (Pt 25 - 49%/lift and lower) Chair/bed transfer assistive device: Armrests     Locomotion Ambulation     Max distance: 8 ft Assist level: 2 helpers   Wheelchair   Type: Manual Max wheelchair distance: 152 Assist Level: Touching or steadying assistance (Pt > 75%)  Cognition Comprehension Comprehension assist level: Follows complex conversation/direction with extra time/assistive device  Expression Expression assist level: Expresses complex 90% of the time/cues < 10% of the time  Social Interaction Social Interaction assist level: Interacts appropriately 90% of the time - Needs monitoring or encouragement for participation or interaction.  Problem Solving Problem solving assist level: Solves basic 90% of the time/requires cueing < 10% of the time  Memory Memory assist level: Recognizes or recalls 90% of the time/requires cueing < 10% of the time    Medical Problem List and Plan: 1.  Decreased functional mobility secondary to right frontal hemorrhage, small SAH and signs of PRES  Cont CIR  PRAFO/WHO   Fluoxetine started 7/3 2.  DVT Prophylaxis/Anticoagulation: SCDs.   Vascular study neg 3. Pain Management: Tylenol as needed 4. Mood: Provide emotional support 5. Neuropsych: This patient is not fully capable of making decisions on her own behalf. 6. Skin/Wound Care: Routine skin checks 7. Fluids/Electrolytes/Nutrition: Routine I&Os 8. Seizure prophylaxis. Keppra 500 mg every 12 hours 9. ESRD with history of right renal cancer status post nephrectomy. Continue peritoneal dialysis as per renal services 10. Hypertension.  Norvasc 5 mg twice daily, Coreg 12.5 mg twice a day, Cozaar 50 mg daily  Monitor with increased mobility  Controlled 7/5 11. Hypothyroidism. Synthroid 12.Constipation. Laxative assistance 13. Hyperglycemia  Likely stress induced  Relatively controlled 7/5 14. Leukocytosis  WBCs 9.1 on 7/5  Cont to  monitor  UA/Ucx remains pending   LOS (Days) 3 A FACE TO FACE EVALUATION WAS PERFORMED  Jayleon Mcfarlane [name] 01/07/2017 8:00 AM

## 2017-01-07 NOTE — Progress Notes (Signed)
Occupational Therapy Session Note  Patient Details  Name: Terry Juarez MRN: [id number] Date of Birth: 1966-04-24  Today's Date: 01/07/2017 OT Individual Time: 3524-8185 OT Individual Time Calculation (min): 31 min    Short Term Goals: Week 1:  OT Short Term Goal 1 (Week 1): Pt will be able to don a shirt with min A. OT Short Term Goal 2 (Week 1): Pt will demonstrate improved sitting balance by sitting statically on toilet with close S. OT Short Term Goal 3 (Week 1): Pt will complete squat pivot transfers on/off the toilet with min -mod A. OT Short Term Goal 4 (Week 1): Pt will demonstrate proficiency with self ROM of LUE with min cues or less. OT Short Term Goal 5 (Week 1): Pt will demonstrate improved LUE body awareness by using R hand to position L arm prior to transfers.  Skilled Therapeutic Interventions/Progress Updates:    Pt received supine in bed, agreeable to OT treatment session. Focus of session on LUE NMR, dynamic sitting balance, functional mobility transfers. Pt completed supine to sitting EOB with ModA, demonstrates using R LE to assist L LE for moving over EOB, ultimately needs assist to achieve. MaxA for stand pivot transfer EOB>w/c to R side. Total A to therapy gym via w/c where Pt completed stand pivot transfer w/c<>mat table with MaxA (transfer to R and L side). Pt completed seated dynamic reaching activity, reaching and placing letters/numbers in varying sequential order and reaching to engage in drawing activities on mirror placed in front of Pt. Mirror also utilized for visual feedback of Pt positioning in sitting. Pt demonstrates static sitting balance with supervision, requires [place] to O'Brien during dynamic reaching activity. Pt reaching with R UE and with therapist facilitating wt bearing through L UE throughout. Pt with tendency to lose balance when leaning to L, requires Min-ModA to stabilize and self correct and maintain sitting balance.  Pt returned to room in  manner previously mentioned. Pt left sitting in w/c, lap board placed on w/c, family/visitors present, needs within reach.   Therapy Documentation Precautions:  Precautions Precautions: Fall Precaution Comments: Peritoneal dialysis patient; L subluxed shoulder Restrictions Weight Bearing Restrictions: No   Pain: Pain Assessment Pain Assessment: No/denies pain ADL: ADL ADL Comments: refer to functional navigator  See Function Navigator for Current Functional Status.   Therapy/Group: Individual Therapy  Raymondo Band 01/07/2017, 4:36 PM

## 2017-01-07 NOTE — Progress Notes (Signed)
Occupational Therapy Session Note  Patient Details  Name: Terry Juarez MRN: [id number] Date of Birth: 07/10/1965  Today's Date: 01/07/2017 OT Individual Time: 7654-6503 OT Individual Time Calculation (min): 31 min    Short Term Goals: Week 1:  OT Short Term Goal 1 (Week 1): Pt will be able to don a shirt with min A. OT Short Term Goal 2 (Week 1): Pt will demonstrate improved sitting balance by sitting statically on toilet with close S. OT Short Term Goal 3 (Week 1): Pt will complete squat pivot transfers on/off the toilet with min -mod A. OT Short Term Goal 4 (Week 1): Pt will demonstrate proficiency with self ROM of LUE with min cues or less. OT Short Term Goal 5 (Week 1): Pt will demonstrate improved LUE body awareness by using R hand to position L arm prior to transfers.  Skilled Therapeutic Interventions/Progress Updates:    Upon entering the room, pt seated in wheelchair awaiting therapist with no c/o pain this session. Pt agreeable to OT intervention. OT propelled pt via wheelchair to dayroom for time management. Pt engaged in card game with hand held assistance for L UE to hold cards. Pt required min cues to visually scan cards on table to locate matches on the L. Pt engaged in table slides for L UE with use of R UE to assist as needed with task. No pain reported with activity. Trace movement noted in bicep. Pt returned to room and transferred with max A stand pivot for lifting and lowering. Pt required assistance with L LE and trunk for sit >supine. Pt supine in bed with bed alarm activated and call bell within reach upon exiting the room.   Therapy Documentation Precautions:  Precautions Precautions: Fall Precaution Comments: Peritoneal dialysis patient; L subluxed shoulder Restrictions Weight Bearing Restrictions: No General:   Vital Signs: Therapy Vitals Temp: 98.1 F (36.7 C) Temp Source: Oral Pulse Rate: 68 Resp: 18 BP: 116/72 Patient Position (if appropriate):  Lying Oxygen Therapy SpO2: 98 % O2 Device: Not Delivered Pain: Pain Assessment Pain Assessment: No/denies pain ADL: ADL ADL Comments: refer to functional navigator Vision   Perception    Praxis   Exercises:   Other Treatments:    See Function Navigator for Current Functional Status.   Therapy/Group: Individual Therapy  Gypsy Decant 01/07/2017, 4:37 PM

## 2017-01-07 NOTE — Progress Notes (Signed)
Physical Therapy Session Note  Patient Details  Name: Terry Juarez MRN: [id number] Date of Birth: 09-18-1965  Today's Date: 01/07/2017 PT Individual Time: 1130-1200 PT Individual Time Calculation (min): 30 min   Short Term Goals: Week 1:  PT Short Term Goal 1 (Week 1): Pt will transfer from bed to w/c with moderate assist in either direction  PT Short Term Goal 2 (Week 1): Pt will perform all bed mobility with min assist PT Short Term Goal 3 (Week 1): Pt will demonstrate dynamic standing balance for functional tasks requiring moderate assistance PT Short Term Goal 4 (Week 1): Pt will propel w/c 50 ft in a controlled environment with supervision   Skilled Therapeutic Interventions/Progress Updates:    Pt transferred to the mat to the R, squat pivot transfer with Mod assist for balance and cues to reach with R UE. Worked on dynamic sitting balance while reaching for cones in all directions and picking cones up off the floor using R UE. Pt required Max assist at times during this activity in order to correct loss of balance to the L. Worked on static and dynamic standing balance using mirror for biofeedback. Pt worked on reaching for [employer] sticking notes off the mirror with R UE, increased L lateral trunk lean facilitation for weightshift over L LE, and facilitation for hip extension, blocking L knee to prevent buckling. Pt propelled w/c back to room with supervision and verbal cues to watch for obstacles on L side. Pt left sitting in w/c with call bell in reach and family in the room.    Therapy Documentation Precautions:  Precautions Precautions: Fall Precaution Comments: Peritoneal dialysis patient; L subluxed shoulder Restrictions Weight Bearing Restrictions: No   See Function Navigator for Current Functional Status.   Therapy/Group: Individual Therapy  Drema Dallas Schagen 01/07/2017, 12:04 PM

## 2017-01-07 NOTE — Progress Notes (Signed)
Physical Therapy Session Note  Patient Details  Name: Terry Juarez MRN: [id number] Date of Birth: 09-09-1965  Today's Date: 01/06/2017 PT Individual Time: 6147 -1600   43 min   Short Term Goals: Week 1:  PT Short Term Goal 1 (Week 1): Pt will transfer from bed to w/c with moderate assist in either direction  PT Short Term Goal 2 (Week 1): Pt will perform all bed mobility with min assist PT Short Term Goal 3 (Week 1): Pt will demonstrate dynamic standing balance for functional tasks requiring moderate assistance PT Short Term Goal 4 (Week 1): Pt will propel w/c 50 ft in a controlled environment with supervision   Skilled Therapeutic Interventions/Progress Updates:   Pt received sitting in WC and agreeable to PT.   WC mobility instructed by PT with supervision assist x 173f.   Squat pivot transfers instructed by PT x 2 each direction. Mod assist to the R and Max assist to the L. Max cues for set up and sequecning.   Static Sitting balance with overall supervision assist and 2 instances of total assist with L lateral LOB.   Dynamic sitting balance with L lateral lean and no UE support with mod-max assist assist to prevent L lateral LOB. R Lateral reaching to facilitate L trunk activation. Forced WB through UE on mat table to correct L weight shift.   LUE shoulder flexion / extentionwith support on fold out table 3 x 10 with mod assist from PT to support UE. Noted improvement in ROM with each bout.   [facility] and all needs met.        Therapy Documentation Precautions:  Precautions Precautions: Fall Precaution Comments: Peritoneal dialysis patient; L subluxed shoulder Restrictions Weight Bearing Restrictions: No Pain: 0/10     See Function Navigator for Current Functional Status.   Therapy/Group: Individual Therapy  ALorie Phenix7/11/2016, 6:06 AM

## 2017-01-07 NOTE — Progress Notes (Signed)
PD fluid leaked overnight, unknown origin.  Patient disconnected, camped and clamped.

## 2017-01-07 NOTE — Progress Notes (Signed)
Physical Therapy Session Note  Patient Details  Name: Terry Juarez MRN: [id number] Date of Birth: 12-23-1965  Today's Date: 01/07/2017 PT Individual Time: 1000-1100 PT Individual Time Calculation (min): 60 min   Short Term Goals: Week 1:  PT Short Term Goal 1 (Week 1): Pt will transfer from bed to w/c with moderate assist in either direction  PT Short Term Goal 2 (Week 1): Pt will perform all bed mobility with min assist PT Short Term Goal 3 (Week 1): Pt will demonstrate dynamic standing balance for functional tasks requiring moderate assistance PT Short Term Goal 4 (Week 1): Pt will propel w/c 50 ft in a controlled environment with supervision   Skilled Therapeutic Interventions/Progress Updates:    no c/o of pain. PT treatment session focused on dynamic sitting balance and NMR via gait.  Pt supine in bed upon arrival and agreeable to PT treatment session. Pt requires max A to don shoes while supine in bed. Pt performs squat pivot transfer from bed to w/c with mod A to the R. Pt performs w/c mobility for 149ft from room to therapy gym with supervision. Pt performs a horseshoe game for dynamic sitting balance with R UE reaches towards the R to work on seated weight shifting and trunk reactions to correct upright posture during LOB to the L. PT provides weight bearing through the L UE and LE with stabilization to the L foot to assist with maintaining upright posture. Pt has LOB to the L requiring mod to max A for correction with multimodal cues for postural control with trunk elongation/shortening. Pt ambulates with handrail on the R 2x68ft requiring mod A for the L LE during stance and swing phase as well as multimodal cues for upright trunk posture. Pt ambulates 39ft with hemiwalker in R UE with mod A with the L LE during stance and swing phase. Pt performs w/c mobility to room with supervision. Pt left in w/c with QRB in place and call bell in reach.    Therapy Documentation Precautions:   Precautions Precautions: Fall Precaution Comments: Peritoneal dialysis patient; L subluxed shoulder Restrictions Weight Bearing Restrictions: No   See Function Navigator for Current Functional Status.   Therapy/Group: Individual Therapy  Amun Stemm 01/07/2017, 12:32 PM

## 2017-01-08 ENCOUNTER — Inpatient Hospital Stay (HOSPITAL_COMMUNITY): Payer: BLUE CROSS/BLUE SHIELD

## 2017-01-08 ENCOUNTER — Inpatient Hospital Stay (HOSPITAL_COMMUNITY): Payer: BLUE CROSS/BLUE SHIELD | Admitting: Speech Pathology

## 2017-01-08 LAB — CBC
HEMATOCRIT: 49.1 % — AB (ref 36.0–46.0)
Hemoglobin: 15.4 g/dL — ABNORMAL HIGH (ref 12.0–15.0)
MCH: 31.7 pg (ref 26.0–34.0)
MCHC: 31.4 g/dL (ref 30.0–36.0)
MCV: 101 fL — AB (ref 78.0–100.0)
Platelets: 262 10*3/uL (ref 150–400)
RBC: 4.86 MIL/uL (ref 3.87–5.11)
RDW: 14.8 % (ref 11.5–15.5)
WBC: 8.3 10*3/uL (ref 4.0–10.5)

## 2017-01-08 MED FILL — Medication: Qty: 1 | Status: AC

## 2017-01-08 NOTE — Progress Notes (Signed)
Physical Therapy Session Note  Patient Details  Name: Terry Juarez MRN: [id number] Date of Birth: Feb 10, 1966  Today's Date: 01/08/2017 PT Individual Time: [phone] PT Individual Time Calculation (min): 30 min   Short Term Goals: Week 1:  PT Short Term Goal 1 (Week 1): Pt will transfer from bed to w/c with moderate assist in either direction  PT Short Term Goal 2 (Week 1): Pt will perform all bed mobility with min assist PT Short Term Goal 3 (Week 1): Pt will demonstrate dynamic standing balance for functional tasks requiring moderate assistance PT Short Term Goal 4 (Week 1): Pt will propel w/c 50 ft in a controlled environment with supervision   Skilled Therapeutic Interventions/Progress Updates:   Pt in w/c upon arrival and agreeable to therapy.   Performed 354f w/c mobility with R UE/LE use, supervision. Verbal cues for direction to therapy gym. No signs or symptoms of fatigue.   Performed w/c-chair transfer to/from NuStep w/ Mod A, stand pivot.   -2 bouts, 5 min each @ L1 to increased functional LE strength, replicate reciprocal movement pattern, and increased endurance  -Mod A to maintain L hip abduction during motion   -Pt able to perform reciprocal movement pattern >75% of time requiring manual assist w/ LLE <25% of time  Pt transferred w/c to EOB and to supine w/ Mod A at end of session. Pt remained in bed, call bell within reach, all needs met and NT made aware of status.   Therapy Documentation Precautions:  Precautions Precautions: Fall Precaution Comments: Peritoneal dialysis patient; L subluxed shoulder Restrictions Weight Bearing Restrictions: No General:   Vital Signs: Therapy Vitals Temp: 98.2 F (36.8 C) Temp Source: Oral Pulse Rate: 67 Resp: 16 BP: 120/70 Patient Position (if appropriate): Lying Oxygen Therapy SpO2: 97 % O2 Device: Not Delivered  See Function Navigator for Current Functional Status.   Therapy/Group: Individual Therapy  Valerie Fredin K  Arnette 01/08/2017, 4:33 PM

## 2017-01-08 NOTE — Progress Notes (Signed)
[place] PHYSICAL MEDICINE & REHABILITATION     PROGRESS NOTE  Subjective/Complaints:  Pt seen laying in bed this Am.  She slept better overnight.    ROS: Denies CP, SOB, N/V/D.  Objective: Vital Signs: Blood pressure 125/82, pulse 72, temperature 98.7 F (37.1 C), temperature source Oral, resp. rate 14, height 5\' 5"  (1.651 m), weight 51.7 kg (113 lb 15.7 oz), SpO2 98 %. No results found.  Recent Labs  01/07/17 0630  WBC 9.1  HGB 15.5*  HCT 49.7*  PLT 290    Recent Labs  01/07/17 0630  NA 133*  K 3.7  CL 95*  GLUCOSE 129*  BUN 62*  CREATININE 12.34*  CALCIUM 8.2*   CBG (last 3)  No results for input(s): GLUCAP in the last 72 hours.  Wt Readings from Last 3 Encounters:  01/08/17 51.7 kg (113 lb 15.7 oz)  01/04/17 50.7 kg (111 lb 12.4 oz)  09/04/16 54.4 kg (120 lb)    Physical Exam:  BP 125/82 (BP Location: Left Wrist)   Pulse 72   Temp 98.7 F (37.1 C) (Oral)   Resp 14   Ht 5\' 5"  (1.651 m)   Wt 51.7 kg (113 lb 15.7 oz)   SpO2 98%   BMI 18.97 kg/m  Constitutional: She appears well-developed. Frail  HENT: Normocephalic and atraumatic.  Eyes: EOMI. No discharge.  Cardiovascular: RRR.  No JVD. Respiratory: Effort normal and breath sounds normal.  GI: Soft. Bowel sounds are normal.   Musculoskeletal: She exhibits no edema or tenderness.  Neurological: She is alert.  Fair awareness of deficits.  Motor: RUE/RLE: 5/5 LUE/LLE: 0/5 No increase in tone Skin: Skin is warm and dry.  Psychiatric: Her behavior is normal. Her affect is blunt.    Assessment/Plan: 1. Functional deficits secondary to right frontal hemorrhage, small SAH and signs of PRES which require 3+ hours per day of interdisciplinary therapy in a comprehensive inpatient rehab setting. Physiatrist is providing close team supervision and 24 hour management of active medical problems listed below. Physiatrist and rehab team continue to assess barriers to discharge/monitor patient progress  toward functional and medical goals.  Function:  Bathing Bathing position   Position: Wheelchair/chair at sink  Bathing parts Body parts bathed by patient: Chest, Abdomen, Front perineal area, Right upper leg, Left upper leg, Right lower leg, Left arm, Left lower leg, Buttocks Body parts bathed by helper: Right arm, Back  Bathing assist Assist Level: Touching or steadying assistance(Pt > 75%)      Upper Body Dressing/Undressing Upper body dressing   What is the patient wearing?: Pull over shirt/dress     Pull over shirt/dress - Perfomed by patient: Put head through opening, Thread/unthread right sleeve Pull over shirt/dress - Perfomed by helper: Pull shirt over trunk, Thread/unthread left sleeve        Upper body assist Assist Level: Touching or steadying assistance(Pt > 75%)      Lower Body Dressing/Undressing Lower body dressing   What is the patient wearing?: Underwear, Pants, Non-skid slipper socks, Socks, Shoes Underwear - Performed by patient: Pull underwear up/down (able to pull up 50%) Underwear - Performed by helper: Thread/unthread right underwear leg, Thread/unthread left underwear leg, Pull underwear up/down Pants- Performed by patient: Pull pants up/down Pants- Performed by helper: Thread/unthread right pants leg, Thread/unthread left pants leg   Non-skid slipper socks- Performed by helper: Don/doff right sock, Don/doff left sock Socks - Performed by patient: Don/doff right sock, Don/doff left sock     Shoes -  Performed by helper: Don/doff right shoe, Don/doff left shoe, Fasten right, Fasten left          Lower body assist Assist for lower body dressing:  (MaxA )      Toileting Toileting     Toileting steps completed by helper: Adjust clothing prior to toileting, Performs perineal hygiene, Adjust clothing after toileting (per Dorian Heckle, NT)    Toileting assist     Transfers Chair/bed transfer   Chair/bed transfer method: Stand pivot Chair/bed  transfer assist level: Maximal assist (Pt 25 - 49%/lift and lower) Chair/bed transfer assistive device: Armrests     Locomotion Ambulation     Max distance: 8 ft Assist level: 2 helpers   Wheelchair   Type: Manual Max wheelchair distance: 152 Assist Level: Touching or steadying assistance (Pt > 75%)  Cognition Comprehension Comprehension assist level: Follows complex conversation/direction with extra time/assistive device  Expression Expression assist level: Expresses complex 90% of the time/cues < 10% of the time  Social Interaction Social Interaction assist level: Interacts appropriately 90% of the time - Needs monitoring or encouragement for participation or interaction.  Problem Solving Problem solving assist level: Solves basic 90% of the time/requires cueing < 10% of the time  Memory Memory assist level: Recognizes or recalls 90% of the time/requires cueing < 10% of the time    Medical Problem List and Plan: 1.  Decreased functional mobility secondary to right frontal hemorrhage, small SAH and signs of PRES  Cont CIR  PRAFO/WHO   Fluoxetine started 7/3 2.  DVT Prophylaxis/Anticoagulation: SCDs.   Vascular study neg 3. Pain Management: Tylenol as needed 4. Mood: Provide emotional support 5. Neuropsych: This patient is not fully capable of making decisions on her own behalf. 6. Skin/Wound Care: Routine skin checks 7. Fluids/Electrolytes/Nutrition: Routine I&Os 8. Seizure prophylaxis. Keppra 500 mg every 12 hours 9. ESRD with history of right renal cancer status post nephrectomy. Continue peritoneal dialysis as per renal services 10. Hypertension.  Norvasc 5 mg twice daily, Coreg 12.5 mg twice a day, Cozaar 50 mg daily  Monitor with increased mobility  Controlled 7/6 11. Hypothyroidism. Synthroid 12.Constipation. Laxative assistance 13. Hyperglycemia  Likely stress induced  Relatively controlled 7/6 14. Leukocytosis: Resolved  WBCs 9.1 on 7/5  Cont to monitor  UA/Ucx  remains pending 7/6  Labs pending  LOS (Days) 4 A FACE TO FACE EVALUATION WAS PERFORMED  Dontrae Morini [name] 01/08/2017 7:56 AM

## 2017-01-08 NOTE — Progress Notes (Signed)
Occupational Therapy Session Note  Patient Details  Name: Terry Juarez MRN: [id number] Date of Birth: [birth year]  Today's Date: 01/08/2017 OT Individual Time: 1045-1130 OT Individual Time Calculation (min): 45 min    Short Term Goals: Week 1:  OT Short Term Goal 1 (Week 1): Pt will be able to don a shirt with min A. OT Short Term Goal 2 (Week 1): Pt will demonstrate improved sitting balance by sitting statically on toilet with close S. OT Short Term Goal 3 (Week 1): Pt will complete squat pivot transfers on/off the toilet with min -mod A. OT Short Term Goal 4 (Week 1): Pt will demonstrate proficiency with self ROM of LUE with min cues or less. OT Short Term Goal 5 (Week 1): Pt will demonstrate improved LUE body awareness by using R hand to position L arm prior to transfers.  Skilled Therapeutic Interventions/Progress Updates:    1;1. No c/o pain. Pt request to void urine. Pt stand pivot transfer with MOD-MAX A using grab bar and Vc for safety awareness, sequencing, and weight shifting. Pt completes clothing management and pt able to complete hygiene seated on toilet with min guard assist. Pt washes at sink at sit to stand level with MOD A for balance/knee blocking while washing peri area and A to wash RUE and back. Pt able to cross legs to reach B feet. Pt requires min VC to recall hemi techniques for UB dressing. Pt threads BLE into underwear/pants and standing assistance as stated above and A to pull underwear over buttocks. Pt brushes teeth in standing with MOD A for balance. Pt stand pivot transfer to R with MOD A for lifting and pivot. Exited session with pt semi reclined in bed with call light in reach and all needs met.  Therapy Documentation Precautions:  Precautions Precautions: Fall Precaution Comments: Peritoneal dialysis patient; L subluxed shoulder Restrictions Weight Bearing Restrictions: No  See Function Navigator for Current Functional Status.   Therapy/Group:  Individual Therapy  [name] 01/08/2017, 11:39 AM

## 2017-01-08 NOTE — Progress Notes (Signed)
[facility] Progress Note   Subjective:    No machine issues last night and she was very happy about that "Hopes" she is making progress Says will be here until the end of the month  Objective Vitals:   01/06/17 1845 01/07/17 0644 01/07/17 1430 01/08/17 0535  BP: 116/75 103/60 116/72 125/82  Pulse: 72 70 68 72  Resp: 18 18 18 14   Temp: 98.9 F (37.2 C) 98.5 F (36.9 C) 98.1 F (36.7 C) 98.7 F (37.1 C)  TempSrc: Oral Oral Oral Oral  SpO2: 96% 96% 98% 98%  Weight: 49.8 kg (109 lb 12.6 oz) 52.2 kg (115 lb)  51.7 kg (113 lb 15.7 oz)  Height:       Physical Exam General: slender AAF NAD Pleasant, good spirits Regular S1S2 No Sr Lungs clear Abdomen: soft NT No LE edema Left hemiparesis Dialysis Access:  PD cath  right AVGG + whistling bruit    Recent Labs Lab 01/04/17 0238 01/04/17 2148 01/07/17 0630  NA 136 135 133*  K 3.7 4.1 3.7  CL 96* 94* 95*  CO2 27 25 23   GLUCOSE 112* 117* 129*  BUN 55* 55* 62*  CREATININE 12.59* 12.13* 12.34*  CALCIUM 10.5* 10.9* 8.2*  PHOS  --  4.0 4.6     Recent Labs Lab 01/04/17 2148 01/07/17 0630  ALBUMIN 3.0* 2.7*     Recent Labs Lab 01/04/17 2148 01/07/17 0630 01/08/17 0612  WBC 14.3* 9.1 8.3  HGB 17.2* 15.5* 15.4*  HCT 54.3* 49.7* 49.1*  MCV 103.0* 101.2* 101.0*  PLT 313 290 262    Lab Results  Component Value Date   INR 0.93 12/21/2016   INR 1.88 12/19/2016   INR 0.97 12/19/2016    Medications: . sodium chloride    . sodium chloride     . carvedilol  12.5 mg Oral BID WC  . cinacalcet  60 mg Oral Q supper  . famotidine  20 mg Oral Daily  . feeding supplement (NEPRO CARB STEADY)  237 mL Oral BID BM  . FLUoxetine  10 mg Oral Daily  . levETIRAcetam  500 mg Oral BID  . levothyroxine  88 mcg Oral QAC breakfast  . multivitamin  1 tablet Oral Daily  . pantoprazole  40 mg Oral BID  . polyethylene glycol  17 g Oral Daily  . senna-docusate  1 tablet Oral BID   Dialysis:PD 5 exchanges  overnight, no day bag, no pause, 2000 volume, using all 1.5%- UF of 1.5 liters   Summary: Pt is a 51 y.o.yo femalmalewith ESRD on PD who was admitted on 6/16/2018with sz/hemiparesis- found to have right sided frontal lobe hemorrhage, alsowith PRES. Seizures controlled in [place]. ? / had cardiac arrest 12/25/16 in Inpt HD. Transferred to Rehab 01/04/17.  Problem/Plan:  1. Intracranial CVA/ hemorrhage- with L hemiparesis. Did not require surgery. PRES was also present. Was making some clinical recovery then had cardiac arrest at inpt dialysis on 12/25/17 therefore transitioned back to PD- now making progress again.  2. ESRD- tolerating [facility] hemodialysis was complicated by arrest so she is now back on PD. Pt refusing return to HD. Per notes her family is willing to take overhome PD for pt. 7/2 K 4.1 They live in [place] and I believe they are actively trying to make arrangements for transition to another PD center closer to their home. Our home training unit had previously given info to CM prior to CIR transfer about who to contact for this - I can't  find record of any communication.  but not sure any progress made since. We will try to investigate status.  3. Volume/ HTN - Euvolemic now- changed to 1.5% 7/3. Losartan and amlodipine stopped d/t low BP's and good now just on low dose carvedilol.  All 1.5 % dialysate for tonight, no pause, no daytime dwell.  Check labs in the AM.  4. Cardiac arrest - while on HD 6/22. Refuses to ever consider HD again.  5. Anemia- history of GIB and low hgb. Responded toowell to ESA and and Hb now too high. Thisis part of reason for hypertension. Holding ESA now.  6. MBD -hypercalcemia Ca 10.9  On 7/2 down to 8.2 today P 4.6 - stopped calcium acetate- add back at a lower dose later if P goes up or add Fe based binder, VDRA stopped 7/2 2/2 hypercalcemia; on sensipar 60 daily; renal panel and PTH in AM 7. Nutrition Alb 3 - regular diet ok, has Nepro and vitamins    Jamal Maes, MD [facility] [phone] Pager 01/08/2017, 12:24 PM

## 2017-01-08 NOTE — Progress Notes (Signed)
Physical Therapy Session Note  Patient Details  Name: Terry Juarez MRN: [id number] Date of Birth: [birth year]  Today's Date: 01/08/2017 PT Individual Time: 1445-1530 PT Individual Time Calculation (min): 45 min   Short Term Goals: Week 1:  PT Short Term Goal 1 (Week 1): Pt will transfer from bed to w/c with moderate assist in either direction  PT Short Term Goal 2 (Week 1): Pt will perform all bed mobility with min assist PT Short Term Goal 3 (Week 1): Pt will demonstrate dynamic standing balance for functional tasks requiring moderate assistance PT Short Term Goal 4 (Week 1): Pt will propel w/c 50 ft in a controlled environment with supervision   Skilled Therapeutic Interventions/Progress Updates:    Pt supine in bed upon PT arrival, agreeable to therapy tx. Pt transferred from supine to sitting EOB with moderate assist to help move L LE and for sitting balance. Pt transferred to her R, squat pivot transfer with mod assist, verbal cues for sequencing and technique. Pt propelled w/c to gym x 150 ft with supervision using R UE and R LE. Pt transferred from w/c to mat with mod assist, squat pivot transfer. Sitting edge of mat, worked on static sitting balance and posture x 5 min without UE support and with min assist for occasional LOB to the L. Worked on sit to stands x 3 with Max assist, facilitation for anterior weight shift, facilitation to L glutes and blocking L knee. Worked on standing with table in front in order to prop on elbow for weightbearing through UEs, max assist to maintain standing balance. Pt reported that her L arm has been hurting much worse today 8/10 since she fell in the bathroom earlier in the day (already documented fall) and she thinks she hit her arm. RN and PA made aware of pts new onset of pain. Pt requested to take a break from therapy due to pain and RN stated she would find out about pain meds. Pt left sitting in w/c with call bell in reach.   Therapy  Documentation Precautions:  Precautions Precautions: Fall Precaution Comments: Peritoneal dialysis patient; L subluxed shoulder Restrictions Weight Bearing Restrictions: No General: PT Amount of Missed Time (min): 15 Minutes PT Missed Treatment Reason: Pain   See Function Navigator for Current Functional Status.   Therapy/Group: Individual Therapy  Netta Corrigan, PT, DPT 01/08/2017, 4:45 PM

## 2017-01-08 NOTE — Progress Notes (Signed)
Speech Language Pathology Daily Session Note  Patient Details  Name: Terry Juarez MRN: [id number] Date of Birth: 02-22-1966  Today's Date: 01/08/2017 SLP Individual Time: 0930-1030 SLP Individual Time Calculation (min): 60 min  Short Term Goals: Week 1: SLP Short Term Goal 1 (Week 1): Patient will demonstrate functional problem solving for basic and familiar tasks with Min A verbal cues.  SLP Short Term Goal 2 (Week 1): Patient will recall new, daily information with Min A verbal cues and use of external aids.  SLP Short Term Goal 3 (Week 1): Patient will self-monitor and correct errors during functional tasks with Min A verbal cues.  SLP Short Term Goal 4 (Week 1): Patient will demonstrate selective attention in a mildly distracting enviornment to functional tasks for 30 minutes with Min A verbal cues for redirection.  SLP Short Term Goal 5 (Week 1): Patient will attend to left field of enviornment during functional tasks with Min A verbal cues.   Skilled Therapeutic Interventions: Skilled treatment session focused on cognitive goals. SLP facilitated session by providing Min A verbal cues for sequencing and problem solving transfer from bed to wheelchair. SLP also provided intermittent supervision verbal cues for problem solving during a novel, complex task. Patient left upright in wheelchair with all needs within reach. Continue with current plan of care.      Function:   Cognition Comprehension Comprehension assist level: Follows complex conversation/direction with extra time/assistive device  Expression   Expression assist level: Expresses complex 90% of the time/cues < 10% of the time  Social Interaction Social Interaction assist level: Interacts appropriately 90% of the time - Needs monitoring or encouragement for participation or interaction.  Problem Solving Problem solving assist level: Solves basic 90% of the time/requires cueing < 10% of the time  Memory Memory assist level:  Recognizes or recalls 90% of the time/requires cueing < 10% of the time    Pain No/Denies Pain   Therapy/Group: Individual Therapy  Donatello Kleve 01/08/2017, 3:45 PM

## 2017-01-09 ENCOUNTER — Inpatient Hospital Stay (HOSPITAL_COMMUNITY): Payer: BLUE CROSS/BLUE SHIELD | Admitting: Speech Pathology

## 2017-01-09 ENCOUNTER — Inpatient Hospital Stay (HOSPITAL_COMMUNITY): Payer: BLUE CROSS/BLUE SHIELD | Admitting: Occupational Therapy

## 2017-01-09 ENCOUNTER — Inpatient Hospital Stay (HOSPITAL_COMMUNITY): Payer: BLUE CROSS/BLUE SHIELD | Admitting: Physical Therapy

## 2017-01-09 DIAGNOSIS — D72828 Other elevated white blood cell count: Secondary | ICD-10-CM

## 2017-01-09 LAB — URINALYSIS, COMPLETE (UACMP) WITH MICROSCOPIC
Bilirubin Urine: NEGATIVE
Glucose, UA: NEGATIVE mg/dL
KETONES UR: NEGATIVE mg/dL
Nitrite: NEGATIVE
PH: 5 (ref 5.0–8.0)
Protein, ur: 100 mg/dL — AB
Specific Gravity, Urine: 1.016 (ref 1.005–1.030)

## 2017-01-09 LAB — RENAL FUNCTION PANEL
ALBUMIN: 2.7 g/dL — AB (ref 3.5–5.0)
ANION GAP: 14 (ref 5–15)
BUN: 62 mg/dL — AB (ref 6–20)
CHLORIDE: 96 mmol/L — AB (ref 101–111)
CO2: 24 mmol/L (ref 22–32)
Calcium: 7.2 mg/dL — ABNORMAL LOW (ref 8.9–10.3)
Creatinine, Ser: 12.87 mg/dL — ABNORMAL HIGH (ref 0.44–1.00)
GFR, EST AFRICAN AMERICAN: 3 mL/min — AB (ref >60)
GFR, EST NON AFRICAN AMERICAN: 3 mL/min — AB (ref >60)
Glucose, Bld: 110 mg/dL — ABNORMAL HIGH (ref 65–99)
PHOSPHORUS: 5 mg/dL — AB (ref 2.5–4.6)
POTASSIUM: 3.6 mmol/L (ref 3.5–5.1)
Sodium: 134 mmol/L — ABNORMAL LOW (ref 135–145)

## 2017-01-09 MED ORDER — CALCIUM ACETATE (PHOS BINDER) 667 MG PO CAPS
667.0000 mg | ORAL_CAPSULE | Freq: Three times a day (TID) | ORAL | Status: DC
  Administered 2017-01-09 – 2017-01-24 (×44): 667 mg via ORAL
  Filled 2017-01-09 (×45): qty 1

## 2017-01-09 NOTE — Progress Notes (Signed)
Patient w/o acute distress or discomfort throughout shift, VSS, no residue c/o pain or discomfort from prior fall. Continue to closely monitor. Continue Perineal Dialysis unremarkable.

## 2017-01-09 NOTE — Progress Notes (Signed)
[facility]IL[facility]     PROGRESS NOTE  Subjective/Complaints:  Lying in bed when I entered. Denied pain.   ROS: pt denies nausea, vomiting, diarrhea, cough, shortness of breath or chest pain   Objective: Vital Signs: Blood pressure 139/79, pulse 65, temperature 97.9 F (36.6 C), temperature source Oral, resp. rate 17, height 5\' 5"  (1.651 m), weight 50.2 kg (110 lb 10.7 oz), SpO2 98 %. Dg Humerus Left  Result Date: 01/08/2017 CLINICAL DATA:  Fall landing on left side.  Left shoulder pain. EXAM: LEFT HUMERUS - 2+ VIEW COMPARISON:  None. FINDINGS: There is no evidence of fracture or other focal bone lesions. Soft tissues are unremarkable. IMPRESSION: Negative. Electronically Signed   By: Van Clines M.D.   On: 01/08/2017 18:11    Recent Labs  01/07/17 0630 01/08/17 0612  WBC 9.1 8.3  HGB 15.5* 15.4*  HCT 49.7* 49.1*  PLT 290 262    Recent Labs  01/07/17 0630 01/09/17 0447  NA 133* 134*  K 3.7 3.6  CL 95* 96*  GLUCOSE 129* 110*  BUN 62* 62*  CREATININE 12.34* 12.87*  CALCIUM 8.2* 7.2*   CBG (last 3)  No results for input(s): GLUCAP in the last 72 hours.  Wt Readings from Last 3 Encounters:  01/09/17 50.2 kg (110 lb 10.7 oz)  01/04/17 50.7 kg (111 lb 12.4 oz)  09/04/16 54.4 kg (120 lb)    Physical Exam:  BP 139/79 (BP Location: Left Wrist)   Pulse 65   Temp 97.9 F (36.6 C) (Oral)   Resp 17   Ht 5\' 5"  (1.651 m)   Wt 50.2 kg (110 lb 10.7 oz)   SpO2 98%   BMI 18.42 kg/m  Constitutional: She appears well-developed. Frail  HENT: Normocephalic and atraumatic.  Eyes: EOMI. No discharge.  Cardiovascular: RRR without murmur. No JVD . Respiratory: CTA Bilaterally without wheezes or rales. Normal effort .  GI: Soft. Bowel sounds are normal.   Musculoskeletal: She exhibits no edema or tenderness.  Neurological: She is alert.  Fair awareness of deficits.  Motor: RUE/RLE: 5/5 LUE/LLE: 0/5 No increase in tone Skin: Skin is warm and  dry.  Psychiatric: Her behavior is normal. Her affect is blunt. She does not engage much   Assessment/Plan: 1. Functional deficits secondary to right frontal hemorrhage, small SAH and signs of PRES which require 3+ hours per day of interdisciplinary therapy in a comprehensive inpatient rehab setting. Physiatrist is providing close team supervision and 24 hour management of active medical problems listed below. Physiatrist and rehab team continue to assess barriers to discharge/monitor patient progress toward functional and medical goals.  Function:  Bathing Bathing position   Position: Wheelchair/chair at sink  Bathing parts Body parts bathed by patient: Chest, Abdomen, Front perineal area, Right upper leg, Left upper leg, Right lower leg, Left arm, Left lower leg, Buttocks Body parts bathed by helper: Right arm, Back  Bathing assist Assist Level: Touching or steadying assistance(Pt > 75%)      Upper Body Dressing/Undressing Upper body dressing   What is the patient wearing?: Pull over shirt/dress     Pull over shirt/dress - Perfomed by patient: Thread/unthread right sleeve, Thread/unthread left sleeve, Put head through opening, Pull shirt over trunk Pull over shirt/dress - Perfomed by helper: Pull shirt over trunk, Thread/unthread left sleeve        Upper body assist Assist Level: Touching or steadying assistance(Pt > 75%)      Lower Body Dressing/Undressing Lower body  dressing   What is the patient wearing?: Underwear, Pants Underwear - Performed by patient: Thread/unthread right underwear leg, Thread/unthread left underwear leg Underwear - Performed by helper: Pull underwear up/down Pants- Performed by patient: Thread/unthread right pants leg, Thread/unthread left pants leg Pants- Performed by helper: Pull pants up/down   Non-skid slipper socks- Performed by helper: Don/doff right sock, Don/doff left sock Socks - Performed by patient: Don/doff right sock, Don/doff left  sock     Shoes - Performed by helper: Don/doff right shoe, Don/doff left shoe, Fasten right, Fasten left          Lower body assist Assist for lower body dressing: Touching or steadying assistance (Pt > 75%)      Toileting Toileting   Toileting steps completed by patient: Performs perineal hygiene Toileting steps completed by helper: Adjust clothing prior to toileting, Adjust clothing after toileting    Toileting assist Assist level: Touching or steadying assistance (Pt.75%)   Transfers Chair/bed transfer   Chair/bed transfer method: Squat pivot Chair/bed transfer assist level: Moderate assist (Pt 50 - 74%/lift or lower) Chair/bed transfer assistive device: Armrests     Locomotion Ambulation     Max distance: 8 ft Assist level: 2 helpers   Wheelchair   Type: Manual Max wheelchair distance: 152 Assist Level: Touching or steadying assistance (Pt > 75%)  Cognition Comprehension Comprehension assist level: Follows complex conversation/direction with extra time/assistive device  Expression Expression assist level: Expresses complex 90% of the time/cues < 10% of the time  Social Interaction Social Interaction assist level: Interacts appropriately 90% of the time - Needs monitoring or encouragement for participation or interaction.  Problem Solving Problem solving assist level: Solves basic 90% of the time/requires cueing < 10% of the time  Memory Memory assist level: Recognizes or recalls 90% of the time/requires cueing < 10% of the time    Medical Problem List and Plan: 1.  Decreased functional mobility secondary to right frontal hemorrhage, small SAH and signs of PRES  Cont CIR  PRAFO/WHO   Fluoxetine started 7/3 for depression 2.  DVT Prophylaxis/Anticoagulation: SCDs.   Vascular study neg 3. Pain Management: Tylenol as needed 4. Mood: Provide emotional support 5. Neuropsych: This patient is not fully capable of making decisions on her own behalf. 6. Skin/Wound Care:  Routine skin checks 7. Fluids/Electrolytes/Nutrition: Routine I&Os 8. Seizure prophylaxis. Keppra 500 mg every 12 hours 9. ESRD with history of right renal cancer status post nephrectomy. Continue peritoneal dialysis as per renal services 10. Hypertension.  Norvasc 5 mg twice daily, Coreg 12.5 mg twice a day, Cozaar 50 mg daily  Monitor with increased mobility  Controlled 7/6 11. Hypothyroidism. Synthroid 12.Constipation. Laxative assistance 13. Hyperglycemia  Likely stress induced  Relatively controlled 7/6 14. Leukocytosis: Resolved  WBCs 8.3 7/6  Cont to monitor  UA/Ucx not collected because of difficulty collecting. Pt refuses cath for sample---hold for now.   Labs pending  LOS (Days) 5 A FACE TO FACE EVALUATION WAS PERFORMED  Onnie Hatchel T 01/09/2017 9:39 AM

## 2017-01-09 NOTE — Progress Notes (Signed)
Dr. Naaman Plummer on department verbal order obtained for I/O cath for urine specimen , patient refused , Dr. Naaman Plummer informed

## 2017-01-09 NOTE — Progress Notes (Signed)
Occupational Therapy Session Note  Patient Details  Name: Terry Juarez MRN: [id number] Date of Birth: May 14, 1966  Today's Date: 01/09/2017 OT Individual Time: 0258-5277 OT Individual Time Calculation (min): 100 min    Short Term Goals: Week 1:  OT Short Term Goal 1 (Week 1): Pt will be able to don a shirt with min A. OT Short Term Goal 2 (Week 1): Pt will demonstrate improved sitting balance by sitting statically on toilet with close S. OT Short Term Goal 3 (Week 1): Pt will complete squat pivot transfers on/off the toilet with min -mod A. OT Short Term Goal 4 (Week 1): Pt will demonstrate proficiency with self ROM of LUE with min cues or less. OT Short Term Goal 5 (Week 1): Pt will demonstrate improved LUE body awareness by using R hand to position L arm prior to transfers.  Skilled Therapeutic Interventions/Progress Updates: supine supine in bed with Mother in law sitting close by.    Session focus on follows:      Caregiver education as mtr-in-law observed the session (patient will have some supervision by mtr in law after discharge)   Supine to EOB= mod A due to left hemiplegia UE & LE;         EOB to right stronger side stand piviot transfer= Mod A       Bathing and dressing sitting in w/c at sink=overall moderate assistance, particularly to BLOCK LEFT KNEE DURING STANDING for bathing and dressing and to don/doff lower body dressing  This clinician provided some left hand over hand assistance to cross midline to wash right side of body  At end of session, patient was left seated and positioninallly belted (instructed patient and mtr in law on how to disengage positional safety belt) with call bell, phone and mtr in law nearby     Therapy Documentation Precautions:  Precautions Precautions: Fall Precaution Comments: Peritoneal dialysis patient; L subluxed shoulder Restrictions Weight Bearing Restrictions: No    Pain:denied   ADL: ADL ADL Comments: refer to functional  navigator Vision   Other Treatments:    See Function Navigator for Current Functional Status.   Therapy/Group: Individual Therapy  Alfredia Ferguson [facility] 01/09/2017, 2:05 PM

## 2017-01-09 NOTE — Progress Notes (Signed)
Physical Therapy Session Note  Patient Details  Name: Terry Juarez MRN: [id number] Date of Birth: [birth year]  Today's Date: 01/09/2017 PT Individual Time: 1300-1400 PT Individual Time Calculation (min): 60 min   Short Term Goals: Week 1:  PT Short Term Goal 1 (Week 1): Pt will transfer from bed to w/c with moderate assist in either direction  PT Short Term Goal 2 (Week 1): Pt will perform all bed mobility with min assist PT Short Term Goal 3 (Week 1): Pt will demonstrate dynamic standing balance for functional tasks requiring moderate assistance PT Short Term Goal 4 (Week 1): Pt will propel w/c 50 ft in a controlled environment with supervision   Skilled Therapeutic Interventions/Progress Updates:    no c/o of pain. PT treatment session focused on neuromuscular re-education of the L LE during functional tasks.  Pt supine in bed upon arrival and agreeable to PT treatment session. Pt supine to sit using bed rails and HOB elevated with mod assist for L LE lifting to edge of bed. Pt performs stand pivot transfers bed to w/c to mat toward the L with mod assist. Pt performs w/c mobility using R UE and LE to and from the gym with supervision. Pt performs R LE step ups (2inch step) 4 sets to fatigue with mod to max assist. PT provided max multimodal cues for muscle activation of L LE extensors and cues for upright trunk posture. Pt ambulated with hemiwalker in R UE 16f then 260fwith max assist and assistance with all components of swing and stance phases for L LE with noticed increased tone. Pt performed w/c to bed stand pivot transfer to the R with mod A and verbal and tactile cues for forward weight shifting. Pt required mod A for sit to supine for lifting L LE into the bed. Pt left supine in bed with call bell in place and needs met.   Therapy Documentation Precautions:  Precautions Precautions: Fall Precaution Comments: Peritoneal dialysis patient; L subluxed shoulder Restrictions Weight  Bearing Restrictions: No   See Function Navigator for Current Functional Status.   Therapy/Group: Individual Therapy  Aundra Espin 01/09/2017, 4:06 PM

## 2017-01-09 NOTE — Progress Notes (Signed)
[facility] Progress Note   Subjective:    No PD issues, no alarms during the night Using all 1.5% and she is fine with that for now Put her shirt on by herself while I was in Sitting at the sink in her wheelchair Mother in law in to see her.   Objective Vitals:   01/08/17 2200 01/09/17 0100 01/09/17 0555 01/09/17 1028  BP: 127/75 133/78 139/79 (!) 152/98  Pulse: 66 64 65 70  Resp: 17 16 17 17   Temp: 98.7 F (37.1 C) 98.2 F (36.8 C) 97.9 F (36.6 C) 98.7 F (37.1 C)  TempSrc: Oral Oral Oral Oral  SpO2: 97% 98% 98% 100%  Weight:   50.2 kg (110 lb 10.7 oz)   Height:       Physical Exam General: slender AAF NAD sitting in wheelchair working dressing self Pleasant, good spirits Regular S1S2 No Sr Lungs clear Abdomen: soft NT No LE edema Left hemiparesis, unable to use left side at all. PD cath with clear dry dressing R abdomen Right upper arm AVF aneurysmal/+ bruit   Recent Labs Lab 01/04/17 2148 01/07/17 0630 01/09/17 0447  NA 135 133* 134*  K 4.1 3.7 3.6  CL 94* 95* 96*  CO2 25 23 24   GLUCOSE 117* 129* 110*  BUN 55* 62* 62*  CREATININE 12.13* 12.34* 12.87*  CALCIUM 10.9* 8.2* 7.2*  PHOS 4.0 4.6 5.0*     Recent Labs Lab 01/04/17 2148 01/07/17 0630 01/09/17 0447  ALBUMIN 3.0* 2.7* 2.7*     Recent Labs Lab 01/04/17 2148 01/07/17 0630 01/08/17 0612  WBC 14.3* 9.1 8.3  HGB 17.2* 15.5* 15.4*  HCT 54.3* 49.7* 49.1*  MCV 103.0* 101.2* 101.0*  PLT 313 290 262    Lab Results  Component Value Date   INR 0.93 12/21/2016   INR 1.88 12/19/2016   INR 0.97 12/19/2016    Medications: . sodium chloride    . sodium chloride     . carvedilol  12.5 mg Oral BID WC  . cinacalcet  60 mg Oral Q supper  . famotidine  20 mg Oral Daily  . feeding supplement (NEPRO CARB STEADY)  237 mL Oral BID BM  . FLUoxetine  10 mg Oral Daily  . levETIRAcetam  500 mg Oral BID  . levothyroxine  88 mcg Oral QAC breakfast  . multivitamin  1 tablet Oral  Daily  . pantoprazole  40 mg Oral BID  . polyethylene glycol  17 g Oral Daily  . senna-docusate  1 tablet Oral BID   Dialysis:PD 5 exchanges overnight, no day bag, no pause, 2000 volume, using all 1.5%- UF of 1.5 liters   Summary: Pt is a 51 y.o.yo femalewith ESRD on PD who was admitted on 6/16/2018with sz/hemiparesis- found to have right sided frontal lobe hemorrhage, alsowith PRES. Seizures controlled in [place]. ? / had cardiac arrest 12/25/16 in Inpt HD. Transferred to Rehab 01/04/17.  Problem/Plan:  1. Intracranial CVA/ small SAH R frontal, + PRES with L hemiparesis. Was making some clinical recovery then had cardiac arrest at inpt dialysis on 12/25/17 therefore transitioned back to PD- now making progress again.  2. ESRD- Continue CCPD (hemodialysis was complicated by arrest so she is now back on PD and refuses any further HD.  Per notes her family is willing to take overhome PD for pt. They live in [place] and I believe they are actively trying to make arrangements for transition to another PD center closer to their home. Our home  training unit had previously given info to CM prior to CIR transfer about who to contact for this - I can't find record of any communication, but not sure any progress made since. I have asked Bobbi RN at our Home Therapies unit (on 7/6) to look into this  3. HTN - Euvolemic now- changed PD solutions to all 1.5% on 7/3. Losartan and amlodipine stopped d/t low BP's and now just on low dose carvedilol.  All 1.5 % dialysate for tonight, no pause, no daytime dwell.  Check labs in the AM. Follow BP  4. Cardiac arrest - while on HD 6/22. Refuses to ever consider HD again.   5. Anemia- history of GIB and low hgb. Responded toowell to ESA and and Hb now too high (>15). Holding ESA now.  6. MBD - Had hypercalcemia Ca 10.9 on 7/2.   Stopped calcium acetate - now Hypocalcemic (7.2) so resuming Ca acetate. On sensipar 60 daily; PTH pending from this  AM.  7. Nutrition Alb 2.7 - regular diet ok, has Nepro and vitamins    Jamal Maes, MD [place] Pager 01/09/2017, 10:47 AM

## 2017-01-09 NOTE — Progress Notes (Signed)
Speech Language Pathology Daily Session Note  Patient Details  Name: PASCUALA KLUTTS MRN: [id number] Date of Birth: 1965/10/14  Today's Date: 01/09/2017 SLP Individual Time: 3354-5625 SLP Individual Time Calculation (min): 45 min  Short Term Goals: Week 1: SLP Short Term Goal 1 (Week 1): Patient will demonstrate functional problem solving for basic and familiar tasks with Min A verbal cues.  SLP Short Term Goal 2 (Week 1): Patient will recall new, daily information with Min A verbal cues and use of external aids.  SLP Short Term Goal 3 (Week 1): Patient will self-monitor and correct errors during functional tasks with Min A verbal cues.  SLP Short Term Goal 4 (Week 1): Patient will demonstrate selective attention in a mildly distracting enviornment to functional tasks for 30 minutes with Min A verbal cues for redirection.  SLP Short Term Goal 5 (Week 1): Patient will attend to left field of enviornment during functional tasks with Min A verbal cues.   Skilled Therapeutic Interventions: Skilled treatment session focused on cognitive goals. SLP facilitated session with a mildly complex medication management task in which patient independently recalled her current medications with 60% accuracy and organized a two time per day pill box with Min A verbal cues to self-monitor and correct errors. Patient demonstrated divided attention between task and functional conversation with Mod I for ~25 minutes. Patient left upright in bed with all needs within reach. Continue with current plan of care.      Function:  Cognition Comprehension Comprehension assist level: Follows complex conversation/direction with extra time/assistive device  Expression   Expression assist level: Expresses complex 90% of the time/cues < 10% of the time  Social Interaction Social Interaction assist level: Interacts appropriately 90% of the time - Needs monitoring or encouragement for participation or interaction.  Problem  Solving Problem solving assist level: Solves basic 90% of the time/requires cueing < 10% of the time  Memory Memory assist level: Recognizes or recalls 90% of the time/requires cueing < 10% of the time    Pain No/Denies Pain   Therapy/Group: Individual Therapy  Orey Moure 01/09/2017, 2:56 PM

## 2017-01-10 LAB — RENAL FUNCTION PANEL
ALBUMIN: 2.7 g/dL — AB (ref 3.5–5.0)
Anion gap: 16 — ABNORMAL HIGH (ref 5–15)
BUN: 61 mg/dL — ABNORMAL HIGH (ref 6–20)
CO2: 24 mmol/L (ref 22–32)
Calcium: 7.2 mg/dL — ABNORMAL LOW (ref 8.9–10.3)
Chloride: 95 mmol/L — ABNORMAL LOW (ref 101–111)
Creatinine, Ser: 13.27 mg/dL — ABNORMAL HIGH (ref 0.44–1.00)
GFR calc Af Amer: 3 mL/min — ABNORMAL LOW (ref >60)
GFR calc non Af Amer: 3 mL/min — ABNORMAL LOW (ref >60)
GLUCOSE: 102 mg/dL — AB (ref 65–99)
PHOSPHORUS: 5.1 mg/dL — AB (ref 2.5–4.6)
Potassium: 3.8 mmol/L (ref 3.5–5.1)
SODIUM: 135 mmol/L (ref 135–145)

## 2017-01-10 LAB — URINE CULTURE: Culture: NO GROWTH

## 2017-01-10 LAB — PARATHYROID HORMONE, INTACT (NO CA): PTH: 112 pg/mL — AB (ref 15–65)

## 2017-01-10 NOTE — Progress Notes (Signed)
Pt requesting grounds pass with family. Also sling for LUE, reports was ordered before coming to rehab, unable to locate order or sling.

## 2017-01-10 NOTE — Progress Notes (Signed)
[facility] Progress Note   Subjective:    No PD issues or alarms last 3 nights (she's very happy about that) Using all 1.5% dialysate past 3 nights Feels well, no SOB or swelling Says "no PT for today - hoping for some visitors"    Objective Vitals:   01/09/17 0555 01/09/17 1028 01/09/17 1416 01/10/17 0543  BP: 139/79 (!) 152/98 133/82 (!) 146/83  Pulse: 65 70 66 63  Resp: 17 17 16 18   Temp: 97.9 F (36.6 C) 98.7 F (37.1 C) 97.8 F (36.6 C) 98.2 F (36.8 C)  TempSrc: Oral Oral Oral Oral  SpO2: 98% 100% 98% 97%  Weight: 50.2 kg (110 lb 10.7 oz)   51.7 kg (113 lb 15.7 oz)  Height:       Physical Exam General: slender AAF NAD  I woke her up from a nap, lying on left side in bed Good spirits Regular S1S2 No S3 Lungs clear Abdomen: soft NT No LE edema Left hemiparesis. PD cath with clear dry dressing R abdomen Right upper arm AVF aneurysmal/+ bruit   Recent Labs Lab 01/07/17 0630 01/09/17 0447 01/10/17 0524  NA 133* 134* 135  K 3.7 3.6 3.8  CL 95* 96* 95*  CO2 23 24 24   GLUCOSE 185* 631* 102*  BUN 62* 62* 61*  CREATININE 12.34* 12.87* 13.27*  CALCIUM 8.2* 7.2* 7.2*  PHOS 4.6 5.0* 5.1*     Recent Labs Lab 01/07/17 0630 01/09/17 0447 01/10/17 0524  ALBUMIN 2.7* 2.7* 2.7*     Recent Labs Lab 01/04/17 2148 01/07/17 0630 01/08/17 0612  WBC 14.3* 9.1 8.3  HGB 17.2* 15.5* 15.4*  HCT 54.3* 49.7* 49.1*  MCV 103.0* 101.2* 101.0*  PLT 313 290 262    Lab Results  Component Value Date   INR 0.93 12/21/2016   INR 1.88 12/19/2016   INR 0.97 12/19/2016    Medications: . sodium chloride    . sodium chloride     . calcium acetate  667 mg Oral TID WC  . carvedilol  12.5 mg Oral BID WC  . cinacalcet  60 mg Oral Q supper  . famotidine  20 mg Oral Daily  . feeding supplement (NEPRO CARB STEADY)  237 mL Oral BID BM  . FLUoxetine  10 mg Oral Daily  . levETIRAcetam  500 mg Oral BID  . levothyroxine  88 mcg Oral QAC breakfast  .  multivitamin  1 tablet Oral Daily  . pantoprazole  40 mg Oral BID  . polyethylene glycol  17 g Oral Daily  . senna-docusate  1 tablet Oral BID   Dialysis:PD 5 exchanges overnight, no day bag, no pause, 2000 volume, using all 1.5%- UF of 1.5 liters   Summary: Pt is a 51 y.o.yo femalewith ESRD on PD who was admitted on 6/16/2018with sz/hemiparesis- found to have right sided frontal lobe hemorrhage, alsowith PRES. Seizures controlled in [place]. ? / had cardiac arrest 12/25/16 in Inpt HD. Transferred to Rehab 01/04/17.  Problem/Plan:  1. Intracranial CVA/ small SAH R frontal, + PRES with L hemiparesis. Was making some clinical recovery then had cardiac arrest at inpt dialysis on 12/26/50 therefore transitioned back to PD- now making progress again.  2. ESRD- Continue CCPD (hemodialysis was complicated by arrest so she is now back on PD and refuses any further HD.  Per notes/patient her family is willing to take overhome PD for pt. They live in [place] and I believe they are actively trying to make arrangements for transition  to another PD center closer to their home. Our home training unit had previously given info to CM prior to CIR transfer about who to contact for this - I can't find record of any communication, but not sure any progress made since. I have asked Bobbi RN at our Home Therapies unit (on 7/6) to look into this  3. HTN/volume - PD solutions all 1.5% since 7/3. Losartan and amlodipine were stopped d/t low BP's and now just on low dose carvedilol.  Weight got as low as 47.2 and she says felt very sluggish with that. Post weight today up to 51.7 (says outpt 52 or 53). No edema, flat neck veins so I still think volume wise OK. Use all 1.5 % dialysate again for tonight. If weight drifts up any further tomorrow will use half/half 1.5/2.5 tomorrow.   4. Cardiac arrest - while on HD 6/22. Refuses to ever consider HD again.   5. Anemia- history of GIB and low hgb. Responded toowell  to ESA and and Hb now too high (>15). Holding ESA now.  6. MBD - Had hypercalcemia Ca 10.9 on 7/2.   Stopped calcium acetate - now Hypocalcemic (7.2) so resumed Ca acetate. On sensipar 60 daily; PTH still pending from 7/7.  7. Nutrition Alb 2.7 - regular diet ok, has Nepro and vitamins    Jamal Maes, MD [place] Pager 01/10/2017, 10:53 AM

## 2017-01-10 NOTE — Progress Notes (Signed)
[name]     PROGRESS NOTE  Subjective/Complaints:  Lying in bed. States she's comfortable.   ROS: Limited due to cognitive/behavioral   Objective: Vital Signs: Blood pressure (!) 146/83, pulse 63, temperature 98.2 F (36.8 C), temperature source Oral, resp. rate 18, height 5\' 5"  (1.651 m), weight 51.7 kg (113 lb 15.7 oz), SpO2 97 %. Dg Humerus Left  Result Date: 01/08/2017 CLINICAL DATA:  Fall landing on left side.  Left shoulder pain. EXAM: LEFT HUMERUS - 2+ VIEW COMPARISON:  None. FINDINGS: There is no evidence of fracture or other focal bone lesions. Soft tissues are unremarkable. IMPRESSION: Negative. Electronically Signed   By: Van Clines M.D.   On: 01/08/2017 18:11    Recent Labs  01/08/17 0612  WBC 8.3  HGB 15.4*  HCT 49.1*  PLT 262    Recent Labs  01/09/17 0447 01/10/17 0524  NA 134* 135  K 3.6 3.8  CL 96* 95*  GLUCOSE 110* 102*  BUN 62* 61*  CREATININE 12.87* 13.27*  CALCIUM 7.2* 7.2*   CBG (last 3)  No results for input(s): GLUCAP in the last 72 hours.  Wt Readings from Last 3 Encounters:  01/10/17 51.7 kg (113 lb 15.7 oz)  01/04/17 50.7 kg (111 lb 12.4 oz)  09/04/16 54.4 kg (120 lb)    Physical Exam:  BP (!) 146/83 (BP Location: Left Arm)   Pulse 63   Temp 98.2 F (36.8 C) (Oral)   Resp 18   Ht 5\' 5"  (1.651 m)   Wt 51.7 kg (113 lb 15.7 oz)   SpO2 97%   BMI 18.97 kg/m  Constitutional: She appears well-developed. Frail  HENT: Normocephalic and atraumatic.  Eyes: EOMI. No discharge.  Cardiovascular: .RRR without murmur. No JVD  . Respiratory: CTA Bilaterally without wheezes or rales. Normal effort .  GI: Soft. Bowel sounds are normal.   Musculoskeletal: She exhibits no edema or tenderness.  Neurological: She is alert.  Fair awareness of deficits.  Motor: RUE/RLE: 5/5 LUE/LLE: 0/5 No increase in tone Skin: Skin is warm and dry.  Psychiatric: Her behavior is normal. Her affect is blunt. She  does not engage much   Assessment/Plan: 1. Functional deficits secondary to right frontal hemorrhage, small SAH and signs of PRES which require 3+ hours per day of interdisciplinary therapy in a comprehensive inpatient rehab setting. Physiatrist is providing close team supervision and 24 hour management of active medical problems listed below. Physiatrist and rehab team continue to assess barriers to discharge/monitor patient progress toward functional and medical goals.  Function:  Bathing Bathing position   Position: Wheelchair/chair at sink  Bathing parts Body parts bathed by patient: Left arm, Chest, Abdomen, Front perineal area, Right upper leg, Left upper leg Body parts bathed by helper: Buttocks, Left lower leg, Back, Right lower leg, Right arm  Bathing assist Assist Level: Touching or steadying assistance(Pt > 75%)      Upper Body Dressing/Undressing Upper body dressing   What is the patient wearing?: Pull over shirt/dress     Pull over shirt/dress - Perfomed by patient: Thread/unthread right sleeve, Thread/unthread left sleeve, Put head through opening Pull over shirt/dress - Perfomed by helper: Pull shirt over trunk        Upper body assist Assist Level: Touching or steadying assistance(Pt > 75%)      Lower Body Dressing/Undressing Lower body dressing   What is the patient wearing?: Underwear, Pants Underwear - Performed by patient: Thread/unthread right underwear leg Underwear -  Performed by helper: Thread/unthread left underwear leg, Pull underwear up/down Pants- Performed by patient: Thread/unthread right pants leg Pants- Performed by helper: Thread/unthread left pants leg, Pull pants up/down, Fasten/unfasten pants   Non-skid slipper socks- Performed by helper: Don/doff right sock, Don/doff left sock Socks - Performed by patient: Don/doff right sock Socks - Performed by helper: Don/doff left sock Shoes - Performed by patient: Don/doff left shoe, Fasten right,  Don/doff right shoe Shoes - Performed by helper: Don/doff left shoe, Fasten right, Don/doff right shoe          Lower body assist Assist for lower body dressing: Touching or steadying assistance (Pt > 75%)      Toileting Toileting   Toileting steps completed by patient: Performs perineal hygiene Toileting steps completed by helper: Adjust clothing prior to toileting, Performs perineal hygiene, Adjust clothing after toileting Toileting Assistive Devices: Grab bar or rail  Toileting assist Assist level: Touching or steadying assistance (Pt.75%)   Transfers Chair/bed transfer   Chair/bed transfer method: Squat pivot Chair/bed transfer assist level: Moderate assist (Pt 50 - 74%/lift or lower) Chair/bed transfer assistive device: Armrests     Locomotion Ambulation     Max distance: 8 ft Assist level: 2 helpers   Wheelchair   Type: Manual Max wheelchair distance: 152 Assist Level: Touching or steadying assistance (Pt > 75%)  Cognition Comprehension Comprehension assist level: Understands basic 90% of the time/cues < 10% of the time  Expression Expression assist level: Expresses basic 75 - 89% of the time/requires cueing 10 - 24% of the time. Needs helper to occlude trach/needs to repeat words.  Social Interaction Social Interaction assist level: Interacts appropriately 90% of the time - Needs monitoring or encouragement for participation or interaction.  Problem Solving Problem solving assist level: Solves basic 90% of the time/requires cueing < 10% of the time  Memory Memory assist level: Recognizes or recalls 90% of the time/requires cueing < 10% of the time    Medical Problem List and Plan: 1.  Decreased functional mobility secondary to right frontal hemorrhage, small SAH and signs of PRES  Cont CIR  PRAFO/WHO   Fluoxetine started 7/3 for depression 2.  DVT Prophylaxis/Anticoagulation: SCDs.   Vascular study neg 3. Pain Management: Tylenol as needed 4. Mood: Provide  emotional support 5. Neuropsych: This patient is not fully capable of making decisions on her own behalf. 6. Skin/Wound Care: Routine skin checks 7. Fluids/Electrolytes/Nutrition: Routine I&Os 8. Seizure prophylaxis. Keppra 500 mg every 12 hours 9. ESRD with history of right renal cancer status post nephrectomy. Continue peritoneal dialysis as per renal services  -serial bmp's 10. Hypertension.  Norvasc 5 mg twice daily, Coreg 12.5 mg twice a day, Cozaar 50 mg daily  Monitor with increased mobility  Controlled 7/8 11. Hypothyroidism. Synthroid 12.Constipation. Laxative assistance 13. Hyperglycemia  Likely stress induced  Relatively controlled 7/6 14. Leukocytosis: Resolved  WBCs 8.3 7/6  Cont to monitor  UA/Ucx not collected because of difficulty collecting. Pt refuses cath for sample---will hold for now.   afebrile  LOS (Days) 6 A FACE TO FACE EVALUATION WAS PERFORMED  SWARTZ,ZACHARY T 01/10/2017 7:30 AM

## 2017-01-11 ENCOUNTER — Inpatient Hospital Stay (HOSPITAL_COMMUNITY): Payer: BLUE CROSS/BLUE SHIELD | Admitting: Speech Pathology

## 2017-01-11 ENCOUNTER — Inpatient Hospital Stay (HOSPITAL_COMMUNITY): Payer: BLUE CROSS/BLUE SHIELD

## 2017-01-11 ENCOUNTER — Inpatient Hospital Stay (HOSPITAL_COMMUNITY): Payer: BLUE CROSS/BLUE SHIELD | Admitting: Occupational Therapy

## 2017-01-11 ENCOUNTER — Inpatient Hospital Stay (HOSPITAL_COMMUNITY): Payer: BLUE CROSS/BLUE SHIELD | Admitting: Physical Therapy

## 2017-01-11 DIAGNOSIS — I69154 Hemiplegia and hemiparesis following nontraumatic intracerebral hemorrhage affecting left non-dominant side: Secondary | ICD-10-CM

## 2017-01-11 LAB — RENAL FUNCTION PANEL
Albumin: 2.7 g/dL — ABNORMAL LOW (ref 3.5–5.0)
Anion gap: 16 — ABNORMAL HIGH (ref 5–15)
BUN: 60 mg/dL — ABNORMAL HIGH (ref 6–20)
CO2: 24 mmol/L (ref 22–32)
Calcium: 7.1 mg/dL — ABNORMAL LOW (ref 8.9–10.3)
Chloride: 96 mmol/L — ABNORMAL LOW (ref 101–111)
Creatinine, Ser: 14.22 mg/dL — ABNORMAL HIGH (ref 0.44–1.00)
GFR calc Af Amer: 3 mL/min — ABNORMAL LOW (ref >60)
GFR calc non Af Amer: 3 mL/min — ABNORMAL LOW (ref >60)
Glucose, Bld: 98 mg/dL (ref 65–99)
Phosphorus: 5.8 mg/dL — ABNORMAL HIGH (ref 2.5–4.6)
Potassium: 4 mmol/L (ref 3.5–5.1)
Sodium: 136 mmol/L (ref 135–145)

## 2017-01-11 NOTE — Progress Notes (Signed)
Physical Therapy Session Note  Patient Details  Name: Terry Juarez MRN: [id number] Date of Birth: 10/11/1965  Today's Date: 01/11/2017 PT Individual Time: 1330-1345 PT Individual Time Calculation (min): 15 min   Short Term Goals: Week 1:  PT Short Term Goal 1 (Week 1): Pt will transfer from bed to w/c with moderate assist in either direction  PT Short Term Goal 2 (Week 1): Pt will perform all bed mobility with min assist PT Short Term Goal 3 (Week 1): Pt will demonstrate dynamic standing balance for functional tasks requiring moderate assistance PT Short Term Goal 4 (Week 1): Pt will propel w/c 50 ft in a controlled environment with supervision   Skilled Therapeutic Interventions/Progress Updates:   Handoff from PT to finish therapy session. Educated on use of AFO and importance of monitoring skin. Due to not being able to use with shoe insert, recommended to not wear it except for during gait trials at this time for skin protection. Pt verbalized understanding. Pt self propelled with hemi technique back to room with supervision and set up for transfer for back to bed. Required mod assist for stand pivot transfer with facilitation for weightshift and blocking of L knee. Required min assist for LLE management to return to supine and LUE positioned with pillows for support.   Therapy Documentation Precautions:  Precautions Precautions: Fall Precaution Comments: Peritoneal dialysis patient; L subluxed shoulder Restrictions Weight Bearing Restrictions: No   Pain: Does not report pain.   See Function Navigator for Current Functional Status.   Therapy/Group: Individual Therapy  Canary Brim Ivory Broad, PT, DPT  01/11/2017, 3:40 PM

## 2017-01-11 NOTE — Progress Notes (Addendum)
Physical Therapy Session Note  Patient Details  Name: Terry Juarez MRN: [id number] Date of Birth: 12/03/1965  Today's Date: 01/11/2017 PT Individual Time: 1300-1330 PT Individual Time Calculation (min): 30 min   Short Term Goals: Week 1:  PT Short Term Goal 1 (Week 1): Pt will transfer from bed to w/c with moderate assist in either direction  PT Short Term Goal 2 (Week 1): Pt will perform all bed mobility with min assist PT Short Term Goal 3 (Week 1): Pt will demonstrate dynamic standing balance for functional tasks requiring moderate assistance PT Short Term Goal 4 (Week 1): Pt will propel w/c 50 ft in a controlled environment with supervision   Skilled Therapeutic Interventions/Progress Updates:    Pt sitting in w/c upon PT arrival, agreeable to therapy. Pt propelled w/c using R hemi technique with min assist and verbal cues for L sided awareness. Pt transferred from w/c to mat with moderate assist, verbal cues for sequencing. Worked on seated balance and lateral weight shifts in order to activate core and work on postural control. Pt shifted weight onto L hip with facilitation of pelvis in order to unweight the R hip and get a playing card, she then reached up and to the left with her R UE to hand the playing card over. Pt worked on postural stability and core activation by moving from sitting to R sidelying on elbow and back to sitting with facilitation to pelvis. Worked on standing balance and postural control standing for bouts of 1-2 min x 3 with facilitation for weightshift, trunk extension, and blocking of L knee. Pt ambulated x 18 ft with hemi walker and L AFO, max assist in order to help advance L LE during swing and facilitation to shift weight over L LE and block L LE during stance phase. Pt left in w/c with PT. Pt denies pain this session.   Therapy Documentation Precautions:  Precautions Precautions: Fall Precaution Comments: Peritoneal dialysis patient; L subluxed  shoulder Restrictions Weight Bearing Restrictions: No Pain: Pain Assessment Pain Assessment: No/denies pain Pain Score: 0-No pain   See Function Navigator for Current Functional Status.   Therapy/Group: Individual Therapy  Netta Corrigan, PT, DPT 01/11/2017, 12:58 PM

## 2017-01-11 NOTE — Progress Notes (Signed)
[facility] Progress Note   Subjective: doing well, taking a bath    Objective Vitals:   01/10/17 0543 01/10/17 1500 01/10/17 1901 01/11/17 0430  BP: (!) 146/83 (!) 148/96 (!) 150/86 (!) 155/91  Pulse: 63 71 71 68  Resp: 18 16  12   Temp: 98.2 F (36.8 C) 99.8 F (37.7 C) 98.6 F (37 C) 98.2 F (36.8 C)  TempSrc: Oral Oral Oral Oral  SpO2: 97% 97% 96% 97%  Weight: 51.7 kg (113 lb 15.7 oz)  49.9 kg (110 lb 0.2 oz) 51.1 kg (112 lb 10.5 oz)  Height:       Physical Exam General: slender AAF NAD  Good spirits Regular S1S2 No S3 Lungs clear Abdomen: soft NT No LE edema Left hemiparesis. PD cath with clear dry dressing R abdomen Right upper arm AVF aneurysmal/+ bruit   Recent Labs Lab 01/09/17 0447 01/10/17 0524 01/11/17 0405  NA 134* 135 136  K 3.6 3.8 4.0  CL 96* 95* 96*  CO2 24 24 24   GLUCOSE 110* 102* 98  BUN 62* 61* 60*  CREATININE 12.87* 13.27* 14.22*  CALCIUM 7.2* 7.2* 7.1*  PHOS 5.0* 5.1* 5.8*     Recent Labs Lab 01/09/17 0447 01/10/17 0524 01/11/17 0405  ALBUMIN 2.7* 2.7* 2.7*     Recent Labs Lab 01/04/17 2148 01/07/17 0630 01/08/17 0612  WBC 14.3* 9.1 8.3  HGB 17.2* 15.5* 15.4*  HCT 54.3* 49.7* 49.1*  MCV 103.0* 101.2* 101.0*  PLT 313 290 262    Lab Results  Component Value Date   INR 0.93 12/21/2016   INR 1.88 12/19/2016   INR 0.97 12/19/2016    Medications: . sodium chloride    . sodium chloride     . calcium acetate  667 mg Oral TID WC  . carvedilol  12.5 mg Oral BID WC  . cinacalcet  60 mg Oral Q supper  . famotidine  20 mg Oral Daily  . feeding supplement (NEPRO CARB STEADY)  237 mL Oral BID BM  . FLUoxetine  10 mg Oral Daily  . levETIRAcetam  500 mg Oral BID  . levothyroxine  88 mcg Oral QAC breakfast  . multivitamin  1 tablet Oral Daily  . pantoprazole  40 mg Oral BID  . polyethylene glycol  17 g Oral Daily  . senna-docusate  1 tablet Oral BID   Dialysis:PD 5 exchanges overnight, no day bag, no  pause, 2000 volume, using all 1.5%- UF of 1.5 liters   Summary: Pt is a 51 y.o.yo femalewith ESRD on PD who was admitted on 6/16/2018with sz/hemiparesis- found to have right sided frontal lobe hemorrhage, alsowith PRES. [place]ures [place]. Had cardiac arrest 12/25/16 in Inpt HD. Transferred to Rehab 01/04/17.  Assessment: 1. Intracranial CVA/ small SAH R frontal, + PRES with L hemiparesis. Was making some clinical recovery then had cardiac arrest at inpt dialysis on 12/26/50 therefore transitioned back to PD- now making progress again. 2. ESRD- Continue [facility].  I spoke w/ Lundynn Cohoon (family in [place]) today 7/9 who says that he has committed to learning and doing PD for Ms Tavano for a 6 month period after she is discharged.  Spoke w/ out PD staff who said that they are working w/ Mr Delude and that the next decision is for him to choose a PD clinic in that part of the state, they have narrowed it down to two.   3. HTN/volume - PD solutions all 1.5% since 7/3. Losartan and amlodipine were  stopped d/t low BP's and now just on low dose carvedilol.  Weight got as low as 47.2 and she felt sluggish so we cut back on UF and wt's up and feeling better.  Cont all 1.5%. 4. Cardiac arrest - while on HD 6/22. Refuses to ever consider HD again.  5. Anemia- history of GIB and low hgb. Responded toowell to ESA and and Hb now too high (>15). Holding ESA now 6. MBD - Had hypercalcemia Ca 10.9 on 7/2.   Stopped calcium acetate but the became hypocalcemic (7.2) so resumed Ca acetate. On sensipar 60 daily; PTH still pending from 7/7 7. Nutrition Alb 2.7 - regular diet ok, has Nepro and vitamins    Plan - cont PD nightly, all 1.5 % for now.

## 2017-01-11 NOTE — Progress Notes (Signed)
Occupational Therapy Session Note  Patient Details  Name: Terry Juarez MRN: [id number] Date of Birth: 06/14/1966  Today's Date: 01/11/2017  Session 1 OT Individual Time: 1572-6203 OT Individual Time Calculation (min): 74 min   Session 2 OT Individual Time: 5597-4163 OT Individual Time Calculation (min): 30 min    Short Term Goals: Week 1:  OT Short Term Goal 1 (Week 1): Pt will be able to don a shirt with min A. OT Short Term Goal 2 (Week 1): Pt will demonstrate improved sitting balance by sitting statically on toilet with close S. OT Short Term Goal 3 (Week 1): Pt will complete squat pivot transfers on/off the toilet with min -mod A. OT Short Term Goal 4 (Week 1): Pt will demonstrate proficiency with self ROM of LUE with min cues or less. OT Short Term Goal 5 (Week 1): Pt will demonstrate improved LUE body awareness by using R hand to position L arm prior to transfers.  Skilled Therapeutic Interventions/Progress Updates:  Session 1   OT treatment session focused on modified bathing/dressing, transfer training, and L NMR. Squat-pivot transfer to stronger R side with wc set-up and min A. Modified bathing/dressing at the sink incorporating weight bearing NMR techniques with hand-over hand  A to integrate L UE into bathing tasks. Pt with good recall of hemi-techniques for dressing. Sit<>stand with min A + L knee block and facilitation for anterior weight shift while reaching to wash buttocks, then pull up pants. Worked on LB dressing techniques for donning TED hose using friction reducing device. One-handed grooming task at the sink with education for one-handed techniques and set-up for supplies. Pt then propelled wc to therapy apartment to tub room and educated pt on options for bathroom DME. Pt then propelled wc to therapy gym for e-stim focused on wrist extensors, then worked on [employer] of forearm pronation. Pt returned to room at end of session and left seated in wc with needs met.    Session 2 OT treatment session focused on L NMR. Pt brought into gravity eliminated sidelying position for L NMR focused on scap protraction/retraction. Shoulder flex/ext, elbow flex/ext. Wrist flex/ext, and forearm pronation/supination/ provided joint input to bring pt through full ROM. Pt left semi-reclined in bed with needs met, bed alarm on, and call bell in reach.  Therapy Documentation Precautions:  Precautions Precautions: Fall Precaution Comments: Peritoneal dialysis patient; L subluxed shoulder Restrictions Weight Bearing Restrictions: No Pain: Pain Assessment Pain Assessment: No/denies pain Pain Score: 0-No pain ADL: ADL ADL Comments: refer to functional navigator  See Function Navigator for Current Functional Status.   Therapy/Group: Individual Therapy  [name] 01/11/2017, 3:37 PM

## 2017-01-11 NOTE — Progress Notes (Signed)
[facility]IL[facility]     PROGRESS NOTE  Subjective/Complaints:  Pt seen sitting up in bed this AM about to eat breakfast.  She slept well overnight and states she noticed some improvement in her LLE.  ROS: Denies CP, SOB, N/V/D.  Objective: Vital Signs: Blood pressure (!) 155/91, pulse 68, temperature 98.2 F (36.8 C), temperature source Oral, resp. rate 12, height 5\' 5"  (1.651 m), weight 51.1 kg (112 lb 10.5 oz), SpO2 97 %. No results found. No results for input(s): WBC, HGB, HCT, PLT in the last 72 hours.  Recent Labs  01/10/17 0524 01/11/17 0405  NA 135 136  K 3.8 4.0  CL 95* 96*  GLUCOSE 102* 98  BUN 61* 60*  CREATININE 13.27* 14.22*  CALCIUM 7.2* 7.1*   CBG (last 3)  No results for input(s): GLUCAP in the last 72 hours.  Wt Readings from Last 3 Encounters:  01/11/17 51.1 kg (112 lb 10.5 oz)  01/04/17 50.7 kg (111 lb 12.4 oz)  09/04/16 54.4 kg (120 lb)    Physical Exam:  BP (!) 155/91 (BP Location: Left Wrist)   Pulse 68   Temp 98.2 F (36.8 C) (Oral)   Resp 12   Ht 5\' 5"  (1.651 m)   Wt 51.1 kg (112 lb 10.5 oz)   SpO2 97%   BMI 18.75 kg/m  Constitutional: She appears well-developed. Frail  HENT: Normocephalic and atraumatic.  Eyes: EOMI. No discharge.  Cardiovascular: RRR without murmur. No JVD. Respiratory: CTA Bilaterally. Normal effort .  GI: Soft. Bowel sounds are normal.   Musculoskeletal: She exhibits no edema or tenderness.  Neurological: She is alert.  Fair awareness of deficits.  Motor: RUE/RLE: 5/5 LUE/LLE: 0/5 No increase in tone Skin: Skin is warm and dry.  Psychiatric: Her behavior is normal. Her affect is blunt. She does not engage much   Assessment/Plan: 1. Functional deficits secondary to right frontal hemorrhage, small SAH and signs of PRES which require 3+ hours per day of interdisciplinary therapy in a comprehensive inpatient rehab setting. Physiatrist is providing close team supervision and 24 hour  management of active medical problems listed below. Physiatrist and rehab team continue to assess barriers to discharge/monitor patient progress toward functional and medical goals.  Function:  Bathing Bathing position   Position: Wheelchair/chair at sink  Bathing parts Body parts bathed by patient: Left arm, Chest, Abdomen, Front perineal area, Right upper leg, Left upper leg Body parts bathed by helper: Buttocks, Left lower leg, Back, Right lower leg, Right arm  Bathing assist Assist Level: Touching or steadying assistance(Pt > 75%)      Upper Body Dressing/Undressing Upper body dressing   What is the patient wearing?: Pull over shirt/dress     Pull over shirt/dress - Perfomed by patient: Thread/unthread right sleeve, Thread/unthread left sleeve, Put head through opening Pull over shirt/dress - Perfomed by helper: Pull shirt over trunk        Upper body assist Assist Level: Touching or steadying assistance(Pt > 75%)      Lower Body Dressing/Undressing Lower body dressing   What is the patient wearing?: Underwear, Pants Underwear - Performed by patient: Thread/unthread right underwear leg Underwear - Performed by helper: Thread/unthread left underwear leg, Pull underwear up/down Pants- Performed by patient: Thread/unthread right pants leg Pants- Performed by helper: Thread/unthread left pants leg, Pull pants up/down, Fasten/unfasten pants   Non-skid slipper socks- Performed by helper: Don/doff right sock, Don/doff left sock Socks - Performed by patient: Don/doff right sock  Socks - Performed by helper: Don/doff left sock Shoes - Performed by patient: Don/doff left shoe, Fasten right, Don/doff right shoe Shoes - Performed by helper: Don/doff left shoe, Fasten right, Don/doff right shoe          Lower body assist Assist for lower body dressing: Touching or steadying assistance (Pt > 75%)      Toileting Toileting Toileting activity did not occur: No continent bowel/bladder  event Toileting steps completed by patient: Performs perineal hygiene Toileting steps completed by helper: Adjust clothing prior to toileting, Performs perineal hygiene, Adjust clothing after toileting Toileting Assistive Devices: Grab bar or rail  Toileting assist Assist level: Touching or steadying assistance (Pt.75%)   Transfers Chair/bed transfer   Chair/bed transfer method: Squat pivot Chair/bed transfer assist level: Moderate assist (Pt 50 - 74%/lift or lower) Chair/bed transfer assistive device: Armrests     Locomotion Ambulation     Max distance: 8 ft Assist level: 2 helpers   Wheelchair   Type: Manual Max wheelchair distance: 152 Assist Level: Touching or steadying assistance (Pt > 75%)  Cognition Comprehension Comprehension assist level: Understands basic 90% of the time/cues < 10% of the time  Expression Expression assist level: Expresses basic 90% of the time/requires cueing < 10% of the time.  Social Interaction Social Interaction assist level: Interacts appropriately 90% of the time - Needs monitoring or encouragement for participation or interaction.  Problem Solving Problem solving assist level: Solves basic 90% of the time/requires cueing < 10% of the time  Memory Memory assist level: Recognizes or recalls 90% of the time/requires cueing < 10% of the time    Medical Problem List and Plan: 1.  Decreased functional mobility secondary to right frontal hemorrhage, small SAH and signs of PRES  Cont CIR  PRAFO/WHO   Fluoxetine started 7/3, plan to increase tomorrow 2.  DVT Prophylaxis/Anticoagulation: SCDs.   Vascular study neg 3. Pain Management: Tylenol as needed 4. Mood: Provide emotional support 5. Neuropsych: This patient is not fully capable of making decisions on her own behalf. 6. Skin/Wound Care: Routine skin checks 7. Fluids/Electrolytes/Nutrition: Routine I&Os 8. Seizure prophylaxis. Keppra 500 mg every 12 hours 9. ESRD with history of right renal  cancer status post nephrectomy. Continue peritoneal dialysis as per renal services 10. Hypertension.  Norvasc 5 mg twice daily, Coreg 12.5 mg twice a day, Cozaar 50 mg daily  Monitor with increased mobility  Controlled 7/9 11. Hypothyroidism. Synthroid 12.Constipation. Laxative assistance 13. Hyperglycemia  Likely stress induced  Relatively controlled 7/9 14. Leukocytosis: Resolved  WBCs 8.3 7/6  Cont to monitor  Ucx NG  Afebrile  LOS (Days) 7 A FACE TO FACE EVALUATION WAS PERFORMED  Shunta Mclaurin [name] 01/11/2017 8:18 AM

## 2017-01-11 NOTE — Progress Notes (Signed)
Speech Language Pathology Daily Session Note  Patient Details  Name: Terry Juarez MRN: [id number] Date of Birth: 1965-07-26  Today's Date: 01/11/2017 SLP Individual Time: 1100-1145 SLP Individual Time Calculation (min): 45 min  Short Term Goals: Week 1: SLP Short Term Goal 1 (Week 1): Patient will demonstrate functional problem solving for basic and familiar tasks with Min A verbal cues.  SLP Short Term Goal 2 (Week 1): Patient will recall new, daily information with Min A verbal cues and use of external aids.  SLP Short Term Goal 3 (Week 1): Patient will self-monitor and correct errors during functional tasks with Min A verbal cues.  SLP Short Term Goal 4 (Week 1): Patient will demonstrate selective attention in a mildly distracting enviornment to functional tasks for 30 minutes with Min A verbal cues for redirection.  SLP Short Term Goal 5 (Week 1): Patient will attend to left field of enviornment during functional tasks with Min A verbal cues.   Skilled Therapeutic Interventions: Skilled treatment focused on cognitive goals. SLP facilitated session with problem solving tasks (safety awareness) given visual picture scenes, patient identified problems and produced appropriate solutions independently, money management problem solving task with min A with verbal cuew, delayed recall tasks of multiple step directions during divided attention task with with moderate impairment, however given mild semantic cues reduced impairment to mild. Patient demonstrated ability to scan left side with min A visual cues. SLP educated patient about recall strategies to improve recall skills. Patient was left in room, sitting in chair with call button on lap. Patient will continue with treatment plan.      Function:  Cognition Comprehension Comprehension assist level: Follows basic conversation/direction with no assist  Expression   Expression assist level: Expresses basic needs/ideas: With no assist  Social  Interaction Social Interaction assist level: Interacts appropriately 90% of the time - Needs monitoring or encouragement for participation or interaction.  Problem Solving Problem solving assist level: Solves basic problems with no assist  Memory Memory assist level: Recognizes or recalls 50 - 74% of the time/requires cueing 25 - 49% of the time    Pain Pain Assessment Pain Assessment: No/denies pain  Therapy/Group: Individual Therapy  Jacque Garrels  [facility] 01/11/2017, 11:56 AM

## 2017-01-12 ENCOUNTER — Encounter (HOSPITAL_COMMUNITY): Payer: BLUE CROSS/BLUE SHIELD | Admitting: Psychology

## 2017-01-12 ENCOUNTER — Inpatient Hospital Stay (HOSPITAL_COMMUNITY): Payer: BLUE CROSS/BLUE SHIELD | Admitting: Occupational Therapy

## 2017-01-12 ENCOUNTER — Inpatient Hospital Stay (HOSPITAL_COMMUNITY): Payer: BLUE CROSS/BLUE SHIELD | Admitting: Speech Pathology

## 2017-01-12 ENCOUNTER — Inpatient Hospital Stay (HOSPITAL_COMMUNITY): Payer: BLUE CROSS/BLUE SHIELD

## 2017-01-12 MED ORDER — FLUOXETINE HCL 20 MG PO CAPS
20.0000 mg | ORAL_CAPSULE | Freq: Every day | ORAL | Status: DC
  Administered 2017-01-12 – 2017-02-01 (×21): 20 mg via ORAL
  Filled 2017-01-12 (×20): qty 1

## 2017-01-12 NOTE — Consult Note (Signed)
Neuropsychological Consultation   Patient:   Terry Juarez   DOB:   05/19/1966  MR Number:  [id number]  Location:  Terry Ju[facility] Dept: [place]: [phone]           Date of Service:   01/12/2017  Start Time:   3:30 End Time:   4:30  Provider/Observer:  Ilean Skill, Psy.D.       Clinical Neuropsychologist       Billing Code/Service: [phone] 4 Units  Chief Complaint:    Terry Juarez was referred for neuropsychological consultation due to coping and adjustment issues following CVA.  She has right side weakness.  Depressive and anxiety symptoms and frustration are reported by patient.    Reason for Service:  Terry Juarez is a 52 year old female presented 12/19/2016 with new onset seizure, acute left-side weakness and headache.  CT showed 29 mm right frontal hemorrhage with edema and mass effect and small SAH.  She was recommended for physical medicine rehabilitation and admitted for CIR program.  Below is the HPI for the current admission.  Terry Juarez a 51 y.o.right handed femalewith history of hypertension,history of GI bleed, ESRD with home peritoneal dialysis, right renal cancer status post nephrectomy.Per chart review and mother-in-law patient lives in [place] with a roommate. Independent prior to admission. One level home with 3 steps to entry. Question 24-hour assistance on discharge.Presented 12/19/2016 with new onset of seizure, acute left-sided weakness and right frontal headache. UDS positive for benzos. Troponin 1.21. CT scan imaging showed a 29 mm right frontal hemorrhage with surrounding edema and mass effect, also a small SAH and signs of PRES. CTA head and neck showed no AVMs but showed the hematoma was mildly increased from the earlier imaging. Maintain on nicardipine drip as well as Keppra. MRI of the brain reviewed, showing  stable hemorrhage. Latest cranial CT head 12/24/2016 reviewed, stable. EEG showed diffuse cerebral dysfunction no seizure activity.Dialysis ongoing as per renal services. Dysphagia #3 thin liquid diet. Therapy evaluations completed 12/22/2016 with recommendations of physical medicine rehabilitation consult.Patient was admitted for a comprehensive rehabilitation program  Current Status:  The patient has had some adjustment issues with mixed depression and anxiety.    Behavioral Observation: Terry Juarez  presents as a 51 y.o.-year-old Right African American Female who appeared her stated age. her dress was Appropriate and she was Well Groomed and her manners were Appropriate to the situation.  her participation was indicative of Appropriate behaviors.  There were physical disabilities noted to left side weakness from CVA.  she displayed an appropriate level of cooperation and motivation.     Interactions:    Active Appropriate  Attention:   abnormal and attention span appeared shorter than expected for age  Memory:   abnormal; remote memory intact, recent memory impaired  Visuo-spatial:  within normal limits  Speech (Volume):  low  Speech:   normal;   Thought Process:  Coherent and Relevant  Though Content:  WNL;   Orientation:   person, place, time/date and situation  Judgment:   Fair  Planning:   Fair  Affect:    Anxious and Depressed  Mood:    Depressed  Insight:   Fair  Intelligence:   normal  Medical History:   Past Medical History:  Diagnosis Date  . Anemia   . Bruises easily   . Dialysis patient ([place])   . ESRD  on dialysis [facility])   . Hyperlipidemia   . Hypertension   . Renal disorder    rt renal mass / < functioning of left kidney - being prepared for possible dialysis  . Right renal mass         Psychiatric History:  Patient does acknowledge issues with depression and anxiety related to renal disorder and other medical issues.  Family Med/Psych  History:  Family History  Problem Relation Age of Onset  . Throat cancer Mother        smoked  . Hypertension Father     Risk of Suicide/Violence: virtually non-existent Patient denies SI or HI  Impression/DX:  Terry Juarez is a 51 year old female presented 12/19/2016 with new onset seizure, acute left-side weakness and headache.  CT showed 29 mm right frontal hemorrhage with edema and mass effect and small SAH.  She was recommended for physical medicine rehabilitation and admitted for CIR program.  The patient was referred for neuropsychological consultation due to coping and adjustment issues following CVA.  She has right side weakness.  Depressive and anxiety symptoms and frustration are reported by patient.    Disposition/Plan:  Will work on coping and adjustment issues with patient.         Electronically Signed   _______________________ Ilean Skill, Psy.D.

## 2017-01-12 NOTE — Progress Notes (Signed)
Speech Language Pathology Weekly Progress and Session Note  Patient Details  Name: ANALYS RYDEN MRN: [id number] Date of Birth: 05/15/1966  Beginning of progress report period: January 05, 2017 End of progress report period: January 12, 2017  Today's Date: 01/12/2017 SLP Individual Time: 0900-0940 SLP Individual Time Calculation (min): 40 min  Short Term Goals: Week 1: SLP Short Term Goal 1 (Week 1): Patient will demonstrate functional problem solving for basic and familiar tasks with Min A verbal cues.  SLP Short Term Goal 1 - Progress (Week 1): Met SLP Short Term Goal 2 (Week 1): Patient will recall new, daily information with Min A verbal cues and use of external aids.  SLP Short Term Goal 2 - Progress (Week 1): Met SLP Short Term Goal 3 (Week 1): Patient will self-monitor and correct errors during functional tasks with Min A verbal cues.  SLP Short Term Goal 3 - Progress (Week 1): Met SLP Short Term Goal 4 (Week 1): Patient will demonstrate selective attention in a mildly distracting enviornment to functional tasks for 30 minutes with Min A verbal cues for redirection.  SLP Short Term Goal 4 - Progress (Week 1): Met SLP Short Term Goal 5 (Week 1): Patient will attend to left field of enviornment during functional tasks with Min A verbal cues.  SLP Short Term Goal 5 - Progress (Week 1): Met    New Short Term Goals: Week 2: SLP Short Term Goal 1 (Week 2): Patient will demonstrate functional problem solving for mildly complex tasks with Min A verbal cues.  SLP Short Term Goal 2 (Week 2): Patient will demonstrate alternating attention during 2 functional tasks for 30 minutes with Min A verbal cues for redirection.  SLP Short Term Goal 3 (Week 2): Patient will attend to left field of enviornment during functional tasks with supervision verbal cues.  SLP Short Term Goal 4 (Week 2): Patient will self-monitor and correct errors during functional tasks with supervision verbal cues.  SLP Short Term  Goal 5 (Week 2): Patient will recall new, daily information with supervision verbal cues and use of external aids.   Weekly Progress Updates: Patient has made excellent gains and has met 5 of 5 STG's this reporting period. Currently, patient requires overall Supervision-Min A verbal cues to complete functional and familiar tasks safely in regards to problem solving, recall, emergent awareness, and visual scanning to the left. Patient and family education is ongoing. Patient would benefit from continued skilled SLP intervention to maximize her cognitive function and overall functional independence prior to discharge home.    Intensity: Minumum of 1-2 x/day, 30 to 90 minutes Frequency: 3 to 5 out of 7 days Duration/Length of Stay:  Treatment/Interventions: Cognitive remediation/compensation;Cueing hierarchy;Functional tasks;Patient/family education;Therapeutic Activities;Internal/external aids;Environmental controls   Daily Session  Skilled Therapeutic Interventions: Skilled treatment session focused on cognitive goals. SLP facilitated session by providing supervision verbal cues for recall during a novel, complex task. Patient demonstrated delayed recall after a 10 minute delay with Min A verbal cues. Patient left upright in bed with all needs within reach. Continue with current plan of care.       Function:   Cognition Comprehension Comprehension assist level: Understands basic 90% of the time/cues < 10% of the time  Expression   Expression assist level: Expresses basic 90% of the time/requires cueing < 10% of the time.  Social Interaction Social Interaction assist level: Interacts appropriately 90% of the time - Needs monitoring or encouragement for participation or interaction.  Problem Solving Problem  solving assist level: Solves basic 90% of the time/requires cueing < 10% of the time  Memory Memory assist level: Recognizes or recalls 75 - 89% of the time/requires cueing 10 - 24% of the  time   Pain No/Denies Pain   Therapy/Group: Individual Therapy  Zamere Pasternak 01/12/2017, 2:16 PM

## 2017-01-12 NOTE — Progress Notes (Signed)
Physical Therapy Session Note  Patient Details  Name: Terry Juarez MRN: [id number] Date of Birth: [birth year]  Today's Date: 01/12/2017 PT Individual Time: 1300-1345 PT Individual Time Calculation (min): 45 min   Short Term Goals: Week 1:  PT Short Term Goal 1 (Week 1): Pt will transfer from bed to w/c with moderate assist in either direction  PT Short Term Goal 2 (Week 1): Pt will perform all bed mobility with min assist PT Short Term Goal 3 (Week 1): Pt will demonstrate dynamic standing balance for functional tasks requiring moderate assistance PT Short Term Goal 4 (Week 1): Pt will propel w/c 50 ft in a controlled environment with supervision   Skilled Therapeutic Interventions/Progress Updates:    Pt supine in bed upon PT arrival, agreeable to therapy tx. Pt transferred from supine to sitting EOB with min assist to help move L LE. PT transferred from EOB to w/c with moderate assist. Pt donned and placed L Leg rest and adjusted w/c breaks working on dynamic sitting balance with occasional loss of balance to the L when reaching to the L. Pt transferred from w/c to mat with mod assist, verbal cues for sequencing. Stretched HS and calfs 2 x 30 sec. Session focused on core activation, postural control and seated balance. Pt sat edge of mat without UE support working on maintaining balance during perturbations to trunk. Pt worked on seated limits of stability, specifically with L lateral leaning, pt recovered min-mod assist at time to recover from LOB. Worked on static standing balance without UE support x 2 min, x 4 min (limited by fatigue and weakness) with moderate assist and facilitation for hip and trunk extension, blocking on L LE. Sit to standing x 3 pt placing UEs on L LE to encourage WB, moderate assist for facilitation of weightshift and to maintain balance. Pt propelled w/c back to her room with min assist and transferred back to bed with mod assist. Pt left supine in bed with call bell in  reach. Pt reported 4/10 pain in R shoulder during session, releived with manual support to UE from PT during interventions.   Therapy Documentation Precautions:  Precautions Precautions: Fall Precaution Comments: Peritoneal dialysis patient; L subluxed shoulder Restrictions Weight Bearing Restrictions: No   Vital Signs: Therapy Vitals Temp: 98.8 F (37.1 C) Temp Source: Oral Pulse Rate: 68 Resp: 16 BP: (!) 145/88 Patient Position (if appropriate): Lying Oxygen Therapy SpO2: 98 % O2 Device: Not Delivered   See Function Navigator for Current Functional Status.   Therapy/Group: Individual Therapy  Netta Corrigan, PT, DPT 01/12/2017, 2:27 PM

## 2017-01-12 NOTE — Progress Notes (Signed)
[place] PHYSICAL MEDICINE & REHABILITATION     PROGRESS NOTE  Subjective/Complaints:  Pt seen laying in bed this AM.  She slept well overnight.  She states she did not have her braces on at night.  Reminded nurses of need for bracing.   ROS: Denies CP, SOB, N/V/D.  Objective: Vital Signs: Blood pressure (!) 166/99, pulse 70, temperature 98.1 F (36.7 C), temperature source Oral, resp. rate 16, height 5\' 5"  (1.651 m), weight 50.2 kg (110 lb 10.7 oz), SpO2 98 %. No results found. No results for input(s): WBC, HGB, HCT, PLT in the last 72 hours.  Recent Labs  01/10/17 0524 01/11/17 0405  NA 135 136  K 3.8 4.0  CL 95* 96*  GLUCOSE 102* 98  BUN 61* 60*  CREATININE 13.27* 14.22*  CALCIUM 7.2* 7.1*   CBG (last 3)  No results for input(s): GLUCAP in the last 72 hours.  Wt Readings from Last 3 Encounters:  01/12/17 50.2 kg (110 lb 10.7 oz)  01/04/17 50.7 kg (111 lb 12.4 oz)  09/04/16 54.4 kg (120 lb)    Physical Exam:  BP (!) 166/99 (BP Location: Left Arm)   Pulse 70   Temp 98.1 F (36.7 C) (Oral)   Resp 16   Ht 5\' 5"  (1.651 m)   Wt 50.2 kg (110 lb 10.7 oz)   SpO2 98%   BMI 18.42 kg/m  Constitutional: She appears well-developed. Frail  HENT: Normocephalic and atraumatic.  Eyes: EOMI. No discharge.  Cardiovascular: RRR. No JVD. Respiratory: CTA Bilaterally. Normal effort .  GI: Soft. Bowel sounds are normal.   Musculoskeletal: She exhibits no edema or tenderness.  Neurological: She is alert.  Fair awareness of deficits.  Motor: RUE/RLE: 5/5 LUE/LLE: 0/5 No increase in tone Skin: Skin is warm and dry.  Psychiatric: Her behavior is normal. Her affect is blunt. She does not engage much   Assessment/Plan: 1. Functional deficits secondary to right frontal hemorrhage, small SAH and signs of PRES which require 3+ hours per day of interdisciplinary therapy in a comprehensive inpatient rehab setting. Physiatrist is providing close team supervision and 24 hour  management of active medical problems listed below. Physiatrist and rehab team continue to assess barriers to discharge/monitor patient progress toward functional and medical goals.  Function:  Bathing Bathing position   Position: Wheelchair/chair at sink  Bathing parts Body parts bathed by patient: Left arm, Chest, Abdomen, Front perineal area, Right upper leg, Left upper leg Body parts bathed by helper: Buttocks, Left lower leg, Back, Right lower leg, Right arm  Bathing assist Assist Level: Touching or steadying assistance(Pt > 75%)      Upper Body Dressing/Undressing Upper body dressing   What is the patient wearing?: Pull over shirt/dress     Pull over shirt/dress - Perfomed by patient: Thread/unthread right sleeve, Thread/unthread left sleeve, Put head through opening Pull over shirt/dress - Perfomed by helper: Pull shirt over trunk        Upper body assist Assist Level: Touching or steadying assistance(Pt > 75%)      Lower Body Dressing/Undressing Lower body dressing   What is the patient wearing?: Underwear, Pants Underwear - Performed by patient: Thread/unthread right underwear leg Underwear - Performed by helper: Thread/unthread left underwear leg, Pull underwear up/down Pants- Performed by patient: Thread/unthread right pants leg Pants- Performed by helper: Thread/unthread left pants leg, Pull pants up/down, Fasten/unfasten pants   Non-skid slipper socks- Performed by helper: Don/doff right sock, Don/doff left sock Socks - Performed by  patient: Don/doff right sock Socks - Performed by helper: Don/doff left sock Shoes - Performed by patient: Don/doff left shoe, Fasten right, Don/doff right shoe Shoes - Performed by helper: Don/doff left shoe, Fasten right, Don/doff right shoe          Lower body assist Assist for lower body dressing: Touching or steadying assistance (Pt > 75%)      Toileting Toileting Toileting activity did not occur: No continent bowel/bladder  event Toileting steps completed by patient: Performs perineal hygiene Toileting steps completed by helper: Adjust clothing prior to toileting, Performs perineal hygiene, Adjust clothing after toileting Toileting Assistive Devices: Grab bar or rail  Toileting assist Assist level: Touching or steadying assistance (Pt.75%)   Transfers Chair/bed transfer   Chair/bed transfer method: Squat pivot Chair/bed transfer assist level: Moderate assist (Pt 50 - 74%/lift or lower) Chair/bed transfer assistive device: Armrests     Locomotion Ambulation     Max distance: 18 Assist level: Maximal assist (Pt 25 - 49%)   Wheelchair   Type: Manual Max wheelchair distance: 150 Assist Level: Touching or steadying assistance (Pt > 75%)  Cognition Comprehension Comprehension assist level: Understands basic 90% of the time/cues < 10% of the time  Expression Expression assist level: Expresses basic 90% of the time/requires cueing < 10% of the time.  Social Interaction Social Interaction assist level: Interacts appropriately 90% of the time - Needs monitoring or encouragement for participation or interaction.  Problem Solving Problem solving assist level: Solves basic 90% of the time/requires cueing < 10% of the time  Memory Memory assist level: Recognizes or recalls 75 - 89% of the time/requires cueing 10 - 24% of the time    Medical Problem List and Plan: 1.  Decreased functional mobility secondary to right frontal hemorrhage, small SAH and signs of PRES  Cont CIR  PRAFO/WHO   Fluoxetine started 7/3, increased to 20 on 7/10 2.  DVT Prophylaxis/Anticoagulation: SCDs.   Vascular study neg 3. Pain Management: Tylenol as needed 4. Mood: Provide emotional support 5. Neuropsych: This patient is not fully capable of making decisions on her own behalf. 6. Skin/Wound Care: Routine skin checks 7. Fluids/Electrolytes/Nutrition: Routine I&Os 8. Seizure prophylaxis. Keppra 500 mg every 12 hours 9. ESRD with  history of right renal cancer status post nephrectomy. Continue peritoneal dialysis as per renal services 10. Hypertension.  Norvasc 5 mg twice daily, Coreg 12.5 mg twice a day, Cozaar 50 mg daily  Monitor with increased mobility  Elevated this AM, otherwise relatively controlled 11. Hypothyroidism. Synthroid 12.Constipation. Laxative assistance 13. Hyperglycemia  Likely stress induced  Relatively controlled 7/9 14. Leukocytosis: Resolved  WBCs 8.3 7/6  Cont to monitor  Ucx NG  Afebrile  LOS (Days) 8 A FACE TO FACE EVALUATION WAS PERFORMED  Vanice Rappa [name] 01/12/2017 7:57 AM

## 2017-01-12 NOTE — Progress Notes (Signed)
Terry Juarez Progress Note   Subjective: doing well. Wt's up 50.2kg, BP's up.     Objective Vitals:   01/11/17 1436 01/11/17 2016 01/11/17 2045 01/12/17 0500  BP: (!) 140/92  (!) 141/86 (!) 166/99  Pulse: 77  70 70  Resp: 17  16 16   Temp: 99 F (37.2 C)  98.6 F (37 C) 98.1 F (36.7 C)  TempSrc: Oral  Oral Oral  SpO2: 98%   98%  Weight:  49.7 kg (109 lb 9.1 oz) 49.8 kg (109 lb 12.6 oz) 50.2 kg (110 lb 10.7 oz)  Height:       Physical Exam General: slender AAF NAD  Good spirits Regular S1S2 No S3 Lungs clear Abdomen: soft NT No LE edema Left hemiparesis. PD cath with clear dry dressing R abdomen Right upper arm AVF aneurysmal/+ bruit   Dialysis:PD 5 exchanges overnight, no day bag, no pause, 2000 volume, using all 1.5%- UF of 1.5 liters   Summary: Pt is a 51 y.o.yo femalewith ESRD on PD who was admitted on 6/16/2018with sz/hemiparesis- found to have right sided frontal lobe hemorrhage, alsowith PRES. Seizures controlled in [place]. Had cardiac arrest 12/25/16 in Inpt HD. Transferred to Rehab 01/04/17.  Assessment: 1. Intracranial CVA/ small SAH R frontal, + PRES with L hemiparesis. Was recovering then had cardiac arrest inpt dialysis on 12/25/17 therefore transitioned back to PD- now making progress again. 2. ESRD- Continue [facility].  We spoke w/ Terry Juarez (family in [place]) today 7/9 who says that he has committed to learning and doing PD for Terry Juarez for a 6 month period after she is discharged.  Spoke w/ OP PD staff who said that they are working w/ Terry Juarez and that the next decision is for him to choose a PD clinic in that part of the state; they have narrowed it down to two.   3. HTN/volume - PD solutions all 1.5% since 7/3. Losartan and amlodipine were stopped d/t low BP's and now just on low dose carvedilol.  Weight got as low as 47.2 and she felt sluggish so we cut back on UF and wt's up and feeling better.  Cont all 1.5% 4. Cardiac arrest -  while on HD 6/22. Refuses to ever consider HD again.  5. Anemia- history of GIB and low hgb. Responded toowell to ESA and and Hb now too high (>15). Holding ESA now 6. MBD - Had hypercalcemia Ca 10.9 on 7/2.   Stopped calcium acetate but the became hypocalcemic (7.2) so resumed Ca acetate. On sensipar 60 daily; PTH still pending from 7/7 7. Nutrition Alb 2.7 - regular diet ok, has Nepro and vitamins    Plan - cont PD nightly, all 1.5 % for now.   Recent Labs Lab 01/09/17 0447 01/10/17 0524 01/11/17 0405  NA 134* 135 136  K 3.6 3.8 4.0  CL 96* 95* 96*  CO2 24 24 24   GLUCOSE 110* 102* 98  BUN 62* 61* 60*  CREATININE 12.87* 13.27* 14.22*  CALCIUM 7.2* 7.2* 7.1*  PHOS 5.0* 5.1* 5.8*     Recent Labs Lab 01/09/17 0447 01/10/17 0524 01/11/17 0405  ALBUMIN 2.7* 2.7* 2.7*     Recent Labs Lab 01/07/17 0630 01/08/17 0612  WBC 9.1 8.3  HGB 15.5* 15.4*  HCT 49.7* 49.1*  MCV 101.2* 101.0*  PLT 290 262    Lab Results  Component Value Date   INR 0.93 12/21/2016   INR 1.88 12/19/2016   INR 0.97 12/19/2016  Medications: . sodium chloride    . sodium chloride     . calcium acetate  667 mg Oral TID WC  . carvedilol  12.5 mg Oral BID WC  . cinacalcet  60 mg Oral Q supper  . famotidine  20 mg Oral Daily  . feeding supplement (NEPRO CARB STEADY)  237 mL Oral BID BM  . FLUoxetine  20 mg Oral Daily  . levETIRAcetam  500 mg Oral BID  . levothyroxine  88 mcg Oral QAC breakfast  . multivitamin  1 tablet Oral Daily  . pantoprazole  40 mg Oral BID  . polyethylene glycol  17 g Oral Daily  . senna-docusate  1 tablet Oral BID

## 2017-01-12 NOTE — Progress Notes (Signed)
Occupational Therapy Session Note  Patient Details  Name: Terry Juarez MRN: [id number] Date of Birth: 1965/10/12  Today's Date: 01/12/2017  Session 1 OT Individual Time: 1000-1100 OT Individual Time Calculation (min): 60 min  Session 2 OT Individual Time: 6294-7654 OT Individual Time Calculation (min): 47 min    Short Term Goals: Week 1:  OT Short Term Goal 1 (Week 1): Pt will be able to don a shirt with min A. OT Short Term Goal 2 (Week 1): Pt will demonstrate improved sitting balance by sitting statically on toilet with close S. OT Short Term Goal 3 (Week 1): Pt will complete squat pivot transfers on/off the toilet with min -mod A. OT Short Term Goal 4 (Week 1): Pt will demonstrate proficiency with self ROM of LUE with min cues or less. OT Short Term Goal 5 (Week 1): Pt will demonstrate improved LUE body awareness by using R hand to position L arm prior to transfers.  Skilled Therapeutic Interventions/Progress Updates:  Session 1   OT treatment session focused on modified bathing/dressing, shower transfers, and L NMR. Pt's IV and PD site covered with water proof dressing with pt supine in bed. Pt then completed stand-pivot transfer to L with Mod A and facilitation for pivot and weight shift. Squat-pivot into tub on R side using grab bars and min A. Shower level bathing with NMR techniques and hand-over hand A to facilitate weight bearing with L UE. Supervision overall for sitting balance with mod A for balance when leaning to L  To wash buttocks. Squat-pivot to L out of shower with max A to facilitate head/hips relationship with pivot. Min A overall for UB dressing to thread L UE completely through shirt. LLE assist to achieve figure 4 position, then pt able to reach to thread pant legs and maintain sitting balance with min guard A. Min A sit<>stand with Mod A + L LE knee block for standing balance without UE support when reaching to pull up pants. Pt declined to stay in wc s/p BADL  session reporting wc was not comfortable, despite encouragement. Squat-pivot to return to bed on R side with min guard A. Pt left semi-reclined in bed with L UE supported and needs met.   Session 2 OT treatment session focused on NMES, LB dressing, and community reintegration at wc level. OT applied shoe button for increased independence with LB dressing. Pt able to demonstrate appropriate use of shoe button with set-up A to position LLE into figure 4 position. Squat-pivot to R with min A. OT discussed safety awareness within community environment and wc access using public elevators. Pt propelled w.c outside with min cues for positioning wc and to avoid objects on L side in and out of elevator. Pt propelled wc up slight incline to picnic table. 1:1 NMES applied to wrist extensors on channel 1. Channel 2 NMES applied to supraspinatus and middle deltoid to help approximate shoulder joint to reduce sublux. Pt returned to room at end of session and left seated in recliner with needs met and call bell in reach.   Ratio 1:1 Rate 35 pps Waveform- Asymmetric Ramp 1.0 Pulse 300 Intensity- 23-26 Duration -   15  No adverse reactions after treatment and is skin intact.   Therapy Documentation Precautions:  Precautions Precautions: Fall Precaution Comments: Peritoneal dialysis patient; L subluxed shoulder Restrictions Weight Bearing Restrictions: No Pain:  none/denies pain ADL: ADL ADL Comments: refer to functional navigator  See Function Navigator for Current Functional Status.   Therapy/Group:  Individual Therapy  [name] 01/12/2017, 3:43 PM

## 2017-01-13 ENCOUNTER — Inpatient Hospital Stay (HOSPITAL_COMMUNITY): Payer: BLUE CROSS/BLUE SHIELD | Admitting: Occupational Therapy

## 2017-01-13 ENCOUNTER — Inpatient Hospital Stay (HOSPITAL_COMMUNITY): Payer: BLUE CROSS/BLUE SHIELD | Admitting: Speech Pathology

## 2017-01-13 ENCOUNTER — Inpatient Hospital Stay (HOSPITAL_COMMUNITY): Payer: BLUE CROSS/BLUE SHIELD | Admitting: Physical Therapy

## 2017-01-13 ENCOUNTER — Encounter (HOSPITAL_COMMUNITY): Payer: Self-pay

## 2017-01-13 MED ORDER — CINACALCET HCL 30 MG PO TABS
30.0000 mg | ORAL_TABLET | Freq: Every day | ORAL | Status: DC
  Administered 2017-01-13 – 2017-01-15 (×3): 30 mg via ORAL
  Filled 2017-01-13 (×3): qty 1

## 2017-01-13 MED ORDER — CARVEDILOL 12.5 MG PO TABS
12.5000 mg | ORAL_TABLET | Freq: Two times a day (BID) | ORAL | Status: DC
  Administered 2017-01-13 – 2017-02-01 (×35): 12.5 mg via ORAL
  Filled 2017-01-13 (×37): qty 1

## 2017-01-13 MED ORDER — AMLODIPINE BESYLATE 5 MG PO TABS
5.0000 mg | ORAL_TABLET | Freq: Every day | ORAL | Status: DC
  Filled 2017-01-13: qty 1

## 2017-01-13 MED ORDER — SENNOSIDES-DOCUSATE SODIUM 8.6-50 MG PO TABS
2.0000 | ORAL_TABLET | Freq: Two times a day (BID) | ORAL | Status: DC
  Administered 2017-01-13 – 2017-01-27 (×19): 2 via ORAL
  Filled 2017-01-13 (×28): qty 2

## 2017-01-13 MED ORDER — AMLODIPINE BESYLATE 10 MG PO TABS
10.0000 mg | ORAL_TABLET | Freq: Every day | ORAL | Status: DC
  Administered 2017-01-13 – 2017-02-01 (×20): 10 mg via ORAL
  Filled 2017-01-13 (×21): qty 1

## 2017-01-13 NOTE — Progress Notes (Signed)
Occupational Therapy Weekly Progress Note  Patient Details  Name: Terry Juarez MRN: [id number] Date of Birth: 01/28/1966  Beginning of progress report period: January 05, 2017 End of progress report period: January 13, 2017  Today's Date: 01/13/2017 OT Individual Time: 1015-1100 OT Individual Time Calculation (min): 45 min    Patient has met 5 of 5 short term goals.  Pt is making steady progress with OT treatments at this time.  She is able to complete all transfers with overall min/Mod A. We are working on improved L UE function. She has good recall of hemi-techniques and is progressing well within ADL.  Will continue with current OT treatment POC.     Patient continues to demonstrate the following deficits: muscle weakness, abnormal tone and decreased coordination, decreased midline orientation and decreased attention to left and decreased sitting balance, decreased standing balance, decreased postural control, hemiplegia and decreased balance strategies and therefore will continue to benefit from skilled OT intervention to enhance overall performance with BADL and Reduce care partner burden.  Patient progressing toward long term goals..  Continue plan of care.  OT Short Term Goals Week 1:  OT Short Term Goal 1 (Week 1): Pt will be able to don a shirt with min A. OT Short Term Goal 1 - Progress (Week 1): Met OT Short Term Goal 2 (Week 1): Pt will demonstrate improved sitting balance by sitting statically on toilet with close S. OT Short Term Goal 2 - Progress (Week 1): Met OT Short Term Goal 3 (Week 1): Pt will complete squat pivot transfers on/off the toilet with min -mod A. OT Short Term Goal 3 - Progress (Week 1): Met OT Short Term Goal 4 (Week 1): Pt will demonstrate proficiency with self ROM of LUE with min cues or less. OT Short Term Goal 4 - Progress (Week 1): Met OT Short Term Goal 5 (Week 1): Pt will demonstrate improved LUE body awareness by using R hand to position L arm prior to  transfers. OT Short Term Goal 5 - Progress (Week 1): Met Week 2:  OT Short Term Goal 1 (Week 2): Pt will complete toilet transfers with consistent min A OT Short Term Goal 2 (Week 2): Pt will utilize L UE as a stabilizer with min questioning cues OT Short Term Goal 3 (Week 2): Pt will complete LB dressing with consistent min A  Skilled Therapeutic Interventions/Progress Updates:      OT treatment session focused on transfers and modified UB and LB dressing. Pt completed stand-pivot transfer to R with min A initially, with lateral LOB and L knee buckle requiring mod A to complete pivot safely. Pt then maneuvered wc to dresser and picked out clothing for the day. OT reviewed hemi-technique for donning shirt and pt demonstrated understanding with only min A to pull shirt down back. Pt then worked on standing balance while doffing pants with mod A and L knee block. Pt able to pick up LLE and cross over R knee to achieve figure 4 position. Min gaud A for sitting balance while reaching outside base of support to thread pant legs. OT then applied shoe buttons to new tennis shoes and pt demonstrated technique. Min/Mod A to don footwear on L side.   Therapy Documentation Precautions:  Precautions Precautions: Fall Precaution Comments: Peritoneal dialysis patient; L subluxed shoulder Restrictions Weight Bearing Restrictions: No Pain:  none/denies ADL: ADL ADL Comments: refer to functional navigator  See Function Navigator for Current Functional Status.   Therapy/Group: Individual Therapy  Daneen Schick Donaldson Richter 01/13/2017, 1:03 PM

## 2017-01-13 NOTE — Progress Notes (Signed)
[facility] Progress Note   Subjective: doing well. Wt stable, bp's up x 3 days.  Ca mid 7's    Objective Vitals:   01/12/17 0920 01/12/17 1414 01/12/17 1830 01/13/17 0500  BP: (!) 144/93 (!) 145/88 (!) 148/86 (!) 162/88  Pulse: 68 68 70 66  Resp: 16 16 16 16   Temp: 98.7 F (37.1 C) 98.8 F (37.1 C) 98.5 F (36.9 C) 98.5 F (36.9 C)  TempSrc: Oral Oral Oral Oral  SpO2:  98%  97%  Weight: 49.2 kg (108 lb 7.5 oz)  49.9 kg (110 lb 0.2 oz) 48.7 kg (107 lb 6.4 oz)  Height:       Physical Exam General: slender AAF NAD  Good spirits Regular S1S2 No S3 Lungs clear Abdomen: soft NT No LE edema Left hemiparesis. PD cath with clear dry dressing R abdomen Right upper arm AVF aneurysmal/+ bruit   Dialysis:PD 5 exchanges overnight, no day bag, no pause, 2000 volume, using all 1.5%- UF of 1.5 liters   Summary: Pt is a 51 y.o.yo femalewith ESRD on PD who was admitted on 6/16/2018with sz/hemiparesis- found to have right sided frontal lobe hemorrhage, alsowith PRES. Seizures controlled in [place]. Had cardiac arrest 12/25/16 in Inpt HD. Transferred to Rehab 01/04/17.  Assessment: 1. Intracranial CVA/ small SAH R frontal, + PRES with L hemiparesis - pt was recovering then cardiac arrest at inpt dialysis on 12/25/17; then transitioned back to PD- making progress again. 2. ESRD- Continue CCPD.  We spoke w/ Tedd Sias (family in [place]) who says that he has committed to learning and doing PD for Ms Searson for a 6 month period after she is discharged.  Spoke w/ OP PD staff who said that they are working w/ Mr Flight and that the next decision is for him to choose a PD clinic in that part of the state; they have narrowed it down to two as of 7/9.   3. HTN/volume - PD solutions all 1.5% since 7/3. BP's back up, will ^norvasc 10/d.  Wt's stable. 4. Cardiac arrest - while on HD 6/22. Refuses to ever consider HD again.  5. Anemia- history of GIB and low hgb. Responded  toowell to ESA and and Hb now too high (>15). Holding ESA now 6. MBD - Had hypercalcemia Ca 10.9 on 7/2.   Stopped calcium acetate but the became hypocalcemic (7.2) so resumed Ca acetate. On sensipar 60 daily but Ca still low and pth is in range (pth= 177). Decrease sensipar 30/d.  7. Nutrition Alb 2.7 - regular diet ok, has Nepro and vitamins    Plan - cont PD nightly, all 1.5 % for now. Josem Kaufmann. Decrease senispar  Recent Labs Lab 01/09/17 0447 01/10/17 0524 01/11/17 0405  NA 134* 135 136  K 3.6 3.8 4.0  CL 96* 95* 96*  CO2 24 24 24   GLUCOSE 110* 102* 98  BUN 62* 61* 60*  CREATININE 12.87* 13.27* 14.22*  CALCIUM 7.2* 7.2* 7.1*  PHOS 5.0* 5.1* 5.8*     Recent Labs Lab 01/09/17 0447 01/10/17 0524 01/11/17 0405  ALBUMIN 2.7* 2.7* 2.7*     Recent Labs Lab 01/07/17 0630 01/08/17 0612  WBC 9.1 8.3  HGB 15.5* 15.4*  HCT 49.7* 49.1*  MCV 101.2* 101.0*  PLT 290 262    Lab Results  Component Value Date   INR 0.93 12/21/2016   INR 1.88 12/19/2016   INR 0.97 12/19/2016    Medications:  . [START ON 01/14/2017] amLODipine  10 mg  Oral Daily  . calcium acetate  667 mg Oral TID WC  . carvedilol  12.5 mg Oral BID WC  . cinacalcet  60 mg Oral Q supper  . famotidine  20 mg Oral Daily  . feeding supplement (NEPRO CARB STEADY)  237 mL Oral BID BM  . FLUoxetine  20 mg Oral Daily  . levETIRAcetam  500 mg Oral BID  . levothyroxine  88 mcg Oral QAC breakfast  . multivitamin  1 tablet Oral Daily  . pantoprazole  40 mg Oral BID  . polyethylene glycol  17 g Oral Daily  . senna-docusate  2 tablet Oral BID

## 2017-01-13 NOTE — Progress Notes (Signed)
Speech Language Pathology Daily Session Note  Patient Details  Name: Terry Juarez MRN: [id number] Date of Birth: 1965-08-04  Today's Date: 01/13/2017 SLP Individual Time: 7628-3151 SLP Individual Time Calculation (min): 25 min  Short Term Goals: Week 2: SLP Short Term Goal 1 (Week 2): Patient will demonstrate functional problem solving for mildly complex tasks with Min A verbal cues.  SLP Short Term Goal 2 (Week 2): Patient will demonstrate alternating attention during 2 functional tasks for 30 minutes with Min A verbal cues for redirection.  SLP Short Term Goal 3 (Week 2): Patient will attend to left field of enviornment during functional tasks with supervision verbal cues.  SLP Short Term Goal 4 (Week 2): Patient will self-monitor and correct errors during functional tasks with supervision verbal cues.  SLP Short Term Goal 5 (Week 2): Patient will recall new, daily information with supervision verbal cues and use of external aids.   Skilled Therapeutic Interventions: Skilled treatment session focused on cognitive goals. SLP facilitated session by providing supervision verbal cues during a complex problem solving and organization task. Patient left upright in bed with all needs within reach. Continue with current plan of care.    Function:  Cognition Comprehension Comprehension assist level: Understands basic 90% of the time/cues < 10% of the time  Expression   Expression assist level: Expresses basic 90% of the time/requires cueing < 10% of the time.  Social Interaction Social Interaction assist level: Interacts appropriately 90% of the time - Needs monitoring or encouragement for participation or interaction.  Problem Solving Problem solving assist level: Solves complex 90% of the time/cues < 10% of the time  Memory Memory assist level: Recognizes or recalls 90% of the time/requires cueing < 10% of the time    Pain No/Denies Pain   Therapy/Group: Individual Therapy  Krishawna Stiefel,  [place] 01/13/2017, 11:18 AM

## 2017-01-13 NOTE — Progress Notes (Addendum)
[facility]     PROGRESS NOTE  Subjective/Complaints:   No issues overnite, constipated and willing to increase laxative Was on 3 BP meds at home but doesn't remember dosing  ROS: Denies CP, SOB, N/V/D.  Objective: Vital Signs: Blood pressure (!) 162/88, pulse 66, temperature 98.5 F (36.9 C), temperature source Oral, resp. rate 16, height '5\' 5"'  (1.651 m), weight 48.7 kg (107 lb 6.4 oz), SpO2 97 %. No results found. No results for input(s): WBC, HGB, HCT, PLT in the last 72 hours.  Recent Labs  01/11/17 0405  NA 136  K 4.0  CL 96*  GLUCOSE 98  BUN 60*  CREATININE 14.22*  CALCIUM 7.1*   CBG (last 3)  No results for input(s): GLUCAP in the last 72 hours.  Wt Readings from Last 3 Encounters:  01/13/17 48.7 kg (107 lb 6.4 oz)  01/04/17 50.7 kg (111 lb 12.4 oz)  09/04/16 54.4 kg (120 lb)    Physical Exam:  BP (!) 162/88 (BP Location: Right Wrist)   Pulse 66   Temp 98.5 F (36.9 C) (Oral)   Resp 16   Ht '5\' 5"'  (1.651 m)   Wt 48.7 kg (107 lb 6.4 oz)   SpO2 97%   BMI 17.87 kg/m  Constitutional: She appears well-developed. Frail  HENT: Normocephalic and atraumatic.  Eyes: EOMI. No discharge.  Cardiovascular: RRR. No JVD. Respiratory: CTA Bilaterally. Normal effort .  GI: Soft. Bowel sounds are normal.   Musculoskeletal: She exhibits no edema or tenderness.  Neurological: She is alert.  Fair awareness of deficits.  Motor: RUE/RLE: 5/5 LUE/LLE: 0/5 No increase in tone Skin: Skin is warm and dry.  Psychiatric: Her behavior is normal. Her affect is blunt. She does not engage much   Assessment/Plan: 1. Functional deficits secondary to right frontal hemorrhage, small SAH and signs of PRES which require 3+ hours per day of interdisciplinary therapy in a comprehensive inpatient rehab setting. Physiatrist is providing close team supervision and 24 hour management of active medical problems listed below. Physiatrist and rehab team  continue to assess barriers to discharge/monitor patient progress toward functional and medical goals.  Function:  Bathing Bathing position   Position: Shower  Bathing parts Body parts bathed by patient: Left arm, Chest, Abdomen, Front perineal area, Right upper leg, Left upper leg, Buttocks, Right lower leg Body parts bathed by helper: Left lower leg, Right arm  Bathing assist Assist Level: Touching or steadying assistance(Pt > 75%)      Upper Body Dressing/Undressing Upper body dressing   What is the patient wearing?: Pull over shirt/dress     Pull over shirt/dress - Perfomed by patient: Thread/unthread right sleeve, Thread/unthread left sleeve, Put head through opening Pull over shirt/dress - Perfomed by helper: Pull shirt over trunk        Upper body assist Assist Level: Touching or steadying assistance(Pt > 75%)      Lower Body Dressing/Undressing Lower body dressing   What is the patient wearing?: Underwear, Pants, Non-skid slipper socks Underwear - Performed by patient: Thread/unthread right underwear leg Underwear - Performed by helper: Thread/unthread left underwear leg, Pull underwear up/down Pants- Performed by patient: Thread/unthread right pants leg Pants- Performed by helper: Thread/unthread left pants leg, Pull pants up/down Non-skid slipper socks- Performed by patient: Don/doff right sock Non-skid slipper socks- Performed by helper: Don/doff left sock Socks - Performed by patient: Don/doff right sock Socks - Performed by helper: Don/doff left sock Shoes - Performed by patient: Don/doff  left shoe, Fasten right, Don/doff right shoe Shoes - Performed by helper: Don/doff left shoe, Fasten right, Don/doff right shoe          Lower body assist Assist for lower body dressing: Touching or steadying assistance (Pt > 75%)      Toileting Toileting Toileting activity did not occur: No continent bowel/bladder event Toileting steps completed by patient: Performs  perineal hygiene Toileting steps completed by helper: Adjust clothing prior to toileting, Performs perineal hygiene, Adjust clothing after toileting Toileting Assistive Devices: Grab bar or rail  Toileting assist Assist level: Touching or steadying assistance (Pt.75%)   Transfers Chair/bed transfer   Chair/bed transfer method: Squat pivot Chair/bed transfer assist level: Moderate assist (Pt 50 - 74%/lift or lower) Chair/bed transfer assistive device: Armrests     Locomotion Ambulation     Max distance: 18 Assist level: Maximal assist (Pt 25 - 49%)   Wheelchair   Type: Manual Max wheelchair distance: 150 Assist Level: Touching or steadying assistance (Pt > 75%)  Cognition Comprehension Comprehension assist level: Understands basic 90% of the time/cues < 10% of the time  Expression Expression assist level: Expresses basic 90% of the time/requires cueing < 10% of the time.  Social Interaction Social Interaction assist level: Interacts appropriately 90% of the time - Needs monitoring or encouragement for participation or interaction.  Problem Solving Problem solving assist level: Solves basic 90% of the time/requires cueing < 10% of the time  Memory Memory assist level: Recognizes or recalls 75 - 89% of the time/requires cueing 10 - 24% of the time    Medical Problem List and Plan: 1.  Decreased functional mobility secondary to right frontal hemorrhage, small SAH and signs of PRES  Cont CIR  PRAFO/WHO   Fluoxetine started 7/3, increased to 20 on 7/10 Team conference today please see physician documentation under team conference tab, met with team face-to-face to discuss problems,progress, and goals. Formulized individual treatment plan based on medical history, underlying problem and comorbidities. 2.  DVT Prophylaxis/Anticoagulation: SCDs.   Vascular study neg 3. Pain Management: Tylenol as needed 4. Mood: Provide emotional support 5. Neuropsych: This patient is not fully capable  of making decisions on her own behalf. 6. Skin/Wound Care: Routine skin checks 7. Fluids/Electrolytes/Nutrition: Routine I&Os 8. Seizure prophylaxis. Keppra 500 mg every 12 hours 9. ESRD with history of right renal cancer status post nephrectomy. Continue peritoneal dialysis as per renal services 10. Hypertension. At home Norvasc 5 mg twice daily, Coreg 12.5 mg twice a day, Cozaar 50 mg daily, currently just on coreg systolics running a little higher in am, need to start Norvasc   Vitals:   01/12/17 1830 01/13/17 0500  BP: (!) 148/86 (!) 162/88  Pulse: 70 66  Resp: 16 16  Temp: 98.5 F (36.9 C) 98.5 F (36.9 C)   11. Hypothyroidism. Synthroid 12.Constipation. Laxative assistance, lat recorded BM, senna S 1 BID    LOS (Days) 9 A FACE TO FACE EVALUATION WAS PERFORMED  KIRSTEINS,ANDREW E 01/13/2017 7:21 AM

## 2017-01-13 NOTE — Patient Care Conference (Signed)
Inpatient RehabilitationTeam Conference and Plan of Care Update Date: 01/13/2017   Time: 11:15 AM    Patient Name: Terry Juarez      Medical Record Number: [id number]  Date of Birth: 1965/12/28 Sex: Female         Room/Bed: 4M01C/4M01C-01 Payor Info: Payor: BLUE CROSS BLUE SHIELD / Plan: BCBS OTHER / Product Type: *No Product type* /    Admitting Diagnosis: Rt Frontal Hemorr hage  Admit Date/Time:  01/04/2017  6:41 PM Admission Comments: No comment available   Primary Diagnosis:  <principal problem not specified> Principal Problem: <principal problem not specified>  Patient Active Problem List   Diagnosis Date Noted  . Flaccid hemiplegia of left nondominant side as late effect of nontraumatic intraparenchymal hemorrhage of brain ([place])   . ESRD on dialysis ([place])   . Leukocytosis   . Hyperglycemia   . Hypertensive emergency 01/04/2017  . Acute left hemiparesis ([place]) 01/04/2017  . Intracranial hemorrhage ([place]) 01/04/2017  . Subarachnoid hematoma ([place])   . Hypothyroidism   . Benign essential HTN   . Seizure prophylaxis   . Cardiac arrest, cause unspecified ([place])   . Acute respiratory failure with hypoxemia ([place])   . Acute encephalopathy   . ICH (intracerebral hemorrhage) ([place])   . Seizure ([place])   . PRES (posterior reversible encephalopathy syndrome)   . Subarachnoid hemorrhage 12/19/2016  . Symptomatic anemia 07/29/2016  . Cough 01/09/2016  . Elevated troponin 02/12/2015  . Headache 02/12/2015  . Syncope 02/12/2015  . ESRD on peritoneal dialysis ([place]) 02/12/2015  . Hypertension   . Hyperlipidemia   . Anemia   . Essential hypertension   . Cephalalgia   . Right renal mass 07/25/2014    Expected Discharge Date: Expected Discharge Date: 02/02/17  Team Members Present: Physician leading conference: Dr. Alysia Penna Social Worker Present: Alfonse Alpers, LCSW Nurse Present: Other (comment) Marissa Nestle, RN) PT Present: Other (comment) Leavy Cella, PT) OT Present:  Cherylynn Ridges, OT SLP Present: Weston Anna, SLP PPS Coordinator present : Daiva Nakayama, RN, CRRN     Current Status/Progress Goal Weekly Team Focus  Medical   improving with hemi techniques and standing some L knee buckling, some estim  Improve BP, shoulder subluxation      Bowel/Bladder   patient is anuric, on peritoneal dialysis, continent of bowel with moderate assistance, last bowel movement [id number]  to continue to be continent   Assess needs for BR prn   Swallow/Nutrition/ Hydration             ADL's   Min/Mod A overall self-care/transfers, improved postural control  Supervision overall  Sitting balance, L UE NMR, Modified bathing/dressing,    Mobility   mod assist bed mobility, mod assist squat pivot transfer, max assist amb with hemi walker  supervision bed mobility, w/c, & transfers. Min A for ambulation   posture, balance, endurance progression, transfers, bed mobility, gait.    Communication             Safety/Cognition/ Behavioral Observations  Supervision  Supervision  recall and complex problem solving    Pain   patient denies pain and discomfort  keep pain under control  Assess for pain q shift and prn   Skin   no skin issues noted, skin dry and intact, dressing to peritoneal dialysis site clean, dry and intact  keep skin dry and intact with no infection or breakdown  assess skin every shift and prn    Rehab Goals Patient on target to meet  rehab goals: Yes Rehab Goals Revised: none *See Care Plan and progress notes for long and short-term goals.     Barriers to Discharge  Current Status/Progress Possible Resolutions Date Resolved   Physician    Inaccessible home environment;Medical stability     bowel movement yesterday   planning move to another home with family        Nursing                    PT                      [place] home environment;Other (comments) Brother-in-law working on having ramp  built.  Family will need to be trained on how to do pt's PD at home. Family working on these issues.          Discharge Planning/Teaching Needs:  Pt to d/c to her brother-in-law and sister-in-law's home in [place], [place].  Family to come for family training closer to d/c.  They will also need to go through PD training closer to home.   Team Discussion:  MD is managing and adjusting HTN medications.  Pt is progressing with therapies and OT is doing estim with pt to her left shoulder for subluxation.  PT is trying brace, as left knee is buckling.  Pt is min A for bed mobility and mod A for tx.  She has walked up to 34' with hemi-walker with max A.  ST was working on higher level cognition tasks and pt is doing well and may not need ST much longer so may d/c ST soon.  Pt with BM 01-12-17.  Revisions to Treatment Plan:  none    Continued Need for Acute Rehabilitation Level of Care: The patient requires daily medical management by a physician with specialized training in physical medicine and rehabilitation for the following conditions: Daily direction of a multidisciplinary physical rehabilitation program to ensure safe treatment while eliciting the highest outcome that is of practical value to the patient.: Yes Daily medical management of patient stability for increased activity during participation in an intensive rehabilitation regime.: Yes Daily analysis of laboratory values and/or radiology reports with any subsequent need for medication adjustment of medical intervention for : Neurological problems;Blood pressure problems  [name], [name] 01/13/2017, 2:31 PM

## 2017-01-13 NOTE — Plan of Care (Signed)
Problem: RH BOWEL ELIMINATION Goal: RH STG MANAGE BOWEL WITH ASSISTANCE STG Manage Bowel with Mod Assistance.  Outcome: Progressing Very weal Goal: RH OTHER STG BOWEL ELIMINATION GOALS W/ASSIST Other STG Bowel Elimination Goals With Mod Assistance.  Outcome: Progressing Bowel movement 01/12/17  Problem: RH SKIN INTEGRITY Goal: RH STG SKIN FREE OF INFECTION/BREAKDOWN Mod I  Outcome: Progressing No skin infection noted Goal: RH OTHER STG SKIN INTEGRITY GOALS W/ASSIST Other STG Skin Integrity Goals With Mod Assistance.  Outcome: Progressing No skin isues noted  Problem: RH SAFETY Goal: RH STG ADHERE TO SAFETY PRECAUTIONS W/ASSISTANCE/DEVICE STG Adhere to Safety Precautions With Mod Assistance/Device.  Outcome: Progressing Safety precautions maintained

## 2017-01-14 ENCOUNTER — Inpatient Hospital Stay (HOSPITAL_COMMUNITY): Payer: BLUE CROSS/BLUE SHIELD | Admitting: Occupational Therapy

## 2017-01-14 ENCOUNTER — Inpatient Hospital Stay (HOSPITAL_COMMUNITY): Payer: BLUE CROSS/BLUE SHIELD | Admitting: Physical Therapy

## 2017-01-14 ENCOUNTER — Inpatient Hospital Stay (HOSPITAL_COMMUNITY): Payer: BLUE CROSS/BLUE SHIELD

## 2017-01-14 ENCOUNTER — Inpatient Hospital Stay (HOSPITAL_COMMUNITY): Payer: BLUE CROSS/BLUE SHIELD | Admitting: Speech Pathology

## 2017-01-14 MED ORDER — HEPARIN 1000 UNIT/ML FOR PERITONEAL DIALYSIS
500.0000 [IU] | INTRAMUSCULAR | Status: DC | PRN
  Filled 2017-01-14: qty 0.5

## 2017-01-14 MED ORDER — GENTAMICIN SULFATE 0.1 % EX CREA
1.0000 "application " | TOPICAL_CREAM | Freq: Every day | CUTANEOUS | Status: DC
  Administered 2017-01-14 – 2017-02-01 (×10): 1 via TOPICAL
  Filled 2017-01-14: qty 15

## 2017-01-14 NOTE — Progress Notes (Signed)
Physical Therapy Session Note  Patient Details  Name: Terry Juarez MRN: [id number] Date of Birth: [birth year]  Today's Date: 01/14/2017 PT Individual Time: 1100-1200 PT Individual Time Calculation (min): 60 min   Short Term Goals: Week 1:  PT Short Term Goal 1 (Week 1): Pt will transfer from bed to w/c with moderate assist in either direction  PT Short Term Goal 2 (Week 1): Pt will perform all bed mobility with min assist PT Short Term Goal 3 (Week 1): Pt will demonstrate dynamic standing balance for functional tasks requiring moderate assistance PT Short Term Goal 4 (Week 1): Pt will propel w/c 50 ft in a controlled environment with supervision      Skilled Therapeutic Interventions/Progress Updates:    Functional bed mobility to come to EOB with min assist for advancement of LLE and cues for attention to LUE. Donned socks, shoes and AFO (still not fitting correctly in the new shoes - will need further assessment) to prepare for session with [employer] Robot training. Pt completed transfer into w/c and transported total assist to robot set-up. Focused on NMR for LUE use of robot in various positions (standing, tall kneeling, and seated) to also address postural control, balance, and muscle activation in trunk and BLE in these positions. In standing, pt required facilitation at LLE for knee extension and to assist with postural control due to demands of LUE use by robot pt with tendency for LOB to the L and required facilitation for weigthshift. Pt complete 1st 40 reps in this position. In tall kneeling, requires verbal and tactile cues for hip extension and overall min assist while engaging with robot. Robot for second 40 reps. Due to fatigue, completed the rest of the total 432 reps in seated position with cues for upright posture. Pt able to feel herself initiate LUE movement in seated position and demonstrated improved ability to cross midline to the R. Progressed from regular targets to  randomized targets to increase challenge and pt did not require increased assist from robot to complete.   Therapy Documentation Precautions:  Precautions Precautions: Fall Precaution Comments: Peritoneal dialysis patient; L subluxed shoulder Restrictions Weight Bearing Restrictions: No  Pain:  Denies pain.    See Function Navigator for Current Functional Status.   Therapy/Group: Individual Therapy  Canary Brim Ivory Broad, PT, DPT  01/14/2017, 12:16 PM

## 2017-01-14 NOTE — Progress Notes (Signed)
Occupational Therapy Session Note  Patient Details  Name: Terry Juarez MRN: [id number] Date of Birth: 1966-02-25  Today's Date: 01/13/2017  Late entries  OT Individual Time:  -  1130-1200 (64min) 2nd session: 14:00-14:45 (44min)      Skilled Therapeutic Interventions/Progress Updates:   1:1 NMR with focus on use of e-stim for left forearm to illicit movement distally in extensors/flexors coupled with activating shoulder/scapular and bicep/tricep with functional reach with mod A for support against gravity.   2nd session 1:1 Continued focus on NMR for left UE including performing PNF D1 and D2 in supine and in sitting. Pt able to "turn on" mm tone  In shoulder/ scapular & bicep/tricep as well as be able to relax mm tone with abduction movements!!! Demonstrating Brunstrom II-III. Bed mobility for optimal positioning for ROM for UE with min A to encourage normal patterns of movement. Left pt with next therapist in the w/c.   (Pt does need A for donning AFO and left shoe).     Therapy Documentation Precautions:  Precautions Precautions: Fall Precaution Comments: Peritoneal dialysis patient; L subluxed shoulder Restrictions Weight Bearing Restrictions: No Pain:  no c/o pain in either session.   See Function Navigator for Current Functional Status.   Therapy/Group: Individual Therapy  W[facility] 01/14/2017, 7:49 AM

## 2017-01-14 NOTE — Progress Notes (Signed)
PHYSICAL MEDICINE & REHABILITATION     PROGRESS NOTE  Subjective/Complaints:   No issues overnite, constipated and willing to increase laxative Was on 3 BP meds at home but doesn't remember dosing  ROS: Denies CP, SOB, N/V/D.  Objective: Vital Signs: Blood pressure 136/83, pulse 72, temperature 98.4 F (36.9 C), temperature source Oral, resp. rate 16, height 5\' 5"  (1.651 m), weight 50 kg (110 lb 3.2 oz), SpO2 98 %. No results found. No results for input(s): WBC, HGB, HCT, PLT in the last 72 hours. No results for input(s): NA, K, CL, GLUCOSE, BUN, CREATININE, CALCIUM in the last 72 hours.  Invalid input(s): CO CBG (last 3)  No results for input(s): GLUCAP in the last 72 hours.  Wt Readings from Last 3 Encounters:  01/14/17 50 kg (110 lb 3.2 oz)  01/04/17 50.7 kg (111 lb 12.4 oz)  09/04/16 54.4 kg (120 lb)    Physical Exam:  BP 136/83 (BP Location: Left Wrist)   Pulse 72   Temp 98.4 F (36.9 C) (Oral)   Resp 16   Ht 5\' 5"  (1.651 m)   Wt 50 kg (110 lb 3.2 oz)   SpO2 98%   BMI 18.34 kg/m  Constitutional: She appears well-developed. Frail  HENT: Normocephalic and atraumatic.  Eyes: EOMI. No discharge.  Cardiovascular: RRR. No JVD. Respiratory: CTA Bilaterally. Normal effort .  GI: Soft. Bowel sounds are normal.   Musculoskeletal: She exhibits no edema or tenderness.  Neurological: She is alert.  Fair awareness of deficits.  Motor: RUE/RLE: 5/5 LUE/LLE: 0/5 No increase in tone Skin: Skin is warm and dry.  Psychiatric: Her behavior is normal. Her affect is blunt. She does not engage much   Assessment/Plan: 1. Functional deficits secondary to right frontal hemorrhage, small SAH and signs of PRES which require 3+ hours per day of interdisciplinary therapy in a comprehensive inpatient rehab setting. Physiatrist is providing close team supervision and 24 hour management of active medical problems listed below. Physiatrist and rehab team continue to assess  barriers to discharge/monitor patient progress toward functional and medical goals.  Function:  Bathing Bathing position   Position: Shower  Bathing parts Body parts bathed by patient: Left arm, Chest, Abdomen, Front perineal area, Right upper leg, Left upper leg, Buttocks, Right lower leg Body parts bathed by helper: Left lower leg, Right arm  Bathing assist Assist Level: Touching or steadying assistance(Pt > 75%)      Upper Body Dressing/Undressing Upper body dressing   What is the patient wearing?: Pull over shirt/dress     Pull over shirt/dress - Perfomed by patient: Thread/unthread right sleeve, Thread/unthread left sleeve, Put head through opening Pull over shirt/dress - Perfomed by helper: Pull shirt over trunk        Upper body assist Assist Level: Touching or steadying assistance(Pt > 75%)      Lower Body Dressing/Undressing Lower body dressing   What is the patient wearing?: Pants, Underwear, [employer], Shoes Underwear - Performed by patient: Thread/unthread right underwear leg, Thread/unthread left underwear leg Underwear - Performed by helper: Pull underwear up/down Pants- Performed by patient: Thread/unthread right pants leg Pants- Performed by helper: Thread/unthread left pants leg, Pull pants up/down Non-skid slipper socks- Performed by patient: Don/doff right sock Non-skid slipper socks- Performed by helper: Don/doff left sock Socks - Performed by patient: Don/doff right sock Socks - Performed by helper: Don/doff left sock Shoes - Performed by patient: Don/doff right shoe, Fasten right Shoes - Performed by helper: Don/doff left  shoe, Fasten left     TED Hose - Performed by patient: Don/doff right TED hose TED Hose - Performed by helper: Don/doff left TED hose  Lower body assist Assist for lower body dressing: Touching or steadying assistance (Pt > 75%)      Toileting Toileting Toileting activity did not occur: No continent bowel/bladder event Toileting  steps completed by patient: Performs perineal hygiene Toileting steps completed by helper: Adjust clothing prior to toileting, Performs perineal hygiene, Adjust clothing after toileting Toileting Assistive Devices: Grab bar or rail  Toileting assist Assist level: Touching or steadying assistance (Pt.75%)   Transfers Chair/bed transfer   Chair/bed transfer method: Squat pivot Chair/bed transfer assist level: Moderate assist (Pt 50 - 74%/lift or lower) Chair/bed transfer assistive device: Armrests     Locomotion Ambulation     Max distance: 18 Assist level: Maximal assist (Pt 25 - 49%)   Wheelchair   Type: Manual Max wheelchair distance: 150 Assist Level: Touching or steadying assistance (Pt > 75%)  Cognition Comprehension Comprehension assist level: Understands basic 90% of the time/cues < 10% of the time  Expression Expression assist level: Expresses basic 90% of the time/requires cueing < 10% of the time.  Social Interaction Social Interaction assist level: Interacts appropriately 90% of the time - Needs monitoring or encouragement for participation or interaction.  Problem Solving Problem solving assist level: Solves complex 90% of the time/cues < 10% of the time  Memory Memory assist level: Recognizes or recalls 90% of the time/requires cueing < 10% of the time    Medical Problem List and Plan: 1.  Decreased functional mobility secondary to right frontal hemorrhage, small SAH and signs of PRES  Per conference 7/11 progressing toward goals with target D/C 7/31  PRAFO/WHO   Fluoxetine started 7/3, increased to 20 on 7/10  2.  DVT Prophylaxis/Anticoagulation: SCDs.   Vascular study neg 3. Pain Management: Tylenol as needed 4. Mood: Provide emotional support 5. Neuropsych: This patient is not fully capable of making decisions on her own behalf. 6. Skin/Wound Care: Routine skin checks 7. Fluids/Electrolytes/Nutrition: Routine I&Os 8. Seizure prophylaxis. Keppra 500 mg every  12 hours 9. ESRD with history of right renal cancer status post nephrectomy. Continue peritoneal dialysis as per renal services 10. Hypertension. At home Norvasc 5 mg twice daily, Coreg 12.5 mg twice a day, Cozaar 50 mg daily, currently just on coreg systolics running a little higher in am, amlodipine increased from 5- > 10mg  per Nephro on 7/11, monitor   Vitals:   01/13/17 1815 01/14/17 0500  BP: 136/83   Pulse: 69 72  Resp: 16 16  Temp: 98.4 F (36.9 C) 98.4 F (36.9 C)   11. Hypothyroidism. Synthroid 12.Constipation. Laxative assistance, lat recorded BM, senna S 1 BID- BM 7/11    LOS (Days) 10 A FACE TO FACE EVALUATION WAS PERFORMED  Terry Juarez 01/14/2017 6:21 AM

## 2017-01-14 NOTE — Progress Notes (Signed)
Patient rested well throughout the night.  Patient denies needs/concerns at this time.

## 2017-01-14 NOTE — Progress Notes (Signed)
Physical Therapy Weekly Progress Note  Patient Details  Name: Terry Juarez MRN: [id number] Date of Birth: 1965-10-20  Beginning of progress report period: January 05, 2017 End of progress report period: January 14, 2017  Today's Date: 01/14/2017 PT Individual Time: 2542-7062 PT Individual Time Calculation (min): 30 min   Patient has met 3 of 4 short term goals.  Pt has made excellent progress this reporting period and is currently performing transfers with consistent mod assist and gait training with HW with max assist.  Patient continues to demonstrate the following deficits muscle weakness, impaired timing and sequencing, abnormal tone, unbalanced muscle activation, decreased coordination and decreased motor planning, decreased attention to left and decreased sitting balance, decreased standing balance, decreased postural control, hemiplegia and decreased balance strategies and therefore will continue to benefit from skilled PT intervention to increase functional independence with mobility.  Patient progressing toward long term goals..  Continue plan of care.  PT Short Term Goals Week 1:  PT Short Term Goal 1 (Week 1): Pt will transfer from bed to w/c with moderate assist in either direction  PT Short Term Goal 1 - Progress (Week 1): Met PT Short Term Goal 2 (Week 1): Pt will perform all bed mobility with min assist PT Short Term Goal 2 - Progress (Week 1): Met PT Short Term Goal 3 (Week 1): Pt will demonstrate dynamic standing balance for functional tasks requiring moderate assistance PT Short Term Goal 3 - Progress (Week 1): Not met PT Short Term Goal 4 (Week 1): Pt will propel w/c 50 ft in a controlled environment with supervision  PT Short Term Goal 4 - Progress (Week 1): Met Week 2:  PT Short Term Goal 1 (Week 2): Pt will ambulate with LRAD x50' with max assist PT Short Term Goal 2 (Week 2): Pt will transfer with consistent min assist PT Short Term Goal 3 (Week 2): Pt will initiate stair  training with PT PT Short Term Goal 4 (Week 2): Pt will demo static standing balance with RUE support with min assist   Skilled Therapeutic Interventions/Progress Updates:    no c/o pain.  Session focus on orthotics consult and NMR.    Pt participated in orthotics consult for GRAFO for LLE to improve L foot drop and L knee stability in stance phase.  Gait x40' with hemiwalker with max assist to facilitate LLE advance/place/stabilize with mod multimodal cues for L knee extension in stance phase and upright posture.  Pt continues to demo some flexor tone in swing phase on LLE.  NMR in tall kneeling for terminal hip extension x10 reps with multimodal cues for form and pacing.  Pt returned to w/c at end of session and propelled back to room with supervision.  Transfer back to bed with mod assist and pt positioned supine with call bell in reach and needs met.   Therapy Documentation Precautions:  Precautions Precautions: Fall Precaution Comments: Peritoneal dialysis patient; L subluxed shoulder Restrictions Weight Bearing Restrictions: No   See Function Navigator for Current Functional Status.  Therapy/Group: Individual Therapy  Earnest Conroy Penven-Crew 01/14/2017, 4:29 PM

## 2017-01-14 NOTE — Progress Notes (Signed)
Physical Therapy Session Note  Patient Details  Name: Terry Juarez MRN: [id number] Date of Birth: 12-02-1965  Today's Date: 01/13/2017 PT Individual Time: 1445-1515 PT Individual Time Calculation (min): 45 min   Short Term Goals: Week 1:  PT Short Term Goal 1 (Week 1): Pt will transfer from bed to w/c with moderate assist in either direction  PT Short Term Goal 2 (Week 1): Pt will perform all bed mobility with min assist PT Short Term Goal 3 (Week 1): Pt will demonstrate dynamic standing balance for functional tasks requiring moderate assistance PT Short Term Goal 4 (Week 1): Pt will propel w/c 50 ft in a controlled environment with supervision   Skilled Therapeutic Interventions/Progress Updates:    no c/o of pain. PT treatment session focused on NMR of the L LE during functional activities of gait, step ups and kneeling.  Pt sitting in w/c upon arrival, agreeable to PT treatment session. PT rolled pt in w/c to/from therapy gym for energy conservation. Pt wearing a ground reaction force AFO on the L LE during treatment to assist with knee extension. Pt ambulated 3x75f using hemiwalker in R UE with mod-max assist for trunk control with student PT providing facilitation of the L LE during swing and stance phase for NMR and PT providing correct L foot placement during stance to avoid external or internal rotation at the hip. PT provided facilitation of L hip and knee flexion during swing phase and facilitation at the glutes and quads during stance phase for improved hip and knee extension with pt education to focus on timing of weight shift with the facilitation. Pt performed L LE lateral step ups onto 3inch stair 2x15 with B UEs on hand rail with mod-max assist and PT providing assist with L UE placement throughout. PT provided verbal cues for pushing through L LE with tactile and manual facilitation at L glutes and quads for hip and knee extension to push up onto stair for training NMR  functionally. Pt gets into tall kneeling on mat table with mod assist of 2 for trunk control and lifting L LE. Pt performed x8 short to tall kneeling with B UE support on bench and mod assist for lifting to work on [place] hip extension. PT provided verbal cues and tactile cues at glutes to facilitate hip extension. Pt gets off mat table with mod assist of 2 for weight shifting and lifting L LE. Pt returned to room and performed squat pivot transfer w/c to bed with min assist due to fatigue. Pt performed sit to supine with mod assist for L LE lifting into bed. Pt left supine in bed with needs met and call bell in reach.     Therapy Documentation Precautions:  Precautions Precautions: Fall Precaution Comments: Peritoneal dialysis patient; L subluxed shoulder Restrictions Weight Bearing Restrictions: No   See Function Navigator for Current Functional Status.   Therapy/Group: Individual Therapy  Crystalynn Mcinerney 01/14/2017, 12:24 PM

## 2017-01-14 NOTE — Progress Notes (Signed)
[facility] Progress Note   Subjective: doing well.  WT's and BP's up.     Objective Vitals:   01/13/17 0940 01/13/17 1500 01/13/17 1815 01/14/17 0500  BP: (!) 148/91 (!) 153/91 136/83 (!) 155/86  Pulse: 67 67 69 72  Resp:  16 16 16   Temp:  98.6 F (37 C) 98.4 F (36.9 C) 98.4 F (36.9 C)  TempSrc:  Oral Oral Oral  SpO2:  98%  98%  Weight:   50.1 kg (110 lb 7.2 oz) 50 kg (110 lb 3.2 oz)  Height:       Physical Exam General: slender AAF NAD  Good spirits Regular S1S2 No S3 Lungs clear Abdomen: soft NT No LE edema Left hemiparesis. PD cath with clear dry dressing R abdomen Right upper arm AVF aneurysmal/+ bruit   Dialysis:PD 5 exchanges overnight, no day bag, no pause, 2000 volume, using all 1.5%- UF of 1.5 liters   Summary: Pt is a 51 y.o.yo femalewith ESRD on PD who was admitted on 6/16/2018with sz/hemiparesis- found to have right sided frontal lobe hemorrhage, alsowith PRES. Seizures controlled in [place]. Had cardiac arrest 12/25/16 in Inpt HD. Transferred to Rehab 01/04/17.  Assessment: 1. Intracranial CVA/ small SAH R frontal, + PRES with L hemiparesis - pt was recovering then cardiac arrest at inpt dialysis on 12/25/17; then transitioned back to PD- making progress again. 2. ESRD- Continue CCPD.  We spoke w/ Tedd Sias (family in [place]) who says that he has committed to learning and doing PD for Ms Green for a 6 month period after she is discharged.  Spoke w/ OP PD staff who said that they are working w/ Mr Abate and that the next decision is for him to choose a PD clinic in that part of the state; they have narrowed it down to two as of 7/9.   3. HTN/volume - bp's and wt's are up, will ^UF with PD  4. Cardiac arrest - while on HD 6/22. Refuses to ever consider HD again.  5. Anemia- history of GIB and low hgb. Responded toowell to ESA and and Hb now too high (>15). Holding ESA now 6. MBD - Had hypercalcemia Ca 10.9 on 7/2.   Stopped  calcium acetate but the became hypocalcemic (7.2) so resumed Ca acetate and decreased sensipar from 60/d to 30/d.  7. Nutrition Alb 2.7 - regular diet ok, has Nepro and vitamins    Plan - cont PD , ^UF  Recent Labs Lab 01/09/17 0447 01/10/17 0524 01/11/17 0405  NA 134* 135 136  K 3.6 3.8 4.0  CL 96* 95* 96*  CO2 24 24 24   GLUCOSE 110* 102* 98  BUN 62* 61* 60*  CREATININE 12.87* 13.27* 14.22*  CALCIUM 7.2* 7.2* 7.1*  PHOS 5.0* 5.1* 5.8*     Recent Labs Lab 01/09/17 0447 01/10/17 0524 01/11/17 0405  ALBUMIN 2.7* 2.7* 2.7*     Recent Labs Lab 01/08/17 0612  WBC 8.3  HGB 15.4*  HCT 49.1*  MCV 101.0*  PLT 262    Lab Results  Component Value Date   INR 0.93 12/21/2016   INR 1.88 12/19/2016   INR 0.97 12/19/2016    Medications:  . amLODipine  10 mg Oral Daily  . calcium acetate  667 mg Oral TID WC  . carvedilol  12.5 mg Oral BID WC  . cinacalcet  30 mg Oral Q supper  . famotidine  20 mg Oral Daily  . feeding supplement (NEPRO CARB STEADY)  237  mL Oral BID BM  . FLUoxetine  20 mg Oral Daily  . gentamicin cream  1 application Topical Daily  . levETIRAcetam  500 mg Oral BID  . levothyroxine  88 mcg Oral QAC breakfast  . multivitamin  1 tablet Oral Daily  . pantoprazole  40 mg Oral BID  . polyethylene glycol  17 g Oral Daily  . senna-docusate  2 tablet Oral BID

## 2017-01-14 NOTE — Progress Notes (Signed)
Occupational Therapy Session Note  Patient Details  Name: Terry Juarez MRN: [id number] Date of Birth: Sep 27, 1965  Today's Date: 01/14/2017 OT Individual Time: 1304-1400 OT Individual Time Calculation (min): 56 min    Short Term Goals: Week 2:  OT Short Term Goal 1 (Week 2): Pt will complete toilet transfers with consistent min A OT Short Term Goal 2 (Week 2): Pt will utilize L UE as a stabilizer with min questioning cues OT Short Term Goal 3 (Week 2): Pt will complete LB dressing with consistent min A  Skilled Therapeutic Interventions/Progress Updates:    OT treatment session focused on modified bathing/dressing, L NMR, and sitting/standing balance. Pt propelled wc to dresser drawers to collect clothing at wc level- VC for wc positioning to allo access to drawers. Pt's PD dressing site covered, then pt completed squat-pivot transfer into shower with mod A. Provided pt with bath glove and incorporated NMR weight bearing techniques to wash upper legs and R arm. Pt transferred out of shower to L with Mod A to stand and max A to pivot. Min A for UB dressing using hemi-techiques. Pt abel to position LLE today into figure 4 position without assistance, then able to thread pant legs and don sock on LLE. Mod A sit<>stand w/ L knee block and assistance to pull pants over hips. Pt left seated in wc at end of session with needs met and call bell in reach.   Therapy Documentation Precautions:  Precautions Precautions: Fall Precaution Comments: Peritoneal dialysis patient; L subluxed shoulder Restrictions Weight Bearing Restrictions: No Pain: Pain Assessment Pain Assessment: No/denies pain Pain Score: 0-No pain ADL: ADL ADL Comments: refer to functional navigator See Function Navigator for Current Functional Status.   Therapy/Group: Individual Therapy  [name] 01/14/2017, 2:05 PM

## 2017-01-14 NOTE — Progress Notes (Signed)
Speech Language Pathology Session Note & Discharge Summary  Patient Details  Name: Terry Juarez MRN: [id number] Date of Birth: 03/03/1966  Today's Date: 01/14/2017 SLP Individual Time: 1400-1440 SLP Individual Time Calculation (min): 40 min   Skilled Therapeutic Interventions: Skilled treatment session focused on cognitive goals. Patient was re-administered the MoCA (version 7.3) and scored 27/30 points with a score of 26 or above considered normal. Patient's score improved by 5 points since initial assessment on 01/05/17. Suspect patient is at her baseline level of cognitive functioning, therefore, patient will be discharged from skilled SLP intervention. Patient verbalized understanding and agreement.   Patient has met 4 of 4 long term goals.  Patient to discharge at overall Modified Independent;Supervision level.   Reasons goals not met: N/A   Clinical Impression/Discharge Summary: Patient has made excellent gains and has met 4 of 4 LTG's this admission. Currently, patient is overall Mod I-Supervision to complete functional and familiar tasks safely in regards to complex problem solving, recall, awareness and attention. Patient education complete. Suspect patient is at her cognitive baseline, therefore, patient will be discharged from skilled SLP intervention.   Recommendation:  None      Equipment: N/A   Reasons for discharge: Treatment goals met   Patient/Family Agrees with Progress Made and Goals Achieved: Yes   Function:  Cognition Comprehension Comprehension assist level: Follows complex conversation/direction with extra time/assistive device  Expression   Expression assist level: Expresses complex ideas: With extra time/assistive device  Social Interaction Social Interaction assist level: Interacts appropriately with others with medication or extra time (anti-anxiety, antidepressant).  Problem Solving Problem solving assist level: Solves complex problems: With extra time   Memory Memory assist level: Requires cues to use assistive device   PAYNE, COURTNEY 01/14/2017, 3:16 PM    

## 2017-01-15 ENCOUNTER — Inpatient Hospital Stay (HOSPITAL_COMMUNITY): Payer: BLUE CROSS/BLUE SHIELD | Admitting: Occupational Therapy

## 2017-01-15 ENCOUNTER — Inpatient Hospital Stay (HOSPITAL_COMMUNITY): Payer: BLUE CROSS/BLUE SHIELD | Admitting: Physical Therapy

## 2017-01-15 DIAGNOSIS — R269 Unspecified abnormalities of gait and mobility: Secondary | ICD-10-CM

## 2017-01-15 DIAGNOSIS — I69398 Other sequelae of cerebral infarction: Secondary | ICD-10-CM

## 2017-01-15 NOTE — Progress Notes (Addendum)
Occupational Therapy Session Note  Patient Details  Name: Terry Juarez MRN: [id number] Date of Birth: [birth year]  Today's Date: 01/15/2017  Session 1 OT Individual Time: 0900-1000 OT Individual Time Calculation (min): 60 min   Session 2 OT Individual Time: 1400-1500 OT Individual Time Calculation (min): 60 min    Short Term Goals: Week 2:  OT Short Term Goal 1 (Week 2): Pt will complete toilet transfers with consistent min A OT Short Term Goal 2 (Week 2): Pt will utilize L UE as a stabilizer with min questioning cues OT Short Term Goal 3 (Week 2): Pt will complete LB dressing with consistent min A  Skilled Therapeutic Interventions/Progress Updates:    Session 1 Educated pt on modified techniques for bed level LB dressing as an option for increased independence 2/2 difficulty maintaining standing balance w/o UE support. Pt able to roll L and R to doff underwear, then achieved figure 4 position semi-reclined to thread LLE into underwear and pants. Pt also able to don R sock using similar techniques, but required assist to don L sock. Pt transitioned to sitting EOB with Min A. Worked on sitting balance while donning shirt. 2 lateral LOB requiring mod A to correct. Educated on modified strategies for donning shoes and L AFO. Pt then completed squat-pivot transfer to R w/ Min A, 1/2 lap tray positioned on wc for L UE support 2/2 shoulder subluxation. 1:1 NMES applied to wrist extensor on channel 1, then to supraspinatus and middle deltoid on channel 2 to help approximate shoulder joint to reduce sublux and reduce pain.   Ratio 1:1 Rate 35 pps Waveform- Asymmetric Ramp 1.0 Pulse 300 Intensity- 20 Duration -15     Report of pain at the beginning of session 7 Report of pain at the end of session 7  No adverse reactions after treatment and is skin intact.    Session 2 Treatment session focused on L NMR and L shoulder pain management. OT applied kinesiotape for L shoulder subluxation  pain and to help approximate joint. Pt then completed L NMR using Inmotion Arm. Pt with trace L UE activation prior to treatment. Pt then was able to complete most of the R upper and lower quadrants with improved muscle activation upon second trial. Pt then returned to room and transferred back to bed with Min A squat-pivot to R. Pt brought into side-lying for gravity eliminated L NMR. Focus on shoulder/elbow flex/ext with joint input provided to bring pt through full ROM. Pt with much improved activation of L flexors. Pt left in supported side-lying position with needs met.   Therapy Documentation Precautions:  Precautions Precautions: Fall Precaution Comments: Peritoneal dialysis patient; L subluxed shoulder Restrictions Weight Bearing Restrictions: No Pain: Pain Assessment Pain Assessment: 0-10 Pain Score: 7  Pain Type: Acute pain Pain Location: Shoulder Pain Orientation: Left Pain Descriptors / Indicators: Aching Pain Onset: On-going Pain Intervention(s): Repositioned ADL: ADL ADL Comments: refer to functional navigator  See Function Navigator for Current Functional Status.   Therapy/Group: Individual Therapy  [name] 01/15/2017, 4:11 PM

## 2017-01-15 NOTE — Progress Notes (Signed)
[facility] Progress Note   Subjective: stable, walked in hall w/ 2 person assist    Objective Vitals:   01/14/17 0500 01/14/17 1526 01/15/17 0700 01/15/17 1407  BP: (!) 155/86 121/78 (!) 152/79 117/73  Pulse: 72 67 65 70  Resp: 16 16 12 14   Temp: 98.4 F (36.9 C) 97.8 F (36.6 C) 98.2 F (36.8 C) 98.6 F (37 C)  TempSrc: Oral Oral Oral Oral  SpO2: 98%  98% 99%  Weight: 50 kg (110 lb 3.2 oz)  48.1 kg (106 lb)   Height:       Physical Exam General: slender AAF NAD  Good spirits Regular S1S2 No S3 Lungs clear Abdomen: soft NT No LE edema Left hemiparesis. PD cath with clear dry dressing R abdomen Right upper arm AVF aneurysmal/+ bruit   Dialysis:PD 5 exchanges overnight, no day bag, no pause, 2000 volume, using all 1.5%- UF of 1.5 liters   Summary: Pt is a 50 y.o.yo femalewith ESRD on PD who was admitted on 6/16/2018with sz/hemiparesis- found to have right sided frontal lobe hemorrhage, alsowith PRES. Seizures controlled in [place]. Had cardiac arrest 12/25/16 in Inpt HD. Transferred to Rehab 01/04/17.  Assessment: 1. Intracranial CVA/ small SAH R frontal, + PRES with L hemiparesis - pt was recovering then cardiac arrest at inpt dialysis on 12/25/17; then transitioned back to PD- making progress again. 2. ESRD- Continue CCPD.  Family in [place] are planning to take her after dc for 6 mos. 3. HTN/volume - stable 4. Cardiac arrest - while on HD 6/22. Refuses to ever consider HD again.  5. Anemia- history of GIB and low hgb. Responded toowell to ESA and and Hb now too high (>15). Holding ESA now 6. MBD - Had hypercalcemia Ca 10.9 on 7/2.   Stopped calcium acetate but the became hypocalcemic (7.2) so resumed Ca acetate and decreased sensipar from 60/d to 30/d.  7. Nutrition Alb 2.7 - regular diet ok, has Nepro and vitamins    Plan - cont PD w/ half 1.5% and half 2.5%  Recent Labs Lab 01/09/17 0447 01/10/17 0524 01/11/17 0405  NA 134* 135  136  K 3.6 3.8 4.0  CL 96* 95* 96*  CO2 24 24 24   GLUCOSE 110* 102* 98  BUN 62* 61* 60*  CREATININE 12.87* 13.27* 14.22*  CALCIUM 7.2* 7.2* 7.1*  PHOS 5.0* 5.1* 5.8*     Recent Labs Lab 01/09/17 0447 01/10/17 0524 01/11/17 0405  ALBUMIN 2.7* 2.7* 2.7*    No results for input(s): WBC, NEUTROABS, HGB, HCT, MCV, PLT in the last 168 hours.  Lab Results  Component Value Date   INR 0.93 12/21/2016   INR 1.88 12/19/2016   INR 0.97 12/19/2016    Medications:  . amLODipine  10 mg Oral Daily  . calcium acetate  667 mg Oral TID WC  . carvedilol  12.5 mg Oral BID WC  . cinacalcet  30 mg Oral Q supper  . famotidine  20 mg Oral Daily  . feeding supplement (NEPRO CARB STEADY)  237 mL Oral BID BM  . FLUoxetine  20 mg Oral Daily  . gentamicin cream  1 application Topical Daily  . levETIRAcetam  500 mg Oral BID  . levothyroxine  88 mcg Oral QAC breakfast  . multivitamin  1 tablet Oral Daily  . pantoprazole  40 mg Oral BID  . polyethylene glycol  17 g Oral Daily  . senna-docusate  2 tablet Oral BID

## 2017-01-15 NOTE — Progress Notes (Signed)
Physical Therapy Session Note  Patient Details  Name: Terry Juarez MRN: [id number] Date of Birth: 01/02/1966  Today's Date: 01/15/2017 PT Individual Time: 1300-1400 PT Individual Time Calculation (min): 60 min   Short Term Goals: Week 2:  PT Short Term Goal 1 (Week 2): Pt will ambulate with LRAD x50' with max assist PT Short Term Goal 2 (Week 2): Pt will transfer with consistent min assist PT Short Term Goal 3 (Week 2): Pt will initiate stair training with PT PT Short Term Goal 4 (Week 2): Pt will demo static standing balance with RUE support with min assist   Skilled Therapeutic Interventions/Progress Updates:    no c/o of pain. PT treatment session focused on NMR during functional activities of ambulation and stairs and PNF.  Pt supine in bed upon arrival and agreeable to PT treatment session. Pt performed supine to sit using bed rails with min assist for L LE lifting. PT doned L shoe with ground reaction force AFO and pt doned R LE shoe independently with increased time and noted posterior LOB with pt able to recover without assistance and no UE support. Pt performed squat pivot transfer from bed to w/c with min assist for weight shifting. Pt ambulated 48ft x 2 with hemiwalker in R UE and max assist for trunk control and PT assist for L LE swing through and facilitation at quads and glutes for extension during stance phase. Pt ascended/descended 4 stairs x2 step-to pattern with R UE support on handrail with max assist for trunk control and L LE lifting onto stair. PT provided tactile cues at the L quads to facilitate L LE extension during stance for R LE step up/down. Pt performed the PNF D1 extension pattern x15 of the L LE in supine for NMR of hip and knee extension for carry over during stance on L LE with noticed increased activation of the glutes and quads. Pt performed squat pivot transfer from mat to w/c with min assit for weight shifting. Pt left sitting in w/c in room with call bell  in reach.   Therapy Documentation Precautions:  Precautions Precautions: Fall Precaution Comments: Peritoneal dialysis patient; L subluxed shoulder Restrictions Weight Bearing Restrictions: No  See Function Navigator for Current Functional Status.   Therapy/Group: Individual Therapy  Shah Insley 01/15/2017, 3:36 PM

## 2017-01-15 NOTE — Progress Notes (Signed)
[facility]     PROGRESS NOTE  Subjective/Complaints:   Appreciate nephro note, no Left shoulder pain  ROS: Denies CP, SOB, N/V/D.  Objective: Vital Signs: Blood pressure 121/78, pulse 67, temperature 97.8 F (36.6 C), temperature source Oral, resp. rate 16, height 5\' 5"  (1.651 m), weight 50 kg (110 lb 3.2 oz), SpO2 98 %. No results found. No results for input(s): WBC, HGB, HCT, PLT in the last 72 hours. No results for input(s): NA, K, CL, GLUCOSE, BUN, CREATININE, CALCIUM in the last 72 hours.  Invalid input(s): CO CBG (last 3)  No results for input(s): GLUCAP in the last 72 hours.  Wt Readings from Last 3 Encounters:  01/14/17 50 kg (110 lb 3.2 oz)  01/04/17 50.7 kg (111 lb 12.4 oz)  09/04/16 54.4 kg (120 lb)    Physical Exam:  BP 121/78 (BP Location: Left Wrist)   Pulse 67   Temp 97.8 F (36.6 C) (Oral)   Resp 16   Ht 5\' 5"  (1.651 m)   Wt 50 kg (110 lb 3.2 oz)   SpO2 98%   BMI 18.34 kg/m  Constitutional: She appears well-developed. Frail  HENT: Normocephalic and atraumatic.  Eyes: EOMI. No discharge.  Cardiovascular: RRR. No JVD. Respiratory: CTA Bilaterally. Normal effort .  GI: Soft. Bowel sounds are normal.   Musculoskeletal: She exhibits no edema or tenderness.  Neurological: She is alert.  Fair awareness of deficits.  Motor: RUE/RLE: 5/5 LUE/LLE: 0/5 No increase in tone Skin: Skin is warm and dry.  Psychiatric: Her behavior is normal. Her affect is blunt. She does not engage much   Assessment/Plan: 1. Functional deficits secondary to right frontal hemorrhage, small SAH and signs of PRES which require 3+ hours per day of interdisciplinary therapy in a comprehensive inpatient rehab setting. Physiatrist is providing close team supervision and 24 hour management of active medical problems listed below. Physiatrist and rehab team continue to assess barriers to discharge/monitor patient progress toward functional and  medical goals.  Function:  Bathing Bathing position   Position: Shower  Bathing parts Body parts bathed by patient: Left arm, Chest, Abdomen, Front perineal area, Right upper leg, Left upper leg, Buttocks, Right lower leg Body parts bathed by helper: Left lower leg, Right arm  Bathing assist Assist Level: Touching or steadying assistance(Pt > 75%)      Upper Body Dressing/Undressing Upper body dressing   What is the patient wearing?: Pull over shirt/dress     Pull over shirt/dress - Perfomed by patient: Thread/unthread right sleeve, Thread/unthread left sleeve, Put head through opening Pull over shirt/dress - Perfomed by helper: Pull shirt over trunk        Upper body assist Assist Level: Touching or steadying assistance(Pt > 75%)      Lower Body Dressing/Undressing Lower body dressing   What is the patient wearing?: Pants, Underwear, [employer], Shoes Underwear - Performed by patient: Thread/unthread right underwear leg, Thread/unthread left underwear leg Underwear - Performed by helper: Pull underwear up/down Pants- Performed by patient: Thread/unthread right pants leg Pants- Performed by helper: Thread/unthread left pants leg, Pull pants up/down Non-skid slipper socks- Performed by patient: Don/doff right sock Non-skid slipper socks- Performed by helper: Don/doff left sock Socks - Performed by patient: Don/doff right sock Socks - Performed by helper: Don/doff left sock Shoes - Performed by patient: Don/doff right shoe, Fasten right Shoes - Performed by helper: Don/doff left shoe, Fasten left     TED Hose - Performed by patient:  Don/doff right TED hose TED Hose - Performed by helper: Don/doff left TED hose  Lower body assist Assist for lower body dressing: Touching or steadying assistance (Pt > 75%)      Toileting Toileting Toileting activity did not occur: No continent bowel/bladder event Toileting steps completed by patient: Performs perineal hygiene Toileting steps  completed by helper: Adjust clothing prior to toileting, Performs perineal hygiene, Adjust clothing after toileting Toileting Assistive Devices: Grab bar or rail  Toileting assist Assist level: Touching or steadying assistance (Pt.75%)   Transfers Chair/bed transfer   Chair/bed transfer method: Squat pivot Chair/bed transfer assist level: Moderate assist (Pt 50 - 74%/lift or lower) Chair/bed transfer assistive device: Armrests     Locomotion Ambulation     Max distance: 40 Assist level: Maximal assist (Pt 25 - 49%)   Wheelchair   Type: Manual Max wheelchair distance: 150 Assist Level: Supervision or verbal cues  Cognition Comprehension Comprehension assist level: Follows complex conversation/direction with extra time/assistive device  Expression Expression assist level: Expresses complex 90% of the time/cues < 10% of the time  Social Interaction Social Interaction assist level: Interacts appropriately 90% of the time - Needs monitoring or encouragement for participation or interaction.  Problem Solving Problem solving assist level: Solves complex 90% of the time/cues < 10% of the time  Memory Memory assist level: Recognizes or recalls 90% of the time/requires cueing < 10% of the time    Medical Problem List and Plan: 1.  Decreased functional mobility secondary to right frontal hemorrhage, small SAH and signs of PRES  Per PT note 7/12 progressing toward goals with target D/C 7/31  PRAFO/WHO   Fluoxetine started 7/3, increased to 20 on 7/10  2.  DVT Prophylaxis/Anticoagulation: SCDs.   Vascular study neg 3. Pain Management: Tylenol as needed 4. Mood: Provide emotional support 5. Neuropsych: This patient is not fully capable of making decisions on her own behalf. 6. Skin/Wound Care: Routine skin checks 7. Fluids/Electrolytes/Nutrition: Routine I&Os 8. Seizure prophylaxis. Keppra 500 mg every 12 hours 9. ESRD with history of right renal cancer status post nephrectomy. Continue  peritoneal dialysis as per renal services 10. Hypertension. At home Norvasc 5 mg twice daily, Coreg 12.5 mg twice a day, Cozaar 50 mg daily, currently just on coreg systolics running a little higher in am, amlodipine increased from 5- > 10mg  per Nephro on 7/11, Nephro also adjusting dialysate   Vitals:   01/14/17 0500 01/14/17 1526  BP: (!) 155/86 121/78  Pulse: 72 67  Resp: 16 16  Temp: 98.4 F (36.9 C) 97.8 F (36.6 C)   11. Hypothyroidism. Synthroid 12.Constipation. Laxative assistance, lat recorded BM, senna S 1 BID- BM 7/12    LOS (Days) 11 A FACE TO FACE EVALUATION WAS PERFORMED  Charlett Blake 01/15/2017 6:41 AM

## 2017-01-15 NOTE — Progress Notes (Signed)
Occupational Therapy Session Note  Patient Details  Name: Terry Juarez MRN: [id number] Date of Birth: 04/08/1966  Today's Date: 01/15/2017 OT Individual Time: 8588-5027 OT Individual Time Calculation (min): 48 min    Short Term Goals: Week 2:  OT Short Term Goal 1 (Week 2): Pt will complete toilet transfers with consistent min A OT Short Term Goal 2 (Week 2): Pt will utilize L UE as a stabilizer with min questioning cues OT Short Term Goal 3 (Week 2): Pt will complete LB dressing with consistent min A  Skilled Therapeutic Interventions/Progress Updates: Patient supine in bed but concurred to work on seated balance, standing balance, left visual scanning and left hand modified stereognosis treatment.    Patient required mod assistance and support at left knee to maintain poor+ standing balance.     She completed sit to stand with Min A x 3.  Patient was assisted back to bed with moderate assist and left with call bell and phone in place.     Therapy Documentation Precautions:  Precautions Precautions: Fall Precaution Comments: Peritoneal dialysis patient; L subluxed shoulder Restrictions Weight Bearing Restrictions: No  Pain:denied    See Function Navigator for Current Functional Status.   Therapy/Group: Individual Therapy  Alfredia Ferguson [facility] 01/15/2017, 12:17 PM

## 2017-01-16 NOTE — Progress Notes (Signed)
Patient ID: Terry Juarez, female   DOB: 1965-07-20, 51 y.o.   MRN: [id number]   01/16/17.  Terry Juarez is a 51 y.o. female  Admit for CIR with Functional deficits secondary to right frontal hemorrhage, small SAH and signs of PRES    Subjective: No new complaints. No new problems. Slept well. Feeling OK. Tolerating PD  Past Medical History:  Diagnosis Date  . Anemia   . Bruises easily   . Dialysis patient ([place])   . ESRD on dialysis ([place])   . Hyperlipidemia   . Hypertension   . Renal disorder    rt renal mass / < functioning of left kidney - being prepared for possible dialysis  . Right renal mass    BP Readings from Last 3 Encounters:  01/16/17 (!) 146/92  01/04/17 129/78  09/04/16 (!) 174/99    Objective: Vital signs in last 24 hours: Temp:  [98.6 F (37 C)] 98.6 F (37 C) (07/14 0530) Pulse Rate:  [68-70] 68 (07/14 0530) Resp:  [14-16] 16 (07/14 0530) BP: (117-146)/(73-92) 146/92 (07/14 0530) SpO2:  [96 %-99 %] 96 % (07/14 0530) Weight:  [106 lb 7.7 oz (48.3 kg)] 106 lb 7.7 oz (48.3 kg) (07/14 0530) Weight change: 7.7 oz (0.219 kg) Last BM Date: 01/14/17  Intake/Output from previous day: 07/13 0701 - 07/14 0700 In: 10807 [P.O.:840] Out: 50388  Last cbgs: CBG (last 3)  No results for input(s): GLUCAP in the last 72 hours.   Physical Exam General: No apparent distress   HEENT: not dry Lungs: Normal effort. Lungs clear to auscultation, no crackles or wheezes. Cardiovascular: Regular rate and rhythm, no edema; transmitted sounds from AVF Abdomen: S/NT/ND; BS(+) Musculoskeletal:  Unchanged; L hand  And wrist brace Neurological: No new neurological deficits; dense L HP Wounds: AVF R antecubital fossa Skin: clear  Aging changes Mental state: Alert, oriented, cooperative    Lab Results: BMET    Component Value Date/Time   NA 136 01/11/2017 0405   K 4.0 01/11/2017 0405   CL 96 (L) 01/11/2017 0405   CO2 24 01/11/2017 0405   GLUCOSE 98 01/11/2017  0405   BUN 60 (H) 01/11/2017 0405   CREATININE 14.22 (H) 01/11/2017 0405   CALCIUM 7.1 (L) 01/11/2017 0405   GFRNONAA 3 (L) 01/11/2017 0405   GFRAA 3 (L) 01/11/2017 0405   CBC    Component Value Date/Time   WBC 8.3 01/08/2017 0612   RBC 4.86 01/08/2017 0612   HGB 15.4 (H) 01/08/2017 0612   HCT 49.1 (H) 01/08/2017 0612   PLT 262 01/08/2017 0612   MCV 101.0 (H) 01/08/2017 0612   MCH 31.7 01/08/2017 0612   MCHC 31.4 01/08/2017 0612   RDW 14.8 01/08/2017 0612   LYMPHSABS 1.0 12/20/2016 0140   MONOABS 0.7 12/20/2016 0140   EOSABS 0.1 12/20/2016 0140   BASOSABS 0.0 12/20/2016 0140    Studies/Results: No results found.  Medications: I have reviewed the patient's current medications.  Assessment/Plan:  Functional deficits secondary to right frontal hemorrhage, small SAH and signs of PRES  CKD- continue PD HTN- somewhat labile.  Will continue to monitor on present regimen Hypothyroidism- continue levothyroxine    Length of stay, days: 12  Nyoka Cowden , MD 01/16/2017, 9:18 AM

## 2017-01-16 NOTE — Progress Notes (Signed)
  [facility] Progress Note   Subjective: stable, BP's good and wt stable    Objective Vitals:   01/14/17 1526 01/15/17 0700 01/15/17 1407 01/16/17 0530  BP: 121/78 (!) 152/79 117/73 (!) 146/92  Pulse: 67 65 70 68  Resp: 16 12 14 16   Temp: 97.8 F (36.6 C) 98.2 F (36.8 C) 98.6 F (37 C) 98.6 F (37 C)  TempSrc: Oral Oral Oral Oral  SpO2:  98% 99% 96%  Weight:  48.1 kg (106 lb)  48.3 kg (106 lb 7.7 oz)  Height:       Physical Exam General: slender AAF NAD  Good spirits Regular S1S2 No S3 Lungs clear Abdomen: soft NT No LE edema Left hemiparesis. PD cath with clear dry dressing R abdomen Right upper arm AVF aneurysmal/+ bruit   Dialysis:PD 5 exchanges overnight, no day bag, no pause, 2000 volume, using all 1.5%- UF of 1.5 liters   Summary: Pt is a 51 y.o.yo femalewith ESRD on PD who was admitted on 6/16/2018with sz/hemiparesis- found to have right sided frontal lobe hemorrhage, alsowith PRES. Seizures controlled in [place]. Had cardiac arrest 12/25/16 in Inpt HD. Transferred to Rehab 01/04/17.  Assessment: 1. Intracranial CVA/ small SAH R frontal, + PRES with L hemiparesis - pt was recovering then had cardiac arrest at inpt dialysis on 12/25/17; transitioned back to PD- making progress again. 2. ESRD- Continue CCPD.  Family in [place] are planning to take her after dc for 6 mos. 3. HTN/volume - stable 4. Cardiac arrest - while on HD 6/22. Refuses to ever consider HD again.  5. Anemia- history of GIB and low hgb. Responded toowell to ESA and and Hb now too high (>15). Holding ESA now 6. MBD - Had hypercalcemia Ca 10.9 on 7/2.   Stopped calcium acetate but then became hypocalcemic (7.2) so resumed Ca acetate and stopped sensipar.   7. Nutrition Alb 2.7 - regular diet ok, has Nepro and vitamins    Plan - cont PD w/ half 1.5% and half 2.5%  Recent Labs Lab 01/10/17 0524 01/11/17 0405  NA 135 136  K 3.8 4.0  CL 95* 96*  CO2 24 24  GLUCOSE  102* 98  BUN 61* 60*  CREATININE 13.27* 14.22*  CALCIUM 7.2* 7.1*  PHOS 5.1* 5.8*     Recent Labs Lab 01/10/17 0524 01/11/17 0405  ALBUMIN 2.7* 2.7*    No results for input(s): WBC, NEUTROABS, HGB, HCT, MCV, PLT in the last 168 hours.  Lab Results  Component Value Date   INR 0.93 12/21/2016   INR 1.88 12/19/2016   INR 0.97 12/19/2016    Medications:  . amLODipine  10 mg Oral Daily  . calcium acetate  667 mg Oral TID WC  . carvedilol  12.5 mg Oral BID WC  . cinacalcet  30 mg Oral Q supper  . famotidine  20 mg Oral Daily  . feeding supplement (NEPRO CARB STEADY)  237 mL Oral BID BM  . FLUoxetine  20 mg Oral Daily  . gentamicin cream  1 application Topical Daily  . levETIRAcetam  500 mg Oral BID  . levothyroxine  88 mcg Oral QAC breakfast  . multivitamin  1 tablet Oral Daily  . pantoprazole  40 mg Oral BID  . polyethylene glycol  17 g Oral Daily  . senna-docusate  2 tablet Oral BID

## 2017-01-17 ENCOUNTER — Encounter (HOSPITAL_COMMUNITY): Payer: Self-pay

## 2017-01-17 LAB — RENAL FUNCTION PANEL
ANION GAP: 14 (ref 5–15)
Albumin: 2.7 g/dL — ABNORMAL LOW (ref 3.5–5.0)
BUN: 57 mg/dL — AB (ref 6–20)
CHLORIDE: 97 mmol/L — AB (ref 101–111)
CO2: 25 mmol/L (ref 22–32)
Calcium: 8.1 mg/dL — ABNORMAL LOW (ref 8.9–10.3)
Creatinine, Ser: 15.18 mg/dL — ABNORMAL HIGH (ref 0.44–1.00)
GFR, EST AFRICAN AMERICAN: 3 mL/min — AB (ref >60)
GFR, EST NON AFRICAN AMERICAN: 2 mL/min — AB (ref >60)
Glucose, Bld: 101 mg/dL — ABNORMAL HIGH (ref 65–99)
POTASSIUM: 3.8 mmol/L (ref 3.5–5.1)
Phosphorus: 5.1 mg/dL — ABNORMAL HIGH (ref 2.5–4.6)
Sodium: 136 mmol/L (ref 135–145)

## 2017-01-17 LAB — CBC
HEMATOCRIT: 42 % (ref 36.0–46.0)
HEMOGLOBIN: 13.4 g/dL (ref 12.0–15.0)
MCH: 31.6 pg (ref 26.0–34.0)
MCHC: 31.9 g/dL (ref 30.0–36.0)
MCV: 99.1 fL (ref 78.0–100.0)
Platelets: 280 10*3/uL (ref 150–400)
RBC: 4.24 MIL/uL (ref 3.87–5.11)
RDW: 14.8 % (ref 11.5–15.5)
WBC: 7.2 10*3/uL (ref 4.0–10.5)

## 2017-01-17 NOTE — Progress Notes (Signed)
Pt disconnected PD treatment at 8:30 because an emergency HD pt in ED needs treatment stat.Marland Kitchen Per Dr. Roney Jaffe ordered.

## 2017-01-17 NOTE — Progress Notes (Signed)
Patient ID: Terry Juarez, female   DOB: 17-Jun-1966, 51 y.o.   MRN: [id number]   01/17/17.  Terry Juarez is a 51 y.o. female Admitted for CIR with functional deficits secondary to a right frontal hemorrhage with small subarachnoid hemorrhage and signs consistent with PRES.  Patient has end-stage renal disease treated with PD.  Patient evaluated by nephrology yesterday  Subjective: No new complaints. No new problems. Slept well.  Remains asymptomatic  Objective: Vital signs in last 24 hours: Temp:  [98.3 F (36.8 C)-98.6 F (37 C)] 98.3 F (36.8 C) (07/15 0500) Pulse Rate:  [68-71] 68 (07/15 0500) Resp:  [16] 16 (07/15 0500) BP: (113-152)/(70-83) 152/83 (07/15 0500) SpO2:  [96 %-97 %] 97 % (07/15 0500) Weight:  [105 lb 13.1 oz (48 kg)] 105 lb 13.1 oz (48 kg) (07/15 0500) Weight change: -10.6 oz (-0.3 kg) Last BM Date: 01/14/17  Intake/Output from previous day: 07/14 0701 - 07/15 0700 In: 10597 [P.O.:600] Out: 11230  Last cbgs: CBG (last 3)  No results for input(s): GLUCAP in the last 72 hours.   Past Medical History:  Diagnosis Date  . Anemia   . Bruises easily   . Dialysis patient ([place])   . ESRD on dialysis ([place])   . Hyperlipidemia   . Hypertension   . Renal disorder    rt renal mass / < functioning of left kidney - being prepared for possible dialysis  . Right renal mass       Physical Exam General: No apparent distress   HEENT: not dry Lungs: Normal effort. Lungs clear to auscultation, no crackles or wheezes. Cardiovascular: Regular rate and rhythm, no edema; transmitted bruit from AVF Abdomen: S/NT/ND; BS(+) Musculoskeletal:  unchanged Neurological: No new neurological deficits; stable left hemiparesis Wounds: N/A.  Aneurysmal enlargement of AVF , right antecubital fossa with palpable thrill Skin: clear   Mental state: Alert, oriented, cooperative  BP Readings from Last 3 Encounters:  01/17/17 (!) 152/83  01/04/17 129/78  09/04/16 (!) 174/99     Lab Results: BMET    Component Value Date/Time   NA 136 01/17/2017 0436   K 3.8 01/17/2017 0436   CL 97 (L) 01/17/2017 0436   CO2 25 01/17/2017 0436   GLUCOSE 101 (H) 01/17/2017 0436   BUN 57 (H) 01/17/2017 0436   CREATININE 15.18 (H) 01/17/2017 0436   CALCIUM 8.1 (L) 01/17/2017 0436   GFRNONAA 2 (L) 01/17/2017 0436   GFRAA 3 (L) 01/17/2017 0436   CBC    Component Value Date/Time   WBC 7.2 01/17/2017 0436   RBC 4.24 01/17/2017 0436   HGB 13.4 01/17/2017 0436   HCT 42.0 01/17/2017 0436   PLT 280 01/17/2017 0436   MCV 99.1 01/17/2017 0436   MCH 31.6 01/17/2017 0436   MCHC 31.9 01/17/2017 0436   RDW 14.8 01/17/2017 0436   LYMPHSABS 1.0 12/20/2016 0140   MONOABS 0.7 12/20/2016 0140   EOSABS 0.1 12/20/2016 0140   BASOSABS 0.0 12/20/2016 0140    Studies/Results: No results found.  Medications: I have reviewed the patient's current medications.  Assessment/Plan:  Functional deficits secondary to intracranial CVA with left hemiparesis.  Continue CIR End-stage renal disease.  Continue PD.  Nephrology follow-up Hypertension, stable.  No change in therapy Status post cardiac arrest    Length of stay, days: [place] , MD 01/17/2017, 8:29 AM

## 2017-01-18 ENCOUNTER — Inpatient Hospital Stay (HOSPITAL_COMMUNITY): Payer: BLUE CROSS/BLUE SHIELD | Admitting: Physical Therapy

## 2017-01-18 ENCOUNTER — Inpatient Hospital Stay (HOSPITAL_COMMUNITY): Payer: BLUE CROSS/BLUE SHIELD | Admitting: Speech Pathology

## 2017-01-18 ENCOUNTER — Encounter (HOSPITAL_COMMUNITY): Payer: BLUE CROSS/BLUE SHIELD | Admitting: Psychology

## 2017-01-18 ENCOUNTER — Inpatient Hospital Stay (HOSPITAL_COMMUNITY): Payer: BLUE CROSS/BLUE SHIELD | Admitting: Occupational Therapy

## 2017-01-18 MED ORDER — DELFLEX-LC/1.5% DEXTROSE 344 MOSM/L IP SOLN
INTRAPERITONEAL | Status: DC
  Administered 2017-01-18: 19:00:00 via INTRAPERITONEAL
  Administered 2017-01-19: 5000 mL via INTRAPERITONEAL
  Administered 2017-01-21: 10000 mL via INTRAPERITONEAL
  Administered 2017-01-23: 10 L via INTRAPERITONEAL
  Administered 2017-01-25: 19:00:00 via INTRAPERITONEAL
  Administered 2017-01-26: 10 L via INTRAPERITONEAL
  Administered 2017-01-27: 19:00:00 via INTRAPERITONEAL

## 2017-01-18 NOTE — Progress Notes (Signed)
  [facility] Progress Note   Subjective:  Seen in room sitting in wheelchair Says stomach cramping on PD last night     Objective Vitals:   01/17/17 0500 01/17/17 1437 01/18/17 0500 01/18/17 0711  BP: (!) 152/83 116/65 136/86 135/87  Pulse: 68 74 66 72  Resp: 16 16 16 17   Temp: 98.3 F (36.8 C) 98.8 F (37.1 C) 98.1 F (36.7 C) 98.6 F (37 C)  TempSrc: Oral Oral Oral Oral  SpO2: 97% 97% 97% 96%  Weight: 48 kg (105 lb 13.1 oz)  47.6 kg (104 lb 15 oz) 46.3 kg (102 lb 1.2 oz)  Height:       Physical Exam General: slender AAF NAD  Good spirits Regular S1S2 No S3 Lungs clear Abdomen: soft NT No LE edema Left hemiparesis. PD cath with clear dry dressing R abdomen Right upper arm AVF aneurysmal/+ bruit   Dialysis:PD 5 exchanges overnight, no day bag, no pause, 2000 volume, using all 1.5%- UF of 1.5 liters   Summary: Pt is a 51 y.o.yo femalewith ESRD on PD who was admitted on 6/16/2018with sz/hemiparesis- found to have right sided frontal lobe hemorrhage, alsowith PRES. Seizures controlled in [place]. Had cardiac arrest 12/25/16 in Inpt HD. Transferred to Rehab 01/04/17.  Assessment: 1. Intracranial CVA/ small SAH R frontal, + PRES with L hemiparesis - pt was recovering then had cardiac arrest at inpt dialysis on 12/25/17; transitioned back to PD- making progress again. 2. ESRD- Continue CCPD.  Family in [place] are planning to take her after dc for 6 mos. 3. HTN/volume - stable 4. Cardiac arrest - while on HD 6/22. Refuses to ever consider HD again.  5. Anemia- history of GIB and low hgb. Responded toowell to ESA and and Hb now >13. Holding ESA now 6. MBD - Had hypercalcemia Ca 10.9 on 7/2.   Stopped calcium acetate but then became hypocalcemic (7.2) so resumed Ca acetate and stopped sensipar.   7. Nutrition Alb 2.7 - regular diet ok, has Nepro and vitamins    Plan - cont PD -use all 1.5% tonight   Recent Labs Lab 01/17/17 0436  NA 136  K  3.8  CL 97*  CO2 25  GLUCOSE 101*  BUN 57*  CREATININE 15.18*  CALCIUM 8.1*  PHOS 5.1*     Recent Labs Lab 01/17/17 0436  ALBUMIN 2.7*     Recent Labs Lab 01/17/17 0436  WBC 7.2  HGB 13.4  HCT 42.0  MCV 99.1  PLT 280    Lab Results  Component Value Date   INR 0.93 12/21/2016   INR 1.88 12/19/2016   INR 0.97 12/19/2016    Medications:  . amLODipine  10 mg Oral Daily  . calcium acetate  667 mg Oral TID WC  . carvedilol  12.5 mg Oral BID WC  . famotidine  20 mg Oral Daily  . feeding supplement (NEPRO CARB STEADY)  237 mL Oral BID BM  . FLUoxetine  20 mg Oral Daily  . gentamicin cream  1 application Topical Daily  . levETIRAcetam  500 mg Oral BID  . levothyroxine  88 mcg Oral QAC breakfast  . multivitamin  1 tablet Oral Daily  . pantoprazole  40 mg Oral BID  . polyethylene glycol  17 g Oral Daily  . senna-docusate  2 tablet Oral BID

## 2017-01-18 NOTE — Progress Notes (Signed)
Physical Therapy Session Note  Patient Details  Name: Terry Juarez MRN: [id number] Date of Birth: 18-Dec-1965  Today's Date: 01/18/2017 PT Individual Time: 1300-1330 PT Individual Time Calculation (min): 30 min   Short Term Goals: Week 1:  PT Short Term Goal 1 (Week 1): Pt will transfer from bed to w/c with moderate assist in either direction  PT Short Term Goal 1 - Progress (Week 1): Met PT Short Term Goal 2 (Week 1): Pt will perform all bed mobility with min assist PT Short Term Goal 2 - Progress (Week 1): Met PT Short Term Goal 3 (Week 1): Pt will demonstrate dynamic standing balance for functional tasks requiring moderate assistance PT Short Term Goal 3 - Progress (Week 1): Not met PT Short Term Goal 4 (Week 1): Pt will propel w/c 50 ft in a controlled environment with supervision  PT Short Term Goal 4 - Progress (Week 1): Met Week 2:  PT Short Term Goal 1 (Week 2): Pt will ambulate with LRAD x50' with max assist PT Short Term Goal 2 (Week 2): Pt will transfer with consistent min assist PT Short Term Goal 3 (Week 2): Pt will initiate stair training with PT PT Short Term Goal 4 (Week 2): Pt will demo static standing balance with RUE support with min assist   Skilled Therapeutic Interventions/Progress Updates:   Pt in bed upon arrival and agreeable to therapy.   Pt transferred supine to EOB and bed to chair w/ Min A from therapist to lift L LE and to block L knee, verbal cues for technique and sequencing.    Pt performed multiple sit-to-stand transfers w/ unilateral R rail use in hallway, Min A to block L knee during during transfer. Pt able to maintain static standing w/ unilateral handrail use for 1-2 seconds at a time w/ supervision and verbal/tactile cues to activate L quad.   Pre-gait training while standing w/ unilateral handrail use:  -lateral weight shifting w/ Mod A to facilitate weight shift and L knee block   -forward weight shifting in tandem stance w/ Mod A to  facilitate weight shift and L knee block   -L single leg stance, 2x10 bouts w/ Max A for L knee block   Pt transferred chair to bed and sit to supine w/ Min A for L knee block and L LE lifting. Ended session supine in bed, all needs met and call bell within reach.   Therapy Documentation Precautions:  Precautions Precautions: Fall Precaution Comments: Peritoneal dialysis patient; L subluxed shoulder Restrictions Weight Bearing Restrictions: No Vital Signs: Therapy Vitals Temp: 98.5 F (36.9 C) Temp Source: Oral Pulse Rate: 72 Resp: 17 BP: 106/64 Patient Position (if appropriate): Lying Oxygen Therapy SpO2: 97 % O2 Device: Not Delivered   See Function Navigator for Current Functional Status.   Therapy/Group: Individual Therapy  Jary Louvier K Arnette 01/18/2017, 2:53 PM

## 2017-01-18 NOTE — Progress Notes (Signed)
Occupational Therapy Session Note  Patient Details  Name: Terry Juarez MRN: [id number] Date of Birth: 1965/12/10  Today's Date: 01/18/2017 OT Individual Time: 7867-5449 OT Individual Time Calculation (min): 59 min    Short Term Goals: Week 2:  OT Short Term Goal 1 (Week 2): Pt will complete toilet transfers with consistent min A OT Short Term Goal 2 (Week 2): Pt will utilize L UE as a stabilizer with min questioning cues OT Short Term Goal 3 (Week 2): Pt will complete LB dressing with consistent min A  Skilled Therapeutic Interventions/Progress Updates:    Tx focus on adaptive B/D skills, postural control, Lt NMR, and functional transfers during self care tasks.   Pt greeted supine in bed, agreeable to tx. Pt completing bathing bedlevel, instructed on adaptive technique for positoning L LE into figure 4 position to wash foot and apply lotion. HOH for using L UE functionally to wash Rt side. Pt threading bilateral LEs into underwear/pants while supine and in long sitting PRN (with Mod A for balance). Pt instructed on bridging with L LE positioned by therapist for lifting LB garments over hips. Pt donned shirt in long sitting and then transitioned to EOB. Min A squat pivot transfer<w/c with instruction on head/hip relationship. Pt completing grooming tasks and oral care w/c level at sink with [facility] facilitation for using L UE as gross stabilizer. Pt left with all needs within reach at time of departure, L UE elevated with pillow on lap tray.   Therapy Documentation Precautions:  Precautions Precautions: Fall Precaution Comments: Peritoneal dialysis patient; L subluxed shoulder Restrictions Weight Bearing Restrictions: No Pain: No c/o pain during tx    ADL: ADL ADL Comments: refer to functional navigator     See Function Navigator for Current Functional Status.   Therapy/Group: Individual Therapy  Khayla Koppenhaver A Rabia Argote 01/18/2017, 12:31 PM

## 2017-01-18 NOTE — Progress Notes (Signed)
Physical Therapy Session Note  Patient Details  Name: Terry Juarez MRN: [id number] Date of Birth: 02/12/1966  Today's Date: 01/18/2017 PT Individual Time: 1400-1500 PT Individual Time Calculation (min): 60 min   Short Term Goals: Week 2:  PT Short Term Goal 1 (Week 2): Pt will ambulate with LRAD x50' with max assist PT Short Term Goal 2 (Week 2): Pt will transfer with consistent min assist PT Short Term Goal 3 (Week 2): Pt will initiate stair training with PT PT Short Term Goal 4 (Week 2): Pt will demo static standing balance with RUE support with min assist   Skilled Therapeutic Interventions/Progress Updates:    no c/o of pain. PT treatment session focused on NMR of the L LE during functional mobility and LE tasks.  Pt supine in bed and agreeable to PT treatment session. Pt performs squat pivot transfers to the L with min assist and to the R with supervision throughout session to/from bed, w/c and mat table. PT performs PNF D1 extension rhythmic initiation with L LE to teach patient the extension pattern. Then pt performs that hip and knee extension pattern with minimal resistance 2x15 for training the motor planning and muscle coordination for carry over into the stance phase of gait. Pt ambulates 44f with a hemiwalker in R UE and max assist for L LE swing through and stance phase. Pt agreeable to TENs unit for NMES to the L quadriceps for NMR of the quad during LAQ for kicking a ball to work on motor planning for quad contraction. Pt returned to room and left supine in bed with call bell in reach and needs met.  Therapy Documentation Precautions:  Precautions Precautions: Fall Precaution Comments: Peritoneal dialysis patient; L subluxed shoulder Restrictions Weight Bearing Restrictions: No   See Function Navigator for Current Functional Status.   Therapy/Group: Individual Therapy  Hillman Attig 01/18/2017, 4:48 PM

## 2017-01-18 NOTE — Consult Note (Signed)
Neuropsychological Consultation   Patient:   Terry Juarez   DOB:   Oct 13, 1965  MR Number:  [id number]  Location:  [place] [address] [facility] [address] [id number] [place] [address] Dept: [place]: [phone]           Date of Service:   01/18/2017  Start Time:   10 AM End Time:   11 AM  Provider/Observer:  Ilean Skill, Psy.D.       Clinical Neuropsychologist       Billing Code/Service: 40347 4 Units  Chief Complaint:    Terry Juarez, a 51 year old female, presented on 12/19/2016 with new onset seizure, acute left-sided weakness and right frontal headache. CT scan showed a 29 mm right frontal hemorrhage with surrounding edema and mass effect as well as small subarachnoid hemorrhage and signs of PRES.  no AVMs were imaged.  Later MRI showed stable hemorrhage and EEG at the time suggested diffuse cerebral dysfunction without active seizure activity.  The patient initially had confusion and other cognitive dysfunction, likely due to seizure activity.  There was a need to change power of attorney and healthcare power of attorney so review of neuropsychological functioning and competency for these legal decisions was requested.  Reason for Service:  Below is the HPI for the current admission:    Terry Juarez a 51 y.o.right handed femalewith history of hypertension,history of GI bleed, ESRD with home peritoneal dialysis, right renal cancer status post nephrectomy.Per chart review and mother-in-law patient lives in [place] with a roommate. Independent prior to admission. One level home with 3 steps to entry. Question 24-hour assistance on discharge.Presented 12/19/2016 with new onset of seizure, acute left-sided weakness and right frontal headache. UDS positive for benzos. Troponin 1.21. CT scan imaging showed a 29 mm right frontal hemorrhage with surrounding edema and mass effect, also a small SAH  and signs of PRES. CTA head and neck showed no AVMs but showed the hematoma was mildly increased from the earlier imaging. Maintain on nicardipine drip as well as Keppra. MRI of the brain reviewed, showing stable hemorrhage. Latest cranial CT head 12/24/2016 reviewed, stable. EEG showed diffuse cerebral dysfunction no seizure activity.Dialysis ongoing as per renal services. Dysphagia #3 thin liquid diet. Therapy evaluations completed 12/22/2016 with recommendations of physical medicine rehabilitation consult.Patient was admitted for a comprehensive rehabilitation program  Current Status:  The patient is doing much better with regard to cognitive functioning.  Speech, executive functioning, memory, visual spatial, and attention appear to be improving and at this point are to the level that would suggest she is competent to make these decisions.  The patient continues with severe hemiparesis of the left side related to recent right hemisphere hemorrhage.   Behavioral Observation: Terry Juarez  presents as a 51 y.o.-year-old Right African American Female who appeared her stated age. her dress was Appropriate and she was Well Groomed and her manners were Appropriate to the situation.  her participation was indicative of Appropriate behaviors.  There were  physical disabilities noted.  she displayed an appropriate level of cooperation and motivation.     Interactions:    Active Appropriate  Attention:   within normal limits but a little below what would normally be expected for age  Memory:   within normal limits; recent and remote memory intact  Visuo-spatial:  within normal limits  Speech (Volume):  low  Speech:   normal; normal  Thought Process:  Coherent  Though  Content:  WNL;   Orientation:   person, place, time/date and situation  Judgment:   Good  Planning:   Fair  Affect:    Flat  Mood:    Depressed  Insight:   Good  Intelligence:   normal  Medical History:   Past Medical  History:  Diagnosis Date  . Anemia   . Bruises easily   . Dialysis patient ([place])   . ESRD on dialysis ([place])   . Hyperlipidemia   . Hypertension   . Renal disorder    rt renal mass / < functioning of left kidney - being prepared for possible dialysis  . Right renal mass         Family Med/Psych History:  Family History  Problem Relation Age of Onset  . Throat cancer Mother        smoked  . Hypertension Father     Risk of Suicide/Violence: low No indications of SI or HI  Impression/DX:  Terry Juarez, a 51 year old female, presented on 12/19/2016 with new onset seizure, acute left-sided weakness and right frontal headache. CT scan showed a 29 mm right frontal hemorrhage with surrounding edema and mass effect as well as small subarachnoid hemorrhage and signs of PRES.  no AVMs were imaged.  Later MRI showed stable hemorrhage and EEG at the time suggested diffuse cerebral dysfunction without active seizure activity.  The patient initially had confusion and other cognitive dysfunction, likely due to seizure activity.  There was a need to change power of attorney and healthcare power of attorney so review of neuropsychological functioning and competency for these legal decisions was requested.  The patient produced a perfect score on Mental Status exam.  She did have some memory loss of events after the right frontal and subarachnoid hemorrhage event.  She is doing well from a cognitive and neuropsychological perspective and is competent to make the legal decisions she has been in the process of doing.    Disposition/Plan:  Will see the patient again later in week to deal with coping and adjustment to current loss of motor function on left side of her body.          Electronically Signed   _______________________ Ilean Skill, Psy.D.

## 2017-01-18 NOTE — Progress Notes (Signed)
Physical Therapy Session Note  Patient Details  Name: Terry Juarez MRN: [id number] Date of Birth: 12-31-1965  Today's Date: 01/18/2017 PT Individual Time: 7322-0254 PT Individual Time Calculation (min): 33 min   Short Term Goals: Week 2:  PT Short Term Goal 1 (Week 2): Pt will ambulate with LRAD x50' with max assist PT Short Term Goal 2 (Week 2): Pt will transfer with consistent min assist PT Short Term Goal 3 (Week 2): Pt will initiate stair training with PT PT Short Term Goal 4 (Week 2): Pt will demo static standing balance with RUE support with min assist   Skilled Therapeutic Interventions/Progress Updates:    no c/o pain but reports fatigue.  Session focus on transfers and activity tolerance.  Supine>sit with supervision and bedrail to L side.  Squat/pivot to R x2 in session with steady assist.  Pt performs community level w/c mobility >1000' with supervision and min assist over thresh holds.  Discussed with pt and her sister in law pt progress and therapy goals while sitting outside.  Pt returned to room/bed at end of session, positioned with call bell in reach and needs met.   Therapy Documentation Precautions:  Precautions Precautions: Fall Precaution Comments: Peritoneal dialysis patient; L subluxed shoulder Restrictions Weight Bearing Restrictions: No   See Function Navigator for Current Functional Status.   Therapy/Group: Individual Therapy  Earnest Conroy Penven-Crew 01/18/2017, 4:33 PM

## 2017-01-18 NOTE — Progress Notes (Signed)
[place] PHYSICAL MEDICINE & REHABILITATION     PROGRESS NOTE  Subjective/Complaints:   No issues overnite  ROS: Denies CP, SOB, N/V/D.  Objective: Vital Signs: Blood pressure 135/87, pulse 72, temperature 98.6 F (37 C), temperature source Oral, resp. rate 17, height 5\' 5"  (1.651 m), weight 46.3 kg (102 lb 1.2 oz), SpO2 96 %. No results found.  Recent Labs  01/17/17 0436  WBC 7.2  HGB 13.4  HCT 42.0  PLT 280    Recent Labs  01/17/17 0436  NA 136  K 3.8  CL 97*  GLUCOSE 101*  BUN 57*  CREATININE 15.18*  CALCIUM 8.1*   CBG (last 3)  No results for input(s): GLUCAP in the last 72 hours.  Wt Readings from Last 3 Encounters:  01/18/17 46.3 kg (102 lb 1.2 oz)  01/04/17 50.7 kg (111 lb 12.4 oz)  09/04/16 54.4 kg (120 lb)    Physical Exam:  BP 135/87 (BP Location: Left Wrist)   Pulse 72   Temp 98.6 F (37 C) (Oral)   Resp 17   Ht 5\' 5"  (1.651 m)   Wt 46.3 kg (102 lb 1.2 oz)   SpO2 96%   BMI 16.99 kg/m  Constitutional: She appears well-developed. Frail  HENT: Normocephalic and atraumatic.  Eyes: EOMI. No discharge.  Cardiovascular: RRR. No JVD. Respiratory: CTA Bilaterally. Normal effort .  GI: Soft. Bowel sounds are normal.   Musculoskeletal: She exhibits no edema or tenderness.  Neurological: She is alert.  Fair awareness of deficits.  Motor: RUE/RLE: 5/5 LUE 0/5/LLE: 2/5 left Hip/knee ext synergy No increase in tone Skin: Skin is warm and dry.  Psychiatric: Her behavior is normal. Her affect is blunt. She does not engage much   Assessment/Plan: 1. Functional deficits secondary to right frontal hemorrhage, small SAH and signs of PRES which require 3+ hours per day of interdisciplinary therapy in a comprehensive inpatient rehab setting. Physiatrist is providing close team supervision and 24 hour management of active medical problems listed below. Physiatrist and rehab team continue to assess barriers to discharge/monitor patient progress toward  functional and medical goals.  Function:  Bathing Bathing position   Position: Shower  Bathing parts Body parts bathed by patient: Left arm, Chest, Abdomen, Front perineal area, Right upper leg, Left upper leg, Buttocks, Right lower leg Body parts bathed by helper: Left lower leg, Right arm  Bathing assist Assist Level: Touching or steadying assistance(Pt > 75%)      Upper Body Dressing/Undressing Upper body dressing   What is the patient wearing?: Pull over shirt/dress     Pull over shirt/dress - Perfomed by patient: Thread/unthread right sleeve, Thread/unthread left sleeve, Put head through opening Pull over shirt/dress - Perfomed by helper: Pull shirt over trunk        Upper body assist Assist Level: Touching or steadying assistance(Pt > 75%)      Lower Body Dressing/Undressing Lower body dressing   What is the patient wearing?: Pants, Underwear, [employer], Shoes Underwear - Performed by patient: Thread/unthread right underwear leg, Thread/unthread left underwear leg Underwear - Performed by helper: Pull underwear up/down Pants- Performed by patient: Thread/unthread right pants leg Pants- Performed by helper: Thread/unthread left pants leg, Pull pants up/down Non-skid slipper socks- Performed by patient: Don/doff right sock Non-skid slipper socks- Performed by helper: Don/doff left sock Socks - Performed by patient: Don/doff right sock Socks - Performed by helper: Don/doff left sock Shoes - Performed by patient: Don/doff right shoe, Fasten right Shoes - Performed  by helper: Don/doff left shoe, Fasten left     TED Hose - Performed by patient: Don/doff right TED hose TED Hose - Performed by helper: Don/doff left TED hose  Lower body assist Assist for lower body dressing: Touching or steadying assistance (Pt > 75%)      Toileting Toileting Toileting activity did not occur: No continent bowel/bladder event Toileting steps completed by patient: Performs perineal  hygiene Toileting steps completed by helper: Adjust clothing prior to toileting, Adjust clothing after toileting Toileting Assistive Devices: Grab bar or rail  Toileting assist Assist level: Touching or steadying assistance (Pt.75%)   Transfers Chair/bed transfer   Chair/bed transfer method: Squat pivot Chair/bed transfer assist level: Touching or steadying assistance (Pt > 75%) Chair/bed transfer assistive device: Armrests     Locomotion Ambulation     Max distance: 61ft Assist level: Maximal assist (Pt 25 - 49%)   Wheelchair   Type: Manual Max wheelchair distance: 159ft Assist Level: Supervision or verbal cues  Cognition Comprehension Comprehension assist level: Follows complex conversation/direction with extra time/assistive device  Expression Expression assist level: Expresses complex 90% of the time/cues < 10% of the time  Social Interaction Social Interaction assist level: Interacts appropriately 90% of the time - Needs monitoring or encouragement for participation or interaction.  Problem Solving Problem solving assist level: Solves complex 90% of the time/cues < 10% of the time  Memory Memory assist level: Recognizes or recalls 90% of the time/requires cueing < 10% of the time    Medical Problem List and Plan: 1.  Decreased functional mobility secondary to right frontal hemorrhage, small SAH and signs of PRES  Cont CIR PT OT, SLP  PRAFO/WHO   Fluoxetine started 7/3, increased to 20 on 7/10  2.  DVT Prophylaxis/Anticoagulation: SCDs.   Vascular study neg 3. Pain Management: Tylenol as needed 4. Mood: Provide emotional support 5. Neuropsych: This patient is not fully capable of making decisions on her own behalf. 6. Skin/Wound Care: Routine skin checks 7. Fluids/Electrolytes/Nutrition: Routine I&Os 8. Seizure prophylaxis. Keppra 500 mg every 12 hours 9. ESRD with history of right renal cancer status post nephrectomy. Continue peritoneal dialysis as per renal  services 10. Hypertension. At home Norvasc 5 mg twice daily, Coreg 12.5 mg twice a day, Cozaar 50 mg daily, , amlodipine increased from 5- > 10mg  per Nephro on 7/11, Nephro also adjusting dialysate BP improved 7/16   Vitals:   01/18/17 0500 01/18/17 0711  BP: 136/86 135/87  Pulse: 66 72  Resp: 16 17  Temp: 98.1 F (36.7 C) 98.6 F (37 C)   11. Hypothyroidism. Synthroid 12.Constipation. Laxative assistance, lat recorded BM, senna S 1 BID- BM 7/15    LOS (Days) 14 A FACE TO FACE EVALUATION WAS PERFORMED  KIRSTEINS,ANDREW E 01/18/2017 7:52 AM

## 2017-01-19 ENCOUNTER — Inpatient Hospital Stay (HOSPITAL_COMMUNITY): Payer: BLUE CROSS/BLUE SHIELD | Admitting: Occupational Therapy

## 2017-01-19 ENCOUNTER — Inpatient Hospital Stay (HOSPITAL_COMMUNITY): Payer: BLUE CROSS/BLUE SHIELD | Admitting: Physical Therapy

## 2017-01-19 NOTE — Progress Notes (Signed)
Physical Therapy Session Note  Patient Details  Name: Terry Juarez MRN: [id number] Date of Birth: 02-Jul-1966  Today's Date: 01/19/2017 PT Individual Time: 1415-1500 PT Individual Time Calculation (min): 45 min   Short Term Goals: Week 1:  PT Short Term Goal 1 (Week 1): Pt will transfer from bed to w/c with moderate assist in either direction  PT Short Term Goal 1 - Progress (Week 1): Met PT Short Term Goal 2 (Week 1): Pt will perform all bed mobility with min assist PT Short Term Goal 2 - Progress (Week 1): Met PT Short Term Goal 3 (Week 1): Pt will demonstrate dynamic standing balance for functional tasks requiring moderate assistance PT Short Term Goal 3 - Progress (Week 1): Not met PT Short Term Goal 4 (Week 1): Pt will propel w/c 50 ft in a controlled environment with supervision  PT Short Term Goal 4 - Progress (Week 1): Met Week 2:  PT Short Term Goal 1 (Week 2): Pt will ambulate with LRAD x50' with max assist PT Short Term Goal 2 (Week 2): Pt will transfer with consistent min assist PT Short Term Goal 3 (Week 2): Pt will initiate stair training with PT PT Short Term Goal 4 (Week 2): Pt will demo static standing balance with RUE support with min assist   Skilled Therapeutic Interventions/Progress Updates:   Pt in w/c upon arrival in care of therapy tech and agreeable to therapy.   Ambulated 15 ft, 20 ft, and 30 ft w/ R rail use in hallway, Max A for L knee block, L LE placement, and weight shifting. Pt also ambulated w/ L AFO. 2nd helper to provide wheelchair follow.   Dynamic standing balance activities w/ Max A to block L knee, Mod-Min A to maintain dynamic standing balance overall. Verbal and tactile cues for increased L quad activation throughout:   -UE reaching tasks, 2-3 min bouts x3  -Lateral weight shifts, 20 x3 reps   -Partial knee bends, 10 x3   -L single leg stance w/ R step taps, 10x2   Pt performed multiple sit to/from stand transfers throughout session w/  touching/steady assist and unilateral UE use.   Ended session in w/c in room, call bell within reach and all needs met.   Therapy Documentation Precautions:  Precautions Precautions: Fall Precaution Comments: Peritoneal dialysis patient; L subluxed shoulder Restrictions Weight Bearing Restrictions: No Pain: Pain Assessment Pain Assessment: No/denies pain  See Function Navigator for Current Functional Status.   Therapy/Group: Individual Therapy  Travarus Trudo K Arnette 01/19/2017, 2:37 PM

## 2017-01-19 NOTE — Progress Notes (Signed)
Occupational Therapy Session Note  Patient Details  Name: Terry Juarez MRN: [id number] Date of Birth: 11/09/1965  Today's Date: 01/19/2017 OT Individual Time: 1030-1130 OT Individual Time Calculation (min): 60 min    Short Term Goals: Week 2:  OT Short Term Goal 1 (Week 2): Pt will complete toilet transfers with consistent min A OT Short Term Goal 2 (Week 2): Pt will utilize L UE as a stabilizer with min questioning cues OT Short Term Goal 3 (Week 2): Pt will complete LB dressing with consistent min A  Skilled Therapeutic Interventions/Progress Updates:    Pt seen this session for ADL retraining, LUE NMR, functional mobility.  Pt received in bed. Prior to getting out of bed, worked on LUE NMR with tricep facilitation with hand drop activity. Tone in triceps was elicited. Slight active bicep movement.  With bed flat sat up with touching A to guide movement patterns. Min A sq pivot to w/c.  Unable to complete bathing as hot water was being shut off for 20 min.  Pt dressed with steadying A as she needed min A to steady balance as she stood to adjust pants over hips.   Cues for safe foot placement as she stands with her feet too close together. Pt responded to cues well.    Estim alternating for finger/wrist flexors and extensors at intensity 20 for 12 min.  During estim, worked on grasp and release of a towel and of a body wash bottle.  Applied wrist cockup splint and lap tray.  Pt resting in w/c with all needs met.   Therapy Documentation Precautions:  Precautions Precautions: Fall Precaution Comments: Peritoneal dialysis patient; L subluxed shoulder Restrictions Weight Bearing Restrictions: No    Vital Signs: Therapy Vitals Temp: 98.8 F (37.1 C) Temp Source: Oral Pulse Rate: 68 Resp: 18 BP: 108/62 Patient Position (if appropriate): Lying Oxygen Therapy SpO2: 99 % O2 Device: Not Delivered Pain: Pain Assessment Pain Assessment: No/denies pain Pain Score: 0-No  pain ADL: ADL ADL Comments: refer to functional navigator  See Function Navigator for Current Functional Status.   Therapy/Group: Individual Therapy  , 01/19/2017, 8:30 AM  

## 2017-01-19 NOTE — Progress Notes (Signed)
Occupational Therapy Note  Patient Details  Name: Terry Juarez MRN: [id number] Date of Birth: Aug 01, 1965  Today's Date: 01/19/2017 OT Concurrent Time: 1300-1400 OT Concurrent Time Calculation (min): 60 min  No c/o pain  Concurrent treatment   NMR with focus on transitional movements with normal patterns of movement, symmetrical core activation, activation of left UE AAROM and overall coordination with movements  -Performed AAROM with ability to initiate movement with decr amt of time with arm skate on table top with VC for trunk postural alignment to decr abnormal movements  -sit <>supine and rolling transitional movements with min to mod A with VC for sequencing and incorporating left UE movement  symmetrical bridging with ball in between LEs    - sit to stands and controlled stand to sit with out UE support with mod A for focus on sustained weight shift forward and in midline and activation in left LE  -in side lying focus on hip extension/ flexion and adduction/ abduction to assist with bed mobility   -Nustep for 5 min on level 3 with left LE modified/ adaptive piece to assist with keep LE at neutral  Left in gym with tech in prep for next session.  Willeen Cass [facility] 01/19/2017, 3:55 PM

## 2017-01-19 NOTE — Progress Notes (Signed)
  [facility] Progress Note   Subjective:  Lying in bed.  Feels better--> no abd pain with the 1.5% last night.      Objective Vitals:   01/18/17 1354 01/18/17 1750 01/19/17 0557 01/19/17 0700  BP: 106/64 99/60 116/81 108/62  Pulse: 72 69 75 68  Resp: 17 16 18 18   Temp: 98.5 F (36.9 C) 98.7 F (37.1 C) 98.9 F (37.2 C) 98.8 F (37.1 C)  TempSrc: Oral Oral Oral Oral  SpO2: 97% 96% 97% 99%  Weight:  47.7 kg (105 lb 2.6 oz) 46.4 kg (102 lb 4.7 oz)   Height:       Physical Exam General: slender NAD HEENT: MMM NECK: no JVD CV Regular S1S2 No S3 PULM Lungs clear ABD: soft NT EXTNo LE edema NEURO Left hemiparesis. ACCESS: PD cath with clear dry dressing R abdomen; Right upper arm AVF aneurysmal/+ bruit   Dialysis:PD 5 exchanges overnight, no day bag, no pause, 2000 volume, using all 1.5%- UF of 1.5 liters   Summary: Pt is a 51 y.o.yo femalewith ESRD on PD who was admitted on 6/16/2018with sz/hemiparesis- found to have right sided frontal lobe hemorrhage, alsowith PRES. Seizures controlled in [place]. Had cardiac arrest 12/25/16 in Inpt HD. Transferred to Rehab 01/04/17.  Assessment/ Plan: 1. Intracranial CVA/ small SAH R frontal, + PRES with L hemiparesis - pt was recovering then had cardiac arrest at inpt dialysis on 12/25/17; transitioned back to PD- making progress again. 2. ESRD- Continue CCPD.  Family in [place] are planning to take her after dc for 6 mos. Use all 1.5%. 3. HTN/volume - stable 4. Cardiac arrest - while on HD 6/22. Refuses to ever consider HD again.  5. Anemia- history of GIB and low hgb. Responded toowell to ESA and and Hb now >13. Holding ESA now 6. MBD - Had hypercalcemia Ca 10.9 on 7/2.   Stopped calcium acetate but then became hypocalcemic (7.2) so resumed Ca acetate and stopped sensipar.   7. Nutrition Alb 2.7 - regular diet ok, has Nepro and vitamins    Recent Labs Lab 01/17/17 0436  NA 136  K 3.8  CL 97*  CO2 25   GLUCOSE 101*  BUN 57*  CREATININE 15.18*  CALCIUM 8.1*  PHOS 5.1*     Recent Labs Lab 01/17/17 0436  ALBUMIN 2.7*     Recent Labs Lab 01/17/17 0436  WBC 7.2  HGB 13.4  HCT 42.0  MCV 99.1  PLT 280    Lab Results  Component Value Date   INR 0.93 12/21/2016   INR 1.88 12/19/2016   INR 0.97 12/19/2016    Medications: . dialysis solution 1.5% low-MG/low-CA     . amLODipine  10 mg Oral Daily  . calcium acetate  667 mg Oral TID WC  . carvedilol  12.5 mg Oral BID WC  . famotidine  20 mg Oral Daily  . feeding supplement (NEPRO CARB STEADY)  237 mL Oral BID BM  . FLUoxetine  20 mg Oral Daily  . gentamicin cream  1 application Topical Daily  . levETIRAcetam  500 mg Oral BID  . levothyroxine  88 mcg Oral QAC breakfast  . multivitamin  1 tablet Oral Daily  . pantoprazole  40 mg Oral BID  . polyethylene glycol  17 g Oral Daily  . senna-docusate  2 tablet Oral BID

## 2017-01-19 NOTE — Progress Notes (Signed)
[place] PHYSICAL MEDICINE & REHABILITATION     PROGRESS NOTE  Subjective/Complaints:   Appreciate Neuropsych consult, discussed result with pt No issues overnite Getting unhooked from PD per HD tech  ROS: Denies CP, SOB, N/V/D.  Objective: Vital Signs: Blood pressure 116/81, pulse 75, temperature 98.9 F (37.2 C), temperature source Oral, resp. rate 18, height 5\' 5"  (1.651 m), weight 46.4 kg (102 lb 4.7 oz), SpO2 97 %. No results found.  Recent Labs  01/17/17 0436  WBC 7.2  HGB 13.4  HCT 42.0  PLT 280    Recent Labs  01/17/17 0436  NA 136  K 3.8  CL 97*  GLUCOSE 101*  BUN 57*  CREATININE 15.18*  CALCIUM 8.1*   CBG (last 3)  No results for input(s): GLUCAP in the last 72 hours.  Wt Readings from Last 3 Encounters:  01/19/17 46.4 kg (102 lb 4.7 oz)  01/04/17 50.7 kg (111 lb 12.4 oz)  09/04/16 54.4 kg (120 lb)    Physical Exam:  BP 116/81 (BP Location: Left Arm)   Pulse 75   Temp 98.9 F (37.2 C) (Oral)   Resp 18   Ht 5\' 5"  (1.651 m)   Wt 46.4 kg (102 lb 4.7 oz)   SpO2 97%   BMI 17.02 kg/m  Constitutional: She appears well-developed. Frail  HENT: Normocephalic and atraumatic.  Eyes: EOMI. No discharge.  Cardiovascular: RRR. No JVD. Respiratory: CTA Bilaterally. Normal effort .  GI: Soft. Bowel sounds are normal.   Musculoskeletal: She exhibits no edema or tenderness.  Neurological: She is alert.  Fair awareness of deficits.  Motor: RUE/RLE: 5/5 LUE 0/5/LLE: 2/5 left Hip/knee ext synergy No increase in tone Skin: Skin is warm and dry.  Psychiatric: Her behavior is normal. Her affect is blunt. She does not engage much   Assessment/Plan: 1. Functional deficits secondary to right frontal hemorrhage, small SAH and signs of PRES which require 3+ hours per day of interdisciplinary therapy in a comprehensive inpatient rehab setting. Physiatrist is providing close team supervision and 24 hour management of active medical problems listed  below. Physiatrist and rehab team continue to assess barriers to discharge/monitor patient progress toward functional and medical goals.  Function:  Bathing Bathing position   Position: Bed  Bathing parts Body parts bathed by patient: Left arm, Chest, Abdomen, Front perineal area, Right upper leg, Left upper leg, Buttocks, Right lower leg, Right arm, Left lower leg Body parts bathed by helper: Back  Bathing assist Assist Level: Touching or steadying assistance(Pt > 75%)      Upper Body Dressing/Undressing Upper body dressing   What is the patient wearing?: Pull over shirt/dress     Pull over shirt/dress - Perfomed by patient: Thread/unthread right sleeve, Thread/unthread left sleeve, Put head through opening, Pull shirt over trunk Pull over shirt/dress - Perfomed by helper: Pull shirt over trunk        Upper body assist Assist Level: Supervision or verbal cues      Lower Body Dressing/Undressing Lower body dressing   What is the patient wearing?: Pants, Underwear, Ted Hose, Shoes, AFO Underwear - Performed by patient: Thread/unthread right underwear leg, Thread/unthread left underwear leg, Pull underwear up/down Underwear - Performed by helper: Pull underwear up/down Pants- Performed by patient: Thread/unthread right pants leg, Thread/unthread left pants leg, Pull pants up/down Pants- Performed by helper: Thread/unthread left pants leg, Pull pants up/down Non-skid slipper socks- Performed by patient: Don/doff right sock Non-skid slipper socks- Performed by helper: Don/doff left sock Socks -  Performed by patient: Don/doff right sock Socks - Performed by helper: Don/doff left sock Shoes - Performed by patient: Fasten right, Don/doff right shoe Shoes - Performed by helper: Don/doff left shoe, Fasten left   AFO - Performed by helper: Don/doff left AFO TED Hose - Performed by patient: Don/doff right TED hose TED Hose - Performed by helper: Don/doff right TED hose, Don/doff left  TED hose  Lower body assist Assist for lower body dressing: Touching or steadying assistance (Pt > 75%)      Toileting Toileting Toileting activity did not occur: No continent bowel/bladder event Toileting steps completed by patient: Performs perineal hygiene Toileting steps completed by helper: Adjust clothing prior to toileting, Adjust clothing after toileting Toileting Assistive Devices: Grab bar or rail  Toileting assist Assist level: Touching or steadying assistance (Pt.75%)   Transfers Chair/bed transfer   Chair/bed transfer method: Squat pivot Chair/bed transfer assist level: Touching or steadying assistance (Pt > 75%) Chair/bed transfer assistive device: Armrests     Locomotion Ambulation     Max distance: 30ft Assist level: Maximal assist (Pt 25 - 49%)   Wheelchair   Type: Manual Max wheelchair distance: 1000 Assist Level: Supervision or verbal cues  Cognition Comprehension Comprehension assist level: Follows complex conversation/direction with extra time/assistive device  Expression Expression assist level: Expresses complex 90% of the time/cues < 10% of the time  Social Interaction Social Interaction assist level: Interacts appropriately 90% of the time - Needs monitoring or encouragement for participation or interaction.  Problem Solving Problem solving assist level: Solves complex 90% of the time/cues < 10% of the time  Memory Memory assist level: Recognizes or recalls 90% of the time/requires cueing < 10% of the time    Medical Problem List and Plan: 1.  Decreased functional mobility secondary to right frontal hemorrhage, small SAH and signs of PRES  Cont CIR PT OT, SLP-team conf in am  PRAFO/WHO   Fluoxetine started 7/3, increased to 20 on 7/10  2.  DVT Prophylaxis/Anticoagulation: SCDs.   Vascular study neg 3. Pain Management: Tylenol as needed 4. Mood: Provide emotional support 5. Neuropsych: This patient is  fully capable of making decisions on her own  behalf.per Neuropsych Eval 6. Skin/Wound Care: Routine skin checks 7. Fluids/Electrolytes/Nutrition: Routine I&Os 8. Seizure prophylaxis. Keppra 500 mg every 12 hours 9. ESRD with history of right renal cancer status post nephrectomy. Continue peritoneal dialysis as per renal services 10. Hypertension. At home Norvasc 5 mg twice daily, Coreg 12.5 mg twice a day, Cozaar 50 mg daily, , amlodipine increased from 5- > 10mg  per Nephro on 7/11, Nephro also adjusting dialysate BP improved 7/17   Vitals:   01/18/17 1750 01/19/17 0557  BP: 99/60 116/81  Pulse: 69 75  Resp: 16 18  Temp: 98.7 F (37.1 C) 98.9 F (37.2 C)   11. Hypothyroidism. Synthroid 12.Constipation. Laxative assistance, last recorded BM, senna S 1 BID- BM 7/15    LOS (Days) 15 A FACE TO FACE EVALUATION WAS PERFORMED  Terry Juarez E 01/19/2017 7:01 AM

## 2017-01-19 NOTE — Progress Notes (Signed)
Occupational Therapy Session Note  Patient Details  Name: Terry Juarez MRN: [id number] Date of Birth: Aug 12, 1965  Today's Date: 01/19/2017 OT Individual Time: 6147-0929 OT Individual Time Calculation (min): 30 min    Short Term Goals: Week 2:  OT Short Term Goal 1 (Week 2): Pt will complete toilet transfers with consistent min A OT Short Term Goal 2 (Week 2): Pt will utilize L UE as a stabilizer with min questioning cues OT Short Term Goal 3 (Week 2): Pt will complete LB dressing with consistent min A  Skilled Therapeutic Interventions/Progress Updates:    OT Treatment session focused on  L UE NMR and Giv-mohr sling fitting. Pt propelled wc to therapy gym, set-up wc for transfer w/ min A to remove LLE leg rest, then completed stand-pivot to L with min A.  Pt fit for proper fit for giv-morh sling for L shoulder support during transfers and functional mobility. Pt then came to sidelying and completed L NMR using UE ranger focused on shoulder flex/ext and elbow flex/ext. Pt returned to bed at end of session w/ min A.   Therapy Documentation Precautions:  Precautions Precautions: Fall Precaution Comments: Peritoneal dialysis patient; L subluxed shoulder Restrictions Weight Bearing Restrictions: No Pain: Pain Assessment Pain Assessment: No/denies pain ADL: ADL ADL Comments: refer to functional navigator  See Function Navigator for Current Functional Status.   Therapy/Group: Individual Therapy  [name] 01/19/2017, 3:52 PM

## 2017-01-20 ENCOUNTER — Inpatient Hospital Stay (HOSPITAL_COMMUNITY): Payer: BLUE CROSS/BLUE SHIELD | Admitting: Occupational Therapy

## 2017-01-20 ENCOUNTER — Inpatient Hospital Stay (HOSPITAL_COMMUNITY): Payer: BLUE CROSS/BLUE SHIELD | Admitting: Physical Therapy

## 2017-01-20 NOTE — Progress Notes (Signed)
Physical Therapy Session Note  Patient Details  Name: Terry Juarez MRN: [id number] Date of Birth: 09/26/1965  Today's Date: 01/20/2017 PT Individual Time: 0900-1000 PT Individual Time Calculation (min): 60 min   Short Term Goals: Week 2:  PT Short Term Goal 1 (Week 2): Pt will ambulate with LRAD x50' with max assist PT Short Term Goal 2 (Week 2): Pt will transfer with consistent min assist PT Short Term Goal 3 (Week 2): Pt will initiate stair training with PT PT Short Term Goal 4 (Week 2): Pt will demo static standing balance with RUE support with min assist   Skilled Therapeutic Interventions/Progress Updates:    Pt handoff from OT in therapy gym. No c/o of pain. PT treatment session focused on NMR of L UE and LE extension carryover during functional tasks.  Pt performed 2x transfers to the L side with min assist with pt education on head and hip relation with verbal and tactile cues for forward trunk lean and weight shifting. Pt reporting increased fatigue of L LE today. Pt performs 2x12 L UE shoulder flexion and extension using UE ranger in sitting position working on NMR.  Pt performed 2x15 B UE pushing on therapy ball with forward trunk lean working on UE NMR and forward lean for sit to stands. Pt required mod assist to maintain L UE placement on therapy ball. Pt performed sit to supine with min assist for lifting L LE. Pt performed R sidelying L LE hip and knee extension 2x20 using maxislide for decresaed friction with tactile cues for NMR and motor planning of LE extension. Pt demonstrated increased AROM with L LE extension in sidelying during second set. Pt performed sit to stands throughout session with mod assist for lifting and verbal and tactile cues for forward weight shifting. Pt performed standing terminal knee extension 4 sets of 5 reps without AD and with min assist for trunk control. PT provided verbal and tactile cues for NMR of LE extension with carry over from motor planning  in side lying. Pt returned to room and performed squat pivot transfer to the R with steadying assist and sit to supine with min assist to lift L LE. Pt left in bed in room with RN present, call bell in reach and needs met.   Therapy Documentation Precautions:  Precautions Precautions: Fall Precaution Comments: Peritoneal dialysis patient; L subluxed shoulder Restrictions Weight Bearing Restrictions: No   See Function Navigator for Current Functional Status.   Therapy/Group: Individual Therapy  Carly Pippin 01/20/2017, 4:36 PM  

## 2017-01-20 NOTE — Progress Notes (Signed)
  [facility] Progress Note   Subjective:  Seen in room. No c/os today.     Objective Vitals:   01/19/17 1720 01/19/17 1845 01/20/17 0600 01/20/17 0706  BP: 127/80 112/60 128/78 117/68  Pulse: 75 73 71 68  Resp: 20 18 18    Temp: 99 F (37.2 C) 98.8 F (37.1 C) 98.7 F (37.1 C) 98.5 F (36.9 C)  TempSrc: Oral Oral Oral Oral  SpO2: 97% 98% 97% 97%  Weight:  47.8 kg (105 lb 6.1 oz) 47.9 kg (105 lb 9.6 oz) 47 kg (103 lb 9.9 oz)  Height:       Physical Exam General: slender NAD HEENT: MMM NECK: no JVD CV Regular S1S2 No S3 PULM Lungs clear ABD: soft NT EXTNo LE edema NEURO Left hemiparesis. ACCESS: PD cath with clear dry dressing R abdomen; Right upper arm AVF aneurysmal/+ bruit   Dialysis:PD 5 exchanges overnight, no day bag, no pause, 2000 volume, using all 1.5%- UF of 1.5 liters   Summary: Pt is a 51 y.o.yo femalewith ESRD on PD who was admitted on 6/16/2018with sz/hemiparesis- found to have right sided frontal lobe hemorrhage, alsowith PRES. Seizures controlled in [place]. Had cardiac arrest 12/25/16 in Inpt HD. Transferred to Rehab 01/04/17.  Assessment/ Plan: 1. Intracranial CVA/ small SAH R frontal, + PRES with L hemiparesis - pt was recovering then had cardiac arrest at inpt dialysis on 12/25/17; transitioned back to PD- making progress again. 2. ESRD- Continue CCPD.  Family in [place] are planning to take her after dc for 6 mos. Use all 1.5%. 3. HTN/volume - stable 4. Cardiac arrest - while on HD 6/22. Refuses to ever consider HD again.  5. Anemia- history of GIB and low hgb. Responded toowell to ESA and and Hb now >13. Holding ESA now 6. MBD - Had hypercalcemia Ca 10.9 on 7/2.   Stopped calcium acetate but then became hypocalcemic (7.2) so resumed Ca acetate and stopped sensipar.   7. Nutrition Alb 2.7 - regular diet ok, has Nepro and vitamins  8. Dispo - Anticipated discharge 7/31    Recent Labs Lab 01/17/17 0436  NA 136  K 3.8   CL 97*  CO2 25  GLUCOSE 101*  BUN 57*  CREATININE 15.18*  CALCIUM 8.1*  PHOS 5.1*     Recent Labs Lab 01/17/17 0436  ALBUMIN 2.7*     Recent Labs Lab 01/17/17 0436  WBC 7.2  HGB 13.4  HCT 42.0  MCV 99.1  PLT 280    Lab Results  Component Value Date   INR 0.93 12/21/2016   INR 1.88 12/19/2016   INR 0.97 12/19/2016    Medications: . dialysis solution 1.5% low-MG/low-CA     . amLODipine  10 mg Oral Daily  . calcium acetate  667 mg Oral TID WC  . carvedilol  12.5 mg Oral BID WC  . famotidine  20 mg Oral Daily  . feeding supplement (NEPRO CARB STEADY)  237 mL Oral BID BM  . FLUoxetine  20 mg Oral Daily  . gentamicin cream  1 application Topical Daily  . levETIRAcetam  500 mg Oral BID  . levothyroxine  88 mcg Oral QAC breakfast  . multivitamin  1 tablet Oral Daily  . pantoprazole  40 mg Oral BID  . polyethylene glycol  17 g Oral Daily  . senna-docusate  2 tablet Oral BID

## 2017-01-20 NOTE — Progress Notes (Signed)
Occupational Therapy Session Note  Patient Details  Name: Terry Juarez MRN: [id number] Date of Birth: [birth year]  Today's Date: 01/20/2017 OT Individual Time: 1300-1359 OT Individual Time Calculation (min): 59 min    Short Term Goals: Week 2:  OT Short Term Goal 1 (Week 2): Pt will complete toilet transfers with consistent min A OT Short Term Goal 2 (Week 2): Pt will utilize L UE as a stabilizer with min questioning cues OT Short Term Goal 3 (Week 2): Pt will complete LB dressing with consistent min A  Skilled Therapeutic Interventions/Progress Updates:    Pt received supine in bed, ready for OT tx session, requesting to shower during session. Pt comes to sitting EOB with MinA, MInA for stand pivot transfer to w/c and w/c to tubbench using grab bar. Pt washes all body parts with assist for washing back and HOH assist for washing RUE. MinA to steady while Pt washes LEs in sitting. ModA to steady in standing while Pt washes perineal area and buttocks. Pt requires MaxA for stand pivot from tub bench to w/c using grab bar (increased assist as Pt transferring to L and on shower surface). Pt completed UB/LB dressing seated in w/c at sink with good demonstration of hemi techniques. Setup assist provided for clothing items and ModA to steady in standing for pulling pants/underwear over hips, therapist assist to pull over L hip. Pt dons R shoe, assist required for L shoe/AFO. Pt then completed sit<>stand w/c to hemiwalker with [place] (progresses to [place]). Pt stands while pulling theraband over hips to simulate LB dressing task with Pt completing x2 trials, therapist providing mod steady assist during task completion. Pt left seated in w/c, call bell and needs within reach.   Therapy Documentation Precautions:  Precautions Precautions: Fall Precaution Comments: Peritoneal dialysis patient; L subluxed shoulder Restrictions Weight Bearing Restrictions: No   Pain: Pain Assessment Pain Assessment:  No/denies pain ADL: ADL ADL Comments: refer to functional navigator  See Function Navigator for Current Functional Status.   Therapy/Group: Individual Therapy  Raymondo Band 01/20/2017, 4:00 PM

## 2017-01-20 NOTE — Progress Notes (Signed)
[facility]     PROGRESS NOTE  Subjective/Complaints:   No issues overnite  ROS: Denies CP, SOB, N/V/D.  Objective: Vital Signs: Blood pressure 128/78, pulse 71, temperature 98.7 F (37.1 C), temperature source Oral, resp. rate 18, height _0  (1.651 m), weight 47.9 kg (105 lb 9.6 oz), SpO2 97 %. No results found. No results for input(s): WBC, HGB, HCT, PLT in the last 72 hours. No results for input(s): NA, K, CL, GLUCOSE, BUN, CREATININE, CALCIUM in the last 72 hours.  Invalid input(s): CO CBG (last 3)  No results for input(s): GLUCAP in the last 72 hours.  Wt Readings from Last 3 Encounters:  01/20/17 47.9 kg (105 lb 9.6 oz)  01/04/17 50.7 kg (111 lb 12.4 oz)  09/04/16 54.4 kg (120 lb)    Physical Exam:  BP 128/78 (BP Location: Left Wrist)   Pulse 71   Temp 98.7 F (37.1 C) (Oral)   Resp 18   Ht _1  (1.651 m)   Wt 47.9 kg (105 lb 9.6 oz)   SpO2 97%   BMI 17.57 kg/m  Constitutional: She appears well-developed. Frail  HENT: Normocephalic and atraumatic.  Eyes: EOMI. No discharge.  Cardiovascular: RRR. No JVD. Respiratory: CTA Bilaterally. Normal effort .  GI: Soft. Bowel sounds are normal.   Musculoskeletal: She exhibits no edema or tenderness.  Neurological: She is alert.  Fair awareness of deficits.  Motor: RUE/RLE: 5/5 LUE 0/5 except 2- triceps /LLE: 2/5 left Hip/knee ext synergy No increase in tone Skin: Skin is warm and dry.  Psychiatric: Her behavior is normal. Her affect is blunt. She does not engage much   Assessment/Plan: 1. Functional deficits secondary to right frontal hemorrhage, small SAH and signs of PRES which require 3+ hours per day of interdisciplinary therapy in a comprehensive inpatient rehab setting. Physiatrist is providing close team supervision and 24 hour management of active medical problems listed below. Physiatrist and rehab team continue to assess barriers to discharge/monitor patient progress  toward functional and medical goals.  Function:  Bathing Bathing position   Position: Bed  Bathing parts Body parts bathed by patient: Left arm, Chest, Abdomen, Front perineal area, Right upper leg, Left upper leg, Buttocks, Right lower leg, Right arm, Left lower leg Body parts bathed by helper: Back  Bathing assist Assist Level: Touching or steadying assistance(Pt > 75%)      Upper Body Dressing/Undressing Upper body dressing   What is the patient wearing?: Pull over shirt/dress     Pull over shirt/dress - Perfomed by patient: Thread/unthread right sleeve, Thread/unthread left sleeve, Put head through opening, Pull shirt over trunk Pull over shirt/dress - Perfomed by helper: Pull shirt over trunk        Upper body assist Assist Level: Supervision or verbal cues      Lower Body Dressing/Undressing Lower body dressing   What is the patient wearing?: Pants, Underwear, Ted Hose, Shoes, AFO Underwear - Performed by patient: Thread/unthread right underwear leg, Thread/unthread left underwear leg, Pull underwear up/down Underwear - Performed by helper: Pull underwear up/down Pants- Performed by patient: Thread/unthread right pants leg, Thread/unthread left pants leg, Pull pants up/down Pants- Performed by helper: Thread/unthread left pants leg, Pull pants up/down Non-skid slipper socks- Performed by patient: Don/doff right sock Non-skid slipper socks- Performed by helper: Don/doff left sock Socks - Performed by patient: Don/doff right sock Socks - Performed by helper: Don/doff left sock Shoes - Performed by patient: Fasten right, Don/doff right shoe Shoes -  Performed by helper: Don/doff left shoe, Fasten left   AFO - Performed by helper: Don/doff left AFO TED Hose - Performed by patient: Don/doff right TED hose TED Hose - Performed by helper: Don/doff right TED hose, Don/doff left TED hose  Lower body assist Assist for lower body dressing: Touching or steadying assistance (Pt >  75%)      Toileting Toileting Toileting activity did not occur: No continent bowel/bladder event Toileting steps completed by patient: Performs perineal hygiene Toileting steps completed by helper: Adjust clothing prior to toileting, Adjust clothing after toileting Toileting Assistive Devices: Grab bar or rail  Toileting assist Assist level: Touching or steadying assistance (Pt.75%)   Transfers Chair/bed transfer   Chair/bed transfer method: Stand pivot Chair/bed transfer assist level: Touching or steadying assistance (Pt > 75%) Chair/bed transfer assistive device: Armrests     Locomotion Ambulation     Max distance: 20f Assist level: Maximal assist (Pt 25 - 49%)   Wheelchair   Type: Manual Max wheelchair distance: 150+ ft Assist Level: Supervision or verbal cues  Cognition Comprehension Comprehension assist level: Follows complex conversation/direction with extra time/assistive device  Expression Expression assist level: Expresses basic 90% of the time/requires cueing < 10% of the time.  Social Interaction Social Interaction assist level: Interacts appropriately 90% of the time - Needs monitoring or encouragement for participation or interaction.  Problem Solving Problem solving assist level: Solves complex 90% of the time/cues < 10% of the time  Memory Memory assist level: Recognizes or recalls 90% of the time/requires cueing < 10% of the time    Medical Problem List and Plan: 1.  Decreased functional mobility secondary to right frontal hemorrhage, small SAH and signs of PRES  Cont CIR PT OT, SLP-Team conference today please see physician documentation under team conference tab, met with team face-to-face to discuss problems,progress, and goals. Formulized individual treatment plan based on medical history, underlying problem and comorbidities.  PRAFO/WHO   Fluoxetine started 7/3, increased to 20 on 7/10  2.  DVT Prophylaxis/Anticoagulation: SCDs.   Vascular study neg 3.  Pain Management: Tylenol as needed 4. Mood: Provide emotional support 5. Neuropsych: This patient is  fully capable of making decisions on her own behalf.per Neuropsych Eval 6. Skin/Wound Care: Routine skin checks 7. Fluids/Electrolytes/Nutrition: Routine I&Os 8. Seizure prophylaxis. Keppra 500 mg every 12 hours 9. ESRD with history of right renal cancer status post nephrectomy. Continue peritoneal dialysis as per renal services 10. Hypertension. At home Norvasc 5 mg twice daily, Coreg 12.5 mg twice a day, Cozaar 50 mg daily, , amlodipine increased from 5- > 184mper Nephro on 7/11, Nephro also adjusting dialysate BP improved 7/18   Vitals:   01/19/17 1845 01/20/17 0600  BP: 112/60 128/78  Pulse: 73 71  Resp: 18 18  Temp: 98.8 F (37.1 C) 98.7 F (37.1 C)   11. Hypothyroidism. Synthroid 12.Constipation. Laxative assistance, last recorded BM, senna S 1 BID- BM 7/17    LOS (Days) 16 A FACE TO FACE EVALUATION WAS PERFORMED  KICharlett Blake/18/2018 6:23 AM

## 2017-01-20 NOTE — Progress Notes (Signed)
Occupational Therapy Session Note  Patient Details  Name: Terry Juarez MRN: [id number] Date of Birth: [birth year]  Today's Date: 01/20/2017 OT Individual Time: 1445-1540 OT Individual Time Calculation (min): 55 min    Short Term Goals: Week 2:  OT Short Term Goal 1 (Week 2): Pt will complete toilet transfers with consistent min A OT Short Term Goal 2 (Week 2): Pt will utilize L UE as a stabilizer with min questioning cues OT Short Term Goal 3 (Week 2): Pt will complete LB dressing with consistent min A  Skilled Therapeutic Interventions/Progress Updates: Pt propelled w/c outside with education provided for community safety awareness from wc level. Pt able to access elevator, push buttons, and propel wc along graded surfaces.  1:1 NMES applied to supraspinatus and middle deltoid to help approximate shoulder joint to reduce sublux and reduce pain.   Ratio 1:1 Rate 35 pps Waveform- Asymmetric Ramp 1.0 Pulse 300 Intensity- 23 Duration -  15   Report of pain at the beginning of session 4 Report of pain at the end of session 0  No adverse reactions after treatment and is skin intact.   OT then applied new kinesiotape to L shoulder to provide support to shoulder joint 2/2 subluxation. Pt returned to room at end of session, completed stand-pivot to R w/ Min A and left with needs met and L UE supported.      Therapy Documentation Precautions:  Precautions Precautions: Fall Precaution Comments: Peritoneal dialysis patient; L subluxed shoulder Restrictions Weight Bearing Restrictions: No ADL: ADL ADL Comments: refer to functional navigator  See Function Navigator for Current Functional Status.  Therapy/Group: Individual Therapy  [name] 01/20/2017, 3:39 PM

## 2017-01-20 NOTE — Progress Notes (Signed)
Occupational Therapy Session Note  Patient Details  Name: Terry Juarez MRN: [id number] Date of Birth: 1965-09-11  Today's Date: 01/20/2017 OT Individual Time: 0830-0900 OT Individual Time Calculation (min): 30 min    Short Term Goals: Week 2:  OT Short Term Goal 1 (Week 2): Pt will complete toilet transfers with consistent min A OT Short Term Goal 2 (Week 2): Pt will utilize L UE as a stabilizer with min questioning cues OT Short Term Goal 3 (Week 2): Pt will complete LB dressing with consistent min A Week 3:     Skilled Therapeutic Interventions/Progress Updates:    1:1 Pt in bed when arrived. Focused on dressing EOB with attention to incorporating activation of left Ue/Le, dynamic sitting balance, sitting balance reactions, sit to stand, controlled standing with manual facilitation at glut and knee and standing tolerance. Pt does require min A for dynamic sitting balance- esp when shifting weight towards the left. Squat pivot transfers with focus on maintaining forward weight shift (esp with transfers towards the left) with minimal tactile cues. Pt able to tolerate standing with min A at the sink with mod to max cues and facilitation for terminal hip and knee extension while brushing teeth (once set up and washing face). Left with next therapist in the gym.  Therapy Documentation Precautions:  Precautions Precautions: Fall Precaution Comments: Peritoneal dialysis patient; L subluxed shoulder Restrictions Weight Bearing Restrictions: No Pain: Pain Assessment Pain Assessment: No/denies pain  See Function Navigator for Current Functional Status.   Therapy/Group: Individual Therapy  Willeen Cass [facility] 01/20/2017, 10:39 AM

## 2017-01-20 NOTE — Patient Care Conference (Signed)
Inpatient RehabilitationTeam Conference and Plan of Care Update Date: 01/20/2017   Time: 11:15 AM    Patient Name: Terry Juarez      Medical Record Number: [id number]  Date of Birth: Nov 10, 1965 Sex: Female         Room/Bed: 4M01C/4M01C-01 Payor Info: Payor: BLUE CROSS BLUE SHIELD / Plan: BCBS OTHER / Product Type: *No Product type* /    Admitting Diagnosis: Rt Frontal Hemorr hage  Admit Date/Time:  01/04/2017  6:41 PM Admission Comments: No comment available   Primary Diagnosis:  <principal problem not specified> Principal Problem: <principal problem not specified>  Patient Active Problem List   Diagnosis Date Noted  . Flaccid hemiplegia of left nondominant side as late effect of nontraumatic intraparenchymal hemorrhage of brain ([place])   . ESRD on dialysis ([place])   . Leukocytosis   . Hyperglycemia   . Hypertensive emergency 01/04/2017  . Acute left hemiparesis ([place]) 01/04/2017  . Intracranial hemorrhage ([place]) 01/04/2017  . Subarachnoid hematoma ([place])   . Hypothyroidism   . Benign essential HTN   . Seizure prophylaxis   . Cardiac arrest, cause unspecified ([place])   . Acute respiratory failure with hypoxemia ([place])   . Acute encephalopathy   . ICH (intracerebral hemorrhage) ([place])   . Seizure ([place])   . PRES (posterior reversible encephalopathy syndrome)   . Subarachnoid hemorrhage 12/19/2016  . Symptomatic anemia 07/29/2016  . Cough 01/09/2016  . Elevated troponin 02/12/2015  . Headache 02/12/2015  . Syncope 02/12/2015  . ESRD on peritoneal dialysis ([place]) 02/12/2015  . Hypertension   . Hyperlipidemia   . Anemia   . Essential hypertension   . Cephalalgia   . Right renal mass 07/25/2014    Expected Discharge Date: Expected Discharge Date: 02/02/17  Team Members Present: Physician leading conference: Dr. Alysia Penna Social Worker Present: Alfonse Alpers, LCSW Nurse Present: Other (comment) Santiago Glad Lovena Neighbours, RN) PT Present: Dwyane Dee, PT OT Present: Cherylynn Ridges, OT SLP Present: Weston Anna, SLP PPS Coordinator present : Daiva Nakayama, RN, CRRN     Current Status/Progress Goal Weekly Team Focus  Medical   Right frontal hemorrhage, left upper and lower limbs, still weak, but getting some proximal return.  Maintain adequate blood pressure, nephrology to manage peritoneal dialysis,  Management of peritoneal dialysis, assessment of mood   Bowel/Bladder   peritonieal dialysis/anuric/ continent of bowel with moderat assistance, last BM 01/19/17  continue to be continent  Assess BM prn.q shft   Swallow/Nutrition/ Hydration             ADL's   min A overall with self care/ transfers, sit to stand, static stand; trace movement in L sh and bicep  Supervision overall  sitting balance, LUE NMR, estim, ADL retraining   Mobility   mod assist bed mobility, min-mod assist squat pivot, mod-max assit gait with hemiwalker, mod-I w/c  mod-I w/c, supervision with bed mobility and transfers, min assist for ambulation with LRAD  ambulation, NMR of L LE, transfers, posture   Communication             Safety/Cognition/ Behavioral Observations  no unsafe behavior  supervision  recall and complex problem solving   Pain   no c/o pain and discomfort  pain less than or equal to 2  assess for pain q shift and prn   Skin   no skin issues noted skin dry and intact dressing to peritoneal dialysis site clean, dry and  intact  keep skin dry and intact with no  infection or breakdown  assess skin ever    Rehab Goals Patient on target to meet rehab goals: Yes Rehab Goals Revised: none *See Care Plan and progress notes for long and short-term goals.     Barriers to Discharge  Current Status/Progress Possible Resolutions Date Resolved   Physician    Inaccessible home environment;Medical stability  Psychology evaluation, training of caregiver     Moving to another home that is more accessible      [place] home environment;Other (comments)    Brother-in-law is working on a ramp and setting up PD training.          Discharge Planning/Teaching Needs:  Pt to d/c to her brother-in-law and sister-in-law's home in [place], [place].  Family to begin family training this week on CIR.  They will have PD training after d/c.   Team Discussion:  Pt is doing well medically and is continent.  She is regaining movement in left arm.  Grounds pass to be issued for pt.  ST has d/c'd pt due to goals met and this will allow pt more time to have PT/OT.  Revisions to Treatment Plan:  none    Continued Need for Acute Rehabilitation Level of Care: The patient requires daily medical management by a physician with specialized training in physical medicine and rehabilitation for the following conditions: Daily direction of a multidisciplinary physical rehabilitation program to ensure safe treatment while eliciting the highest outcome that is of practical value to the patient.: Yes Daily medical management of patient stability for increased activity during participation in an intensive rehabilitation regime.: Yes Daily analysis of laboratory values and/or radiology reports with any subsequent need for medication adjustment of medical intervention for : Neurological problems;Mood/behavior problems  [name], [name] 01/20/2017, 1:27 PM

## 2017-01-21 ENCOUNTER — Inpatient Hospital Stay (HOSPITAL_COMMUNITY): Payer: BLUE CROSS/BLUE SHIELD | Admitting: Physical Therapy

## 2017-01-21 ENCOUNTER — Inpatient Hospital Stay (HOSPITAL_COMMUNITY): Payer: BLUE CROSS/BLUE SHIELD | Admitting: Occupational Therapy

## 2017-01-21 LAB — HEPATITIS B SURFACE ANTIGEN: HEP B S AG: NEGATIVE

## 2017-01-21 NOTE — Progress Notes (Signed)
Occupational Therapy Weekly Progress Note  Patient Details  Name: Terry Juarez MRN: [id number] Date of Birth: [birth year]  Beginning of progress report period: January 05, 2017 End of progress report period: January 21, 2017  Today's Date: 01/21/2017 OT Individual Time: 1100-1200 OT Individual Time Calculation (min): 60 min   \Patient has met 3 of 3 short term goals.  Pt is making steady progress towards OT treatments at this time. She is completing transfers to L and R more consistently at a min A level. Pt continues to have some difficulty with head/hips relationship when transferring L, but is improving. Pt has had some slight return of L UE function, proximal greater than distal. She is doing well with bathing/dressing using hemi-techniques, and has progressed to overall min A/supervision. Pt is working hard in therapy and is making great progress towards her mod I/supervision goals.  Patient continues to demonstrate the following deficits: muscle weakness, impaired timing and sequencing, abnormal tone, unbalanced muscle activation and decreased coordination and decreased sitting balance, decreased standing balance, decreased postural control, hemiplegia and decreased balance strategies and therefore will continue to benefit from skilled OT intervention to enhance overall performance with BADL and Reduce care partner burden.  Patient progressing toward long term goals..  Continue plan of care.  OT Short Term Goals Week 2:  OT Short Term Goal 1 (Week 2): Pt will complete toilet transfers with consistent min A OT Short Term Goal 1 - Progress (Week 2): Met OT Short Term Goal 2 (Week 2): Pt will utilize L UE as a stabilizer with min questioning cues OT Short Term Goal 2 - Progress (Week 2): Met OT Short Term Goal 3 (Week 2): Pt will complete LB dressing with consistent min A OT Short Term Goal 3 - Progress (Week 2): Met Week 3:  OT Short Term Goal 1 (Week 3): Pt will complete L UE self-ROM without  assistance OT Short Term Goal 2 (Week 3): Pt will maintain standing balance with min A while pulling pants over hips during BADLs OT Short Term Goal 3 (Week 3): Pt will independently place LUE into functional position during self-care tasks to improve awareness of LUE/minimize UE injury  Skilled Therapeutic Interventions/Progress Updates:    OT treatment session focused on L UE NMR, standing balance, and NMES. Pt propelled wc to therapy gym. Addressed standing balance and L NMR with standing weight bearing task pushing wash cloth to increase proprioceptive feedback to L UE. Pt required min/mod A for balance with L knee block- 3 setsx10 with rest breaks in between. L UE NMR focused on forearm pronation- OT brought pt's forearm into supination, then pt able to actively pronate. Pt completed 10 reps of 10 with increased activation with more reps. Pt then completed 15 mins of NMES focused on wrist extension. Pt then alternated active pronation in between wave forms. Pt returned to room and left seated in wc with L UE supported on lap tray awaiting lunch. Call bell in reach.   Therapy Documentation Precautions:  Precautions Precautions: Fall Precaution Comments: Peritoneal dialysis patient; L subluxed shoulder Restrictions Weight Bearing Restrictions: No GPain:  none/denies pain ADL: ADL ADL Comments: refer to functional navigator  See Function Navigator for Current Functional Status.   Therapy/Group: Individual Therapy  [name] 01/21/2017, 11:33 AM

## 2017-01-21 NOTE — Progress Notes (Signed)
Physical Therapy Session Note  Patient Details  Name: HEAVENLY CHRISTINE MRN: [id number] Date of Birth: 06/21/1966  Today's Date: 01/21/2017 PT Individual Time: 0915-1010 PT Individual Time Calculation (min): 55 min   Short Term Goals: Week 1:  PT Short Term Goal 1 (Week 1): Pt will transfer from bed to w/c with moderate assist in either direction  PT Short Term Goal 1 - Progress (Week 1): Met PT Short Term Goal 2 (Week 1): Pt will perform all bed mobility with min assist PT Short Term Goal 2 - Progress (Week 1): Met PT Short Term Goal 3 (Week 1): Pt will demonstrate dynamic standing balance for functional tasks requiring moderate assistance PT Short Term Goal 3 - Progress (Week 1): Not met PT Short Term Goal 4 (Week 1): Pt will propel w/c 50 ft in a controlled environment with supervision  PT Short Term Goal 4 - Progress (Week 1): Met Week 2:  PT Short Term Goal 1 (Week 2): Pt will ambulate with LRAD x50' with max assist PT Short Term Goal 2 (Week 2): Pt will transfer with consistent min assist PT Short Term Goal 3 (Week 2): Pt will initiate stair training with PT PT Short Term Goal 4 (Week 2): Pt will demo static standing balance with RUE support with min assist   Skilled Therapeutic Interventions/Progress Updates:   Pt supine in bed upon arrival and agreeable to therapy. Transferred pt supine<>EOB w/ Mod A and assisted pt in changing clothes and donning shoes.   Pt performed multiple bed<>chair transfers to both R/L sides w/ Min A to touching/steady assist. Verbal cues for hand placement and technique. Tactile cues for hip placement during transfer.   Pt propelled w/c to/from therapy gym w/ supervision, 150+ ft each way. No signs of fatigue.   Worked on [employer] while sitting edge of mat w/ close supevision, Mod A to correct LOB x2  -Dynamic sitting balance reaching outside of BOS w/ RUE, emphasis on weight-shifting to L side  -Dynamic sitting balance while kicking ball back and forth w/  RLE, emphasis on weight-shifitng to L side   Gait training:  -60f, 128fw/ hemi-walker; Max A to block L knee and L foot placement/clearance, verbal and tactile cues to activate L quad   -pre-gait activities w/ L single leg stance and R hemi-walker use, Max A to block L knee for R step forward and back   -weight shifting in stance w/ verbal and tactile cues to activate L quad during L weight acceptance   Ended session supine in bed, bed alarm on, call bell within reach and all needs met.   Therapy Documentation Precautions:  Precautions Precautions: Fall Precaution Comments: Peritoneal dialysis patient; L subluxed shoulder Restrictions Weight Bearing Restrictions: No  See Function Navigator for Current Functional Status.   Therapy/Group: Individual Therapy  Noreta Kue K Arnette 01/21/2017, 11:56 AM

## 2017-01-21 NOTE — Progress Notes (Signed)
Social Work Patient ID: Terry Juarez, female   DOB: [birth year], 51 y.o.   MRN: [id number]   CSW met with pt 01-20-17 and later spoke with her brother-in-law via telephone to update them on team conference discussion.  Pt is still on target for 02-02-17 d/c.  Pt smiled when we talked about d/c and pt's brother-in-law is making arrangements for pt to come home and for he and his wife to be trained on PD here in [place] before they return to [place], [place].  Ramp is being built at his home currently.  Family is coming 01-22-17 to go through therapies with pt to ask questions of the therapists and to see pt's progress.  CSW will be assisting with d/c arrangements and will continue to follow.

## 2017-01-21 NOTE — Progress Notes (Signed)
  [facility] Progress Note   Subjective:  Seen in room. No c/os. Good spirits.      Objective Vitals:   01/20/17 0706 01/20/17 1447 01/21/17 0456 01/21/17 1242  BP: 117/68 104/72 132/86 133/88  Pulse: 68 77 69 75  Resp:  18 18 18   Temp: 98.5 F (36.9 C) 98 F (36.7 C) 98.7 F (37.1 C) 98.2 F (36.8 C)  TempSrc: Oral Oral Oral Oral  SpO2: 97% 97% 96% 99%  Weight: 47 kg (103 lb 9.9 oz)  47.9 kg (105 lb 10 oz)   Height:       Physical Exam General: slender NAD HEENT: MMM NECK: no JVD CV Regular S1S2 No S3 PULM Lungs clear ABD: soft NT EXTNo LE edema NEURO Left hemiparesis. ACCESS: PD cath with clear dry dressing R abdomen; Right upper arm AVF aneurysmal/+ bruit   Dialysis:PD 5 exchanges overnight, no day bag, no pause, 2000 volume, using all 1.5%- UF of 1.5 liters   Summary: Pt is a 51 y.o.yo femalewith ESRD on PD who was admitted on 6/16/2018with sz/hemiparesis- found to have right sided frontal lobe hemorrhage, alsowith PRES. Seizures controlled in [place]. Had cardiac arrest 12/25/16 in Inpt HD. Transferred to Rehab 01/04/17.  Assessment/ Plan: 1. Intracranial CVA/ small SAH R frontal, + PRES with L hemiparesis - pt was recovering then had cardiac arrest at inpt dialysis on 12/25/16; transitioned back to PD- making progress again. 2. ESRD- Continue CCPD.  Family in [place] are planning to take her after dc for 6 mos. Brother-in-law to do PD training in [place]. Use all 1.5%. 3. HTN/volume - stable 4. Cardiac arrest - while on HD 6/22. Refuses to ever consider HD again.  5. Anemia- history of GIB and low hgb. Responded toowell to ESA and and Hb now >13. Holding ESA now 6. MBD - Had hypercalcemia Ca 10.9 on 7/2.   Stopped calcium acetate but then became hypocalcemic (7.2) so resumed Ca acetate and stopped sensipar.   7. Nutrition Alb 2.7 - regular diet ok, has Nepro and vitamins  8. Dispo - Anticipated discharge 7/31    Recent Labs Lab  01/17/17 0436  NA 136  K 3.8  CL 97*  CO2 25  GLUCOSE 101*  BUN 57*  CREATININE 15.18*  CALCIUM 8.1*  PHOS 5.1*     Recent Labs Lab 01/17/17 0436  ALBUMIN 2.7*     Recent Labs Lab 01/17/17 0436  WBC 7.2  HGB 13.4  HCT 42.0  MCV 99.1  PLT 280    Lab Results  Component Value Date   INR 0.93 12/21/2016   INR 1.88 12/19/2016   INR 0.97 12/19/2016    Medications: . dialysis solution 1.5% low-MG/low-CA     . amLODipine  10 mg Oral Daily  . calcium acetate  667 mg Oral TID WC  . carvedilol  12.5 mg Oral BID WC  . famotidine  20 mg Oral Daily  . feeding supplement (NEPRO CARB STEADY)  237 mL Oral BID BM  . FLUoxetine  20 mg Oral Daily  . gentamicin cream  1 application Topical Daily  . levETIRAcetam  500 mg Oral BID  . levothyroxine  88 mcg Oral QAC breakfast  . multivitamin  1 tablet Oral Daily  . pantoprazole  40 mg Oral BID  . polyethylene glycol  17 g Oral Daily  . senna-docusate  2 tablet Oral BID

## 2017-01-21 NOTE — Progress Notes (Signed)
Physical Therapy Session Note  Patient Details  Name: Terry Juarez MRN: [id number] Date of Birth: February 14, 1966  Today's Date: 01/21/2017 PT Individual Time: 1445-1600 PT Individual Time Calculation (min): 75 min   Short Term Goals: Week 2:  PT Short Term Goal 1 (Week 2): Pt will ambulate with LRAD x50' with max assist PT Short Term Goal 2 (Week 2): Pt will transfer with consistent min assist PT Short Term Goal 3 (Week 2): Pt will initiate stair training with PT PT Short Term Goal 4 (Week 2): Pt will demo static standing balance with RUE support with min assist   Skilled Therapeutic Interventions/Progress Updates:    no c/o of pain. PT treatment session focused on NMR of the L LE during functional activities and NMR of the L UE.  Pt supine in bed on arrival, agreeable to PT treatment session. Pt supine to sit with increased time and supervision for safety. Pt able to don R shoe independently while maintaining sitting balance edge of bed. PT donned L shoe. Pt performed squat pivot transfers from bed <> w/c <> mat with min assist for pivoting throughout session. Pt sit to supine with min assist to lift L LE. Pt performed L LE hip and knee extension and flexion in sidelying with maxislide for decreased friction to work on motor planning of those movements for carry over during functional activities. Pt performed R LE step ups (1") x3 to fatigue with mod progressing to min assist for trunk control and L LE extension. PT provided tactile and verbal cues for L LE knee extension and tactile cues for weight shifting onto L LE with noted mproved knee extension control with full weight shifting. Pt ambulated 2x35 ft with hemiwalker in R UE and a rest break in between with mod assist for trunk control and L LE swing phase with noted decreased assistance needed for stance control during first walk. PT provided tactile cues at L quadriceps and verbal cues for "pushing" into the L LE for carry over from sidelying  motor planning. Pt performed seated L UE pronation/supination x4 each with max multimodal cues for technique and muscle activation. Pt ambulated 32f with hemiwalker in R UE with mod assist for L LE swing phase and min assist for trunk control. PT used music feedback for tempo and provided tactile cues for L knee extension and L weight shift during stance phase. Pt had improved L LE knee extension for stance control during the last walk compared to the first two. Pt performed L UE and LE w/c mobility 1577fto room mod-I. Pt transferred to R w/c to bed with min assist for pivoting and verbal cues to remove w/c armrest. Pt left supine in bed with call bell in reach and needs met.  Therapy Documentation Precautions:  Precautions Precautions: Fall Precaution Comments: Peritoneal dialysis patient; L subluxed shoulder Restrictions Weight Bearing Restrictions: No  See Function Navigator for Current Functional Status.   Therapy/Group: Individual Therapy  Rosenda Geffrard 01/21/2017, 5:23 PM

## 2017-01-21 NOTE — Progress Notes (Signed)
Orthopedic Tech Progress Note Patient Details:  Terry Juarez [birth year] [id number]  Ortho Devices Ortho Device/Splint Location: AFO Left Ordered Via Advanced Ortho Device/Splint Interventions: Boris Lown 01/21/2017, 11:17 AM

## 2017-01-21 NOTE — Progress Notes (Signed)
[facility] PHYSICAL MEDICINE & REHABILITATION     PROGRESS NOTE  Subjective/Complaints:   No issues overnite, no arm or leg pain or spasms  ROS: Denies CP, SOB, N/V/D.  Objective: Vital Signs: Blood pressure 132/86, pulse 69, temperature 98.7 F (37.1 C), temperature source Oral, resp. rate 18, height 5\' 5"  (1.651 m), weight 47.9 kg (105 lb 10 oz), SpO2 96 %. No results found. No results for input(s): WBC, HGB, HCT, PLT in the last 72 hours. No results for input(s): NA, K, CL, GLUCOSE, BUN, CREATININE, CALCIUM in the last 72 hours.  Invalid input(s): CO CBG (last 3)  No results for input(s): GLUCAP in the last 72 hours.  Wt Readings from Last 3 Encounters:  01/21/17 47.9 kg (105 lb 10 oz)  01/04/17 50.7 kg (111 lb 12.4 oz)  09/04/16 54.4 kg (120 lb)    Physical Exam:  BP 132/86 (BP Location: Left Arm)   Pulse 69   Temp 98.7 F (37.1 C) (Oral)   Resp 18   Ht 5\' 5"  (1.651 m)   Wt 47.9 kg (105 lb 10 oz)   SpO2 96%   BMI 17.58 kg/m  Constitutional: She appears well-developed. Frail  HENT: Normocephalic and atraumatic.  Eyes: EOMI. No discharge.  Cardiovascular: RRR. No JVD. Respiratory: CTA Bilaterally. Normal effort .  GI: Soft. Bowel sounds are normal.   Musculoskeletal: She exhibits no edema or tenderness.  Neurological: She is alert.  Fair awareness of deficits.  Motor: RUE/RLE: 5/5 LUE 0/5 except 2- triceps /LLE: 2/5 left Hip/knee ext synergy, L foot drop No increase in tone Tone: Asworth 1 in arm adductors o/w 0 in UE Skin: Skin is warm and dry.  Psychiatric: Her behavior is normal. Her affect is blunt. She does not engage much   Assessment/Plan: 1. Functional deficits secondary to right frontal hemorrhage, small SAH and signs of PRES which require 3+ hours per day of interdisciplinary therapy in a comprehensive inpatient rehab setting. Physiatrist is providing close team supervision and 24 hour management of active medical problems listed  below. Physiatrist and rehab team continue to assess barriers to discharge/monitor patient progress toward functional and medical goals.  Function:  Bathing Bathing position   Position: Shower  Bathing parts Body parts bathed by patient: Left arm, Chest, Abdomen, Front perineal area, Right upper leg, Left upper leg, Buttocks, Right lower leg, Right arm, Left lower leg Body parts bathed by helper: Back  Bathing assist Assist Level: Touching or steadying assistance(Pt > 75%)      Upper Body Dressing/Undressing Upper body dressing   What is the patient wearing?: Pull over shirt/dress     Pull over shirt/dress - Perfomed by patient: Thread/unthread right sleeve, Thread/unthread left sleeve, Put head through opening, Pull shirt over trunk Pull over shirt/dress - Perfomed by helper: Pull shirt over trunk        Upper body assist Assist Level: Supervision or verbal cues      Lower Body Dressing/Undressing Lower body dressing   What is the patient wearing?: Pants, Underwear, Shoes, AFO, Socks Underwear - Performed by patient: Thread/unthread right underwear leg, Thread/unthread left underwear leg, Pull underwear up/down Underwear - Performed by helper: Pull underwear up/down Pants- Performed by patient: Thread/unthread right pants leg, Thread/unthread left pants leg, Fasten/unfasten pants Pants- Performed by helper: Pull pants up/down Non-skid slipper socks- Performed by patient: Don/doff right sock Non-skid slipper socks- Performed by helper: Don/doff left sock Socks - Performed by patient: Don/doff right sock, Don/doff left sock Socks -  Performed by helper: Don/doff left sock Shoes - Performed by patient: Fasten right, Don/doff right shoe Shoes - Performed by helper: Don/doff left shoe, Fasten left   AFO - Performed by helper: Don/doff left AFO TED Hose - Performed by patient: Don/doff right TED hose TED Hose - Performed by helper: Don/doff right TED hose, Don/doff left TED hose   Lower body assist Assist for lower body dressing: Touching or steadying assistance (Pt > 75%)      Toileting Toileting Toileting activity did not occur: No continent bowel/bladder event Toileting steps completed by patient: Performs perineal hygiene Toileting steps completed by helper: Adjust clothing prior to toileting, Adjust clothing after toileting Toileting Assistive Devices: Grab bar or rail  Toileting assist Assist level: Touching or steadying assistance (Pt.75%)   Transfers Chair/bed transfer   Chair/bed transfer method: Stand pivot Chair/bed transfer assist level: Touching or steadying assistance (Pt > 75%) Chair/bed transfer assistive device: Armrests     Locomotion Ambulation     Max distance: 24ft Assist level: Maximal assist (Pt 25 - 49%)   Wheelchair   Type: Manual Max wheelchair distance: 151ft Assist Level: Supervision or verbal cues  Cognition Comprehension Comprehension assist level: Follows complex conversation/direction with extra time/assistive device  Expression Expression assist level: Expresses complex 90% of the time/cues < 10% of the time  Social Interaction Social Interaction assist level: Interacts appropriately 90% of the time - Needs monitoring or encouragement for participation or interaction.  Problem Solving Problem solving assist level: Solves complex 90% of the time/cues < 10% of the time  Memory Memory assist level: Recognizes or recalls 90% of the time/requires cueing < 10% of the time    Medical Problem List and Plan: 1.  Decreased functional mobility secondary to right frontal hemorrhage, small SAH and signs of PRES  Cont CIR PT OT, SLP- PRAFO/WHO ,  Using AFO in therapy will order  Fluoxetine started 7/3, increased to 20 on 7/10  2.  DVT Prophylaxis/Anticoagulation: SCDs.   Vascular study neg 3. Pain Management: Tylenol as needed 4. Mood: Provide emotional support 5. Neuropsych: This patient is  fully capable of making decisions on  her own behalf.per Neuropsych Eval 6. Skin/Wound Care: Routine skin checks 7. Fluids/Electrolytes/Nutrition: Routine I&Os 8. Seizure prophylaxis. Keppra 500 mg every 12 hours 9. ESRD with history of right renal cancer status post nephrectomy. Continue peritoneal dialysis as per renal services 10. Hypertension. At home Norvasc 5 mg twice daily, Coreg 12.5 mg twice a day, Cozaar 50 mg daily, , amlodipine increased from 5- > 10mg  per Nephro on 7/11, Nephro also adjusting dialysate BP improved 7/18   Vitals:   01/20/17 1447 01/21/17 0456  BP: 104/72 132/86  Pulse: 77 69  Resp: 18 18  Temp: 98 F (36.7 C) 98.7 F (37.1 C)   11. Hypothyroidism. Synthroid 12.Constipation. Laxative assistance, last recorded BM, senna S 1 BID- BM 7/18 type 6    LOS (Days) 17 A FACE TO FACE EVALUATION WAS PERFORMED  KIRSTEINS,ANDREW E 01/21/2017 5:56 AM

## 2017-01-22 ENCOUNTER — Inpatient Hospital Stay (HOSPITAL_COMMUNITY): Payer: BLUE CROSS/BLUE SHIELD | Admitting: Physical Therapy

## 2017-01-22 ENCOUNTER — Inpatient Hospital Stay (HOSPITAL_COMMUNITY): Payer: BLUE CROSS/BLUE SHIELD | Admitting: Occupational Therapy

## 2017-01-22 NOTE — Plan of Care (Signed)
Problem: RH Bed Mobility Goal: LTG Patient will perform bed mobility with assist (PT) LTG: Patient will perform bed mobility with assistance, with/without cues (PT).  Upgraded due to pt progress.  Problem: RH Bed to Chair Transfers Goal: LTG Patient will perform bed/chair transfers w/assist (PT) LTG: Patient will perform bed/chair transfers with assistance, with/without cues (PT).  Upgraded due to pt progress.  Problem: RH Wheelchair Mobility Goal: LTG Patient will propel w/c in home environment (PT) LTG: Patient will propel wheelchair in home environment, # of feet with assistance (PT).  Upgraded due to pt progress.

## 2017-01-22 NOTE — Progress Notes (Signed)
[facility] PHYSICAL MEDICINE & REHABILITATION     PROGRESS NOTE  Subjective/Complaints:  Pt feeling more upbeat, discussed BP, bowels  ROS: Denies CP, SOB, N/V/D.  Objective: Vital Signs: Blood pressure 116/83, pulse 70, temperature 98.2 F (36.8 C), temperature source Oral, resp. rate 18, height 5\' 5"  (1.651 m), weight 47.9 kg (105 lb 9.6 oz), SpO2 98 %. No results found. No results for input(s): WBC, HGB, HCT, PLT in the last 72 hours. No results for input(s): NA, K, CL, GLUCOSE, BUN, CREATININE, CALCIUM in the last 72 hours.  Invalid input(s): CO CBG (last 3)  No results for input(s): GLUCAP in the last 72 hours.  Wt Readings from Last 3 Encounters:  01/21/17 47.9 kg (105 lb 9.6 oz)  01/04/17 50.7 kg (111 lb 12.4 oz)  09/04/16 54.4 kg (120 lb)    Physical Exam:  BP 116/83 (BP Location: Left Wrist)   Pulse 70   Temp 98.2 F (36.8 C) (Oral)   Resp 18   Ht 5\' 5"  (1.651 m)   Wt 47.9 kg (105 lb 9.6 oz)   SpO2 98%   BMI 17.57 kg/m  Constitutional: She appears well-developed. Frail  HENT: Normocephalic and atraumatic.  Eyes: EOMI. No discharge.  Cardiovascular: RRR. No JVD. Respiratory: CTA Bilaterally. Normal effort .  GI: Soft. Bowel sounds are normal.   Musculoskeletal: She exhibits no edema or tenderness.  Neurological: She is alert.  Fair awareness of deficits.  Motor: RUE/RLE: 5/5 LUE 0/5 except 2- triceps /LLE: 3-/5 left Hip/knee ext synergy, L foot drop No increase in tone Tone: Ashworth 1 in arm adductors o/w 0 in UE Skin: Skin is warm and dry.  Psychiatric: Her behavior is normal. Her affect is blunt. She does not engage much   Assessment/Plan: 1. Functional deficits secondary to right frontal hemorrhage, small SAH and signs of PRES which require 3+ hours per day of interdisciplinary therapy in a comprehensive inpatient rehab setting. Physiatrist is providing close team supervision and 24 hour management of active medical problems listed  below. Physiatrist and rehab team continue to assess barriers to discharge/monitor patient progress toward functional and medical goals.  Function:  Bathing Bathing position   Position: Shower  Bathing parts Body parts bathed by patient: Left arm, Chest, Abdomen, Front perineal area, Right upper leg, Left upper leg, Buttocks, Right lower leg, Right arm, Left lower leg Body parts bathed by helper: Back  Bathing assist Assist Level: Touching or steadying assistance(Pt > 75%)      Upper Body Dressing/Undressing Upper body dressing   What is the patient wearing?: Pull over shirt/dress     Pull over shirt/dress - Perfomed by patient: Thread/unthread right sleeve, Thread/unthread left sleeve, Put head through opening, Pull shirt over trunk Pull over shirt/dress - Perfomed by helper: Pull shirt over trunk        Upper body assist Assist Level: Supervision or verbal cues      Lower Body Dressing/Undressing Lower body dressing   What is the patient wearing?: Pants, Underwear, Shoes, AFO, Socks Underwear - Performed by patient: Thread/unthread right underwear leg, Thread/unthread left underwear leg, Pull underwear up/down Underwear - Performed by helper: Pull underwear up/down Pants- Performed by patient: Thread/unthread right pants leg, Thread/unthread left pants leg, Fasten/unfasten pants Pants- Performed by helper: Pull pants up/down Non-skid slipper socks- Performed by patient: Don/doff right sock Non-skid slipper socks- Performed by helper: Don/doff left sock Socks - Performed by patient: Don/doff right sock, Don/doff left sock Socks - Performed by helper: Don/doff  left sock Shoes - Performed by patient: Fasten right, Don/doff right shoe Shoes - Performed by helper: Don/doff left shoe, Fasten left   AFO - Performed by helper: Don/doff left AFO TED Hose - Performed by patient: Don/doff right TED hose TED Hose - Performed by helper: Don/doff right TED hose, Don/doff left TED hose   Lower body assist Assist for lower body dressing: Touching or steadying assistance (Pt > 75%)      Toileting Toileting Toileting activity did not occur: No continent bowel/bladder event Toileting steps completed by patient: Performs perineal hygiene Toileting steps completed by helper: Adjust clothing prior to toileting, Adjust clothing after toileting Toileting Assistive Devices: Grab bar or rail  Toileting assist Assist level: Touching or steadying assistance (Pt.75%)   Transfers Chair/bed transfer   Chair/bed transfer method: Squat pivot Chair/bed transfer assist level: Touching or steadying assistance (Pt > 75%) Chair/bed transfer assistive device: Armrests     Locomotion Ambulation     Max distance: 30ft Assist level: Moderate assist (Pt 50 - 74%)   Wheelchair   Type: Manual Max wheelchair distance: 184ft Assist Level: No help, No cues, assistive device, takes more than reasonable amount of time  Cognition Comprehension Comprehension assist level: Follows complex conversation/direction with extra time/assistive device  Expression Expression assist level: Expresses complex 90% of the time/cues < 10% of the time  Social Interaction Social Interaction assist level: Interacts appropriately 90% of the time - Needs monitoring or encouragement for participation or interaction.  Problem Solving Problem solving assist level: Solves complex 90% of the time/cues < 10% of the time  Memory Memory assist level: Recognizes or recalls 90% of the time/requires cueing < 10% of the time    Medical Problem List and Plan: 1.  Decreased functional mobility secondary to right frontal hemorrhage, small SAH and signs of PRES  Cont CIR PT OT, SLP- PRAFO/WHO ,  Awaiting orthotic eval for AFO  Fluoxetine started 7/3, increased to 20 on 7/10  2.  DVT Prophylaxis/Anticoagulation: SCDs.   Vascular study neg 3. Pain Management: Tylenol as needed 4. Mood: Provide emotional support, pt feeling more  optimistic about recovery 5. Neuropsych: This patient is  fully capable of making decisions on her own behalf.per Neuropsych Eval 6. Skin/Wound Care: Routine skin checks 7. Fluids/Electrolytes/Nutrition: Routine I&Os Intake 10-95% meals 8. Seizure prophylaxis. Keppra 500 mg every 12 hours 9. ESRD with history of right renal cancer status post nephrectomy. Continue peritoneal dialysis as per renal services 10. Hypertension. At home Norvasc 5 mg twice daily, Coreg 12.5 mg twice a day, Cozaar 50 mg daily, , amlodipine increased from 5- > 10mg  per Nephro on 7/11, Nephro also adjusting dialysate BP improved 7/19   Vitals:   01/21/17 1828 01/22/17 0450  BP: 108/72 116/83  Pulse: 71 70  Resp: 20 18  Temp: 99 F (37.2 C) 98.2 F (36.8 C)   11. Hypothyroidism. Synthroid 12.Constipation. Laxative assistance, last recorded BM, senna S 1 BID- BM 7/19 type 6    LOS (Days) 18 A FACE TO FACE EVALUATION WAS PERFORMED  Charlett Blake 01/22/2017 6:46 AM

## 2017-01-22 NOTE — Progress Notes (Signed)
Occupational Therapy Session Note  Patient Details  Name: Terry Juarez MRN: [id number] Date of Birth: 03/16/1966  Today's Date: 01/22/2017  Session 1 OT Individual Time: 1116-1201 OT Individual Time Calculation (min): 45 min  Session 2 OT Individual Time: 1300-1345 OT Individual Time Calculation (min): 45 min    Short Term Goals: Week 3:  OT Short Term Goal 1 (Week 3): Pt will complete L UE self-ROM without assistance OT Short Term Goal 2 (Week 3): Pt will maintain standing balance with min A while pulling pants over hips during BADLs OT Short Term Goal 3 (Week 3): Pt will independently place LUE into functional position during self-care tasks to improve awareness of LUE/minimize UE injury  Skilled Therapeutic Interventions/Progress Updates:  Session 1   OT treatment session focused on pt/family education, transfer training, and iADL modifications. Discussed home bathroom and kitchen set-up. Simulated walk-in shower transfer and educated on DME options. Pt completed walk-in shower transfer using wide shower chair with min A L and R. Pt then practiced kitchen access at wc level. Able to access refrigerator, lower cabinets, and counter tops. Practiced sit<>stands at the sink using cabinets for knee block- overall min guard A for balance when reaching for objects. Pt returned to room at end of session and left with needs met.   Session 2 OT treatment session focused on L NMR, NMES, and functional use of L UE. Pt completed 15 mins of NMES focused on wrist flexion/extension, then active pronation alternated in between sets. Pt then brought into gravity eliminated sidelying position for L NMR focused on scap protraction/retraction, elbow/shoulder flex/ext. Pt returned to room and end of session and left with needs met.   Therapy Documentation Precautions:  Precautions Precautions: Fall Precaution Comments: Peritoneal dialysis patient; L subluxed shoulder Restrictions Weight Bearing  Restrictions: No Pain: Pain Assessment Pain Assessment: No/denies painnone/denies pain ADL: ADL ADL Comments: refer to functional navigator  See Function Navigator for Current Functional Status.  Therapy/Group: Individual Therapy  Elisabeth S Doe 01/22/2017, 1:48 PM  

## 2017-01-22 NOTE — Progress Notes (Signed)
Physical Therapy Weekly Progress Note  Patient Details  Name: Terry Juarez MRN: [id number] Date of Birth: 02-12-1966  Beginning of progress report period: January 14, 2017 End of progress report period: January 22, 2017  Today's Date: 01/22/2017 PT Individual Time: 1000-1115 PT Individual Time Calculation (min): 75 min   Patient has met 4 of 4 short term goals.  Pt is continuing to progress and requires less assistance for all mobility (bed, transfers and gait). Pt is making progress with motor planning of L LE extension for standing and stance phase of gait. Pt is continuing to make progress with dynamic sitting balance for functional tasks.   Patient continues to demonstrate the following deficits muscle weakness, decreased cardiorespiratoy endurance, impaired timing and sequencing, abnormal tone, unbalanced muscle activation, motor apraxia, decreased coordination and decreased motor planning and decreased sitting balance, decreased standing balance, decreased postural control, hemiplegia and decreased balance strategies and therefore will continue to benefit from skilled PT intervention to increase functional independence with mobility.  Patient progressing toward long term goals..  Plan of care revisions: Updated long term goals for bed mobility, transfers and w/c mobility to mod-I..  PT Short Term Goals Week 2:  PT Short Term Goal 1 (Week 2): Pt will ambulate with LRAD x50' with max assist PT Short Term Goal 1 - Progress (Week 2): Met PT Short Term Goal 2 (Week 2): Pt will transfer with consistent min assist PT Short Term Goal 2 - Progress (Week 2): Met PT Short Term Goal 3 (Week 2): Pt will initiate stair training with PT PT Short Term Goal 3 - Progress (Week 2): Met PT Short Term Goal 4 (Week 2): Pt will demo static standing balance with RUE support with min assist  PT Short Term Goal 4 - Progress (Week 2): Met Week 3:  PT Short Term Goal 1 (Week 3): Pt will transfer consistently with  supervision PT Short Term Goal 2 (Week 3): Pt will ambulated with LRAD 26f with min assist PT Short Term Goal 3 (Week 3): Pt will ascend/descend 4steps with mod assist  PT Short Term Goal 4 (Week 3): Pt will demo static standing balance with R UE support with supervision PT Short Term Goal 5 (Week 3): Pt will perform car transfer with mod assist  Skilled Therapeutic Interventions/Progress Updates:    no c/o pain. PT treatment session focused on L LE NMR during functional activities for bed mobility, standing and gait.  Pt sitting in w/c on arrival, agreeable to PT treatment session. Pt had ground reaction force AFO on L LE entire session. Pt propelled w/c [id number] room to gym mod-I. Pt performed squat pivot transfers throughout session with close supervision for safety and verbal cues for L LE placement. Pt performed sit to supine on mat with increased time with supervision for safety and pt able to use R UE to lift L LE onto mat. Pt performed sidelying alternating L LE flexion/extension on maxislide for decreased friction for motor planning and coordination. Pt attempted supine heel slides with inability to lift L LE. Pt performed 2 sets 10 bridges 5 second hold with a ball for hip adduction during second set and PT providing verbal and tactile cues for hip extension. Pt supine to sit with min assist for L LE placement. Pt sit to stand throughout session with min assist for steadying. Pt performed 2 trials of minisquats to fatigue with hemiwalker for balance and PT providing tactile and verbal cues for L LE extension. Pt performed  2 trials to fatigue of L LE terminal knee extension in standing with R LE placed up on stair for increased weight bearing into L LE. Pt used hemiwalker on 1st trial for balance and HHA on 2nd trial. PT provided min tactile cues for L LE weight shifting and hip and knee extension progressing to only cues for L weight shift with pt able to perform L LE extension with only verbal  cues. Pt ambulated 7f, 93fwith rest break between using hemiwalker and mod assist for L LE swing and placement during gait and no assist needed for trunk control. Family education on pt current level of function and pt demonstrating close supervision level of assist for transfers bed<>w/c. Pt left sitting in w/c in room with family present.   Therapy Documentation Precautions:  Precautions Precautions: Fall Precaution Comments: Peritoneal dialysis patient; L subluxed shoulder Restrictions Weight Bearing Restrictions: No   See Function Navigator for Current Functional Status.  Therapy/Group: Individual Therapy  Crew Goren 01/22/2017, 12:02 PM

## 2017-01-22 NOTE — Progress Notes (Signed)
[facility] Progress Note   Subjective:  No complaints.  Feeling well.  Family with Terry Juarez.      Objective Vitals:   01/21/17 1242 01/21/17 1828 01/22/17 0450 01/22/17 0800  BP: 133/88 108/72 116/83 116/72  Pulse: 75 71 70 71  Resp: 18 20 18 18   Temp: 98.2 F (36.8 C) 99 F (37.2 C) 98.2 F (36.8 C) 98.5 F (36.9 C)  TempSrc: Oral Oral Oral Oral  SpO2: 99% 96% 98% 96%  Weight:  47.9 kg (105 lb 9.6 oz)  47.1 kg (103 lb 13.4 oz)  Height:       Physical Exam General: slender NAD HEENT: MMM NECK: no JVD CV Regular S1S2 No S3 PULM Lungs clear ABD: soft NT EXTNo LE edema NEURO Left hemiparesis. ACCESS: PD cath with clear dry dressing R abdomen; Right upper arm AVF aneurysmal/+ bruit   Dialysis:PD 5 exchanges overnight, no day bag, no pause, 2000 volume, using all 1.5%- UF of 1.5 liters   Summary: Pt is a 51 y.o.yo femalewith ESRD on PD who was admitted on 6/16/2018with sz/hemiparesis- found to have right sided frontal lobe hemorrhage, alsowith PRES. Seizures controlled in [place]. Had cardiac arrest 12/25/16 in Inpt HD. Transferred to Rehab 01/04/17.  Assessment/ Plan: 1. Intracranial CVA/ small SAH R frontal, + PRES with L hemiparesis - pt was recovering then had cardiac arrest at inpt dialysis on 12/25/16; transitioned back to PD- making progress again. 2. ESRD- Continue [facility].  Family in [place] are planning to take Terry Juarez after dc for 6 mos. Brother-in-law to do PD training in [place]. Use all 1.5%.  Will touch base with HT to make sure no changes to plan. 3. HTN/volume - stable 4. Cardiac arrest - while on HD 6/22. Refuses to ever consider HD again.  5. Anemia- history of GIB and low hgb. Responded toowell to ESA and and Hb now >13. Holding ESA now 6. MBD - Had hypercalcemia Ca 10.9 on 7/2.   Stopped calcium acetate but then became hypocalcemic (7.2) so resumed Ca acetate and stopped sensipar.   7. Nutrition Alb 2.7 - regular diet ok, has Nepro  and vitamins  8. Dispo - Anticipated discharge 7/31    Recent Labs Lab 01/17/17 0436  NA 136  K 3.8  CL 97*  CO2 25  GLUCOSE 101*  BUN 57*  CREATININE 15.18*  CALCIUM 8.1*  PHOS 5.1*     Recent Labs Lab 01/17/17 0436  ALBUMIN 2.7*     Recent Labs Lab 01/17/17 0436  WBC 7.2  HGB 13.4  HCT 42.0  MCV 99.1  PLT 280    Lab Results  Component Value Date   INR 0.93 12/21/2016   INR 1.88 12/19/2016   INR 0.97 12/19/2016    Medications: . dialysis solution 1.5% low-MG/low-CA     . amLODipine  10 mg Oral Daily  . calcium acetate  667 mg Oral TID WC  . carvedilol  12.5 mg Oral BID WC  . famotidine  20 mg Oral Daily  . feeding supplement (NEPRO CARB STEADY)  237 mL Oral BID BM  . FLUoxetine  20 mg Oral Daily  . gentamicin cream  1 application Topical Daily  . levETIRAcetam  500 mg Oral BID  . levothyroxine  88 mcg Oral QAC breakfast  . multivitamin  1 tablet Oral Daily  . pantoprazole  40 mg Oral BID  . polyethylene glycol  17 g Oral Daily  . senna-docusate  2 tablet Oral BID

## 2017-01-22 NOTE — Progress Notes (Signed)
Occupational Therapy Session Note  Patient Details  Name: Terry Juarez MRN: [id number] Date of Birth: 1965/09/05  Today's Date: 01/22/2017 OT Individual Time: 3419-3790 OT Individual Time Calculation (min): 30 min    Short Term Goals: Week 3:  OT Short Term Goal 1 (Week 3): Pt will complete L UE self-ROM without assistance OT Short Term Goal 2 (Week 3): Pt will maintain standing balance with min A while pulling pants over hips during BADLs OT Short Term Goal 3 (Week 3): Pt will independently place LUE into functional position during self-care tasks to improve awareness of LUE/minimize UE injury  Skilled Therapeutic Interventions/Progress Updates:    Pt received supine in bed, agreeable to OT tx session. Focus of session on ADL retraining, sit<>stand transfers and standing balance during ADL completion. Pt completes supine to EOB with supervision, squat pivot transfer EOB>w/c with MinA. Pt completed bathing, dressing, and grooming ADLs seated in w/c at sink. HOH assist to wash RUE, assist to wash back and Mod steadying assist during standing while Pt washes perineal and buttocks area. Pt completes sit<>stand transfers at sink with MinA. Completes UB dressing with assist for threading LUE through bra strap and fastening bra, dons overhead shirt with supervision. Pt completes LB dressing with min steadying assist in standing while Pt pulls pants/underwear over hips. Dons socks and shoes with assist for donning L shoe/L AFO. Ended session with Pt seated in w/c, call bell and needs within reach.   Therapy Documentation Precautions:  Precautions Precautions: Fall Precaution Comments: Peritoneal dialysis patient; L subluxed shoulder Restrictions Weight Bearing Restrictions: No    Pain: Pain Assessment Pain Assessment: No/denies pain ADL: ADL ADL Comments: refer to functional navigator  See Function Navigator for Current Functional Status.   Therapy/Group: Individual Therapy  Raymondo Band 01/22/2017, 12:51 PM

## 2017-01-23 ENCOUNTER — Inpatient Hospital Stay (HOSPITAL_COMMUNITY): Payer: BLUE CROSS/BLUE SHIELD

## 2017-01-23 ENCOUNTER — Inpatient Hospital Stay (HOSPITAL_COMMUNITY): Payer: BLUE CROSS/BLUE SHIELD | Admitting: Occupational Therapy

## 2017-01-23 NOTE — Progress Notes (Signed)
Occupational Therapy Session Note  Patient Details  Name: Terry Juarez MRN: [id number] Date of Birth: Sep 14, 1965  Today's Date: 01/23/2017 OT Individual Time: 6580-0634 OT Individual Time Calculation (min): 30 min    Short Term Goals: Week 3:  OT Short Term Goal 1 (Week 3): Pt will complete L UE self-ROM without assistance OT Short Term Goal 2 (Week 3): Pt will maintain standing balance with min A while pulling pants over hips during BADLs OT Short Term Goal 3 (Week 3): Pt will independently place LUE into functional position during self-care tasks to improve awareness of LUE/minimize UE injury  Skilled Therapeutic Interventions/Progress Updates:    OT treatment session focused on modified dressing strategies. Pt complete stand-pivot transfer to L with min A and improved head/hips relationship and LLE control. Pt obtained clothing and propelled pt to sink with min A to maneuver tight space around bed. Pt completed UB dressing using hemi-techniques with min A to orient shirt after threading L UE. Pt able to position LLE into figure 4 position to thread underwear and pants. Min-Mod A for balance while reaching to pull up pants at the sink. Worked donned AFO on LLE with overall min A. Addressed standing balance with standing grooming task, overall min A with intermittent close supervision. Pt then completed self-ROM Shoulder flexion and forearm pronation. Pt left with L UE support on 1/2 lap tray and needs met.   Therapy Documentation Precautions:  Precautions Precautions: Fall Precaution Comments: Peritoneal dialysis patient; L subluxed shoulder Restrictions Weight Bearing Restrictions: No Pain: Pain Assessment Pain Assessment: No/denies pain ADL: ADL ADL Comments: refer to functional navigator :    See Function Navigator for Current Functional Status.   Therapy/Group: Individual Therapy  [name] 01/23/2017, 11:14 AM

## 2017-01-23 NOTE — Progress Notes (Signed)
[facility] tx initiated via tenckhoff w/o problem, VSS, reported off to primary nurse

## 2017-01-23 NOTE — Progress Notes (Signed)
[facility] Progress Note   Subjective:  No complaints.  Feels good.  Had a therapeutic grounds pass yesterday that was very invigorating for her.    Objective Vitals:   01/22/17 0450 01/22/17 0800 01/22/17 1542 01/23/17 0506  BP: 116/83 116/72 117/68 113/76  Pulse: 70 71 70 70  Resp: 18 18 17 16   Temp: 98.2 F (36.8 C) 98.5 F (36.9 C) 98.4 F (36.9 C) 98.3 F (36.8 C)  TempSrc: Oral Oral Oral Oral  SpO2: 98% 96% 97% 98%  Weight:  47.1 kg (103 lb 13.4 oz)  47.2 kg (104 lb 1.4 oz)  Height:       Physical Exam General: slender NAD HEENT: MMM NECK: no JVD CV Regular S1S2 No S3 PULM Lungs clear ABD: soft NT EXTNo LE edema NEURO Left hemiparesis, some improvement ACCESS: PD cath with clear dry dressing R abdomen; Right upper arm AVF aneurysmal/+ bruit   Dialysis:PD 5 exchanges overnight, no day bag, no pause, 2000 volume, using all 1.5%- UF of 1.5 liters   Summary: Pt is a 51 y.o.yo femalewith ESRD on PD who was admitted on 6/16/2018with sz/hemiparesis- found to have right sided frontal lobe hemorrhage, alsowith PRES. Seizures controlled in [place]. Had cardiac arrest 12/25/16 in Inpt HD. Transferred to Rehab 01/04/17.  Assessment/ Plan: 1. Intracranial CVA/ small SAH R frontal, + PRES with L hemiparesis - pt was recovering then had cardiac arrest at inpt dialysis on 12/25/16; transitioned back to PD- making excellent progress 2. ESRD- Continue CCPD.  Family in [place] are planning to take her after dc for 6 mos. Brother-in-law to do PD training in [place]. Use all 1.5%.  Will touch base with HT Monday to make sure no changes to plan. 3. HTN/volume - stable.  EDW appears to be around 47 kg 4. Cardiac arrest - while on HD 6/22. Refuses to ever consider HD again.  5. Anemia- history of GIB and low hgb. Responded toowell to ESA and and Hb now >13. Holding ESA now 6. MBD - Had hypercalcemia Ca 10.9 on 7/2.   Stopped calcium acetate but then became  hypocalcemic (7.2) so resumed Ca acetate and stopped sensipar.   7. Nutrition Alb 2.7 - regular diet ok, has Nepro and vitamins  8. Dispo - Anticipated discharge 7/31 to brother-in-law and sister-in-law's house   Recent Labs Lab 01/17/17 0436  NA 136  K 3.8  CL 97*  CO2 25  GLUCOSE 101*  BUN 57*  CREATININE 15.18*  CALCIUM 8.1*  PHOS 5.1*     Recent Labs Lab 01/17/17 0436  ALBUMIN 2.7*     Recent Labs Lab 01/17/17 0436  WBC 7.2  HGB 13.4  HCT 42.0  MCV 99.1  PLT 280    Lab Results  Component Value Date   INR 0.93 12/21/2016   INR 1.88 12/19/2016   INR 0.97 12/19/2016    Medications: . dialysis solution 1.5% low-MG/low-CA     . amLODipine  10 mg Oral Daily  . calcium acetate  667 mg Oral TID WC  . carvedilol  12.5 mg Oral BID WC  . famotidine  20 mg Oral Daily  . feeding supplement (NEPRO CARB STEADY)  237 mL Oral BID BM  . FLUoxetine  20 mg Oral Daily  . gentamicin cream  1 application Topical Daily  . levETIRAcetam  500 mg Oral BID  . levothyroxine  88 mcg Oral QAC breakfast  . multivitamin  1 tablet Oral Daily  . pantoprazole  40 mg  Oral BID  . polyethylene glycol  17 g Oral Daily  . senna-docusate  2 tablet Oral BID

## 2017-01-23 NOTE — Progress Notes (Signed)
Occupational Therapy Session Note  Patient Details  Name: Terry Juarez MRN: [id number] Date of Birth: September 04, 1965  Today's Date: 01/23/2017 OT Individual Time: 1345-1500 OT Individual Time Calculation (min): 75 min    Skilled Therapeutic Interventions/Progress Updates:    1:1. No pain reported. Focus of session on sit to stand, weightbearing through LUE, standing balance, and LUE AAROM. Pt propels w/c to from all tx destinations with supervision. Standing at kitchen counter  With L knee block, pt weight bears through LUE to reach laterally and cross midline to place dishes into cabinet above shoulder height or place in dishwasher with MIN A for standing balance and VC for weight shifting. Pt stands at counter in manner above to completes 1x15 towel glides forward/back, right/left, diagonal an circles. Pt completes chair to/from high regular bed transfer with min A and hemi walker. With VC, pt able to hook RLE under LLE to completes LE management with supervision. While supine/sidelying on mat pt competes 2x12 AAROM in gravity elimiated positions with MAX A to achieve full range of shoulder prona/supination flexion/extension, int/ext rotation, horizontal ab/adduction, ab/adduction, and elbow flex/ext. Exited session with pt seated in recliner with call light in reach and all needs met.   Therapy Documentation Precautions:  Precautions Precautions: Fall Precaution Comments: Peritoneal dialysis patient; L subluxed shoulder Restrictions Weight Bearing Restrictions: No  See Function Navigator for Current Functional Status.   Therapy/Group: Individual Therapy  [name] 01/23/2017, 3:42 PM

## 2017-01-24 LAB — CBC WITH DIFFERENTIAL/PLATELET
Basophils Absolute: 0 10*3/uL (ref 0.0–0.1)
Basophils Relative: 1 %
EOS PCT: 2 %
Eosinophils Absolute: 0.1 10*3/uL (ref 0.0–0.7)
HCT: 40.6 % (ref 36.0–46.0)
Hemoglobin: 13.1 g/dL (ref 12.0–15.0)
LYMPHS ABS: 1.4 10*3/uL (ref 0.7–4.0)
LYMPHS PCT: 20 %
MCH: 31.3 pg (ref 26.0–34.0)
MCHC: 32.3 g/dL (ref 30.0–36.0)
MCV: 96.9 fL (ref 78.0–100.0)
MONO ABS: 0.8 10*3/uL (ref 0.1–1.0)
Monocytes Relative: 12 %
Neutro Abs: 4.6 10*3/uL (ref 1.7–7.7)
Neutrophils Relative %: 65 %
PLATELETS: 295 10*3/uL (ref 150–400)
RBC: 4.19 MIL/uL (ref 3.87–5.11)
RDW: 14.5 % (ref 11.5–15.5)
WBC: 6.9 10*3/uL (ref 4.0–10.5)

## 2017-01-24 LAB — RENAL FUNCTION PANEL
Albumin: 3 g/dL — ABNORMAL LOW (ref 3.5–5.0)
Anion gap: 17 — ABNORMAL HIGH (ref 5–15)
BUN: 56 mg/dL — AB (ref 6–20)
CHLORIDE: 97 mmol/L — AB (ref 101–111)
CO2: 23 mmol/L (ref 22–32)
CREATININE: 15.5 mg/dL — AB (ref 0.44–1.00)
Calcium: 9.1 mg/dL (ref 8.9–10.3)
GFR calc Af Amer: 3 mL/min — ABNORMAL LOW (ref >60)
GFR calc non Af Amer: 2 mL/min — ABNORMAL LOW (ref >60)
Glucose, Bld: 100 mg/dL — ABNORMAL HIGH (ref 65–99)
Phosphorus: 6 mg/dL — ABNORMAL HIGH (ref 2.5–4.6)
Potassium: 4.1 mmol/L (ref 3.5–5.1)
Sodium: 137 mmol/L (ref 135–145)

## 2017-01-24 MED ORDER — CALCIUM ACETATE (PHOS BINDER) 667 MG PO CAPS
1334.0000 mg | ORAL_CAPSULE | Freq: Three times a day (TID) | ORAL | Status: DC
  Administered 2017-01-24 – 2017-02-01 (×24): 1334 mg via ORAL
  Filled 2017-01-24 (×21): qty 2

## 2017-01-24 NOTE — Progress Notes (Signed)
[facility]     PROGRESS NOTE  Subjective/Complaints:  No issues overnite  ROS: Denies CP, SOB, N/V/D.  Objective: Vital Signs: Blood pressure 117/77, pulse 66, temperature 97.9 F (36.6 C), temperature source Oral, resp. rate 18, height 5\' 5"  (1.651 m), weight 47.9 kg (105 lb 10.1 oz), SpO2 98 %. No results found.  Recent Labs  01/24/17 0630  WBC 6.9  HGB 13.1  HCT 40.6  PLT 295   No results for input(s): NA, K, CL, GLUCOSE, BUN, CREATININE, CALCIUM in the last 72 hours.  Invalid input(s): CO CBG (last 3)  No results for input(s): GLUCAP in the last 72 hours.  Wt Readings from Last 3 Encounters:  01/24/17 47.9 kg (105 lb 10.1 oz)  01/04/17 50.7 kg (111 lb 12.4 oz)  09/04/16 54.4 kg (120 lb)    Physical Exam:  BP 117/77 (BP Location: Left Arm)   Pulse 66   Temp 97.9 F (36.6 C) (Oral)   Resp 18   Ht 5\' 5"  (1.651 m)   Wt 47.9 kg (105 lb 10.1 oz)   SpO2 98%   BMI 17.58 kg/m  Constitutional: She appears well-developed. Frail  HENT: Normocephalic and atraumatic.  Eyes: EOMI. No discharge.  Cardiovascular: RRR. No JVD. Respiratory: CTA Bilaterally. Normal effort .  GI: Soft. Bowel sounds are normal.   Musculoskeletal: She exhibits no edema or tenderness.  Neurological: She is alert.  Fair awareness of deficits.  Motor: RUE/RLE: 5/5 LUE 0/5 except 2- triceps /LLE: 3-/5 left Hip/knee ext synergy, L foot drop No increase in tone Tone: [place] 1 in arm adductors o/w 0 in UE Skin: Skin is warm and dry.  Psychiatric: Her behavior is normal. Her affect is blunt. She does not engage much   Assessment/Plan: 1. Functional deficits secondary to right frontal hemorrhage, small SAH and signs of PRES which require 3+ hours per day of interdisciplinary therapy in a comprehensive inpatient rehab setting. Physiatrist is providing close team supervision and 24 hour management of active medical problems listed below. Physiatrist and rehab  team continue to assess barriers to discharge/monitor patient progress toward functional and medical goals.  Function:  Bathing Bathing position   Position: Wheelchair/chair at sink  Bathing parts Body parts bathed by patient: Left arm, Chest, Abdomen, Front perineal area, Right upper leg, Left upper leg, Buttocks, Right lower leg, Left lower leg Body parts bathed by helper: Back, Right arm  Bathing assist Assist Level: Touching or steadying assistance(Pt > 75%)      Upper Body Dressing/Undressing Upper body dressing   What is the patient wearing?: Pull over shirt/dress, Bra Bra - Perfomed by patient: Thread/unthread right bra strap Bra - Perfomed by helper: Thread/unthread left bra strap, Hook/unhook bra (pull down sports bra) Pull over shirt/dress - Perfomed by patient: Thread/unthread right sleeve, Thread/unthread left sleeve, Put head through opening, Pull shirt over trunk Pull over shirt/dress - Perfomed by helper: Pull shirt over trunk        Upper body assist Assist Level: Supervision or verbal cues      Lower Body Dressing/Undressing Lower body dressing   What is the patient wearing?: Pants, Underwear, Shoes, AFO, Socks Underwear - Performed by patient: Thread/unthread right underwear leg, Thread/unthread left underwear leg, Pull underwear up/down Underwear - Performed by helper: Pull underwear up/down Pants- Performed by patient: Thread/unthread right pants leg, Thread/unthread left pants leg, Fasten/unfasten pants, Pull pants up/down Pants- Performed by helper: Pull pants up/down Non-skid slipper socks- Performed by patient:  Don/doff right sock Non-skid slipper socks- Performed by helper: Don/doff left sock Socks - Performed by patient: Don/doff right sock, Don/doff left sock Socks - Performed by helper: Don/doff left sock Shoes - Performed by patient: Fasten right, Don/doff right shoe Shoes - Performed by helper: Don/doff left shoe, Fasten left   AFO - Performed by  helper: Don/doff left AFO TED Hose - Performed by patient: Don/doff right TED hose TED Hose - Performed by helper: Don/doff right TED hose, Don/doff left TED hose  Lower body assist Assist for lower body dressing: Touching or steadying assistance (Pt > 75%)      Toileting Toileting Toileting activity did not occur: No continent bowel/bladder event Toileting steps completed by patient: Performs perineal hygiene Toileting steps completed by helper: Adjust clothing prior to toileting, Adjust clothing after toileting Toileting Assistive Devices: Grab bar or rail  Toileting assist Assist level: Touching or steadying assistance (Pt.75%)   Transfers Chair/bed transfer   Chair/bed transfer method: Squat pivot Chair/bed transfer assist level: Supervision or verbal cues Chair/bed transfer assistive device: Armrests     Locomotion Ambulation     Max distance: 37ft Assist level: Moderate assist (Pt 50 - 74%)   Wheelchair   Type: Manual Max wheelchair distance: 153ft Assist Level: No help, No cues, assistive device, takes more than reasonable amount of time  Cognition Comprehension Comprehension assist level: Follows complex conversation/direction with extra time/assistive device  Expression Expression assist level: Expresses complex 90% of the time/cues < 10% of the time  Social Interaction Social Interaction assist level: Interacts appropriately with others - No medications needed.  Problem Solving Problem solving assist level: Solves complex 90% of the time/cues < 10% of the time  Memory Memory assist level: Recognizes or recalls 90% of the time/requires cueing < 10% of the time    Medical Problem List and Plan: 1.  Decreased functional mobility secondary to right frontal hemorrhage, small SAH and signs of PRES  Cont CIR PT OT, SLP- PRAFO/WHO ,  Awaiting orthotic eval for AFO  Fluoxetine started 7/3, increased to 20 on 7/10  2.  DVT Prophylaxis/Anticoagulation: SCDs.   Vascular  study neg 3. Pain Management: Tylenol as needed 4. Mood: Provide emotional support, pt feeling more optimistic about recovery 5. Neuropsych: This patient is  fully capable of making decisions on her own behalf.per Neuropsych Eval 6. Skin/Wound Care: Routine skin checks 7. Fluids/Electrolytes/Nutrition: Routine I&Os Intake 50% dinner 7/21 8. Seizure prophylaxis. Keppra 500 mg every 12 hours 9. ESRD with history of right renal cancer status post nephrectomy. Continue peritoneal dialysis as per renal services 10. Hypertension. At home Norvasc 5 mg twice daily, Coreg 12.5 mg twice a day, Cozaar 50 mg daily, , amlodipine increased from 5- > 10mg  per Nephro on 7/11, Nephro also adjusting dialysate BP controlled 7/21   Vitals:   01/23/17 1831 01/24/17 0519  BP: (!) 106/58 117/77  Pulse: 72 66  Resp: 20 18  Temp: 98.1 F (36.7 C) 97.9 F (36.6 C)   11. Hypothyroidism. Synthroid 12.Constipation. Laxative assistance, last recorded BM, senna S 1 BID- BM 7/21 type 6    LOS (Days) 20 A FACE TO FACE EVALUATION WAS PERFORMED  Varsha Knock E 01/24/2017 7:03 AM

## 2017-01-24 NOTE — Progress Notes (Signed)
[facility] Progress Note   Subjective:  No complaints.  Heading into the bathroom.      Objective Vitals:   01/23/17 0506 01/23/17 0830 01/23/17 1831 01/24/17 0519  BP: 113/76  (!) 106/58 117/77  Pulse: 70  72 66  Resp: 16  20 18   Temp: 98.3 F (36.8 C)  98.1 F (36.7 C) 97.9 F (36.6 C)  TempSrc: Oral  Oral Oral  SpO2: 98%  96% 98%  Weight: 47.2 kg (104 lb 1.4 oz) 47.1 kg (103 lb 13.4 oz) 47.9 kg (105 lb 9.6 oz) 47.9 kg (105 lb 10.1 oz)  Height:       Physical Exam General: slender NAD HEENT: MMM NECK: no JVD CV Regular S1S2 No S3 PULM Lungs clear ABD: soft NT EXTNo LE edema NEURO Left hemiparesis, some improvement ACCESS: PD cath with clear dry dressing R abdomen; Right upper arm AVF aneurysmal/+ bruit   Dialysis:PD 5 exchanges overnight, no day bag, no pause, 2000 volume, using all 1.5%- UF of 1.5 liters   Summary: Pt is a 51 y.o.yo femalewith ESRD on PD who was admitted on 6/16/2018with seizures and L hemiparesis. Found to have right sided frontal lobe hemorrhage and PRES.  Had been transitioned from PD--> HD but had cardiac arrest 12/25/16 in inpatient HD.  Back on PD now.  Transferred to Rehab 01/04/17.  Assessment/ Plan: 1. CVA with R frontal SAH and PRES: Pt has L hemiparesis.  In CIR. Making excellent progress 2. Seizures: controlled on Keppra. 3. ESRD: Continue CCPD.  Family in [place] are planning to take her after dc for 6 mos. Brother-in-law to do PD training in [place]. Use all 1.5%.  Need to touch base with HT Monday to make sure no changes to plan. 4. HTN/volume: stable.  EDW appears to be around 47 kg.  On Coreg and amlodipine. 5. Cardiac arrest: had been transitioned to HD in setting of L hemiparesis; cardiac arrest occurred while on HD 6/22. Refuses to ever consider HD again.  6. Anemia: history of GIB.  Responded toowell to ESA and and Hb now >13. Holding ESA 7. MBD: No sensipar, on Phoslo, Phos 6.0 so will increase to 2  caps TID and follow--> Ca inching upwards, may need to choose non-calcium based binder if has hypercalcemia again. 8. Nutrition: Alb 3.0, slowly improving; regular diet, Nepro, vits  9. Dispo: Anticipated discharge 7/31 to brother-in-law and sister-in-law's house   Recent Labs Lab 01/24/17 0630  NA 137  K 4.1  CL 97*  CO2 23  GLUCOSE 100*  BUN 56*  CREATININE 15.50*  CALCIUM 9.1  PHOS 6.0*     Recent Labs Lab 01/24/17 0630  ALBUMIN 3.0*     Recent Labs Lab 01/24/17 0630  WBC 6.9  NEUTROABS 4.6  HGB 13.1  HCT 40.6  MCV 96.9  PLT 295    Lab Results  Component Value Date   INR 0.93 12/21/2016   INR 1.88 12/19/2016   INR 0.97 12/19/2016    Medications: . dialysis solution 1.5% low-MG/low-CA     . amLODipine  10 mg Oral Daily  . calcium acetate  667 mg Oral TID WC  . carvedilol  12.5 mg Oral BID WC  . famotidine  20 mg Oral Daily  . feeding supplement (NEPRO CARB STEADY)  237 mL Oral BID BM  . FLUoxetine  20 mg Oral Daily  . gentamicin cream  1 application Topical Daily  . levETIRAcetam  500 mg Oral BID  . levothyroxine  88 mcg Oral QAC breakfast  . multivitamin  1 tablet Oral Daily  . pantoprazole  40 mg Oral BID  . polyethylene glycol  17 g Oral Daily  . senna-docusate  2 tablet Oral BID

## 2017-01-25 ENCOUNTER — Inpatient Hospital Stay (HOSPITAL_COMMUNITY): Payer: BLUE CROSS/BLUE SHIELD | Admitting: Physical Therapy

## 2017-01-25 ENCOUNTER — Inpatient Hospital Stay (HOSPITAL_COMMUNITY): Payer: BLUE CROSS/BLUE SHIELD | Admitting: Occupational Therapy

## 2017-01-25 NOTE — Progress Notes (Signed)
Occupational Therapy Session Note  Patient Details  Name: Terry Juarez MRN: [id number] Date of Birth: [birth year]  Today's Date: 01/25/2017 OT Individual Time: 1000-1100 OT Individual Time Calculation (min): 60 min    Short Term Goals: Week 3:  OT Short Term Goal 1 (Week 3): Pt will complete L UE self-ROM without assistance OT Short Term Goal 2 (Week 3): Pt will maintain standing balance with min A while pulling pants over hips during BADLs OT Short Term Goal 3 (Week 3): Pt will independently place LUE into functional position during self-care tasks to improve awareness of LUE/minimize UE injury  Skilled Therapeutic Interventions/Progress Updates:    Treatment session focused on self care/ADL training, transfer training, AE/AD training, balance training, and safety awareness.   Upon entering, pt supine in bed and agreeable to AM ADLs. Pt transferred from lying to EOB and to w/c with close guard assist. Therapist instructed pt on functional mobility with hemi walker into shower for safety awareness and required min-mod A with advancing L LE. Pt transferred to shower bench with grab bars and min A. Pt completed bathing with min A in sit<>stand levels. Noted L LE weakness and required assistance for positioning for safe transfers. Pt completed UB/LB dressing with set up assistance but required mod A for footwear. She completed grooming at sink side in sitting level with supervision. Pt and therapist discuss home environment for safe dc home. Pt left in care of PT with no c/o pain.   Therapy Documentation Precautions:  Precautions Precautions: Fall Precaution Comments: Peritoneal dialysis patient; L subluxed shoulder Restrictions Weight Bearing Restrictions: No    Pain: Pain Assessment Pain Assessment: No/denies pain ADL: ADL ADL Comments: refer to functional navigator    See Function Navigator for Current Functional Status.   Therapy/Group: Individual Therapy  Delon [place] 01/25/2017, 12:36 PM

## 2017-01-25 NOTE — Progress Notes (Signed)
[facility] PHYSICAL MEDICINE & REHABILITATION     PROGRESS NOTE  Subjective/Complaints:  No issues overnite  ROS: Denies CP, SOB, N/V/D.  Objective: Vital Signs: Blood pressure 118/80, pulse 69, temperature 98.4 F (36.9 C), temperature source Oral, resp. rate 16, height 5\' 5"  (1.651 m), weight 47.9 kg (105 lb 11.1 oz), SpO2 98 %. No results found.  Recent Labs  01/24/17 0630  WBC 6.9  HGB 13.1  HCT 40.6  PLT 295    Recent Labs  01/24/17 0630  NA 137  K 4.1  CL 97*  GLUCOSE 100*  BUN 56*  CREATININE 15.50*  CALCIUM 9.1   CBG (last 3)  No results for input(s): GLUCAP in the last 72 hours.  Wt Readings from Last 3 Encounters:  01/25/17 47.9 kg (105 lb 11.1 oz)  01/04/17 50.7 kg (111 lb 12.4 oz)  09/04/16 54.4 kg (120 lb)    Physical Exam:  BP 118/80 (BP Location: Left Arm)   Pulse 69   Temp 98.4 F (36.9 C) (Oral)   Resp 16   Ht 5\' 5"  (1.651 m)   Wt 47.9 kg (105 lb 11.1 oz)   SpO2 98%   BMI 17.59 kg/m  Constitutional: She appears well-developed. Frail  HENT: Normocephalic and atraumatic.  Eyes: EOMI. No discharge.  Cardiovascular: RRR. No JVD. Respiratory: CTA Bilaterally. Normal effort .  GI: Soft. Bowel sounds are normal.   Musculoskeletal: She exhibits no edema or tenderness.  Neurological: She is alert.  Fair awareness of deficits.  Motor: RUE/RLE: 5/5 LUE 0/5 except 2- triceps /LLE: 3-/5 left Hip/knee ext synergy, L foot drop No increase in tone Tone: [place] 1 in arm adductors o/w 0 in UE Skin: Skin is warm and dry.  Psychiatric: Her behavior is normal. Her affect is blunt. She does not engage much   Assessment/Plan: 1. Functional deficits secondary to right frontal hemorrhage, small SAH and signs of PRES which require 3+ hours per day of interdisciplinary therapy in a comprehensive inpatient rehab setting. Physiatrist is providing close team supervision and 24 hour management of active medical problems listed below. Physiatrist and  rehab team continue to assess barriers to discharge/monitor patient progress toward functional and medical goals.  Function:  Bathing Bathing position   Position: Wheelchair/chair at sink  Bathing parts Body parts bathed by patient: Left arm, Chest, Abdomen, Front perineal area, Right upper leg, Left upper leg, Buttocks, Right lower leg, Left lower leg Body parts bathed by helper: Back, Right arm  Bathing assist Assist Level: Touching or steadying assistance(Pt > 75%)      Upper Body Dressing/Undressing Upper body dressing   What is the patient wearing?: Pull over shirt/dress, Bra Bra - Perfomed by patient: Thread/unthread right bra strap Bra - Perfomed by helper: Thread/unthread left bra strap, Hook/unhook bra (pull down sports bra) Pull over shirt/dress - Perfomed by patient: Thread/unthread right sleeve, Thread/unthread left sleeve, Put head through opening, Pull shirt over trunk Pull over shirt/dress - Perfomed by helper: Pull shirt over trunk        Upper body assist Assist Level: Supervision or verbal cues      Lower Body Dressing/Undressing Lower body dressing   What is the patient wearing?: Pants, Underwear, Shoes, AFO, Socks Underwear - Performed by patient: Thread/unthread right underwear leg, Thread/unthread left underwear leg, Pull underwear up/down Underwear - Performed by helper: Pull underwear up/down Pants- Performed by patient: Thread/unthread right pants leg, Thread/unthread left pants leg, Fasten/unfasten pants, Pull pants up/down Pants- Performed by helper:  Pull pants up/down Non-skid slipper socks- Performed by patient: Don/doff right sock Non-skid slipper socks- Performed by helper: Don/doff left sock Socks - Performed by patient: Don/doff right sock, Don/doff left sock Socks - Performed by helper: Don/doff left sock Shoes - Performed by patient: Fasten right, Don/doff right shoe Shoes - Performed by helper: Don/doff left shoe, Fasten left   AFO -  Performed by helper: Don/doff left AFO TED Hose - Performed by patient: Don/doff right TED hose TED Hose - Performed by helper: Don/doff right TED hose, Don/doff left TED hose  Lower body assist Assist for lower body dressing: Touching or steadying assistance (Pt > 75%)      Toileting Toileting Toileting activity did not occur: No continent bowel/bladder event Toileting steps completed by patient: Performs perineal hygiene Toileting steps completed by helper: Adjust clothing prior to toileting, Adjust clothing after toileting Toileting Assistive Devices: Grab bar or rail  Toileting assist Assist level: Touching or steadying assistance (Pt.75%)   Transfers Chair/bed transfer   Chair/bed transfer method: Squat pivot Chair/bed transfer assist level: Supervision or verbal cues Chair/bed transfer assistive device: Armrests     Locomotion Ambulation     Max distance: 10ft Assist level: Moderate assist (Pt 50 - 74%)   Wheelchair   Type: Manual Max wheelchair distance: 191ft Assist Level: No help, No cues, assistive device, takes more than reasonable amount of time  Cognition Comprehension Comprehension assist level: Follows complex conversation/direction with extra time/assistive device  Expression Expression assist level: Expresses complex 90% of the time/cues < 10% of the time  Social Interaction Social Interaction assist level: Interacts appropriately with others - No medications needed.  Problem Solving Problem solving assist level: Solves complex 90% of the time/cues < 10% of the time  Memory Memory assist level: Recognizes or recalls 90% of the time/requires cueing < 10% of the time    Medical Problem List and Plan: 1.  Decreased functional mobility secondary to right frontal hemorrhage, small SAH and signs of PRES  Cont CIR PT OT, SLP- PRAFO/WHO ,  Awaiting orthotic eval for AFO  Fluoxetine started 7/3, increased to 20 on 7/10  2.  DVT Prophylaxis/Anticoagulation: SCDs.    Vascular study neg 3. Pain Management: Tylenol as needed 4. Mood: Provide emotional support, pt feeling more optimistic about recovery 5. Neuropsych: This patient is  fully capable of making decisions on her own behalf.per Neuropsych Eval 6. Skin/Wound Care: Routine skin checks 7. Fluids/Electrolytes/Nutrition: Routine I&Os Intake 60-75% meals 7/22 8. Seizure prophylaxis. Keppra 500 mg every 12 hours 9. ESRD with history of right renal cancer status post nephrectomy. Continue peritoneal dialysis as per renal services 10. Hypertension. At home Norvasc 5 mg twice daily, Coreg 12.5 mg twice a day, Cozaar 50 mg daily, , amlodipine increased from 5- > 10mg  per Nephro on 7/11, Nephro also adjusting dialysate BP controlled 7/23   Vitals:   01/24/17 2236 01/25/17 0507  BP: 128/82 118/80  Pulse: 71 69  Resp:  16  Temp:  98.4 F (36.9 C)   11. Hypothyroidism. Synthroid 12.Constipation. Laxative assistance, last recorded BM, senna S 1 BID- BM 7/22     LOS (Days) 21 A FACE TO FACE EVALUATION WAS PERFORMED  Charlett Blake 01/25/2017 6:57 AM

## 2017-01-25 NOTE — Progress Notes (Signed)
[facility] Progress Note  Subjective: doing well, getting some use of her L leg back.  BP's good, wt's very stable at 47.9kg.   Vitals:   01/24/17 0812 01/24/17 1415 01/24/17 2236 01/25/17 0507  BP: 111/69 107/61 128/82 118/80  Pulse: 70 74 71 69  Resp: 18 17  16   Temp: 98.8 F (37.1 C) 98.9 F (37.2 C)  98.4 F (36.9 C)  TempSrc: Oral Oral  Oral  SpO2: 96% 97% 97% 98%  Weight: 47.9 kg (105 lb 9.6 oz)   47.9 kg (105 lb 11.1 oz)  Height:        Inpatient medications: . amLODipine  10 mg Oral Daily  . calcium acetate  1,334 mg Oral TID WC  . carvedilol  12.5 mg Oral BID WC  . famotidine  20 mg Oral Daily  . feeding supplement (NEPRO CARB STEADY)  237 mL Oral BID BM  . FLUoxetine  20 mg Oral Daily  . gentamicin cream  1 application Topical Daily  . levETIRAcetam  500 mg Oral BID  . levothyroxine  88 mcg Oral QAC breakfast  . multivitamin  1 tablet Oral Daily  . pantoprazole  40 mg Oral BID  . polyethylene glycol  17 g Oral Daily  . senna-docusate  2 tablet Oral BID   . dialysis solution 1.5% low-MG/low-CA     acetaminophen, bisacodyl, heparin, sorbitol  Exam: General: slender NAD HEENT: MMM NECK: no JVD CV Regular S1S2 No S3 PULM Lungs clear ABD: soft NT EXTNo LE edema NEURO Left hemiparesis, some improvement ACCESS: PD cath with clear dry dressing R abdomen; Right upper arm AVF aneurysmal/+ bruit   Dialysis:PD 5 exchanges overnight, no day bag, no pause, 2000 volume, using all 1.5%- UF of 1.5 liters   Summary: Pt is a 51 y.o.yo femalewith ESRD on PD who was admitted on 6/16/2018with seizures and L hemiparesis. Found to have right sided frontal lobe hemorrhage and PRES.  Had been transitioned from PD--> HD but had cardiac arrest 12/25/16 in inpatient HD.  Back on PD now.  Transferred to Rehab 01/04/17.  Assessment/ Plan: 1. CVA with R frontal SAH and PRES: Pt has L hemiparesis.  In CIR. Making good progress 2. Seizures: controlled on  Keppra. 3. ESRD: Continue CCPD. Use all 1.5%. Family in [place] are planning to take her after dc for 6 mos. They are going to train in [place] at the [facility] unit next week (Mon- Friday ,daily training), then [place] Ms Mcclintic [place] to live w/ them for ~ 6 mos. Have asked CIR to move d/c date to Monday 7/30 so that we can get as close to a full week of PD training as possible.  Have d/w Mr Shequila Neglia who is anticipating dc on Monday and will be here around 10 am to pick up the patient.  4. HTN/volume: stable.  EDW appears to be around 47 kg.  On Coreg and amlodipine. 5. Cardiac arrest: had been transitioned to HD in setting of L hemiparesis; cardiac arrest occurred while on HD 6/22. Refuses adamantly refused offer to resume HD again.  6. Anemia: history of GIB.  Responded toowell to ESA and and Hb now >13. Holding ESA 7. MBD: No sensipar, on Phoslo, Phos 6.0 so will increase to 2 caps TID and follow--> Ca inching upwards, may need to choose non-calcium based binder if has hypercalcemia again. 8. Nutrition: Alb 3.0, slowly improving; regular diet, Nepro, vits  9. Dispo: see above   Rob  [facility] pager [phone]   01/25/2017, 10:45 AM    Recent Labs Lab 01/24/17 0630  NA 137  K 4.1  CL 97*  CO2 23  GLUCOSE 100*  BUN 56*  CREATININE 15.50*  CALCIUM 9.1  PHOS 6.0*    Recent Labs Lab 01/24/17 0630  ALBUMIN 3.0*    Recent Labs Lab 01/24/17 0630  WBC 6.9  NEUTROABS 4.6  HGB 13.1  HCT 40.6  MCV 96.9  PLT 295   Iron/TIBC/Ferritin/ %Sat No results found for: IRON, TIBC, FERRITIN, IRONPCTSAT

## 2017-01-25 NOTE — Progress Notes (Signed)
Physical Therapy Session Note  Patient Details  Name: Terry Juarez MRN: [id number] Date of Birth: [birth year]  Today's Date: 01/25/2017 PT Individual Time: 1100-1200 PT Individual Time Calculation (min): 60 min   Short Term Goals: Week 3:  PT Short Term Goal 1 (Week 3): Pt will transfer consistently with supervision PT Short Term Goal 2 (Week 3): Pt will ambulated with LRAD 73ft with min assist PT Short Term Goal 3 (Week 3): Pt will ascend/descend 4steps with mod assist  PT Short Term Goal 4 (Week 3): Pt will demo static standing balance with R UE support with supervision PT Short Term Goal 5 (Week 3): Pt will perform car transfer with mod assist  Skilled Therapeutic Interventions/Progress Updates:    Pt up in w/c upon arrival, just finishing with OT. Pt propelling w/c mod I 150 ft including parts management including carpeted surface. Gait: ambulating on carpeted surface 30 ft X4 with min assist with LLE advancement. Also ambulating in gym with hard surface 75 ft X1, 50 ft X1, 30 ft X1 with variable assistance from min-mod assist using hemi walker and AFO. Pt requiring increased assist with advancing LLE with fatigue. NRE: working on LLE muscle recruitment in sitting and stand with focus on quadriceps and hip flexor musculature. Utilizing verbal and tactile cues for recruitment. Following session, pt returned to room, up in w/c with all needs in reach.   Therapy Documentation Precautions:  Precautions Precautions: Fall Precaution Comments: Peritoneal dialysis patient; L subluxed shoulder Restrictions Weight Bearing Restrictions: No    Pain:  Denies pain  See Function Navigator for Current Functional Status.   Therapy/Group: Individual Therapy  Linard Millers, PT 01/25/2017, 12:16 PM

## 2017-01-25 NOTE — Progress Notes (Signed)
Physical Therapy Session Note  Patient Details  Name: Terry Juarez MRN: [id number] Date of Birth: [birth year]  Today's Date: 01/25/2017 PT Individual Time: 1400-1515 PT Individual Time Calculation (min): 75 min   Short Term Goals: Week 3:  PT Short Term Goal 1 (Week 3): Pt will transfer consistently with supervision PT Short Term Goal 2 (Week 3): Pt will ambulated with LRAD 109f with min assist PT Short Term Goal 3 (Week 3): Pt will ascend/descend 4steps with mod assist  PT Short Term Goal 4 (Week 3): Pt will demo static standing balance with R UE support with supervision PT Short Term Goal 5 (Week 3): Pt will perform car transfer with mod assist  Skilled Therapeutic Interventions/Progress Updates:    pt c/o minimal L shoulder pain during upright tasks due to subluxation. PT treatment session focused on L LE NMR through exercises and ambulation.   Pt completing toileting with nurse tech upon arrival to room. Pt agreeable to PT treatment session. Pt wearing ground reaction force AFO on L LE throughout entire session. Pt performed w/c mobility to/from therapy gym using R UE and LE mod-I. Pt performed squat pivot transfers w/c <> mat with supervision both directions throughout session. Pt performed sit <> supine on mat with supervision and increased time with verbal cues for placement of L UE during changes in position. Pt performed side lying L LE hip and knee flexion/extension with maxislide for decreased friction. Pt performed supine bridges with ball squeeze 2x15 with 3 second hold for glute activation with noted increased activation since last treatment. Pt attempted L LE heel slides in supine with a noticed over activation of quadriceps preventing knee flexion. Pt sit <> stand throughout session using hemiwalker and armrests/mat table with supervision for safety. PT placed shoe cap on L toes to decrease friction during standing tasks and gait. Pt attempted pre-gait task of repeated L LE  advancement to simulate swing phase with hemiwalker and min assist for steadying and mod assist for L LE advancement. Pt ambulated 737fx2, break between, with hemiwalker and min assist for the first 259for steadying while pt able to advance and stabilize L LE without assistance. Pt progressed to requiring min-mod assist for remainder of walk for advancement of L LE due to pt fatigue. Pt returned to room performing squat pivot w/c to bed with supervision. Pt supine to sit with supervision and left lying in bed with call bell in reach and needs met.  Therapy Documentation Precautions:  Precautions Precautions: Fall Precaution Comments: Peritoneal dialysis patient; L subluxed shoulder Restrictions Weight Bearing Restrictions: No   See Function Navigator for Current Functional Status.   Therapy/Group: Individual Therapy  Natausha Jungwirth 01/25/2017, 4:31 PM

## 2017-01-25 NOTE — Progress Notes (Signed)
[facility] tx initiated via tenckhoff w/o problem, VSS, report given to Dorien Chihuahua, RN

## 2017-01-26 ENCOUNTER — Inpatient Hospital Stay (HOSPITAL_COMMUNITY): Payer: BLUE CROSS/BLUE SHIELD | Admitting: Occupational Therapy

## 2017-01-26 ENCOUNTER — Inpatient Hospital Stay (HOSPITAL_COMMUNITY): Payer: BLUE CROSS/BLUE SHIELD | Admitting: Physical Therapy

## 2017-01-26 ENCOUNTER — Encounter (HOSPITAL_COMMUNITY): Payer: BLUE CROSS/BLUE SHIELD | Admitting: Psychology

## 2017-01-26 LAB — GLUCOSE, CAPILLARY: GLUCOSE-CAPILLARY: 108 mg/dL — AB (ref 65–99)

## 2017-01-26 NOTE — Progress Notes (Signed)
[facility] tx initiated via tenckhoff w/o problem, VSS, reported off to primary nurse

## 2017-01-26 NOTE — Progress Notes (Signed)
Physical Therapy Session Note  Patient Details  Name: Terry Juarez MRN: [id number] Date of Birth: 1966/06/12  Today's Date: 01/26/2017 PT Individual Time: 1000-1100 PT Individual Time Calculation (min): 60 min   Short Term Goals: Week 3:  PT Short Term Goal 1 (Week 3): Pt will transfer consistently with supervision PT Short Term Goal 2 (Week 3): Pt will ambulated with LRAD 7f with min assist PT Short Term Goal 3 (Week 3): Pt will ascend/descend 4steps with mod assist  PT Short Term Goal 4 (Week 3): Pt will demo static standing balance with R UE support with supervision PT Short Term Goal 5 (Week 3): Pt will perform car transfer with mod assist  Skilled Therapeutic Interventions/Progress Updates:    no c/o pain. PT treatment session focused on dressing, NMR of the L LE while standing and during gait, and pt education for orthotic fitting.  Pt supine asleep in bed upon arrival, agreeable to PT treatment session. Pt supine to sit with HOB elevated, using bed rails with supervision for safety. Pt performed LB dressing using the figure 4 technique, with min assist for balance and occasional mod assist to regain LOB. Pt performed UB dressing with increased time and supervision for safety sitting EOB. Pt donned R and L socks and R shoe with min assist for sitting balance and PT donned L shoe and AFO. Pt ambulated 131fto/from bathroom with hemiwalker and min assist for steadying. Pt performed 3/3 toileting steps with min assist for steadying. Pt stood at sink washing face without UE support and min assist for steadying. Pt performed [id number]/c mobility mod-I to gym. Pt stand pivot transfer w/c to mat without AD and supervision for safety. Pt sit to stand using armrests from various surfaces (bed, mat, w/c) throughout session with supervision for safety. Pt performed R UE task in standing with min guard assist for balance and PT providing verbal and tactile cues for L knee extension and L weight shift  with noted increased difficulty with L LE control while performing dual task. Pt performed same task with mirror for visual input and hemiwalker for balance with increased L knee extension control. Pt attempted L LE step up onto 3in stair with R UE support and min guard assist with limited activation of hip flexors noted. Pt attempted B LE mobility in w/c with min assist for L LE advancement and pt able to pull minimally with L LE. Orthotic consult for shoe cap and AFO at discharge completed with pt education on type and purpose of AFO. Pt ambulated [id number] hemiwalker without AFO in non-slip socks with min assist for steadying and intermittent mod assist to help advance L LE. Pt had increased L LE step length likely due to less weight on the extremity. Pt left sitting in recliner with call bell in reach and needs met.   Therapy Documentation Precautions:  Precautions Precautions: Fall Precaution Comments: Peritoneal dialysis patient; L subluxed shoulder Restrictions Weight Bearing Restrictions: No  See Function Navigator for Current Functional Status.   Therapy/Group: Individual Therapy  Bhavya Eschete 01/26/2017, 11:28 AM

## 2017-01-26 NOTE — Plan of Care (Signed)
Problem: RH Dressing Goal: LTG Patient will perform upper body dressing (OT) LTG Patient will perform upper body dressing with assist, with/without cues (OT).  Goal upgraded 7/24 ESD

## 2017-01-26 NOTE — Progress Notes (Signed)
Physical Therapy Session Note  Patient Details  Name: Terry Juarez MRN: [id number] Date of Birth: 15-Sep-1965  Today's Date: 01/26/2017 PT Individual Time: 9326-7124 PT Individual Time Calculation (min): 88 min   Short Term Goals: Week 1:  PT Short Term Goal 1 (Week 1): Pt will transfer from bed to w/c with moderate assist in either direction  PT Short Term Goal 1 - Progress (Week 1): Met PT Short Term Goal 2 (Week 1): Pt will perform all bed mobility with min assist PT Short Term Goal 2 - Progress (Week 1): Met PT Short Term Goal 3 (Week 1): Pt will demonstrate dynamic standing balance for functional tasks requiring moderate assistance PT Short Term Goal 3 - Progress (Week 1): Not met PT Short Term Goal 4 (Week 1): Pt will propel w/c 50 ft in a controlled environment with supervision  PT Short Term Goal 4 - Progress (Week 1): Met Week 2:  PT Short Term Goal 1 (Week 2): Pt will ambulate with LRAD x50' with max assist PT Short Term Goal 1 - Progress (Week 2): Met PT Short Term Goal 2 (Week 2): Pt will transfer with consistent min assist PT Short Term Goal 2 - Progress (Week 2): Met PT Short Term Goal 3 (Week 2): Pt will initiate stair training with PT PT Short Term Goal 3 - Progress (Week 2): Met PT Short Term Goal 4 (Week 2): Pt will demo static standing balance with RUE support with min assist  PT Short Term Goal 4 - Progress (Week 2): Met Week 3:  PT Short Term Goal 1 (Week 3): Pt will transfer consistently with supervision PT Short Term Goal 2 (Week 3): Pt will ambulated with LRAD 43f with min assist PT Short Term Goal 3 (Week 3): Pt will ascend/descend 4steps with mod assist  PT Short Term Goal 4 (Week 3): Pt will demo static standing balance with R UE support with supervision PT Short Term Goal 5 (Week 3): Pt will perform car transfer with mod assist  Skilled Therapeutic Interventions/Progress Updates:   Pt in recliner and agreeable to therapy. No c/o pain.   Pt ambulated  to/from therapy gym, 100' and 514 w/ Max A for LLE placement and limb clearance w/ hemi-walker. Attempted use of quad cane at end of session, increased fatigue and continued w/ hemi-walker.   NuStep @ L3 to increase functional strength and endurance, 15 min. Manual facilitation to maintain LLE adduction, pt unable to maintain independently, will continue to monitor. No increase in fatigue w/ activity.   NMR w/ Min A to maintain balance in standing and to correct LOB   -Dynamic standing balance w/ emphasis on equal weight distribution on both LEs, tactile and verbal cues to LLE involvement and to activate quad, no UE support  -Step up/down w/ LLE, 3" step w/ hemi-walker UE support   -L single leg stance w/ hemi-walker UE support   -Dynamic sitting balance w/ UE reaching tasks outside of BOS and kicking soccer ball   -Reaching down to/from floor while in standing w/ hemi-walker UE support, verbal cues to increase LE flexion  Ended session in recliner, call bell within reach and all needs met.   Therapy Documentation Precautions:  Precautions Precautions: Fall Precaution Comments: Peritoneal dialysis patient; L subluxed shoulder Restrictions Weight Bearing Restrictions: No Vital Signs: Therapy Vitals Temp: 98.2 F (36.8 C) Temp Source: Oral Pulse Rate: 66 Resp: 16 BP: 116/68 Patient Position (if appropriate): Lying Oxygen Therapy SpO2: 98 % O2 Device: Not  Delivered   See Function Navigator for Current Functional Status.   Therapy/Group: Individual Therapy  Fahmida Jurich K Arnette 01/26/2017, 4:06 PM

## 2017-01-26 NOTE — Progress Notes (Signed)
Occupational Therapy Session Note  Patient Details  Name: Terry Juarez MRN: [id number] Date of Birth: 1966/05/15  Today's Date: 01/26/2017 OT Individual Time: 1300-1400 OT Individual Time Calculation (min): 60 min    Short Term Goals: Week 3:  OT Short Term Goal 1 (Week 3): Pt will complete L UE self-ROM without assistance OT Short Term Goal 2 (Week 3): Pt will maintain standing balance with min A while pulling pants over hips during BADLs OT Short Term Goal 3 (Week 3): Pt will independently place LUE into functional position during self-care tasks to improve awareness of LUE/minimize UE injury  Skilled Therapeutic Interventions/Progress Updates:    OT treatment session focused on functional bathroom transfers in simulated home environment, simple meal prep at wc level, and NMES. Pt completed toilet transfer and tub shower transfer with supervision and 1 LOB requiring min guard A to correct. Pt educated on proper wc placement during meal prep and technique. Pt-therapist collaboration completed regarding kitchen modifications at in-laws, home during completion of simulated meal prep. Provided pt with dysom for one-handed stirring and educated on other one-handed techniques. Pt stood at the sink to wash dishes using one-handed strategy, close supervision for standing balance. Pt then completed 15 mins of NMES on L UE focused on shoulder and wrist extensors. Applied kinesiotape to L UE to help approximate shoulder joint. Pt left seated in wc with needs met.   Therapy Documentation Precautions:  Precautions Precautions: Fall Precaution Comments: Peritoneal dialysis patient; L subluxed shoulder Restrictions Weight Bearing Restrictions: No  See Function Navigator for Current Functional Status.   Therapy/Group: Individual Therapy  [name] 01/26/2017, 1:57 PM

## 2017-01-26 NOTE — Progress Notes (Signed)
[place] PHYSICAL MEDICINE & REHABILITATION     PROGRESS NOTE  Subjective/Complaints:  Working with ground rxn force AFO in PT min A amb 25' wit HW  ROS: Denies CP, SOB, N/V/D.  Objective: Vital Signs: Blood pressure 137/80, pulse 73, temperature 98.6 F (37 C), temperature source Oral, resp. rate 12, height 5\' 5"  (1.651 m), weight 48.6 kg (107 lb 2.3 oz), SpO2 96 %. No results found.  Recent Labs  01/24/17 0630  WBC 6.9  HGB 13.1  HCT 40.6  PLT 295    Recent Labs  01/24/17 0630  NA 137  K 4.1  CL 97*  GLUCOSE 100*  BUN 56*  CREATININE 15.50*  CALCIUM 9.1   CBG (last 3)   Recent Labs  01/26/17 0624  GLUCAP 108*    Wt Readings from Last 3 Encounters:  01/25/17 48.6 kg (107 lb 2.3 oz)  01/04/17 50.7 kg (111 lb 12.4 oz)  09/04/16 54.4 kg (120 lb)    Physical Exam:  BP 137/80 (BP Location: Left Arm)   Pulse 73   Temp 98.6 F (37 C) (Oral)   Resp 12   Ht 5\' 5"  (1.651 m)   Wt 48.6 kg (107 lb 2.3 oz)   SpO2 96%   BMI 17.83 kg/m  Constitutional: She appears well-developed. Frail  HENT: Normocephalic and atraumatic.  Eyes: EOMI. No discharge.  Cardiovascular: RRR. No JVD. Respiratory: CTA Bilaterally. Normal effort .  GI: Soft. Bowel sounds are normal.   Musculoskeletal: She exhibits no edema or tenderness.  Neurological: She is alert.  Fair awareness of deficits.  Motor: RUE/RLE: 5/5 LUE 0/5 except 2- triceps /LLE: 3-/5 left Hip/knee ext synergy, L foot drop No increase in tone Tone: Ashworth 1 in arm adductors o/w 0 in UE Skin: Skin is warm and dry.  Psychiatric: Her behavior is normal. Her affect is blunt. She does not engage much   Assessment/Plan: 1. Functional deficits secondary to right frontal hemorrhage, small SAH and signs of PRES which require 3+ hours per day of interdisciplinary therapy in a comprehensive inpatient rehab setting. Physiatrist is providing close team supervision and 24 hour management of active medical problems  listed below. Physiatrist and rehab team continue to assess barriers to discharge/monitor patient progress toward functional and medical goals.  Function:  Bathing Bathing position   Position: Shower  Bathing parts Body parts bathed by patient: Left arm, Chest, Abdomen, Front perineal area, Buttocks, Right upper leg, Left upper leg, Right lower leg, Left lower leg Body parts bathed by helper: Right arm, Back  Bathing assist Assist Level: Touching or steadying assistance(Pt > 75%)      Upper Body Dressing/Undressing Upper body dressing   What is the patient wearing?: Pull over shirt/dress Bra - Perfomed by patient: Thread/unthread right bra strap Bra - Perfomed by helper: Thread/unthread left bra strap, Hook/unhook bra (pull down sports bra) Pull over shirt/dress - Perfomed by patient: Thread/unthread right sleeve, Thread/unthread left sleeve, Put head through opening, Pull shirt over trunk Pull over shirt/dress - Perfomed by helper: Pull shirt over trunk        Upper body assist Assist Level: Supervision or verbal cues      Lower Body Dressing/Undressing Lower body dressing   What is the patient wearing?: Underwear, Pants, Socks Underwear - Performed by patient: Thread/unthread right underwear leg, Thread/unthread left underwear leg, Pull underwear up/down Underwear - Performed by helper: Pull underwear up/down Pants- Performed by patient: Thread/unthread right pants leg, Thread/unthread left pants leg, Pull pants  up/down Pants- Performed by helper: Pull pants up/down Non-skid slipper socks- Performed by patient: Don/doff right sock Non-skid slipper socks- Performed by helper: Don/doff left sock Socks - Performed by patient: Don/doff right sock, Don/doff left sock Socks - Performed by helper: Don/doff left sock Shoes - Performed by patient: Don/doff right shoe Shoes - Performed by helper: Don/doff left shoe   AFO - Performed by helper: Don/doff left AFO TED Hose - Performed  by patient: Don/doff right TED hose TED Hose - Performed by helper: Don/doff right TED hose, Don/doff left TED hose  Lower body assist Assist for lower body dressing: Touching or steadying assistance (Pt > 75%)      Toileting Toileting Toileting activity did not occur: No continent bowel/bladder event Toileting steps completed by patient: Performs perineal hygiene Toileting steps completed by helper: Adjust clothing prior to toileting, Adjust clothing after toileting Toileting Assistive Devices: Grab bar or rail  Toileting assist Assist level: Touching or steadying assistance (Pt.75%)   Transfers Chair/bed transfer   Chair/bed transfer method: Squat pivot Chair/bed transfer assist level: Supervision or verbal cues Chair/bed transfer assistive device: Armrests     Locomotion Ambulation     Max distance: 48ft Assist level: Touching or steadying assistance (Pt > 75%)   Wheelchair   Type: Manual Max wheelchair distance: 150ft Assist Level: No help, No cues, assistive device, takes more than reasonable amount of time  Cognition Comprehension Comprehension assist level: Follows complex conversation/direction with extra time/assistive device  Expression Expression assist level: Expresses complex ideas: With extra time/assistive device  Social Interaction Social Interaction assist level: Interacts appropriately with others with medication or extra time (anti-anxiety, antidepressant).  Problem Solving Problem solving assist level: Solves complex problems: With extra time  Memory Memory assist level: Recognizes or recalls 90% of the time/requires cueing < 10% of the time    Medical Problem List and Plan: 1.  Decreased functional mobility secondary to right frontal hemorrhage, small SAH and signs of PRES  Cont CIR PT OT, SLP-team conf in am PRAFO/WHO ,  Awaiting orthotic eval for AFO- order written 4 d ago  Fluoxetine started 7/3, increased to 20 on 7/10  2.  DVT  Prophylaxis/Anticoagulation: SCDs.   Vascular study neg 3. Pain Management: Tylenol as needed 4. Mood: Provide emotional support, pt feeling more optimistic about recovery 5. Neuropsych: This patient is  fully capable of making decisions on her own behalf.per Neuropsych Eval 6. Skin/Wound Care: Routine skin checks 7. Fluids/Electrolytes/Nutrition: Routine I&Os Intake  8. Seizure prophylaxis. Keppra 500 mg every 12 hours 9. ESRD with history of right renal cancer status post nephrectomy. Continue peritoneal dialysis as per renal services 10. Hypertension. At home Norvasc 5 mg twice daily, Coreg 12.5 mg twice a day, Cozaar 50 mg daily, , amlodipine increased from 5- > 10mg  per Nephro on 7/11, Nephro also adjusting dialysate BP controlled 7/24   Vitals:   01/25/17 1900 01/26/17 0510  BP: 127/74 137/80  Pulse: 73 73  Resp:  12  Temp:  98.6 F (37 C)   11. Hypothyroidism. Synthroid 12.Constipation. Laxative assistance, last recorded BM, senna S 1 BID- BM x 2 Cont 7/23     LOS (Days) 22 A FACE TO FACE EVALUATION WAS PERFORMED  Charlett Blake 01/26/2017 6:58 AM

## 2017-01-26 NOTE — Consult Note (Signed)
Neuropsychological Consultation   Patient:   Terry Juarez   DOB:   1965-12-15  MR Number:  [id number]  Location:  [place] [address] [facility] [address] [id number] [place] [address] Dept: [place]: [phone]           Date of Service:   01/26/2017  Start Time:   2 PM End Time:   3 PM  Provider/Observer:  Ilean Skill, Psy.D.       Clinical Neuropsychologist       Billing Code/Service: 32355 4 Units  Chief Complaint:    Terry Juarez, a 51 year old female, presented on 12/19/2016 with new onset seizure, acute left-sided weakness and right frontal headache. CT scan showed a 29 mm right frontal hemorrhage with surrounding edema and mass effect as well as small subarachnoid hemorrhage and signs of PRES.  no AVMs were imaged.  Later MRI showed stable hemorrhage and EEG at the time suggested diffuse cerebral dysfunction without active seizure activity.  The patient initially had confusion and other cognitive dysfunction, likely due to seizure activity.  There was a need to change power of attorney and healthcare power of attorney so review of neuropsychological functioning and competency for these legal decisions was requested.  I have followed up with the patient and she is now doing much better with mood and adjustment but still severe motor impairments in left arm and leg with leg improving some.  Reason for Service:  Below is the HPI for the current admission:    Terry D Mooreis a 51 y.o.right handed femalewith history of hypertension,history of GI bleed, ESRD with home peritoneal dialysis, right renal cancer status post nephrectomy.Per chart review and mother-in-law patient lives in [place] with a roommate. Independent prior to admission. One level home with 3 steps to entry. Question 24-hour assistance on discharge.Presented 12/19/2016 with new onset of seizure, acute left-sided weakness and right  frontal headache. UDS positive for benzos. Troponin 1.21. CT scan imaging showed a 29 mm right frontal hemorrhage with surrounding edema and mass effect, also a small SAH and signs of PRES. CTA head and neck showed no AVMs but showed the hematoma was mildly increased from the earlier imaging. Maintain on nicardipine drip as well as Keppra. MRI of the brain reviewed, showing stable hemorrhage. Latest cranial CT head 12/24/2016 reviewed, stable. EEG showed diffuse cerebral dysfunction no seizure activity.Dialysis ongoing as per renal services. Dysphagia #3 thin liquid diet. Therapy evaluations completed 12/22/2016 with recommendations of physical medicine rehabilitation consult.Patient was admitted for a comprehensive rehabilitation program  Current Status:  The patient is continuing to do much better with regard to cognitive functioning.  Speech, executive functioning, memory, visual spatial, and attention appear to be improving and at this point are to the level that would suggest she is competent to make these decisions.  The patient continues with severe hemiparesis of the left side related to recent right hemisphere hemorrhage.  We still see further improvements with mood but motor function is still severely impaired on left side.   Behavioral Observation: Terry Juarez  presents as a 51 y.o.-year-old Right African American Female who appeared her stated age. her dress was Appropriate and she was Well Groomed and her manners were Appropriate to the situation.  her participation was indicative of Appropriate behaviors.  There were  physical disabilities noted.  she displayed an appropriate level of cooperation and motivation.     Interactions:    Active Appropriate  Attention:   within normal limits but a little below what would normally be expected for age  Memory:   within normal limits; recent and remote memory intact  Visuo-spatial:  within normal limits  Speech  (Volume):  low  Speech:   normal; normal  Thought Process:  Coherent  Though Content:  WNL;   Orientation:   person, place, time/date and situation  Judgment:   Good  Planning:   Fair  Affect:    Flat  Mood:    Depressed  Insight:   Good  Intelligence:   normal  Medical History:   Past Medical History:  Diagnosis Date  . Anemia   . Bruises easily   . Dialysis patient ([place])   . ESRD on dialysis ([place])   . Hyperlipidemia   . Hypertension   . Renal disorder    rt renal mass / < functioning of left kidney - being prepared for possible dialysis  . Right renal mass         Family Med/Psych History:  Family History  Problem Relation Age of Onset  . Throat cancer Mother        smoked  . Hypertension Father     Risk of Suicide/Violence: low No indications of SI or HI  Impression/DX:  Terry Juarez, a 51 year old female, presented on 12/19/2016 with new onset seizure, acute left-sided weakness and right frontal headache. CT scan showed a 29 mm right frontal hemorrhage with surrounding edema and mass effect as well as small subarachnoid hemorrhage and signs of PRES.  no AVMs were imaged.  Later MRI showed stable hemorrhage and EEG at the time suggested diffuse cerebral dysfunction without active seizure activity.  The patient initially had confusion and other cognitive dysfunction, likely due to seizure activity.  There was a need to change power of attorney and healthcare power of attorney so review of neuropsychological functioning and competency for these legal decisions was requested.  The patient produced a perfect score on Mental Status exam.  She did have some memory loss of events after the right frontal and subarachnoid hemorrhage event.  She is doing well from a cognitive and neuropsychological perspective and is competent to make the legal decisions she has been in the process of doing.    Disposition/Plan:  Will see the patient again later in week to deal with coping  and adjustment to current loss of motor function on left side of her body.          Electronically Signed   _______________________ Ilean Skill, Psy.D.

## 2017-01-27 ENCOUNTER — Inpatient Hospital Stay (HOSPITAL_COMMUNITY): Payer: Self-pay | Admitting: Physical Therapy

## 2017-01-27 ENCOUNTER — Inpatient Hospital Stay (HOSPITAL_COMMUNITY): Payer: BLUE CROSS/BLUE SHIELD | Admitting: Occupational Therapy

## 2017-01-27 ENCOUNTER — Inpatient Hospital Stay (HOSPITAL_COMMUNITY): Payer: BLUE CROSS/BLUE SHIELD | Admitting: Physical Therapy

## 2017-01-27 NOTE — Progress Notes (Signed)
[place] PHYSICAL MEDICINE & REHABILITATION     PROGRESS NOTE  Subjective/Complaints:   No issues overnite, no shoulder pain  ROS: Denies CP, SOB, N/V/D.  Objective: Vital Signs: Blood pressure 125/81, pulse 70, temperature 98.3 F (36.8 C), temperature source Oral, resp. rate 16, height '5\' 5"'  (1.651 m), weight 46.7 kg (102 lb 15.3 oz), SpO2 97 %. No results found. No results for input(s): WBC, HGB, HCT, PLT in the last 72 hours. No results for input(s): NA, K, CL, GLUCOSE, BUN, CREATININE, CALCIUM in the last 72 hours.  Invalid input(s): CO CBG (last 3)   Recent Labs  01/26/17 0624  GLUCAP 108*    Wt Readings from Last 3 Encounters:  01/26/17 46.7 kg (102 lb 15.3 oz)  01/04/17 50.7 kg (111 lb 12.4 oz)  09/04/16 54.4 kg (120 lb)    Physical Exam:  BP 125/81 (BP Location: Left Arm)   Pulse 70   Temp 98.3 F (36.8 C) (Oral)   Resp 16   Ht '5\' 5"'  (1.651 m)   Wt 46.7 kg (102 lb 15.3 oz)   SpO2 97%   BMI 17.13 kg/m  Constitutional: She appears well-developed. Frail  HENT: Normocephalic and atraumatic.  Eyes: EOMI. No discharge.  Cardiovascular: RRR. No JVD. Respiratory: CTA Bilaterally. Normal effort .  GI: Soft. Bowel sounds are normal.   Musculoskeletal: She exhibits no edema or tenderness.  Neurological: She is alert.  Fair awareness of deficits.  Motor: RUE/RLE: 5/5 LUE 0/5 except 2- triceps /LLE: 3-/5 left Hip/knee ext synergy, L foot drop No increase in tone Tone: Ashworth 1 in arm adductors o/w 0 in UE Skin: Skin is warm and dry.  Psychiatric: Her behavior is normal. Her affect is blunt. She does not engage much   Assessment/Plan: 1. Functional deficits secondary to right frontal hemorrhage, small SAH and signs of PRES which require 3+ hours per day of interdisciplinary therapy in a comprehensive inpatient rehab setting. Physiatrist is providing close team supervision and 24 hour management of active medical problems listed below. Physiatrist and  rehab team continue to assess barriers to discharge/monitor patient progress toward functional and medical goals.  Function:  Bathing Bathing position   Position: Shower  Bathing parts Body parts bathed by patient: Left arm, Chest, Abdomen, Front perineal area, Buttocks, Right upper leg, Left upper leg, Right lower leg, Left lower leg Body parts bathed by helper: Right arm, Back  Bathing assist Assist Level: Touching or steadying assistance(Pt > 75%)      Upper Body Dressing/Undressing Upper body dressing   What is the patient wearing?: Pull over shirt/dress Bra - Perfomed by patient: Thread/unthread right bra strap Bra - Perfomed by helper: Thread/unthread left bra strap, Hook/unhook bra (pull down sports bra) Pull over shirt/dress - Perfomed by patient: Thread/unthread right sleeve, Thread/unthread left sleeve, Put head through opening, Pull shirt over trunk Pull over shirt/dress - Perfomed by helper: Pull shirt over trunk        Upper body assist Assist Level: Supervision or verbal cues      Lower Body Dressing/Undressing Lower body dressing   What is the patient wearing?: Underwear, Pants, Socks, Shoes, AFO Underwear - Performed by patient: Thread/unthread right underwear leg, Thread/unthread left underwear leg, Pull underwear up/down Underwear - Performed by helper: Pull underwear up/down Pants- Performed by patient: Thread/unthread right pants leg, Thread/unthread left pants leg, Pull pants up/down Pants- Performed by helper: Pull pants up/down Non-skid slipper socks- Performed by patient: Don/doff right sock Non-skid slipper socks- Performed  by helper: Don/doff left sock Socks - Performed by patient: Don/doff right sock Socks - Performed by helper: Don/doff right sock Shoes - Performed by patient: Don/doff right shoe, Fasten right Shoes - Performed by helper: Don/doff left shoe, Fasten left   AFO - Performed by helper: Don/doff left AFO TED Hose - Performed by patient:  Don/doff right TED hose TED Hose - Performed by helper: Don/doff right TED hose, Don/doff left TED hose  Lower body assist Assist for lower body dressing: Touching or steadying assistance (Pt > 75%)      Toileting Toileting Toileting activity did not occur: No continent bowel/bladder event Toileting steps completed by patient: Adjust clothing prior to toileting, Performs perineal hygiene, Adjust clothing after toileting Toileting steps completed by helper: Adjust clothing prior to toileting, Adjust clothing after toileting Toileting Assistive Devices: Grab bar or rail, Other (comment) (hemiwalker)  Toileting assist Assist level: Touching or steadying assistance (Pt.75%)   Transfers Chair/bed transfer   Chair/bed transfer method: Stand pivot Chair/bed transfer assist level: Supervision or verbal cues Chair/bed transfer assistive device: Armrests     Locomotion Ambulation     Max distance: 154f Assist level: Touching or steadying assistance (Pt > 75%)   Wheelchair   Type: Manual Max wheelchair distance: 1537fAssist Level: No help, No cues, assistive device, takes more than reasonable amount of time  Cognition Comprehension Comprehension assist level: Follows complex conversation/direction with no assist  Expression Expression assist level: Expresses complex ideas: With no assist  Social Interaction Social Interaction assist level: Interacts appropriately with others with medication or extra time (anti-anxiety, antidepressant).  Problem Solving Problem solving assist level: Solves complex problems: With extra time  Memory Memory assist level: Recognizes or recalls 90% of the time/requires cueing < 10% of the time    Medical Problem List and Plan: 1.  Decreased functional mobility secondary to right frontal hemorrhage, small SAH and signs of PRES  Cont CIR PT OT, SLP-Team conference today please see physician documentation under team conference tab, met with team face-to-face to  discuss problems,progress, and goals. Formulized individual treatment plan based on medical history, underlying problem and comorbidities. PRAFO/WHO ,    Fluoxetine started 7/3, increased to 20 on 7/10  2.  DVT Prophylaxis/Anticoagulation: SCDs.   Vascular study neg- no clinical sign DVT 3. Pain Management: Tylenol as needed 4. Mood: Provide emotional support, pt feeling more optimistic about recovery 5. Neuropsych: This patient is  fully capable of making decisions on her own behalf.per Neuropsych Eval 6. Skin/Wound Care: Routine skin checks 7. Fluids/Electrolytes/Nutrition: Routine I&Os Intake  8. Seizure prophylaxis. Keppra 500 mg every 12 hours 9. ESRD with history of right renal cancer status post nephrectomy. Continue peritoneal dialysis as per renal services 10. Hypertension. At home Norvasc 5 mg twice daily, Coreg 12.5 mg twice a day, Cozaar 50 mg daily, , amlodipine increased from 5- > 1030mer Nephro on 7/11, Nephro also adjusting dialysate BP controlled 7/25   Vitals:   01/26/17 2040 01/27/17 0502  BP: 118/67 125/81  Pulse: 72 70  Resp:  16  Temp:  98.3 F (36.8 C)   11. Hypothyroidism. Synthroid 12.Constipation. Laxative assistance, last recorded BM, senna S 1 BID- BM x 2 Cont 7/23     LOS (Days) 23 A FACE TO FACE EVALUATION WAS PERFORMED  Ottis Vacha E 01/27/2017 7:26 AM

## 2017-01-27 NOTE — Progress Notes (Signed)
[facility] Progress Note  Subjective: doing well  Vitals:   01/26/17 2040 01/27/17 0502 01/27/17 0830 01/27/17 1010  BP: 118/67 125/81 119/77   Pulse: 72 70 68   Resp:  16 18   Temp:  98.3 F (36.8 C) 98.7 F (37.1 C)   TempSrc:  Oral Oral   SpO2: 96% 97%    Weight:   47.5 kg (104 lb 11.5 oz) 48.6 kg (107 lb 1.6 oz)  Height:        Inpatient medications: . amLODipine  10 mg Oral Daily  . calcium acetate  1,334 mg Oral TID WC  . carvedilol  12.5 mg Oral BID WC  . famotidine  20 mg Oral Daily  . FLUoxetine  20 mg Oral Daily  . gentamicin cream  1 application Topical Daily  . levETIRAcetam  500 mg Oral BID  . levothyroxine  88 mcg Oral QAC breakfast  . multivitamin  1 tablet Oral Daily  . pantoprazole  40 mg Oral BID  . polyethylene glycol  17 g Oral Daily  . senna-docusate  2 tablet Oral BID   . dialysis solution 1.5% low-MG/low-CA     acetaminophen, bisacodyl, heparin, sorbitol  Exam: General: slender NAD HEENT: MMM NECK: no JVD CV Regular S1S2 No S3 PULM Lungs clear ABD: soft NT EXTNo LE edema NEURO Left hemiparesis, some improvement ACCESS: PD cath with clear dry dressing R abdomen; Right upper arm AVF aneurysmal/+ bruit   Dialysis:PD 5 exchanges overnight, no day bag, no pause, 2000 volume, using all 1.5%- UF of 1.5 liters   Summary: Pt is a 51 y.o.yo femalewith ESRD on PD who was admitted on 6/16/2018with seizures and L hemiparesis. Found to have right sided frontal lobe hemorrhage and PRES.  Had been transitioned from PD--> HD but had cardiac arrest 12/25/16 in inpatient HD.  Back on PD now.  Transferred to Rehab 01/04/17.  Assessment/ Plan: 1. CVA with R frontal SAH and PRES: Pt has L hemiparesis.  In CIR. Making good progress 2. Seizures: controlled on Keppra. 3. ESRD: Continue CCPD. Use all 1.5%. Family in [place] are planning to take her after dc for 6 mos. They are going to train in [place] at the Home Training unit next  week (Mon- Friday ,daily training), then [place] Ms Fli[place]in down [place] to live w/ them for ~ 6 mos. Have d/w Mr Caroleann Casler who is anticipating dc next Monday and will be here around 10 am to pick up the patient and take her to 1st day of PD training.  4. HTN/volume: stable.  EDW appears to be around 47 kg.  On Coreg and amlodipine. 5. Cardiac arrest: had been transitioned to HD in setting of L hemiparesis; cardiac arrest occurred while on HD 6/22. Refuses adamantly refused offer to resume HD again.  6. Anemia: history of GIB.  Responded toowell to ESA and and Hb now >13. Holding ESA 7. MBD: No sensipar, on Phoslo, Phos 6.0 so will increase to 2 caps TID and follow--> Ca inching upwards, may need to choose non-calcium based binder if has hypercalcemia again. 8. Nutrition: Alb 3.0, slowly improving; regular diet, Nepro, vits  9. Dispo: see above   [name] [facility] pager [phone]   01/27/2017, 11:05 AM    Recent Labs Lab 01/24/17 0630  NA 137  K 4.1  CL 97*  CO2 23  GLUCOSE 100*  BUN 56*  CREATININE 15.50*  CALCIUM 9.1  PHOS 6.0*    Recent  Labs Lab 01/24/17 0630  ALBUMIN 3.0*    Recent Labs Lab 01/24/17 0630  WBC 6.9  NEUTROABS 4.6  HGB 13.1  HCT 40.6  MCV 96.9  PLT 295   Iron/TIBC/Ferritin/ %Sat No results found for: IRON, TIBC, FERRITIN, IRONPCTSAT

## 2017-01-27 NOTE — Progress Notes (Signed)
Physical Therapy Session Note  Patient Details  Name: Terry Juarez MRN: [id number] Date of Birth: April 12, 1966  Today's Date: 01/27/2017 PT Individual Time: 1000-1100 PT Individual Time Calculation (min): 60 min  Short Term Goals: Week 3:  PT Short Term Goal 1 (Week 3): Pt will transfer consistently with supervision PT Short Term Goal 2 (Week 3): Pt will ambulated with LRAD 64ft with min assist PT Short Term Goal 3 (Week 3): Pt will ascend/descend 4steps with mod assist  PT Short Term Goal 4 (Week 3): Pt will demo static standing balance with R UE support with supervision PT Short Term Goal 5 (Week 3): Pt will perform car transfer with mod assist  Skilled Therapeutic Interventions/Progress Updates:    no c/o pain. PT treatment session focused on functional transfers, ambulation, and stair negotiation.  Pt performs w/c mobility 337ft mod-I from room to day room. Pt stand pivot transfer w/c <> recliner x2 with supervision and verbal cues for set-up with location of w/c and to transfer going L into the recliner. Pt able to verbalize and demonstrate proper setup and transfer. PT rolled pt in w/c to various gyms to save time. Pt performed stand pivot transfer w/c <> car with min assist for steadying and to lift L LE into simulated van height vehicle. Pt ascended/descended 4 stairs with min assist for steadying with verbal cues for proper LE placement on step. Pt ambulated 124ft with hemiwalker with L UE in GivMohr sling and min assist for steadying with tactile cues to prevent L knee hyperextension. Pt attempted to ambulate with Harmon Pier walker with decreased L LE advancement in swing and increased assistance required. Pt advised to continue ambulating with hemiwalker and discussed attempting quad cane next session. Pt ambulated 60ft with hemiwalker and min assist to mat table. Pt performed stand pivot transfer from mat to w/c with supervision and LOB with pt able to regain balance without assist. Pt  returned to room in w/c and L UE in GivMohr sling with call bell in reach.   Therapy Documentation Precautions:  Precautions Precautions: Fall Precaution Comments: Peritoneal dialysis patient; L subluxed shoulder Restrictions Weight Bearing Restrictions: No  See Function Navigator for Current Functional Status.   Therapy/Group: Individual Therapy  Jaydee Conran 01/27/2017, 12:08 PM

## 2017-01-27 NOTE — Progress Notes (Signed)
Physical Therapy Session Note  Patient Details  Name: Terry Juarez MRN: [id number] Date of Birth: 07/11/1965  Today's Date: 01/27/2017 PT Individual Time: 1600-1630 PT Individual Time Calculation (min): 30 min   Short Term Goals: Week 3:  PT Short Term Goal 1 (Week 3): Pt will transfer consistently with supervision PT Short Term Goal 2 (Week 3): Pt will ambulated with LRAD 69f with min assist PT Short Term Goal 3 (Week 3): Pt will ascend/descend 4steps with mod assist  PT Short Term Goal 4 (Week 3): Pt will demo static standing balance with R UE support with supervision PT Short Term Goal 5 (Week 3): Pt will perform car transfer with mod assist  Skilled Therapeutic Interventions/Progress Updates:    no c/o pain. PT treatment session focused on L UE NMR via mirror therapy and ambulation.  Pt sitting in w/c with GivMohr sling on upon arrival, agreeable to PT treatment session. Pt performed w/c mobility 142froom to gym mod-I. Pt setup w/c for stand pivot transfer to mat without cues and performed transfer mod-I. Pt approved for mod-I bed <> w/c transfers in room. Pt setup for mirror therapy of UEs with UEs placed on table. Pt performed x5 forearm pronation to fatigue of B UEs in mirror with assistance to return to supination. Pt able to complete 3 sets of 10 consecutive forearm pronation without assistance, 2 with mirror feedback and 1 at end of session without mirror. Pt ambulated 14580fith hemiwalker and min assist for steadying from gym to bed in room. Pt sit to supine mod-I with increased time. Pt donned yellow grip socks and PT instructed pt must wear those in room to be mod-I with w/c <> bed tranfers. PT setup w/c in preparation for if pt wanted to transfer out of bed. Pt left supine in bed with call bell in reach and needs met.  Therapy Documentation Precautions:  Precautions Precautions: Fall Precaution Comments: Peritoneal dialysis patient; L subluxed  shoulder Restrictions Weight Bearing Restrictions: No  See Function Navigator for Current Functional Status.   Therapy/Group: Individual Therapy  Norie Latendresse 01/27/2017, 5:27 PM

## 2017-01-27 NOTE — Progress Notes (Signed)
Occupational Therapy Session Note  Patient Details  Name: Terry Juarez MRN: [id number] Date of Birth: 25-Dec-1965  Today's Date: 01/27/2017  Session 1 OT Individual Time: 0900-1000 OT Individual Time Calculation (min): 60 min   Session 2 OT Individual Time: 8119-1478 OT Individual Time Calculation (min): 30 min   Session 3 OT Individual Time: 2956-2130 OT Individual Time Calculation (min): 32 min   Short Term Goals: Week 3:  OT Short Term Goal 1 (Week 3): Pt will complete L UE self-ROM without assistance OT Short Term Goal 2 (Week 3): Pt will maintain standing balance with min A while pulling pants over hips during BADLs OT Short Term Goal 3 (Week 3): Pt will independently place LUE into functional position during self-care tasks to improve awareness of LUE/minimize UE injury  Skilled Therapeutic Interventions/Progress Updates:  Session 1   OT treatment session focused on functional transfers and increased independence with bathing/dressing tasks. OT applied waterproof dressing to PD site, then pt transferred sup<>sit with increased time and supervision. Stand-pivot transfer to wc on L with min A and improved head/hips relationship. Stand-pivot onto toilet using grab bars with supervision to R and min A to L. Pt able to complete 3/3 toileting steps with supervision. Bathing completed with min A to don shower mit onto L hand, then incorporated L NMR techniques to weight bearing through L UE when washing. Pt gathered clothing wc level with min A to negotiate wc in crowded room. UB dressing using hemi techniques with supervision, then LB dressing with close supervision and intermittent min guard A for balance when pulling pants over hips. Pt with much improved standing balance. Pt able to don socks as well, but required assistance to push L heel into shoe. Pt utilized shoe buttons to fasten shoes and was left seated in wc with needs met.   Session 2 OT treatment session focused on L NMR,  sling management, and L shoulder subluxation. Pt propelled wc to therapy gym and completed stand-pivot to mat with supervision. Educated pt on positioning and donning/doffing sling, with py completing with min A. L NMR with weight bearing and joint input to L UE. OT then placed kinesiotape on L UE to help support and approximate shoulder joint. Pt returned to room, completed stand-pivot to recliner supervision and left with needs met.   Session 3 OT treatment session focused on dc planning, functional shower transfers, and L UE NMES. Practiced walk-in shower transfer using drop-arm BSC. Pt able to stand-pivot onto chiar, then swing legs over small ledge with supervision. Pt  1:1 NMES applied wrist extrnsors. Incorporated weight bearing with pushing and pulling on friction reducing surface. Slight improvement distal shoulder flex/ext and forearm pronation. Pt returned to room at end of session and left with needs met.   Ratio 1:1 Rate 35 pps Waveform- Asymmetric Ramp 1.0 Pulse 300 Intensity- 27 Duration -  15 mins   Therapy Documentation Precautions:  Precautions Precautions: Fall Precaution Comments: Peritoneal dialysis patient; L subluxed shoulder Restrictions Weight Bearing Restrictions: No Pain:  none/denies pain.  ADL: ADL ADL Comments: refer to functional navigator  See Function Navigator for Current Functional Status.   Therapy/Group: Individual Therapy  [name] 01/27/2017, 3:33 PM

## 2017-01-27 NOTE — Progress Notes (Signed)
[facility] tx initiated via tenckhoff w/o problem, VSS, report given to Roney Marion, RN @ [phone]

## 2017-01-28 ENCOUNTER — Inpatient Hospital Stay (HOSPITAL_COMMUNITY): Payer: BLUE CROSS/BLUE SHIELD | Admitting: Occupational Therapy

## 2017-01-28 ENCOUNTER — Inpatient Hospital Stay (HOSPITAL_COMMUNITY): Payer: BLUE CROSS/BLUE SHIELD | Admitting: Physical Therapy

## 2017-01-28 DIAGNOSIS — K5901 Slow transit constipation: Secondary | ICD-10-CM

## 2017-01-28 MED ORDER — SENNOSIDES-DOCUSATE SODIUM 8.6-50 MG PO TABS
3.0000 | ORAL_TABLET | Freq: Two times a day (BID) | ORAL | Status: DC
  Administered 2017-01-28 – 2017-01-31 (×5): 3 via ORAL
  Filled 2017-01-28 (×9): qty 3

## 2017-01-28 NOTE — Progress Notes (Signed)
Physical Therapy Session Note  Patient Details  Name: Terry Juarez MRN: [id number] Date of Birth: Jun 16, 1966  Today's Date: 01/28/2017 PT Individual Time: 7414-2395 PT Individual Time Calculation (min): 45 min   Short Term Goals: Week 3:  PT Short Term Goal 1 (Week 3): Pt will transfer consistently with supervision PT Short Term Goal 2 (Week 3): Pt will ambulated with LRAD 30ft with min assist PT Short Term Goal 3 (Week 3): Pt will ascend/descend 4steps with mod assist  PT Short Term Goal 4 (Week 3): Pt will demo static standing balance with R UE support with supervision PT Short Term Goal 5 (Week 3): Pt will perform car transfer with mod assist  Skilled Therapeutic Interventions/Progress Updates:    no c/o pain. PT treatment session focused on PNF and NMR of L UE and LE. Pt sitting in w/c upon arrival with GivMohr sling on, pt agreeable to PT treatment session. Pt performed w/c mobility 145ft to gym mod-I. Pt set-up transfer to mat and stand pivot mod-I. Pt sit to supine mod-I on mat with increased time. Pt performed D1 flexion/extension pattern of L LE starting with rhythmic initiation and progressing to light resistance. Pt able to perform extension portion with resistance and flexion portion against gravity after mid-way point. Pt performed modified heel-slides with limited ROM 2 sets progressing to full ROM for 2 sets to fatigue with PT providing verbal cues for technique. Pt performed side-lying L UE elbow flexion/extension and shoulder flexion/extension x3 each to fatigue with hand roller to decrease friction. PT provided HHA assist to increase ROM and tactile cues for muscle activation. Pt performed scapular PNF pattern starting with rhythmic initiation progressing to light resistance for scapular depression with AAROM for elevation. Pt supine to sit mod-I with increased time. Pt donned L shoe and AFO independently. Pt stand pivot mat to w/c mod-I. PT wheeled pt to room for orthotics  consult. Pt given AFO for home and ambulated 45ft with new AFO and LBQC with supervision for safety with 1 major loss of anterior balance with min assist to correct. Pt left sitting in w/c with call bell in reach.  Therapy Documentation Precautions:  Precautions Precautions: Fall Precaution Comments: Peritoneal dialysis patient; L subluxed shoulder Restrictions Weight Bearing Restrictions: No   See Function Navigator for Current Functional Status.   Therapy/Group: Individual Therapy  Derick Seminara 01/28/2017, 5:48 PM

## 2017-01-28 NOTE — Progress Notes (Signed)
[facility] PH[facility]ICAL [facility]ICINE & REHABILITATION     PROGRESS NOTE  Subjective/Complaints:     ROS: Denies CP, SOB, N/V/D.  Objective: Vital Signs: Blood pressure 120/79, pulse 72, temperature 98.2 F (36.8 C), temperature source Oral, resp. rate 16, height 5\' 5"  (1.651 m), weight 48.7 kg (107 lb 5.8 oz), SpO2 98 %. No results found. No results for input(s): WBC, HGB, HCT, PLT in the last 72 hours. No results for input(s): NA, K, CL, GLUCOSE, BUN, CREATININE, CALCIUM in the last 72 hours.  Invalid input(s): CO CBG (last 3)   Recent Labs  01/26/17 0624  GLUCAP 108*    Wt Readings from Last 3 Encounters:  01/27/17 48.7 kg (107 lb 5.8 oz)  01/04/17 50.7 kg (111 lb 12.4 oz)  09/04/16 54.4 kg (120 lb)    Physical Exam:  BP 120/79 (BP Location: Left Arm)   Pulse 72   Temp 98.2 F (36.8 C) (Oral)   Resp 16   Ht 5\' 5"  (1.651 m)   Wt 48.7 kg (107 lb 5.8 oz)   SpO2 98%   BMI 17.87 kg/m  Constitutional: She appears well-developed. Frail  HENT: Normocephalic and atraumatic.  Eyes: EOMI. No discharge.  Cardiovascular: RRR. No JVD. Respiratory: CTA Bilaterally. Normal effort .  GI: Soft. Bowel sounds are normal.   Musculoskeletal: She exhibits no edema or tenderness.  Neurological: She is alert.  Fair awareness of deficits.  Motor: RUE/RLE: 5/5 LUE 0/5 except 2- triceps /LLE: 3-/5 left Hip/knee ext synergy, L foot drop No increase in tone Tone: [place] 1 in arm adductors o/w 0 in UE Skin: Skin is warm and dry.  Psychiatric: Her behavior is normal. Her affect is blunt. She does not engage much   Assessment/Plan: 1. Functional deficits secondary to right frontal hemorrhage, small SAH and signs of PRES which require 3+ hours per day of interdisciplinary therapy in a comprehensive inpatient rehab setting. Physiatrist is providing close team supervision and 24 hour management of active medical problems listed below. Physiatrist and rehab team continue to assess barriers to  discharge/monitor patient progress toward functional and medical goals.  Function:  Bathing Bathing position   Position: Shower  Bathing parts Body parts bathed by patient: Left arm, Chest, Abdomen, Front perineal area, Buttocks, Right upper leg, Left upper leg, Right lower leg, Left lower leg Body parts bathed by helper: Right arm, Back  Bathing assist Assist Level: Touching or steadying assistance(Pt > 75%)      Upper Body Dressing/Undressing Upper body dressing   What is the patient wearing?: Pull over shirt/dress Bra - Perfomed by patient: Thread/unthread right bra strap Bra - Perfomed by helper: Thread/unthread left bra strap, Hook/unhook bra (pull down sports bra) Pull over shirt/dress - Perfomed by patient: Thread/unthread right sleeve, Thread/unthread left sleeve, Put head through opening, Pull shirt over trunk Pull over shirt/dress - Perfomed by helper: Pull shirt over trunk        Upper body assist Assist Level: Supervision or verbal cues      Lower Body Dressing/Undressing Lower body dressing   What is the patient wearing?: Underwear, Pants, Socks, Shoes, AFO Underwear - Performed by patient: Thread/unthread right underwear leg, Thread/unthread left underwear leg, Pull underwear up/down Underwear - Performed by helper: Pull underwear up/down Pants- Performed by patient: Thread/unthread right pants leg, Thread/unthread left pants leg, Pull pants up/down Pants- Performed by helper: Pull pants up/down Non-skid slipper socks- Performed by patient: Don/doff right sock Non-skid slipper socks- Performed by helper: Don/doff left sock  Socks - Performed by patient: Don/doff right sock Socks - Performed by helper: Don/doff right sock Shoes - Performed by patient: Don/doff right shoe, Fasten right Shoes - Performed by helper: Don/doff left shoe, Fasten left   AFO - Performed by helper: Don/doff left AFO TED Hose - Performed by patient: Don/doff right TED hose TED Hose -  Performed by helper: Don/doff right TED hose, Don/doff left TED hose  Lower body assist Assist for lower body dressing: Touching or steadying assistance (Pt > 75%)      Toileting Toileting Toileting activity did not occur: No continent bowel/bladder event Toileting steps completed by patient: Adjust clothing prior to toileting, Performs perineal hygiene, Adjust clothing after toileting Toileting steps completed by helper: Adjust clothing prior to toileting, Adjust clothing after toileting Toileting Assistive Devices: Grab bar or rail, Other (comment) (hemiwalker)  Toileting assist Assist level: Touching or steadying assistance (Pt.75%)   Transfers Chair/bed transfer   Chair/bed transfer method: Stand pivot Chair/bed transfer assist level: No Help, no cues, assistive device, takes more than a reasonable amount of time Chair/bed transfer assistive device: Armrests     Locomotion Ambulation     Max distance: 173ft Assist level: Touching or steadying assistance (Pt > 75%)   Wheelchair   Type: Manual Max wheelchair distance: 356ft Assist Level: No help, No cues, assistive device, takes more than reasonable amount of time  Cognition Comprehension Comprehension assist level: Follows complex conversation/direction with no assist  Expression Expression assist level: Expresses complex ideas: With no assist  Social Interaction Social Interaction assist level: Interacts appropriately with others with medication or extra time (anti-anxiety, antidepressant).  Problem Solving Problem solving assist level: Solves complex problems: With extra time  Memory Memory assist level: Recognizes or recalls 90% of the time/requires cueing < 10% of the time    Medical Problem List and Plan: 1.  Decreased functional mobility secondary to right frontal hemorrhage, small SAH and signs of PRES  Cont CIR PT OT, SLP-PRAFO/WHO ,    Fluoxetine started 7/3, increased to 20 on 7/10  2.  DVT  Prophylaxis/Anticoagulation: SCDs.   Vascular study neg- no clinical sign DVT 3. Pain Management: Tylenol as needed 4. Mood: Provide emotional support, pt feeling more optimistic about recovery 5. Neuropsych: This patient is  fully capable of making decisions on her own behalf.per Neuropsych Eval 6. Skin/Wound Care: Routine skin checks 7. Fluids/Electrolytes/Nutrition: Routine I&Os Intake  8. Seizure prophylaxis. Keppra 500 mg every 12 hours 9. ESRD with history of right renal cancer status post nephrectomy. Continue peritoneal dialysis as per renal services 10. Hypertension. At home Norvasc 5 mg twice daily, Coreg 12.5 mg twice a day, Cozaar 50 mg daily, , amlodipine increased from 5- > 10mg  per Nephro on 7/11, Nephro also adjusting dialysate BP controlled 7/26   Vitals:   01/27/17 1834 01/28/17 0506  BP: 119/76 120/79  Pulse: 72 72  Resp: 18 16  Temp: 98.5 F (36.9 C) 98.2 F (36.8 C)   11. Hypothyroidism. Synthroid 12.Constipation. Laxative assistance, last recorded BM, senna S 1 BID- BM x 2 Cont 7/23 , will increase senna S to 2 tabs    LOS (Days) 24 A FACE TO FACE EVALUATION WAS PERFORMED  Terry Juarez 01/28/2017 6:43 AM

## 2017-01-28 NOTE — Progress Notes (Signed)
Occupational Therapy Session Note  Patient Details  Name: Terry Juarez MRN: [id number] Date of Birth: 07/06/1966  Today's Date: 01/28/2017 OT Individual Time: 2563-8937 OT Individual Time Calculation (min): 15 min    Short Term Goals: Week 3:  OT Short Term Goal 1 (Week 3): Pt will complete L UE self-ROM without assistance OT Short Term Goal 2 (Week 3): Pt will maintain standing balance with min A while pulling pants over hips during BADLs OT Short Term Goal 3 (Week 3): Pt will independently place LUE into functional position during self-care tasks to improve awareness of LUE/minimize UE injury  Skilled Therapeutic Interventions/Progress Updates:    Pt presented supine in bed, reporting she still needs to be removed from HD machine, with HD RN arriving to complete. Pt then completes supine to sitting EOB with supervision, stand pivot transfer EOB>w/c with close supervision to complete dressing ADLs. MinA for w/c navigation in room (for time management) for Pt to obtain clothing items from drawers. Pt completes UB dressing with good carryover of hemi technique. Dons underwear, pants with overall supervision, very brief periods of MinA to steady during standing to pull clothing over hips. Pt dons socks, shoes with setup. Ended session with Pt seated in w/c at sink preparing to complete grooming ADLs, next OT preparing to enter room for tx session.   Pt missed 15 min skilled OT treatment 2/2 HD RN removing Pt from HD machine.   Therapy Documentation Precautions:  Precautions Precautions: Fall Precaution Comments: Peritoneal dialysis patient; L subluxed shoulder Restrictions Weight Bearing Restrictions: No General: General OT Amount of Missed Time: 15 Minutes Vital Signs:   Pain: Pain Assessment Pain Assessment: No/denies pain ADL: ADL ADL Comments: refer to functional navigator  See Function Navigator for Current Functional Status.   Therapy/Group: Individual  Therapy  Raymondo Band 01/28/2017, 12:17 PM

## 2017-01-28 NOTE — Progress Notes (Signed)
Physical Therapy Session Note  Patient Details  Name: Terry Juarez MRN: [id number] Date of Birth: 07/11/1965  Today's Date: 01/28/2017 PT Individual Time: 1100-1200 PT Individual Time Calculation (min): 60 min   Short Term Goals: Week 3:  PT Short Term Goal 1 (Week 3): Pt will transfer consistently with supervision PT Short Term Goal 2 (Week 3): Pt will ambulated with LRAD 35ft with min assist PT Short Term Goal 3 (Week 3): Pt will ascend/descend 4steps with mod assist  PT Short Term Goal 4 (Week 3): Pt will demo static standing balance with R UE support with supervision PT Short Term Goal 5 (Week 3): Pt will perform car transfer with mod assist  Skilled Therapeutic Interventions/Progress Updates:    no c/o pain. PT treatment session focused on L LE NMR, standing balance, L LE strengthening, and ambulation with LRAD.  Pt sitting in w/c wearing GivMohr sling upon arrival, agreeable to PT treatment session. Pt still waiting on L LE AFO delivery and not wearing one at beginning of session. Pt performed w/c mobility 12ft room to gym mod-I. Pt set up transfer and performed stand pivot transfer w/c to mat mod-I. Pt played standing horseshoes x 5 without UE support and R LE on step to increase L LE weight bearing during standing balance all with min-mod assist for steadying. Pt performed R LE step up x3 sets to fatigue without UE support with mod assist for balance and for L knee blocking. Pt ambulated 33ft with hemiwalker with min assist for balance and no AFO with noticed decreased step length and toe clearance. Pt ambulated 31ft with hemiwalker with min assist wearing AFO with increased gait pattern. Pt ambulated 41ft with LBQC with min assist with same gait pattern and slight increase in gait speed. Pt prefers LBQC. Pt attempted ambulation NBQC with min assist for balance and increased difficulty for foot clearance and pt reporting feeling more unsteady. Pt ambulated 155ft with LBQC to room with  min assist for steadying. Pt left sitting in w/c with call bell in reach.   Therapy Documentation Precautions:  Precautions Precautions: Fall Precaution Comments: Peritoneal dialysis patient; L subluxed shoulder Restrictions Weight Bearing Restrictions: No   See Function Navigator for Current Functional Status.   Therapy/Group: Individual Therapy  Lynae Pederson 01/28/2017, 12:25 PM

## 2017-01-28 NOTE — Progress Notes (Signed)
Occupational Therapy Session Note  Patient Details  Name: Terry Juarez MRN: [id number] Date of Birth: Oct 08, 1965  Today's Date: 01/28/2017 OT Individual Time: 0900-1000 OT Individual Time Calculation (min): 60 min    Short Term Goals: Week 3:  OT Short Term Goal 1 (Week 3): Pt will complete L UE self-ROM without assistance OT Short Term Goal 2 (Week 3): Pt will maintain standing balance with min A while pulling pants over hips during BADLs OT Short Term Goal 3 (Week 3): Pt will independently place LUE into functional position during self-care tasks to improve awareness of LUE/minimize UE injury  Skilled Therapeutic Interventions/Progress Updates:    Pt seen this session for LUE/LLE NMR. Pt received in w/c and taken to gym. Donned givemohr sling for LUE support. She stood to parallel bars and worked on active knee extension with cues to flex her quads and glutes with a wt shift from left to R. Pt responded well and was able to actively elicit with min A for positioning. Worked on numerous repetitions for motor memory.  Lateral steps out and in with L foot with mod A for placement of foot. She ambulated with hemiwalker with min A to mat.  Place L hand on handle of hemiwalker with coban to maintain hand in place. Pt worked on A/arom of sh flex/extension with pushing arm forward and back. Pt has trace movement in both directions and tolerated numerous repetitions.  LUE on small physio ball so arm was placed just below shoulder height in abduction. Pt worked on a/arom of shoulder with rolling ball out and in, circles and forward and back. Discussed how she could use these exercises at home with a large playground ball.  Pt ambulated back to w/c and was taken back to her room.  Pt with all needs met.  Therapy Documentation Precautions:  Precautions Precautions: Fall Precaution Comments: Peritoneal dialysis patient; L subluxed shoulder Restrictions Weight Bearing Restrictions: No  lPain: Pain  Assessment Pain Assessment: No/denies pain  ADL: ADL ADL Comments: refer to functional navigator    See Function Navigator for Current Functional Status.   Therapy/Group: Individual Therapy  [place] 01/28/2017, 12:34 PM

## 2017-01-29 ENCOUNTER — Inpatient Hospital Stay (HOSPITAL_COMMUNITY): Payer: BLUE CROSS/BLUE SHIELD | Admitting: Occupational Therapy

## 2017-01-29 ENCOUNTER — Inpatient Hospital Stay (HOSPITAL_COMMUNITY): Payer: BLUE CROSS/BLUE SHIELD | Admitting: Physical Therapy

## 2017-01-29 NOTE — Progress Notes (Signed)
Apprx 1815, Physical Therapy noted that pt was on the floor, she was trying to get her shoe and noted she did not hit her head. VS taken and documented, notified PA and left message. Pt denies pain. There was a safety zone put in by Physical therapy.

## 2017-01-29 NOTE — Progress Notes (Signed)
[facility] PHYSICAL MEDICINE & REHABILITATION     PROGRESS NOTE  Subjective/Complaints:   Had BM without supp, good intake on flowsheet Pt aware of need for toileting assist  ROS: Denies CP, SOB, N/V/D.  Objective: Vital Signs: Blood pressure 119/72, pulse 70, temperature 98.6 F (37 C), temperature source Oral, resp. rate 18, height 5\' 5"  (1.651 m), weight 48.8 kg (107 lb 9.4 oz), SpO2 98 %. No results found. No results for input(s): WBC, HGB, HCT, PLT in the last 72 hours. No results for input(s): NA, K, CL, GLUCOSE, BUN, CREATININE, CALCIUM in the last 72 hours.  Invalid input(s): CO CBG (last 3)  No results for input(s): GLUCAP in the last 72 hours.  Wt Readings from Last 3 Encounters:  01/28/17 48.8 kg (107 lb 9.4 oz)  01/04/17 50.7 kg (111 lb 12.4 oz)  09/04/16 54.4 kg (120 lb)    Physical Exam:  BP 119/72 (BP Location: Left Arm)   Pulse 70   Temp 98.6 F (37 C) (Oral)   Resp 18   Ht 5\' 5"  (1.651 m)   Wt 48.8 kg (107 lb 9.4 oz)   SpO2 98%   BMI 17.90 kg/m  Constitutional: She appears well-developed. Frail  HENT: Normocephalic and atraumatic.  Eyes: EOMI. No discharge.  Cardiovascular: RRR. No JVD. Respiratory: CTA Bilaterally. Normal effort .  GI: Soft. Bowel sounds are normal.   Musculoskeletal: She exhibits no edema or tenderness.  Neurological: She is alert.  Fair awareness of deficits.  Motor: RUE/RLE: 5/5 LUE 0/5 except 2- triceps /LLE: 3-/5 left Hip/knee ext synergy, L foot drop No increase in tone Tone: [place] 1 in arm adductors o/w 0 in UE Skin: Skin is warm and dry.  Psychiatric: Her behavior is normal. Her affect is blunt. She does not engage much   Assessment/Plan: 1. Functional deficits secondary to right frontal hemorrhage, small SAH and signs of PRES which require 3+ hours per day of interdisciplinary therapy in a comprehensive inpatient rehab setting. Physiatrist is providing close team supervision and 24 hour management of active  medical problems listed below. Physiatrist and rehab team continue to assess barriers to discharge/monitor patient progress toward functional and medical goals.  Function:  Bathing Bathing position   Position: Shower  Bathing parts Body parts bathed by patient: Left arm, Chest, Abdomen, Front perineal area, Buttocks, Right upper leg, Left upper leg, Right lower leg, Left lower leg Body parts bathed by helper: Right arm, Back  Bathing assist Assist Level: Touching or steadying assistance(Pt > 75%)      Upper Body Dressing/Undressing Upper body dressing   What is the patient wearing?: Pull over shirt/dress Bra - Perfomed by patient: Thread/unthread right bra strap Bra - Perfomed by helper: Thread/unthread left bra strap, Hook/unhook bra (pull down sports bra) Pull over shirt/dress - Perfomed by patient: Thread/unthread right sleeve, Thread/unthread left sleeve, Put head through opening, Pull shirt over trunk Pull over shirt/dress - Perfomed by helper: Pull shirt over trunk        Upper body assist Assist Level: Set up   Set up : To obtain clothing/put away  Lower Body Dressing/Undressing Lower body dressing   What is the patient wearing?: Underwear, Pants, Socks, Shoes Underwear - Performed by patient: Thread/unthread right underwear leg, Thread/unthread left underwear leg, Pull underwear up/down Underwear - Performed by helper: Pull underwear up/down Pants- Performed by patient: Thread/unthread right pants leg, Thread/unthread left pants leg, Pull pants up/down Pants- Performed by helper: Pull pants up/down Non-skid slipper socks-  Performed by patient: Don/doff right sock Non-skid slipper socks- Performed by helper: Don/doff left sock Socks - Performed by patient: Don/doff right sock, Don/doff left sock Socks - Performed by helper: Don/doff right sock Shoes - Performed by patient: Don/doff right shoe, Don/doff left shoe, Fasten right, Fasten left Shoes - Performed by helper:  Don/doff left shoe, Fasten left   AFO - Performed by helper: Don/doff left AFO TED Hose - Performed by patient: Don/doff right TED hose TED Hose - Performed by helper: Don/doff right TED hose, Don/doff left TED hose  Lower body assist Assist for lower body dressing: Touching or steadying assistance (Pt > 75%)      Toileting Toileting Toileting activity did not occur: No continent bowel/bladder event Toileting steps completed by patient: Adjust clothing prior to toileting, Performs perineal hygiene, Adjust clothing after toileting Toileting steps completed by helper: Adjust clothing after toileting Toileting Assistive Devices: Grab bar or rail  Toileting assist Assist level: Touching or steadying assistance (Pt.75%)   Transfers Chair/bed transfer   Chair/bed transfer method: Stand pivot Chair/bed transfer assist level: No Help, no cues, assistive device, takes more than a reasonable amount of time Chair/bed transfer assistive device: Armrests     Locomotion Ambulation     Max distance: 184ft Assist level: Touching or steadying assistance (Pt > 75%)   Wheelchair   Type: Manual Max wheelchair distance: 171ft Assist Level: No help, No cues, assistive device, takes more than reasonable amount of time  Cognition Comprehension Comprehension assist level: Follows complex conversation/direction with no assist  Expression Expression assist level: Expresses complex ideas: With extra time/assistive device  Social Interaction Social Interaction assist level: Interacts appropriately with others with medication or extra time (anti-anxiety, antidepressant).  Problem Solving Problem solving assist level: Solves complex problems: With extra time  Memory Memory assist level: Recognizes or recalls 90% of the time/requires cueing < 10% of the time    Medical Problem List and Plan: 1.  Decreased functional mobility secondary to right frontal hemorrhage, small SAH and signs of PRES  Cont CIR PT  OT, SLP-PRAFO/WHO , Tent D/C 7/30, mod I in room except for toileting   Fluoxetine started 7/3, increased to 20 on 7/10  2.  DVT Prophylaxis/Anticoagulation: SCDs.   Vascular study neg- no clinical sign DVT 3. Pain Management: Tylenol as needed 4. Mood: Provide emotional support, pt feeling more optimistic about recovery 5. Neuropsych: This patient is  fully capable of making decisions on her own behalf.per Neuropsych Eval 6. Skin/Wound Care: Routine skin checks 7. Fluids/Electrolytes/Nutrition: Good fluid and fair meal intake 8. Seizure prophylaxis. Keppra 500 mg every 12 hours 9. ESRD with history of right renal cancer status post nephrectomy. Continue peritoneal dialysis as per renal services 10. Hypertension. At home Norvasc 5 mg twice daily, Coreg 12.5 mg twice a day, Cozaar 50 mg daily, , amlodipine increased from 5- > 10mg  per Nephro on 7/11, Nephro also adjusting dialysate BP controlled 7/27   Vitals:   01/28/17 1430 01/29/17 0510  BP: 106/64 119/72  Pulse: 70   Resp: 17 18  Temp: 98.5 F (36.9 C) 98.6 F (37 C)   11. Hypothyroidism. Synthroid 12.Constipation. Laxative assistance, last recorded BM, senna S 1 BID- BM x 1Cont 7/27 , Cont senna S to 2 tabs    LOS (Days) 25 A FACE TO FACE EVALUATION WAS PERFORMED  Charlett Blake 01/29/2017 6:52 AM

## 2017-01-29 NOTE — Progress Notes (Signed)
Received report from day RN about patient's fall at 1815. Called emergency contact listed, Mr. Julian Askin, at approximately 10:00 pm to let him know what happened. Also called Dr. Posey Pronto to notify of the incident. Will continue to monitor patient.

## 2017-01-29 NOTE — Progress Notes (Signed)
Physical Therapy Session Note  Patient Details  Name: Terry Juarez MRN: [id number] Date of Birth: 01-03-1966  Today's Date: 01/29/2017 PT Individual Time: 0900-0945 PT Individual Time Calculation (min): 45 min   Short Term Goals: Week 3:  PT Short Term Goal 1 (Week 3): Pt will transfer consistently with supervision PT Short Term Goal 2 (Week 3): Pt will ambulated with LRAD 49ft with min assist PT Short Term Goal 3 (Week 3): Pt will ascend/descend 4steps with mod assist  PT Short Term Goal 4 (Week 3): Pt will demo static standing balance with R UE support with supervision PT Short Term Goal 5 (Week 3): Pt will perform car transfer with mod assist  Skilled Therapeutic Interventions/Progress Updates:    no c/o pain. PT treatment session focused on standing balance with dressing and NMR of the L LE during standing activities.  Pt sitting in w/c at sink starting dressing upon arrival, pt agreeable to PT treatment session. Pt gathered all clothing mod-I from w/c and performed UB dressing of bra and shirt with min assist for time. Pt performed LB dressing in sitting to standing with min guard while standing to finish donning pants. Pt donned R shoe mod-I in w/c and PT donned L shoe and AFO for time. Pt ambulated 141ft with LBQC to gym with close supervision and 2 instances of L lateral LOB with min assist to correct. Pt performed x2 to fatigue of R LE step up on 1 inch step with min assist for balance with improvement noticed in balance since last treatment. Pt performed sit to stands x12 with mirror feedback to maintain midline by weight shifting to L with min guard assist for steadying and verbal and tactile cues for L weight shift. Pt L LE step up on 1inch stair x2 to fatigue with R UE support on [facility] and min assist for balance with tactile and verbal cues for L knee extension. Pt ambulated 123ft to room with [facility] with close supervision progressing to min assist due to pt fatigue and 3 instances of  L lateral LOB with min assist to correct. Pt left sitting in recliner with cal bell in reach and w/c in position for mod-I transfer when needed.   Therapy Documentation Precautions:  Precautions Precautions: Fall Precaution Comments: Peritoneal dialysis patient; L subluxed shoulder Restrictions Weight Bearing Restrictions: No   See Function Navigator for Current Functional Status.   Therapy/Group: Individual Therapy  Inaki Vantine 01/29/2017, 11:14 AM

## 2017-01-29 NOTE — Progress Notes (Signed)
Physical Therapy Session Note  Patient Details  Name: Terry Juarez MRN: [id number] Date of Birth: 07/31/1965  Today's Date: 01/29/2017 PT Individual Time: 1615-1700 PT Individual Time Calculation (min): 45 min   Short Term Goals: Week 1:  PT Short Term Goal 1 (Week 1): Pt will transfer from bed to w/c with moderate assist in either direction  PT Short Term Goal 1 - Progress (Week 1): Met PT Short Term Goal 2 (Week 1): Pt will perform all bed mobility with min assist PT Short Term Goal 2 - Progress (Week 1): Met PT Short Term Goal 3 (Week 1): Pt will demonstrate dynamic standing balance for functional tasks requiring moderate assistance PT Short Term Goal 3 - Progress (Week 1): Not met PT Short Term Goal 4 (Week 1): Pt will propel w/c 50 ft in a controlled environment with supervision  PT Short Term Goal 4 - Progress (Week 1): Met Week 2:  PT Short Term Goal 1 (Week 2): Pt will ambulate with LRAD x50' with max assist PT Short Term Goal 1 - Progress (Week 2): Met PT Short Term Goal 2 (Week 2): Pt will transfer with consistent min assist PT Short Term Goal 2 - Progress (Week 2): Met PT Short Term Goal 3 (Week 2): Pt will initiate stair training with PT PT Short Term Goal 3 - Progress (Week 2): Met PT Short Term Goal 4 (Week 2): Pt will demo static standing balance with RUE support with min assist  PT Short Term Goal 4 - Progress (Week 2): Met Week 3:  PT Short Term Goal 1 (Week 3): Pt will transfer consistently with supervision PT Short Term Goal 2 (Week 3): Pt will ambulated with LRAD 39f with min assist PT Short Term Goal 3 (Week 3): Pt will ascend/descend 4steps with mod assist  PT Short Term Goal 4 (Week 3): Pt will demo static standing balance with R UE support with supervision PT Short Term Goal 5 (Week 3): Pt will perform car transfer with mod assist  Skilled Therapeutic Interventions/Progress Updates:   Pt in w/c upon arrival and agreeable to therapy, no c/o pain.   NMR w/  dynamic standing balance, emphasis on L quad activation and control, close supervision and no UE support.   Step up, 3" step w/ Mod A to prevent LOB, no UE support. Verbal cues for activation of L quad during step up/down.   NuStep @ L3 10 min to work on reciprocal movement pattern and functional LE strength. Pt still requires adductor assist while using NuStep, however has increased L adductor/internal rotation activation to keep L foot on foot plate.   Pt ambulated 30', 40' w/ touching/steady assist w/ quad cane. Verbal cues for gait pattern - to increase L quad activation in stance, to increase L step length, and to decrease L hip ER throughout gait cycle.   Ended session in recliner, call bell within reach and all needs met.   Therapy Documentation Precautions:  Precautions Precautions: Fall Precaution Comments: Peritoneal dialysis patient; L subluxed shoulder Restrictions Weight Bearing Restrictions: No Vital Signs: Therapy Vitals Temp: 97.7 F (36.5 C) Temp Source: Oral Pulse Rate: 97 Resp: 16 BP: 117/77 Patient Position (if appropriate): Sitting Oxygen Therapy SpO2: 98 % O2 Device: Not Delivered  See Function Navigator for Current Functional Status.   Therapy/Group: Individual Therapy  Jaia Alonge K Arnette 01/29/2017, 4:34 PM

## 2017-01-29 NOTE — Progress Notes (Signed)
[facility] Progress Note  Subjective: doing well, no new c/o  Vitals:   01/28/17 0900 01/28/17 1430 01/29/17 0510 01/29/17 0819  BP:  106/64 119/72 123/69  Pulse:  70  72  Resp:  17 18   Temp:  98.5 F (36.9 C) 98.6 F (37 C)   TempSrc:  Oral Oral   SpO2:  98% 98%   Weight: 48.8 kg (107 lb 9.4 oz)     Height:        Inpatient medications: . amLODipine  10 mg Oral Daily  . calcium acetate  1,334 mg Oral TID WC  . carvedilol  12.5 mg Oral BID WC  . famotidine  20 mg Oral Daily  . FLUoxetine  20 mg Oral Daily  . gentamicin cream  1 application Topical Daily  . levETIRAcetam  500 mg Oral BID  . levothyroxine  88 mcg Oral QAC breakfast  . multivitamin  1 tablet Oral Daily  . pantoprazole  40 mg Oral BID  . polyethylene glycol  17 g Oral Daily  . senna-docusate  3 tablet Oral BID   . dialysis solution 1.5% low-MG/low-CA     acetaminophen, bisacodyl, heparin, sorbitol  Exam: General: slender NAD HEENT: MMM NECK: no JVD CV Regular S1S2 No S3 PULM Lungs clear ABD: soft NT EXTNo LE edema NEURO Left hemiparesis, some improvement ACCESS: PD cath with clear dry dressing R abdomen; Right upper arm AVF aneurysmal/+ bruit   Dialysis:PD 5 exchanges overnight, no day bag, no pause, 2000 volume, using all 1.5%- UF of 1.5 liters   Summary: Pt is a 51 y.o.yo femalewith ESRD on PD who was admitted on 6/16/2018with seizures and L hemiparesis. Found to have right sided frontal lobe hemorrhage and PRES.  Had been transitioned from PD--> HD but had cardiac arrest 12/25/16 in inpatient HD.  Back on PD now.  Transferred to Rehab 01/04/17.  Assessment/ Plan: 1. CVA with R frontal SAH and PRES: Pt has L hemiparesis.  In CIR. Making progress 2. Seizures: controlled on Keppra. 3. ESRD: Continue CCPD. Use all 1.5%. Family in [place] are planning to take her after dc for 6 mos. They are going to train in [place] at the Home Training unit Mon- Friday next week. Then  take Ms Marchant down state to live w/ them for ~ 6 mos outside of [place], [place]. Have d/w Mr Angelmarie Ponzo who is anticipating dc Monday and will be here around 10 am to pick up the patient and take her to PD training.  4. HTN/volume: stable.  EDW appears to be around 49 kg.  On Coreg and amlodipine. 5. Cardiac arrest: had been transitioned to HD in setting of L hemiparesis; cardiac arrest occurred while on HD 6/22. Refuses refused offer to resume HD again.  6. Anemia: history of GIB.  Responded toowell to ESA and and Hb now >13. Holding ESA 7. MBD: No sensipar, on Phoslo, Phos 6.0 so will increase to 2 caps TID and follow--> Ca inching upwards, may need to choose non-calcium based binder if has hypercalcemia again. 8. Nutrition: Alb 3.0, slowly improving; regular diet, Nepro, vits  9. Dispo: see above   [name] [facility] pager [phone]   01/29/2017, 11:56 AM    Recent Labs Lab 01/24/17 0630  NA 137  K 4.1  CL 97*  CO2 23  GLUCOSE 100*  BUN 56*  CREATININE 15.50*  CALCIUM 9.1  PHOS 6.0*    Recent Labs Lab 01/24/17 0630  ALBUMIN  3.0*    Recent Labs Lab 01/24/17 0630  WBC 6.9  NEUTROABS 4.6  HGB 13.1  HCT 40.6  MCV 96.9  PLT 295   Iron/TIBC/Ferritin/ %Sat No results found for: IRON, TIBC, FERRITIN, IRONPCTSAT

## 2017-01-29 NOTE — Progress Notes (Signed)
Occupational Therapy Weekly Progress Note  Patient Details  Name: Terry Juarez MRN: [id number] Date of Birth: [birth year]  Beginning of progress report period: January 05, 2017 End of progress report period: January 29, 2017  Today's Date: 01/29/2017 OT Individual Time: 1000-1045 OT Individual Time Calculation (min): 45 min   Patient has met 3 of 3 short term goals. Pt is making great progress with OT. She has progressed to overall supervision level for all ADLs and functional transfers and is progressing towards mod I.  L UE function is progressing slowly, but pt has demonstrated good understanding of self-ROM technqiues and improved awareness. Pt is on target for dc home by the beginning of next week,  Patient continues to demonstrate the following deficits: muscle weakness, abnormal tone and unbalanced muscle activation, and decreased standing balance, hemiplegia and decreased balance strategies and therefore will continue to benefit from skilled OT intervention to enhance overall performance with BADL.  Patient progressing toward long term goals..  Continue plan of care.  OT Short Term Goals Week 3:  OT Short Term Goal 1 (Week 3): Pt will complete L UE self-ROM without assistance OT Short Term Goal 1 - Progress (Week 3): Met OT Short Term Goal 2 (Week 3): Pt will maintain standing balance with min A while pulling pants over hips during BADLs OT Short Term Goal 3 (Week 3): Pt will independently place LUE into functional position during self-care tasks to improve awareness of LUE/minimize UE injury OT Short Term Goal 3 - Progress (Week 3): Met Week 4:  OT Short Term Goal 1 (Week 4): LTG=STG 2/2 ELOS  Skilled Therapeutic Interventions/Progress Updates:   OT treatment session focused on L NMR, standing balance, dc planning, and NMES. Pt reports she feels comfortable with plan to utilize drop-arm [facility] in walk-in shower as shower chair. Pt completed stand-pivot transfer R and L Mod I. 1:1 NMES  applied to  wrist extensorssupraspinatus and middle deltoid and to help approximate shoulder joint to reduce sublux and reduce pain.   Ratio 1:1 Rate 35 pps Waveform- Asymmetric Ramp 1.0 Pulse 300 Intensity- 26 Duration -  15 mins  Report of pain at the beginning of session 0 Report of pain at the end of session 0  No adverse reactions after treatment and is skin intact.   While NMES is on, pt worked on standing balance and NMR standing at raised therapy mat, weight bearing through L UE for towel pushes. Close supervision and intermittent min guard A for lateral LOB. Pt returned to room at end of session and left seated in recliner with needs met.    Therapy Documentation Precautions:  Precautions Precautions: Fall Precaution Comments: Peritoneal dialysis patient; L subluxed shoulder Restrictions Weight Bearing Restrictions: No Pain:  none/denies pain ADL: ADL ADL Comments: refer to functional navigator  See Function Navigator for Current Functional Status.   Therapy/Group: Individual Therapy  [name] 01/29/2017, 6:51 AM

## 2017-01-29 NOTE — Progress Notes (Signed)
Occupational Therapy Session Note  Patient Details  Name: Terry Juarez MRN: [id number] Date of Birth: 03/18/1966  Today's Date: 01/29/2017 OT Individual Time: 1500-1600  OT Individual Time Calculation (min): 60 min    Short Term Goals: Week 1:  OT Short Term Goal 1 (Week 1): Pt will be able to don a shirt with min A. OT Short Term Goal 1 - Progress (Week 1): Met OT Short Term Goal 2 (Week 1): Pt will demonstrate improved sitting balance by sitting statically on toilet with close S. OT Short Term Goal 2 - Progress (Week 1): Met OT Short Term Goal 3 (Week 1): Pt will complete squat pivot transfers on/off the toilet with min -mod A. OT Short Term Goal 3 - Progress (Week 1): Met OT Short Term Goal 4 (Week 1): Pt will demonstrate proficiency with self ROM of LUE with min cues or less. OT Short Term Goal 4 - Progress (Week 1): Met OT Short Term Goal 5 (Week 1): Pt will demonstrate improved LUE body awareness by using R hand to position L arm prior to transfers. OT Short Term Goal 5 - Progress (Week 1): Met Week 2:  OT Short Term Goal 1 (Week 2): Pt will complete toilet transfers with consistent min A OT Short Term Goal 1 - Progress (Week 2): Met OT Short Term Goal 2 (Week 2): Pt will utilize L UE as a stabilizer with min questioning cues OT Short Term Goal 2 - Progress (Week 2): Met OT Short Term Goal 3 (Week 2): Pt will complete LB dressing with consistent min A OT Short Term Goal 3 - Progress (Week 2): Met Week 3:  OT Short Term Goal 1 (Week 3): Pt will complete L UE self-ROM without assistance OT Short Term Goal 1 - Progress (Week 3): Met OT Short Term Goal 2 (Week 3): Pt will maintain standing balance with min A while pulling pants over hips during BADLs OT Short Term Goal 3 (Week 3): Pt will independently place LUE into functional position during self-care tasks to improve awareness of LUE/minimize UE injury OT Short Term Goal 3 - Progress (Week 3): Met  Skilled Therapeutic  Interventions/Progress Updates:    Focus of treatment:  NMRE, functional mobility, Standing balance with turns.   Pt's brother in room with pt.  Pt ambulated to gym with quad cane and min assist.  Pt had difficulty with turns and needed increased help for balance.  Performed UE NMRE and WB with manual techniques using bench and tactile imput.  Provided input to scapula for depression and adduction.  Practiced shoulder flexion and extension with pt providing 25 % assist.  Pt able to maintain upright posture with good alignment in [facility] joint with no subluxation palpated.  Educated pt on postural control and importance of proximal stability with better distal control.  Pt ambulated back to room with 1 rest break and min assist for balance and heel toe stride.   Pt pleased with session.      Therapy Documentation Precautions:  Precautions Precautions: Fall Precaution Comments: Peritoneal dialysis patient; L subluxed shoulder Restrictions Weight Bearing Restrictions: No General:   Vital Signs: Therapy Vitals Temp: 97.7 F (36.5 C) Temp Source: Oral Pulse Rate: 97 Resp: 16 BP: 117/77 Patient Position (if appropriate): Sitting Oxygen Therapy SpO2: 98 % O2 Device: Not Delivered Pain:  none     Therapy/Group: Individual Therapy  Lisa Roca 01/29/2017, 3:01 PM

## 2017-01-30 DIAGNOSIS — W19XXXA Unspecified fall, initial encounter: Secondary | ICD-10-CM

## 2017-01-30 DIAGNOSIS — Z298 Encounter for other specified prophylactic measures: Secondary | ICD-10-CM

## 2017-01-30 NOTE — Progress Notes (Signed)
[place] PHYSICAL MEDICINE & REHABILITATION     PROGRESS NOTE  Subjective/Complaints:  Pt seen laying in bed this AM.  She slept well overnight. Informed by nursing overnight the patient had a fall. Labs reviewed.  ROS: Denies CP, SOB, N/V/D.  Objective: Vital Signs: Blood pressure 132/82, pulse 67, temperature 98.1 F (36.7 C), temperature source Oral, resp. rate 18, height 5\' 5"  (1.651 m), weight 48.7 kg (107 lb 5.8 oz), SpO2 97 %. No results found. No results for input(s): WBC, HGB, HCT, PLT in the last 72 hours. No results for input(s): NA, K, CL, GLUCOSE, BUN, CREATININE, CALCIUM in the last 72 hours.  Invalid input(s): CO CBG (last 3)  No results for input(s): GLUCAP in the last 72 hours.  Wt Readings from Last 3 Encounters:  01/29/17 48.7 kg (107 lb 5.8 oz)  01/04/17 50.7 kg (111 lb 12.4 oz)  09/04/16 54.4 kg (120 lb)    Physical Exam:  BP 132/82 (BP Location: Left Arm)   Pulse 67   Temp 98.1 F (36.7 C) (Oral)   Resp 18   Ht 5\' 5"  (1.651 m)   Wt 48.7 kg (107 lb 5.8 oz)   SpO2 97%   BMI 17.87 kg/m  Constitutional: She appears well-developed. Frail  HENT: Normocephalic and atraumatic.  Eyes: EOMI. No discharge.  Cardiovascular: RRR. No JVD. Respiratory: CTA Bilaterally. Normal effort.  GI: Soft. Bowel sounds are normal.   Musculoskeletal: She exhibits no edema or tenderness.  Neurological: She is alert.  Fair awareness of deficits.  Motor: RUE/RLE: 5/5 LUE 0/5  LLE: 2/5 Hip/knee ext synergy, ADF/PF 0/5 Tone: [place] 1 in arm adductors Skin: Skin is warm and dry.  Psychiatric: Her behavior is normal. Her affect is blunt. She does not engage much   Assessment/Plan: 1. Functional deficits secondary to right frontal hemorrhage, small SAH and signs of PRES which require 3+ hours per day of interdisciplinary therapy in a comprehensive inpatient rehab setting. Physiatrist is providing close team supervision and 24 hour management of active medical problems  listed below. Physiatrist and rehab team continue to assess barriers to discharge/monitor patient progress toward functional and medical goals.  Function:  Bathing Bathing position   Position: Shower  Bathing parts Body parts bathed by patient: Right arm, Left arm, Chest, Abdomen, Front perineal area, Buttocks, Right upper leg, Left lower leg, Right lower leg, Left upper leg Body parts bathed by helper: Right arm, Back  Bathing assist Assist Level: Supervision or verbal cues      Upper Body Dressing/Undressing Upper body dressing   What is the patient wearing?: Pull over shirt/dress Bra - Perfomed by patient: Thread/unthread right bra strap Bra - Perfomed by helper: Thread/unthread left bra strap, Hook/unhook bra (pull down sports bra) Pull over shirt/dress - Perfomed by patient: Put head through opening, Thread/unthread right sleeve, Thread/unthread left sleeve, Pull shirt over trunk Pull over shirt/dress - Perfomed by helper: Pull shirt over trunk        Upper body assist Assist Level: More than reasonable time   Set up : To obtain clothing/put away  Lower Body Dressing/Undressing Lower body dressing   What is the patient wearing?: Underwear, Pants, Socks, Shoes, AFO Underwear - Performed by patient: Thread/unthread right underwear leg, Thread/unthread left underwear leg, Pull underwear up/down Underwear - Performed by helper: Pull underwear up/down Pants- Performed by patient: Thread/unthread right pants leg, Fasten/unfasten pants, Pull pants up/down, Thread/unthread left pants leg Pants- Performed by helper: Pull pants up/down Non-skid slipper socks- Performed  by patient: Don/doff right sock Non-skid slipper socks- Performed by helper: Don/doff left sock Socks - Performed by patient: Don/doff left sock, Don/doff right sock Socks - Performed by helper: Don/doff right sock Shoes - Performed by patient: Don/doff right shoe, Don/doff left shoe, Fasten right, Fasten left Shoes -  Performed by helper: Don/doff left shoe, Fasten left AFO - Performed by patient: Don/doff left AFO AFO - Performed by helper: Don/doff left AFO TED Hose - Performed by patient: Don/doff right TED hose TED Hose - Performed by helper: Don/doff right TED hose, Don/doff left TED hose  Lower body assist Assist for lower body dressing: Assistive device, More than reasonable time      Naval architect activity did not occur: No continent bowel/bladder event Toileting steps completed by patient: Adjust clothing prior to toileting, Performs perineal hygiene, Adjust clothing after toileting Toileting steps completed by helper: Adjust clothing after toileting Toileting Assistive Devices: Grab bar or rail  Toileting assist Assist level: Touching or steadying assistance (Pt.75%)   Transfers Chair/bed transfer   Chair/bed transfer method: Ambulatory Chair/bed transfer assist level: No Help, no cues, assistive device, takes more than a reasonable amount of time Chair/bed transfer assistive device: Armrests, Librarian, academic     Max distance: 40' Assist level: Touching or steadying assistance (Pt > 75%)   Wheelchair   Type: Manual Max wheelchair distance: 195ft Assist Level: No help, No cues, assistive device, takes more than reasonable amount of time  Cognition Comprehension Comprehension assist level: Follows complex conversation/direction with no assist  Expression Expression assist level: Expresses complex ideas: With extra time/assistive device  Social Interaction Social Interaction assist level: Interacts appropriately with others with medication or extra time (anti-anxiety, antidepressant).  Problem Solving Problem solving assist level: Solves complex problems: With extra time  Memory Memory assist level: Recognizes or recalls 75 - 89% of the time/requires cueing 10 - 24% of the time    Medical Problem List and Plan: 1.  Decreased functional mobility  secondary to right frontal hemorrhage, small SAH and signs of PRES  Cont CIR   PRAFO/WHO  Fluoxetine started 7/3, increased to 20 on 7/10 2.  DVT Prophylaxis/Anticoagulation: SCDs.   Vascular study neg- no clinical sign DVT 3. Pain Management: Tylenol as needed 4. Mood: Provide emotional support, pt feeling more optimistic about recovery 5. Neuropsych: This patient is  fully capable of making decisions on her own behalf.per Neuropsych Eval 6. Skin/Wound Care: Routine skin checks 7. Fluids/Electrolytes/Nutrition:  8. Seizure prophylaxis. Keppra 500 mg every 12 hours 9. ESRD with history of right renal cancer status post nephrectomy. Continue peritoneal dialysis as per renal services 10. Hypertension. At home Norvasc 5 mg twice daily, Coreg 12.5 mg twice a day, Cozaar 50 mg daily, , amlodipine increased from 5- > 10mg  per Nephro on 7/11, Nephro also adjusting dialysate  Controlled 7/28   Vitals:   01/30/17 0100 01/30/17 0516  BP: 127/81 132/82  Pulse: 67 67  Resp: 18 18  Temp: 98.3 F (36.8 C) 98.1 F (36.7 C)   11. Hypothyroidism. Synthroid 12.Constipation. Laxative assistance, last recorded BM, senna S   Improving 13. Fall  Cont to monitor      LOS (Days) 26 A FACE TO FACE EVALUATION WAS PERFORMED  Ankit [name] 01/30/2017 7:10 AM

## 2017-01-30 NOTE — Progress Notes (Signed)
Patient Details  Name: Terry Juarez MRN: [id number] Date of Birth: 04/25/1966   01/29/2017  Pt found on Floor at approximately  1715 on 01/29/2017. This PT assisted pt to return to [place] with Mod assist. Pt reports no pain, no LOC, and that she did not hit her head. PT notes no signs of visible Bruising or bleeding. Pt reports that she was trying to walk to the sink to gather personal Item and fell on her way back to the bed because her shoe came off. She also states that she knows that she is not supposed to be walking without assistance from Staff member.  RN and PA notified of incident.   [name] 01/30/2017, 5:14 AM

## 2017-01-31 ENCOUNTER — Ambulatory Visit (HOSPITAL_COMMUNITY): Payer: BLUE CROSS/BLUE SHIELD | Admitting: Physical Therapy

## 2017-01-31 ENCOUNTER — Encounter (HOSPITAL_COMMUNITY): Payer: BLUE CROSS/BLUE SHIELD

## 2017-01-31 DIAGNOSIS — G8114 Spastic hemiplegia affecting left nondominant side: Secondary | ICD-10-CM

## 2017-01-31 DIAGNOSIS — G8194 Hemiplegia, unspecified affecting left nondominant side: Secondary | ICD-10-CM

## 2017-01-31 NOTE — Progress Notes (Signed)
Physical Therapy Discharge Summary  Patient Details  Name: Terry Juarez MRN: [id number] Date of Birth: 08-23-1965   Patient has met 9 of 9 long term goals due to improved activity tolerance, improved balance, improved postural control, increased strength, ability to compensate for deficits and functional use of  left lower extremity.  Patient to discharge at a wheelchair level Modified Independent.   Patient's care partner is independent to provide the necessary physical and supervision assistance at discharge.  Reasons goals not met: n/a  Recommendation:  Patient will benefit from ongoing skilled PT services in home health setting to continue to advance safe functional mobility, address ongoing impairments in balance, coordination, and activity tolerance, and minimize fall risk.  Equipment: 16x16 w/c with half lap tray and quad cane  Reasons for discharge: treatment goals met  Patient/family agrees with progress made and goals achieved: Yes  PT Discharge Precautions/Restrictions Precautions Precautions: Fall Precaution Comments: Peritoneal dialysis patient; L subluxed shoulder Restrictions Weight Bearing Restrictions: No Pain Pain Assessment Pain Assessment: No/denies pain Vision/Perception  Vision - Assessment Eye Alignment: Within Functional Limits Ocular Range of Motion: Within Functional Limits Alignment/Gaze Preference: Within Defined Limits Perception Inattention/Neglect:  (mild L inattention) Praxis Praxis: Intact  Cognition Overall Cognitive Status: Within Functional Limits for tasks assessed Arousal/Alertness: Awake/alert Orientation Level: Oriented X4 Selective Attention: Appears intact Selective Attention Impairment: Verbal basic;Functional basic Memory: Appears intact Awareness: Appears intact Sensation Sensation Light Touch: Appears Intact Stereognosis: Appears Intact Hot/Cold: Appears Intact Proprioception: Appears Intact Coordination Gross  Motor Movements are Fluid and Coordinated: No Fine Motor Movements are Fluid and Coordinated: No Coordination and Movement Description: Lt hemiparesis Motor  Motor Motor: Hemiplegia   Trunk/Postural Assessment  Cervical Assessment Cervical Assessment: Within Functional Limits Thoracic Assessment Thoracic Assessment: Within Functional Limits Lumbar Assessment Lumbar Assessment: Within Functional Limits Postural Control Postural Control: Deficits on evaluation (delayed/absent on L) Trunk Control: decreased trunk control with increased lateral lean  Postural Limitations: decreased midline posture  Balance Static Sitting Balance Static Sitting - Level of Assistance: 6: Modified independent (Device/Increase time) Dynamic Sitting Balance Dynamic Sitting - Level of Assistance: 6: Modified independent (Device/Increase time) Static Standing Balance Static Standing - Level of Assistance: 5: Stand by assistance Dynamic Standing Balance Dynamic Standing - Level of Assistance: 4: Min assist Extremity Assessment  RUE Assessment RUE Assessment: Within Functional Limits LUE Assessment LUE Assessment: Exceptions to [facility] (subluxation) RLE Assessment RLE Assessment: Within Functional Limits LLE Assessment LLE Assessment:  (quads 2+/5, HS 3/5, hip flex 3-/5, hip ext 3-/5, DF trace)   See Function Navigator for Current Functional Status.  DONAWERTH,KAREN 01/31/2017, 3:36 PM

## 2017-01-31 NOTE — Progress Notes (Signed)
Occupational Therapy Discharge Summary  Patient Details  Name: Terry Juarez MRN: [id number] Date of Birth: January 29, 1966  Today's Date: 01/31/2017 OT Individual Time: 5681-2751 OT Individual Time Calculation (min): 45 min   Skilled OT interventions: 1:1. Family present for education/training. Pt with no c/o pain. Reviewed supervision for toilet transfers, providing assistance for shower transfers, and floor recovery. OT demonstrated each transfer w/c<>BSC and w/c<>BSC in shower stall to simulate home environment. Pt's family able to return demonstrate supervision of toilet transfers and provide steadying A as pt steps into shower stall with intermittent A to advance L foot over ledge. In gym, OT assisted pt safely onto padded mat on floor in front large blue transfer mat and demonstrated proper sequence of safe floor transfer. Family verbalized understanding and sequencing. Educated family and pt on importance of checking body for pain, asking about LOC, or hitting head prior to getting pt up from floor after a fall. Exited session with pt seated in w/c with call light in reach and family in room.   Patient has met 14 of 14 long term goals due to improved activity tolerance, improved balance, postural control, ability to compensate for deficits, functional use of  LEFT upper extremity, improved attention and improved awareness.  Patient to discharge at overall Supervision level.  Patient's care partner is independent to provide the necessary physical and cognitive assistance at discharge.    Reasons goals not met: n/a  Recommendation:  Patient will benefit from ongoing skilled OT services in home health setting to continue to advance functional skills in the area of BADL and iADL. Pt would benefit from continues work on balance, NMR of RUE/LE, shower transfers, IADLs and safety awareness.   Equipment: BSC  Reasons for discharge: treatment goals met and discharge from hospital  Patient/family  agrees with progress made and goals achieved: Yes   OT Discharge Precautions/Restrictions  Precautions Precautions: Fall Precaution Comments: Peritoneal dialysis patient; L subluxed shoulder Restrictions Weight Bearing Restrictions: No Pain Pain Assessment Pain Assessment: No/denies pain ADL ADL ADL Comments: refer to functional navigator Vision Baseline Vision/History: Wears glasses Wears Glasses: Distance only Patient Visual Report: Blurring of vision Vision Assessment?: Yes Eye Alignment: Within Functional Limits Ocular Range of Motion: Within Functional Limits Alignment/Gaze Preference: Within Defined Limits Visual Fields: No apparent deficits Perception  Inattention/Neglect:  (mild L inattention) Praxis Praxis: Intact Cognition Orientation Level: Oriented X4 Selective Attention: Appears intact Selective Attention Impairment: Verbal basic;Functional basic Memory: Appears intact Awareness: Appears intact Sensation Sensation Light Touch: Appears Intact Stereognosis: Appears Intact Hot/Cold: Appears Intact Proprioception: Appears Intact Coordination Gross Motor Movements are Fluid and Coordinated: No (L hemi) Fine Motor Movements are Fluid and Coordinated: No (L hemi) Motor  Motor Motor: Hemiplegia Mobility  Transfers Transfers: Sit to Stand Sit to Stand: 6: Modified independent (Device/Increase time)  Trunk/Postural Assessment  Cervical Assessment Cervical Assessment: Within Functional Limits Thoracic Assessment Thoracic Assessment: Within Functional Limits Lumbar Assessment Lumbar Assessment: Within Functional Limits Postural Control Postural Control: Deficits on evaluation (delayed/absent on L)  Balance Static Sitting Balance Static Sitting - Level of Assistance: 6: Modified independent (Device/Increase time) Dynamic Sitting Balance Dynamic Sitting - Level of Assistance: 6: Modified independent (Device/Increase time) Static Standing Balance Static  Standing - Level of Assistance: 5: Stand by assistance Dynamic Standing Balance Dynamic Standing - Level of Assistance: 5: Stand by assistance Extremity/Trunk Assessment RUE Assessment RUE Assessment: Within Functional Limits LUE Assessment LUE Assessment: Exceptions to [facility] (subluxation)   See Function Navigator for Current Functional Status.  Terry Juarez Terry Juarez 01/31/2017, 3:22 PM

## 2017-01-31 NOTE — Progress Notes (Signed)
Physical Therapy Note  Patient Details  Name: MALIHA OUTTEN MRN: [id number] Date of Birth: 08-31-1965 Today's Date: 01/31/2017    Time: 1440-1525 45 minutes  1:1 No c/o pain. Pt's family present for family education and performed supervision for simulated car transfer, min A for gait with quad cane and min A for curb step negotiation with quad cane.  Pt/family educated on need for hands on assist with gait at all times, mod I for transfers and w/c mobiliyt.  W/c mobility throughout unit with mod I, stair negotiation x 4 steps with min A with 1 railing, gait on ramp with min A with quad cane.  Pt's family educated on positioning of arm in bed and seated, w/c parts management, recommendation to remove throw rugs to decrease fall risk, and recommendation to wear AFO at all times for gait.  Pt/family state they feel ready for d/c home tomorrow at this level of care.   Lamere Lightner 01/31/2017, 3:31 PM

## 2017-01-31 NOTE — Progress Notes (Signed)
[place] PHYSICAL MEDICINE & REHABILITATION     PROGRESS NOTE  Subjective/Complaints:  Pt seen laying in bed this AM.  She slept well overnight.  She is looking forward to d/c tomorrow.   ROS: Denies CP, SOB, N/V/D.  Objective: Vital Signs: Blood pressure 130/80, pulse 69, temperature 98.3 F (36.8 C), temperature source Oral, resp. rate 18, height 5\' 5"  (1.651 m), weight 49.2 kg (108 lb 7.5 oz), SpO2 99 %. No results found. No results for input(s): WBC, HGB, HCT, PLT in the last 72 hours. No results for input(s): NA, K, CL, GLUCOSE, BUN, CREATININE, CALCIUM in the last 72 hours.  Invalid input(s): CO CBG (last 3)  No results for input(s): GLUCAP in the last 72 hours.  Wt Readings from Last 3 Encounters:  01/30/17 49.2 kg (108 lb 7.5 oz)  01/04/17 50.7 kg (111 lb 12.4 oz)  09/04/16 54.4 kg (120 lb)    Physical Exam:  BP 130/80 (BP Location: Left Arm)   Pulse 69   Temp 98.3 F (36.8 C) (Oral)   Resp 18   Ht 5\' 5"  (1.651 m)   Wt 49.2 kg (108 lb 7.5 oz)   SpO2 99%   BMI 18.05 kg/m  Constitutional: She appears well-developed. Frail  HENT: Normocephalic and atraumatic.  Eyes: EOMI. No discharge.  Cardiovascular: RRR. No JVD. Respiratory: CTA Bilaterally. Normal effort.  GI: Soft. Bowel sounds are normal.   Musculoskeletal: She exhibits no edema or tenderness.  Neurological: She is alert.  Fair awareness of deficits.  Motor: RUE/RLE: 5/5 LUE 0/5  LLE: 2/5 Hip/knee ext synergy, ADF/PF 0/5 Skin: Skin is warm and dry.  Psychiatric: Her behavior is normal. Her affect is blunt. She does not engage much   Assessment/Plan: 1. Functional deficits secondary to right frontal hemorrhage, small SAH and signs of PRES which require 3+ hours per day of interdisciplinary therapy in a comprehensive inpatient rehab setting. Physiatrist is providing close team supervision and 24 hour management of active medical problems listed below. Physiatrist and rehab team continue to assess  barriers to discharge/monitor patient progress toward functional and medical goals.  Function:  Bathing Bathing position   Position: Shower  Bathing parts Body parts bathed by patient: Right arm, Left arm, Chest, Abdomen, Front perineal area, Buttocks, Right upper leg, Left lower leg, Right lower leg, Left upper leg Body parts bathed by helper: Right arm, Back  Bathing assist Assist Level: Supervision or verbal cues      Upper Body Dressing/Undressing Upper body dressing   What is the patient wearing?: Pull over shirt/dress Bra - Perfomed by patient: Thread/unthread right bra strap Bra - Perfomed by helper: Thread/unthread left bra strap, Hook/unhook bra (pull down sports bra) Pull over shirt/dress - Perfomed by patient: Put head through opening, Thread/unthread right sleeve, Thread/unthread left sleeve, Pull shirt over trunk Pull over shirt/dress - Perfomed by helper: Pull shirt over trunk        Upper body assist Assist Level: More than reasonable time   Set up : To obtain clothing/put away  Lower Body Dressing/Undressing Lower body dressing   What is the patient wearing?: Underwear, Pants, Socks, Shoes, AFO Underwear - Performed by patient: Thread/unthread right underwear leg, Thread/unthread left underwear leg, Pull underwear up/down Underwear - Performed by helper: Pull underwear up/down Pants- Performed by patient: Thread/unthread right pants leg, Fasten/unfasten pants, Pull pants up/down, Thread/unthread left pants leg Pants- Performed by helper: Pull pants up/down Non-skid slipper socks- Performed by patient: Don/doff right sock Non-skid slipper socks-  Performed by helper: Don/doff left sock Socks - Performed by patient: Don/doff left sock, Don/doff right sock Socks - Performed by helper: Don/doff right sock Shoes - Performed by patient: Don/doff right shoe, Don/doff left shoe, Fasten right, Fasten left Shoes - Performed by helper: Don/doff left shoe, Fasten left AFO -  Performed by patient: Don/doff left AFO AFO - Performed by helper: Don/doff left AFO TED Hose - Performed by patient: Don/doff right TED hose TED Hose - Performed by helper: Don/doff right TED hose, Don/doff left TED hose  Lower body assist Assist for lower body dressing: Assistive device, More than reasonable time      Naval architect activity did not occur: No continent bowel/bladder event Toileting steps completed by patient: Adjust clothing prior to toileting, Performs perineal hygiene, Adjust clothing after toileting Toileting steps completed by helper: Adjust clothing after toileting Toileting Assistive Devices: Grab bar or rail  Toileting assist Assist level: Touching or steadying assistance (Pt.75%)   Transfers Chair/bed transfer   Chair/bed transfer method: Ambulatory Chair/bed transfer assist level: No Help, no cues, assistive device, takes more than a reasonable amount of time Chair/bed transfer assistive device: Armrests, Librarian, academic     Max distance: 40' Assist level: Touching or steadying assistance (Pt > 75%)   Wheelchair   Type: Manual Max wheelchair distance: 172ft Assist Level: No help, No cues, assistive device, takes more than reasonable amount of time  Cognition Comprehension Comprehension assist level: Follows complex conversation/direction with no assist  Expression Expression assist level: Expresses complex ideas: With extra time/assistive device  Social Interaction Social Interaction assist level: Interacts appropriately with others with medication or extra time (anti-anxiety, antidepressant).  Problem Solving Problem solving assist level: Solves complex problems: With extra time  Memory Memory assist level: Recognizes or recalls 75 - 89% of the time/requires cueing 10 - 24% of the time    Medical Problem List and Plan: 1.  Decreased functional mobility secondary to right frontal hemorrhage, small SAH and signs of  PRES  Cont CIR, plan for d/c tomorrow  PRAFO/WHO  Fluoxetine started 7/3, increased to 20 on 7/10 2.  DVT Prophylaxis/Anticoagulation: SCDs.   Vascular study neg- no clinical sign DVT 3. Pain Management: Tylenol as needed 4. Mood: Provide emotional support, pt feeling more optimistic about recovery 5. Neuropsych: This patient is  fully capable of making decisions on her own behalf.per Neuropsych Eval 6. Skin/Wound Care: Routine skin checks 7. Fluids/Electrolytes/Nutrition:  8. Seizure prophylaxis. Keppra 500 mg every 12 hours 9. ESRD with history of right renal cancer status post nephrectomy. Continue peritoneal dialysis as per renal services 10. Hypertension. At home Norvasc 5 mg twice daily, Coreg 12.5 mg twice a day, Cozaar 50 mg daily, , amlodipine increased from 5- > 10mg  per Nephro on 7/11, Nephro also adjusting dialysate  Controlled 7/29   Vitals:   01/30/17 1915 01/31/17 0554  BP: 118/76 130/80  Pulse: 75 69  Resp: 18 18  Temp: 98.5 F (36.9 C) 98.3 F (36.8 C)   11. Hypothyroidism. Synthroid 12.Constipation. Laxative assistance, last recorded BM, senna S   Improving 13. Fall  Does not appear to have any resulting injuries      LOS (Days) 27 A FACE TO FACE EVALUATION WAS PERFORMED  Francis Yardley [name] 01/31/2017 7:01 AM

## 2017-01-31 NOTE — Progress Notes (Signed)
[facility] Progress Note  Subjective: stable   Vitals:   01/30/17 0840 01/30/17 1500 01/30/17 1915 01/31/17 0554  BP:  125/80 118/76 130/80  Pulse:  75 75 69  Resp:  18 18 18   Temp:  98.3 F (36.8 C) 98.5 F (36.9 C) 98.3 F (36.8 C)  TempSrc:  Oral Oral Oral  SpO2:  98% 97% 99%  Weight: 49.2 kg (108 lb 7.5 oz)     Height:        Inpatient medications: . amLODipine  10 mg Oral Daily  . calcium acetate  1,334 mg Oral TID WC  . carvedilol  12.5 mg Oral BID WC  . famotidine  20 mg Oral Daily  . FLUoxetine  20 mg Oral Daily  . gentamicin cream  1 application Topical Daily  . levETIRAcetam  500 mg Oral BID  . levothyroxine  88 mcg Oral QAC breakfast  . multivitamin  1 tablet Oral Daily  . pantoprazole  40 mg Oral BID  . polyethylene glycol  17 g Oral Daily  . senna-docusate  3 tablet Oral BID   . dialysis solution 1.5% low-MG/low-CA     acetaminophen, bisacodyl, heparin, sorbitol  Exam: Exam: General: slender NAD HEENT: MMM NECK: no JVD CV Regular S1S2 No S3 PULM Lungs clear ABD: soft NT EXTNo LE edema NEURO Left hemiparesis, some improvement ACCESS: PD cath with clear dry dressing R abdomen; Right upper arm AVF aneurysmal/+ bruit   Dialysis:PD 5 exchanges overnight, no day bag, no pause, 2000 volume, using all 1.5%- UF of 1.5 liters   Summary: Pt is a 51 y.o.yo femalewith ESRD on PD who was admitted on 6/16/2018with seizures and L hemiparesis. Found to have right sided frontal lobe hemorrhage and PRES.  Had been transitioned from PD--> HD but had cardiac arrest 12/25/16 in inpatient HD.  Back on PD now.  Transferred to Rehab 01/04/17.  Assessment/ Plan: 1. CVA with R frontal SAH and PRES: Pt has L hemiparesis.  In CIR.  2. Seizures: controlled on Keppra. 3. ESRD: Continue CCPD. Use all 1.5%, 5 exchanges overnight and dry during day. BP's and wt's and labs are all very stable.  4. HTN/volume: stable.  On Coreg and amlodipine. 5. Cardiac  arrest: transitioned PD to HD after CVA. Then had cardiac arrest while on HD 6/22. Refused offer to resume HD again after recovery from arrest, now on PD again. 6. Anemia: history of GIB. Hb  ~13. Holding ESA 7. MBD: No sensipar, on Phoslo, increased to 2 caps TID for P control.  Ca inching upwards. May need nonCa binder if Ca continues to rise.  8. Nutrition: Alb 3.0, slowly improving; regular diet, Nepro, vits  9. Dispo: for dc tomorrow, will to to home training after DC Monday and they will train family x 1wk   [name] [place] pager [phone]   01/31/2017, 11:13 AM     No results for input(s): NA, K, CL, CO2, GLUCOSE, BUN, CREATININE, CALCIUM, PHOS in the last 168 hours.  Invalid input(s): ALBUMIN3 No results for input(s): AST, ALT, ALKPHOS, BILITOT, PROT, ALBUMIN in the last 168 hours. No results for input(s): WBC, NEUTROABS, HGB, HCT, MCV, PLT in the last 168 hours. Iron/TIBC/Ferritin/ %Sat No results found for: IRON, TIBC, FERRITIN, IRONPCTSAT

## 2017-02-01 ENCOUNTER — Encounter (HOSPITAL_COMMUNITY): Payer: Self-pay

## 2017-02-01 MED ORDER — CALCIUM ACETATE (PHOS BINDER) 667 MG PO CAPS: 1334.0000 mg | ORAL_CAPSULE | Freq: Three times a day (TID) | ORAL | 0 refills | Status: DC

## 2017-02-01 MED ORDER — AMLODIPINE BESYLATE 10 MG PO TABS: 10.0000 mg | ORAL_TABLET | Freq: Every day | ORAL | 0 refills | Status: AC

## 2017-02-01 MED ORDER — PANTOPRAZOLE SODIUM 40 MG PO TBEC: 40.0000 mg | DELAYED_RELEASE_TABLET | Freq: Two times a day (BID) | ORAL | 0 refills | Status: AC

## 2017-02-01 MED ORDER — FAMOTIDINE 20 MG PO TABS: 20.0000 mg | ORAL_TABLET | Freq: Every day | ORAL | 0 refills | Status: DC

## 2017-02-01 MED ORDER — LEVOTHYROXINE SODIUM 88 MCG PO TABS: 88.0000 ug | ORAL_TABLET | Freq: Every day | ORAL | 0 refills | Status: DC

## 2017-02-01 MED ORDER — GENTAMICIN SULFATE 0.1 % EX CREA: 1.0000 "application " | TOPICAL_CREAM | Freq: Every day | CUTANEOUS | 3 refills | Status: DC

## 2017-02-01 MED ORDER — FLUOXETINE HCL 20 MG PO CAPS: 20.0000 mg | ORAL_CAPSULE | Freq: Every day | ORAL | 0 refills | Status: DC

## 2017-02-01 MED ORDER — POLYETHYLENE GLYCOL 3350 17 G PO PACK: 17.0000 g | PACK | Freq: Every day | ORAL | 0 refills | Status: DC

## 2017-02-01 MED ORDER — SENNOSIDES-DOCUSATE SODIUM 8.6-50 MG PO TABS: 3.0000 | ORAL_TABLET | Freq: Two times a day (BID) | ORAL | 0 refills | Status: DC

## 2017-02-01 MED ORDER — CARVEDILOL 12.5 MG PO TABS: 12.5000 mg | ORAL_TABLET | Freq: Two times a day (BID) | ORAL | 0 refills | Status: DC

## 2017-02-01 MED ORDER — LEVETIRACETAM 500 MG PO TABS: 500.0000 mg | ORAL_TABLET | Freq: Two times a day (BID) | ORAL | 0 refills | Status: DC

## 2017-02-01 NOTE — Progress Notes (Signed)
02/01/17  1000  PA came to the room and reviewed all discharge instructions with patient and her family.

## 2017-02-01 NOTE — Patient Care Conference (Signed)
Inpatient RehabilitationTeam Conference and Plan of Care Update Date: 01/27/2017   Time: 11:05 AM    Patient Name: Terry Juarez      Medical Record Number: [id number]  Date of Birth: Jan 25, 1966 Sex: Female         Room/Bed: 4M01C/4M01C-01 Payor Info: Payor: BLUE CROSS BLUE SHIELD / Plan: BCBS OTHER / Product Type: *No Product type* /    Admitting Diagnosis: Rt Frontal Hemorr hage  Admit Date/Time:  01/04/2017  6:41 PM Admission Comments: No comment available   Primary Diagnosis:  Flaccid hemiplegia of left nondominant side as late effect of nontraumatic intraparenchymal hemorrhage of brain ([place]) Principal Problem: Flaccid hemiplegia of left nondominant side as late effect of nontraumatic intraparenchymal hemorrhage of brain [facility])  Patient Active Problem List   Diagnosis Date Noted  . Left hemiparesis ([place])   . Fall   . Slow transit constipation 01/28/2017  . Flaccid hemiplegia of left nondominant side as late effect of nontraumatic intraparenchymal hemorrhage of brain ([place])   . ESRD on dialysis ([place])   . Leukocytosis   . Hyperglycemia   . Hypertensive emergency 01/04/2017  . Acute left hemiparesis ([place]) 01/04/2017  . Intracranial hemorrhage ([place]) 01/04/2017  . Subarachnoid hematoma ([place])   . Hypothyroidism   . Benign essential HTN   . Seizure prophylaxis   . Cardiac arrest, cause unspecified ([place])   . Acute respiratory failure with hypoxemia ([place])   . Acute encephalopathy   . ICH (intracerebral hemorrhage) ([place])   . Seizure ([place])   . PRES (posterior reversible encephalopathy syndrome)   . Subarachnoid hemorrhage 12/19/2016  . Symptomatic anemia 07/29/2016  . Cough 01/09/2016  . Elevated troponin 02/12/2015  . Headache 02/12/2015  . Syncope 02/12/2015  . ESRD on peritoneal dialysis ([place]) 02/12/2015  . Hypertension   . Hyperlipidemia   . Anemia   . Essential hypertension   . Cephalalgia   . Right renal mass 07/25/2014    Expected Discharge Date: Expected Discharge  Date: 02/01/17  Team Members Present: Physician leading conference: Dr. Alysia Penna Social Worker Present: Alfonse Alpers, LCSW Nurse Present: Other (comment) Patsy Lager., RN) PT Present: Dwyane Dee, PT OT Present: Cherylynn Ridges, OT SLP Present: Windell Moulding, SLP PPS Coordinator present : Daiva Nakayama, RN, CRRN     Current Status/Progress Goal Weekly Team Focus  Medical   Still with significant left upper extremity and left lower extremity weakness, mood is improving.  Transition to outpatient peritoneal dialysis  Social worker to coordinate with nephrology on outpatient peritoneal dialysis   Bowel/Bladder   PD/anuric; continent of bowel with Mod I assist  Continent of bowel  assess need for laxative/stool softener for regular bowel movement   Swallow/Nutrition/ Hydration             ADL's   Supervision overall  Supervision/Mod I overall  L UE NMR, dc planning, modified bathing/dressing, NMES, pt/family ed, transfer training, postural control   Mobility   supervision bed mobility and transfers, min-mod A ambulation with hemiwalker, mod-I w/c  mod-I bed mobility, transfers and w/c; min A ambulation with LRAD, mod A stairs   ambulation, NMR of L LE, transfers   Communication             Safety/Cognition/ Behavioral Observations  no unsafe behavior  supervision  recall and complex problem solving   Pain   n/a         Skin   n/a          Rehab Goals Patient  on target to meet rehab goals: Yes Rehab Goals Revised: none *See Care Plan and progress notes for long and short-term goals.     Barriers to Discharge  Current Status/Progress Possible Resolutions Date Resolved   Physician    Other (comments);Inaccessible home environment  Peritoneal dialysis  Progressing toward goals  Moving to another home with family. Outpatient home dialysis training to start next week      Nursing                  PT                    OT                  SLP                SW      Ramp is completed.          Discharge Planning/Teaching Needs:  Pt to d/c to her brother-in-law and sister-in-law's home in [place], [place].  Family has received family education.  They will have PD training all next week.   Team Discussion:  Pt is doing well with PD and BP is well regulated.  Pt with some UE return, but LE return is slightly better.  Pt is refusing nephro supplements and eats 50-100% of her meals.  Pt's mood is better per OT, as is balance.  Pt needs supervision for ADLs and is on track to meet goals by d/c.  Pt is progressing with PT and is supervision for transfers and bed mobility.  Walked 120' with min A with hemi-walker.  Stairs are min A with mod I PT goals, except min A for ambulation.  Revisions to Treatment Plan:  none    Continued Need for Acute Rehabilitation Level of Care: The patient requires daily medical management by a physician with specialized training in physical medicine and rehabilitation for the following conditions: Daily direction of a multidisciplinary physical rehabilitation program to ensure safe treatment while eliciting the highest outcome that is of practical value to the patient.: Yes Daily medical management of patient stability for increased activity during participation in an intensive rehabilitation regime.: Yes Daily analysis of laboratory values and/or radiology reports with any subsequent need for medication adjustment of medical intervention for : Neurological problems;Renal problems  Chanay Nugent, [name] 02/01/2017, 10:35 AM

## 2017-02-01 NOTE — Progress Notes (Signed)
Social Work Patient ID: Terry Juarez, female   DOB: 1966/01/05, 51 y.o.   MRN: [id number]   CSW met with pt and spoke with pt's brother-in-law on 01-27-17 to update them on team conference discussion.  They are prepared to take pt home and have set up the training for PD with nephrology.  CSW is ordering DME and will continue to work on finding [facility] agency for f/u therapies.  Pt has Medicaid and SSD applications for brother-in-law to help her complete.  CSW offered assistance as needed.  CSW will also arrange f/u visits with Dr. Letta Pate and Dr. Osborne Casco.  CSW remains available to assist.

## 2017-02-01 NOTE — Discharge Summary (Signed)
Discharge summary job # [id number]

## 2017-02-01 NOTE — Plan of Care (Signed)
Problem: RH SKIN INTEGRITY Goal: RH STG SKIN FREE OF INFECTION/BREAKDOWN Mod I  Outcome: Progressing No skin issues   Problem: RH SAFETY Goal: RH STG ADHERE TO SAFETY PRECAUTIONS W/ASSISTANCE/DEVICE STG Adhere to Safety Precautions With Mod Assistance/Device.  Outcome: Progressing No safety issues

## 2017-02-01 NOTE — Progress Notes (Signed)
[facility] PHYSICAL MEDICINE & REHABILITATION     PROGRESS NOTE  Subjective/Complaints:  Family coming in, will be moving to [facility]   ROS: Denies CP, SOB, N/V/D.  Objective: Vital Signs: Blood pressure (!) 143/81, pulse 74, temperature 98 F (36.7 C), temperature source Oral, resp. rate 18, height 5\' 5"  (1.651 m), weight 50.6 kg (111 lb 8.8 oz), SpO2 97 %. No results found. No results for input(s): WBC, HGB, HCT, PLT in the last 72 hours. No results for input(s): NA, K, CL, GLUCOSE, BUN, CREATININE, CALCIUM in the last 72 hours.  Invalid input(s): CO CBG (last 3)  No results for input(s): GLUCAP in the last 72 hours.  Wt Readings from Last 3 Encounters:  01/31/17 50.6 kg (111 lb 8.8 oz)  01/04/17 50.7 kg (111 lb 12.4 oz)  09/04/16 54.4 kg (120 lb)    Physical Exam:  BP (!) 143/81 (BP Location: Left Arm)   Pulse 74   Temp 98 F (36.7 C) (Oral)   Resp 18   Ht 5\' 5"  (1.651 m)   Wt 50.6 kg (111 lb 8.8 oz)   SpO2 97%   BMI 18.56 kg/m  Constitutional: She appears well-developed. Frail  HENT: Normocephalic and atraumatic.  Eyes: EOMI. No discharge.  Cardiovascular: RRR. No JVD. Respiratory: CTA Bilaterally. Normal effort.  GI: Soft. Bowel sounds are normal.   Musculoskeletal: She exhibits no edema or tenderness.  Neurological: She is alert.  Fair awareness of deficits.  Motor: RUE/RLE: 5/5 LUE 0/5  LLE: 2/5 Hip/knee ext synergy, ADF/PF tr/5 Skin: Skin is warm and dry.  Psychiatric: Her behavior is normal. Her affect is blunt. She does not engage much   Assessment/Plan: 1. Functional deficits secondary to right frontal hemorrhage, small SAH and signs of PRES  Stable for D/C today F/u PCP in 3-4 weeks F/u PM&R 2 weeks See D/C summary See D/C instructions.  Function:  Bathing Bathing position   Position: Shower  Bathing parts Body parts bathed by patient: Right arm, Left arm, Chest, Abdomen, Front perineal area, Buttocks, Right upper leg, Left lower leg,  Right lower leg, Left upper leg Body parts bathed by helper: Right arm, Back  Bathing assist Assist Level: Supervision or verbal cues      Upper Body Dressing/Undressing Upper body dressing   What is the patient wearing?: Pull over shirt/dress Bra - Perfomed by patient: Thread/unthread right bra strap Bra - Perfomed by helper: Thread/unthread left bra strap, Hook/unhook bra (pull down sports bra) Pull over shirt/dress - Perfomed by patient: Put head through opening, Thread/unthread right sleeve, Thread/unthread left sleeve, Pull shirt over trunk Pull over shirt/dress - Perfomed by helper: Pull shirt over trunk        Upper body assist Assist Level: More than reasonable time   Set up : To obtain clothing/put away  Lower Body Dressing/Undressing Lower body dressing   What is the patient wearing?: Underwear, Pants, Socks, Shoes, AFO Underwear - Performed by patient: Thread/unthread right underwear leg, Thread/unthread left underwear leg, Pull underwear up/down Underwear - Performed by helper: Pull underwear up/down Pants- Performed by patient: Thread/unthread right pants leg, Fasten/unfasten pants, Pull pants up/down, Thread/unthread left pants leg Pants- Performed by helper: Pull pants up/down Non-skid slipper socks- Performed by patient: Don/doff right sock Non-skid slipper socks- Performed by helper: Don/doff left sock Socks - Performed by patient: Don/doff left sock, Don/doff right sock Socks - Performed by helper: Don/doff right sock Shoes - Performed by patient: Don/doff right shoe, Don/doff left shoe, Fasten right,  Fasten left Shoes - Performed by helper: Don/doff left shoe, Fasten left AFO - Performed by patient: Don/doff left AFO AFO - Performed by helper: Don/doff left AFO TED Hose - Performed by patient: Don/doff right TED hose TED Hose - Performed by helper: Don/doff right TED hose, Don/doff left TED hose  Lower body assist Assist for lower body dressing: Assistive device,  More than reasonable time      Naval architect activity did not occur: No continent bowel/bladder event Toileting steps completed by patient: Adjust clothing prior to toileting, Performs perineal hygiene, Adjust clothing after toileting Toileting steps completed by helper: Adjust clothing after toileting Toileting Assistive Devices: Grab bar or rail  Toileting assist Assist level: Touching or steadying assistance (Pt.75%)   Transfers Chair/bed transfer   Chair/bed transfer method: Ambulatory Chair/bed transfer assist level: No Help, no cues, assistive device, takes more than a reasonable amount of time Chair/bed transfer assistive device: Armrests, Librarian, academic     Max distance: 150 Assist level: Supervision or verbal cues   Wheelchair   Type: Manual Max wheelchair distance: 163ft Assist Level: No help, No cues, assistive device, takes more than reasonable amount of time  Cognition Comprehension Comprehension assist level: Follows complex conversation/direction with no assist  Expression Expression assist level: Expresses complex ideas: With extra time/assistive device  Social Interaction Social Interaction assist level: Interacts appropriately with others with medication or extra time (anti-anxiety, antidepressant).  Problem Solving Problem solving assist level: Solves complex problems: With extra time  Memory Memory assist level: Recognizes or recalls 75 - 89% of the time/requires cueing 10 - 24% of the time    Medical Problem List and Plan: 1.  Decreased functional mobility secondary to right frontal hemorrhage, small SAH and signs of PRES  Stable for D/C  PRAFO/WHO  Fluoxetine started 7/3, increased to 20 on 7/10 2.  DVT Prophylaxis/Anticoagulation: SCDs.   Vascular study neg- no clinical sign DVT 3. Pain Management: Tylenol as needed 4. Mood: Provide emotional support, pt feeling more optimistic about recovery 5. Neuropsych: This  patient is  fully capable of making decisions on her own behalf.per Neuropsych Eval 6. Skin/Wound Care: Routine skin checks 7. Fluids/Electrolytes/Nutrition:  8. Seizure prophylaxis. Keppra 500 mg every 12 hours 9. ESRD with history of right renal cancer status post nephrectomy. Continue peritoneal dialysis as per renal services 10. Hypertension. At home Norvasc 5 mg twice daily, Coreg 12.5 mg twice a day, Cozaar 50 mg daily, , amlodipine increased from 5- > 10mg  per Nephro on 7/11, Nephro also adjusting dialysate  Controlled 7/29   Vitals:   01/31/17 1440 02/01/17 0526  BP: (!) 153/87 (!) 143/81  Pulse: 73 74  Resp: 18 18  Temp: 97.8 F (36.6 C) 98 F (36.7 C)   11. Hypothyroidism. Synthroid 12.Constipation. Laxative assistance, last recorded BM, senna S   Improving 13. Fall  No recurrence      LOS (Days) 28 A FACE TO FACE EVALUATION WAS PERFORMED  Charlett Blake 02/01/2017 6:52 AM

## 2017-02-01 NOTE — Discharge Summary (Signed)
Terry Juarez, Terry Juarez NO.:  [id number]  MEDICAL RECORD NO.:  [id number]  LOCATION:                                 FACILITY:  PHYSICIAN:  Charlett Blake, M.D.DATE OF BIRTH:  April 06, 1966  DATE OF ADMISSION:  01/04/2017 DATE OF DISCHARGE:  02/01/2017                              DISCHARGE SUMMARY   DISCHARGE DIAGNOSES: 1. Decreased functional ability secondary to right frontal hemorrhage,     small subarachnoid hemorrhage, and signs of posterior reversible     encephalopathy syndrome. 2. Sequential compression devices for deep venous thrombosis     prophylaxis. 3. Pain management. 4. Seizure prophylaxis. 5. End-stage renal disease with right renal cancer, status post     nephrectomy. 6. Hypertension. 7. Hypothyroidism. 8. Constipation.  HISTORY OF PRESENT ILLNESS:  This is a 51 year old right-handed female, history of hypertension, GI bleed, end-stage renal disease with peritoneal dialysis.  Lives in [place] with a roommate, independent prior to admission.  Presented December 09, 2016, with new onset of seizure, acute left side weakness, right frontal headache.  Urine drug screen positive for benzodiazepine.  Troponin 1.21.  CT showed a 29 mm right frontal hemorrhage with surrounding edema, mass effect, also a small SAH, and signs of PRES.  CT of head and neck showed no AVMs, but showed a hematoma, mildly increased from earlier imaging.  Maintained on Keppra for seizure prophylaxis.  MRI follow up stable.  EEG, no seizure activity.  Hemodialysis ongoing.  The patient was admitted for comprehensive rehab program.  PAST MEDICAL HISTORY:  See discharge diagnoses.  SOCIAL HISTORY:  Lives with a roommate, independent prior to admission. Plan to stay with her family on discharge.  FUNCTIONAL STATUS:  Upon admission to rehab services was +2 physical assist, sit to stand; max assist, squat pivot transfers; max total assist, activities of daily  living.  PHYSICAL EXAMINATION:  VITAL SIGNS:  Blood pressure 143/93, pulse 79, temperature 98, and respirations 18. GENERAL:  This was an alert female, followed basic questions, provides name and age. EYES:  EOMs intact. NECK:  Supple, nontender.  No JVD. CARDIAC:  Rate controlled. ABDOMEN:  Soft, nontender.  Good bowel sounds. LUNGS:  Clear to auscultation without wheeze.  Dense with left-sided weakness.  REHABILITATION HOSPITAL COURSE:  The patient was admitted to inpatient rehab services.  Therapies initiated on a 3-hour daily basis, consisting of physical therapy, occupational therapy, and rehabilitation nursing. The following issues were addressed during the patient's rehabilitation stay.  Pertaining to Terry Juarez's right frontal hemorrhage, small SAH remained stable.  She would follow up Neurology Services.  SCDs for DVT prophylaxis, venous Doppler studies negative.  She continued on peritoneal dialysis as advised, follow up per Renal Services.  Keppra for seizure prophylaxis, EEG negative.  Her diet had been advanced. Blood pressure is controlled with present regimen.  Hormone supplement for hypothyroidism.  The patient received weekly collaborative interdisciplinary team conferences to discuss estimated length of stay, family teaching, any barriers to her discharge.  Perform supervision for simulated car transfers, minimal assist for ambulation with a quad cane, minimal assist for curb negotiation with a quad cane, modified independent for transfers and wheelchair mobility, negotiated  stairs with minimal assistance.  Ambulation on a ramp with minimal assist using a quad cane.  She could gather belongings for activities of daily living and homemaking.  Reviewed supervision for toilet transfer, providing assistance for shower transfers, and floor recovery.  Plan was to be discharged to home.  DISCHARGE MEDICATIONS:  At time of dictation included, 1. Norvasc 10 mg daily. 2.  Calcium acetate 667 mg two capsules t.i.d. 3. Coreg 12.5 mg p.o. b.i.d. 4. Pepcid 20 mg p.o. daily. 5. Prozac 20 mg p.o. daily. 6. Keppra 500 mg p.o. twice daily. 7. Synthroid 88 mcg p.o. daily. 8. Multivitamin daily. 9. Protonix 40 mg p.o. daily. 10.MiraLAX daily.  DIET:  Her diet was a renal diet.  FOLLOWUP:  The patient would follow up with Dr. Alysia Penna at the [facility] as advised; Dr. Roney Jaffe, call for appointment; Dr. Erlinda Hong, Neurology Service, call for appointment 5 weeks; Dr. Domenick Gong, medical management.     Terry Juarez, P.A.   ______________________________ Charlett Blake, M.D.    DA/MEDQ  D:  02/01/2017  T:  02/01/2017  Job:  [id number]  cc:   [name], M.D. Charlett Blake, M.D. Sol Blazing, M.D. Dr. Erlinda Hong, Neurology services

## 2017-02-02 NOTE — Progress Notes (Signed)
Social Work Discharge Note  The overall goal for the admission was met for:   Discharge location: Yes - home with brother-in-law and sister-in-law in [place], [place]  Length of Stay: Yes - 28 days  Discharge activity level: Yes - supervision to mod I w/c level  Home/community participation: Yes  Services provided included: MD, RD, PT, OT, SLP, RN, Pharmacy, Neuropsych and SW  Financial Services: Medicare and Private Insurance: [place] (primary)  Follow-up services arranged: Home Health: RN/PT/OT from [facility], DME: quad cane; drop arm commode; 16"x16" lightweight w/c with left half lap tray and basic cushion from [place] and Patient/Family has no preference for HH/DME agencies  Comments (or additional information):  Pt to d/c to her home in [place] this week so that family can complete PD training at dialysis center.  Then, on Saturday, pt and family will travel home to [place], [place] where pt will start her home health care and PD at home.  CSW arranged pt's f/u appts so that pt would not have to come back to [place] multiple times.  Medicaid application completed, but not yet submitted.  Will need to be submitted in county of residence.  CSW gave SSD application and offered assistance with it, as needed.  Pt and family were appreciative of CSW's assistance.    Patient/Family verbalized understanding of follow-up arrangements: Yes  Individual responsible for coordination of the follow-up plan: Pt's brother-in-law and sister-in-law  Confirmed correct DME delivered: Trey Sailors 02/02/2017    Gene Colee, [name]

## 2017-02-02 NOTE — Discharge Instructions (Signed)
Inpatient Rehab Discharge Instructions  Terry Juarez Discharge date and time: 02/01/17   Activities/Precautions/ Functional Status: Activity: activity as tolerated Diet: renal diet Wound Care: none needed   Functional status:  ___ No restrictions     ___ Walk up steps independently _X__ 24/7 supervision/assistance   ___ Walk up steps with assistance ___ Intermittent supervision/assistance  ___ Bathe/dress independently ___ Walk with walker     _X__ Bathe/dress with assistance ___ Walk Independently    ___ Shower independently ___ Walk with assistance    ___ Shower with assistance _X__ No alcohol     ___ Return to work/school ________  COMMUNITY REFERRALS UPON DISCHARGE:   Home Health:   PT    OT RN             Agency:  [facility]   Phone:  [phone] Medical Equipment/Items Ordered:  Large base quad cane; drop arm commode; 16"x16" lightweight wheelchair with anti-tippers, brake extenders, left half lap tray, and basic cushion  Agency/Supplier:  [facility]      Phone:  [phone]  Special Instructions: 1. Needs supervision for all mobility   My questions have been answered and I understand these instructions. I will adhere to these goals and the provided educational materials after my discharge from the hospital.  Patient/Caregiver Signature _______________________________ Date __________  Clinician Signature _______________________________________ Date __________  Please bring this form and your medication list with you to all your follow-up doctor's appointments. 2

## 2017-02-22 ENCOUNTER — Inpatient Hospital Stay: Payer: BLUE CROSS/BLUE SHIELD | Admitting: Physical Medicine & Rehabilitation

## 2017-03-02 ENCOUNTER — Encounter: Payer: Self-pay | Admitting: Physical Medicine & Rehabilitation

## 2017-03-02 ENCOUNTER — Encounter: Payer: BLUE CROSS/BLUE SHIELD | Attending: Physical Medicine & Rehabilitation

## 2017-03-02 ENCOUNTER — Ambulatory Visit (HOSPITAL_BASED_OUTPATIENT_CLINIC_OR_DEPARTMENT_OTHER): Payer: BLUE CROSS/BLUE SHIELD | Admitting: Physical Medicine & Rehabilitation

## 2017-03-02 VITALS — BP 139/80 | HR 73

## 2017-03-02 DIAGNOSIS — Z992 Dependence on renal dialysis: Secondary | ICD-10-CM | POA: Insufficient documentation

## 2017-03-02 DIAGNOSIS — N186 End stage renal disease: Secondary | ICD-10-CM | POA: Insufficient documentation

## 2017-03-02 DIAGNOSIS — I69354 Hemiplegia and hemiparesis following cerebral infarction affecting left non-dominant side: Secondary | ICD-10-CM | POA: Diagnosis not present

## 2017-03-02 DIAGNOSIS — I120 Hypertensive chronic kidney disease with stage 5 chronic kidney disease or end stage renal disease: Secondary | ICD-10-CM | POA: Insufficient documentation

## 2017-03-02 DIAGNOSIS — E785 Hyperlipidemia, unspecified: Secondary | ICD-10-CM | POA: Diagnosis not present

## 2017-03-02 NOTE — Patient Instructions (Signed)
No driving, may take prescription for PT, OT to any local outpatient therapy department

## 2017-03-02 NOTE — Progress Notes (Signed)
Subjective:    Patient ID: Terry Juarez, female    DOB: 06-30-1966, 51 y.o.   MRN: [id number] DATE OF ADMISSION:  01/04/2017 DATE OF DISCHARGE:  02/01/2017 51 year old female with history of right frontal hemorrhage onset 12/19/2016 HPI ModI dressing and bathing  Pt has been doing some light meal prep Home health PT, OT coming to home.   PD at home  Had a fall when she first was discharged, but no further Has not been driving Living with her brother and sister-in-law in [place]  Has follow-ups with her primary care physician, Dr. Osborne Casco in [place], has an appointment with Dr. Erlinda Hong  Pain Inventory Average Pain 0 Pain Right Now 0 My pain is na  In the last 24 hours, has pain interfered with the following? General activity 0 Relation with others 0 Enjoyment of life 0 What TIME of day is your pain at its worst? na Sleep (in general) Good  Pain is worse with: na Pain improves with: na Relief from Meds: na  Mobility walk with assistance use a cane ability to climb steps?  yes do you drive?  yes  Function disabled: date disabled 6/18  Neuro/Psych trouble walking  Prior Studies Any changes since last visit?  no  Physicians involved in your care Any changes since last visit?  no   Family History  Problem Relation Age of Onset  . Throat cancer Mother        smoked  . Hypertension Father    Social History   Social History  . Marital status: Widowed    Spouse name: N/A  . Number of children: N/A  . Years of education: N/A   Social History Main Topics  . Smoking status: Never Smoker  . Smokeless tobacco: Never Used  . Alcohol use 1.2 oz/week    2 Glasses of wine per week     Comment: occ  . Drug use: No  . Sexual activity: Yes   Other Topics Concern  . None   Social History Narrative  . None   Past Surgical History:  Procedure Laterality Date  . AV FISTULA PLACEMENT Right 07/11/2014   Procedure: ARTERIOVENOUS (AV) FISTULA CREATION  RIGHT ARM BRACHIO-CEPHALIC WITH ATTEMPTED RADIO-CEPHALIC (AV) FISTULA;  Surgeon: Mal Misty, MD;  Location: [facility];  Service: Vascular;  Laterality: Right;  . COLONOSCOPY WITH PROPOFOL N/A 09/04/2016   Procedure: COLONOSCOPY WITH PROPOFOL;  Surgeon: Carol Ada, MD;  Location: [facility];  Service: Endoscopy;  Laterality: N/A;  . ECTOPIC PREGNANCY SURGERY  1987  . ESOPHAGOGASTRODUODENOSCOPY N/A 07/30/2016   Procedure: ESOPHAGOGASTRODUODENOSCOPY (EGD);  Surgeon: Carol Ada, MD;  Location: [facility];  Service: Endoscopy;  Laterality: N/A;  Bedside  . ESOPHAGOGASTRODUODENOSCOPY (EGD) WITH PROPOFOL N/A 09/04/2016   Procedure: ESOPHAGOGASTRODUODENOSCOPY (EGD) WITH PROPOFOL;  Surgeon: Carol Ada, MD;  Location: [facility];  Service: Endoscopy;  Laterality: N/A;  . INSERTION OF DIALYSIS CATHETER Right 07/11/2014   Procedure: INSERTION OF DIALYSIS CATHETER IN RIGHT INTERNAL JUGULAR ;  Surgeon: Mal Misty, MD;  Location: [facility];  Service: Vascular;  Laterality: Right;  . LAPAROSCOPIC NEPHRECTOMY Right 07/25/2014   Procedure: RIGHT LAPAROSCOPIC RADICAL NEPHRECTOMY ;  Surgeon: Ardis Hughs, MD;  Location: [facility];  Service: Urology;  Laterality: Right;  . PATCH ANGIOPLASTY Right 07/11/2014   Procedure: PATCH ANGIOPLASTY OF RIGHT RADIAL ARTERY USING CEPHALIC VEIN.;  Surgeon: Mal Misty, MD;  Location: [facility];  Service: Vascular;  Laterality: Right;  . THROMBECTOMY W/ EMBOLECTOMY Right 07/11/2014  Procedure: THROMBECTOMY OF RIGHT RADIAL ARTERY  ;  Surgeon: Mal Misty, MD;  Location: [facility];  Service: Vascular;  Laterality: Right;   Past Medical History:  Diagnosis Date  . Anemia   . Bruises easily   . Dialysis patient ([place])   . ESRD on dialysis ([place])   . Hyperlipidemia   . Hypertension   . Renal disorder    rt renal mass / < functioning of left kidney - being prepared for possible dialysis  . Right renal mass    BP 139/80   Pulse 73   SpO2 95%   Opioid Risk Score:   Fall Risk  Score:  `1  Depression screen PHQ 2/9  No flowsheet data found.   Review of Systems  Constitutional: Positive for unexpected weight change.  HENT: Negative.   Eyes: Negative.   Respiratory: Negative.   Cardiovascular: Negative.   Gastrointestinal: Negative.   Endocrine: Negative.   Genitourinary: Negative.   Musculoskeletal: Negative.   Skin: Negative.   Allergic/Immunologic: Negative.   Neurological: Negative.   Hematological: Negative.   Psychiatric/Behavioral: Negative.   All other systems reviewed and are negative.      Objective:   Physical Exam  Constitutional: She is oriented to person, place, and time. She appears well-developed and well-nourished.  HENT:  Head: Normocephalic and atraumatic.  Eyes: Pupils are equal, round, and reactive to light. Conjunctivae and EOM are normal.  Neck: Normal range of motion.  Cardiovascular: Normal rate and regular rhythm.   No murmur heard. Pulmonary/Chest: Effort normal and breath sounds normal. No respiratory distress. She has no wheezes.  Abdominal: Soft. Bowel sounds are normal. She exhibits no distension. There is no tenderness.  Neurological: She is alert and oriented to person, place, and time. Gait abnormal.  Visual fields are intact confrontation, no evidence of nystagmus, no evidence of left neglect  Motor strength is 3 minus at the left bicep to mind this at the deltoid, 2 minus at the finger flexors and extensors 3 minus, left hip flexor, 4 minus. Knee extensor, 2 minus, ankle dorsiflexor, plantar flexor. Strength on the right side is 5/5 in the upper and lower limb  Ambulates with a left AFO and a cane. Has a tendency towards a foot drag, but no knee instability.  Psychiatric: She has a normal mood and affect.  Nursing note and vitals reviewed.         Assessment & Plan:  1. History of right frontal hemorrhage with resultant left hemiparesis. Her cognitive deficits have cleared up. She has no significant  left neglect. At the current time. She still has significant left upper and lower limb weakness. Recommend referral to outpatient PT, OT, her home health is finishing up. Line. No driving until reevaluation. Physical medicine rehabilitation follow-up in one month. Primary care follow-up this week. Neurology follow-up next month

## 2017-03-03 DIAGNOSIS — R05 Cough: Secondary | ICD-10-CM | POA: Diagnosis not present

## 2017-03-03 DIAGNOSIS — I10 Essential (primary) hypertension: Secondary | ICD-10-CM | POA: Diagnosis not present

## 2017-03-03 DIAGNOSIS — G8194 Hemiplegia, unspecified affecting left nondominant side: Secondary | ICD-10-CM | POA: Diagnosis not present

## 2017-03-03 DIAGNOSIS — R569 Unspecified convulsions: Secondary | ICD-10-CM | POA: Diagnosis not present

## 2017-03-03 DIAGNOSIS — I629 Nontraumatic intracranial hemorrhage, unspecified: Secondary | ICD-10-CM | POA: Diagnosis not present

## 2017-03-03 DIAGNOSIS — N186 End stage renal disease: Secondary | ICD-10-CM | POA: Diagnosis not present

## 2017-03-03 DIAGNOSIS — Z681 Body mass index (BMI) 19 or less, adult: Secondary | ICD-10-CM | POA: Diagnosis not present

## 2017-03-23 ENCOUNTER — Ambulatory Visit (HOSPITAL_BASED_OUTPATIENT_CLINIC_OR_DEPARTMENT_OTHER): Payer: BLUE CROSS/BLUE SHIELD | Admitting: Physical Medicine & Rehabilitation

## 2017-03-23 ENCOUNTER — Encounter: Payer: BLUE CROSS/BLUE SHIELD | Attending: Physical Medicine & Rehabilitation

## 2017-03-23 ENCOUNTER — Encounter: Payer: Self-pay | Admitting: Physical Medicine & Rehabilitation

## 2017-03-23 VITALS — BP 151/87 | HR 66

## 2017-03-23 DIAGNOSIS — E785 Hyperlipidemia, unspecified: Secondary | ICD-10-CM | POA: Diagnosis not present

## 2017-03-23 DIAGNOSIS — I69354 Hemiplegia and hemiparesis following cerebral infarction affecting left non-dominant side: Secondary | ICD-10-CM

## 2017-03-23 DIAGNOSIS — N186 End stage renal disease: Secondary | ICD-10-CM | POA: Insufficient documentation

## 2017-03-23 DIAGNOSIS — Z992 Dependence on renal dialysis: Secondary | ICD-10-CM | POA: Insufficient documentation

## 2017-03-23 DIAGNOSIS — I120 Hypertensive chronic kidney disease with stage 5 chronic kidney disease or end stage renal disease: Secondary | ICD-10-CM | POA: Diagnosis not present

## 2017-03-23 NOTE — Patient Instructions (Signed)
I would recommend an adapter for your turn signal to allow you to use her right arm to use the signal.  Otherwise, she should be okay for driving but would recommend the following precautions.  Graduated return to driving instructions were provided. It is recommended that the patient first drives with another licensed driver in an empty parking lot. If the patient does well with this, and they can drive on a quiet street with the licensed driver. If the patient does well with this they can drive on a busy street with a licensed driver. If the patient does well with this, the next time out they can go by himself. For the first month after resuming driving, I recommend no nighttime or Interstate driving.

## 2017-03-23 NOTE — Progress Notes (Signed)
Subjective:    Patient ID: Terry Juarez, female    DOB: 1965-10-29, 51 y.o.   MRN: [id number]    Pt has been doing more cooking, even started vacuuming a little Home health PT, OT finished last week PD at home, Patient can manage herself   Had a fall when she first was discharged, but no further Has not been driving Living with her brother and sister-in-law in [place]   HPI Seen at PCP office , Dr. Fanny Skates I with PD at home Currently living at her home in [place] but will be back in [place] next week. Her brother is with her.   Pain Inventory Average Pain 0 Pain Right Now 0 My pain is na  In the last 24 hours, has pain interfered with the following? General activity 0 Relation with others 0 Enjoyment of life 0 What TIME of day is your pain at its worst? na' Sleep (in general) Good  Pain is worse with: na Pain improves with: na Relief from Meds: na  Mobility walk with assistance use a cane  Function not employed: date last employed 2018  Neuro/Psych spasms  Prior Studies Any changes since last visit?  no  Physicians involved in your care Any changes since last visit?  no   Family History  Problem Relation Age of Onset  . Throat cancer Mother        smoked  . Hypertension Father    Social History   Social History  . Marital status: Widowed    Spouse name: N/A  . Number of children: N/A  . Years of education: N/A   Social History Main Topics  . Smoking status: Never Smoker  . Smokeless tobacco: Never Used  . Alcohol use 1.2 oz/week    2 Glasses of wine per week     Comment: occ  . Drug use: No  . Sexual activity: Yes   Other Topics Concern  . Not on file   Social History Narrative  . No narrative on file   Past Surgical History:  Procedure Laterality Date  . AV FISTULA PLACEMENT Right 07/11/2014   Procedure: ARTERIOVENOUS (AV) FISTULA CREATION RIGHT ARM BRACHIO-CEPHALIC WITH ATTEMPTED RADIO-CEPHALIC (AV) FISTULA;   Surgeon: Mal Misty, MD;  Location: [facility];  Service: Vascular;  Laterality: Right;  . COLONOSCOPY WITH PROPOFOL N/A 09/04/2016   Procedure: COLONOSCOPY WITH PROPOFOL;  Surgeon: Carol Ada, MD;  [facility];  Service: Endoscopy;  Laterality: N/A;  . ECTOPIC PREGNANCY SURGERY  1987  . ESOPHAGOGASTRODUODENOSCOPY N/A 07/30/2016   Procedure: ESOPHAGOGASTRODUODENOSCOPY (EGD);  Surgeon: Carol Ada, MD;  [facility] END[facility];  Service: Endoscopy;  Laterality: N/A;  Bedside  . ESOPHAGOGASTRODUODENOSCOPY (EGD) WITH PROPOFOL N/A 09/04/2016   Procedure: ESOPHAGOGASTRODUODENOSCOPY (EGD) WITH PROPOFOL;  Surgeon: Carol Ada, MD;  [facility];  Service: Endoscopy;  Laterality: N/A;  . INSERTION OF DIALYSIS CATHETER Right 07/11/2014   Procedure: INSERTION OF DIALYSIS CATHETER IN RIGHT INTERNAL JUGULAR ;  Surgeon: Mal Misty, MD;  Location: [facility];  Service: Vascular;  Laterality: Right;  . LAPAROSCOPIC NEPHRECTOMY Right 07/25/2014   Procedure: RIGHT LAPAROSCOPIC RADICAL NEPHRECTOMY ;  Surgeon: Ardis Hughs, MD;  [facility];  Service: Urology;  Laterality: Right;  . PATCH ANGIOPLASTY Right 07/11/2014   Procedure: PATCH ANGIOPLASTY OF RIGHT RADIAL ARTERY USING CEPHALIC VEIN.;  Surgeon: Mal Misty, MD;  Location: [facility];  Service: Vascular;  Laterality: Right;  . THROMBECTOMY W/ EMBOLECTOMY Right 07/11/2014  Procedure: THROMBECTOMY OF RIGHT RADIAL ARTERY  ;  Surgeon: Mal Misty, MD;  Location: [facility];  Service: Vascular;  Laterality: Right;   Past Medical History:  Diagnosis Date  . Anemia   . Bruises easily   . Dialysis patient ([place])   . ESRD on dialysis ([place])   . Hyperlipidemia   . Hypertension   . Renal disorder    rt renal mass / < functioning of left kidney - being prepared for possible dialysis  . Right renal mass    There were no vitals taken for this visit.  Opioid Risk Score:   Fall Risk Score:  `1  Depression screen PHQ 2/9  Depression screen PHQ  2/9 03/02/2017  Decreased Interest 0  Down, Depressed, Hopeless 0  PHQ - 2 Score 0  Altered sleeping 0  Tired, decreased energy 1  Change in appetite 0  Feeling bad or failure about yourself  0  Trouble concentrating 0  Moving slowly or fidgety/restless 0  Suicidal thoughts 0  PHQ-9 Score 1  Difficult doing work/chores Not difficult at all     Review of Systems  Constitutional: Negative.   HENT: Negative.   Eyes: Negative.   Respiratory: Negative.   Cardiovascular: Negative.   Gastrointestinal: Negative.   Endocrine: Negative.   Genitourinary: Negative.   Musculoskeletal: Negative.   Skin: Negative.   Allergic/Immunologic: Negative.   Neurological: Negative.   Hematological: Negative.   Psychiatric/Behavioral: Negative.   All other systems reviewed and are negative.      Objective:   Physical Exam  Constitutional: She is oriented to person, place, and time. She appears well-developed and well-nourished.  HENT:  Head: Normocephalic and atraumatic.  Eyes: Pupils are equal, round, and reactive to light. Conjunctivae and EOM are normal.  Neurological: She is alert and oriented to person, place, and time. Gait abnormal.  Motor strength is 2 minus at the left deltoid, bicep, finger flexors, 0 at the finger extensors. Tone is MAS 2 at the left elbow flexor and finger and wrist flexor. There is clonus at the fingers on the left side. 3 minus at the left hip flexor, 4 minus. Knee extensor, 3 minus. Ankle dorsiflexor on the left. 5/5 in the right deltoid, bicep, tricep, grip as well as hip flexor, knee extensor, ankle dorsi flexion.  Psychiatric: She has a normal mood and affect.  Nursing note and vitals reviewed.         Assessment & Plan:  1. Right frontal hemorrhage with resultant left hemiparesis. She has made an excellent functional recovery even though she still has significant left upper extremity greater than left lower extremity weakness. She is ready to  progress to outpatient therapy. However, she has no clinics in her area. She is planning to move from [place] to [place] in about a month. She will continue her home exercise program and then will be referred to [facility] neuro rehabilitation outpatient PT, OT at that time.  Follow-up with primary care Dr Osborne Casco Follow-up with Dr.Xu neurology, April 06 2017  Physical medicine and rehabilitation follow-up in one month

## 2017-03-30 ENCOUNTER — Ambulatory Visit: Payer: BLUE CROSS/BLUE SHIELD | Admitting: Physical Medicine & Rehabilitation

## 2017-04-03 ENCOUNTER — Other Ambulatory Visit: Payer: Self-pay | Admitting: Internal Medicine

## 2017-04-05 DIAGNOSIS — E440 Moderate protein-calorie malnutrition: Secondary | ICD-10-CM | POA: Diagnosis not present

## 2017-04-05 DIAGNOSIS — N186 End stage renal disease: Secondary | ICD-10-CM | POA: Diagnosis not present

## 2017-04-05 DIAGNOSIS — N2581 Secondary hyperparathyroidism of renal origin: Secondary | ICD-10-CM | POA: Diagnosis not present

## 2017-04-05 DIAGNOSIS — Z79899 Other long term (current) drug therapy: Secondary | ICD-10-CM | POA: Diagnosis not present

## 2017-04-05 DIAGNOSIS — Z5189 Encounter for other specified aftercare: Secondary | ICD-10-CM | POA: Diagnosis not present

## 2017-04-05 DIAGNOSIS — R17 Unspecified jaundice: Secondary | ICD-10-CM | POA: Diagnosis not present

## 2017-04-05 DIAGNOSIS — D509 Iron deficiency anemia, unspecified: Secondary | ICD-10-CM | POA: Diagnosis not present

## 2017-04-05 DIAGNOSIS — D631 Anemia in chronic kidney disease: Secondary | ICD-10-CM | POA: Diagnosis not present

## 2017-04-05 DIAGNOSIS — N2589 Other disorders resulting from impaired renal tubular function: Secondary | ICD-10-CM | POA: Diagnosis not present

## 2017-04-06 ENCOUNTER — Encounter: Payer: Self-pay | Admitting: Neurology

## 2017-04-06 ENCOUNTER — Ambulatory Visit (INDEPENDENT_AMBULATORY_CARE_PROVIDER_SITE_OTHER): Payer: Medicare Other | Admitting: Neurology

## 2017-04-06 VITALS — BP 133/86 | HR 65 | Ht 65.0 in | Wt 116.4 lb

## 2017-04-06 DIAGNOSIS — N2889 Other specified disorders of kidney and ureter: Secondary | ICD-10-CM

## 2017-04-06 DIAGNOSIS — D509 Iron deficiency anemia, unspecified: Secondary | ICD-10-CM | POA: Diagnosis not present

## 2017-04-06 DIAGNOSIS — Z992 Dependence on renal dialysis: Secondary | ICD-10-CM | POA: Diagnosis not present

## 2017-04-06 DIAGNOSIS — I629 Nontraumatic intracranial hemorrhage, unspecified: Secondary | ICD-10-CM | POA: Diagnosis not present

## 2017-04-06 DIAGNOSIS — I69154 Hemiplegia and hemiparesis following nontraumatic intracerebral hemorrhage affecting left non-dominant side: Secondary | ICD-10-CM | POA: Diagnosis not present

## 2017-04-06 DIAGNOSIS — N186 End stage renal disease: Secondary | ICD-10-CM | POA: Diagnosis not present

## 2017-04-06 DIAGNOSIS — I151 Hypertension secondary to other renal disorders: Secondary | ICD-10-CM

## 2017-04-06 DIAGNOSIS — D631 Anemia in chronic kidney disease: Secondary | ICD-10-CM | POA: Diagnosis not present

## 2017-04-06 DIAGNOSIS — N2581 Secondary hyperparathyroidism of renal origin: Secondary | ICD-10-CM | POA: Diagnosis not present

## 2017-04-06 DIAGNOSIS — R569 Unspecified convulsions: Secondary | ICD-10-CM

## 2017-04-06 DIAGNOSIS — E440 Moderate protein-calorie malnutrition: Secondary | ICD-10-CM | POA: Diagnosis not present

## 2017-04-06 DIAGNOSIS — Z79899 Other long term (current) drug therapy: Secondary | ICD-10-CM | POA: Diagnosis not present

## 2017-04-06 NOTE — Patient Instructions (Addendum)
-   start baby ASA 81mg  for stroke prevention. You can buy over the counter.  - continue keppra for seizure precaution.  - According to [place] law, you can not drive until seizure free for 6 months and under physician's care.  - Please maintain seizure precautions. Do not participate in activities where a loss of awareness could hurt you or someone else.  No swimming alone, no tub bathing, no hot tubs, no driving, no operating motorized vehicles(cars, ATVs, motorbikes, etc), lawnmowers or power tools.  No standing at heights, such as rooftops, ladders or stairs. No sliding boards, monkey bars or swings, or climbing trees.  Avoid hot objects such as stoves, heaters, open fires.  Wear a helmet when riding a bicycle, scooter, skateboard, etc. and avoid areas of traffic.  Set water heater to 120 degrees or less. When caring for infants or small children, sit down when holding, feeding, or changing them to minimize risk of injury to the child in the event you have a seizure. - check BP at home and record - continue dialysis at home and follow up with kidney doctor  - continue aggressive PT/OT to increase strength. Follow up with rehab Dr. Letta Pate. - renal diet and self exercise - follow up in 3 months.

## 2017-04-07 DIAGNOSIS — D509 Iron deficiency anemia, unspecified: Secondary | ICD-10-CM | POA: Diagnosis not present

## 2017-04-07 DIAGNOSIS — N186 End stage renal disease: Secondary | ICD-10-CM | POA: Diagnosis not present

## 2017-04-07 DIAGNOSIS — N2581 Secondary hyperparathyroidism of renal origin: Secondary | ICD-10-CM | POA: Diagnosis not present

## 2017-04-07 DIAGNOSIS — Z79899 Other long term (current) drug therapy: Secondary | ICD-10-CM | POA: Diagnosis not present

## 2017-04-07 DIAGNOSIS — E440 Moderate protein-calorie malnutrition: Secondary | ICD-10-CM | POA: Diagnosis not present

## 2017-04-07 DIAGNOSIS — D631 Anemia in chronic kidney disease: Secondary | ICD-10-CM | POA: Diagnosis not present

## 2017-04-07 NOTE — Progress Notes (Signed)
STROKE NEUROLOGY FOLLOW UP NOTE  NAME: Terry Juarez DOB: 01/06/1966  REASON FOR VISIT: stroke follow up HISTORY FROM: pt and chart  Today we had the pleasure of seeing Terry Juarez in follow-up at our Neurology Clinic. Pt was accompanied by no one.   History Summary Terry Juarez is a 51 y.o. female with history of a renal cell carcinoma s/p right nephrectomy, hypertension, end-stage renal disease on peritoneal dialysis, hyperlipidemia, and anemia admitted on 12/19/16 for seizure, left-sided weakness, and right frontal headache. CT head showed right posterior frontal lobe acute ICH. MRI head consistent with PRES. CTA unremarkable except FMD of bilateral cervical ICAs. Repeat head CT with stable hematoma. EF 60-65%. EEG diffuse slowing, no seizure activity, put on Keppra. BP eventually controlled with multiple PO meds. Attempted twice for HD, however, patient not able to tolerate, the second attempt even caused cardiac arrest s/p intubation. Switched back to PD. Pt condition gradually improved, but still has left hemiplegia. Discharge to CIR.  Interval History During the interval time, the patient has been doing well. Left hemiplegia much improved. Currently, patient able to walk with cane with left PFO brace. Still has left arm plegia. BP much improved, today 130/86, still on Keppra, no seizures. However, patient has been back to driving although less than 6 months of last seizure. Terry Juarez was told to restrain from driving until 6 months free from seizure. Terry Juarez expressed understanding and willing to do so. Has outpt PT/OT  REVIEW OF SYSTEMS: Full 14 system review of systems performed and notable only for those listed below and in HPI above, all others are negative:  Constitutional:   Cardiovascular: Leg swelling Ear/Nose/Throat:   Skin:  Eyes:   Respiratory:   Gastroitestinal:   Genitourinary:  Hematology/Lymphatic:   Endocrine:  Musculoskeletal:   Allergy/Immunology:     Neurological:  Dizziness Psychiatric:  Sleep: Daytime sleepiness  The following represents the patient's updated allergies and side effects list: Allergies  Allergen Reactions  . Lisinopril Cough    The neurologically relevant items on the patient's problem list were reviewed on today's visit.  Neurologic Examination  A problem focused neurological exam (12 or more points of the single system neurologic examination, vital signs counts as 1 point, cranial nerves count for 8 points) was performed.  Blood pressure 133/86, pulse 65, height 5\' 5"  (1.651 m), weight 116 lb 6.4 oz (52.8 kg).  General - Well nourished, well developed, in no apparent distress.  Ophthalmologic - Fundi not visualized due to noncooperation.  Cardiovascular - Regular rate and rhythm with no murmur.  Mental Status -  Level of arousal and orientation to time, place, and person were intact. Language including expression, naming, repetition, comprehension was assessed and found intact. Attention span and concentration were normal. Fund of Knowledge was assessed and was intact.  Cranial Nerves II - XII - II - Visual field intact OU. III, IV, VI - Extraocular movements intact. V - Facial sensation intact bilaterally. VII - left nasolabial fold flattening. VIII - Hearing & vestibular intact bilaterally. X - Palate elevates symmetrically. XI - Chin turning & shoulder shrug intact bilaterally. XII - Tongue protrusion intact.  Motor Strength - The patient's strength was normal in RUE and RLE, however, LUE 2/5 proximal and 0/5 distal. LLE 4+/5 proximal and on PFO brace. Bulk was normal and fasciculations were absent.   Motor Tone - Muscle tone was assessed at the neck and appendages and was increased on the left  Reflexes -  The patient's reflexes were 3+ in LUE and LLE and Terry Juarez had no pathological reflexes.  Sensory - Light touch, temperature/pinprick were assessed and were normal.    Coordination - The  patient had normal movements in the right hand with no ataxia or dysmetria.  Tremor was absent.  Gait and Station - walk with cane, left hemiparetic gait, no left arm swing   Functional score  mRS = 3   0 - No symptoms.   1 - No significant disability. Able to carry out all usual activities, despite some symptoms.   2 - Slight disability. Able to look after own affairs without assistance, but unable to carry out all previous activities.   3 - Moderate disability. Requires some help, but able to walk unassisted.   4 - Moderately severe disability. Unable to attend to own bodily needs without assistance, and unable to walk unassisted.   5 - Severe disability. Requires constant nursing care and attention, bedridden, incontinent.   6 - Dead.   NIH Stroke Scale   Level Of Consciousness 0=Alert; keenly responsive 1=Not alert, but arousable by minor stimulation 2=Not alert, requires repeated stimulation 3=Responds only with reflex movements 0  LOC Questions to Month and Age 54=Answers both questions correctly 1=Answers one question correctly 2=Answers neither question correctly 0  LOC Commands      -Open/Close eyes     -Open/close grip 0=Performs both tasks correctly 1=Performs one task correctly 2=Performs neighter task correctly 0  Best Gaze 0=Normal 1=Partial gaze palsy 2=Forced deviation, or total gaze paresis 0  Visual 0=No visual loss 1=Partial hemianopia 2=Complete hemianopia 3=Bilateral hemianopia (blind including cortical blindness) 0  Facial Palsy 0=Normal symmetrical movement 1=Minor paralysis (asymmetry) 2=Partial paralysis (lower face) 3=Complete paralysis (upper and lower face) 1  Motor  0=No drift, limb holds posture for full 10 seconds 1=Drift, limb holds posture, no drift to bed 2=Some antigravity effort, cannot maintain posture, drifts to bed 3=No effort against gravity, limb falls 4=No movement Right Arm 0     Leg 0    Left Arm 3     Leg 1  Limb  Ataxia 0=Absent 1=Present in one limb 2=Present in two limbs 0  Sensory 0=Normal 1=Mild to moderate sensory loss 2=Severe to total sensory loss 0  Best Language 0=No aphasia, normal 1=Mild to moderate aphasia 2=Mute, global aphasia 3=Mute, global aphasia 0  Dysarthria 0=Normal 1=Mild to moderate 2=Severe, unintelligible or mute/anarthric 0  Extinction/Neglect 0=No abnormality 1=Extinction to bilateral simultaneous stimulation 2=Profound neglect 0  Total   5     Data reviewed: I personally reviewed the images and agree with the radiology interpretations.  Ct Head Wo Contrast 12/24/2016 1. Stable right frontal hemorrhage with evolving surrounding edema. 2. Stable 3-4 mm of midline shift.  12/22/2016 IMPRESSION: Stable intraparenchymal hemorrhage. Subtle signs of midline shift, and slight increased pressure. But no significant worsening from priors.    12/22/2016 IMPRESSION: 1. No significant interval change in posterior right frontal intraparenchymal hematoma with extra-axial extension. Similar localized edema and mass effect with 3 mm right-to-left shift. 2. No other new acute intracranial process.   12/21/2016 1. Continued increase in size of right frontal parenchymal hemorrhage with extra-axial extension. 2. Slight increase in associated mass effect.  12/20/2016 1. Slightly increased size of right frontal intraparenchymal hematoma with unchanged right convexity subarachnoid blood, compared to head CTA performed 12/19/2016. Compared to the initial head CT from this presentation, obtained at 5:02 p.m. on 12/19/2016, the hematoma has increased in size.  2. No  new hemorrhage, hydrocephalus or mass effect.   12/19/2016 1. Right posterior frontal lobe acute hemorrhage measuring up to 29 mm with small area of surrounding edema and local mass effect. No significant midline shift or herniation. Follow-up to ensure resolution is recommended to exclude an underlying mass.  2. Small  volume of subarachnoid hemorrhage overlying the right frontal convexity.  3. Hypoattenuation within subcortical white matter and bilateral parietal and occipital lobes. Findings are suggestive of PRES in the setting of hypertension. This can be further characterized with MRI of the brain.   Ct Angio Head W Or Wo Contrast Ct Angio Neck W And/or Wo Contrast 12/19/2016 1. No vascular explanation for the right frontal parietal hematoma. Dural venous sinuses are patent and there is no evidence of vascular malformation.  2. The 16 cc hematoma is mildly increased from earlier CT. Negative for shift or herniation.  3. Fibromuscular dysplasia of the bilateral cervical ICA.  4. Moderate atherosclerosis for age. Negative for flow limiting stenosis.    Mr Brain Wo Contrast 12/19/2016 1. Findings of posterior reversible encephalopathy syndrome.  2. Size stable posterior right frontal hematoma compared to CTA 1 hour prior.    EEG 12/21/2016 This awake and asleepEEG is abnormal due to diffuse slowing of the waking background.  LE venous doppler - no DVT  Component     Latest Ref Rng & Units 12/19/2016 12/20/2016 12/22/2016  HIV Screen 4th Generation wRfx     Non Reactive Non Reactive    RPR     Non Reactive  Non Reactive   Homocysteine     0.0 - 15.0 umol/L  21.0 (H)   Vitamin B12     180 - 914 pg/mL  1,095 (H)   TSH     0.350 - 4.500 uIU/mL  15.736 (H)   ALT     14 - 54 U/L   14  Ammonia     9 - 35 umol/L   38 (H)     Assessment: As you may recall, Terry Juarez is a 51 y.o. African American female with PMH of a renal cell carcinoma s/p right nephrectomy, hypertension, end-stage renal disease on peritoneal dialysis, hyperlipidemia, and anemia admitted on 12/19/16 for  right frontal lobe acute ICH. MRI head consistent with PRES. CTA unremarkable except FMD of bilateral cervical ICAs. Repeat head CT with stable hematoma. EF 60-65%. EEG diffuse slowing, no seizure activity, put on Keppra. BP eventually  controlled with multiple PO meds. Attempted twice for HD, however, patient not able to tolerate, the second attempt even caused cardiac arrest. Switched back to PD, tolerating well. Discharge to CIR. During the interval time, left hemiplegia much improved. Currently, patient able to walk with cane with left PFO brace. Still has left arm plegia. BP much improved, today 130/86, on Keppra, no seizures. However, patient has been back to driving although less than 6 months of last seizure. Terry Juarez was told to restrain from driving until 6 months free from seizure. Terry Juarez expressed understanding and willing to do so.    Plan:  - start baby ASA 81mg  for stroke prevention. Buy over the counter.  - continue keppra for seizure precaution.  - According to [place] law, you can not drive until seizure free for 6 months and under physician's care.  - Please maintain seizure precautions.  - check BP at home and record - continue PD at home and follow up with nephrology - continue aggressive PT/OT to increase strength. Follow up with rehab Dr. Letta Pate. -  renal diet and self exercise - follow up in 3 months.   I spent more than 25 minutes of face to face time with the patient. Greater than 50% of time was spent in counseling and coordination of care. We discussed no driving until seizure free for 6 months, aggressive PT/OT, start baby aspirin.   No orders of the defined types were placed in this encounter.   Meds ordered this encounter  Medications  . aspirin EC 81 MG tablet    Sig: Take 1 tablet (81 mg total) by mouth daily.    Patient Instructions  - start baby ASA 81mg  for stroke prevention. You can buy over the counter.  - continue keppra for seizure precaution.  - According to [place] law, you can not drive until seizure free for 6 months and under physician's care.  - Please maintain seizure precautions. Do not participate in activities where a loss of awareness could hurt you or someone else.  No swimming alone,  no tub bathing, no hot tubs, no driving, no operating motorized vehicles(cars, ATVs, motorbikes, etc), lawnmowers or power tools.  No standing at heights, such as rooftops, ladders or stairs. No sliding boards, monkey bars or swings, or climbing trees.  Avoid hot objects such as stoves, heaters, open fires.  Wear a helmet when riding a bicycle, scooter, skateboard, etc. and avoid areas of traffic.  Set water heater to 120 degrees or less. When caring for infants or small children, sit down when holding, feeding, or changing them to minimize risk of injury to the child in the event you have a seizure. - check BP at home and record - continue dialysis at home and follow up with kidney doctor  - continue aggressive PT/OT to increase strength. Follow up with rehab Dr. Letta Pate. - renal diet and self exercise - follow up in 3 months.      Rosalin Hawking, MD PhD [facility] [address], [place], [place] [address] [phone]

## 2017-04-08 DIAGNOSIS — D631 Anemia in chronic kidney disease: Secondary | ICD-10-CM | POA: Diagnosis not present

## 2017-04-08 DIAGNOSIS — N2889 Other specified disorders of kidney and ureter: Secondary | ICD-10-CM

## 2017-04-08 DIAGNOSIS — Z79899 Other long term (current) drug therapy: Secondary | ICD-10-CM | POA: Diagnosis not present

## 2017-04-08 DIAGNOSIS — D509 Iron deficiency anemia, unspecified: Secondary | ICD-10-CM | POA: Diagnosis not present

## 2017-04-08 DIAGNOSIS — N186 End stage renal disease: Secondary | ICD-10-CM | POA: Diagnosis not present

## 2017-04-08 DIAGNOSIS — E440 Moderate protein-calorie malnutrition: Secondary | ICD-10-CM | POA: Diagnosis not present

## 2017-04-08 DIAGNOSIS — N2581 Secondary hyperparathyroidism of renal origin: Secondary | ICD-10-CM | POA: Diagnosis not present

## 2017-04-08 DIAGNOSIS — I151 Hypertension secondary to other renal disorders: Secondary | ICD-10-CM | POA: Insufficient documentation

## 2017-04-08 MED ORDER — ASPIRIN EC 81 MG PO TBEC: 81.0000 mg | DELAYED_RELEASE_TABLET | Freq: Every day | ORAL | Status: AC

## 2017-04-09 DIAGNOSIS — D509 Iron deficiency anemia, unspecified: Secondary | ICD-10-CM | POA: Diagnosis not present

## 2017-04-09 DIAGNOSIS — D631 Anemia in chronic kidney disease: Secondary | ICD-10-CM | POA: Diagnosis not present

## 2017-04-09 DIAGNOSIS — E440 Moderate protein-calorie malnutrition: Secondary | ICD-10-CM | POA: Diagnosis not present

## 2017-04-09 DIAGNOSIS — Z79899 Other long term (current) drug therapy: Secondary | ICD-10-CM | POA: Diagnosis not present

## 2017-04-09 DIAGNOSIS — N2581 Secondary hyperparathyroidism of renal origin: Secondary | ICD-10-CM | POA: Diagnosis not present

## 2017-04-09 DIAGNOSIS — N186 End stage renal disease: Secondary | ICD-10-CM | POA: Diagnosis not present

## 2017-04-10 DIAGNOSIS — N2581 Secondary hyperparathyroidism of renal origin: Secondary | ICD-10-CM | POA: Diagnosis not present

## 2017-04-10 DIAGNOSIS — D509 Iron deficiency anemia, unspecified: Secondary | ICD-10-CM | POA: Diagnosis not present

## 2017-04-10 DIAGNOSIS — Z79899 Other long term (current) drug therapy: Secondary | ICD-10-CM | POA: Diagnosis not present

## 2017-04-10 DIAGNOSIS — E440 Moderate protein-calorie malnutrition: Secondary | ICD-10-CM | POA: Diagnosis not present

## 2017-04-10 DIAGNOSIS — D631 Anemia in chronic kidney disease: Secondary | ICD-10-CM | POA: Diagnosis not present

## 2017-04-10 DIAGNOSIS — N186 End stage renal disease: Secondary | ICD-10-CM | POA: Diagnosis not present

## 2017-04-11 DIAGNOSIS — N2581 Secondary hyperparathyroidism of renal origin: Secondary | ICD-10-CM | POA: Diagnosis not present

## 2017-04-11 DIAGNOSIS — E440 Moderate protein-calorie malnutrition: Secondary | ICD-10-CM | POA: Diagnosis not present

## 2017-04-11 DIAGNOSIS — Z79899 Other long term (current) drug therapy: Secondary | ICD-10-CM | POA: Diagnosis not present

## 2017-04-11 DIAGNOSIS — N186 End stage renal disease: Secondary | ICD-10-CM | POA: Diagnosis not present

## 2017-04-11 DIAGNOSIS — D631 Anemia in chronic kidney disease: Secondary | ICD-10-CM | POA: Diagnosis not present

## 2017-04-11 DIAGNOSIS — D509 Iron deficiency anemia, unspecified: Secondary | ICD-10-CM | POA: Diagnosis not present

## 2017-04-12 DIAGNOSIS — Z79899 Other long term (current) drug therapy: Secondary | ICD-10-CM | POA: Diagnosis not present

## 2017-04-12 DIAGNOSIS — D509 Iron deficiency anemia, unspecified: Secondary | ICD-10-CM | POA: Diagnosis not present

## 2017-04-12 DIAGNOSIS — D631 Anemia in chronic kidney disease: Secondary | ICD-10-CM | POA: Diagnosis not present

## 2017-04-12 DIAGNOSIS — N186 End stage renal disease: Secondary | ICD-10-CM | POA: Diagnosis not present

## 2017-04-12 DIAGNOSIS — N2581 Secondary hyperparathyroidism of renal origin: Secondary | ICD-10-CM | POA: Diagnosis not present

## 2017-04-12 DIAGNOSIS — E440 Moderate protein-calorie malnutrition: Secondary | ICD-10-CM | POA: Diagnosis not present

## 2017-04-13 DIAGNOSIS — E440 Moderate protein-calorie malnutrition: Secondary | ICD-10-CM | POA: Diagnosis not present

## 2017-04-13 DIAGNOSIS — D631 Anemia in chronic kidney disease: Secondary | ICD-10-CM | POA: Diagnosis not present

## 2017-04-13 DIAGNOSIS — D509 Iron deficiency anemia, unspecified: Secondary | ICD-10-CM | POA: Diagnosis not present

## 2017-04-13 DIAGNOSIS — N2581 Secondary hyperparathyroidism of renal origin: Secondary | ICD-10-CM | POA: Diagnosis not present

## 2017-04-13 DIAGNOSIS — N186 End stage renal disease: Secondary | ICD-10-CM | POA: Diagnosis not present

## 2017-04-13 DIAGNOSIS — Z79899 Other long term (current) drug therapy: Secondary | ICD-10-CM | POA: Diagnosis not present

## 2017-04-14 DIAGNOSIS — D631 Anemia in chronic kidney disease: Secondary | ICD-10-CM | POA: Diagnosis not present

## 2017-04-14 DIAGNOSIS — E440 Moderate protein-calorie malnutrition: Secondary | ICD-10-CM | POA: Diagnosis not present

## 2017-04-14 DIAGNOSIS — D509 Iron deficiency anemia, unspecified: Secondary | ICD-10-CM | POA: Diagnosis not present

## 2017-04-14 DIAGNOSIS — N2581 Secondary hyperparathyroidism of renal origin: Secondary | ICD-10-CM | POA: Diagnosis not present

## 2017-04-14 DIAGNOSIS — Z79899 Other long term (current) drug therapy: Secondary | ICD-10-CM | POA: Diagnosis not present

## 2017-04-14 DIAGNOSIS — E890 Postprocedural hypothyroidism: Secondary | ICD-10-CM | POA: Diagnosis not present

## 2017-04-14 DIAGNOSIS — N186 End stage renal disease: Secondary | ICD-10-CM | POA: Diagnosis not present

## 2017-04-14 DIAGNOSIS — E7849 Other hyperlipidemia: Secondary | ICD-10-CM | POA: Diagnosis not present

## 2017-04-15 DIAGNOSIS — N186 End stage renal disease: Secondary | ICD-10-CM | POA: Diagnosis not present

## 2017-04-15 DIAGNOSIS — Z79899 Other long term (current) drug therapy: Secondary | ICD-10-CM | POA: Diagnosis not present

## 2017-04-15 DIAGNOSIS — N2581 Secondary hyperparathyroidism of renal origin: Secondary | ICD-10-CM | POA: Diagnosis not present

## 2017-04-15 DIAGNOSIS — E440 Moderate protein-calorie malnutrition: Secondary | ICD-10-CM | POA: Diagnosis not present

## 2017-04-15 DIAGNOSIS — D509 Iron deficiency anemia, unspecified: Secondary | ICD-10-CM | POA: Diagnosis not present

## 2017-04-15 DIAGNOSIS — D631 Anemia in chronic kidney disease: Secondary | ICD-10-CM | POA: Diagnosis not present

## 2017-04-16 DIAGNOSIS — D631 Anemia in chronic kidney disease: Secondary | ICD-10-CM | POA: Diagnosis not present

## 2017-04-16 DIAGNOSIS — N186 End stage renal disease: Secondary | ICD-10-CM | POA: Diagnosis not present

## 2017-04-16 DIAGNOSIS — N2581 Secondary hyperparathyroidism of renal origin: Secondary | ICD-10-CM | POA: Diagnosis not present

## 2017-04-16 DIAGNOSIS — D509 Iron deficiency anemia, unspecified: Secondary | ICD-10-CM | POA: Diagnosis not present

## 2017-04-16 DIAGNOSIS — E440 Moderate protein-calorie malnutrition: Secondary | ICD-10-CM | POA: Diagnosis not present

## 2017-04-16 DIAGNOSIS — Z79899 Other long term (current) drug therapy: Secondary | ICD-10-CM | POA: Diagnosis not present

## 2017-04-17 DIAGNOSIS — E440 Moderate protein-calorie malnutrition: Secondary | ICD-10-CM | POA: Diagnosis not present

## 2017-04-17 DIAGNOSIS — N186 End stage renal disease: Secondary | ICD-10-CM | POA: Diagnosis not present

## 2017-04-17 DIAGNOSIS — D509 Iron deficiency anemia, unspecified: Secondary | ICD-10-CM | POA: Diagnosis not present

## 2017-04-17 DIAGNOSIS — N2581 Secondary hyperparathyroidism of renal origin: Secondary | ICD-10-CM | POA: Diagnosis not present

## 2017-04-17 DIAGNOSIS — D631 Anemia in chronic kidney disease: Secondary | ICD-10-CM | POA: Diagnosis not present

## 2017-04-17 DIAGNOSIS — Z79899 Other long term (current) drug therapy: Secondary | ICD-10-CM | POA: Diagnosis not present

## 2017-04-18 DIAGNOSIS — N2581 Secondary hyperparathyroidism of renal origin: Secondary | ICD-10-CM | POA: Diagnosis not present

## 2017-04-18 DIAGNOSIS — Z79899 Other long term (current) drug therapy: Secondary | ICD-10-CM | POA: Diagnosis not present

## 2017-04-18 DIAGNOSIS — N186 End stage renal disease: Secondary | ICD-10-CM | POA: Diagnosis not present

## 2017-04-18 DIAGNOSIS — D631 Anemia in chronic kidney disease: Secondary | ICD-10-CM | POA: Diagnosis not present

## 2017-04-18 DIAGNOSIS — D509 Iron deficiency anemia, unspecified: Secondary | ICD-10-CM | POA: Diagnosis not present

## 2017-04-18 DIAGNOSIS — E440 Moderate protein-calorie malnutrition: Secondary | ICD-10-CM | POA: Diagnosis not present

## 2017-04-19 DIAGNOSIS — E440 Moderate protein-calorie malnutrition: Secondary | ICD-10-CM | POA: Diagnosis not present

## 2017-04-19 DIAGNOSIS — D631 Anemia in chronic kidney disease: Secondary | ICD-10-CM | POA: Diagnosis not present

## 2017-04-19 DIAGNOSIS — Z79899 Other long term (current) drug therapy: Secondary | ICD-10-CM | POA: Diagnosis not present

## 2017-04-19 DIAGNOSIS — N186 End stage renal disease: Secondary | ICD-10-CM | POA: Diagnosis not present

## 2017-04-19 DIAGNOSIS — D509 Iron deficiency anemia, unspecified: Secondary | ICD-10-CM | POA: Diagnosis not present

## 2017-04-19 DIAGNOSIS — N2581 Secondary hyperparathyroidism of renal origin: Secondary | ICD-10-CM | POA: Diagnosis not present

## 2017-04-20 ENCOUNTER — Ambulatory Visit (HOSPITAL_BASED_OUTPATIENT_CLINIC_OR_DEPARTMENT_OTHER): Payer: Medicare Other | Admitting: Physical Medicine & Rehabilitation

## 2017-04-20 ENCOUNTER — Encounter: Payer: Medicare Other | Attending: Physical Medicine & Rehabilitation

## 2017-04-20 ENCOUNTER — Encounter: Payer: Self-pay | Admitting: Physical Medicine & Rehabilitation

## 2017-04-20 VITALS — BP 126/81 | HR 68

## 2017-04-20 DIAGNOSIS — I69354 Hemiplegia and hemiparesis following cerebral infarction affecting left non-dominant side: Secondary | ICD-10-CM | POA: Insufficient documentation

## 2017-04-20 DIAGNOSIS — E440 Moderate protein-calorie malnutrition: Secondary | ICD-10-CM | POA: Diagnosis not present

## 2017-04-20 DIAGNOSIS — I120 Hypertensive chronic kidney disease with stage 5 chronic kidney disease or end stage renal disease: Secondary | ICD-10-CM | POA: Insufficient documentation

## 2017-04-20 DIAGNOSIS — D509 Iron deficiency anemia, unspecified: Secondary | ICD-10-CM | POA: Diagnosis not present

## 2017-04-20 DIAGNOSIS — Z992 Dependence on renal dialysis: Secondary | ICD-10-CM | POA: Diagnosis not present

## 2017-04-20 DIAGNOSIS — E785 Hyperlipidemia, unspecified: Secondary | ICD-10-CM | POA: Diagnosis not present

## 2017-04-20 DIAGNOSIS — Z79899 Other long term (current) drug therapy: Secondary | ICD-10-CM | POA: Diagnosis not present

## 2017-04-20 DIAGNOSIS — G8114 Spastic hemiplegia affecting left nondominant side: Secondary | ICD-10-CM | POA: Diagnosis not present

## 2017-04-20 DIAGNOSIS — N186 End stage renal disease: Secondary | ICD-10-CM | POA: Insufficient documentation

## 2017-04-20 DIAGNOSIS — N2581 Secondary hyperparathyroidism of renal origin: Secondary | ICD-10-CM | POA: Diagnosis not present

## 2017-04-20 DIAGNOSIS — D631 Anemia in chronic kidney disease: Secondary | ICD-10-CM | POA: Diagnosis not present

## 2017-04-20 NOTE — Progress Notes (Signed)
Subjective:    Patient ID: Terry Juarez, female    DOB: 1965/10/27, 51 y.o.   MRN: [id number]  HPI Last seizure 6/16 Patient is living in [place]. Has an appointment with her primary care in December Has seen neurology.  Remains independent with all self-care and mobility, uses left arm sling as well as cane, does not use AFO. Has not started outpatient therapy, completed home health therapy in [place], [place], but is back in her own home  We discussed no driving until cleared by neurology Pain Inventory Average Pain 2 Pain Right Now 0 My pain is aching  In the last 24 hours, has pain interfered with the following? General activity 0 Relation with others 0 Enjoyment of life 0 What TIME of day is your pain at its worst? evening Sleep (in general) Fair  Pain is worse with: bending Pain improves with: no selection Relief from Meds: 0  Mobility walk with assistance use a cane ability to climb steps?  yes do you drive?  yes Do you have any goals in this area?  no  Function not employed: date last employed 12/18/2016 disabled: date disabled 12/19/2016 I need assistance with the following:  household duties  Neuro/Psych dizziness  Prior Studies Any changes since last visit?  no  Physicians involved in your care Any changes since last visit?  no   Family History  Problem Relation Age of Onset  . Throat cancer Mother        smoked  . Hypertension Father    Social History   Social History  . Marital status: Widowed    Spouse name: N/A  . Number of children: N/A  . Years of education: N/A   Social History Main Topics  . Smoking status: Never Smoker  . Smokeless tobacco: Never Used  . Alcohol use 1.2 oz/week    2 Glasses of wine per week     Comment: occ  . Drug use: No  . Sexual activity: Yes   Other Topics Concern  . None   Social History Narrative  . None   Past Surgical History:  Procedure Laterality Date  . AV FISTULA  PLACEMENT Right 07/11/2014   Procedure: ARTERIOVENOUS (AV) FISTULA CREATION RIGHT ARM BRACHIO-CEPHALIC WITH ATTEMPTED RADIO-CEPHALIC (AV) FISTULA;  Surgeon: Mal Misty, MD;  Location: [facility];  Service: Vascular;  Laterality: Right;  . COLONOSCOPY WITH PROPOFOL N/A 09/04/2016   Procedure: COLONOSCOPY WITH PROPOFOL;  Surgeon: Carol Ada, MD;  Location: [facility];  Service: Endoscopy;  Laterality: N/A;  . ECTOPIC PREGNANCY SURGERY  1987  . ESOPHAGOGASTRODUODENOSCOPY N/A 07/30/2016   Procedure: ESOPHAGOGASTRODUODENOSCOPY (EGD);  Surgeon: Carol Ada, MD;  Location[facility];  Service: Endoscopy;  Laterality: N/A;  Bedside  . ESOPHAGOGASTRODUODENOSCOPY (EGD) WITH PROPOFOL N/A 09/04/2016   Procedure: ESOPHAGOGASTRODUODENOSCOPY (EGD) WITH PROPOFOL;  Surgeon: Carol Ada, MD;  Location: [facility];  Service: Endoscopy;  Laterality: N/A;  . INSERTION OF DIALYSIS CATHETER Right 07/11/2014   Procedure: INSERTION OF DIALYSIS CATHETER IN RIGHT INTERNAL JUGULAR ;  Surgeon: Mal Misty, MD;  Location: [facility];  Service: Vascular;  Laterality: Right;  . LAPAROSCOPIC NEPHRECTOMY Right 07/25/2014   Procedure: RIGHT LAPAROSCOPIC RADICAL NEPHRECTOMY ;  Surgeon: Ardis Hughs, MD;  Location: [facility];  Service: Urology;  Laterality: Right;  . PATCH ANGIOPLASTY Right 07/11/2014   Procedure: PATCH ANGIOPLASTY OF RIGHT RADIAL ARTERY USING CEPHALIC VEIN.;  Surgeon: Mal Misty, MD;  Location: [facility];  Service: Vascular;  Laterality: Right;  .  THROMBECTOMY W/ EMBOLECTOMY Right 07/11/2014   Procedure: THROMBECTOMY OF RIGHT RADIAL ARTERY  ;  Surgeon: Mal Misty, MD;  Location: [facility];  Service: Vascular;  Laterality: Right;   Past Medical History:  Diagnosis Date  . Anemia   . Bruises easily   . Dialysis patient ([place])   . ESRD on dialysis ([place])   . Hyperlipidemia   . Hypertension   . Renal disorder    rt renal mass / < functioning of left kidney - being prepared for possible dialysis  . Right renal mass     . Stroke ([place])    BP 126/81 (BP Location: Left Arm, Patient Position: Sitting, Cuff Size: Normal)   Pulse 68   SpO2 97%   Opioid Risk Score:   Fall Risk Score:  `1  Depression screen PHQ 2/9  Depression screen PHQ 2/9 03/02/2017  Decreased Interest 0  Down, Depressed, Hopeless 0  PHQ - 2 Score 0  Altered sleeping 0  Tired, decreased energy 1  Change in appetite 0  Feeling bad or failure about yourself  0  Trouble concentrating 0  Moving slowly or fidgety/restless 0  Suicidal thoughts 0  PHQ-9 Score 1  Difficult doing work/chores Not difficult at all    Review of Systems  Constitutional: Positive for unexpected weight change.  HENT: Negative.   Eyes: Negative.   Respiratory: Negative.   Cardiovascular: Negative.   Gastrointestinal: Negative.   Endocrine: Negative.   Genitourinary: Negative.   Musculoskeletal: Positive for gait problem.  Skin: Negative.   Allergic/Immunologic: Negative.   Neurological: Positive for dizziness.  Hematological: Negative.   Psychiatric/Behavioral: Negative.        Objective:   Physical Exam  Constitutional: She is oriented to person, place, and time. She appears well-developed and well-nourished. No distress.  HENT:  Head: Normocephalic and atraumatic.  Eyes: Pupils are equal, round, and reactive to light. Conjunctivae and EOM are normal.  Musculoskeletal:  Edema dorsum of left hand. No tenderness over the MCPs, no erythema, no synovitis in the finger or wrist joints  Neurological: She is alert and oriented to person, place, and time.  Tone: Ashworth grade 3 in the left finger and wrist flexors. Ashworth 1 at the elbow flexor Motor strength is 3 minus at the finger flexors 2 minus at the finger extensors 2 minus at the elbow flexors 4 minus at the left hip flexor, knee extensor, ankle dorsiflexor. Gait has a tendency towards left foot drop but is able to clear her toe No evidence of knee instability, uses small base quad cane   Skin: Skin is warm and dry. She is not diaphoretic.  Psychiatric: She has a normal mood and affect. Her behavior is normal. Thought content normal.  Nursing note and vitals reviewed.         Assessment & Plan:  1. Right frontal hemorrhage with onset seizure, left upper limb greater than lower limb weakness, spastic hemiparesis. Increasing spasticity, left finger flexors inhibiting flexion and extension. Recommend Botox injection 200 units. FCR 50 units, FCU 25 units. FDS 50 units. FDP 50 units, FDL 25 units  Have written orders for outpatient PT, OT  No driving until reevaluated by neurology

## 2017-04-20 NOTE — Patient Instructions (Signed)
Please review the information on the Botox  You should receive a call from outpatient therapy. 2 scheduled appointments

## 2017-04-21 DIAGNOSIS — D631 Anemia in chronic kidney disease: Secondary | ICD-10-CM | POA: Diagnosis not present

## 2017-04-21 DIAGNOSIS — D509 Iron deficiency anemia, unspecified: Secondary | ICD-10-CM | POA: Diagnosis not present

## 2017-04-21 DIAGNOSIS — E440 Moderate protein-calorie malnutrition: Secondary | ICD-10-CM | POA: Diagnosis not present

## 2017-04-21 DIAGNOSIS — N2581 Secondary hyperparathyroidism of renal origin: Secondary | ICD-10-CM | POA: Diagnosis not present

## 2017-04-21 DIAGNOSIS — N186 End stage renal disease: Secondary | ICD-10-CM | POA: Diagnosis not present

## 2017-04-21 DIAGNOSIS — Z79899 Other long term (current) drug therapy: Secondary | ICD-10-CM | POA: Diagnosis not present

## 2017-04-22 DIAGNOSIS — E440 Moderate protein-calorie malnutrition: Secondary | ICD-10-CM | POA: Diagnosis not present

## 2017-04-22 DIAGNOSIS — Z79899 Other long term (current) drug therapy: Secondary | ICD-10-CM | POA: Diagnosis not present

## 2017-04-22 DIAGNOSIS — D631 Anemia in chronic kidney disease: Secondary | ICD-10-CM | POA: Diagnosis not present

## 2017-04-22 DIAGNOSIS — D509 Iron deficiency anemia, unspecified: Secondary | ICD-10-CM | POA: Diagnosis not present

## 2017-04-22 DIAGNOSIS — N186 End stage renal disease: Secondary | ICD-10-CM | POA: Diagnosis not present

## 2017-04-22 DIAGNOSIS — N2581 Secondary hyperparathyroidism of renal origin: Secondary | ICD-10-CM | POA: Diagnosis not present

## 2017-04-23 DIAGNOSIS — N186 End stage renal disease: Secondary | ICD-10-CM | POA: Diagnosis not present

## 2017-04-23 DIAGNOSIS — N2581 Secondary hyperparathyroidism of renal origin: Secondary | ICD-10-CM | POA: Diagnosis not present

## 2017-04-23 DIAGNOSIS — E440 Moderate protein-calorie malnutrition: Secondary | ICD-10-CM | POA: Diagnosis not present

## 2017-04-23 DIAGNOSIS — D509 Iron deficiency anemia, unspecified: Secondary | ICD-10-CM | POA: Diagnosis not present

## 2017-04-23 DIAGNOSIS — Z79899 Other long term (current) drug therapy: Secondary | ICD-10-CM | POA: Diagnosis not present

## 2017-04-23 DIAGNOSIS — D631 Anemia in chronic kidney disease: Secondary | ICD-10-CM | POA: Diagnosis not present

## 2017-04-24 DIAGNOSIS — D509 Iron deficiency anemia, unspecified: Secondary | ICD-10-CM | POA: Diagnosis not present

## 2017-04-24 DIAGNOSIS — N2581 Secondary hyperparathyroidism of renal origin: Secondary | ICD-10-CM | POA: Diagnosis not present

## 2017-04-24 DIAGNOSIS — N186 End stage renal disease: Secondary | ICD-10-CM | POA: Diagnosis not present

## 2017-04-24 DIAGNOSIS — D631 Anemia in chronic kidney disease: Secondary | ICD-10-CM | POA: Diagnosis not present

## 2017-04-24 DIAGNOSIS — Z79899 Other long term (current) drug therapy: Secondary | ICD-10-CM | POA: Diagnosis not present

## 2017-04-24 DIAGNOSIS — E440 Moderate protein-calorie malnutrition: Secondary | ICD-10-CM | POA: Diagnosis not present

## 2017-04-25 DIAGNOSIS — D631 Anemia in chronic kidney disease: Secondary | ICD-10-CM | POA: Diagnosis not present

## 2017-04-25 DIAGNOSIS — N2581 Secondary hyperparathyroidism of renal origin: Secondary | ICD-10-CM | POA: Diagnosis not present

## 2017-04-25 DIAGNOSIS — E440 Moderate protein-calorie malnutrition: Secondary | ICD-10-CM | POA: Diagnosis not present

## 2017-04-25 DIAGNOSIS — N186 End stage renal disease: Secondary | ICD-10-CM | POA: Diagnosis not present

## 2017-04-25 DIAGNOSIS — D509 Iron deficiency anemia, unspecified: Secondary | ICD-10-CM | POA: Diagnosis not present

## 2017-04-25 DIAGNOSIS — Z79899 Other long term (current) drug therapy: Secondary | ICD-10-CM | POA: Diagnosis not present

## 2017-04-26 DIAGNOSIS — N2581 Secondary hyperparathyroidism of renal origin: Secondary | ICD-10-CM | POA: Diagnosis not present

## 2017-04-26 DIAGNOSIS — Z79899 Other long term (current) drug therapy: Secondary | ICD-10-CM | POA: Diagnosis not present

## 2017-04-26 DIAGNOSIS — E440 Moderate protein-calorie malnutrition: Secondary | ICD-10-CM | POA: Diagnosis not present

## 2017-04-26 DIAGNOSIS — D509 Iron deficiency anemia, unspecified: Secondary | ICD-10-CM | POA: Diagnosis not present

## 2017-04-26 DIAGNOSIS — N186 End stage renal disease: Secondary | ICD-10-CM | POA: Diagnosis not present

## 2017-04-26 DIAGNOSIS — D631 Anemia in chronic kidney disease: Secondary | ICD-10-CM | POA: Diagnosis not present

## 2017-04-27 DIAGNOSIS — D631 Anemia in chronic kidney disease: Secondary | ICD-10-CM | POA: Diagnosis not present

## 2017-04-27 DIAGNOSIS — D509 Iron deficiency anemia, unspecified: Secondary | ICD-10-CM | POA: Diagnosis not present

## 2017-04-27 DIAGNOSIS — N186 End stage renal disease: Secondary | ICD-10-CM | POA: Diagnosis not present

## 2017-04-27 DIAGNOSIS — E440 Moderate protein-calorie malnutrition: Secondary | ICD-10-CM | POA: Diagnosis not present

## 2017-04-27 DIAGNOSIS — N2581 Secondary hyperparathyroidism of renal origin: Secondary | ICD-10-CM | POA: Diagnosis not present

## 2017-04-27 DIAGNOSIS — Z79899 Other long term (current) drug therapy: Secondary | ICD-10-CM | POA: Diagnosis not present

## 2017-04-28 DIAGNOSIS — Z79899 Other long term (current) drug therapy: Secondary | ICD-10-CM | POA: Diagnosis not present

## 2017-04-28 DIAGNOSIS — E440 Moderate protein-calorie malnutrition: Secondary | ICD-10-CM | POA: Diagnosis not present

## 2017-04-28 DIAGNOSIS — N2581 Secondary hyperparathyroidism of renal origin: Secondary | ICD-10-CM | POA: Diagnosis not present

## 2017-04-28 DIAGNOSIS — D631 Anemia in chronic kidney disease: Secondary | ICD-10-CM | POA: Diagnosis not present

## 2017-04-28 DIAGNOSIS — D509 Iron deficiency anemia, unspecified: Secondary | ICD-10-CM | POA: Diagnosis not present

## 2017-04-28 DIAGNOSIS — N186 End stage renal disease: Secondary | ICD-10-CM | POA: Diagnosis not present

## 2017-04-29 DIAGNOSIS — E440 Moderate protein-calorie malnutrition: Secondary | ICD-10-CM | POA: Diagnosis not present

## 2017-04-29 DIAGNOSIS — D631 Anemia in chronic kidney disease: Secondary | ICD-10-CM | POA: Diagnosis not present

## 2017-04-29 DIAGNOSIS — N186 End stage renal disease: Secondary | ICD-10-CM | POA: Diagnosis not present

## 2017-04-29 DIAGNOSIS — Z79899 Other long term (current) drug therapy: Secondary | ICD-10-CM | POA: Diagnosis not present

## 2017-04-29 DIAGNOSIS — N2581 Secondary hyperparathyroidism of renal origin: Secondary | ICD-10-CM | POA: Diagnosis not present

## 2017-04-29 DIAGNOSIS — D509 Iron deficiency anemia, unspecified: Secondary | ICD-10-CM | POA: Diagnosis not present

## 2017-04-30 DIAGNOSIS — N2581 Secondary hyperparathyroidism of renal origin: Secondary | ICD-10-CM | POA: Diagnosis not present

## 2017-04-30 DIAGNOSIS — D509 Iron deficiency anemia, unspecified: Secondary | ICD-10-CM | POA: Diagnosis not present

## 2017-04-30 DIAGNOSIS — E440 Moderate protein-calorie malnutrition: Secondary | ICD-10-CM | POA: Diagnosis not present

## 2017-04-30 DIAGNOSIS — N186 End stage renal disease: Secondary | ICD-10-CM | POA: Diagnosis not present

## 2017-04-30 DIAGNOSIS — D631 Anemia in chronic kidney disease: Secondary | ICD-10-CM | POA: Diagnosis not present

## 2017-04-30 DIAGNOSIS — Z79899 Other long term (current) drug therapy: Secondary | ICD-10-CM | POA: Diagnosis not present

## 2017-05-01 DIAGNOSIS — E440 Moderate protein-calorie malnutrition: Secondary | ICD-10-CM | POA: Diagnosis not present

## 2017-05-01 DIAGNOSIS — D631 Anemia in chronic kidney disease: Secondary | ICD-10-CM | POA: Diagnosis not present

## 2017-05-01 DIAGNOSIS — Z79899 Other long term (current) drug therapy: Secondary | ICD-10-CM | POA: Diagnosis not present

## 2017-05-01 DIAGNOSIS — D509 Iron deficiency anemia, unspecified: Secondary | ICD-10-CM | POA: Diagnosis not present

## 2017-05-01 DIAGNOSIS — N186 End stage renal disease: Secondary | ICD-10-CM | POA: Diagnosis not present

## 2017-05-01 DIAGNOSIS — N2581 Secondary hyperparathyroidism of renal origin: Secondary | ICD-10-CM | POA: Diagnosis not present

## 2017-05-02 DIAGNOSIS — N186 End stage renal disease: Secondary | ICD-10-CM | POA: Diagnosis not present

## 2017-05-02 DIAGNOSIS — D509 Iron deficiency anemia, unspecified: Secondary | ICD-10-CM | POA: Diagnosis not present

## 2017-05-02 DIAGNOSIS — Z79899 Other long term (current) drug therapy: Secondary | ICD-10-CM | POA: Diagnosis not present

## 2017-05-02 DIAGNOSIS — N2581 Secondary hyperparathyroidism of renal origin: Secondary | ICD-10-CM | POA: Diagnosis not present

## 2017-05-02 DIAGNOSIS — E440 Moderate protein-calorie malnutrition: Secondary | ICD-10-CM | POA: Diagnosis not present

## 2017-05-02 DIAGNOSIS — D631 Anemia in chronic kidney disease: Secondary | ICD-10-CM | POA: Diagnosis not present

## 2017-05-03 DIAGNOSIS — N2581 Secondary hyperparathyroidism of renal origin: Secondary | ICD-10-CM | POA: Diagnosis not present

## 2017-05-03 DIAGNOSIS — Z79899 Other long term (current) drug therapy: Secondary | ICD-10-CM | POA: Diagnosis not present

## 2017-05-03 DIAGNOSIS — D631 Anemia in chronic kidney disease: Secondary | ICD-10-CM | POA: Diagnosis not present

## 2017-05-03 DIAGNOSIS — D509 Iron deficiency anemia, unspecified: Secondary | ICD-10-CM | POA: Diagnosis not present

## 2017-05-03 DIAGNOSIS — N186 End stage renal disease: Secondary | ICD-10-CM | POA: Diagnosis not present

## 2017-05-03 DIAGNOSIS — E440 Moderate protein-calorie malnutrition: Secondary | ICD-10-CM | POA: Diagnosis not present

## 2017-05-04 ENCOUNTER — Ambulatory Visit (HOSPITAL_BASED_OUTPATIENT_CLINIC_OR_DEPARTMENT_OTHER): Payer: Medicare Other | Admitting: Physical Medicine & Rehabilitation

## 2017-05-04 ENCOUNTER — Encounter: Payer: Self-pay | Admitting: Physical Medicine & Rehabilitation

## 2017-05-04 VITALS — BP 130/85 | HR 65 | Resp 14

## 2017-05-04 DIAGNOSIS — E785 Hyperlipidemia, unspecified: Secondary | ICD-10-CM | POA: Diagnosis not present

## 2017-05-04 DIAGNOSIS — G8114 Spastic hemiplegia affecting left nondominant side: Secondary | ICD-10-CM | POA: Diagnosis not present

## 2017-05-04 DIAGNOSIS — D631 Anemia in chronic kidney disease: Secondary | ICD-10-CM | POA: Diagnosis not present

## 2017-05-04 DIAGNOSIS — N186 End stage renal disease: Secondary | ICD-10-CM | POA: Diagnosis not present

## 2017-05-04 DIAGNOSIS — E440 Moderate protein-calorie malnutrition: Secondary | ICD-10-CM | POA: Diagnosis not present

## 2017-05-04 DIAGNOSIS — Z79899 Other long term (current) drug therapy: Secondary | ICD-10-CM | POA: Diagnosis not present

## 2017-05-04 DIAGNOSIS — Z992 Dependence on renal dialysis: Secondary | ICD-10-CM | POA: Diagnosis not present

## 2017-05-04 DIAGNOSIS — I69354 Hemiplegia and hemiparesis following cerebral infarction affecting left non-dominant side: Secondary | ICD-10-CM | POA: Diagnosis not present

## 2017-05-04 DIAGNOSIS — D509 Iron deficiency anemia, unspecified: Secondary | ICD-10-CM | POA: Diagnosis not present

## 2017-05-04 DIAGNOSIS — N2581 Secondary hyperparathyroidism of renal origin: Secondary | ICD-10-CM | POA: Diagnosis not present

## 2017-05-04 DIAGNOSIS — I120 Hypertensive chronic kidney disease with stage 5 chronic kidney disease or end stage renal disease: Secondary | ICD-10-CM | POA: Diagnosis not present

## 2017-05-04 NOTE — Patient Instructions (Signed)

## 2017-05-04 NOTE — Progress Notes (Signed)
Botox Injection for spasticity using needle EMG guidance  Dilution: 50 Units/ml Indication: Severe spasticity which interferes with ADL,mobility and/or  hygiene and is unresponsive to medication management and other conservative care Informed consent was obtained after describing risks and benefits of the procedure with the patient. This includes bleeding, bruising, infection, excessive weakness, or medication side effects. A REMS form is on file and signed. Needle: 27g 1" needle electrode Number of units per muscle Pectoralis0 Biceps0 FCR50 FCU25 FDS50 FDP50 FPL25  All injections were done after obtaining appropriate EMG activity and after negative drawback for blood. The patient tolerated the procedure well. Post procedure instructions were given. A followup appointment was made.

## 2017-05-05 DIAGNOSIS — Z79899 Other long term (current) drug therapy: Secondary | ICD-10-CM | POA: Diagnosis not present

## 2017-05-05 DIAGNOSIS — D509 Iron deficiency anemia, unspecified: Secondary | ICD-10-CM | POA: Diagnosis not present

## 2017-05-05 DIAGNOSIS — N186 End stage renal disease: Secondary | ICD-10-CM | POA: Diagnosis not present

## 2017-05-05 DIAGNOSIS — N2581 Secondary hyperparathyroidism of renal origin: Secondary | ICD-10-CM | POA: Diagnosis not present

## 2017-05-05 DIAGNOSIS — E440 Moderate protein-calorie malnutrition: Secondary | ICD-10-CM | POA: Diagnosis not present

## 2017-05-05 DIAGNOSIS — D631 Anemia in chronic kidney disease: Secondary | ICD-10-CM | POA: Diagnosis not present

## 2017-05-05 DIAGNOSIS — N041 Nephrotic syndrome with focal and segmental glomerular lesions: Secondary | ICD-10-CM | POA: Diagnosis not present

## 2017-05-05 DIAGNOSIS — Z992 Dependence on renal dialysis: Secondary | ICD-10-CM | POA: Diagnosis not present

## 2017-05-05 DIAGNOSIS — Z0271 Encounter for disability determination: Secondary | ICD-10-CM

## 2017-05-06 DIAGNOSIS — Z5189 Encounter for other specified aftercare: Secondary | ICD-10-CM | POA: Diagnosis not present

## 2017-05-06 DIAGNOSIS — D638 Anemia in other chronic diseases classified elsewhere: Secondary | ICD-10-CM | POA: Diagnosis not present

## 2017-05-06 DIAGNOSIS — E878 Other disorders of electrolyte and fluid balance, not elsewhere classified: Secondary | ICD-10-CM | POA: Diagnosis not present

## 2017-05-06 DIAGNOSIS — Z79899 Other long term (current) drug therapy: Secondary | ICD-10-CM | POA: Diagnosis not present

## 2017-05-06 DIAGNOSIS — E440 Moderate protein-calorie malnutrition: Secondary | ICD-10-CM | POA: Diagnosis not present

## 2017-05-06 DIAGNOSIS — N186 End stage renal disease: Secondary | ICD-10-CM | POA: Diagnosis not present

## 2017-05-06 DIAGNOSIS — N2581 Secondary hyperparathyroidism of renal origin: Secondary | ICD-10-CM | POA: Diagnosis not present

## 2017-05-06 DIAGNOSIS — R17 Unspecified jaundice: Secondary | ICD-10-CM | POA: Diagnosis not present

## 2017-05-06 DIAGNOSIS — D509 Iron deficiency anemia, unspecified: Secondary | ICD-10-CM | POA: Diagnosis not present

## 2017-05-06 DIAGNOSIS — Z4932 Encounter for adequacy testing for peritoneal dialysis: Secondary | ICD-10-CM | POA: Diagnosis not present

## 2017-05-07 DIAGNOSIS — N2581 Secondary hyperparathyroidism of renal origin: Secondary | ICD-10-CM | POA: Diagnosis not present

## 2017-05-07 DIAGNOSIS — E890 Postprocedural hypothyroidism: Secondary | ICD-10-CM | POA: Diagnosis not present

## 2017-05-07 DIAGNOSIS — D638 Anemia in other chronic diseases classified elsewhere: Secondary | ICD-10-CM | POA: Diagnosis not present

## 2017-05-07 DIAGNOSIS — Z4932 Encounter for adequacy testing for peritoneal dialysis: Secondary | ICD-10-CM | POA: Diagnosis not present

## 2017-05-07 DIAGNOSIS — N186 End stage renal disease: Secondary | ICD-10-CM | POA: Diagnosis not present

## 2017-05-07 DIAGNOSIS — E878 Other disorders of electrolyte and fluid balance, not elsewhere classified: Secondary | ICD-10-CM | POA: Diagnosis not present

## 2017-05-07 DIAGNOSIS — R82998 Other abnormal findings in urine: Secondary | ICD-10-CM | POA: Diagnosis not present

## 2017-05-07 DIAGNOSIS — D509 Iron deficiency anemia, unspecified: Secondary | ICD-10-CM | POA: Diagnosis not present

## 2017-05-08 DIAGNOSIS — Z4932 Encounter for adequacy testing for peritoneal dialysis: Secondary | ICD-10-CM | POA: Diagnosis not present

## 2017-05-08 DIAGNOSIS — N186 End stage renal disease: Secondary | ICD-10-CM | POA: Diagnosis not present

## 2017-05-08 DIAGNOSIS — E878 Other disorders of electrolyte and fluid balance, not elsewhere classified: Secondary | ICD-10-CM | POA: Diagnosis not present

## 2017-05-08 DIAGNOSIS — D509 Iron deficiency anemia, unspecified: Secondary | ICD-10-CM | POA: Diagnosis not present

## 2017-05-08 DIAGNOSIS — N2581 Secondary hyperparathyroidism of renal origin: Secondary | ICD-10-CM | POA: Diagnosis not present

## 2017-05-08 DIAGNOSIS — D638 Anemia in other chronic diseases classified elsewhere: Secondary | ICD-10-CM | POA: Diagnosis not present

## 2017-05-09 DIAGNOSIS — D509 Iron deficiency anemia, unspecified: Secondary | ICD-10-CM | POA: Diagnosis not present

## 2017-05-09 DIAGNOSIS — N2581 Secondary hyperparathyroidism of renal origin: Secondary | ICD-10-CM | POA: Diagnosis not present

## 2017-05-09 DIAGNOSIS — Z4932 Encounter for adequacy testing for peritoneal dialysis: Secondary | ICD-10-CM | POA: Diagnosis not present

## 2017-05-09 DIAGNOSIS — N186 End stage renal disease: Secondary | ICD-10-CM | POA: Diagnosis not present

## 2017-05-09 DIAGNOSIS — D638 Anemia in other chronic diseases classified elsewhere: Secondary | ICD-10-CM | POA: Diagnosis not present

## 2017-05-09 DIAGNOSIS — E878 Other disorders of electrolyte and fluid balance, not elsewhere classified: Secondary | ICD-10-CM | POA: Diagnosis not present

## 2017-05-10 DIAGNOSIS — D638 Anemia in other chronic diseases classified elsewhere: Secondary | ICD-10-CM | POA: Diagnosis not present

## 2017-05-10 DIAGNOSIS — N186 End stage renal disease: Secondary | ICD-10-CM | POA: Diagnosis not present

## 2017-05-10 DIAGNOSIS — D509 Iron deficiency anemia, unspecified: Secondary | ICD-10-CM | POA: Diagnosis not present

## 2017-05-10 DIAGNOSIS — Z4932 Encounter for adequacy testing for peritoneal dialysis: Secondary | ICD-10-CM | POA: Diagnosis not present

## 2017-05-10 DIAGNOSIS — N2581 Secondary hyperparathyroidism of renal origin: Secondary | ICD-10-CM | POA: Diagnosis not present

## 2017-05-10 DIAGNOSIS — E878 Other disorders of electrolyte and fluid balance, not elsewhere classified: Secondary | ICD-10-CM | POA: Diagnosis not present

## 2017-05-11 DIAGNOSIS — D638 Anemia in other chronic diseases classified elsewhere: Secondary | ICD-10-CM | POA: Diagnosis not present

## 2017-05-11 DIAGNOSIS — N186 End stage renal disease: Secondary | ICD-10-CM | POA: Diagnosis not present

## 2017-05-11 DIAGNOSIS — E878 Other disorders of electrolyte and fluid balance, not elsewhere classified: Secondary | ICD-10-CM | POA: Diagnosis not present

## 2017-05-11 DIAGNOSIS — Z4932 Encounter for adequacy testing for peritoneal dialysis: Secondary | ICD-10-CM | POA: Diagnosis not present

## 2017-05-11 DIAGNOSIS — N2581 Secondary hyperparathyroidism of renal origin: Secondary | ICD-10-CM | POA: Diagnosis not present

## 2017-05-11 DIAGNOSIS — D509 Iron deficiency anemia, unspecified: Secondary | ICD-10-CM | POA: Diagnosis not present

## 2017-05-12 DIAGNOSIS — D509 Iron deficiency anemia, unspecified: Secondary | ICD-10-CM | POA: Diagnosis not present

## 2017-05-12 DIAGNOSIS — D638 Anemia in other chronic diseases classified elsewhere: Secondary | ICD-10-CM | POA: Diagnosis not present

## 2017-05-12 DIAGNOSIS — E878 Other disorders of electrolyte and fluid balance, not elsewhere classified: Secondary | ICD-10-CM | POA: Diagnosis not present

## 2017-05-12 DIAGNOSIS — Z4932 Encounter for adequacy testing for peritoneal dialysis: Secondary | ICD-10-CM | POA: Diagnosis not present

## 2017-05-12 DIAGNOSIS — N186 End stage renal disease: Secondary | ICD-10-CM | POA: Diagnosis not present

## 2017-05-12 DIAGNOSIS — N2581 Secondary hyperparathyroidism of renal origin: Secondary | ICD-10-CM | POA: Diagnosis not present

## 2017-05-13 DIAGNOSIS — D509 Iron deficiency anemia, unspecified: Secondary | ICD-10-CM | POA: Diagnosis not present

## 2017-05-13 DIAGNOSIS — N186 End stage renal disease: Secondary | ICD-10-CM | POA: Diagnosis not present

## 2017-05-13 DIAGNOSIS — E878 Other disorders of electrolyte and fluid balance, not elsewhere classified: Secondary | ICD-10-CM | POA: Diagnosis not present

## 2017-05-13 DIAGNOSIS — D638 Anemia in other chronic diseases classified elsewhere: Secondary | ICD-10-CM | POA: Diagnosis not present

## 2017-05-13 DIAGNOSIS — N2581 Secondary hyperparathyroidism of renal origin: Secondary | ICD-10-CM | POA: Diagnosis not present

## 2017-05-13 DIAGNOSIS — Z4932 Encounter for adequacy testing for peritoneal dialysis: Secondary | ICD-10-CM | POA: Diagnosis not present

## 2017-05-14 DIAGNOSIS — D638 Anemia in other chronic diseases classified elsewhere: Secondary | ICD-10-CM | POA: Diagnosis not present

## 2017-05-14 DIAGNOSIS — Z4932 Encounter for adequacy testing for peritoneal dialysis: Secondary | ICD-10-CM | POA: Diagnosis not present

## 2017-05-14 DIAGNOSIS — E878 Other disorders of electrolyte and fluid balance, not elsewhere classified: Secondary | ICD-10-CM | POA: Diagnosis not present

## 2017-05-14 DIAGNOSIS — D509 Iron deficiency anemia, unspecified: Secondary | ICD-10-CM | POA: Diagnosis not present

## 2017-05-14 DIAGNOSIS — N186 End stage renal disease: Secondary | ICD-10-CM | POA: Diagnosis not present

## 2017-05-14 DIAGNOSIS — N2581 Secondary hyperparathyroidism of renal origin: Secondary | ICD-10-CM | POA: Diagnosis not present

## 2017-05-15 DIAGNOSIS — D509 Iron deficiency anemia, unspecified: Secondary | ICD-10-CM | POA: Diagnosis not present

## 2017-05-15 DIAGNOSIS — E878 Other disorders of electrolyte and fluid balance, not elsewhere classified: Secondary | ICD-10-CM | POA: Diagnosis not present

## 2017-05-15 DIAGNOSIS — D638 Anemia in other chronic diseases classified elsewhere: Secondary | ICD-10-CM | POA: Diagnosis not present

## 2017-05-15 DIAGNOSIS — N186 End stage renal disease: Secondary | ICD-10-CM | POA: Diagnosis not present

## 2017-05-15 DIAGNOSIS — Z4932 Encounter for adequacy testing for peritoneal dialysis: Secondary | ICD-10-CM | POA: Diagnosis not present

## 2017-05-15 DIAGNOSIS — N2581 Secondary hyperparathyroidism of renal origin: Secondary | ICD-10-CM | POA: Diagnosis not present

## 2017-05-16 DIAGNOSIS — N186 End stage renal disease: Secondary | ICD-10-CM | POA: Diagnosis not present

## 2017-05-16 DIAGNOSIS — D509 Iron deficiency anemia, unspecified: Secondary | ICD-10-CM | POA: Diagnosis not present

## 2017-05-16 DIAGNOSIS — Z4932 Encounter for adequacy testing for peritoneal dialysis: Secondary | ICD-10-CM | POA: Diagnosis not present

## 2017-05-16 DIAGNOSIS — D638 Anemia in other chronic diseases classified elsewhere: Secondary | ICD-10-CM | POA: Diagnosis not present

## 2017-05-16 DIAGNOSIS — N2581 Secondary hyperparathyroidism of renal origin: Secondary | ICD-10-CM | POA: Diagnosis not present

## 2017-05-16 DIAGNOSIS — E878 Other disorders of electrolyte and fluid balance, not elsewhere classified: Secondary | ICD-10-CM | POA: Diagnosis not present

## 2017-05-17 DIAGNOSIS — D638 Anemia in other chronic diseases classified elsewhere: Secondary | ICD-10-CM | POA: Diagnosis not present

## 2017-05-17 DIAGNOSIS — N186 End stage renal disease: Secondary | ICD-10-CM | POA: Diagnosis not present

## 2017-05-17 DIAGNOSIS — D509 Iron deficiency anemia, unspecified: Secondary | ICD-10-CM | POA: Diagnosis not present

## 2017-05-17 DIAGNOSIS — E878 Other disorders of electrolyte and fluid balance, not elsewhere classified: Secondary | ICD-10-CM | POA: Diagnosis not present

## 2017-05-17 DIAGNOSIS — N2581 Secondary hyperparathyroidism of renal origin: Secondary | ICD-10-CM | POA: Diagnosis not present

## 2017-05-17 DIAGNOSIS — E890 Postprocedural hypothyroidism: Secondary | ICD-10-CM | POA: Diagnosis not present

## 2017-05-17 DIAGNOSIS — Z4932 Encounter for adequacy testing for peritoneal dialysis: Secondary | ICD-10-CM | POA: Diagnosis not present

## 2017-05-18 DIAGNOSIS — N2581 Secondary hyperparathyroidism of renal origin: Secondary | ICD-10-CM | POA: Diagnosis not present

## 2017-05-18 DIAGNOSIS — E878 Other disorders of electrolyte and fluid balance, not elsewhere classified: Secondary | ICD-10-CM | POA: Diagnosis not present

## 2017-05-18 DIAGNOSIS — Z4932 Encounter for adequacy testing for peritoneal dialysis: Secondary | ICD-10-CM | POA: Diagnosis not present

## 2017-05-18 DIAGNOSIS — D509 Iron deficiency anemia, unspecified: Secondary | ICD-10-CM | POA: Diagnosis not present

## 2017-05-18 DIAGNOSIS — N186 End stage renal disease: Secondary | ICD-10-CM | POA: Diagnosis not present

## 2017-05-18 DIAGNOSIS — D638 Anemia in other chronic diseases classified elsewhere: Secondary | ICD-10-CM | POA: Diagnosis not present

## 2017-05-19 ENCOUNTER — Ambulatory Visit: Payer: Medicare Other | Attending: Physical Medicine & Rehabilitation | Admitting: Physical Therapy

## 2017-05-19 ENCOUNTER — Ambulatory Visit: Payer: Medicare Other | Admitting: Occupational Therapy

## 2017-05-19 VITALS — BP 100/69 | HR 67

## 2017-05-19 DIAGNOSIS — R278 Other lack of coordination: Secondary | ICD-10-CM | POA: Insufficient documentation

## 2017-05-19 DIAGNOSIS — M25622 Stiffness of left elbow, not elsewhere classified: Secondary | ICD-10-CM | POA: Diagnosis not present

## 2017-05-19 DIAGNOSIS — D638 Anemia in other chronic diseases classified elsewhere: Secondary | ICD-10-CM | POA: Diagnosis not present

## 2017-05-19 DIAGNOSIS — R29898 Other symptoms and signs involving the musculoskeletal system: Secondary | ICD-10-CM | POA: Diagnosis not present

## 2017-05-19 DIAGNOSIS — N2581 Secondary hyperparathyroidism of renal origin: Secondary | ICD-10-CM | POA: Diagnosis not present

## 2017-05-19 DIAGNOSIS — M6281 Muscle weakness (generalized): Secondary | ICD-10-CM

## 2017-05-19 DIAGNOSIS — R2689 Other abnormalities of gait and mobility: Secondary | ICD-10-CM | POA: Diagnosis not present

## 2017-05-19 DIAGNOSIS — R296 Repeated falls: Secondary | ICD-10-CM | POA: Diagnosis not present

## 2017-05-19 DIAGNOSIS — R2681 Unsteadiness on feet: Secondary | ICD-10-CM | POA: Diagnosis not present

## 2017-05-19 DIAGNOSIS — I69052 Hemiplegia and hemiparesis following nontraumatic subarachnoid hemorrhage affecting left dominant side: Secondary | ICD-10-CM

## 2017-05-19 DIAGNOSIS — I69254 Hemiplegia and hemiparesis following other nontraumatic intracranial hemorrhage affecting left non-dominant side: Secondary | ICD-10-CM | POA: Diagnosis not present

## 2017-05-19 DIAGNOSIS — E878 Other disorders of electrolyte and fluid balance, not elsewhere classified: Secondary | ICD-10-CM | POA: Diagnosis not present

## 2017-05-19 DIAGNOSIS — N186 End stage renal disease: Secondary | ICD-10-CM | POA: Diagnosis not present

## 2017-05-19 DIAGNOSIS — Z4932 Encounter for adequacy testing for peritoneal dialysis: Secondary | ICD-10-CM | POA: Diagnosis not present

## 2017-05-19 DIAGNOSIS — D509 Iron deficiency anemia, unspecified: Secondary | ICD-10-CM | POA: Diagnosis not present

## 2017-05-19 NOTE — Therapy (Signed)
[facility] [address] [place], [place], [address] Phone: [phone]   Fax:  [phone]  Occupational Therapy Evaluation  Patient Details  Name: Terry Juarez MRN: [id number] Date of Birth: [birth year] Referring Provider: Dr. Letta Pate   Encounter Date: 05/19/2017  OT End of Session - 05/19/17 1346    Visit Number  1    Number of Visits  17    Date for OT Re-Evaluation  07/18/17    Authorization Type  Medicare    Authorization Time Period  60 days    Authorization - Visit Number  1    Authorization - Number of Visits  10    OT Start Time  [phone]    OT Stop Time  1005    OT Time Calculation (min)  27 min    Activity Tolerance  Patient tolerated treatment well    Behavior During Therapy  [facility] for tasks assessed/performed       Past Medical History:  Diagnosis Date  . Anemia   . Bruises easily   . Dialysis patient ([place])   . ESRD on dialysis ([place])   . Hyperlipidemia   . Hypertension   . Renal disorder    rt renal mass / < functioning of left kidney - being prepared for possible dialysis  . Right renal mass   . Stroke [facility])     Past Surgical History:  Procedure Laterality Date  . ECTOPIC PREGNANCY SURGERY  1987    There were no vitals filed for this visit.  Subjective Assessment - 05/19/17 0938    Subjective   Pt is a 51 y.o female  hospitalized 12/09/16 with frontal hemorrhage with small SAH, posterior encephalopathy syndrome. Urine drug scren was postive for benzodiazapine.    Currently in Pain?  No/denies        [facility] OT Assessment - 05/19/17 0940      Assessment   Diagnosis  CVA    Referring Provider  Dr. Letta Pate    Prior Therapy  [facility]      Precautions   Precautions  Fall;Other (comment)      Balance Screen   Has the patient fallen in the past 6 months  Yes    How many times?  2    Has the patient had a decrease in activity level because of a fear of falling?   Yes    Is the patient  reluctant to leave their home because of a fear of falling?   No      Home  Environment   Family/patient expects to be discharged to:  Private residence    Available Help at [facility] shower chair    Lives With  Family      Prior Function   Level of Independence  Independent    Vocation  Full time employment    Vocation Requirements  Waitress      ADL   Eating/Feeding  Independent    Grooming  -- needs assist with styling hair    Upper Body Bathing  Modified independent    Lower Body Bathing  Modified independent    Upper Body Dressing  Independent    Lower Body Dressing  Modified independent    Toilet Transfer  Modified independent quad cane    Tub/Shower Transfer  Modified independent someone is at home but not in same room  IADL   Shopping  Needs to be accompanied on any shopping trip    Light Housekeeping  Performs light daily tasks such as dishwashing, bed making    Meal Prep  Able to complete simple warm meal prep    Medication Management  Is responsible for taking medication in correct dosages at correct time    Financial Management  Manages financial matters independently (budgets, writes checks, pays rent, bills goes to bank), collects and keeps track of income      Mobility   Mobility Status  History of falls modified independent with quad cane      Written Expression   Dominant Hand  Right      Vision - History   Baseline Vision  Wears glasses all the time      Vision Assessment   Vision Assessment  Vision not tested      Cognition   Overall Cognitive Status  Within Functional Limits for tasks assessed      Sensation   Light Touch  Appears Intact      Coordination   Gross Motor Movements are Fluid and Coordinated  No    Fine Motor Movements are Fluid and Coordinated  No      Tone   Assessment Location  Left Upper Extremity      ROM / Strength   AROM / PROM / Strength  AROM       AROM   Overall AROM   Deficits    Overall AROM Comments  Pt presents with moderate spasticity in LUE. She demonstrates the following A/ROM: shoulder abduction:45, flexion trace, elbow flexion/ extension: 80/-40, unable to supinate, 50% pronation, trace finger flexion/ extension.       LUE Tone   LUE Tone  Hypertonic;Moderate                        OT Short Term Goals - 05/19/17 1347      OT SHORT TERM GOAL #1   Title  I with inital HEP.    Time  4    Period  Weeks    Status  New    Target Date  07/18/17      OT SHORT TERM GOAL #2   Title  Pt will verbalize understanding of LUE positioning to minimize risk for contracture and future injury.    Time  4    Period  Weeks    Status  New      OT SHORT TERM GOAL #3   Title  Pt will demonstrate 25% finger flexion/ extension consistently in prep for functional grasp.    Time  4    Period  Weeks    Status  New      OT SHORT TERM GOAL #4   Title  Pt will demonstrate -30 A/ROM elbow extension in prep for functional reach.    Time  4    Period  Weeks    Status  New      OT SHORT TERM GOAL #5   Title  Pt will use LUE as a stabilizer/ gross A at least 25% of the time.    Time  4    Period  Weeks    Status  New        OT Long Term Goals - 05/19/17 1351      OT LONG TERM GOAL #1   Title  I with updated HEP.    Time  8  Period  Weeks    Status  New      OT LONG TERM GOAL #2   Title  Pt will resume performeance of mod complex cooking/ home management at a modified independent level.    Time  8    Period  Weeks    Status  New      OT LONG TERM GOAL #3   Title  Pt will demonstrate at least 40* A/ROM shoulder flexion in perp for functional reach.    Time  8    Period  Weeks    Status  New      OT LONG TERM GOAL #4   Title  Pt will report improved endurance for grocery shopping with ability to complete shopping modified independently with no more that 2 rest breaks.    Baseline  requres multiple rest  breaks, fatigute after walking 3-4 aisles    Time  8    Period  Weeks    Status  New      OT LONG TERM GOAL #5   Title  Pt will utilize LUE as a gross A 50% of the time for ADLs/ IADLs.    Time  8    Period  Weeks    Status  New            Plan - 05/19/17 1335    Clinical Impression Statement  Pt is a 51 y.o female  hospitalized 12/09/16 with frontal hemorrhage with small SAH, posterior encephalopathy syndrome. Urine drug scren was postive for benzodiazapine. Pt presents with the following deficits: LUE hemiplegia, spasticity,decreased LUE functional use,  decreased balance and functional mobility which impedes performance of ADLS/IADLS. Pt can benefit from skillded occupational therapy to maximize pt's safety and independence with daily activities.    Occupational Profile and client history currently impacting functional performance  PMH: CVA, ESRD with right renal CA s/p nephrectomy, HD at home, seizures, HTN, hypothyroidism, seizure at time of CVA, Pt was Independent prior to CVA, she was working as a Educational psychologist at [employer] deficits (Please refer to evaluation for details):  ADL's;IADL's;Work;Play;Social Participation    Rehab Potential  Good    Current Impairments/barriers affecting progress:  spasticity    OT Frequency  2x / week plus eval    OT Duration  8 weeks    OT Treatment/Interventions  Self-care/ADL training;Moist Heat;Fluidtherapy;DME and/or AE instruction;Splinting;Patient/family education;Therapeutic exercises;Balance training;Ultrasound;Therapeutic exercise;Scar mobilization;Therapeutic activities;Cognitive remediation/compensation;Passive range of motion;Functional Mobility Training;Neuromuscular education;Cryotherapy;Electrical Stimulation;Parrafin;Energy conservation;Manual Therapy    Plan  initiate HEP    Clinical Decision Making  Limited treatment options, no task modification necessary    Consulted and Agree with Plan of Care  Patient        Patient will benefit from skilled therapeutic intervention in order to improve the following deficits and impairments:  Decreased knowledge of use of DME, Decreased mobility, Decreased coordination, Decreased activity tolerance, Decreased endurance, Decreased range of motion, Decreased strength, Impaired tone, Impaired UE functional use, Impaired perceived functional ability, Decreased safety awareness, Decreased knowledge of precautions, Decreased balance  Visit Diagnosis: Hemiplegia and hemiparesis following nontraumatic subarachnoid hemorrhage affecting left dominant side ([place]) - Plan: Ot plan of care cert/re-cert  Muscle weakness (generalized) - Plan: Ot plan of care cert/re-cert  Other symptoms and signs involving the musculoskeletal system - Plan: Ot plan of care cert/re-cert  Other lack of coordination - Plan: Ot plan of care cert/re-cert  Unsteadiness on feet - Plan: Ot plan of care cert/re-cert  Other  abnormalities of gait and mobility - Plan: Ot plan of care cert/re-cert  Stiffness of left elbow, not elsewhere classified - Plan: Ot plan of care cert/re-cert  G-Codes - 37/62/83 1357    Functional Assessment Tool Used (Outpatient only)  clinical impressions; unable to use LUE functionally due to weakness and spasticity, elbow a/ROM extension-40, flexion 80    Functional Limitation  Carrying, moving and handling objects    Carrying, Moving and Handling Objects Current Status (T5176)  At least 80 percent but less than 100 percent impaired, limited or restricted    Carrying, Moving and Handling Objects Goal Status (H6073)  At least 60 percent but less than 80 percent impaired, limited or restricted       Problem List Patient Active Problem List   Diagnosis Date Noted  . Hypertension secondary to other renal disorders 04/08/2017  . Left spastic hemiparesis ([place])   . Fall   . Slow transit constipation 01/28/2017  . Flaccid hemiplegia of left nondominant side as late effect of  nontraumatic intraparenchymal hemorrhage of brain ([place])   . ESRD on dialysis ([place])   . Leukocytosis   . Hyperglycemia   . Hypertensive emergency 01/04/2017  . Acute left hemiparesis ([place]) 01/04/2017  . Intracranial hemorrhage ([place]) 01/04/2017  . Subarachnoid hematoma ([place])   . Hypothyroidism   . Benign essential HTN   . Seizure prophylaxis   . Cardiac arrest, cause unspecified ([place])   . Acute respiratory failure with hypoxemia ([place])   . Acute encephalopathy   . ICH (intracerebral hemorrhage) ([place])   . Seizure ([place])   . PRES (posterior reversible encephalopathy syndrome)   . Subarachnoid hemorrhage 12/19/2016  . Symptomatic anemia 07/29/2016  . Cough 01/09/2016  . Elevated troponin 02/12/2015  . Headache 02/12/2015  . Syncope 02/12/2015  . ESRD on peritoneal dialysis ([place]) 02/12/2015  . Hypertension   . Hyperlipidemia   . Anemia   . Essential hypertension   . Cephalalgia   . Right renal mass 07/25/2014    Ixel Boehning 05/19/2017, 2:03 PM Theone Murdoch, OTR/L [phone] Phone: [phone] 2:03 PM 05/19/17 [facility] [address] [place], [place], [address] Phone: [phone]   Fax:  [phone]  Name: Terry Juarez MRN: [id number] Date of Birth: 12/06/1965

## 2017-05-19 NOTE — Therapy (Signed)
[facility] [address] [place], [place], [address] Phone: [phone]   Fax:  [phone]  Physical Therapy Evaluation  Patient Details  Name: Terry Juarez MRN: [id number] Date of Birth: [birth year] Referring Provider: Charlett Blake, MD   Encounter Date: 05/19/2017  PT End of Session - 05/19/17 1853    Visit Number  1    Number of Visits  17    Date for PT Re-Evaluation  07/18/17    Authorization Type  Medicare: G code and PN every 10th visit    PT Start Time  0845    PT Stop Time  0929    PT Time Calculation (min)  44 min    Activity Tolerance  Patient tolerated treatment well    Behavior During Therapy  [facility] for tasks assessed/performed       Past Medical History:  Diagnosis Date  . Anemia   . Bruises easily   . Dialysis patient ([place])   . ESRD on dialysis ([place])   . Hyperlipidemia   . Hypertension   . Renal disorder    rt renal mass / < functioning of left kidney - being prepared for possible dialysis  . Right renal mass   . Stroke [facility])     Past Surgical History:  Procedure Laterality Date  . ECTOPIC PREGNANCY SURGERY  1987    Vitals:   05/19/17 0855  BP: 100/69  Pulse: 67     Subjective Assessment - 05/19/17 0859    Subjective  Pt presents to OPPT evaluation s/p CVA/seizures in 12/2016.  Pt just finished with HHPT and HHOT but continues to report LUE paralysis, LLE weakness with foot drop and 2 falls without injury.   Has received one botox injection in the LUE to improve finger extension; uses LUE sling when ambulating long distances to provide LUE support.  Pt has an AFO but has not worn it since October; falls occured after she stopped wearing the AFO.      Pertinent History  anemia, ESRD on peritoneal dialysis, hyperlipidemia, HTN, renal cell carcinoma s/p right nephrectomy, seizure, CVA    Limitations  Walking    How long can you walk comfortably?  4 aisles in the grocery store; also  has difficulty reaching out of BOS-tends to lean L    Patient Stated Goals  improve LLE strength, especially L foot drag     Currently in Pain?  No/denies         [facility] PT Assessment - 05/19/17 0900      Assessment   Medical Diagnosis  R CVA with L hemiplegia    Referring Provider  Charlett Blake, MD    Onset Date/Surgical Date  12/19/16    Hand Dominance  Right    Prior Therapy  HHPT and HHOT      Precautions   Precautions  Fall;Other (comment)    Precaution Comments  RUE restricted - take BP in LUE; anemia, ESRD on peritoneal dialysis, hyperlipidemia, HTN, renal cell carcinoma s/p right nephrectomy, seizure, CVA      Balance Screen   Has the patient fallen in the past 6 months  Yes    How many times?  2    Has the patient had a decrease in activity level because of a fear of falling?   Yes    Is the patient reluctant to leave their home because of a fear of falling?   No      Home Environment  Living Environment  Private residence    Living Arrangements  Other (Comment) Brother    Type of [place] to enter    Entrance Stairs-Number of Steps  3    [place]  One level    [place] - quad;Shower seat    Additional Comments  Has not returned to driving      Prior Function   Level of Independence  Independent    Vocation  Full time employment    Barista      Observation/Other Assessments   Focus on Therapeutic Outcomes (FOTO)   53% (47% limited; predicted 38% limited by D/C)      Sensation   Light Touch  Appears Intact      ROM / Strength   AROM / PROM / Strength  Strength      Strength   Overall Strength  Deficits    Overall Strength Comments  LLE: 4-/5 hip flexion, knee extension, knee flexion and ankle DF delayed activation      Ambulation/Gait   Ambulation/Gait  Yes    Ambulation/Gait Assistance  4: Min assist without cane; MOD I with quad cane     Ambulation/Gait Assistance Details  entered treatment area with quad cane; gait velocity and assessment performed without AD    Ambulation Distance (Feet)  115 Feet    Assistive device  Large base quad cane;None    Gait Pattern  Step-through pattern;Decreased arm swing - left;Decreased step length - right;Decreased step length - left;Decreased stride length;Decreased hip/knee flexion - left;Decreased dorsiflexion - left;Decreased trunk rotation;Poor foot clearance - left    Ambulation Surface  Level;Indoor    Stairs  Yes    Stairs Assistance  4: Min guard    Stair Management Technique  One rail Right;Step to pattern;Forwards    Number of Stairs  4    Height of Stairs  6      Standardized Balance Assessment   Standardized Balance Assessment  Berg Balance Test;10 meter walk test;Five Times Sit to Stand    Five times sit to stand comments   13.9 seconds without UE use from arm chair    10 Meter Walk  21.96 seconds or 1.49 ft/sec without cane             Objective measurements completed on examination: See above findings.              PT Education - 05/19/17 1852    Education provided  Yes    Education Details  Clinical findings, PT POC and goals    Person(s) Educated  Patient    Methods  Explanation    Comprehension  Verbalized understanding       PT Short Term Goals - 05/19/17 1859      PT SHORT TERM GOAL #1   Title  Pt will participate in further balance assessment with BERG    Time  4    Period  Weeks    Status  New    Target Date  06/18/17      PT SHORT TERM GOAL #2   Title  Five time sit to stand will improve to < or = 12 seconds from arm chair, without UE use    Baseline  13.9 seconds    Time  4    Period  Weeks    Status  New    Target  Date  06/18/17      PT SHORT TERM GOAL #3   Title  Gait velocity without cane will improve to > or = 1.92ft/sec    Baseline  1.49 ft/sec    Time  4    Period  Weeks    Status  New    Target Date  06/18/17       PT SHORT TERM GOAL #4   Title  Pt will negotiate 3 stairs with R rail performing alternating sequence with supervision    Baseline  step to, min guard    Time  4    Period  Weeks    Status  New    Target Date  06/18/17      PT SHORT TERM GOAL #5   Title  Will demonstrate improved ability to reach outside of BOS x 10' to L, R, forwards, to floor and maintain balance    Time  4    Period  Weeks    Status  New    Target Date  06/18/17        PT Long Term Goals - 05/19/17 1905      PT LONG TERM GOAL #1   Title  Pt will demonstrate independence with HEP and walking program    Time  8    Period  Weeks    Status  New    Target Date  07/18/17      PT LONG TERM GOAL #2   Title  Increase BERG score by 8 points to indicate decreased falls risk    Baseline  TBD    Time  8    Period  Weeks    Status  New    Target Date  07/18/17      PT LONG TERM GOAL #3   Title  Will decrease five time sit to stand to < or = 10 seconds    Time  8    Period  Weeks    Status  New    Target Date  07/18/17      PT LONG TERM GOAL #4   Title  Improve gait velocity to > or = >2.0 ft/sec without cane    Time  8    Period  Weeks    Status  New    Target Date  07/18/17      PT LONG TERM GOAL #5   Title  Will ambulate 1,000 ft over uneven outdoor surfaces/curb/negotiate 3 stairs with R rail, alternating sequence MOD I without AD    Time  8    Period  Weeks    Status  New    Target Date  07/18/17      Additional Long Term Goals   Additional Long Term Goals  Yes      PT LONG TERM GOAL #6   Title  Will improve overall function (FOTO) score to 70%    Baseline  53%    Time  8    Period  Weeks    Status  New    Target Date  07/18/17             Plan - 05/19/17 1853    Clinical Impression Statement  Pt is a 51 year old female presenting to [facility] neuro for evaluation of Right frontal hemorrhage with onset seizure, left upper limb greater than lower limb weakness, spastic hemiparesis, MRI  consistent with PRES (posterior reversible encephalopathy syndrome).  Pt also presents with increasing spasticity and is  receiving BOTOX from physiatrist.  Pt recently finished HHPT and OT; uses cane and LUE sling when ambulating long distances to keep the LUE supported and protected from hitting objects.  Pt has AFO but currently does not wear it. Pt's PMH significant for the following: anemia, ESRD on peritoneal dialysis, hyperlipidemia, HTN, renal cell carcinoma s/p right nephrectomy, seizure, CVA.  Pt has been restricted to no driving due to seizures. The following deficits were noted during pt's exam: L hemiplegia UE > LE, impaired strength and delayed activation, impaired balance and balance reactions, impaired gait.  Pt's gait velocity and five time sit to stand score indicate that pt is recurrent fall risk. Pt would benefit from skilled PT to address these impairments and functional limitations to maximize functional mobility independence and reduce falls risk.    History and Personal Factors relevant to plan of care:  anemia, ESRD on peritoneal dialysis - performs herself at home, hyperlipidemia, HTN, renal cell carcinoma s/p right nephrectomy, seizure, CVA, prior to CVA pt lived alone-brother is staying with her temporarily, currently unable to drive    Clinical Presentation  Evolving    Clinical Presentation due to:  anemia, ESRD on peritoneal dialysis - performs herself at home, hyperlipidemia, HTN, renal cell carcinoma s/p right nephrectomy, seizure, CVA, prior to CVA pt lived alone-brother is staying with her temporarily, currently unable to drive    Clinical Decision Making  Moderate    Rehab Potential  Good    PT Frequency  2x / week    PT Duration  8 weeks    PT Treatment/Interventions  ADLs/Self Care Home Management;Electrical Stimulation;DME Instruction;Gait training;Stair training;Functional mobility training;Therapeutic activities;Therapeutic exercise;Balance training;Neuromuscular  re-education;Patient/family education;Orthotic Fit/Training;Passive range of motion;Taping    PT Next Visit Plan  assess BERG and initiate HEP    Consulted and Agree with Plan of Care  Patient       Patient will benefit from skilled therapeutic intervention in order to improve the following deficits and impairments:  Abnormal gait, Decreased balance, Decreased strength, Difficulty walking, Impaired tone, Impaired UE functional use  Visit Diagnosis: Hemiplegia and hemiparesis following other nontraumatic intracranial hemorrhage affecting left non-dominant side (HCC)  Repeated falls  Unsteadiness on feet  Other abnormalities of gait and mobility  Muscle weakness (generalized)  G-Codes - June 04, 2017 1912    Functional Assessment Tool Used (Outpatient Only)  five time sit to stand 13.9 sec, gait velocity 1.49 ft/sec, FOTO: 53%/47% impaired    Functional Limitation  Mobility: Walking and moving around    Mobility: Walking and Moving Around Current Status (J6283)  At least 40 percent but less than 60 percent impaired, limited or restricted    Mobility: Walking and Moving Around Goal Status [phone])  At least 20 percent but less than 40 percent impaired, limited or restricted        Problem List Patient Active Problem List   Diagnosis Date Noted  . Hypertension secondary to other renal disorders 04/08/2017  . Left spastic hemiparesis ([place])   . Fall   . Slow transit constipation 01/28/2017  . Flaccid hemiplegia of left nondominant side as late effect of nontraumatic intraparenchymal hemorrhage of brain ([place])   . ESRD on dialysis ([place])   . Leukocytosis   . Hyperglycemia   . Hypertensive emergency 01/04/2017  . Acute left hemiparesis ([place]) 01/04/2017  . Intracranial hemorrhage ([place]) 01/04/2017  . Subarachnoid hematoma ([place])   . Hypothyroidism   . Benign essential HTN   . Seizure prophylaxis   . Cardiac arrest,  cause unspecified ([place])   . Acute respiratory failure with hypoxemia ([place])    . Acute encephalopathy   . ICH (intracerebral hemorrhage) ([place])   . Seizure ([place])   . PRES (posterior reversible encephalopathy syndrome)   . Subarachnoid hemorrhage 12/19/2016  . Symptomatic anemia 07/29/2016  . Cough 01/09/2016  . Elevated troponin 02/12/2015  . Headache 02/12/2015  . Syncope 02/12/2015  . ESRD on peritoneal dialysis ([place]) 02/12/2015  . Hypertension   . Hyperlipidemia   . Anemia   . Essential hypertension   . Cephalalgia   . Right renal mass 07/25/2014    Rico Junker, PT, DPT 05/19/17    7:14 PM    [facility] [address] [place], [place], [address] Phone: [phone]   Fax:  [phone]  Name: Terry Juarez MRN: [id number] Date of Birth: [birth year]

## 2017-05-20 DIAGNOSIS — D638 Anemia in other chronic diseases classified elsewhere: Secondary | ICD-10-CM | POA: Diagnosis not present

## 2017-05-20 DIAGNOSIS — N2581 Secondary hyperparathyroidism of renal origin: Secondary | ICD-10-CM | POA: Diagnosis not present

## 2017-05-20 DIAGNOSIS — N186 End stage renal disease: Secondary | ICD-10-CM | POA: Diagnosis not present

## 2017-05-20 DIAGNOSIS — E878 Other disorders of electrolyte and fluid balance, not elsewhere classified: Secondary | ICD-10-CM | POA: Diagnosis not present

## 2017-05-20 DIAGNOSIS — Z4932 Encounter for adequacy testing for peritoneal dialysis: Secondary | ICD-10-CM | POA: Diagnosis not present

## 2017-05-20 DIAGNOSIS — D509 Iron deficiency anemia, unspecified: Secondary | ICD-10-CM | POA: Diagnosis not present

## 2017-05-21 DIAGNOSIS — D638 Anemia in other chronic diseases classified elsewhere: Secondary | ICD-10-CM | POA: Diagnosis not present

## 2017-05-21 DIAGNOSIS — Z4932 Encounter for adequacy testing for peritoneal dialysis: Secondary | ICD-10-CM | POA: Diagnosis not present

## 2017-05-21 DIAGNOSIS — D509 Iron deficiency anemia, unspecified: Secondary | ICD-10-CM | POA: Diagnosis not present

## 2017-05-21 DIAGNOSIS — N2581 Secondary hyperparathyroidism of renal origin: Secondary | ICD-10-CM | POA: Diagnosis not present

## 2017-05-21 DIAGNOSIS — N186 End stage renal disease: Secondary | ICD-10-CM | POA: Diagnosis not present

## 2017-05-21 DIAGNOSIS — E878 Other disorders of electrolyte and fluid balance, not elsewhere classified: Secondary | ICD-10-CM | POA: Diagnosis not present

## 2017-05-22 DIAGNOSIS — D509 Iron deficiency anemia, unspecified: Secondary | ICD-10-CM | POA: Diagnosis not present

## 2017-05-22 DIAGNOSIS — Z4932 Encounter for adequacy testing for peritoneal dialysis: Secondary | ICD-10-CM | POA: Diagnosis not present

## 2017-05-22 DIAGNOSIS — N2581 Secondary hyperparathyroidism of renal origin: Secondary | ICD-10-CM | POA: Diagnosis not present

## 2017-05-22 DIAGNOSIS — E878 Other disorders of electrolyte and fluid balance, not elsewhere classified: Secondary | ICD-10-CM | POA: Diagnosis not present

## 2017-05-22 DIAGNOSIS — D638 Anemia in other chronic diseases classified elsewhere: Secondary | ICD-10-CM | POA: Diagnosis not present

## 2017-05-22 DIAGNOSIS — N186 End stage renal disease: Secondary | ICD-10-CM | POA: Diagnosis not present

## 2017-05-23 DIAGNOSIS — Z4932 Encounter for adequacy testing for peritoneal dialysis: Secondary | ICD-10-CM | POA: Diagnosis not present

## 2017-05-23 DIAGNOSIS — E878 Other disorders of electrolyte and fluid balance, not elsewhere classified: Secondary | ICD-10-CM | POA: Diagnosis not present

## 2017-05-23 DIAGNOSIS — D638 Anemia in other chronic diseases classified elsewhere: Secondary | ICD-10-CM | POA: Diagnosis not present

## 2017-05-23 DIAGNOSIS — N186 End stage renal disease: Secondary | ICD-10-CM | POA: Diagnosis not present

## 2017-05-23 DIAGNOSIS — D509 Iron deficiency anemia, unspecified: Secondary | ICD-10-CM | POA: Diagnosis not present

## 2017-05-23 DIAGNOSIS — N2581 Secondary hyperparathyroidism of renal origin: Secondary | ICD-10-CM | POA: Diagnosis not present

## 2017-05-24 DIAGNOSIS — N186 End stage renal disease: Secondary | ICD-10-CM | POA: Diagnosis not present

## 2017-05-24 DIAGNOSIS — E878 Other disorders of electrolyte and fluid balance, not elsewhere classified: Secondary | ICD-10-CM | POA: Diagnosis not present

## 2017-05-24 DIAGNOSIS — Z4932 Encounter for adequacy testing for peritoneal dialysis: Secondary | ICD-10-CM | POA: Diagnosis not present

## 2017-05-24 DIAGNOSIS — D509 Iron deficiency anemia, unspecified: Secondary | ICD-10-CM | POA: Diagnosis not present

## 2017-05-24 DIAGNOSIS — N2581 Secondary hyperparathyroidism of renal origin: Secondary | ICD-10-CM | POA: Diagnosis not present

## 2017-05-24 DIAGNOSIS — D638 Anemia in other chronic diseases classified elsewhere: Secondary | ICD-10-CM | POA: Diagnosis not present

## 2017-05-25 DIAGNOSIS — Z4932 Encounter for adequacy testing for peritoneal dialysis: Secondary | ICD-10-CM | POA: Diagnosis not present

## 2017-05-25 DIAGNOSIS — N186 End stage renal disease: Secondary | ICD-10-CM | POA: Diagnosis not present

## 2017-05-25 DIAGNOSIS — N2581 Secondary hyperparathyroidism of renal origin: Secondary | ICD-10-CM | POA: Diagnosis not present

## 2017-05-25 DIAGNOSIS — E878 Other disorders of electrolyte and fluid balance, not elsewhere classified: Secondary | ICD-10-CM | POA: Diagnosis not present

## 2017-05-25 DIAGNOSIS — D638 Anemia in other chronic diseases classified elsewhere: Secondary | ICD-10-CM | POA: Diagnosis not present

## 2017-05-25 DIAGNOSIS — D509 Iron deficiency anemia, unspecified: Secondary | ICD-10-CM | POA: Diagnosis not present

## 2017-05-26 DIAGNOSIS — D509 Iron deficiency anemia, unspecified: Secondary | ICD-10-CM | POA: Diagnosis not present

## 2017-05-26 DIAGNOSIS — N186 End stage renal disease: Secondary | ICD-10-CM | POA: Diagnosis not present

## 2017-05-26 DIAGNOSIS — E878 Other disorders of electrolyte and fluid balance, not elsewhere classified: Secondary | ICD-10-CM | POA: Diagnosis not present

## 2017-05-26 DIAGNOSIS — Z4932 Encounter for adequacy testing for peritoneal dialysis: Secondary | ICD-10-CM | POA: Diagnosis not present

## 2017-05-26 DIAGNOSIS — N2581 Secondary hyperparathyroidism of renal origin: Secondary | ICD-10-CM | POA: Diagnosis not present

## 2017-05-26 DIAGNOSIS — D638 Anemia in other chronic diseases classified elsewhere: Secondary | ICD-10-CM | POA: Diagnosis not present

## 2017-05-27 DIAGNOSIS — N186 End stage renal disease: Secondary | ICD-10-CM | POA: Diagnosis not present

## 2017-05-27 DIAGNOSIS — N2581 Secondary hyperparathyroidism of renal origin: Secondary | ICD-10-CM | POA: Diagnosis not present

## 2017-05-27 DIAGNOSIS — D638 Anemia in other chronic diseases classified elsewhere: Secondary | ICD-10-CM | POA: Diagnosis not present

## 2017-05-27 DIAGNOSIS — E878 Other disorders of electrolyte and fluid balance, not elsewhere classified: Secondary | ICD-10-CM | POA: Diagnosis not present

## 2017-05-27 DIAGNOSIS — Z4932 Encounter for adequacy testing for peritoneal dialysis: Secondary | ICD-10-CM | POA: Diagnosis not present

## 2017-05-27 DIAGNOSIS — D509 Iron deficiency anemia, unspecified: Secondary | ICD-10-CM | POA: Diagnosis not present

## 2017-05-28 DIAGNOSIS — N2581 Secondary hyperparathyroidism of renal origin: Secondary | ICD-10-CM | POA: Diagnosis not present

## 2017-05-28 DIAGNOSIS — E878 Other disorders of electrolyte and fluid balance, not elsewhere classified: Secondary | ICD-10-CM | POA: Diagnosis not present

## 2017-05-28 DIAGNOSIS — Z4932 Encounter for adequacy testing for peritoneal dialysis: Secondary | ICD-10-CM | POA: Diagnosis not present

## 2017-05-28 DIAGNOSIS — D638 Anemia in other chronic diseases classified elsewhere: Secondary | ICD-10-CM | POA: Diagnosis not present

## 2017-05-28 DIAGNOSIS — N186 End stage renal disease: Secondary | ICD-10-CM | POA: Diagnosis not present

## 2017-05-28 DIAGNOSIS — D509 Iron deficiency anemia, unspecified: Secondary | ICD-10-CM | POA: Diagnosis not present

## 2017-05-29 DIAGNOSIS — Z4932 Encounter for adequacy testing for peritoneal dialysis: Secondary | ICD-10-CM | POA: Diagnosis not present

## 2017-05-29 DIAGNOSIS — N2581 Secondary hyperparathyroidism of renal origin: Secondary | ICD-10-CM | POA: Diagnosis not present

## 2017-05-29 DIAGNOSIS — D638 Anemia in other chronic diseases classified elsewhere: Secondary | ICD-10-CM | POA: Diagnosis not present

## 2017-05-29 DIAGNOSIS — N186 End stage renal disease: Secondary | ICD-10-CM | POA: Diagnosis not present

## 2017-05-29 DIAGNOSIS — D509 Iron deficiency anemia, unspecified: Secondary | ICD-10-CM | POA: Diagnosis not present

## 2017-05-29 DIAGNOSIS — E878 Other disorders of electrolyte and fluid balance, not elsewhere classified: Secondary | ICD-10-CM | POA: Diagnosis not present

## 2017-05-30 DIAGNOSIS — E878 Other disorders of electrolyte and fluid balance, not elsewhere classified: Secondary | ICD-10-CM | POA: Diagnosis not present

## 2017-05-30 DIAGNOSIS — N2581 Secondary hyperparathyroidism of renal origin: Secondary | ICD-10-CM | POA: Diagnosis not present

## 2017-05-30 DIAGNOSIS — D638 Anemia in other chronic diseases classified elsewhere: Secondary | ICD-10-CM | POA: Diagnosis not present

## 2017-05-30 DIAGNOSIS — N186 End stage renal disease: Secondary | ICD-10-CM | POA: Diagnosis not present

## 2017-05-30 DIAGNOSIS — Z4932 Encounter for adequacy testing for peritoneal dialysis: Secondary | ICD-10-CM | POA: Diagnosis not present

## 2017-05-30 DIAGNOSIS — D509 Iron deficiency anemia, unspecified: Secondary | ICD-10-CM | POA: Diagnosis not present

## 2017-05-31 DIAGNOSIS — N2581 Secondary hyperparathyroidism of renal origin: Secondary | ICD-10-CM | POA: Diagnosis not present

## 2017-05-31 DIAGNOSIS — Z4932 Encounter for adequacy testing for peritoneal dialysis: Secondary | ICD-10-CM | POA: Diagnosis not present

## 2017-05-31 DIAGNOSIS — E878 Other disorders of electrolyte and fluid balance, not elsewhere classified: Secondary | ICD-10-CM | POA: Diagnosis not present

## 2017-05-31 DIAGNOSIS — D638 Anemia in other chronic diseases classified elsewhere: Secondary | ICD-10-CM | POA: Diagnosis not present

## 2017-05-31 DIAGNOSIS — D509 Iron deficiency anemia, unspecified: Secondary | ICD-10-CM | POA: Diagnosis not present

## 2017-05-31 DIAGNOSIS — N186 End stage renal disease: Secondary | ICD-10-CM | POA: Diagnosis not present

## 2017-06-01 ENCOUNTER — Ambulatory Visit: Payer: Medicare Other | Admitting: Occupational Therapy

## 2017-06-01 ENCOUNTER — Ambulatory Visit: Payer: Medicare Other | Admitting: Physical Therapy

## 2017-06-01 DIAGNOSIS — N2581 Secondary hyperparathyroidism of renal origin: Secondary | ICD-10-CM | POA: Diagnosis not present

## 2017-06-01 DIAGNOSIS — D638 Anemia in other chronic diseases classified elsewhere: Secondary | ICD-10-CM | POA: Diagnosis not present

## 2017-06-01 DIAGNOSIS — Z4932 Encounter for adequacy testing for peritoneal dialysis: Secondary | ICD-10-CM | POA: Diagnosis not present

## 2017-06-01 DIAGNOSIS — N186 End stage renal disease: Secondary | ICD-10-CM | POA: Diagnosis not present

## 2017-06-01 DIAGNOSIS — D509 Iron deficiency anemia, unspecified: Secondary | ICD-10-CM | POA: Diagnosis not present

## 2017-06-01 DIAGNOSIS — E878 Other disorders of electrolyte and fluid balance, not elsewhere classified: Secondary | ICD-10-CM | POA: Diagnosis not present

## 2017-06-02 DIAGNOSIS — E878 Other disorders of electrolyte and fluid balance, not elsewhere classified: Secondary | ICD-10-CM | POA: Diagnosis not present

## 2017-06-02 DIAGNOSIS — Z4932 Encounter for adequacy testing for peritoneal dialysis: Secondary | ICD-10-CM | POA: Diagnosis not present

## 2017-06-02 DIAGNOSIS — D638 Anemia in other chronic diseases classified elsewhere: Secondary | ICD-10-CM | POA: Diagnosis not present

## 2017-06-02 DIAGNOSIS — N2581 Secondary hyperparathyroidism of renal origin: Secondary | ICD-10-CM | POA: Diagnosis not present

## 2017-06-02 DIAGNOSIS — N186 End stage renal disease: Secondary | ICD-10-CM | POA: Diagnosis not present

## 2017-06-02 DIAGNOSIS — D509 Iron deficiency anemia, unspecified: Secondary | ICD-10-CM | POA: Diagnosis not present

## 2017-06-03 DIAGNOSIS — Z4932 Encounter for adequacy testing for peritoneal dialysis: Secondary | ICD-10-CM | POA: Diagnosis not present

## 2017-06-03 DIAGNOSIS — E878 Other disorders of electrolyte and fluid balance, not elsewhere classified: Secondary | ICD-10-CM | POA: Diagnosis not present

## 2017-06-03 DIAGNOSIS — N186 End stage renal disease: Secondary | ICD-10-CM | POA: Diagnosis not present

## 2017-06-03 DIAGNOSIS — D638 Anemia in other chronic diseases classified elsewhere: Secondary | ICD-10-CM | POA: Diagnosis not present

## 2017-06-03 DIAGNOSIS — N2581 Secondary hyperparathyroidism of renal origin: Secondary | ICD-10-CM | POA: Diagnosis not present

## 2017-06-03 DIAGNOSIS — D509 Iron deficiency anemia, unspecified: Secondary | ICD-10-CM | POA: Diagnosis not present

## 2017-06-04 ENCOUNTER — Ambulatory Visit: Payer: Medicare Other | Admitting: Occupational Therapy

## 2017-06-04 ENCOUNTER — Telehealth: Payer: Self-pay | Admitting: Occupational Therapy

## 2017-06-04 ENCOUNTER — Ambulatory Visit: Payer: Medicare Other | Admitting: Physical Therapy

## 2017-06-04 DIAGNOSIS — Z992 Dependence on renal dialysis: Secondary | ICD-10-CM | POA: Diagnosis not present

## 2017-06-04 DIAGNOSIS — D509 Iron deficiency anemia, unspecified: Secondary | ICD-10-CM | POA: Diagnosis not present

## 2017-06-04 DIAGNOSIS — N186 End stage renal disease: Secondary | ICD-10-CM | POA: Diagnosis not present

## 2017-06-04 DIAGNOSIS — Z4932 Encounter for adequacy testing for peritoneal dialysis: Secondary | ICD-10-CM | POA: Diagnosis not present

## 2017-06-04 DIAGNOSIS — D638 Anemia in other chronic diseases classified elsewhere: Secondary | ICD-10-CM | POA: Diagnosis not present

## 2017-06-04 DIAGNOSIS — N041 Nephrotic syndrome with focal and segmental glomerular lesions: Secondary | ICD-10-CM | POA: Diagnosis not present

## 2017-06-04 DIAGNOSIS — E878 Other disorders of electrolyte and fluid balance, not elsewhere classified: Secondary | ICD-10-CM | POA: Diagnosis not present

## 2017-06-04 DIAGNOSIS — N2581 Secondary hyperparathyroidism of renal origin: Secondary | ICD-10-CM | POA: Diagnosis not present

## 2017-06-04 NOTE — Telephone Encounter (Signed)
Message left with female family member telling pt that she missed her appointment and to please call our office. Contact info left with family member.

## 2017-06-05 DIAGNOSIS — E878 Other disorders of electrolyte and fluid balance, not elsewhere classified: Secondary | ICD-10-CM | POA: Diagnosis not present

## 2017-06-05 DIAGNOSIS — N2581 Secondary hyperparathyroidism of renal origin: Secondary | ICD-10-CM | POA: Diagnosis not present

## 2017-06-05 DIAGNOSIS — D631 Anemia in chronic kidney disease: Secondary | ICD-10-CM | POA: Diagnosis not present

## 2017-06-05 DIAGNOSIS — D509 Iron deficiency anemia, unspecified: Secondary | ICD-10-CM | POA: Diagnosis not present

## 2017-06-05 DIAGNOSIS — Z79899 Other long term (current) drug therapy: Secondary | ICD-10-CM | POA: Diagnosis not present

## 2017-06-05 DIAGNOSIS — N186 End stage renal disease: Secondary | ICD-10-CM | POA: Diagnosis not present

## 2017-06-05 DIAGNOSIS — Z4932 Encounter for adequacy testing for peritoneal dialysis: Secondary | ICD-10-CM | POA: Diagnosis not present

## 2017-06-05 DIAGNOSIS — E440 Moderate protein-calorie malnutrition: Secondary | ICD-10-CM | POA: Diagnosis not present

## 2017-06-05 DIAGNOSIS — Z5189 Encounter for other specified aftercare: Secondary | ICD-10-CM | POA: Diagnosis not present

## 2017-06-05 DIAGNOSIS — R17 Unspecified jaundice: Secondary | ICD-10-CM | POA: Diagnosis not present

## 2017-06-06 DIAGNOSIS — N186 End stage renal disease: Secondary | ICD-10-CM | POA: Diagnosis not present

## 2017-06-06 DIAGNOSIS — D631 Anemia in chronic kidney disease: Secondary | ICD-10-CM | POA: Diagnosis not present

## 2017-06-06 DIAGNOSIS — E440 Moderate protein-calorie malnutrition: Secondary | ICD-10-CM | POA: Diagnosis not present

## 2017-06-06 DIAGNOSIS — E878 Other disorders of electrolyte and fluid balance, not elsewhere classified: Secondary | ICD-10-CM | POA: Diagnosis not present

## 2017-06-06 DIAGNOSIS — Z79899 Other long term (current) drug therapy: Secondary | ICD-10-CM | POA: Diagnosis not present

## 2017-06-06 DIAGNOSIS — Z4932 Encounter for adequacy testing for peritoneal dialysis: Secondary | ICD-10-CM | POA: Diagnosis not present

## 2017-06-07 DIAGNOSIS — N186 End stage renal disease: Secondary | ICD-10-CM | POA: Diagnosis not present

## 2017-06-07 DIAGNOSIS — Z79899 Other long term (current) drug therapy: Secondary | ICD-10-CM | POA: Diagnosis not present

## 2017-06-07 DIAGNOSIS — Z4932 Encounter for adequacy testing for peritoneal dialysis: Secondary | ICD-10-CM | POA: Diagnosis not present

## 2017-06-07 DIAGNOSIS — D631 Anemia in chronic kidney disease: Secondary | ICD-10-CM | POA: Diagnosis not present

## 2017-06-07 DIAGNOSIS — E878 Other disorders of electrolyte and fluid balance, not elsewhere classified: Secondary | ICD-10-CM | POA: Diagnosis not present

## 2017-06-07 DIAGNOSIS — E440 Moderate protein-calorie malnutrition: Secondary | ICD-10-CM | POA: Diagnosis not present

## 2017-06-08 DIAGNOSIS — E440 Moderate protein-calorie malnutrition: Secondary | ICD-10-CM | POA: Diagnosis not present

## 2017-06-08 DIAGNOSIS — Z79899 Other long term (current) drug therapy: Secondary | ICD-10-CM | POA: Diagnosis not present

## 2017-06-08 DIAGNOSIS — N186 End stage renal disease: Secondary | ICD-10-CM | POA: Diagnosis not present

## 2017-06-08 DIAGNOSIS — Z4932 Encounter for adequacy testing for peritoneal dialysis: Secondary | ICD-10-CM | POA: Diagnosis not present

## 2017-06-08 DIAGNOSIS — E878 Other disorders of electrolyte and fluid balance, not elsewhere classified: Secondary | ICD-10-CM | POA: Diagnosis not present

## 2017-06-08 DIAGNOSIS — D631 Anemia in chronic kidney disease: Secondary | ICD-10-CM | POA: Diagnosis not present

## 2017-06-09 ENCOUNTER — Ambulatory Visit: Payer: Medicare Other | Admitting: Occupational Therapy

## 2017-06-09 ENCOUNTER — Ambulatory Visit: Payer: Medicare Other | Admitting: Physical Therapy

## 2017-06-09 DIAGNOSIS — D631 Anemia in chronic kidney disease: Secondary | ICD-10-CM | POA: Diagnosis not present

## 2017-06-09 DIAGNOSIS — R82998 Other abnormal findings in urine: Secondary | ICD-10-CM | POA: Diagnosis not present

## 2017-06-09 DIAGNOSIS — E878 Other disorders of electrolyte and fluid balance, not elsewhere classified: Secondary | ICD-10-CM | POA: Diagnosis not present

## 2017-06-09 DIAGNOSIS — Z4932 Encounter for adequacy testing for peritoneal dialysis: Secondary | ICD-10-CM | POA: Diagnosis not present

## 2017-06-09 DIAGNOSIS — N186 End stage renal disease: Secondary | ICD-10-CM | POA: Diagnosis not present

## 2017-06-09 DIAGNOSIS — Z79899 Other long term (current) drug therapy: Secondary | ICD-10-CM | POA: Diagnosis not present

## 2017-06-09 DIAGNOSIS — E890 Postprocedural hypothyroidism: Secondary | ICD-10-CM | POA: Diagnosis not present

## 2017-06-09 DIAGNOSIS — E440 Moderate protein-calorie malnutrition: Secondary | ICD-10-CM | POA: Diagnosis not present

## 2017-06-10 DIAGNOSIS — E440 Moderate protein-calorie malnutrition: Secondary | ICD-10-CM | POA: Diagnosis not present

## 2017-06-10 DIAGNOSIS — Z4932 Encounter for adequacy testing for peritoneal dialysis: Secondary | ICD-10-CM | POA: Diagnosis not present

## 2017-06-10 DIAGNOSIS — D631 Anemia in chronic kidney disease: Secondary | ICD-10-CM | POA: Diagnosis not present

## 2017-06-10 DIAGNOSIS — Z79899 Other long term (current) drug therapy: Secondary | ICD-10-CM | POA: Diagnosis not present

## 2017-06-10 DIAGNOSIS — E878 Other disorders of electrolyte and fluid balance, not elsewhere classified: Secondary | ICD-10-CM | POA: Diagnosis not present

## 2017-06-10 DIAGNOSIS — N186 End stage renal disease: Secondary | ICD-10-CM | POA: Diagnosis not present

## 2017-06-11 ENCOUNTER — Encounter: Payer: Self-pay | Admitting: Physical Therapy

## 2017-06-11 ENCOUNTER — Ambulatory Visit: Payer: Medicare Other | Admitting: Occupational Therapy

## 2017-06-11 ENCOUNTER — Ambulatory Visit: Payer: Medicare Other | Admitting: Physical Therapy

## 2017-06-11 DIAGNOSIS — D631 Anemia in chronic kidney disease: Secondary | ICD-10-CM | POA: Diagnosis not present

## 2017-06-11 DIAGNOSIS — R278 Other lack of coordination: Secondary | ICD-10-CM | POA: Insufficient documentation

## 2017-06-11 DIAGNOSIS — I69052 Hemiplegia and hemiparesis following nontraumatic subarachnoid hemorrhage affecting left dominant side: Secondary | ICD-10-CM | POA: Insufficient documentation

## 2017-06-11 DIAGNOSIS — R2681 Unsteadiness on feet: Secondary | ICD-10-CM | POA: Insufficient documentation

## 2017-06-11 DIAGNOSIS — Z4932 Encounter for adequacy testing for peritoneal dialysis: Secondary | ICD-10-CM | POA: Diagnosis not present

## 2017-06-11 DIAGNOSIS — M6281 Muscle weakness (generalized): Secondary | ICD-10-CM | POA: Insufficient documentation

## 2017-06-11 DIAGNOSIS — Z79899 Other long term (current) drug therapy: Secondary | ICD-10-CM | POA: Diagnosis not present

## 2017-06-11 DIAGNOSIS — R2689 Other abnormalities of gait and mobility: Secondary | ICD-10-CM | POA: Insufficient documentation

## 2017-06-11 DIAGNOSIS — R29898 Other symptoms and signs involving the musculoskeletal system: Secondary | ICD-10-CM | POA: Insufficient documentation

## 2017-06-11 DIAGNOSIS — E878 Other disorders of electrolyte and fluid balance, not elsewhere classified: Secondary | ICD-10-CM | POA: Diagnosis not present

## 2017-06-11 DIAGNOSIS — R296 Repeated falls: Secondary | ICD-10-CM | POA: Insufficient documentation

## 2017-06-11 DIAGNOSIS — E440 Moderate protein-calorie malnutrition: Secondary | ICD-10-CM | POA: Diagnosis not present

## 2017-06-11 DIAGNOSIS — I69254 Hemiplegia and hemiparesis following other nontraumatic intracranial hemorrhage affecting left non-dominant side: Secondary | ICD-10-CM | POA: Insufficient documentation

## 2017-06-11 DIAGNOSIS — M25622 Stiffness of left elbow, not elsewhere classified: Secondary | ICD-10-CM | POA: Insufficient documentation

## 2017-06-11 DIAGNOSIS — N186 End stage renal disease: Secondary | ICD-10-CM | POA: Diagnosis not present

## 2017-06-11 NOTE — Therapy (Signed)
[facility] [address] [place], [place], [address] Phone: [phone]   Fax:  [phone]  Patient Details  Name: SOLANA COGGIN MRN: [id number] Date of Birth: 1966-05-28 Referring Provider:  No ref. provider found  Encounter Date: 06/14/2017  PHYSICAL THERAPY DISCHARGE SUMMARY  Visits from Start of Care: 1  Current functional level related to goals / functional outcomes: See evaluation; pt did not return to therapy after initial evaluation despite multiple phone calls.  Following third no-show with no return phone call, pt is being D/C from PT at this time.   Remaining deficits: See PT evaluation   Education / Equipment: None  Plan: Patient agrees to discharge.  Patient goals were not met. Patient is being discharged due to not returning since the last visit.  ?????    G-Codes - 2017-06-14 1708    Functional Assessment Tool Used (Outpatient Only)  five time sit to stand 13.9 sec, gait velocity 1.49 ft/sec, FOTO: 53%/47% impaired    Functional Limitation  Mobility: Walking and moving around    Mobility: Walking and Moving Around Current Status [phone])  At least 40 percent but less than 60 percent impaired, limited or restricted    Mobility: Walking and Moving Around Goal Status [phone])  At least 20 percent but less than 40 percent impaired, limited or restricted    Mobility: Walking and Moving Around Discharge Status [phone])  At least 40 percent but less than 60 percent impaired, limited or restricted        Rico Junker, PT, DPT 2017/06/14    5:11 PM   [facility] [address] [place], [place], [address] Phone: [phone]   Fax:  [phone]

## 2017-06-12 DIAGNOSIS — E440 Moderate protein-calorie malnutrition: Secondary | ICD-10-CM | POA: Diagnosis not present

## 2017-06-12 DIAGNOSIS — N186 End stage renal disease: Secondary | ICD-10-CM | POA: Diagnosis not present

## 2017-06-12 DIAGNOSIS — D631 Anemia in chronic kidney disease: Secondary | ICD-10-CM | POA: Diagnosis not present

## 2017-06-12 DIAGNOSIS — Z4932 Encounter for adequacy testing for peritoneal dialysis: Secondary | ICD-10-CM | POA: Diagnosis not present

## 2017-06-12 DIAGNOSIS — Z79899 Other long term (current) drug therapy: Secondary | ICD-10-CM | POA: Diagnosis not present

## 2017-06-12 DIAGNOSIS — E878 Other disorders of electrolyte and fluid balance, not elsewhere classified: Secondary | ICD-10-CM | POA: Diagnosis not present

## 2017-06-13 DIAGNOSIS — E878 Other disorders of electrolyte and fluid balance, not elsewhere classified: Secondary | ICD-10-CM | POA: Diagnosis not present

## 2017-06-13 DIAGNOSIS — D631 Anemia in chronic kidney disease: Secondary | ICD-10-CM | POA: Diagnosis not present

## 2017-06-13 DIAGNOSIS — E440 Moderate protein-calorie malnutrition: Secondary | ICD-10-CM | POA: Diagnosis not present

## 2017-06-13 DIAGNOSIS — Z4932 Encounter for adequacy testing for peritoneal dialysis: Secondary | ICD-10-CM | POA: Diagnosis not present

## 2017-06-13 DIAGNOSIS — Z79899 Other long term (current) drug therapy: Secondary | ICD-10-CM | POA: Diagnosis not present

## 2017-06-13 DIAGNOSIS — N186 End stage renal disease: Secondary | ICD-10-CM | POA: Diagnosis not present

## 2017-06-14 ENCOUNTER — Ambulatory Visit: Payer: Medicare Other | Admitting: Occupational Therapy

## 2017-06-14 ENCOUNTER — Ambulatory Visit: Payer: Medicare Other | Admitting: Physical Therapy

## 2017-06-14 DIAGNOSIS — N186 End stage renal disease: Secondary | ICD-10-CM | POA: Diagnosis not present

## 2017-06-14 DIAGNOSIS — Z4932 Encounter for adequacy testing for peritoneal dialysis: Secondary | ICD-10-CM | POA: Diagnosis not present

## 2017-06-14 DIAGNOSIS — D631 Anemia in chronic kidney disease: Secondary | ICD-10-CM | POA: Diagnosis not present

## 2017-06-14 DIAGNOSIS — Z79899 Other long term (current) drug therapy: Secondary | ICD-10-CM | POA: Diagnosis not present

## 2017-06-14 DIAGNOSIS — E878 Other disorders of electrolyte and fluid balance, not elsewhere classified: Secondary | ICD-10-CM | POA: Diagnosis not present

## 2017-06-14 DIAGNOSIS — E440 Moderate protein-calorie malnutrition: Secondary | ICD-10-CM | POA: Diagnosis not present

## 2017-06-15 ENCOUNTER — Ambulatory Visit: Payer: Medicare Other | Admitting: Physical Medicine & Rehabilitation

## 2017-06-15 DIAGNOSIS — D631 Anemia in chronic kidney disease: Secondary | ICD-10-CM | POA: Diagnosis not present

## 2017-06-15 DIAGNOSIS — E440 Moderate protein-calorie malnutrition: Secondary | ICD-10-CM | POA: Diagnosis not present

## 2017-06-15 DIAGNOSIS — N186 End stage renal disease: Secondary | ICD-10-CM | POA: Diagnosis not present

## 2017-06-15 DIAGNOSIS — E878 Other disorders of electrolyte and fluid balance, not elsewhere classified: Secondary | ICD-10-CM | POA: Diagnosis not present

## 2017-06-15 DIAGNOSIS — Z79899 Other long term (current) drug therapy: Secondary | ICD-10-CM | POA: Diagnosis not present

## 2017-06-15 DIAGNOSIS — Z4932 Encounter for adequacy testing for peritoneal dialysis: Secondary | ICD-10-CM | POA: Diagnosis not present

## 2017-06-16 ENCOUNTER — Ambulatory Visit: Payer: Medicare Other | Admitting: Physical Therapy

## 2017-06-16 ENCOUNTER — Encounter: Payer: Medicare Other | Admitting: Occupational Therapy

## 2017-06-16 DIAGNOSIS — Z4932 Encounter for adequacy testing for peritoneal dialysis: Secondary | ICD-10-CM | POA: Diagnosis not present

## 2017-06-16 DIAGNOSIS — Z79899 Other long term (current) drug therapy: Secondary | ICD-10-CM | POA: Diagnosis not present

## 2017-06-16 DIAGNOSIS — N186 End stage renal disease: Secondary | ICD-10-CM | POA: Diagnosis not present

## 2017-06-16 DIAGNOSIS — E878 Other disorders of electrolyte and fluid balance, not elsewhere classified: Secondary | ICD-10-CM | POA: Diagnosis not present

## 2017-06-16 DIAGNOSIS — D631 Anemia in chronic kidney disease: Secondary | ICD-10-CM | POA: Diagnosis not present

## 2017-06-16 DIAGNOSIS — E440 Moderate protein-calorie malnutrition: Secondary | ICD-10-CM | POA: Diagnosis not present

## 2017-06-17 DIAGNOSIS — D631 Anemia in chronic kidney disease: Secondary | ICD-10-CM | POA: Diagnosis not present

## 2017-06-17 DIAGNOSIS — E440 Moderate protein-calorie malnutrition: Secondary | ICD-10-CM | POA: Diagnosis not present

## 2017-06-17 DIAGNOSIS — E878 Other disorders of electrolyte and fluid balance, not elsewhere classified: Secondary | ICD-10-CM | POA: Diagnosis not present

## 2017-06-17 DIAGNOSIS — Z79899 Other long term (current) drug therapy: Secondary | ICD-10-CM | POA: Diagnosis not present

## 2017-06-17 DIAGNOSIS — Z4932 Encounter for adequacy testing for peritoneal dialysis: Secondary | ICD-10-CM | POA: Diagnosis not present

## 2017-06-17 DIAGNOSIS — N186 End stage renal disease: Secondary | ICD-10-CM | POA: Diagnosis not present

## 2017-06-18 DIAGNOSIS — E440 Moderate protein-calorie malnutrition: Secondary | ICD-10-CM | POA: Diagnosis not present

## 2017-06-18 DIAGNOSIS — N186 End stage renal disease: Secondary | ICD-10-CM | POA: Diagnosis not present

## 2017-06-18 DIAGNOSIS — Z79899 Other long term (current) drug therapy: Secondary | ICD-10-CM | POA: Diagnosis not present

## 2017-06-18 DIAGNOSIS — D631 Anemia in chronic kidney disease: Secondary | ICD-10-CM | POA: Diagnosis not present

## 2017-06-18 DIAGNOSIS — Z4932 Encounter for adequacy testing for peritoneal dialysis: Secondary | ICD-10-CM | POA: Diagnosis not present

## 2017-06-18 DIAGNOSIS — E878 Other disorders of electrolyte and fluid balance, not elsewhere classified: Secondary | ICD-10-CM | POA: Diagnosis not present

## 2017-06-19 DIAGNOSIS — E440 Moderate protein-calorie malnutrition: Secondary | ICD-10-CM | POA: Diagnosis not present

## 2017-06-19 DIAGNOSIS — Z79899 Other long term (current) drug therapy: Secondary | ICD-10-CM | POA: Diagnosis not present

## 2017-06-19 DIAGNOSIS — N186 End stage renal disease: Secondary | ICD-10-CM | POA: Diagnosis not present

## 2017-06-19 DIAGNOSIS — Z4932 Encounter for adequacy testing for peritoneal dialysis: Secondary | ICD-10-CM | POA: Diagnosis not present

## 2017-06-19 DIAGNOSIS — D631 Anemia in chronic kidney disease: Secondary | ICD-10-CM | POA: Diagnosis not present

## 2017-06-19 DIAGNOSIS — E878 Other disorders of electrolyte and fluid balance, not elsewhere classified: Secondary | ICD-10-CM | POA: Diagnosis not present

## 2017-06-20 DIAGNOSIS — Z4932 Encounter for adequacy testing for peritoneal dialysis: Secondary | ICD-10-CM | POA: Diagnosis not present

## 2017-06-20 DIAGNOSIS — E878 Other disorders of electrolyte and fluid balance, not elsewhere classified: Secondary | ICD-10-CM | POA: Diagnosis not present

## 2017-06-20 DIAGNOSIS — Z79899 Other long term (current) drug therapy: Secondary | ICD-10-CM | POA: Diagnosis not present

## 2017-06-20 DIAGNOSIS — D631 Anemia in chronic kidney disease: Secondary | ICD-10-CM | POA: Diagnosis not present

## 2017-06-20 DIAGNOSIS — N186 End stage renal disease: Secondary | ICD-10-CM | POA: Diagnosis not present

## 2017-06-20 DIAGNOSIS — E440 Moderate protein-calorie malnutrition: Secondary | ICD-10-CM | POA: Diagnosis not present

## 2017-06-21 ENCOUNTER — Encounter: Payer: Medicare Other | Admitting: Occupational Therapy

## 2017-06-21 ENCOUNTER — Ambulatory Visit: Payer: Medicare Other | Admitting: Physical Therapy

## 2017-06-21 DIAGNOSIS — E878 Other disorders of electrolyte and fluid balance, not elsewhere classified: Secondary | ICD-10-CM | POA: Diagnosis not present

## 2017-06-21 DIAGNOSIS — N186 End stage renal disease: Secondary | ICD-10-CM | POA: Diagnosis not present

## 2017-06-21 DIAGNOSIS — Z79899 Other long term (current) drug therapy: Secondary | ICD-10-CM | POA: Diagnosis not present

## 2017-06-21 DIAGNOSIS — D631 Anemia in chronic kidney disease: Secondary | ICD-10-CM | POA: Diagnosis not present

## 2017-06-21 DIAGNOSIS — E440 Moderate protein-calorie malnutrition: Secondary | ICD-10-CM | POA: Diagnosis not present

## 2017-06-21 DIAGNOSIS — Z4932 Encounter for adequacy testing for peritoneal dialysis: Secondary | ICD-10-CM | POA: Diagnosis not present

## 2017-06-22 DIAGNOSIS — Z4932 Encounter for adequacy testing for peritoneal dialysis: Secondary | ICD-10-CM | POA: Diagnosis not present

## 2017-06-22 DIAGNOSIS — E878 Other disorders of electrolyte and fluid balance, not elsewhere classified: Secondary | ICD-10-CM | POA: Diagnosis not present

## 2017-06-22 DIAGNOSIS — E440 Moderate protein-calorie malnutrition: Secondary | ICD-10-CM | POA: Diagnosis not present

## 2017-06-22 DIAGNOSIS — Z79899 Other long term (current) drug therapy: Secondary | ICD-10-CM | POA: Diagnosis not present

## 2017-06-22 DIAGNOSIS — D631 Anemia in chronic kidney disease: Secondary | ICD-10-CM | POA: Diagnosis not present

## 2017-06-22 DIAGNOSIS — N186 End stage renal disease: Secondary | ICD-10-CM | POA: Diagnosis not present

## 2017-06-23 ENCOUNTER — Encounter: Payer: Medicare Other | Admitting: Occupational Therapy

## 2017-06-23 ENCOUNTER — Ambulatory Visit: Payer: Medicare Other | Admitting: Physical Therapy

## 2017-06-23 DIAGNOSIS — D631 Anemia in chronic kidney disease: Secondary | ICD-10-CM | POA: Diagnosis not present

## 2017-06-23 DIAGNOSIS — E878 Other disorders of electrolyte and fluid balance, not elsewhere classified: Secondary | ICD-10-CM | POA: Diagnosis not present

## 2017-06-23 DIAGNOSIS — E440 Moderate protein-calorie malnutrition: Secondary | ICD-10-CM | POA: Diagnosis not present

## 2017-06-23 DIAGNOSIS — Z79899 Other long term (current) drug therapy: Secondary | ICD-10-CM | POA: Diagnosis not present

## 2017-06-23 DIAGNOSIS — Z4932 Encounter for adequacy testing for peritoneal dialysis: Secondary | ICD-10-CM | POA: Diagnosis not present

## 2017-06-23 DIAGNOSIS — N186 End stage renal disease: Secondary | ICD-10-CM | POA: Diagnosis not present

## 2017-06-24 DIAGNOSIS — D631 Anemia in chronic kidney disease: Secondary | ICD-10-CM | POA: Diagnosis not present

## 2017-06-24 DIAGNOSIS — E440 Moderate protein-calorie malnutrition: Secondary | ICD-10-CM | POA: Diagnosis not present

## 2017-06-24 DIAGNOSIS — N186 End stage renal disease: Secondary | ICD-10-CM | POA: Diagnosis not present

## 2017-06-24 DIAGNOSIS — Z79899 Other long term (current) drug therapy: Secondary | ICD-10-CM | POA: Diagnosis not present

## 2017-06-24 DIAGNOSIS — E878 Other disorders of electrolyte and fluid balance, not elsewhere classified: Secondary | ICD-10-CM | POA: Diagnosis not present

## 2017-06-24 DIAGNOSIS — Z4932 Encounter for adequacy testing for peritoneal dialysis: Secondary | ICD-10-CM | POA: Diagnosis not present

## 2017-06-25 DIAGNOSIS — Z79899 Other long term (current) drug therapy: Secondary | ICD-10-CM | POA: Diagnosis not present

## 2017-06-25 DIAGNOSIS — D631 Anemia in chronic kidney disease: Secondary | ICD-10-CM | POA: Diagnosis not present

## 2017-06-25 DIAGNOSIS — N186 End stage renal disease: Secondary | ICD-10-CM | POA: Diagnosis not present

## 2017-06-25 DIAGNOSIS — Z4932 Encounter for adequacy testing for peritoneal dialysis: Secondary | ICD-10-CM | POA: Diagnosis not present

## 2017-06-25 DIAGNOSIS — E440 Moderate protein-calorie malnutrition: Secondary | ICD-10-CM | POA: Diagnosis not present

## 2017-06-25 DIAGNOSIS — E878 Other disorders of electrolyte and fluid balance, not elsewhere classified: Secondary | ICD-10-CM | POA: Diagnosis not present

## 2017-06-26 DIAGNOSIS — N186 End stage renal disease: Secondary | ICD-10-CM | POA: Diagnosis not present

## 2017-06-26 DIAGNOSIS — Z4932 Encounter for adequacy testing for peritoneal dialysis: Secondary | ICD-10-CM | POA: Diagnosis not present

## 2017-06-26 DIAGNOSIS — E440 Moderate protein-calorie malnutrition: Secondary | ICD-10-CM | POA: Diagnosis not present

## 2017-06-26 DIAGNOSIS — E878 Other disorders of electrolyte and fluid balance, not elsewhere classified: Secondary | ICD-10-CM | POA: Diagnosis not present

## 2017-06-26 DIAGNOSIS — Z79899 Other long term (current) drug therapy: Secondary | ICD-10-CM | POA: Diagnosis not present

## 2017-06-26 DIAGNOSIS — D631 Anemia in chronic kidney disease: Secondary | ICD-10-CM | POA: Diagnosis not present

## 2017-06-27 DIAGNOSIS — Z79899 Other long term (current) drug therapy: Secondary | ICD-10-CM | POA: Diagnosis not present

## 2017-06-27 DIAGNOSIS — D631 Anemia in chronic kidney disease: Secondary | ICD-10-CM | POA: Diagnosis not present

## 2017-06-27 DIAGNOSIS — N186 End stage renal disease: Secondary | ICD-10-CM | POA: Diagnosis not present

## 2017-06-27 DIAGNOSIS — E440 Moderate protein-calorie malnutrition: Secondary | ICD-10-CM | POA: Diagnosis not present

## 2017-06-27 DIAGNOSIS — E878 Other disorders of electrolyte and fluid balance, not elsewhere classified: Secondary | ICD-10-CM | POA: Diagnosis not present

## 2017-06-27 DIAGNOSIS — Z4932 Encounter for adequacy testing for peritoneal dialysis: Secondary | ICD-10-CM | POA: Diagnosis not present

## 2017-06-28 DIAGNOSIS — D631 Anemia in chronic kidney disease: Secondary | ICD-10-CM | POA: Diagnosis not present

## 2017-06-28 DIAGNOSIS — Z4932 Encounter for adequacy testing for peritoneal dialysis: Secondary | ICD-10-CM | POA: Diagnosis not present

## 2017-06-28 DIAGNOSIS — E878 Other disorders of electrolyte and fluid balance, not elsewhere classified: Secondary | ICD-10-CM | POA: Diagnosis not present

## 2017-06-28 DIAGNOSIS — N186 End stage renal disease: Secondary | ICD-10-CM | POA: Diagnosis not present

## 2017-06-28 DIAGNOSIS — Z79899 Other long term (current) drug therapy: Secondary | ICD-10-CM | POA: Diagnosis not present

## 2017-06-28 DIAGNOSIS — E440 Moderate protein-calorie malnutrition: Secondary | ICD-10-CM | POA: Diagnosis not present

## 2017-06-29 DIAGNOSIS — E440 Moderate protein-calorie malnutrition: Secondary | ICD-10-CM | POA: Diagnosis not present

## 2017-06-29 DIAGNOSIS — Z4932 Encounter for adequacy testing for peritoneal dialysis: Secondary | ICD-10-CM | POA: Diagnosis not present

## 2017-06-29 DIAGNOSIS — Z79899 Other long term (current) drug therapy: Secondary | ICD-10-CM | POA: Diagnosis not present

## 2017-06-29 DIAGNOSIS — D631 Anemia in chronic kidney disease: Secondary | ICD-10-CM | POA: Diagnosis not present

## 2017-06-29 DIAGNOSIS — N186 End stage renal disease: Secondary | ICD-10-CM | POA: Diagnosis not present

## 2017-06-29 DIAGNOSIS — E878 Other disorders of electrolyte and fluid balance, not elsewhere classified: Secondary | ICD-10-CM | POA: Diagnosis not present

## 2017-06-30 ENCOUNTER — Ambulatory Visit: Payer: Medicare Other | Admitting: Physical Therapy

## 2017-06-30 ENCOUNTER — Encounter: Payer: Medicare Other | Admitting: Occupational Therapy

## 2017-06-30 DIAGNOSIS — N186 End stage renal disease: Secondary | ICD-10-CM | POA: Diagnosis not present

## 2017-06-30 DIAGNOSIS — E440 Moderate protein-calorie malnutrition: Secondary | ICD-10-CM | POA: Diagnosis not present

## 2017-06-30 DIAGNOSIS — D631 Anemia in chronic kidney disease: Secondary | ICD-10-CM | POA: Diagnosis not present

## 2017-06-30 DIAGNOSIS — E878 Other disorders of electrolyte and fluid balance, not elsewhere classified: Secondary | ICD-10-CM | POA: Diagnosis not present

## 2017-06-30 DIAGNOSIS — Z79899 Other long term (current) drug therapy: Secondary | ICD-10-CM | POA: Diagnosis not present

## 2017-06-30 DIAGNOSIS — Z4932 Encounter for adequacy testing for peritoneal dialysis: Secondary | ICD-10-CM | POA: Diagnosis not present

## 2017-07-01 ENCOUNTER — Encounter: Payer: Medicare Other | Admitting: Occupational Therapy

## 2017-07-01 ENCOUNTER — Ambulatory Visit: Payer: Medicare Other | Admitting: Physical Therapy

## 2017-07-01 DIAGNOSIS — Z79899 Other long term (current) drug therapy: Secondary | ICD-10-CM | POA: Diagnosis not present

## 2017-07-01 DIAGNOSIS — D631 Anemia in chronic kidney disease: Secondary | ICD-10-CM | POA: Diagnosis not present

## 2017-07-01 DIAGNOSIS — N186 End stage renal disease: Secondary | ICD-10-CM | POA: Diagnosis not present

## 2017-07-01 DIAGNOSIS — E440 Moderate protein-calorie malnutrition: Secondary | ICD-10-CM | POA: Diagnosis not present

## 2017-07-01 DIAGNOSIS — Z4932 Encounter for adequacy testing for peritoneal dialysis: Secondary | ICD-10-CM | POA: Diagnosis not present

## 2017-07-01 DIAGNOSIS — E878 Other disorders of electrolyte and fluid balance, not elsewhere classified: Secondary | ICD-10-CM | POA: Diagnosis not present

## 2017-07-02 DIAGNOSIS — Z79899 Other long term (current) drug therapy: Secondary | ICD-10-CM | POA: Diagnosis not present

## 2017-07-02 DIAGNOSIS — N186 End stage renal disease: Secondary | ICD-10-CM | POA: Diagnosis not present

## 2017-07-02 DIAGNOSIS — Z4932 Encounter for adequacy testing for peritoneal dialysis: Secondary | ICD-10-CM | POA: Diagnosis not present

## 2017-07-02 DIAGNOSIS — D631 Anemia in chronic kidney disease: Secondary | ICD-10-CM | POA: Diagnosis not present

## 2017-07-02 DIAGNOSIS — E440 Moderate protein-calorie malnutrition: Secondary | ICD-10-CM | POA: Diagnosis not present

## 2017-07-02 DIAGNOSIS — E878 Other disorders of electrolyte and fluid balance, not elsewhere classified: Secondary | ICD-10-CM | POA: Diagnosis not present

## 2017-07-03 DIAGNOSIS — D631 Anemia in chronic kidney disease: Secondary | ICD-10-CM | POA: Diagnosis not present

## 2017-07-03 DIAGNOSIS — Z79899 Other long term (current) drug therapy: Secondary | ICD-10-CM | POA: Diagnosis not present

## 2017-07-03 DIAGNOSIS — Z4932 Encounter for adequacy testing for peritoneal dialysis: Secondary | ICD-10-CM | POA: Diagnosis not present

## 2017-07-03 DIAGNOSIS — E440 Moderate protein-calorie malnutrition: Secondary | ICD-10-CM | POA: Diagnosis not present

## 2017-07-03 DIAGNOSIS — N186 End stage renal disease: Secondary | ICD-10-CM | POA: Diagnosis not present

## 2017-07-03 DIAGNOSIS — E878 Other disorders of electrolyte and fluid balance, not elsewhere classified: Secondary | ICD-10-CM | POA: Diagnosis not present

## 2017-07-04 DIAGNOSIS — N186 End stage renal disease: Secondary | ICD-10-CM | POA: Diagnosis not present

## 2017-07-04 DIAGNOSIS — E440 Moderate protein-calorie malnutrition: Secondary | ICD-10-CM | POA: Diagnosis not present

## 2017-07-04 DIAGNOSIS — D631 Anemia in chronic kidney disease: Secondary | ICD-10-CM | POA: Diagnosis not present

## 2017-07-04 DIAGNOSIS — Z4932 Encounter for adequacy testing for peritoneal dialysis: Secondary | ICD-10-CM | POA: Diagnosis not present

## 2017-07-04 DIAGNOSIS — Z79899 Other long term (current) drug therapy: Secondary | ICD-10-CM | POA: Diagnosis not present

## 2017-07-04 DIAGNOSIS — E878 Other disorders of electrolyte and fluid balance, not elsewhere classified: Secondary | ICD-10-CM | POA: Diagnosis not present

## 2017-07-05 DIAGNOSIS — N041 Nephrotic syndrome with focal and segmental glomerular lesions: Secondary | ICD-10-CM | POA: Diagnosis not present

## 2017-07-05 DIAGNOSIS — D631 Anemia in chronic kidney disease: Secondary | ICD-10-CM | POA: Diagnosis not present

## 2017-07-05 DIAGNOSIS — E440 Moderate protein-calorie malnutrition: Secondary | ICD-10-CM | POA: Diagnosis not present

## 2017-07-05 DIAGNOSIS — Z79899 Other long term (current) drug therapy: Secondary | ICD-10-CM | POA: Diagnosis not present

## 2017-07-05 DIAGNOSIS — N186 End stage renal disease: Secondary | ICD-10-CM | POA: Diagnosis not present

## 2017-07-05 DIAGNOSIS — E878 Other disorders of electrolyte and fluid balance, not elsewhere classified: Secondary | ICD-10-CM | POA: Diagnosis not present

## 2017-07-05 DIAGNOSIS — Z4932 Encounter for adequacy testing for peritoneal dialysis: Secondary | ICD-10-CM | POA: Diagnosis not present

## 2017-07-05 DIAGNOSIS — Z992 Dependence on renal dialysis: Secondary | ICD-10-CM | POA: Diagnosis not present

## 2017-07-06 DIAGNOSIS — N2581 Secondary hyperparathyroidism of renal origin: Secondary | ICD-10-CM | POA: Diagnosis not present

## 2017-07-06 DIAGNOSIS — Z5189 Encounter for other specified aftercare: Secondary | ICD-10-CM | POA: Diagnosis not present

## 2017-07-06 DIAGNOSIS — E878 Other disorders of electrolyte and fluid balance, not elsewhere classified: Secondary | ICD-10-CM | POA: Diagnosis not present

## 2017-07-06 DIAGNOSIS — E440 Moderate protein-calorie malnutrition: Secondary | ICD-10-CM | POA: Diagnosis not present

## 2017-07-06 DIAGNOSIS — D509 Iron deficiency anemia, unspecified: Secondary | ICD-10-CM | POA: Diagnosis not present

## 2017-07-06 DIAGNOSIS — D631 Anemia in chronic kidney disease: Secondary | ICD-10-CM | POA: Diagnosis not present

## 2017-07-06 DIAGNOSIS — R17 Unspecified jaundice: Secondary | ICD-10-CM | POA: Diagnosis not present

## 2017-07-06 DIAGNOSIS — Z4932 Encounter for adequacy testing for peritoneal dialysis: Secondary | ICD-10-CM | POA: Diagnosis not present

## 2017-07-06 DIAGNOSIS — N2589 Other disorders resulting from impaired renal tubular function: Secondary | ICD-10-CM | POA: Diagnosis not present

## 2017-07-06 DIAGNOSIS — J189 Pneumonia, unspecified organism: Secondary | ICD-10-CM

## 2017-07-06 DIAGNOSIS — N186 End stage renal disease: Secondary | ICD-10-CM | POA: Diagnosis not present

## 2017-07-06 HISTORY — DX: Pneumonia, unspecified organism: J18.9

## 2017-07-07 ENCOUNTER — Encounter: Payer: Medicare Other | Admitting: Occupational Therapy

## 2017-07-07 ENCOUNTER — Ambulatory Visit: Payer: Medicare Other | Admitting: Physical Therapy

## 2017-07-07 DIAGNOSIS — R17 Unspecified jaundice: Secondary | ICD-10-CM | POA: Diagnosis not present

## 2017-07-07 DIAGNOSIS — D631 Anemia in chronic kidney disease: Secondary | ICD-10-CM | POA: Diagnosis not present

## 2017-07-07 DIAGNOSIS — N186 End stage renal disease: Secondary | ICD-10-CM | POA: Diagnosis not present

## 2017-07-07 DIAGNOSIS — D509 Iron deficiency anemia, unspecified: Secondary | ICD-10-CM | POA: Diagnosis not present

## 2017-07-07 DIAGNOSIS — E878 Other disorders of electrolyte and fluid balance, not elsewhere classified: Secondary | ICD-10-CM | POA: Diagnosis not present

## 2017-07-08 DIAGNOSIS — E890 Postprocedural hypothyroidism: Secondary | ICD-10-CM | POA: Diagnosis not present

## 2017-07-08 DIAGNOSIS — R17 Unspecified jaundice: Secondary | ICD-10-CM | POA: Diagnosis not present

## 2017-07-08 DIAGNOSIS — N186 End stage renal disease: Secondary | ICD-10-CM | POA: Diagnosis not present

## 2017-07-08 DIAGNOSIS — D509 Iron deficiency anemia, unspecified: Secondary | ICD-10-CM | POA: Diagnosis not present

## 2017-07-08 DIAGNOSIS — R82998 Other abnormal findings in urine: Secondary | ICD-10-CM | POA: Diagnosis not present

## 2017-07-08 DIAGNOSIS — D631 Anemia in chronic kidney disease: Secondary | ICD-10-CM | POA: Diagnosis not present

## 2017-07-08 DIAGNOSIS — E7849 Other hyperlipidemia: Secondary | ICD-10-CM | POA: Diagnosis not present

## 2017-07-08 DIAGNOSIS — E878 Other disorders of electrolyte and fluid balance, not elsewhere classified: Secondary | ICD-10-CM | POA: Diagnosis not present

## 2017-07-09 ENCOUNTER — Encounter: Payer: Medicare Other | Admitting: Occupational Therapy

## 2017-07-09 ENCOUNTER — Encounter: Payer: Self-pay | Admitting: Physical Medicine & Rehabilitation

## 2017-07-09 ENCOUNTER — Ambulatory Visit (HOSPITAL_BASED_OUTPATIENT_CLINIC_OR_DEPARTMENT_OTHER): Payer: Medicare Other | Admitting: Physical Medicine & Rehabilitation

## 2017-07-09 ENCOUNTER — Ambulatory Visit: Payer: Medicare Other | Admitting: Physical Therapy

## 2017-07-09 ENCOUNTER — Encounter: Payer: Medicare Other | Attending: Physical Medicine & Rehabilitation

## 2017-07-09 VITALS — BP 137/85 | HR 79 | Resp 14

## 2017-07-09 DIAGNOSIS — E785 Hyperlipidemia, unspecified: Secondary | ICD-10-CM | POA: Diagnosis not present

## 2017-07-09 DIAGNOSIS — N186 End stage renal disease: Secondary | ICD-10-CM | POA: Insufficient documentation

## 2017-07-09 DIAGNOSIS — I69354 Hemiplegia and hemiparesis following cerebral infarction affecting left non-dominant side: Secondary | ICD-10-CM | POA: Insufficient documentation

## 2017-07-09 DIAGNOSIS — G8114 Spastic hemiplegia affecting left nondominant side: Secondary | ICD-10-CM

## 2017-07-09 DIAGNOSIS — I120 Hypertensive chronic kidney disease with stage 5 chronic kidney disease or end stage renal disease: Secondary | ICD-10-CM | POA: Diagnosis not present

## 2017-07-09 DIAGNOSIS — D631 Anemia in chronic kidney disease: Secondary | ICD-10-CM | POA: Diagnosis not present

## 2017-07-09 DIAGNOSIS — Z992 Dependence on renal dialysis: Secondary | ICD-10-CM | POA: Diagnosis not present

## 2017-07-09 DIAGNOSIS — R17 Unspecified jaundice: Secondary | ICD-10-CM | POA: Diagnosis not present

## 2017-07-09 DIAGNOSIS — D509 Iron deficiency anemia, unspecified: Secondary | ICD-10-CM | POA: Diagnosis not present

## 2017-07-09 DIAGNOSIS — E878 Other disorders of electrolyte and fluid balance, not elsewhere classified: Secondary | ICD-10-CM | POA: Diagnosis not present

## 2017-07-09 NOTE — Patient Instructions (Signed)
Please ask Dr Erlinda Hong about driving  If Left hand and wrist stay loose, will not need to repeat Botox

## 2017-07-09 NOTE — Progress Notes (Signed)
Subjective:    Patient ID: Terry Juarez, female    DOB: [birth year], 52 y.o.   MRN: [id number]  HPI  52 year old female with history of right frontal hemorrhage onset 12/19/2016.  Pt had a seizure at the onset of CVA but had no recurrence since that time per pt report.  No driving and has f/u appt with Neuro, Dr Erlinda Hong on Jan 7 , 2019 Mod I dressing and bathing No falls except a couple weeks ago when [place] door knocked her over Amb without AFO, uses QC inside and outside the house   No balling of fist since Botox on 05/04/2017 FCR50 FCU25 FDS50 FDP50 FPL25  Pain Inventory Average Pain 0 Pain Right Now 0 My pain is no pain  In the last 24 hours, has pain interfered with the following? General activity 0 Relation with others 0 Enjoyment of life 0 What TIME of day is your pain at its worst? no pain Sleep (in general) Good  Pain is worse with: no pain Pain improves with: no pain Relief from Meds: 0  Mobility walk with assistance use a cane how many minutes can you walk? 30 ability to climb steps?  yes do you drive?  no  Function disabled: date disabled .  Neuro/Psych trouble walking spasms  Prior Studies Any changes since last visit?  no  Physicians involved in your care Any changes since last visit?  no   Family History  Problem Relation Age of Onset  . Throat cancer Mother        smoked  . Hypertension Father    Social History   Socioeconomic History  . Marital status: Widowed    Spouse name: None  . Number of children: None  . Years of education: None  . Highest education level: None  Social Needs  . Financial resource strain: None  . Food insecurity - worry: None  . Food insecurity - inability: None  . Transportation needs - medical: None  . Transportation needs - non-medical: None  Occupational History  . None  Tobacco Use  . Smoking status: Never Smoker  . Smokeless tobacco: Never Used  Substance and Sexual Activity  . Alcohol use:  Yes    Alcohol/week: 1.2 oz    Types: 2 Glasses of wine per week    Comment: occ  . Drug use: No  . Sexual activity: Yes  Other Topics Concern  . None  Social History Narrative  . None   Past Surgical History:  Procedure Laterality Date  . AV FISTULA PLACEMENT Right 07/11/2014   Procedure: ARTERIOVENOUS (AV) FISTULA CREATION RIGHT ARM BRACHIO-CEPHALIC WITH ATTEMPTED RADIO-CEPHALIC (AV) FISTULA;  Surgeon: Mal Misty, MD;  Location: [facility];  Service: Vascular;  Laterality: Right;  . COLONOSCOPY WITH PROPOFOL N/A 09/04/2016   Procedure: COLONOSCOPY WITH PROPOFOL;  Surgeon: Carol Ada, MD;  Location: [facility];  Service: Endoscopy;  Laterality: N/A;  . ECTOPIC PREGNANCY SURGERY  1987  . ESOPHAGOGASTRODUODENOSCOPY N/A 07/30/2016   Procedure: ESOPHAGOGASTRODUODENOSCOPY (EGD);  Surgeon: Carol Ada, MD;  Location: [facility] ENDOSCOPY;  Service: Endoscopy;  Laterality: N/A;  Bedside  . ESOPHAGOGASTRODUODENOSCOPY (EGD) WITH PROPOFOL N/A 09/04/2016   Procedure: ESOPHAGOGASTRODUODENOSCOPY (EGD) WITH PROPOFOL;  Surgeon: Carol Ada, MD;  Location: [facility];  Service: Endoscopy;  Laterality: N/A;  . INSERTION OF DIALYSIS CATHETER Right 07/11/2014   Procedure: INSERTION OF DIALYSIS CATHETER IN RIGHT INTERNAL JUGULAR ;  Surgeon: Mal Misty, MD;  Location: [facility];  Service: Vascular;  Laterality: Right;  .  LAPAROSCOPIC NEPHRECTOMY Right 07/25/2014   Procedure: RIGHT LAPAROSCOPIC RADICAL NEPHRECTOMY ;  Surgeon: Ardis Hughs, MD;  Location: [facility];  Service: Urology;  Laterality: Right;  . PATCH ANGIOPLASTY Right 07/11/2014   Procedure: PATCH ANGIOPLASTY OF RIGHT RADIAL ARTERY USING CEPHALIC VEIN.;  Surgeon: Mal Misty, MD;  Location: [facility];  Service: Vascular;  Laterality: Right;  . THROMBECTOMY W/ EMBOLECTOMY Right 07/11/2014   Procedure: THROMBECTOMY OF RIGHT RADIAL ARTERY  ;  Surgeon: Mal Misty, MD;  Location: [facility];  Service: Vascular;  Laterality: Right;   Past Medical History:    Diagnosis Date  . Anemia   . Bruises easily   . Dialysis patient ([facility])   . ESRD on dialysis ([facility])   . Hyperlipidemia   . Hypertension   . Renal disorder    rt renal mass / < functioning of left kidney - being prepared for possible dialysis  . Right renal mass   . Stroke ([facility])    BP 137/85 (BP Location: Left Arm, Patient Position: Sitting, Cuff Size: Normal)   Pulse 79   Resp 14   SpO2 98%   Opioid Risk Score:   Fall Risk Score:  `1  Depression screen PHQ 2/9  Depression screen PHQ 2/9 03/02/2017  Decreased Interest 0  Down, Depressed, Hopeless 0  PHQ - 2 Score 0  Altered sleeping 0  Tired, decreased energy 1  Change in appetite 0  Feeling bad or failure about yourself  0  Trouble concentrating 0  Moving slowly or fidgety/restless 0  Suicidal thoughts 0  PHQ-9 Score 1  Difficult doing work/chores Not difficult at all    Review of Systems  Constitutional: Negative.   HENT: Negative.   Eyes: Negative.   Respiratory: Negative.   Cardiovascular: Negative.   Gastrointestinal: Negative.   Endocrine: Negative.   Genitourinary: Negative.   Musculoskeletal: Positive for gait problem.       Spasms  Skin: Negative.   Allergic/Immunologic: Negative.   Hematological: Bruises/bleeds easily.       Objective:   Physical Exam  Constitutional: She is oriented to person, place, and time. She appears well-developed and well-nourished.  HENT:  Head: Normocephalic and atraumatic.  Eyes: Conjunctivae and EOM are normal. Pupils are equal, round, and reactive to light.  Neck: Normal range of motion.  Neurological: She is alert and oriented to person, place, and time. Coordination and gait abnormal.  Motor strength is 5/5 in the right deltoid, bicep, tricep, grip, hip flexor, knee extensor, ankle dorsiflexor Left upper extremity 3- at the deltoid bicep tricep finger flexors and extensors 2- left hip flexor 4- knee extensor trace ankle dorsiflexor Sensation intact light touch in  bilateral upper and lower limb  Psychiatric: She has a normal mood and affect.  Nursing note and vitals reviewed.  Tone modified Ashworth 2 in the left wrist flexors 1 in the finger flexors 1 at the biceps        Assessment & Plan:  1.  Right basal ganglia hemorrhage with residual left hemiparesis.  She would benefit from outpatient therapy but currently has no transportation since her brother found a job.  If she is cleared by neurology to drive she may resume some outpatient therapy. She would need a spinner knob  2.  Left spastic hemiplegia secondary to CVA.  She has had good results with previous Botox injection last performed approximately 2 months ago.  We will repeat in 1 month if her tone returns to preinjection levels.  Currently her tone is modified Ashworth 1-2

## 2017-07-10 DIAGNOSIS — D631 Anemia in chronic kidney disease: Secondary | ICD-10-CM | POA: Diagnosis not present

## 2017-07-10 DIAGNOSIS — D509 Iron deficiency anemia, unspecified: Secondary | ICD-10-CM | POA: Diagnosis not present

## 2017-07-10 DIAGNOSIS — E878 Other disorders of electrolyte and fluid balance, not elsewhere classified: Secondary | ICD-10-CM | POA: Diagnosis not present

## 2017-07-10 DIAGNOSIS — R17 Unspecified jaundice: Secondary | ICD-10-CM | POA: Diagnosis not present

## 2017-07-10 DIAGNOSIS — N186 End stage renal disease: Secondary | ICD-10-CM | POA: Diagnosis not present

## 2017-07-11 DIAGNOSIS — D631 Anemia in chronic kidney disease: Secondary | ICD-10-CM | POA: Diagnosis not present

## 2017-07-11 DIAGNOSIS — D509 Iron deficiency anemia, unspecified: Secondary | ICD-10-CM | POA: Diagnosis not present

## 2017-07-11 DIAGNOSIS — N186 End stage renal disease: Secondary | ICD-10-CM | POA: Diagnosis not present

## 2017-07-11 DIAGNOSIS — R17 Unspecified jaundice: Secondary | ICD-10-CM | POA: Diagnosis not present

## 2017-07-11 DIAGNOSIS — E878 Other disorders of electrolyte and fluid balance, not elsewhere classified: Secondary | ICD-10-CM | POA: Diagnosis not present

## 2017-07-12 ENCOUNTER — Ambulatory Visit: Payer: Medicare Other | Admitting: Neurology

## 2017-07-12 DIAGNOSIS — D509 Iron deficiency anemia, unspecified: Secondary | ICD-10-CM | POA: Diagnosis not present

## 2017-07-12 DIAGNOSIS — N186 End stage renal disease: Secondary | ICD-10-CM | POA: Diagnosis not present

## 2017-07-12 DIAGNOSIS — D631 Anemia in chronic kidney disease: Secondary | ICD-10-CM | POA: Diagnosis not present

## 2017-07-12 DIAGNOSIS — R17 Unspecified jaundice: Secondary | ICD-10-CM | POA: Diagnosis not present

## 2017-07-12 DIAGNOSIS — E878 Other disorders of electrolyte and fluid balance, not elsewhere classified: Secondary | ICD-10-CM | POA: Diagnosis not present

## 2017-07-13 ENCOUNTER — Encounter: Payer: Medicare Other | Admitting: Occupational Therapy

## 2017-07-13 ENCOUNTER — Ambulatory Visit: Payer: Medicare Other | Admitting: Physical Therapy

## 2017-07-13 ENCOUNTER — Encounter: Payer: Self-pay | Admitting: Neurology

## 2017-07-13 DIAGNOSIS — D631 Anemia in chronic kidney disease: Secondary | ICD-10-CM | POA: Diagnosis not present

## 2017-07-13 DIAGNOSIS — N186 End stage renal disease: Secondary | ICD-10-CM | POA: Diagnosis not present

## 2017-07-13 DIAGNOSIS — D509 Iron deficiency anemia, unspecified: Secondary | ICD-10-CM | POA: Diagnosis not present

## 2017-07-13 DIAGNOSIS — E878 Other disorders of electrolyte and fluid balance, not elsewhere classified: Secondary | ICD-10-CM | POA: Diagnosis not present

## 2017-07-13 DIAGNOSIS — R17 Unspecified jaundice: Secondary | ICD-10-CM | POA: Diagnosis not present

## 2017-07-14 DIAGNOSIS — D509 Iron deficiency anemia, unspecified: Secondary | ICD-10-CM | POA: Diagnosis not present

## 2017-07-14 DIAGNOSIS — N186 End stage renal disease: Secondary | ICD-10-CM | POA: Diagnosis not present

## 2017-07-14 DIAGNOSIS — D631 Anemia in chronic kidney disease: Secondary | ICD-10-CM | POA: Diagnosis not present

## 2017-07-14 DIAGNOSIS — R17 Unspecified jaundice: Secondary | ICD-10-CM | POA: Diagnosis not present

## 2017-07-14 DIAGNOSIS — E878 Other disorders of electrolyte and fluid balance, not elsewhere classified: Secondary | ICD-10-CM | POA: Diagnosis not present

## 2017-07-15 ENCOUNTER — Encounter: Payer: Medicare Other | Admitting: Occupational Therapy

## 2017-07-15 ENCOUNTER — Ambulatory Visit: Payer: Medicare Other | Admitting: Physical Therapy

## 2017-07-15 DIAGNOSIS — N186 End stage renal disease: Secondary | ICD-10-CM | POA: Diagnosis not present

## 2017-07-15 DIAGNOSIS — D631 Anemia in chronic kidney disease: Secondary | ICD-10-CM | POA: Diagnosis not present

## 2017-07-15 DIAGNOSIS — D509 Iron deficiency anemia, unspecified: Secondary | ICD-10-CM | POA: Diagnosis not present

## 2017-07-15 DIAGNOSIS — E878 Other disorders of electrolyte and fluid balance, not elsewhere classified: Secondary | ICD-10-CM | POA: Diagnosis not present

## 2017-07-15 DIAGNOSIS — R17 Unspecified jaundice: Secondary | ICD-10-CM | POA: Diagnosis not present

## 2017-07-16 DIAGNOSIS — D631 Anemia in chronic kidney disease: Secondary | ICD-10-CM | POA: Diagnosis not present

## 2017-07-16 DIAGNOSIS — E878 Other disorders of electrolyte and fluid balance, not elsewhere classified: Secondary | ICD-10-CM | POA: Diagnosis not present

## 2017-07-16 DIAGNOSIS — R17 Unspecified jaundice: Secondary | ICD-10-CM | POA: Diagnosis not present

## 2017-07-16 DIAGNOSIS — N186 End stage renal disease: Secondary | ICD-10-CM | POA: Diagnosis not present

## 2017-07-16 DIAGNOSIS — D509 Iron deficiency anemia, unspecified: Secondary | ICD-10-CM | POA: Diagnosis not present

## 2017-07-17 DIAGNOSIS — R17 Unspecified jaundice: Secondary | ICD-10-CM | POA: Diagnosis not present

## 2017-07-17 DIAGNOSIS — N186 End stage renal disease: Secondary | ICD-10-CM | POA: Diagnosis not present

## 2017-07-17 DIAGNOSIS — E878 Other disorders of electrolyte and fluid balance, not elsewhere classified: Secondary | ICD-10-CM | POA: Diagnosis not present

## 2017-07-17 DIAGNOSIS — D631 Anemia in chronic kidney disease: Secondary | ICD-10-CM | POA: Diagnosis not present

## 2017-07-17 DIAGNOSIS — D509 Iron deficiency anemia, unspecified: Secondary | ICD-10-CM | POA: Diagnosis not present

## 2017-07-18 DIAGNOSIS — D631 Anemia in chronic kidney disease: Secondary | ICD-10-CM | POA: Diagnosis not present

## 2017-07-18 DIAGNOSIS — D509 Iron deficiency anemia, unspecified: Secondary | ICD-10-CM | POA: Diagnosis not present

## 2017-07-18 DIAGNOSIS — E878 Other disorders of electrolyte and fluid balance, not elsewhere classified: Secondary | ICD-10-CM | POA: Diagnosis not present

## 2017-07-18 DIAGNOSIS — R17 Unspecified jaundice: Secondary | ICD-10-CM | POA: Diagnosis not present

## 2017-07-18 DIAGNOSIS — N186 End stage renal disease: Secondary | ICD-10-CM | POA: Diagnosis not present

## 2017-07-19 DIAGNOSIS — N186 End stage renal disease: Secondary | ICD-10-CM | POA: Diagnosis not present

## 2017-07-19 DIAGNOSIS — D509 Iron deficiency anemia, unspecified: Secondary | ICD-10-CM | POA: Diagnosis not present

## 2017-07-19 DIAGNOSIS — D631 Anemia in chronic kidney disease: Secondary | ICD-10-CM | POA: Diagnosis not present

## 2017-07-19 DIAGNOSIS — E878 Other disorders of electrolyte and fluid balance, not elsewhere classified: Secondary | ICD-10-CM | POA: Diagnosis not present

## 2017-07-19 DIAGNOSIS — R17 Unspecified jaundice: Secondary | ICD-10-CM | POA: Diagnosis not present

## 2017-07-20 ENCOUNTER — Encounter: Payer: Medicare Other | Admitting: Occupational Therapy

## 2017-07-20 ENCOUNTER — Ambulatory Visit: Payer: Medicare Other | Admitting: Physical Therapy

## 2017-07-20 DIAGNOSIS — R17 Unspecified jaundice: Secondary | ICD-10-CM | POA: Diagnosis not present

## 2017-07-20 DIAGNOSIS — D509 Iron deficiency anemia, unspecified: Secondary | ICD-10-CM | POA: Diagnosis not present

## 2017-07-20 DIAGNOSIS — D631 Anemia in chronic kidney disease: Secondary | ICD-10-CM | POA: Diagnosis not present

## 2017-07-20 DIAGNOSIS — N186 End stage renal disease: Secondary | ICD-10-CM | POA: Diagnosis not present

## 2017-07-20 DIAGNOSIS — E878 Other disorders of electrolyte and fluid balance, not elsewhere classified: Secondary | ICD-10-CM | POA: Diagnosis not present

## 2017-07-21 DIAGNOSIS — D631 Anemia in chronic kidney disease: Secondary | ICD-10-CM | POA: Diagnosis not present

## 2017-07-21 DIAGNOSIS — R17 Unspecified jaundice: Secondary | ICD-10-CM | POA: Diagnosis not present

## 2017-07-21 DIAGNOSIS — E878 Other disorders of electrolyte and fluid balance, not elsewhere classified: Secondary | ICD-10-CM | POA: Diagnosis not present

## 2017-07-21 DIAGNOSIS — D509 Iron deficiency anemia, unspecified: Secondary | ICD-10-CM | POA: Diagnosis not present

## 2017-07-21 DIAGNOSIS — N186 End stage renal disease: Secondary | ICD-10-CM | POA: Diagnosis not present

## 2017-07-22 ENCOUNTER — Ambulatory Visit: Payer: Medicare Other | Admitting: Physical Therapy

## 2017-07-22 ENCOUNTER — Encounter: Payer: Medicare Other | Admitting: Occupational Therapy

## 2017-07-22 DIAGNOSIS — D631 Anemia in chronic kidney disease: Secondary | ICD-10-CM | POA: Diagnosis not present

## 2017-07-22 DIAGNOSIS — N186 End stage renal disease: Secondary | ICD-10-CM | POA: Diagnosis not present

## 2017-07-22 DIAGNOSIS — E878 Other disorders of electrolyte and fluid balance, not elsewhere classified: Secondary | ICD-10-CM | POA: Diagnosis not present

## 2017-07-22 DIAGNOSIS — R17 Unspecified jaundice: Secondary | ICD-10-CM | POA: Diagnosis not present

## 2017-07-22 DIAGNOSIS — D509 Iron deficiency anemia, unspecified: Secondary | ICD-10-CM | POA: Diagnosis not present

## 2017-07-23 DIAGNOSIS — N186 End stage renal disease: Secondary | ICD-10-CM | POA: Diagnosis not present

## 2017-07-23 DIAGNOSIS — D631 Anemia in chronic kidney disease: Secondary | ICD-10-CM | POA: Diagnosis not present

## 2017-07-23 DIAGNOSIS — E878 Other disorders of electrolyte and fluid balance, not elsewhere classified: Secondary | ICD-10-CM | POA: Diagnosis not present

## 2017-07-23 DIAGNOSIS — D509 Iron deficiency anemia, unspecified: Secondary | ICD-10-CM | POA: Diagnosis not present

## 2017-07-23 DIAGNOSIS — R17 Unspecified jaundice: Secondary | ICD-10-CM | POA: Diagnosis not present

## 2017-07-24 DIAGNOSIS — D509 Iron deficiency anemia, unspecified: Secondary | ICD-10-CM | POA: Diagnosis not present

## 2017-07-24 DIAGNOSIS — N186 End stage renal disease: Secondary | ICD-10-CM | POA: Diagnosis not present

## 2017-07-24 DIAGNOSIS — E878 Other disorders of electrolyte and fluid balance, not elsewhere classified: Secondary | ICD-10-CM | POA: Diagnosis not present

## 2017-07-24 DIAGNOSIS — R17 Unspecified jaundice: Secondary | ICD-10-CM | POA: Diagnosis not present

## 2017-07-24 DIAGNOSIS — D631 Anemia in chronic kidney disease: Secondary | ICD-10-CM | POA: Diagnosis not present

## 2017-07-25 DIAGNOSIS — N186 End stage renal disease: Secondary | ICD-10-CM | POA: Diagnosis not present

## 2017-07-25 DIAGNOSIS — E878 Other disorders of electrolyte and fluid balance, not elsewhere classified: Secondary | ICD-10-CM | POA: Diagnosis not present

## 2017-07-25 DIAGNOSIS — D631 Anemia in chronic kidney disease: Secondary | ICD-10-CM | POA: Diagnosis not present

## 2017-07-25 DIAGNOSIS — R17 Unspecified jaundice: Secondary | ICD-10-CM | POA: Diagnosis not present

## 2017-07-25 DIAGNOSIS — D509 Iron deficiency anemia, unspecified: Secondary | ICD-10-CM | POA: Diagnosis not present

## 2017-07-26 DIAGNOSIS — E878 Other disorders of electrolyte and fluid balance, not elsewhere classified: Secondary | ICD-10-CM | POA: Diagnosis not present

## 2017-07-26 DIAGNOSIS — R17 Unspecified jaundice: Secondary | ICD-10-CM | POA: Diagnosis not present

## 2017-07-26 DIAGNOSIS — D509 Iron deficiency anemia, unspecified: Secondary | ICD-10-CM | POA: Diagnosis not present

## 2017-07-26 DIAGNOSIS — D631 Anemia in chronic kidney disease: Secondary | ICD-10-CM | POA: Diagnosis not present

## 2017-07-26 DIAGNOSIS — N186 End stage renal disease: Secondary | ICD-10-CM | POA: Diagnosis not present

## 2017-07-27 ENCOUNTER — Ambulatory Visit (INDEPENDENT_AMBULATORY_CARE_PROVIDER_SITE_OTHER): Payer: Medicare Other | Admitting: Neurology

## 2017-07-27 ENCOUNTER — Encounter: Payer: Self-pay | Admitting: Neurology

## 2017-07-27 VITALS — BP 148/87 | HR 73 | Ht 65.0 in | Wt 126.6 lb

## 2017-07-27 DIAGNOSIS — N2889 Other specified disorders of kidney and ureter: Secondary | ICD-10-CM

## 2017-07-27 DIAGNOSIS — D631 Anemia in chronic kidney disease: Secondary | ICD-10-CM | POA: Diagnosis not present

## 2017-07-27 DIAGNOSIS — I151 Hypertension secondary to other renal disorders: Secondary | ICD-10-CM | POA: Diagnosis not present

## 2017-07-27 DIAGNOSIS — E878 Other disorders of electrolyte and fluid balance, not elsewhere classified: Secondary | ICD-10-CM | POA: Diagnosis not present

## 2017-07-27 DIAGNOSIS — N186 End stage renal disease: Secondary | ICD-10-CM

## 2017-07-27 DIAGNOSIS — D509 Iron deficiency anemia, unspecified: Secondary | ICD-10-CM | POA: Diagnosis not present

## 2017-07-27 DIAGNOSIS — R17 Unspecified jaundice: Secondary | ICD-10-CM | POA: Diagnosis not present

## 2017-07-27 DIAGNOSIS — R569 Unspecified convulsions: Secondary | ICD-10-CM | POA: Diagnosis not present

## 2017-07-27 DIAGNOSIS — Z992 Dependence on renal dialysis: Secondary | ICD-10-CM

## 2017-07-27 DIAGNOSIS — I629 Nontraumatic intracranial hemorrhage, unspecified: Secondary | ICD-10-CM

## 2017-07-27 NOTE — Patient Instructions (Signed)
-   continue baby ASA 81mg  for stroke prevention.  - continue keppra for seizure precaution.  - able to drive now since no seizure for 6 months. Recommend to drive with family members on board for 2-3 times initially. If both parties feel comfortable of your driving, you can drive alone after. However, you are recommended to drive during the day not at night, no long distance and drive in familiar roads.  - Please maintain seizure precautions.  - check BP at home and record - continue PD at home and follow up with nephrology for BP management - continue aggressive PT/OT to increase strength. Follow up with rehab Dr. Letta Pate. - renal diet and self exercise - follow up in 6 months.

## 2017-07-27 NOTE — Progress Notes (Signed)
STROKE NEUROLOGY FOLLOW UP NOTE  NAME: Terry Juarez DOB: 1965-10-07  REASON FOR VISIT: stroke follow up HISTORY FROM: pt and chart  Today we had the pleasure of seeing Terry Juarez in follow-up at our Neurology Clinic. Pt was accompanied by no one.   History Summary Terry Juarez is a 52 y.o. female with history of a renal cell carcinoma s/p right nephrectomy, hypertension, end-stage renal disease on peritoneal dialysis, hyperlipidemia, and anemia admitted on 12/19/16 for seizure, left-sided weakness, and right frontal headache. CT head showed right posterior frontal lobe acute ICH. MRI head consistent with PRES. CTA unremarkable except FMD of bilateral cervical ICAs. Repeat head CT with stable hematoma. EF 60-65%. EEG diffuse slowing, no seizure activity, put on Keppra. BP eventually controlled with multiple PO meds. Attempted twice for HD, however, patient not able to tolerate, the second attempt even caused cardiac arrest s/p intubation. Switched back to PD. Pt condition gradually improved, but still has left hemiplegia. Discharge to CIR.  04/06/17 follow up - the patient has been doing well. Left hemiplegia much improved. Currently, patient able to walk with cane with left PFO brace. Still has left arm plegia. BP much improved, today 130/86, still on Keppra, no seizures. However, patient has been back to driving although less than 6 months of last seizure. She was told to restrain from driving until 6 months free from seizure. She expressed understanding and willing to do so. Has outpt PT/OT  Interval History During the interval time, pt has been doing well.  Follow with PMR, had a Botox at left upper extremity.  Currently left upper and lower extremity further improved strength.  Not driving now due to possible seizure in 12/2016 during hospitalization.  Not continued outpatient PT/OT due to no transportation at this time.  Continued PD at home, blood pressure was low about 2 weeks  ago at 80-90s.  About the same time, patient had 2 episode of whole body tremor for 5 minutes each, did not lose consciousness, resolved after laying in bed.  Episode concerning for related to low blood pressure.  Currently blood pressure improved, on the high side, nephrologist is managing the BP meds.  Today in clinic BP 148/97.  Continued on Keppra without side effects.  REVIEW OF SYSTEMS: Full 14 system review of systems performed and notable only for those listed below and in HPI above, all others are negative:  Constitutional:   Cardiovascular:  Ear/Nose/Throat:   Skin:  Eyes:   Respiratory: Cough, wheezing, choking Gastroitestinal:   Genitourinary:  Hematology/Lymphatic: Bruising easily, anemia Endocrine: Excessive eating Musculoskeletal:   Allergy/Immunology:   Neurological: Headache Psychiatric:  Sleep:   The following represents the patient's updated allergies and side effects list: Allergies  Allergen Reactions  . Lisinopril Cough    The neurologically relevant items on the patient's problem list were reviewed on today's visit.  Neurologic Examination  A problem focused neurological exam (12 or more points of the single system neurologic examination, vital signs counts as 1 point, cranial nerves count for 8 points) was performed.  Blood pressure (!) 148/87, pulse 73, height 5\' 5"  (1.651 m), weight 126 lb 9.6 oz (57.4 kg).  General - Well nourished, well developed, in no apparent distress.  Ophthalmologic - Fundi not visualized due to noncooperation.  Cardiovascular - Regular rate and rhythm with no murmur.  Mental Status -  Level of arousal and orientation to time, place, and person were intact. Language including expression, naming, repetition, comprehension was  assessed and found intact. Attention span and concentration were normal. Fund of Knowledge was assessed and was intact.  Cranial Nerves II - XII - II - Visual field intact OU. III, IV, VI -  Extraocular movements intact. V - Facial sensation intact bilaterally. VII - left nasolabial fold flattening. VIII - Hearing & vestibular intact bilaterally. X - Palate elevates symmetrically. XI - Chin turning & shoulder shrug intact bilaterally. XII - Tongue protrusion intact.  Motor Strength - The patient's strength was normal in RUE and RLE, however, LUE 3-/5 proximal and 2/5 distal. LLE 4+/5 proximal and 3/5 distal. Bulk was normal and fasciculations were absent.   Motor Tone - Muscle tone was assessed at the neck and appendages and was increased on the left  Reflexes - The patient's reflexes were 3+ in LUE and LLE and she had no pathological reflexes.  Sensory - Light touch, temperature/pinprick were assessed and were normal.    Coordination - The patient had normal movements in the right hand with no ataxia or dysmetria.  Tremor was absent.  Gait and Station - walk with cane, left hemiparetic gait, no left arm swing   Data reviewed: I personally reviewed the images and agree with the radiology interpretations.  Ct Head Wo Contrast 12/24/2016 1. Stable right frontal hemorrhage with evolving surrounding edema. 2. Stable 3-4 mm of midline shift.  12/22/2016 IMPRESSION: Stable intraparenchymal hemorrhage. Subtle signs of midline shift, and slight increased pressure. But no significant worsening from priors.    12/22/2016 IMPRESSION: 1. No significant interval change in posterior right frontal intraparenchymal hematoma with extra-axial extension. Similar localized edema and mass effect with 3 mm right-to-left shift. 2. No other new acute intracranial process.   12/21/2016 1. Continued increase in size of right frontal parenchymal hemorrhage with extra-axial extension. 2. Slight increase in associated mass effect.  12/20/2016 1. Slightly increased size of right frontal intraparenchymal hematoma with unchanged right convexity subarachnoid blood, compared to head CTA performed  12/19/2016. Compared to the initial head CT from this presentation, obtained at 5:02 p.m. on 12/19/2016, the hematoma has increased in size.  2. No new hemorrhage, hydrocephalus or mass effect.   12/19/2016 1. Right posterior frontal lobe acute hemorrhage measuring up to 29 mm with small area of surrounding edema and local mass effect. No significant midline shift or herniation. Follow-up to ensure resolution is recommended to exclude an underlying mass.  2. Small volume of subarachnoid hemorrhage overlying the right frontal convexity.  3. Hypoattenuation within subcortical white matter and bilateral parietal and occipital lobes. Findings are suggestive of PRES in the setting of hypertension. This can be further characterized with MRI of the brain.   Ct Angio Head W Or Wo Contrast Ct Angio Neck W And/or Wo Contrast 12/19/2016 1. No vascular explanation for the right frontal parietal hematoma. Dural venous sinuses are patent and there is no evidence of vascular malformation.  2. The 16 cc hematoma is mildly increased from earlier CT. Negative for shift or herniation.  3. Fibromuscular dysplasia of the bilateral cervical ICA.  4. Moderate atherosclerosis for age. Negative for flow limiting stenosis.    Mr Brain Wo Contrast 12/19/2016 1. Findings of posterior reversible encephalopathy syndrome.  2. Size stable posterior right frontal hematoma compared to CTA 1 hour prior.    EEG 12/21/2016 This awake and asleepEEG is abnormal due to diffuse slowing of the waking background.  LE venous doppler - no DVT  Component     Latest Ref Rng & Units 12/19/2016  12/20/2016 12/22/2016  HIV Screen 4th Generation wRfx     Non Reactive Non Reactive    RPR     Non Reactive  Non Reactive   Homocysteine     0.0 - 15.0 umol/L  21.0 (H)   Vitamin B12     180 - 914 pg/mL  1,095 (H)   TSH     0.350 - 4.500 uIU/mL  15.736 (H)   ALT     14 - 54 U/L   14  Ammonia     9 - 35 umol/L   38 (H)      Assessment: As you may recall, she is a 52 y.o. African American female with PMH of a renal cell carcinoma s/p right nephrectomy, hypertension, end-stage renal disease on peritoneal dialysis, hyperlipidemia, and anemia admitted on 12/19/16 for  right frontal lobe acute ICH. MRI head consistent with PRES. CTA unremarkable except FMD of bilateral cervical ICAs. Repeat head CT with stable hematoma. EF 60-65%. EEG diffuse slowing, no seizure activity, put on Keppra. BP eventually controlled with multiple PO meds. Attempted twice for HD, however, patient not able to tolerate, the second attempt even caused cardiac arrest. Switched back to PD, tolerating well. Discharge to CIR. During the interval time, left hemiplegia much improved. Currently, patient able to walk with cane. BP much improved, today 130/86, on Keppra, no seizures.  Able to drive now since no seizure for the last 6 months.  Follow-up with Dr. Danae Chen.  Plan:  - continue baby ASA 81mg  for stroke prevention.  - continue keppra for seizure precaution.  - able to drive now since no seizure for 6 months. Recommend to drive with family members on board for 2-3 times initially. If both parties feel comfortable of your driving, you can drive alone after. However, you are recommended to drive during the day not at night, no long distance and drive in familiar roads.  - Please maintain seizure precautions.  - check BP at home and record - continue PD at home and follow up with nephrology for BP management - continue aggressive PT/OT to increase strength. Follow up with rehab Dr. Letta Pate. - renal diet and self exercise - follow up in 6 months.   I spent more than 25 minutes of face to face time with the patient. Greater than 50% of time was spent in counseling and coordination of care. We discussed driving precautions, aggressive PT/OT, and follow up with nephrology.   No orders of the defined types were placed in this encounter.   No  orders of the defined types were placed in this encounter.   Patient Instructions  - continue baby ASA 81mg  for stroke prevention.  - continue keppra for seizure precaution.  - able to drive now since no seizure for 6 months. Recommend to drive with family members on board for 2-3 times initially. If both parties feel comfortable of your driving, you can drive alone after. However, you are recommended to drive during the day not at night, no long distance and drive in familiar roads.  - Please maintain seizure precautions.  - check BP at home and record - continue PD at home and follow up with nephrology for BP management - continue aggressive PT/OT to increase strength. Follow up with rehab Dr. Letta Pate. - renal diet and self exercise - follow up in 6 months.     Rosalin Hawking, MD PhD [facility] [address], [place], [place] [address] [phone]

## 2017-07-28 DIAGNOSIS — D631 Anemia in chronic kidney disease: Secondary | ICD-10-CM | POA: Insufficient documentation

## 2017-07-28 DIAGNOSIS — E878 Other disorders of electrolyte and fluid balance, not elsewhere classified: Secondary | ICD-10-CM | POA: Diagnosis not present

## 2017-07-28 DIAGNOSIS — N186 End stage renal disease: Secondary | ICD-10-CM | POA: Diagnosis not present

## 2017-07-28 DIAGNOSIS — Z992 Dependence on renal dialysis: Secondary | ICD-10-CM

## 2017-07-28 DIAGNOSIS — R17 Unspecified jaundice: Secondary | ICD-10-CM | POA: Diagnosis not present

## 2017-07-28 DIAGNOSIS — D509 Iron deficiency anemia, unspecified: Secondary | ICD-10-CM | POA: Diagnosis not present

## 2017-07-29 DIAGNOSIS — D631 Anemia in chronic kidney disease: Secondary | ICD-10-CM | POA: Diagnosis not present

## 2017-07-29 DIAGNOSIS — D509 Iron deficiency anemia, unspecified: Secondary | ICD-10-CM | POA: Diagnosis not present

## 2017-07-29 DIAGNOSIS — R17 Unspecified jaundice: Secondary | ICD-10-CM | POA: Diagnosis not present

## 2017-07-29 DIAGNOSIS — N186 End stage renal disease: Secondary | ICD-10-CM | POA: Diagnosis not present

## 2017-07-29 DIAGNOSIS — E878 Other disorders of electrolyte and fluid balance, not elsewhere classified: Secondary | ICD-10-CM | POA: Diagnosis not present

## 2017-07-30 DIAGNOSIS — E878 Other disorders of electrolyte and fluid balance, not elsewhere classified: Secondary | ICD-10-CM | POA: Diagnosis not present

## 2017-07-30 DIAGNOSIS — D509 Iron deficiency anemia, unspecified: Secondary | ICD-10-CM | POA: Diagnosis not present

## 2017-07-30 DIAGNOSIS — R17 Unspecified jaundice: Secondary | ICD-10-CM | POA: Diagnosis not present

## 2017-07-30 DIAGNOSIS — D631 Anemia in chronic kidney disease: Secondary | ICD-10-CM | POA: Diagnosis not present

## 2017-07-30 DIAGNOSIS — N186 End stage renal disease: Secondary | ICD-10-CM | POA: Diagnosis not present

## 2017-07-31 DIAGNOSIS — D509 Iron deficiency anemia, unspecified: Secondary | ICD-10-CM | POA: Diagnosis not present

## 2017-07-31 DIAGNOSIS — N186 End stage renal disease: Secondary | ICD-10-CM | POA: Diagnosis not present

## 2017-07-31 DIAGNOSIS — D631 Anemia in chronic kidney disease: Secondary | ICD-10-CM | POA: Diagnosis not present

## 2017-07-31 DIAGNOSIS — E878 Other disorders of electrolyte and fluid balance, not elsewhere classified: Secondary | ICD-10-CM | POA: Diagnosis not present

## 2017-07-31 DIAGNOSIS — R17 Unspecified jaundice: Secondary | ICD-10-CM | POA: Diagnosis not present

## 2017-08-01 DIAGNOSIS — N186 End stage renal disease: Secondary | ICD-10-CM | POA: Diagnosis not present

## 2017-08-01 DIAGNOSIS — R17 Unspecified jaundice: Secondary | ICD-10-CM | POA: Diagnosis not present

## 2017-08-01 DIAGNOSIS — D631 Anemia in chronic kidney disease: Secondary | ICD-10-CM | POA: Diagnosis not present

## 2017-08-01 DIAGNOSIS — E878 Other disorders of electrolyte and fluid balance, not elsewhere classified: Secondary | ICD-10-CM | POA: Diagnosis not present

## 2017-08-01 DIAGNOSIS — D509 Iron deficiency anemia, unspecified: Secondary | ICD-10-CM | POA: Diagnosis not present

## 2017-08-02 ENCOUNTER — Emergency Department (HOSPITAL_COMMUNITY): Payer: Medicare Other

## 2017-08-02 ENCOUNTER — Encounter (HOSPITAL_COMMUNITY): Payer: Self-pay

## 2017-08-02 ENCOUNTER — Other Ambulatory Visit: Payer: Self-pay

## 2017-08-02 DIAGNOSIS — E878 Other disorders of electrolyte and fluid balance, not elsewhere classified: Secondary | ICD-10-CM | POA: Diagnosis not present

## 2017-08-02 DIAGNOSIS — S301XXA Contusion of abdominal wall, initial encounter: Secondary | ICD-10-CM | POA: Diagnosis not present

## 2017-08-02 DIAGNOSIS — M79642 Pain in left hand: Secondary | ICD-10-CM | POA: Insufficient documentation

## 2017-08-02 DIAGNOSIS — M25512 Pain in left shoulder: Secondary | ICD-10-CM | POA: Insufficient documentation

## 2017-08-02 DIAGNOSIS — I120 Hypertensive chronic kidney disease with stage 5 chronic kidney disease or end stage renal disease: Secondary | ICD-10-CM | POA: Insufficient documentation

## 2017-08-02 DIAGNOSIS — Z992 Dependence on renal dialysis: Secondary | ICD-10-CM | POA: Diagnosis not present

## 2017-08-02 DIAGNOSIS — I69354 Hemiplegia and hemiparesis following cerebral infarction affecting left non-dominant side: Secondary | ICD-10-CM | POA: Diagnosis not present

## 2017-08-02 DIAGNOSIS — Z79899 Other long term (current) drug therapy: Secondary | ICD-10-CM | POA: Diagnosis not present

## 2017-08-02 DIAGNOSIS — Z7982 Long term (current) use of aspirin: Secondary | ICD-10-CM | POA: Diagnosis not present

## 2017-08-02 DIAGNOSIS — E039 Hypothyroidism, unspecified: Secondary | ICD-10-CM | POA: Insufficient documentation

## 2017-08-02 DIAGNOSIS — S4992XA Unspecified injury of left shoulder and upper arm, initial encounter: Secondary | ICD-10-CM | POA: Diagnosis not present

## 2017-08-02 DIAGNOSIS — Y929 Unspecified place or not applicable: Secondary | ICD-10-CM | POA: Diagnosis not present

## 2017-08-02 DIAGNOSIS — R109 Unspecified abdominal pain: Secondary | ICD-10-CM | POA: Diagnosis not present

## 2017-08-02 DIAGNOSIS — R17 Unspecified jaundice: Secondary | ICD-10-CM | POA: Diagnosis not present

## 2017-08-02 DIAGNOSIS — N186 End stage renal disease: Secondary | ICD-10-CM | POA: Diagnosis not present

## 2017-08-02 DIAGNOSIS — S79912A Unspecified injury of left hip, initial encounter: Secondary | ICD-10-CM | POA: Diagnosis not present

## 2017-08-02 DIAGNOSIS — W1839XA Other fall on same level, initial encounter: Secondary | ICD-10-CM | POA: Diagnosis not present

## 2017-08-02 DIAGNOSIS — Y939 Activity, unspecified: Secondary | ICD-10-CM | POA: Diagnosis not present

## 2017-08-02 DIAGNOSIS — S3981XA Other specified injuries of abdomen, initial encounter: Secondary | ICD-10-CM | POA: Diagnosis present

## 2017-08-02 DIAGNOSIS — Y999 Unspecified external cause status: Secondary | ICD-10-CM | POA: Diagnosis not present

## 2017-08-02 DIAGNOSIS — D509 Iron deficiency anemia, unspecified: Secondary | ICD-10-CM | POA: Diagnosis not present

## 2017-08-02 DIAGNOSIS — M25552 Pain in left hip: Secondary | ICD-10-CM | POA: Diagnosis not present

## 2017-08-02 DIAGNOSIS — R1110 Vomiting, unspecified: Secondary | ICD-10-CM | POA: Diagnosis not present

## 2017-08-02 DIAGNOSIS — M7989 Other specified soft tissue disorders: Secondary | ICD-10-CM | POA: Diagnosis not present

## 2017-08-02 DIAGNOSIS — D631 Anemia in chronic kidney disease: Secondary | ICD-10-CM | POA: Diagnosis not present

## 2017-08-02 LAB — BASIC METABOLIC PANEL
Anion gap: 21 — ABNORMAL HIGH (ref 5–15)
BUN: 67 mg/dL — ABNORMAL HIGH (ref 6–20)
CO2: 21 mmol/L — ABNORMAL LOW (ref 22–32)
Calcium: 9.5 mg/dL (ref 8.9–10.3)
Chloride: 100 mmol/L — ABNORMAL LOW (ref 101–111)
Creatinine, Ser: 14.64 mg/dL — ABNORMAL HIGH (ref 0.44–1.00)
GFR calc Af Amer: 3 mL/min — ABNORMAL LOW (ref >60)
GFR calc non Af Amer: 2 mL/min — ABNORMAL LOW (ref >60)
Glucose, Bld: 88 mg/dL (ref 65–99)
Potassium: 4.8 mmol/L (ref 3.5–5.1)
Sodium: 142 mmol/L (ref 135–145)

## 2017-08-02 LAB — CBC WITH DIFFERENTIAL/PLATELET
Basophils Absolute: 0 10*3/uL (ref 0.0–0.1)
Basophils Relative: 0 %
Eosinophils Absolute: 0.3 10*3/uL (ref 0.0–0.7)
Eosinophils Relative: 3 %
HCT: 41.2 % (ref 36.0–46.0)
Hemoglobin: 12.2 g/dL (ref 12.0–15.0)
Lymphocytes Relative: 21 %
Lymphs Abs: 1.9 10*3/uL (ref 0.7–4.0)
MCH: 33.1 pg (ref 26.0–34.0)
MCHC: 29.6 g/dL — ABNORMAL LOW (ref 30.0–36.0)
MCV: 111.7 fL — ABNORMAL HIGH (ref 78.0–100.0)
Monocytes Absolute: 0.9 10*3/uL (ref 0.1–1.0)
Monocytes Relative: 10 %
Neutro Abs: 6 10*3/uL (ref 1.7–7.7)
Neutrophils Relative %: 66 %
Platelets: 352 10*3/uL (ref 150–400)
RBC: 3.69 MIL/uL — ABNORMAL LOW (ref 3.87–5.11)
RDW: 16.4 % — ABNORMAL HIGH (ref 11.5–15.5)
WBC: 9.1 10*3/uL (ref 4.0–10.5)

## 2017-08-02 NOTE — ED Provider Notes (Signed)
Patient placed in Quick Look pathway, seen and evaluated   Chief Complaint: fall  HPI:   52 year old female presents status post fall.  Patient history of a stroke with left-sided weakness.  She notes she uses a cane for ambulation.  She had a mechanical fall with pain in her left lateral hip, pain over the dorsum of the left hand and minor pain in the left shoulder.  Patient notes that she has bruising around the left hip and flank with tenderness to palpation around that region.  Patient also reports she has been slightly nauseous since the fall.  No head trauma.  ROS: Hip pain (one)  Physical Exam:   Gen: No distress  Neuro: Awake and Alert  Skin: Warm    Focused Exam: Tenderness palpation of left lateral hip with bruising noted at the ischium and flank-abdominal exam without significant abdominal tenderness-left shoulder with minimal tenderness palpation-left hand minor edema over the dorsum   Initiation of care has begun. The patient has been counseled on the process, plan, and necessity for staying for the completion/evaluation, and the remainder of the medical screening examination    Francee Gentile 08/02/17 [place], MD 08/02/17 2119

## 2017-08-02 NOTE — ED Notes (Signed)
Family member up to inquire about wait time. Apologized for extended wait time. Unable to give approximate wait due to high number of ADMITS Holding in ED. Encouraged patient to wait a bit longer.

## 2017-08-02 NOTE — ED Triage Notes (Addendum)
Pt states she had a fall 2 days ago. She states she had a stroke and now has left side weakness which caused her fall. No LOC. Pt reports left side flank pain, bruising and a knot noted. Pt does not take blood thinners.

## 2017-08-03 ENCOUNTER — Emergency Department (HOSPITAL_COMMUNITY)
Admission: EM | Admit: 2017-08-03 | Discharge: 2017-08-03 | Disposition: A | Discharge status: Home / Self Care | Payer: Medicare Other | Attending: Emergency Medicine | Admitting: Emergency Medicine

## 2017-08-03 ENCOUNTER — Other Ambulatory Visit: Payer: Self-pay

## 2017-08-03 DIAGNOSIS — N186 End stage renal disease: Secondary | ICD-10-CM | POA: Diagnosis not present

## 2017-08-03 DIAGNOSIS — D509 Iron deficiency anemia, unspecified: Secondary | ICD-10-CM | POA: Diagnosis not present

## 2017-08-03 DIAGNOSIS — S301XXA Contusion of abdominal wall, initial encounter: Secondary | ICD-10-CM

## 2017-08-03 DIAGNOSIS — E878 Other disorders of electrolyte and fluid balance, not elsewhere classified: Secondary | ICD-10-CM | POA: Diagnosis not present

## 2017-08-03 DIAGNOSIS — R17 Unspecified jaundice: Secondary | ICD-10-CM | POA: Diagnosis not present

## 2017-08-03 DIAGNOSIS — D631 Anemia in chronic kidney disease: Secondary | ICD-10-CM | POA: Diagnosis not present

## 2017-08-03 DIAGNOSIS — W19XXXA Unspecified fall, initial encounter: Secondary | ICD-10-CM

## 2017-08-03 NOTE — ED Provider Notes (Signed)
[facility] EMERGENCY DEPARTMENT Provider Note   CSN: [id number] Arrival date & time: 08/02/17  1525     History   Chief Complaint Chief Complaint  Patient presents with  . Fall    HPI Terry Juarez is a 52 y.o. female.  Patient with history of ESRD-PD, HTN, HLD presents 2 days post-fall for evaluation of left side pain. She stood on waking in the morning 2 days ago and fell onto the left side, she feels, due to residual left sided weakness after past CVA. She denies having hit her head, LOC, chest pain, trouble breathing or back pain. She has been ambulatory since the fall per her usual but reports discomfort to the abdominal side when she stands. She dialyzes nightly on peritoneal dialysis. She denies seeing any blood in her peritoneal fluids. She reports some nausea with vomiting x 2 since the fall. No change in PO intake. She is not anticoagulated. She urinates infrequently and reports no gross hematuria since the fall.   The history is provided by the patient. No language interpreter was used.  Fall  Associated symptoms include abdominal pain (See HPI.). Pertinent negatives include no chest pain and no shortness of breath.    Past Medical History:  Diagnosis Date  . Anemia   . Bruises easily   . Dialysis patient ([place])   . ESRD on dialysis ([place])   . Hyperlipidemia   . Hypertension   . Renal disorder    rt renal mass / < functioning of left kidney - being prepared for possible dialysis  . Right renal mass   . Stroke [facility])     Patient Active Problem List   Diagnosis Date Noted  . Anemia due to chronic kidney disease, on chronic dialysis ([place]) 07/28/2017  . Hypertension secondary to other renal disorders 04/08/2017  . Left spastic hemiparesis ([place])   . Fall   . Slow transit constipation 01/28/2017  . Flaccid hemiplegia of left nondominant side as late effect of nontraumatic intraparenchymal hemorrhage of brain ([place])   . ESRD on dialysis ([place])   .  Leukocytosis   . Hyperglycemia   . Hypertensive emergency 01/04/2017  . Acute left hemiparesis ([place]) 01/04/2017  . Intracranial hemorrhage ([place]) 01/04/2017  . Subarachnoid hematoma ([place])   . Hypothyroidism   . Benign essential HTN   . Seizure prophylaxis   . Cardiac arrest, cause unspecified ([place])   . Acute respiratory failure with hypoxemia ([place])   . Acute encephalopathy   . ICH (intracerebral hemorrhage) ([place])   . Seizure ([place])   . PRES (posterior reversible encephalopathy syndrome)   . Subarachnoid hemorrhage 12/19/2016  . Symptomatic anemia 07/29/2016  . Cough 01/09/2016  . Elevated troponin 02/12/2015  . Headache 02/12/2015  . Syncope 02/12/2015  . ESRD on peritoneal dialysis ([place]) 02/12/2015  . Hypertension   . Hyperlipidemia   . Anemia   . Essential hypertension   . Cephalalgia   . Right renal mass 07/25/2014    Past Surgical History:  Procedure Laterality Date  . AV FISTULA PLACEMENT Right 07/11/2014   Procedure: ARTERIOVENOUS (AV) FISTULA CREATION RIGHT ARM BRACHIO-CEPHALIC WITH ATTEMPTED RADIO-CEPHALIC (AV) FISTULA;  Surgeon: Mal Misty, MD;  Location: [facility];  Service: Vascular;  Laterality: Right;  . COLONOSCOPY WITH PROPOFOL N/A 09/04/2016   Procedure: COLONOSCOPY WITH PROPOFOL;  Surgeon: Carol Ada, MD;  [facility];  Service: Endoscopy;  Laterality: N/A;  . ECTOPIC PREGNANCY SURGERY  1987  . ESOPHAGOGASTRODUODENOSCOPY N/A 07/30/2016  Procedure: ESOPHAGOGASTRODUODENOSCOPY (EGD);  Surgeon: Carol Ada, MD;  Location: [facility] ENDOSCOPY;  Service: Endoscopy;  Laterality: N/A;  Bedside  . ESOPHAGOGASTRODUODENOSCOPY (EGD) WITH PROPOFOL N/A 09/04/2016   Procedure: ESOPHAGOGASTRODUODENOSCOPY (EGD) WITH PROPOFOL;  Surgeon: Carol Ada, MD;  [facility];  Service: Endoscopy;  Laterality: N/A;  . INSERTION OF DIALYSIS CATHETER Right 07/11/2014   Procedure: INSERTION OF DIALYSIS CATHETER IN RIGHT INTERNAL JUGULAR ;  Surgeon: Mal Misty, MD;  Location:  [facility];  Service: Vascular;  Laterality: Right;  . LAPAROSCOPIC NEPHRECTOMY Right 07/25/2014   Procedure: RIGHT LAPAROSCOPIC RADICAL NEPHRECTOMY ;  Surgeon: Ardis Hughs, MD;  [facility];  Service: Urology;  Laterality: Right;  . PATCH ANGIOPLASTY Right 07/11/2014   Procedure: PATCH ANGIOPLASTY OF RIGHT RADIAL ARTERY USING CEPHALIC VEIN.;  Surgeon: Mal Misty, MD;  Location: [facility];  Service: Vascular;  Laterality: Right;  . THROMBECTOMY W/ EMBOLECTOMY Right 07/11/2014   Procedure: THROMBECTOMY OF RIGHT RADIAL ARTERY  ;  Surgeon: Mal Misty, MD;  Location: [facility];  Service: Vascular;  Laterality: Right;    OB History    No data available       Home Medications    Prior to Admission medications   Medication Sig Start Date End Date Taking? Authorizing Provider  acetaminophen (TYLENOL) 500 MG tablet Take 1,000 mg by mouth every 6 (six) hours as needed for moderate pain or headache.   Yes [provider]  amLODipine (NORVASC) 10 MG tablet Take 1 tablet (10 mg total) by mouth daily. Patient taking differently: Take 10 mg by mouth at bedtime.  02/01/17  Yes Love, Ivan Anchors, PA-C  aspirin EC 81 MG tablet Take 1 tablet (81 mg total) by mouth daily. Patient taking differently: Take 81 mg by mouth at bedtime.  04/08/17  Yes Rosalin Hawking, MD  calcium acetate (PHOSLO) 667 MG capsule Take 2 capsules (1,334 mg total) by mouth 3 (three) times daily with meals. Patient taking differently: Take 1,334 mg by mouth See admin instructions. Take 2 capsules (1334 mg) by mouth up to four times daily - with meals and snacks 02/01/17  Yes Love, Pamela S, PA-C  carvedilol (COREG) 12.5 MG tablet Take 1 tablet (12.5 mg total) by mouth 2 (two) times daily. Patient taking differently: Take 25 mg by mouth 2 (two) times daily.  02/01/17  Yes Love, Ivan Anchors, PA-C  cinacalcet (SENSIPAR) 60 MG tablet Take 60 mg by mouth at bedtime.   Yes [provider]  gentamicin cream (GARAMYCIN) 0.1 % Apply 1  application topically daily. Patient taking differently: Apply 1 application topically See admin instructions. Apply to dialysis site after every shower 02/01/17  Yes Love, Ivan Anchors, PA-C  levETIRAcetam (KEPPRA) 500 MG tablet Take 1 tablet (500 mg total) by mouth 2 (two) times daily. 02/01/17  Yes Love, Ivan Anchors, PA-C  levothyroxine (SYNTHROID, LEVOTHROID) 88 MCG tablet Take 1 tablet (88 mcg total) by mouth daily before breakfast. 02/01/17  Yes Love, Ivan Anchors, PA-C  multivitamin (RENA-VIT) TABS tablet Take 1 tablet by mouth daily.   Yes [provider]  famotidine (PEPCID) 20 MG tablet Take 1 tablet (20 mg total) by mouth daily. Patient not taking: Reported on 08/02/2017 02/01/17   Love, Ivan Anchors, PA-C  FLUoxetine (PROZAC) 20 MG capsule Take 1 capsule (20 mg total) by mouth daily. Patient not taking: Reported on 08/02/2017 02/01/17   Love, Ivan Anchors, PA-C  pantoprazole (PROTONIX) 40 MG tablet Take 1 tablet (40 mg total) by mouth  2 (two) times daily. Patient not taking: Reported on 08/02/2017 02/01/17   Love, Ivan Anchors, PA-C  senna-docusate (SENOKOT-S) 8.6-50 MG tablet Take 3 tablets by mouth 2 (two) times daily. Patient not taking: Reported on 08/02/2017 02/01/17   Love, Ivan Anchors, PA-C    Family History Family History  Problem Relation Age of Onset  . Throat cancer Mother        smoked  . Hypertension Father     Social History Social History   Tobacco Use  . Smoking status: Never Smoker  . Smokeless tobacco: Never Used  Substance Use Topics  . Alcohol use: Yes    Alcohol/week: 1.2 oz    Types: 2 Glasses of wine per week    Comment: occ  . Drug use: No     Allergies   Lisinopril   Review of Systems Review of Systems  Constitutional: Negative for chills and fever.  Respiratory: Negative.  Negative for shortness of breath.   Cardiovascular: Negative.  Negative for chest pain.  Gastrointestinal: Positive for abdominal pain (See HPI.) and vomiting.  Genitourinary: Negative for  hematuria.  Musculoskeletal: Negative.   Skin: Positive for color change.  Neurological: Negative.      Physical Exam Updated Vital Signs BP (!) 165/104 (BP Location: Left Arm)   Pulse 73   Temp 98.9 F (37.2 C) (Oral)   Resp 18   SpO2 100%   Physical Exam  Constitutional: She is oriented to person, place, and time. She appears well-developed and well-nourished.  HENT:  Head: Normocephalic.  Neck: Normal range of motion. Neck supple.  Cardiovascular: Normal rate, regular rhythm and intact distal pulses.  Pulmonary/Chest: Effort normal and breath sounds normal. She has no wheezes. She has no rales.  Abdominal: Soft. There is tenderness. There is no rebound and no guarding.    Tenderness focal to hematoma in left lateral abdomen. No referred abdominal tenderness.   Musculoskeletal: Normal range of motion.  Left UE paralysis, chronic. No bony deformity. FROM LE extremity without hip pain.   Neurological: She is alert and oriented to person, place, and time.  Skin: Skin is warm and dry.  Hematoma to left lateral abdomen.  Psychiatric: She has a normal mood and affect.     ED Treatments / Results  Labs (all labs ordered are listed, but only abnormal results are displayed) Labs Reviewed  CBC WITH DIFFERENTIAL/PLATELET - Abnormal; Notable for the following components:      Result Value   RBC 3.69 (*)    MCV 111.7 (*)    MCHC 29.6 (*)    RDW 16.4 (*)    All other components within normal limits  BASIC METABOLIC PANEL - Abnormal; Notable for the following components:   Chloride 100 (*)    CO2 21 (*)    BUN 67 (*)    Creatinine, Ser 14.64 (*)    GFR calc non Af Amer 2 (*)    GFR calc Af Amer 3 (*)    Anion gap 21 (*)    All other components within normal limits    EKG  EKG Interpretation None       Radiology Dg Shoulder Left  Result Date: 08/02/2017 CLINICAL DATA:  Acute left shoulder pain following fall 3 days ago. Initial encounter. EXAM: LEFT SHOULDER - 2+  VIEW COMPARISON:  None. FINDINGS: There is no evidence of fracture or dislocation. There is no evidence of arthropathy or other focal bone abnormality. Soft tissues are unremarkable. IMPRESSION: Negative. Electronically Signed  By: Margarette Canada M.D.   On: 08/02/2017 19:37   Dg Hand Complete Left  Result Date: 08/02/2017 CLINICAL DATA:  Acute left hand pain following fall 3 days ago. Initial encounter. EXAM: LEFT HAND - COMPLETE 3+ VIEW COMPARISON:  None. FINDINGS: There is no evidence of acute fracture, subluxation or dislocation. Mild soft tissue swelling is noted. No focal bony lesions are present. IMPRESSION: Mild soft tissue swelling without acute bony abnormality. Electronically Signed   By: Margarette Canada M.D.   On: 08/02/2017 19:39   Dg Hip Unilat W Or Wo Pelvis 2-3 Views Left  Result Date: 08/02/2017 CLINICAL DATA:  Fall, left hip pain EXAM: DG HIP (WITH OR WITHOUT PELVIS) 2-3V LEFT COMPARISON:  None. FINDINGS: Soft tissue swelling laterally overlying the left hip. No underlying bony abnormality. No fracture, subluxation or dislocation. Peritoneal dialysis catheter projects in the lower pelvis. SI joints are symmetric and unremarkable. IMPRESSION: No acute bony abnormality. Electronically Signed   By: Rolm Baptise M.D.   On: 08/02/2017 19:37    Procedures Procedures (including critical care time)  Medications Ordered in ED Medications - No data to display   Initial Impression / Assessment and Plan / ED Course  I have reviewed the triage vital signs and the nursing notes.  Pertinent labs & imaging results that were available during my care of the patient were reviewed by me and considered in my medical decision making (see chart for details).     Patient presents 2 days post-fall for evaluation of pain in left lateral abdomen at the site of bruising from the fall. Vomiting x 2 since.   Tenderness of abdomen is focal to site of hematoma. No pain with breathing - normal breath sound. No  referred abdominal tenderness. VSS. Imaging of hand, hip and shoulder are negative. Doubt rib injury. Not anticoagulated.   Discussed with Dr. Leonides Schanz. Feel she is appropriate for discharge home. Discussed return precautions of increased abdominal pain, fever, SOB/painful respirations. Patient is comfortable with discharge home.   Final Clinical Impressions(s) / ED Diagnoses   Final diagnoses:  None   1. Fall 2. Abdominal hematoma 3. S/p CVA  ED Discharge Orders    None       Charlann Lange, Hershal Coria 08/03/17 0119    Charlann Lange, PA-C 08/03/17 0125    Ward, Delice Bison, DO 08/03/17 [phone]

## 2017-08-03 NOTE — Discharge Instructions (Signed)
Return to the emergency department with any increasing abdominal pain, fever or new concern.

## 2017-08-04 ENCOUNTER — Other Ambulatory Visit: Payer: Self-pay | Admitting: Gastroenterology

## 2017-08-04 DIAGNOSIS — N186 End stage renal disease: Secondary | ICD-10-CM | POA: Diagnosis not present

## 2017-08-04 DIAGNOSIS — D631 Anemia in chronic kidney disease: Secondary | ICD-10-CM | POA: Diagnosis not present

## 2017-08-04 DIAGNOSIS — E878 Other disorders of electrolyte and fluid balance, not elsewhere classified: Secondary | ICD-10-CM | POA: Diagnosis not present

## 2017-08-04 DIAGNOSIS — I6789 Other cerebrovascular disease: Secondary | ICD-10-CM | POA: Diagnosis not present

## 2017-08-04 DIAGNOSIS — R17 Unspecified jaundice: Secondary | ICD-10-CM | POA: Diagnosis not present

## 2017-08-04 DIAGNOSIS — D509 Iron deficiency anemia, unspecified: Secondary | ICD-10-CM | POA: Diagnosis not present

## 2017-08-04 DIAGNOSIS — K254 Chronic or unspecified gastric ulcer with hemorrhage: Secondary | ICD-10-CM | POA: Diagnosis not present

## 2017-08-05 DIAGNOSIS — N186 End stage renal disease: Secondary | ICD-10-CM | POA: Diagnosis not present

## 2017-08-05 DIAGNOSIS — E878 Other disorders of electrolyte and fluid balance, not elsewhere classified: Secondary | ICD-10-CM | POA: Diagnosis not present

## 2017-08-05 DIAGNOSIS — D631 Anemia in chronic kidney disease: Secondary | ICD-10-CM | POA: Diagnosis not present

## 2017-08-05 DIAGNOSIS — D509 Iron deficiency anemia, unspecified: Secondary | ICD-10-CM | POA: Diagnosis not present

## 2017-08-05 DIAGNOSIS — R17 Unspecified jaundice: Secondary | ICD-10-CM | POA: Diagnosis not present

## 2017-08-06 ENCOUNTER — Ambulatory Visit (HOSPITAL_BASED_OUTPATIENT_CLINIC_OR_DEPARTMENT_OTHER): Payer: Medicare Other | Admitting: Physical Medicine & Rehabilitation

## 2017-08-06 ENCOUNTER — Encounter: Payer: Self-pay | Admitting: Physical Medicine & Rehabilitation

## 2017-08-06 ENCOUNTER — Encounter: Payer: Medicare Other | Attending: Physical Medicine & Rehabilitation

## 2017-08-06 ENCOUNTER — Other Ambulatory Visit: Payer: Self-pay

## 2017-08-06 VITALS — BP 159/101 | HR 73

## 2017-08-06 DIAGNOSIS — N2581 Secondary hyperparathyroidism of renal origin: Secondary | ICD-10-CM | POA: Diagnosis not present

## 2017-08-06 DIAGNOSIS — E440 Moderate protein-calorie malnutrition: Secondary | ICD-10-CM | POA: Diagnosis not present

## 2017-08-06 DIAGNOSIS — I120 Hypertensive chronic kidney disease with stage 5 chronic kidney disease or end stage renal disease: Secondary | ICD-10-CM | POA: Diagnosis not present

## 2017-08-06 DIAGNOSIS — Z5189 Encounter for other specified aftercare: Secondary | ICD-10-CM | POA: Diagnosis not present

## 2017-08-06 DIAGNOSIS — D631 Anemia in chronic kidney disease: Secondary | ICD-10-CM | POA: Diagnosis not present

## 2017-08-06 DIAGNOSIS — Z79899 Other long term (current) drug therapy: Secondary | ICD-10-CM | POA: Diagnosis not present

## 2017-08-06 DIAGNOSIS — I69354 Hemiplegia and hemiparesis following cerebral infarction affecting left non-dominant side: Secondary | ICD-10-CM | POA: Insufficient documentation

## 2017-08-06 DIAGNOSIS — E785 Hyperlipidemia, unspecified: Secondary | ICD-10-CM | POA: Diagnosis not present

## 2017-08-06 DIAGNOSIS — E890 Postprocedural hypothyroidism: Secondary | ICD-10-CM | POA: Diagnosis not present

## 2017-08-06 DIAGNOSIS — Z992 Dependence on renal dialysis: Secondary | ICD-10-CM | POA: Diagnosis not present

## 2017-08-06 DIAGNOSIS — G8114 Spastic hemiplegia affecting left nondominant side: Secondary | ICD-10-CM | POA: Diagnosis not present

## 2017-08-06 DIAGNOSIS — R17 Unspecified jaundice: Secondary | ICD-10-CM | POA: Diagnosis not present

## 2017-08-06 DIAGNOSIS — N186 End stage renal disease: Secondary | ICD-10-CM | POA: Insufficient documentation

## 2017-08-06 DIAGNOSIS — D509 Iron deficiency anemia, unspecified: Secondary | ICD-10-CM | POA: Diagnosis not present

## 2017-08-06 DIAGNOSIS — R82998 Other abnormal findings in urine: Secondary | ICD-10-CM | POA: Diagnosis not present

## 2017-08-06 DIAGNOSIS — N041 Nephrotic syndrome with focal and segmental glomerular lesions: Secondary | ICD-10-CM | POA: Diagnosis not present

## 2017-08-06 NOTE — Patient Instructions (Addendum)
ISOTONER GLOVE FOR LEFT HAND  PLEASE WEAR LEFT AFO (BRACE) AGAIN  POSSIBLE BOTOX NEXT VISIT IF HAND IS TIGHTER

## 2017-08-06 NOTE — Progress Notes (Signed)
Subjective:    Patient ID: Terry Juarez, female    DOB: Oct 03, 1965, 52 y.o.   MRN: [id number] DATE OF ADMISSION:  01/04/2017 DATE OF DISCHARGE:  02/01/2017 52 year old female with history of right frontal hemorrhage onset 12/19/2016  HPI No further seizures since onset of stroke.  Evaluated by neurology 07/27/2017.  Clear to drive initially with family supervision, she has driven with her brother about 3 times and now feels comfortable driving herself.  She is modified independent with all self-care and mobility but does require a quad cane held in the right hand.  We discussed the referral to outpatient therapy which was placed on hold because of transportation issues, she is now driving independently  Patient's tone in the left upper limb is slowly increasing.  She notes that she is dragging her left foot little bit more  Prior Botox injection 05/04/2017 FCR50 FCU25 FDS50 FDP50 FPL25  Pain Inventory Average Pain 0 Pain Right Now 0 My pain is no pain  In the last 24 hours, has pain interfered with the following? General activity 0 Relation with others 0 Enjoyment of life no pain What TIME of day is your pain at its worst? no pain Sleep (in general) Fair  Pain is worse with: no pain Pain improves with: no pain Relief from Meds: no pain  Mobility use a cane  Function not employed: date last employed 6.15/18 disabled: date disabled . I need assistance with the following:  household duties  Neuro/Psych spasms  Prior Studies Any changes since last visit?  yes  [place] and went to ED 08/03/17  Physicians involved in your care Any changes since last visit?  no   Family History  Problem Relation Age of Onset  . Throat cancer Mother        smoked  . Hypertension Father    Social History   Socioeconomic History  . Marital status: Widowed    Spouse name: None  . Number of children: None  . Years of education: None  . Highest education level: None  Social  Needs  . Financial resource strain: None  . Food insecurity - worry: None  . Food insecurity - inability: None  . Transportation needs - medical: None  . Transportation needs - non-medical: None  Occupational History  . None  Tobacco Use  . Smoking status: Never Smoker  . Smokeless tobacco: Never Used  Substance and Sexual Activity  . Alcohol use: Yes    Alcohol/week: 1.2 oz    Types: 2 Glasses of wine per week    Comment: occ  . Drug use: No  . Sexual activity: Yes  Other Topics Concern  . None  Social History Narrative  . None   Past Surgical History:  Procedure Laterality Date  . AV FISTULA PLACEMENT Right 07/11/2014   Procedure: ARTERIOVENOUS (AV) FISTULA CREATION RIGHT ARM BRACHIO-CEPHALIC WITH ATTEMPTED RADIO-CEPHALIC (AV) FISTULA;  Surgeon: Mal Misty, MD;  Location: [facility];  Service: Vascular;  Laterality: Right;  . COLONOSCOPY WITH PROPOFOL N/A 09/04/2016   Procedure: COLONOSCOPY WITH PROPOFOL;  Surgeon: Carol Ada, MD;  [facility];  Service: Endoscopy;  Laterality: N/A;  . ECTOPIC PREGNANCY SURGERY  1987  . ESOPHAGOGASTRODUODENOSCOPY N/A 07/30/2016   Procedure: ESOPHAGOGASTRODUODENOSCOPY (EGD);  Surgeon: Carol Ada, MD;  Location: [facility] ENDOSCOPY;  Service: Endoscopy;  Laterality: N/A;  Bedside  . ESOPHAGOGASTRODUODENOSCOPY (EGD) WITH PROPOFOL N/A 09/04/2016   Procedure: ESOPHAGOGASTRODUODENOSCOPY (EGD) WITH PROPOFOL;  Surgeon: Carol Ada, MD;  [facility];  Service: Endoscopy;  Laterality: N/A;  . INSERTION OF DIALYSIS CATHETER Right 07/11/2014   Procedure: INSERTION OF DIALYSIS CATHETER IN RIGHT INTERNAL JUGULAR ;  Surgeon: Mal Misty, MD;  Location: [facility];  Service: Vascular;  Laterality: Right;  . LAPAROSCOPIC NEPHRECTOMY Right 07/25/2014   Procedure: RIGHT LAPAROSCOPIC RADICAL NEPHRECTOMY ;  Surgeon: Ardis Hughs, MD;  [facility];  Service: Urology;  Laterality: Right;  . PATCH ANGIOPLASTY Right 07/11/2014   Procedure: PATCH  ANGIOPLASTY OF RIGHT RADIAL ARTERY USING CEPHALIC VEIN.;  Surgeon: Mal Misty, MD;  Location: [facility];  Service: Vascular;  Laterality: Right;  . THROMBECTOMY W/ EMBOLECTOMY Right 07/11/2014   Procedure: THROMBECTOMY OF RIGHT RADIAL ARTERY  ;  Surgeon: Mal Misty, MD;  Location: [facility];  Service: Vascular;  Laterality: Right;   Past Medical History:  Diagnosis Date  . Anemia   . Bruises easily   . Dialysis patient ([place])   . ESRD on dialysis ([place])   . Hyperlipidemia   . Hypertension   . Renal disorder    rt renal mass / < functioning of left kidney - being prepared for possible dialysis  . Right renal mass   . Stroke ([place])    BP (!) 159/101 Comment: she just left kidney MD and bp was elevated. med change made  Pulse 73   SpO2 94%   Opioid Risk Score:   Fall Risk Score:  `1  Depression screen PHQ 2/9  Depression screen [facility] 2/9 08/06/2017 03/02/2017  Decreased Interest 0 0  Down, Depressed, Hopeless 0 0  PHQ - 2 Score 0 0  Altered sleeping - 0  Tired, decreased energy - 1  Change in appetite - 0  Feeling bad or failure about yourself  - 0  Trouble concentrating - 0  Moving slowly or fidgety/restless - 0  Suicidal thoughts - 0  PHQ-9 Score - 1  Difficult doing work/chores - Not difficult at all    Review of Systems  Constitutional: Negative.   HENT: Negative.   Eyes: Negative.   Respiratory: Positive for cough.   Cardiovascular: Negative.   Gastrointestinal: Negative.   Endocrine: Negative.   Genitourinary:       Peritoneal dialysis nightly  Musculoskeletal:       Spasms  Skin: Negative.   Allergic/Immunologic: Negative.   Neurological: Negative.   Hematological: Negative.   Psychiatric/Behavioral: Negative.   All other systems reviewed and are negative.      Objective:   Physical Exam Well-developed well-nourished female no acute distress Mood and affect are appropriate Motor strength is 3- at the left deltoid, bicep, tricep finger flexors, 0 finger  extensors. Tone modified [place] 1 at the elbow flexion is 1 at the finger flexors and thumb flexor and wrist flexor.  She has 2 beat clonus at the finger flexors of the left hand. Left lower limb has 3- hip flexion or knee extension 3- ankle dorsiflexion Ambulation she does have some toe drag during ambulation swing phase on the left side.  Using a quad cane. No evidence of knee instability. Extremity 1+ dorsum of the hand edema no tenderness to palpation extremity is warm no hyperhidrosis or hyperhidrosis.  No skin color changes       Assessment & Plan:  1.  Left spastic hemiparesis secondary to right frontal intracranial hemorrhage.  She still has gait deviation/gait disorder related to her hemiparesis.  Because of her toe drag I am recommending that she resume wearing her AFO.  Physical  therapy can work on her gait deviation, she has the potential to ambulate without the AFO safely. We will ask outpatient OT to evaluate as well.  She does have left upper extremity hand edema.  Have recommended  Isotoner glove elevation and retrograde massage.  OT can try modalities as well.  Plan Botox injection left upper extremity Left FDS 25 units Left FDP 25 units Left FCR 25 units Left FPL 25 units   Repeat Botox in about 6 weeks

## 2017-08-07 DIAGNOSIS — N186 End stage renal disease: Secondary | ICD-10-CM | POA: Diagnosis not present

## 2017-08-07 DIAGNOSIS — N2581 Secondary hyperparathyroidism of renal origin: Secondary | ICD-10-CM | POA: Diagnosis not present

## 2017-08-07 DIAGNOSIS — R17 Unspecified jaundice: Secondary | ICD-10-CM | POA: Diagnosis not present

## 2017-08-07 DIAGNOSIS — D631 Anemia in chronic kidney disease: Secondary | ICD-10-CM | POA: Diagnosis not present

## 2017-08-07 DIAGNOSIS — D509 Iron deficiency anemia, unspecified: Secondary | ICD-10-CM | POA: Diagnosis not present

## 2017-08-07 DIAGNOSIS — Z5189 Encounter for other specified aftercare: Secondary | ICD-10-CM | POA: Diagnosis not present

## 2017-08-08 DIAGNOSIS — N186 End stage renal disease: Secondary | ICD-10-CM | POA: Diagnosis not present

## 2017-08-08 DIAGNOSIS — D631 Anemia in chronic kidney disease: Secondary | ICD-10-CM | POA: Diagnosis not present

## 2017-08-08 DIAGNOSIS — R17 Unspecified jaundice: Secondary | ICD-10-CM | POA: Diagnosis not present

## 2017-08-08 DIAGNOSIS — N2581 Secondary hyperparathyroidism of renal origin: Secondary | ICD-10-CM | POA: Diagnosis not present

## 2017-08-08 DIAGNOSIS — Z5189 Encounter for other specified aftercare: Secondary | ICD-10-CM | POA: Diagnosis not present

## 2017-08-08 DIAGNOSIS — D509 Iron deficiency anemia, unspecified: Secondary | ICD-10-CM | POA: Diagnosis not present

## 2017-08-09 ENCOUNTER — Ambulatory Visit: Payer: BLUE CROSS/BLUE SHIELD

## 2017-08-09 DIAGNOSIS — D631 Anemia in chronic kidney disease: Secondary | ICD-10-CM | POA: Diagnosis not present

## 2017-08-09 DIAGNOSIS — Z5189 Encounter for other specified aftercare: Secondary | ICD-10-CM | POA: Diagnosis not present

## 2017-08-09 DIAGNOSIS — R17 Unspecified jaundice: Secondary | ICD-10-CM | POA: Diagnosis not present

## 2017-08-09 DIAGNOSIS — N186 End stage renal disease: Secondary | ICD-10-CM | POA: Diagnosis not present

## 2017-08-09 DIAGNOSIS — D509 Iron deficiency anemia, unspecified: Secondary | ICD-10-CM | POA: Diagnosis not present

## 2017-08-09 DIAGNOSIS — N2581 Secondary hyperparathyroidism of renal origin: Secondary | ICD-10-CM | POA: Diagnosis not present

## 2017-08-10 DIAGNOSIS — N2581 Secondary hyperparathyroidism of renal origin: Secondary | ICD-10-CM | POA: Diagnosis not present

## 2017-08-10 DIAGNOSIS — N186 End stage renal disease: Secondary | ICD-10-CM | POA: Diagnosis not present

## 2017-08-10 DIAGNOSIS — D509 Iron deficiency anemia, unspecified: Secondary | ICD-10-CM | POA: Diagnosis not present

## 2017-08-10 DIAGNOSIS — R17 Unspecified jaundice: Secondary | ICD-10-CM | POA: Diagnosis not present

## 2017-08-10 DIAGNOSIS — Z5189 Encounter for other specified aftercare: Secondary | ICD-10-CM | POA: Diagnosis not present

## 2017-08-10 DIAGNOSIS — D631 Anemia in chronic kidney disease: Secondary | ICD-10-CM | POA: Diagnosis not present

## 2017-08-11 DIAGNOSIS — N186 End stage renal disease: Secondary | ICD-10-CM | POA: Diagnosis not present

## 2017-08-11 DIAGNOSIS — Z5189 Encounter for other specified aftercare: Secondary | ICD-10-CM | POA: Diagnosis not present

## 2017-08-11 DIAGNOSIS — D631 Anemia in chronic kidney disease: Secondary | ICD-10-CM | POA: Diagnosis not present

## 2017-08-11 DIAGNOSIS — R17 Unspecified jaundice: Secondary | ICD-10-CM | POA: Diagnosis not present

## 2017-08-11 DIAGNOSIS — D509 Iron deficiency anemia, unspecified: Secondary | ICD-10-CM | POA: Diagnosis not present

## 2017-08-11 DIAGNOSIS — N2581 Secondary hyperparathyroidism of renal origin: Secondary | ICD-10-CM | POA: Diagnosis not present

## 2017-08-12 DIAGNOSIS — Z5189 Encounter for other specified aftercare: Secondary | ICD-10-CM | POA: Diagnosis not present

## 2017-08-12 DIAGNOSIS — N186 End stage renal disease: Secondary | ICD-10-CM | POA: Diagnosis not present

## 2017-08-12 DIAGNOSIS — D631 Anemia in chronic kidney disease: Secondary | ICD-10-CM | POA: Diagnosis not present

## 2017-08-12 DIAGNOSIS — D509 Iron deficiency anemia, unspecified: Secondary | ICD-10-CM | POA: Diagnosis not present

## 2017-08-12 DIAGNOSIS — R17 Unspecified jaundice: Secondary | ICD-10-CM | POA: Diagnosis not present

## 2017-08-12 DIAGNOSIS — N2581 Secondary hyperparathyroidism of renal origin: Secondary | ICD-10-CM | POA: Diagnosis not present

## 2017-08-13 DIAGNOSIS — D631 Anemia in chronic kidney disease: Secondary | ICD-10-CM | POA: Diagnosis not present

## 2017-08-13 DIAGNOSIS — D509 Iron deficiency anemia, unspecified: Secondary | ICD-10-CM | POA: Diagnosis not present

## 2017-08-13 DIAGNOSIS — Z5189 Encounter for other specified aftercare: Secondary | ICD-10-CM | POA: Diagnosis not present

## 2017-08-13 DIAGNOSIS — N2581 Secondary hyperparathyroidism of renal origin: Secondary | ICD-10-CM | POA: Diagnosis not present

## 2017-08-13 DIAGNOSIS — N186 End stage renal disease: Secondary | ICD-10-CM | POA: Diagnosis not present

## 2017-08-13 DIAGNOSIS — R17 Unspecified jaundice: Secondary | ICD-10-CM | POA: Diagnosis not present

## 2017-08-14 DIAGNOSIS — N2581 Secondary hyperparathyroidism of renal origin: Secondary | ICD-10-CM | POA: Diagnosis not present

## 2017-08-14 DIAGNOSIS — N186 End stage renal disease: Secondary | ICD-10-CM | POA: Diagnosis not present

## 2017-08-14 DIAGNOSIS — R17 Unspecified jaundice: Secondary | ICD-10-CM | POA: Diagnosis not present

## 2017-08-14 DIAGNOSIS — Z5189 Encounter for other specified aftercare: Secondary | ICD-10-CM | POA: Diagnosis not present

## 2017-08-14 DIAGNOSIS — D631 Anemia in chronic kidney disease: Secondary | ICD-10-CM | POA: Diagnosis not present

## 2017-08-14 DIAGNOSIS — D509 Iron deficiency anemia, unspecified: Secondary | ICD-10-CM | POA: Diagnosis not present

## 2017-08-15 DIAGNOSIS — N186 End stage renal disease: Secondary | ICD-10-CM | POA: Diagnosis not present

## 2017-08-15 DIAGNOSIS — R17 Unspecified jaundice: Secondary | ICD-10-CM | POA: Diagnosis not present

## 2017-08-15 DIAGNOSIS — D631 Anemia in chronic kidney disease: Secondary | ICD-10-CM | POA: Diagnosis not present

## 2017-08-15 DIAGNOSIS — Z5189 Encounter for other specified aftercare: Secondary | ICD-10-CM | POA: Diagnosis not present

## 2017-08-15 DIAGNOSIS — N2581 Secondary hyperparathyroidism of renal origin: Secondary | ICD-10-CM | POA: Diagnosis not present

## 2017-08-15 DIAGNOSIS — D509 Iron deficiency anemia, unspecified: Secondary | ICD-10-CM | POA: Diagnosis not present

## 2017-08-16 DIAGNOSIS — D631 Anemia in chronic kidney disease: Secondary | ICD-10-CM | POA: Diagnosis not present

## 2017-08-16 DIAGNOSIS — N186 End stage renal disease: Secondary | ICD-10-CM | POA: Diagnosis not present

## 2017-08-16 DIAGNOSIS — N2581 Secondary hyperparathyroidism of renal origin: Secondary | ICD-10-CM | POA: Diagnosis not present

## 2017-08-16 DIAGNOSIS — Z5189 Encounter for other specified aftercare: Secondary | ICD-10-CM | POA: Diagnosis not present

## 2017-08-16 DIAGNOSIS — R17 Unspecified jaundice: Secondary | ICD-10-CM | POA: Diagnosis not present

## 2017-08-16 DIAGNOSIS — D509 Iron deficiency anemia, unspecified: Secondary | ICD-10-CM | POA: Diagnosis not present

## 2017-08-17 DIAGNOSIS — N2581 Secondary hyperparathyroidism of renal origin: Secondary | ICD-10-CM | POA: Diagnosis not present

## 2017-08-17 DIAGNOSIS — D509 Iron deficiency anemia, unspecified: Secondary | ICD-10-CM | POA: Diagnosis not present

## 2017-08-17 DIAGNOSIS — D631 Anemia in chronic kidney disease: Secondary | ICD-10-CM | POA: Diagnosis not present

## 2017-08-17 DIAGNOSIS — R17 Unspecified jaundice: Secondary | ICD-10-CM | POA: Diagnosis not present

## 2017-08-17 DIAGNOSIS — Z5189 Encounter for other specified aftercare: Secondary | ICD-10-CM | POA: Diagnosis not present

## 2017-08-17 DIAGNOSIS — N186 End stage renal disease: Secondary | ICD-10-CM | POA: Diagnosis not present

## 2017-08-18 DIAGNOSIS — N186 End stage renal disease: Secondary | ICD-10-CM | POA: Diagnosis not present

## 2017-08-18 DIAGNOSIS — D631 Anemia in chronic kidney disease: Secondary | ICD-10-CM | POA: Diagnosis not present

## 2017-08-18 DIAGNOSIS — R17 Unspecified jaundice: Secondary | ICD-10-CM | POA: Diagnosis not present

## 2017-08-18 DIAGNOSIS — Z5189 Encounter for other specified aftercare: Secondary | ICD-10-CM | POA: Diagnosis not present

## 2017-08-18 DIAGNOSIS — D509 Iron deficiency anemia, unspecified: Secondary | ICD-10-CM | POA: Diagnosis not present

## 2017-08-18 DIAGNOSIS — N2581 Secondary hyperparathyroidism of renal origin: Secondary | ICD-10-CM | POA: Diagnosis not present

## 2017-08-19 DIAGNOSIS — N2581 Secondary hyperparathyroidism of renal origin: Secondary | ICD-10-CM | POA: Diagnosis not present

## 2017-08-19 DIAGNOSIS — N186 End stage renal disease: Secondary | ICD-10-CM | POA: Diagnosis not present

## 2017-08-19 DIAGNOSIS — R17 Unspecified jaundice: Secondary | ICD-10-CM | POA: Diagnosis not present

## 2017-08-19 DIAGNOSIS — D509 Iron deficiency anemia, unspecified: Secondary | ICD-10-CM | POA: Diagnosis not present

## 2017-08-19 DIAGNOSIS — D631 Anemia in chronic kidney disease: Secondary | ICD-10-CM | POA: Diagnosis not present

## 2017-08-19 DIAGNOSIS — Z5189 Encounter for other specified aftercare: Secondary | ICD-10-CM | POA: Diagnosis not present

## 2017-08-20 DIAGNOSIS — D509 Iron deficiency anemia, unspecified: Secondary | ICD-10-CM | POA: Diagnosis not present

## 2017-08-20 DIAGNOSIS — R17 Unspecified jaundice: Secondary | ICD-10-CM | POA: Diagnosis not present

## 2017-08-20 DIAGNOSIS — D631 Anemia in chronic kidney disease: Secondary | ICD-10-CM | POA: Diagnosis not present

## 2017-08-20 DIAGNOSIS — Z5189 Encounter for other specified aftercare: Secondary | ICD-10-CM | POA: Diagnosis not present

## 2017-08-20 DIAGNOSIS — N2581 Secondary hyperparathyroidism of renal origin: Secondary | ICD-10-CM | POA: Diagnosis not present

## 2017-08-20 DIAGNOSIS — N186 End stage renal disease: Secondary | ICD-10-CM | POA: Diagnosis not present

## 2017-08-21 DIAGNOSIS — Z5189 Encounter for other specified aftercare: Secondary | ICD-10-CM | POA: Diagnosis not present

## 2017-08-21 DIAGNOSIS — N186 End stage renal disease: Secondary | ICD-10-CM | POA: Diagnosis not present

## 2017-08-21 DIAGNOSIS — R17 Unspecified jaundice: Secondary | ICD-10-CM | POA: Diagnosis not present

## 2017-08-21 DIAGNOSIS — D509 Iron deficiency anemia, unspecified: Secondary | ICD-10-CM | POA: Diagnosis not present

## 2017-08-21 DIAGNOSIS — D631 Anemia in chronic kidney disease: Secondary | ICD-10-CM | POA: Diagnosis not present

## 2017-08-21 DIAGNOSIS — N2581 Secondary hyperparathyroidism of renal origin: Secondary | ICD-10-CM | POA: Diagnosis not present

## 2017-08-22 DIAGNOSIS — D631 Anemia in chronic kidney disease: Secondary | ICD-10-CM | POA: Diagnosis not present

## 2017-08-22 DIAGNOSIS — R17 Unspecified jaundice: Secondary | ICD-10-CM | POA: Diagnosis not present

## 2017-08-22 DIAGNOSIS — N186 End stage renal disease: Secondary | ICD-10-CM | POA: Diagnosis not present

## 2017-08-22 DIAGNOSIS — D509 Iron deficiency anemia, unspecified: Secondary | ICD-10-CM | POA: Diagnosis not present

## 2017-08-22 DIAGNOSIS — N2581 Secondary hyperparathyroidism of renal origin: Secondary | ICD-10-CM | POA: Diagnosis not present

## 2017-08-22 DIAGNOSIS — Z5189 Encounter for other specified aftercare: Secondary | ICD-10-CM | POA: Diagnosis not present

## 2017-08-23 DIAGNOSIS — N186 End stage renal disease: Secondary | ICD-10-CM | POA: Diagnosis not present

## 2017-08-23 DIAGNOSIS — R17 Unspecified jaundice: Secondary | ICD-10-CM | POA: Diagnosis not present

## 2017-08-23 DIAGNOSIS — D509 Iron deficiency anemia, unspecified: Secondary | ICD-10-CM | POA: Diagnosis not present

## 2017-08-23 DIAGNOSIS — N2581 Secondary hyperparathyroidism of renal origin: Secondary | ICD-10-CM | POA: Diagnosis not present

## 2017-08-23 DIAGNOSIS — Z5189 Encounter for other specified aftercare: Secondary | ICD-10-CM | POA: Diagnosis not present

## 2017-08-23 DIAGNOSIS — D631 Anemia in chronic kidney disease: Secondary | ICD-10-CM | POA: Diagnosis not present

## 2017-08-24 DIAGNOSIS — D509 Iron deficiency anemia, unspecified: Secondary | ICD-10-CM | POA: Diagnosis not present

## 2017-08-24 DIAGNOSIS — D631 Anemia in chronic kidney disease: Secondary | ICD-10-CM | POA: Diagnosis not present

## 2017-08-24 DIAGNOSIS — N2581 Secondary hyperparathyroidism of renal origin: Secondary | ICD-10-CM | POA: Diagnosis not present

## 2017-08-24 DIAGNOSIS — N186 End stage renal disease: Secondary | ICD-10-CM | POA: Diagnosis not present

## 2017-08-24 DIAGNOSIS — Z5189 Encounter for other specified aftercare: Secondary | ICD-10-CM | POA: Diagnosis not present

## 2017-08-24 DIAGNOSIS — R17 Unspecified jaundice: Secondary | ICD-10-CM | POA: Diagnosis not present

## 2017-08-25 DIAGNOSIS — Z5189 Encounter for other specified aftercare: Secondary | ICD-10-CM | POA: Diagnosis not present

## 2017-08-25 DIAGNOSIS — D509 Iron deficiency anemia, unspecified: Secondary | ICD-10-CM | POA: Diagnosis not present

## 2017-08-25 DIAGNOSIS — R17 Unspecified jaundice: Secondary | ICD-10-CM | POA: Diagnosis not present

## 2017-08-25 DIAGNOSIS — D631 Anemia in chronic kidney disease: Secondary | ICD-10-CM | POA: Diagnosis not present

## 2017-08-25 DIAGNOSIS — N2581 Secondary hyperparathyroidism of renal origin: Secondary | ICD-10-CM | POA: Diagnosis not present

## 2017-08-25 DIAGNOSIS — N186 End stage renal disease: Secondary | ICD-10-CM | POA: Diagnosis not present

## 2017-08-26 DIAGNOSIS — Z5189 Encounter for other specified aftercare: Secondary | ICD-10-CM | POA: Diagnosis not present

## 2017-08-26 DIAGNOSIS — N186 End stage renal disease: Secondary | ICD-10-CM | POA: Diagnosis not present

## 2017-08-26 DIAGNOSIS — D509 Iron deficiency anemia, unspecified: Secondary | ICD-10-CM | POA: Diagnosis not present

## 2017-08-26 DIAGNOSIS — N2581 Secondary hyperparathyroidism of renal origin: Secondary | ICD-10-CM | POA: Diagnosis not present

## 2017-08-26 DIAGNOSIS — R17 Unspecified jaundice: Secondary | ICD-10-CM | POA: Diagnosis not present

## 2017-08-26 DIAGNOSIS — D631 Anemia in chronic kidney disease: Secondary | ICD-10-CM | POA: Diagnosis not present

## 2017-08-27 ENCOUNTER — Other Ambulatory Visit: Payer: Self-pay

## 2017-08-27 ENCOUNTER — Encounter (HOSPITAL_COMMUNITY): Payer: Self-pay | Admitting: *Deleted

## 2017-08-27 ENCOUNTER — Ambulatory Visit (HOSPITAL_COMMUNITY)
Admission: RE | Admit: 2017-08-27 | Discharge: 2017-08-27 | Disposition: A | Discharge status: Home / Self Care | Payer: Medicare Other | Source: Ambulatory Visit | Attending: Gastroenterology | Admitting: Gastroenterology

## 2017-08-27 ENCOUNTER — Encounter (HOSPITAL_COMMUNITY)
Admission: RE | Disposition: A | Discharge status: Home / Self Care | Payer: Self-pay | Source: Ambulatory Visit | Attending: Gastroenterology

## 2017-08-27 DIAGNOSIS — N186 End stage renal disease: Secondary | ICD-10-CM | POA: Insufficient documentation

## 2017-08-27 DIAGNOSIS — I120 Hypertensive chronic kidney disease with stage 5 chronic kidney disease or end stage renal disease: Secondary | ICD-10-CM | POA: Insufficient documentation

## 2017-08-27 DIAGNOSIS — R17 Unspecified jaundice: Secondary | ICD-10-CM | POA: Diagnosis not present

## 2017-08-27 DIAGNOSIS — D509 Iron deficiency anemia, unspecified: Secondary | ICD-10-CM | POA: Insufficient documentation

## 2017-08-27 DIAGNOSIS — Z8719 Personal history of other diseases of the digestive system: Secondary | ICD-10-CM | POA: Insufficient documentation

## 2017-08-27 DIAGNOSIS — Z5189 Encounter for other specified aftercare: Secondary | ICD-10-CM | POA: Diagnosis not present

## 2017-08-27 DIAGNOSIS — K2950 Unspecified chronic gastritis without bleeding: Secondary | ICD-10-CM | POA: Insufficient documentation

## 2017-08-27 DIAGNOSIS — I69854 Hemiplegia and hemiparesis following other cerebrovascular disease affecting left non-dominant side: Secondary | ICD-10-CM | POA: Diagnosis not present

## 2017-08-27 DIAGNOSIS — Z992 Dependence on renal dialysis: Secondary | ICD-10-CM | POA: Insufficient documentation

## 2017-08-27 DIAGNOSIS — Z888 Allergy status to other drugs, medicaments and biological substances status: Secondary | ICD-10-CM | POA: Diagnosis not present

## 2017-08-27 DIAGNOSIS — N2581 Secondary hyperparathyroidism of renal origin: Secondary | ICD-10-CM | POA: Diagnosis not present

## 2017-08-27 DIAGNOSIS — D631 Anemia in chronic kidney disease: Secondary | ICD-10-CM | POA: Diagnosis not present

## 2017-08-27 DIAGNOSIS — K2970 Gastritis, unspecified, without bleeding: Secondary | ICD-10-CM | POA: Diagnosis not present

## 2017-08-27 DIAGNOSIS — D500 Iron deficiency anemia secondary to blood loss (chronic): Secondary | ICD-10-CM

## 2017-08-27 HISTORY — PX: ESOPHAGOGASTRODUODENOSCOPY (EGD) WITH PROPOFOL: SHX5813

## 2017-08-27 SURGERY — ESOPHAGOGASTRODUODENOSCOPY (EGD) WITH PROPOFOL
Anesthesia: Moderate Sedation

## 2017-08-27 MED ORDER — MIDAZOLAM HCL 5 MG/ML IJ SOLN
INTRAMUSCULAR | Status: AC
  Filled 2017-08-27: qty 2

## 2017-08-27 MED ORDER — SODIUM CHLORIDE 0.9 % IV SOLN: INTRAVENOUS | Status: DC

## 2017-08-27 MED ORDER — MIDAZOLAM HCL 10 MG/2ML IJ SOLN
INTRAMUSCULAR | Status: DC | PRN
  Administered 2017-08-27 (×2): 2 mg via INTRAVENOUS

## 2017-08-27 MED ORDER — BUTAMBEN-TETRACAINE-BENZOCAINE 2-2-14 % EX AERO
INHALATION_SPRAY | CUTANEOUS | Status: DC | PRN
  Administered 2017-08-27: 2 via TOPICAL

## 2017-08-27 MED ORDER — FENTANYL CITRATE (PF) 100 MCG/2ML IJ SOLN
INTRAMUSCULAR | Status: AC
  Filled 2017-08-27: qty 2

## 2017-08-27 MED ORDER — FENTANYL CITRATE (PF) 100 MCG/2ML IJ SOLN
INTRAMUSCULAR | Status: DC | PRN
  Administered 2017-08-27 (×2): 25 ug via INTRAVENOUS

## 2017-08-27 SURGICAL SUPPLY — 14 items

## 2017-08-27 NOTE — H&P (Signed)
Terry Juarez HPI: Last year she was evaluated for anemia, melena, and heme positive stool.  Her HGB at that time was at 8.5 g/dL.  She was diagnosed with two small nonbleeding antral ulcers and she was treated with a PPI.  During a routine colonoscopy on 09/14/2016 a repeat EGD was performed and her ulcerations were essentially healed.  Her HGB June and July last year was from 13-18 g/dL.  Her current value on 08/02/2017 was at 12.2 g/dL, but it was lower recently:  05/17/2017 - 12.2, 06/09/2017 - 10.1, 07/08/2017 - 6.5, 07/12/2017 - 7.1.  Approximately 2 months ago she stopped using her PPI.  This past month she was diagnosed with a posterior frontal lobe acute ICH.  The hematoma on follow up was stable.  She has residual left hemiplegia.  Past Medical History:  Diagnosis Date  . Anemia   . Bruises easily   . Dialysis patient ([place])   . ESRD on dialysis ([place])   . Hyperlipidemia   . Hypertension   . Renal disorder    rt renal mass / < functioning of left kidney - being prepared for possible dialysis  . Right renal mass   . Stroke [facility])     Past Surgical History:  Procedure Laterality Date  . AV FISTULA PLACEMENT Right 07/11/2014   Procedure: ARTERIOVENOUS (AV) FISTULA CREATION RIGHT ARM BRACHIO-CEPHALIC WITH ATTEMPTED RADIO-CEPHALIC (AV) FISTULA;  Surgeon: Terry Misty, MD;  Location: [facility];  Service: Vascular;  Laterality: Right;  . COLONOSCOPY WITH PROPOFOL N/A 09/04/2016   Procedure: COLONOSCOPY WITH PROPOFOL;  Surgeon: Terry Ada, MD;  [facility];  Service: Endoscopy;  Laterality: N/A;  . ECTOPIC PREGNANCY SURGERY  1987  . ESOPHAGOGASTRODUODENOSCOPY N/A 07/30/2016   Procedure: ESOPHAGOGASTRODUODENOSCOPY (EGD);  Surgeon: Terry Ada, MD;  Location: [facility] ENDOSCOPY;  Service: Endoscopy;  Laterality: N/A;  Bedside  . ESOPHAGOGASTRODUODENOSCOPY (EGD) WITH PROPOFOL N/A 09/04/2016   Procedure: ESOPHAGOGASTRODUODENOSCOPY (EGD) WITH PROPOFOL;  Surgeon: Terry Ada, MD;  [facility];  Service: Endoscopy;  Laterality: N/A;  . INSERTION OF DIALYSIS CATHETER Right 07/11/2014   Procedure: INSERTION OF DIALYSIS CATHETER IN RIGHT INTERNAL JUGULAR ;  Surgeon: Terry Misty, MD;  Location: [place];  Service: Vascular;  Laterality: Right;  . LAPAROSCOPIC NEPHRECTOMY Right 07/25/2014   Procedure: RIGHT LAPAROSCOPIC RADICAL NEPHRECTOMY ;  Surgeon: Terry Hughs, MD;  [facility];  Service: Urology;  Laterality: Right;  . PATCH ANGIOPLASTY Right 07/11/2014   Procedure: PATCH ANGIOPLASTY OF RIGHT RADIAL ARTERY USING CEPHALIC VEIN.;  Surgeon: Terry Misty, MD;  Location: [place];  Service: Vascular;  Laterality: Right;  . THROMBECTOMY W/ EMBOLECTOMY Right 07/11/2014   Procedure: THROMBECTOMY OF RIGHT RADIAL ARTERY  ;  Surgeon: Terry Misty, MD;  [facility];  Service: Vascular;  Laterality: Right;    Family History  Problem Relation Age of Onset  . Throat cancer Mother        smoked  . Hypertension Father     Social History:  reports that  has never smoked. she has never used smokeless tobacco. She reports that she drinks about 1.2 oz of alcohol per week. She reports that she does not use drugs.  Allergies:  Allergies  Allergen Reactions  . Lisinopril Cough    Medications:  Scheduled:  Continuous: . sodium chloride      No results found for this or any previous visit (from the past 24 hour(s)).   No results found.  ROS:  As  stated above in the HPI otherwise negative.  Blood pressure (!) 185/88, pulse 66, temperature 97.8 F (36.6 C), temperature source Oral, resp. rate 10, height 5\' 5"  (1.651 m), weight 52.2 kg (115 lb), SpO2 97 %.    PE: Gen: NAD, Alert and Oriented HEENT:  [place]/AT, EOMI Neck: Supple, no LAD Lungs: CTA Bilaterally CV: RRR without M/G/R ABM: Soft, NTND, +BS Ext: No C/C/E  Assessment/Plan: 1) Anemia - History of antral ulcers.  I will repeat the EGD.  Terry Juarez D 08/27/2017, 7:36 AM

## 2017-08-27 NOTE — Discharge Instructions (Signed)

## 2017-08-27 NOTE — Op Note (Signed)
[facility] Patient Name: Terry Juarez Procedure Date: 08/27/2017 MRN: [id number] Attending MD: Carol Ada , MD Date of Birth: [birth year] CSN: [id number] Age: 52 Admit Type: Outpatient Procedure:                Upper GI endoscopy Indications:              Iron deficiency anemia Providers:                Carol Ada, MD, Burtis Junes, RN, Laurena Spies,                            Technician Referring MD:              Medicines:                Midazolam 4 mg IV, Fentanyl 50 micrograms IV Complications:            No immediate complications. Estimated Blood Loss:     Estimated blood loss was minimal. Procedure:                Pre-Anesthesia Assessment:                           - Prior to the procedure, a History and Physical                            was performed, and patient medications and                            allergies were reviewed. The patient's tolerance of                            previous anesthesia was also reviewed. The risks                            and benefits of the procedure and the sedation                            options and risks were discussed with the patient.                            All questions were answered, and informed consent                            was obtained. Prior Anticoagulants: The patient has                            taken no previous anticoagulant or antiplatelet                            agents. ASA Grade Assessment: III - A patient with                            severe systemic disease. After reviewing the risks  and benefits, the patient was deemed in                            satisfactory condition to undergo the procedure.                           - Sedation was administered by an endoscopy nurse.                            The sedation level attained was moderate.                           After obtaining informed consent, the endoscope was   passed under direct vision. Throughout the                            procedure, the patient's blood pressure, pulse, and                            oxygen saturations were monitored continuously. The                            EG-[id number] ([id number]) scope was introduced through the                            mouth, and advanced to the second part of duodenum.                            The upper GI endoscopy was accomplished without                            difficulty. The patient tolerated the procedure                            well. Scope In: Scope Out: Findings:      The esophagus was normal.      Diffuse moderate inflammation characterized by congestion (edema),       erosions, erythema, friability, granularity and mucus was found in the       entire examined stomach. Biopsies were taken with a cold forceps for       histology.      The examined duodenum was normal. Impression:               - Normal esophagus.                           - Gastritis. Biopsied.                           - Normal examined duodenum. Moderate Sedation:      Moderate (conscious) sedation was administered by the endoscopy nurse       and supervised by the endoscopist. The following parameters were       monitored: oxygen saturation, heart rate, blood pressure, and response       to care. Recommendation:           - Patient  has a contact number available for                            emergencies. The signs and symptoms of potential                            delayed complications were discussed with the                            patient. Return to normal activities tomorrow.                            Written discharge instructions were provided to the                            patient.                           - Resume previous diet.                           - Continue present medications.                           - Await pathology results. Procedure Code(s):        --- Professional ---                            [phone], Esophagogastroduodenoscopy, flexible,                            transoral; with biopsy, single or multiple Diagnosis Code(s):        --- Professional ---                           K29.70, Gastritis, unspecified, without bleeding                           D50.9, Iron deficiency anemia, unspecified CPT copyright 2016 [employer] Association. All rights reserved. The codes documented in this report are preliminary and upon coder review may  be revised to meet current compliance requirements. Carol Ada, MD Carol Ada, MD 08/27/2017 9:40:55 AM This report has been signed electronically. Number of Addenda: 0

## 2017-08-28 DIAGNOSIS — Z5189 Encounter for other specified aftercare: Secondary | ICD-10-CM | POA: Diagnosis not present

## 2017-08-28 DIAGNOSIS — R17 Unspecified jaundice: Secondary | ICD-10-CM | POA: Diagnosis not present

## 2017-08-28 DIAGNOSIS — N2581 Secondary hyperparathyroidism of renal origin: Secondary | ICD-10-CM | POA: Diagnosis not present

## 2017-08-28 DIAGNOSIS — D509 Iron deficiency anemia, unspecified: Secondary | ICD-10-CM | POA: Diagnosis not present

## 2017-08-28 DIAGNOSIS — D631 Anemia in chronic kidney disease: Secondary | ICD-10-CM | POA: Diagnosis not present

## 2017-08-28 DIAGNOSIS — N186 End stage renal disease: Secondary | ICD-10-CM | POA: Diagnosis not present

## 2017-08-29 DIAGNOSIS — D631 Anemia in chronic kidney disease: Secondary | ICD-10-CM | POA: Diagnosis not present

## 2017-08-29 DIAGNOSIS — N2581 Secondary hyperparathyroidism of renal origin: Secondary | ICD-10-CM | POA: Diagnosis not present

## 2017-08-29 DIAGNOSIS — R17 Unspecified jaundice: Secondary | ICD-10-CM | POA: Diagnosis not present

## 2017-08-29 DIAGNOSIS — Z5189 Encounter for other specified aftercare: Secondary | ICD-10-CM | POA: Diagnosis not present

## 2017-08-29 DIAGNOSIS — N186 End stage renal disease: Secondary | ICD-10-CM | POA: Diagnosis not present

## 2017-08-29 DIAGNOSIS — D509 Iron deficiency anemia, unspecified: Secondary | ICD-10-CM | POA: Diagnosis not present

## 2017-08-30 DIAGNOSIS — R17 Unspecified jaundice: Secondary | ICD-10-CM | POA: Diagnosis not present

## 2017-08-30 DIAGNOSIS — Z5189 Encounter for other specified aftercare: Secondary | ICD-10-CM | POA: Diagnosis not present

## 2017-08-30 DIAGNOSIS — D509 Iron deficiency anemia, unspecified: Secondary | ICD-10-CM | POA: Diagnosis not present

## 2017-08-30 DIAGNOSIS — N2581 Secondary hyperparathyroidism of renal origin: Secondary | ICD-10-CM | POA: Diagnosis not present

## 2017-08-30 DIAGNOSIS — N186 End stage renal disease: Secondary | ICD-10-CM | POA: Diagnosis not present

## 2017-08-30 DIAGNOSIS — D631 Anemia in chronic kidney disease: Secondary | ICD-10-CM | POA: Diagnosis not present

## 2017-08-31 DIAGNOSIS — N186 End stage renal disease: Secondary | ICD-10-CM | POA: Diagnosis not present

## 2017-08-31 DIAGNOSIS — N2581 Secondary hyperparathyroidism of renal origin: Secondary | ICD-10-CM | POA: Diagnosis not present

## 2017-08-31 DIAGNOSIS — Z5189 Encounter for other specified aftercare: Secondary | ICD-10-CM | POA: Diagnosis not present

## 2017-08-31 DIAGNOSIS — R17 Unspecified jaundice: Secondary | ICD-10-CM | POA: Diagnosis not present

## 2017-08-31 DIAGNOSIS — D509 Iron deficiency anemia, unspecified: Secondary | ICD-10-CM | POA: Diagnosis not present

## 2017-08-31 DIAGNOSIS — D631 Anemia in chronic kidney disease: Secondary | ICD-10-CM | POA: Diagnosis not present

## 2017-09-01 DIAGNOSIS — Z5189 Encounter for other specified aftercare: Secondary | ICD-10-CM | POA: Diagnosis not present

## 2017-09-01 DIAGNOSIS — N2581 Secondary hyperparathyroidism of renal origin: Secondary | ICD-10-CM | POA: Diagnosis not present

## 2017-09-01 DIAGNOSIS — N186 End stage renal disease: Secondary | ICD-10-CM | POA: Diagnosis not present

## 2017-09-01 DIAGNOSIS — D509 Iron deficiency anemia, unspecified: Secondary | ICD-10-CM | POA: Diagnosis not present

## 2017-09-01 DIAGNOSIS — D631 Anemia in chronic kidney disease: Secondary | ICD-10-CM | POA: Diagnosis not present

## 2017-09-01 DIAGNOSIS — R17 Unspecified jaundice: Secondary | ICD-10-CM | POA: Diagnosis not present

## 2017-09-02 DIAGNOSIS — Z5189 Encounter for other specified aftercare: Secondary | ICD-10-CM | POA: Diagnosis not present

## 2017-09-02 DIAGNOSIS — N2581 Secondary hyperparathyroidism of renal origin: Secondary | ICD-10-CM | POA: Diagnosis not present

## 2017-09-02 DIAGNOSIS — D631 Anemia in chronic kidney disease: Secondary | ICD-10-CM | POA: Diagnosis not present

## 2017-09-02 DIAGNOSIS — N186 End stage renal disease: Secondary | ICD-10-CM | POA: Diagnosis not present

## 2017-09-02 DIAGNOSIS — R17 Unspecified jaundice: Secondary | ICD-10-CM | POA: Diagnosis not present

## 2017-09-02 DIAGNOSIS — D509 Iron deficiency anemia, unspecified: Secondary | ICD-10-CM | POA: Diagnosis not present

## 2017-09-03 DIAGNOSIS — N041 Nephrotic syndrome with focal and segmental glomerular lesions: Secondary | ICD-10-CM | POA: Diagnosis not present

## 2017-09-03 DIAGNOSIS — E440 Moderate protein-calorie malnutrition: Secondary | ICD-10-CM | POA: Diagnosis not present

## 2017-09-03 DIAGNOSIS — Z5189 Encounter for other specified aftercare: Secondary | ICD-10-CM | POA: Diagnosis not present

## 2017-09-03 DIAGNOSIS — D509 Iron deficiency anemia, unspecified: Secondary | ICD-10-CM | POA: Diagnosis not present

## 2017-09-03 DIAGNOSIS — Z79899 Other long term (current) drug therapy: Secondary | ICD-10-CM | POA: Diagnosis not present

## 2017-09-03 DIAGNOSIS — D631 Anemia in chronic kidney disease: Secondary | ICD-10-CM | POA: Diagnosis not present

## 2017-09-03 DIAGNOSIS — Z992 Dependence on renal dialysis: Secondary | ICD-10-CM | POA: Diagnosis not present

## 2017-09-03 DIAGNOSIS — N2581 Secondary hyperparathyroidism of renal origin: Secondary | ICD-10-CM | POA: Diagnosis not present

## 2017-09-03 DIAGNOSIS — R17 Unspecified jaundice: Secondary | ICD-10-CM | POA: Diagnosis not present

## 2017-09-03 DIAGNOSIS — N186 End stage renal disease: Secondary | ICD-10-CM | POA: Diagnosis not present

## 2017-09-04 DIAGNOSIS — R17 Unspecified jaundice: Secondary | ICD-10-CM | POA: Diagnosis not present

## 2017-09-04 DIAGNOSIS — N2581 Secondary hyperparathyroidism of renal origin: Secondary | ICD-10-CM | POA: Diagnosis not present

## 2017-09-04 DIAGNOSIS — D631 Anemia in chronic kidney disease: Secondary | ICD-10-CM | POA: Diagnosis not present

## 2017-09-04 DIAGNOSIS — D509 Iron deficiency anemia, unspecified: Secondary | ICD-10-CM | POA: Diagnosis not present

## 2017-09-04 DIAGNOSIS — N186 End stage renal disease: Secondary | ICD-10-CM | POA: Diagnosis not present

## 2017-09-04 DIAGNOSIS — E440 Moderate protein-calorie malnutrition: Secondary | ICD-10-CM | POA: Diagnosis not present

## 2017-09-05 DIAGNOSIS — E440 Moderate protein-calorie malnutrition: Secondary | ICD-10-CM | POA: Diagnosis not present

## 2017-09-05 DIAGNOSIS — D509 Iron deficiency anemia, unspecified: Secondary | ICD-10-CM | POA: Diagnosis not present

## 2017-09-05 DIAGNOSIS — D631 Anemia in chronic kidney disease: Secondary | ICD-10-CM | POA: Diagnosis not present

## 2017-09-05 DIAGNOSIS — N2581 Secondary hyperparathyroidism of renal origin: Secondary | ICD-10-CM | POA: Diagnosis not present

## 2017-09-05 DIAGNOSIS — R17 Unspecified jaundice: Secondary | ICD-10-CM | POA: Diagnosis not present

## 2017-09-05 DIAGNOSIS — N186 End stage renal disease: Secondary | ICD-10-CM | POA: Diagnosis not present

## 2017-09-06 DIAGNOSIS — R17 Unspecified jaundice: Secondary | ICD-10-CM | POA: Diagnosis not present

## 2017-09-06 DIAGNOSIS — D631 Anemia in chronic kidney disease: Secondary | ICD-10-CM | POA: Diagnosis not present

## 2017-09-06 DIAGNOSIS — N186 End stage renal disease: Secondary | ICD-10-CM | POA: Diagnosis not present

## 2017-09-06 DIAGNOSIS — N2581 Secondary hyperparathyroidism of renal origin: Secondary | ICD-10-CM | POA: Diagnosis not present

## 2017-09-06 DIAGNOSIS — E440 Moderate protein-calorie malnutrition: Secondary | ICD-10-CM | POA: Diagnosis not present

## 2017-09-06 DIAGNOSIS — D509 Iron deficiency anemia, unspecified: Secondary | ICD-10-CM | POA: Diagnosis not present

## 2017-09-07 DIAGNOSIS — D509 Iron deficiency anemia, unspecified: Secondary | ICD-10-CM | POA: Diagnosis not present

## 2017-09-07 DIAGNOSIS — N186 End stage renal disease: Secondary | ICD-10-CM | POA: Diagnosis not present

## 2017-09-07 DIAGNOSIS — N2581 Secondary hyperparathyroidism of renal origin: Secondary | ICD-10-CM | POA: Diagnosis not present

## 2017-09-07 DIAGNOSIS — R17 Unspecified jaundice: Secondary | ICD-10-CM | POA: Diagnosis not present

## 2017-09-07 DIAGNOSIS — E440 Moderate protein-calorie malnutrition: Secondary | ICD-10-CM | POA: Diagnosis not present

## 2017-09-07 DIAGNOSIS — D631 Anemia in chronic kidney disease: Secondary | ICD-10-CM | POA: Diagnosis not present

## 2017-09-08 DIAGNOSIS — E440 Moderate protein-calorie malnutrition: Secondary | ICD-10-CM | POA: Diagnosis not present

## 2017-09-08 DIAGNOSIS — D631 Anemia in chronic kidney disease: Secondary | ICD-10-CM | POA: Diagnosis not present

## 2017-09-08 DIAGNOSIS — N2581 Secondary hyperparathyroidism of renal origin: Secondary | ICD-10-CM | POA: Diagnosis not present

## 2017-09-08 DIAGNOSIS — R17 Unspecified jaundice: Secondary | ICD-10-CM | POA: Diagnosis not present

## 2017-09-08 DIAGNOSIS — D509 Iron deficiency anemia, unspecified: Secondary | ICD-10-CM | POA: Diagnosis not present

## 2017-09-08 DIAGNOSIS — N186 End stage renal disease: Secondary | ICD-10-CM | POA: Diagnosis not present

## 2017-09-09 DIAGNOSIS — D631 Anemia in chronic kidney disease: Secondary | ICD-10-CM | POA: Diagnosis not present

## 2017-09-09 DIAGNOSIS — E440 Moderate protein-calorie malnutrition: Secondary | ICD-10-CM | POA: Diagnosis not present

## 2017-09-09 DIAGNOSIS — D509 Iron deficiency anemia, unspecified: Secondary | ICD-10-CM | POA: Diagnosis not present

## 2017-09-09 DIAGNOSIS — N2581 Secondary hyperparathyroidism of renal origin: Secondary | ICD-10-CM | POA: Diagnosis not present

## 2017-09-09 DIAGNOSIS — R17 Unspecified jaundice: Secondary | ICD-10-CM | POA: Diagnosis not present

## 2017-09-09 DIAGNOSIS — N186 End stage renal disease: Secondary | ICD-10-CM | POA: Diagnosis not present

## 2017-09-10 DIAGNOSIS — D509 Iron deficiency anemia, unspecified: Secondary | ICD-10-CM | POA: Diagnosis not present

## 2017-09-10 DIAGNOSIS — E440 Moderate protein-calorie malnutrition: Secondary | ICD-10-CM | POA: Diagnosis not present

## 2017-09-10 DIAGNOSIS — N186 End stage renal disease: Secondary | ICD-10-CM | POA: Diagnosis not present

## 2017-09-10 DIAGNOSIS — D631 Anemia in chronic kidney disease: Secondary | ICD-10-CM | POA: Diagnosis not present

## 2017-09-10 DIAGNOSIS — N2581 Secondary hyperparathyroidism of renal origin: Secondary | ICD-10-CM | POA: Diagnosis not present

## 2017-09-10 DIAGNOSIS — E890 Postprocedural hypothyroidism: Secondary | ICD-10-CM | POA: Diagnosis not present

## 2017-09-10 DIAGNOSIS — R82998 Other abnormal findings in urine: Secondary | ICD-10-CM | POA: Diagnosis not present

## 2017-09-10 DIAGNOSIS — R17 Unspecified jaundice: Secondary | ICD-10-CM | POA: Diagnosis not present

## 2017-09-11 DIAGNOSIS — N186 End stage renal disease: Secondary | ICD-10-CM | POA: Diagnosis not present

## 2017-09-11 DIAGNOSIS — D509 Iron deficiency anemia, unspecified: Secondary | ICD-10-CM | POA: Diagnosis not present

## 2017-09-11 DIAGNOSIS — R17 Unspecified jaundice: Secondary | ICD-10-CM | POA: Diagnosis not present

## 2017-09-11 DIAGNOSIS — N2581 Secondary hyperparathyroidism of renal origin: Secondary | ICD-10-CM | POA: Diagnosis not present

## 2017-09-11 DIAGNOSIS — E440 Moderate protein-calorie malnutrition: Secondary | ICD-10-CM | POA: Diagnosis not present

## 2017-09-11 DIAGNOSIS — D631 Anemia in chronic kidney disease: Secondary | ICD-10-CM | POA: Diagnosis not present

## 2017-09-12 DIAGNOSIS — R17 Unspecified jaundice: Secondary | ICD-10-CM | POA: Diagnosis not present

## 2017-09-12 DIAGNOSIS — E440 Moderate protein-calorie malnutrition: Secondary | ICD-10-CM | POA: Diagnosis not present

## 2017-09-12 DIAGNOSIS — D631 Anemia in chronic kidney disease: Secondary | ICD-10-CM | POA: Diagnosis not present

## 2017-09-12 DIAGNOSIS — N2581 Secondary hyperparathyroidism of renal origin: Secondary | ICD-10-CM | POA: Diagnosis not present

## 2017-09-12 DIAGNOSIS — N186 End stage renal disease: Secondary | ICD-10-CM | POA: Diagnosis not present

## 2017-09-12 DIAGNOSIS — D509 Iron deficiency anemia, unspecified: Secondary | ICD-10-CM | POA: Diagnosis not present

## 2017-09-13 DIAGNOSIS — D631 Anemia in chronic kidney disease: Secondary | ICD-10-CM | POA: Diagnosis not present

## 2017-09-13 DIAGNOSIS — R17 Unspecified jaundice: Secondary | ICD-10-CM | POA: Diagnosis not present

## 2017-09-13 DIAGNOSIS — N2581 Secondary hyperparathyroidism of renal origin: Secondary | ICD-10-CM | POA: Diagnosis not present

## 2017-09-13 DIAGNOSIS — N186 End stage renal disease: Secondary | ICD-10-CM | POA: Diagnosis not present

## 2017-09-13 DIAGNOSIS — D509 Iron deficiency anemia, unspecified: Secondary | ICD-10-CM | POA: Diagnosis not present

## 2017-09-13 DIAGNOSIS — E440 Moderate protein-calorie malnutrition: Secondary | ICD-10-CM | POA: Diagnosis not present

## 2017-09-14 DIAGNOSIS — N186 End stage renal disease: Secondary | ICD-10-CM | POA: Diagnosis not present

## 2017-09-14 DIAGNOSIS — D631 Anemia in chronic kidney disease: Secondary | ICD-10-CM | POA: Diagnosis not present

## 2017-09-14 DIAGNOSIS — D509 Iron deficiency anemia, unspecified: Secondary | ICD-10-CM | POA: Diagnosis not present

## 2017-09-14 DIAGNOSIS — E440 Moderate protein-calorie malnutrition: Secondary | ICD-10-CM | POA: Diagnosis not present

## 2017-09-14 DIAGNOSIS — R17 Unspecified jaundice: Secondary | ICD-10-CM | POA: Diagnosis not present

## 2017-09-14 DIAGNOSIS — N2581 Secondary hyperparathyroidism of renal origin: Secondary | ICD-10-CM | POA: Diagnosis not present

## 2017-09-15 DIAGNOSIS — D631 Anemia in chronic kidney disease: Secondary | ICD-10-CM | POA: Diagnosis not present

## 2017-09-15 DIAGNOSIS — N186 End stage renal disease: Secondary | ICD-10-CM | POA: Diagnosis not present

## 2017-09-15 DIAGNOSIS — D509 Iron deficiency anemia, unspecified: Secondary | ICD-10-CM | POA: Diagnosis not present

## 2017-09-15 DIAGNOSIS — E440 Moderate protein-calorie malnutrition: Secondary | ICD-10-CM | POA: Diagnosis not present

## 2017-09-15 DIAGNOSIS — R17 Unspecified jaundice: Secondary | ICD-10-CM | POA: Diagnosis not present

## 2017-09-15 DIAGNOSIS — N2581 Secondary hyperparathyroidism of renal origin: Secondary | ICD-10-CM | POA: Diagnosis not present

## 2017-09-16 DIAGNOSIS — D509 Iron deficiency anemia, unspecified: Secondary | ICD-10-CM | POA: Diagnosis not present

## 2017-09-16 DIAGNOSIS — E440 Moderate protein-calorie malnutrition: Secondary | ICD-10-CM | POA: Diagnosis not present

## 2017-09-16 DIAGNOSIS — N186 End stage renal disease: Secondary | ICD-10-CM | POA: Diagnosis not present

## 2017-09-16 DIAGNOSIS — R17 Unspecified jaundice: Secondary | ICD-10-CM | POA: Diagnosis not present

## 2017-09-16 DIAGNOSIS — D631 Anemia in chronic kidney disease: Secondary | ICD-10-CM | POA: Diagnosis not present

## 2017-09-16 DIAGNOSIS — N2581 Secondary hyperparathyroidism of renal origin: Secondary | ICD-10-CM | POA: Diagnosis not present

## 2017-09-17 ENCOUNTER — Ambulatory Visit: Payer: Medicare Other | Admitting: Physical Medicine & Rehabilitation

## 2017-09-17 ENCOUNTER — Encounter: Payer: Medicare Other | Attending: Physical Medicine & Rehabilitation

## 2017-09-17 DIAGNOSIS — R17 Unspecified jaundice: Secondary | ICD-10-CM | POA: Diagnosis not present

## 2017-09-17 DIAGNOSIS — N186 End stage renal disease: Secondary | ICD-10-CM | POA: Insufficient documentation

## 2017-09-17 DIAGNOSIS — D631 Anemia in chronic kidney disease: Secondary | ICD-10-CM | POA: Diagnosis not present

## 2017-09-17 DIAGNOSIS — Z992 Dependence on renal dialysis: Secondary | ICD-10-CM | POA: Insufficient documentation

## 2017-09-17 DIAGNOSIS — I120 Hypertensive chronic kidney disease with stage 5 chronic kidney disease or end stage renal disease: Secondary | ICD-10-CM | POA: Insufficient documentation

## 2017-09-17 DIAGNOSIS — E440 Moderate protein-calorie malnutrition: Secondary | ICD-10-CM | POA: Diagnosis not present

## 2017-09-17 DIAGNOSIS — E785 Hyperlipidemia, unspecified: Secondary | ICD-10-CM | POA: Insufficient documentation

## 2017-09-17 DIAGNOSIS — N2581 Secondary hyperparathyroidism of renal origin: Secondary | ICD-10-CM | POA: Diagnosis not present

## 2017-09-17 DIAGNOSIS — D509 Iron deficiency anemia, unspecified: Secondary | ICD-10-CM | POA: Diagnosis not present

## 2017-09-17 DIAGNOSIS — I69354 Hemiplegia and hemiparesis following cerebral infarction affecting left non-dominant side: Secondary | ICD-10-CM | POA: Insufficient documentation

## 2017-09-18 DIAGNOSIS — D631 Anemia in chronic kidney disease: Secondary | ICD-10-CM | POA: Diagnosis not present

## 2017-09-18 DIAGNOSIS — R17 Unspecified jaundice: Secondary | ICD-10-CM | POA: Diagnosis not present

## 2017-09-18 DIAGNOSIS — N186 End stage renal disease: Secondary | ICD-10-CM | POA: Diagnosis not present

## 2017-09-18 DIAGNOSIS — N2581 Secondary hyperparathyroidism of renal origin: Secondary | ICD-10-CM | POA: Diagnosis not present

## 2017-09-18 DIAGNOSIS — D509 Iron deficiency anemia, unspecified: Secondary | ICD-10-CM | POA: Diagnosis not present

## 2017-09-18 DIAGNOSIS — E440 Moderate protein-calorie malnutrition: Secondary | ICD-10-CM | POA: Diagnosis not present

## 2017-09-19 DIAGNOSIS — D509 Iron deficiency anemia, unspecified: Secondary | ICD-10-CM | POA: Diagnosis not present

## 2017-09-19 DIAGNOSIS — E440 Moderate protein-calorie malnutrition: Secondary | ICD-10-CM | POA: Diagnosis not present

## 2017-09-19 DIAGNOSIS — N186 End stage renal disease: Secondary | ICD-10-CM | POA: Diagnosis not present

## 2017-09-19 DIAGNOSIS — R17 Unspecified jaundice: Secondary | ICD-10-CM | POA: Diagnosis not present

## 2017-09-19 DIAGNOSIS — N2581 Secondary hyperparathyroidism of renal origin: Secondary | ICD-10-CM | POA: Diagnosis not present

## 2017-09-19 DIAGNOSIS — D631 Anemia in chronic kidney disease: Secondary | ICD-10-CM | POA: Diagnosis not present

## 2017-09-20 DIAGNOSIS — N186 End stage renal disease: Secondary | ICD-10-CM | POA: Diagnosis not present

## 2017-09-20 DIAGNOSIS — D509 Iron deficiency anemia, unspecified: Secondary | ICD-10-CM | POA: Diagnosis not present

## 2017-09-20 DIAGNOSIS — D631 Anemia in chronic kidney disease: Secondary | ICD-10-CM | POA: Diagnosis not present

## 2017-09-20 DIAGNOSIS — R17 Unspecified jaundice: Secondary | ICD-10-CM | POA: Diagnosis not present

## 2017-09-20 DIAGNOSIS — N2581 Secondary hyperparathyroidism of renal origin: Secondary | ICD-10-CM | POA: Diagnosis not present

## 2017-09-20 DIAGNOSIS — E440 Moderate protein-calorie malnutrition: Secondary | ICD-10-CM | POA: Diagnosis not present

## 2017-09-21 DIAGNOSIS — D509 Iron deficiency anemia, unspecified: Secondary | ICD-10-CM | POA: Diagnosis not present

## 2017-09-21 DIAGNOSIS — E440 Moderate protein-calorie malnutrition: Secondary | ICD-10-CM | POA: Diagnosis not present

## 2017-09-21 DIAGNOSIS — R17 Unspecified jaundice: Secondary | ICD-10-CM | POA: Diagnosis not present

## 2017-09-21 DIAGNOSIS — D631 Anemia in chronic kidney disease: Secondary | ICD-10-CM | POA: Diagnosis not present

## 2017-09-21 DIAGNOSIS — N2581 Secondary hyperparathyroidism of renal origin: Secondary | ICD-10-CM | POA: Diagnosis not present

## 2017-09-21 DIAGNOSIS — N186 End stage renal disease: Secondary | ICD-10-CM | POA: Diagnosis not present

## 2017-09-22 DIAGNOSIS — N2581 Secondary hyperparathyroidism of renal origin: Secondary | ICD-10-CM | POA: Diagnosis not present

## 2017-09-22 DIAGNOSIS — N186 End stage renal disease: Secondary | ICD-10-CM | POA: Diagnosis not present

## 2017-09-22 DIAGNOSIS — D509 Iron deficiency anemia, unspecified: Secondary | ICD-10-CM | POA: Diagnosis not present

## 2017-09-22 DIAGNOSIS — R17 Unspecified jaundice: Secondary | ICD-10-CM | POA: Diagnosis not present

## 2017-09-22 DIAGNOSIS — D631 Anemia in chronic kidney disease: Secondary | ICD-10-CM | POA: Diagnosis not present

## 2017-09-22 DIAGNOSIS — E440 Moderate protein-calorie malnutrition: Secondary | ICD-10-CM | POA: Diagnosis not present

## 2017-09-23 DIAGNOSIS — R17 Unspecified jaundice: Secondary | ICD-10-CM | POA: Diagnosis not present

## 2017-09-23 DIAGNOSIS — D631 Anemia in chronic kidney disease: Secondary | ICD-10-CM | POA: Diagnosis not present

## 2017-09-23 DIAGNOSIS — N186 End stage renal disease: Secondary | ICD-10-CM | POA: Diagnosis not present

## 2017-09-23 DIAGNOSIS — E440 Moderate protein-calorie malnutrition: Secondary | ICD-10-CM | POA: Diagnosis not present

## 2017-09-23 DIAGNOSIS — D509 Iron deficiency anemia, unspecified: Secondary | ICD-10-CM | POA: Diagnosis not present

## 2017-09-23 DIAGNOSIS — N2581 Secondary hyperparathyroidism of renal origin: Secondary | ICD-10-CM | POA: Diagnosis not present

## 2017-09-24 ENCOUNTER — Emergency Department (HOSPITAL_COMMUNITY): Payer: Medicare Other

## 2017-09-24 ENCOUNTER — Other Ambulatory Visit: Payer: Self-pay

## 2017-09-24 ENCOUNTER — Emergency Department (HOSPITAL_COMMUNITY)
Admission: EM | Admit: 2017-09-24 | Discharge: 2017-09-25 | Disposition: A | Discharge status: Home / Self Care | Payer: Medicare Other | Attending: Emergency Medicine | Admitting: Emergency Medicine

## 2017-09-24 ENCOUNTER — Encounter (HOSPITAL_COMMUNITY): Payer: Self-pay | Admitting: Emergency Medicine

## 2017-09-24 DIAGNOSIS — N186 End stage renal disease: Secondary | ICD-10-CM | POA: Diagnosis not present

## 2017-09-24 DIAGNOSIS — R1030 Lower abdominal pain, unspecified: Secondary | ICD-10-CM | POA: Diagnosis not present

## 2017-09-24 DIAGNOSIS — Z992 Dependence on renal dialysis: Secondary | ICD-10-CM | POA: Diagnosis not present

## 2017-09-24 DIAGNOSIS — D631 Anemia in chronic kidney disease: Secondary | ICD-10-CM | POA: Diagnosis not present

## 2017-09-24 DIAGNOSIS — R079 Chest pain, unspecified: Secondary | ICD-10-CM | POA: Diagnosis not present

## 2017-09-24 DIAGNOSIS — I120 Hypertensive chronic kidney disease with stage 5 chronic kidney disease or end stage renal disease: Secondary | ICD-10-CM | POA: Diagnosis not present

## 2017-09-24 DIAGNOSIS — R17 Unspecified jaundice: Secondary | ICD-10-CM | POA: Diagnosis not present

## 2017-09-24 DIAGNOSIS — N2581 Secondary hyperparathyroidism of renal origin: Secondary | ICD-10-CM | POA: Diagnosis not present

## 2017-09-24 DIAGNOSIS — R1032 Left lower quadrant pain: Secondary | ICD-10-CM

## 2017-09-24 DIAGNOSIS — E440 Moderate protein-calorie malnutrition: Secondary | ICD-10-CM | POA: Diagnosis not present

## 2017-09-24 DIAGNOSIS — Z7982 Long term (current) use of aspirin: Secondary | ICD-10-CM | POA: Insufficient documentation

## 2017-09-24 DIAGNOSIS — Z79899 Other long term (current) drug therapy: Secondary | ICD-10-CM | POA: Insufficient documentation

## 2017-09-24 DIAGNOSIS — D509 Iron deficiency anemia, unspecified: Secondary | ICD-10-CM | POA: Diagnosis not present

## 2017-09-24 DIAGNOSIS — K529 Noninfective gastroenteritis and colitis, unspecified: Secondary | ICD-10-CM | POA: Insufficient documentation

## 2017-09-24 LAB — COMPREHENSIVE METABOLIC PANEL
ALK PHOS: 58 U/L (ref 38–126)
ALT: 72 U/L — AB (ref 14–54)
AST: 51 U/L — ABNORMAL HIGH (ref 15–41)
Albumin: 2.8 g/dL — ABNORMAL LOW (ref 3.5–5.0)
Anion gap: 25 — ABNORMAL HIGH (ref 5–15)
BILIRUBIN TOTAL: 0.6 mg/dL (ref 0.3–1.2)
BUN: 103 mg/dL — ABNORMAL HIGH (ref 6–20)
CALCIUM: 9 mg/dL (ref 8.9–10.3)
CO2: 17 mmol/L — AB (ref 22–32)
CREATININE: 16.21 mg/dL — AB (ref 0.44–1.00)
Chloride: 94 mmol/L — ABNORMAL LOW (ref 101–111)
GFR calc Af Amer: 3 mL/min — ABNORMAL LOW (ref >60)
GFR calc non Af Amer: 2 mL/min — ABNORMAL LOW (ref >60)
Glucose, Bld: 169 mg/dL — ABNORMAL HIGH (ref 65–99)
Potassium: 4.9 mmol/L (ref 3.5–5.1)
SODIUM: 136 mmol/L (ref 135–145)
TOTAL PROTEIN: 6.6 g/dL (ref 6.5–8.1)

## 2017-09-24 LAB — I-STAT TROPONIN, ED: Troponin i, poc: 0.04 ng/mL (ref 0.00–0.08)

## 2017-09-24 LAB — CBC
HCT: 34.5 % — ABNORMAL LOW (ref 36.0–46.0)
Hemoglobin: 10.9 g/dL — ABNORMAL LOW (ref 12.0–15.0)
MCH: 30.7 pg (ref 26.0–34.0)
MCHC: 31.6 g/dL (ref 30.0–36.0)
MCV: 97.2 fL (ref 78.0–100.0)
PLATELETS: 382 10*3/uL (ref 150–400)
RBC: 3.55 MIL/uL — AB (ref 3.87–5.11)
RDW: 13.9 % (ref 11.5–15.5)
WBC: 13.8 10*3/uL — ABNORMAL HIGH (ref 4.0–10.5)

## 2017-09-24 LAB — HCG, QUANTITATIVE, PREGNANCY: hCG, Beta Chain, Quant, S: 7 m[IU]/mL — ABNORMAL HIGH (ref <5)

## 2017-09-24 LAB — LIPASE, BLOOD: Lipase: 65 U/L — ABNORMAL HIGH (ref 11–51)

## 2017-09-24 LAB — I-STAT BETA HCG BLOOD, ED (MC, WL, AP ONLY): HCG, QUANTITATIVE: 26.5 m[IU]/mL — AB (ref <5)

## 2017-09-24 MED ORDER — MORPHINE SULFATE (PF) 4 MG/ML IV SOLN: 6.0000 mg | Freq: Once | INTRAVENOUS | Status: DC

## 2017-09-24 NOTE — ED Triage Notes (Signed)
Patient to ED c/o constant lower abd pain x 1 week that worsened today with 1 episode of N/V earlier today. Also endorsing R and L sided CP, intermittent and sharp in nature, starting about the same time as the abdominal pain. Also reports urgency ("I go but I still feel like I have to go when I'm done."), produces little urine. Peritoneal dialysis at home everyday. Denies fevers/chills. Resp e/u, skin warm/dry.

## 2017-09-24 NOTE — ED Notes (Signed)
Bladder scan showed 59ml of fluid.

## 2017-09-24 NOTE — ED Notes (Signed)
Patient transported to CT 

## 2017-09-25 DIAGNOSIS — N186 End stage renal disease: Secondary | ICD-10-CM | POA: Diagnosis not present

## 2017-09-25 DIAGNOSIS — D509 Iron deficiency anemia, unspecified: Secondary | ICD-10-CM | POA: Diagnosis not present

## 2017-09-25 DIAGNOSIS — R17 Unspecified jaundice: Secondary | ICD-10-CM | POA: Diagnosis not present

## 2017-09-25 DIAGNOSIS — E440 Moderate protein-calorie malnutrition: Secondary | ICD-10-CM | POA: Diagnosis not present

## 2017-09-25 DIAGNOSIS — D631 Anemia in chronic kidney disease: Secondary | ICD-10-CM | POA: Diagnosis not present

## 2017-09-25 DIAGNOSIS — N2581 Secondary hyperparathyroidism of renal origin: Secondary | ICD-10-CM | POA: Diagnosis not present

## 2017-09-25 MED ORDER — HYDROMORPHONE HCL 1 MG/ML IJ SOLN: 1.0000 mg | Freq: Once | INTRAMUSCULAR | Status: DC

## 2017-09-25 MED ORDER — AMOXICILLIN-POT CLAVULANATE 875-125 MG PO TABS: 1.0000 | ORAL_TABLET | Freq: Two times a day (BID) | ORAL | 0 refills | Status: DC

## 2017-09-25 MED ORDER — OXYCODONE-ACETAMINOPHEN 5-325 MG PO TABS: 1.0000 | ORAL_TABLET | Freq: Four times a day (QID) | ORAL | 0 refills | Status: DC | PRN

## 2017-09-25 NOTE — ED Provider Notes (Signed)
[facility] EMERGENCY DEPARTMENT Provider Note   CSN: [id number] Arrival date & time: 09/24/17  1526     History   Chief Complaint Chief Complaint  Patient presents with  . Abdominal Pain  . Chest Pain    HPI Terry Juarez is a 52 y.o. female.  HPI Patient presents to the emergency department with lower abdominal discomfort that started on Monday.  The patient states she is also had some diarrhea since that time intermittently along with 3 episodes of vomiting this morning.  Patient states she has had no fever.  The patient states she did not take any medications prior to arrival for symptoms.  The patient denies chest pain, shortness of breath, headache,blurred vision, neck pain, fever, cough, weakness, numbness, dizziness, anorexia, edema,rash, back pain, dysuria, hematemesis, bloody stool, near syncope, or syncope. Past Medical History:  Diagnosis Date  . Anemia   . Bruises easily   . Dialysis patient ([place])   . ESRD on dialysis ([place])   . Hyperlipidemia   . Hypertension   . Renal disorder    rt renal mass / < functioning of left kidney - being prepared for possible dialysis  . Right renal mass   . Stroke [facility])     Patient Active Problem List   Diagnosis Date Noted  . Left spastic hemiplegia ([place]) 08/06/2017  . Anemia due to chronic kidney disease, on chronic dialysis ([place]) 07/28/2017  . Hypertension secondary to other renal disorders 04/08/2017  . Left spastic hemiparesis ([place])   . Fall   . Slow transit constipation 01/28/2017  . Flaccid hemiplegia of left nondominant side as late effect of nontraumatic intraparenchymal hemorrhage of brain ([place])   . ESRD on dialysis ([place])   . Leukocytosis   . Hyperglycemia   . Hypertensive emergency 01/04/2017  . Acute left hemiparesis ([place]) 01/04/2017  . Intracranial hemorrhage ([place]) 01/04/2017  . Subarachnoid hematoma ([place])   . Hypothyroidism   . Benign essential HTN   . Seizure prophylaxis   . Cardiac  arrest, cause unspecified ([place])   . Acute respiratory failure with hypoxemia ([place])   . Acute encephalopathy   . ICH (intracerebral hemorrhage) ([place])   . Seizure ([place])   . PRES (posterior reversible encephalopathy syndrome)   . Subarachnoid hemorrhage 12/19/2016  . Symptomatic anemia 07/29/2016  . Cough 01/09/2016  . Elevated troponin 02/12/2015  . Headache 02/12/2015  . Syncope 02/12/2015  . ESRD on peritoneal dialysis ([place]) 02/12/2015  . Hypertension   . Hyperlipidemia   . Anemia   . Essential hypertension   . Cephalalgia   . Right renal mass 07/25/2014    Past Surgical History:  Procedure Laterality Date  . AV FISTULA PLACEMENT Right 07/11/2014   Procedure: ARTERIOVENOUS (AV) FISTULA CREATION RIGHT ARM BRACHIO-CEPHALIC WITH ATTEMPTED RADIO-CEPHALIC (AV) FISTULA;  Surgeon: Mal Misty, MD;  Location: [facility];  Service: Vascular;  Laterality: Right;  . COLONOSCOPY WITH PROPOFOL N/A 09/04/2016   Procedure: COLONOSCOPY WITH PROPOFOL;  Surgeon: Carol Ada, MD;  Location: [facility] ENDOSCOPY;  Service: Endoscopy;  Laterality: N/A;  . ECTOPIC PREGNANCY SURGERY  1987  . ESOPHAGOGASTRODUODENOSCOPY N/A 07/30/2016   Procedure: ESOPHAGOGASTRODUODENOSCOPY (EGD);  Surgeon: Carol Ada, MD;  Location: [facility] ENDOSCOPY;  Service: Endoscopy;  Laterality: N/A;  Bedside  . ESOPHAGOGASTRODUODENOSCOPY (EGD) WITH PROPOFOL N/A 09/04/2016   Procedure: ESOPHAGOGASTRODUODENOSCOPY (EGD) WITH PROPOFOL;  Surgeon: Carol Ada, MD;  Location: [facility] ENDOSCOPY;  Service: Endoscopy;  Laterality: N/A;  . ESOPHAGOGASTRODUODENOSCOPY (EGD) WITH PROPOFOL N/A 08/27/2017  Procedure: ESOPHAGOGASTRODUODENOSCOPY (EGD) WITH PROPOFOL;  Surgeon: Carol Ada, MD;  Location: [facility] ENDOSCOPY;  Service: Endoscopy;  Laterality: N/A;  . INSERTION OF DIALYSIS CATHETER Right 07/11/2014   Procedure: INSERTION OF DIALYSIS CATHETER IN RIGHT INTERNAL JUGULAR ;  Surgeon: Mal Misty, MD;  Location: [facility];  Service: Vascular;  Laterality: Right;  .  LAPAROSCOPIC NEPHRECTOMY Right 07/25/2014   Procedure: RIGHT LAPAROSCOPIC RADICAL NEPHRECTOMY ;  Surgeon: Ardis Hughs, MD;  Location: [facility];  Service: Urology;  Laterality: Right;  . PATCH ANGIOPLASTY Right 07/11/2014   Procedure: PATCH ANGIOPLASTY OF RIGHT RADIAL ARTERY USING CEPHALIC VEIN.;  Surgeon: Mal Misty, MD;  Location: [facility];  Service: Vascular;  Laterality: Right;  . THROMBECTOMY W/ EMBOLECTOMY Right 07/11/2014   Procedure: THROMBECTOMY OF RIGHT RADIAL ARTERY  ;  Surgeon: Mal Misty, MD;  Location: [facility];  Service: Vascular;  Laterality: Right;     OB History   None      Home Medications    Prior to Admission medications   Medication Sig Start Date End Date Taking? Authorizing Provider  acetaminophen (TYLENOL) 500 MG tablet Take 1,000 mg by mouth every 6 (six) hours as needed for moderate pain or headache.   Yes [provider]  amLODipine (NORVASC) 10 MG tablet Take 1 tablet (10 mg total) by mouth daily. Patient taking differently: Take 10 mg by mouth at bedtime.  02/01/17  Yes Love, Ivan Anchors, PA-C  aspirin EC 81 MG tablet Take 1 tablet (81 mg total) by mouth daily. 04/08/17  Yes Rosalin Hawking, MD  calcium acetate (PHOSLO) 667 MG capsule Take 2 capsules (1,334 mg total) by mouth 3 (three) times daily with meals. Patient taking differently: Take 1,334 mg by mouth See admin instructions. Take 2 capsules (1,334 mg) by mouth up to four times daily - with meals and snacks 02/01/17  Yes Love, Pamela S, PA-C  carvedilol (COREG) 12.5 MG tablet Take 25 mg by mouth 2 (two) times daily. 08/09/17  Yes [provider]  cinacalcet (SENSIPAR) 60 MG tablet Take 60 mg by mouth every Monday, Wednesday, and Friday.    Yes [provider]  famotidine (PEPCID) 20 MG tablet Take 1 tablet (20 mg total) by mouth daily. 02/01/17  Yes Love, Ivan Anchors, PA-C  FLUoxetine (PROZAC) 20 MG capsule Take 1 capsule (20 mg total) by mouth daily. 02/01/17  Yes Love, Ivan Anchors, PA-C    gentamicin cream (GARAMYCIN) 0.1 % Apply 1 application topically daily. Patient taking differently: Apply 1 application topically See admin instructions. Apply 1 application to dialysis site after every shower 02/01/17  Yes Love, Ivan Anchors, PA-C  levETIRAcetam (KEPPRA) 500 MG tablet Take 1 tablet (500 mg total) by mouth 2 (two) times daily. 02/01/17  Yes Love, Ivan Anchors, PA-C  levothyroxine (SYNTHROID, LEVOTHROID) 88 MCG tablet Take 1 tablet (88 mcg total) by mouth daily before breakfast. 02/01/17  Yes Love, Ivan Anchors, PA-C  multivitamin (RENA-VIT) TABS tablet Take 1 tablet by mouth daily.   Yes [provider]  pantoprazole (PROTONIX) 40 MG tablet Take 1 tablet (40 mg total) by mouth 2 (two) times daily. 02/01/17  Yes Love, Ivan Anchors, PA-C    Family History Family History  Problem Relation Age of Onset  . Throat cancer Mother        smoked  . Hypertension Father     Social History Social History   Tobacco Use  . Smoking status: Never Smoker  . Smokeless tobacco: Never Used  Substance Use Topics  . Alcohol use: Yes    Alcohol/week: 1.2 oz    Types: 2 Glasses of wine per week    Comment: occ  . Drug use: No     Allergies   Lisinopril   Review of Systems Review of Systems All other systems negative except as documented in the HPI. All pertinent positives and negatives as reviewed in the HPI.  Physical Exam Updated Vital Signs BP 140/86   Pulse 78   Temp 97.9 F (36.6 C) (Oral)   Resp 19   Ht 5\' 5"  (1.651 m)   Wt 52.2 kg (115 lb)   SpO2 96%   BMI 19.14 kg/m   Physical Exam  Constitutional: She is oriented to person, place, and time. She appears well-developed and well-nourished. No distress.  HENT:  Head: Normocephalic and atraumatic.  Mouth/Throat: Oropharynx is clear and moist.  Eyes: Pupils are equal, round, and reactive to light.  Neck: Normal range of motion. Neck supple.  Cardiovascular: Normal rate, regular rhythm and normal heart sounds. Exam reveals  no gallop and no friction rub.  No murmur heard. Pulmonary/Chest: Effort normal and breath sounds normal. No respiratory distress. She has no wheezes.  Abdominal: Soft. Bowel sounds are normal. She exhibits no distension. There is tenderness in the suprapubic area.  Neurological: She is alert and oriented to person, place, and time. She exhibits normal muscle tone. Coordination normal.  Skin: Skin is warm and dry. Capillary refill takes less than 2 seconds. No rash noted. No erythema.  Psychiatric: She has a normal mood and affect. Her behavior is normal.  Nursing note and vitals reviewed.    ED Treatments / Results  Labs (all labs ordered are listed, but only abnormal results are displayed) Labs Reviewed  LIPASE, BLOOD - Abnormal; Notable for the following components:      Result Value   Lipase 65 (*)    All other components within normal limits  COMPREHENSIVE METABOLIC PANEL - Abnormal; Notable for the following components:   Chloride 94 (*)    CO2 17 (*)    Glucose, Bld 169 (*)    BUN 103 (*)    Creatinine, Ser 16.21 (*)    Albumin 2.8 (*)    AST 51 (*)    ALT 72 (*)    GFR calc non Af Amer 2 (*)    GFR calc Af Amer 3 (*)    Anion gap 25 (*)    All other components within normal limits  CBC - Abnormal; Notable for the following components:   WBC 13.8 (*)    RBC 3.55 (*)    Hemoglobin 10.9 (*)    HCT 34.5 (*)    All other components within normal limits  HCG, QUANTITATIVE, PREGNANCY - Abnormal; Notable for the following components:   hCG, Beta Chain, Quant, S 7 (*)    All other components within normal limits  I-STAT BETA HCG BLOOD, ED ([facility], [facility], [facility] ONLY) - Abnormal; Notable for the following components:   I-stat hCG, quantitative 26.5 (*)    All other components within normal limits  URINALYSIS, ROUTINE W REFLEX MICROSCOPIC  I-STAT TROPONIN, ED  I-STAT BETA HCG BLOOD, ED ([facility], [facility], [facility] ONLY)    EKG EKG Interpretation  Date/Time:  Friday September 24 2017 15:30:03  EDT Ventricular Rate:  88 PR Interval:  140 QRS Duration: 90 QT Interval:  334 QTC Calculation: 404 R Axis:   80 Text Interpretation:  Normal sinus rhythm Possible Left  atrial enlargement Borderline ECG No significant change since last tracing Confirmed by Wandra Arthurs [phone]) on 09/24/2017 10:54:02 PM   Radiology Ct Abdomen Pelvis Wo Contrast  Result Date: 09/24/2017 CLINICAL DATA:  Lower abdominal pain for 1 week EXAM: CT ABDOMEN AND PELVIS WITHOUT CONTRAST TECHNIQUE: Multidetector CT imaging of the abdomen and pelvis was performed following the standard protocol without IV contrast. COMPARISON:  07/29/2016, 07/08/2015 FINDINGS: Lower chest: Lung bases demonstrate no acute consolidation or effusion. Linear atelectasis or scar in the anterior left lung base. Heart size upper normal. Hepatobiliary: No focal liver abnormality is seen. No gallstones, gallbladder wall thickening, or biliary dilatation. Pancreas: Unremarkable. No pancreatic ductal dilatation or surrounding inflammatory changes. Spleen: Normal in size without focal abnormality. Adrenals/Urinary Tract: Adrenal glands are within normal limits. Status post right nephrectomy. Hyperdense nodule mid left kidney decreased in size. Additional cortical hypodensities left kidney. No hydronephrosis. Bladder normal. Stomach/Bowel: Stomach nonenlarged. No dilated small bowel. Normal appendix. Possible mild wall thickening involving the descending colon. Vascular/Lymphatic: Moderate aortic atherosclerosis. No aneurysmal dilatation. No significantly enlarged lymph nodes. Reproductive: Uterus and bilateral adnexa are unremarkable. Other: Trace free fluid in the pelvis. Multiple locules of intraperitoneal air mostly in the upper abdomen. Peritoneal dialysis catheter in the right abdomen, tip is coiled in the right pelvis. Musculoskeletal: No acute or significant osseous findings. IMPRESSION: 1. Possible mild wall thickening involving the descending colon,  query mild colitis. Otherwise no definite acute abnormalities are seen 2. Small amount of pneumoperitoneum with peritoneal dialysis catheter in place 3. Trace free fluid in the pelvis 4. Status post right nephrectomy Electronically Signed   By: Donavan Foil M.D.   On: 09/24/2017 23:15   Dg Chest 2 View  Result Date: 09/24/2017 CLINICAL DATA:  Lower abdominal pain 1 week worse today with episode of nausea and vomiting. Also intermittent chest pain. EXAM: CHEST - 2 VIEW COMPARISON:  12/25/2016 as well as abdominal film 08/02/2017 FINDINGS: Lungs are adequately inflated without focal consolidation or effusion. Possible nodule opacity over the left upper lobe projected in the region of the anterior left second rib. Possible small nodule opacity over the left base. Cardiomediastinal silhouette is within normal. There is calcified plaque over the aortic arch. Possible small amount of free subdiaphragmatic air bilaterally. IMPRESSION: No acute cardiopulmonary disease. Possible nodule opacities over the left upper lobe and left base. Recommend noncontrast chest CT for further evaluation. Possible small amount of free subdiaphragmatic air. This is likely related patient's peritoneal dialysis as recommend clinical correlation. Electronically Signed   By: Marin Olp M.D.   On: 09/24/2017 16:07    Procedures Procedures (including critical care time)  Medications Ordered in ED Medications  HYDROmorphone (DILAUDID) injection 1 mg (has no administration in time range)     Initial Impression / Assessment and Plan / ED Course  I have reviewed the triage vital signs and the nursing notes.  Pertinent labs & imaging results that were available during my care of the patient were reviewed by me and considered in my medical decision making (see chart for details).  Clinical Course as of Mar 23 0002  Fri Sep 24, 2017  1903 DG Chest 2 View [AH]    Clinical Course User Index [AH] [place], [place]     Patient will be treated for the inflammation of the colon noted.  The patient is advised to follow-up with her primary doctor.  Told to return here for any worsening in her condition.  I do not feel  that SBP is a consideration due to the fact that she states that her fluid is crystal clear along with the fact she has no fever.  Patient agrees the plan and all questions were answered.  Final Clinical Impressions(s) / ED Diagnoses   Final diagnoses:  None    ED Discharge Orders    None       Dalia Heading, PA-C 09/25/17 0038    Drenda Freeze, MD 09/28/17 1046

## 2017-09-25 NOTE — Discharge Instructions (Addendum)
Return here as needed. follow-up with your primary doctor.

## 2017-09-25 NOTE — ED Notes (Signed)
The pt does not want an iv or pain meds

## 2017-09-26 DIAGNOSIS — D509 Iron deficiency anemia, unspecified: Secondary | ICD-10-CM | POA: Diagnosis not present

## 2017-09-26 DIAGNOSIS — N186 End stage renal disease: Secondary | ICD-10-CM | POA: Diagnosis not present

## 2017-09-26 DIAGNOSIS — R17 Unspecified jaundice: Secondary | ICD-10-CM | POA: Diagnosis not present

## 2017-09-26 DIAGNOSIS — N2581 Secondary hyperparathyroidism of renal origin: Secondary | ICD-10-CM | POA: Diagnosis not present

## 2017-09-26 DIAGNOSIS — E440 Moderate protein-calorie malnutrition: Secondary | ICD-10-CM | POA: Diagnosis not present

## 2017-09-26 DIAGNOSIS — D631 Anemia in chronic kidney disease: Secondary | ICD-10-CM | POA: Diagnosis not present

## 2017-09-27 DIAGNOSIS — N2581 Secondary hyperparathyroidism of renal origin: Secondary | ICD-10-CM | POA: Diagnosis not present

## 2017-09-27 DIAGNOSIS — D631 Anemia in chronic kidney disease: Secondary | ICD-10-CM | POA: Diagnosis not present

## 2017-09-27 DIAGNOSIS — N186 End stage renal disease: Secondary | ICD-10-CM | POA: Diagnosis not present

## 2017-09-27 DIAGNOSIS — R17 Unspecified jaundice: Secondary | ICD-10-CM | POA: Diagnosis not present

## 2017-09-27 DIAGNOSIS — D509 Iron deficiency anemia, unspecified: Secondary | ICD-10-CM | POA: Diagnosis not present

## 2017-09-27 DIAGNOSIS — E440 Moderate protein-calorie malnutrition: Secondary | ICD-10-CM | POA: Diagnosis not present

## 2017-09-28 DIAGNOSIS — E440 Moderate protein-calorie malnutrition: Secondary | ICD-10-CM | POA: Diagnosis not present

## 2017-09-28 DIAGNOSIS — R17 Unspecified jaundice: Secondary | ICD-10-CM | POA: Diagnosis not present

## 2017-09-28 DIAGNOSIS — N186 End stage renal disease: Secondary | ICD-10-CM | POA: Diagnosis not present

## 2017-09-28 DIAGNOSIS — D631 Anemia in chronic kidney disease: Secondary | ICD-10-CM | POA: Diagnosis not present

## 2017-09-28 DIAGNOSIS — N2581 Secondary hyperparathyroidism of renal origin: Secondary | ICD-10-CM | POA: Diagnosis not present

## 2017-09-28 DIAGNOSIS — D509 Iron deficiency anemia, unspecified: Secondary | ICD-10-CM | POA: Diagnosis not present

## 2017-09-29 DIAGNOSIS — E440 Moderate protein-calorie malnutrition: Secondary | ICD-10-CM | POA: Diagnosis not present

## 2017-09-29 DIAGNOSIS — N2581 Secondary hyperparathyroidism of renal origin: Secondary | ICD-10-CM | POA: Diagnosis not present

## 2017-09-29 DIAGNOSIS — D509 Iron deficiency anemia, unspecified: Secondary | ICD-10-CM | POA: Diagnosis not present

## 2017-09-29 DIAGNOSIS — R17 Unspecified jaundice: Secondary | ICD-10-CM | POA: Diagnosis not present

## 2017-09-29 DIAGNOSIS — D631 Anemia in chronic kidney disease: Secondary | ICD-10-CM | POA: Diagnosis not present

## 2017-09-29 DIAGNOSIS — N186 End stage renal disease: Secondary | ICD-10-CM | POA: Diagnosis not present

## 2017-09-30 DIAGNOSIS — N2581 Secondary hyperparathyroidism of renal origin: Secondary | ICD-10-CM | POA: Diagnosis not present

## 2017-09-30 DIAGNOSIS — E440 Moderate protein-calorie malnutrition: Secondary | ICD-10-CM | POA: Diagnosis not present

## 2017-09-30 DIAGNOSIS — D509 Iron deficiency anemia, unspecified: Secondary | ICD-10-CM | POA: Diagnosis not present

## 2017-09-30 DIAGNOSIS — D631 Anemia in chronic kidney disease: Secondary | ICD-10-CM | POA: Diagnosis not present

## 2017-09-30 DIAGNOSIS — R17 Unspecified jaundice: Secondary | ICD-10-CM | POA: Diagnosis not present

## 2017-09-30 DIAGNOSIS — N186 End stage renal disease: Secondary | ICD-10-CM | POA: Diagnosis not present

## 2017-10-01 DIAGNOSIS — N2581 Secondary hyperparathyroidism of renal origin: Secondary | ICD-10-CM | POA: Diagnosis not present

## 2017-10-01 DIAGNOSIS — R17 Unspecified jaundice: Secondary | ICD-10-CM | POA: Diagnosis not present

## 2017-10-01 DIAGNOSIS — E440 Moderate protein-calorie malnutrition: Secondary | ICD-10-CM | POA: Diagnosis not present

## 2017-10-01 DIAGNOSIS — N186 End stage renal disease: Secondary | ICD-10-CM | POA: Diagnosis not present

## 2017-10-01 DIAGNOSIS — D631 Anemia in chronic kidney disease: Secondary | ICD-10-CM | POA: Diagnosis not present

## 2017-10-01 DIAGNOSIS — D509 Iron deficiency anemia, unspecified: Secondary | ICD-10-CM | POA: Diagnosis not present

## 2017-10-02 DIAGNOSIS — D631 Anemia in chronic kidney disease: Secondary | ICD-10-CM | POA: Diagnosis not present

## 2017-10-02 DIAGNOSIS — R17 Unspecified jaundice: Secondary | ICD-10-CM | POA: Diagnosis not present

## 2017-10-02 DIAGNOSIS — D509 Iron deficiency anemia, unspecified: Secondary | ICD-10-CM | POA: Diagnosis not present

## 2017-10-02 DIAGNOSIS — N2581 Secondary hyperparathyroidism of renal origin: Secondary | ICD-10-CM | POA: Diagnosis not present

## 2017-10-02 DIAGNOSIS — E440 Moderate protein-calorie malnutrition: Secondary | ICD-10-CM | POA: Diagnosis not present

## 2017-10-02 DIAGNOSIS — N186 End stage renal disease: Secondary | ICD-10-CM | POA: Diagnosis not present

## 2017-10-03 ENCOUNTER — Emergency Department (HOSPITAL_COMMUNITY)
Admission: EM | Admit: 2017-10-03 | Discharge: 2017-10-03 | Disposition: A | Discharge status: Home / Self Care | Payer: Medicare Other | Attending: Emergency Medicine | Admitting: Emergency Medicine

## 2017-10-03 ENCOUNTER — Encounter (HOSPITAL_COMMUNITY): Payer: Self-pay | Admitting: Emergency Medicine

## 2017-10-03 ENCOUNTER — Emergency Department (HOSPITAL_COMMUNITY): Payer: Medicare Other

## 2017-10-03 DIAGNOSIS — E039 Hypothyroidism, unspecified: Secondary | ICD-10-CM | POA: Insufficient documentation

## 2017-10-03 DIAGNOSIS — Z79899 Other long term (current) drug therapy: Secondary | ICD-10-CM | POA: Insufficient documentation

## 2017-10-03 DIAGNOSIS — T8571XA Infection and inflammatory reaction due to peritoneal dialysis catheter, initial encounter: Secondary | ICD-10-CM | POA: Diagnosis not present

## 2017-10-03 DIAGNOSIS — R109 Unspecified abdominal pain: Secondary | ICD-10-CM | POA: Diagnosis present

## 2017-10-03 DIAGNOSIS — E440 Moderate protein-calorie malnutrition: Secondary | ICD-10-CM | POA: Diagnosis not present

## 2017-10-03 DIAGNOSIS — Z992 Dependence on renal dialysis: Secondary | ICD-10-CM | POA: Diagnosis not present

## 2017-10-03 DIAGNOSIS — N186 End stage renal disease: Secondary | ICD-10-CM | POA: Insufficient documentation

## 2017-10-03 DIAGNOSIS — Y69 Unspecified misadventure during surgical and medical care: Secondary | ICD-10-CM | POA: Insufficient documentation

## 2017-10-03 DIAGNOSIS — R1030 Lower abdominal pain, unspecified: Secondary | ICD-10-CM | POA: Diagnosis not present

## 2017-10-03 DIAGNOSIS — R17 Unspecified jaundice: Secondary | ICD-10-CM | POA: Diagnosis not present

## 2017-10-03 DIAGNOSIS — Z8673 Personal history of transient ischemic attack (TIA), and cerebral infarction without residual deficits: Secondary | ICD-10-CM | POA: Insufficient documentation

## 2017-10-03 DIAGNOSIS — D509 Iron deficiency anemia, unspecified: Secondary | ICD-10-CM | POA: Diagnosis not present

## 2017-10-03 DIAGNOSIS — Z7982 Long term (current) use of aspirin: Secondary | ICD-10-CM | POA: Insufficient documentation

## 2017-10-03 DIAGNOSIS — K659 Peritonitis, unspecified: Secondary | ICD-10-CM | POA: Diagnosis not present

## 2017-10-03 DIAGNOSIS — N2581 Secondary hyperparathyroidism of renal origin: Secondary | ICD-10-CM | POA: Diagnosis not present

## 2017-10-03 DIAGNOSIS — I120 Hypertensive chronic kidney disease with stage 5 chronic kidney disease or end stage renal disease: Secondary | ICD-10-CM | POA: Insufficient documentation

## 2017-10-03 DIAGNOSIS — D631 Anemia in chronic kidney disease: Secondary | ICD-10-CM | POA: Diagnosis not present

## 2017-10-03 LAB — CBC
HEMATOCRIT: 31.9 % — AB (ref 36.0–46.0)
HEMOGLOBIN: 9.8 g/dL — AB (ref 12.0–15.0)
MCH: 30.7 pg (ref 26.0–34.0)
MCHC: 30.7 g/dL (ref 30.0–36.0)
MCV: 100 fL (ref 78.0–100.0)
Platelets: 420 10*3/uL — ABNORMAL HIGH (ref 150–400)
RBC: 3.19 MIL/uL — ABNORMAL LOW (ref 3.87–5.11)
RDW: 13.4 % (ref 11.5–15.5)
WBC: 15.2 10*3/uL — AB (ref 4.0–10.5)

## 2017-10-03 LAB — BODY FLUID CELL COUNT WITH DIFFERENTIAL
EOS FL: 3 %
Lymphs, Fluid: 4 %
Monocyte-Macrophage-Serous Fluid: 14 % — ABNORMAL LOW (ref 50–90)
NEUTROPHIL FLUID: 79 % — AB (ref 0–25)
Total Nucleated Cell Count, Fluid: UNDETERMINED cu mm (ref 0–1000)
WBC FLUID: 16560 uL — AB (ref 0–1000)

## 2017-10-03 LAB — I-STAT BETA HCG BLOOD, ED (MC, WL, AP ONLY): I-stat hCG, quantitative: 22.8 m[IU]/mL — ABNORMAL HIGH (ref <5)

## 2017-10-03 LAB — COMPREHENSIVE METABOLIC PANEL
ALBUMIN: 2.7 g/dL — AB (ref 3.5–5.0)
ALT: 16 U/L (ref 14–54)
ANION GAP: 19 — AB (ref 5–15)
AST: 16 U/L (ref 15–41)
Alkaline Phosphatase: 50 U/L (ref 38–126)
BILIRUBIN TOTAL: 0.4 mg/dL (ref 0.3–1.2)
BUN: 55 mg/dL — ABNORMAL HIGH (ref 6–20)
CHLORIDE: 96 mmol/L — AB (ref 101–111)
CO2: 24 mmol/L (ref 22–32)
Calcium: 8.8 mg/dL — ABNORMAL LOW (ref 8.9–10.3)
Creatinine, Ser: 11.91 mg/dL — ABNORMAL HIGH (ref 0.44–1.00)
GFR calc Af Amer: 4 mL/min — ABNORMAL LOW (ref >60)
GFR calc non Af Amer: 3 mL/min — ABNORMAL LOW (ref >60)
Glucose, Bld: 93 mg/dL (ref 65–99)
POTASSIUM: 3.7 mmol/L (ref 3.5–5.1)
Sodium: 139 mmol/L (ref 135–145)
Total Protein: 6.9 g/dL (ref 6.5–8.1)

## 2017-10-03 LAB — LIPASE, BLOOD: LIPASE: 41 U/L (ref 11–51)

## 2017-10-03 LAB — I-STAT CG4 LACTIC ACID, ED: LACTIC ACID, VENOUS: 1.07 mmol/L (ref 0.5–1.9)

## 2017-10-03 MED ORDER — OXYCODONE-ACETAMINOPHEN 5-325 MG PO TABS
2.0000 | ORAL_TABLET | Freq: Once | ORAL | Status: AC
  Administered 2017-10-03: 2 via ORAL
  Filled 2017-10-03: qty 2

## 2017-10-03 MED ORDER — MORPHINE SULFATE (PF) 4 MG/ML IV SOLN
4.0000 mg | Freq: Once | INTRAVENOUS | Status: AC
  Administered 2017-10-03: 4 mg via INTRAVENOUS
  Filled 2017-10-03: qty 1

## 2017-10-03 MED ORDER — IOPAMIDOL (ISOVUE-300) INJECTION 61%
INTRAVENOUS | Status: AC
  Filled 2017-10-03: qty 30

## 2017-10-03 MED ORDER — SODIUM CHLORIDE 0.9 % IV SOLN
1.0000 g | Freq: Once | INTRAVENOUS | Status: AC
  Administered 2017-10-03: 1 g via INTRAVENOUS
  Filled 2017-10-03: qty 1

## 2017-10-03 MED ORDER — SODIUM CHLORIDE 0.9 % IV SOLN
1500.0000 mg | Freq: Once | INTRAVENOUS | Status: AC
  Administered 2017-10-03: 1500 mg via INTRAVENOUS
  Filled 2017-10-03: qty 1500

## 2017-10-03 MED ORDER — OXYCODONE-ACETAMINOPHEN 5-325 MG PO TABS: 1.0000 | ORAL_TABLET | Freq: Four times a day (QID) | ORAL | 0 refills | Status: DC | PRN

## 2017-10-03 NOTE — Discharge Instructions (Signed)
Take percocet as needed for pain.   Your dialysis nurse will contact you regarding infusing antibiotics into the peritoneal catheter. She will direct you regarding when the fluid will need to be sampled.   See your primary care doctor and your nephrologist this week   Return to ER if you have worse abdominal pain, vomiting, fever./

## 2017-10-03 NOTE — ED Provider Notes (Signed)
[facility] DEPT Provider Note   CSN: [id number] Arrival date & time: 10/03/17  1338     History   Chief Complaint Chief Complaint  Patient presents with  . Abdominal Pain  . Emesis  . Nausea    HPI Terry Juarez is a 52 y.o. female history of hypertension, hyperlipidemia, previous stroke with left-sided weakness, ESRD on peritoneal dialysis here presenting with abdominal pain, nausea, vomiting.  Patient states that she has been having intermittent abdominal pain for the last 3 weeks.  Patient states that is worse after she eats and is associated with some nausea and vomiting.  Denies any fever or chills.  Patient does get peritoneal dialysis daily and the dialysis fluid has been clear.  Patient was seen in the ED about a week ago and was diagnosed with colitis on CT abdomen pelvis and started on Augmentin and oxycodone.  She states that her pain has not improved.   The history is provided by the patient.    Past Medical History:  Diagnosis Date  . Anemia   . Bruises easily   . Dialysis patient ([place])   . ESRD on dialysis ([place])   . Hyperlipidemia   . Hypertension   . Renal disorder    rt renal mass / < functioning of left kidney - being prepared for possible dialysis  . Right renal mass   . Stroke [facility])     Patient Active Problem List   Diagnosis Date Noted  . Left spastic hemiplegia ([place]) 08/06/2017  . Anemia due to chronic kidney disease, on chronic dialysis ([place]) 07/28/2017  . Hypertension secondary to other renal disorders 04/08/2017  . Left spastic hemiparesis ([place])   . Fall   . Slow transit constipation 01/28/2017  . Flaccid hemiplegia of left nondominant side as late effect of nontraumatic intraparenchymal hemorrhage of brain ([place])   . ESRD on dialysis ([place])   . Leukocytosis   . Hyperglycemia   . Hypertensive emergency 01/04/2017  . Acute left hemiparesis ([place]) 01/04/2017  . Intracranial hemorrhage ([place]) 01/04/2017  .  Subarachnoid hematoma ([place])   . Hypothyroidism   . Benign essential HTN   . Seizure prophylaxis   . Cardiac arrest, cause unspecified ([place])   . Acute respiratory failure with hypoxemia ([place])   . Acute encephalopathy   . ICH (intracerebral hemorrhage) ([place])   . Seizure ([place])   . PRES (posterior reversible encephalopathy syndrome)   . Subarachnoid hemorrhage 12/19/2016  . Symptomatic anemia 07/29/2016  . Cough 01/09/2016  . Elevated troponin 02/12/2015  . Headache 02/12/2015  . Syncope 02/12/2015  . ESRD on peritoneal dialysis ([place]) 02/12/2015  . Hypertension   . Hyperlipidemia   . Anemia   . Essential hypertension   . Cephalalgia   . Right renal mass 07/25/2014    Past Surgical History:  Procedure Laterality Date  . AV FISTULA PLACEMENT Right 07/11/2014   Procedure: ARTERIOVENOUS (AV) FISTULA CREATION RIGHT ARM BRACHIO-CEPHALIC WITH ATTEMPTED RADIO-CEPHALIC (AV) FISTULA;  Surgeon: Mal Misty, MD;  Location: [facility];  Service: Vascular;  Laterality: Right;  . COLONOSCOPY WITH PROPOFOL N/A 09/04/2016   Procedure: COLONOSCOPY WITH PROPOFOL;  Surgeon: Carol Ada, MD;  Location: [facility];  Service: Endoscopy;  Laterality: N/A;  . ECTOPIC PREGNANCY SURGERY  1987  . ESOPHAGOGASTRODUODENOSCOPY N/A 07/30/2016   Procedure: ESOPHAGOGASTRODUODENOSCOPY (EGD);  Surgeon: Carol Ada, MD;  Location: [facility] ENDOSCOPY;  Service: Endoscopy;  Laterality: N/A;  Bedside  . ESOPHAGOGASTRODUODENOSCOPY (EGD) WITH PROPOFOL N/A 09/04/2016  Procedure: ESOPHAGOGASTRODUODENOSCOPY (EGD) WITH PROPOFOL;  Surgeon: Carol Ada, MD;  Location: [facility];  Service: Endoscopy;  Laterality: N/A;  . ESOPHAGOGASTRODUODENOSCOPY (EGD) WITH PROPOFOL N/A 08/27/2017   Procedure: ESOPHAGOGASTRODUODENOSCOPY (EGD) WITH PROPOFOL;  Surgeon: Carol Ada, MD;  Location: [facility];  Service: Endoscopy;  Laterality: N/A;  . INSERTION OF DIALYSIS CATHETER Right 07/11/2014   Procedure: INSERTION OF DIALYSIS CATHETER IN RIGHT  INTERNAL JUGULAR ;  Surgeon: Mal Misty, MD;  Location: [facility];  Service: Vascular;  Laterality: Right;  . LAPAROSCOPIC NEPHRECTOMY Right 07/25/2014   Procedure: RIGHT LAPAROSCOPIC RADICAL NEPHRECTOMY ;  Surgeon: Ardis Hughs, MD;  Location: [facility];  Service: Urology;  Laterality: Right;  . PATCH ANGIOPLASTY Right 07/11/2014   Procedure: PATCH ANGIOPLASTY OF RIGHT RADIAL ARTERY USING CEPHALIC VEIN.;  Surgeon: Mal Misty, MD;  Location: [facility];  Service: Vascular;  Laterality: Right;  . THROMBECTOMY W/ EMBOLECTOMY Right 07/11/2014   Procedure: THROMBECTOMY OF RIGHT RADIAL ARTERY  ;  Surgeon: Mal Misty, MD;  Location: [facility];  Service: Vascular;  Laterality: Right;     OB History   None      Home Medications    Prior to Admission medications   Medication Sig Start Date End Date Taking? Authorizing Provider  acetaminophen (TYLENOL) 500 MG tablet Take 1,000 mg by mouth every 6 (six) hours as needed for moderate pain or headache.   Yes [provider]  amLODipine (NORVASC) 10 MG tablet Take 1 tablet (10 mg total) by mouth daily. Patient taking differently: Take 10 mg by mouth at bedtime.  02/01/17  Yes Love, Ivan Anchors, PA-C  amoxicillin-clavulanate (AUGMENTIN) 875-125 MG tablet Take 1 tablet by mouth 2 (two) times daily. 09/25/17  Yes Lawyer, Harrell Gave, PA-C  aspirin EC 81 MG tablet Take 1 tablet (81 mg total) by mouth daily. 04/08/17  Yes Rosalin Hawking, MD  calcium acetate (PHOSLO) 667 MG capsule Take 2 capsules (1,334 mg total) by mouth 3 (three) times daily with meals. Patient taking differently: Take 1,334 mg by mouth See admin instructions. Take 2 capsules (1,334 mg) by mouth up to four times daily - with meals and snacks 02/01/17  Yes Love, Pamela S, PA-C  carvedilol (COREG) 12.5 MG tablet Take 25 mg by mouth 2 (two) times daily. 08/09/17  Yes [provider]  cinacalcet (SENSIPAR) 60 MG tablet Take 60 mg by mouth every Monday, Wednesday, and Friday.    Yes [provider]  famotidine (PEPCID) 20 MG tablet Take 1 tablet (20 mg total) by mouth daily. 02/01/17  Yes Love, Ivan Anchors, PA-C  FLUoxetine (PROZAC) 20 MG capsule Take 1 capsule (20 mg total) by mouth daily. 02/01/17  Yes Love, Ivan Anchors, PA-C  gentamicin cream (GARAMYCIN) 0.1 % Apply 1 application topically daily. 02/01/17  Yes Love, Ivan Anchors, PA-C  levETIRAcetam (KEPPRA) 500 MG tablet Take 1 tablet (500 mg total) by mouth 2 (two) times daily. 02/01/17  Yes Love, Ivan Anchors, PA-C  levothyroxine (SYNTHROID, LEVOTHROID) 88 MCG tablet Take 1 tablet (88 mcg total) by mouth daily before breakfast. 02/01/17  Yes Love, Ivan Anchors, PA-C  multivitamin (RENA-VIT) TABS tablet Take 1 tablet by mouth daily.   Yes [provider]  oxyCODONE-acetaminophen (PERCOCET/ROXICET) 5-325 MG tablet Take 1 tablet by mouth every 6 (six) hours as needed for severe pain. 09/25/17  Yes Lawyer, Harrell Gave, PA-C  pantoprazole (PROTONIX) 40 MG tablet Take 1 tablet (40 mg total) by mouth 2 (two) times daily. 02/01/17  Yes Reesa Chew  S, PA-C    Family History Family History  Problem Relation Age of Onset  . Throat cancer Mother        smoked  . Hypertension Father     Social History Social History   Tobacco Use  . Smoking status: Never Smoker  . Smokeless tobacco: Never Used  Substance Use Topics  . Alcohol use: Yes    Alcohol/week: 1.2 oz    Types: 2 Glasses of wine per week    Comment: occ  . Drug use: No     Allergies   Lisinopril   Review of Systems Review of Systems  Gastrointestinal: Positive for abdominal pain and vomiting.  All other systems reviewed and are negative.    Physical Exam Updated Vital Signs BP (!) 155/99 (BP Location: Left Arm)   Pulse 91   Temp 99.7 F (37.6 C) (Rectal)   Resp 16   SpO2 98%   Physical Exam  Constitutional: She is oriented to person, place, and time.  Chronically ill   HENT:  Head: Normocephalic and atraumatic.  Mouth/Throat: Oropharynx is clear and  moist.  Eyes: Pupils are equal, round, and reactive to light.  Cardiovascular: Normal rate.  Pulmonary/Chest: Effort normal.  Abdominal: Bowel sounds are normal.  Peritoneal dialysis catheter in place with no erythema surrounding it. There is clear fluid in the catheter. Mild diffuse lower abdominal tenderness   Neurological: She is alert and oriented to person, place, and time.  Skin: Skin is warm.  Psychiatric: She has a normal mood and affect.  Nursing note and vitals reviewed.    ED Treatments / Results  Labs (all labs ordered are listed, but only abnormal results are displayed) Labs Reviewed  COMPREHENSIVE METABOLIC PANEL - Abnormal; Notable for the following components:      Result Value   Chloride 96 (*)    BUN 55 (*)    Creatinine, Ser 11.91 (*)    Calcium 8.8 (*)    Albumin 2.7 (*)    GFR calc non Af Amer 3 (*)    GFR calc Af Amer 4 (*)    Anion gap 19 (*)    All other components within normal limits  CBC - Abnormal; Notable for the following components:   WBC 15.2 (*)    RBC 3.19 (*)    Hemoglobin 9.8 (*)    HCT 31.9 (*)    Platelets 420 (*)    All other components within normal limits  BODY FLUID CELL COUNT WITH DIFFERENTIAL - Abnormal; Notable for the following components:   Color, Fluid COLORLESS (*)    Appearance, Fluid CLOUDY (*)    Neutrophil Count, Fluid 79 (*)    Monocyte-Macrophage-Serous Fluid 14 (*)    All other components within normal limits  BODY FLUID CELL COUNT WITH DIFFERENTIAL - Abnormal; Notable for the following components:   Color, Fluid YELLOW (*)    Appearance, Fluid TURBID (*)    WBC, Fluid 16,560 (*)    All other components within normal limits  I-STAT BETA HCG BLOOD, ED ([facility], [facility], [facility] ONLY) - Abnormal; Notable for the following components:   I-stat hCG, quantitative 22.8 (*)    All other components within normal limits  GRAM STAIN  CULTURE, BLOOD (ROUTINE X 2)  CULTURE, BLOOD (ROUTINE X 2)  BODY FLUID CULTURE  LIPASE, BLOOD    URINALYSIS, ROUTINE W REFLEX MICROSCOPIC  GLUCOSE, BODY FLUID OTHER  PATHOLOGIST SMEAR REVIEW  BODY FLUID CELL COUNT WITH DIFFERENTIAL  I-STAT CG4 LACTIC  ACID, ED  I-STAT CG4 LACTIC ACID, ED    EKG None  Radiology Ct Abdomen Pelvis Wo Contrast  Result Date: 10/03/2017 CLINICAL DATA:  Lower abdominal pain for 3 weeks. EXAM: CT ABDOMEN AND PELVIS WITHOUT CONTRAST TECHNIQUE: Multidetector CT imaging of the abdomen and pelvis was performed following the standard protocol without IV contrast. COMPARISON:  CT scan of September 24, 2017. FINDINGS: Lower chest: No acute abnormality. Hepatobiliary: No focal liver abnormality is seen. No gallstones, gallbladder wall thickening, or biliary dilatation. Pancreas: Unremarkable. No pancreatic ductal dilatation or surrounding inflammatory changes. Spleen: Normal in size without focal abnormality. Adrenals/Urinary Tract: Status post right nephrectomy. Left renal atrophy is noted consistent with end-stage renal disease. No hydronephrosis or renal obstruction is noted. Urinary bladder is decompressed. Stomach/Bowel: Stomach is within normal limits. Appendix appears normal. No evidence of bowel wall thickening, distention, or inflammatory changes. Vascular/Lymphatic: Aortic atherosclerosis. No enlarged abdominal or pelvic lymph nodes. Reproductive: Uterus and bilateral adnexa are unremarkable. Other: Distal tip of peritoneal dialysis catheter is seen in the pelvis anteriorly. Mild epigastric pneumoperitoneum is noted most consistent with peritoneal dialysis. Minimal free fluid is noted in the pelvis. Musculoskeletal: No acute or significant osseous findings. IMPRESSION: Mild epigastric pneumoperitoneum is again noted most consistent with history of peritoneal dialysis. Status post right nephrectomy. No acute abnormality seen in the abdomen or pelvis. Aortic Atherosclerosis (ICD10-I70.0). Electronically Signed   By: Marijo Conception, M.D.   On: 10/03/2017 18:45     Procedures Procedures (including critical care time)  Medications Ordered in ED Medications  iopamidol (ISOVUE-300) 61 % injection (has no administration in time range)  vancomycin (VANCOCIN) 1,500 mg in sodium chloride 0.9 % 500 mL IVPB (1,500 mg Intravenous New Bag/Given 10/03/17 2037)  morphine 4 MG/ML injection 4 mg (4 mg Intravenous Given 10/03/17 1735)  cefTAZidime (FORTAZ) 1 g in sodium chloride 0.9 % 100 mL IVPB (1 g Intravenous New Bag/Given 10/03/17 2037)     Initial Impression / Assessment and Plan / ED Course  I have reviewed the triage vital signs and the nursing notes.  Pertinent labs & imaging results that were available during my care of the patient were reviewed by me and considered in my medical decision making (see chart for details).     EARLEEN AOUN is a 52 y.o. female here with abdominal pain. Patient on peritoneal dialysis and is on augmentin for colitis recently diagnosed on CT. Afebrile in the ED. Consider persistent colitis vs abscess vs SBP. Will get labs, lactate, cultures. Will repeat CT ab/pel and send off peritoneal fluid.   8 pm Initial peritoneal fluid clotted so unable to obtain WBC count. Second sample showed total WBC of 16,500. Mostly neutrophils. No organisms on gram stain. WBC up to 15 on CBC. Afebrile in the ED. CT showed no obvious colitis now. Lactate nl. I called Dr. Posey Pronto from nephrology. He recommend IV vanc, fortaz. He will call her dialysis nurse and will do intra peritoneal vancomycin and will repeat peritoneal analysis in several days. I discussed with him and with patient regarding admission. He states that this can be managed outpatient and he will follow up cultures. Patient is agreeable.     Final Clinical Impressions(s) / ED Diagnoses   Final diagnoses:  None    ED Discharge Orders    None       Terry Freeze, MD 10/03/17 2152

## 2017-10-03 NOTE — ED Notes (Signed)
1 unsuccessful IV attempt by this RN.  Will ask staff to attempt

## 2017-10-03 NOTE — ED Notes (Signed)
Pt is a hard stick.  Unable to obtain a second set of blood cultures.  Dr. Darl Householder aware.

## 2017-10-03 NOTE — ED Triage Notes (Signed)
Patient here from home with complaints of lower abdominal pain x3 weeks, nausea, vomiting. Reports being seen at [facility] and prescribed antibiotics and pain medicine with no relief. Peritoneal dialysis at home.

## 2017-10-04 DIAGNOSIS — R82998 Other abnormal findings in urine: Secondary | ICD-10-CM | POA: Diagnosis not present

## 2017-10-04 DIAGNOSIS — E890 Postprocedural hypothyroidism: Secondary | ICD-10-CM | POA: Diagnosis not present

## 2017-10-04 DIAGNOSIS — E440 Moderate protein-calorie malnutrition: Secondary | ICD-10-CM | POA: Diagnosis not present

## 2017-10-04 DIAGNOSIS — N2581 Secondary hyperparathyroidism of renal origin: Secondary | ICD-10-CM | POA: Diagnosis not present

## 2017-10-04 DIAGNOSIS — K659 Peritonitis, unspecified: Secondary | ICD-10-CM | POA: Diagnosis not present

## 2017-10-04 DIAGNOSIS — E7849 Other hyperlipidemia: Secondary | ICD-10-CM | POA: Diagnosis not present

## 2017-10-04 DIAGNOSIS — R17 Unspecified jaundice: Secondary | ICD-10-CM | POA: Diagnosis not present

## 2017-10-04 DIAGNOSIS — D631 Anemia in chronic kidney disease: Secondary | ICD-10-CM | POA: Diagnosis not present

## 2017-10-04 DIAGNOSIS — N2589 Other disorders resulting from impaired renal tubular function: Secondary | ICD-10-CM | POA: Diagnosis not present

## 2017-10-04 DIAGNOSIS — N186 End stage renal disease: Secondary | ICD-10-CM | POA: Diagnosis not present

## 2017-10-04 DIAGNOSIS — Z79899 Other long term (current) drug therapy: Secondary | ICD-10-CM | POA: Diagnosis not present

## 2017-10-04 DIAGNOSIS — Z5189 Encounter for other specified aftercare: Secondary | ICD-10-CM | POA: Diagnosis not present

## 2017-10-04 DIAGNOSIS — D509 Iron deficiency anemia, unspecified: Secondary | ICD-10-CM | POA: Diagnosis not present

## 2017-10-04 LAB — PATHOLOGIST SMEAR REVIEW

## 2017-10-05 DIAGNOSIS — N186 End stage renal disease: Secondary | ICD-10-CM | POA: Diagnosis not present

## 2017-10-05 DIAGNOSIS — N2589 Other disorders resulting from impaired renal tubular function: Secondary | ICD-10-CM | POA: Diagnosis not present

## 2017-10-05 DIAGNOSIS — N2581 Secondary hyperparathyroidism of renal origin: Secondary | ICD-10-CM | POA: Diagnosis not present

## 2017-10-05 DIAGNOSIS — D509 Iron deficiency anemia, unspecified: Secondary | ICD-10-CM | POA: Diagnosis not present

## 2017-10-05 DIAGNOSIS — K659 Peritonitis, unspecified: Secondary | ICD-10-CM | POA: Diagnosis not present

## 2017-10-05 DIAGNOSIS — D631 Anemia in chronic kidney disease: Secondary | ICD-10-CM | POA: Diagnosis not present

## 2017-10-05 LAB — GLUCOSE, BODY FLUID OTHER: Glucose, Body Fluid Other: 79 mg/dL

## 2017-10-06 DIAGNOSIS — N2581 Secondary hyperparathyroidism of renal origin: Secondary | ICD-10-CM | POA: Diagnosis not present

## 2017-10-06 DIAGNOSIS — D509 Iron deficiency anemia, unspecified: Secondary | ICD-10-CM | POA: Diagnosis not present

## 2017-10-06 DIAGNOSIS — N2589 Other disorders resulting from impaired renal tubular function: Secondary | ICD-10-CM | POA: Diagnosis not present

## 2017-10-06 DIAGNOSIS — K659 Peritonitis, unspecified: Secondary | ICD-10-CM | POA: Diagnosis not present

## 2017-10-06 DIAGNOSIS — D631 Anemia in chronic kidney disease: Secondary | ICD-10-CM | POA: Diagnosis not present

## 2017-10-06 DIAGNOSIS — N186 End stage renal disease: Secondary | ICD-10-CM | POA: Diagnosis not present

## 2017-10-07 DIAGNOSIS — N186 End stage renal disease: Secondary | ICD-10-CM | POA: Diagnosis not present

## 2017-10-07 DIAGNOSIS — K659 Peritonitis, unspecified: Secondary | ICD-10-CM | POA: Diagnosis not present

## 2017-10-07 DIAGNOSIS — N2589 Other disorders resulting from impaired renal tubular function: Secondary | ICD-10-CM | POA: Diagnosis not present

## 2017-10-07 DIAGNOSIS — D509 Iron deficiency anemia, unspecified: Secondary | ICD-10-CM | POA: Diagnosis not present

## 2017-10-07 DIAGNOSIS — N2581 Secondary hyperparathyroidism of renal origin: Secondary | ICD-10-CM | POA: Diagnosis not present

## 2017-10-07 DIAGNOSIS — D631 Anemia in chronic kidney disease: Secondary | ICD-10-CM | POA: Diagnosis not present

## 2017-10-08 DIAGNOSIS — K659 Peritonitis, unspecified: Secondary | ICD-10-CM | POA: Diagnosis not present

## 2017-10-08 DIAGNOSIS — N186 End stage renal disease: Secondary | ICD-10-CM | POA: Diagnosis not present

## 2017-10-08 DIAGNOSIS — D631 Anemia in chronic kidney disease: Secondary | ICD-10-CM | POA: Diagnosis not present

## 2017-10-08 DIAGNOSIS — N2581 Secondary hyperparathyroidism of renal origin: Secondary | ICD-10-CM | POA: Diagnosis not present

## 2017-10-08 DIAGNOSIS — D509 Iron deficiency anemia, unspecified: Secondary | ICD-10-CM | POA: Diagnosis not present

## 2017-10-08 DIAGNOSIS — N2589 Other disorders resulting from impaired renal tubular function: Secondary | ICD-10-CM | POA: Diagnosis not present

## 2017-10-09 DIAGNOSIS — D509 Iron deficiency anemia, unspecified: Secondary | ICD-10-CM | POA: Diagnosis not present

## 2017-10-09 DIAGNOSIS — D631 Anemia in chronic kidney disease: Secondary | ICD-10-CM | POA: Diagnosis not present

## 2017-10-09 DIAGNOSIS — N2589 Other disorders resulting from impaired renal tubular function: Secondary | ICD-10-CM | POA: Diagnosis not present

## 2017-10-09 DIAGNOSIS — K659 Peritonitis, unspecified: Secondary | ICD-10-CM | POA: Diagnosis not present

## 2017-10-09 DIAGNOSIS — N2581 Secondary hyperparathyroidism of renal origin: Secondary | ICD-10-CM | POA: Diagnosis not present

## 2017-10-09 DIAGNOSIS — N186 End stage renal disease: Secondary | ICD-10-CM | POA: Diagnosis not present

## 2017-10-09 LAB — CULTURE, BLOOD (ROUTINE X 2)
Culture: NO GROWTH
Special Requests: ADEQUATE

## 2017-10-09 LAB — CULTURE, BODY FLUID-BOTTLE: SPECIAL REQUESTS: ADEQUATE

## 2017-10-09 LAB — CULTURE, BODY FLUID W GRAM STAIN -BOTTLE: Culture: NO GROWTH

## 2017-10-10 DIAGNOSIS — D631 Anemia in chronic kidney disease: Secondary | ICD-10-CM | POA: Diagnosis not present

## 2017-10-10 DIAGNOSIS — N2589 Other disorders resulting from impaired renal tubular function: Secondary | ICD-10-CM | POA: Diagnosis not present

## 2017-10-10 DIAGNOSIS — N2581 Secondary hyperparathyroidism of renal origin: Secondary | ICD-10-CM | POA: Diagnosis not present

## 2017-10-10 DIAGNOSIS — D509 Iron deficiency anemia, unspecified: Secondary | ICD-10-CM | POA: Diagnosis not present

## 2017-10-10 DIAGNOSIS — K659 Peritonitis, unspecified: Secondary | ICD-10-CM | POA: Diagnosis not present

## 2017-10-10 DIAGNOSIS — N186 End stage renal disease: Secondary | ICD-10-CM | POA: Diagnosis not present

## 2017-10-11 DIAGNOSIS — D509 Iron deficiency anemia, unspecified: Secondary | ICD-10-CM | POA: Diagnosis not present

## 2017-10-11 DIAGNOSIS — K659 Peritonitis, unspecified: Secondary | ICD-10-CM | POA: Diagnosis not present

## 2017-10-11 DIAGNOSIS — N2589 Other disorders resulting from impaired renal tubular function: Secondary | ICD-10-CM | POA: Diagnosis not present

## 2017-10-11 DIAGNOSIS — Z23 Encounter for immunization: Secondary | ICD-10-CM | POA: Diagnosis not present

## 2017-10-11 DIAGNOSIS — N2581 Secondary hyperparathyroidism of renal origin: Secondary | ICD-10-CM | POA: Diagnosis not present

## 2017-10-11 DIAGNOSIS — D631 Anemia in chronic kidney disease: Secondary | ICD-10-CM | POA: Diagnosis not present

## 2017-10-11 DIAGNOSIS — N186 End stage renal disease: Secondary | ICD-10-CM | POA: Diagnosis not present

## 2017-10-11 LAB — GRAM STAIN: Gram Stain: NONE SEEN

## 2017-10-12 DIAGNOSIS — D509 Iron deficiency anemia, unspecified: Secondary | ICD-10-CM | POA: Diagnosis not present

## 2017-10-12 DIAGNOSIS — K659 Peritonitis, unspecified: Secondary | ICD-10-CM | POA: Diagnosis not present

## 2017-10-12 DIAGNOSIS — N2581 Secondary hyperparathyroidism of renal origin: Secondary | ICD-10-CM | POA: Diagnosis not present

## 2017-10-12 DIAGNOSIS — N2589 Other disorders resulting from impaired renal tubular function: Secondary | ICD-10-CM | POA: Diagnosis not present

## 2017-10-12 DIAGNOSIS — N186 End stage renal disease: Secondary | ICD-10-CM | POA: Diagnosis not present

## 2017-10-12 DIAGNOSIS — D631 Anemia in chronic kidney disease: Secondary | ICD-10-CM | POA: Diagnosis not present

## 2017-10-13 DIAGNOSIS — D509 Iron deficiency anemia, unspecified: Secondary | ICD-10-CM | POA: Diagnosis not present

## 2017-10-13 DIAGNOSIS — K659 Peritonitis, unspecified: Secondary | ICD-10-CM | POA: Diagnosis not present

## 2017-10-13 DIAGNOSIS — N2589 Other disorders resulting from impaired renal tubular function: Secondary | ICD-10-CM | POA: Diagnosis not present

## 2017-10-13 DIAGNOSIS — N186 End stage renal disease: Secondary | ICD-10-CM | POA: Diagnosis not present

## 2017-10-13 DIAGNOSIS — N2581 Secondary hyperparathyroidism of renal origin: Secondary | ICD-10-CM | POA: Diagnosis not present

## 2017-10-13 DIAGNOSIS — D631 Anemia in chronic kidney disease: Secondary | ICD-10-CM | POA: Diagnosis not present

## 2017-10-14 DIAGNOSIS — N2581 Secondary hyperparathyroidism of renal origin: Secondary | ICD-10-CM | POA: Diagnosis not present

## 2017-10-14 DIAGNOSIS — D509 Iron deficiency anemia, unspecified: Secondary | ICD-10-CM | POA: Diagnosis not present

## 2017-10-14 DIAGNOSIS — D631 Anemia in chronic kidney disease: Secondary | ICD-10-CM | POA: Diagnosis not present

## 2017-10-14 DIAGNOSIS — N2589 Other disorders resulting from impaired renal tubular function: Secondary | ICD-10-CM | POA: Diagnosis not present

## 2017-10-14 DIAGNOSIS — K659 Peritonitis, unspecified: Secondary | ICD-10-CM | POA: Diagnosis not present

## 2017-10-14 DIAGNOSIS — N186 End stage renal disease: Secondary | ICD-10-CM | POA: Diagnosis not present

## 2017-10-15 DIAGNOSIS — N186 End stage renal disease: Secondary | ICD-10-CM | POA: Diagnosis not present

## 2017-10-15 DIAGNOSIS — D509 Iron deficiency anemia, unspecified: Secondary | ICD-10-CM | POA: Diagnosis not present

## 2017-10-15 DIAGNOSIS — N2581 Secondary hyperparathyroidism of renal origin: Secondary | ICD-10-CM | POA: Diagnosis not present

## 2017-10-15 DIAGNOSIS — D631 Anemia in chronic kidney disease: Secondary | ICD-10-CM | POA: Diagnosis not present

## 2017-10-15 DIAGNOSIS — N2589 Other disorders resulting from impaired renal tubular function: Secondary | ICD-10-CM | POA: Diagnosis not present

## 2017-10-15 DIAGNOSIS — K659 Peritonitis, unspecified: Secondary | ICD-10-CM | POA: Diagnosis not present

## 2017-10-16 DIAGNOSIS — N2589 Other disorders resulting from impaired renal tubular function: Secondary | ICD-10-CM | POA: Diagnosis not present

## 2017-10-16 DIAGNOSIS — D509 Iron deficiency anemia, unspecified: Secondary | ICD-10-CM | POA: Diagnosis not present

## 2017-10-16 DIAGNOSIS — K659 Peritonitis, unspecified: Secondary | ICD-10-CM | POA: Diagnosis not present

## 2017-10-16 DIAGNOSIS — N2581 Secondary hyperparathyroidism of renal origin: Secondary | ICD-10-CM | POA: Diagnosis not present

## 2017-10-16 DIAGNOSIS — N186 End stage renal disease: Secondary | ICD-10-CM | POA: Diagnosis not present

## 2017-10-16 DIAGNOSIS — D631 Anemia in chronic kidney disease: Secondary | ICD-10-CM | POA: Diagnosis not present

## 2017-10-17 DIAGNOSIS — N2581 Secondary hyperparathyroidism of renal origin: Secondary | ICD-10-CM | POA: Diagnosis not present

## 2017-10-17 DIAGNOSIS — D631 Anemia in chronic kidney disease: Secondary | ICD-10-CM | POA: Diagnosis not present

## 2017-10-17 DIAGNOSIS — N2589 Other disorders resulting from impaired renal tubular function: Secondary | ICD-10-CM | POA: Diagnosis not present

## 2017-10-17 DIAGNOSIS — K659 Peritonitis, unspecified: Secondary | ICD-10-CM | POA: Diagnosis not present

## 2017-10-17 DIAGNOSIS — N186 End stage renal disease: Secondary | ICD-10-CM | POA: Diagnosis not present

## 2017-10-17 DIAGNOSIS — D509 Iron deficiency anemia, unspecified: Secondary | ICD-10-CM | POA: Diagnosis not present

## 2017-10-18 ENCOUNTER — Observation Stay (HOSPITAL_COMMUNITY)
Admission: EM | Admit: 2017-10-18 | Discharge: 2017-10-19 | Disposition: A | Discharge status: Home / Self Care | Payer: Medicare Other | Attending: Family Medicine | Admitting: Family Medicine

## 2017-10-18 ENCOUNTER — Other Ambulatory Visit: Payer: Self-pay

## 2017-10-18 ENCOUNTER — Encounter (HOSPITAL_COMMUNITY): Payer: Self-pay

## 2017-10-18 ENCOUNTER — Emergency Department (HOSPITAL_COMMUNITY): Payer: Medicare Other

## 2017-10-18 DIAGNOSIS — I6783 Posterior reversible encephalopathy syndrome: Secondary | ICD-10-CM | POA: Diagnosis present

## 2017-10-18 DIAGNOSIS — S0093XA Contusion of unspecified part of head, initial encounter: Secondary | ICD-10-CM | POA: Diagnosis not present

## 2017-10-18 DIAGNOSIS — S065X1A Traumatic subdural hemorrhage with loss of consciousness of 30 minutes or less, initial encounter: Principal | ICD-10-CM | POA: Insufficient documentation

## 2017-10-18 DIAGNOSIS — S06340A Traumatic hemorrhage of right cerebrum without loss of consciousness, initial encounter: Secondary | ICD-10-CM | POA: Diagnosis not present

## 2017-10-18 DIAGNOSIS — S066X9A Traumatic subarachnoid hemorrhage with loss of consciousness of unspecified duration, initial encounter: Secondary | ICD-10-CM | POA: Diagnosis present

## 2017-10-18 DIAGNOSIS — W01198A Fall on same level from slipping, tripping and stumbling with subsequent striking against other object, initial encounter: Secondary | ICD-10-CM | POA: Diagnosis not present

## 2017-10-18 DIAGNOSIS — Z79899 Other long term (current) drug therapy: Secondary | ICD-10-CM | POA: Diagnosis not present

## 2017-10-18 DIAGNOSIS — M6281 Muscle weakness (generalized): Secondary | ICD-10-CM | POA: Diagnosis not present

## 2017-10-18 DIAGNOSIS — I120 Hypertensive chronic kidney disease with stage 5 chronic kidney disease or end stage renal disease: Secondary | ICD-10-CM | POA: Insufficient documentation

## 2017-10-18 DIAGNOSIS — D631 Anemia in chronic kidney disease: Secondary | ICD-10-CM | POA: Diagnosis not present

## 2017-10-18 DIAGNOSIS — Z7982 Long term (current) use of aspirin: Secondary | ICD-10-CM | POA: Insufficient documentation

## 2017-10-18 DIAGNOSIS — E039 Hypothyroidism, unspecified: Secondary | ICD-10-CM | POA: Diagnosis not present

## 2017-10-18 DIAGNOSIS — Y92002 Bathroom of unspecified non-institutional (private) residence single-family (private) house as the place of occurrence of the external cause: Secondary | ICD-10-CM | POA: Diagnosis not present

## 2017-10-18 DIAGNOSIS — G8114 Spastic hemiplegia affecting left nondominant side: Secondary | ICD-10-CM | POA: Diagnosis present

## 2017-10-18 DIAGNOSIS — S065X9A Traumatic subdural hemorrhage with loss of consciousness of unspecified duration, initial encounter: Secondary | ICD-10-CM

## 2017-10-18 DIAGNOSIS — G40909 Epilepsy, unspecified, not intractable, without status epilepticus: Secondary | ICD-10-CM

## 2017-10-18 DIAGNOSIS — I619 Nontraumatic intracerebral hemorrhage, unspecified: Secondary | ICD-10-CM | POA: Diagnosis present

## 2017-10-18 DIAGNOSIS — Y999 Unspecified external cause status: Secondary | ICD-10-CM | POA: Diagnosis not present

## 2017-10-18 DIAGNOSIS — Y9389 Activity, other specified: Secondary | ICD-10-CM | POA: Diagnosis not present

## 2017-10-18 DIAGNOSIS — I609 Nontraumatic subarachnoid hemorrhage, unspecified: Secondary | ICD-10-CM

## 2017-10-18 DIAGNOSIS — S066XAA Traumatic subarachnoid hemorrhage with loss of consciousness status unknown, initial encounter: Secondary | ICD-10-CM | POA: Diagnosis present

## 2017-10-18 DIAGNOSIS — S098XXA Other specified injuries of head, initial encounter: Secondary | ICD-10-CM | POA: Diagnosis present

## 2017-10-18 DIAGNOSIS — D509 Iron deficiency anemia, unspecified: Secondary | ICD-10-CM | POA: Diagnosis not present

## 2017-10-18 DIAGNOSIS — N186 End stage renal disease: Secondary | ICD-10-CM | POA: Diagnosis not present

## 2017-10-18 DIAGNOSIS — N2581 Secondary hyperparathyroidism of renal origin: Secondary | ICD-10-CM | POA: Diagnosis not present

## 2017-10-18 DIAGNOSIS — E785 Hyperlipidemia, unspecified: Secondary | ICD-10-CM | POA: Diagnosis present

## 2017-10-18 DIAGNOSIS — Z8673 Personal history of transient ischemic attack (TIA), and cerebral infarction without residual deficits: Secondary | ICD-10-CM | POA: Diagnosis not present

## 2017-10-18 DIAGNOSIS — N2589 Other disorders resulting from impaired renal tubular function: Secondary | ICD-10-CM | POA: Diagnosis not present

## 2017-10-18 DIAGNOSIS — S065X0A Traumatic subdural hemorrhage without loss of consciousness, initial encounter: Secondary | ICD-10-CM | POA: Diagnosis not present

## 2017-10-18 DIAGNOSIS — Z992 Dependence on renal dialysis: Secondary | ICD-10-CM | POA: Insufficient documentation

## 2017-10-18 DIAGNOSIS — S065XAA Traumatic subdural hemorrhage with loss of consciousness status unknown, initial encounter: Secondary | ICD-10-CM

## 2017-10-18 DIAGNOSIS — I6200 Nontraumatic subdural hemorrhage, unspecified: Secondary | ICD-10-CM | POA: Diagnosis not present

## 2017-10-18 DIAGNOSIS — K659 Peritonitis, unspecified: Secondary | ICD-10-CM | POA: Diagnosis not present

## 2017-10-18 DIAGNOSIS — R51 Headache: Secondary | ICD-10-CM | POA: Diagnosis not present

## 2017-10-18 LAB — BASIC METABOLIC PANEL
ANION GAP: 21 — AB (ref 5–15)
BUN: 55 mg/dL — ABNORMAL HIGH (ref 6–20)
CALCIUM: 8.8 mg/dL — AB (ref 8.9–10.3)
CO2: 24 mmol/L (ref 22–32)
Chloride: 99 mmol/L — ABNORMAL LOW (ref 101–111)
Creatinine, Ser: 14.02 mg/dL — ABNORMAL HIGH (ref 0.44–1.00)
GFR calc Af Amer: 3 mL/min — ABNORMAL LOW (ref >60)
GFR calc non Af Amer: 3 mL/min — ABNORMAL LOW (ref >60)
GLUCOSE: 88 mg/dL (ref 65–99)
Potassium: 3.6 mmol/L (ref 3.5–5.1)
Sodium: 144 mmol/L (ref 135–145)

## 2017-10-18 LAB — CBC WITH DIFFERENTIAL/PLATELET
BASOS PCT: 1 %
Basophils Absolute: 0.1 10*3/uL (ref 0.0–0.1)
Eosinophils Absolute: 0.3 10*3/uL (ref 0.0–0.7)
Eosinophils Relative: 2 %
HEMATOCRIT: 38.3 % (ref 36.0–46.0)
Hemoglobin: 11.3 g/dL — ABNORMAL LOW (ref 12.0–15.0)
LYMPHS PCT: 13 %
Lymphs Abs: 1.6 10*3/uL (ref 0.7–4.0)
MCH: 32.3 pg (ref 26.0–34.0)
MCHC: 29.5 g/dL — AB (ref 30.0–36.0)
MCV: 109.4 fL — AB (ref 78.0–100.0)
MONO ABS: 1.7 10*3/uL — AB (ref 0.1–1.0)
MONOS PCT: 14 %
NEUTROS ABS: 8.9 10*3/uL — AB (ref 1.7–7.7)
Neutrophils Relative %: 70 %
Platelets: 397 10*3/uL (ref 150–400)
RBC: 3.5 MIL/uL — ABNORMAL LOW (ref 3.87–5.11)
RDW: 18.7 % — AB (ref 11.5–15.5)
WBC: 12.6 10*3/uL — ABNORMAL HIGH (ref 4.0–10.5)

## 2017-10-18 LAB — PROTIME-INR
INR: 1.09
Prothrombin Time: 14 seconds (ref 11.4–15.2)

## 2017-10-18 MED ORDER — DOCUSATE SODIUM 100 MG PO CAPS
100.0000 mg | ORAL_CAPSULE | Freq: Two times a day (BID) | ORAL | Status: DC
  Administered 2017-10-18 – 2017-10-19 (×2): 100 mg via ORAL
  Filled 2017-10-18 (×2): qty 1

## 2017-10-18 MED ORDER — HEPARIN 1000 UNIT/ML FOR PERITONEAL DIALYSIS: INTRAMUSCULAR | Status: DC | PRN

## 2017-10-18 MED ORDER — LEVOTHYROXINE SODIUM 88 MCG PO TABS
88.0000 ug | ORAL_TABLET | Freq: Every day | ORAL | Status: DC
  Administered 2017-10-19: 88 ug via ORAL
  Filled 2017-10-18: qty 1

## 2017-10-18 MED ORDER — FLUOXETINE HCL 20 MG PO CAPS
20.0000 mg | ORAL_CAPSULE | Freq: Every day | ORAL | Status: DC
  Administered 2017-10-19: 20 mg via ORAL
  Filled 2017-10-18: qty 1

## 2017-10-18 MED ORDER — ONDANSETRON HCL 4 MG/2ML IJ SOLN
4.0000 mg | Freq: Four times a day (QID) | INTRAMUSCULAR | Status: DC | PRN
Start: 2017-10-18 — End: 2017-10-19

## 2017-10-18 MED ORDER — DELFLEX-LC/1.5% DEXTROSE 344 MOSM/L IP SOLN: Freq: Every day | INTRAPERITONEAL | Status: DC

## 2017-10-18 MED ORDER — ACETAMINOPHEN 650 MG RE SUPP: 650.0000 mg | Freq: Four times a day (QID) | RECTAL | Status: DC | PRN

## 2017-10-18 MED ORDER — DELFLEX-LC/1.5% DEXTROSE 344 MOSM/L IP SOLN
Freq: Every day | INTRAPERITONEAL | Status: DC
Start: 2017-10-18 — End: 2017-10-19

## 2017-10-18 MED ORDER — AMLODIPINE BESYLATE 10 MG PO TABS
10.0000 mg | ORAL_TABLET | Freq: Every day | ORAL | Status: DC
  Administered 2017-10-18 – 2017-10-19 (×2): 10 mg via ORAL
  Filled 2017-10-18 (×2): qty 1

## 2017-10-18 MED ORDER — POLYETHYLENE GLYCOL 3350 17 G PO PACK
17.0000 g | PACK | Freq: Every day | ORAL | Status: DC | PRN
Start: 2017-10-18 — End: 2017-10-19

## 2017-10-18 MED ORDER — CALCIUM ACETATE (PHOS BINDER) 667 MG PO CAPS: 1334.0000 mg | ORAL_CAPSULE | ORAL | Status: DC

## 2017-10-18 MED ORDER — PANTOPRAZOLE SODIUM 40 MG PO TBEC
40.0000 mg | DELAYED_RELEASE_TABLET | Freq: Two times a day (BID) | ORAL | Status: DC
  Administered 2017-10-18 – 2017-10-19 (×2): 40 mg via ORAL
  Filled 2017-10-18 (×2): qty 1

## 2017-10-18 MED ORDER — HYDROCODONE-ACETAMINOPHEN 5-325 MG PO TABS: 1.0000 | ORAL_TABLET | ORAL | Status: DC | PRN

## 2017-10-18 MED ORDER — DELFLEX-LC/1.5% DEXTROSE 344 MOSM/L IP SOLN: INTRAPERITONEAL | Status: DC

## 2017-10-18 MED ORDER — ONDANSETRON HCL 4 MG PO TABS: 4.0000 mg | ORAL_TABLET | Freq: Four times a day (QID) | ORAL | Status: DC | PRN

## 2017-10-18 MED ORDER — CALCIUM ACETATE (PHOS BINDER) 667 MG PO CAPS
1334.0000 mg | ORAL_CAPSULE | Freq: Three times a day (TID) | ORAL | Status: DC
  Administered 2017-10-19 (×3): 1334 mg via ORAL
  Filled 2017-10-18 (×3): qty 2

## 2017-10-18 MED ORDER — CINACALCET HCL 30 MG PO TABS
60.0000 mg | ORAL_TABLET | ORAL | Status: DC
  Administered 2017-10-18: 60 mg via ORAL
  Filled 2017-10-18: qty 2

## 2017-10-18 MED ORDER — FENTANYL CITRATE (PF) 100 MCG/2ML IJ SOLN
50.0000 ug | Freq: Once | INTRAMUSCULAR | Status: AC
  Administered 2017-10-18: 50 ug via INTRAVENOUS
  Filled 2017-10-18: qty 2

## 2017-10-18 MED ORDER — RENA-VITE PO TABS
1.0000 | ORAL_TABLET | Freq: Every day | ORAL | Status: DC
  Administered 2017-10-19: 1 via ORAL
  Filled 2017-10-18: qty 1

## 2017-10-18 MED ORDER — HEPARIN 1000 UNIT/ML FOR PERITONEAL DIALYSIS: 500.0000 [IU] | INTRAMUSCULAR | Status: DC | PRN

## 2017-10-18 MED ORDER — LEVETIRACETAM 500 MG PO TABS
500.0000 mg | ORAL_TABLET | Freq: Two times a day (BID) | ORAL | Status: DC
  Administered 2017-10-18 – 2017-10-19 (×2): 500 mg via ORAL
  Filled 2017-10-18 (×2): qty 1

## 2017-10-18 MED ORDER — FAMOTIDINE 20 MG PO TABS
20.0000 mg | ORAL_TABLET | Freq: Every day | ORAL | Status: DC
  Administered 2017-10-19: 20 mg via ORAL
  Filled 2017-10-18: qty 1

## 2017-10-18 MED ORDER — GENTAMICIN SULFATE 0.1 % EX CREA
1.0000 "application " | TOPICAL_CREAM | Freq: Every day | CUTANEOUS | Status: DC
  Administered 2017-10-18 – 2017-10-19 (×2): 1 via TOPICAL
  Filled 2017-10-18: qty 15

## 2017-10-18 MED ORDER — CARVEDILOL 12.5 MG PO TABS
25.0000 mg | ORAL_TABLET | Freq: Two times a day (BID) | ORAL | Status: DC
  Administered 2017-10-18 – 2017-10-19 (×2): 25 mg via ORAL
  Filled 2017-10-18 (×2): qty 2

## 2017-10-18 MED ORDER — OXYCODONE-ACETAMINOPHEN 5-325 MG PO TABS: 1.0000 | ORAL_TABLET | Freq: Four times a day (QID) | ORAL | Status: DC | PRN

## 2017-10-18 MED ORDER — ACETAMINOPHEN 325 MG PO TABS: 650.0000 mg | ORAL_TABLET | Freq: Four times a day (QID) | ORAL | Status: DC | PRN

## 2017-10-18 NOTE — ED Notes (Signed)
Bed: WA03 Expected date:  Expected time:  Means of arrival:  Comments: hold 

## 2017-10-18 NOTE — H&P (Signed)
History and Physical    Terry Juarez IFO:[id number] DOB: 1966-06-30 DOA: 10/18/2017  PCP: [name], MD  Patient coming from: home   Chief Complaint: Fall  HPI: Terry Juarez is a 52 y.o. female with medical history significant of end-stage renal disease on peritoneal dialysis nightly, hypertension, anemia, hyperlipidemia, hypothyroidism, history of PR ES complicated by large intracranial hemorrhage in June 2018 with chronic left-sided deficits who comes in after a fall and found to have possible new subdural hematoma.  Patient reports that she lives with her brother and father and she is relatively ambulatory despite her left-sided weakness.  She was ambulating to the bathroom and she tripped and fell and hit the left side of her head/face.  She had pain in that area but no loss of consciousness.  She denies any prior chest pain, palpitations, nausea, vomiting, abdominal pain, cough.  She denies any recent fevers, congestion, rhinorrhea.  She is otherwise doing well.  ED Course: In the ED vitals were unremarkable except for mild hypertension.  Labs are notable for elevated creatinine which was expected.  Head CT read at approximately 15:35 on 10/18/2017 showed right-sided subdural hematoma of approximately 3-4 mm when compared to prior CT.  Neurosurgery was consulted and felt that this hematoma could possibly be continued bleeding from her prior PR ES complicated by large intracranial hemorrhage however they recommended repeat CT in approximately 12-24 hours.  Patient will be admitted to [facility] for PD.  Review of Systems: As per HPI otherwise 10 point review of systems negative.    Past Medical History:  Diagnosis Date  . Anemia   . Bruises easily   . Dialysis patient ([place])   . ESRD on dialysis ([place])   . Hyperlipidemia   . Hypertension   . Renal disorder    rt renal mass / < functioning of left kidney - being prepared for possible dialysis  . Right renal mass   . Stroke [facility])      Past Surgical History:  Procedure Laterality Date  . AV FISTULA PLACEMENT Right 07/11/2014   Procedure: ARTERIOVENOUS (AV) FISTULA CREATION RIGHT ARM BRACHIO-CEPHALIC WITH ATTEMPTED RADIO-CEPHALIC (AV) FISTULA;  Surgeon: Mal Misty, MD;  Location: [place];  Service: Vascular;  Laterality: Right;  . COLONOSCOPY WITH PROPOFOL N/A 09/04/2016   Procedure: COLONOSCOPY WITH PROPOFOL;  Surgeon: Carol Ada, MD;  Location: [facility] ENDOSCOPY;  Service: Endoscopy;  Laterality: N/A;  . ECTOPIC PREGNANCY SURGERY  1987  . ESOPHAGOGASTRODUODENOSCOPY N/A 07/30/2016   Procedure: ESOPHAGOGASTRODUODENOSCOPY (EGD);  Surgeon: Carol Ada, MD;  Location: [facility] ENDOSCOPY;  Service: Endoscopy;  Laterality: N/A;  Bedside  . ESOPHAGOGASTRODUODENOSCOPY (EGD) WITH PROPOFOL N/A 09/04/2016   Procedure: ESOPHAGOGASTRODUODENOSCOPY (EGD) WITH PROPOFOL;  Surgeon: Carol Ada, MD;  Location: [facility]OS[facility];  Service: Endoscopy;  Laterality: N/A;  . ESOPHAGOGASTRODUODENOSCOPY (EGD) WITH PROPOFOL N/A 08/27/2017   Procedure: ESOPHAGOGASTRODUODENOSCOPY (EGD) WITH PROPOFOL;  Surgeon: Carol Ada, MD;  Location: [facility]OS[facility];  Service: Endoscopy;  Laterality: N/A;  . INSERTION OF DIALYSIS CATHETER Right 07/11/2014   Procedure: INSERTION OF DIALYSIS CATHETER IN RIGHT INTERNAL JUGULAR ;  Surgeon: Mal Misty, MD;  Location: [place];  Service: Vascular;  Laterality: Right;  . LAPAROSCOPIC NEPHRECTOMY Right 07/25/2014   Procedure: RIGHT LAPAROSCOPIC RADICAL NEPHRECTOMY ;  Surgeon: Ardis Hughs, MD;  Location: [facility];  Service: Urology;  Laterality: Right;  . PATCH ANGIOPLASTY Right 07/11/2014   Procedure: PATCH ANGIOPLASTY OF RIGHT RADIAL ARTERY USING CEPHALIC VEIN.;  Surgeon: Mal Misty, MD;  Location: [facility] OR;  Service: Vascular;  Laterality: Right;  . THROMBECTOMY W/ EMBOLECTOMY Right 07/11/2014   Procedure: THROMBECTOMY OF RIGHT RADIAL ARTERY  ;  Surgeon: Mal Misty, MD;  Location: [place];  Service: Vascular;  Laterality: Right;      reports that she has never smoked. She has never used smokeless tobacco. She reports that she drinks about 1.2 oz of alcohol per week. She reports that she does not use drugs.  Allergies  Allergen Reactions  . Lisinopril Cough    Family History  Problem Relation Age of Onset  . Throat cancer Mother        smoked  . Hypertension Father    Prior to Admission medications   Medication Sig Start Date End Date Taking? Authorizing Provider  acetaminophen (TYLENOL) 500 MG tablet Take 1,000 mg by mouth every 6 (six) hours as needed for moderate pain or headache.   Yes [provider]  amLODipine (NORVASC) 10 MG tablet Take 1 tablet (10 mg total) by mouth daily. 02/01/17  Yes Love, Ivan Anchors, PA-C  aspirin EC 81 MG tablet Take 1 tablet (81 mg total) by mouth daily. 04/08/17  Yes Rosalin Hawking, MD  calcium acetate (PHOSLO) 667 MG capsule Take 2 capsules (1,334 mg total) by mouth 3 (three) times daily with meals. Patient taking differently: Take 1,334 mg by mouth See admin instructions. Take 2 capsules (1,334 mg) by mouth up to four times daily - with meals and snacks 02/01/17  Yes Love, Pamela S, PA-C  carvedilol (COREG) 12.5 MG tablet Take 25 mg by mouth 2 (two) times daily. 08/09/17  Yes [provider]  cinacalcet (SENSIPAR) 60 MG tablet Take 60 mg by mouth every Monday, Wednesday, and Friday.    Yes [provider]  famotidine (PEPCID) 20 MG tablet Take 1 tablet (20 mg total) by mouth daily. 02/01/17  Yes Love, Ivan Anchors, PA-C  FLUoxetine (PROZAC) 20 MG capsule Take 1 capsule (20 mg total) by mouth daily. 02/01/17  Yes Love, Ivan Anchors, PA-C  gentamicin cream (GARAMYCIN) 0.1 % Apply 1 application topically daily. 02/01/17  Yes Love, Ivan Anchors, PA-C  heparin 1000 UNIT/ML injection Inject 30 mLs into the vein as needed. Patient said she only has to use this when she sees fiber in her catheter 09/14/17  Yes [provider]  levETIRAcetam (KEPPRA) 500 MG tablet Take 1 tablet (500  mg total) by mouth 2 (two) times daily. 02/01/17  Yes Love, Ivan Anchors, PA-C  levothyroxine (SYNTHROID, LEVOTHROID) 88 MCG tablet Take 1 tablet (88 mcg total) by mouth daily before breakfast. 02/01/17  Yes Love, Ivan Anchors, PA-C  multivitamin (RENA-VIT) TABS tablet Take 1 tablet by mouth daily.   Yes [provider]  pantoprazole (PROTONIX) 40 MG tablet Take 1 tablet (40 mg total) by mouth 2 (two) times daily. 02/01/17  Yes Love, Ivan Anchors, PA-C  amoxicillin-clavulanate (AUGMENTIN) 875-125 MG tablet Take 1 tablet by mouth 2 (two) times daily. Patient not taking: Reported on 10/18/2017 09/25/17   Dalia Heading, PA-C  oxyCODONE-acetaminophen (PERCOCET/ROXICET) 5-325 MG tablet Take 1 tablet by mouth every 6 (six) hours as needed for severe pain. Patient not taking: Reported on 10/18/2017 10/03/17   Drenda Freeze, MD    Physical Exam: Vitals:   10/18/17 1144 10/18/17 1149 10/18/17 1625 10/18/17 1730  BP: 131/89  (!) 157/92 (!) 166/96  Pulse: 72  73 77  Resp: 16  15 16   Temp: 98.2 F (36.8 C)  TempSrc: Oral     SpO2: 96%  98% 100%  Weight:  52.2 kg (115 lb)    Height:  5\' 5"  (1.651 m)      Constitutional: NAD, calm, comfortable Vitals:   10/18/17 1144 10/18/17 1149 10/18/17 1625 10/18/17 1730  BP: 131/89  (!) 157/92 (!) 166/96  Pulse: 72  73 77  Resp: 16  15 16   Temp: 98.2 F (36.8 C)     TempSrc: Oral     SpO2: 96%  98% 100%  Weight:  52.2 kg (115 lb)    Height:  5\' 5"  (1.651 m)     Eyes: Left eye is puffy and swollen shut, extraocular muscles intact, pupils equally round and reactive ENMT: Moist mucous membranes, good dentition Neck: normal, supple, Respiratory: clear to auscultation bilaterally, no wheezing, no crackles. Normal respiratory effort. No accessory muscle use.  Cardiovascular: Regular rate and rhythm, 2 out of 6 systolic murmur heard across precordium radiating from right upper extremity AV fistula Abdomen: Soft, nontender, nondistended, well-healed  abdominal incisions, PD catheter site is clean dry and intact Musculoskeletal: n no lower extremity edema, right upper extremity AV fistula cannot palpate thrill or appreciate bruit Skin: no rashes on visible skin Neurologic: Mild facial droop on left side, difficult to assess due to swelling from contusion, right upper extremity strength is 5 out of 5, left upper extremity strength is 3 out of 5, severely diminished grip on left, left lower extremity strength is 4 out of 5 Psychiatric: Normal judgment and insight. Alert and oriented x 3. Normal mood.   Labs on Admission: I have personally reviewed following labs and imaging studies  CBC: Recent Labs  Lab 10/18/17 1612  WBC 12.6*  NEUTROABS 8.9*  HGB 11.3*  HCT 38.3  MCV 109.4*  PLT 093   Basic Metabolic Panel: Recent Labs  Lab 10/18/17 1612  NA 144  K 3.6  CL 99*  CO2 24  GLUCOSE 88  BUN 55*  CREATININE 14.02*  CALCIUM 8.8*   GFR: Estimated Creatinine Clearance: 3.9 mL/min (A) (by C-G formula based on SCr of 14.02 mg/dL (H)). Liver Function Tests: No results for input(s): AST, ALT, ALKPHOS, BILITOT, PROT, ALBUMIN in the last 168 hours. No results for input(s): LIPASE, AMYLASE in the last 168 hours. No results for input(s): AMMONIA in the last 168 hours. Coagulation Profile: Recent Labs  Lab 10/18/17 1612  INR 1.09   Cardiac Enzymes: No results for input(s): CKTOTAL, CKMB, CKMBINDEX, TROPONINI in the last 168 hours. BNP (last 3 results) No results for input(s): PROBNP in the last 8760 hours. HbA1C: No results for input(s): HGBA1C in the last 72 hours. CBG: No results for input(s): GLUCAP in the last 168 hours. Lipid Profile: No results for input(s): CHOL, HDL, LDLCALC, TRIG, CHOLHDL, LDLDIRECT in the last 72 hours. Thyroid Function Tests: No results for input(s): TSH, T4TOTAL, FREET4, T3FREE, THYROIDAB in the last 72 hours. Anemia Panel: No results for input(s): VITAMINB12, FOLATE, FERRITIN, TIBC, IRON,  RETICCTPCT in the last 72 hours. Urine analysis:    Component Value Date/Time   COLORURINE YELLOW 01/09/2017 1736   APPEARANCEUR CLOUDY (A) 01/09/2017 1736   LABSPEC 1.016 01/09/2017 1736   PHURINE 5.0 01/09/2017 1736   GLUCOSEU NEGATIVE 01/09/2017 1736   HGBUR SMALL (A) 01/09/2017 1736   BILIRUBINUR NEGATIVE 01/09/2017 1736   KETONESUR NEGATIVE 01/09/2017 1736   PROTEINUR 100 (A) 01/09/2017 1736   UROBILINOGEN 0.2 05/15/2014 1717   NITRITE NEGATIVE 01/09/2017 1736   LEUKOCYTESUR MODERATE (A)  01/09/2017 1736    Radiological Exams on Admission: Ct Head Wo Contrast  Addendum Date: 10/18/2017   ADDENDUM REPORT: 10/18/2017 15:50 ADDENDUM: Critical Value/emergent results were called by telephone at the time of interpretation on 10/18/2017 at 1539 hours to Dr. Addison Lank , who verbally acknowledged these results. Electronically Signed   By: Genevie Ann M.D.   On: 10/18/2017 15:50   Result Date: 10/18/2017 CLINICAL DATA:  52 year old female with acute onset lower extremity weakness causing a fall. Blunt trauma to the left eye with pain and swelling. History of PRES and intracranial hemorrhage in June 2018. EXAM: CT HEAD WITHOUT CONTRAST TECHNIQUE: Contiguous axial images were obtained from the base of the skull through the vertex without intravenous contrast. COMPARISON:  Head CT 12/24/2016 and earlier. FINDINGS: Brain: Mixed density but mostly hyperdense right side subdural hematoma measuring 3-4 millimeters in thickness (series 5, image 42) is new since the 2018 comparison. The hematoma is mildly lobulated over the right parietal convexity. No midline shift. No hemorrhagic contusion or other acute intracranial hemorrhage identified. Encephalomalacia with dystrophic calcification at the right superior frontal gyrus corresponding to the area of intra-axial hemorrhage in 2018. The ventricle system is within normal limits. Basilar cisterns are patent. Gray-white matter differentiation outside of the area  of right superior frontal lobe encephalomalacia appears normal. No cortically based acute infarct identified. Vascular: Calcified atherosclerosis at the skull base. No suspicious intracranial vascular hyperdensity. Skull: The visible left orbital walls appear stable and intact. No acute osseous abnormality identified. Sinuses/Orbits: Visualized paranasal sinuses and mastoids are stable and well pneumatized. Other: 16 MM thick broad-based hematoma along the left superolateral rim of the orbit and involving the preseptal space. The left globe appears to remain intact. The visible left postseptal and right orbit soft tissues appear normal. Hematoma tracks up the left lateral forehead scalp soft tissues. Other scalp soft tissues appear normal. IMPRESSION: 1. Small right lateral subdural hematoma measuring 3-4 mm in thickness. No midline shift or significant intracranial mass effect at this time. 2. No associated skull or visible right orbital wall fracture. Left periorbital and forehead superficial soft tissue hematoma. 3. No hemorrhagic contusion or other acute traumatic injury to the brain identified. 4. Right superior frontal lobe encephalomalacia related to the June 2018 intra-axial hemorrhage. Electronically Signed: By: Genevie Ann M.D. On: 10/18/2017 15:35    EKG: Independently reviewed.  Sinus rhythm, no acute ST segment changes  Assessment/Plan Active Problems:   Hyperlipidemia   ESRD on peritoneal dialysis ([place])   ICH (intracerebral hemorrhage) ([facility])   Seizure ([place])   PRES (posterior reversible encephalopathy syndrome)   Subarachnoid hematoma ([facility])   Hypothyroidism   Anemia due to chronic kidney disease, on chronic dialysis ([place])   Left spastic hemiplegia ([facility])    #) Subarachnoid hemorrhage: Unclear if this is related to a new event or per neurosurgery is simply resolution of her prior hemorrhage.  Regardless she appears to be minimally symptomatic from it. -Admit overnight -Hold home aspirin  81 mg -Repeat CT for approximately 1400 on 10/19/2017 -Neurosurgery consulted, appreciate recommendations -Pain control with Percocet  #) history of PRES complicated by subarachnoid hemorrhage and stroke with chronic left-sided deficits: - Continue prophylactic levetiracetam 500 mg twice daily  #) end-stage renal disease on peritoneal dialysis: Patient was on hemodialysis however switched to peritoneal dialysis approximately 1 year ago.  She apparently has end-stage renal disease because she had underlying chronic kidney disease and then had a right nephrectomy for renal cell carcinoma. -Continue cinacalcet  60 mg Monday Wednesday Friday -Continue PhosLo 3 times daily with meals -Continue multivitamin -Nephrology consulted, will see patient  #) Hypertension: -Continue amlodipine 10 mg daily -Continue carvedilol 25 mg twice daily  #)  Hypothyroidism: -Continue low levothyroxine 88 mcg daily  #) Psych/pain: -Continue Floxin 20 mg daily  Fluids: Tolerating p.o. Elect lites: Monitor and supplement Nutrition: Renal diet  Prophylaxis: SCDs  Disposition: Pending repeat head CT  Full code   Cristy Folks MD [facility]   If 7PM-7AM, please contact night-coverage [email or web address] Password [facility]  10/18/2017, 5:56 PM

## 2017-10-18 NOTE — ED Provider Notes (Signed)
[facility] DEPT Provider Note   CSN: [id number] Arrival date & time: 10/18/17  1117     History   Chief Complaint Chief Complaint  Patient presents with  . Fall  . Eye Injury  . Hip Pain    HPI Terry Juarez is a 52 y.o. female.  HPI  52 year old female with a history of end-stage renal disease on peritoneal dialysis, prior stroke with left-sided deficits, history of intracranial hemorrhage from PRES  presents after a fall.  She states that she intermittently has periods where her legs seem to give out.  When she was walking to the bathroom today on a sleep floor she either slipped or her legs briefly gave out.  Friend saw her unconscious on the floor briefly.  She has been having left periorbital swelling and frontal headache.  No vomiting.  Headache is rated as a 9.  This occurred around 8 AM.  She denies feeling dizzy or lightheaded before the fall.  She is on a baby aspirin but denies any other blood thinners.  Last dialysis was last night.  She thinks she injured her left hip but has been able to embed on it and the pain is much milder there.  Past Medical History:  Diagnosis Date  . Anemia   . Bruises easily   . Dialysis patient ([place])   . ESRD on dialysis ([place])   . Hyperlipidemia   . Hypertension   . Renal disorder    rt renal mass / < functioning of left kidney - being prepared for possible dialysis  . Right renal mass   . Stroke [facility])     Patient Active Problem List   Diagnosis Date Noted  . Left spastic hemiplegia ([place]) 08/06/2017  . Anemia due to chronic kidney disease, on chronic dialysis ([place]) 07/28/2017  . Hypertension secondary to other renal disorders 04/08/2017  . Left spastic hemiparesis ([place])   . Fall   . Slow transit constipation 01/28/2017  . Flaccid hemiplegia of left nondominant side as late effect of nontraumatic intraparenchymal hemorrhage of brain ([place])   . ESRD on dialysis ([place])   . Leukocytosis   .  Hyperglycemia   . Hypertensive emergency 01/04/2017  . Acute left hemiparesis ([place]) 01/04/2017  . Intracranial hemorrhage ([place]) 01/04/2017  . Subarachnoid hematoma ([place])   . Hypothyroidism   . Benign essential HTN   . Seizure prophylaxis   . Cardiac arrest, cause unspecified ([place])   . Acute respiratory failure with hypoxemia ([place])   . Acute encephalopathy   . ICH (intracerebral hemorrhage) ([place])   . Seizure ([place])   . PRES (posterior reversible encephalopathy syndrome)   . Subarachnoid hemorrhage 12/19/2016  . Symptomatic anemia 07/29/2016  . Cough 01/09/2016  . Elevated troponin 02/12/2015  . Headache 02/12/2015  . Syncope 02/12/2015  . ESRD on peritoneal dialysis ([place]) 02/12/2015  . Hypertension   . Hyperlipidemia   . Anemia   . Essential hypertension   . Cephalalgia   . Right renal mass 07/25/2014    Past Surgical History:  Procedure Laterality Date  . AV FISTULA PLACEMENT Right 07/11/2014   Procedure: ARTERIOVENOUS (AV) FISTULA CREATION RIGHT ARM BRACHIO-CEPHALIC WITH ATTEMPTED RADIO-CEPHALIC (AV) FISTULA;  Surgeon: Mal Misty, MD;  Location: [facility];  Service: Vascular;  Laterality: Right;  . COLONOSCOPY WITH PROPOFOL N/A 09/04/2016   Procedure: COLONOSCOPY WITH PROPOFOL;  Surgeon: Carol Ada, MD;  Location: [facility];  Service: Endoscopy;  Laterality: N/A;  . ECTOPIC PREGNANCY SURGERY  1987  . ESOPHAGOGASTRODUODENOSCOPY N/A 07/30/2016   Procedure: ESOPHAGOGASTRODUODENOSCOPY (EGD);  Surgeon: Carol Ada, MD;  Location[facility] ENDOSCOPY;  Service: Endoscopy;  Laterality: N/A;  Bedside  . ESOPHAGOGASTRODUODENOSCOPY (EGD) WITH PROPOFOL N/A 09/04/2016   Procedure: ESOPHAGOGASTRODUODENOSCOPY (EGD) WITH PROPOFOL;  Surgeon: Carol Ada, MD;  Location: [facility];  Service: Endoscopy;  Laterality: N/A;  . ESOPHAGOGASTRODUODENOSCOPY (EGD) WITH PROPOFOL N/A 08/27/2017   Procedure: ESOPHAGOGASTRODUODENOSCOPY (EGD) WITH PROPOFOL;  Surgeon: Carol Ada, MD;  Location: [facility];   Service: Endoscopy;  Laterality: N/A;  . INSERTION OF DIALYSIS CATHETER Right 07/11/2014   Procedure: INSERTION OF DIALYSIS CATHETER IN RIGHT INTERNAL JUGULAR ;  Surgeon: Mal Misty, MD;  Location: [facility];  Service: Vascular;  Laterality: Right;  . LAPAROSCOPIC NEPHRECTOMY Right 07/25/2014   Procedure: RIGHT LAPAROSCOPIC RADICAL NEPHRECTOMY ;  Surgeon: Ardis Hughs, MD;  Location: [facility];  Service: Urology;  Laterality: Right;  . PATCH ANGIOPLASTY Right 07/11/2014   Procedure: PATCH ANGIOPLASTY OF RIGHT RADIAL ARTERY USING CEPHALIC VEIN.;  Surgeon: Mal Misty, MD;  Location: [facility];  Service: Vascular;  Laterality: Right;  . THROMBECTOMY W/ EMBOLECTOMY Right 07/11/2014   Procedure: THROMBECTOMY OF RIGHT RADIAL ARTERY  ;  Surgeon: Mal Misty, MD;  Location: [facility];  Service: Vascular;  Laterality: Right;     OB History   None      Home Medications    Prior to Admission medications   Medication Sig Start Date End Date Taking? Authorizing Provider  acetaminophen (TYLENOL) 500 MG tablet Take 1,000 mg by mouth every 6 (six) hours as needed for moderate pain or headache.    [provider]  amLODipine (NORVASC) 10 MG tablet Take 1 tablet (10 mg total) by mouth daily. Patient taking differently: Take 10 mg by mouth at bedtime.  02/01/17   Love, Ivan Anchors, PA-C  amoxicillin-clavulanate (AUGMENTIN) 875-125 MG tablet Take 1 tablet by mouth 2 (two) times daily. 09/25/17   Lawyer, Harrell Gave, PA-C  aspirin EC 81 MG tablet Take 1 tablet (81 mg total) by mouth daily. 04/08/17   Rosalin Hawking, MD  calcium acetate (PHOSLO) 667 MG capsule Take 2 capsules (1,334 mg total) by mouth 3 (three) times daily with meals. Patient taking differently: Take 1,334 mg by mouth See admin instructions. Take 2 capsules (1,334 mg) by mouth up to four times daily - with meals and snacks 02/01/17   Love, Pamela S, PA-C  carvedilol (COREG) 12.5 MG tablet Take 25 mg by mouth 2 (two) times daily. 08/09/17   [provider]  cinacalcet (SENSIPAR) 60 MG tablet Take 60 mg by mouth every Monday, Wednesday, and Friday.     [provider]  famotidine (PEPCID) 20 MG tablet Take 1 tablet (20 mg total) by mouth daily. 02/01/17   Love, Ivan Anchors, PA-C  FLUoxetine (PROZAC) 20 MG capsule Take 1 capsule (20 mg total) by mouth daily. 02/01/17   Love, Ivan Anchors, PA-C  gentamicin cream (GARAMYCIN) 0.1 % Apply 1 application topically daily. 02/01/17   Love, Ivan Anchors, PA-C  levETIRAcetam (KEPPRA) 500 MG tablet Take 1 tablet (500 mg total) by mouth 2 (two) times daily. 02/01/17   Love, Ivan Anchors, PA-C  levothyroxine (SYNTHROID, LEVOTHROID) 88 MCG tablet Take 1 tablet (88 mcg total) by mouth daily before breakfast. 02/01/17   Love, Ivan Anchors, PA-C  multivitamin (RENA-VIT) TABS tablet Take 1 tablet by mouth daily.    [provider]  oxyCODONE-acetaminophen (PERCOCET/ROXICET) 5-325 MG tablet Take 1 tablet by mouth  every 6 (six) hours as needed for severe pain. 10/03/17   Drenda Freeze, MD  pantoprazole (PROTONIX) 40 MG tablet Take 1 tablet (40 mg total) by mouth 2 (two) times daily. 02/01/17   Bary Leriche, PA-C    Family History Family History  Problem Relation Age of Onset  . Throat cancer Mother        smoked  . Hypertension Father     Social History Social History   Tobacco Use  . Smoking status: Never Smoker  . Smokeless tobacco: Never Used  Substance Use Topics  . Alcohol use: Yes    Alcohol/week: 1.2 oz    Types: 2 Glasses of wine per week    Comment: occ  . Drug use: No     Allergies   Lisinopril   Review of Systems Review of Systems  HENT: Positive for facial swelling.   Eyes: Negative for visual disturbance.  Gastrointestinal: Negative for vomiting.  Neurological: Positive for weakness (chronic, LUE, stable) and headaches.  All other systems reviewed and are negative.    Physical Exam Updated Vital Signs BP 131/89 (BP Location: Left Arm)   Pulse 72   Temp 98.2 F  (36.8 C) (Oral)   Resp 16   Ht 5\' 5"  (1.651 m)   Wt 52.2 kg (115 lb)   SpO2 96%   BMI 19.14 kg/m   Physical Exam  Constitutional: She is oriented to person, place, and time. She appears well-developed and well-nourished. No distress.  HENT:  Head: Normocephalic. Head is with contusion.    Right Ear: External ear normal.  Left Ear: External ear normal.  Nose: Nose normal.  No scalp tenderness  Eyes: Pupils are equal, round, and reactive to light. EOM are normal. Right eye exhibits no discharge. Left eye exhibits no discharge.  Left eye appears visually normal  Cardiovascular: Normal rate, regular rhythm and normal heart sounds.  Pulmonary/Chest: Effort normal and breath sounds normal.  Abdominal: Soft. She exhibits no distension. There is no tenderness.  Musculoskeletal:       Left hip: She exhibits normal range of motion and no tenderness.  Neurological: She is alert and oriented to person, place, and time.  Skin: Skin is warm and dry. She is not diaphoretic.  Nursing note and vitals reviewed.    ED Treatments / Results  Labs (all labs ordered are listed, but only abnormal results are displayed) Labs Reviewed  BASIC METABOLIC PANEL - Abnormal; Notable for the following components:      Result Value   Chloride 99 (*)    BUN 55 (*)    Creatinine, Ser 14.02 (*)    Calcium 8.8 (*)    GFR calc non Af Amer 3 (*)    GFR calc Af Amer 3 (*)    Anion gap 21 (*)    All other components within normal limits  CBC WITH DIFFERENTIAL/PLATELET - Abnormal; Notable for the following components:   WBC 12.6 (*)    RBC 3.50 (*)    Hemoglobin 11.3 (*)    MCV 109.4 (*)    MCHC 29.5 (*)    RDW 18.7 (*)    Neutro Abs 8.9 (*)    Monocytes Absolute 1.7 (*)    All other components within normal limits  PROTIME-INR  RENAL FUNCTION PANEL  CBC  BASIC METABOLIC PANEL  CBC    EKG None  Radiology Ct Head Wo Contrast  Addendum Date: 10/18/2017   ADDENDUM REPORT: 10/18/2017 15:50  ADDENDUM: Critical  Value/emergent results were called by telephone at the time of interpretation on 10/18/2017 at 1539 hours to Dr. Addison Lank , who verbally acknowledged these results. Electronically Signed   By: Genevie Ann M.D.   On: 10/18/2017 15:50   Result Date: 10/18/2017 CLINICAL DATA:  52 year old female with acute onset lower extremity weakness causing a fall. Blunt trauma to the left eye with pain and swelling. History of PRES and intracranial hemorrhage in June 2018. EXAM: CT HEAD WITHOUT CONTRAST TECHNIQUE: Contiguous axial images were obtained from the base of the skull through the vertex without intravenous contrast. COMPARISON:  Head CT 12/24/2016 and earlier. FINDINGS: Brain: Mixed density but mostly hyperdense right side subdural hematoma measuring 3-4 millimeters in thickness (series 5, image 42) is new since the 2018 comparison. The hematoma is mildly lobulated over the right parietal convexity. No midline shift. No hemorrhagic contusion or other acute intracranial hemorrhage identified. Encephalomalacia with dystrophic calcification at the right superior frontal gyrus corresponding to the area of intra-axial hemorrhage in 2018. The ventricle system is within normal limits. Basilar cisterns are patent. Gray-white matter differentiation outside of the area of right superior frontal lobe encephalomalacia appears normal. No cortically based acute infarct identified. Vascular: Calcified atherosclerosis at the skull base. No suspicious intracranial vascular hyperdensity. Skull: The visible left orbital walls appear stable and intact. No acute osseous abnormality identified. Sinuses/Orbits: Visualized paranasal sinuses and mastoids are stable and well pneumatized. Other: 16 MM thick broad-based hematoma along the left superolateral rim of the orbit and involving the preseptal space. The left globe appears to remain intact. The visible left postseptal and right orbit soft tissues appear normal. Hematoma  tracks up the left lateral forehead scalp soft tissues. Other scalp soft tissues appear normal. IMPRESSION: 1. Small right lateral subdural hematoma measuring 3-4 mm in thickness. No midline shift or significant intracranial mass effect at this time. 2. No associated skull or visible right orbital wall fracture. Left periorbital and forehead superficial soft tissue hematoma. 3. No hemorrhagic contusion or other acute traumatic injury to the brain identified. 4. Right superior frontal lobe encephalomalacia related to the June 2018 intra-axial hemorrhage. Electronically Signed: By: Genevie Ann M.D. On: 10/18/2017 15:35    Procedures .Critical Care Performed by: Sherwood Gambler, MD Authorized by: Sherwood Gambler, MD   Critical care provider statement:    Critical care time (minutes):  30   Critical care time was exclusive of:  Separately billable procedures and treating other patients   Critical care was necessary to treat or prevent imminent or life-threatening deterioration of the following conditions:  CNS failure or compromise   Critical care was time spent personally by me on the following activities:  Development of treatment plan with patient or surrogate, discussions with consultants, evaluation of patient's response to treatment, examination of patient, obtaining history from patient or surrogate, ordering and performing treatments and interventions, ordering and review of laboratory studies, ordering and review of radiographic studies, pulse oximetry, re-evaluation of patient's condition and review of old charts   (including critical care time)  Medications Ordered in ED Medications  fentaNYL (SUBLIMAZE) injection 50 mcg (has no administration in time range)     Initial Impression / Assessment and Plan / ED Course  I have reviewed the triage vital signs and the nursing notes.  Pertinent labs & imaging results that were available during my care of the patient were reviewed by me and  considered in my medical decision making (see chart for details).  CT head shows small subdural hematoma.  She is neurovascularly intact compared to her baseline from residual deficits.  I discussed with neurosurgery, Dr. Annette Stable, who has reviewed CT.  He feels like this it could be residual from her prior head bleed rather than a new subdural.  However she will need a CT head in 24 hours.  He feels like this could be done as an outpatient but I think it is more likely this will be difficult to do and thus will observe her in the hospital for monitoring and repeat head CT.  Final Clinical Impressions(s) / ED Diagnoses   Final diagnoses:  Subdural hematoma [facility])    ED Discharge Orders    None       Sherwood Gambler, MD 10/19/17 [phone]

## 2017-10-18 NOTE — Progress Notes (Signed)
Pt gave permission for her sister in law to remain in the room while questions were asked on the nursing admission history. Lucius Conn BSN, RN-BC Admissions RN 10/18/2017 6:22 PM

## 2017-10-18 NOTE — ED Notes (Signed)
ED TO INPATIENT HANDOFF REPORT  Name/Age/Gender Terry Juarez 52 y.o. female  Code Status Code Status History    Date Active Date Inactive Code Status Order ID Comments User Context   01/04/2017 1950 02/01/2017 1314 Full Code [id number]  Elizabeth Sauer Inpatient   12/19/2016 2216 12/31/2016 1315 Full Code [id number]  Reyne Dumas, MD ED   12/19/2016 2139 12/19/2016 2216 Full Code [id number]  Reyne Dumas, MD ED   07/29/2016 2332 08/02/2016 2129 Full Code [id number]  Toy Baker, MD Inpatient   02/12/2015 0450 02/12/2015 2021 Full Code [id number]  Ivor Costa, MD ED   06/14/2014 1011 06/15/2014 0349 Full Code [id number]  Burman Riis, MD [facility]      Home/SNF/Other Home  Chief Complaint fall/left eye injury  Level of Care/Admitting Diagnosis ED Disposition    ED Disposition Condition [facility] Area: [facility] [[id number]]  Level of Care: Telemetry [5]  Diagnosis: Subarachnoid hemorrhage ([place]) [430.ICD-9-CM]  Admitting Physician: Cristy Folks [[id number]]  Attending Physician: Cristy Folks [[id number]]  PT Class (Do Not Modify): Observation [104]  PT Acc Code (Do Not Modify): Observation [10022]       Medical History Past Medical History:  Diagnosis Date  . Anemia   . Bruises easily   . Dialysis patient ([place])   . ESRD on dialysis ([place])   . Hyperlipidemia   . Hypertension   . Renal disorder    rt renal mass / < functioning of left kidney - being prepared for possible dialysis  . Right renal mass   . Stroke [facility])     Allergies Allergies  Allergen Reactions  . Lisinopril Cough    IV Location/Drains/Wounds Patient Lines/Drains/Airways Status   Active Line/Drains/Airways    Name:   Placement date:   Placement time:   Site:   Days:   Peripheral IV 10/18/17 Left Wrist   10/18/17    1615    Wrist   less than 1   Fistula / Graft Right  Arteriovenous fistula   07/11/14    1354    -   1195   Fistula / Graft Right Upper  arm Arteriovenous vein graft   -    -    Upper arm      Hemodialysis Catheter Right Internal jugular Double-lumen   07/11/14    1325    Internal jugular   1195   Incision (Closed) 07/11/14 Arm Right   07/11/14    1255     1195   Incision (Closed) 07/11/14 Chest Right   07/11/14    1255     1195   Incision (Closed) 07/11/14 Neck Right   07/11/14    1255     1195   Incision (Closed) 07/11/14 Arm Right   07/11/14    1348     1195   Incision (Closed) 07/25/14 Abdomen Right   07/25/14    1011     1181   Incision - 3 Ports Abdomen 1: Umbilicus 2: Right;Lateral;Upper 3: Umbilicus;Upper   07/25/14    -     1181          Labs/Imaging Results for orders placed or performed during the hospital encounter of 10/18/17 (from the past 48 hour(s))  Basic metabolic panel     Status: Abnormal   Collection Time: 10/18/17  4:12 PM  Result Value Ref Range   Sodium 144 135 - 145 mmol/L   Potassium 3.6 3.5 -  5.1 mmol/L   Chloride 99 (L) 101 - 111 mmol/L   CO2 24 22 - 32 mmol/L   Glucose, Bld 88 65 - 99 mg/dL   BUN 55 (H) 6 - 20 mg/dL   Creatinine, Ser 14.02 (H) 0.44 - 1.00 mg/dL   Calcium 8.8 (L) 8.9 - 10.3 mg/dL   GFR calc non Af Amer 3 (L) >60 mL/min   GFR calc Af Amer 3 (L) >60 mL/min    Comment: (NOTE) The eGFR has been calculated using the CKD EPI equation. This calculation has not been validated in all clinical situations. eGFR's persistently <60 mL/min signify possible Chronic Kidney Disease.    Anion gap 21 (H) 5 - 15    Comment: REPEATED TO VERIFY Performed at [facility], [place] [address], [place], [place] [address]   CBC with Differential     Status: Abnormal   Collection Time: 10/18/17  4:12 PM  Result Value Ref Range   WBC 12.6 (H) 4.0 - 10.5 K/uL   RBC 3.50 (L) 3.87 - 5.11 MIL/uL   Hemoglobin 11.3 (L) 12.0 - 15.0 g/dL   HCT 38.3 36.0 - 46.0 %   MCV 109.4 (H) 78.0 - 100.0 fL   MCH 32.3 26.0 - 34.0 pg   MCHC 29.5 (L) 30.0 - 36.0 g/dL   RDW 18.7 (H) 11.5 - 15.5 %    Platelets 397 150 - 400 K/uL   Neutrophils Relative % 70 %   Neutro Abs 8.9 (H) 1.7 - 7.7 K/uL   Lymphocytes Relative 13 %   Lymphs Abs 1.6 0.7 - 4.0 K/uL   Monocytes Relative 14 %   Monocytes Absolute 1.7 (H) 0.1 - 1.0 K/uL   Eosinophils Relative 2 %   Eosinophils Absolute 0.3 0.0 - 0.7 K/uL   Basophils Relative 1 %   Basophils Absolute 0.1 0.0 - 0.1 K/uL    Comment: Performed at [facility], [place] [name] [place], [place] [address]  Protime-INR     Status: None   Collection Time: 10/18/17  4:12 PM  Result Value Ref Range   Prothrombin Time 14.0 11.4 - 15.2 seconds   INR 1.09     Comment: Performed at [facility], [place] [address] [place], [place] [address]   Ct Head Wo Contrast  Addendum Date: 10/18/2017   ADDENDUM REPORT: 10/18/2017 15:50 ADDENDUM: Critical Value/emergent results were called by telephone at the time of interpretation on 10/18/2017 at 1539 hours to Dr. Addison Lank , who verbally acknowledged these results. Electronically Signed   By: Genevie Ann M.D.   On: 10/18/2017 15:50   Result Date: 10/18/2017 CLINICAL DATA:  52 year old female with acute onset lower extremity weakness causing a fall. Blunt trauma to the left eye with pain and swelling. History of PRES and intracranial hemorrhage in June 2018. EXAM: CT HEAD WITHOUT CONTRAST TECHNIQUE: Contiguous axial images were obtained from the base of the skull through the vertex without intravenous contrast. COMPARISON:  Head CT 12/24/2016 and earlier. FINDINGS: Brain: Mixed density but mostly hyperdense right side subdural hematoma measuring 3-4 millimeters in thickness (series 5, image 42) is new since the 2018 comparison. The hematoma is mildly lobulated over the right parietal convexity. No midline shift. No hemorrhagic contusion or other acute intracranial hemorrhage identified. Encephalomalacia with dystrophic calcification at the right superior frontal gyrus corresponding to the area  of intra-axial hemorrhage in 2018. The ventricle system is within normal limits. Basilar cisterns are patent. Gray-white matter differentiation outside  of the area of right superior frontal lobe encephalomalacia appears normal. No cortically based acute infarct identified. Vascular: Calcified atherosclerosis at the skull base. No suspicious intracranial vascular hyperdensity. Skull: The visible left orbital walls appear stable and intact. No acute osseous abnormality identified. Sinuses/Orbits: Visualized paranasal sinuses and mastoids are stable and well pneumatized. Other: 16 MM thick broad-based hematoma along the left superolateral rim of the orbit and involving the preseptal space. The left globe appears to remain intact. The visible left postseptal and right orbit soft tissues appear normal. Hematoma tracks up the left lateral forehead scalp soft tissues. Other scalp soft tissues appear normal. IMPRESSION: 1. Small right lateral subdural hematoma measuring 3-4 mm in thickness. No midline shift or significant intracranial mass effect at this time. 2. No associated skull or visible right orbital wall fracture. Left periorbital and forehead superficial soft tissue hematoma. 3. No hemorrhagic contusion or other acute traumatic injury to the brain identified. 4. Right superior frontal lobe encephalomalacia related to the June 2018 intra-axial hemorrhage. Electronically Signed: By: Genevie Ann M.D. On: 10/18/2017 15:35    Pending Labs Unresulted Labs (From admission, onward)   Start     Ordered   10/19/17 0500  Renal function panel  Tomorrow morning,   R     10/18/17 1908   10/19/17 0500  CBC  Tomorrow morning,   R     10/18/17 1908   Signed and Held  Basic metabolic panel  Tomorrow morning,   R     Signed and Held   Signed and Held  CBC  Tomorrow morning,   R     Signed and Held      Vitals/Pain Today's Vitals   10/18/17 1730 10/18/17 1800 10/18/17 1805 10/18/17 1900  BP: (!) 166/96 (!) 164/94  (!)  152/86  Pulse: 77 81  77  Resp: _0 Temp:      TempSrc:      SpO2: 100% 100%  100%  Weight:      Height:      PainSc:   7      Isolation Precautions No active isolations  Medications Medications  heparin 1000 unit/ml injection 500 Units (has no administration in time range)  gentamicin cream (GARAMYCIN) 0.1 % 1 application (has no administration in time range)  dialysis solution 1.5% low-MG/low-CA dianeal solution (has no administration in time range)  fentaNYL (SUBLIMAZE) injection 50 mcg (50 mcg Intravenous Given 10/18/17 1621)    Mobility walks

## 2017-10-18 NOTE — Progress Notes (Signed)
Pt arrived from [facility] to Rm 3w23. Oriented to the rm. Bed alarm. Bed alarm on. Call bell in reach.

## 2017-10-18 NOTE — ED Triage Notes (Addendum)
Patient reports that she was walking in her house and her legs gave out causing her to fall. Patient states she hit her left eye on the floor.Patient did not have LOC. Patient denies blood thinners. patient has pain and swelling to the left eye. Patient also reports right hip pain/hematoma

## 2017-10-18 NOTE — Consult Note (Signed)
[facility] Renal Consultation Note    Indication for Consultation:  Management of ESRD/hemodialysis; anemia, hypertension/volume and secondary hyperparathyroidism  HPI: Terry Juarez is a 52 y.o. female.  Terry Juarez is a 52yo AAF with PMH significant for HTN, ESRD on CCPD, anemia of CKD, hyperlipidemia, hypothyroidism, h/o ICH related to PRES with chronic left-sided hemiparesis who was brought to [facility] after sustaining a fall at home.  She reports that she got up to go to the bathroom and the next thing she remembers is being helped up from the floor.  She has a large ecchymosis over her left eye with significant swelling.  She does not remember the fall and there is some question about loss of consciousness.  She underwent CT scan of the head revealed right-sided subdural hematoma of 3-4 mm.  Neurosurgery was consulted and felt possibly related to her previous bleed with extension but will be admitted under observation so as to re-evaluate CT scan in 12-24 hours.  We were consulted to help provide CCPD while she is in the hospital.  She feels better and denies any palpitations/sob/N/V/D or diaphoresis prior to her fall.  Past Medical History:  Diagnosis Date  . Anemia   . Bruises easily   . Dialysis patient ([place])   . ESRD on dialysis ([place])   . Hyperlipidemia   . Hypertension   . Renal disorder    rt renal mass / < functioning of left kidney - being prepared for possible dialysis  . Right renal mass   . Stroke [facility])    Past Surgical History:  Procedure Laterality Date  . AV FISTULA PLACEMENT Right 07/11/2014   Procedure: ARTERIOVENOUS (AV) FISTULA CREATION RIGHT ARM BRACHIO-CEPHALIC WITH ATTEMPTED RADIO-CEPHALIC (AV) FISTULA;  Surgeon: Mal Misty, MD;  Location: [place];  Service: Vascular;  Laterality: Right;  . COLONOSCOPY WITH PROPOFOL N/A 09/04/2016   Procedure: COLONOSCOPY WITH PROPOFOL;  Surgeon: Carol Ada, MD;  Location: [facility] ENDOSCOPY;  Service: Endoscopy;   Laterality: N/A;  . ECTOPIC PREGNANCY SURGERY  1987  . ESOPHAGOGASTRODUODENOSCOPY N/A 07/30/2016   Procedure: ESOPHAGOGASTRODUODENOSCOPY (EGD);  Surgeon: Carol Ada, MD;  Location: [facility] ENDOSCOPY;  Service: Endoscopy;  Laterality: N/A;  Bedside  . ESOPHAGOGASTRODUODENOSCOPY (EGD) WITH PROPOFOL N/A 09/04/2016   Procedure: ESOPHAGOGASTRODUODENOSCOPY (EGD) WITH PROPOFOL;  Surgeon: Carol Ada, MD;  Location: [facility] ENDOSCOPY;  Service: Endoscopy;  Laterality: N/A;  . ESOPHAGOGASTRODUODENOSCOPY (EGD) WITH PROPOFOL N/A 08/27/2017   Procedure: ESOPHAGOGASTRODUODENOSCOPY (EGD) WITH PROPOFOL;  Surgeon: Carol Ada, MD;  Location: [facility];  Service: Endoscopy;  Laterality: N/A;  . INSERTION OF DIALYSIS CATHETER Right 07/11/2014   Procedure: INSERTION OF DIALYSIS CATHETER IN RIGHT INTERNAL JUGULAR ;  Surgeon: Mal Misty, MD;  Location: [place];  Service: Vascular;  Laterality: Right;  . LAPAROSCOPIC NEPHRECTOMY Right 07/25/2014   Procedure: RIGHT LAPAROSCOPIC RADICAL NEPHRECTOMY ;  Surgeon: Ardis Hughs, MD;  Location: [facility] ORS;  Service: Urology;  Laterality: Right;  . PATCH ANGIOPLASTY Right 07/11/2014   Procedure: PATCH ANGIOPLASTY OF RIGHT RADIAL ARTERY USING CEPHALIC VEIN.;  Surgeon: Mal Misty, MD;  Location: [place];  Service: Vascular;  Laterality: Right;  . THROMBECTOMY W/ EMBOLECTOMY Right 07/11/2014   Procedure: THROMBECTOMY OF RIGHT RADIAL ARTERY  ;  Surgeon: Mal Misty, MD;  Location: [facility] OR;  Service: Vascular;  Laterality: Right;   Family History:   Family History  Problem Relation Age of Onset  . Throat cancer Mother        smoked  . Hypertension  Father    Social History:  reports that she has never smoked. She has never used smokeless tobacco. She reports that she drinks about 1.2 oz of alcohol per week. She reports that she does not use drugs. Allergies  Allergen Reactions  . Lisinopril Cough   Prior to Admission medications   Medication Sig Start Date End Date Taking?  Authorizing Provider  acetaminophen (TYLENOL) 500 MG tablet Take 1,000 mg by mouth every 6 (six) hours as needed for moderate pain or headache.   Yes [provider]  amLODipine (NORVASC) 10 MG tablet Take 1 tablet (10 mg total) by mouth daily. 02/01/17  Yes Love, Ivan Anchors, PA-C  aspirin EC 81 MG tablet Take 1 tablet (81 mg total) by mouth daily. 04/08/17  Yes Rosalin Hawking, MD  calcium acetate (PHOSLO) 667 MG capsule Take 2 capsules (1,334 mg total) by mouth 3 (three) times daily with meals. Patient taking differently: Take 1,334 mg by mouth See admin instructions. Take 2 capsules (1,334 mg) by mouth up to four times daily - with meals and snacks 02/01/17  Yes Love, Pamela S, PA-C  carvedilol (COREG) 12.5 MG tablet Take 25 mg by mouth 2 (two) times daily. 08/09/17  Yes [provider]  cinacalcet (SENSIPAR) 60 MG tablet Take 60 mg by mouth every Monday, Wednesday, and Friday.    Yes [provider]  famotidine (PEPCID) 20 MG tablet Take 1 tablet (20 mg total) by mouth daily. 02/01/17  Yes Love, Ivan Anchors, PA-C  FLUoxetine (PROZAC) 20 MG capsule Take 1 capsule (20 mg total) by mouth daily. 02/01/17  Yes Love, Ivan Anchors, PA-C  gentamicin cream (GARAMYCIN) 0.1 % Apply 1 application topically daily. 02/01/17  Yes Love, Ivan Anchors, PA-C  heparin 1000 UNIT/ML injection Inject 30 mLs into the vein as needed. Patient said she only has to use this when she sees fiber in her catheter 09/14/17  Yes [provider]  levETIRAcetam (KEPPRA) 500 MG tablet Take 1 tablet (500 mg total) by mouth 2 (two) times daily. 02/01/17  Yes Love, Ivan Anchors, PA-C  levothyroxine (SYNTHROID, LEVOTHROID) 88 MCG tablet Take 1 tablet (88 mcg total) by mouth daily before breakfast. 02/01/17  Yes Love, Ivan Anchors, PA-C  multivitamin (RENA-VIT) TABS tablet Take 1 tablet by mouth daily.   Yes [provider]  pantoprazole (PROTONIX) 40 MG tablet Take 1 tablet (40 mg total) by mouth 2 (two) times daily. 02/01/17   Yes Love, Ivan Anchors, PA-C  amoxicillin-clavulanate (AUGMENTIN) 875-125 MG tablet Take 1 tablet by mouth 2 (two) times daily. Patient not taking: Reported on 10/18/2017 09/25/17   Dalia Heading, PA-C  oxyCODONE-acetaminophen (PERCOCET/ROXICET) 5-325 MG tablet Take 1 tablet by mouth every 6 (six) hours as needed for severe pain. Patient not taking: Reported on 10/18/2017 10/03/17   Drenda Freeze, MD   Current Facility-Administered Medications  Medication Dose Route Frequency Provider Last Rate Last Dose  . dialysis solution 1.5% low-MG/low-CA dianeal solution   Intraperitoneal 5 X Daily Melba Araki, MD      . gentamicin cream (GARAMYCIN) 0.1 % 1 application  1 application Topical Daily Dylon Correa, MD      . heparin 1000 unit/ml injection 500 Units  500 Units Intraperitoneal PRN Donato Heinz, MD       Current Outpatient Medications  Medication Sig Dispense Refill  . acetaminophen (TYLENOL) 500 MG tablet Take 1,000 mg by mouth every 6 (six) hours as needed for moderate pain or headache.    Marland Kitchen amLODipine (  NORVASC) 10 MG tablet Take 1 tablet (10 mg total) by mouth daily. 30 tablet 0  . aspirin EC 81 MG tablet Take 1 tablet (81 mg total) by mouth daily.    . calcium acetate (PHOSLO) 667 MG capsule Take 2 capsules (1,334 mg total) by mouth 3 (three) times daily with meals. (Patient taking differently: Take 1,334 mg by mouth See admin instructions. Take 2 capsules (1,334 mg) by mouth up to four times daily - with meals and snacks) 180 capsule 0  . carvedilol (COREG) 12.5 MG tablet Take 25 mg by mouth 2 (two) times daily.  0  . cinacalcet (SENSIPAR) 60 MG tablet Take 60 mg by mouth every Monday, Wednesday, and Friday.     . famotidine (PEPCID) 20 MG tablet Take 1 tablet (20 mg total) by mouth daily. 30 tablet 0  . FLUoxetine (PROZAC) 20 MG capsule Take 1 capsule (20 mg total) by mouth daily. 30 capsule 0  . gentamicin cream (GARAMYCIN) 0.1 % Apply 1 application topically daily.  30 g 3  . heparin 1000 UNIT/ML injection Inject 30 mLs into the vein as needed. Patient said she only has to use this when she sees fiber in her catheter  99  . levETIRAcetam (KEPPRA) 500 MG tablet Take 1 tablet (500 mg total) by mouth 2 (two) times daily. 60 tablet 0  . levothyroxine (SYNTHROID, LEVOTHROID) 88 MCG tablet Take 1 tablet (88 mcg total) by mouth daily before breakfast. 30 tablet 0  . multivitamin (RENA-VIT) TABS tablet Take 1 tablet by mouth daily.    . pantoprazole (PROTONIX) 40 MG tablet Take 1 tablet (40 mg total) by mouth 2 (two) times daily. 60 tablet 0  . amoxicillin-clavulanate (AUGMENTIN) 875-125 MG tablet Take 1 tablet by mouth 2 (two) times daily. (Patient not taking: Reported on 10/18/2017) 20 tablet 0  . oxyCODONE-acetaminophen (PERCOCET/ROXICET) 5-325 MG tablet Take 1 tablet by mouth every 6 (six) hours as needed for severe pain. (Patient not taking: Reported on 10/18/2017) 15 tablet 0   Labs: Basic Metabolic Panel: Recent Labs  Lab 10/18/17 1612  NA 144  K 3.6  CL 99*  CO2 24  GLUCOSE 88  BUN 55*  CREATININE 14.02*  CALCIUM 8.8*   Liver Function Tests: No results for input(s): AST, ALT, ALKPHOS, BILITOT, PROT, ALBUMIN in the last 168 hours. No results for input(s): LIPASE, AMYLASE in the last 168 hours. No results for input(s): AMMONIA in the last 168 hours. CBC: Recent Labs  Lab 10/18/17 1612  WBC 12.6*  NEUTROABS 8.9*  HGB 11.3*  HCT 38.3  MCV 109.4*  PLT 397   Cardiac Enzymes: No results for input(s): CKTOTAL, CKMB, CKMBINDEX, TROPONINI in the last 168 hours. CBG: No results for input(s): GLUCAP in the last 168 hours. Iron Studies: No results for input(s): IRON, TIBC, TRANSFERRIN, FERRITIN in the last 72 hours. Studies/Results: Ct Head Wo Contrast  Addendum Date: 10/18/2017   ADDENDUM REPORT: 10/18/2017 15:50 ADDENDUM: Critical Value/emergent results were called by telephone at the time of interpretation on 10/18/2017 at 1539 hours to Dr.  Addison Lank , who verbally acknowledged these results. Electronically Signed   By: Genevie Ann M.D.   On: 10/18/2017 15:50   Result Date: 10/18/2017 CLINICAL DATA:  52 year old female with acute onset lower extremity weakness causing a fall. Blunt trauma to the left eye with pain and swelling. History of PRES and intracranial hemorrhage in June 2018. EXAM: CT HEAD WITHOUT CONTRAST TECHNIQUE: Contiguous axial images were obtained from the base  of the skull through the vertex without intravenous contrast. COMPARISON:  Head CT 12/24/2016 and earlier. FINDINGS: Brain: Mixed density but mostly hyperdense right side subdural hematoma measuring 3-4 millimeters in thickness (series 5, image 42) is new since the 2018 comparison. The hematoma is mildly lobulated over the right parietal convexity. No midline shift. No hemorrhagic contusion or other acute intracranial hemorrhage identified. Encephalomalacia with dystrophic calcification at the right superior frontal gyrus corresponding to the area of intra-axial hemorrhage in 2018. The ventricle system is within normal limits. Basilar cisterns are patent. Gray-white matter differentiation outside of the area of right superior frontal lobe encephalomalacia appears normal. No cortically based acute infarct identified. Vascular: Calcified atherosclerosis at the skull base. No suspicious intracranial vascular hyperdensity. Skull: The visible left orbital walls appear stable and intact. No acute osseous abnormality identified. Sinuses/Orbits: Visualized paranasal sinuses and mastoids are stable and well pneumatized. Other: 16 MM thick broad-based hematoma along the left superolateral rim of the orbit and involving the preseptal space. The left globe appears to remain intact. The visible left postseptal and right orbit soft tissues appear normal. Hematoma tracks up the left lateral forehead scalp soft tissues. Other scalp soft tissues appear normal. IMPRESSION: 1. Small right lateral  subdural hematoma measuring 3-4 mm in thickness. No midline shift or significant intracranial mass effect at this time. 2. No associated skull or visible right orbital wall fracture. Left periorbital and forehead superficial soft tissue hematoma. 3. No hemorrhagic contusion or other acute traumatic injury to the brain identified. 4. Right superior frontal lobe encephalomalacia related to the June 2018 intra-axial hemorrhage. Electronically Signed: By: Genevie Ann M.D. On: 10/18/2017 15:35    ROS: Pertinent items are noted in HPI. Physical Exam: Vitals:   10/18/17 1149 10/18/17 1625 10/18/17 1730 10/18/17 1800  BP:  (!) 157/92 (!) 166/96 (!) 164/94  Pulse:  73 77 81  Resp:  15 16 16   Temp:      TempSrc:      SpO2:  98% 100% 100%  Weight: 52.2 kg (115 lb)     Height: 5\' 5"  (1.651 m)         Weight change:  No intake or output data in the 24 hours ending 10/18/17 1855 BP (!) 164/94   Pulse 81   Temp 98.2 F (36.8 C) (Oral)   Resp 16   Ht 5\' 5"  (1.651 m)   Wt 52.2 kg (115 lb)   SpO2 100%   BMI 19.14 kg/m  General appearance: alert, cooperative and no distress Head: large ecchymosis and swelling of her left eye Eyes: left eye closed due to lid edema/hematoma Resp: clear to auscultation bilaterally Cardio: regular rate and rhythm, no rub and transmitted flow murmer from her AVF GI: soft, non-tender; bowel sounds normal; no masses,  no organomegaly and PD catheter in place without erythema or drainage  Extremities: extremities normal, atraumatic, no cyanosis or edema and RUE AVF +T/B Dialysis Access:  Dialysis Orders: Center: [place] hometraining  on CCPD . 5 exchanges/night, fill time 10 minutes, dwell time 1:45, drain time 20 minutes, is dry during the day  Assessment/Plan: 1.  SDH - repeat CT scan in 12-24 hours per Neurosurgery 2.  ESRD -  Will continue with CCPD as per her outpatient orders 3.  Hypertension/volume  -  stable 4.  Anemia  - stable 5.  Metabolic bone disease -    Resume outpatient meds 6.  Nutrition -  Renal diet  Donetta Potts, MD [facility]  [place] Pager [phone] 10/18/2017, 6:55 PM

## 2017-10-19 ENCOUNTER — Observation Stay (HOSPITAL_COMMUNITY): Payer: Medicare Other

## 2017-10-19 DIAGNOSIS — N2581 Secondary hyperparathyroidism of renal origin: Secondary | ICD-10-CM | POA: Diagnosis not present

## 2017-10-19 DIAGNOSIS — S065X9A Traumatic subdural hemorrhage with loss of consciousness of unspecified duration, initial encounter: Secondary | ICD-10-CM

## 2017-10-19 DIAGNOSIS — I6783 Posterior reversible encephalopathy syndrome: Secondary | ICD-10-CM | POA: Diagnosis not present

## 2017-10-19 DIAGNOSIS — I120 Hypertensive chronic kidney disease with stage 5 chronic kidney disease or end stage renal disease: Secondary | ICD-10-CM | POA: Diagnosis not present

## 2017-10-19 DIAGNOSIS — D631 Anemia in chronic kidney disease: Secondary | ICD-10-CM | POA: Diagnosis not present

## 2017-10-19 DIAGNOSIS — Z992 Dependence on renal dialysis: Secondary | ICD-10-CM | POA: Diagnosis not present

## 2017-10-19 DIAGNOSIS — K659 Peritonitis, unspecified: Secondary | ICD-10-CM | POA: Diagnosis not present

## 2017-10-19 DIAGNOSIS — D509 Iron deficiency anemia, unspecified: Secondary | ICD-10-CM | POA: Diagnosis not present

## 2017-10-19 DIAGNOSIS — N186 End stage renal disease: Secondary | ICD-10-CM | POA: Diagnosis not present

## 2017-10-19 DIAGNOSIS — N2589 Other disorders resulting from impaired renal tubular function: Secondary | ICD-10-CM | POA: Diagnosis not present

## 2017-10-19 DIAGNOSIS — S065X1A Traumatic subdural hemorrhage with loss of consciousness of 30 minutes or less, initial encounter: Secondary | ICD-10-CM | POA: Diagnosis not present

## 2017-10-19 LAB — BASIC METABOLIC PANEL WITH GFR
BUN: 51 mg/dL — ABNORMAL HIGH (ref 6–20)
Creatinine, Ser: 14.04 mg/dL — ABNORMAL HIGH (ref 0.44–1.00)
GFR calc non Af Amer: 3 mL/min — ABNORMAL LOW (ref >60)
Sodium: 143 mmol/L (ref 135–145)

## 2017-10-19 LAB — CBC
HCT: 35 % — ABNORMAL LOW (ref 36.0–46.0)
Hemoglobin: 10.2 g/dL — ABNORMAL LOW (ref 12.0–15.0)
MCH: 31.5 pg (ref 26.0–34.0)
MCHC: 29.1 g/dL — ABNORMAL LOW (ref 30.0–36.0)
MCV: 108 fL — ABNORMAL HIGH (ref 78.0–100.0)
Platelets: 369 K/uL (ref 150–400)
RBC: 3.24 MIL/uL — ABNORMAL LOW (ref 3.87–5.11)
RDW: 18.3 % — ABNORMAL HIGH (ref 11.5–15.5)
WBC: 12.2 K/uL — ABNORMAL HIGH (ref 4.0–10.5)

## 2017-10-19 LAB — BASIC METABOLIC PANEL
Anion gap: 21 — ABNORMAL HIGH (ref 5–15)
CO2: 23 mmol/L (ref 22–32)
Calcium: 8.2 mg/dL — ABNORMAL LOW (ref 8.9–10.3)
Chloride: 99 mmol/L — ABNORMAL LOW (ref 101–111)
GFR calc Af Amer: 3 mL/min — ABNORMAL LOW (ref >60)
Glucose, Bld: 105 mg/dL — ABNORMAL HIGH (ref 65–99)
Potassium: 3.2 mmol/L — ABNORMAL LOW (ref 3.5–5.1)

## 2017-10-19 LAB — GLUCOSE, CAPILLARY: Glucose-Capillary: 98 mg/dL (ref 65–99)

## 2017-10-19 MED ORDER — DELFLEX-LC/1.5% DEXTROSE 344 MOSM/L IP SOLN
INTRAPERITONEAL | Status: DC
  Administered 2017-10-18: via INTRAPERITONEAL

## 2017-10-19 MED ORDER — NEPRO/CARBSTEADY PO LIQD
237.0000 mL | Freq: Three times a day (TID) | ORAL | Status: DC
Start: 2017-10-19 — End: 2017-10-19
  Filled 2017-10-19 (×4): qty 237

## 2017-10-19 MED ORDER — NEPRO/CARBSTEADY PO LIQD
237.0000 mL | Freq: Two times a day (BID) | ORAL | Status: DC
  Administered 2017-10-19: 237 mL via ORAL
  Filled 2017-10-19 (×3): qty 237

## 2017-10-19 NOTE — Progress Notes (Signed)
Patient went off unit for CT

## 2017-10-19 NOTE — Progress Notes (Signed)
Initial Nutrition Assessment  DOCUMENTATION CODES:   Not applicable  INTERVENTION:  Nepro Shake po BID, each supplement provides 425 kcal and 19 grams protein  NUTRITION DIAGNOSIS:   Increased nutrient needs related to chronic illness as evidenced by estimated needs.  GOAL:   Patient will meet greater than or equal to 90% of their needs  MONITOR:   PO intake, I & O's, Supplement acceptance  REASON FOR ASSESSMENT:   Malnutrition Screening Tool    ASSESSMENT:   Terry Juarez is a 52 yo female with PMH ESRD on PD nightly, HLD, PRES complicated by Terry Juarez with residual L-sided deficits who comes in after fall with subarachnoid hemorrhage.  Terry Juarez was resting comfortably at bedside. Normally eats eggs and [place] sausage for breakfast, a sandwich or baked chicken with rice and greens for lunch and chicken, veggies, and a starch for dinner. Also drinks NePro once a day. She reports a usual body weight of 115 pounds.  Per weight history she seems to fluctuate between 115-126 pounds. Ate some chicken and dumplings for lunch, did not like the asparagus. Appetite is good.   Labs reviewed:  K+ 3.2 Medications reviewed and include:  Phoslo, Colace, Rena-Vit D1.5 2L 5 exchanges daily --> Approximately 200-260 calories  NUTRITION - FOCUSED PHYSICAL EXAM:    Most Recent Value  Orbital Region  No depletion  Upper Arm Region  No depletion  Thoracic and Lumbar Region  No depletion  Buccal Region  No depletion  Temple Region  No depletion  Clavicle Bone Region  No depletion  Clavicle and Acromion Bone Region  No depletion  Scapular Bone Region  No depletion  Dorsal Hand  No depletion  Patellar Region  No depletion  Anterior Thigh Region  No depletion  Posterior Calf Region  No depletion       Diet Order:  Diet renal with fluid restriction Fluid restriction: 1200 mL Fluid; Room service appropriate? Yes; Fluid consistency: Thin  EDUCATION NEEDS:   Not appropriate for  education at this time  Skin:  Skin Assessment: Reviewed RN Assessment  Last BM:  PTA  Height:   Ht Readings from Last 1 Encounters:  10/18/17 5\' 5"  (1.651 m)    Weight:   Wt Readings from Last 1 Encounters:  10/19/17 123 lb 0.3 oz (55.8 kg)    Ideal Body Weight:  56.81 kg  BMI:  Body mass index is 20.47 kg/m.  Estimated Nutritional Needs:   Kcal:  1829-2100 calories  Protein:  68-78 grams (1.3-1.5g/kg)  Fluid:  UOP +1L   Terry Anis. [name], MS, RD LDN Inpatient Clinical Dietitian Pager [phone]

## 2017-10-19 NOTE — Progress Notes (Signed)
Patient D/C home, AVS reviewed, IV catheter removed intact

## 2017-10-19 NOTE — Progress Notes (Signed)
Staff arrived to take patient off PD.

## 2017-10-19 NOTE — Progress Notes (Addendum)
[facility] Progress Note   Subjective:   Feeling ok today.  Tolerated PD well overnight.   Objective Vitals:   10/19/17 0429 10/19/17 0815 10/19/17 1202 10/19/17 1250  BP: 124/82 123/85 120/73 (!) 100/58  Pulse: 75 76 76 75  Resp: 18 18 16 16   Temp:  98.1 F (36.7 C) 98.4 F (36.9 C) 98.7 F (37.1 C)  TempSrc:  Oral Oral Oral  SpO2: 98% 97% 97% 100%  Weight:    55.8 kg (123 lb 0.3 oz)  Height:       Physical Exam General:NAD, WDWN female, w/left sided weakness Heart:RRR, +9/5 systolic murmur Lungs:CTAB, no rales Abdomen:soft, NTND, PD cath in RLQ Extremities:no LE edema Dialysis Access: PD cath in RLQ - no erythema, drainage.   Filed Weights   10/18/17 1149 10/19/17 1250  Weight: 52.2 kg (115 lb) 55.8 kg (123 lb 0.3 oz)    Intake/Output Summary (Last 24 hours) at 10/19/2017 1323 Last data filed at 10/19/2017 1310 Gross per 24 hour  Intake 10132 ml  Output 1 ml  Net 10131 ml    Additional Objective Labs: Basic Metabolic Panel: Recent Labs  Lab 10/18/17 1612 10/19/17 0525  NA 144 143  K 3.6 3.2*  CL 99* 99*  CO2 24 23  GLUCOSE 88 105*  BUN 55* 51*  CREATININE 14.02* 14.04*  CALCIUM 8.8* 8.2*   CBC: Recent Labs  Lab 10/18/17 1612 10/19/17 0525  WBC 12.6* 12.2*  NEUTROABS 8.9*  --   HGB 11.3* 10.2*  HCT 38.3 35.0*  MCV 109.4* 108.0*  PLT 397 369   Blood Culture    Component Value Date/Time   SDES PERITONEAL 10/03/2017 1732   SPECREQUEST NONE 10/03/2017 1732   CULT  10/03/2017 1731    NO GROWTH 5 DAYS Performed at [facility], [place] [address] [place], [place] [address]    REPTSTATUS 10/11/2017 FINAL 10/03/2017 1732    CBG: Recent Labs  Lab 10/19/17 0427  GLUCAP 98    Lab Results  Component Value Date   INR 1.09 10/18/2017   INR 0.93 12/21/2016   INR 1.88 12/19/2016   Studies/Results: Ct Head Wo Contrast  Addendum Date: 10/18/2017   ADDENDUM REPORT: 10/18/2017 15:50 ADDENDUM: Critical Value/emergent  results were called by telephone at the time of interpretation on 10/18/2017 at 1539 hours to Dr. Addison Lank , who verbally acknowledged these results. Electronically Signed   By: Genevie Ann M.D.   On: 10/18/2017 15:50   Result Date: 10/18/2017 CLINICAL DATA:  52 year old female with acute onset lower extremity weakness causing a fall. Blunt trauma to the left eye with pain and swelling. History of PRES and intracranial hemorrhage in June 2018. EXAM: CT HEAD WITHOUT CONTRAST TECHNIQUE: Contiguous axial images were obtained from the base of the skull through the vertex without intravenous contrast. COMPARISON:  Head CT 12/24/2016 and earlier. FINDINGS: Brain: Mixed density but mostly hyperdense right side subdural hematoma measuring 3-4 millimeters in thickness (series 5, image 42) is new since the 2018 comparison. The hematoma is mildly lobulated over the right parietal convexity. No midline shift. No hemorrhagic contusion or other acute intracranial hemorrhage identified. Encephalomalacia with dystrophic calcification at the right superior frontal gyrus corresponding to the area of intra-axial hemorrhage in 2018. The ventricle system is within normal limits. Basilar cisterns are patent. Gray-white matter differentiation outside of the area of right superior frontal lobe encephalomalacia appears normal. No cortically based acute infarct identified. Vascular: Calcified atherosclerosis at the skull base. No  suspicious intracranial vascular hyperdensity. Skull: The visible left orbital walls appear stable and intact. No acute osseous abnormality identified. Sinuses/Orbits: Visualized paranasal sinuses and mastoids are stable and well pneumatized. Other: 16 MM thick broad-based hematoma along the left superolateral rim of the orbit and involving the preseptal space. The left globe appears to remain intact. The visible left postseptal and right orbit soft tissues appear normal. Hematoma tracks up the left lateral  forehead scalp soft tissues. Other scalp soft tissues appear normal. IMPRESSION: 1. Small right lateral subdural hematoma measuring 3-4 mm in thickness. No midline shift or significant intracranial mass effect at this time. 2. No associated skull or visible right orbital wall fracture. Left periorbital and forehead superficial soft tissue hematoma. 3. No hemorrhagic contusion or other acute traumatic injury to the brain identified. 4. Right superior frontal lobe encephalomalacia related to the June 2018 intra-axial hemorrhage. Electronically Signed: By: Genevie Ann M.D. On: 10/18/2017 15:35    Medications: . dialysis solution 1.5% low-MG/low-CA     . amLODipine  10 mg Oral Daily  . calcium acetate  1,334 mg Oral TID WC  . carvedilol  25 mg Oral BID  . cinacalcet  60 mg Oral Q M,W,F  . docusate sodium  100 mg Oral BID  . famotidine  20 mg Oral Daily  . FLUoxetine  20 mg Oral Daily  . gentamicin cream  1 application Topical Daily  . levETIRAcetam  500 mg Oral BID  . levothyroxine  88 mcg Oral QAC breakfast  . multivitamin  1 tablet Oral Daily  . pantoprazole  40 mg Oral BID    Dialysis Orders: Center: [place] [facility]  on CCPD . 5 exchanges/night, fill time 10 minutes, dwell time 1:45, drain time 20 minutes, is dry during the day    Assessment/Plan: 1. SDH - fall at home. Repeat CT scan today per neurosurgery to see if new event or resolution of her prior hemorrhage. 2. ESRD - on CCPD, continue per her regular OP orders. K 3.2.  3. Anemia of CKD- Hgb 10.2, follow trend. 4. Secondary hyperparathyroidism - Ca in goal. No P.  5. HTN/volume - euvolemic on exam, BP stable.  6. Nutrition - Alb 2.7. Renavite, Nepro TID.    Jen Mow, PA-C [facility] Pager: [phone] 10/19/2017,1:23 PM  LOS: 0 days   Pt seen, examined and agree w A/P as above. ESRD pt with very small SDH, no surgery planned per neurosurgery.  May be going home soon. Will follow.  Kelly Splinter  MD [employer] pager [phone]   10/19/2017, 1:52 PM

## 2017-10-19 NOTE — Discharge Summary (Signed)
Physician Discharge Summary  Terry Juarez QMG:[id number] DOB: 09-29-1965 DOA: 10/18/2017  PCP: Terry Pao, MD  Admit date: 10/18/2017 Discharge date: 10/19/2017  Admitted From: Home  Disposition:  Home   Recommendations for Outpatient Follow-up:  1. Follow up with PCP in 1-2 weeks  Home Health: None  Equipment/Devices: None  Discharge Condition: Good  CODE STATUS: FULL Diet recommendation: Cardiac  Brief/Interim Summary: Terry Juarez is a 52 y.o. 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  Was in bathroom, tripped, fell and struck the left side of her face, without LOC.  Presented to the ER, where routine exam with CT head showed evidence of small SDH without mass effect.  Case was discussed with Neurosurgery who recommended repeat imaging in 24 hours.       Discharge Diagnoses:    Subdural hematoma Repeat imaging at 24 hours showed no change.  Case was discussed with Dr. Annette Stable who felt this was most likely the natural evolution of her bleed from last summer, not actual new bleeding. We agreed that resuming aspirin was reasonable.  No specific Neurologic follow up needed.  Return precautions given.  Patient in normal health, comfortable for discharge.      Discharge Instructions  Discharge Instructions    Diet general   Complete by:  As directed    Discharge instructions   Complete by:  As directed    From Dr. Loleta Books: You were admitted after your fall because your head images (the head CT) showed small area of bleeding.  When our neurosurgeon reviewed the images, he felt this was possibly an old collection from her intracerebral hemorrhage from last summer (your stroke).  Today, your repeat head CT showed no change to the area, suggesting that this was indeed just the remainders of the old bleeding episode.  In time, this should continue to resolve.  If you have new or  worsening headaches, new or worsening numbness or weakness on the left side, return to the hospital.   Increase activity slowly   Complete by:  As directed      Allergies as of 10/19/2017      Reactions   Lisinopril Cough      Medication List    TAKE these medications   acetaminophen 500 MG tablet Commonly known as:  TYLENOL Take 1,000 mg by mouth every 6 (six) hours as needed for moderate pain or headache.   amLODipine 10 MG tablet Commonly known as:  NORVASC Take 1 tablet (10 mg total) by mouth daily.   amoxicillin-clavulanate 875-125 MG tablet Commonly known as:  AUGMENTIN Take 1 tablet by mouth 2 (two) times daily.   aspirin EC 81 MG tablet Take 1 tablet (81 mg total) by mouth daily.   calcium acetate 667 MG capsule Commonly known as:  PHOSLO Take 2 capsules (1,334 mg total) by mouth 3 (three) times daily with meals. What changed:    when to take this  additional instructions   carvedilol 12.5 MG tablet Commonly known as:  COREG Take 25 mg by mouth 2 (two) times daily.   cinacalcet 60 MG tablet Commonly known as:  SENSIPAR Take 60 mg by mouth every Monday, Wednesday, and Friday.   famotidine 20 MG tablet Commonly known as:  PEPCID Take 1 tablet (20 mg total) by mouth daily.   FLUoxetine 20 MG capsule Commonly known as:  PROZAC Take 1 capsule (20 mg total) by mouth daily.  gentamicin cream 0.1 % Commonly known as:  GARAMYCIN Apply 1 application topically daily.   heparin 1000 UNIT/ML injection Inject 30 mLs into the vein as needed. Patient said she only has to use this when she sees fiber in her catheter   levETIRAcetam 500 MG tablet Commonly known as:  KEPPRA Take 1 tablet (500 mg total) by mouth 2 (two) times daily.   levothyroxine 88 MCG tablet Commonly known as:  SYNTHROID, LEVOTHROID Take 1 tablet (88 mcg total) by mouth daily before breakfast.   multivitamin Tabs tablet Take 1 tablet by mouth daily.   oxyCODONE-acetaminophen 5-325 MG  tablet Commonly known as:  PERCOCET/ROXICET Take 1 tablet by mouth every 6 (six) hours as needed for severe pain.   pantoprazole 40 MG tablet Commonly known as:  PROTONIX Take 1 tablet (40 mg total) by mouth 2 (two) times daily.       Allergies  Allergen Reactions  . Lisinopril Cough    Consultations:  Neurosurgery   Procedures/Studies: Ct Abdomen Pelvis Wo Contrast  Result Date: 10/03/2017 CLINICAL DATA:  Lower abdominal pain for 3 weeks. EXAM: CT ABDOMEN AND PELVIS WITHOUT CONTRAST TECHNIQUE: Multidetector CT imaging of the abdomen and pelvis was performed following the standard protocol without IV contrast. COMPARISON:  CT scan of September 24, 2017. FINDINGS: Lower chest: No acute abnormality. Hepatobiliary: No focal liver abnormality is seen. No gallstones, gallbladder wall thickening, or biliary dilatation. Pancreas: Unremarkable. No pancreatic ductal dilatation or surrounding inflammatory changes. Spleen: Normal in size without focal abnormality. Adrenals/Urinary Tract: Status post right nephrectomy. Left renal atrophy is noted consistent with end-stage renal disease. No hydronephrosis or renal obstruction is noted. Urinary bladder is decompressed. Stomach/Bowel: Stomach is within normal limits. Appendix appears normal. No evidence of bowel wall thickening, distention, or inflammatory changes. Vascular/Lymphatic: Aortic atherosclerosis. No enlarged abdominal or pelvic lymph nodes. Reproductive: Uterus and bilateral adnexa are unremarkable. Other: Distal tip of peritoneal dialysis catheter is seen in the pelvis anteriorly. Mild epigastric pneumoperitoneum is noted most consistent with peritoneal dialysis. Minimal free fluid is noted in the pelvis. Musculoskeletal: No acute or significant osseous findings. IMPRESSION: Mild epigastric pneumoperitoneum is again noted most consistent with history of peritoneal dialysis. Status post right nephrectomy. No acute abnormality seen in the abdomen or  pelvis. Aortic Atherosclerosis (ICD10-I70.0). Electronically Signed   By: Marijo Conception, M.D.   On: 10/03/2017 18:45   Ct Abdomen Pelvis Wo Contrast  Result Date: 09/24/2017 CLINICAL DATA:  Lower abdominal pain for 1 week EXAM: CT ABDOMEN AND PELVIS WITHOUT CONTRAST TECHNIQUE: Multidetector CT imaging of the abdomen and pelvis was performed following the standard protocol without IV contrast. COMPARISON:  07/29/2016, 07/08/2015 FINDINGS: Lower chest: Lung bases demonstrate no acute consolidation or effusion. Linear atelectasis or scar in the anterior left lung base. Heart size upper normal. Hepatobiliary: No focal liver abnormality is seen. No gallstones, gallbladder wall thickening, or biliary dilatation. Pancreas: Unremarkable. No pancreatic ductal dilatation or surrounding inflammatory changes. Spleen: Normal in size without focal abnormality. Adrenals/Urinary Tract: Adrenal glands are within normal limits. Status post right nephrectomy. Hyperdense nodule mid left kidney decreased in size. Additional cortical hypodensities left kidney. No hydronephrosis. Bladder normal. Stomach/Bowel: Stomach nonenlarged. No dilated small bowel. Normal appendix. Possible mild wall thickening involving the descending colon. Vascular/Lymphatic: Moderate aortic atherosclerosis. No aneurysmal dilatation. No significantly enlarged lymph nodes. Reproductive: Uterus and bilateral adnexa are unremarkable. Other: Trace free fluid in the pelvis. Multiple locules of intraperitoneal air mostly in the upper abdomen. Peritoneal dialysis catheter in  the right abdomen, tip is coiled in the right pelvis. Musculoskeletal: No acute or significant osseous findings. IMPRESSION: 1. Possible mild wall thickening involving the descending colon, query mild colitis. Otherwise no definite acute abnormalities are seen 2. Small amount of pneumoperitoneum with peritoneal dialysis catheter in place 3. Trace free fluid in the pelvis 4. Status post right  nephrectomy Electronically Signed   By: Donavan Foil M.D.   On: 09/24/2017 23:15   Dg Chest 2 View  Result Date: 09/24/2017 CLINICAL DATA:  Lower abdominal pain 1 week worse today with episode of nausea and vomiting. Also intermittent chest pain. EXAM: CHEST - 2 VIEW COMPARISON:  12/25/2016 as well as abdominal film 08/02/2017 FINDINGS: Lungs are adequately inflated without focal consolidation or effusion. Possible nodule opacity over the left upper lobe projected in the region of the anterior left second rib. Possible small nodule opacity over the left base. Cardiomediastinal silhouette is within normal. There is calcified plaque over the aortic arch. Possible small amount of free subdiaphragmatic air bilaterally. IMPRESSION: No acute cardiopulmonary disease. Possible nodule opacities over the left upper lobe and left base. Recommend noncontrast chest CT for further evaluation. Possible small amount of free subdiaphragmatic air. This is likely related patient's peritoneal dialysis as recommend clinical correlation. Electronically Signed   By: Marin Olp M.D.   On: 09/24/2017 16:07   Ct Head Wo Contrast  Result Date: 10/19/2017 CLINICAL DATA:  52 year old female with a history of prior intracranial hemorrhage 12/24/2016 with posterior reversible encephalopathy syndrome. Current admission with right-sided subdural hemorrhage EXAM: CT HEAD WITHOUT CONTRAST TECHNIQUE: Contiguous axial images were obtained from the base of the skull through the vertex without intravenous contrast. COMPARISON:  10/18/2017 FINDINGS: Brain: Redemonstration of hyperdense extra-axial fluid overlying the right parietal region with the greatest thickness approximately 6 mm, unchanged. Axial images demonstrate the largest extent 2.8 cm, unchanged from prior. Blood products extend inferiorly towards the temporal region. No significant mass effect. No new acute hemorrhage. Redemonstration of focal hypodensity in the high right  cortical region with dystrophic calcifications, in the region of prior hemorrhage now resolved. Unremarkable appearance of the ventricular system. Gray-white differentiation relatively maintained. Vascular: Calcifications of the anterior circulation Skull: Soft tissue swelling in the left supraorbital region with hematoma on the lateral orbital margin, no larger than the comparison, measuring slightly smaller approximately 12 mm. No acute fracture. Sinuses/Orbits: No evidence of globe injury.  Unremarkable sinuses. Other: None IMPRESSION: Unchanged appearance of right-sided subdural hemorrhage, with no significant mass effect, and no areas of acute new hemorrhage. Redemonstration of encephalomalacia and laminar necrosis of high right frontal cortex, in a region of prior hemorrhage. Similar appearance of hematoma in the left periorbital region. Electronically Signed   By: Corrie Mckusick D.O.   On: 10/19/2017 15:50   Ct Head Wo Contrast  Addendum Date: 10/18/2017   ADDENDUM REPORT: 10/18/2017 15:50 ADDENDUM: Critical Value/emergent results were called by telephone at the time of interpretation on 10/18/2017 at 1539 hours to Dr. Addison Lank , who verbally acknowledged these results. Electronically Signed   By: Genevie Ann M.D.   On: 10/18/2017 15:50   Result Date: 10/18/2017 CLINICAL DATA:  52 year old female with acute onset lower extremity weakness causing a fall. Blunt trauma to the left eye with pain and swelling. History of PRES and intracranial hemorrhage in June 2018. EXAM: CT HEAD WITHOUT CONTRAST TECHNIQUE: Contiguous axial images were obtained from the base of the skull through the vertex without intravenous contrast. COMPARISON:  Head CT 12/24/2016  and earlier. FINDINGS: Brain: Mixed density but mostly hyperdense right side subdural hematoma measuring 3-4 millimeters in thickness (series 5, image 42) is new since the 2018 comparison. The hematoma is mildly lobulated over the right parietal convexity. No  midline shift. No hemorrhagic contusion or other acute intracranial hemorrhage identified. Encephalomalacia with dystrophic calcification at the right superior frontal gyrus corresponding to the area of intra-axial hemorrhage in 2018. The ventricle system is within normal limits. Basilar cisterns are patent. Gray-white matter differentiation outside of the area of right superior frontal lobe encephalomalacia appears normal. No cortically based acute infarct identified. Vascular: Calcified atherosclerosis at the skull base. No suspicious intracranial vascular hyperdensity. Skull: The visible left orbital walls appear stable and intact. No acute osseous abnormality identified. Sinuses/Orbits: Visualized paranasal sinuses and mastoids are stable and well pneumatized. Other: 16 MM thick broad-based hematoma along the left superolateral rim of the orbit and involving the preseptal space. The left globe appears to remain intact. The visible left postseptal and right orbit soft tissues appear normal. Hematoma tracks up the left lateral forehead scalp soft tissues. Other scalp soft tissues appear normal. IMPRESSION: 1. Small right lateral subdural hematoma measuring 3-4 mm in thickness. No midline shift or significant intracranial mass effect at this time. 2. No associated skull or visible right orbital wall fracture. Left periorbital and forehead superficial soft tissue hematoma. 3. No hemorrhagic contusion or other acute traumatic injury to the brain identified. 4. Right superior frontal lobe encephalomalacia related to the June 2018 intra-axial hemorrhage. Electronically Signed: By: Genevie Ann M.D. On: 10/18/2017 15:35       Subjective: Feels well.  No headache, no change in baseline left sided weakness.  No seizures.  No confusion, vomiting, vision changes.  Discharge Exam: Vitals:   10/19/17 1202 10/19/17 1250  BP: 120/73 (!) 100/58  Pulse: 76 75  Resp: 16 16  Temp: 98.4 F (36.9 C) 98.7 F (37.1 C)   SpO2: 97% 100%   Vitals:   10/19/17 0429 10/19/17 0815 10/19/17 1202 10/19/17 1250  BP: 124/82 123/85 120/73 (!) 100/58  Pulse: 75 76 76 75  Resp: 18 18 16 16   Temp:  98.1 F (36.7 C) 98.4 F (36.9 C) 98.7 F (37.1 C)  TempSrc:  Oral Oral Oral  SpO2: 98% 97% 97% 100%  Weight:    55.8 kg (123 lb 0.3 oz)  Height:        General: Pt is alert, awake, not in acute distress, left eye very swollen Cardiovascular: RRR, S1/S2 +, no rubs, no gallops Respiratory: CTA bilaterally, no wheezing, no rhonchi Abdominal: Soft, NT, ND, bowel sounds + Extremities: no edema, no cyanosis, left hemiparesis. Right arm fistula    The results of significant diagnostics from this hospitalization (including imaging, microbiology, ancillary and laboratory) are listed below for reference.     Microbiology: No results found for this or any previous visit (from the past 240 hour(s)).   Labs: BNP (last 3 results) No results for input(s): BNP in the last 8760 hours. Basic Metabolic Panel: Recent Labs  Lab 10/18/17 1612 10/19/17 0525  NA 144 143  K 3.6 3.2*  CL 99* 99*  CO2 24 23  GLUCOSE 88 105*  BUN 55* 51*  CREATININE 14.02* 14.04*  CALCIUM 8.8* 8.2*   Liver Function Tests: No results for input(s): AST, ALT, ALKPHOS, BILITOT, PROT, ALBUMIN in the last 168 hours. No results for input(s): LIPASE, AMYLASE in the last 168 hours. No results for input(s): AMMONIA in the last  168 hours. CBC: Recent Labs  Lab 10/18/17 1612 10/19/17 0525  WBC 12.6* 12.2*  NEUTROABS 8.9*  --   HGB 11.3* 10.2*  HCT 38.3 35.0*  MCV 109.4* 108.0*  PLT 397 369   Cardiac Enzymes: No results for input(s): CKTOTAL, CKMB, CKMBINDEX, TROPONINI in the last 168 hours. BNP: Invalid input(s): POCBNP CBG: Recent Labs  Lab 10/19/17 0427  GLUCAP 98   D-Dimer No results for input(s): DDIMER in the last 72 hours. Hgb A1c No results for input(s): HGBA1C in the last 72 hours. Lipid Profile No results for input(s):  CHOL, HDL, LDLCALC, TRIG, CHOLHDL, LDLDIRECT in the last 72 hours. Thyroid function studies No results for input(s): TSH, T4TOTAL, T3FREE, THYROIDAB in the last 72 hours.  Invalid input(s): FREET3 Anemia work up No results for input(s): VITAMINB12, FOLATE, FERRITIN, TIBC, IRON, RETICCTPCT in the last 72 hours. Urinalysis    Component Value Date/Time   COLORURINE YELLOW 01/09/2017 1736   APPEARANCEUR CLOUDY (A) 01/09/2017 1736   LABSPEC 1.016 01/09/2017 1736   PHURINE 5.0 01/09/2017 1736   GLUCOSEU NEGATIVE 01/09/2017 1736   HGBUR SMALL (A) 01/09/2017 1736   BILIRUBINUR NEGATIVE 01/09/2017 1736   KETONESUR NEGATIVE 01/09/2017 1736   PROTEINUR 100 (A) 01/09/2017 1736   UROBILINOGEN 0.2 05/15/2014 1717   NITRITE NEGATIVE 01/09/2017 1736   LEUKOCYTESUR MODERATE (A) 01/09/2017 1736   Sepsis Labs Invalid input(s): PROCALCITONIN,  WBC,  LACTICIDVEN Microbiology No results found for this or any previous visit (from the past 240 hour(s)).   Time coordinating discharge: 30 minutes  SIGNED:   Edwin Dada, MD  [facility] 10/19/2017, 4:29 PM

## 2017-10-19 NOTE — Care Management Note (Signed)
Case Management Note  Patient Details  Name: Terry Juarez MRN: [id number] Date of Birth: 31-May-1966  Subjective/Objective:   Pt admitted with subarachnoid hematoma after a fall with head trauma. She is from home with family and does peritoneal dialysis nightly.                Action/Plan: Currently the plan is to return home. MD please order PT/OT evals as needed. CM following for d/c needs, physician orders.   Expected Discharge Date:  (unknown)               Expected Discharge Plan:     In-House Referral:     Discharge planning Services     Post Acute Care Choice:    Choice offered to:     DME Arranged:    DME Agency:     HH Arranged:    HH Agency:     Status of Service:  In process, will continue to follow  If discussed at Long Length of Stay Meetings, dates discussed:    Additional Comments:  Pollie Friar, RN 10/19/2017, 12:36 PM

## 2017-10-20 DIAGNOSIS — N2589 Other disorders resulting from impaired renal tubular function: Secondary | ICD-10-CM | POA: Diagnosis not present

## 2017-10-20 DIAGNOSIS — D509 Iron deficiency anemia, unspecified: Secondary | ICD-10-CM | POA: Diagnosis not present

## 2017-10-20 DIAGNOSIS — N186 End stage renal disease: Secondary | ICD-10-CM | POA: Diagnosis not present

## 2017-10-20 DIAGNOSIS — N2581 Secondary hyperparathyroidism of renal origin: Secondary | ICD-10-CM | POA: Diagnosis not present

## 2017-10-20 DIAGNOSIS — D631 Anemia in chronic kidney disease: Secondary | ICD-10-CM | POA: Diagnosis not present

## 2017-10-20 DIAGNOSIS — K659 Peritonitis, unspecified: Secondary | ICD-10-CM | POA: Diagnosis not present

## 2017-10-20 NOTE — Care Management Note (Signed)
Case Management Note  Patient Details  Name: Terry Juarez MRN: [id number] Date of Birth: 07-Apr-1966  Subjective/Objective:                    Action/Plan: 10/20/2017 0908: pt discharged late yesterday. Pt has PCP, insurance and transportation home. No further needs per CM.   Expected Discharge Date:  10/19/17               Expected Discharge Plan:  Home/Self Care  In-House Referral:     Discharge planning Services     Post Acute Care Choice:    Choice offered to:     DME Arranged:    DME Agency:     HH Arranged:    HH Agency:     Status of Service:  Completed, signed off  If discussed at [employer] of Stay Meetings, dates discussed:    Additional Comments:  Pollie Friar, RN 10/20/2017, 9:08 AM

## 2017-10-21 DIAGNOSIS — D509 Iron deficiency anemia, unspecified: Secondary | ICD-10-CM | POA: Diagnosis not present

## 2017-10-21 DIAGNOSIS — N2589 Other disorders resulting from impaired renal tubular function: Secondary | ICD-10-CM | POA: Diagnosis not present

## 2017-10-21 DIAGNOSIS — N186 End stage renal disease: Secondary | ICD-10-CM | POA: Diagnosis not present

## 2017-10-21 DIAGNOSIS — K659 Peritonitis, unspecified: Secondary | ICD-10-CM | POA: Diagnosis not present

## 2017-10-21 DIAGNOSIS — D631 Anemia in chronic kidney disease: Secondary | ICD-10-CM | POA: Diagnosis not present

## 2017-10-21 DIAGNOSIS — N2581 Secondary hyperparathyroidism of renal origin: Secondary | ICD-10-CM | POA: Diagnosis not present

## 2017-10-22 DIAGNOSIS — K659 Peritonitis, unspecified: Secondary | ICD-10-CM | POA: Diagnosis not present

## 2017-10-22 DIAGNOSIS — D509 Iron deficiency anemia, unspecified: Secondary | ICD-10-CM | POA: Diagnosis not present

## 2017-10-22 DIAGNOSIS — N186 End stage renal disease: Secondary | ICD-10-CM | POA: Diagnosis not present

## 2017-10-22 DIAGNOSIS — N2589 Other disorders resulting from impaired renal tubular function: Secondary | ICD-10-CM | POA: Diagnosis not present

## 2017-10-22 DIAGNOSIS — D631 Anemia in chronic kidney disease: Secondary | ICD-10-CM | POA: Diagnosis not present

## 2017-10-22 DIAGNOSIS — N2581 Secondary hyperparathyroidism of renal origin: Secondary | ICD-10-CM | POA: Diagnosis not present

## 2017-10-23 DIAGNOSIS — N2581 Secondary hyperparathyroidism of renal origin: Secondary | ICD-10-CM | POA: Diagnosis not present

## 2017-10-23 DIAGNOSIS — K659 Peritonitis, unspecified: Secondary | ICD-10-CM | POA: Diagnosis not present

## 2017-10-23 DIAGNOSIS — N186 End stage renal disease: Secondary | ICD-10-CM | POA: Diagnosis not present

## 2017-10-23 DIAGNOSIS — D631 Anemia in chronic kidney disease: Secondary | ICD-10-CM | POA: Diagnosis not present

## 2017-10-23 DIAGNOSIS — D509 Iron deficiency anemia, unspecified: Secondary | ICD-10-CM | POA: Diagnosis not present

## 2017-10-23 DIAGNOSIS — N2589 Other disorders resulting from impaired renal tubular function: Secondary | ICD-10-CM | POA: Diagnosis not present

## 2017-10-24 DIAGNOSIS — K659 Peritonitis, unspecified: Secondary | ICD-10-CM | POA: Diagnosis not present

## 2017-10-24 DIAGNOSIS — N2581 Secondary hyperparathyroidism of renal origin: Secondary | ICD-10-CM | POA: Diagnosis not present

## 2017-10-24 DIAGNOSIS — D509 Iron deficiency anemia, unspecified: Secondary | ICD-10-CM | POA: Diagnosis not present

## 2017-10-24 DIAGNOSIS — D631 Anemia in chronic kidney disease: Secondary | ICD-10-CM | POA: Diagnosis not present

## 2017-10-24 DIAGNOSIS — N186 End stage renal disease: Secondary | ICD-10-CM | POA: Diagnosis not present

## 2017-10-24 DIAGNOSIS — N2589 Other disorders resulting from impaired renal tubular function: Secondary | ICD-10-CM | POA: Diagnosis not present

## 2017-10-25 DIAGNOSIS — N186 End stage renal disease: Secondary | ICD-10-CM | POA: Diagnosis not present

## 2017-10-25 DIAGNOSIS — D509 Iron deficiency anemia, unspecified: Secondary | ICD-10-CM | POA: Diagnosis not present

## 2017-10-25 DIAGNOSIS — N2581 Secondary hyperparathyroidism of renal origin: Secondary | ICD-10-CM | POA: Diagnosis not present

## 2017-10-25 DIAGNOSIS — K659 Peritonitis, unspecified: Secondary | ICD-10-CM | POA: Diagnosis not present

## 2017-10-25 DIAGNOSIS — D631 Anemia in chronic kidney disease: Secondary | ICD-10-CM | POA: Diagnosis not present

## 2017-10-25 DIAGNOSIS — N2589 Other disorders resulting from impaired renal tubular function: Secondary | ICD-10-CM | POA: Diagnosis not present

## 2017-10-26 ENCOUNTER — Other Ambulatory Visit: Payer: Self-pay | Admitting: Physical Medicine and Rehabilitation

## 2017-10-26 DIAGNOSIS — D631 Anemia in chronic kidney disease: Secondary | ICD-10-CM | POA: Diagnosis not present

## 2017-10-26 DIAGNOSIS — N186 End stage renal disease: Secondary | ICD-10-CM | POA: Diagnosis not present

## 2017-10-26 DIAGNOSIS — K659 Peritonitis, unspecified: Secondary | ICD-10-CM | POA: Diagnosis not present

## 2017-10-26 DIAGNOSIS — N2589 Other disorders resulting from impaired renal tubular function: Secondary | ICD-10-CM | POA: Diagnosis not present

## 2017-10-26 DIAGNOSIS — D509 Iron deficiency anemia, unspecified: Secondary | ICD-10-CM | POA: Diagnosis not present

## 2017-10-26 DIAGNOSIS — N2581 Secondary hyperparathyroidism of renal origin: Secondary | ICD-10-CM | POA: Diagnosis not present

## 2017-10-27 DIAGNOSIS — N2589 Other disorders resulting from impaired renal tubular function: Secondary | ICD-10-CM | POA: Diagnosis not present

## 2017-10-27 DIAGNOSIS — K659 Peritonitis, unspecified: Secondary | ICD-10-CM | POA: Diagnosis not present

## 2017-10-27 DIAGNOSIS — N186 End stage renal disease: Secondary | ICD-10-CM | POA: Diagnosis not present

## 2017-10-27 DIAGNOSIS — D509 Iron deficiency anemia, unspecified: Secondary | ICD-10-CM | POA: Diagnosis not present

## 2017-10-27 DIAGNOSIS — N2581 Secondary hyperparathyroidism of renal origin: Secondary | ICD-10-CM | POA: Diagnosis not present

## 2017-10-27 DIAGNOSIS — D631 Anemia in chronic kidney disease: Secondary | ICD-10-CM | POA: Diagnosis not present

## 2017-10-28 DIAGNOSIS — K659 Peritonitis, unspecified: Secondary | ICD-10-CM | POA: Diagnosis not present

## 2017-10-28 DIAGNOSIS — N2589 Other disorders resulting from impaired renal tubular function: Secondary | ICD-10-CM | POA: Diagnosis not present

## 2017-10-28 DIAGNOSIS — N2581 Secondary hyperparathyroidism of renal origin: Secondary | ICD-10-CM | POA: Diagnosis not present

## 2017-10-28 DIAGNOSIS — D631 Anemia in chronic kidney disease: Secondary | ICD-10-CM | POA: Diagnosis not present

## 2017-10-28 DIAGNOSIS — N186 End stage renal disease: Secondary | ICD-10-CM | POA: Diagnosis not present

## 2017-10-28 DIAGNOSIS — D509 Iron deficiency anemia, unspecified: Secondary | ICD-10-CM | POA: Diagnosis not present

## 2017-10-29 DIAGNOSIS — D631 Anemia in chronic kidney disease: Secondary | ICD-10-CM | POA: Diagnosis not present

## 2017-10-29 DIAGNOSIS — K659 Peritonitis, unspecified: Secondary | ICD-10-CM | POA: Diagnosis not present

## 2017-10-29 DIAGNOSIS — D509 Iron deficiency anemia, unspecified: Secondary | ICD-10-CM | POA: Diagnosis not present

## 2017-10-29 DIAGNOSIS — N186 End stage renal disease: Secondary | ICD-10-CM | POA: Diagnosis not present

## 2017-10-29 DIAGNOSIS — N2581 Secondary hyperparathyroidism of renal origin: Secondary | ICD-10-CM | POA: Diagnosis not present

## 2017-10-29 DIAGNOSIS — N2589 Other disorders resulting from impaired renal tubular function: Secondary | ICD-10-CM | POA: Diagnosis not present

## 2017-10-30 DIAGNOSIS — K659 Peritonitis, unspecified: Secondary | ICD-10-CM | POA: Diagnosis not present

## 2017-10-30 DIAGNOSIS — N186 End stage renal disease: Secondary | ICD-10-CM | POA: Diagnosis not present

## 2017-10-30 DIAGNOSIS — D509 Iron deficiency anemia, unspecified: Secondary | ICD-10-CM | POA: Diagnosis not present

## 2017-10-30 DIAGNOSIS — N2589 Other disorders resulting from impaired renal tubular function: Secondary | ICD-10-CM | POA: Diagnosis not present

## 2017-10-30 DIAGNOSIS — D631 Anemia in chronic kidney disease: Secondary | ICD-10-CM | POA: Diagnosis not present

## 2017-10-30 DIAGNOSIS — N2581 Secondary hyperparathyroidism of renal origin: Secondary | ICD-10-CM | POA: Diagnosis not present

## 2017-10-31 DIAGNOSIS — D631 Anemia in chronic kidney disease: Secondary | ICD-10-CM | POA: Diagnosis not present

## 2017-10-31 DIAGNOSIS — N2589 Other disorders resulting from impaired renal tubular function: Secondary | ICD-10-CM | POA: Diagnosis not present

## 2017-10-31 DIAGNOSIS — D509 Iron deficiency anemia, unspecified: Secondary | ICD-10-CM | POA: Diagnosis not present

## 2017-10-31 DIAGNOSIS — K659 Peritonitis, unspecified: Secondary | ICD-10-CM | POA: Diagnosis not present

## 2017-10-31 DIAGNOSIS — N2581 Secondary hyperparathyroidism of renal origin: Secondary | ICD-10-CM | POA: Diagnosis not present

## 2017-10-31 DIAGNOSIS — N186 End stage renal disease: Secondary | ICD-10-CM | POA: Diagnosis not present

## 2017-11-01 DIAGNOSIS — N2581 Secondary hyperparathyroidism of renal origin: Secondary | ICD-10-CM | POA: Diagnosis not present

## 2017-11-01 DIAGNOSIS — D509 Iron deficiency anemia, unspecified: Secondary | ICD-10-CM | POA: Diagnosis not present

## 2017-11-01 DIAGNOSIS — N186 End stage renal disease: Secondary | ICD-10-CM | POA: Diagnosis not present

## 2017-11-01 DIAGNOSIS — N2589 Other disorders resulting from impaired renal tubular function: Secondary | ICD-10-CM | POA: Diagnosis not present

## 2017-11-01 DIAGNOSIS — D631 Anemia in chronic kidney disease: Secondary | ICD-10-CM | POA: Diagnosis not present

## 2017-11-01 DIAGNOSIS — K659 Peritonitis, unspecified: Secondary | ICD-10-CM | POA: Diagnosis not present

## 2017-11-02 DIAGNOSIS — D509 Iron deficiency anemia, unspecified: Secondary | ICD-10-CM | POA: Diagnosis not present

## 2017-11-02 DIAGNOSIS — N2589 Other disorders resulting from impaired renal tubular function: Secondary | ICD-10-CM | POA: Diagnosis not present

## 2017-11-02 DIAGNOSIS — K659 Peritonitis, unspecified: Secondary | ICD-10-CM | POA: Diagnosis not present

## 2017-11-02 DIAGNOSIS — D631 Anemia in chronic kidney disease: Secondary | ICD-10-CM | POA: Diagnosis not present

## 2017-11-02 DIAGNOSIS — N186 End stage renal disease: Secondary | ICD-10-CM | POA: Diagnosis not present

## 2017-11-02 DIAGNOSIS — N2581 Secondary hyperparathyroidism of renal origin: Secondary | ICD-10-CM | POA: Diagnosis not present

## 2017-11-03 DIAGNOSIS — N186 End stage renal disease: Secondary | ICD-10-CM | POA: Diagnosis not present

## 2017-11-03 DIAGNOSIS — N2581 Secondary hyperparathyroidism of renal origin: Secondary | ICD-10-CM | POA: Diagnosis not present

## 2017-11-04 DIAGNOSIS — N186 End stage renal disease: Secondary | ICD-10-CM | POA: Diagnosis not present

## 2017-11-04 DIAGNOSIS — N2581 Secondary hyperparathyroidism of renal origin: Secondary | ICD-10-CM | POA: Diagnosis not present

## 2017-11-05 DIAGNOSIS — N2581 Secondary hyperparathyroidism of renal origin: Secondary | ICD-10-CM | POA: Diagnosis not present

## 2017-11-05 DIAGNOSIS — N186 End stage renal disease: Secondary | ICD-10-CM | POA: Diagnosis not present

## 2017-11-06 DIAGNOSIS — N186 End stage renal disease: Secondary | ICD-10-CM | POA: Diagnosis not present

## 2017-11-06 DIAGNOSIS — N2581 Secondary hyperparathyroidism of renal origin: Secondary | ICD-10-CM | POA: Diagnosis not present

## 2017-11-07 DIAGNOSIS — N186 End stage renal disease: Secondary | ICD-10-CM | POA: Diagnosis not present

## 2017-11-07 DIAGNOSIS — N2581 Secondary hyperparathyroidism of renal origin: Secondary | ICD-10-CM | POA: Diagnosis not present

## 2017-11-08 DIAGNOSIS — N186 End stage renal disease: Secondary | ICD-10-CM | POA: Diagnosis not present

## 2017-11-08 DIAGNOSIS — N2581 Secondary hyperparathyroidism of renal origin: Secondary | ICD-10-CM | POA: Diagnosis not present

## 2017-11-09 DIAGNOSIS — N2581 Secondary hyperparathyroidism of renal origin: Secondary | ICD-10-CM | POA: Diagnosis not present

## 2017-11-09 DIAGNOSIS — N186 End stage renal disease: Secondary | ICD-10-CM | POA: Diagnosis not present

## 2017-11-10 DIAGNOSIS — N186 End stage renal disease: Secondary | ICD-10-CM | POA: Diagnosis not present

## 2017-11-10 DIAGNOSIS — N2581 Secondary hyperparathyroidism of renal origin: Secondary | ICD-10-CM | POA: Diagnosis not present

## 2017-11-11 DIAGNOSIS — N2581 Secondary hyperparathyroidism of renal origin: Secondary | ICD-10-CM | POA: Diagnosis not present

## 2017-11-11 DIAGNOSIS — N186 End stage renal disease: Secondary | ICD-10-CM | POA: Diagnosis not present

## 2017-11-12 DIAGNOSIS — N2581 Secondary hyperparathyroidism of renal origin: Secondary | ICD-10-CM | POA: Diagnosis not present

## 2017-11-12 DIAGNOSIS — N186 End stage renal disease: Secondary | ICD-10-CM | POA: Diagnosis not present

## 2017-11-13 DIAGNOSIS — N2581 Secondary hyperparathyroidism of renal origin: Secondary | ICD-10-CM | POA: Diagnosis not present

## 2017-11-13 DIAGNOSIS — N186 End stage renal disease: Secondary | ICD-10-CM | POA: Diagnosis not present

## 2017-11-14 DIAGNOSIS — N2581 Secondary hyperparathyroidism of renal origin: Secondary | ICD-10-CM | POA: Diagnosis not present

## 2017-11-14 DIAGNOSIS — N186 End stage renal disease: Secondary | ICD-10-CM | POA: Diagnosis not present

## 2017-11-15 DIAGNOSIS — N2581 Secondary hyperparathyroidism of renal origin: Secondary | ICD-10-CM | POA: Diagnosis not present

## 2017-11-15 DIAGNOSIS — N186 End stage renal disease: Secondary | ICD-10-CM | POA: Diagnosis not present

## 2017-11-16 DIAGNOSIS — N2581 Secondary hyperparathyroidism of renal origin: Secondary | ICD-10-CM | POA: Diagnosis not present

## 2017-11-16 DIAGNOSIS — N186 End stage renal disease: Secondary | ICD-10-CM | POA: Diagnosis not present

## 2017-11-17 DIAGNOSIS — N2581 Secondary hyperparathyroidism of renal origin: Secondary | ICD-10-CM | POA: Diagnosis not present

## 2017-11-17 DIAGNOSIS — N186 End stage renal disease: Secondary | ICD-10-CM | POA: Diagnosis not present

## 2017-11-18 DIAGNOSIS — N186 End stage renal disease: Secondary | ICD-10-CM | POA: Diagnosis not present

## 2017-11-18 DIAGNOSIS — N2581 Secondary hyperparathyroidism of renal origin: Secondary | ICD-10-CM | POA: Diagnosis not present

## 2017-11-19 DIAGNOSIS — N186 End stage renal disease: Secondary | ICD-10-CM | POA: Diagnosis not present

## 2017-11-19 DIAGNOSIS — N2581 Secondary hyperparathyroidism of renal origin: Secondary | ICD-10-CM | POA: Diagnosis not present

## 2017-11-20 DIAGNOSIS — N186 End stage renal disease: Secondary | ICD-10-CM | POA: Diagnosis not present

## 2017-11-20 DIAGNOSIS — N2581 Secondary hyperparathyroidism of renal origin: Secondary | ICD-10-CM | POA: Diagnosis not present

## 2017-11-21 DIAGNOSIS — N186 End stage renal disease: Secondary | ICD-10-CM | POA: Diagnosis not present

## 2017-11-21 DIAGNOSIS — N2581 Secondary hyperparathyroidism of renal origin: Secondary | ICD-10-CM | POA: Diagnosis not present

## 2017-11-22 DIAGNOSIS — N2581 Secondary hyperparathyroidism of renal origin: Secondary | ICD-10-CM | POA: Diagnosis not present

## 2017-11-22 DIAGNOSIS — N186 End stage renal disease: Secondary | ICD-10-CM | POA: Diagnosis not present

## 2017-11-23 DIAGNOSIS — N186 End stage renal disease: Secondary | ICD-10-CM | POA: Diagnosis not present

## 2017-11-23 DIAGNOSIS — N2581 Secondary hyperparathyroidism of renal origin: Secondary | ICD-10-CM | POA: Diagnosis not present

## 2017-11-24 DIAGNOSIS — N186 End stage renal disease: Secondary | ICD-10-CM | POA: Diagnosis not present

## 2017-11-24 DIAGNOSIS — N2581 Secondary hyperparathyroidism of renal origin: Secondary | ICD-10-CM | POA: Diagnosis not present

## 2017-11-25 DIAGNOSIS — N2581 Secondary hyperparathyroidism of renal origin: Secondary | ICD-10-CM | POA: Diagnosis not present

## 2017-11-25 DIAGNOSIS — N186 End stage renal disease: Secondary | ICD-10-CM | POA: Diagnosis not present

## 2017-11-26 DIAGNOSIS — N186 End stage renal disease: Secondary | ICD-10-CM | POA: Diagnosis not present

## 2017-11-26 DIAGNOSIS — N2581 Secondary hyperparathyroidism of renal origin: Secondary | ICD-10-CM | POA: Diagnosis not present

## 2017-11-27 DIAGNOSIS — N186 End stage renal disease: Secondary | ICD-10-CM | POA: Diagnosis not present

## 2017-11-27 DIAGNOSIS — N2581 Secondary hyperparathyroidism of renal origin: Secondary | ICD-10-CM | POA: Diagnosis not present

## 2017-11-28 DIAGNOSIS — N186 End stage renal disease: Secondary | ICD-10-CM | POA: Diagnosis not present

## 2017-11-28 DIAGNOSIS — N2581 Secondary hyperparathyroidism of renal origin: Secondary | ICD-10-CM | POA: Diagnosis not present

## 2017-11-29 DIAGNOSIS — N186 End stage renal disease: Secondary | ICD-10-CM | POA: Diagnosis not present

## 2017-11-29 DIAGNOSIS — N2581 Secondary hyperparathyroidism of renal origin: Secondary | ICD-10-CM | POA: Diagnosis not present

## 2017-11-30 DIAGNOSIS — N2581 Secondary hyperparathyroidism of renal origin: Secondary | ICD-10-CM | POA: Diagnosis not present

## 2017-11-30 DIAGNOSIS — N186 End stage renal disease: Secondary | ICD-10-CM | POA: Diagnosis not present

## 2017-12-01 DIAGNOSIS — N2581 Secondary hyperparathyroidism of renal origin: Secondary | ICD-10-CM | POA: Diagnosis not present

## 2017-12-01 DIAGNOSIS — Z992 Dependence on renal dialysis: Secondary | ICD-10-CM | POA: Diagnosis not present

## 2017-12-01 DIAGNOSIS — M79604 Pain in right leg: Secondary | ICD-10-CM | POA: Diagnosis not present

## 2017-12-01 DIAGNOSIS — N186 End stage renal disease: Secondary | ICD-10-CM | POA: Diagnosis not present

## 2017-12-01 DIAGNOSIS — M79605 Pain in left leg: Secondary | ICD-10-CM | POA: Diagnosis not present

## 2017-12-02 DIAGNOSIS — N186 End stage renal disease: Secondary | ICD-10-CM | POA: Diagnosis not present

## 2017-12-02 DIAGNOSIS — N2581 Secondary hyperparathyroidism of renal origin: Secondary | ICD-10-CM | POA: Diagnosis not present

## 2017-12-03 DIAGNOSIS — R509 Fever, unspecified: Secondary | ICD-10-CM | POA: Insufficient documentation

## 2017-12-03 DIAGNOSIS — K659 Peritonitis, unspecified: Secondary | ICD-10-CM | POA: Diagnosis not present

## 2017-12-03 DIAGNOSIS — E890 Postprocedural hypothyroidism: Secondary | ICD-10-CM | POA: Diagnosis not present

## 2017-12-03 DIAGNOSIS — R82998 Other abnormal findings in urine: Secondary | ICD-10-CM | POA: Diagnosis not present

## 2017-12-03 DIAGNOSIS — N2581 Secondary hyperparathyroidism of renal origin: Secondary | ICD-10-CM | POA: Diagnosis not present

## 2017-12-03 DIAGNOSIS — N186 End stage renal disease: Secondary | ICD-10-CM | POA: Diagnosis not present

## 2017-12-04 ENCOUNTER — Ambulatory Visit (HOSPITAL_COMMUNITY): Payer: Medicare Other

## 2017-12-04 ENCOUNTER — Ambulatory Visit (HOSPITAL_COMMUNITY): Admission: RE | Admit: 2017-12-04 | Payer: Medicare Other | Admitting: Nephrology

## 2017-12-04 ENCOUNTER — Ambulatory Visit (HOSPITAL_COMMUNITY)
Admission: RE | Admit: 2017-12-04 | Discharge: 2017-12-04 | Disposition: A | Discharge status: Home / Self Care | Payer: Medicare Other | Attending: Nephrology | Admitting: Nephrology

## 2017-12-04 DIAGNOSIS — K659 Peritonitis, unspecified: Secondary | ICD-10-CM | POA: Diagnosis not present

## 2017-12-04 DIAGNOSIS — N186 End stage renal disease: Secondary | ICD-10-CM | POA: Diagnosis not present

## 2017-12-04 DIAGNOSIS — R880 Cloudy (hemodialysis) (peritoneal) dialysis effluent: Secondary | ICD-10-CM | POA: Diagnosis not present

## 2017-12-04 DIAGNOSIS — E872 Acidosis: Secondary | ICD-10-CM | POA: Diagnosis not present

## 2017-12-04 DIAGNOSIS — Z992 Dependence on renal dialysis: Secondary | ICD-10-CM | POA: Diagnosis not present

## 2017-12-04 DIAGNOSIS — N2581 Secondary hyperparathyroidism of renal origin: Secondary | ICD-10-CM | POA: Diagnosis not present

## 2017-12-04 DIAGNOSIS — R17 Unspecified jaundice: Secondary | ICD-10-CM | POA: Diagnosis not present

## 2017-12-04 DIAGNOSIS — E875 Hyperkalemia: Secondary | ICD-10-CM | POA: Diagnosis not present

## 2017-12-04 DIAGNOSIS — J449 Chronic obstructive pulmonary disease, unspecified: Secondary | ICD-10-CM | POA: Insufficient documentation

## 2017-12-04 DIAGNOSIS — Z4932 Encounter for adequacy testing for peritoneal dialysis: Secondary | ICD-10-CM | POA: Diagnosis not present

## 2017-12-04 DIAGNOSIS — E878 Other disorders of electrolyte and fluid balance, not elsewhere classified: Secondary | ICD-10-CM | POA: Diagnosis not present

## 2017-12-04 DIAGNOSIS — R509 Fever, unspecified: Secondary | ICD-10-CM | POA: Diagnosis not present

## 2017-12-04 DIAGNOSIS — N041 Nephrotic syndrome with focal and segmental glomerular lesions: Secondary | ICD-10-CM | POA: Diagnosis not present

## 2017-12-04 DIAGNOSIS — E440 Moderate protein-calorie malnutrition: Secondary | ICD-10-CM | POA: Diagnosis not present

## 2017-12-04 DIAGNOSIS — Z5189 Encounter for other specified aftercare: Secondary | ICD-10-CM | POA: Diagnosis not present

## 2017-12-04 DIAGNOSIS — N2589 Other disorders resulting from impaired renal tubular function: Secondary | ICD-10-CM | POA: Diagnosis not present

## 2017-12-05 DIAGNOSIS — E440 Moderate protein-calorie malnutrition: Secondary | ICD-10-CM | POA: Diagnosis not present

## 2017-12-05 DIAGNOSIS — Z4932 Encounter for adequacy testing for peritoneal dialysis: Secondary | ICD-10-CM | POA: Diagnosis not present

## 2017-12-05 DIAGNOSIS — R509 Fever, unspecified: Secondary | ICD-10-CM | POA: Diagnosis not present

## 2017-12-05 DIAGNOSIS — N186 End stage renal disease: Secondary | ICD-10-CM | POA: Diagnosis not present

## 2017-12-05 DIAGNOSIS — R880 Cloudy (hemodialysis) (peritoneal) dialysis effluent: Secondary | ICD-10-CM | POA: Diagnosis not present

## 2017-12-05 DIAGNOSIS — K659 Peritonitis, unspecified: Secondary | ICD-10-CM | POA: Diagnosis not present

## 2017-12-06 DIAGNOSIS — R880 Cloudy (hemodialysis) (peritoneal) dialysis effluent: Secondary | ICD-10-CM | POA: Diagnosis not present

## 2017-12-06 DIAGNOSIS — K659 Peritonitis, unspecified: Secondary | ICD-10-CM | POA: Diagnosis not present

## 2017-12-06 DIAGNOSIS — E440 Moderate protein-calorie malnutrition: Secondary | ICD-10-CM | POA: Diagnosis not present

## 2017-12-06 DIAGNOSIS — N186 End stage renal disease: Secondary | ICD-10-CM | POA: Diagnosis not present

## 2017-12-06 DIAGNOSIS — Z4932 Encounter for adequacy testing for peritoneal dialysis: Secondary | ICD-10-CM | POA: Diagnosis not present

## 2017-12-06 DIAGNOSIS — R509 Fever, unspecified: Secondary | ICD-10-CM | POA: Diagnosis not present

## 2017-12-07 DIAGNOSIS — R82998 Other abnormal findings in urine: Secondary | ICD-10-CM | POA: Diagnosis not present

## 2017-12-07 DIAGNOSIS — K659 Peritonitis, unspecified: Secondary | ICD-10-CM | POA: Diagnosis not present

## 2017-12-07 DIAGNOSIS — Z4932 Encounter for adequacy testing for peritoneal dialysis: Secondary | ICD-10-CM | POA: Diagnosis not present

## 2017-12-07 DIAGNOSIS — R509 Fever, unspecified: Secondary | ICD-10-CM | POA: Diagnosis not present

## 2017-12-07 DIAGNOSIS — E440 Moderate protein-calorie malnutrition: Secondary | ICD-10-CM | POA: Diagnosis not present

## 2017-12-07 DIAGNOSIS — R880 Cloudy (hemodialysis) (peritoneal) dialysis effluent: Secondary | ICD-10-CM | POA: Diagnosis not present

## 2017-12-07 DIAGNOSIS — N186 End stage renal disease: Secondary | ICD-10-CM | POA: Diagnosis not present

## 2017-12-08 DIAGNOSIS — R880 Cloudy (hemodialysis) (peritoneal) dialysis effluent: Secondary | ICD-10-CM | POA: Diagnosis not present

## 2017-12-08 DIAGNOSIS — N186 End stage renal disease: Secondary | ICD-10-CM | POA: Diagnosis not present

## 2017-12-08 DIAGNOSIS — K659 Peritonitis, unspecified: Secondary | ICD-10-CM | POA: Diagnosis not present

## 2017-12-08 DIAGNOSIS — Z4932 Encounter for adequacy testing for peritoneal dialysis: Secondary | ICD-10-CM | POA: Diagnosis not present

## 2017-12-08 DIAGNOSIS — E440 Moderate protein-calorie malnutrition: Secondary | ICD-10-CM | POA: Diagnosis not present

## 2017-12-08 DIAGNOSIS — R509 Fever, unspecified: Secondary | ICD-10-CM | POA: Diagnosis not present

## 2017-12-08 DIAGNOSIS — Z79899 Other long term (current) drug therapy: Secondary | ICD-10-CM | POA: Diagnosis not present

## 2017-12-09 DIAGNOSIS — R880 Cloudy (hemodialysis) (peritoneal) dialysis effluent: Secondary | ICD-10-CM | POA: Diagnosis not present

## 2017-12-09 DIAGNOSIS — K659 Peritonitis, unspecified: Secondary | ICD-10-CM | POA: Diagnosis not present

## 2017-12-09 DIAGNOSIS — E440 Moderate protein-calorie malnutrition: Secondary | ICD-10-CM | POA: Diagnosis not present

## 2017-12-09 DIAGNOSIS — R509 Fever, unspecified: Secondary | ICD-10-CM | POA: Diagnosis not present

## 2017-12-09 DIAGNOSIS — N186 End stage renal disease: Secondary | ICD-10-CM | POA: Diagnosis not present

## 2017-12-09 DIAGNOSIS — Z4932 Encounter for adequacy testing for peritoneal dialysis: Secondary | ICD-10-CM | POA: Diagnosis not present

## 2017-12-10 DIAGNOSIS — R880 Cloudy (hemodialysis) (peritoneal) dialysis effluent: Secondary | ICD-10-CM | POA: Diagnosis not present

## 2017-12-10 DIAGNOSIS — N186 End stage renal disease: Secondary | ICD-10-CM | POA: Diagnosis not present

## 2017-12-10 DIAGNOSIS — Z4932 Encounter for adequacy testing for peritoneal dialysis: Secondary | ICD-10-CM | POA: Diagnosis not present

## 2017-12-10 DIAGNOSIS — R509 Fever, unspecified: Secondary | ICD-10-CM | POA: Diagnosis not present

## 2017-12-10 DIAGNOSIS — E440 Moderate protein-calorie malnutrition: Secondary | ICD-10-CM | POA: Diagnosis not present

## 2017-12-10 DIAGNOSIS — K659 Peritonitis, unspecified: Secondary | ICD-10-CM | POA: Diagnosis not present

## 2017-12-11 DIAGNOSIS — R509 Fever, unspecified: Secondary | ICD-10-CM | POA: Diagnosis not present

## 2017-12-11 DIAGNOSIS — K659 Peritonitis, unspecified: Secondary | ICD-10-CM | POA: Diagnosis not present

## 2017-12-11 DIAGNOSIS — E440 Moderate protein-calorie malnutrition: Secondary | ICD-10-CM | POA: Diagnosis not present

## 2017-12-11 DIAGNOSIS — R880 Cloudy (hemodialysis) (peritoneal) dialysis effluent: Secondary | ICD-10-CM | POA: Diagnosis not present

## 2017-12-11 DIAGNOSIS — Z4932 Encounter for adequacy testing for peritoneal dialysis: Secondary | ICD-10-CM | POA: Diagnosis not present

## 2017-12-11 DIAGNOSIS — N186 End stage renal disease: Secondary | ICD-10-CM | POA: Diagnosis not present

## 2017-12-12 DIAGNOSIS — Z4932 Encounter for adequacy testing for peritoneal dialysis: Secondary | ICD-10-CM | POA: Diagnosis not present

## 2017-12-12 DIAGNOSIS — R509 Fever, unspecified: Secondary | ICD-10-CM | POA: Diagnosis not present

## 2017-12-12 DIAGNOSIS — R880 Cloudy (hemodialysis) (peritoneal) dialysis effluent: Secondary | ICD-10-CM | POA: Diagnosis not present

## 2017-12-12 DIAGNOSIS — N186 End stage renal disease: Secondary | ICD-10-CM | POA: Diagnosis not present

## 2017-12-12 DIAGNOSIS — E440 Moderate protein-calorie malnutrition: Secondary | ICD-10-CM | POA: Diagnosis not present

## 2017-12-12 DIAGNOSIS — K659 Peritonitis, unspecified: Secondary | ICD-10-CM | POA: Diagnosis not present

## 2017-12-13 DIAGNOSIS — Z4932 Encounter for adequacy testing for peritoneal dialysis: Secondary | ICD-10-CM | POA: Diagnosis not present

## 2017-12-13 DIAGNOSIS — R880 Cloudy (hemodialysis) (peritoneal) dialysis effluent: Secondary | ICD-10-CM | POA: Diagnosis not present

## 2017-12-13 DIAGNOSIS — N186 End stage renal disease: Secondary | ICD-10-CM | POA: Diagnosis not present

## 2017-12-13 DIAGNOSIS — R509 Fever, unspecified: Secondary | ICD-10-CM | POA: Diagnosis not present

## 2017-12-13 DIAGNOSIS — K659 Peritonitis, unspecified: Secondary | ICD-10-CM | POA: Diagnosis not present

## 2017-12-13 DIAGNOSIS — E440 Moderate protein-calorie malnutrition: Secondary | ICD-10-CM | POA: Diagnosis not present

## 2017-12-14 DIAGNOSIS — R880 Cloudy (hemodialysis) (peritoneal) dialysis effluent: Secondary | ICD-10-CM | POA: Diagnosis not present

## 2017-12-14 DIAGNOSIS — E440 Moderate protein-calorie malnutrition: Secondary | ICD-10-CM | POA: Diagnosis not present

## 2017-12-14 DIAGNOSIS — Z4932 Encounter for adequacy testing for peritoneal dialysis: Secondary | ICD-10-CM | POA: Diagnosis not present

## 2017-12-14 DIAGNOSIS — K659 Peritonitis, unspecified: Secondary | ICD-10-CM | POA: Diagnosis not present

## 2017-12-14 DIAGNOSIS — R509 Fever, unspecified: Secondary | ICD-10-CM | POA: Diagnosis not present

## 2017-12-14 DIAGNOSIS — N186 End stage renal disease: Secondary | ICD-10-CM | POA: Diagnosis not present

## 2017-12-15 DIAGNOSIS — Z4932 Encounter for adequacy testing for peritoneal dialysis: Secondary | ICD-10-CM | POA: Diagnosis not present

## 2017-12-15 DIAGNOSIS — E440 Moderate protein-calorie malnutrition: Secondary | ICD-10-CM | POA: Diagnosis not present

## 2017-12-15 DIAGNOSIS — N186 End stage renal disease: Secondary | ICD-10-CM | POA: Diagnosis not present

## 2017-12-15 DIAGNOSIS — R509 Fever, unspecified: Secondary | ICD-10-CM | POA: Diagnosis not present

## 2017-12-15 DIAGNOSIS — K659 Peritonitis, unspecified: Secondary | ICD-10-CM | POA: Diagnosis not present

## 2017-12-15 DIAGNOSIS — R880 Cloudy (hemodialysis) (peritoneal) dialysis effluent: Secondary | ICD-10-CM | POA: Diagnosis not present

## 2017-12-16 DIAGNOSIS — Z4932 Encounter for adequacy testing for peritoneal dialysis: Secondary | ICD-10-CM | POA: Diagnosis not present

## 2017-12-16 DIAGNOSIS — R509 Fever, unspecified: Secondary | ICD-10-CM | POA: Diagnosis not present

## 2017-12-16 DIAGNOSIS — E440 Moderate protein-calorie malnutrition: Secondary | ICD-10-CM | POA: Diagnosis not present

## 2017-12-16 DIAGNOSIS — N186 End stage renal disease: Secondary | ICD-10-CM | POA: Diagnosis not present

## 2017-12-16 DIAGNOSIS — K659 Peritonitis, unspecified: Secondary | ICD-10-CM | POA: Diagnosis not present

## 2017-12-16 DIAGNOSIS — R880 Cloudy (hemodialysis) (peritoneal) dialysis effluent: Secondary | ICD-10-CM | POA: Diagnosis not present

## 2017-12-17 DIAGNOSIS — R509 Fever, unspecified: Secondary | ICD-10-CM | POA: Diagnosis not present

## 2017-12-17 DIAGNOSIS — E440 Moderate protein-calorie malnutrition: Secondary | ICD-10-CM | POA: Diagnosis not present

## 2017-12-17 DIAGNOSIS — K659 Peritonitis, unspecified: Secondary | ICD-10-CM | POA: Diagnosis not present

## 2017-12-17 DIAGNOSIS — R880 Cloudy (hemodialysis) (peritoneal) dialysis effluent: Secondary | ICD-10-CM | POA: Diagnosis not present

## 2017-12-17 DIAGNOSIS — N186 End stage renal disease: Secondary | ICD-10-CM | POA: Diagnosis not present

## 2017-12-17 DIAGNOSIS — Z4932 Encounter for adequacy testing for peritoneal dialysis: Secondary | ICD-10-CM | POA: Diagnosis not present

## 2017-12-18 DIAGNOSIS — R880 Cloudy (hemodialysis) (peritoneal) dialysis effluent: Secondary | ICD-10-CM | POA: Diagnosis not present

## 2017-12-18 DIAGNOSIS — N186 End stage renal disease: Secondary | ICD-10-CM | POA: Diagnosis not present

## 2017-12-18 DIAGNOSIS — R509 Fever, unspecified: Secondary | ICD-10-CM | POA: Diagnosis not present

## 2017-12-18 DIAGNOSIS — Z4932 Encounter for adequacy testing for peritoneal dialysis: Secondary | ICD-10-CM | POA: Diagnosis not present

## 2017-12-18 DIAGNOSIS — E440 Moderate protein-calorie malnutrition: Secondary | ICD-10-CM | POA: Diagnosis not present

## 2017-12-18 DIAGNOSIS — K659 Peritonitis, unspecified: Secondary | ICD-10-CM | POA: Diagnosis not present

## 2017-12-19 DIAGNOSIS — E440 Moderate protein-calorie malnutrition: Secondary | ICD-10-CM | POA: Diagnosis not present

## 2017-12-19 DIAGNOSIS — K659 Peritonitis, unspecified: Secondary | ICD-10-CM | POA: Diagnosis not present

## 2017-12-19 DIAGNOSIS — R880 Cloudy (hemodialysis) (peritoneal) dialysis effluent: Secondary | ICD-10-CM | POA: Diagnosis not present

## 2017-12-19 DIAGNOSIS — N186 End stage renal disease: Secondary | ICD-10-CM | POA: Diagnosis not present

## 2017-12-19 DIAGNOSIS — Z4932 Encounter for adequacy testing for peritoneal dialysis: Secondary | ICD-10-CM | POA: Diagnosis not present

## 2017-12-19 DIAGNOSIS — R509 Fever, unspecified: Secondary | ICD-10-CM | POA: Diagnosis not present

## 2017-12-20 DIAGNOSIS — Z4932 Encounter for adequacy testing for peritoneal dialysis: Secondary | ICD-10-CM | POA: Diagnosis not present

## 2017-12-20 DIAGNOSIS — E440 Moderate protein-calorie malnutrition: Secondary | ICD-10-CM | POA: Diagnosis not present

## 2017-12-20 DIAGNOSIS — R880 Cloudy (hemodialysis) (peritoneal) dialysis effluent: Secondary | ICD-10-CM | POA: Diagnosis not present

## 2017-12-20 DIAGNOSIS — R509 Fever, unspecified: Secondary | ICD-10-CM | POA: Diagnosis not present

## 2017-12-20 DIAGNOSIS — K659 Peritonitis, unspecified: Secondary | ICD-10-CM | POA: Diagnosis not present

## 2017-12-20 DIAGNOSIS — N186 End stage renal disease: Secondary | ICD-10-CM | POA: Diagnosis not present

## 2017-12-21 DIAGNOSIS — N042 Nephrotic syndrome with diffuse membranous glomerulonephritis: Secondary | ICD-10-CM | POA: Diagnosis not present

## 2017-12-21 DIAGNOSIS — N186 End stage renal disease: Secondary | ICD-10-CM | POA: Diagnosis not present

## 2017-12-21 DIAGNOSIS — K659 Peritonitis, unspecified: Secondary | ICD-10-CM | POA: Diagnosis not present

## 2017-12-21 DIAGNOSIS — R880 Cloudy (hemodialysis) (peritoneal) dialysis effluent: Secondary | ICD-10-CM | POA: Diagnosis not present

## 2017-12-21 DIAGNOSIS — Z992 Dependence on renal dialysis: Secondary | ICD-10-CM | POA: Diagnosis not present

## 2017-12-21 DIAGNOSIS — D508 Other iron deficiency anemias: Secondary | ICD-10-CM | POA: Diagnosis not present

## 2017-12-21 DIAGNOSIS — G6289 Other specified polyneuropathies: Secondary | ICD-10-CM | POA: Diagnosis not present

## 2017-12-21 DIAGNOSIS — Z4932 Encounter for adequacy testing for peritoneal dialysis: Secondary | ICD-10-CM | POA: Diagnosis not present

## 2017-12-21 DIAGNOSIS — R509 Fever, unspecified: Secondary | ICD-10-CM | POA: Diagnosis not present

## 2017-12-21 DIAGNOSIS — Z6820 Body mass index (BMI) 20.0-20.9, adult: Secondary | ICD-10-CM | POA: Diagnosis not present

## 2017-12-21 DIAGNOSIS — G629 Polyneuropathy, unspecified: Secondary | ICD-10-CM | POA: Diagnosis not present

## 2017-12-21 DIAGNOSIS — I629 Nontraumatic intracranial hemorrhage, unspecified: Secondary | ICD-10-CM | POA: Diagnosis not present

## 2017-12-21 DIAGNOSIS — E440 Moderate protein-calorie malnutrition: Secondary | ICD-10-CM | POA: Diagnosis not present

## 2017-12-21 DIAGNOSIS — E7800 Pure hypercholesterolemia, unspecified: Secondary | ICD-10-CM | POA: Diagnosis not present

## 2017-12-21 DIAGNOSIS — I10 Essential (primary) hypertension: Secondary | ICD-10-CM | POA: Diagnosis not present

## 2017-12-22 DIAGNOSIS — Z4932 Encounter for adequacy testing for peritoneal dialysis: Secondary | ICD-10-CM | POA: Diagnosis not present

## 2017-12-22 DIAGNOSIS — N186 End stage renal disease: Secondary | ICD-10-CM | POA: Diagnosis not present

## 2017-12-22 DIAGNOSIS — R880 Cloudy (hemodialysis) (peritoneal) dialysis effluent: Secondary | ICD-10-CM | POA: Diagnosis not present

## 2017-12-22 DIAGNOSIS — R509 Fever, unspecified: Secondary | ICD-10-CM | POA: Diagnosis not present

## 2017-12-22 DIAGNOSIS — E440 Moderate protein-calorie malnutrition: Secondary | ICD-10-CM | POA: Diagnosis not present

## 2017-12-22 DIAGNOSIS — K659 Peritonitis, unspecified: Secondary | ICD-10-CM | POA: Diagnosis not present

## 2017-12-23 DIAGNOSIS — Z4932 Encounter for adequacy testing for peritoneal dialysis: Secondary | ICD-10-CM | POA: Diagnosis not present

## 2017-12-23 DIAGNOSIS — E440 Moderate protein-calorie malnutrition: Secondary | ICD-10-CM | POA: Diagnosis not present

## 2017-12-23 DIAGNOSIS — N186 End stage renal disease: Secondary | ICD-10-CM | POA: Diagnosis not present

## 2017-12-23 DIAGNOSIS — R880 Cloudy (hemodialysis) (peritoneal) dialysis effluent: Secondary | ICD-10-CM | POA: Diagnosis not present

## 2017-12-23 DIAGNOSIS — R509 Fever, unspecified: Secondary | ICD-10-CM | POA: Diagnosis not present

## 2017-12-23 DIAGNOSIS — K659 Peritonitis, unspecified: Secondary | ICD-10-CM | POA: Diagnosis not present

## 2017-12-24 DIAGNOSIS — E440 Moderate protein-calorie malnutrition: Secondary | ICD-10-CM | POA: Diagnosis not present

## 2017-12-24 DIAGNOSIS — N186 End stage renal disease: Secondary | ICD-10-CM | POA: Diagnosis not present

## 2017-12-24 DIAGNOSIS — R509 Fever, unspecified: Secondary | ICD-10-CM | POA: Diagnosis not present

## 2017-12-24 DIAGNOSIS — R880 Cloudy (hemodialysis) (peritoneal) dialysis effluent: Secondary | ICD-10-CM | POA: Diagnosis not present

## 2017-12-24 DIAGNOSIS — Z4932 Encounter for adequacy testing for peritoneal dialysis: Secondary | ICD-10-CM | POA: Diagnosis not present

## 2017-12-24 DIAGNOSIS — K659 Peritonitis, unspecified: Secondary | ICD-10-CM | POA: Diagnosis not present

## 2017-12-25 DIAGNOSIS — E440 Moderate protein-calorie malnutrition: Secondary | ICD-10-CM | POA: Diagnosis not present

## 2017-12-25 DIAGNOSIS — K659 Peritonitis, unspecified: Secondary | ICD-10-CM | POA: Diagnosis not present

## 2017-12-25 DIAGNOSIS — Z4932 Encounter for adequacy testing for peritoneal dialysis: Secondary | ICD-10-CM | POA: Diagnosis not present

## 2017-12-25 DIAGNOSIS — R509 Fever, unspecified: Secondary | ICD-10-CM | POA: Diagnosis not present

## 2017-12-25 DIAGNOSIS — R880 Cloudy (hemodialysis) (peritoneal) dialysis effluent: Secondary | ICD-10-CM | POA: Diagnosis not present

## 2017-12-25 DIAGNOSIS — N186 End stage renal disease: Secondary | ICD-10-CM | POA: Diagnosis not present

## 2017-12-26 DIAGNOSIS — R880 Cloudy (hemodialysis) (peritoneal) dialysis effluent: Secondary | ICD-10-CM | POA: Diagnosis not present

## 2017-12-26 DIAGNOSIS — N186 End stage renal disease: Secondary | ICD-10-CM | POA: Diagnosis not present

## 2017-12-26 DIAGNOSIS — E440 Moderate protein-calorie malnutrition: Secondary | ICD-10-CM | POA: Diagnosis not present

## 2017-12-26 DIAGNOSIS — R509 Fever, unspecified: Secondary | ICD-10-CM | POA: Diagnosis not present

## 2017-12-26 DIAGNOSIS — K659 Peritonitis, unspecified: Secondary | ICD-10-CM | POA: Diagnosis not present

## 2017-12-26 DIAGNOSIS — Z4932 Encounter for adequacy testing for peritoneal dialysis: Secondary | ICD-10-CM | POA: Diagnosis not present

## 2017-12-27 DIAGNOSIS — R880 Cloudy (hemodialysis) (peritoneal) dialysis effluent: Secondary | ICD-10-CM | POA: Diagnosis not present

## 2017-12-27 DIAGNOSIS — R509 Fever, unspecified: Secondary | ICD-10-CM | POA: Diagnosis not present

## 2017-12-27 DIAGNOSIS — E440 Moderate protein-calorie malnutrition: Secondary | ICD-10-CM | POA: Diagnosis not present

## 2017-12-27 DIAGNOSIS — K659 Peritonitis, unspecified: Secondary | ICD-10-CM | POA: Diagnosis not present

## 2017-12-27 DIAGNOSIS — Z4932 Encounter for adequacy testing for peritoneal dialysis: Secondary | ICD-10-CM | POA: Diagnosis not present

## 2017-12-27 DIAGNOSIS — N186 End stage renal disease: Secondary | ICD-10-CM | POA: Diagnosis not present

## 2017-12-28 DIAGNOSIS — R509 Fever, unspecified: Secondary | ICD-10-CM | POA: Diagnosis not present

## 2017-12-28 DIAGNOSIS — R880 Cloudy (hemodialysis) (peritoneal) dialysis effluent: Secondary | ICD-10-CM | POA: Diagnosis not present

## 2017-12-28 DIAGNOSIS — Z4932 Encounter for adequacy testing for peritoneal dialysis: Secondary | ICD-10-CM | POA: Diagnosis not present

## 2017-12-28 DIAGNOSIS — N186 End stage renal disease: Secondary | ICD-10-CM | POA: Diagnosis not present

## 2017-12-28 DIAGNOSIS — K659 Peritonitis, unspecified: Secondary | ICD-10-CM | POA: Diagnosis not present

## 2017-12-28 DIAGNOSIS — E440 Moderate protein-calorie malnutrition: Secondary | ICD-10-CM | POA: Diagnosis not present

## 2017-12-29 DIAGNOSIS — K659 Peritonitis, unspecified: Secondary | ICD-10-CM | POA: Diagnosis not present

## 2017-12-29 DIAGNOSIS — E440 Moderate protein-calorie malnutrition: Secondary | ICD-10-CM | POA: Diagnosis not present

## 2017-12-29 DIAGNOSIS — Z4932 Encounter for adequacy testing for peritoneal dialysis: Secondary | ICD-10-CM | POA: Diagnosis not present

## 2017-12-29 DIAGNOSIS — R880 Cloudy (hemodialysis) (peritoneal) dialysis effluent: Secondary | ICD-10-CM | POA: Diagnosis not present

## 2017-12-29 DIAGNOSIS — R509 Fever, unspecified: Secondary | ICD-10-CM | POA: Diagnosis not present

## 2017-12-29 DIAGNOSIS — N186 End stage renal disease: Secondary | ICD-10-CM | POA: Diagnosis not present

## 2017-12-30 DIAGNOSIS — K659 Peritonitis, unspecified: Secondary | ICD-10-CM | POA: Diagnosis not present

## 2017-12-30 DIAGNOSIS — Z4932 Encounter for adequacy testing for peritoneal dialysis: Secondary | ICD-10-CM | POA: Diagnosis not present

## 2017-12-30 DIAGNOSIS — N186 End stage renal disease: Secondary | ICD-10-CM | POA: Diagnosis not present

## 2017-12-30 DIAGNOSIS — R880 Cloudy (hemodialysis) (peritoneal) dialysis effluent: Secondary | ICD-10-CM | POA: Diagnosis not present

## 2017-12-30 DIAGNOSIS — E440 Moderate protein-calorie malnutrition: Secondary | ICD-10-CM | POA: Diagnosis not present

## 2017-12-30 DIAGNOSIS — R509 Fever, unspecified: Secondary | ICD-10-CM | POA: Diagnosis not present

## 2017-12-31 DIAGNOSIS — R880 Cloudy (hemodialysis) (peritoneal) dialysis effluent: Secondary | ICD-10-CM | POA: Diagnosis not present

## 2017-12-31 DIAGNOSIS — N186 End stage renal disease: Secondary | ICD-10-CM | POA: Diagnosis not present

## 2017-12-31 DIAGNOSIS — K659 Peritonitis, unspecified: Secondary | ICD-10-CM | POA: Diagnosis not present

## 2017-12-31 DIAGNOSIS — E440 Moderate protein-calorie malnutrition: Secondary | ICD-10-CM | POA: Diagnosis not present

## 2017-12-31 DIAGNOSIS — R509 Fever, unspecified: Secondary | ICD-10-CM | POA: Diagnosis not present

## 2017-12-31 DIAGNOSIS — Z4932 Encounter for adequacy testing for peritoneal dialysis: Secondary | ICD-10-CM | POA: Diagnosis not present

## 2018-01-01 DIAGNOSIS — N186 End stage renal disease: Secondary | ICD-10-CM | POA: Diagnosis not present

## 2018-01-01 DIAGNOSIS — R509 Fever, unspecified: Secondary | ICD-10-CM | POA: Diagnosis not present

## 2018-01-01 DIAGNOSIS — K659 Peritonitis, unspecified: Secondary | ICD-10-CM | POA: Diagnosis not present

## 2018-01-01 DIAGNOSIS — R880 Cloudy (hemodialysis) (peritoneal) dialysis effluent: Secondary | ICD-10-CM | POA: Diagnosis not present

## 2018-01-01 DIAGNOSIS — Z4932 Encounter for adequacy testing for peritoneal dialysis: Secondary | ICD-10-CM | POA: Diagnosis not present

## 2018-01-01 DIAGNOSIS — E440 Moderate protein-calorie malnutrition: Secondary | ICD-10-CM | POA: Diagnosis not present

## 2018-01-02 DIAGNOSIS — Z4932 Encounter for adequacy testing for peritoneal dialysis: Secondary | ICD-10-CM | POA: Diagnosis not present

## 2018-01-02 DIAGNOSIS — R509 Fever, unspecified: Secondary | ICD-10-CM | POA: Diagnosis not present

## 2018-01-02 DIAGNOSIS — K659 Peritonitis, unspecified: Secondary | ICD-10-CM | POA: Diagnosis not present

## 2018-01-02 DIAGNOSIS — N186 End stage renal disease: Secondary | ICD-10-CM | POA: Diagnosis not present

## 2018-01-02 DIAGNOSIS — E440 Moderate protein-calorie malnutrition: Secondary | ICD-10-CM | POA: Diagnosis not present

## 2018-01-02 DIAGNOSIS — R880 Cloudy (hemodialysis) (peritoneal) dialysis effluent: Secondary | ICD-10-CM | POA: Diagnosis not present

## 2018-01-03 DIAGNOSIS — K659 Peritonitis, unspecified: Secondary | ICD-10-CM | POA: Diagnosis not present

## 2018-01-03 DIAGNOSIS — N186 End stage renal disease: Secondary | ICD-10-CM | POA: Diagnosis not present

## 2018-01-03 DIAGNOSIS — Z992 Dependence on renal dialysis: Secondary | ICD-10-CM | POA: Diagnosis not present

## 2018-01-03 DIAGNOSIS — N041 Nephrotic syndrome with focal and segmental glomerular lesions: Secondary | ICD-10-CM | POA: Diagnosis not present

## 2018-01-03 DIAGNOSIS — R17 Unspecified jaundice: Secondary | ICD-10-CM | POA: Diagnosis not present

## 2018-01-03 DIAGNOSIS — Z5189 Encounter for other specified aftercare: Secondary | ICD-10-CM | POA: Diagnosis not present

## 2018-01-03 DIAGNOSIS — N2581 Secondary hyperparathyroidism of renal origin: Secondary | ICD-10-CM | POA: Diagnosis not present

## 2018-01-03 DIAGNOSIS — D509 Iron deficiency anemia, unspecified: Secondary | ICD-10-CM | POA: Diagnosis not present

## 2018-01-03 DIAGNOSIS — D631 Anemia in chronic kidney disease: Secondary | ICD-10-CM | POA: Diagnosis not present

## 2018-01-03 DIAGNOSIS — Z79899 Other long term (current) drug therapy: Secondary | ICD-10-CM | POA: Diagnosis not present

## 2018-01-03 DIAGNOSIS — N2589 Other disorders resulting from impaired renal tubular function: Secondary | ICD-10-CM | POA: Diagnosis not present

## 2018-01-03 DIAGNOSIS — E440 Moderate protein-calorie malnutrition: Secondary | ICD-10-CM | POA: Diagnosis not present

## 2018-01-03 DIAGNOSIS — Z4932 Encounter for adequacy testing for peritoneal dialysis: Secondary | ICD-10-CM | POA: Diagnosis not present

## 2018-01-04 DIAGNOSIS — D631 Anemia in chronic kidney disease: Secondary | ICD-10-CM | POA: Diagnosis not present

## 2018-01-04 DIAGNOSIS — E440 Moderate protein-calorie malnutrition: Secondary | ICD-10-CM | POA: Diagnosis not present

## 2018-01-04 DIAGNOSIS — N186 End stage renal disease: Secondary | ICD-10-CM | POA: Diagnosis not present

## 2018-01-04 DIAGNOSIS — D509 Iron deficiency anemia, unspecified: Secondary | ICD-10-CM | POA: Diagnosis not present

## 2018-01-04 DIAGNOSIS — N2581 Secondary hyperparathyroidism of renal origin: Secondary | ICD-10-CM | POA: Diagnosis not present

## 2018-01-04 DIAGNOSIS — K659 Peritonitis, unspecified: Secondary | ICD-10-CM | POA: Diagnosis not present

## 2018-01-05 DIAGNOSIS — N2581 Secondary hyperparathyroidism of renal origin: Secondary | ICD-10-CM | POA: Diagnosis not present

## 2018-01-05 DIAGNOSIS — D631 Anemia in chronic kidney disease: Secondary | ICD-10-CM | POA: Diagnosis not present

## 2018-01-05 DIAGNOSIS — K659 Peritonitis, unspecified: Secondary | ICD-10-CM | POA: Diagnosis not present

## 2018-01-05 DIAGNOSIS — E440 Moderate protein-calorie malnutrition: Secondary | ICD-10-CM | POA: Diagnosis not present

## 2018-01-05 DIAGNOSIS — N186 End stage renal disease: Secondary | ICD-10-CM | POA: Diagnosis not present

## 2018-01-05 DIAGNOSIS — D509 Iron deficiency anemia, unspecified: Secondary | ICD-10-CM | POA: Diagnosis not present

## 2018-01-06 DIAGNOSIS — N2581 Secondary hyperparathyroidism of renal origin: Secondary | ICD-10-CM | POA: Diagnosis not present

## 2018-01-06 DIAGNOSIS — E440 Moderate protein-calorie malnutrition: Secondary | ICD-10-CM | POA: Diagnosis not present

## 2018-01-06 DIAGNOSIS — N186 End stage renal disease: Secondary | ICD-10-CM | POA: Diagnosis not present

## 2018-01-06 DIAGNOSIS — K659 Peritonitis, unspecified: Secondary | ICD-10-CM | POA: Diagnosis not present

## 2018-01-06 DIAGNOSIS — D631 Anemia in chronic kidney disease: Secondary | ICD-10-CM | POA: Diagnosis not present

## 2018-01-06 DIAGNOSIS — D509 Iron deficiency anemia, unspecified: Secondary | ICD-10-CM | POA: Diagnosis not present

## 2018-01-07 DIAGNOSIS — N2581 Secondary hyperparathyroidism of renal origin: Secondary | ICD-10-CM | POA: Diagnosis not present

## 2018-01-07 DIAGNOSIS — K659 Peritonitis, unspecified: Secondary | ICD-10-CM | POA: Diagnosis not present

## 2018-01-07 DIAGNOSIS — D631 Anemia in chronic kidney disease: Secondary | ICD-10-CM | POA: Diagnosis not present

## 2018-01-07 DIAGNOSIS — R600 Localized edema: Secondary | ICD-10-CM | POA: Diagnosis not present

## 2018-01-07 DIAGNOSIS — D509 Iron deficiency anemia, unspecified: Secondary | ICD-10-CM | POA: Diagnosis not present

## 2018-01-07 DIAGNOSIS — M25571 Pain in right ankle and joints of right foot: Secondary | ICD-10-CM | POA: Diagnosis not present

## 2018-01-07 DIAGNOSIS — Z6821 Body mass index (BMI) 21.0-21.9, adult: Secondary | ICD-10-CM | POA: Diagnosis not present

## 2018-01-07 DIAGNOSIS — M79671 Pain in right foot: Secondary | ICD-10-CM | POA: Diagnosis not present

## 2018-01-07 DIAGNOSIS — N186 End stage renal disease: Secondary | ICD-10-CM | POA: Diagnosis not present

## 2018-01-07 DIAGNOSIS — E440 Moderate protein-calorie malnutrition: Secondary | ICD-10-CM | POA: Diagnosis not present

## 2018-01-08 DIAGNOSIS — D631 Anemia in chronic kidney disease: Secondary | ICD-10-CM | POA: Diagnosis not present

## 2018-01-08 DIAGNOSIS — D509 Iron deficiency anemia, unspecified: Secondary | ICD-10-CM | POA: Diagnosis not present

## 2018-01-08 DIAGNOSIS — N2581 Secondary hyperparathyroidism of renal origin: Secondary | ICD-10-CM | POA: Diagnosis not present

## 2018-01-08 DIAGNOSIS — E440 Moderate protein-calorie malnutrition: Secondary | ICD-10-CM | POA: Diagnosis not present

## 2018-01-08 DIAGNOSIS — N186 End stage renal disease: Secondary | ICD-10-CM | POA: Diagnosis not present

## 2018-01-08 DIAGNOSIS — K659 Peritonitis, unspecified: Secondary | ICD-10-CM | POA: Diagnosis not present

## 2018-01-09 DIAGNOSIS — D509 Iron deficiency anemia, unspecified: Secondary | ICD-10-CM | POA: Diagnosis not present

## 2018-01-09 DIAGNOSIS — N2581 Secondary hyperparathyroidism of renal origin: Secondary | ICD-10-CM | POA: Diagnosis not present

## 2018-01-09 DIAGNOSIS — E440 Moderate protein-calorie malnutrition: Secondary | ICD-10-CM | POA: Diagnosis not present

## 2018-01-09 DIAGNOSIS — D631 Anemia in chronic kidney disease: Secondary | ICD-10-CM | POA: Diagnosis not present

## 2018-01-09 DIAGNOSIS — N186 End stage renal disease: Secondary | ICD-10-CM | POA: Diagnosis not present

## 2018-01-09 DIAGNOSIS — K659 Peritonitis, unspecified: Secondary | ICD-10-CM | POA: Diagnosis not present

## 2018-01-10 DIAGNOSIS — N186 End stage renal disease: Secondary | ICD-10-CM | POA: Diagnosis not present

## 2018-01-10 DIAGNOSIS — E440 Moderate protein-calorie malnutrition: Secondary | ICD-10-CM | POA: Diagnosis not present

## 2018-01-10 DIAGNOSIS — K659 Peritonitis, unspecified: Secondary | ICD-10-CM | POA: Diagnosis not present

## 2018-01-10 DIAGNOSIS — N2581 Secondary hyperparathyroidism of renal origin: Secondary | ICD-10-CM | POA: Diagnosis not present

## 2018-01-10 DIAGNOSIS — D509 Iron deficiency anemia, unspecified: Secondary | ICD-10-CM | POA: Diagnosis not present

## 2018-01-10 DIAGNOSIS — D631 Anemia in chronic kidney disease: Secondary | ICD-10-CM | POA: Diagnosis not present

## 2018-01-11 DIAGNOSIS — E440 Moderate protein-calorie malnutrition: Secondary | ICD-10-CM | POA: Diagnosis not present

## 2018-01-11 DIAGNOSIS — N186 End stage renal disease: Secondary | ICD-10-CM | POA: Diagnosis not present

## 2018-01-11 DIAGNOSIS — D509 Iron deficiency anemia, unspecified: Secondary | ICD-10-CM | POA: Diagnosis not present

## 2018-01-11 DIAGNOSIS — N2581 Secondary hyperparathyroidism of renal origin: Secondary | ICD-10-CM | POA: Diagnosis not present

## 2018-01-11 DIAGNOSIS — D631 Anemia in chronic kidney disease: Secondary | ICD-10-CM | POA: Diagnosis not present

## 2018-01-11 DIAGNOSIS — K659 Peritonitis, unspecified: Secondary | ICD-10-CM | POA: Diagnosis not present

## 2018-01-12 DIAGNOSIS — K659 Peritonitis, unspecified: Secondary | ICD-10-CM | POA: Diagnosis not present

## 2018-01-12 DIAGNOSIS — N186 End stage renal disease: Secondary | ICD-10-CM | POA: Diagnosis not present

## 2018-01-12 DIAGNOSIS — D509 Iron deficiency anemia, unspecified: Secondary | ICD-10-CM | POA: Diagnosis not present

## 2018-01-12 DIAGNOSIS — N2581 Secondary hyperparathyroidism of renal origin: Secondary | ICD-10-CM | POA: Diagnosis not present

## 2018-01-12 DIAGNOSIS — D631 Anemia in chronic kidney disease: Secondary | ICD-10-CM | POA: Diagnosis not present

## 2018-01-12 DIAGNOSIS — E440 Moderate protein-calorie malnutrition: Secondary | ICD-10-CM | POA: Diagnosis not present

## 2018-01-13 DIAGNOSIS — N186 End stage renal disease: Secondary | ICD-10-CM | POA: Diagnosis not present

## 2018-01-13 DIAGNOSIS — N2581 Secondary hyperparathyroidism of renal origin: Secondary | ICD-10-CM | POA: Diagnosis not present

## 2018-01-13 DIAGNOSIS — D631 Anemia in chronic kidney disease: Secondary | ICD-10-CM | POA: Diagnosis not present

## 2018-01-13 DIAGNOSIS — K659 Peritonitis, unspecified: Secondary | ICD-10-CM | POA: Diagnosis not present

## 2018-01-13 DIAGNOSIS — D509 Iron deficiency anemia, unspecified: Secondary | ICD-10-CM | POA: Diagnosis not present

## 2018-01-13 DIAGNOSIS — E440 Moderate protein-calorie malnutrition: Secondary | ICD-10-CM | POA: Diagnosis not present

## 2018-01-14 DIAGNOSIS — E440 Moderate protein-calorie malnutrition: Secondary | ICD-10-CM | POA: Diagnosis not present

## 2018-01-14 DIAGNOSIS — K659 Peritonitis, unspecified: Secondary | ICD-10-CM | POA: Diagnosis not present

## 2018-01-14 DIAGNOSIS — N2581 Secondary hyperparathyroidism of renal origin: Secondary | ICD-10-CM | POA: Diagnosis not present

## 2018-01-14 DIAGNOSIS — D509 Iron deficiency anemia, unspecified: Secondary | ICD-10-CM | POA: Diagnosis not present

## 2018-01-14 DIAGNOSIS — D631 Anemia in chronic kidney disease: Secondary | ICD-10-CM | POA: Diagnosis not present

## 2018-01-14 DIAGNOSIS — N186 End stage renal disease: Secondary | ICD-10-CM | POA: Diagnosis not present

## 2018-01-15 DIAGNOSIS — N186 End stage renal disease: Secondary | ICD-10-CM | POA: Diagnosis not present

## 2018-01-15 DIAGNOSIS — E440 Moderate protein-calorie malnutrition: Secondary | ICD-10-CM | POA: Diagnosis not present

## 2018-01-15 DIAGNOSIS — N2581 Secondary hyperparathyroidism of renal origin: Secondary | ICD-10-CM | POA: Diagnosis not present

## 2018-01-15 DIAGNOSIS — D509 Iron deficiency anemia, unspecified: Secondary | ICD-10-CM | POA: Diagnosis not present

## 2018-01-15 DIAGNOSIS — D631 Anemia in chronic kidney disease: Secondary | ICD-10-CM | POA: Diagnosis not present

## 2018-01-15 DIAGNOSIS — K659 Peritonitis, unspecified: Secondary | ICD-10-CM | POA: Diagnosis not present

## 2018-01-16 DIAGNOSIS — N2581 Secondary hyperparathyroidism of renal origin: Secondary | ICD-10-CM | POA: Diagnosis not present

## 2018-01-16 DIAGNOSIS — K659 Peritonitis, unspecified: Secondary | ICD-10-CM | POA: Diagnosis not present

## 2018-01-16 DIAGNOSIS — E440 Moderate protein-calorie malnutrition: Secondary | ICD-10-CM | POA: Diagnosis not present

## 2018-01-16 DIAGNOSIS — D631 Anemia in chronic kidney disease: Secondary | ICD-10-CM | POA: Diagnosis not present

## 2018-01-16 DIAGNOSIS — D509 Iron deficiency anemia, unspecified: Secondary | ICD-10-CM | POA: Diagnosis not present

## 2018-01-16 DIAGNOSIS — N186 End stage renal disease: Secondary | ICD-10-CM | POA: Diagnosis not present

## 2018-01-17 DIAGNOSIS — E440 Moderate protein-calorie malnutrition: Secondary | ICD-10-CM | POA: Diagnosis not present

## 2018-01-17 DIAGNOSIS — D509 Iron deficiency anemia, unspecified: Secondary | ICD-10-CM | POA: Diagnosis not present

## 2018-01-17 DIAGNOSIS — N186 End stage renal disease: Secondary | ICD-10-CM | POA: Diagnosis not present

## 2018-01-17 DIAGNOSIS — N2581 Secondary hyperparathyroidism of renal origin: Secondary | ICD-10-CM | POA: Diagnosis not present

## 2018-01-17 DIAGNOSIS — D631 Anemia in chronic kidney disease: Secondary | ICD-10-CM | POA: Diagnosis not present

## 2018-01-17 DIAGNOSIS — K659 Peritonitis, unspecified: Secondary | ICD-10-CM | POA: Diagnosis not present

## 2018-01-18 DIAGNOSIS — N186 End stage renal disease: Secondary | ICD-10-CM | POA: Diagnosis not present

## 2018-01-18 DIAGNOSIS — D509 Iron deficiency anemia, unspecified: Secondary | ICD-10-CM | POA: Diagnosis not present

## 2018-01-18 DIAGNOSIS — D631 Anemia in chronic kidney disease: Secondary | ICD-10-CM | POA: Diagnosis not present

## 2018-01-18 DIAGNOSIS — K659 Peritonitis, unspecified: Secondary | ICD-10-CM | POA: Diagnosis not present

## 2018-01-18 DIAGNOSIS — N2581 Secondary hyperparathyroidism of renal origin: Secondary | ICD-10-CM | POA: Diagnosis not present

## 2018-01-18 DIAGNOSIS — E440 Moderate protein-calorie malnutrition: Secondary | ICD-10-CM | POA: Diagnosis not present

## 2018-01-19 DIAGNOSIS — D509 Iron deficiency anemia, unspecified: Secondary | ICD-10-CM | POA: Diagnosis not present

## 2018-01-19 DIAGNOSIS — N2581 Secondary hyperparathyroidism of renal origin: Secondary | ICD-10-CM | POA: Diagnosis not present

## 2018-01-19 DIAGNOSIS — E440 Moderate protein-calorie malnutrition: Secondary | ICD-10-CM | POA: Diagnosis not present

## 2018-01-19 DIAGNOSIS — N186 End stage renal disease: Secondary | ICD-10-CM | POA: Diagnosis not present

## 2018-01-19 DIAGNOSIS — D631 Anemia in chronic kidney disease: Secondary | ICD-10-CM | POA: Diagnosis not present

## 2018-01-19 DIAGNOSIS — K659 Peritonitis, unspecified: Secondary | ICD-10-CM | POA: Diagnosis not present

## 2018-01-20 DIAGNOSIS — D631 Anemia in chronic kidney disease: Secondary | ICD-10-CM | POA: Diagnosis not present

## 2018-01-20 DIAGNOSIS — D509 Iron deficiency anemia, unspecified: Secondary | ICD-10-CM | POA: Diagnosis not present

## 2018-01-20 DIAGNOSIS — N186 End stage renal disease: Secondary | ICD-10-CM | POA: Diagnosis not present

## 2018-01-20 DIAGNOSIS — K659 Peritonitis, unspecified: Secondary | ICD-10-CM | POA: Diagnosis not present

## 2018-01-20 DIAGNOSIS — E440 Moderate protein-calorie malnutrition: Secondary | ICD-10-CM | POA: Diagnosis not present

## 2018-01-20 DIAGNOSIS — N2581 Secondary hyperparathyroidism of renal origin: Secondary | ICD-10-CM | POA: Diagnosis not present

## 2018-01-21 DIAGNOSIS — K659 Peritonitis, unspecified: Secondary | ICD-10-CM | POA: Diagnosis not present

## 2018-01-21 DIAGNOSIS — N186 End stage renal disease: Secondary | ICD-10-CM | POA: Diagnosis not present

## 2018-01-21 DIAGNOSIS — R82998 Other abnormal findings in urine: Secondary | ICD-10-CM | POA: Diagnosis not present

## 2018-01-21 DIAGNOSIS — E440 Moderate protein-calorie malnutrition: Secondary | ICD-10-CM | POA: Diagnosis not present

## 2018-01-21 DIAGNOSIS — D509 Iron deficiency anemia, unspecified: Secondary | ICD-10-CM | POA: Diagnosis not present

## 2018-01-21 DIAGNOSIS — D631 Anemia in chronic kidney disease: Secondary | ICD-10-CM | POA: Diagnosis not present

## 2018-01-21 DIAGNOSIS — E7849 Other hyperlipidemia: Secondary | ICD-10-CM | POA: Diagnosis not present

## 2018-01-21 DIAGNOSIS — N2581 Secondary hyperparathyroidism of renal origin: Secondary | ICD-10-CM | POA: Diagnosis not present

## 2018-01-21 DIAGNOSIS — E890 Postprocedural hypothyroidism: Secondary | ICD-10-CM | POA: Diagnosis not present

## 2018-01-22 ENCOUNTER — Encounter (HOSPITAL_COMMUNITY): Payer: Self-pay | Admitting: *Deleted

## 2018-01-22 ENCOUNTER — Other Ambulatory Visit: Payer: Self-pay

## 2018-01-22 ENCOUNTER — Inpatient Hospital Stay (HOSPITAL_COMMUNITY)
Admission: EM | Admit: 2018-01-22 | Discharge: 2018-01-25 | DRG: 682 | Disposition: A | Discharge status: Home / Self Care | Payer: Medicare Other | Attending: Internal Medicine | Admitting: Internal Medicine

## 2018-01-22 DIAGNOSIS — E039 Hypothyroidism, unspecified: Secondary | ICD-10-CM | POA: Diagnosis present

## 2018-01-22 DIAGNOSIS — Z8673 Personal history of transient ischemic attack (TIA), and cerebral infarction without residual deficits: Secondary | ICD-10-CM | POA: Diagnosis not present

## 2018-01-22 DIAGNOSIS — N2889 Other specified disorders of kidney and ureter: Secondary | ICD-10-CM | POA: Diagnosis present

## 2018-01-22 DIAGNOSIS — G40909 Epilepsy, unspecified, not intractable, without status epilepticus: Secondary | ICD-10-CM | POA: Diagnosis present

## 2018-01-22 DIAGNOSIS — E440 Moderate protein-calorie malnutrition: Secondary | ICD-10-CM | POA: Diagnosis not present

## 2018-01-22 DIAGNOSIS — I120 Hypertensive chronic kidney disease with stage 5 chronic kidney disease or end stage renal disease: Secondary | ICD-10-CM | POA: Diagnosis not present

## 2018-01-22 DIAGNOSIS — Z992 Dependence on renal dialysis: Secondary | ICD-10-CM

## 2018-01-22 DIAGNOSIS — D631 Anemia in chronic kidney disease: Secondary | ICD-10-CM | POA: Diagnosis present

## 2018-01-22 DIAGNOSIS — Z79890 Hormone replacement therapy (postmenopausal): Secondary | ICD-10-CM | POA: Diagnosis not present

## 2018-01-22 DIAGNOSIS — D649 Anemia, unspecified: Secondary | ICD-10-CM | POA: Diagnosis present

## 2018-01-22 DIAGNOSIS — N186 End stage renal disease: Secondary | ICD-10-CM | POA: Diagnosis present

## 2018-01-22 DIAGNOSIS — I69354 Hemiplegia and hemiparesis following cerebral infarction affecting left non-dominant side: Secondary | ICD-10-CM | POA: Diagnosis not present

## 2018-01-22 DIAGNOSIS — K659 Peritonitis, unspecified: Secondary | ICD-10-CM | POA: Diagnosis not present

## 2018-01-22 DIAGNOSIS — Z8674 Personal history of sudden cardiac arrest: Secondary | ICD-10-CM

## 2018-01-22 DIAGNOSIS — N2581 Secondary hyperparathyroidism of renal origin: Secondary | ICD-10-CM | POA: Diagnosis present

## 2018-01-22 DIAGNOSIS — Z905 Acquired absence of kidney: Secondary | ICD-10-CM | POA: Diagnosis not present

## 2018-01-22 DIAGNOSIS — E8889 Other specified metabolic disorders: Secondary | ICD-10-CM | POA: Diagnosis present

## 2018-01-22 DIAGNOSIS — Z8249 Family history of ischemic heart disease and other diseases of the circulatory system: Secondary | ICD-10-CM

## 2018-01-22 DIAGNOSIS — D509 Iron deficiency anemia, unspecified: Secondary | ICD-10-CM | POA: Diagnosis not present

## 2018-01-22 DIAGNOSIS — Z79899 Other long term (current) drug therapy: Secondary | ICD-10-CM | POA: Diagnosis not present

## 2018-01-22 DIAGNOSIS — I151 Hypertension secondary to other renal disorders: Secondary | ICD-10-CM | POA: Diagnosis not present

## 2018-01-22 DIAGNOSIS — Z888 Allergy status to other drugs, medicaments and biological substances status: Secondary | ICD-10-CM

## 2018-01-22 DIAGNOSIS — E785 Hyperlipidemia, unspecified: Secondary | ICD-10-CM | POA: Diagnosis present

## 2018-01-22 DIAGNOSIS — Z7982 Long term (current) use of aspirin: Secondary | ICD-10-CM

## 2018-01-22 LAB — COMPREHENSIVE METABOLIC PANEL
ALBUMIN: 2.3 g/dL — AB (ref 3.5–5.0)
ALK PHOS: 43 U/L (ref 38–126)
ALT: 11 U/L (ref 0–44)
AST: 17 U/L (ref 15–41)
Anion gap: 20 — ABNORMAL HIGH (ref 5–15)
BUN: 67 mg/dL — ABNORMAL HIGH (ref 6–20)
CALCIUM: 7.8 mg/dL — AB (ref 8.9–10.3)
CHLORIDE: 99 mmol/L (ref 98–111)
CO2: 22 mmol/L (ref 22–32)
CREATININE: 15.58 mg/dL — AB (ref 0.44–1.00)
GFR calc Af Amer: 3 mL/min — ABNORMAL LOW (ref >60)
GFR calc non Af Amer: 2 mL/min — ABNORMAL LOW (ref >60)
GLUCOSE: 101 mg/dL — AB (ref 70–99)
Potassium: 4.6 mmol/L (ref 3.5–5.1)
SODIUM: 141 mmol/L (ref 135–145)
Total Bilirubin: 0.6 mg/dL (ref 0.3–1.2)
Total Protein: 5.7 g/dL — ABNORMAL LOW (ref 6.5–8.1)

## 2018-01-22 LAB — PREPARE RBC (CROSSMATCH)

## 2018-01-22 LAB — PROTIME-INR
INR: 1.13
Prothrombin Time: 14.4 seconds (ref 11.4–15.2)

## 2018-01-22 LAB — CBC
HCT: 18 % — ABNORMAL LOW (ref 36.0–46.0)
HEMOGLOBIN: 5.5 g/dL — AB (ref 12.0–15.0)
MCH: 31.4 pg (ref 26.0–34.0)
MCHC: 30.6 g/dL (ref 30.0–36.0)
MCV: 102.9 fL — AB (ref 78.0–100.0)
PLATELETS: 326 10*3/uL (ref 150–400)
RBC: 1.75 MIL/uL — ABNORMAL LOW (ref 3.87–5.11)
RDW: 13.5 % (ref 11.5–15.5)
WBC: 10.4 10*3/uL (ref 4.0–10.5)

## 2018-01-22 LAB — IRON AND TIBC
Iron: 52 ug/dL (ref 28–170)
Saturation Ratios: 25 % (ref 10.4–31.8)
TIBC: 204 ug/dL — ABNORMAL LOW (ref 250–450)
UIBC: 152 ug/dL

## 2018-01-22 LAB — POC OCCULT BLOOD, ED: Fecal Occult Bld: NEGATIVE

## 2018-01-22 LAB — FERRITIN: Ferritin: 792 ng/mL — ABNORMAL HIGH (ref 11–307)

## 2018-01-22 LAB — RETICULOCYTES
RBC.: 1.77 MIL/uL — ABNORMAL LOW (ref 3.87–5.11)
Retic Count, Absolute: 56.6 K/uL (ref 19.0–186.0)
Retic Ct Pct: 3.2 % — ABNORMAL HIGH (ref 0.4–3.1)

## 2018-01-22 LAB — FOLATE: Folate: 7.3 ng/mL (ref >5.9)

## 2018-01-22 LAB — VITAMIN B12: Vitamin B-12: 935 pg/mL — ABNORMAL HIGH (ref 180–914)

## 2018-01-22 LAB — SAMPLE TO BLOOD BANK

## 2018-01-22 MED ORDER — LEVETIRACETAM 500 MG PO TABS
500.0000 mg | ORAL_TABLET | Freq: Two times a day (BID) | ORAL | Status: DC
  Administered 2018-01-23 – 2018-01-25 (×6): 500 mg via ORAL
  Filled 2018-01-22 (×6): qty 1

## 2018-01-22 MED ORDER — SODIUM CHLORIDE 0.9% FLUSH: 3.0000 mL | INTRAVENOUS | Status: DC | PRN

## 2018-01-22 MED ORDER — ONDANSETRON HCL 4 MG PO TABS: 4.0000 mg | ORAL_TABLET | Freq: Four times a day (QID) | ORAL | Status: DC | PRN

## 2018-01-22 MED ORDER — ACETAMINOPHEN 325 MG PO TABS: 650.0000 mg | ORAL_TABLET | Freq: Four times a day (QID) | ORAL | Status: DC | PRN

## 2018-01-22 MED ORDER — CINACALCET HCL 30 MG PO TABS
60.0000 mg | ORAL_TABLET | ORAL | Status: DC
  Administered 2018-01-24: 60 mg via ORAL
  Filled 2018-01-22: qty 2

## 2018-01-22 MED ORDER — HYDROCODONE-ACETAMINOPHEN 5-325 MG PO TABS
1.0000 | ORAL_TABLET | ORAL | Status: DC | PRN
  Filled 2018-01-22: qty 2

## 2018-01-22 MED ORDER — SODIUM CHLORIDE 0.9% FLUSH
3.0000 mL | Freq: Two times a day (BID) | INTRAVENOUS | Status: DC
  Administered 2018-01-24: 3 mL via INTRAVENOUS

## 2018-01-22 MED ORDER — ACETAMINOPHEN 650 MG RE SUPP: 650.0000 mg | Freq: Four times a day (QID) | RECTAL | Status: DC | PRN

## 2018-01-22 MED ORDER — LEVOTHYROXINE SODIUM 112 MCG PO TABS
112.0000 ug | ORAL_TABLET | Freq: Every day | ORAL | Status: DC
  Administered 2018-01-23 – 2018-01-25 (×3): 112 ug via ORAL
  Filled 2018-01-22 (×3): qty 1

## 2018-01-22 MED ORDER — RENA-VITE PO TABS
1.0000 | ORAL_TABLET | Freq: Every day | ORAL | Status: DC
  Administered 2018-01-23 – 2018-01-24 (×2): 1 via ORAL
  Filled 2018-01-22 (×2): qty 1

## 2018-01-22 MED ORDER — AMLODIPINE BESYLATE 10 MG PO TABS
10.0000 mg | ORAL_TABLET | Freq: Every day | ORAL | Status: DC
  Administered 2018-01-23 – 2018-01-25 (×3): 10 mg via ORAL
  Filled 2018-01-22 (×3): qty 1

## 2018-01-22 MED ORDER — SODIUM CHLORIDE 0.9% IV SOLUTION
Freq: Once | INTRAVENOUS | Status: AC
  Administered 2018-01-22: via INTRAVENOUS

## 2018-01-22 MED ORDER — SODIUM CHLORIDE 0.9 % IV SOLN
250.0000 mL | INTRAVENOUS | Status: DC | PRN
  Administered 2018-01-23: 250 mL via INTRAVENOUS

## 2018-01-22 MED ORDER — CALCIUM ACETATE (PHOS BINDER) 667 MG PO CAPS
1334.0000 mg | ORAL_CAPSULE | Freq: Three times a day (TID) | ORAL | Status: DC
  Administered 2018-01-23 – 2018-01-25 (×6): 1334 mg via ORAL
  Filled 2018-01-22 (×8): qty 2

## 2018-01-22 MED ORDER — PANTOPRAZOLE SODIUM 40 MG PO TBEC
40.0000 mg | DELAYED_RELEASE_TABLET | Freq: Two times a day (BID) | ORAL | Status: DC
  Administered 2018-01-23 – 2018-01-25 (×6): 40 mg via ORAL
  Filled 2018-01-22 (×6): qty 1

## 2018-01-22 MED ORDER — GENTAMICIN SULFATE 0.1 % EX CREA
1.0000 "application " | TOPICAL_CREAM | Freq: Every day | CUTANEOUS | Status: DC
  Administered 2018-01-23 – 2018-01-25 (×3): 1 via TOPICAL
  Filled 2018-01-22: qty 15

## 2018-01-22 MED ORDER — ONDANSETRON HCL 4 MG/2ML IJ SOLN: 4.0000 mg | Freq: Four times a day (QID) | INTRAMUSCULAR | Status: DC | PRN

## 2018-01-22 MED ORDER — GABAPENTIN 100 MG PO CAPS
100.0000 mg | ORAL_CAPSULE | Freq: Every day | ORAL | Status: DC
  Administered 2018-01-23 – 2018-01-24 (×3): 100 mg via ORAL
  Filled 2018-01-22 (×3): qty 1

## 2018-01-22 MED ORDER — CARVEDILOL 25 MG PO TABS
25.0000 mg | ORAL_TABLET | Freq: Two times a day (BID) | ORAL | Status: DC
  Administered 2018-01-23: 25 mg via ORAL
  Filled 2018-01-22: qty 1

## 2018-01-22 MED ORDER — SODIUM CHLORIDE 0.9% FLUSH
3.0000 mL | Freq: Two times a day (BID) | INTRAVENOUS | Status: DC
  Administered 2018-01-23: 3 mL via INTRAVENOUS
  Administered 2018-01-23 – 2018-01-24 (×2): 10 mL via INTRAVENOUS

## 2018-01-22 NOTE — ED Triage Notes (Signed)
No black stools  Hx of anemia

## 2018-01-22 NOTE — H&P (Signed)
History and Physical    Terry Juarez CBJ:[id number] DOB: 11-18-1965 DOA: 01/22/2018  PCP: [name], MD   Patient coming from: Home  Chief Complaint: Fatigue, low Hgb on outpatient labs   HPI: Terry Juarez is a 52 y.o. female with medical history significant for end-stage renal disease on peritoneal dialysis, history of CVA with left-sided motor deficits, hypothyroidism, hypertension, and history of GI bleed, now presenting to the emergency department for evaluation of fatigue and low hemoglobin on outpatient blood work.  Patient reports that she had been in her usual state of health until the insidious development of fatigue and generalized weakness over the past 2 weeks.  She had a dialysis nurse collect blood yesterday and was notified today that her hemoglobin was low.  She denies melena or hematochezia and has not noted any other bleeding.  There has not been any issues with her PD at home and she reports chronic stable bilateral lower extremity swelling.  Reports mild exertional dyspnea, but no cough or chest pain.  ED Course: Upon arrival to the ED, patient is found to be afebrile, saturating well on room air, and with vitals otherwise normal.  Chemistry panel features a normal potassium and bicarbonate and BUN of 67, slightly higher than priors.  CBC is notable for hemoglobin of 5.5, down from 10.2 in April.  INR is normal and fecal occult blood testing is negative.  Anemia panel was sent by the ED provider and type and screen was performed.  Patient remains hemodynamically stable, in no apparent respiratory distress, and will be admitted for ongoing evaluation and management of symptomatic anemia.  Review of Systems:  All other systems reviewed and apart from HPI, are negative.  Past Medical History:  Diagnosis Date  . Anemia   . Bruises easily   . Dialysis patient ([place])   . ESRD on dialysis ([place])   . Hyperlipidemia   . Hypertension   . Renal disorder    rt renal  mass / < functioning of left kidney - being prepared for possible dialysis  . Right renal mass   . Stroke [facility])     Past Surgical History:  Procedure Laterality Date  . AV FISTULA PLACEMENT Right 07/11/2014   Procedure: ARTERIOVENOUS (AV) FISTULA CREATION RIGHT ARM BRACHIO-CEPHALIC WITH ATTEMPTED RADIO-CEPHALIC (AV) FISTULA;  Surgeon: Mal Misty, MD;  Location: [place];  Service: Vascular;  Laterality: Right;  . COLONOSCOPY WITH PROPOFOL N/A 09/04/2016   Procedure: COLONOSCOPY WITH PROPOFOL;  Surgeon: Carol Ada, MD;  [facility];  Service: Endoscopy;  Laterality: N/A;  . ECTOPIC PREGNANCY SURGERY  1987  . ESOPHAGOGASTRODUODENOSCOPY N/A 07/30/2016   Procedure: ESOPHAGOGASTRODUODENOSCOPY (EGD);  Surgeon: Carol Ada, MD;  Location: [facility] ENDOSCOPY;  Service: Endoscopy;  Laterality: N/A;  Bedside  . ESOPHAGOGASTRODUODENOSCOPY (EGD) WITH PROPOFOL N/A 09/04/2016   Procedure: ESOPHAGOGASTRODUODENOSCOPY (EGD) WITH PROPOFOL;  Surgeon: Carol Ada, MD;  [facility];  Service: Endoscopy;  Laterality: N/A;  . ESOPHAGOGASTRODUODENOSCOPY (EGD) WITH PROPOFOL N/A 08/27/2017   Procedure: ESOPHAGOGASTRODUODENOSCOPY (EGD) WITH PROPOFOL;  Surgeon: Carol Ada, MD;  [facility];  Service: Endoscopy;  Laterality: N/A;  . INSERTION OF DIALYSIS CATHETER Right 07/11/2014   Procedure: INSERTION OF DIALYSIS CATHETER IN RIGHT INTERNAL JUGULAR ;  Surgeon: Mal Misty, MD;  Location: [place];  Service: Vascular;  Laterality: Right;  . LAPAROSCOPIC NEPHRECTOMY Right 07/25/2014   Procedure: RIGHT LAPAROSCOPIC RADICAL NEPHRECTOMY ;  Surgeon: Ardis Hughs, MD;  [facility];  Service: Urology;  Laterality: Right;  . PATCH ANGIOPLASTY Right 07/11/2014   Procedure: PATCH ANGIOPLASTY OF RIGHT RADIAL ARTERY USING CEPHALIC VEIN.;  Surgeon: Mal Misty, MD;  Location: [place];  Service: Vascular;  Laterality: Right;  . THROMBECTOMY W/ EMBOLECTOMY Right 07/11/2014   Procedure: THROMBECTOMY OF  RIGHT RADIAL ARTERY  ;  Surgeon: Mal Misty, MD;  Location: [place];  Service: Vascular;  Laterality: Right;     reports that she has never smoked. She has never used smokeless tobacco. She reports that she drinks about 1.2 oz of alcohol per week. She reports that she does not use drugs.  Allergies  Allergen Reactions  . Lisinopril Cough    Family History  Problem Relation Age of Onset  . Throat cancer Mother        smoked  . Hypertension Father      Prior to Admission medications   Medication Sig Start Date End Date Taking? Authorizing Provider  acetaminophen (TYLENOL) 500 MG tablet Take 1,000 mg by mouth every 6 (six) hours as needed for moderate pain or headache.   Yes [provider]  amLODipine (NORVASC) 10 MG tablet Take 1 tablet (10 mg total) by mouth daily. 02/01/17  Yes Love, Ivan Anchors, PA-C  aspirin EC 81 MG tablet Take 1 tablet (81 mg total) by mouth daily. 04/08/17  Yes Rosalin Hawking, MD  calcium acetate (PHOSLO) 667 MG capsule Take 2 capsules (1,334 mg total) by mouth 3 (three) times daily with meals. Patient taking differently: Take 1,334 mg by mouth See admin instructions. Take 2 capsules (1,334 mg) by mouth up to four times daily - with meals and snacks 02/01/17  Yes Love, Pamela S, PA-C  carvedilol (COREG) 25 MG tablet Take 25 mg by mouth 2 (two) times daily.  08/09/17  Yes [provider]  cinacalcet (SENSIPAR) 60 MG tablet Take 60 mg by mouth every Monday, Wednesday, and Friday.    Yes [provider]  gabapentin (NEURONTIN) 100 MG capsule Take 100 mg by mouth at bedtime. 12/21/17  Yes [provider]  gentamicin cream (GARAMYCIN) 0.1 % Apply 1 application topically daily. 02/01/17  Yes Love, Ivan Anchors, PA-C  heparin 1000 UNIT/ML injection Inject 5,000 Units into the vein as needed (fiber in her catheter). In 5L of fluid 09/14/17  Yes [provider]  levETIRAcetam (KEPPRA) 500 MG tablet Take 1 tablet (500 mg total) by mouth 2 (two)  times daily. 02/01/17  Yes Love, Ivan Anchors, PA-C  levothyroxine (SYNTHROID, LEVOTHROID) 112 MCG tablet Take 112 mcg by mouth daily. 12/22/17  Yes [provider]  multivitamin (RENA-VIT) TABS tablet Take 1 tablet by mouth daily.   Yes [provider]  pantoprazole (PROTONIX) 40 MG tablet Take 1 tablet (40 mg total) by mouth 2 (two) times daily. 02/01/17  Yes Flora Lipps    Physical Exam: Vitals:   01/22/18 1803 01/22/18 2000 01/22/18 2015 01/22/18 2145  BP: 127/73 (!) 147/89 (!) 143/90 (!) 144/75  Pulse: 80 77 77 84  Resp: 16   16  Temp: 98.5 F (36.9 C)     TempSrc: Oral     SpO2: 100% 99% 99% 98%  Weight:      Height:          Constitutional: NAD, calm  Eyes: PERTLA, lids and conjunctivae normal ENMT: Mucous membranes are moist. Posterior pharynx clear of any exudate or lesions.   Neck: normal, supple, no masses, no thyromegaly Respiratory: clear to auscultation bilaterally, no wheezing,  no crackles. Normal respiratory effort.   Cardiovascular: S1 & S2 heard, regular rate and rhythm. 1+ pretibial pitting edema bilaterally. No significant JVD. Abdomen: Soft, no tenderness. Bowel sounds active.  Musculoskeletal: no clubbing / cyanosis. No joint deformity upper and lower extremities.   Skin: no significant rashes, lesions, ulcers. Warm, dry, well-perfused. Neurologic: No facial asymmetry. Left-sided weakness.  Psychiatric:  Alert and oriented x 3. Pleasant and cooperative.     Labs on Admission: I have personally reviewed following labs and imaging studies  CBC: Recent Labs  Lab 01/22/18 1817  WBC 10.4  HGB 5.5*  HCT 18.0*  MCV 102.9*  PLT 431   Basic Metabolic Panel: Recent Labs  Lab 01/22/18 1817  NA 141  K 4.6  CL 99  CO2 22  GLUCOSE 101*  BUN 67*  CREATININE 15.58*  CALCIUM 7.8*   GFR: Estimated Creatinine Clearance: 3.8 mL/min (A) (by C-G formula based on SCr of 15.58 mg/dL (H)). Liver Function Tests: Recent Labs  Lab  01/22/18 1817  AST 17  ALT 11  ALKPHOS 43  BILITOT 0.6  PROT 5.7*  ALBUMIN 2.3*   No results for input(s): LIPASE, AMYLASE in the last 168 hours. No results for input(s): AMMONIA in the last 168 hours. Coagulation Profile: Recent Labs  Lab 01/22/18 1817  INR 1.13   Cardiac Enzymes: No results for input(s): CKTOTAL, CKMB, CKMBINDEX, TROPONINI in the last 168 hours. BNP (last 3 results) No results for input(s): PROBNP in the last 8760 hours. HbA1C: No results for input(s): HGBA1C in the last 72 hours. CBG: No results for input(s): GLUCAP in the last 168 hours. Lipid Profile: No results for input(s): CHOL, HDL, LDLCALC, TRIG, CHOLHDL, LDLDIRECT in the last 72 hours. Thyroid Function Tests: No results for input(s): TSH, T4TOTAL, FREET4, T3FREE, THYROIDAB in the last 72 hours. Anemia Panel: No results for input(s): VITAMINB12, FOLATE, FERRITIN, TIBC, IRON, RETICCTPCT in the last 72 hours. Urine analysis:    Component Value Date/Time   COLORURINE YELLOW 01/09/2017 1736   APPEARANCEUR CLOUDY (A) 01/09/2017 1736   LABSPEC 1.016 01/09/2017 1736   PHURINE 5.0 01/09/2017 1736   GLUCOSEU NEGATIVE 01/09/2017 1736   HGBUR SMALL (A) 01/09/2017 1736   BILIRUBINUR NEGATIVE 01/09/2017 1736   KETONESUR NEGATIVE 01/09/2017 1736   PROTEINUR 100 (A) 01/09/2017 1736   UROBILINOGEN 0.2 05/15/2014 1717   NITRITE NEGATIVE 01/09/2017 1736   LEUKOCYTESUR MODERATE (A) 01/09/2017 1736   Sepsis Labs: @LABRCNTIP (procalcitonin:4,lacticidven:4) )No results found for this or any previous visit (from the past 240 hour(s)).   Radiological Exams on Admission: No results found.  EKG: Not performed.   Assessment/Plan   1. Symptomatic anemia  - Presents with 2 wks of insidiously worsening fatigue, noted to have Hgb of 5.5, down from 10.2 in April  - Denies abdominal pain, melena, or hematochezia and FOBT is negative  - Anemia panel pending  - Transfuse 2 units pRBCs and check post-transfusion  H&H    2. ESRD  - She does PD at home  - No hyperkalemia, acidosis, severe HTN, or significant hypervolemia on admission   - Renally-dose medications, fluid-restrict, contact nephro for inpatient PD if she remains in hospital    3. Hx of CVA  - Has residual motor deficits on left, denies acute deficit  - Hold ASA initially given symptomatic anemia    4. Hypothyroidism  - Continue Synthroid   5. Hypertension  - BP at goal  - Continue Norvasc and Coreg as tolerated  DVT prophylaxis: SCD's  Code Status: Full  Family Communication: Discussed with patient  Consults called: None Admission status: Inpatient     Vianne Bulls, MD [facility] Pager [phone]  If 7PM-7AM, please contact night-coverage [email or web address] Password [facility]  01/22/2018, 10:36 PM

## 2018-01-22 NOTE — ED Provider Notes (Signed)
[facility] EMERGENCY DEPARTMENT Provider Note   CSN: [id number] Arrival date & time: 01/22/18  1751     History   Chief Complaint Chief Complaint  Patient presents with  . Abnormal Lab    HPI Terry Juarez is a 52 y.o. female.  HPI Patient presents with generalized weakness over the last couple weeks.  Drawn labs by PCP showed anemia.  Has a history of anemia after GI bleeds but is also on peritoneal dialysis.  She has not noticed any black stool.  States she is more shortness of breath more fatigue.  She is chronically weak on her left side from previous stroke.  No headache.  No confusion.  With Past Medical History:  Diagnosis Date  . Anemia   . Bruises easily   . Dialysis patient ([place])   . ESRD on dialysis ([place])   . Hyperlipidemia   . Hypertension   . Renal disorder    rt renal mass / < functioning of left kidney - being prepared for possible dialysis  . Right renal mass   . Stroke [facility])     Patient Active Problem List   Diagnosis Date Noted  . Left spastic hemiplegia ([place]) 08/06/2017  . Anemia due to chronic kidney disease, on chronic dialysis ([place]) 07/28/2017  . Hypertension secondary to other renal disorders 04/08/2017  . Left spastic hemiparesis ([place])   . Fall   . Slow transit constipation 01/28/2017  . Flaccid hemiplegia of left nondominant side as late effect of nontraumatic intraparenchymal hemorrhage of brain ([place])   . ESRD on dialysis ([place])   . Leukocytosis   . Hyperglycemia   . Hypertensive emergency 01/04/2017  . Intracranial hemorrhage ([place]) 01/04/2017  . Subarachnoid hematoma ([place])   . Hypothyroidism   . Benign essential HTN   . Seizure prophylaxis   . Cardiac arrest, cause unspecified ([place])   . ICH (intracerebral hemorrhage) ([place])   . Seizure ([place])   . PRES (posterior reversible encephalopathy syndrome)   . Subarachnoid hemorrhage 12/19/2016  . Symptomatic anemia 07/29/2016  . Cough 01/09/2016  . Elevated troponin  02/12/2015  . Headache 02/12/2015  . Syncope 02/12/2015  . ESRD on peritoneal dialysis ([place]) 02/12/2015  . Hypertension   . Hyperlipidemia   . Anemia   . Essential hypertension   . Cephalalgia   . Right renal mass 07/25/2014    Past Surgical History:  Procedure Laterality Date  . AV FISTULA PLACEMENT Right 07/11/2014   Procedure: ARTERIOVENOUS (AV) FISTULA CREATION RIGHT ARM BRACHIO-CEPHALIC WITH ATTEMPTED RADIO-CEPHALIC (AV) FISTULA;  Surgeon: Mal Misty, MD;  Location: [place];  Service: Vascular;  Laterality: Right;  . COLONOSCOPY WITH PROPOFOL N/A 09/04/2016   Procedure: COLONOSCOPY WITH PROPOFOL;  Surgeon: Carol Ada, MD;  [facility];  Service: Endoscopy;  Laterality: N/A;  . ECTOPIC PREGNANCY SURGERY  1987  . ESOPHAGOGASTRODUODENOSCOPY N/A 07/30/2016   Procedure: ESOPHAGOGASTRODUODENOSCOPY (EGD);  Surgeon: Carol Ada, MD;  Location[facility] ENDOSCOPY;  Service: Endoscopy;  Laterality: N/A;  Bedside  . ESOPHAGOGASTRODUODENOSCOPY (EGD) WITH PROPOFOL N/A 09/04/2016   Procedure: ESOPHAGOGASTRODUODENOSCOPY (EGD) WITH PROPOFOL;  Surgeon: Carol Ada, MD;  [facility];  Service: Endoscopy;  Laterality: N/A;  . ESOPHAGOGASTRODUODENOSCOPY (EGD) WITH PROPOFOL N/A 08/27/2017   Procedure: ESOPHAGOGASTRODUODENOSCOPY (EGD) WITH PROPOFOL;  Surgeon: Carol Ada, MD;  [facility];  Service: Endoscopy;  Laterality: N/A;  . INSERTION OF DIALYSIS CATHETER Right 07/11/2014   Procedure: INSERTION OF DIALYSIS CATHETER IN RIGHT INTERNAL JUGULAR ;  Surgeon: Nelda Severe  Kellie Simmering, MD;  Location: [place];  Service: Vascular;  Laterality: Right;  . LAPAROSCOPIC NEPHRECTOMY Right 07/25/2014   Procedure: RIGHT LAPAROSCOPIC RADICAL NEPHRECTOMY ;  Surgeon: Ardis Hughs, MD;  [facility];  Service: Urology;  Laterality: Right;  . PATCH ANGIOPLASTY Right 07/11/2014   Procedure: PATCH ANGIOPLASTY OF RIGHT RADIAL ARTERY USING CEPHALIC VEIN.;  Surgeon: Mal Misty, MD;  Location: [place];   Service: Vascular;  Laterality: Right;  . THROMBECTOMY W/ EMBOLECTOMY Right 07/11/2014   Procedure: THROMBECTOMY OF RIGHT RADIAL ARTERY  ;  Surgeon: Mal Misty, MD;  Location: [place];  Service: Vascular;  Laterality: Right;     OB History   None      Home Medications    Prior to Admission medications   Medication Sig Start Date End Date Taking? Authorizing Provider  acetaminophen (TYLENOL) 500 MG tablet Take 1,000 mg by mouth every 6 (six) hours as needed for moderate pain or headache.   Yes [provider]  amLODipine (NORVASC) 10 MG tablet Take 1 tablet (10 mg total) by mouth daily. 02/01/17  Yes Love, Ivan Anchors, PA-C  aspirin EC 81 MG tablet Take 1 tablet (81 mg total) by mouth daily. 04/08/17  Yes Rosalin Hawking, MD  calcium acetate (PHOSLO) 667 MG capsule Take 2 capsules (1,334 mg total) by mouth 3 (three) times daily with meals. Patient taking differently: Take 1,334 mg by mouth See admin instructions. Take 2 capsules (1,334 mg) by mouth up to four times daily - with meals and snacks 02/01/17  Yes Love, Pamela S, PA-C  carvedilol (COREG) 25 MG tablet Take 25 mg by mouth 2 (two) times daily.  08/09/17  Yes [provider]  cinacalcet (SENSIPAR) 60 MG tablet Take 60 mg by mouth every Monday, Wednesday, and Friday.    Yes [provider]  gabapentin (NEURONTIN) 100 MG capsule Take 100 mg by mouth at bedtime. 12/21/17  Yes [provider]  gentamicin cream (GARAMYCIN) 0.1 % Apply 1 application topically daily. 02/01/17  Yes Love, Ivan Anchors, PA-C  heparin 1000 UNIT/ML injection Inject 5,000 Units into the vein as needed (fiber in her catheter). In 5L of fluid 09/14/17  Yes [provider]  levETIRAcetam (KEPPRA) 500 MG tablet Take 1 tablet (500 mg total) by mouth 2 (two) times daily. 02/01/17  Yes Love, Ivan Anchors, PA-C  levothyroxine (SYNTHROID, LEVOTHROID) 112 MCG tablet Take 112 mcg by mouth daily. 12/22/17  Yes [provider]  multivitamin  (RENA-VIT) TABS tablet Take 1 tablet by mouth daily.   Yes [provider]  pantoprazole (PROTONIX) 40 MG tablet Take 1 tablet (40 mg total) by mouth 2 (two) times daily. 02/01/17  Yes Love, Ivan Anchors, PA-C  famotidine (PEPCID) 20 MG tablet Take 1 tablet (20 mg total) by mouth daily. Patient not taking: Reported on 01/22/2018 02/01/17   Love, Ivan Anchors, PA-C  FLUoxetine (PROZAC) 20 MG capsule Take 1 capsule (20 mg total) by mouth daily. Patient not taking: Reported on 01/22/2018 02/01/17   Love, Ivan Anchors, PA-C    Family History Family History  Problem Relation Age of Onset  . Throat cancer Mother        smoked  . Hypertension Father     Social History Social History   Tobacco Use  . Smoking status: Never Smoker  . Smokeless tobacco: Never Used  Substance Use Topics  . Alcohol use: Yes    Alcohol/week: 1.2 oz    Types: 2 Glasses of wine  per week    Comment: occ  . Drug use: No     Allergies   Lisinopril   Review of Systems Review of Systems  Constitutional: Negative for appetite change.  Respiratory: Positive for shortness of breath.   Cardiovascular: Negative for chest pain.  Gastrointestinal: Negative for abdominal pain and blood in stool.  Genitourinary: Negative for flank pain.  Musculoskeletal: Negative for back pain.  Neurological: Positive for light-headedness.  Hematological: Negative for adenopathy.  Psychiatric/Behavioral: Negative for confusion.     Physical Exam Updated Vital Signs BP (!) 144/75 (BP Location: Left Leg)   Pulse 84   Temp 98.5 F (36.9 C) (Oral)   Resp 16   Ht 5\' 5"  (1.651 m)   Wt 56.7 kg (125 lb)   SpO2 98%   BMI 20.80 kg/m   Physical Exam  Constitutional: She is oriented to person, place, and time. She appears well-developed.  HENT:  Head: Normocephalic.  Eyes: Pupils are equal, round, and reactive to light.  Neck: Neck supple.  Cardiovascular: Normal rate.  Abdominal: Soft. There is no tenderness.  Musculoskeletal:  She exhibits no tenderness.  Neurological: She is alert and oriented to person, place, and time.  Skin: Skin is warm. There is pallor.  Psychiatric: She has a normal mood and affect.     ED Treatments / Results  Labs (all labs ordered are listed, but only abnormal results are displayed) Labs Reviewed  COMPREHENSIVE METABOLIC PANEL - Abnormal; Notable for the following components:      Result Value   Glucose, Bld 101 (*)    BUN 67 (*)    Creatinine, Ser 15.58 (*)    Calcium 7.8 (*)    Total Protein 5.7 (*)    Albumin 2.3 (*)    GFR calc non Af Amer 2 (*)    GFR calc Af Amer 3 (*)    Anion gap 20 (*)    All other components within normal limits  CBC - Abnormal; Notable for the following components:   RBC 1.75 (*)    Hemoglobin 5.5 (*)    HCT 18.0 (*)    MCV 102.9 (*)    All other components within normal limits  PROTIME-INR  VITAMIN B12  FOLATE  IRON AND TIBC  FERRITIN  RETICULOCYTES  POC OCCULT BLOOD, ED  SAMPLE TO BLOOD BANK  TYPE AND SCREEN    EKG None  Radiology No results found.  Procedures Procedures (including critical care time)  Medications Ordered in ED Medications - No data to display   Initial Impression / Assessment and Plan / ED Course  I have reviewed the triage vital signs and the nursing notes.  Pertinent labs & imaging results that were available during my care of the patient were reviewed by me and considered in my medical decision making (see chart for details).     Patient with symptomatic anemia.  Macrocytic.  Guaiac negative.  Has had previous history of GI bleeds.  She is on peritoneal dialysis.  Will admit to hospitalist.  Final Clinical Impressions(s) / ED Diagnoses   Final diagnoses:  Symptomatic anemia  End-stage renal disease on peritoneal dialysis [facility])    ED Discharge Orders    None       Davonna Belling, MD 01/22/18 2221

## 2018-01-22 NOTE — ED Triage Notes (Signed)
Pt is tired and weak for 2 weeks  She had bloos drawn yesterday and her hgb was low.  She is a peritoneal dialysis pt.  She last had hemo dialysis one year ago and cannot have it that route again.  She has a fistula rt upper arm    No pain

## 2018-01-22 NOTE — ED Notes (Signed)
Attempted PIV without success

## 2018-01-22 NOTE — Progress Notes (Signed)
Called ER RN for report. Room ready.  

## 2018-01-23 ENCOUNTER — Other Ambulatory Visit: Payer: Self-pay

## 2018-01-23 DIAGNOSIS — Z992 Dependence on renal dialysis: Secondary | ICD-10-CM

## 2018-01-23 DIAGNOSIS — Z8673 Personal history of transient ischemic attack (TIA), and cerebral infarction without residual deficits: Secondary | ICD-10-CM

## 2018-01-23 DIAGNOSIS — K659 Peritonitis, unspecified: Secondary | ICD-10-CM | POA: Diagnosis not present

## 2018-01-23 DIAGNOSIS — D509 Iron deficiency anemia, unspecified: Secondary | ICD-10-CM | POA: Diagnosis not present

## 2018-01-23 DIAGNOSIS — N2889 Other specified disorders of kidney and ureter: Secondary | ICD-10-CM

## 2018-01-23 DIAGNOSIS — N186 End stage renal disease: Secondary | ICD-10-CM | POA: Diagnosis not present

## 2018-01-23 DIAGNOSIS — N2581 Secondary hyperparathyroidism of renal origin: Secondary | ICD-10-CM | POA: Diagnosis not present

## 2018-01-23 DIAGNOSIS — I151 Hypertension secondary to other renal disorders: Secondary | ICD-10-CM

## 2018-01-23 DIAGNOSIS — E440 Moderate protein-calorie malnutrition: Secondary | ICD-10-CM | POA: Diagnosis not present

## 2018-01-23 DIAGNOSIS — D631 Anemia in chronic kidney disease: Secondary | ICD-10-CM | POA: Diagnosis not present

## 2018-01-23 DIAGNOSIS — G40909 Epilepsy, unspecified, not intractable, without status epilepticus: Secondary | ICD-10-CM

## 2018-01-23 DIAGNOSIS — D649 Anemia, unspecified: Secondary | ICD-10-CM

## 2018-01-23 DIAGNOSIS — E039 Hypothyroidism, unspecified: Secondary | ICD-10-CM

## 2018-01-23 LAB — BASIC METABOLIC PANEL
Anion gap: 19 — ABNORMAL HIGH (ref 5–15)
BUN: 72 mg/dL — AB (ref 6–20)
CHLORIDE: 98 mmol/L (ref 98–111)
CO2: 23 mmol/L (ref 22–32)
CREATININE: 16.17 mg/dL — AB (ref 0.44–1.00)
Calcium: 7.6 mg/dL — ABNORMAL LOW (ref 8.9–10.3)
GFR calc non Af Amer: 2 mL/min — ABNORMAL LOW (ref >60)
GFR, EST AFRICAN AMERICAN: 3 mL/min — AB (ref >60)
Glucose, Bld: 87 mg/dL (ref 70–99)
Potassium: 4.3 mmol/L (ref 3.5–5.1)
SODIUM: 140 mmol/L (ref 135–145)

## 2018-01-23 LAB — HIV ANTIBODY (ROUTINE TESTING W REFLEX): HIV SCREEN 4TH GENERATION: NONREACTIVE

## 2018-01-23 MED ORDER — CARVEDILOL 25 MG PO TABS
25.0000 mg | ORAL_TABLET | Freq: Two times a day (BID) | ORAL | Status: DC
  Administered 2018-01-24 – 2018-01-25 (×3): 25 mg via ORAL
  Filled 2018-01-23 (×3): qty 1

## 2018-01-23 MED ORDER — SODIUM CHLORIDE 0.9 % IV SOLN
510.0000 mg | Freq: Once | INTRAVENOUS | Status: AC
  Administered 2018-01-23: 510 mg via INTRAVENOUS
  Filled 2018-01-23: qty 17

## 2018-01-23 MED ORDER — DELFLEX-LC/4.25% DEXTROSE 483 MOSM/L IP SOLN: Freq: Every day | INTRAPERITONEAL | Status: DC

## 2018-01-23 MED ORDER — HEPARIN 1000 UNIT/ML FOR PERITONEAL DIALYSIS: 5000.0000 [IU] | INTRAMUSCULAR | Status: DC | PRN

## 2018-01-23 MED ORDER — HEPARIN 1000 UNIT/ML FOR PERITONEAL DIALYSIS
Freq: Every day | INTRAPERITONEAL | Status: DC
  Filled 2018-01-23 (×14): qty 5000

## 2018-01-23 MED ORDER — HEPARIN 1000 UNIT/ML FOR PERITONEAL DIALYSIS: Freq: Every day | INTRAPERITONEAL | Status: DC

## 2018-01-23 NOTE — Progress Notes (Signed)
PROGRESS NOTE    Terry Juarez  LNL:[id number] DOB: 11/14/1965 DOA: 01/22/2018 PCP: [name], MD    Brief Narrative:   Terry Juarez is a 52 y.o. female with medical history significant for end-stage renal disease on peritoneal dialysis, history of CVA with left-sided motor deficits, hypothyroidism, hypertension, and history of GI bleed, now presenting to the emergency department for evaluation of fatigue and low hemoglobin on outpatient blood work.   Upon arrival to the ED, patient is found to be afebrile, saturating well on room air, and with vitals otherwise normal.  Chemistry panel features a normal potassium and bicarbonate and BUN of 67, slightly higher than priors.  CBC is notable for hemoglobin of 5.5, down from 10.2 in April.  INR is normal and fecal occult blood testing is negative.  Anemia panel was sent by the ED provider and type and screen was performed.  Patient remains hemodynamically stable, in no apparent respiratory distress, and will be admitted for ongoing evaluation and management of symptomatic anemia.    Assessment & Plan:   Principal Problem:   Symptomatic anemia Active Problems:   ESRD on peritoneal dialysis ([place])   Seizure disorder (HCC)   Hypothyroidism   Hypertension secondary to other renal disorders   History of completed stroke   Symptomatic anemia:  Admitted for blood transfusions.  She underwent 2 units of prbc transfusion and repeat H&H is 7.9.  She denies any rectal bleeding or malena or hematuria.  She reports undergoing  colonoscopy and EGD last year.  Egd was wnl. Colonoscopy showed diverticulosis.  Anemia panel shows adequate iron and ferritin.    ESRD on PD; Further management as per nephrology.    Seizure disorder; Resume home medications. Resume keppra.    Hypertension:  Well controlled.    Hypothyroidism: Resume synthroid.    H/o CVA with left residual deficits.  Stable.       DVT prophylaxis:  Lovenox.  Code Status:  Full code.  Family Communication: none at bedside. ) Disposition Plan: pending further evaluation.    Consultants:  Nephrology.    Procedures: PD   Antimicrobials: none.    Subjective: No new complaints.   Objective: Vitals:   01/23/18 0335 01/23/18 0430 01/23/18 0552 01/23/18 1050  BP: (!) 133/56 (!) 141/70 (!) 149/71 (!) 165/73  Pulse: 79 77 76 79  Resp: 18 18 20 18   Temp: 98.8 F (37.1 C) 98.4 F (36.9 C) 98.2 F (36.8 C) 99.5 F (37.5 C)  TempSrc: Oral Oral Oral Oral  SpO2: 97% 94% 99% 95%  Weight:      Height:        Intake/Output Summary (Last 24 hours) at 01/23/2018 1615 Last data filed at 01/23/2018 1100 Gross per 24 hour  Intake 1132 ml  Output 0 ml  Net 1132 ml   Filed Weights   01/22/18 1802  Weight: 56.7 kg (125 lb)    Examination:  General exam: Appears calm and comfortable  Respiratory system: Clear to auscultation. Respiratory effort normal. Cardiovascular system: S1 & S2 heard, RRR. No JVD, murmurs. No pedal edema. Gastrointestinal system: Abdomen is nondistended, soft and nontender. No organomegaly or masses felt. Normal bowel sounds heard. Central nervous system: Alert and oriented. No focal neurological deficits. Extremities: Symmetric 5 x 5 power. Skin: No rashes, lesions or ulcers Psychiatry:  Mood & affect appropriate.     Data Reviewed: I have personally reviewed following labs and imaging studies  CBC: Recent Labs  Lab 01/22/18 1817  01/23/18 0823  WBC 10.4  --   HGB 5.5* 7.9*  HCT 18.0* 24.1*  MCV 102.9*  --   PLT 326  --    Basic Metabolic Panel: Recent Labs  Lab 01/22/18 1817 01/23/18 0823  NA 141 140  K 4.6 4.3  CL 99 98  CO2 22 23  GLUCOSE 101* 87  BUN 67* 72*  CREATININE 15.58* 16.17*  CALCIUM 7.8* 7.6*   GFR: Estimated Creatinine Clearance: 3.6 mL/min (A) (by C-G formula based on SCr of 16.17 mg/dL (H)). Liver Function Tests: Recent Labs  Lab 01/22/18 1817  AST 17  ALT 11    ALKPHOS 43  BILITOT 0.6  PROT 5.7*  ALBUMIN 2.3*   No results for input(s): LIPASE, AMYLASE in the last 168 hours. No results for input(s): AMMONIA in the last 168 hours. Coagulation Profile: Recent Labs  Lab 01/22/18 1817  INR 1.13   Cardiac Enzymes: No results for input(s): CKTOTAL, CKMB, CKMBINDEX, TROPONINI in the last 168 hours. BNP (last 3 results) No results for input(s): PROBNP in the last 8760 hours. HbA1C: No results for input(s): HGBA1C in the last 72 hours. CBG: No results for input(s): GLUCAP in the last 168 hours. Lipid Profile: No results for input(s): CHOL, HDL, LDLCALC, TRIG, CHOLHDL, LDLDIRECT in the last 72 hours. Thyroid Function Tests: No results for input(s): TSH, T4TOTAL, FREET4, T3FREE, THYROIDAB in the last 72 hours. Anemia Panel: Recent Labs    01/22/18 2226  VITAMINB12 935*  FOLATE 7.3  FERRITIN 792*  TIBC 204*  IRON 52  RETICCTPCT 3.2*   Sepsis Labs: No results for input(s): PROCALCITON, LATICACIDVEN in the last 168 hours.  No results found for this or any previous visit (from the past 240 hour(s)).       Radiology Studies: No results found.      Scheduled Meds: . amLODipine  10 mg Oral Daily  . calcium acetate  1,334 mg Oral TID WC  . carvedilol  25 mg Oral BID WC  . [START ON 01/24/2018] cinacalcet  60 mg Oral Q M,W,F  . gabapentin  100 mg Oral QHS  . gentamicin cream  1 application Topical Daily  . dianeal solution for CAPD/CCPD with heparin   Peritoneal Dialysis 5 X Daily  . levETIRAcetam  500 mg Oral BID  . levothyroxine  112 mcg Oral QAC breakfast  . multivitamin  1 tablet Oral QHS  . pantoprazole  40 mg Oral BID  . sodium chloride flush  3 mL Intravenous Q12H  . sodium chloride flush  3 mL Intravenous Q12H   Continuous Infusions: . sodium chloride 250 mL (01/23/18 1040)     LOS: 1 day    Time spent: 35 minutes.     Hosie Poisson, MD [facility] Pager [phone]  If 7PM-7AM, please contact  night-coverage [email or web address] Password [facility] 01/23/2018, 4:15 PM

## 2018-01-23 NOTE — Consult Note (Addendum)
[facility] Renal Consultation Note    Indication for Consultation:  Management of ESRD/hemodialysis; anemia, hypertension/volume and secondary hyperparathyroidism PCP:  HPI: Terry Juarez is a 52 y.o. female with ESRD on CCPD at [place]. She is followed by Dr. [name]. PMH: HTN, S/P CVA ( R posterior frontal lobe hemorrhage) with L side deficits, AOCD, SHPT. Last PD treatment 01/21/2018.   She presented to ED with 2 week history of fatigue and HGB 5.6 drawn 01/21/2018 at HD center. This is down from 12.6 12/07/2017. Repeat HGB in ED was 5.5. Labs were otherwise unremarkable. SCr 15.5 K+ 4.6 Ca 7.8 C Ca 9.16 BS 101. She has rec'd 2 units of PRBCs, repeat CBC pending. She denies tarry or bloody stools, hematemesis, hematochezia, no overt blood loss. Says she has been doing well otherwise. Still unable to use LUE. She has bilateral lower extremity edema. She was told by [facility] she should use 4.25% solution next PD treatment.   Past Medical History:  Diagnosis Date  . Anemia   . Bruises easily   . Dialysis patient ([place])   . ESRD on dialysis ([place])   . Hyperlipidemia   . Hypertension   . Renal disorder    rt renal mass / < functioning of left kidney - being prepared for possible dialysis  . Right renal mass   . Stroke [facility])    Past Surgical History:  Procedure Laterality Date  . AV FISTULA PLACEMENT Right 07/11/2014   Procedure: ARTERIOVENOUS (AV) FISTULA CREATION RIGHT ARM BRACHIO-CEPHALIC WITH ATTEMPTED RADIO-CEPHALIC (AV) FISTULA;  Surgeon: Mal Misty, MD;  Location: [place];  Service: Vascular;  Laterality: Right;  . COLONOSCOPY WITH PROPOFOL N/A 09/04/2016   Procedure: COLONOSCOPY WITH PROPOFOL;  Surgeon: Carol Ada, MD;  Location: [facility];  Service: Endoscopy;  Laterality: N/A;  . ECTOPIC PREGNANCY SURGERY  1987  . ESOPHAGOGASTRODUODENOSCOPY N/A 07/30/2016   Procedure: ESOPHAGOGASTRODUODENOSCOPY (EGD);  Surgeon: Carol Ada, MD;  Location: [facility] ENDOSCOPY;  Service: Endoscopy;  Laterality: N/A;  Bedside  . ESOPHAGOGASTRODUODENOSCOPY (EGD) WITH PROPOFOL N/A 09/04/2016   Procedure: ESOPHAGOGASTRODUODENOSCOPY (EGD) WITH PROPOFOL;  Surgeon: Carol Ada, MD;  Location: [facility];  Service: Endoscopy;  Laterality: N/A;  . ESOPHAGOGASTRODUODENOSCOPY (EGD) WITH PROPOFOL N/A 08/27/2017   Procedure: ESOPHAGOGASTRODUODENOSCOPY (EGD) WITH PROPOFOL;  Surgeon: Carol Ada, MD;  Location: [facility];  Service: Endoscopy;  Laterality: N/A;  . INSERTION OF DIALYSIS CATHETER Right 07/11/2014   Procedure: INSERTION OF DIALYSIS CATHETER IN RIGHT INTERNAL JUGULAR ;  Surgeon: Mal Misty, MD;  Location: [place];  Service: Vascular;  Laterality: Right;  . LAPAROSCOPIC NEPHRECTOMY Right 07/25/2014   Procedure: RIGHT LAPAROSCOPIC RADICAL NEPHRECTOMY ;  Surgeon: Ardis Hughs, MD;  Location: [facility];  Service: Urology;  Laterality: Right;  . PATCH ANGIOPLASTY Right 07/11/2014   Procedure: PATCH ANGIOPLASTY OF RIGHT RADIAL ARTERY USING CEPHALIC VEIN.;  Surgeon: Mal Misty, MD;  Location: [place];  Service: Vascular;  Laterality: Right;  . THROMBECTOMY W/ EMBOLECTOMY Right 07/11/2014   Procedure: THROMBECTOMY OF RIGHT RADIAL ARTERY  ;  Surgeon: Mal Misty, MD;  Location: [facility];  Service: Vascular;  Laterality: Right;   Family History  Problem Relation Age of Onset  . Throat cancer Mother        smoked  . Hypertension Father    Social History:  reports that she has never smoked. She has never used smokeless tobacco. She reports that she drinks about 1.2 oz of alcohol per week.  She reports that she does not use drugs. Allergies  Allergen Reactions  . Lisinopril Cough   Prior to Admission medications   Medication Sig Start Date End Date Taking? Authorizing Provider  acetaminophen (TYLENOL) 500 MG tablet Take 1,000 mg by mouth every 6 (six) hours as needed for moderate pain or headache.   Yes [provider]   amLODipine (NORVASC) 10 MG tablet Take 1 tablet (10 mg total) by mouth daily. 02/01/17  Yes Love, Ivan Anchors, PA-C  aspirin EC 81 MG tablet Take 1 tablet (81 mg total) by mouth daily. 04/08/17  Yes Rosalin Hawking, MD  calcium acetate (PHOSLO) 667 MG capsule Take 2 capsules (1,334 mg total) by mouth 3 (three) times daily with meals. Patient taking differently: Take 1,334 mg by mouth See admin instructions. Take 2 capsules (1,334 mg) by mouth up to four times daily - with meals and snacks 02/01/17  Yes Love, Pamela S, PA-C  carvedilol (COREG) 25 MG tablet Take 25 mg by mouth 2 (two) times daily.  08/09/17  Yes [provider]  cinacalcet (SENSIPAR) 60 MG tablet Take 60 mg by mouth every Monday, Wednesday, and Friday.    Yes [provider]  gabapentin (NEURONTIN) 100 MG capsule Take 100 mg by mouth at bedtime. 12/21/17  Yes [provider]  gentamicin cream (GARAMYCIN) 0.1 % Apply 1 application topically daily. 02/01/17  Yes Love, Ivan Anchors, PA-C  heparin 1000 UNIT/ML injection Inject 5,000 Units into the vein as needed (fiber in her catheter). In 5L of fluid 09/14/17  Yes [provider]  levETIRAcetam (KEPPRA) 500 MG tablet Take 1 tablet (500 mg total) by mouth 2 (two) times daily. 02/01/17  Yes Love, Ivan Anchors, PA-C  levothyroxine (SYNTHROID, LEVOTHROID) 112 MCG tablet Take 112 mcg by mouth daily. 12/22/17  Yes [provider]  multivitamin (RENA-VIT) TABS tablet Take 1 tablet by mouth daily.   Yes [provider]  pantoprazole (PROTONIX) 40 MG tablet Take 1 tablet (40 mg total) by mouth 2 (two) times daily. 02/01/17  Yes Love, Ivan Anchors, PA-C   Current Facility-Administered Medications  Medication Dose Route Frequency Provider Last Rate Last Dose  . 0.9 %  sodium chloride infusion  250 mL Intravenous PRN Opyd, Ilene Qua, MD      . acetaminophen (TYLENOL) tablet 650 mg  650 mg Oral Q6H PRN Opyd, Ilene Qua, MD       Or  . acetaminophen (TYLENOL) suppository 650  mg  650 mg Rectal Q6H PRN Opyd, Ilene Qua, MD      . amLODipine (NORVASC) tablet 10 mg  10 mg Oral Daily Opyd, Ilene Qua, MD      . calcium acetate (PHOSLO) capsule 1,334 mg  1,334 mg Oral TID WC Opyd, Ilene Qua, MD   1,334 mg at 01/23/18 0800  . carvedilol (COREG) tablet 25 mg  25 mg Oral BID WC Opyd, Ilene Qua, MD   25 mg at 01/23/18 0005  . [START ON 01/24/2018] cinacalcet (SENSIPAR) tablet 60 mg  60 mg Oral Q M,W,F Opyd, Timothy S, MD      . gabapentin (NEURONTIN) capsule 100 mg  100 mg Oral QHS Opyd, Ilene Qua, MD   100 mg at 01/23/18 0005  . gentamicin cream (GARAMYCIN) 0.1 % 1 application  1 application Topical Daily Opyd, Ilene Qua, MD      . HYDROcodone-acetaminophen (NORCO/VICODIN) 5-325 MG per tablet 1-2 tablet  1-2 tablet Oral Q4H PRN Opyd, Ilene Qua, MD      .  levETIRAcetam (KEPPRA) tablet 500 mg  500 mg Oral BID Opyd, Ilene Qua, MD   500 mg at 01/23/18 0005  . levothyroxine (SYNTHROID, LEVOTHROID) tablet 112 mcg  112 mcg Oral QAC breakfast Opyd, Ilene Qua, MD   112 mcg at 01/23/18 0801  . multivitamin (RENA-VIT) tablet 1 tablet  1 tablet Oral QHS Opyd, Ilene Qua, MD      . ondansetron (ZOFRAN) tablet 4 mg  4 mg Oral Q6H PRN Opyd, Ilene Qua, MD       Or  . ondansetron (ZOFRAN) injection 4 mg  4 mg Intravenous Q6H PRN Opyd, Ilene Qua, MD      . pantoprazole (PROTONIX) EC tablet 40 mg  40 mg Oral BID Opyd, Ilene Qua, MD   40 mg at 01/23/18 0005  . sodium chloride flush (NS) 0.9 % injection 3 mL  3 mL Intravenous Q12H Opyd, Timothy S, MD      . sodium chloride flush (NS) 0.9 % injection 3 mL  3 mL Intravenous Q12H Opyd, Ilene Qua, MD      . sodium chloride flush (NS) 0.9 % injection 3 mL  3 mL Intravenous PRN Opyd, Ilene Qua, MD       Labs: Basic Metabolic Panel: Recent Labs  Lab 01/22/18 1817  NA 141  K 4.6  CL 99  CO2 22  GLUCOSE 101*  BUN 67*  CREATININE 15.58*  CALCIUM 7.8*   Liver Function Tests: Recent Labs  Lab 01/22/18 1817  AST 17  ALT 11  ALKPHOS 43  BILITOT  0.6  PROT 5.7*  ALBUMIN 2.3*   No results for input(s): LIPASE, AMYLASE in the last 168 hours. No results for input(s): AMMONIA in the last 168 hours. CBC: Recent Labs  Lab 01/22/18 1817  WBC 10.4  HGB 5.5*  HCT 18.0*  MCV 102.9*  PLT 326   Cardiac Enzymes: No results for input(s): CKTOTAL, CKMB, CKMBINDEX, TROPONINI in the last 168 hours. CBG: No results for input(s): GLUCAP in the last 168 hours. Iron Studies:  Recent Labs    01/22/18 2226  IRON 52  TIBC 204*  FERRITIN 792*   Studies/Results: No results found.  ROS: As per HPI otherwise negative.   Physical Exam: Vitals:   01/23/18 0300 01/23/18 0335 01/23/18 0430 01/23/18 0552  BP: 131/66 (!) 133/56 (!) 141/70 (!) 149/71  Pulse: 81 79 77 76  Resp: 18 18 18 20   Temp: 98.6 F (37 C) 98.8 F (37.1 C) 98.4 F (36.9 C) 98.2 F (36.8 C)  TempSrc: Oral Oral Oral Oral  SpO2: 100% 97% 94% 99%  Weight:      Height:         General: Well developed, well nourished, in no acute distress. Head: Normocephalic, atraumatic, sclera non-icteric, mucus membranes are moist Neck: Supple. JVD not elevated. Lungs: Clear bilaterally to auscultation without wheezes, rales, or rhonchi. Breathing is unlabored. Heart: RRR with S1 S2. No murmurs, rubs, or gallops appreciated. Abdomen: Soft, non-tender, non-distended with normoactive bowel sounds. No rebound/guarding. No obvious abdominal masses. M-S:  Strength and tone appear normal for age. Lower extremities:BLE 1+ pitting edema. Has healing wound R shin from fall at home.  Neuro: Alert and oriented X 3. Moves all extremities spontaneously. Psych:  Responds to questions appropriately with a normal affect. Dialysis Access: RUA AVF +Bruit RLQ PD catheter, drsg CDI.   Dialysis Orders: 7X Week, EDW 51 (kg) Ca 2.5 (mEq/L) Mg 0.5 (mEq/L) Dextrose 1.5; 2.5; 4.25 %, # Exchanges 6, fill vol  2000 mL, Dwell Time 1 hrs 30 min, last fill vol 1000 mL, Day Exchanges 0, Day Dwell Time 3 hrs 0 min   -Heparin 500 units 1 bag q treatment   Assessment/Plan: 1.  Symptomatic Anemia: HGB 5.5 rec'd 2 units PRBCs. CBC pending. FOBT negative. Fe 52 Tsat 25%. Give Feraheme 510 mg IV today.   2.  ESRD - On CCPD. Resume previous orders-K+ 4.6. Has BLE edema. Will use 4.25% for next 24 hours then resume 2.5%.  3.  Hypertension/volume  - BP well controlled, has BLE pitting edema. Use 4.25% next 24 hours and attempt to lower volume. Wt 56.7 kg markedly above OP EDW. Check standing wt. On amlodipine and coreg for BP control.  4.  Anemia  - as noted above 5.  Metabolic bone disease - Continue binders, sensipar 6.  Nutrition - Albumin 2.3. Clear liquid diet at present. Regular diet with prostat, renal vit when able to eat.  7. H/O CVA 8. H/O seizures-on keppra  Rita H. Owens Shark, NP-C 01/23/2018, 8:37 AM  [facility] [phone]   Renal Attending: She has profound anemia of uncertain cause. She reports no Micera for several months. We need to check on this. She also needs further evaluation for a renal transplant. Erling Cruz, MD

## 2018-01-23 NOTE — Progress Notes (Signed)
New Admission Note:  Arrival Method: Stretcher Mental Orientation: Alert and oriented x 4 Telemetry: Box 22 Assessment: Completed Skin: Warm and dry. Scab to Rt shin  IV: NSL Pain: Denies  Tubes: PD Cath  Safety Measures: Safety Fall Prevention Plan initiated.  Admission: Completed 5 M  Orientation: Patient has been orientated to the room, unit and the staff. Family: None  Orders have been reviewed and implemented. Will continue to monitor the patient. Call light has been placed within reach and bed alarm has been activated.   Sima Matas BSN, RN  Phone Number: [phone]

## 2018-01-24 ENCOUNTER — Encounter (HOSPITAL_COMMUNITY): Payer: Self-pay

## 2018-01-24 ENCOUNTER — Ambulatory Visit: Payer: Self-pay | Admitting: Adult Health

## 2018-01-24 DIAGNOSIS — D631 Anemia in chronic kidney disease: Secondary | ICD-10-CM | POA: Diagnosis not present

## 2018-01-24 DIAGNOSIS — N2581 Secondary hyperparathyroidism of renal origin: Secondary | ICD-10-CM | POA: Diagnosis not present

## 2018-01-24 DIAGNOSIS — K659 Peritonitis, unspecified: Secondary | ICD-10-CM | POA: Diagnosis not present

## 2018-01-24 DIAGNOSIS — E440 Moderate protein-calorie malnutrition: Secondary | ICD-10-CM | POA: Diagnosis not present

## 2018-01-24 DIAGNOSIS — N186 End stage renal disease: Secondary | ICD-10-CM | POA: Diagnosis not present

## 2018-01-24 DIAGNOSIS — D509 Iron deficiency anemia, unspecified: Secondary | ICD-10-CM | POA: Diagnosis not present

## 2018-01-24 LAB — HEMOGLOBIN AND HEMATOCRIT, BLOOD
HCT: 24.1 % — ABNORMAL LOW (ref 36.0–46.0)
Hemoglobin: 7.9 g/dL — ABNORMAL LOW (ref 12.0–15.0)

## 2018-01-24 LAB — BPAM RBC
BLOOD PRODUCT EXPIRATION DATE: 201908122359
Blood Product Expiration Date: 201908122359
ISSUE DATE / TIME: 201907210309
ISSUE DATE / TIME: 201907202359
UNIT TYPE AND RH: 5100
Unit Type and Rh: 5100

## 2018-01-24 LAB — TYPE AND SCREEN
ABO/RH(D): O POS
ANTIBODY SCREEN: NEGATIVE
UNIT DIVISION: 0
Unit division: 0

## 2018-01-24 LAB — CBC
HCT: 29.2 % — ABNORMAL LOW (ref 36.0–46.0)
HEMOGLOBIN: 9.5 g/dL — AB (ref 12.0–15.0)
MCH: 30.9 pg (ref 26.0–34.0)
MCHC: 32.5 g/dL (ref 30.0–36.0)
MCV: 95.1 fL (ref 78.0–100.0)
Platelets: 337 10*3/uL (ref 150–400)
RBC: 3.07 MIL/uL — ABNORMAL LOW (ref 3.87–5.11)
RDW: 15.2 % (ref 11.5–15.5)
WBC: 11.5 10*3/uL — ABNORMAL HIGH (ref 4.0–10.5)

## 2018-01-24 LAB — BASIC METABOLIC PANEL
Anion gap: 18 — ABNORMAL HIGH (ref 5–15)
BUN: 64 mg/dL — AB (ref 6–20)
CHLORIDE: 96 mmol/L — AB (ref 98–111)
CO2: 24 mmol/L (ref 22–32)
CREATININE: 15.26 mg/dL — AB (ref 0.44–1.00)
Calcium: 8.7 mg/dL — ABNORMAL LOW (ref 8.9–10.3)
GFR calc Af Amer: 3 mL/min — ABNORMAL LOW (ref >60)
GFR calc non Af Amer: 2 mL/min — ABNORMAL LOW (ref >60)
GLUCOSE: 131 mg/dL — AB (ref 70–99)
Potassium: 3.7 mmol/L (ref 3.5–5.1)
SODIUM: 138 mmol/L (ref 135–145)

## 2018-01-24 MED ORDER — DARBEPOETIN ALFA 100 MCG/0.5ML IJ SOSY
100.0000 ug | PREFILLED_SYRINGE | Freq: Once | INTRAMUSCULAR | Status: AC
  Administered 2018-01-24: 100 ug via SUBCUTANEOUS
  Filled 2018-01-24: qty 0.5

## 2018-01-24 MED ORDER — HEPARIN 1000 UNIT/ML FOR PERITONEAL DIALYSIS
INTRAPERITONEAL | Status: DC | PRN
  Filled 2018-01-24: qty 5000

## 2018-01-24 MED ORDER — HEPARIN 1000 UNIT/ML FOR PERITONEAL DIALYSIS: 500.0000 [IU] | INTRAMUSCULAR | Status: DC | PRN

## 2018-01-24 MED ORDER — GENTAMICIN SULFATE 0.1 % EX CREA
1.0000 "application " | TOPICAL_CREAM | Freq: Every day | CUTANEOUS | Status: DC
  Filled 2018-01-24: qty 15

## 2018-01-24 MED ORDER — HEPARIN SODIUM (PORCINE) 5000 UNIT/ML IJ SOLN
5000.0000 [IU] | Freq: Three times a day (TID) | INTRAMUSCULAR | Status: DC
  Administered 2018-01-24 – 2018-01-25 (×3): 5000 [IU] via SUBCUTANEOUS
  Filled 2018-01-24 (×3): qty 1

## 2018-01-24 MED ORDER — DELFLEX-LC/2.5% DEXTROSE 394 MOSM/L IP SOLN
INTRAPERITONEAL | Status: DC
  Administered 2018-01-24: 10000 mL via INTRAPERITONEAL

## 2018-01-24 NOTE — Progress Notes (Addendum)
[facility] Progress Note   Subjective: Feeling better.  No problem with PD overnight, on last cycle now.  Hoping to go home.  Objective Vitals:   01/24/18 0539 01/24/18 0937 01/24/18 1314 01/24/18 1319  BP: (!) 165/78 (!) 146/75  134/66  Pulse: 86 84  81  Resp: 16 17  17   Temp: 99.5 F (37.5 C) 97.8 F (36.6 C)  98.3 F (36.8 C)  TempSrc: Oral Oral Oral   SpO2: 90% 92%    Weight:      Height:       Physical Exam General:NAD, chronically ill appearing female Heart:RRR, no MRG Lungs:CTAB Abdomen:NT, mildly distended Extremities:no LE edema Dialysis Access: RLQ PD cath  [facility] Xxx Weights   01/22/18 1802 01/23/18 1947  Weight: 56.7 kg (125 lb) 64.4 kg (141 lb 15.6 oz)    Intake/Output Summary (Last 24 hours) at 01/24/2018 1330 Last data filed at 01/24/2018 0915 Gross per 24 hour  Intake 843 ml  Output 0 ml  Net 843 ml    Additional Objective Labs: Basic Metabolic Panel: Recent Labs  Lab 01/22/18 1817 01/23/18 0823 01/24/18 0821  NA 141 140 138  K 4.6 4.3 3.7  CL 99 98 96*  CO2 22 23 24   GLUCOSE 101* 87 131*  BUN 67* 72* 64*  CREATININE 15.58* 16.17* 15.26*  CALCIUM 7.8* 7.6* 8.7*   Liver Function Tests: Recent Labs  Lab 01/22/18 1817  AST 17  ALT 11  ALKPHOS 43  BILITOT 0.6  PROT 5.7*  ALBUMIN 2.3*   CBC: Recent Labs  Lab 01/22/18 1817 01/23/18 0823 01/24/18 0821  WBC 10.4  --  11.5*  HGB 5.5* 7.9* 9.5*  HCT 18.0* 24.1* 29.2*  MCV 102.9*  --  95.1  PLT 326  --  337    Iron Studies:  Recent Labs    01/22/18 2226  IRON 52  TIBC 204*  FERRITIN 792*   Lab Results  Component Value Date   INR 1.13 01/22/2018   INR 1.09 10/18/2017   INR 0.93 12/21/2016   Studies/Results: No results found.  Medications: . sodium chloride 250 mL (01/23/18 1040)   . amLODipine  10 mg Oral Daily  . calcium acetate  1,334 mg Oral TID WC  . carvedilol  25 mg Oral BID WC  . cinacalcet  60 mg Oral Q M,W,F  . gabapentin  100 mg Oral QHS   . gentamicin cream  1 application Topical Daily  . dianeal solution for CAPD/CCPD with heparin   Peritoneal Dialysis 5 X Daily  . levETIRAcetam  500 mg Oral BID  . levothyroxine  112 mcg Oral QAC breakfast  . multivitamin  1 tablet Oral QHS  . pantoprazole  40 mg Oral BID  . sodium chloride flush  3 mL Intravenous Q12H  . sodium chloride flush  3 mL Intravenous Q12H    Dialysis Orders: 7X Week, EDW 51 (kg) Ca 2.5 (mEq/L) Mg 0.5 (mEq/L) Dextrose 1.5; 2.5; 4.25 %, # Exchanges 6, fill vol 2000 mL, Dwell Time 1 hrs 30 min, last fill vol 1000 mL, Day Exchanges 0, Day Dwell Time 3 hrs 0 min  -Heparin 500 units 1 bag q treatment   Assessment/Plan: 1. Symptomatic anemia - Hgb 5.5>9.5 s/p 2Units pRBC on 7/21.  FOBT negative. Given 510 Feraheme w/HD yesterday.  2. ESRD - on CCPD.  Used 4.25% bag overnight, tolerated well.   Continue previous orders while admitted.   3. Secondary hyperparathyroidism - Continue binders VDRA. 4.HTN/volume -  BP well controlled.  Edema improved.  Need standing weights to better assess EDW.  5. Nutrition - Alb 2.3. Renal diet with fluid restrictions once advanced. Prostat. Renavite.  6. Hx CVA 7. Hx seizures  8. Dispo - Ok to d/c from renal standpoint  Jen Mow, PA-C [facility] Pager: [phone] 01/24/2018,1:30 PM  LOS: 2 days   Pt seen, examined and agree w A/P as above.  WE have had some issues w/ culture negative peritonitis over the last several months, let's keep patient overnight and get a good inpatient culture before pt discharge.  Will plan PD overnight and send specimen coming off the machine in the morning, then should be ready for dc.  The anemia is likely ESA related and should respond to re-initiation, will plan give one dose of darbepoetin here overnight p/t discharge.  [name] [employer] pager [phone]   01/24/2018, 2:48 PM

## 2018-01-24 NOTE — Progress Notes (Addendum)
PROGRESS NOTE    Terry Juarez  GYI:[id number] DOB: 01/24/1966 DOA: 01/22/2018 PCP: [name], MD    Brief Narrative:   Terry Juarez is a 52 y.o. female with medical history significant for end-stage renal disease on peritoneal dialysis, history of CVA with left-sided motor deficits, hypothyroidism, hypertension, and history of GI bleed, now presenting to the emergency department for evaluation of fatigue and low hemoglobin on outpatient blood work.   Upon arrival to the ED, patient is found to be afebrile, saturating well on room air, and with vitals otherwise normal.  Chemistry panel features a normal potassium and bicarbonate and BUN of 67, slightly higher than priors.  CBC is notable for hemoglobin of 5.5, down from 10.2 in April.  INR is normal and fecal occult blood testing is negative.  Anemia panel was sent by the ED provider and type and screen was performed.  Patient remains hemodynamically stable, in no apparent respiratory distress, and will be admitted for ongoing evaluation and management of symptomatic anemia.    Assessment & Plan:   Principal Problem:   Symptomatic anemia Active Problems:   ESRD on peritoneal dialysis ([place])   Seizure disorder (HCC)   Hypothyroidism   Hypertension secondary to other renal disorders   History of completed stroke   Symptomatic anemia:  Admitted for blood transfusions.  She underwent 2 units of prbc transfusion and repeat H&H is stable.  She denies any rectal bleeding or malena or hematuria.  She reports undergoing  colonoscopy and EGD last year.  Egd was wnl. Colonoscopy showed diverticulosis.  Anemia panel shows adequate iron and ferritin.  IV iron and darbepoetin prior to discharge.    ESRD on PD; Further management as per nephrology. Plan for PD tonight and send the fluid for culture in am. And possible d/c in am.    Seizure disorder; Resume home medications. Resume keppra.    Hypertension:  Well controlled.  No change in meds.    Hypothyroidism: Resume synthroid.    H/o CVA with left residual deficits.  Stable.       DVT prophylaxis: sq heparin.  Code Status:  Full code.  Family Communication: none at bedside.  Disposition Plan: possible d/c in am.    Consultants:  Nephrology.    Procedures: PD   Antimicrobials: none.    Subjective: No new complaints. Wants to know when she can go home.   Objective: Vitals:   01/24/18 1314 01/24/18 1319 01/24/18 1352 01/24/18 1505  BP:  134/66  (!) 155/73  Pulse:  81  81  Resp:  17  15  Temp:  98.3 F (36.8 C)  99.1 F (37.3 C)  TempSrc: Oral   Oral  SpO2:    94%  Weight:   63.6 kg (140 lb 3.4 oz)   Height:        Intake/Output Summary (Last 24 hours) at 01/24/2018 1535 Last data filed at 01/24/2018 1358 Gross per 24 hour  Intake 963 ml  Output 0 ml  Net 963 ml   Filed Weights   01/22/18 1802 01/23/18 1947 01/24/18 1352  Weight: 56.7 kg (125 lb) 64.4 kg (141 lb 15.6 oz) 63.6 kg (140 lb 3.4 oz)    Examination:  General exam: Appears calm and comfortable not in distress.  Respiratory system: Clear to auscultation. Respiratory effort normal.no wheezing or rhonchi.  Cardiovascular system: S1 & S2 heard, RRR. Gastrointestinal system: Abdomen is soft NT ND BS+ Central nervous system: Alert and oriented. No focal  neurological deficits. Extremities: Symmetric 5 x 5 power. Skin: No rashes, lesions or ulcers Psychiatry:  Mood & affect appropriate.     Data Reviewed: I have personally reviewed following labs and imaging studies  CBC: Recent Labs  Lab 01/22/18 1817 01/23/18 0823 01/24/18 0821  WBC 10.4  --  11.5*  HGB 5.5* 7.9* 9.5*  HCT 18.0* 24.1* 29.2*  MCV 102.9*  --  95.1  PLT 326  --  782   Basic Metabolic Panel: Recent Labs  Lab 01/22/18 1817 01/23/18 0823 01/24/18 0821  NA 141 140 138  K 4.6 4.3 3.7  CL 99 98 96*  CO2 22 23 24   GLUCOSE 101* 87 131*  BUN 67* 72* 64*  CREATININE 15.58* 16.17*  15.26*  CALCIUM 7.8* 7.6* 8.7*   GFR: Estimated Creatinine Clearance: 3.9 mL/min (A) (by C-G formula based on SCr of 15.26 mg/dL (H)). Liver Function Tests: Recent Labs  Lab 01/22/18 1817  AST 17  ALT 11  ALKPHOS 43  BILITOT 0.6  PROT 5.7*  ALBUMIN 2.3*   No results for input(s): LIPASE, AMYLASE in the last 168 hours. No results for input(s): AMMONIA in the last 168 hours. Coagulation Profile: Recent Labs  Lab 01/22/18 1817  INR 1.13   Cardiac Enzymes: No results for input(s): CKTOTAL, CKMB, CKMBINDEX, TROPONINI in the last 168 hours. BNP (last 3 results) No results for input(s): PROBNP in the last 8760 hours. HbA1C: No results for input(s): HGBA1C in the last 72 hours. CBG: No results for input(s): GLUCAP in the last 168 hours. Lipid Profile: No results for input(s): CHOL, HDL, LDLCALC, TRIG, CHOLHDL, LDLDIRECT in the last 72 hours. Thyroid Function Tests: No results for input(s): TSH, T4TOTAL, FREET4, T3FREE, THYROIDAB in the last 72 hours. Anemia Panel: Recent Labs    01/22/18 2226  VITAMINB12 935*  FOLATE 7.3  FERRITIN 792*  TIBC 204*  IRON 52  RETICCTPCT 3.2*   Sepsis Labs: No results for input(s): PROCALCITON, LATICACIDVEN in the last 168 hours.  No results found for this or any previous visit (from the past 240 hour(s)).       Radiology Studies: No results found.      Scheduled Meds: . amLODipine  10 mg Oral Daily  . calcium acetate  1,334 mg Oral TID WC  . carvedilol  25 mg Oral BID WC  . cinacalcet  60 mg Oral Q M,W,F  . darbepoetin (ARANESP) injection - NON-DIALYSIS  100 mcg Subcutaneous Once  . gabapentin  100 mg Oral QHS  . gentamicin cream  1 application Topical Daily  . dianeal solution for CAPD/CCPD with heparin   Peritoneal Dialysis 5 X Daily  . heparin injection (subcutaneous)  5,000 Units Subcutaneous Q8H  . levETIRAcetam  500 mg Oral BID  . levothyroxine  112 mcg Oral QAC breakfast  . multivitamin  1 tablet Oral QHS  .  pantoprazole  40 mg Oral BID  . sodium chloride flush  3 mL Intravenous Q12H  . sodium chloride flush  3 mL Intravenous Q12H   Continuous Infusions: . sodium chloride 250 mL (01/23/18 1040)  . dialysis solution 2.5% low-MG/low-CA       LOS: 2 days    Time spent: 25 minutes.     Hosie Poisson, MD [facility] Pager [phone]  If 7PM-7AM, please contact night-coverage [email or web address] Password [email or web address] 01/24/2018, 3:35 PM

## 2018-01-25 DIAGNOSIS — K659 Peritonitis, unspecified: Secondary | ICD-10-CM | POA: Diagnosis not present

## 2018-01-25 DIAGNOSIS — E440 Moderate protein-calorie malnutrition: Secondary | ICD-10-CM | POA: Diagnosis not present

## 2018-01-25 DIAGNOSIS — N2581 Secondary hyperparathyroidism of renal origin: Secondary | ICD-10-CM | POA: Diagnosis not present

## 2018-01-25 DIAGNOSIS — D631 Anemia in chronic kidney disease: Secondary | ICD-10-CM | POA: Diagnosis not present

## 2018-01-25 DIAGNOSIS — N186 End stage renal disease: Secondary | ICD-10-CM | POA: Diagnosis not present

## 2018-01-25 DIAGNOSIS — D509 Iron deficiency anemia, unspecified: Secondary | ICD-10-CM | POA: Diagnosis not present

## 2018-01-25 LAB — GRAM STAIN

## 2018-01-25 LAB — BODY FLUID CELL COUNT WITH DIFFERENTIAL
EOS FL: 3 %
LYMPHS FL: 31 %
MONOCYTE-MACROPHAGE-SEROUS FLUID: 16 % — AB (ref 50–90)
Neutrophil Count, Fluid: 50 % — ABNORMAL HIGH (ref 0–25)
Total Nucleated Cell Count, Fluid: 260 cu mm (ref 0–1000)

## 2018-01-25 NOTE — Care Management Note (Signed)
Case Management Note  Patient Details  Name: Terry Juarez MRN: [id number] Date of Birth: Jun 20, 1966  Subjective/Objective:          Symptomatic anemia, hx of end-stage renal disease on peritoneal dialysis, CVA with left-sided motor deficits, hypothyroidism, hypertension, and GI bleed. Resides with father.Pt states independent with ADL's.   Renato Gails (Father) Timmothy Euler [facility] [phone]     PCP: Domenick Gong  Action/Plan: Transition to home today. Pt states has transportation to home.  Expected Discharge Date:  01/25/18               Expected Discharge Plan:  Home/Self Care  In-House Referral:     Discharge planning Services      Status of Service:  Completed, signed off  If discussed at [place] of Stay Meetings, dates discussed:    Additional Comments:  Sharin Mons, RN 01/25/2018, 2:01 PM

## 2018-01-25 NOTE — Care Management Important Message (Signed)
Important Message  Patient Details  Name: Terry Juarez MRN: [id number] Date of Birth: March 04, 1966   Medicare Important Message Given:  Yes    Orbie Pyo 01/25/2018, 2:38 PM

## 2018-01-25 NOTE — Progress Notes (Addendum)
[facility] Progress Note   Subjective:   Doing well.  No new complaints.  Objective Vitals:   01/25/18 0548 01/25/18 0750 01/25/18 0802 01/25/18 0822  BP: (!) 150/74  (!) 159/77   Pulse: 79  80   Resp: 12  13   Temp: 98.8 F (37.1 C)  98.3 F (36.8 C)   TempSrc: Oral Oral    SpO2: 93%  91%   Weight:    60.6 kg (133 lb 9.6 oz)  Height:       Physical Exam General:NAD, chronically ill appearing female Heart:RRR, +6/9 systolic murmur Lungs:CTAB Abdomen:soft, NTND Extremities:no LE edema Dialysis Access: RLQ PD cath   [facility] Weights   01/24/18 1352 01/24/18 1820 01/25/18 0822  Weight: 63.6 kg (140 lb 3.4 oz) 63.5 kg (139 lb 15.9 oz) 60.6 kg (133 lb 9.6 oz)    Intake/Output Summary (Last 24 hours) at 01/25/2018 1417 Last data filed at 01/25/2018 1300 Gross per 24 hour  Intake 540 ml  Output 0 ml  Net 540 ml    Additional Objective Labs: Basic Metabolic Panel: Recent Labs  Lab 01/22/18 1817 01/23/18 0823 01/24/18 0821  NA 141 140 138  K 4.6 4.3 3.7  CL 99 98 96*  CO2 22 23 24   GLUCOSE 101* 87 131*  BUN 67* 72* 64*  CREATININE 15.58* 16.17* 15.26*  CALCIUM 7.8* 7.6* 8.7*   Liver Function Tests: Recent Labs  Lab 01/22/18 1817  AST 17  ALT 11  ALKPHOS 43  BILITOT 0.6  PROT 5.7*  ALBUMIN 2.3*   CBC: Recent Labs  Lab 01/22/18 1817 01/23/18 0823 01/24/18 0821  WBC 10.4  --  11.5*  HGB 5.5* 7.9* 9.5*  HCT 18.0* 24.1* 29.2*  MCV 102.9*  --  95.1  PLT 326  --  337   Iron Studies:  Recent Labs    01/22/18 2226  IRON 52  TIBC 204*  FERRITIN 792*   Lab Results  Component Value Date   INR 1.13 01/22/2018   INR 1.09 10/18/2017   INR 0.93 12/21/2016   Studies/Results: No results found.  Medications: . sodium chloride 250 mL (01/23/18 1040)  . dialysis solution 2.5% low-MG/low-CA     . amLODipine  10 mg Oral Daily  . calcium acetate  1,334 mg Oral TID WC  . carvedilol  25 mg Oral BID WC  . cinacalcet  60 mg Oral Q M,W,F   . gabapentin  100 mg Oral QHS  . gentamicin cream  1 application Topical Daily  . dianeal solution for CAPD/CCPD with heparin   Peritoneal Dialysis 5 X Daily  . heparin injection (subcutaneous)  5,000 Units Subcutaneous Q8H  . levETIRAcetam  500 mg Oral BID  . levothyroxine  112 mcg Oral QAC breakfast  . multivitamin  1 tablet Oral QHS  . pantoprazole  40 mg Oral BID  . sodium chloride flush  3 mL Intravenous Q12H  . sodium chloride flush  3 mL Intravenous Q12H    Dialysis Orders: 7X Week, EDW 51 (kg) Ca 2.5 (mEq/L) Mg 0.5 (mEq/L) Dextrose 1.5; 2.5; 4.25 %, # Exchanges 6, fill vol 2000 mL, Dwell Time 1 hrs 30 min, last fill vol 1000 mL, Day Exchanges 0, Day Dwell Time 3 hrs 0 min -Heparin 500 units 1 bag q treatment   Assessment/Plan: 1. Symptomatic anemia - suspected to be ESA related, Aranesp restarted on 7/22. Hgb 5.5>9.5 s/p 2Units pRBC on 7/21.  FOBT negative. Given 510 Feraheme w/dialysis on 7/21.  2. ESRD on CCPD.  Continue previous orders while admitted.  Peritoneal culture collected this AM due to Hx culture negative peritonitis over last several months.   3. Secondary hyperparathyroidism - Ca 8.7. Continue binders and VDRA. 4.HTN/volume - BP elevated today.  Edema improved.  If weights correct significantly above EDW. Need to ensure we are getting standing weights to better assess.  5. Nutrition - Alb 2.3. Renal diet with fluid restrictions once advanced. Prostat. Renavite.  6. Hx CVA 7. Hx seizures  8. Dispo - Ok to d/c from renal standpoint     Jen Mow, PA-C [facility] Pager: [phone] 01/25/2018,2:17 PM  LOS: 3 days   Pt seen, examined and agree w A/P as above.  [name] [employer] pager [phone]   01/25/2018, 2:58 PM

## 2018-01-26 DIAGNOSIS — E440 Moderate protein-calorie malnutrition: Secondary | ICD-10-CM | POA: Diagnosis not present

## 2018-01-26 DIAGNOSIS — N186 End stage renal disease: Secondary | ICD-10-CM | POA: Diagnosis not present

## 2018-01-26 DIAGNOSIS — D631 Anemia in chronic kidney disease: Secondary | ICD-10-CM | POA: Diagnosis not present

## 2018-01-26 DIAGNOSIS — D509 Iron deficiency anemia, unspecified: Secondary | ICD-10-CM | POA: Diagnosis not present

## 2018-01-26 DIAGNOSIS — D508 Other iron deficiency anemias: Secondary | ICD-10-CM | POA: Diagnosis not present

## 2018-01-26 DIAGNOSIS — N041 Nephrotic syndrome with focal and segmental glomerular lesions: Secondary | ICD-10-CM | POA: Diagnosis not present

## 2018-01-26 DIAGNOSIS — Z6823 Body mass index (BMI) 23.0-23.9, adult: Secondary | ICD-10-CM | POA: Diagnosis not present

## 2018-01-26 DIAGNOSIS — N2581 Secondary hyperparathyroidism of renal origin: Secondary | ICD-10-CM | POA: Diagnosis not present

## 2018-01-26 DIAGNOSIS — Z992 Dependence on renal dialysis: Secondary | ICD-10-CM | POA: Diagnosis not present

## 2018-01-26 DIAGNOSIS — K659 Peritonitis, unspecified: Secondary | ICD-10-CM | POA: Diagnosis not present

## 2018-01-26 LAB — PATHOLOGIST SMEAR REVIEW

## 2018-01-27 DIAGNOSIS — N186 End stage renal disease: Secondary | ICD-10-CM | POA: Diagnosis not present

## 2018-01-27 DIAGNOSIS — E440 Moderate protein-calorie malnutrition: Secondary | ICD-10-CM | POA: Diagnosis not present

## 2018-01-27 DIAGNOSIS — Z992 Dependence on renal dialysis: Secondary | ICD-10-CM | POA: Diagnosis not present

## 2018-01-27 DIAGNOSIS — D509 Iron deficiency anemia, unspecified: Secondary | ICD-10-CM | POA: Diagnosis not present

## 2018-01-27 DIAGNOSIS — N2581 Secondary hyperparathyroidism of renal origin: Secondary | ICD-10-CM | POA: Diagnosis not present

## 2018-01-27 DIAGNOSIS — D508 Other iron deficiency anemias: Secondary | ICD-10-CM | POA: Diagnosis not present

## 2018-01-27 DIAGNOSIS — Z6823 Body mass index (BMI) 23.0-23.9, adult: Secondary | ICD-10-CM | POA: Diagnosis not present

## 2018-01-27 DIAGNOSIS — D631 Anemia in chronic kidney disease: Secondary | ICD-10-CM | POA: Diagnosis not present

## 2018-01-27 DIAGNOSIS — K659 Peritonitis, unspecified: Secondary | ICD-10-CM | POA: Diagnosis not present

## 2018-01-28 DIAGNOSIS — D509 Iron deficiency anemia, unspecified: Secondary | ICD-10-CM | POA: Diagnosis not present

## 2018-01-28 DIAGNOSIS — N2581 Secondary hyperparathyroidism of renal origin: Secondary | ICD-10-CM | POA: Diagnosis not present

## 2018-01-28 DIAGNOSIS — D631 Anemia in chronic kidney disease: Secondary | ICD-10-CM | POA: Diagnosis not present

## 2018-01-28 DIAGNOSIS — K659 Peritonitis, unspecified: Secondary | ICD-10-CM | POA: Diagnosis not present

## 2018-01-28 DIAGNOSIS — E440 Moderate protein-calorie malnutrition: Secondary | ICD-10-CM | POA: Diagnosis not present

## 2018-01-28 DIAGNOSIS — N186 End stage renal disease: Secondary | ICD-10-CM | POA: Diagnosis not present

## 2018-01-29 DIAGNOSIS — K659 Peritonitis, unspecified: Secondary | ICD-10-CM | POA: Diagnosis not present

## 2018-01-29 DIAGNOSIS — N186 End stage renal disease: Secondary | ICD-10-CM | POA: Diagnosis not present

## 2018-01-29 DIAGNOSIS — D509 Iron deficiency anemia, unspecified: Secondary | ICD-10-CM | POA: Diagnosis not present

## 2018-01-29 DIAGNOSIS — D631 Anemia in chronic kidney disease: Secondary | ICD-10-CM | POA: Diagnosis not present

## 2018-01-29 DIAGNOSIS — N2581 Secondary hyperparathyroidism of renal origin: Secondary | ICD-10-CM | POA: Diagnosis not present

## 2018-01-29 DIAGNOSIS — E440 Moderate protein-calorie malnutrition: Secondary | ICD-10-CM | POA: Diagnosis not present

## 2018-01-29 LAB — CULTURE, BODY FLUID W GRAM STAIN -BOTTLE

## 2018-01-29 LAB — CULTURE, BODY FLUID-BOTTLE

## 2018-01-30 DIAGNOSIS — K659 Peritonitis, unspecified: Secondary | ICD-10-CM | POA: Diagnosis not present

## 2018-01-30 DIAGNOSIS — E440 Moderate protein-calorie malnutrition: Secondary | ICD-10-CM | POA: Diagnosis not present

## 2018-01-30 DIAGNOSIS — D631 Anemia in chronic kidney disease: Secondary | ICD-10-CM | POA: Diagnosis not present

## 2018-01-30 DIAGNOSIS — N2581 Secondary hyperparathyroidism of renal origin: Secondary | ICD-10-CM | POA: Diagnosis not present

## 2018-01-30 DIAGNOSIS — D509 Iron deficiency anemia, unspecified: Secondary | ICD-10-CM | POA: Diagnosis not present

## 2018-01-30 DIAGNOSIS — N186 End stage renal disease: Secondary | ICD-10-CM | POA: Diagnosis not present

## 2018-01-30 NOTE — Discharge Summary (Signed)
Physician Discharge Summary  Terry Juarez QIW:[id number] DOB: [birth year] DOA: 01/22/2018  PCP: [name], MD  Admit date: 01/22/2018 Discharge date: 01/25/2018  Admitted From: Home.  Disposition:  Home.   Recommendations for Outpatient Follow-up:  1. Follow up with PCP in 1-2 weeks 2. Please obtain BMP/CBC in one week Please follow up with nephrology regarding the peritoneal fluid cultures.   Discharge Condition:stable.  CODE STATUS: full code.  Diet recommendation: Heart Healthy   Brief/Interim Summary: Terry Juarez a 52 y.o.femalewith medical history significant forend-stage renal disease on peritoneal dialysis, history of CVA with left-sided motor deficits, hypothyroidism, hypertension, and history of GI bleed, now presenting to the emergency department for evaluation of fatigue and low hemoglobin on outpatient blood work.   Upon arrival to the ED, patient is found to be afebrile, saturating well on room air, and with vitals otherwise normal. Chemistry panel features a normal potassium and bicarbonate and BUN of 67, slightly higher than priors. CBC is notable for hemoglobin of 5.5, down from 10.2 in April. INR is normal and fecal occult blood testing is negative. Anemia panel was sent by the ED provider and type and screen was performed. Patient remains hemodynamically stable, in no apparent respiratory distress, and will be admitted for ongoing evaluation and management of symptomatic anemia    Discharge Diagnoses:  Principal Problem:   Symptomatic anemia Active Problems:   ESRD on peritoneal dialysis ([place])   Seizure disorder ([place])   Hypothyroidism   Hypertension secondary to other renal disorders   History of completed stroke  Symptomatic anemia:  Admitted for blood transfusions.  She underwent 2 units of prbc transfusion and repeat H&H is stable.  She denies any rectal bleeding or malena or hematuria.  She reports undergoing  colonoscopy and EGD  last year.  Egd was wnl. Colonoscopy showed diverticulosis.  Anemia panel shows adequate iron and ferritin.  IV iron and darbepoetin prior to discharge.    ESRD on PD; Further management as per nephrology. Plan for PD tonight and sent the fluid for culture. Please follow up with PCP and nephrology regarding the peritoneal fluid culture.  Seizure disorder; Resume home medications. Resume keppra.    Hypertension:  Well controlled. No change in meds.    Hypothyroidism: Resume synthroid.    H/o CVA with left residual deficits.  Stable.      Discharge Instructions  Discharge Instructions    Diet - low sodium heart healthy   Complete by:  As directed    Discharge instructions   Complete by:  As directed    Please follow up with Nephrology as recommended, please follow up the cultures reports.     Allergies as of 01/25/2018      Reactions   Lisinopril Cough      Medication List    TAKE these medications   acetaminophen 500 MG tablet Commonly known as:  TYLENOL Take 1,000 mg by mouth every 6 (six) hours as needed for moderate pain or headache.   amLODipine 10 MG tablet Commonly known as:  NORVASC Take 1 tablet (10 mg total) by mouth daily.   aspirin EC 81 MG tablet Take 1 tablet (81 mg total) by mouth daily.   calcium acetate 667 MG capsule Commonly known as:  PHOSLO Take 2 capsules (1,334 mg total) by mouth 3 (three) times daily with meals. What changed:    when to take this  additional instructions   carvedilol 25 MG tablet Commonly known as:  COREG Take  25 mg by mouth 2 (two) times daily.   cinacalcet 60 MG tablet Commonly known as:  SENSIPAR Take 60 mg by mouth every Monday, Wednesday, and Friday.   gabapentin 100 MG capsule Commonly known as:  NEURONTIN Take 100 mg by mouth at bedtime.   gentamicin cream 0.1 % Commonly known as:  GARAMYCIN Apply 1 application topically daily.   heparin 1000 UNIT/ML injection Inject 5,000 Units  into the vein as needed (fiber in her catheter). In 5L of fluid   levETIRAcetam 500 MG tablet Commonly known as:  KEPPRA Take 1 tablet (500 mg total) by mouth 2 (two) times daily.   levothyroxine 112 MCG tablet Commonly known as:  SYNTHROID, LEVOTHROID Take 112 mcg by mouth daily.   multivitamin Tabs tablet Take 1 tablet by mouth daily.   pantoprazole 40 MG tablet Commonly known as:  PROTONIX Take 1 tablet (40 mg total) by mouth 2 (two) times daily.      Follow-up Information    Tisovec, Fransico Him, MD. Schedule an appointment as soon as possible for a visit in 1 week.   Specialty:  Internal Medicine Why:  [facility] Patient Public librarian) will call you at phone [phone] to schedule a follow up appointment with you. Thank you.  Contact information: [address] [place] [address] [phone]          Allergies  Allergen Reactions  . Lisinopril Cough    Consultations:  NEPHROLOGY.    Procedures/Studies:  No results found. HD   Subjective:   Discharge Exam: Vitals:   01/25/18 0548 01/25/18 0802  BP: (!) 150/74 (!) 159/77  Pulse: 79 80  Resp: 12 13  Temp: 98.8 F (37.1 C) 98.3 F (36.8 C)  SpO2: 93% 91%   Vitals:   01/25/18 0548 01/25/18 0750 01/25/18 0802 01/25/18 0822  BP: (!) 150/74  (!) 159/77   Pulse: 79  80   Resp: 12  13   Temp: 98.8 F (37.1 C)  98.3 F (36.8 C)   TempSrc: Oral Oral    SpO2: 93%  91%   Weight:    60.6 kg (133 lb 9.6 oz)  Height:        General: Pt is alert, awake, not in acute distress Cardiovascular: RRR, S1/S2 +, no rubs, no gallops Respiratory: CTA bilaterally, no wheezing, no rhonchi Abdominal: Soft, NT, ND, bowel sounds + Extremities: no edema, no cyanosis    The results of significant diagnostics from this hospitalization (including imaging, microbiology, ancillary and laboratory) are listed below for reference.     Microbiology: Recent Results (from the past 240 hour(s))  Culture, body  fluid-bottle     Status: Abnormal   Collection Time: 01/25/18  8:48 AM  Result Value Ref Range Status   Specimen Description PERITONEAL DIALYSATE  Final   Special Requests NONE  Final   Gram Stain   Final    GRAM POSITIVE COCCI IN BOTH AEROBIC AND ANAEROBIC BOTTLES CRITICAL RESULT CALLED TO, READ BACK BY AND VERIFIED WITH: L PENNINGER  PA 01/26/18 AT 1106 BY CM Performed at [facility], [place] [address] [place], [place] [address]    Culture STAPHYLOCOCCUS EPIDERMIDIS (A)  Final   Report Status 01/29/2018 FINAL  Final   Organism ID, Bacteria STAPHYLOCOCCUS EPIDERMIDIS  Final      Susceptibility   Staphylococcus epidermidis - MIC*    CIPROFLOXACIN <=0.5 SENSITIVE Sensitive     ERYTHROMYCIN >=8 RESISTANT Resistant     GENTAMICIN <=0.5 SENSITIVE Sensitive  OXACILLIN >=4 RESISTANT Resistant     TETRACYCLINE 2 SENSITIVE Sensitive     VANCOMYCIN 1 SENSITIVE Sensitive     TRIMETH/SULFA 80 RESISTANT Resistant     CLINDAMYCIN <=0.25 SENSITIVE Sensitive     RIFAMPIN <=0.5 SENSITIVE Sensitive     Inducible Clindamycin NEGATIVE Sensitive     * STAPHYLOCOCCUS EPIDERMIDIS  Gram stain     Status: None   Collection Time: 01/25/18  8:48 AM  Result Value Ref Range Status   Specimen Description PERITONEAL DIALYSATE  Final   Special Requests NONE  Final   Gram Stain   Final    WBC PRESENT,BOTH PMN AND MONONUCLEAR NO ORGANISMS SEEN CYTOSPIN SMEAR Performed at [facility], [address] [place], [place] [address]    Report Status 01/25/2018 FINAL  Final     Labs: BNP (last 3 results) No results for input(s): BNP in the last 8760 hours. Basic Metabolic Panel: Recent Labs  Lab 01/24/18 0821  NA 138  K 3.7  CL 96*  CO2 24  GLUCOSE 131*  BUN 64*  CREATININE 15.26*  CALCIUM 8.7*   Liver Function Tests: No results for input(s): AST, ALT, ALKPHOS, BILITOT, PROT, ALBUMIN in the last 168 hours. No results for input(s): LIPASE, AMYLASE in the last 168 hours. No results for  input(s): AMMONIA in the last 168 hours. CBC: Recent Labs  Lab 01/24/18 0821  WBC 11.5*  HGB 9.5*  HCT 29.2*  MCV 95.1  PLT 337   Cardiac Enzymes: No results for input(s): CKTOTAL, CKMB, CKMBINDEX, TROPONINI in the last 168 hours. BNP: Invalid input(s): POCBNP CBG: No results for input(s): GLUCAP in the last 168 hours. D-Dimer No results for input(s): DDIMER in the last 72 hours. Hgb A1c No results for input(s): HGBA1C in the last 72 hours. Lipid Profile No results for input(s): CHOL, HDL, LDLCALC, TRIG, CHOLHDL, LDLDIRECT in the last 72 hours. Thyroid function studies No results for input(s): TSH, T4TOTAL, T3FREE, THYROIDAB in the last 72 hours.  Invalid input(s): FREET3 Anemia work up No results for input(s): VITAMINB12, FOLATE, FERRITIN, TIBC, IRON, RETICCTPCT in the last 72 hours. Urinalysis    Component Value Date/Time   COLORURINE YELLOW 01/09/2017 1736   APPEARANCEUR CLOUDY (A) 01/09/2017 1736   LABSPEC 1.016 01/09/2017 1736   PHURINE 5.0 01/09/2017 1736   GLUCOSEU NEGATIVE 01/09/2017 1736   HGBUR SMALL (A) 01/09/2017 1736   BILIRUBINUR NEGATIVE 01/09/2017 1736   KETONESUR NEGATIVE 01/09/2017 1736   PROTEINUR 100 (A) 01/09/2017 1736   UROBILINOGEN 0.2 05/15/2014 1717   NITRITE NEGATIVE 01/09/2017 1736   LEUKOCYTESUR MODERATE (A) 01/09/2017 1736   Sepsis Labs Invalid input(s): PROCALCITONIN,  WBC,  LACTICIDVEN Microbiology Recent Results (from the past 240 hour(s))  Culture, body fluid-bottle     Status: Abnormal   Collection Time: 01/25/18  8:48 AM  Result Value Ref Range Status   Specimen Description PERITONEAL DIALYSATE  Final   Special Requests NONE  Final   Gram Stain   Final    GRAM POSITIVE COCCI IN BOTH AEROBIC AND ANAEROBIC BOTTLES CRITICAL RESULT CALLED TO, READ BACK BY AND VERIFIED WITH: L PENNINGER  PA 01/26/18 AT 1106 BY CM Performed at [facility], [place] [address] [place], [place] [address]    Culture STAPHYLOCOCCUS EPIDERMIDIS (A)   Final   Report Status 01/29/2018 FINAL  Final   Organism ID, Bacteria STAPHYLOCOCCUS EPIDERMIDIS  Final      Susceptibility   Staphylococcus epidermidis - MIC*    CIPROFLOXACIN <=  0.5 SENSITIVE Sensitive     ERYTHROMYCIN >=8 RESISTANT Resistant     GENTAMICIN <=0.5 SENSITIVE Sensitive     OXACILLIN >=4 RESISTANT Resistant     TETRACYCLINE 2 SENSITIVE Sensitive     VANCOMYCIN 1 SENSITIVE Sensitive     TRIMETH/SULFA 80 RESISTANT Resistant     CLINDAMYCIN <=0.25 SENSITIVE Sensitive     RIFAMPIN <=0.5 SENSITIVE Sensitive     Inducible Clindamycin NEGATIVE Sensitive     * STAPHYLOCOCCUS EPIDERMIDIS  Gram stain     Status: None   Collection Time: 01/25/18  8:48 AM  Result Value Ref Range Status   Specimen Description PERITONEAL DIALYSATE  Final   Special Requests NONE  Final   Gram Stain   Final    WBC PRESENT,BOTH PMN AND MONONUCLEAR NO ORGANISMS SEEN CYTOSPIN SMEAR Performed at [facility], [place] [address], [place], [place] [address]    Report Status 01/25/2018 FINAL  Final     Time coordinating discharge: 32 minutes  SIGNED:   Hosie Poisson, MD  [facility] 01/30/2018, 10:49 PM Pager   If 7PM-7AM, please contact night-coverage [email or web address] Password [email or web address]

## 2018-01-31 DIAGNOSIS — E440 Moderate protein-calorie malnutrition: Secondary | ICD-10-CM | POA: Diagnosis not present

## 2018-01-31 DIAGNOSIS — D631 Anemia in chronic kidney disease: Secondary | ICD-10-CM | POA: Diagnosis not present

## 2018-01-31 DIAGNOSIS — N2581 Secondary hyperparathyroidism of renal origin: Secondary | ICD-10-CM | POA: Diagnosis not present

## 2018-01-31 DIAGNOSIS — K659 Peritonitis, unspecified: Secondary | ICD-10-CM | POA: Diagnosis not present

## 2018-01-31 DIAGNOSIS — D509 Iron deficiency anemia, unspecified: Secondary | ICD-10-CM | POA: Diagnosis not present

## 2018-01-31 DIAGNOSIS — N186 End stage renal disease: Secondary | ICD-10-CM | POA: Diagnosis not present

## 2018-02-01 DIAGNOSIS — K659 Peritonitis, unspecified: Secondary | ICD-10-CM | POA: Diagnosis not present

## 2018-02-01 DIAGNOSIS — E440 Moderate protein-calorie malnutrition: Secondary | ICD-10-CM | POA: Diagnosis not present

## 2018-02-01 DIAGNOSIS — D509 Iron deficiency anemia, unspecified: Secondary | ICD-10-CM | POA: Diagnosis not present

## 2018-02-01 DIAGNOSIS — N2581 Secondary hyperparathyroidism of renal origin: Secondary | ICD-10-CM | POA: Diagnosis not present

## 2018-02-01 DIAGNOSIS — N186 End stage renal disease: Secondary | ICD-10-CM | POA: Diagnosis not present

## 2018-02-01 DIAGNOSIS — D631 Anemia in chronic kidney disease: Secondary | ICD-10-CM | POA: Diagnosis not present

## 2018-02-02 DIAGNOSIS — E440 Moderate protein-calorie malnutrition: Secondary | ICD-10-CM | POA: Diagnosis not present

## 2018-02-02 DIAGNOSIS — K659 Peritonitis, unspecified: Secondary | ICD-10-CM | POA: Diagnosis not present

## 2018-02-02 DIAGNOSIS — D631 Anemia in chronic kidney disease: Secondary | ICD-10-CM | POA: Diagnosis not present

## 2018-02-02 DIAGNOSIS — N2581 Secondary hyperparathyroidism of renal origin: Secondary | ICD-10-CM | POA: Diagnosis not present

## 2018-02-02 DIAGNOSIS — D509 Iron deficiency anemia, unspecified: Secondary | ICD-10-CM | POA: Diagnosis not present

## 2018-02-02 DIAGNOSIS — N186 End stage renal disease: Secondary | ICD-10-CM | POA: Diagnosis not present

## 2018-02-03 DIAGNOSIS — K650 Generalized (acute) peritonitis: Secondary | ICD-10-CM | POA: Insufficient documentation

## 2018-02-03 DIAGNOSIS — R17 Unspecified jaundice: Secondary | ICD-10-CM | POA: Diagnosis not present

## 2018-02-03 DIAGNOSIS — Z992 Dependence on renal dialysis: Secondary | ICD-10-CM | POA: Diagnosis not present

## 2018-02-03 DIAGNOSIS — D509 Iron deficiency anemia, unspecified: Secondary | ICD-10-CM | POA: Diagnosis not present

## 2018-02-03 DIAGNOSIS — K659 Peritonitis, unspecified: Secondary | ICD-10-CM | POA: Diagnosis not present

## 2018-02-03 DIAGNOSIS — N186 End stage renal disease: Secondary | ICD-10-CM | POA: Diagnosis not present

## 2018-02-03 DIAGNOSIS — D631 Anemia in chronic kidney disease: Secondary | ICD-10-CM | POA: Diagnosis not present

## 2018-02-03 DIAGNOSIS — N2581 Secondary hyperparathyroidism of renal origin: Secondary | ICD-10-CM | POA: Diagnosis not present

## 2018-02-03 DIAGNOSIS — N041 Nephrotic syndrome with focal and segmental glomerular lesions: Secondary | ICD-10-CM | POA: Diagnosis not present

## 2018-02-03 DIAGNOSIS — Z79899 Other long term (current) drug therapy: Secondary | ICD-10-CM | POA: Diagnosis not present

## 2018-02-03 DIAGNOSIS — E440 Moderate protein-calorie malnutrition: Secondary | ICD-10-CM | POA: Diagnosis not present

## 2018-02-03 DIAGNOSIS — Z5189 Encounter for other specified aftercare: Secondary | ICD-10-CM | POA: Diagnosis not present

## 2018-02-04 DIAGNOSIS — D631 Anemia in chronic kidney disease: Secondary | ICD-10-CM | POA: Diagnosis not present

## 2018-02-04 DIAGNOSIS — Z79899 Other long term (current) drug therapy: Secondary | ICD-10-CM | POA: Diagnosis not present

## 2018-02-04 DIAGNOSIS — K659 Peritonitis, unspecified: Secondary | ICD-10-CM | POA: Diagnosis not present

## 2018-02-04 DIAGNOSIS — N186 End stage renal disease: Secondary | ICD-10-CM | POA: Diagnosis not present

## 2018-02-04 DIAGNOSIS — K650 Generalized (acute) peritonitis: Secondary | ICD-10-CM | POA: Diagnosis not present

## 2018-02-04 DIAGNOSIS — N2581 Secondary hyperparathyroidism of renal origin: Secondary | ICD-10-CM | POA: Diagnosis not present

## 2018-02-05 DIAGNOSIS — K650 Generalized (acute) peritonitis: Secondary | ICD-10-CM | POA: Diagnosis not present

## 2018-02-05 DIAGNOSIS — K659 Peritonitis, unspecified: Secondary | ICD-10-CM | POA: Diagnosis not present

## 2018-02-05 DIAGNOSIS — N186 End stage renal disease: Secondary | ICD-10-CM | POA: Diagnosis not present

## 2018-02-05 DIAGNOSIS — D631 Anemia in chronic kidney disease: Secondary | ICD-10-CM | POA: Diagnosis not present

## 2018-02-05 DIAGNOSIS — N2581 Secondary hyperparathyroidism of renal origin: Secondary | ICD-10-CM | POA: Diagnosis not present

## 2018-02-05 DIAGNOSIS — Z79899 Other long term (current) drug therapy: Secondary | ICD-10-CM | POA: Diagnosis not present

## 2018-02-06 DIAGNOSIS — K650 Generalized (acute) peritonitis: Secondary | ICD-10-CM | POA: Diagnosis not present

## 2018-02-06 DIAGNOSIS — N186 End stage renal disease: Secondary | ICD-10-CM | POA: Diagnosis not present

## 2018-02-06 DIAGNOSIS — K659 Peritonitis, unspecified: Secondary | ICD-10-CM | POA: Diagnosis not present

## 2018-02-06 DIAGNOSIS — D631 Anemia in chronic kidney disease: Secondary | ICD-10-CM | POA: Diagnosis not present

## 2018-02-06 DIAGNOSIS — N2581 Secondary hyperparathyroidism of renal origin: Secondary | ICD-10-CM | POA: Diagnosis not present

## 2018-02-06 DIAGNOSIS — Z79899 Other long term (current) drug therapy: Secondary | ICD-10-CM | POA: Diagnosis not present

## 2018-02-07 DIAGNOSIS — E890 Postprocedural hypothyroidism: Secondary | ICD-10-CM | POA: Diagnosis not present

## 2018-02-07 DIAGNOSIS — R82998 Other abnormal findings in urine: Secondary | ICD-10-CM | POA: Diagnosis not present

## 2018-02-07 DIAGNOSIS — N186 End stage renal disease: Secondary | ICD-10-CM | POA: Diagnosis not present

## 2018-02-07 DIAGNOSIS — D631 Anemia in chronic kidney disease: Secondary | ICD-10-CM | POA: Diagnosis not present

## 2018-02-07 DIAGNOSIS — K659 Peritonitis, unspecified: Secondary | ICD-10-CM | POA: Diagnosis not present

## 2018-02-07 DIAGNOSIS — K650 Generalized (acute) peritonitis: Secondary | ICD-10-CM | POA: Diagnosis not present

## 2018-02-07 DIAGNOSIS — Z79899 Other long term (current) drug therapy: Secondary | ICD-10-CM | POA: Diagnosis not present

## 2018-02-07 DIAGNOSIS — N2581 Secondary hyperparathyroidism of renal origin: Secondary | ICD-10-CM | POA: Diagnosis not present

## 2018-02-08 DIAGNOSIS — K650 Generalized (acute) peritonitis: Secondary | ICD-10-CM | POA: Diagnosis not present

## 2018-02-08 DIAGNOSIS — K659 Peritonitis, unspecified: Secondary | ICD-10-CM | POA: Diagnosis not present

## 2018-02-08 DIAGNOSIS — N2581 Secondary hyperparathyroidism of renal origin: Secondary | ICD-10-CM | POA: Diagnosis not present

## 2018-02-08 DIAGNOSIS — N186 End stage renal disease: Secondary | ICD-10-CM | POA: Diagnosis not present

## 2018-02-08 DIAGNOSIS — Z79899 Other long term (current) drug therapy: Secondary | ICD-10-CM | POA: Diagnosis not present

## 2018-02-08 DIAGNOSIS — D631 Anemia in chronic kidney disease: Secondary | ICD-10-CM | POA: Diagnosis not present

## 2018-02-09 DIAGNOSIS — K650 Generalized (acute) peritonitis: Secondary | ICD-10-CM | POA: Diagnosis not present

## 2018-02-09 DIAGNOSIS — Z79899 Other long term (current) drug therapy: Secondary | ICD-10-CM | POA: Diagnosis not present

## 2018-02-09 DIAGNOSIS — K659 Peritonitis, unspecified: Secondary | ICD-10-CM | POA: Diagnosis not present

## 2018-02-09 DIAGNOSIS — D631 Anemia in chronic kidney disease: Secondary | ICD-10-CM | POA: Diagnosis not present

## 2018-02-09 DIAGNOSIS — N2581 Secondary hyperparathyroidism of renal origin: Secondary | ICD-10-CM | POA: Diagnosis not present

## 2018-02-09 DIAGNOSIS — N186 End stage renal disease: Secondary | ICD-10-CM | POA: Diagnosis not present

## 2018-02-10 DIAGNOSIS — K659 Peritonitis, unspecified: Secondary | ICD-10-CM | POA: Diagnosis not present

## 2018-02-10 DIAGNOSIS — D631 Anemia in chronic kidney disease: Secondary | ICD-10-CM | POA: Diagnosis not present

## 2018-02-10 DIAGNOSIS — K650 Generalized (acute) peritonitis: Secondary | ICD-10-CM | POA: Diagnosis not present

## 2018-02-10 DIAGNOSIS — N2581 Secondary hyperparathyroidism of renal origin: Secondary | ICD-10-CM | POA: Diagnosis not present

## 2018-02-10 DIAGNOSIS — N186 End stage renal disease: Secondary | ICD-10-CM | POA: Diagnosis not present

## 2018-02-10 DIAGNOSIS — Z79899 Other long term (current) drug therapy: Secondary | ICD-10-CM | POA: Diagnosis not present

## 2018-02-11 DIAGNOSIS — K650 Generalized (acute) peritonitis: Secondary | ICD-10-CM | POA: Diagnosis not present

## 2018-02-11 DIAGNOSIS — D631 Anemia in chronic kidney disease: Secondary | ICD-10-CM | POA: Diagnosis not present

## 2018-02-11 DIAGNOSIS — K659 Peritonitis, unspecified: Secondary | ICD-10-CM | POA: Diagnosis not present

## 2018-02-11 DIAGNOSIS — Z79899 Other long term (current) drug therapy: Secondary | ICD-10-CM | POA: Diagnosis not present

## 2018-02-11 DIAGNOSIS — N2581 Secondary hyperparathyroidism of renal origin: Secondary | ICD-10-CM | POA: Diagnosis not present

## 2018-02-11 DIAGNOSIS — N186 End stage renal disease: Secondary | ICD-10-CM | POA: Diagnosis not present

## 2018-02-12 DIAGNOSIS — D631 Anemia in chronic kidney disease: Secondary | ICD-10-CM | POA: Diagnosis not present

## 2018-02-12 DIAGNOSIS — Z79899 Other long term (current) drug therapy: Secondary | ICD-10-CM | POA: Diagnosis not present

## 2018-02-12 DIAGNOSIS — N186 End stage renal disease: Secondary | ICD-10-CM | POA: Diagnosis not present

## 2018-02-12 DIAGNOSIS — K659 Peritonitis, unspecified: Secondary | ICD-10-CM | POA: Diagnosis not present

## 2018-02-12 DIAGNOSIS — K650 Generalized (acute) peritonitis: Secondary | ICD-10-CM | POA: Diagnosis not present

## 2018-02-12 DIAGNOSIS — N2581 Secondary hyperparathyroidism of renal origin: Secondary | ICD-10-CM | POA: Diagnosis not present

## 2018-02-13 DIAGNOSIS — N2581 Secondary hyperparathyroidism of renal origin: Secondary | ICD-10-CM | POA: Diagnosis not present

## 2018-02-13 DIAGNOSIS — D631 Anemia in chronic kidney disease: Secondary | ICD-10-CM | POA: Diagnosis not present

## 2018-02-13 DIAGNOSIS — K659 Peritonitis, unspecified: Secondary | ICD-10-CM | POA: Diagnosis not present

## 2018-02-13 DIAGNOSIS — N186 End stage renal disease: Secondary | ICD-10-CM | POA: Diagnosis not present

## 2018-02-13 DIAGNOSIS — K650 Generalized (acute) peritonitis: Secondary | ICD-10-CM | POA: Diagnosis not present

## 2018-02-13 DIAGNOSIS — Z79899 Other long term (current) drug therapy: Secondary | ICD-10-CM | POA: Diagnosis not present

## 2018-02-14 DIAGNOSIS — Z79899 Other long term (current) drug therapy: Secondary | ICD-10-CM | POA: Diagnosis not present

## 2018-02-14 DIAGNOSIS — K650 Generalized (acute) peritonitis: Secondary | ICD-10-CM | POA: Diagnosis not present

## 2018-02-14 DIAGNOSIS — D631 Anemia in chronic kidney disease: Secondary | ICD-10-CM | POA: Diagnosis not present

## 2018-02-14 DIAGNOSIS — N186 End stage renal disease: Secondary | ICD-10-CM | POA: Diagnosis not present

## 2018-02-14 DIAGNOSIS — N2581 Secondary hyperparathyroidism of renal origin: Secondary | ICD-10-CM | POA: Diagnosis not present

## 2018-02-14 DIAGNOSIS — K659 Peritonitis, unspecified: Secondary | ICD-10-CM | POA: Diagnosis not present

## 2018-02-15 ENCOUNTER — Telehealth: Payer: Self-pay

## 2018-02-15 DIAGNOSIS — N2581 Secondary hyperparathyroidism of renal origin: Secondary | ICD-10-CM | POA: Diagnosis not present

## 2018-02-15 DIAGNOSIS — Z79899 Other long term (current) drug therapy: Secondary | ICD-10-CM | POA: Diagnosis not present

## 2018-02-15 DIAGNOSIS — K650 Generalized (acute) peritonitis: Secondary | ICD-10-CM | POA: Diagnosis not present

## 2018-02-15 DIAGNOSIS — D631 Anemia in chronic kidney disease: Secondary | ICD-10-CM | POA: Diagnosis not present

## 2018-02-15 DIAGNOSIS — N186 End stage renal disease: Secondary | ICD-10-CM | POA: Diagnosis not present

## 2018-02-15 DIAGNOSIS — K659 Peritonitis, unspecified: Secondary | ICD-10-CM | POA: Diagnosis not present

## 2018-02-15 NOTE — Telephone Encounter (Signed)
[employer] disability form given to nurse.PEr MD notes Dr. Erlinda Hong did not label pt as disability or filled any forms out. PT had stroke 12/2016. Per Dr. Leonie Man pt needs to contact MD who filled out her original disability forms. Form will not be done. Given back to medical records.

## 2018-02-16 DIAGNOSIS — N186 End stage renal disease: Secondary | ICD-10-CM | POA: Diagnosis not present

## 2018-02-16 DIAGNOSIS — D631 Anemia in chronic kidney disease: Secondary | ICD-10-CM | POA: Diagnosis not present

## 2018-02-16 DIAGNOSIS — K659 Peritonitis, unspecified: Secondary | ICD-10-CM | POA: Diagnosis not present

## 2018-02-16 DIAGNOSIS — Z79899 Other long term (current) drug therapy: Secondary | ICD-10-CM | POA: Diagnosis not present

## 2018-02-16 DIAGNOSIS — K650 Generalized (acute) peritonitis: Secondary | ICD-10-CM | POA: Diagnosis not present

## 2018-02-16 DIAGNOSIS — N2581 Secondary hyperparathyroidism of renal origin: Secondary | ICD-10-CM | POA: Diagnosis not present

## 2018-02-17 DIAGNOSIS — Z79899 Other long term (current) drug therapy: Secondary | ICD-10-CM | POA: Diagnosis not present

## 2018-02-17 DIAGNOSIS — K659 Peritonitis, unspecified: Secondary | ICD-10-CM | POA: Diagnosis not present

## 2018-02-17 DIAGNOSIS — K650 Generalized (acute) peritonitis: Secondary | ICD-10-CM | POA: Diagnosis not present

## 2018-02-17 DIAGNOSIS — D631 Anemia in chronic kidney disease: Secondary | ICD-10-CM | POA: Diagnosis not present

## 2018-02-17 DIAGNOSIS — N186 End stage renal disease: Secondary | ICD-10-CM | POA: Diagnosis not present

## 2018-02-17 DIAGNOSIS — N2581 Secondary hyperparathyroidism of renal origin: Secondary | ICD-10-CM | POA: Diagnosis not present

## 2018-02-18 DIAGNOSIS — Z79899 Other long term (current) drug therapy: Secondary | ICD-10-CM | POA: Diagnosis not present

## 2018-02-18 DIAGNOSIS — K650 Generalized (acute) peritonitis: Secondary | ICD-10-CM | POA: Diagnosis not present

## 2018-02-18 DIAGNOSIS — K659 Peritonitis, unspecified: Secondary | ICD-10-CM | POA: Diagnosis not present

## 2018-02-18 DIAGNOSIS — N2581 Secondary hyperparathyroidism of renal origin: Secondary | ICD-10-CM | POA: Diagnosis not present

## 2018-02-18 DIAGNOSIS — N186 End stage renal disease: Secondary | ICD-10-CM | POA: Diagnosis not present

## 2018-02-18 DIAGNOSIS — D631 Anemia in chronic kidney disease: Secondary | ICD-10-CM | POA: Diagnosis not present

## 2018-02-19 DIAGNOSIS — K659 Peritonitis, unspecified: Secondary | ICD-10-CM | POA: Diagnosis not present

## 2018-02-19 DIAGNOSIS — N2581 Secondary hyperparathyroidism of renal origin: Secondary | ICD-10-CM | POA: Diagnosis not present

## 2018-02-19 DIAGNOSIS — K650 Generalized (acute) peritonitis: Secondary | ICD-10-CM | POA: Diagnosis not present

## 2018-02-19 DIAGNOSIS — D631 Anemia in chronic kidney disease: Secondary | ICD-10-CM | POA: Diagnosis not present

## 2018-02-19 DIAGNOSIS — Z79899 Other long term (current) drug therapy: Secondary | ICD-10-CM | POA: Diagnosis not present

## 2018-02-19 DIAGNOSIS — N186 End stage renal disease: Secondary | ICD-10-CM | POA: Diagnosis not present

## 2018-02-20 DIAGNOSIS — N186 End stage renal disease: Secondary | ICD-10-CM | POA: Diagnosis not present

## 2018-02-20 DIAGNOSIS — K650 Generalized (acute) peritonitis: Secondary | ICD-10-CM | POA: Diagnosis not present

## 2018-02-20 DIAGNOSIS — N2581 Secondary hyperparathyroidism of renal origin: Secondary | ICD-10-CM | POA: Diagnosis not present

## 2018-02-20 DIAGNOSIS — Z79899 Other long term (current) drug therapy: Secondary | ICD-10-CM | POA: Diagnosis not present

## 2018-02-20 DIAGNOSIS — D631 Anemia in chronic kidney disease: Secondary | ICD-10-CM | POA: Diagnosis not present

## 2018-02-20 DIAGNOSIS — K659 Peritonitis, unspecified: Secondary | ICD-10-CM | POA: Diagnosis not present

## 2018-02-21 DIAGNOSIS — K659 Peritonitis, unspecified: Secondary | ICD-10-CM | POA: Diagnosis not present

## 2018-02-21 DIAGNOSIS — Z79899 Other long term (current) drug therapy: Secondary | ICD-10-CM | POA: Diagnosis not present

## 2018-02-21 DIAGNOSIS — K650 Generalized (acute) peritonitis: Secondary | ICD-10-CM | POA: Diagnosis not present

## 2018-02-21 DIAGNOSIS — D631 Anemia in chronic kidney disease: Secondary | ICD-10-CM | POA: Diagnosis not present

## 2018-02-21 DIAGNOSIS — N186 End stage renal disease: Secondary | ICD-10-CM | POA: Diagnosis not present

## 2018-02-21 DIAGNOSIS — N2581 Secondary hyperparathyroidism of renal origin: Secondary | ICD-10-CM | POA: Diagnosis not present

## 2018-02-22 DIAGNOSIS — Z79899 Other long term (current) drug therapy: Secondary | ICD-10-CM | POA: Diagnosis not present

## 2018-02-22 DIAGNOSIS — K659 Peritonitis, unspecified: Secondary | ICD-10-CM | POA: Diagnosis not present

## 2018-02-22 DIAGNOSIS — N186 End stage renal disease: Secondary | ICD-10-CM | POA: Diagnosis not present

## 2018-02-22 DIAGNOSIS — D631 Anemia in chronic kidney disease: Secondary | ICD-10-CM | POA: Diagnosis not present

## 2018-02-22 DIAGNOSIS — K650 Generalized (acute) peritonitis: Secondary | ICD-10-CM | POA: Diagnosis not present

## 2018-02-22 DIAGNOSIS — N2581 Secondary hyperparathyroidism of renal origin: Secondary | ICD-10-CM | POA: Diagnosis not present

## 2018-02-23 DIAGNOSIS — Z79899 Other long term (current) drug therapy: Secondary | ICD-10-CM | POA: Diagnosis not present

## 2018-02-23 DIAGNOSIS — K650 Generalized (acute) peritonitis: Secondary | ICD-10-CM | POA: Diagnosis not present

## 2018-02-23 DIAGNOSIS — K659 Peritonitis, unspecified: Secondary | ICD-10-CM | POA: Diagnosis not present

## 2018-02-23 DIAGNOSIS — D631 Anemia in chronic kidney disease: Secondary | ICD-10-CM | POA: Diagnosis not present

## 2018-02-23 DIAGNOSIS — N2581 Secondary hyperparathyroidism of renal origin: Secondary | ICD-10-CM | POA: Diagnosis not present

## 2018-02-23 DIAGNOSIS — N186 End stage renal disease: Secondary | ICD-10-CM | POA: Diagnosis not present

## 2018-02-24 DIAGNOSIS — N186 End stage renal disease: Secondary | ICD-10-CM | POA: Diagnosis not present

## 2018-02-24 DIAGNOSIS — K659 Peritonitis, unspecified: Secondary | ICD-10-CM | POA: Diagnosis not present

## 2018-02-24 DIAGNOSIS — K650 Generalized (acute) peritonitis: Secondary | ICD-10-CM | POA: Diagnosis not present

## 2018-02-24 DIAGNOSIS — N2581 Secondary hyperparathyroidism of renal origin: Secondary | ICD-10-CM | POA: Diagnosis not present

## 2018-02-24 DIAGNOSIS — D631 Anemia in chronic kidney disease: Secondary | ICD-10-CM | POA: Diagnosis not present

## 2018-02-24 DIAGNOSIS — Z79899 Other long term (current) drug therapy: Secondary | ICD-10-CM | POA: Diagnosis not present

## 2018-02-25 DIAGNOSIS — Z79899 Other long term (current) drug therapy: Secondary | ICD-10-CM | POA: Diagnosis not present

## 2018-02-25 DIAGNOSIS — N186 End stage renal disease: Secondary | ICD-10-CM | POA: Diagnosis not present

## 2018-02-25 DIAGNOSIS — N2581 Secondary hyperparathyroidism of renal origin: Secondary | ICD-10-CM | POA: Diagnosis not present

## 2018-02-25 DIAGNOSIS — D631 Anemia in chronic kidney disease: Secondary | ICD-10-CM | POA: Diagnosis not present

## 2018-02-25 DIAGNOSIS — K659 Peritonitis, unspecified: Secondary | ICD-10-CM | POA: Diagnosis not present

## 2018-02-25 DIAGNOSIS — K650 Generalized (acute) peritonitis: Secondary | ICD-10-CM | POA: Diagnosis not present

## 2018-02-26 DIAGNOSIS — N2581 Secondary hyperparathyroidism of renal origin: Secondary | ICD-10-CM | POA: Diagnosis not present

## 2018-02-26 DIAGNOSIS — Z79899 Other long term (current) drug therapy: Secondary | ICD-10-CM | POA: Diagnosis not present

## 2018-02-26 DIAGNOSIS — N186 End stage renal disease: Secondary | ICD-10-CM | POA: Diagnosis not present

## 2018-02-26 DIAGNOSIS — K650 Generalized (acute) peritonitis: Secondary | ICD-10-CM | POA: Diagnosis not present

## 2018-02-26 DIAGNOSIS — K659 Peritonitis, unspecified: Secondary | ICD-10-CM | POA: Diagnosis not present

## 2018-02-26 DIAGNOSIS — D631 Anemia in chronic kidney disease: Secondary | ICD-10-CM | POA: Diagnosis not present

## 2018-02-27 DIAGNOSIS — N2581 Secondary hyperparathyroidism of renal origin: Secondary | ICD-10-CM | POA: Diagnosis not present

## 2018-02-27 DIAGNOSIS — D631 Anemia in chronic kidney disease: Secondary | ICD-10-CM | POA: Diagnosis not present

## 2018-02-27 DIAGNOSIS — K659 Peritonitis, unspecified: Secondary | ICD-10-CM | POA: Diagnosis not present

## 2018-02-27 DIAGNOSIS — Z79899 Other long term (current) drug therapy: Secondary | ICD-10-CM | POA: Diagnosis not present

## 2018-02-27 DIAGNOSIS — N186 End stage renal disease: Secondary | ICD-10-CM | POA: Diagnosis not present

## 2018-02-27 DIAGNOSIS — K650 Generalized (acute) peritonitis: Secondary | ICD-10-CM | POA: Diagnosis not present

## 2018-02-28 DIAGNOSIS — D631 Anemia in chronic kidney disease: Secondary | ICD-10-CM | POA: Diagnosis not present

## 2018-02-28 DIAGNOSIS — K650 Generalized (acute) peritonitis: Secondary | ICD-10-CM | POA: Diagnosis not present

## 2018-02-28 DIAGNOSIS — K659 Peritonitis, unspecified: Secondary | ICD-10-CM | POA: Diagnosis not present

## 2018-02-28 DIAGNOSIS — N2581 Secondary hyperparathyroidism of renal origin: Secondary | ICD-10-CM | POA: Diagnosis not present

## 2018-02-28 DIAGNOSIS — Z79899 Other long term (current) drug therapy: Secondary | ICD-10-CM | POA: Diagnosis not present

## 2018-02-28 DIAGNOSIS — N186 End stage renal disease: Secondary | ICD-10-CM | POA: Diagnosis not present

## 2018-03-01 DIAGNOSIS — K650 Generalized (acute) peritonitis: Secondary | ICD-10-CM | POA: Diagnosis not present

## 2018-03-01 DIAGNOSIS — N186 End stage renal disease: Secondary | ICD-10-CM | POA: Diagnosis not present

## 2018-03-01 DIAGNOSIS — D631 Anemia in chronic kidney disease: Secondary | ICD-10-CM | POA: Diagnosis not present

## 2018-03-01 DIAGNOSIS — Z79899 Other long term (current) drug therapy: Secondary | ICD-10-CM | POA: Diagnosis not present

## 2018-03-01 DIAGNOSIS — N2581 Secondary hyperparathyroidism of renal origin: Secondary | ICD-10-CM | POA: Diagnosis not present

## 2018-03-01 DIAGNOSIS — K659 Peritonitis, unspecified: Secondary | ICD-10-CM | POA: Diagnosis not present

## 2018-03-02 DIAGNOSIS — Z79899 Other long term (current) drug therapy: Secondary | ICD-10-CM | POA: Diagnosis not present

## 2018-03-02 DIAGNOSIS — N2581 Secondary hyperparathyroidism of renal origin: Secondary | ICD-10-CM | POA: Diagnosis not present

## 2018-03-02 DIAGNOSIS — K659 Peritonitis, unspecified: Secondary | ICD-10-CM | POA: Diagnosis not present

## 2018-03-02 DIAGNOSIS — K650 Generalized (acute) peritonitis: Secondary | ICD-10-CM | POA: Diagnosis not present

## 2018-03-02 DIAGNOSIS — D631 Anemia in chronic kidney disease: Secondary | ICD-10-CM | POA: Diagnosis not present

## 2018-03-02 DIAGNOSIS — N186 End stage renal disease: Secondary | ICD-10-CM | POA: Diagnosis not present

## 2018-03-03 DIAGNOSIS — N2581 Secondary hyperparathyroidism of renal origin: Secondary | ICD-10-CM | POA: Diagnosis not present

## 2018-03-03 DIAGNOSIS — Z79899 Other long term (current) drug therapy: Secondary | ICD-10-CM | POA: Diagnosis not present

## 2018-03-03 DIAGNOSIS — K650 Generalized (acute) peritonitis: Secondary | ICD-10-CM | POA: Diagnosis not present

## 2018-03-03 DIAGNOSIS — D631 Anemia in chronic kidney disease: Secondary | ICD-10-CM | POA: Diagnosis not present

## 2018-03-03 DIAGNOSIS — N186 End stage renal disease: Secondary | ICD-10-CM | POA: Diagnosis not present

## 2018-03-03 DIAGNOSIS — K659 Peritonitis, unspecified: Secondary | ICD-10-CM | POA: Diagnosis not present

## 2018-03-04 DIAGNOSIS — K650 Generalized (acute) peritonitis: Secondary | ICD-10-CM | POA: Diagnosis not present

## 2018-03-04 DIAGNOSIS — Z79899 Other long term (current) drug therapy: Secondary | ICD-10-CM | POA: Diagnosis not present

## 2018-03-04 DIAGNOSIS — K659 Peritonitis, unspecified: Secondary | ICD-10-CM | POA: Diagnosis not present

## 2018-03-04 DIAGNOSIS — N186 End stage renal disease: Secondary | ICD-10-CM | POA: Diagnosis not present

## 2018-03-04 DIAGNOSIS — D631 Anemia in chronic kidney disease: Secondary | ICD-10-CM | POA: Diagnosis not present

## 2018-03-04 DIAGNOSIS — N2581 Secondary hyperparathyroidism of renal origin: Secondary | ICD-10-CM | POA: Diagnosis not present

## 2018-03-05 DIAGNOSIS — N2581 Secondary hyperparathyroidism of renal origin: Secondary | ICD-10-CM | POA: Diagnosis not present

## 2018-03-05 DIAGNOSIS — K650 Generalized (acute) peritonitis: Secondary | ICD-10-CM | POA: Diagnosis not present

## 2018-03-05 DIAGNOSIS — K659 Peritonitis, unspecified: Secondary | ICD-10-CM | POA: Diagnosis not present

## 2018-03-05 DIAGNOSIS — Z79899 Other long term (current) drug therapy: Secondary | ICD-10-CM | POA: Diagnosis not present

## 2018-03-05 DIAGNOSIS — N186 End stage renal disease: Secondary | ICD-10-CM | POA: Diagnosis not present

## 2018-03-05 DIAGNOSIS — D631 Anemia in chronic kidney disease: Secondary | ICD-10-CM | POA: Diagnosis not present

## 2018-03-06 DIAGNOSIS — Z79899 Other long term (current) drug therapy: Secondary | ICD-10-CM | POA: Diagnosis not present

## 2018-03-06 DIAGNOSIS — R17 Unspecified jaundice: Secondary | ICD-10-CM | POA: Diagnosis not present

## 2018-03-06 DIAGNOSIS — N2581 Secondary hyperparathyroidism of renal origin: Secondary | ICD-10-CM | POA: Diagnosis not present

## 2018-03-06 DIAGNOSIS — D509 Iron deficiency anemia, unspecified: Secondary | ICD-10-CM | POA: Diagnosis not present

## 2018-03-06 DIAGNOSIS — Z5189 Encounter for other specified aftercare: Secondary | ICD-10-CM | POA: Diagnosis not present

## 2018-03-06 DIAGNOSIS — N186 End stage renal disease: Secondary | ICD-10-CM | POA: Diagnosis not present

## 2018-03-06 DIAGNOSIS — E440 Moderate protein-calorie malnutrition: Secondary | ICD-10-CM | POA: Diagnosis not present

## 2018-03-06 DIAGNOSIS — D631 Anemia in chronic kidney disease: Secondary | ICD-10-CM | POA: Diagnosis not present

## 2018-03-07 DIAGNOSIS — D509 Iron deficiency anemia, unspecified: Secondary | ICD-10-CM | POA: Diagnosis not present

## 2018-03-07 DIAGNOSIS — N2581 Secondary hyperparathyroidism of renal origin: Secondary | ICD-10-CM | POA: Diagnosis not present

## 2018-03-07 DIAGNOSIS — Z5189 Encounter for other specified aftercare: Secondary | ICD-10-CM | POA: Diagnosis not present

## 2018-03-07 DIAGNOSIS — R17 Unspecified jaundice: Secondary | ICD-10-CM | POA: Diagnosis not present

## 2018-03-07 DIAGNOSIS — N186 End stage renal disease: Secondary | ICD-10-CM | POA: Diagnosis not present

## 2018-03-07 DIAGNOSIS — Z79899 Other long term (current) drug therapy: Secondary | ICD-10-CM | POA: Diagnosis not present

## 2018-03-08 DIAGNOSIS — D509 Iron deficiency anemia, unspecified: Secondary | ICD-10-CM | POA: Diagnosis not present

## 2018-03-08 DIAGNOSIS — Z5189 Encounter for other specified aftercare: Secondary | ICD-10-CM | POA: Diagnosis not present

## 2018-03-08 DIAGNOSIS — Z79899 Other long term (current) drug therapy: Secondary | ICD-10-CM | POA: Diagnosis not present

## 2018-03-08 DIAGNOSIS — R17 Unspecified jaundice: Secondary | ICD-10-CM | POA: Diagnosis not present

## 2018-03-08 DIAGNOSIS — N2581 Secondary hyperparathyroidism of renal origin: Secondary | ICD-10-CM | POA: Diagnosis not present

## 2018-03-08 DIAGNOSIS — N186 End stage renal disease: Secondary | ICD-10-CM | POA: Diagnosis not present

## 2018-03-09 ENCOUNTER — Encounter (HOSPITAL_COMMUNITY): Payer: Self-pay | Admitting: Emergency Medicine

## 2018-03-09 ENCOUNTER — Inpatient Hospital Stay (HOSPITAL_COMMUNITY)
Admission: EM | Admit: 2018-03-09 | Discharge: 2018-03-12 | DRG: 291 | Disposition: A | Discharge status: Home / Self Care | Payer: Medicare Other | Attending: Internal Medicine | Admitting: Internal Medicine

## 2018-03-09 ENCOUNTER — Other Ambulatory Visit: Payer: Self-pay

## 2018-03-09 ENCOUNTER — Emergency Department (HOSPITAL_COMMUNITY): Payer: Medicare Other

## 2018-03-09 DIAGNOSIS — N186 End stage renal disease: Secondary | ICD-10-CM | POA: Diagnosis present

## 2018-03-09 DIAGNOSIS — I469 Cardiac arrest, cause unspecified: Secondary | ICD-10-CM | POA: Diagnosis not present

## 2018-03-09 DIAGNOSIS — E8889 Other specified metabolic disorders: Secondary | ICD-10-CM | POA: Diagnosis present

## 2018-03-09 DIAGNOSIS — I69398 Other sequelae of cerebral infarction: Secondary | ICD-10-CM | POA: Diagnosis not present

## 2018-03-09 DIAGNOSIS — R600 Localized edema: Secondary | ICD-10-CM

## 2018-03-09 DIAGNOSIS — R609 Edema, unspecified: Secondary | ICD-10-CM

## 2018-03-09 DIAGNOSIS — E8770 Fluid overload, unspecified: Secondary | ICD-10-CM | POA: Diagnosis not present

## 2018-03-09 DIAGNOSIS — E785 Hyperlipidemia, unspecified: Secondary | ICD-10-CM | POA: Diagnosis present

## 2018-03-09 DIAGNOSIS — G8114 Spastic hemiplegia affecting left nondominant side: Secondary | ICD-10-CM | POA: Diagnosis present

## 2018-03-09 DIAGNOSIS — N2581 Secondary hyperparathyroidism of renal origin: Secondary | ICD-10-CM | POA: Diagnosis present

## 2018-03-09 DIAGNOSIS — R569 Unspecified convulsions: Secondary | ICD-10-CM | POA: Diagnosis not present

## 2018-03-09 DIAGNOSIS — Z841 Family history of disorders of kidney and ureter: Secondary | ICD-10-CM | POA: Diagnosis not present

## 2018-03-09 DIAGNOSIS — Z905 Acquired absence of kidney: Secondary | ICD-10-CM | POA: Diagnosis not present

## 2018-03-09 DIAGNOSIS — R17 Unspecified jaundice: Secondary | ICD-10-CM | POA: Diagnosis not present

## 2018-03-09 DIAGNOSIS — I5033 Acute on chronic diastolic (congestive) heart failure: Secondary | ICD-10-CM | POA: Diagnosis present

## 2018-03-09 DIAGNOSIS — E875 Hyperkalemia: Secondary | ICD-10-CM | POA: Diagnosis present

## 2018-03-09 DIAGNOSIS — I10 Essential (primary) hypertension: Secondary | ICD-10-CM | POA: Diagnosis not present

## 2018-03-09 DIAGNOSIS — G629 Polyneuropathy, unspecified: Secondary | ICD-10-CM | POA: Diagnosis present

## 2018-03-09 DIAGNOSIS — R82998 Other abnormal findings in urine: Secondary | ICD-10-CM | POA: Diagnosis not present

## 2018-03-09 DIAGNOSIS — Z888 Allergy status to other drugs, medicaments and biological substances status: Secondary | ICD-10-CM

## 2018-03-09 DIAGNOSIS — D631 Anemia in chronic kidney disease: Secondary | ICD-10-CM | POA: Diagnosis not present

## 2018-03-09 DIAGNOSIS — J90 Pleural effusion, not elsewhere classified: Secondary | ICD-10-CM | POA: Diagnosis not present

## 2018-03-09 DIAGNOSIS — G40909 Epilepsy, unspecified, not intractable, without status epilepticus: Secondary | ICD-10-CM | POA: Diagnosis not present

## 2018-03-09 DIAGNOSIS — R0602 Shortness of breath: Secondary | ICD-10-CM

## 2018-03-09 DIAGNOSIS — I132 Hypertensive heart and chronic kidney disease with heart failure and with stage 5 chronic kidney disease, or end stage renal disease: Secondary | ICD-10-CM | POA: Diagnosis present

## 2018-03-09 DIAGNOSIS — Z5189 Encounter for other specified aftercare: Secondary | ICD-10-CM | POA: Diagnosis not present

## 2018-03-09 DIAGNOSIS — Z8249 Family history of ischemic heart disease and other diseases of the circulatory system: Secondary | ICD-10-CM | POA: Diagnosis not present

## 2018-03-09 DIAGNOSIS — I773 Arterial fibromuscular dysplasia: Secondary | ICD-10-CM | POA: Diagnosis not present

## 2018-03-09 DIAGNOSIS — D509 Iron deficiency anemia, unspecified: Secondary | ICD-10-CM | POA: Diagnosis not present

## 2018-03-09 DIAGNOSIS — Z85528 Personal history of other malignant neoplasm of kidney: Secondary | ICD-10-CM | POA: Diagnosis not present

## 2018-03-09 DIAGNOSIS — Z992 Dependence on renal dialysis: Secondary | ICD-10-CM | POA: Diagnosis not present

## 2018-03-09 DIAGNOSIS — M7989 Other specified soft tissue disorders: Secondary | ICD-10-CM | POA: Diagnosis not present

## 2018-03-09 DIAGNOSIS — Z79890 Hormone replacement therapy (postmenopausal): Secondary | ICD-10-CM

## 2018-03-09 DIAGNOSIS — E890 Postprocedural hypothyroidism: Secondary | ICD-10-CM | POA: Diagnosis not present

## 2018-03-09 DIAGNOSIS — R079 Chest pain, unspecified: Secondary | ICD-10-CM | POA: Diagnosis not present

## 2018-03-09 DIAGNOSIS — I69354 Hemiplegia and hemiparesis following cerebral infarction affecting left non-dominant side: Secondary | ICD-10-CM

## 2018-03-09 DIAGNOSIS — R011 Cardiac murmur, unspecified: Secondary | ICD-10-CM | POA: Diagnosis not present

## 2018-03-09 DIAGNOSIS — Z79899 Other long term (current) drug therapy: Secondary | ICD-10-CM | POA: Diagnosis not present

## 2018-03-09 DIAGNOSIS — E039 Hypothyroidism, unspecified: Secondary | ICD-10-CM | POA: Diagnosis present

## 2018-03-09 DIAGNOSIS — I361 Nonrheumatic tricuspid (valve) insufficiency: Secondary | ICD-10-CM | POA: Diagnosis not present

## 2018-03-09 DIAGNOSIS — N19 Unspecified kidney failure: Secondary | ICD-10-CM | POA: Diagnosis not present

## 2018-03-09 DIAGNOSIS — I69154 Hemiplegia and hemiparesis following nontraumatic intracerebral hemorrhage affecting left non-dominant side: Secondary | ICD-10-CM | POA: Diagnosis not present

## 2018-03-09 DIAGNOSIS — I509 Heart failure, unspecified: Secondary | ICD-10-CM | POA: Diagnosis not present

## 2018-03-09 DIAGNOSIS — Z7982 Long term (current) use of aspirin: Secondary | ICD-10-CM | POA: Diagnosis not present

## 2018-03-09 DIAGNOSIS — E8779 Other fluid overload: Secondary | ICD-10-CM | POA: Diagnosis not present

## 2018-03-09 DIAGNOSIS — I120 Hypertensive chronic kidney disease with stage 5 chronic kidney disease or end stage renal disease: Secondary | ICD-10-CM | POA: Diagnosis not present

## 2018-03-09 DIAGNOSIS — T82868A Thrombosis of vascular prosthetic devices, implants and grafts, initial encounter: Secondary | ICD-10-CM | POA: Diagnosis not present

## 2018-03-09 LAB — CBC
HCT: 43.4 % (ref 36.0–46.0)
Hemoglobin: 13.3 g/dL (ref 12.0–15.0)
MCH: 30.8 pg (ref 26.0–34.0)
MCHC: 30.6 g/dL (ref 30.0–36.0)
MCV: 100.5 fL — ABNORMAL HIGH (ref 78.0–100.0)
PLATELETS: 252 10*3/uL (ref 150–400)
RBC: 4.32 MIL/uL (ref 3.87–5.11)
RDW: 16.1 % — ABNORMAL HIGH (ref 11.5–15.5)
WBC: 8.4 10*3/uL (ref 4.0–10.5)

## 2018-03-09 LAB — COMPREHENSIVE METABOLIC PANEL
ALT: 13 U/L (ref 0–44)
AST: 23 U/L (ref 15–41)
Albumin: 3 g/dL — ABNORMAL LOW (ref 3.5–5.0)
Alkaline Phosphatase: 57 U/L (ref 38–126)
Anion gap: 24 — ABNORMAL HIGH (ref 5–15)
BUN: 101 mg/dL — AB (ref 6–20)
CHLORIDE: 103 mmol/L (ref 98–111)
CO2: 17 mmol/L — AB (ref 22–32)
CREATININE: 19.77 mg/dL — AB (ref 0.44–1.00)
Calcium: 7.6 mg/dL — ABNORMAL LOW (ref 8.9–10.3)
GFR calc Af Amer: 2 mL/min — ABNORMAL LOW (ref >60)
GFR calc non Af Amer: 2 mL/min — ABNORMAL LOW (ref >60)
Glucose, Bld: 104 mg/dL — ABNORMAL HIGH (ref 70–99)
Potassium: 5.4 mmol/L — ABNORMAL HIGH (ref 3.5–5.1)
SODIUM: 144 mmol/L (ref 135–145)
Total Bilirubin: 0.9 mg/dL (ref 0.3–1.2)
Total Protein: 6.1 g/dL — ABNORMAL LOW (ref 6.5–8.1)

## 2018-03-09 LAB — I-STAT CHEM 8, ED
BUN: 104 mg/dL — ABNORMAL HIGH (ref 6–20)
Calcium, Ion: 0.84 mmol/L — CL (ref 1.15–1.40)
Chloride: 109 mmol/L (ref 98–111)
Creatinine, Ser: >18 mg/dL — ABNORMAL HIGH (ref 0.44–1.00)
Glucose, Bld: 97 mg/dL (ref 70–99)
HCT: 42 % (ref 36.0–46.0)
Hemoglobin: 14.3 g/dL (ref 12.0–15.0)
Potassium: 5.4 mmol/L — ABNORMAL HIGH (ref 3.5–5.1)
Sodium: 139 mmol/L (ref 135–145)
TCO2: 21 mmol/L — ABNORMAL LOW (ref 22–32)

## 2018-03-09 MED ORDER — CARVEDILOL 25 MG PO TABS
25.0000 mg | ORAL_TABLET | Freq: Two times a day (BID) | ORAL | Status: DC
  Administered 2018-03-10 – 2018-03-11 (×4): 25 mg via ORAL
  Filled 2018-03-09: qty 1
  Filled 2018-03-09: qty 2
  Filled 2018-03-09: qty 1
  Filled 2018-03-09: qty 2

## 2018-03-09 MED ORDER — CINACALCET HCL 30 MG PO TABS
60.0000 mg | ORAL_TABLET | ORAL | Status: DC
  Administered 2018-03-11: 60 mg via ORAL
  Filled 2018-03-09: qty 2

## 2018-03-09 MED ORDER — ACETAMINOPHEN 325 MG PO TABS
650.0000 mg | ORAL_TABLET | Freq: Four times a day (QID) | ORAL | Status: DC | PRN
  Administered 2018-03-10: 650 mg via ORAL
  Filled 2018-03-09: qty 2

## 2018-03-09 MED ORDER — ADULT MULTIVITAMIN W/MINERALS CH
1.0000 | ORAL_TABLET | Freq: Every day | ORAL | Status: DC
  Administered 2018-03-10 – 2018-03-12 (×3): 1 via ORAL
  Filled 2018-03-09 (×3): qty 1

## 2018-03-09 MED ORDER — ASPIRIN EC 81 MG PO TBEC
81.0000 mg | DELAYED_RELEASE_TABLET | Freq: Every day | ORAL | Status: DC
  Administered 2018-03-10 – 2018-03-12 (×3): 81 mg via ORAL
  Filled 2018-03-09 (×3): qty 1

## 2018-03-09 MED ORDER — ACETAMINOPHEN 650 MG RE SUPP: 650.0000 mg | Freq: Four times a day (QID) | RECTAL | Status: DC | PRN

## 2018-03-09 MED ORDER — LEVOTHYROXINE SODIUM 112 MCG PO TABS
112.0000 ug | ORAL_TABLET | Freq: Every day | ORAL | Status: DC
  Administered 2018-03-10 – 2018-03-12 (×4): 112 ug via ORAL
  Filled 2018-03-09 (×4): qty 1

## 2018-03-09 MED ORDER — LEVETIRACETAM 500 MG PO TABS
500.0000 mg | ORAL_TABLET | Freq: Two times a day (BID) | ORAL | Status: DC
  Administered 2018-03-10 – 2018-03-11 (×4): 500 mg via ORAL
  Filled 2018-03-09 (×4): qty 1

## 2018-03-09 MED ORDER — CALCIUM ACETATE (PHOS BINDER) 667 MG PO CAPS
1334.0000 mg | ORAL_CAPSULE | Freq: Three times a day (TID) | ORAL | Status: DC
  Administered 2018-03-10 – 2018-03-12 (×4): 1334 mg via ORAL
  Filled 2018-03-09 (×4): qty 2

## 2018-03-09 MED ORDER — AMLODIPINE BESYLATE 10 MG PO TABS
10.0000 mg | ORAL_TABLET | Freq: Every day | ORAL | Status: DC
  Administered 2018-03-10 – 2018-03-12 (×4): 10 mg via ORAL
  Filled 2018-03-09 (×2): qty 1
  Filled 2018-03-09 (×2): qty 2

## 2018-03-09 MED ORDER — SODIUM CHLORIDE 0.9% FLUSH
3.0000 mL | Freq: Two times a day (BID) | INTRAVENOUS | Status: DC
  Administered 2018-03-11: 3 mL via INTRAVENOUS

## 2018-03-09 MED ORDER — HYDRALAZINE HCL 20 MG/ML IJ SOLN
10.0000 mg | Freq: Four times a day (QID) | INTRAMUSCULAR | Status: DC | PRN
  Administered 2018-03-10 – 2018-03-11 (×4): 10 mg via INTRAVENOUS
  Filled 2018-03-09 (×5): qty 1

## 2018-03-09 MED ORDER — HEPARIN SODIUM (PORCINE) 5000 UNIT/ML IJ SOLN
5000.0000 [IU] | Freq: Three times a day (TID) | INTRAMUSCULAR | Status: DC
  Administered 2018-03-10 (×2): 5000 [IU] via SUBCUTANEOUS
  Filled 2018-03-09: qty 1

## 2018-03-09 MED ORDER — PANTOPRAZOLE SODIUM 40 MG PO TBEC
40.0000 mg | DELAYED_RELEASE_TABLET | Freq: Two times a day (BID) | ORAL | Status: DC
  Administered 2018-03-10 – 2018-03-11 (×4): 40 mg via ORAL
  Filled 2018-03-09 (×4): qty 1

## 2018-03-09 MED ORDER — FUROSEMIDE 10 MG/ML IJ SOLN
160.0000 mg | Freq: Once | INTRAVENOUS | Status: AC
  Administered 2018-03-10: 160 mg via INTRAVENOUS
  Filled 2018-03-09: qty 10

## 2018-03-09 MED ORDER — GABAPENTIN 100 MG PO CAPS
100.0000 mg | ORAL_CAPSULE | Freq: Every day | ORAL | Status: DC
  Administered 2018-03-10 – 2018-03-11 (×3): 100 mg via ORAL
  Filled 2018-03-09 (×3): qty 1

## 2018-03-09 NOTE — H&P (Signed)
Date: 03/10/2018               Patient Name:  Terry Juarez MRN: [id number]  DOB: Dec 06, 1965 Age / Sex: 52 y.o., female   PCP: Tisovec, Fransico Him, MD         Medical Service: Internal Medicine Teaching Service         Attending Physician: Dr. Sid Falcon, MD    First Contact: MS4 Steen Pager: [phone]  Second Contact: Dr. Berline Lopes Pager: [phone]       After Hours (After 5p/  First Contact Pager: [phone]  weekends / holidays): Second Contact Pager: [phone]   Chief Complaint: dyspnea  History of Present Illness:  Terry Juarez is a 52 yo female with a medical history of ESRD on PD, hemorrhagic CVA with residual seizures on Keppra and LUE weakness, hypothyroidism, and hypertension who presents with leg swelling and dyspnea for the past 3 weeks. She is accompanied by her home health nurse who helps provide the history. She has been on peritoneal dialysis for two years. Over the past three weeks, her dialysis has not been effectively removing fluid. They have tried to change the duration of PD and the solution strength with no improvement. She started lasix 80 mg BID about a week ago. At baseline she makes little urine, and the lasix causes a mild increase in urine output. During the past three weeks she has had gradual onset of lower extremity swelling, dyspnea, and orthopnea. She has been unable to lay flat and has been using a large pillow to prop herself up at night. She also has marked swelling of her left hand and arm, which she is unable to move s/p CVA. Her nurse reports that her dry weight is 51 kg and her current weight is 74 kg (50 lb weight gain in 3 weeks). The patient also reports that her blood pressures have been running high, which she believes is causing a headache (throbbing, frontal) and blurry vision. She denies fevers, chills, chest pain, or abdominal pain.  Of note, she had a right-sided frontal lobe hemorrhage CVA in June of 2018 with associated seizures and L  hemiparesis. During her hospitalization for this event she was transitioned from PD to HD, but had a cardiac arrest during hemodialysis using her right upper arm AVF. ROSC was achieved with atropine without defibrillation or compressions. She declined another trial of HD and resumed PD.  In the ED, vitals were T 98.6, RR 18, HR 79, BP 181/101, O2 sat 96% on room air. Labs were significant for K 5.4, CO2 17, BUN 101, Cr 19.77, and Ca 7.6. EKG showed NSR without changes from prior. CXR showed cardiomegaly with mild CHF and bilateral pleural effusions (L > R). The case was discussed with nephrology who recommended placing a tunneled cath for HD.  Meds:  Current Meds  Medication Sig  . acetaminophen (TYLENOL) 500 MG tablet Take 1,000 mg by mouth every 6 (six) hours as needed for moderate pain or headache.  Marland Kitchen amLODipine (NORVASC) 10 MG tablet Take 1 tablet (10 mg total) by mouth daily.  Marland Kitchen aspirin EC 81 MG tablet Take 1 tablet (81 mg total) by mouth daily.  . calcium acetate (PHOSLO) 667 MG capsule Take 2 capsules (1,334 mg total) by mouth 3 (three) times daily with meals. (Patient taking differently: Take 1,334 mg by mouth See admin instructions. Take 2 capsules (1,334 mg) by mouth up to four times daily - with meals and snacks)  .  carvedilol (COREG) 25 MG tablet Take 25 mg by mouth 2 (two) times daily.   . cinacalcet (SENSIPAR) 60 MG tablet Take 60 mg by mouth every Monday, Wednesday, and Friday.   . furosemide (LASIX) 80 MG tablet Take 80 mg by mouth 2 (two) times daily.  Marland Kitchen gabapentin (NEURONTIN) 100 MG capsule Take 100 mg by mouth at bedtime.  Marland Kitchen gentamicin cream (GARAMYCIN) 0.1 % Apply 1 application topically daily.  . heparin 1000 UNIT/ML injection Inject 5,000 Units into the vein as needed (fiber in her catheter). In 5L of fluid  . levETIRAcetam (KEPPRA) 500 MG tablet Take 1 tablet (500 mg total) by mouth 2 (two) times daily.  Marland Kitchen levothyroxine (SYNTHROID, LEVOTHROID) 112 MCG tablet Take 112 mcg by  mouth daily.  . multivitamin (RENA-VIT) TABS tablet Take 1 tablet by mouth daily.  . pantoprazole (PROTONIX) 40 MG tablet Take 1 tablet (40 mg total) by mouth 2 (two) times daily.   Allergies: Allergies as of 03/09/2018 - Review Complete 03/09/2018  Allergen Reaction Noted  . Lisinopril Cough 11/12/2016   Past Medical History:  Diagnosis Date  . Anemia   . Bruises easily   . Dialysis patient ([place])   . ESRD on dialysis ([place])   . Hyperlipidemia   . Hypertension   . Renal disorder    rt renal mass / < functioning of left kidney - being prepared for possible dialysis  . Right renal mass   . Stroke [facility])    Past Surgical History:  Procedure Laterality Date  . AV FISTULA PLACEMENT Right 07/11/2014   Procedure: ARTERIOVENOUS (AV) FISTULA CREATION RIGHT ARM BRACHIO-CEPHALIC WITH ATTEMPTED RADIO-CEPHALIC (AV) FISTULA;  Surgeon: Mal Misty, MD;  Location: [place];  Service: Vascular;  Laterality: Right;  . COLONOSCOPY WITH PROPOFOL N/A 09/04/2016   Procedure: COLONOSCOPY WITH PROPOFOL;  Surgeon: Carol Ada, MD;  [facility];  Service: Endoscopy;  Laterality: N/A;  . ECTOPIC PREGNANCY SURGERY  1987  . ESOPHAGOGASTRODUODENOSCOPY N/A 07/30/2016   Procedure: ESOPHAGOGASTRODUODENOSCOPY (EGD);  Surgeon: Carol Ada, MD;  Location: [facility] ENDOSCOPY;  Service: Endoscopy;  Laterality: N/A;  Bedside  . ESOPHAGOGASTRODUODENOSCOPY (EGD) WITH PROPOFOL N/A 09/04/2016   Procedure: ESOPHAGOGASTRODUODENOSCOPY (EGD) WITH PROPOFOL;  Surgeon: Carol Ada, MD;  [facility];  Service: Endoscopy;  Laterality: N/A;  . ESOPHAGOGASTRODUODENOSCOPY (EGD) WITH PROPOFOL N/A 08/27/2017   Procedure: ESOPHAGOGASTRODUODENOSCOPY (EGD) WITH PROPOFOL;  Surgeon: Carol Ada, MD;  [facility];  Service: Endoscopy;  Laterality: N/A;  . INSERTION OF DIALYSIS CATHETER Right 07/11/2014   Procedure: INSERTION OF DIALYSIS CATHETER IN RIGHT INTERNAL JUGULAR ;  Surgeon: Mal Misty, MD;  Location: [place];   Service: Vascular;  Laterality: Right;  . LAPAROSCOPIC NEPHRECTOMY Right 07/25/2014   Procedure: RIGHT LAPAROSCOPIC RADICAL NEPHRECTOMY ;  Surgeon: Ardis Hughs, MD;  [facility];  Service: Urology;  Laterality: Right;  . PATCH ANGIOPLASTY Right 07/11/2014   Procedure: PATCH ANGIOPLASTY OF RIGHT RADIAL ARTERY USING CEPHALIC VEIN.;  Surgeon: Mal Misty, MD;  Location: [place];  Service: Vascular;  Laterality: Right;  . THROMBECTOMY W/ EMBOLECTOMY Right 07/11/2014   Procedure: THROMBECTOMY OF RIGHT RADIAL ARTERY  ;  Surgeon: Mal Misty, MD;  Location[facility] OR;  Service: Vascular;  Laterality: Right;    Family History: Father has kidney disease and HTN.  Social History: Lives at home with her father and niece. She is functionally independent and mobile with a cane. She is a never smoker. She denies etoh or illicit drug use.  Review of Systems: A complete ROS was negative except as per HPI.   Physical Exam: Blood pressure (!) 186/109, pulse 88, temperature 98.3 F (36.8 C), temperature source Oral, resp. rate 18, height 5\' 5"  (1.651 m), weight 73.8 kg, SpO2 92 %.   Constitutional: Well-developed, well-nourished, and in no distress.  Eyes: Pupils are equal, round, and reactive to light. EOM are normal.   Cardiovascular: Normal rate and regular rhythm. Continuous murmur. Pulmonary/Chest: Effort normal. Coarse breath sounds. Bibasilar crackles. Abdominal: PD catheter in place without signs of infection. Bowel sounds present. Mildly distended. Non-tender to palpation. Ext: Right upper extremity AVF with bruit. Left hand and arm swelling. No right hand or arm swelling. 2+ pitting edema bilateral lower extremities. Neuro: Alert and oriented x3. 5/5 strength throughout except 2/5 strength in the LUE. Skin: Warm and dry. No rashes or wounds.   EKG: personally reviewed my interpretation is NSR.  CXR: personally reviewed my interpretation is pulmonary edema with Kerley B lines and  bilateral pleural effusions.  Assessment & Plan by Problem: Active Problems:   Volume overload  Ms. Smither is a 52 yo female with a medical history of ESRD on PD, hemorrhagic CVA with residual seizures on Keppra and LUE weakness, hypothyroidism, and hypertension who presents with dyspnea, orthopnea, lower extremity swelling, and a 50 lb weight gain in 3 weeks. Her presentation is consistent with fluid overload in the setting of PD failure  Dyspnea in the setting of volume overload 2/2 to PD failure ESRD on PD - Pt presents with dyspnea, orthopnea, lower extremity swelling, left hand and arm swelling, and 50 lb weight gain in 3 weeks - CXR and physical exam show signs of volume overload. Satting well on room air. Mild hyperkalemia without EKG changes. No indications for emergent dialysis. - Volume overload despite PD may be secondary to ultrafiltration failure (changes of the peritoneal membrane in response to PD can lead to failure to effectively pull off fluid). Nephrology recommends tunneled cath and HD.  - History of cardiac arrest during HD in 12/2016 - No history of heart failure (ECHO 07/2016 EF 60%) Plan - Consult nephrology in the am.  - Lasix 160 mg IV - Kayexalate 30 g for mild hyperkalemia - Daily BMP  - Tele - Continue home Phoslo and cinacalcet - Daily weights - Strict I/Os - NPO in anticipation of tunneled cath placement  LUE Edema - The right hand and arm is markedly swollen compared to the left. She also is tender in the LUE. This area may be worse in the setting of volume overload because she can't move it. DDx also includes venous thrombus.  Plan - LUE venous duplex  HTN - Hypertensive in the 785Y-850Y systolic with associated headache and blurry vision likely 2/2 volume overload. She has history of hemorrhagic stroke. Plan - Continue home Amlodipine and Coreg  - Hydralazine 10 mg IV q6hrs PRN sBP > 180   Hx of CVA Fibromuscular dysplasia of bilateral ICAs -  Continue home aspirin 81 mg  - Treat HTN as above - PT/OT eval  Seizure disorder - Continue home Keppra 500 mg BID  Hypothyroidism - Continue home synthroid 112 mcg QD  Peripheral Neuropathy - Continue home gabapentin  FEN: no IV fluids, NPO diet, replace electrolytes as needed  DVT ppx:  heparin Code status: FULL code  Dispo: Admit patient to Inpatient with expected length of stay greater than 2 midnights.  Signed: Corinne Ports, MD 03/10/2018, 2:39 AM  Pager: [phone]

## 2018-03-09 NOTE — ED Provider Notes (Signed)
Patient placed in Quick Look pathway, seen and evaluated   Chief Complaint: Dyspnea  HPI:   52 year old female with a h/o of ESRD on peritoneal dialysis and CVA presenting with dyspnea, leg swelling, weight gain, and orthopnea over the last 2 weeks.  Reports that her dry weight is estimated to be around 120 pounds.  She states that her dialysis nurse thinks that she is put on approximately 50 pounds over the last few weeks due to her PD not pulling the fluid off.   ROS: Dyspnea, weight gain, leg swelling, orthopnea   Physical Exam:   Gen: No distress  Neuro: Awake and Alert  Skin: Warm    Focused Exam: Bibasilar crackles.  3+ pitting edema in the bilateral lower extremities.   Initiation of care has begun. The patient has been counseled on the process, plan, and necessity for staying for the completion/evaluation, and the remainder of the medical screening examination    Joanne Gavel, PA-C 03/09/18 1816    Charlesetta Shanks, MD 03/24/18 1046

## 2018-03-09 NOTE — ED Provider Notes (Signed)
[facility] EMERGENCY DEPARTMENT Provider Note   CSN: [id number] Arrival date & time: 03/09/18  1734     History   Chief Complaint Chief Complaint  Patient presents with  . Shortness of Breath    HPI Terry Juarez is a 52 y.o. female.  52 yo F with a chief complaint of worsening edema and shortness of breath.  Going on for the past week or so.  There is concerned that her peritoneal dialysis is failed.  She tried multiple treatments today without improvement.  Eventually they contacted her nephrologist, Dr. [name] she recommended that she come to the ED for evaluation.  The history is provided by the patient.  Illness  This is a new problem. The current episode started yesterday. The problem occurs constantly. The problem has not changed since onset.Associated symptoms include shortness of breath. Pertinent negatives include no chest pain and no headaches. Nothing aggravates the symptoms. Nothing relieves the symptoms. She has tried nothing for the symptoms. The treatment provided no relief.    Past Medical History:  Diagnosis Date  . Anemia   . Bruises easily   . Dialysis patient ([place])   . ESRD on dialysis ([place])   . Hyperlipidemia   . Hypertension   . Renal disorder    rt renal mass / < functioning of left kidney - being prepared for possible dialysis  . Right renal mass   . Stroke [facility])     Patient Active Problem List   Diagnosis Date Noted  . History of completed stroke 01/22/2018  . End-stage renal disease on peritoneal dialysis ([place])   . Left spastic hemiplegia ([place]) 08/06/2017  . Anemia due to chronic kidney disease, on chronic dialysis ([place]) 07/28/2017  . Hypertension secondary to other renal disorders 04/08/2017  . Left spastic hemiparesis ([place])   . Flaccid hemiplegia of left nondominant side as late effect of nontraumatic intraparenchymal hemorrhage of brain ([place])   . Hypothyroidism   . Seizure disorder ([place])   . Symptomatic anemia  07/29/2016  . ESRD on peritoneal dialysis ([place]) 02/12/2015  . Hyperlipidemia   . Cephalalgia   . Right renal mass 07/25/2014    Past Surgical History:  Procedure Laterality Date  . AV FISTULA PLACEMENT Right 07/11/2014   Procedure: ARTERIOVENOUS (AV) FISTULA CREATION RIGHT ARM BRACHIO-CEPHALIC WITH ATTEMPTED RADIO-CEPHALIC (AV) FISTULA;  Surgeon: Mal Misty, MD;  Location: [place];  Service: Vascular;  Laterality: Right;  . COLONOSCOPY WITH PROPOFOL N/A 09/04/2016   Procedure: COLONOSCOPY WITH PROPOFOL;  Surgeon: Carol Ada, MD;  [facility];  Service: Endoscopy;  Laterality: N/A;  . ECTOPIC PREGNANCY SURGERY  1987  . ESOPHAGOGASTRODUODENOSCOPY N/A 07/30/2016   Procedure: ESOPHAGOGASTRODUODENOSCOPY (EGD);  Surgeon: Carol Ada, MD;  Location: [facility] ENDOSCOPY;  Service: Endoscopy;  Laterality: N/A;  Bedside  . ESOPHAGOGASTRODUODENOSCOPY (EGD) WITH PROPOFOL N/A 09/04/2016   Procedure: ESOPHAGOGASTRODUODENOSCOPY (EGD) WITH PROPOFOL;  Surgeon: Carol Ada, MD;  [facility];  Service: Endoscopy;  Laterality: N/A;  . ESOPHAGOGASTRODUODENOSCOPY (EGD) WITH PROPOFOL N/A 08/27/2017   Procedure: ESOPHAGOGASTRODUODENOSCOPY (EGD) WITH PROPOFOL;  Surgeon: Carol Ada, MD;  [facility];  Service: Endoscopy;  Laterality: N/A;  . INSERTION OF DIALYSIS CATHETER Right 07/11/2014   Procedure: INSERTION OF DIALYSIS CATHETER IN RIGHT INTERNAL JUGULAR ;  Surgeon: Mal Misty, MD;  Location: [place];  Service: Vascular;  Laterality: Right;  . LAPAROSCOPIC NEPHRECTOMY Right 07/25/2014   Procedure: RIGHT LAPAROSCOPIC RADICAL NEPHRECTOMY ;  Surgeon: Ardis Hughs, MD;  Location: [facility];  Service: Urology;  Laterality: Right;  . PATCH ANGIOPLASTY Right 07/11/2014   Procedure: PATCH ANGIOPLASTY OF RIGHT RADIAL ARTERY USING CEPHALIC VEIN.;  Surgeon: Mal Misty, MD;  Location: [place];  Service: Vascular;  Laterality: Right;  . THROMBECTOMY W/ EMBOLECTOMY Right 07/11/2014   Procedure:  THROMBECTOMY OF RIGHT RADIAL ARTERY  ;  Surgeon: Mal Misty, MD;  Location: [place];  Service: Vascular;  Laterality: Right;     OB History   None      Home Medications    Prior to Admission medications   Medication Sig Start Date End Date Taking? Authorizing Provider  acetaminophen (TYLENOL) 500 MG tablet Take 1,000 mg by mouth every 6 (six) hours as needed for moderate pain or headache.   Yes [provider]  amLODipine (NORVASC) 10 MG tablet Take 1 tablet (10 mg total) by mouth daily. 02/01/17  Yes Love, Ivan Anchors, PA-C  aspirin EC 81 MG tablet Take 1 tablet (81 mg total) by mouth daily. 04/08/17  Yes Rosalin Hawking, MD  calcium acetate (PHOSLO) 667 MG capsule Take 2 capsules (1,334 mg total) by mouth 3 (three) times daily with meals. Patient taking differently: Take 1,334 mg by mouth See admin instructions. Take 2 capsules (1,334 mg) by mouth up to four times daily - with meals and snacks 02/01/17  Yes Love, Pamela S, PA-C  carvedilol (COREG) 25 MG tablet Take 25 mg by mouth 2 (two) times daily.  08/09/17  Yes [provider]  cinacalcet (SENSIPAR) 60 MG tablet Take 60 mg by mouth every Monday, Wednesday, and Friday.    Yes [provider]  gabapentin (NEURONTIN) 100 MG capsule Take 100 mg by mouth at bedtime. 12/21/17  Yes [provider]  heparin 1000 UNIT/ML injection Inject 5,000 Units into the vein as needed (fiber in her catheter). In 5L of fluid 09/14/17  Yes [provider]  levETIRAcetam (KEPPRA) 500 MG tablet Take 1 tablet (500 mg total) by mouth 2 (two) times daily. 02/01/17  Yes Love, Ivan Anchors, PA-C  levothyroxine (SYNTHROID, LEVOTHROID) 112 MCG tablet Take 112 mcg by mouth daily. 12/22/17  Yes [provider]  multivitamin (RENA-VIT) TABS tablet Take 1 tablet by mouth daily.   Yes [provider]  pantoprazole (PROTONIX) 40 MG tablet Take 1 tablet (40 mg total) by mouth 2 (two) times daily. 02/01/17  Yes Love, Ivan Anchors, PA-C   gentamicin cream (GARAMYCIN) 0.1 % Apply 1 application topically daily. 02/01/17   Bary Leriche, PA-C    Family History Family History  Problem Relation Age of Onset  . Throat cancer Mother        smoked  . Hypertension Father     Social History Social History   Tobacco Use  . Smoking status: Never Smoker  . Smokeless tobacco: Never Used  Substance Use Topics  . Alcohol use: Yes    Alcohol/week: 2.0 standard drinks    Types: 2 Glasses of wine per week    Comment: occ  . Drug use: No     Allergies   Lisinopril   Review of Systems Review of Systems  Constitutional: Negative for chills and fever.  HENT: Negative for congestion and rhinorrhea.   Eyes: Negative for redness and visual disturbance.  Respiratory: Positive for shortness of breath. Negative for wheezing.   Cardiovascular: Positive for leg swelling. Negative for chest pain and palpitations.  Gastrointestinal: Negative for nausea and vomiting.  Genitourinary: Negative for dysuria and urgency.  Musculoskeletal:  Negative for arthralgias and myalgias.  Skin: Negative for pallor and wound.  Neurological: Negative for dizziness and headaches.     Physical Exam Updated Vital Signs BP (!) 189/101   Pulse 90   Temp 98.6 F (37 C) (Oral)   Resp 14   Ht 5\' 5"  (1.651 m)   SpO2 92%   BMI 22.23 kg/m   Physical Exam  Constitutional: She is oriented to person, place, and time. She appears well-developed and well-nourished. No distress.  HENT:  Head: Normocephalic and atraumatic.  Eyes: Pupils are equal, round, and reactive to light. EOM are normal.  Neck: Normal range of motion. Neck supple.  Cardiovascular: Normal rate and regular rhythm. Exam reveals no gallop and no friction rub.  No murmur heard. Pulmonary/Chest: Effort normal. She has no wheezes. She has no rales.  Abdominal: Soft. She exhibits no distension. There is no tenderness.  Musculoskeletal: She exhibits edema (4+ to the thighs). She exhibits  no tenderness.  R av fistula with palpable thrill  Neurological: She is alert and oriented to person, place, and time.  Skin: Skin is warm and dry. She is not diaphoretic.  Psychiatric: She has a normal mood and affect. Her behavior is normal.  Nursing note and vitals reviewed.    ED Treatments / Results  Labs (all labs ordered are listed, but only abnormal results are displayed) Labs Reviewed  CBC - Abnormal; Notable for the following components:      Result Value   MCV 100.5 (*)    RDW 16.1 (*)    All other components within normal limits  COMPREHENSIVE METABOLIC PANEL - Abnormal; Notable for the following components:   Potassium 5.4 (*)    CO2 17 (*)    Glucose, Bld 104 (*)    BUN 101 (*)    Creatinine, Ser 19.77 (*)    Calcium 7.6 (*)    Total Protein 6.1 (*)    Albumin 3.0 (*)    GFR calc non Af Amer 2 (*)    GFR calc Af Amer 2 (*)    Anion gap 24 (*)    All other components within normal limits  I-STAT CHEM 8, ED - Abnormal; Notable for the following components:   Potassium 5.4 (*)    BUN 104 (*)    Creatinine, Ser >18.00 (*)    Calcium, Ion 0.84 (*)    TCO2 21 (*)    All other components within normal limits    EKG EKG Interpretation  Date/Time:  Wednesday March 09 2018 18:53:09 EDT Ventricular Rate:  78 PR Interval:    QRS Duration: 94 QT Interval:  401 QTC Calculation: 457 R Axis:   64 Text Interpretation:  Sinus rhythm No significant change since last tracing Confirmed by Deno Etienne [id number]) on 03/09/2018 7:04:53 PM   Radiology Dg Chest 2 View  Result Date: 03/09/2018 CLINICAL DATA:  Extremity swelling and dyspnea EXAM: CHEST - 2 VIEW COMPARISON:  12/04/2017 FINDINGS: AP upright view of the chest. Pulmonary vascular redistribution, cardiomegaly and small bilateral pleural effusions, left greater than right are identified consistent with CHF. Aortic atherosclerosis without aneurysmal dilatation is noted at the arch. No acute fracture nor aggressive  osseous lesions. IMPRESSION: Cardiomegaly with mild CHF and small posterior left greater than right pleural effusions. Electronically Signed   By: Ashley Royalty M.D.   On: 03/09/2018 20:35    Procedures Procedures (including critical care time)  Medications Ordered in ED Medications - No data to display  Initial Impression / Assessment and Plan / ED Course  I have reviewed the triage vital signs and the nursing notes.  Pertinent labs & imaging results that were available during my care of the patient were reviewed by me and considered in my medical decision making (see chart for details).     52 yo F with a chief complaint of worsening edema.  Is been going on for the past couple weeks.  Patient gets peritoneal dialysis at home and they have been concerned that they are no longer able to pull off any fluid.  They tried 3 different treatments today including a 4 hours well.  They talked with her nephrologist who suggested they come to the ED for admission.  Patient has a mildly elevated potassium on the i-STAT at 5.4.  EKG without significant changes to her T waves.  Will discuss with nephrology.  I discussed the case with Dr. Joelyn Oms.  He recommended the patient come into the hospital and get her dialysis catheter placed and started on dialysis.  Will discuss with the hospitalist.  The patients results and plan were reviewed and discussed.   Any x-rays performed were independently reviewed by myself.   Differential diagnosis were considered with the presenting HPI.  Medications - No data to display  Vitals:   03/09/18 2112 03/09/18 2115 03/09/18 2130 03/09/18 2145  BP: (!) 193/104 (!) 196/101 (!) 194/104 (!) 189/101  Pulse: 89 89 87 90  Resp: 15 13 13 14   Temp:      TempSrc:      SpO2: 92% 93% 93% 92%  Height:        Final diagnoses:  SOB (shortness of breath)  Peripheral edema    Admission/ observation were discussed with the admitting physician, patient and/or family and  they are comfortable with the plan.    .  Final Clinical Impressions(s) / ED Diagnoses   Final diagnoses:  SOB (shortness of breath)  Peripheral edema    ED Discharge Orders    None       Deno Etienne, DO 03/09/18 2309

## 2018-03-09 NOTE — ED Triage Notes (Signed)
Pt here from home with c/o sob and weight gain due to her PD not pulling the fluid off , pt thinks that she has gained around 50 lbs in 2 weeks

## 2018-03-09 NOTE — ED Notes (Signed)
Patient transported to X-ray 

## 2018-03-09 NOTE — Progress Notes (Signed)
Called ER RN for report. Room ready.  

## 2018-03-09 NOTE — ED Notes (Signed)
IV team at bedside 

## 2018-03-10 ENCOUNTER — Inpatient Hospital Stay (HOSPITAL_COMMUNITY): Payer: Medicare Other

## 2018-03-10 ENCOUNTER — Other Ambulatory Visit: Payer: Self-pay

## 2018-03-10 ENCOUNTER — Encounter (HOSPITAL_COMMUNITY): Payer: Self-pay | Admitting: *Deleted

## 2018-03-10 DIAGNOSIS — I132 Hypertensive heart and chronic kidney disease with heart failure and with stage 5 chronic kidney disease, or end stage renal disease: Principal | ICD-10-CM

## 2018-03-10 DIAGNOSIS — E875 Hyperkalemia: Secondary | ICD-10-CM

## 2018-03-10 DIAGNOSIS — R609 Edema, unspecified: Secondary | ICD-10-CM

## 2018-03-10 DIAGNOSIS — N2581 Secondary hyperparathyroidism of renal origin: Secondary | ICD-10-CM | POA: Diagnosis not present

## 2018-03-10 DIAGNOSIS — R011 Cardiac murmur, unspecified: Secondary | ICD-10-CM

## 2018-03-10 DIAGNOSIS — R079 Chest pain, unspecified: Secondary | ICD-10-CM

## 2018-03-10 DIAGNOSIS — Z8249 Family history of ischemic heart disease and other diseases of the circulatory system: Secondary | ICD-10-CM

## 2018-03-10 DIAGNOSIS — Z841 Family history of disorders of kidney and ureter: Secondary | ICD-10-CM

## 2018-03-10 DIAGNOSIS — Z79890 Hormone replacement therapy (postmenopausal): Secondary | ICD-10-CM

## 2018-03-10 DIAGNOSIS — N19 Unspecified kidney failure: Secondary | ICD-10-CM

## 2018-03-10 DIAGNOSIS — Z79899 Other long term (current) drug therapy: Secondary | ICD-10-CM | POA: Diagnosis not present

## 2018-03-10 DIAGNOSIS — M7989 Other specified soft tissue disorders: Secondary | ICD-10-CM

## 2018-03-10 DIAGNOSIS — R600 Localized edema: Secondary | ICD-10-CM

## 2018-03-10 DIAGNOSIS — Z992 Dependence on renal dialysis: Secondary | ICD-10-CM

## 2018-03-10 DIAGNOSIS — E039 Hypothyroidism, unspecified: Secondary | ICD-10-CM

## 2018-03-10 DIAGNOSIS — E8770 Fluid overload, unspecified: Secondary | ICD-10-CM

## 2018-03-10 DIAGNOSIS — R0602 Shortness of breath: Secondary | ICD-10-CM

## 2018-03-10 DIAGNOSIS — Z888 Allergy status to other drugs, medicaments and biological substances status: Secondary | ICD-10-CM

## 2018-03-10 DIAGNOSIS — D509 Iron deficiency anemia, unspecified: Secondary | ICD-10-CM | POA: Diagnosis not present

## 2018-03-10 DIAGNOSIS — Z5189 Encounter for other specified aftercare: Secondary | ICD-10-CM | POA: Diagnosis not present

## 2018-03-10 DIAGNOSIS — I69398 Other sequelae of cerebral infarction: Secondary | ICD-10-CM

## 2018-03-10 DIAGNOSIS — I69354 Hemiplegia and hemiparesis following cerebral infarction affecting left non-dominant side: Secondary | ICD-10-CM

## 2018-03-10 DIAGNOSIS — I773 Arterial fibromuscular dysplasia: Secondary | ICD-10-CM

## 2018-03-10 DIAGNOSIS — R17 Unspecified jaundice: Secondary | ICD-10-CM | POA: Diagnosis not present

## 2018-03-10 DIAGNOSIS — N186 End stage renal disease: Secondary | ICD-10-CM | POA: Diagnosis not present

## 2018-03-10 DIAGNOSIS — Z8674 Personal history of sudden cardiac arrest: Secondary | ICD-10-CM

## 2018-03-10 DIAGNOSIS — G629 Polyneuropathy, unspecified: Secondary | ICD-10-CM

## 2018-03-10 DIAGNOSIS — I509 Heart failure, unspecified: Secondary | ICD-10-CM

## 2018-03-10 DIAGNOSIS — R569 Unspecified convulsions: Secondary | ICD-10-CM

## 2018-03-10 DIAGNOSIS — Z7982 Long term (current) use of aspirin: Secondary | ICD-10-CM

## 2018-03-10 HISTORY — PX: IR FLUORO GUIDE CV LINE LEFT: IMG2282

## 2018-03-10 HISTORY — PX: IR US GUIDE VASC ACCESS LEFT: IMG2389

## 2018-03-10 LAB — CBC
HEMATOCRIT: 39.9 % (ref 36.0–46.0)
HEMOGLOBIN: 12.4 g/dL (ref 12.0–15.0)
MCH: 30 pg (ref 26.0–34.0)
MCHC: 31.1 g/dL (ref 30.0–36.0)
MCV: 96.4 fL (ref 78.0–100.0)
Platelets: 241 10*3/uL (ref 150–400)
RBC: 4.14 MIL/uL (ref 3.87–5.11)
RDW: 15.9 % — ABNORMAL HIGH (ref 11.5–15.5)
WBC: 7.9 10*3/uL (ref 4.0–10.5)

## 2018-03-10 LAB — BASIC METABOLIC PANEL
Anion gap: 24 — ABNORMAL HIGH (ref 5–15)
BUN: 102 mg/dL — AB (ref 6–20)
CO2: 17 mmol/L — AB (ref 22–32)
Calcium: 7.7 mg/dL — ABNORMAL LOW (ref 8.9–10.3)
Chloride: 105 mmol/L (ref 98–111)
Creatinine, Ser: 19.28 mg/dL — ABNORMAL HIGH (ref 0.44–1.00)
GFR calc Af Amer: 2 mL/min — ABNORMAL LOW (ref >60)
GFR, EST NON AFRICAN AMERICAN: 2 mL/min — AB (ref >60)
GLUCOSE: 93 mg/dL (ref 70–99)
POTASSIUM: 4.4 mmol/L (ref 3.5–5.1)
Sodium: 146 mmol/L — ABNORMAL HIGH (ref 135–145)

## 2018-03-10 LAB — PROTIME-INR
INR: 1.17
Prothrombin Time: 14.8 seconds (ref 11.4–15.2)

## 2018-03-10 MED ORDER — HEPARIN SODIUM (PORCINE) 1000 UNIT/ML IJ SOLN
INTRAMUSCULAR | Status: AC
  Filled 2018-03-10: qty 1

## 2018-03-10 MED ORDER — CALCIUM ACETATE (PHOS BINDER) 667 MG PO CAPS: 1334.0000 mg | ORAL_CAPSULE | Freq: Every day | ORAL | Status: DC | PRN

## 2018-03-10 MED ORDER — SODIUM CHLORIDE 0.9% FLUSH: 10.0000 mL | INTRAVENOUS | Status: DC | PRN

## 2018-03-10 MED ORDER — HEPARIN SODIUM (PORCINE) 5000 UNIT/ML IJ SOLN
5000.0000 [IU] | Freq: Three times a day (TID) | INTRAMUSCULAR | Status: DC
  Administered 2018-03-11 – 2018-03-12 (×4): 5000 [IU] via SUBCUTANEOUS
  Filled 2018-03-10 (×3): qty 1

## 2018-03-10 MED ORDER — LIDOCAINE HCL 1 % IJ SOLN
INTRAMUSCULAR | Status: DC | PRN
  Administered 2018-03-10: 5 mL

## 2018-03-10 MED ORDER — CALCITRIOL 0.25 MCG PO CAPS: 0.2500 ug | ORAL_CAPSULE | ORAL | Status: DC

## 2018-03-10 MED ORDER — CEFAZOLIN SODIUM-DEXTROSE 2-4 GM/100ML-% IV SOLN: 2.0000 g | INTRAVENOUS | Status: AC

## 2018-03-10 MED ORDER — LIDOCAINE HCL 1 % IJ SOLN
INTRAMUSCULAR | Status: AC
  Filled 2018-03-10: qty 20

## 2018-03-10 MED ORDER — HYDRALAZINE HCL 20 MG/ML IJ SOLN
5.0000 mg | Freq: Once | INTRAMUSCULAR | Status: AC
  Administered 2018-03-10: 5 mg via INTRAVENOUS

## 2018-03-10 MED ORDER — SODIUM POLYSTYRENE SULFONATE 15 GM/60ML PO SUSP
30.0000 g | Freq: Once | ORAL | Status: AC
  Administered 2018-03-10: 30 g via ORAL
  Filled 2018-03-10: qty 120

## 2018-03-10 NOTE — Evaluation (Signed)
Occupational Therapy Evaluation Patient Details Name: Terry Juarez MRN: [id number] DOB: May 22, 1966 Today's Date: 03/10/2018    History of Present Illness Pt is a 52 y.o. F with significant PMh of ESRD on PD, hemorrhagic CVA with residual seizures on Keppra and LUE weakness, hypothryoidism, and hypertension who presents with leg swelling and dyspnea for the past 3 weeks.   Clinical Impression   This 52 yo female admitted with above presents to acute OT with decreased balance, decreased mobility, non functional use of LUE, increased BP and CP all affecting pt's safety and independence with basic ADLs. She will benefit from acute OT with follow up OPOT post D/C. Pt with BP 196/103 HR 91 and c/o CP--made RN aware of BP and when she came to room CP. Pt from recliner to chair only during this session due to these issues. Will continue to follow.    Follow Up Recommendations  Outpatient OT;Supervision/Assistance - 24 hour    Equipment Recommendations  None recommended by OT       Precautions / Restrictions Precautions Precautions: Fall Precaution Comments: watch BP  Restrictions Weight Bearing Restrictions: No      Mobility Bed Mobility             General bed mobility comments: Pt up in recliner upon arrival  Transfers Overall transfer level: Needs assistance Equipment used: None Transfers: Sit to/from Stand Sit to Stand: Min assist         General transfer comment: quad cane for pivot    Balance Overall balance assessment: Mild deficits observed, not formally tested                                         ADL either performed or assessed with clinical judgement   ADL Overall ADL's : Needs assistance/impaired Eating/Feeding: Modified independent;Sitting   Grooming: Set up;Sitting   Upper Body Bathing: Minimal assistance;Sitting   Lower Body Bathing: Minimal assistance;Sit to/from stand   Upper Body Dressing : Set up;Sitting   Lower Body  Dressing: Minimal assistance;Sit to/from stand   Toilet Transfer: Minimal Production assistant, radio Details (indicate cue type and reason): quad cane Toileting- Clothing Manipulation and Hygiene: Minimal assistance;Sit to/from stand               Vision Patient Visual Report: No change from baseline              Pertinent Vitals/Pain Pain Assessment: Faces Faces Pain Scale: Hurts even more Pain Location: mid chest Pain Descriptors / Indicators: Discomfort(sharp at times) Pain Intervention(s): Monitored during session;Repositioned     Hand Dominance Right   Extremity/Trunk Assessment Upper Extremity Assessment Upper Extremity Assessment: LUE deficits/detail LUE Deficits / Details: history of CVA in 2018 with residual LUE deficits. Patient with left shoulder subluxation. PROM WFL. MMT: shoulder flexion 0/5, elbow flexion/extension 1/5, weak handgrip without ability to extend fingers LUE Sensation: WNL         Communication Communication Communication: No difficulties   Cognition Arousal/Alertness: Awake/alert Behavior During Therapy: WFL for tasks assessed/performed Overall Cognitive Status: Within Functional Limits for tasks assessed                                     General Comments  LUE/LLE edema            Home Living  Family/patient expects to be discharged to:: Private residence Living Arrangements: Parent;Other (Comment)(father and niece) Available Help at Discharge: Family;Available 24 hours/day Type of Home: House Home Access: Stairs to enter [employer] of Steps: 3 Entrance Stairs-Rails: Right;Left Home Layout: One level     Bathroom Shower/Tub: Occupational psychologist: Standard     Home Equipment: Cane - quad;Shower seat;Wheelchair - manual          Prior Functioning/Environment Level of Independence: Independent with assistive device(s)        Comments: uses quad cane for  mobility, drives, community ambulator, indep with ADL's. Niece occasionally assists with IADL's i.e. cooking, cleaning        OT Problem List: Decreased strength;Decreased range of motion;Impaired balance (sitting and/or standing);Pain;Impaired UE functional use      OT Treatment/Interventions: Self-care/ADL training;Balance training;Therapeutic activities;DME and/or AE instruction;Patient/family education    OT Goals(Current goals can be found in the care plan section) Acute Rehab OT Goals Patient Stated Goal: go to therapy OT Goal Formulation: With patient Time For Goal Achievement: 03/24/18 Potential to Achieve Goals: Good  OT Frequency: Min 2X/week              AM-PAC PT "6 Clicks" Daily Activity     Outcome Measure Help from another person eating meals?: None Help from another person taking care of personal grooming?: A Little Help from another person toileting, which includes using toliet, bedpan, or urinal?: A Little Help from another person bathing (including washing, rinsing, drying)?: A Little Help from another person to put on and taking off regular upper body clothing?: A Little Help from another person to put on and taking off regular lower body clothing?: A Little 6 Click Score: 19   End of Session Equipment Utilized During Treatment: (quad cane) Nurse Communication: (BP up and pt c/o CP--RN in room when I left)  Activity Tolerance: Patient tolerated treatment well Patient left: in bed;with call bell/phone within reach;with nursing/sitter in room  OT Visit Diagnosis: Unsteadiness on feet (R26.81);Other abnormalities of gait and mobility (R26.89);Hemiplegia and hemiparesis Hemiplegia - Right/Left: Left Hemiplegia - dominant/non-dominant: Non-Dominant Hemiplegia - caused by: Cerebral infarction                Time: 6415-8309 OT Time Calculation (min): 22 min Charges:  OT General Charges $OT Visit: 1 Visit OT Evaluation $OT Eval Moderate Complexity: 1  Mod  [place], OTR/L Acute [facility] [phone] Office [phone]

## 2018-03-10 NOTE — Evaluation (Addendum)
Physical Therapy Evaluation Patient Details Name: Terry Juarez MRN: [id number] DOB: 12-17-1965 Today's Date: 03/10/2018   History of Present Illness  Pt is a 52 y.o. F with significant PMh of ESRD on PD, hemorrhagic CVA with residual seizures on Keppra and LUE weakness, hypothryoidism, and hypertension who presents with leg swelling and dyspnea for the past 3 weeks.  Clinical Impression  Patient admitted with above diagnosis. Has residual left sided upper and lower extremity weakness from previous CVA. Prior to admission, patient ambulating with quad cane, independent with ADL's, and occasionally uses a left AFO. Currently, patient agreeable to ambulate from bed to chair, requiring min guard assist for balance. Noted decreased left foot clearance. BP 172/93. Would benefit from OPPT services to maximize functional independence, address gait abnormalities, functional strengthening, and return to PLOF. Will follow acutely.    Follow Up Recommendations Outpatient PT;Supervision for mobility/OOB (politely declining HHPT)    Equipment Recommendations  None recommended by PT    Recommendations for Other Services       Precautions / Restrictions Precautions Precautions: Fall Precaution Comments: watch BP Restrictions Weight Bearing Restrictions: No      Mobility  Bed Mobility Overal bed mobility: Modified Independent             General bed mobility comments: increased time  Transfers Overall transfer level: Needs assistance Equipment used: None Transfers: Sit to/from Stand Sit to Stand: Min guard            Ambulation/Gait Ambulation/Gait assistance: Min guard   Assistive device: Quad cane Gait Pattern/deviations: Step-through pattern;Decreased step length - left Gait velocity: decr   General Gait Details: Decreased left foot clearance noted during swing phase. Patient taking pivotal steps from bed to recliner  Stairs            Wheelchair Mobility     Modified Rankin (Stroke Patients Only) Modified Rankin (Stroke Patients Only) Pre-Morbid Rankin Score: Slight disability Modified Rankin: Moderately severe disability     Balance Overall balance assessment: Mild deficits observed, not formally tested                                           Pertinent Vitals/Pain Pain Assessment: Faces Faces Pain Scale: Hurts a little bit Pain Location: IV site Pain Descriptors / Indicators: Discomfort Pain Intervention(s): Monitored during session    Home Living Family/patient expects to be discharged to:: Private residence Living Arrangements: Parent;Other (Comment)(father, niece) Available Help at Discharge: Family Type of Home: House Home Access: Stairs to enter Entrance Stairs-Rails: Psychiatric nurse of Steps: 3 Home Layout: One level Home Equipment: Cane - quad;Shower seat;Wheelchair - manual      Prior Function Level of Independence: Independent with assistive device(s)         Comments: uses quad cane for mobility, drives, community ambulator, indep with ADL's. Niece occasionally assists with IADL's i.e. cooking.      Hand Dominance   Dominant Hand: Right    Extremity/Trunk Assessment   Upper Extremity Assessment Upper Extremity Assessment: LUE deficits/detail LUE Deficits / Details: history of CVA in 2018 with residual LUE deficits. Patient with left shoulder subluxation. PROM WFL. MMT: shoulder flexion 0/5, elbow flexion/extension 1/5, weak handgrip LUE Sensation: WNL    Lower Extremity Assessment Lower Extremity Assessment: LLE deficits/detail LLE Deficits / Details: history of residual LLE deficits from CVA. Ankle dorsiflexion/plantarflexion 2/5. Able to perform limited  SLR       Communication   Communication: No difficulties  Cognition Arousal/Alertness: Awake/alert Behavior During Therapy: WFL for tasks assessed/performed Overall Cognitive Status: Within Functional  Limits for tasks assessed                                        General Comments General comments (skin integrity, edema, etc.): LUE/LLE edema    Exercises     Assessment/Plan    PT Assessment Patient needs continued PT services  PT Problem List Decreased strength;Decreased range of motion;Decreased activity tolerance;Decreased balance;Decreased mobility;Decreased coordination       PT Treatment Interventions DME instruction;Gait training;Stair training;Functional mobility training;Therapeutic activities;Therapeutic exercise;Balance training;Neuromuscular re-education;Patient/family education    PT Goals (Current goals can be found in the Care Plan section)  Acute Rehab PT Goals Patient Stated Goal: "go to outpatient." PT Goal Formulation: With patient Time For Goal Achievement: 03/24/18 Potential to Achieve Goals: Good    Frequency Min 4X/week   Barriers to discharge        Co-evaluation               AM-PAC PT "6 Clicks" Daily Activity  Outcome Measure Difficulty turning over in bed (including adjusting bedclothes, sheets and blankets)?: None Difficulty moving from lying on back to sitting on the side of the bed? : A Little Difficulty sitting down on and standing up from a chair with arms (e.g., wheelchair, bedside commode, etc,.)?: A Little Help needed moving to and from a bed to chair (including a wheelchair)?: A Little Help needed walking in hospital room?: A Little Help needed climbing 3-5 steps with a railing? : A Lot 6 Click Score: 18    End of Session Equipment Utilized During Treatment: Gait belt Activity Tolerance: Patient tolerated treatment well Patient left: in chair;with call bell/phone within reach Nurse Communication: Mobility status PT Visit Diagnosis: Other abnormalities of gait and mobility (R26.89);Difficulty in walking, not elsewhere classified (R26.2)    Time: 8003-4917 PT Time Calculation (min) (ACUTE ONLY): 17  min   Charges:   PT Evaluation $PT Eval Moderate Complexity: [name], [place], DPT Acute Rehabilitation Services Pager [phone] Office [phone]    Willy Eddy 03/10/2018, 10:18 AM

## 2018-03-10 NOTE — Progress Notes (Signed)
Left upper extremity venous duplex has been completed. Negative for DVT.  03/10/18 11:51 AM Terry Juarez RVT

## 2018-03-10 NOTE — Progress Notes (Signed)
Assessed pt's arm's for IV placement. Right arm is restricted d/t fistula, left arm is swollen and patient has no movement/feeling d/t previous stroke. Left leg also flaccid and swollen, right leg also swollen and no suitable site noted for access. Dr. Tamsen Snider notified of need for central access. Wyvonnia Lora, RN also aware.

## 2018-03-10 NOTE — Progress Notes (Addendum)
   Subjective:  Terry Juarez sitting in chair this morning on exam. Reports no urine output on lasix. Minimal sob and continued arm pain.   Objective:  Vital signs in last 24 hours: Vitals:   03/10/18 0822 03/10/18 0825 03/10/18 1059 03/10/18 1230  BP: (!) 200/96  (!) 196/103 (!) 196/100  Pulse: 89  91 92  Resp: 18   18  Temp:  98.2 F (36.8 C)  98.3 F (36.8 C)  TempSrc:  Oral  Oral  SpO2: 92%   95%  Weight:      Height:       Weight change:   Intake/Output Summary (Last 24 hours) at 03/10/2018 1354 Last data filed at 03/10/2018 1300 Gross per 24 hour  Intake 0 ml  Output 0 ml  Net 0 ml   Physical Exam General: Sitting in chair, NAD CV: RRR, S3 Pulm: Bibasilar crackles Ext: +AVF in RUE with good thrill. LUE w/ significant edema and 2/5 strength .BLE 2+ pitting edema    Assessment/Plan:    Active Problems:   ESRD on peritoneal dialysis (HCC)   Seizure disorder (HCC)   Hypothyroidism   Left spastic hemiparesis (HCC)   Volume overload   Peripheral edema   SOB (shortness of breath)   Uremia   [name] is a 52 yo female with ESRD on PD, hemorrhagic CVA with residual seizures on KEppra and LUE weakness, hypothyrodism , who presents with fluid overload in setting of PD failure.   Volume overload 2/2 ESRD w/ PD failure Pt presented with dyspnea, orthopnea, and lower extremity swelling, left upper extremity swelling, and a 50lb weight gain in 3 weeks in the setting of ineffective PD. - Continue home phoslo and cinacalcet - Daily weights, strict I/O's - Schedule w/ IR for tunneled cath placement. IR will also attempt peripheral IV access.  If not successful will place central line  - Nephrology following, plan for HD today - transferring care to [facility] 9/6  Chest pain Paged by nursing this morning. Patient complaining of chest pain that came on with lying down and eased off when she sat up. The pain did not radiate. Denies SOB or any other acute symptoms. Obtained an  EKG.  EKG with NSR, no ST changes noted or changes from prior. Likely due to volume overload. Reviewed tele , normal sinus rythm - HD today - on telemetry   HTN Pt on amlodipine and coreg at home. Systolic in the 937'T this morning. Expect improvement with dialysis.  - continue home amlodipine and coreg -hydralazine 10 mg IV q6hrs for sBP >180  Hx of CVA Fibromuscular dysplasia of bilateral ICAs - Continue home aspirin 81 mg  - Treat HTN as above - Being followed by PT/OT. Outpatient PT recommended.   Seizure disorder - Continue home Keppra 500 mg BID  Hypothyroidism - Continue home synthroid 112 mcg QD  Peripheral Neuropathy - Continue home gabapentin  FEN:no IV fluids, NPO diet, replace electrolytes as needed DVT ppx: [place] heparin Code status:FULL code   LOS: 1 day   Terry Juarez, Medical Student 03/10/2018, 1:54 PM   Attestation for Student Documentation:  I personally was present and performed or re-performed the history, physical exam and medical decision-making activities of this service and have verified that the service and findings are accurately documented in the student's note.  Terry Juarez [place], DO 03/10/2018, 3:23 PM

## 2018-03-10 NOTE — Progress Notes (Signed)
IMTS to transfer care to [facility] service on 9/6 at [place] with Dr. Marthenia Rolling

## 2018-03-10 NOTE — Consult Note (Addendum)
[facility] Renal Consultation Note  Indication for Consultation:  Management of ESRD/hemodialysis; anemia, hypertension/volume and secondary hyperparathyroidism  HPI: Terry Juarez is a 52 y.o. female with ESRD on CCPD( followed by Dr. [name]) with . PMH: HTN, S/P CVA ( R posterior frontal lobe hemorrhage) with L side deficits and seizures  6/16-7/02/18 admit, and  R RCC with R Nephrectomy/ Hypothyroidism,AOCD, SHPT       Presents with cos progressive lower extremely swelling and some Left upper extrem swelling , orthopnea  and  sob with peritoneal dialysis  not removing fluid. Also trial of Bid 23m Lasix failed. She reports missing only  One day of CCPD. Noted her edw is 50.5 kg  And admit wt is 74 kg  With CXR = mild chf /biat pl. Effusion L>R . BUN 101. Scr 19.77 K 5.4 CO2 17  . BP 181/101.  Noted with prior CVA admit she had Cardiac arrest with attempting to use her AVF ,she refuses to use it  and will allow a perm cath placement  by IR . Currently in room sitting upright in chair, only cos  Being hungry.      Past Medical History:  Diagnosis Date  . Anemia   . Bruises easily   . [place]   [place]   . Hyperlipidemia   . Hypertension   . Renal disorder    rt renal mass / < functioning of left kidney - being prepared for possible dialysis  . Right renal mass   . Stroke ([facility]     Past Surgical History:  Procedure Laterality Date  . AV FISTULA PLACEMENT Right 07/11/2014   Procedure: ARTERIOVENOUS (AV) FISTULA CREATION RIGHT ARM BRACHIO-CEPHALIC WITH ATTEMPTED RADIO-CEPHALIC (AV) FISTULA;  Surgeon: JMal Misty MD;  [place]  Service: Vascular;  Laterality: Right;  . COLONOSCOPY WITH PROPOFOL N/A 09/04/2016   Procedure: COLONOSCOPY WITH PROPOFOL;  Surgeon: PCarol Ada MD;  [facility];  Service: Endoscopy;  Laterality: N/A;  . ECTOPIC PREGNANCY SURGERY  1987  . ESOPHAGOGASTRODUODENOSCOPY N/A 07/30/2016   Procedure: ESOPHAGOGASTRODUODENOSCOPY (EGD);  Surgeon: PCarol Ada MD;  [facility];  Service: Endoscopy;  Laterality: N/A;  Bedside  . ESOPHAGOGASTRODUODENOSCOPY (EGD) WITH PROPOFOL N/A 09/04/2016   Procedure: ESOPHAGOGASTRODUODENOSCOPY (EGD) WITH PROPOFOL;  Surgeon: PCarol Ada MD;  [facility];  Service: Endoscopy;  Laterality: N/A;  . ESOPHAGOGASTRODUODENOSCOPY (EGD) WITH PROPOFOL N/A 08/27/2017   Procedure: ESOPHAGOGASTRODUODENOSCOPY (EGD) WITH PROPOFOL;  Surgeon: HCarol Ada MD;  [facility];  Service: Endoscopy;  Laterality: N/A;  . INSERTION OF DIALYSIS CATHETER Right 07/11/2014   Procedure: INSERTION OF DIALYSIS CATHETER IN RIGHT INTERNAL JUGULAR ;  Surgeon: JMal Misty MD;  [place]  Service: Vascular;  Laterality: Right;  . LAPAROSCOPIC NEPHRECTOMY Right 07/25/2014   Procedure: RIGHT LAPAROSCOPIC RADICAL NEPHRECTOMY ;  Surgeon: BArdis Hughs MD;  [facility];  Service: Urology;  Laterality: Right;  . PATCH ANGIOPLASTY Right 07/11/2014   Procedure: PATCH ANGIOPLASTY OF RIGHT RADIAL ARTERY USING CEPHALIC VEIN.;  Surgeon: JMal Misty MD;  [place]  Service: Vascular;  Laterality: Right;  . THROMBECTOMY W/ EMBOLECTOMY Right 07/11/2014   Procedure: THROMBECTOMY OF RIGHT RADIAL ARTERY  ;  Surgeon: JMal Misty MD;  [facility];  Service: Vascular;  Laterality: Right;      Family History  Problem Relation Age of Onset  . Throat cancer Mother        smoked  .  Hypertension Father       reports that she has never smoked. She has never used smokeless tobacco. She reports that she drinks about 2.0 standard drinks of alcohol per week. She reports that she does not use drugs.   Allergies  Allergen Reactions  . Lisinopril Cough    Prior to Admission medications   Medication Sig Start Date End Date Taking? Authorizing Provider  acetaminophen (TYLENOL) 500 MG tablet Take 1,000 mg by mouth every 6 (six) hours as needed  for moderate pain or headache.   Yes [provider]  amLODipine (NORVASC) 10 MG tablet Take 1 tablet (10 mg total) by mouth daily. 02/01/17  Yes Love, Ivan Anchors, PA-C  aspirin EC 81 MG tablet Take 1 tablet (81 mg total) by mouth daily. 04/08/17  Yes Rosalin Hawking, MD  calcium acetate (PHOSLO) 667 MG capsule Take 2 capsules (1,334 mg total) by mouth 3 (three) times daily with meals. Patient taking differently: Take 1,334 mg by mouth See admin instructions. Take 2 capsules (1,334 mg) by mouth up to four times daily - with meals and snacks 02/01/17  Yes Love, Pamela S, PA-C  carvedilol (COREG) 25 MG tablet Take 25 mg by mouth 2 (two) times daily.  08/09/17  Yes [provider]  cinacalcet (SENSIPAR) 60 MG tablet Take 60 mg by mouth every Monday, Wednesday, and Friday.    Yes [provider]  furosemide (LASIX) 80 MG tablet Take 80 mg by mouth 2 (two) times daily. 02/28/18  Yes [provider]  gabapentin (NEURONTIN) 100 MG capsule Take 100 mg by mouth at bedtime. 12/21/17  Yes [provider]  gentamicin cream (GARAMYCIN) 0.1 % Apply 1 application topically daily. 02/01/17  Yes Love, Ivan Anchors, PA-C  heparin 1000 UNIT/ML injection Inject 5,000 Units into the vein as needed (fiber in her catheter). In 5L of fluid 09/14/17  Yes [provider]  levETIRAcetam (KEPPRA) 500 MG tablet Take 1 tablet (500 mg total) by mouth 2 (two) times daily. 02/01/17  Yes Love, Ivan Anchors, PA-C  levothyroxine (SYNTHROID, LEVOTHROID) 112 MCG tablet Take 112 mcg by mouth daily. 12/22/17  Yes [provider]  multivitamin (RENA-VIT) TABS tablet Take 1 tablet by mouth daily.   Yes [provider]  pantoprazole (PROTONIX) 40 MG tablet Take 1 tablet (40 mg total) by mouth 2 (two) times daily. 02/01/17  Yes Love, Ivan Anchors, PA-C    XBD:[id number] **OR** acetaminophen, calcium acetate, hydrALAZINE  Results for orders placed or performed during the hospital encounter of  03/09/18 (from the past 48 hour(s))  CBC     Status: Abnormal   Collection Time: 03/09/18  8:12 PM  Result Value Ref Range   WBC 8.4 4.0 - 10.5 K/uL   RBC 4.32 3.87 - 5.11 MIL/uL   Hemoglobin 13.3 12.0 - 15.0 g/dL   HCT 43.4 36.0 - 46.0 %   MCV 100.5 (H) 78.0 - 100.0 fL   MCH 30.8 26.0 - 34.0 pg   MCHC 30.6 30.0 - 36.0 g/dL   RDW 16.1 (H) 11.5 - 15.5 %   Platelets 252 150 - 400 K/uL    Comment: Performed at [facility] [address] [place], [place] [address]  Comprehensive metabolic panel     Status: Abnormal   Collection Time: 03/09/18  8:12 PM  Result Value Ref Range   Sodium 144 135 - 145 mmol/L   Potassium 5.4 (H) 3.5 - 5.1 mmol/L   Chloride 103 98 - 111  mmol/L   CO2 17 (L) 22 - 32 mmol/L   Glucose, Bld 104 (H) 70 - 99 mg/dL   BUN 101 (H) 6 - 20 mg/dL   Creatinine, Ser 19.77 (H) 0.44 - 1.00 mg/dL   Calcium 7.6 (L) 8.9 - 10.3 mg/dL   Total Protein 6.1 (L) 6.5 - 8.1 g/dL   Albumin 3.0 (L) 3.5 - 5.0 g/dL   AST 23 15 - 41 U/L   ALT 13 0 - 44 U/L   Alkaline Phosphatase 57 38 - 126 U/L   Total Bilirubin 0.9 0.3 - 1.2 mg/dL   GFR calc non Af Amer 2 (L) >60 mL/min   GFR calc Af Amer 2 (L) >60 mL/min    Comment: (NOTE) The eGFR has been calculated using the CKD EPI equation. This calculation has not been validated in all clinical situations. eGFR's persistently <60 mL/min signify possible Chronic Kidney Disease.    Anion gap 24 (H) 5 - 15    Comment: Performed at [facility], [place] [address], [place], [place] [address]  I-Stat Chem 8, ED     Status: Abnormal   Collection Time: 03/09/18  8:24 PM  Result Value Ref Range   Sodium 139 135 - 145 mmol/L   Potassium 5.4 (H) 3.5 - 5.1 mmol/L   Chloride 109 98 - 111 mmol/L   BUN 104 (H) 6 - 20 mg/dL   Creatinine, Ser >18.00 (H) 0.44 - 1.00 mg/dL   Glucose, Bld 97 70 - 99 mg/dL   Calcium, Ion 0.84 (LL) 1.15 - 1.40 mmol/L   TCO2 21 (L) 22 - 32 mmol/L   Hemoglobin 14.3 12.0 - 15.0 g/dL   HCT 42.0 36.0 - 46.0 %    Comment NOTIFIED PHYSICIAN   Basic metabolic panel     Status: Abnormal   Collection Time: 03/10/18  4:15 AM  Result Value Ref Range   Sodium 146 (H) 135 - 145 mmol/L    Comment: DELTA CHECK NOTED   Potassium 4.4 3.5 - 5.1 mmol/L    Comment: NO VISIBLE HEMOLYSIS   Chloride 105 98 - 111 mmol/L   CO2 17 (L) 22 - 32 mmol/L   Glucose, Bld 93 70 - 99 mg/dL   BUN 102 (H) 6 - 20 mg/dL   Creatinine, Ser 19.28 (H) 0.44 - 1.00 mg/dL   Calcium 7.7 (L) 8.9 - 10.3 mg/dL   GFR calc non Af Amer 2 (L) >60 mL/min   GFR calc Af Amer 2 (L) >60 mL/min    Comment: (NOTE) The eGFR has been calculated using the CKD EPI equation. This calculation has not been validated in all clinical situations. eGFR's persistently <60 mL/min signify possible Chronic Kidney Disease.    Anion gap 24 (H) 5 - 15    Comment: Performed at [facility], [place] [address], [place],  [address]  CBC     Status: Abnormal   Collection Time: 03/10/18  4:15 AM  Result Value Ref Range   WBC 7.9 4.0 - 10.5 K/uL   RBC 4.14 3.87 - 5.11 MIL/uL   Hemoglobin 12.4 12.0 - 15.0 g/dL   HCT 39.9 36.0 - 46.0 %   MCV 96.4 78.0 - 100.0 fL   MCH 30.0 26.0 - 34.0 pg   MCHC 31.1 30.0 - 36.0 g/dL   RDW 15.9 (H) 11.5 - 15.5 %   Platelets 241 150 - 400 K/uL    Comment: Performed at [facility] [address],  [place], [place] [address]    ROS: see positives in hpi   Physical Exam: Vitals:   03/10/18 0822 03/10/18 0825  BP: (!) 200/96   Pulse: 89   Resp: 18   Temp:  98.2 F (36.8 C)  SpO2: 92%      General: alert  WD, WN , AAF , NAD , Calm ,siitng upright in Bedside chair  HEENT: facial edema noted, sclera not icteric , MMM Neck: supple, no JVD Heart: RRR, 1/6sem , no rug or galolop Lungs:  Some bibasilar rales ,nonlabored breathing  Abdomen: BS pos soft , NT, ND, PD Cath In place non tender and no dc noted  Extremities: Left upper extremity swelling , bipedal edema kness to feet 2-3+ Skin: Left shin abrasion mnor,  no pedal  Ulcers ,no overt rash Neuro: OX3, Left sides upper and lower extrtem weakness , R upper extrem Asterixis ,followed requests  Dialysis Access: R UA AVF  Pos . Bruit/ PD cath in place R lower abd quad   Dialysis Orders:  EDW 50.5 kg  Ca 2.5  was on CCPD 7x/wk  #6 exchanges ,2000 fill vol  dwell time 1.5 hr  on  Last fill vol 1039m  0 day exchanges  / Day dwell time 3hr     OP ESRD Labs  HGB 9.8  ( 02/07/18)  TFS 45%   NO ESA,or FE                                PTH 405  (02/07/18)   Pth 766 (01/21/18)    Assessment/Plan 1. Uremia/ volume overload with CCPD failure- [facility] ( first tx [facility] HD) will need consecutive   txs for volume  removal 2. ESRD - was on CCPDnow eityh failure to uf and Uremic transition to HD ,Clip to op unit  3. Hypertension/volume  - BP sec to volume , uf on hd ,on 3 bp meds , fu bp trend with hd  And sold be able to taper off some meds ,make Amlodipine hs  4. Anemia  - Currently hgb 12.4 no esa needs 5. Metabolic bone disease -  On sensipar mon, wed fri, ,Phoslo as binder,no vit d ,start Po Calcitriol 0.281m on MFW hd  with op pth elevated this year.   Corrected Ca 8.5   Follow up ca/phos trend in hosp. 6. Nutrition - renal diet , renal vit 7. HO CVA with Left sided weakness  -NO HEP HD /ambulates with cane, 8. HO Sizure disorder - on Keppra  9. HO hypothyroidism  - meds per admit   DaErnest HaberPA-C [id number]/11/2017, 9:38 AM

## 2018-03-10 NOTE — Progress Notes (Signed)
Patient B/P 204/100 gave hydralazine 10mg  MD is aware. Recheck B/P 181/99 patient will be going to hemo tonight report was given to HD nurse.

## 2018-03-10 NOTE — Consult Note (Signed)
Chief Complaint: Patient was seen in consultation today for renal failure  Referring Physician(s): Ernest Haber, PA  Supervising Physician: Markus Daft  Patient Status: [facility] - In-pt  History of Present Illness: Terry Juarez is a 52 y.o. female with past medical history of anemia, HLD, HTN, stroke, and ESRD on PD at home presents with fluid overload and suspected insufficient dialysis via PD. Patient does have a right upper arm fistula, however has coded twice while in use and requests HD via dialysis catheter.  IR consulted for tunneled dialysis catheter placement.   She has been NPO today.  Last dose SQ hep was at 6AM.   Past Medical History:  Diagnosis Date  . Anemia   . Bruises easily   . Dialysis patient ([place])   . ESRD on dialysis ([place])   . Hyperlipidemia   . Hypertension   . Renal disorder    rt renal mass / < functioning of left kidney - being prepared for possible dialysis  . Right renal mass   . Stroke [facility])     Past Surgical History:  Procedure Laterality Date  . AV FISTULA PLACEMENT Right 07/11/2014   Procedure: ARTERIOVENOUS (AV) FISTULA CREATION RIGHT ARM BRACHIO-CEPHALIC WITH ATTEMPTED RADIO-CEPHALIC (AV) FISTULA;  Surgeon: Mal Misty, MD;  Location: [place];  Service: Vascular;  Laterality: Right;  . COLONOSCOPY WITH PROPOFOL N/A 09/04/2016   Procedure: COLONOSCOPY WITH PROPOFOL;  Surgeon: Carol Ada, MD;  [facility];  Service: Endoscopy;  Laterality: N/A;  . ECTOPIC PREGNANCY SURGERY  1987  . ESOPHAGOGASTRODUODENOSCOPY N/A 07/30/2016   Procedure: ESOPHAGOGASTRODUODENOSCOPY (EGD);  Surgeon: Carol Ada, MD;  Location: [facility];  Service: Endoscopy;  Laterality: N/A;  Bedside  . ESOPHAGOGASTRODUODENOSCOPY (EGD) WITH PROPOFOL N/A 09/04/2016   Procedure: ESOPHAGOGASTRODUODENOSCOPY (EGD) WITH PROPOFOL;  Surgeon: Carol Ada, MD;  [facility];  Service: Endoscopy;  Laterality: N/A;  . ESOPHAGOGASTRODUODENOSCOPY (EGD) WITH PROPOFOL  N/A 08/27/2017   Procedure: ESOPHAGOGASTRODUODENOSCOPY (EGD) WITH PROPOFOL;  Surgeon: Carol Ada, MD;  [facility];  Service: Endoscopy;  Laterality: N/A;  . INSERTION OF DIALYSIS CATHETER Right 07/11/2014   Procedure: INSERTION OF DIALYSIS CATHETER IN RIGHT INTERNAL JUGULAR ;  Surgeon: Mal Misty, MD;  Location: [place];  Service: Vascular;  Laterality: Right;  . LAPAROSCOPIC NEPHRECTOMY Right 07/25/2014   Procedure: RIGHT LAPAROSCOPIC RADICAL NEPHRECTOMY ;  Surgeon: Ardis Hughs, MD;  [facility];  Service: Urology;  Laterality: Right;  . PATCH ANGIOPLASTY Right 07/11/2014   Procedure: PATCH ANGIOPLASTY OF RIGHT RADIAL ARTERY USING CEPHALIC VEIN.;  Surgeon: Mal Misty, MD;  Location: [place];  Service: Vascular;  Laterality: Right;  . THROMBECTOMY W/ EMBOLECTOMY Right 07/11/2014   Procedure: THROMBECTOMY OF RIGHT RADIAL ARTERY  ;  Surgeon: Mal Misty, MD;  Location: [place];  Service: Vascular;  Laterality: Right;    Allergies: Lisinopril  Medications: Prior to Admission medications   Medication Sig Start Date End Date Taking? Authorizing Provider  acetaminophen (TYLENOL) 500 MG tablet Take 1,000 mg by mouth every 6 (six) hours as needed for moderate pain or headache.   Yes [provider]  amLODipine (NORVASC) 10 MG tablet Take 1 tablet (10 mg total) by mouth daily. 02/01/17  Yes Love, Ivan Anchors, PA-C  aspirin EC 81 MG tablet Take 1 tablet (81 mg total) by mouth daily. 04/08/17  Yes Rosalin Hawking, MD  calcium acetate (PHOSLO) 667 MG capsule Take 2 capsules (1,334 mg total) by mouth 3 (three) times daily with meals.  Patient taking differently: Take 1,334 mg by mouth See admin instructions. Take 2 capsules (1,334 mg) by mouth up to four times daily - with meals and snacks 02/01/17  Yes Love, Pamela S, PA-C  carvedilol (COREG) 25 MG tablet Take 25 mg by mouth 2 (two) times daily.  08/09/17  Yes [provider]  cinacalcet (SENSIPAR) 60 MG tablet Take 60 mg by  mouth every Monday, Wednesday, and Friday.    Yes [provider]  furosemide (LASIX) 80 MG tablet Take 80 mg by mouth 2 (two) times daily. 02/28/18  Yes [provider]  gabapentin (NEURONTIN) 100 MG capsule Take 100 mg by mouth at bedtime. 12/21/17  Yes [provider]  gentamicin cream (GARAMYCIN) 0.1 % Apply 1 application topically daily. 02/01/17  Yes Love, Ivan Anchors, PA-C  heparin 1000 UNIT/ML injection Inject 5,000 Units into the vein as needed (fiber in her catheter). In 5L of fluid 09/14/17  Yes [provider]  levETIRAcetam (KEPPRA) 500 MG tablet Take 1 tablet (500 mg total) by mouth 2 (two) times daily. 02/01/17  Yes Love, Ivan Anchors, PA-C  levothyroxine (SYNTHROID, LEVOTHROID) 112 MCG tablet Take 112 mcg by mouth daily. 12/22/17  Yes [provider]  multivitamin (RENA-VIT) TABS tablet Take 1 tablet by mouth daily.   Yes [provider]  pantoprazole (PROTONIX) 40 MG tablet Take 1 tablet (40 mg total) by mouth 2 (two) times daily. 02/01/17  Yes Love, Ivan Anchors, PA-C     Family History  Problem Relation Age of Onset  . Throat cancer Mother        smoked  . Hypertension Father     Social History   Socioeconomic History  . Marital status: Widowed    Spouse name: Not on file  . Number of children: Not on file  . Years of education: Not on file  . Highest education level: Not on file  Occupational History  . Not on file  Social Needs  . Financial resource strain: Not on file  . Food insecurity:    Worry: Not on file    Inability: Not on file  . Transportation needs:    Medical: Not on file    Non-medical: Not on file  Tobacco Use  . Smoking status: Never Smoker  . Smokeless tobacco: Never Used  Substance and Sexual Activity  . Alcohol use: Yes    Alcohol/week: 2.0 standard drinks    Types: 2 Glasses of wine per week    Comment: occ  . Drug use: No  . Sexual activity: Yes  Lifestyle  . Physical activity:    Days per  week: Not on file    Minutes per session: Not on file  . Stress: Not on file  Relationships  . Social connections:    Talks on phone: Not on file    Gets together: Not on file    Attends religious service: Not on file    Active member of club or organization: Not on file    Attends meetings of clubs or organizations: Not on file    Relationship status: Not on file  Other Topics Concern  . Not on file  Social History Narrative  . Not on file     Review of Systems: A 12 point ROS discussed and pertinent positives are indicated in the HPI above.  All other systems are negative.  Review of Systems  Constitutional: Positive for unexpected weight change (edema). Negative for fatigue and fever.  Respiratory: Negative  for cough and shortness of breath.   Cardiovascular: Negative for chest pain.  Gastrointestinal: Negative for abdominal pain.  Genitourinary: Negative for dysuria and flank pain.  Musculoskeletal: Negative for back pain.  Psychiatric/Behavioral: Negative for behavioral problems and confusion.    Vital Signs: BP (!) 196/100 (BP Location: Left Arm)   Pulse 92   Temp 98.3 F (36.8 C) (Oral)   Resp 18   Ht 5\' 5"  (1.651 m)   Wt 162 lb 11.2 oz (73.8 kg)   SpO2 95%   BMI 27.07 kg/m   Physical Exam  Constitutional: She is oriented to person, place, and time. She appears well-developed.  Neck: Normal range of motion. Neck supple.  Scar from previous catheter placement on right chest.   Cardiovascular: Normal rate, regular rhythm and normal heart sounds. Exam reveals no gallop and no friction rub.  No murmur heard. Pulmonary/Chest: Effort normal and breath sounds normal. No respiratory distress.  Lymphadenopathy:    She has no cervical adenopathy.  Neurological: She is alert and oriented to person, place, and time.  Skin: Skin is warm and dry.  Psychiatric: She has a normal mood and affect. Her behavior is normal. Judgment and thought content normal.  Nursing note  and vitals reviewed.    MD Evaluation Airway: WNL Heart: WNL Abdomen: WNL Chest/ Lungs: WNL ASA  Classification: 3 Mallampati/Airway Score: One   Imaging: Dg Chest 2 View  Result Date: 03/09/2018 CLINICAL DATA:  Extremity swelling and dyspnea EXAM: CHEST - 2 VIEW COMPARISON:  12/04/2017 FINDINGS: AP upright view of the chest. Pulmonary vascular redistribution, cardiomegaly and small bilateral pleural effusions, left greater than right are identified consistent with CHF. Aortic atherosclerosis without aneurysmal dilatation is noted at the arch. No acute fracture nor aggressive osseous lesions. IMPRESSION: Cardiomegaly with mild CHF and small posterior left greater than right pleural effusions. Electronically Signed   By: Ashley Royalty M.D.   On: 03/09/2018 20:35    Labs:  CBC: Recent Labs    01/22/18 1817  01/24/18 0821 03/09/18 2012 03/09/18 2024 03/10/18 0415  WBC 10.4  --  11.5* 8.4  --  7.9  HGB 5.5*   < > 9.5* 13.3 14.3 12.4  HCT 18.0*   < > 29.2* 43.4 42.0 39.9  PLT 326  --  337 252  --  241   < > = values in this interval not displayed.    COAGS: Recent Labs    10/18/17 1612 01/22/18 1817 03/10/18 1132  INR 1.09 1.13 1.17    BMP: Recent Labs    01/23/18 0823 01/24/18 0821 03/09/18 2012 03/09/18 2024 03/10/18 0415  NA 140 138 144 139 146*  K 4.3 3.7 5.4* 5.4* 4.4  CL 98 96* 103 109 105  CO2 23 24 17*  --  17*  GLUCOSE 87 131* 104* 97 93  BUN 72* 64* 101* 104* 102*  CALCIUM 7.6* 8.7* 7.6*  --  7.7*  CREATININE 16.17* 15.26* 19.77* >18.00* 19.28*  GFRNONAA 2* 2* 2*  --  2*  GFRAA 3* 3* 2*  --  2*    LIVER FUNCTION TESTS: Recent Labs    09/24/17 1538 10/03/17 1425 01/22/18 1817 03/09/18 2012  BILITOT 0.6 0.4 0.6 0.9  AST 51* 16 17 23   ALT 72* 16 11 13   ALKPHOS 58 50 43 57  PROT 6.6 6.9 5.7* 6.1*  ALBUMIN 2.8* 2.7* 2.3* 3.0*    TUMOR MARKERS: No results for input(s): AFPTM, CEA, CA199, CHROMGRNA in the last 8760  hours.  Assessment and  Plan: Renal failure, inability to continue with PD Patient maintained dialysis via PD for the past 2 years.  This past week she has not been able to successfully dialyze leading to significant fluid overload.  She does have a RUA fistula but does not wish to use at this time. Requests a tunneled catheter.  She has been NPO today.  Last dose SQ heparin at 6AM.  INR 1.17.  Risks and benefits discussed with the patient including, but not limited to bleeding, infection, vascular injury, pneumothorax which may require chest tube placement, air embolism or even death  All of the patient's questions were answered, patient is agreeable to proceed. Consent signed and in chart.  Plan to proceed today as schedule allows.   Thank you for this interesting consult.  I greatly enjoyed meeting INDY PRESTWOOD and look forward to participating in their care.  A copy of this report was sent to the requesting provider on this date.  Electronically Signed: Docia Barrier, PA 03/10/2018, 1:08 PM   I spent a total of 40 Minutes    in face to face in clinical consultation, greater than 50% of which was counseling/coordinating care for renal failure.

## 2018-03-10 NOTE — Procedures (Signed)
Placement of left jugular central line.  Tip in SVC and ready to use.  Minimal blood loss and no immediate complication.

## 2018-03-10 NOTE — Progress Notes (Signed)
New Admission Note:  Arrival Method: Stretcher Mental Orientation: Alert and oriented x 4 Telemetry: Box 08 Assessment: Completed Skin: Warm and dry  IV: NSL Pain: Denies Tubes: PD cath  Safety Measures: Safety Fall Prevention Plan initiated.  Admission: Completed 5 M  Orientation: Patient has been orientated to the room, unit and the staff. Welcome booklet given.  Family: Friend  Orders have been reviewed and implemented. Will continue to monitor the patient. Call light has been placed within reach and bed alarm has been activated.   Sima Matas BSN, RN  Phone Number: [phone]

## 2018-03-10 NOTE — Progress Notes (Signed)
Patient B/ Pwas 200/96 10 mg of hydralazine was given at 8:43am. MD was paged. AT 1120 patient B/P 196/103 and patient complains of chest pain.MD Heber [place]mont was paged orders for EKG was given and  5mg  of hydralazine was order MD is aware that patient will be going to HD today.

## 2018-03-11 DIAGNOSIS — R609 Edema, unspecified: Secondary | ICD-10-CM

## 2018-03-11 DIAGNOSIS — Z79899 Other long term (current) drug therapy: Secondary | ICD-10-CM | POA: Diagnosis not present

## 2018-03-11 DIAGNOSIS — G40909 Epilepsy, unspecified, not intractable, without status epilepticus: Secondary | ICD-10-CM

## 2018-03-11 DIAGNOSIS — N186 End stage renal disease: Secondary | ICD-10-CM | POA: Diagnosis not present

## 2018-03-11 DIAGNOSIS — Z5189 Encounter for other specified aftercare: Secondary | ICD-10-CM | POA: Diagnosis not present

## 2018-03-11 DIAGNOSIS — I10 Essential (primary) hypertension: Secondary | ICD-10-CM

## 2018-03-11 DIAGNOSIS — N2581 Secondary hyperparathyroidism of renal origin: Secondary | ICD-10-CM | POA: Diagnosis not present

## 2018-03-11 DIAGNOSIS — G8114 Spastic hemiplegia affecting left nondominant side: Secondary | ICD-10-CM

## 2018-03-11 DIAGNOSIS — R17 Unspecified jaundice: Secondary | ICD-10-CM | POA: Diagnosis not present

## 2018-03-11 DIAGNOSIS — N19 Unspecified kidney failure: Secondary | ICD-10-CM

## 2018-03-11 DIAGNOSIS — D509 Iron deficiency anemia, unspecified: Secondary | ICD-10-CM | POA: Diagnosis not present

## 2018-03-11 LAB — RENAL FUNCTION PANEL
ANION GAP: 19 — AB (ref 5–15)
Albumin: 2.7 g/dL — ABNORMAL LOW (ref 3.5–5.0)
BUN: 72 mg/dL — AB (ref 6–20)
CHLORIDE: 102 mmol/L (ref 98–111)
CO2: 21 mmol/L — ABNORMAL LOW (ref 22–32)
Calcium: 8.6 mg/dL — ABNORMAL LOW (ref 8.9–10.3)
Creatinine, Ser: 16.15 mg/dL — ABNORMAL HIGH (ref 0.44–1.00)
GFR calc non Af Amer: 2 mL/min — ABNORMAL LOW (ref >60)
GFR, EST AFRICAN AMERICAN: 3 mL/min — AB (ref >60)
Glucose, Bld: 94 mg/dL (ref 70–99)
POTASSIUM: 4 mmol/L (ref 3.5–5.1)
Phosphorus: 10.4 mg/dL — ABNORMAL HIGH (ref 2.5–4.6)
Sodium: 142 mmol/L (ref 135–145)

## 2018-03-11 LAB — HEPATITIS B SURFACE ANTIGEN: Hepatitis B Surface Ag: NEGATIVE

## 2018-03-11 LAB — CBC
HEMATOCRIT: 42 % (ref 36.0–46.0)
HEMOGLOBIN: 13 g/dL (ref 12.0–15.0)
MCH: 30.1 pg (ref 26.0–34.0)
MCHC: 31 g/dL (ref 30.0–36.0)
MCV: 97.2 fL (ref 78.0–100.0)
Platelets: 271 10*3/uL (ref 150–400)
RBC: 4.32 MIL/uL (ref 3.87–5.11)
RDW: 16.4 % — AB (ref 11.5–15.5)
WBC: 10 10*3/uL (ref 4.0–10.5)

## 2018-03-11 MED ORDER — HYDRALAZINE HCL 20 MG/ML IJ SOLN
5.0000 mg | Freq: Once | INTRAMUSCULAR | Status: AC
  Administered 2018-03-11: 5 mg via INTRAVENOUS
  Filled 2018-03-11: qty 1

## 2018-03-11 MED ORDER — HYDRALAZINE HCL 10 MG PO TABS
10.0000 mg | ORAL_TABLET | Freq: Three times a day (TID) | ORAL | Status: DC
  Administered 2018-03-11 – 2018-03-12 (×2): 10 mg via ORAL
  Filled 2018-03-11 (×2): qty 1

## 2018-03-11 MED ORDER — CHLORHEXIDINE GLUCONATE CLOTH 2 % EX PADS
6.0000 | MEDICATED_PAD | Freq: Every day | CUTANEOUS | Status: DC
  Administered 2018-03-12: 6 via TOPICAL

## 2018-03-11 MED ORDER — HEPARIN SODIUM (PORCINE) 1000 UNIT/ML DIALYSIS: 3000.0000 [IU] | INTRAMUSCULAR | Status: DC | PRN

## 2018-03-11 NOTE — Progress Notes (Addendum)
[facility] Progress Note  Subjective: no c/o, had HD yest using the AVF w/o issues.  2L removed.   Vitals:   03/10/18 2256 03/11/18 0544 03/11/18 0859 03/11/18 1006  BP: (!) 181/99 (!) 167/89 (!) 183/94 (!) 152/78  Pulse:  89 93 94  Resp: 20 18 20    Temp: 98.6 F (37 C) 98.3 F (36.8 C) 98 F (36.7 C)   TempSrc: Oral Oral Oral   SpO2: 98% 92% (!) 89%   Weight: 70 kg     Height:        Inpatient medications: . amLODipine  10 mg Oral Daily  . aspirin EC  81 mg Oral Daily  . calcitRIOL  0.25 mcg Oral Q M,W,F-HD  . calcium acetate  1,334 mg Oral TID WC  . carvedilol  25 mg Oral BID  . cinacalcet  60 mg Oral Once per day on Mon Wed Fri  . gabapentin  100 mg Oral QHS  . heparin  5,000 Units Subcutaneous Q8H  . levETIRAcetam  500 mg Oral BID  . levothyroxine  112 mcg Oral QAC breakfast  . multivitamin with minerals  1 tablet Oral Daily  . pantoprazole  40 mg Oral BID  . sodium chloride flush  3 mL Intravenous Q12H   .  ceFAZolin (ANCEF) IV     acetaminophen **OR** acetaminophen, calcium acetate, hydrALAZINE, lidocaine, sodium chloride flush  Iron/TIBC/Ferritin/ %Sat    Component Value Date/Time   IRON 52 01/22/2018 2226   TIBC 204 (L) 01/22/2018 2226   FERRITIN 792 (H) 01/22/2018 2226   IRONPCTSAT 25 01/22/2018 2226    Exam: General: alert  WD, WN Neck: supple, no JVD Heart: RRR, 1/6sem , no rug or galolop Lungs: clear bilat  Abdomen: BS pos soft , NT, ND, PD Cath clean and dry Extremities: diffuse 1-2+ edema x 4 ext Neuro: OX3, moderate L hemiparesis Dialysis Access: R UA AVF  Pos . Bruit/ PD cath in place  Dialysis:  EDW 50.5 kg  Ca 2.5  was on CCPD 7x/wk  #6 exchanges ,2000 fill vol  dwell time 1.5 hr  on  Last fill vol 1046ml  0 day exchanges  / Day dwell time 3hr     OP ESRD Labs  HGB 9.8  ( 02/07/18)  TFS 45%   NO ESA,or FE                                PTH 405  (02/07/18)   Pth 766 (01/21/18)    Assessment/Plan 1. ESRD/ uremia/  volume overload/ CCPD failure- transitioning to HD, 2nd HD today and plan 3rd HD tomorrow. Will get pt CLIP'd hopefully by end of day today.  2. Hypertension/volume  - cont meds, lower vol (up 20kg) 3. Anemia  - Currently hgb 12.4 no esa needs 4. Metabolic bone disease -  On sensipar mon, wed fri, ,Phoslo as binder,no vit d ,start Po Calcitriol 0.3mcg on MFW hd  with op pth elevated this year.   Corrected Ca 8.5   Follow up ca/phos trend in hosp. 5. Nutrition - renal diet , renal vit 6. HO CVA with Left sided weakness  -NO HEP HD /ambulates with cane, 7. HO Sizure disorder - on Keppra  8. HO hypothyroidism  - meds per admit    [name] [facility] pager [phone]   03/11/2018, 11:31 AM   Recent Labs  Lab 03/09/18 2012 03/09/18  2024 03/10/18 0415 03/10/18 1132  NA 144 139 146*  --   K 5.4* 5.4* 4.4  --   CL 103 109 105  --   CO2 17*  --  17*  --   GLUCOSE 104* 97 93  --   BUN 101* 104* 102*  --   CREATININE 19.77* >18.00* 19.28*  --   CALCIUM 7.6*  --  7.7*  --   ALBUMIN 3.0*  --   --   --   INR  --   --   --  1.17   Recent Labs  Lab 03/09/18 2012  AST 23  ALT 13  ALKPHOS 57  BILITOT 0.9  PROT 6.1*   Recent Labs  Lab 03/09/18 2012 03/09/18 2024 03/10/18 0415  WBC 8.4  --  7.9  HGB 13.3 14.3 12.4  HCT 43.4 42.0 39.9  MCV 100.5*  --  96.4  PLT 252  --  241

## 2018-03-11 NOTE — Progress Notes (Signed)
OT Cancellation    03/11/18 1400  OT Visit Information  Last OT Received On 03/11/18  Reason Eval/Treat Not Completed Patient at procedure or test/ unavailable (HD)  Maurie Boettcher, OT/L  OT Clinical Specialist [phone]

## 2018-03-11 NOTE — Progress Notes (Signed)
PT Cancellation Note  Patient Details Name: Terry Juarez MRN: [id number] DOB: 1966-06-07   Cancelled Treatment:    Reason Eval/Treat Not Completed: Patient at procedure or test/unavailable (HD)  Ellamae Sia, PT, DPT [place] Pager [phone] Office [phone]     Willy Eddy 03/11/2018, 2:28 PM

## 2018-03-11 NOTE — Progress Notes (Signed)
PROGRESS NOTE  Terry Juarez DQQ:[id number] DOB: 1965-10-31 DOA: 03/09/2018 PCP: [name], MD  HPI/Recap of past 24 hours:  Patient is seen after returning from dialysis ,reports feeling better, able to breath better, less edema She denies pain, no fever  Assessment/Plan: Active Problems:   ESRD on peritoneal dialysis ([place])   Seizure disorder (HCC)   Hypothyroidism   Left spastic hemiparesis (HCC)   Volume overload   Peripheral edema   SOB (shortness of breath)   Uremia  ESRD -h/o RCC with r nephrectomy -presented with volume overload, uremia, ccpd failure --per nephrology "Noted with prior CVA admit she had Cardiac arrest with attempting to use her AVF ,she refuses to use it  and will allow a perm cath placement  by IR . -a non tunneled central venous catheter was placed  By IR on 9/5, she  is started on HD, today is day #2 dialysis, nephrology plan to do the third dialysis tomorrow - permanent dialysis access, outpatient HD per nephrology -Nephrology input appreciated  HTN;  Blood pressure not well controlled on home meds norvasc and coreg Elevated blood pressure likely due to volume overload Continue getting HD  start low dose scheduled hydralazine She has h/o press syndrome due to uncontrolled bp, will need to continue titrate bp meds  H/o intracranial hemorrhage with Right posterior frontal lobe acute hemorrhage/ subarachnoid hemorrhage  in 2018 with left sided residual weakness, walks with a cane Fibromuscular dysplasia of bilateral ICAs She is on asa 81mg  at home which is continued  H/o Seizure disorder - on Keppra   Hypothyroidism - Continue home synthroid 112 mcg QD  Peripheral Neuropathy - Continue home gabapentin  Code Status: full  Family Communication: patient   Disposition Plan: home in 1-2 days with nephrology clearance, need to set up outpatient dialysis prior to discharge   Consultants:  IR  nephrology  Procedures: a non  tunneled central venous catheter was placed  By IR on 9/5, Hemodialysis   Antibiotics:  none   Objective: BP (!) 191/110 (BP Location: Left Arm)   Pulse 90   Temp 98 F (36.7 C)   Resp 18   Ht 5\' 5"  (1.651 m)   Wt 68 kg   SpO2 100%   BMI 24.95 kg/m   Intake/Output Summary (Last 24 hours) at 03/11/2018 1646 Last data filed at 03/11/2018 1507 Gross per 24 hour  Intake 420 ml  Output 4500 ml  Net -4080 ml   Filed Weights   03/10/18 2256 03/11/18 1122 03/11/18 1507  Weight: 70 kg 70 kg 68 kg    Exam: Patient is examined daily including today on 03/11/2018, exams remain the same as of yesterday except that has changed    General:  NAD  Cardiovascular: RRR  Respiratory: diminished at basis  Abdomen: Soft/ND/NT, positive BS  Musculoskeletal: bilateral lower extremity pitting Edema  Neuro: alert, oriented , residual left side weakness   Data Reviewed: Basic Metabolic Panel: Recent Labs  Lab 03/09/18 2012 03/09/18 2024 03/10/18 0415 03/11/18 1336  NA 144 139 146* 142  K 5.4* 5.4* 4.4 4.0  CL 103 109 105 102  CO2 17*  --  17* 21*  GLUCOSE 104* 97 93 94  BUN 101* 104* 102* 72*  CREATININE 19.77* >18.00* 19.28* 16.15*  CALCIUM 7.6*  --  7.7* 8.6*  PHOS  --   --   --  10.4*   Liver Function Tests: Recent Labs  Lab 03/09/18 2012 03/11/18 1336  AST  23  --   ALT 13  --   ALKPHOS 57  --   BILITOT 0.9  --   PROT 6.1*  --   ALBUMIN 3.0* 2.7*   No results for input(s): LIPASE, AMYLASE in the last 168 hours. No results for input(s): AMMONIA in the last 168 hours. CBC: Recent Labs  Lab 03/09/18 2012 03/09/18 2024 03/10/18 0415 03/11/18 1335  WBC 8.4  --  7.9 10.0  HGB 13.3 14.3 12.4 13.0  HCT 43.4 42.0 39.9 42.0  MCV 100.5*  --  96.4 97.2  PLT 252  --  241 271   Cardiac Enzymes:   No results for input(s): CKTOTAL, CKMB, CKMBINDEX, TROPONINI in the last 168 hours. BNP (last 3 results) No results for input(s): BNP in the last 8760 hours.  ProBNP  (last 3 results) No results for input(s): PROBNP in the last 8760 hours.  CBG: No results for input(s): GLUCAP in the last 168 hours.  No results found for this or any previous visit (from the past 240 hour(s)).   Studies: No results found.  Scheduled Meds: . amLODipine  10 mg Oral Daily  . aspirin EC  81 mg Oral Daily  . calcitRIOL  0.25 mcg Oral Q M,W,F-HD  . calcium acetate  1,334 mg Oral TID WC  . carvedilol  25 mg Oral BID  . Chlorhexidine Gluconate Cloth  6 each Topical Q0600  . cinacalcet  60 mg Oral Once per day on Mon Wed Fri  . gabapentin  100 mg Oral QHS  . heparin  5,000 Units Subcutaneous Q8H  . levETIRAcetam  500 mg Oral BID  . levothyroxine  112 mcg Oral QAC breakfast  . multivitamin with minerals  1 tablet Oral Daily  . pantoprazole  40 mg Oral BID  . sodium chloride flush  3 mL Intravenous Q12H    Continuous Infusions:   Time spent: 15mins I have personally reviewed and interpreted on  03/11/2018 daily labs, tele strips, imagings as discussed above under date review session and assessment and plans.  I reviewed all nursing notes, pharmacy notes, consultant notes,  vitals, pertinent old records  I have discussed plan of care as described above with RN , patient  on 03/11/2018   Florencia Reasons MD, PhD  [facility] Pager [phone]. If 7PM-7AM, please contact night-coverage at [email or web address], password [facility] 03/11/2018, 4:46 PM  LOS: 2 days

## 2018-03-11 NOTE — Care Management Important Message (Signed)
Important Message  Patient Details  Name: Terry Juarez MRN: [id number] Date of Birth: 1966/03/12   Medicare Important Message Given:  Yes    Orbie Pyo 03/11/2018, 3:40 PM

## 2018-03-12 ENCOUNTER — Inpatient Hospital Stay (HOSPITAL_COMMUNITY): Payer: Medicare Other

## 2018-03-12 DIAGNOSIS — I361 Nonrheumatic tricuspid (valve) insufficiency: Secondary | ICD-10-CM

## 2018-03-12 DIAGNOSIS — Z79899 Other long term (current) drug therapy: Secondary | ICD-10-CM | POA: Diagnosis not present

## 2018-03-12 DIAGNOSIS — Z5189 Encounter for other specified aftercare: Secondary | ICD-10-CM | POA: Diagnosis not present

## 2018-03-12 DIAGNOSIS — N2581 Secondary hyperparathyroidism of renal origin: Secondary | ICD-10-CM | POA: Diagnosis not present

## 2018-03-12 DIAGNOSIS — R17 Unspecified jaundice: Secondary | ICD-10-CM | POA: Diagnosis not present

## 2018-03-12 DIAGNOSIS — D509 Iron deficiency anemia, unspecified: Secondary | ICD-10-CM | POA: Diagnosis not present

## 2018-03-12 DIAGNOSIS — N186 End stage renal disease: Secondary | ICD-10-CM | POA: Diagnosis not present

## 2018-03-12 DIAGNOSIS — I69154 Hemiplegia and hemiparesis following nontraumatic intracerebral hemorrhage affecting left non-dominant side: Secondary | ICD-10-CM

## 2018-03-12 DIAGNOSIS — J90 Pleural effusion, not elsewhere classified: Secondary | ICD-10-CM

## 2018-03-12 LAB — CBC
HEMATOCRIT: 37.8 % (ref 36.0–46.0)
Hemoglobin: 11.6 g/dL — ABNORMAL LOW (ref 12.0–15.0)
MCH: 30.2 pg (ref 26.0–34.0)
MCHC: 30.7 g/dL (ref 30.0–36.0)
MCV: 98.4 fL (ref 78.0–100.0)
PLATELETS: 242 10*3/uL (ref 150–400)
RBC: 3.84 MIL/uL — ABNORMAL LOW (ref 3.87–5.11)
RDW: 16.2 % — AB (ref 11.5–15.5)
WBC: 11.6 10*3/uL — ABNORMAL HIGH (ref 4.0–10.5)

## 2018-03-12 LAB — ECHOCARDIOGRAM COMPLETE
HEIGHTINCHES: 65 in
WEIGHTICAEL: 2398.6 [oz_av]

## 2018-03-12 LAB — LIPID PANEL
CHOL/HDL RATIO: 3.8 ratio
Cholesterol: 199 mg/dL (ref 0–200)
HDL: 52 mg/dL (ref >40)
LDL Cholesterol: 132 mg/dL — ABNORMAL HIGH (ref 0–99)
Triglycerides: 74 mg/dL (ref <150)
VLDL: 15 mg/dL (ref 0–40)

## 2018-03-12 LAB — RENAL FUNCTION PANEL
Albumin: 2.5 g/dL — ABNORMAL LOW (ref 3.5–5.0)
Anion gap: 17 — ABNORMAL HIGH (ref 5–15)
BUN: 54 mg/dL — AB (ref 6–20)
CHLORIDE: 100 mmol/L (ref 98–111)
CO2: 22 mmol/L (ref 22–32)
CREATININE: 13.83 mg/dL — AB (ref 0.44–1.00)
Calcium: 8.2 mg/dL — ABNORMAL LOW (ref 8.9–10.3)
GFR calc Af Amer: 3 mL/min — ABNORMAL LOW (ref >60)
GFR, EST NON AFRICAN AMERICAN: 3 mL/min — AB (ref >60)
GLUCOSE: 109 mg/dL — AB (ref 70–99)
POTASSIUM: 3.8 mmol/L (ref 3.5–5.1)
Phosphorus: 7.8 mg/dL — ABNORMAL HIGH (ref 2.5–4.6)
Sodium: 139 mmol/L (ref 135–145)

## 2018-03-12 LAB — TSH: TSH: 14.799 u[IU]/mL — ABNORMAL HIGH (ref 0.350–4.500)

## 2018-03-12 MED ORDER — SEVELAMER CARBONATE 800 MG PO TABS: 2400.0000 mg | ORAL_TABLET | Freq: Three times a day (TID) | ORAL | 0 refills | Status: DC

## 2018-03-12 MED ORDER — CALCITRIOL 0.25 MCG PO CAPS: 0.2500 ug | ORAL_CAPSULE | ORAL | 0 refills | Status: DC

## 2018-03-12 MED ORDER — SEVELAMER CARBONATE 800 MG PO TABS
2400.0000 mg | ORAL_TABLET | Freq: Three times a day (TID) | ORAL | Status: DC
  Administered 2018-03-12: 2400 mg via ORAL
  Filled 2018-03-12: qty 3

## 2018-03-12 NOTE — Discharge Summary (Signed)
Discharge Summary  [facility] NOM:[id number] DOB: 23-Sep-1965  PCP: Terry Pao, MD  Admit date: 03/09/2018 Discharge date: 03/12/2018  Time spent: 10mins, more than 50% time spent on coordination of care.  Recommendations for Outpatient Follow-up:  1. F/u with PMD within a week  for [facility] discharge follow up, repeat cbc/bmp at follow up 2. F/u with nephrology , continue HD MWF schedule at [facility], can start 9/9 at 6:15am. 3. Outpatient PT   Discharge Diagnoses:  Active [facility] Problems   Diagnosis Date Noted  . Peripheral edema   . SOB (shortness of breath)   . Uremia   . Volume overload 03/09/2018  . Left spastic hemiparesis ([facility])   . Hypothyroidism   . Seizure disorder ([facility])   . ESRD on peritoneal dialysis [facility]) 02/12/2015    Resolved [facility] Problems  No resolved problems to display.    Discharge Condition: stable  Diet recommendation: renal diet  Filed Weights   03/11/18 1122 03/11/18 1507 03/12/18 1410  Weight: 70 kg 68 kg 68.4 kg    History of present illness: (per admitting MD Dr [facility]) Chief Complaint: dyspnea  History of Present Illness:  [name] is a 52 yo female with a medical history of ESRD on PD, hemorrhagic CVA with residual seizures on Keppra and LUE weakness, hypothyroidism, and hypertension who presents with leg swelling and dyspnea for the past 3 weeks. She is accompanied by her home health nurse who helps provide the history. She has been on peritoneal dialysis for two years. Over the past three weeks, her dialysis has not been effectively removing fluid. They have tried to change the duration of PD and the solution strength with no improvement. She started lasix 80 mg BID about a week ago. At baseline she makes little urine, and the lasix causes a mild increase in urine output. During the past three weeks she has had gradual onset of lower extremity swelling, dyspnea, and orthopnea. She has been unable to lay flat and has been using a  large pillow to prop herself up at night. She also has marked swelling of her left hand and arm, which she is unable to move s/p CVA. Her nurse reports that her dry weight is 51 kg and her current weight is 74 kg (50 lb weight gain in 3 weeks). The patient also reports that her blood pressures have been running high, which she believes is causing a headache (throbbing, frontal) and blurry vision. She denies fevers, chills, chest pain, or abdominal pain.  Of note, she had a right-sided frontal lobe hemorrhage CVA in June of 2018 with associated seizures and L hemiparesis. During her hospitalization for this event she was transitioned from PD to HD, but had a cardiac arrest during hemodialysis using her right upper arm AVF. ROSC was achieved with atropine without defibrillation or compressions. She declined another trial of HD and resumed PD.  In the ED, vitals were T 98.6, RR 18, HR 79, BP 181/101, O2 sat 96% on room air. Labs were significant for K 5.4, CO2 17, BUN 101, Cr 19.77, and Ca 7.6. EKG showed NSR without changes from prior. CXR showed cardiomegaly with mild CHF and bilateral pleural effusions (L > R). The case was discussed with nephrology who recommended placing a tunneled cath for HD.  [facility] Course:  Active Problems:   ESRD on peritoneal dialysis [facility])   Seizure disorder ([facility])   Hypothyroidism   Left spastic hemiparesis ([facility])   Volume overload   Peripheral edema  SOB (shortness of breath)   Uremia   ESRD -h/o [facility] with r nephrectomy -presented with volume overload, uremia, ccpd failure --per nephrology "Noted with prior CVA admit she had Cardiac arrest with attempting to use her AVF ,she refuses to use it and will allow a perm cath placement by IR " -a non tunneled central venous catheter was placed  By IR on 9/5, she  received daily HDx3 in the [facility], now she is started to use AVF, central venous catheter can be removed per nephrology -  dialysis access, outpatient HD  per nephrology ( per nephrology PD catheter will be removed later) -Nephrology input appreciated -patient is cleared to d/c home per nephrology   Secondary hyperparathyroidism: Ca ok now, Phos very high. Changing binder from Phoslo -> Renvela 3/meals. Continue PO calcitriol TIW and Sensipar  Loud precordial murmur Appear to be continuous Currently denies chest pain, she reports has chest pain initially on presentation echocardiogram lvef 55%, grade II diastolic dysfunction, valve unremarkable but showed large left pleural effusion Case discussed with nephrology Dr Jonnie Finner, per Dr Jonnie Finner the murmur is likely radiating from her fistula. Left sided pleural effusion will likely improve with continue HD. Patient is cleared to d/c home per nephrology.  Patient ambulated, no hypoxia, no sob.  HTN;  continue home meds norvasc and coreg Elevated blood pressure likely due to volume overload Continue getting HD  She has h/o press syndrome due to uncontrolled bp, will need to continue titrate bp meds  H/o intracranial hemorrhage with Right posterior frontal lobe acute hemorrhage/ subarachnoid hemorrhage  in 2018 with left sided residual weakness, walks with a cane Fibromuscular dysplasia of bilateral ICAs She is on asa 81mg  at home which is continued  H/o Seizure disorder - on Keppra   Hypothyroidism - Continue home synthroid 112 mcg QD  Peripheral Neuropathy - Continue home gabapentin  Code Status: full  Family Communication: patient   Disposition Plan: home after dialysis  Central line need to be removed prior to discharge, order placed, RN notified.    Consultants:  IR  nephrology  Procedures: a non tunneled central venous catheter was placed  By IR on 9/5, Hemodialysis   Antibiotics:  none   Discharge Exam: BP (!) 154/80   Pulse 80   Temp 98.7 F (37.1 C) (Oral)   Resp 16   Ht 5\' 5"  (1.651 m)   Wt 68.4 kg   SpO2 90%   BMI 25.09 kg/m     General:  NAD, flat affect  Cardiovascular: RRR, continuous loud precordial murmur   Respiratory: diminished at basis  Abdomen: Soft/ND/NT, positive BS  Musculoskeletal: bilateral lower extremity pitting Edema has largely resolved  Neuro: alert, oriented , residual left side weakness  (left upper extremity flaccid, left lower extremity weak but able to lift against gravity)   Discharge Instructions You were cared for by a hospitalist during your [facility] stay. If you have any questions about your discharge medications or the care you received while you were in the [facility] after you are discharged, you can call the unit and asked to speak with the hospitalist on call if the hospitalist that took care of you is not available. Once you are discharged, your primary care physician will handle any further medical issues. Please note that NO REFILLS for any discharge medications will be authorized once you are discharged, as it is imperative that you return to your primary care physician (or establish a relationship with a primary care physician if you  do not have one) for your aftercare needs so that they can reassess your need for medications and monitor your lab values.  Discharge Instructions    Ambulatory referral to Physical Therapy   Complete by:  As directed    Diet general   Complete by:  As directed    Renal diet   Increase activity slowly   Complete by:  As directed      Allergies as of 03/12/2018      Reactions   Lisinopril Cough      Medication List    STOP taking these medications   calcium acetate 667 MG capsule Commonly known as:  PHOSLO     TAKE these medications   acetaminophen 500 MG tablet Commonly known as:  TYLENOL Take 1,000 mg by mouth every 6 (six) hours as needed for moderate pain or headache.   amLODipine 10 MG tablet Commonly known as:  NORVASC Take 1 tablet (10 mg total) by mouth daily.   aspirin EC 81 MG tablet Take 1 tablet (81 mg total) by  mouth daily.   calcitRIOL 0.25 MCG capsule Commonly known as:  ROCALTROL Take 1 capsule (0.25 mcg total) by mouth every Monday, Wednesday, and Friday with hemodialysis.   carvedilol 25 MG tablet Commonly known as:  COREG Take 25 mg by mouth 2 (two) times daily.   cinacalcet 60 MG tablet Commonly known as:  SENSIPAR Take 60 mg by mouth every Monday, Wednesday, and Friday.   furosemide 80 MG tablet Commonly known as:  LASIX Take 80 mg by mouth 2 (two) times daily.   gabapentin 100 MG capsule Commonly known as:  NEURONTIN Take 100 mg by mouth at bedtime.   gentamicin cream 0.1 % Commonly known as:  GARAMYCIN Apply 1 application topically daily.   heparin 1000 UNIT/ML injection Inject 5,000 Units into the vein as needed (fiber in her catheter). In 5L of fluid   levETIRAcetam 500 MG tablet Commonly known as:  KEPPRA Take 1 tablet (500 mg total) by mouth 2 (two) times daily.   levothyroxine 112 MCG tablet Commonly known as:  SYNTHROID, LEVOTHROID Take 112 mcg by mouth daily.   multivitamin Tabs tablet Take 1 tablet by mouth daily.   pantoprazole 40 MG tablet Commonly known as:  PROTONIX Take 1 tablet (40 mg total) by mouth 2 (two) times daily.   sevelamer carbonate 800 MG tablet Commonly known as:  RENVELA Take 3 tablets (2,400 mg total) by mouth 3 (three) times daily with meals.            Durable Medical Equipment  (From admission, onward)         Start     Ordered   03/12/18 1633  DME 3-in-1  Once     03/12/18 1632         Allergies  Allergen Reactions  . Lisinopril Cough   Follow-up Information    Tisovec, Fransico Him, MD Follow up.   Specialty:  Internal Medicine Contact information: [address] [place] [address] [phone]        follow up with nephrology,continue dialysis Follow up.   Why:  MWF schedule at [facility], can start 9/9 at 6:15am.           The results of significant diagnostics from this hospitalization (including  imaging, microbiology, ancillary and laboratory) are listed below for reference.    Significant Diagnostic Studies: Dg Chest 2 View  Result Date: 03/09/2018 CLINICAL DATA:  Extremity swelling and dyspnea EXAM: CHEST -  2 VIEW COMPARISON:  12/04/2017 FINDINGS: AP upright view of the chest. Pulmonary vascular redistribution, cardiomegaly and small bilateral pleural effusions, left greater than right are identified consistent with CHF. Aortic atherosclerosis without aneurysmal dilatation is noted at the arch. No acute fracture nor aggressive osseous lesions. IMPRESSION: Cardiomegaly with mild CHF and small posterior left greater than right pleural effusions. Electronically Signed   By: Ashley Royalty M.D.   On: 03/09/2018 20:35   Ir Fluoro Guide Cv Line Left  Result Date: 03/10/2018 INDICATION: 52 year old with end-stage renal disease. Patient has poor venous access and needs peripheral or central venous access. EXAM: FLUOROSCOPIC AND ULTRASOUND GUIDED PLACEMENT OF A NON TUNNELED CENTRAL VENOUS CATHETER Physician: Stephan Minister. Henn, MD MEDICATIONS: None ANESTHESIA/SEDATION: None FLUOROSCOPY TIME:  36 seconds, 3 mGy COMPLICATIONS: None immediate. PROCEDURE: Patent left cephalic vein in the left forearm was identified with ultrasound. A tourniquet was placed. The forearm was prepped and draped in sterile fashion. Multiple attempts were made to place a peripheral IV in this vein with ultrasound guidance but the catheter would not thread into the vein. Therefore, attention was directed to placing a central venous catheter. Patient has a right arm fistula and selected the left side of the neck and chest for central venous access. Ultrasound confirmed a patent left internal jugular vein. Ultrasound image was saved for documentation. The procedure was explained to the patient. The risks and benefits of the procedure were discussed and the patient's questions were addressed. Informed consent was obtained from the patient. Left  side of the neck was prepped and draped in sterile fashion. Maximal barrier sterile technique was utilized including caps, mask, sterile gowns, sterile gloves, sterile drape, hand hygiene and skin antiseptic. Skin was anesthetized with 1% lidocaine. 21 gauge needle directed into the left internal jugular vein with ultrasound guidance. Wire was advanced centrally. A peel-away sheath was placed. A dual lumen Power PICC line was cut to 19 cm. Catheter was advanced through the peel-away sheath and positioned in the lower SVC. Both lumens aspirated and flushed well. Catheter was sutured to skin. Fluoroscopic and ultrasound images were taken and saved for documentation. FINDINGS: Left jugular central venous catheter. Catheter tip in the lower SVC. IMPRESSION: Successful placement of a non tunneled central venous catheter with ultrasound and fluoroscopic guidance. Electronically Signed   By: Markus Daft M.D.   On: 03/10/2018 18:14   Ir US Guide Vasc Access Left  Result Date: 03/10/2018 INDICATION: 52 year old with end-stage renal disease. Patient has poor venous access and needs peripheral or central venous access. EXAM: FLUOROSCOPIC AND ULTRASOUND GUIDED PLACEMENT OF A NON TUNNELED CENTRAL VENOUS CATHETER Physician: Stephan Minister. Henn, MD MEDICATIONS: None ANESTHESIA/SEDATION: None FLUOROSCOPY TIME:  36 seconds, 3 mGy COMPLICATIONS: None immediate. PROCEDURE: Patent left cephalic vein in the left forearm was identified with ultrasound. A tourniquet was placed. The forearm was prepped and draped in sterile fashion. Multiple attempts were made to place a peripheral IV in this vein with ultrasound guidance but the catheter would not thread into the vein. Therefore, attention was directed to placing a central venous catheter. Patient has a right arm fistula and selected the left side of the neck and chest for central venous access. Ultrasound confirmed a patent left internal jugular vein. Ultrasound image was saved for  documentation. The procedure was explained to the patient. The risks and benefits of the procedure were discussed and the patient's questions were addressed. Informed consent was obtained from the patient. Left side of the neck was prepped  and draped in sterile fashion. Maximal barrier sterile technique was utilized including caps, mask, sterile gowns, sterile gloves, sterile drape, hand hygiene and skin antiseptic. Skin was anesthetized with 1% lidocaine. 21 gauge needle directed into the left internal jugular vein with ultrasound guidance. Wire was advanced centrally. A peel-away sheath was placed. A dual lumen Power PICC line was cut to 19 cm. Catheter was advanced through the peel-away sheath and positioned in the lower SVC. Both lumens aspirated and flushed well. Catheter was sutured to skin. Fluoroscopic and ultrasound images were taken and saved for documentation. FINDINGS: Left jugular central venous catheter. Catheter tip in the lower SVC. IMPRESSION: Successful placement of a non tunneled central venous catheter with ultrasound and fluoroscopic guidance. Electronically Signed   By: Markus Daft M.D.   On: 03/10/2018 18:14    Microbiology: No results found for this or any previous visit (from the past 240 hour(s)).   Labs: Basic Metabolic Panel: Recent Labs  Lab 03/09/18 2012 03/09/18 2024 03/10/18 0415 03/11/18 1336 03/12/18 1431  NA 144 139 146* 142 139  K 5.4* 5.4* 4.4 4.0 3.8  CL 103 109 105 102 100  CO2 17*  --  17* 21* 22  GLUCOSE 104* 97 93 94 109*  BUN 101* 104* 102* 72* 54*  CREATININE 19.77* >18.00* 19.28* 16.15* 13.83*  CALCIUM 7.6*  --  7.7* 8.6* 8.2*  PHOS  --   --   --  10.4* 7.8*   Liver Function Tests: Recent Labs  Lab 03/09/18 2012 03/11/18 1336 03/12/18 1431  AST 23  --   --   ALT 13  --   --   ALKPHOS 57  --   --   BILITOT 0.9  --   --   PROT 6.1*  --   --   ALBUMIN 3.0* 2.7* 2.5*   No results for input(s): LIPASE, AMYLASE in the last 168 hours. No  results for input(s): AMMONIA in the last 168 hours. CBC: Recent Labs  Lab 03/09/18 2012 03/09/18 2024 03/10/18 0415 03/11/18 1335 03/12/18 1431  WBC 8.4  --  7.9 10.0 11.6*  HGB 13.3 14.3 12.4 13.0 11.6*  HCT 43.4 42.0 39.9 42.0 37.8  MCV 100.5*  --  96.4 97.2 98.4  PLT 252  --  241 271 242   Cardiac Enzymes: No results for input(s): CKTOTAL, CKMB, CKMBINDEX, TROPONINI in the last 168 hours. BNP: BNP (last 3 results) No results for input(s): BNP in the last 8760 hours.  ProBNP (last 3 results) No results for input(s): PROBNP in the last 8760 hours.  CBG: No results for input(s): GLUCAP in the last 168 hours.     Signed:  Florencia Reasons MD, PhD  [facility] 03/12/2018, 4:32 PM

## 2018-03-12 NOTE — Progress Notes (Addendum)
[facility] Progress Note   Subjective:  Seen in room, continues to improved. No overnight dyspnea or CP. CLIP process finalized - will be MWF schedule at [facility], can start 9/9 at 6:15am.  Objective Vitals:   03/11/18 1612 03/11/18 2037 03/12/18 0540 03/12/18 0822  BP: (!) 191/110 137/77 (!) 145/84 (!) 144/81  Pulse: 90 84 87 84  Resp: 18 18 18 18   Temp: 98 F (36.7 C) 98.5 F (36.9 C) 98.3 F (36.8 C) 98.2 F (36.8 C)  TempSrc:  Oral Oral Oral  SpO2: 100% 91% 90% 90%  Weight:      Height:       Physical Exam General: Well appearing, NAD. + supraorbital edema Heart: RRR; 2/6 murmur Lungs: CTAB Abdomen: soft, non-tender Extremities: Trace LE edema (improved) Dialysis Access: RUE AVF + bruit  Additional Objective Labs: Basic Metabolic Panel: Recent Labs  Lab 03/09/18 2012 03/09/18 2024 03/10/18 0415 03/11/18 1336  NA 144 139 146* 142  K 5.4* 5.4* 4.4 4.0  CL 103 109 105 102  CO2 17*  --  17* 21*  GLUCOSE 104* 97 93 94  BUN 101* 104* 102* 72*  CREATININE 19.77* >18.00* 19.28* 16.15*  CALCIUM 7.6*  --  7.7* 8.6*  PHOS  --   --   --  10.4*   Liver Function Tests: Recent Labs  Lab 03/09/18 2012 03/11/18 1336  AST 23  --   ALT 13  --   ALKPHOS 57  --   BILITOT 0.9  --   PROT 6.1*  --   ALBUMIN 3.0* 2.7*   CBC: Recent Labs  Lab 03/09/18 2012 03/09/18 2024 03/10/18 0415 03/11/18 1335  WBC 8.4  --  7.9 10.0  HGB 13.3 14.3 12.4 13.0  HCT 43.4 42.0 39.9 42.0  MCV 100.5*  --  96.4 97.2  PLT 252  --  241 271   Studies/Results: Ir Fluoro Guide Cv Line Left  Result Date: 03/10/2018 INDICATION: 52 year old with end-stage renal disease. Patient has poor venous access and needs peripheral or central venous access. EXAM: FLUOROSCOPIC AND ULTRASOUND GUIDED PLACEMENT OF A NON TUNNELED CENTRAL VENOUS CATHETER Physician: Stephan Minister. Henn, MD MEDICATIONS: None ANESTHESIA/SEDATION: None FLUOROSCOPY TIME:  36 seconds, 3 mGy COMPLICATIONS: None immediate.  PROCEDURE: Patent left cephalic vein in the left forearm was identified with ultrasound. A tourniquet was placed. The forearm was prepped and draped in sterile fashion. Multiple attempts were made to place a peripheral IV in this vein with ultrasound guidance but the catheter would not thread into the vein. Therefore, attention was directed to placing a central venous catheter. Patient has a right arm fistula and selected the left side of the neck and chest for central venous access. Ultrasound confirmed a patent left internal jugular vein. Ultrasound image was saved for documentation. The procedure was explained to the patient. The risks and benefits of the procedure were discussed and the patient's questions were addressed. Informed consent was obtained from the patient. Left side of the neck was prepped and draped in sterile fashion. Maximal barrier sterile technique was utilized including caps, mask, sterile gowns, sterile gloves, sterile drape, hand hygiene and skin antiseptic. Skin was anesthetized with 1% lidocaine. 21 gauge needle directed into the left internal jugular vein with ultrasound guidance. Wire was advanced centrally. A peel-away sheath was placed. A dual lumen Power PICC line was cut to 19 cm. Catheter was advanced through the peel-away sheath and positioned in the lower SVC. Both lumens aspirated and flushed well.  Catheter was sutured to skin. Fluoroscopic and ultrasound images were taken and saved for documentation. FINDINGS: Left jugular central venous catheter. Catheter tip in the lower SVC. IMPRESSION: Successful placement of a non tunneled central venous catheter with ultrasound and fluoroscopic guidance. Electronically Signed   By: Markus Daft M.D.   On: 03/10/2018 18:14   Ir US Guide Vasc Access Left  Result Date: 03/10/2018 INDICATION: 52 year old with end-stage renal disease. Patient has poor venous access and needs peripheral or central venous access. EXAM: FLUOROSCOPIC AND  ULTRASOUND GUIDED PLACEMENT OF A NON TUNNELED CENTRAL VENOUS CATHETER Physician: Stephan Minister. Henn, MD MEDICATIONS: None ANESTHESIA/SEDATION: None FLUOROSCOPY TIME:  36 seconds, 3 mGy COMPLICATIONS: None immediate. PROCEDURE: Patent left cephalic vein in the left forearm was identified with ultrasound. A tourniquet was placed. The forearm was prepped and draped in sterile fashion. Multiple attempts were made to place a peripheral IV in this vein with ultrasound guidance but the catheter would not thread into the vein. Therefore, attention was directed to placing a central venous catheter. Patient has a right arm fistula and selected the left side of the neck and chest for central venous access. Ultrasound confirmed a patent left internal jugular vein. Ultrasound image was saved for documentation. The procedure was explained to the patient. The risks and benefits of the procedure were discussed and the patient's questions were addressed. Informed consent was obtained from the patient. Left side of the neck was prepped and draped in sterile fashion. Maximal barrier sterile technique was utilized including caps, mask, sterile gowns, sterile gloves, sterile drape, hand hygiene and skin antiseptic. Skin was anesthetized with 1% lidocaine. 21 gauge needle directed into the left internal jugular vein with ultrasound guidance. Wire was advanced centrally. A peel-away sheath was placed. A dual lumen Power PICC line was cut to 19 cm. Catheter was advanced through the peel-away sheath and positioned in the lower SVC. Both lumens aspirated and flushed well. Catheter was sutured to skin. Fluoroscopic and ultrasound images were taken and saved for documentation. FINDINGS: Left jugular central venous catheter. Catheter tip in the lower SVC. IMPRESSION: Successful placement of a non tunneled central venous catheter with ultrasound and fluoroscopic guidance. Electronically Signed   By: Markus Daft M.D.   On: 03/10/2018 18:14    Medications:  . amLODipine  10 mg Oral Daily  . aspirin EC  81 mg Oral Daily  . calcitRIOL  0.25 mcg Oral Q M,W,F-HD  . calcium acetate  1,334 mg Oral TID WC  . carvedilol  25 mg Oral BID  . Chlorhexidine Gluconate Cloth  6 each Topical Q0600  . cinacalcet  60 mg Oral Once per day on Mon Wed Fri  . gabapentin  100 mg Oral QHS  . heparin  5,000 Units Subcutaneous Q8H  . hydrALAZINE  10 mg Oral Q8H  . levETIRAcetam  500 mg Oral BID  . levothyroxine  112 mcg Oral QAC breakfast  . multivitamin with minerals  1 tablet Oral Daily  . pantoprazole  40 mg Oral BID  . sodium chloride flush  3 mL Intravenous Q12H   Dialysis Orders: Establishing HD orders, prev on CCPD. EDW prior 50.5kg.  Assessment/Plan: 1. Uremia/vol overload/CCPD failure: Now transitioned back to HD. S/p HD #1 9/5, then 9/6, and again for HD 9/7. CLIP finalized, can start at [facility] MWF at [facility] on Mon 9/9. Will plan on PD cath removal as outpatient. 2. ESRD: HD today, then transitioning to MWF schedule.  3. HTN/volume: BP remains high with  ongoing volume excess - lowering with HD as tolerated. 4. Anemia: Hgb > 12. No ESA needed. 5. Secondary hyperparathyroidism: Ca ok now, Phos very high. Changing binder from Phoslo -> Renvela 3/meals. Continue PO calcitriol TIW and Sensipar. 6. Nutrition: Alb low, starting protein supps. 7. Hx CVA 8. Hx seizure d/o 9. Hx hypothyroidism 10. HD access: Looks like AVF being used again, will make sure HD lines are removed.  Veneta Penton, PA-C 03/12/2018, 10:09 AM  [facility] Pager: [phone]  Pt seen, examined and agree w A/P as above.  [name] [employer] pager [phone]   03/12/2018, 2:05 PM

## 2018-03-12 NOTE — Progress Notes (Addendum)
Physical Therapy Treatment Patient Details Name: Terry Juarez MRN: [id number] DOB: May 05, 1966 Today's Date: 03/12/2018    History of Present Illness Pt is a 52 y.o. F with significant PMh of ESRD on PD, hemorrhagic CVA with residual seizures on Keppra and LUE weakness, hypothryoidism, and hypertension who presents with leg swelling and dyspnea for the past 3 weeks.    PT Comments    Patient doing well with therapy today, reports she is feeling stronger and willing to ambulate down hallway at supervision level. Pt walking 120' with quad cane and noted decreased L foot clearance. Agree with OP PT recs and feel she will benefit once medially ready for d/c.    Follow Up Recommendations  Outpatient PT;Supervision for mobility/OOB ** [facility]**     Equipment Recommendations  3in1 commode    Recommendations for Other Services       Precautions / Restrictions Precautions Precautions: Fall Precaution Comments: watch BP  Restrictions Weight Bearing Restrictions: No    Mobility  Bed Mobility Overal bed mobility: Modified Independent             General bed mobility comments: Pt up in recliner upon arrival  Transfers Overall transfer level: Needs assistance Equipment used: None                Ambulation/Gait Ambulation/Gait assistance: Min guard Gait Distance (Feet): 120 Feet Assistive device: Quad cane Gait Pattern/deviations: Step-through pattern;Decreased step length - left Gait velocity: dereased   General Gait Details: ambulating with quad cane, L sided residual weakness with dereased left foot cleareance. discussed risk of fall and triping with patient   Marine [employer] [place] (Stroke Patients Only)       Balance Overall balance assessment: Mild deficits observed, not formally tested                                          Cognition Arousal/Alertness: Awake/alert Behavior During  Therapy: WFL for tasks assessed/performed Overall Cognitive Status: Within Functional Limits for tasks assessed                                        Exercises      General Comments        Pertinent Vitals/Pain Pain Assessment: No/denies pain    Home Living                      Prior Function            PT Goals (current goals can now be found in the care plan section) Acute Rehab PT Goals Patient Stated Goal: go to therapy PT Goal Formulation: With patient Time For Goal Achievement: 03/24/18 Potential to Achieve Goals: Good Progress towards PT goals: Progressing toward goals    Frequency    Min 4X/week      PT Plan Current plan remains appropriate    Co-evaluation              AM-PAC PT "6 Clicks" Daily Activity  Outcome Measure  Difficulty turning over in bed (including adjusting bedclothes, sheets and blankets)?: None Difficulty moving from lying on back to sitting on the side of the bed? : A  Little Difficulty sitting down on and standing up from a chair with arms (e.g., wheelchair, bedside commode, etc,.)?: A Little Help needed moving to and from a bed to chair (including a wheelchair)?: A Little Help needed walking in hospital room?: A Little Help needed climbing 3-5 steps with a railing? : A Lot 6 Click Score: 18    End of Session Equipment Utilized During Treatment: Gait belt Activity Tolerance: Patient tolerated treatment well Patient left: in chair;with call bell/phone within reach Nurse Communication: Mobility status PT Visit Diagnosis: Other abnormalities of gait and mobility (R26.89);Difficulty in walking, not elsewhere classified (R26.2)     Time: 1100-1120 PT Time Calculation (min) (ACUTE ONLY): 20 min  Charges:  $Gait Training: 8-22 mins                     Reinaldo Berber, PT, DPT Acute Rehabilitation Services Pager: [phone] Office: [phone]     Reinaldo Berber 03/12/2018, 11:33 AM

## 2018-03-12 NOTE — Progress Notes (Signed)
PROGRESS NOTE  Terry Juarez TIW:[id number] DOB: 1966-02-18 DOA: 03/09/2018 PCP: [name], MD  HPI/Recap of past 24 hours:  Patient is  To have another dialysis later today ,reports feeling better, able to breath better, less edema She denies pain, no fever  Assessment/Plan: Active Problems:   ESRD on peritoneal dialysis ([place])   Seizure disorder (HCC)   Hypothyroidism   Left spastic hemiparesis (HCC)   Volume overload   Peripheral edema   SOB (shortness of breath)   Uremia  ESRD -h/o RCC with r nephrectomy -presented with volume overload, uremia, ccpd failure --per nephrology "Noted with prior CVA admit she had Cardiac arrest with attempting to use her AVF ,she refuses to use it  and will allow a perm cath placement  by IR . -a non tunneled central venous catheter was placed  By IR on 9/5, she  is started on HD, today is day #2 dialysis, nephrology plan to do the third dialysis tomorrow - permanent dialysis access, outpatient HD per nephrology -Nephrology input appreciated  Loud precordial murmur Appear to be continuous Currently denies chest pain, she reports has chest pain initially on presentation Will get echocardiogram for further eval  HTN;  Blood pressure not well controlled on home meds norvasc and coreg Elevated blood pressure likely due to volume overload Continue getting HD  start low dose scheduled hydralazine She has h/o press syndrome due to uncontrolled bp, will need to continue titrate bp meds  H/o intracranial hemorrhage with Right posterior frontal lobe acute hemorrhage/ subarachnoid hemorrhage  in 2018 with left sided residual weakness, walks with a cane Fibromuscular dysplasia of bilateral ICAs She is on asa 81mg  at home which is continued  H/o Seizure disorder - on Keppra   Hypothyroidism - Continue home synthroid 112 mcg QD  Peripheral Neuropathy - Continue home gabapentin  Code Status: full  Family Communication: patient    Disposition Plan: home, hopefully tomorrow if echo result unremarkable needs to set up outpatient dialysis prior to discharge   Consultants:  IR  nephrology  Procedures: a non tunneled central venous catheter was placed  By IR on 9/5, Hemodialysis   Antibiotics:  none   Objective: BP (!) 144/81 (BP Location: Left Arm)   Pulse 84   Temp 98.2 F (36.8 C) (Oral)   Resp 18   Ht 5\' 5"  (1.651 m)   Wt 68 kg   SpO2 90%   BMI 24.95 kg/m   Intake/Output Summary (Last 24 hours) at 03/12/2018 1114 Last data filed at 03/12/2018 0900 Gross per 24 hour  Intake 280 ml  Output 2500 ml  Net -2220 ml   Filed Weights   03/10/18 2256 03/11/18 1122 03/11/18 1507  Weight: 70 kg 70 kg 68 kg    Exam: Patient is examined daily including today on 03/12/2018, exams remain the same as of yesterday except that has changed    General:  NAD, flat affect  Cardiovascular: RRR, continuous loud precordial murmur   Respiratory: diminished at basis  Abdomen: Soft/ND/NT, positive BS  Musculoskeletal: bilateral lower extremity pitting Edema  Neuro: alert, oriented , residual left side weakness  (left upper extremity flaccid, left lower extremity weak but able to lift against gravity)  Data Reviewed: Basic Metabolic Panel: Recent Labs  Lab 03/09/18 2012 03/09/18 2024 03/10/18 0415 03/11/18 1336  NA 144 139 146* 142  K 5.4* 5.4* 4.4 4.0  CL 103 109 105 102  CO2 17*  --  17* 21*  GLUCOSE 104*  97 93 94  BUN 101* 104* 102* 72*  CREATININE 19.77* >18.00* 19.28* 16.15*  CALCIUM 7.6*  --  7.7* 8.6*  PHOS  --   --   --  10.4*   Liver Function Tests: Recent Labs  Lab 03/09/18 2012 03/11/18 1336  AST 23  --   ALT 13  --   ALKPHOS 57  --   BILITOT 0.9  --   PROT 6.1*  --   ALBUMIN 3.0* 2.7*   No results for input(s): LIPASE, AMYLASE in the last 168 hours. No results for input(s): AMMONIA in the last 168 hours. CBC: Recent Labs  Lab 03/09/18 2012 03/09/18 2024 03/10/18 0415  03/11/18 1335  WBC 8.4  --  7.9 10.0  HGB 13.3 14.3 12.4 13.0  HCT 43.4 42.0 39.9 42.0  MCV 100.5*  --  96.4 97.2  PLT 252  --  241 271   Cardiac Enzymes:   No results for input(s): CKTOTAL, CKMB, CKMBINDEX, TROPONINI in the last 168 hours. BNP (last 3 results) No results for input(s): BNP in the last 8760 hours.  ProBNP (last 3 results) No results for input(s): PROBNP in the last 8760 hours.  CBG: No results for input(s): GLUCAP in the last 168 hours.  No results found for this or any previous visit (from the past 240 hour(s)).   Studies: No results found.  Scheduled Meds: . amLODipine  10 mg Oral Daily  . aspirin EC  81 mg Oral Daily  . calcitRIOL  0.25 mcg Oral Q M,W,F-HD  . carvedilol  25 mg Oral BID  . Chlorhexidine Gluconate Cloth  6 each Topical Q0600  . cinacalcet  60 mg Oral Once per day on Mon Wed Fri  . gabapentin  100 mg Oral QHS  . heparin  5,000 Units Subcutaneous Q8H  . hydrALAZINE  10 mg Oral Q8H  . levETIRAcetam  500 mg Oral BID  . levothyroxine  112 mcg Oral QAC breakfast  . multivitamin with minerals  1 tablet Oral Daily  . pantoprazole  40 mg Oral BID  . sevelamer carbonate  2,400 mg Oral TID WC  . sodium chloride flush  3 mL Intravenous Q12H    Continuous Infusions:   Time spent: 59mins, case discussed with nephrology I have personally reviewed and interpreted on  03/12/2018 daily labs, tele strips, imagings as discussed above under date review session and assessment and plans.  I reviewed all nursing notes, pharmacy notes, consultant notes,  vitals, pertinent old records  I have discussed plan of care as described above with RN , patient  on 03/12/2018   Florencia Reasons MD, PhD  [facility] Pager [phone]. If 7PM-7AM, please contact night-coverage at [email or web address], password [facility] 03/12/2018, 11:14 AM  LOS: 3 days

## 2018-03-12 NOTE — Care Management (Signed)
3n1 ordered from [facility].  Will be delivered to room 5M06.  Pt to d/c after dialysis.

## 2018-03-12 NOTE — Progress Notes (Signed)
Pt discharged at this time. Left IJ was removed 30-40 minutes ago. There is no drainage present at the site. Pt states "it feels okay, just a little sore." Pt refused all PM meds prior to discharge. Pt stated she had all of those meds at home. Vitals are WNL. Pt given discharge papers and all questions were answered. Pt taken to ride via wheelchair. Pt had BSC that was ordered and was sent with her, wedge pillow (hers already), and pt's cane.   Eleanora Neighbor, RN

## 2018-03-12 NOTE — Progress Notes (Signed)
SATURATION QUALIFICATIONS:  Patient Saturations on Room Air while Ambulating = 93%. Patient ambulated 50 feet with quad cane.  Dorthey Sawyer, RN

## 2018-03-12 NOTE — Progress Notes (Signed)
  Echocardiogram 2D Echocardiogram has been performed.  Darlina Sicilian M 03/12/2018, 12:06 PM

## 2018-03-13 DIAGNOSIS — N2581 Secondary hyperparathyroidism of renal origin: Secondary | ICD-10-CM | POA: Diagnosis not present

## 2018-03-13 DIAGNOSIS — R17 Unspecified jaundice: Secondary | ICD-10-CM | POA: Diagnosis not present

## 2018-03-13 DIAGNOSIS — Z5189 Encounter for other specified aftercare: Secondary | ICD-10-CM | POA: Diagnosis not present

## 2018-03-13 DIAGNOSIS — D509 Iron deficiency anemia, unspecified: Secondary | ICD-10-CM | POA: Diagnosis not present

## 2018-03-13 DIAGNOSIS — N186 End stage renal disease: Secondary | ICD-10-CM | POA: Diagnosis not present

## 2018-03-13 DIAGNOSIS — Z79899 Other long term (current) drug therapy: Secondary | ICD-10-CM | POA: Diagnosis not present

## 2018-03-14 DIAGNOSIS — R17 Unspecified jaundice: Secondary | ICD-10-CM | POA: Diagnosis not present

## 2018-03-14 DIAGNOSIS — D509 Iron deficiency anemia, unspecified: Secondary | ICD-10-CM | POA: Diagnosis not present

## 2018-03-14 DIAGNOSIS — Z79899 Other long term (current) drug therapy: Secondary | ICD-10-CM | POA: Diagnosis not present

## 2018-03-14 DIAGNOSIS — N2581 Secondary hyperparathyroidism of renal origin: Secondary | ICD-10-CM | POA: Diagnosis not present

## 2018-03-14 DIAGNOSIS — N186 End stage renal disease: Secondary | ICD-10-CM | POA: Diagnosis not present

## 2018-03-14 DIAGNOSIS — Z5189 Encounter for other specified aftercare: Secondary | ICD-10-CM | POA: Diagnosis not present

## 2018-03-14 DIAGNOSIS — E46 Unspecified protein-calorie malnutrition: Secondary | ICD-10-CM | POA: Insufficient documentation

## 2018-03-14 LAB — HEPATITIS B CORE ANTIBODY, TOTAL: Hep B Core Total Ab: NEGATIVE

## 2018-03-15 DIAGNOSIS — R17 Unspecified jaundice: Secondary | ICD-10-CM | POA: Diagnosis not present

## 2018-03-15 DIAGNOSIS — Z79899 Other long term (current) drug therapy: Secondary | ICD-10-CM | POA: Diagnosis not present

## 2018-03-15 DIAGNOSIS — N186 End stage renal disease: Secondary | ICD-10-CM | POA: Diagnosis not present

## 2018-03-15 DIAGNOSIS — Z5189 Encounter for other specified aftercare: Secondary | ICD-10-CM | POA: Diagnosis not present

## 2018-03-15 DIAGNOSIS — N2581 Secondary hyperparathyroidism of renal origin: Secondary | ICD-10-CM | POA: Diagnosis not present

## 2018-03-15 DIAGNOSIS — D509 Iron deficiency anemia, unspecified: Secondary | ICD-10-CM | POA: Diagnosis not present

## 2018-03-15 LAB — HEPATITIS B E ANTIBODY: Hep B E Ab: NEGATIVE

## 2018-03-16 DIAGNOSIS — Z5189 Encounter for other specified aftercare: Secondary | ICD-10-CM | POA: Diagnosis not present

## 2018-03-16 DIAGNOSIS — N2581 Secondary hyperparathyroidism of renal origin: Secondary | ICD-10-CM | POA: Diagnosis not present

## 2018-03-16 DIAGNOSIS — R17 Unspecified jaundice: Secondary | ICD-10-CM | POA: Diagnosis not present

## 2018-03-16 DIAGNOSIS — Z79899 Other long term (current) drug therapy: Secondary | ICD-10-CM | POA: Diagnosis not present

## 2018-03-16 DIAGNOSIS — N186 End stage renal disease: Secondary | ICD-10-CM | POA: Diagnosis not present

## 2018-03-16 DIAGNOSIS — D509 Iron deficiency anemia, unspecified: Secondary | ICD-10-CM | POA: Diagnosis not present

## 2018-03-17 DIAGNOSIS — D509 Iron deficiency anemia, unspecified: Secondary | ICD-10-CM | POA: Diagnosis not present

## 2018-03-17 DIAGNOSIS — R17 Unspecified jaundice: Secondary | ICD-10-CM | POA: Diagnosis not present

## 2018-03-17 DIAGNOSIS — Z5189 Encounter for other specified aftercare: Secondary | ICD-10-CM | POA: Diagnosis not present

## 2018-03-17 DIAGNOSIS — Z79899 Other long term (current) drug therapy: Secondary | ICD-10-CM | POA: Diagnosis not present

## 2018-03-17 DIAGNOSIS — N2581 Secondary hyperparathyroidism of renal origin: Secondary | ICD-10-CM | POA: Diagnosis not present

## 2018-03-17 DIAGNOSIS — N186 End stage renal disease: Secondary | ICD-10-CM | POA: Diagnosis not present

## 2018-03-18 DIAGNOSIS — Z79899 Other long term (current) drug therapy: Secondary | ICD-10-CM | POA: Diagnosis not present

## 2018-03-18 DIAGNOSIS — N2581 Secondary hyperparathyroidism of renal origin: Secondary | ICD-10-CM | POA: Diagnosis not present

## 2018-03-18 DIAGNOSIS — Z5189 Encounter for other specified aftercare: Secondary | ICD-10-CM | POA: Diagnosis not present

## 2018-03-18 DIAGNOSIS — R17 Unspecified jaundice: Secondary | ICD-10-CM | POA: Diagnosis not present

## 2018-03-18 DIAGNOSIS — D509 Iron deficiency anemia, unspecified: Secondary | ICD-10-CM | POA: Diagnosis not present

## 2018-03-18 DIAGNOSIS — N186 End stage renal disease: Secondary | ICD-10-CM | POA: Diagnosis not present

## 2018-03-19 DIAGNOSIS — N186 End stage renal disease: Secondary | ICD-10-CM | POA: Diagnosis not present

## 2018-03-19 DIAGNOSIS — Z79899 Other long term (current) drug therapy: Secondary | ICD-10-CM | POA: Diagnosis not present

## 2018-03-19 DIAGNOSIS — Z5189 Encounter for other specified aftercare: Secondary | ICD-10-CM | POA: Diagnosis not present

## 2018-03-19 DIAGNOSIS — D509 Iron deficiency anemia, unspecified: Secondary | ICD-10-CM | POA: Diagnosis not present

## 2018-03-19 DIAGNOSIS — R17 Unspecified jaundice: Secondary | ICD-10-CM | POA: Diagnosis not present

## 2018-03-19 DIAGNOSIS — N2581 Secondary hyperparathyroidism of renal origin: Secondary | ICD-10-CM | POA: Diagnosis not present

## 2018-03-20 DIAGNOSIS — N186 End stage renal disease: Secondary | ICD-10-CM | POA: Diagnosis not present

## 2018-03-20 DIAGNOSIS — D509 Iron deficiency anemia, unspecified: Secondary | ICD-10-CM | POA: Diagnosis not present

## 2018-03-20 DIAGNOSIS — R17 Unspecified jaundice: Secondary | ICD-10-CM | POA: Diagnosis not present

## 2018-03-20 DIAGNOSIS — Z5189 Encounter for other specified aftercare: Secondary | ICD-10-CM | POA: Diagnosis not present

## 2018-03-20 DIAGNOSIS — N2581 Secondary hyperparathyroidism of renal origin: Secondary | ICD-10-CM | POA: Diagnosis not present

## 2018-03-20 DIAGNOSIS — Z79899 Other long term (current) drug therapy: Secondary | ICD-10-CM | POA: Diagnosis not present

## 2018-03-21 DIAGNOSIS — D509 Iron deficiency anemia, unspecified: Secondary | ICD-10-CM | POA: Diagnosis not present

## 2018-03-21 DIAGNOSIS — N186 End stage renal disease: Secondary | ICD-10-CM | POA: Diagnosis not present

## 2018-03-21 DIAGNOSIS — Z5189 Encounter for other specified aftercare: Secondary | ICD-10-CM | POA: Diagnosis not present

## 2018-03-21 DIAGNOSIS — Z79899 Other long term (current) drug therapy: Secondary | ICD-10-CM | POA: Diagnosis not present

## 2018-03-21 DIAGNOSIS — N2581 Secondary hyperparathyroidism of renal origin: Secondary | ICD-10-CM | POA: Diagnosis not present

## 2018-03-21 DIAGNOSIS — R17 Unspecified jaundice: Secondary | ICD-10-CM | POA: Diagnosis not present

## 2018-03-22 DIAGNOSIS — D509 Iron deficiency anemia, unspecified: Secondary | ICD-10-CM | POA: Diagnosis not present

## 2018-03-22 DIAGNOSIS — N186 End stage renal disease: Secondary | ICD-10-CM | POA: Diagnosis not present

## 2018-03-22 DIAGNOSIS — Z5189 Encounter for other specified aftercare: Secondary | ICD-10-CM | POA: Diagnosis not present

## 2018-03-22 DIAGNOSIS — Z79899 Other long term (current) drug therapy: Secondary | ICD-10-CM | POA: Diagnosis not present

## 2018-03-22 DIAGNOSIS — N2581 Secondary hyperparathyroidism of renal origin: Secondary | ICD-10-CM | POA: Diagnosis not present

## 2018-03-22 DIAGNOSIS — R17 Unspecified jaundice: Secondary | ICD-10-CM | POA: Diagnosis not present

## 2018-03-23 DIAGNOSIS — N186 End stage renal disease: Secondary | ICD-10-CM | POA: Diagnosis not present

## 2018-03-23 DIAGNOSIS — Z79899 Other long term (current) drug therapy: Secondary | ICD-10-CM | POA: Diagnosis not present

## 2018-03-23 DIAGNOSIS — Z5189 Encounter for other specified aftercare: Secondary | ICD-10-CM | POA: Diagnosis not present

## 2018-03-23 DIAGNOSIS — N2581 Secondary hyperparathyroidism of renal origin: Secondary | ICD-10-CM | POA: Diagnosis not present

## 2018-03-23 DIAGNOSIS — R17 Unspecified jaundice: Secondary | ICD-10-CM | POA: Diagnosis not present

## 2018-03-23 DIAGNOSIS — D509 Iron deficiency anemia, unspecified: Secondary | ICD-10-CM | POA: Diagnosis not present

## 2018-03-24 DIAGNOSIS — Z5189 Encounter for other specified aftercare: Secondary | ICD-10-CM | POA: Diagnosis not present

## 2018-03-24 DIAGNOSIS — N186 End stage renal disease: Secondary | ICD-10-CM | POA: Diagnosis not present

## 2018-03-24 DIAGNOSIS — N2581 Secondary hyperparathyroidism of renal origin: Secondary | ICD-10-CM | POA: Diagnosis not present

## 2018-03-24 DIAGNOSIS — D509 Iron deficiency anemia, unspecified: Secondary | ICD-10-CM | POA: Diagnosis not present

## 2018-03-24 DIAGNOSIS — Z79899 Other long term (current) drug therapy: Secondary | ICD-10-CM | POA: Diagnosis not present

## 2018-03-24 DIAGNOSIS — R17 Unspecified jaundice: Secondary | ICD-10-CM | POA: Diagnosis not present

## 2018-03-25 DIAGNOSIS — Z79899 Other long term (current) drug therapy: Secondary | ICD-10-CM | POA: Diagnosis not present

## 2018-03-25 DIAGNOSIS — Z5189 Encounter for other specified aftercare: Secondary | ICD-10-CM | POA: Diagnosis not present

## 2018-03-25 DIAGNOSIS — D509 Iron deficiency anemia, unspecified: Secondary | ICD-10-CM | POA: Diagnosis not present

## 2018-03-25 DIAGNOSIS — N186 End stage renal disease: Secondary | ICD-10-CM | POA: Diagnosis not present

## 2018-03-25 DIAGNOSIS — N2581 Secondary hyperparathyroidism of renal origin: Secondary | ICD-10-CM | POA: Diagnosis not present

## 2018-03-25 DIAGNOSIS — R17 Unspecified jaundice: Secondary | ICD-10-CM | POA: Diagnosis not present

## 2018-03-26 DIAGNOSIS — D509 Iron deficiency anemia, unspecified: Secondary | ICD-10-CM | POA: Diagnosis not present

## 2018-03-26 DIAGNOSIS — N2581 Secondary hyperparathyroidism of renal origin: Secondary | ICD-10-CM | POA: Diagnosis not present

## 2018-03-26 DIAGNOSIS — Z79899 Other long term (current) drug therapy: Secondary | ICD-10-CM | POA: Diagnosis not present

## 2018-03-26 DIAGNOSIS — Z5189 Encounter for other specified aftercare: Secondary | ICD-10-CM | POA: Diagnosis not present

## 2018-03-26 DIAGNOSIS — N186 End stage renal disease: Secondary | ICD-10-CM | POA: Diagnosis not present

## 2018-03-26 DIAGNOSIS — R17 Unspecified jaundice: Secondary | ICD-10-CM | POA: Diagnosis not present

## 2018-03-27 DIAGNOSIS — R17 Unspecified jaundice: Secondary | ICD-10-CM | POA: Diagnosis not present

## 2018-03-27 DIAGNOSIS — N2581 Secondary hyperparathyroidism of renal origin: Secondary | ICD-10-CM | POA: Diagnosis not present

## 2018-03-27 DIAGNOSIS — Z79899 Other long term (current) drug therapy: Secondary | ICD-10-CM | POA: Diagnosis not present

## 2018-03-27 DIAGNOSIS — N186 End stage renal disease: Secondary | ICD-10-CM | POA: Diagnosis not present

## 2018-03-27 DIAGNOSIS — D509 Iron deficiency anemia, unspecified: Secondary | ICD-10-CM | POA: Diagnosis not present

## 2018-03-27 DIAGNOSIS — Z5189 Encounter for other specified aftercare: Secondary | ICD-10-CM | POA: Diagnosis not present

## 2018-03-28 DIAGNOSIS — Z79899 Other long term (current) drug therapy: Secondary | ICD-10-CM | POA: Diagnosis not present

## 2018-03-28 DIAGNOSIS — N2581 Secondary hyperparathyroidism of renal origin: Secondary | ICD-10-CM | POA: Diagnosis not present

## 2018-03-28 DIAGNOSIS — R17 Unspecified jaundice: Secondary | ICD-10-CM | POA: Diagnosis not present

## 2018-03-28 DIAGNOSIS — Z5189 Encounter for other specified aftercare: Secondary | ICD-10-CM | POA: Diagnosis not present

## 2018-03-28 DIAGNOSIS — D509 Iron deficiency anemia, unspecified: Secondary | ICD-10-CM | POA: Diagnosis not present

## 2018-03-28 DIAGNOSIS — N186 End stage renal disease: Secondary | ICD-10-CM | POA: Diagnosis not present

## 2018-03-29 DIAGNOSIS — N2581 Secondary hyperparathyroidism of renal origin: Secondary | ICD-10-CM | POA: Diagnosis not present

## 2018-03-29 DIAGNOSIS — D509 Iron deficiency anemia, unspecified: Secondary | ICD-10-CM | POA: Diagnosis not present

## 2018-03-29 DIAGNOSIS — R17 Unspecified jaundice: Secondary | ICD-10-CM | POA: Diagnosis not present

## 2018-03-29 DIAGNOSIS — Z79899 Other long term (current) drug therapy: Secondary | ICD-10-CM | POA: Diagnosis not present

## 2018-03-29 DIAGNOSIS — N186 End stage renal disease: Secondary | ICD-10-CM | POA: Diagnosis not present

## 2018-03-29 DIAGNOSIS — Z5189 Encounter for other specified aftercare: Secondary | ICD-10-CM | POA: Diagnosis not present

## 2018-03-30 DIAGNOSIS — N186 End stage renal disease: Secondary | ICD-10-CM | POA: Diagnosis not present

## 2018-03-30 DIAGNOSIS — R17 Unspecified jaundice: Secondary | ICD-10-CM | POA: Diagnosis not present

## 2018-03-30 DIAGNOSIS — D509 Iron deficiency anemia, unspecified: Secondary | ICD-10-CM | POA: Diagnosis not present

## 2018-03-30 DIAGNOSIS — Z79899 Other long term (current) drug therapy: Secondary | ICD-10-CM | POA: Diagnosis not present

## 2018-03-30 DIAGNOSIS — Z5189 Encounter for other specified aftercare: Secondary | ICD-10-CM | POA: Diagnosis not present

## 2018-03-30 DIAGNOSIS — N2581 Secondary hyperparathyroidism of renal origin: Secondary | ICD-10-CM | POA: Diagnosis not present

## 2018-03-31 DIAGNOSIS — N186 End stage renal disease: Secondary | ICD-10-CM | POA: Diagnosis not present

## 2018-03-31 DIAGNOSIS — R17 Unspecified jaundice: Secondary | ICD-10-CM | POA: Diagnosis not present

## 2018-03-31 DIAGNOSIS — D509 Iron deficiency anemia, unspecified: Secondary | ICD-10-CM | POA: Diagnosis not present

## 2018-03-31 DIAGNOSIS — Z79899 Other long term (current) drug therapy: Secondary | ICD-10-CM | POA: Diagnosis not present

## 2018-03-31 DIAGNOSIS — N2581 Secondary hyperparathyroidism of renal origin: Secondary | ICD-10-CM | POA: Diagnosis not present

## 2018-03-31 DIAGNOSIS — Z5189 Encounter for other specified aftercare: Secondary | ICD-10-CM | POA: Diagnosis not present

## 2018-04-01 DIAGNOSIS — N2581 Secondary hyperparathyroidism of renal origin: Secondary | ICD-10-CM | POA: Diagnosis not present

## 2018-04-01 DIAGNOSIS — Z5189 Encounter for other specified aftercare: Secondary | ICD-10-CM | POA: Diagnosis not present

## 2018-04-01 DIAGNOSIS — R17 Unspecified jaundice: Secondary | ICD-10-CM | POA: Diagnosis not present

## 2018-04-01 DIAGNOSIS — Z79899 Other long term (current) drug therapy: Secondary | ICD-10-CM | POA: Diagnosis not present

## 2018-04-01 DIAGNOSIS — D509 Iron deficiency anemia, unspecified: Secondary | ICD-10-CM | POA: Diagnosis not present

## 2018-04-01 DIAGNOSIS — N186 End stage renal disease: Secondary | ICD-10-CM | POA: Diagnosis not present

## 2018-04-02 DIAGNOSIS — R17 Unspecified jaundice: Secondary | ICD-10-CM | POA: Diagnosis not present

## 2018-04-02 DIAGNOSIS — D509 Iron deficiency anemia, unspecified: Secondary | ICD-10-CM | POA: Diagnosis not present

## 2018-04-02 DIAGNOSIS — Z5189 Encounter for other specified aftercare: Secondary | ICD-10-CM | POA: Diagnosis not present

## 2018-04-02 DIAGNOSIS — N2581 Secondary hyperparathyroidism of renal origin: Secondary | ICD-10-CM | POA: Diagnosis not present

## 2018-04-02 DIAGNOSIS — Z79899 Other long term (current) drug therapy: Secondary | ICD-10-CM | POA: Diagnosis not present

## 2018-04-02 DIAGNOSIS — N186 End stage renal disease: Secondary | ICD-10-CM | POA: Diagnosis not present

## 2018-04-03 DIAGNOSIS — D509 Iron deficiency anemia, unspecified: Secondary | ICD-10-CM | POA: Diagnosis not present

## 2018-04-03 DIAGNOSIS — Z5189 Encounter for other specified aftercare: Secondary | ICD-10-CM | POA: Diagnosis not present

## 2018-04-03 DIAGNOSIS — N186 End stage renal disease: Secondary | ICD-10-CM | POA: Diagnosis not present

## 2018-04-03 DIAGNOSIS — Z79899 Other long term (current) drug therapy: Secondary | ICD-10-CM | POA: Diagnosis not present

## 2018-04-03 DIAGNOSIS — N2581 Secondary hyperparathyroidism of renal origin: Secondary | ICD-10-CM | POA: Diagnosis not present

## 2018-04-03 DIAGNOSIS — R17 Unspecified jaundice: Secondary | ICD-10-CM | POA: Diagnosis not present

## 2018-04-04 DIAGNOSIS — N2581 Secondary hyperparathyroidism of renal origin: Secondary | ICD-10-CM | POA: Diagnosis not present

## 2018-04-04 DIAGNOSIS — Z992 Dependence on renal dialysis: Secondary | ICD-10-CM | POA: Diagnosis not present

## 2018-04-04 DIAGNOSIS — R17 Unspecified jaundice: Secondary | ICD-10-CM | POA: Diagnosis not present

## 2018-04-04 DIAGNOSIS — Z79899 Other long term (current) drug therapy: Secondary | ICD-10-CM | POA: Diagnosis not present

## 2018-04-04 DIAGNOSIS — N041 Nephrotic syndrome with focal and segmental glomerular lesions: Secondary | ICD-10-CM | POA: Diagnosis not present

## 2018-04-04 DIAGNOSIS — D509 Iron deficiency anemia, unspecified: Secondary | ICD-10-CM | POA: Diagnosis not present

## 2018-04-04 DIAGNOSIS — N186 End stage renal disease: Secondary | ICD-10-CM | POA: Diagnosis not present

## 2018-04-04 DIAGNOSIS — Z5189 Encounter for other specified aftercare: Secondary | ICD-10-CM | POA: Diagnosis not present

## 2018-04-05 DIAGNOSIS — E440 Moderate protein-calorie malnutrition: Secondary | ICD-10-CM | POA: Diagnosis not present

## 2018-04-05 DIAGNOSIS — N041 Nephrotic syndrome with focal and segmental glomerular lesions: Secondary | ICD-10-CM | POA: Diagnosis not present

## 2018-04-05 DIAGNOSIS — Z4931 Encounter for adequacy testing for hemodialysis: Secondary | ICD-10-CM | POA: Diagnosis not present

## 2018-04-05 DIAGNOSIS — Z79899 Other long term (current) drug therapy: Secondary | ICD-10-CM | POA: Diagnosis not present

## 2018-04-05 DIAGNOSIS — Z992 Dependence on renal dialysis: Secondary | ICD-10-CM | POA: Diagnosis not present

## 2018-04-05 DIAGNOSIS — N186 End stage renal disease: Secondary | ICD-10-CM | POA: Diagnosis not present

## 2018-04-05 DIAGNOSIS — N2581 Secondary hyperparathyroidism of renal origin: Secondary | ICD-10-CM | POA: Diagnosis not present

## 2018-04-05 DIAGNOSIS — D509 Iron deficiency anemia, unspecified: Secondary | ICD-10-CM | POA: Diagnosis not present

## 2018-04-05 DIAGNOSIS — D631 Anemia in chronic kidney disease: Secondary | ICD-10-CM | POA: Diagnosis not present

## 2018-04-06 DIAGNOSIS — Z23 Encounter for immunization: Secondary | ICD-10-CM | POA: Diagnosis not present

## 2018-04-06 DIAGNOSIS — D631 Anemia in chronic kidney disease: Secondary | ICD-10-CM | POA: Diagnosis not present

## 2018-04-06 DIAGNOSIS — Z5189 Encounter for other specified aftercare: Secondary | ICD-10-CM | POA: Diagnosis not present

## 2018-04-06 DIAGNOSIS — Z4931 Encounter for adequacy testing for hemodialysis: Secondary | ICD-10-CM | POA: Insufficient documentation

## 2018-04-06 DIAGNOSIS — N2581 Secondary hyperparathyroidism of renal origin: Secondary | ICD-10-CM | POA: Diagnosis not present

## 2018-04-06 DIAGNOSIS — R17 Unspecified jaundice: Secondary | ICD-10-CM | POA: Diagnosis not present

## 2018-04-06 DIAGNOSIS — N186 End stage renal disease: Secondary | ICD-10-CM | POA: Diagnosis not present

## 2018-04-06 DIAGNOSIS — N2589 Other disorders resulting from impaired renal tubular function: Secondary | ICD-10-CM | POA: Diagnosis not present

## 2018-04-06 DIAGNOSIS — Z79899 Other long term (current) drug therapy: Secondary | ICD-10-CM | POA: Diagnosis not present

## 2018-04-06 DIAGNOSIS — D509 Iron deficiency anemia, unspecified: Secondary | ICD-10-CM | POA: Diagnosis not present

## 2018-04-06 DIAGNOSIS — E440 Moderate protein-calorie malnutrition: Secondary | ICD-10-CM | POA: Diagnosis not present

## 2018-04-08 DIAGNOSIS — D509 Iron deficiency anemia, unspecified: Secondary | ICD-10-CM | POA: Diagnosis not present

## 2018-04-08 DIAGNOSIS — E440 Moderate protein-calorie malnutrition: Secondary | ICD-10-CM | POA: Diagnosis not present

## 2018-04-08 DIAGNOSIS — N186 End stage renal disease: Secondary | ICD-10-CM | POA: Diagnosis not present

## 2018-04-08 DIAGNOSIS — D631 Anemia in chronic kidney disease: Secondary | ICD-10-CM | POA: Diagnosis not present

## 2018-04-08 DIAGNOSIS — Z4931 Encounter for adequacy testing for hemodialysis: Secondary | ICD-10-CM | POA: Diagnosis not present

## 2018-04-08 DIAGNOSIS — Z79899 Other long term (current) drug therapy: Secondary | ICD-10-CM | POA: Diagnosis not present

## 2018-04-09 DIAGNOSIS — D631 Anemia in chronic kidney disease: Secondary | ICD-10-CM | POA: Diagnosis not present

## 2018-04-09 DIAGNOSIS — Z4931 Encounter for adequacy testing for hemodialysis: Secondary | ICD-10-CM | POA: Diagnosis not present

## 2018-04-09 DIAGNOSIS — E440 Moderate protein-calorie malnutrition: Secondary | ICD-10-CM | POA: Diagnosis not present

## 2018-04-09 DIAGNOSIS — N186 End stage renal disease: Secondary | ICD-10-CM | POA: Diagnosis not present

## 2018-04-09 DIAGNOSIS — Z79899 Other long term (current) drug therapy: Secondary | ICD-10-CM | POA: Diagnosis not present

## 2018-04-09 DIAGNOSIS — D509 Iron deficiency anemia, unspecified: Secondary | ICD-10-CM | POA: Diagnosis not present

## 2018-04-11 DIAGNOSIS — D631 Anemia in chronic kidney disease: Secondary | ICD-10-CM | POA: Diagnosis not present

## 2018-04-11 DIAGNOSIS — D509 Iron deficiency anemia, unspecified: Secondary | ICD-10-CM | POA: Diagnosis not present

## 2018-04-11 DIAGNOSIS — E890 Postprocedural hypothyroidism: Secondary | ICD-10-CM | POA: Diagnosis not present

## 2018-04-11 DIAGNOSIS — N186 End stage renal disease: Secondary | ICD-10-CM | POA: Diagnosis not present

## 2018-04-11 DIAGNOSIS — E440 Moderate protein-calorie malnutrition: Secondary | ICD-10-CM | POA: Diagnosis not present

## 2018-04-11 DIAGNOSIS — E7849 Other hyperlipidemia: Secondary | ICD-10-CM | POA: Diagnosis not present

## 2018-04-11 DIAGNOSIS — R82998 Other abnormal findings in urine: Secondary | ICD-10-CM | POA: Diagnosis not present

## 2018-04-11 DIAGNOSIS — Z79899 Other long term (current) drug therapy: Secondary | ICD-10-CM | POA: Diagnosis not present

## 2018-04-11 DIAGNOSIS — Z4931 Encounter for adequacy testing for hemodialysis: Secondary | ICD-10-CM | POA: Diagnosis not present

## 2018-04-13 DIAGNOSIS — D631 Anemia in chronic kidney disease: Secondary | ICD-10-CM | POA: Diagnosis not present

## 2018-04-13 DIAGNOSIS — E440 Moderate protein-calorie malnutrition: Secondary | ICD-10-CM | POA: Diagnosis not present

## 2018-04-13 DIAGNOSIS — Z4931 Encounter for adequacy testing for hemodialysis: Secondary | ICD-10-CM | POA: Diagnosis not present

## 2018-04-13 DIAGNOSIS — D509 Iron deficiency anemia, unspecified: Secondary | ICD-10-CM | POA: Diagnosis not present

## 2018-04-13 DIAGNOSIS — Z79899 Other long term (current) drug therapy: Secondary | ICD-10-CM | POA: Diagnosis not present

## 2018-04-13 DIAGNOSIS — N186 End stage renal disease: Secondary | ICD-10-CM | POA: Diagnosis not present

## 2018-04-15 DIAGNOSIS — E440 Moderate protein-calorie malnutrition: Secondary | ICD-10-CM | POA: Diagnosis not present

## 2018-04-15 DIAGNOSIS — D631 Anemia in chronic kidney disease: Secondary | ICD-10-CM | POA: Diagnosis not present

## 2018-04-15 DIAGNOSIS — N186 End stage renal disease: Secondary | ICD-10-CM | POA: Diagnosis not present

## 2018-04-15 DIAGNOSIS — Z4931 Encounter for adequacy testing for hemodialysis: Secondary | ICD-10-CM | POA: Diagnosis not present

## 2018-04-15 DIAGNOSIS — Z992 Dependence on renal dialysis: Secondary | ICD-10-CM | POA: Diagnosis not present

## 2018-04-15 DIAGNOSIS — Z79899 Other long term (current) drug therapy: Secondary | ICD-10-CM | POA: Diagnosis not present

## 2018-04-15 DIAGNOSIS — D509 Iron deficiency anemia, unspecified: Secondary | ICD-10-CM | POA: Diagnosis not present

## 2018-04-15 DIAGNOSIS — I871 Compression of vein: Secondary | ICD-10-CM | POA: Diagnosis not present

## 2018-04-18 DIAGNOSIS — D631 Anemia in chronic kidney disease: Secondary | ICD-10-CM | POA: Diagnosis not present

## 2018-04-18 DIAGNOSIS — N186 End stage renal disease: Secondary | ICD-10-CM | POA: Diagnosis not present

## 2018-04-18 DIAGNOSIS — Z4931 Encounter for adequacy testing for hemodialysis: Secondary | ICD-10-CM | POA: Diagnosis not present

## 2018-04-18 DIAGNOSIS — Z79899 Other long term (current) drug therapy: Secondary | ICD-10-CM | POA: Diagnosis not present

## 2018-04-18 DIAGNOSIS — D509 Iron deficiency anemia, unspecified: Secondary | ICD-10-CM | POA: Diagnosis not present

## 2018-04-18 DIAGNOSIS — E440 Moderate protein-calorie malnutrition: Secondary | ICD-10-CM | POA: Diagnosis not present

## 2018-04-20 DIAGNOSIS — N186 End stage renal disease: Secondary | ICD-10-CM | POA: Diagnosis not present

## 2018-04-20 DIAGNOSIS — D509 Iron deficiency anemia, unspecified: Secondary | ICD-10-CM | POA: Diagnosis not present

## 2018-04-20 DIAGNOSIS — Z79899 Other long term (current) drug therapy: Secondary | ICD-10-CM | POA: Diagnosis not present

## 2018-04-20 DIAGNOSIS — D631 Anemia in chronic kidney disease: Secondary | ICD-10-CM | POA: Diagnosis not present

## 2018-04-20 DIAGNOSIS — E440 Moderate protein-calorie malnutrition: Secondary | ICD-10-CM | POA: Diagnosis not present

## 2018-04-20 DIAGNOSIS — Z4931 Encounter for adequacy testing for hemodialysis: Secondary | ICD-10-CM | POA: Diagnosis not present

## 2018-04-22 DIAGNOSIS — D509 Iron deficiency anemia, unspecified: Secondary | ICD-10-CM | POA: Diagnosis not present

## 2018-04-22 DIAGNOSIS — N186 End stage renal disease: Secondary | ICD-10-CM | POA: Diagnosis not present

## 2018-04-22 DIAGNOSIS — Z4931 Encounter for adequacy testing for hemodialysis: Secondary | ICD-10-CM | POA: Diagnosis not present

## 2018-04-22 DIAGNOSIS — Z79899 Other long term (current) drug therapy: Secondary | ICD-10-CM | POA: Diagnosis not present

## 2018-04-22 DIAGNOSIS — D631 Anemia in chronic kidney disease: Secondary | ICD-10-CM | POA: Diagnosis not present

## 2018-04-22 DIAGNOSIS — E440 Moderate protein-calorie malnutrition: Secondary | ICD-10-CM | POA: Diagnosis not present

## 2018-04-23 DIAGNOSIS — Z79899 Other long term (current) drug therapy: Secondary | ICD-10-CM | POA: Diagnosis not present

## 2018-04-23 DIAGNOSIS — E440 Moderate protein-calorie malnutrition: Secondary | ICD-10-CM | POA: Diagnosis not present

## 2018-04-23 DIAGNOSIS — D509 Iron deficiency anemia, unspecified: Secondary | ICD-10-CM | POA: Diagnosis not present

## 2018-04-23 DIAGNOSIS — D631 Anemia in chronic kidney disease: Secondary | ICD-10-CM | POA: Diagnosis not present

## 2018-04-23 DIAGNOSIS — N186 End stage renal disease: Secondary | ICD-10-CM | POA: Diagnosis not present

## 2018-04-23 DIAGNOSIS — Z4931 Encounter for adequacy testing for hemodialysis: Secondary | ICD-10-CM | POA: Diagnosis not present

## 2018-04-25 DIAGNOSIS — E440 Moderate protein-calorie malnutrition: Secondary | ICD-10-CM | POA: Diagnosis not present

## 2018-04-25 DIAGNOSIS — N186 End stage renal disease: Secondary | ICD-10-CM | POA: Diagnosis not present

## 2018-04-25 DIAGNOSIS — D509 Iron deficiency anemia, unspecified: Secondary | ICD-10-CM | POA: Diagnosis not present

## 2018-04-25 DIAGNOSIS — Z4931 Encounter for adequacy testing for hemodialysis: Secondary | ICD-10-CM | POA: Diagnosis not present

## 2018-04-25 DIAGNOSIS — Z79899 Other long term (current) drug therapy: Secondary | ICD-10-CM | POA: Diagnosis not present

## 2018-04-25 DIAGNOSIS — D631 Anemia in chronic kidney disease: Secondary | ICD-10-CM | POA: Diagnosis not present

## 2018-04-27 DIAGNOSIS — Z79899 Other long term (current) drug therapy: Secondary | ICD-10-CM | POA: Diagnosis not present

## 2018-04-27 DIAGNOSIS — D509 Iron deficiency anemia, unspecified: Secondary | ICD-10-CM | POA: Diagnosis not present

## 2018-04-27 DIAGNOSIS — N186 End stage renal disease: Secondary | ICD-10-CM | POA: Diagnosis not present

## 2018-04-27 DIAGNOSIS — D631 Anemia in chronic kidney disease: Secondary | ICD-10-CM | POA: Diagnosis not present

## 2018-04-27 DIAGNOSIS — E440 Moderate protein-calorie malnutrition: Secondary | ICD-10-CM | POA: Diagnosis not present

## 2018-04-27 DIAGNOSIS — Z4931 Encounter for adequacy testing for hemodialysis: Secondary | ICD-10-CM | POA: Diagnosis not present

## 2018-04-29 DIAGNOSIS — D631 Anemia in chronic kidney disease: Secondary | ICD-10-CM | POA: Diagnosis not present

## 2018-04-29 DIAGNOSIS — E440 Moderate protein-calorie malnutrition: Secondary | ICD-10-CM | POA: Diagnosis not present

## 2018-04-29 DIAGNOSIS — Z4931 Encounter for adequacy testing for hemodialysis: Secondary | ICD-10-CM | POA: Diagnosis not present

## 2018-04-29 DIAGNOSIS — N186 End stage renal disease: Secondary | ICD-10-CM | POA: Diagnosis not present

## 2018-04-29 DIAGNOSIS — D509 Iron deficiency anemia, unspecified: Secondary | ICD-10-CM | POA: Diagnosis not present

## 2018-04-29 DIAGNOSIS — Z79899 Other long term (current) drug therapy: Secondary | ICD-10-CM | POA: Diagnosis not present

## 2018-05-02 DIAGNOSIS — E440 Moderate protein-calorie malnutrition: Secondary | ICD-10-CM | POA: Diagnosis not present

## 2018-05-02 DIAGNOSIS — D631 Anemia in chronic kidney disease: Secondary | ICD-10-CM | POA: Diagnosis not present

## 2018-05-02 DIAGNOSIS — Z4931 Encounter for adequacy testing for hemodialysis: Secondary | ICD-10-CM | POA: Diagnosis not present

## 2018-05-02 DIAGNOSIS — N186 End stage renal disease: Secondary | ICD-10-CM | POA: Diagnosis not present

## 2018-05-02 DIAGNOSIS — D509 Iron deficiency anemia, unspecified: Secondary | ICD-10-CM | POA: Diagnosis not present

## 2018-05-02 DIAGNOSIS — Z79899 Other long term (current) drug therapy: Secondary | ICD-10-CM | POA: Diagnosis not present

## 2018-05-04 DIAGNOSIS — E440 Moderate protein-calorie malnutrition: Secondary | ICD-10-CM | POA: Diagnosis not present

## 2018-05-04 DIAGNOSIS — Z4931 Encounter for adequacy testing for hemodialysis: Secondary | ICD-10-CM | POA: Diagnosis not present

## 2018-05-04 DIAGNOSIS — Z79899 Other long term (current) drug therapy: Secondary | ICD-10-CM | POA: Diagnosis not present

## 2018-05-04 DIAGNOSIS — D631 Anemia in chronic kidney disease: Secondary | ICD-10-CM | POA: Diagnosis not present

## 2018-05-04 DIAGNOSIS — N186 End stage renal disease: Secondary | ICD-10-CM | POA: Diagnosis not present

## 2018-05-04 DIAGNOSIS — D509 Iron deficiency anemia, unspecified: Secondary | ICD-10-CM | POA: Diagnosis not present

## 2018-05-06 DIAGNOSIS — Z79899 Other long term (current) drug therapy: Secondary | ICD-10-CM | POA: Diagnosis not present

## 2018-05-06 DIAGNOSIS — E440 Moderate protein-calorie malnutrition: Secondary | ICD-10-CM | POA: Diagnosis not present

## 2018-05-06 DIAGNOSIS — E875 Hyperkalemia: Secondary | ICD-10-CM | POA: Diagnosis not present

## 2018-05-06 DIAGNOSIS — D631 Anemia in chronic kidney disease: Secondary | ICD-10-CM | POA: Diagnosis not present

## 2018-05-06 DIAGNOSIS — N041 Nephrotic syndrome with focal and segmental glomerular lesions: Secondary | ICD-10-CM | POA: Diagnosis not present

## 2018-05-06 DIAGNOSIS — D509 Iron deficiency anemia, unspecified: Secondary | ICD-10-CM | POA: Diagnosis not present

## 2018-05-06 DIAGNOSIS — Z5189 Encounter for other specified aftercare: Secondary | ICD-10-CM | POA: Diagnosis not present

## 2018-05-06 DIAGNOSIS — R17 Unspecified jaundice: Secondary | ICD-10-CM | POA: Diagnosis not present

## 2018-05-06 DIAGNOSIS — Z992 Dependence on renal dialysis: Secondary | ICD-10-CM | POA: Diagnosis not present

## 2018-05-06 DIAGNOSIS — N186 End stage renal disease: Secondary | ICD-10-CM | POA: Diagnosis not present

## 2018-05-06 DIAGNOSIS — N2581 Secondary hyperparathyroidism of renal origin: Secondary | ICD-10-CM | POA: Diagnosis not present

## 2018-05-07 DIAGNOSIS — N186 End stage renal disease: Secondary | ICD-10-CM | POA: Diagnosis not present

## 2018-05-07 DIAGNOSIS — Z79899 Other long term (current) drug therapy: Secondary | ICD-10-CM | POA: Diagnosis not present

## 2018-05-07 DIAGNOSIS — N2581 Secondary hyperparathyroidism of renal origin: Secondary | ICD-10-CM | POA: Diagnosis not present

## 2018-05-07 DIAGNOSIS — E875 Hyperkalemia: Secondary | ICD-10-CM | POA: Diagnosis not present

## 2018-05-07 DIAGNOSIS — D631 Anemia in chronic kidney disease: Secondary | ICD-10-CM | POA: Diagnosis not present

## 2018-05-07 DIAGNOSIS — D509 Iron deficiency anemia, unspecified: Secondary | ICD-10-CM | POA: Diagnosis not present

## 2018-05-09 DIAGNOSIS — D509 Iron deficiency anemia, unspecified: Secondary | ICD-10-CM | POA: Diagnosis not present

## 2018-05-09 DIAGNOSIS — R82998 Other abnormal findings in urine: Secondary | ICD-10-CM | POA: Diagnosis not present

## 2018-05-09 DIAGNOSIS — E875 Hyperkalemia: Secondary | ICD-10-CM | POA: Diagnosis not present

## 2018-05-09 DIAGNOSIS — N186 End stage renal disease: Secondary | ICD-10-CM | POA: Diagnosis not present

## 2018-05-09 DIAGNOSIS — D631 Anemia in chronic kidney disease: Secondary | ICD-10-CM | POA: Diagnosis not present

## 2018-05-09 DIAGNOSIS — N2581 Secondary hyperparathyroidism of renal origin: Secondary | ICD-10-CM | POA: Diagnosis not present

## 2018-05-09 DIAGNOSIS — Z79899 Other long term (current) drug therapy: Secondary | ICD-10-CM | POA: Diagnosis not present

## 2018-05-09 DIAGNOSIS — E890 Postprocedural hypothyroidism: Secondary | ICD-10-CM | POA: Diagnosis not present

## 2018-05-11 DIAGNOSIS — N2581 Secondary hyperparathyroidism of renal origin: Secondary | ICD-10-CM | POA: Diagnosis not present

## 2018-05-11 DIAGNOSIS — D631 Anemia in chronic kidney disease: Secondary | ICD-10-CM | POA: Diagnosis not present

## 2018-05-11 DIAGNOSIS — Z79899 Other long term (current) drug therapy: Secondary | ICD-10-CM | POA: Diagnosis not present

## 2018-05-11 DIAGNOSIS — E875 Hyperkalemia: Secondary | ICD-10-CM | POA: Diagnosis not present

## 2018-05-11 DIAGNOSIS — N186 End stage renal disease: Secondary | ICD-10-CM | POA: Diagnosis not present

## 2018-05-11 DIAGNOSIS — D509 Iron deficiency anemia, unspecified: Secondary | ICD-10-CM | POA: Diagnosis not present

## 2018-05-12 DIAGNOSIS — E790 Hyperuricemia without signs of inflammatory arthritis and tophaceous disease: Secondary | ICD-10-CM | POA: Insufficient documentation

## 2018-05-13 DIAGNOSIS — Z79899 Other long term (current) drug therapy: Secondary | ICD-10-CM | POA: Diagnosis not present

## 2018-05-13 DIAGNOSIS — D509 Iron deficiency anemia, unspecified: Secondary | ICD-10-CM | POA: Diagnosis not present

## 2018-05-13 DIAGNOSIS — E875 Hyperkalemia: Secondary | ICD-10-CM | POA: Diagnosis not present

## 2018-05-13 DIAGNOSIS — N186 End stage renal disease: Secondary | ICD-10-CM | POA: Diagnosis not present

## 2018-05-13 DIAGNOSIS — D631 Anemia in chronic kidney disease: Secondary | ICD-10-CM | POA: Diagnosis not present

## 2018-05-13 DIAGNOSIS — N2581 Secondary hyperparathyroidism of renal origin: Secondary | ICD-10-CM | POA: Diagnosis not present

## 2018-05-16 DIAGNOSIS — N2581 Secondary hyperparathyroidism of renal origin: Secondary | ICD-10-CM | POA: Diagnosis not present

## 2018-05-16 DIAGNOSIS — E875 Hyperkalemia: Secondary | ICD-10-CM | POA: Diagnosis not present

## 2018-05-16 DIAGNOSIS — Z79899 Other long term (current) drug therapy: Secondary | ICD-10-CM | POA: Diagnosis not present

## 2018-05-16 DIAGNOSIS — N186 End stage renal disease: Secondary | ICD-10-CM | POA: Diagnosis not present

## 2018-05-16 DIAGNOSIS — D631 Anemia in chronic kidney disease: Secondary | ICD-10-CM | POA: Diagnosis not present

## 2018-05-16 DIAGNOSIS — D509 Iron deficiency anemia, unspecified: Secondary | ICD-10-CM | POA: Diagnosis not present

## 2018-05-18 DIAGNOSIS — D509 Iron deficiency anemia, unspecified: Secondary | ICD-10-CM | POA: Diagnosis not present

## 2018-05-18 DIAGNOSIS — D631 Anemia in chronic kidney disease: Secondary | ICD-10-CM | POA: Diagnosis not present

## 2018-05-18 DIAGNOSIS — Z79899 Other long term (current) drug therapy: Secondary | ICD-10-CM | POA: Diagnosis not present

## 2018-05-18 DIAGNOSIS — E875 Hyperkalemia: Secondary | ICD-10-CM | POA: Diagnosis not present

## 2018-05-18 DIAGNOSIS — N186 End stage renal disease: Secondary | ICD-10-CM | POA: Diagnosis not present

## 2018-05-18 DIAGNOSIS — N2581 Secondary hyperparathyroidism of renal origin: Secondary | ICD-10-CM | POA: Diagnosis not present

## 2018-05-20 DIAGNOSIS — D509 Iron deficiency anemia, unspecified: Secondary | ICD-10-CM | POA: Diagnosis not present

## 2018-05-20 DIAGNOSIS — N2581 Secondary hyperparathyroidism of renal origin: Secondary | ICD-10-CM | POA: Diagnosis not present

## 2018-05-20 DIAGNOSIS — N186 End stage renal disease: Secondary | ICD-10-CM | POA: Diagnosis not present

## 2018-05-20 DIAGNOSIS — E875 Hyperkalemia: Secondary | ICD-10-CM | POA: Diagnosis not present

## 2018-05-20 DIAGNOSIS — Z79899 Other long term (current) drug therapy: Secondary | ICD-10-CM | POA: Diagnosis not present

## 2018-05-20 DIAGNOSIS — D631 Anemia in chronic kidney disease: Secondary | ICD-10-CM | POA: Diagnosis not present

## 2018-05-21 DIAGNOSIS — Z79899 Other long term (current) drug therapy: Secondary | ICD-10-CM | POA: Diagnosis not present

## 2018-05-21 DIAGNOSIS — N186 End stage renal disease: Secondary | ICD-10-CM | POA: Diagnosis not present

## 2018-05-21 DIAGNOSIS — E875 Hyperkalemia: Secondary | ICD-10-CM | POA: Diagnosis not present

## 2018-05-21 DIAGNOSIS — D631 Anemia in chronic kidney disease: Secondary | ICD-10-CM | POA: Diagnosis not present

## 2018-05-21 DIAGNOSIS — D509 Iron deficiency anemia, unspecified: Secondary | ICD-10-CM | POA: Diagnosis not present

## 2018-05-21 DIAGNOSIS — N2581 Secondary hyperparathyroidism of renal origin: Secondary | ICD-10-CM | POA: Diagnosis not present

## 2018-05-25 DIAGNOSIS — D631 Anemia in chronic kidney disease: Secondary | ICD-10-CM | POA: Diagnosis not present

## 2018-05-25 DIAGNOSIS — D509 Iron deficiency anemia, unspecified: Secondary | ICD-10-CM | POA: Diagnosis not present

## 2018-05-25 DIAGNOSIS — Z79899 Other long term (current) drug therapy: Secondary | ICD-10-CM | POA: Diagnosis not present

## 2018-05-25 DIAGNOSIS — N186 End stage renal disease: Secondary | ICD-10-CM | POA: Diagnosis not present

## 2018-05-25 DIAGNOSIS — N2581 Secondary hyperparathyroidism of renal origin: Secondary | ICD-10-CM | POA: Diagnosis not present

## 2018-05-25 DIAGNOSIS — E875 Hyperkalemia: Secondary | ICD-10-CM | POA: Diagnosis not present

## 2018-05-26 ENCOUNTER — Ambulatory Visit: Payer: Self-pay | Admitting: Surgery

## 2018-05-26 DIAGNOSIS — N186 End stage renal disease: Secondary | ICD-10-CM | POA: Diagnosis not present

## 2018-05-26 NOTE — H&P (Signed)
Terry Juarez Documented: 05/26/2018 10:11 AM Location: Terry Juarez Patient #: [id number] DOB: 1966/05/12 Single / Language: Terry Juarez / Race: Black or African American Female  History of Present Illness (Terry Farias A. Kae Heller MD; 05/26/2018 10:43 AM) Patient words: This is a very nice 52 year old woman is referred for removal of peritoneal dialysis catheter. This is the second one that she has had. It was working well until a couple of months ago when it was not adequately taking all fluid. She was admitted to the hospital with fluid overload, approximately 40 pounds of water weight requiring re-initialization of hemodialysis. When they first started doing this via her right upper extremity fistula she apparently had a cardiac arrest which responded to atropine. She does not have any prior cardiac problems although she does have a history of stroke, right renal mass status post laparoscopic radical nephrectomy, hypertension, hyperlipidemia, anemia. Other abdominal Juarez includes ectopic pregnancy Juarez many years ago.   Insertion at [facility] April 2017 Dr. Vicente Juarez: " ...a right paramedian incision was made slightly above the umbilicus. This was carried down through the skin and subcutaneous tissue down through the skin and subcutaneous tissue and through the anterior rectus sheath. This was opened and the muscle was split in the direction of its fibers. The posterior sheath and peritoneum were grasped between hemostats and a small nick was made in the peritoneal cavity. A chronic coiled peritoneal dialysis catheter was then directed into the pelvis using a Foley catheter guide. On our initial pass the catheter did not get in the proper position. We pulled the catheter out and put it in again and this time it appears to be in excellent position at the base of the pelvis. It flushed and drained well. The pursestring suture was then pulled taut around the catheter to make it watertight. The anterior  rectus sheath was then closed with 0 Vicryl. The catheter was brought out through a separate stab wound inferior and laterally to the insertion site...".  The patient is a 52 year old female.   Past Surgical History Terry Juarez, Terry Juarez; 05/26/2018 10:11 AM) Dialysis Shunt / Fistula Nephrectomy Right. Thyroid Juarez  Diagnostic Studies History Terry Juarez, Terry Juarez; 05/26/2018 10:11 AM) Colonoscopy 1-5 years ago  Allergies Terry Juarez, CMA; 05/26/2018 10:12 AM) Lisinopril *ANTIHYPERTENSIVES* Allergies Reconciled  Medication History Terry Juarez, CMA; 05/26/2018 10:13 AM) Rena-Vite (Oral) Active. Levothyroxine Sodium (112MCG Tablet, Oral) Active. Gabapentin (100MG Capsule, Oral) Active. cloNIDine HCl (0.1MG Tablet, Oral) Active. amLODIPine Besylate (10MG Tablet, Oral) Active. Carvedilol (12.5MG Tablet, Oral) Active. Ethyl Chloride (External) Active. Furosemide (80MG Tablet, Oral) Active. Lidocaine-Prilocaine (2.5-2.5% Cream, External) Active. levETIRAcetam (500MG Tablet, Oral) Active. Medications Reconciled  Social History Terry Juarez, Terry Juarez; 05/26/2018 10:11 AM) Alcohol use Moderate alcohol use. Caffeine use Coffee. No drug use Tobacco use Never smoker.  Family History Terry Juarez, Terry Juarez; 05/26/2018 10:11 AM) Alcohol Abuse Father. Arthritis Family Members In General, Father. Bleeding disorder Father. Cancer Mother. Cerebrovascular Accident Father. Hypertension Family Members In General, Father. Kidney Disease Father.  Pregnancy / Birth History Terry Juarez, Terry Juarez; 05/26/2018 10:11 AM) Age at menarche 37 years. Age of menopause <44 Para 0  Other Problems Terry Juarez, Terry Juarez; 05/26/2018 10:11 AM) Cancer Cerebrovascular Accident Chronic Renal Failure Syndrome High blood pressure Hypercholesterolemia Seizure Disorder Thyroid Disease     Review of Systems (Terry Kauffmann A. Kae Heller MD; 05/26/2018 10:43  AM) General Not Present- Appetite Loss, Chills, Fatigue, Fever, Night Sweats, Weight Gain and Weight Loss. Skin Present- Dryness. Not Present- Change in Wart/Mole, Hives, Jaundice,  New Lesions, Non-Healing Wounds, Rash and Ulcer. HEENT Present- Wears glasses/contact lenses. Not Present- Earache, Hearing Loss, Hoarseness, Nose Bleed, Oral Ulcers, Ringing in the Ears, Seasonal Allergies, Sinus Pain, Sore Throat, Visual Disturbances and Yellow Eyes. Respiratory Not Present- Bloody sputum, Chronic Cough, Difficulty Breathing, Snoring and Wheezing. Breast Not Present- Breast Mass, Breast Pain, Nipple Discharge and Skin Changes. Gastrointestinal Not Present- Abdominal Pain, Bloating, Bloody Stool, Change in Bowel Habits, Chronic diarrhea, Constipation, Difficulty Swallowing, Excessive gas, Gets full quickly at meals, Hemorrhoids, Indigestion, Nausea, Rectal Pain and Vomiting. Female Genitourinary Not Present- Frequency, Nocturia, Painful Urination, Pelvic Pain and Urgency. Musculoskeletal Not Present- Back Pain, Joint Pain, Joint Stiffness, Muscle Pain, Muscle Weakness and Swelling of Extremities. Neurological Present- Headaches, Trouble walking and Weakness. Not Present- Decreased Memory, Fainting, Numbness, Seizures, Tingling and Tremor. Psychiatric Not Present- Anxiety, Bipolar, Change in Sleep Pattern, Depression, Fearful and Frequent crying. Endocrine Present- Cold Intolerance. Not Present- Excessive Hunger, Hair Changes, Heat Intolerance, Hot flashes and New Diabetes. Hematology Present- Easy Bruising. Not Present- Blood Thinners, Excessive bleeding, Gland problems, HIV and Persistent Infections. All other systems negative  Vitals Terry Juarez CMA; 05/26/2018 10:12 AM) 05/26/2018 10:11 AM Weight: 124.13 lb Height: 65in Body Surface Area: 1.62 m Body Mass Index: 20.66 kg/m  Temp.: 98.9F  Pulse: 68 (Regular)  BP: 132/82 (Sitting, Left Arm, Standard)      Physical Exam  (Terry Fort A. Kae Heller MD; 05/26/2018 10:44 AM)  The physical exam findings are as follows: Note:Gen: alert and chronically ill-appearing Eye: extraocular motion intact, no scleral icterus ENT: moist mucus membranes, dentition intact Neck: no mass or thyromegaly, healed collar incision from prior thyroid Juarez Chest: unlabored respirations, symmetrical air entry, clear bilaterally CV: regular rate and rhythm, no pedal edema Abdomen: soft, nontender, nondistended. No mass or organomegaly. Multiple healed scars, catheter exits in the right upper abdomen in his palpable coursing superior medially seems to enter the abdomen just above the umbilicus on the right MSK: Has some left-sided weakness, no deformity Neuro: ambulates with cane Psych: normal mood and affect, appropriate insight Skin: warm and dry, no rash or lesion on limited exam    Assessment & Plan (Jodine Muchmore A. Kae Heller MD; 05/26/2018 10:45 AM)  ESRD (END STAGE RENAL DISEASE) (N18.6) Story: Desires removal of dysfunctional peritoneal dialysis catheter. We will plan to do this under [place] with local. Discussed risks of bleeding, infection, pain, scarring, injury to intra-abdominal structures, cardiovascular complications etc. Questions were answered. She desires to proceed.

## 2018-05-26 NOTE — H&P (View-Only) (Signed)
Terry Juarez Documented: 05/26/2018 10:11 AM Location: [facility] Patient #: [id number] DOB: 10/30/1965 Single / Language: English / Race: Black or African American Female  History of Present Illness (Elvin Banker A. Chas Axel MD; 05/26/2018 10:43 AM) Patient words: This is a very nice 52-year-old woman is referred for removal of peritoneal dialysis catheter. This is the second one that she has had. It was working well until a couple of months ago when it was not adequately taking all fluid. She was admitted to the hospital with fluid overload, approximately 40 pounds of water weight requiring re-initialization of hemodialysis. When they first started doing this via her right upper extremity fistula she apparently had a cardiac arrest which responded to atropine. She does not have any prior cardiac problems although she does have a history of stroke, right renal mass status post laparoscopic radical nephrectomy, hypertension, hyperlipidemia, anemia. Other abdominal surgery includes ectopic pregnancy surgery many years ago.   Insertion at [facility] April 2017 Dr. Plonk: " ...a right paramedian incision was made slightly above the umbilicus. This was carried down through the skin and subcutaneous tissue down through the skin and subcutaneous tissue and through the anterior rectus sheath. This was opened and the muscle was split in the direction of its fibers. The posterior sheath and peritoneum were grasped between hemostats and a small nick was made in the peritoneal cavity. A chronic coiled peritoneal dialysis catheter was then directed into the pelvis using a Foley catheter guide. On our initial pass the catheter did not get in the proper position. We pulled the catheter out and put it in again and this time it appears to be in excellent position at the base of the pelvis. It flushed and drained well. The pursestring suture was then pulled taut around the catheter to make it watertight. The anterior  rectus sheath was then closed with 0 Vicryl. The catheter was brought out through a separate stab wound inferior and laterally to the insertion site...".  The patient is a 52 year old female.   Past Surgical History (Kelsey Phillips, CMA; 05/26/2018 10:11 AM) Dialysis Shunt / Fistula Nephrectomy Right. Thyroid Surgery  Diagnostic Studies History (Kelsey Phillips, CMA; 05/26/2018 10:11 AM) Colonoscopy 1-5 years ago  Allergies (Kelsey Phillips, CMA; 05/26/2018 10:12 AM) Lisinopril *ANTIHYPERTENSIVES* Allergies Reconciled  Medication History (Kelsey Phillips, CMA; 05/26/2018 10:13 AM) Rena-Vite (Oral) Active. Levothyroxine Sodium (112MCG Tablet, Oral) Active. Gabapentin (100MG Capsule, Oral) Active. cloNIDine HCl (0.1MG Tablet, Oral) Active. amLODIPine Besylate (10MG Tablet, Oral) Active. Carvedilol (12.5MG Tablet, Oral) Active. Ethyl Chloride (External) Active. Furosemide (80MG Tablet, Oral) Active. Lidocaine-Prilocaine (2.5-2.5% Cream, External) Active. levETIRAcetam (500MG Tablet, Oral) Active. Medications Reconciled  Social History (Kelsey Phillips, CMA; 05/26/2018 10:11 AM) Alcohol use Moderate alcohol use. Caffeine use Coffee. No drug use Tobacco use Never smoker.  Family History (Kelsey Phillips, CMA; 05/26/2018 10:11 AM) Alcohol Abuse Father. Arthritis Family Members In General, Father. Bleeding disorder Father. Cancer Mother. Cerebrovascular Accident Father. Hypertension Family Members In General, Father. Kidney Disease Father.  Pregnancy / Birth History (Kelsey Phillips, CMA; 05/26/2018 10:11 AM) Age at menarche 13 years. Age of menopause <44 Para 0  Other Problems (Kelsey Phillips, CMA; 05/26/2018 10:11 AM) Cancer Cerebrovascular Accident Chronic Renal Failure Syndrome High blood pressure Hypercholesterolemia Seizure Disorder Thyroid Disease     Review of Systems (Wavie Hashimi A. Izamar Linden MD; 05/26/2018 10:43  AM) General Not Present- Appetite Loss, Chills, Fatigue, Fever, Night Sweats, Weight Gain and Weight Loss. Skin Present- Dryness. Not Present- Change in Wart/Mole, Hives, Jaundice,   New Lesions, Non-Healing Wounds, Rash and Ulcer. HEENT Present- Wears glasses/contact lenses. Not Present- Earache, Hearing Loss, Hoarseness, Nose Bleed, Oral Ulcers, Ringing in the Ears, Seasonal Allergies, Sinus Pain, Sore Throat, Visual Disturbances and Yellow Eyes. Respiratory Not Present- Bloody sputum, Chronic Cough, Difficulty Breathing, Snoring and Wheezing. Breast Not Present- Breast Mass, Breast Pain, Nipple Discharge and Skin Changes. Gastrointestinal Not Present- Abdominal Pain, Bloating, Bloody Stool, Change in Bowel Habits, Chronic diarrhea, Constipation, Difficulty Swallowing, Excessive gas, Gets full quickly at meals, Hemorrhoids, Indigestion, Nausea, Rectal Pain and Vomiting. Female Genitourinary Not Present- Frequency, Nocturia, Painful Urination, Pelvic Pain and Urgency. Musculoskeletal Not Present- Back Pain, Joint Pain, Joint Stiffness, Muscle Pain, Muscle Weakness and Swelling of Extremities. Neurological Present- Headaches, Trouble walking and Weakness. Not Present- Decreased Memory, Fainting, Numbness, Seizures, Tingling and Tremor. Psychiatric Not Present- Anxiety, Bipolar, Change in Sleep Pattern, Depression, Fearful and Frequent crying. Endocrine Present- Cold Intolerance. Not Present- Excessive Hunger, Hair Changes, Heat Intolerance, Hot flashes and New Diabetes. Hematology Present- Easy Bruising. Not Present- Blood Thinners, Excessive bleeding, Gland problems, HIV and Persistent Infections. All other systems negative  Vitals (Kelsey Phillips CMA; 05/26/2018 10:12 AM) 05/26/2018 10:11 AM Weight: 124.13 lb Height: 65in Body Surface Area: 1.62 m Body Mass Index: 20.66 kg/m  Temp.: 98.2F  Pulse: 68 (Regular)  BP: 132/82 (Sitting, Left Arm, Standard)      Physical Exam  (Tauna Macfarlane A. Yamilee Harmes MD; 05/26/2018 10:44 AM)  The physical exam findings are as follows: Note:Gen: alert and chronically ill-appearing Eye: extraocular motion intact, no scleral icterus ENT: moist mucus membranes, dentition intact Neck: no mass or thyromegaly, healed collar incision from prior thyroid surgery Chest: unlabored respirations, symmetrical air entry, clear bilaterally CV: regular rate and rhythm, no pedal edema Abdomen: soft, nontender, nondistended. No mass or organomegaly. Multiple healed scars, catheter exits in the right upper abdomen in his palpable coursing superior medially seems to enter the abdomen just above the umbilicus on the right MSK: Has some left-sided weakness, no deformity Neuro: ambulates with cane Psych: normal mood and affect, appropriate insight Skin: warm and dry, no rash or lesion on limited exam    Assessment & Plan (Jevaeh Shams A. Alla Sloma MD; 05/26/2018 10:45 AM)  ESRD (END STAGE RENAL DISEASE) (N18.6) Story: Desires removal of dysfunctional peritoneal dialysis catheter. We will plan to do this under Mac with local. Discussed risks of bleeding, infection, pain, scarring, injury to intra-abdominal structures, cardiovascular complications etc. Questions were answered. She desires to proceed.  

## 2018-05-27 DIAGNOSIS — N186 End stage renal disease: Secondary | ICD-10-CM | POA: Diagnosis not present

## 2018-05-27 DIAGNOSIS — E875 Hyperkalemia: Secondary | ICD-10-CM | POA: Diagnosis not present

## 2018-05-27 DIAGNOSIS — D631 Anemia in chronic kidney disease: Secondary | ICD-10-CM | POA: Diagnosis not present

## 2018-05-27 DIAGNOSIS — D509 Iron deficiency anemia, unspecified: Secondary | ICD-10-CM | POA: Diagnosis not present

## 2018-05-27 DIAGNOSIS — Z79899 Other long term (current) drug therapy: Secondary | ICD-10-CM | POA: Diagnosis not present

## 2018-05-27 DIAGNOSIS — N2581 Secondary hyperparathyroidism of renal origin: Secondary | ICD-10-CM | POA: Diagnosis not present

## 2018-05-28 DIAGNOSIS — E875 Hyperkalemia: Secondary | ICD-10-CM | POA: Diagnosis not present

## 2018-05-28 DIAGNOSIS — N2581 Secondary hyperparathyroidism of renal origin: Secondary | ICD-10-CM | POA: Diagnosis not present

## 2018-05-28 DIAGNOSIS — Z79899 Other long term (current) drug therapy: Secondary | ICD-10-CM | POA: Diagnosis not present

## 2018-05-28 DIAGNOSIS — D631 Anemia in chronic kidney disease: Secondary | ICD-10-CM | POA: Diagnosis not present

## 2018-05-28 DIAGNOSIS — D509 Iron deficiency anemia, unspecified: Secondary | ICD-10-CM | POA: Diagnosis not present

## 2018-05-28 DIAGNOSIS — N186 End stage renal disease: Secondary | ICD-10-CM | POA: Diagnosis not present

## 2018-05-30 DIAGNOSIS — E875 Hyperkalemia: Secondary | ICD-10-CM | POA: Diagnosis not present

## 2018-05-30 DIAGNOSIS — N186 End stage renal disease: Secondary | ICD-10-CM | POA: Diagnosis not present

## 2018-05-30 DIAGNOSIS — D509 Iron deficiency anemia, unspecified: Secondary | ICD-10-CM | POA: Diagnosis not present

## 2018-05-30 DIAGNOSIS — N2581 Secondary hyperparathyroidism of renal origin: Secondary | ICD-10-CM | POA: Diagnosis not present

## 2018-05-30 DIAGNOSIS — D631 Anemia in chronic kidney disease: Secondary | ICD-10-CM | POA: Diagnosis not present

## 2018-05-30 DIAGNOSIS — Z79899 Other long term (current) drug therapy: Secondary | ICD-10-CM | POA: Diagnosis not present

## 2018-05-31 ENCOUNTER — Encounter (HOSPITAL_COMMUNITY): Payer: Self-pay | Admitting: *Deleted

## 2018-05-31 ENCOUNTER — Other Ambulatory Visit: Payer: Self-pay

## 2018-06-01 ENCOUNTER — Ambulatory Visit (HOSPITAL_COMMUNITY): Payer: Medicare Other | Admitting: Certified Registered Nurse Anesthetist

## 2018-06-01 ENCOUNTER — Ambulatory Visit (HOSPITAL_COMMUNITY)
Admission: RE | Admit: 2018-06-01 | Discharge: 2018-06-01 | Disposition: A | Discharge status: Home / Self Care | Payer: Medicare Other | Source: Ambulatory Visit | Attending: Surgery | Admitting: Surgery

## 2018-06-01 ENCOUNTER — Encounter (HOSPITAL_COMMUNITY)
Admission: RE | Disposition: A | Discharge status: Home / Self Care | Payer: Self-pay | Source: Ambulatory Visit | Attending: Surgery

## 2018-06-01 ENCOUNTER — Encounter (HOSPITAL_COMMUNITY): Payer: Self-pay

## 2018-06-01 DIAGNOSIS — Z992 Dependence on renal dialysis: Secondary | ICD-10-CM | POA: Insufficient documentation

## 2018-06-01 DIAGNOSIS — Z8673 Personal history of transient ischemic attack (TIA), and cerebral infarction without residual deficits: Secondary | ICD-10-CM | POA: Insufficient documentation

## 2018-06-01 DIAGNOSIS — D509 Iron deficiency anemia, unspecified: Secondary | ICD-10-CM | POA: Diagnosis not present

## 2018-06-01 DIAGNOSIS — D631 Anemia in chronic kidney disease: Secondary | ICD-10-CM | POA: Diagnosis not present

## 2018-06-01 DIAGNOSIS — Z79899 Other long term (current) drug therapy: Secondary | ICD-10-CM | POA: Insufficient documentation

## 2018-06-01 DIAGNOSIS — Z841 Family history of disorders of kidney and ureter: Secondary | ICD-10-CM | POA: Diagnosis not present

## 2018-06-01 DIAGNOSIS — E079 Disorder of thyroid, unspecified: Secondary | ICD-10-CM | POA: Diagnosis not present

## 2018-06-01 DIAGNOSIS — E875 Hyperkalemia: Secondary | ICD-10-CM | POA: Diagnosis not present

## 2018-06-01 DIAGNOSIS — N186 End stage renal disease: Secondary | ICD-10-CM | POA: Diagnosis not present

## 2018-06-01 DIAGNOSIS — Z79890 Hormone replacement therapy (postmenopausal): Secondary | ICD-10-CM | POA: Insufficient documentation

## 2018-06-01 DIAGNOSIS — Z8674 Personal history of sudden cardiac arrest: Secondary | ICD-10-CM | POA: Diagnosis not present

## 2018-06-01 DIAGNOSIS — T82898A Other specified complication of vascular prosthetic devices, implants and grafts, initial encounter: Secondary | ICD-10-CM | POA: Diagnosis not present

## 2018-06-01 DIAGNOSIS — Y832 Surgical operation with anastomosis, bypass or graft as the cause of abnormal reaction of the patient, or of later complication, without mention of misadventure at the time of the procedure: Secondary | ICD-10-CM | POA: Insufficient documentation

## 2018-06-01 DIAGNOSIS — I120 Hypertensive chronic kidney disease with stage 5 chronic kidney disease or end stage renal disease: Secondary | ICD-10-CM | POA: Insufficient documentation

## 2018-06-01 DIAGNOSIS — Z823 Family history of stroke: Secondary | ICD-10-CM | POA: Diagnosis not present

## 2018-06-01 DIAGNOSIS — T85611A Breakdown (mechanical) of intraperitoneal dialysis catheter, initial encounter: Secondary | ICD-10-CM | POA: Insufficient documentation

## 2018-06-01 DIAGNOSIS — N2581 Secondary hyperparathyroidism of renal origin: Secondary | ICD-10-CM | POA: Diagnosis not present

## 2018-06-01 DIAGNOSIS — Z4902 Encounter for fitting and adjustment of peritoneal dialysis catheter: Secondary | ICD-10-CM | POA: Diagnosis not present

## 2018-06-01 HISTORY — DX: Malignant (primary) neoplasm, unspecified: C80.1

## 2018-06-01 HISTORY — DX: Gastrointestinal hemorrhage, unspecified: K92.2

## 2018-06-01 HISTORY — DX: Pneumonia, unspecified organism: J18.9

## 2018-06-01 HISTORY — DX: Hypothyroidism, unspecified: E03.9

## 2018-06-01 HISTORY — PX: CAPD REMOVAL: SHX5234

## 2018-06-01 LAB — BASIC METABOLIC PANEL
Anion gap: 17 — ABNORMAL HIGH (ref 5–15)
BUN: 48 mg/dL — AB (ref 6–20)
CALCIUM: 10 mg/dL (ref 8.9–10.3)
CO2: 25 mmol/L (ref 22–32)
Chloride: 97 mmol/L — ABNORMAL LOW (ref 98–111)
Creatinine, Ser: 7.95 mg/dL — ABNORMAL HIGH (ref 0.44–1.00)
Glucose, Bld: 95 mg/dL (ref 70–99)
Potassium: 4.7 mmol/L (ref 3.5–5.1)
Sodium: 139 mmol/L (ref 135–145)

## 2018-06-01 LAB — CBC WITH DIFFERENTIAL/PLATELET
Abs Immature Granulocytes: 0.03 10*3/uL (ref 0.00–0.07)
BASOS ABS: 0.1 10*3/uL (ref 0.0–0.1)
Basophils Relative: 2 %
EOS ABS: 0.4 10*3/uL (ref 0.0–0.5)
EOS PCT: 5 %
HCT: 48.9 % — ABNORMAL HIGH (ref 36.0–46.0)
Hemoglobin: 14.1 g/dL (ref 12.0–15.0)
Immature Granulocytes: 0 %
Lymphocytes Relative: 12 %
Lymphs Abs: 0.9 10*3/uL (ref 0.7–4.0)
MCH: 30.9 pg (ref 26.0–34.0)
MCHC: 28.8 g/dL — AB (ref 30.0–36.0)
MCV: 107 fL — ABNORMAL HIGH (ref 80.0–100.0)
MONO ABS: 0.9 10*3/uL (ref 0.1–1.0)
Monocytes Relative: 12 %
NRBC: 0 % (ref 0.0–0.2)
Neutro Abs: 5.2 10*3/uL (ref 1.7–7.7)
Neutrophils Relative %: 69 %
Platelets: 225 10*3/uL (ref 150–400)
RBC: 4.57 MIL/uL (ref 3.87–5.11)
RDW: 17.2 % — AB (ref 11.5–15.5)
WBC: 7.6 10*3/uL (ref 4.0–10.5)

## 2018-06-01 SURGERY — CONTINUOUS AMBULATORY PERITONEAL DIALYSIS  (CAPD) CATHETER REMOVAL
Anesthesia: Monitor Anesthesia Care | Site: Abdomen

## 2018-06-01 MED ORDER — FENTANYL CITRATE (PF) 100 MCG/2ML IJ SOLN: 25.0000 ug | INTRAMUSCULAR | Status: DC | PRN

## 2018-06-01 MED ORDER — HYDROCODONE-ACETAMINOPHEN 5-325 MG PO TABS: 1.0000 | ORAL_TABLET | Freq: Four times a day (QID) | ORAL | 0 refills | Status: DC | PRN

## 2018-06-01 MED ORDER — SODIUM CHLORIDE 0.9 % IV SOLN
INTRAVENOUS | Status: DC | PRN
  Administered 2018-06-01: 10:00:00 via INTRAVENOUS

## 2018-06-01 MED ORDER — CHLORHEXIDINE GLUCONATE 4 % EX LIQD: 60.0000 mL | Freq: Once | CUTANEOUS | Status: DC

## 2018-06-01 MED ORDER — OXYCODONE HCL 5 MG PO TABS: 5.0000 mg | ORAL_TABLET | Freq: Once | ORAL | Status: DC | PRN

## 2018-06-01 MED ORDER — CISATRACURIUM BESYLATE 20 MG/10ML IV SOLN
INTRAVENOUS | Status: AC
  Filled 2018-06-01: qty 10

## 2018-06-01 MED ORDER — DOCUSATE SODIUM 100 MG PO CAPS: 100.0000 mg | ORAL_CAPSULE | Freq: Two times a day (BID) | ORAL | 0 refills | Status: AC

## 2018-06-01 MED ORDER — 0.9 % SODIUM CHLORIDE (POUR BTL) OPTIME
TOPICAL | Status: DC | PRN
  Administered 2018-06-01: 1000 mL

## 2018-06-01 MED ORDER — ACETAMINOPHEN 500 MG PO TABS
1000.0000 mg | ORAL_TABLET | ORAL | Status: AC
  Administered 2018-06-01: 1000 mg via ORAL
  Filled 2018-06-01: qty 2

## 2018-06-01 MED ORDER — SODIUM CHLORIDE 0.9 % IV SOLN: 250.0000 mL | INTRAVENOUS | Status: DC | PRN

## 2018-06-01 MED ORDER — PROPOFOL 10 MG/ML IV BOLUS
INTRAVENOUS | Status: DC | PRN
  Administered 2018-06-01: 80 mg via INTRAVENOUS

## 2018-06-01 MED ORDER — NEOSTIGMINE METHYLSULFATE 10 MG/10ML IV SOLN
INTRAVENOUS | Status: DC | PRN
  Administered 2018-06-01: 4 mg via INTRAVENOUS

## 2018-06-01 MED ORDER — CEFAZOLIN SODIUM-DEXTROSE 2-4 GM/100ML-% IV SOLN
2.0000 g | INTRAVENOUS | Status: AC
  Administered 2018-06-01: 2 g via INTRAVENOUS
  Filled 2018-06-01: qty 100

## 2018-06-01 MED ORDER — BUPIVACAINE-EPINEPHRINE 0.25% -1:200000 IJ SOLN
INTRAMUSCULAR | Status: DC | PRN
  Administered 2018-06-01: 12 mL

## 2018-06-01 MED ORDER — BUPIVACAINE-EPINEPHRINE (PF) 0.25% -1:200000 IJ SOLN
INTRAMUSCULAR | Status: AC
  Filled 2018-06-01: qty 30

## 2018-06-01 MED ORDER — MIDAZOLAM HCL 2 MG/2ML IJ SOLN
INTRAMUSCULAR | Status: DC | PRN
  Administered 2018-06-01: 1 mg via INTRAVENOUS

## 2018-06-01 MED ORDER — EPHEDRINE SULFATE-NACL 50-0.9 MG/10ML-% IV SOSY
PREFILLED_SYRINGE | INTRAVENOUS | Status: DC | PRN
  Administered 2018-06-01: 10 mg via INTRAVENOUS

## 2018-06-01 MED ORDER — ACETAMINOPHEN 650 MG RE SUPP: 650.0000 mg | RECTAL | Status: DC | PRN

## 2018-06-01 MED ORDER — LIDOCAINE 2% (20 MG/ML) 5 ML SYRINGE
INTRAMUSCULAR | Status: AC
  Filled 2018-06-01: qty 5

## 2018-06-01 MED ORDER — MIDAZOLAM HCL 2 MG/2ML IJ SOLN
INTRAMUSCULAR | Status: AC
  Filled 2018-06-01: qty 2

## 2018-06-01 MED ORDER — FENTANYL CITRATE (PF) 250 MCG/5ML IJ SOLN
INTRAMUSCULAR | Status: AC
  Filled 2018-06-01: qty 5

## 2018-06-01 MED ORDER — GLYCOPYRROLATE 0.2 MG/ML IJ SOLN
INTRAMUSCULAR | Status: DC | PRN
  Administered 2018-06-01: 0.6 mg via INTRAVENOUS

## 2018-06-01 MED ORDER — CISATRACURIUM BESYLATE (PF) 10 MG/5ML IV SOLN
INTRAVENOUS | Status: DC | PRN
  Administered 2018-06-01: 14 mg via INTRAVENOUS

## 2018-06-01 MED ORDER — OXYCODONE HCL 5 MG PO TABS: 5.0000 mg | ORAL_TABLET | ORAL | Status: DC | PRN

## 2018-06-01 MED ORDER — FENTANYL CITRATE (PF) 100 MCG/2ML IJ SOLN
INTRAMUSCULAR | Status: DC | PRN
  Administered 2018-06-01: 50 ug via INTRAVENOUS

## 2018-06-01 MED ORDER — ACETAMINOPHEN 325 MG PO TABS: 650.0000 mg | ORAL_TABLET | ORAL | Status: DC | PRN

## 2018-06-01 MED ORDER — PROPOFOL 10 MG/ML IV BOLUS
INTRAVENOUS | Status: AC
  Filled 2018-06-01: qty 20

## 2018-06-01 MED ORDER — ONDANSETRON HCL 4 MG/2ML IJ SOLN: 4.0000 mg | Freq: Once | INTRAMUSCULAR | Status: DC | PRN

## 2018-06-01 MED ORDER — OXYCODONE HCL 5 MG/5ML PO SOLN: 5.0000 mg | Freq: Once | ORAL | Status: DC | PRN

## 2018-06-01 MED ORDER — ONDANSETRON HCL 4 MG/2ML IJ SOLN
INTRAMUSCULAR | Status: DC | PRN
  Administered 2018-06-01 (×2): 4 mg via INTRAVENOUS

## 2018-06-01 MED ORDER — SODIUM CHLORIDE 0.9% FLUSH: 3.0000 mL | Freq: Two times a day (BID) | INTRAVENOUS | Status: DC

## 2018-06-01 MED ORDER — LACTATED RINGERS IV SOLN: INTRAVENOUS | Status: DC | PRN

## 2018-06-01 MED ORDER — SODIUM CHLORIDE 0.9% FLUSH: 3.0000 mL | INTRAVENOUS | Status: DC | PRN

## 2018-06-01 SURGICAL SUPPLY — 35 items
BLADE CLIPPER SURG (BLADE) IMPLANT
BLADE SURG 15 STRL LF DISP TIS (BLADE) ×1 IMPLANT
BLADE SURG 15 STRL SS (BLADE) ×2
CANISTER SUCT 3000ML PPV (MISCELLANEOUS) IMPLANT
COVER SURGICAL LIGHT HANDLE (MISCELLANEOUS) ×3 IMPLANT
COVER WAND RF STERILE (DRAPES) IMPLANT
DERMABOND ADVANCED (GAUZE/BANDAGES/DRESSINGS) ×2
DERMABOND ADVANCED .7 DNX12 (GAUZE/BANDAGES/DRESSINGS) ×1 IMPLANT
DRAPE LAPAROSCOPIC ABDOMINAL (DRAPES) ×3 IMPLANT
ELECT CAUTERY BLADE 6.4 (BLADE) ×3 IMPLANT
ELECT REM PT RETURN 9FT ADLT (ELECTROSURGICAL) ×3
ELECTRODE REM PT RTRN 9FT ADLT (ELECTROSURGICAL) ×1 IMPLANT
GAUZE 4X4 16PLY RFD (DISPOSABLE) ×3 IMPLANT
GLOVE BIO SURGEON STRL SZ 6 (GLOVE) ×3 IMPLANT
GLOVE INDICATOR 6.5 STRL GRN (GLOVE) ×3 IMPLANT
GOWN STRL REUS W/ TWL LRG LVL3 (GOWN DISPOSABLE) ×2 IMPLANT
GOWN STRL REUS W/TWL LRG LVL3 (GOWN DISPOSABLE) ×4
KIT BASIN OR (CUSTOM PROCEDURE TRAY) ×3 IMPLANT
KIT TURNOVER KIT B (KITS) ×3 IMPLANT
NEEDLE HYPO 25GX1X1/2 BEV (NEEDLE) ×3 IMPLANT
NS IRRIG 1000ML POUR BTL (IV SOLUTION) ×3 IMPLANT
PACK SURGICAL SETUP 50X90 (CUSTOM PROCEDURE TRAY) ×3 IMPLANT
PAD ARMBOARD 7.5X6 YLW CONV (MISCELLANEOUS) ×3 IMPLANT
PENCIL BUTTON HOLSTER BLD 10FT (ELECTRODE) ×3 IMPLANT
SUT MNCRL AB 4-0 PS2 18 (SUTURE) ×3 IMPLANT
SUT VIC AB 0 CT1 27 (SUTURE) ×2
SUT VIC AB 0 CT1 27XBRD ANBCTR (SUTURE) ×1 IMPLANT
SUT VIC AB 3-0 SH 18 (SUTURE) ×3 IMPLANT
SYR BULB 3OZ (MISCELLANEOUS) ×3 IMPLANT
SYR CONTROL 10ML LL (SYRINGE) ×3 IMPLANT
TOWEL OR 17X24 6PK STRL BLUE (TOWEL DISPOSABLE) ×3 IMPLANT
TOWEL OR 17X26 10 PK STRL BLUE (TOWEL DISPOSABLE) ×3 IMPLANT
TUBE CONNECTING 12'X1/4 (SUCTIONS)
TUBE CONNECTING 12X1/4 (SUCTIONS) IMPLANT
YANKAUER SUCT BULB TIP NO VENT (SUCTIONS) ×3 IMPLANT

## 2018-06-01 NOTE — Transfer of Care (Signed)
Immediate Anesthesia Transfer of Care Note  Patient: Terry Juarez  Procedure(s) Performed: REMOVAL OF PERITONEAL DIALYSIS CATHETER (N/A Abdomen)  Patient Location: PACU  Anesthesia Type:General  Level of Consciousness: awake, alert  and oriented  Airway & Oxygen Therapy: Patient Spontanous Breathing and Patient connected to face mask oxygen  Post-op Assessment: Report given to RN, Post -op Vital signs reviewed and stable and Patient moving all extremities X 4  Post vital signs: Reviewed and stable  Last Vitals:  Vitals Value Taken Time  BP 153/88 06/01/2018 11:13 AM  Temp    Pulse 76 06/01/2018 11:14 AM  Resp 15 06/01/2018 11:14 AM  SpO2 100 % 06/01/2018 11:14 AM  Vitals shown include unvalidated device data.  Last Pain:  Vitals:   06/01/18 0809  TempSrc: Oral  PainSc: 0-No pain      Patients Stated Pain Goal: 0 (62/56/38 9373)  Complications: No apparent anesthesia complications

## 2018-06-01 NOTE — Anesthesia Postprocedure Evaluation (Signed)
Anesthesia Post Note  Patient: Terry Juarez  Procedure(s) Performed: REMOVAL OF PERITONEAL DIALYSIS CATHETER (N/A Abdomen)     Patient location during evaluation: PACU Anesthesia Type: General Level of consciousness: awake and alert Pain management: pain level controlled Vital Signs Assessment: post-procedure vital signs reviewed and stable Respiratory status: spontaneous breathing, nonlabored ventilation, respiratory function stable and patient connected to nasal cannula oxygen Cardiovascular status: blood pressure returned to baseline and stable Postop Assessment: no apparent nausea or vomiting Anesthetic complications: no    Last Vitals:  Vitals:   06/01/18 1155 06/01/18 1210  BP: 117/77 138/79  Pulse:  (!) 58  Resp:  14  Temp:    SpO2: 98% 98%    Last Pain:  Vitals:   06/01/18 1210  TempSrc:   PainSc: 0-No pain                 Aleksey Newbern COKER

## 2018-06-01 NOTE — Anesthesia Preprocedure Evaluation (Addendum)
Anesthesia Evaluation  Patient identified by MRN, date of birth, ID band Patient awake    Reviewed: Allergy & Precautions, NPO status , Patient's Chart, lab work & pertinent test results  Airway Mallampati: II  TM Distance: >3 FB Neck ROM: Full    Dental  (+) Teeth Intact, Dental Advisory Given   Pulmonary    breath sounds clear to auscultation       Cardiovascular hypertension,  Rhythm:Regular Rate:Normal     Neuro/Psych    GI/Hepatic   Endo/Other    Renal/GU      Musculoskeletal   Abdominal   Peds  Hematology   Anesthesia Other Findings R. Sided weakness, R. Facial droop  Reproductive/Obstetrics                             Anesthesia Physical Anesthesia Plan  ASA: III  Anesthesia Plan:    Post-op Pain Management:    Induction: Intravenous  PONV Risk Score and Plan: Ondansetron  Airway Management Planned: Oral ETT  Additional Equipment:   Intra-op Plan:   Post-operative Plan: Extubation in OR  Informed Consent: I have reviewed the patients History and Physical, chart, labs and discussed the procedure including the risks, benefits and alternatives for the proposed anesthesia with the patient or authorized representative who has indicated his/her understanding and acceptance.     Plan Discussed with: CRNA and Anesthesiologist  Anesthesia Plan Comments:         Anesthesia Quick Evaluation

## 2018-06-01 NOTE — Op Note (Signed)
Operative Note  JILLANE PO  [id number]  [id number]  06/01/2018   Surgeon: Vikki Ports A ConnorMD  Assistant: none  Procedure performed: removal of tunneled peritoneal dialysis catheter  Preop diagnosis: end-stage renal disease, nonfunctioning PD catheter Post-op diagnosis/intraop findings: same  Specimens: none Retained items: none EBL: minimal cc Complications: none  Description of procedure: After obtaining informed consent the patient was taken to the operating room and placed supine on operating room table wheregeneral endotracheal anesthesia was initiated, preoperative antibiotics were administered, SCDs applied, and a formal timeout was performed. The patient's abdomen was prepped and draped in usual sterile fashion. After infiltration with local, the prior vertical right paramedian incision was incised sharply and then cautery was used to dissect down through the soft tissues until the catheter was encountered. Applying gentle traction on the catheter, cautery was then used to divide the scar tissue around the intramuscular cuff until the peritoneal component of the catheter was easily delivered into the field. This was inspected and noted to be clearly intact. This component along with its cuff was transected from the subcutaneous portion and removed from the field. We then turned to the skin exit site in the right lower quadrant where again cautery was used to divide the scar tissue away from the subcutaneous cuff until the subcutaneous portion of the catheter could be removed from the patient and taken from the field. Hemostasis was ensured within each wound. The anterior rectus sheath was then closed vertically with interrupted 0 Vicryl sutures. The Scarpa's fascia was closed with interrupted 3-0 Vicryl sutures. The 2 skin wounds were then closed with subcuticular Monocryl- running fashion on the paramedian incision and interrupted at the skin exit site. Dermabond was then  applied. The patient was then awakened, extubated and taken to PACU in stable condition.   All counts were correct at the completion of the case.

## 2018-06-01 NOTE — Discharge Instructions (Signed)
GENERAL SURGERY: POST OP INSTRUCTIONS  ######################################################################  EAT Gradually transition to a high fiber diet with a fiber supplement over the next few weeks after discharge.  Start with a pureed / full liquid diet (see below)  WALK Walk an hour a day.  Control your pain to do that.    CONTROL PAIN Control pain so that you can walk, sleep, tolerate sneezing/coughing, go up/down stairs.  HAVE A BOWEL MOVEMENT DAILY Keep your bowels regular to avoid problems.  OK to try a laxative to override constipation.  OK to use an antidairrheal to slow down diarrhea.  Call if not better after 2 tries  CALL IF YOU HAVE PROBLEMS/CONCERNS Call if you are still struggling despite following these instructions. Call if you have concerns not answered by these instructions  ######################################################################    1. DIET: Follow a light bland diet the first 24 hours after arrival home, such as soup, liquids, crackers, etc.  Be sure to include lots of fluids daily.  Avoid fast food or heavy meals as your are more likely to get nauseated.   2. Take your usually prescribed home medications unless otherwise directed. 3. PAIN CONTROL: a. Pain is best controlled by a usual combination of three different methods TOGETHER: i. Ice/Heat ii. Over the counter pain medication iii. Prescription pain medication b. Most patients will experience some swelling and bruising around the incisions.  Ice packs or heating pads (30-60 minutes up to 6 times a day) will help. Use ice for the first few days to help decrease swelling and bruising, then switch to heat to help relax tight/sore spots and speed recovery.  Some people prefer to use ice alone, heat alone, alternating between ice & heat.  Experiment to what works for you.  Swelling and bruising can take several weeks to resolve.   c. It is helpful to take an over-the-counter pain medication  regularly for the first few weeks.  Choose one of the following that works best for you: i. Naproxen (Aleve, etc)  Two 220mg  tabs twice a day ii. Ibuprofen (Advil, etc) Three 200mg  tabs four times a day (every meal & bedtime) iii. Acetaminophen (Tylenol, etc) 500-650mg  four times a day (every meal & bedtime) d. A  prescription for pain medication (such as oxycodone, hydrocodone, etc) should be given to you upon discharge.  Take your pain medication as prescribed.  i. If you are having problems/concerns with the prescription medicine (does not control pain, nausea, vomiting, rash, itching, etc), please call us [phone] to see if we need to switch you to a different pain medicine that will work better for you and/or control your side effect better. ii. If you need a refill on your pain medication, please contact your pharmacy.  They will contact our office to request authorization. Prescriptions will not be filled after 5 pm or on week-ends. 4. Avoid getting constipated.  Between the surgery and the pain medications, it is common to experience some constipation.  Increasing fluid intake and taking a fiber supplement (such as Metamucil, Citrucel, FiberCon, MiraLax, etc) 1-2 times a day regularly will usually help prevent this problem from occurring.  A mild laxative (prune juice, Milk of Magnesia, MiraLax, etc) should be taken according to package directions if there are no bowel movements after 48 hours.   5. Wash / shower every day.  You may shower over the skin glue which is waterproof.  Continue to shower over incision(s) after the dressing is off. No soaking or swimming for  2 weeks.  6. Skin glue will flake off after 2 weeks.  You may leave the incision open to air.  You may have skin tapes (Steri Strips) covering the incision(s).  Leave them on until one week, then remove.  You may replace a dressing/Band-Aid to cover the incision for comfort if you wish.      7. ACTIVITIES as tolerated:    a. You may resume regular (light) daily activities beginning the next day--such as daily self-care, walking, climbing stairs--gradually increasing activities as tolerated.  If you can walk 30 minutes without difficulty, it is safe to try more intense activity such as jogging, treadmill, bicycling, low-impact aerobics, swimming, etc. b. Save the most intensive and strenuous activity for last such as sit-ups, heavy lifting, contact sports, etc  Refrain from any heavy lifting or straining until you are off narcotics for pain control.   c. DO NOT PUSH THROUGH PAIN.  Let pain be your guide: If it hurts to do something, don't do it.  Pain is your body warning you to avoid that activity for another week until the pain goes down. d. You may drive when you are no longer taking prescription pain medication, you can comfortably wear a seatbelt, and you can safely maneuver your car and apply brakes. e. Dennis Bast may have sexual intercourse when it is comfortable.  8. FOLLOW UP in our office a. Please call [facility] at [phone] to set up an appointment to see your surgeon in the office for a follow-up appointment approximately 2-3 weeks after your surgery. b. Make sure that you call for this appointment the day you arrive home to insure a convenient appointment time. 9. IF YOU HAVE DISABILITY OR FAMILY LEAVE FORMS, BRING THEM TO THE OFFICE FOR PROCESSING.  DO NOT GIVE THEM TO YOUR DOCTOR.   WHEN TO CALL us [phone]: 1. Poor pain control 2. Reactions / problems with new medications (rash/itching, nausea, etc)  3. Fever over 101.5 F (38.5 C) 4. Worsening swelling or bruising 5. Continued bleeding from incision. 6. Increased pain, redness, or drainage from the incision 7. Difficulty breathing / swallowing   The clinic staff is available to answer your questions during regular business hours (8:30am-5pm).  Please dont hesitate to call and ask to speak to one of our nurses for clinical concerns.   If you have  a medical emergency, go to the nearest emergency room or call 911.  A surgeon from [facility] is always on call at the [facility], [place], [place], [place], [place]  [address] ? MAIN: [phone] ? TOLL FREE: [phone] ?  FAX [phone] [email or web address]

## 2018-06-01 NOTE — Anesthesia Procedure Notes (Addendum)
Procedure Name: Intubation Date/Time: 06/01/2018 10:18 AM Performed by: Roberts Gaudy, MD Pre-anesthesia Checklist: Patient identified, Emergency Drugs available, Suction available and Patient being monitored Patient Re-evaluated:Patient Re-evaluated prior to induction Oxygen Delivery Method: Circle System Utilized Preoxygenation: Pre-oxygenation with 100% oxygen Induction Type: IV induction Ventilation: Mask ventilation without difficulty Laryngoscope Size: Miller and 2 Grade View: Grade I Tube type: Oral Tube size: 7.0 mm Number of attempts: 2 (MAC 3 grade II, Mil 2 grade I) Airway Equipment and Method: Stylet and Oral airway Placement Confirmation: ETT inserted through vocal cords under direct vision,  positive ETCO2 and breath sounds checked- equal and bilateral Secured at: 21 cm Tube secured with: Tape Dental Injury: Teeth and Oropharynx as per pre-operative assessment

## 2018-06-01 NOTE — Interval H&P Note (Signed)
History and Physical Interval Note:  06/01/2018 9:37 AM  Terry Juarez  has presented today for surgery, with the diagnosis of ESRD  The various methods of treatment have been discussed with the patient and family. After consideration of risks, benefits and other options for treatment, the patient has consented to  Procedure(s): REMOVAL OF PERITONEAL DIALYSIS CATHETER (N/A) as a surgical intervention .  The patient's history has been reviewed, patient examined, no change in status, stable for surgery.  I have reviewed the patient's chart and labs.  Questions were answered to the patient's satisfaction.     [name] Rich Brave

## 2018-06-02 ENCOUNTER — Encounter (HOSPITAL_COMMUNITY): Payer: Self-pay | Admitting: Surgery

## 2018-06-03 DIAGNOSIS — E875 Hyperkalemia: Secondary | ICD-10-CM | POA: Diagnosis not present

## 2018-06-03 DIAGNOSIS — N186 End stage renal disease: Secondary | ICD-10-CM | POA: Diagnosis not present

## 2018-06-03 DIAGNOSIS — N2581 Secondary hyperparathyroidism of renal origin: Secondary | ICD-10-CM | POA: Diagnosis not present

## 2018-06-03 DIAGNOSIS — D631 Anemia in chronic kidney disease: Secondary | ICD-10-CM | POA: Diagnosis not present

## 2018-06-03 DIAGNOSIS — Z79899 Other long term (current) drug therapy: Secondary | ICD-10-CM | POA: Diagnosis not present

## 2018-06-03 DIAGNOSIS — D509 Iron deficiency anemia, unspecified: Secondary | ICD-10-CM | POA: Diagnosis not present

## 2018-06-04 DIAGNOSIS — Z79899 Other long term (current) drug therapy: Secondary | ICD-10-CM | POA: Diagnosis not present

## 2018-06-04 DIAGNOSIS — N2581 Secondary hyperparathyroidism of renal origin: Secondary | ICD-10-CM | POA: Diagnosis not present

## 2018-06-04 DIAGNOSIS — D509 Iron deficiency anemia, unspecified: Secondary | ICD-10-CM | POA: Diagnosis not present

## 2018-06-04 DIAGNOSIS — E875 Hyperkalemia: Secondary | ICD-10-CM | POA: Diagnosis not present

## 2018-06-04 DIAGNOSIS — D631 Anemia in chronic kidney disease: Secondary | ICD-10-CM | POA: Diagnosis not present

## 2018-06-04 DIAGNOSIS — N186 End stage renal disease: Secondary | ICD-10-CM | POA: Diagnosis not present

## 2018-06-05 DIAGNOSIS — N041 Nephrotic syndrome with focal and segmental glomerular lesions: Secondary | ICD-10-CM | POA: Diagnosis not present

## 2018-06-05 DIAGNOSIS — Z992 Dependence on renal dialysis: Secondary | ICD-10-CM | POA: Diagnosis not present

## 2018-06-05 DIAGNOSIS — N186 End stage renal disease: Secondary | ICD-10-CM | POA: Diagnosis not present

## 2018-06-06 DIAGNOSIS — D631 Anemia in chronic kidney disease: Secondary | ICD-10-CM | POA: Diagnosis not present

## 2018-06-06 DIAGNOSIS — E890 Postprocedural hypothyroidism: Secondary | ICD-10-CM | POA: Diagnosis not present

## 2018-06-06 DIAGNOSIS — N186 End stage renal disease: Secondary | ICD-10-CM | POA: Diagnosis not present

## 2018-06-06 DIAGNOSIS — Z5189 Encounter for other specified aftercare: Secondary | ICD-10-CM | POA: Diagnosis not present

## 2018-06-06 DIAGNOSIS — N2581 Secondary hyperparathyroidism of renal origin: Secondary | ICD-10-CM | POA: Diagnosis not present

## 2018-06-06 DIAGNOSIS — R17 Unspecified jaundice: Secondary | ICD-10-CM | POA: Diagnosis not present

## 2018-06-06 DIAGNOSIS — D509 Iron deficiency anemia, unspecified: Secondary | ICD-10-CM | POA: Diagnosis not present

## 2018-06-06 DIAGNOSIS — E440 Moderate protein-calorie malnutrition: Secondary | ICD-10-CM | POA: Diagnosis not present

## 2018-06-06 DIAGNOSIS — Z79899 Other long term (current) drug therapy: Secondary | ICD-10-CM | POA: Diagnosis not present

## 2018-06-06 DIAGNOSIS — E875 Hyperkalemia: Secondary | ICD-10-CM | POA: Diagnosis not present

## 2018-06-07 DIAGNOSIS — N186 End stage renal disease: Secondary | ICD-10-CM | POA: Diagnosis not present

## 2018-06-07 DIAGNOSIS — D509 Iron deficiency anemia, unspecified: Secondary | ICD-10-CM | POA: Diagnosis not present

## 2018-06-07 DIAGNOSIS — R17 Unspecified jaundice: Secondary | ICD-10-CM | POA: Diagnosis not present

## 2018-06-07 DIAGNOSIS — E440 Moderate protein-calorie malnutrition: Secondary | ICD-10-CM | POA: Diagnosis not present

## 2018-06-07 DIAGNOSIS — E875 Hyperkalemia: Secondary | ICD-10-CM | POA: Diagnosis not present

## 2018-06-07 DIAGNOSIS — Z5189 Encounter for other specified aftercare: Secondary | ICD-10-CM | POA: Diagnosis not present

## 2018-06-08 DIAGNOSIS — Z5189 Encounter for other specified aftercare: Secondary | ICD-10-CM | POA: Diagnosis not present

## 2018-06-08 DIAGNOSIS — D509 Iron deficiency anemia, unspecified: Secondary | ICD-10-CM | POA: Diagnosis not present

## 2018-06-08 DIAGNOSIS — E875 Hyperkalemia: Secondary | ICD-10-CM | POA: Diagnosis not present

## 2018-06-08 DIAGNOSIS — R17 Unspecified jaundice: Secondary | ICD-10-CM | POA: Diagnosis not present

## 2018-06-08 DIAGNOSIS — N186 End stage renal disease: Secondary | ICD-10-CM | POA: Diagnosis not present

## 2018-06-08 DIAGNOSIS — E440 Moderate protein-calorie malnutrition: Secondary | ICD-10-CM | POA: Diagnosis not present

## 2018-06-11 DIAGNOSIS — D509 Iron deficiency anemia, unspecified: Secondary | ICD-10-CM | POA: Diagnosis not present

## 2018-06-11 DIAGNOSIS — N186 End stage renal disease: Secondary | ICD-10-CM | POA: Diagnosis not present

## 2018-06-11 DIAGNOSIS — D631 Anemia in chronic kidney disease: Secondary | ICD-10-CM | POA: Diagnosis not present

## 2018-06-11 DIAGNOSIS — E875 Hyperkalemia: Secondary | ICD-10-CM | POA: Diagnosis not present

## 2018-06-11 DIAGNOSIS — N2581 Secondary hyperparathyroidism of renal origin: Secondary | ICD-10-CM | POA: Diagnosis not present

## 2018-06-11 DIAGNOSIS — E8770 Fluid overload, unspecified: Secondary | ICD-10-CM | POA: Diagnosis not present

## 2018-06-13 DIAGNOSIS — D509 Iron deficiency anemia, unspecified: Secondary | ICD-10-CM | POA: Diagnosis not present

## 2018-06-13 DIAGNOSIS — D631 Anemia in chronic kidney disease: Secondary | ICD-10-CM | POA: Diagnosis not present

## 2018-06-13 DIAGNOSIS — E875 Hyperkalemia: Secondary | ICD-10-CM | POA: Diagnosis not present

## 2018-06-13 DIAGNOSIS — N2581 Secondary hyperparathyroidism of renal origin: Secondary | ICD-10-CM | POA: Diagnosis not present

## 2018-06-13 DIAGNOSIS — E8770 Fluid overload, unspecified: Secondary | ICD-10-CM | POA: Diagnosis not present

## 2018-06-13 DIAGNOSIS — N186 End stage renal disease: Secondary | ICD-10-CM | POA: Diagnosis not present

## 2018-06-15 DIAGNOSIS — D509 Iron deficiency anemia, unspecified: Secondary | ICD-10-CM | POA: Diagnosis not present

## 2018-06-15 DIAGNOSIS — N186 End stage renal disease: Secondary | ICD-10-CM | POA: Diagnosis not present

## 2018-06-15 DIAGNOSIS — E8770 Fluid overload, unspecified: Secondary | ICD-10-CM | POA: Diagnosis not present

## 2018-06-15 DIAGNOSIS — D631 Anemia in chronic kidney disease: Secondary | ICD-10-CM | POA: Diagnosis not present

## 2018-06-15 DIAGNOSIS — N2581 Secondary hyperparathyroidism of renal origin: Secondary | ICD-10-CM | POA: Diagnosis not present

## 2018-06-15 DIAGNOSIS — E875 Hyperkalemia: Secondary | ICD-10-CM | POA: Diagnosis not present

## 2018-06-17 DIAGNOSIS — E8770 Fluid overload, unspecified: Secondary | ICD-10-CM | POA: Diagnosis not present

## 2018-06-17 DIAGNOSIS — E875 Hyperkalemia: Secondary | ICD-10-CM | POA: Diagnosis not present

## 2018-06-17 DIAGNOSIS — N186 End stage renal disease: Secondary | ICD-10-CM | POA: Diagnosis not present

## 2018-06-17 DIAGNOSIS — D631 Anemia in chronic kidney disease: Secondary | ICD-10-CM | POA: Diagnosis not present

## 2018-06-17 DIAGNOSIS — D509 Iron deficiency anemia, unspecified: Secondary | ICD-10-CM | POA: Diagnosis not present

## 2018-06-17 DIAGNOSIS — N2581 Secondary hyperparathyroidism of renal origin: Secondary | ICD-10-CM | POA: Diagnosis not present

## 2018-06-18 DIAGNOSIS — D509 Iron deficiency anemia, unspecified: Secondary | ICD-10-CM | POA: Diagnosis not present

## 2018-06-18 DIAGNOSIS — E8770 Fluid overload, unspecified: Secondary | ICD-10-CM | POA: Diagnosis not present

## 2018-06-18 DIAGNOSIS — E875 Hyperkalemia: Secondary | ICD-10-CM | POA: Diagnosis not present

## 2018-06-18 DIAGNOSIS — D631 Anemia in chronic kidney disease: Secondary | ICD-10-CM | POA: Diagnosis not present

## 2018-06-18 DIAGNOSIS — N186 End stage renal disease: Secondary | ICD-10-CM | POA: Diagnosis not present

## 2018-06-18 DIAGNOSIS — N2581 Secondary hyperparathyroidism of renal origin: Secondary | ICD-10-CM | POA: Diagnosis not present

## 2018-06-20 DIAGNOSIS — D509 Iron deficiency anemia, unspecified: Secondary | ICD-10-CM | POA: Diagnosis not present

## 2018-06-20 DIAGNOSIS — N186 End stage renal disease: Secondary | ICD-10-CM | POA: Diagnosis not present

## 2018-06-20 DIAGNOSIS — E875 Hyperkalemia: Secondary | ICD-10-CM | POA: Diagnosis not present

## 2018-06-20 DIAGNOSIS — N2581 Secondary hyperparathyroidism of renal origin: Secondary | ICD-10-CM | POA: Diagnosis not present

## 2018-06-20 DIAGNOSIS — E8770 Fluid overload, unspecified: Secondary | ICD-10-CM | POA: Diagnosis not present

## 2018-06-20 DIAGNOSIS — D631 Anemia in chronic kidney disease: Secondary | ICD-10-CM | POA: Diagnosis not present

## 2018-06-21 DIAGNOSIS — N2581 Secondary hyperparathyroidism of renal origin: Secondary | ICD-10-CM | POA: Diagnosis not present

## 2018-06-21 DIAGNOSIS — E875 Hyperkalemia: Secondary | ICD-10-CM | POA: Diagnosis not present

## 2018-06-21 DIAGNOSIS — N186 End stage renal disease: Secondary | ICD-10-CM | POA: Diagnosis not present

## 2018-06-21 DIAGNOSIS — D509 Iron deficiency anemia, unspecified: Secondary | ICD-10-CM | POA: Diagnosis not present

## 2018-06-21 DIAGNOSIS — E8770 Fluid overload, unspecified: Secondary | ICD-10-CM | POA: Diagnosis not present

## 2018-06-21 DIAGNOSIS — D631 Anemia in chronic kidney disease: Secondary | ICD-10-CM | POA: Diagnosis not present

## 2018-06-22 DIAGNOSIS — E8770 Fluid overload, unspecified: Secondary | ICD-10-CM | POA: Diagnosis not present

## 2018-06-22 DIAGNOSIS — N186 End stage renal disease: Secondary | ICD-10-CM | POA: Diagnosis not present

## 2018-06-22 DIAGNOSIS — D631 Anemia in chronic kidney disease: Secondary | ICD-10-CM | POA: Diagnosis not present

## 2018-06-22 DIAGNOSIS — D509 Iron deficiency anemia, unspecified: Secondary | ICD-10-CM | POA: Diagnosis not present

## 2018-06-22 DIAGNOSIS — N2581 Secondary hyperparathyroidism of renal origin: Secondary | ICD-10-CM | POA: Diagnosis not present

## 2018-06-22 DIAGNOSIS — E875 Hyperkalemia: Secondary | ICD-10-CM | POA: Diagnosis not present

## 2018-06-24 DIAGNOSIS — G6289 Other specified polyneuropathies: Secondary | ICD-10-CM | POA: Diagnosis not present

## 2018-06-24 DIAGNOSIS — Z6820 Body mass index (BMI) 20.0-20.9, adult: Secondary | ICD-10-CM | POA: Diagnosis not present

## 2018-06-24 DIAGNOSIS — D631 Anemia in chronic kidney disease: Secondary | ICD-10-CM | POA: Diagnosis not present

## 2018-06-24 DIAGNOSIS — N2581 Secondary hyperparathyroidism of renal origin: Secondary | ICD-10-CM | POA: Diagnosis not present

## 2018-06-24 DIAGNOSIS — D509 Iron deficiency anemia, unspecified: Secondary | ICD-10-CM | POA: Diagnosis not present

## 2018-06-24 DIAGNOSIS — E7800 Pure hypercholesterolemia, unspecified: Secondary | ICD-10-CM | POA: Diagnosis not present

## 2018-06-24 DIAGNOSIS — I10 Essential (primary) hypertension: Secondary | ICD-10-CM | POA: Diagnosis not present

## 2018-06-24 DIAGNOSIS — Z992 Dependence on renal dialysis: Secondary | ICD-10-CM | POA: Diagnosis not present

## 2018-06-24 DIAGNOSIS — N186 End stage renal disease: Secondary | ICD-10-CM | POA: Diagnosis not present

## 2018-06-24 DIAGNOSIS — G8194 Hemiplegia, unspecified affecting left nondominant side: Secondary | ICD-10-CM | POA: Diagnosis not present

## 2018-06-24 DIAGNOSIS — E8770 Fluid overload, unspecified: Secondary | ICD-10-CM | POA: Diagnosis not present

## 2018-06-24 DIAGNOSIS — E875 Hyperkalemia: Secondary | ICD-10-CM | POA: Diagnosis not present

## 2018-06-24 DIAGNOSIS — R808 Other proteinuria: Secondary | ICD-10-CM | POA: Diagnosis not present

## 2018-06-25 DIAGNOSIS — E8770 Fluid overload, unspecified: Secondary | ICD-10-CM | POA: Diagnosis not present

## 2018-06-25 DIAGNOSIS — D631 Anemia in chronic kidney disease: Secondary | ICD-10-CM | POA: Diagnosis not present

## 2018-06-25 DIAGNOSIS — N186 End stage renal disease: Secondary | ICD-10-CM | POA: Diagnosis not present

## 2018-06-25 DIAGNOSIS — E875 Hyperkalemia: Secondary | ICD-10-CM | POA: Diagnosis not present

## 2018-06-25 DIAGNOSIS — D509 Iron deficiency anemia, unspecified: Secondary | ICD-10-CM | POA: Diagnosis not present

## 2018-06-25 DIAGNOSIS — N2581 Secondary hyperparathyroidism of renal origin: Secondary | ICD-10-CM | POA: Diagnosis not present

## 2018-06-27 DIAGNOSIS — D509 Iron deficiency anemia, unspecified: Secondary | ICD-10-CM | POA: Diagnosis not present

## 2018-06-27 DIAGNOSIS — D631 Anemia in chronic kidney disease: Secondary | ICD-10-CM | POA: Diagnosis not present

## 2018-06-27 DIAGNOSIS — E8770 Fluid overload, unspecified: Secondary | ICD-10-CM | POA: Diagnosis not present

## 2018-06-27 DIAGNOSIS — N2581 Secondary hyperparathyroidism of renal origin: Secondary | ICD-10-CM | POA: Diagnosis not present

## 2018-06-27 DIAGNOSIS — E875 Hyperkalemia: Secondary | ICD-10-CM | POA: Diagnosis not present

## 2018-06-27 DIAGNOSIS — N186 End stage renal disease: Secondary | ICD-10-CM | POA: Diagnosis not present

## 2018-06-29 DIAGNOSIS — N2581 Secondary hyperparathyroidism of renal origin: Secondary | ICD-10-CM | POA: Diagnosis not present

## 2018-06-29 DIAGNOSIS — N186 End stage renal disease: Secondary | ICD-10-CM | POA: Diagnosis not present

## 2018-06-29 DIAGNOSIS — E8770 Fluid overload, unspecified: Secondary | ICD-10-CM | POA: Diagnosis not present

## 2018-06-29 DIAGNOSIS — D509 Iron deficiency anemia, unspecified: Secondary | ICD-10-CM | POA: Diagnosis not present

## 2018-06-29 DIAGNOSIS — E875 Hyperkalemia: Secondary | ICD-10-CM | POA: Diagnosis not present

## 2018-06-29 DIAGNOSIS — D631 Anemia in chronic kidney disease: Secondary | ICD-10-CM | POA: Diagnosis not present

## 2018-07-01 DIAGNOSIS — D509 Iron deficiency anemia, unspecified: Secondary | ICD-10-CM | POA: Diagnosis not present

## 2018-07-01 DIAGNOSIS — D631 Anemia in chronic kidney disease: Secondary | ICD-10-CM | POA: Diagnosis not present

## 2018-07-01 DIAGNOSIS — N2581 Secondary hyperparathyroidism of renal origin: Secondary | ICD-10-CM | POA: Diagnosis not present

## 2018-07-01 DIAGNOSIS — E8770 Fluid overload, unspecified: Secondary | ICD-10-CM | POA: Diagnosis not present

## 2018-07-01 DIAGNOSIS — N186 End stage renal disease: Secondary | ICD-10-CM | POA: Diagnosis not present

## 2018-07-01 DIAGNOSIS — E875 Hyperkalemia: Secondary | ICD-10-CM | POA: Diagnosis not present

## 2018-07-02 DIAGNOSIS — D631 Anemia in chronic kidney disease: Secondary | ICD-10-CM | POA: Diagnosis not present

## 2018-07-02 DIAGNOSIS — N186 End stage renal disease: Secondary | ICD-10-CM | POA: Diagnosis not present

## 2018-07-02 DIAGNOSIS — E875 Hyperkalemia: Secondary | ICD-10-CM | POA: Diagnosis not present

## 2018-07-02 DIAGNOSIS — E8770 Fluid overload, unspecified: Secondary | ICD-10-CM | POA: Diagnosis not present

## 2018-07-02 DIAGNOSIS — D509 Iron deficiency anemia, unspecified: Secondary | ICD-10-CM | POA: Diagnosis not present

## 2018-07-02 DIAGNOSIS — N2581 Secondary hyperparathyroidism of renal origin: Secondary | ICD-10-CM | POA: Diagnosis not present

## 2018-07-02 IMAGING — CR DG CHEST 2V
2 series · 2 of 2 positions shown · non-contrast
Comparison: 09/24/2017

CLINICAL DATA: Recent fevers following peritoneal dialysis

EXAM:
CHEST - 2 VIEW

[chest pa]
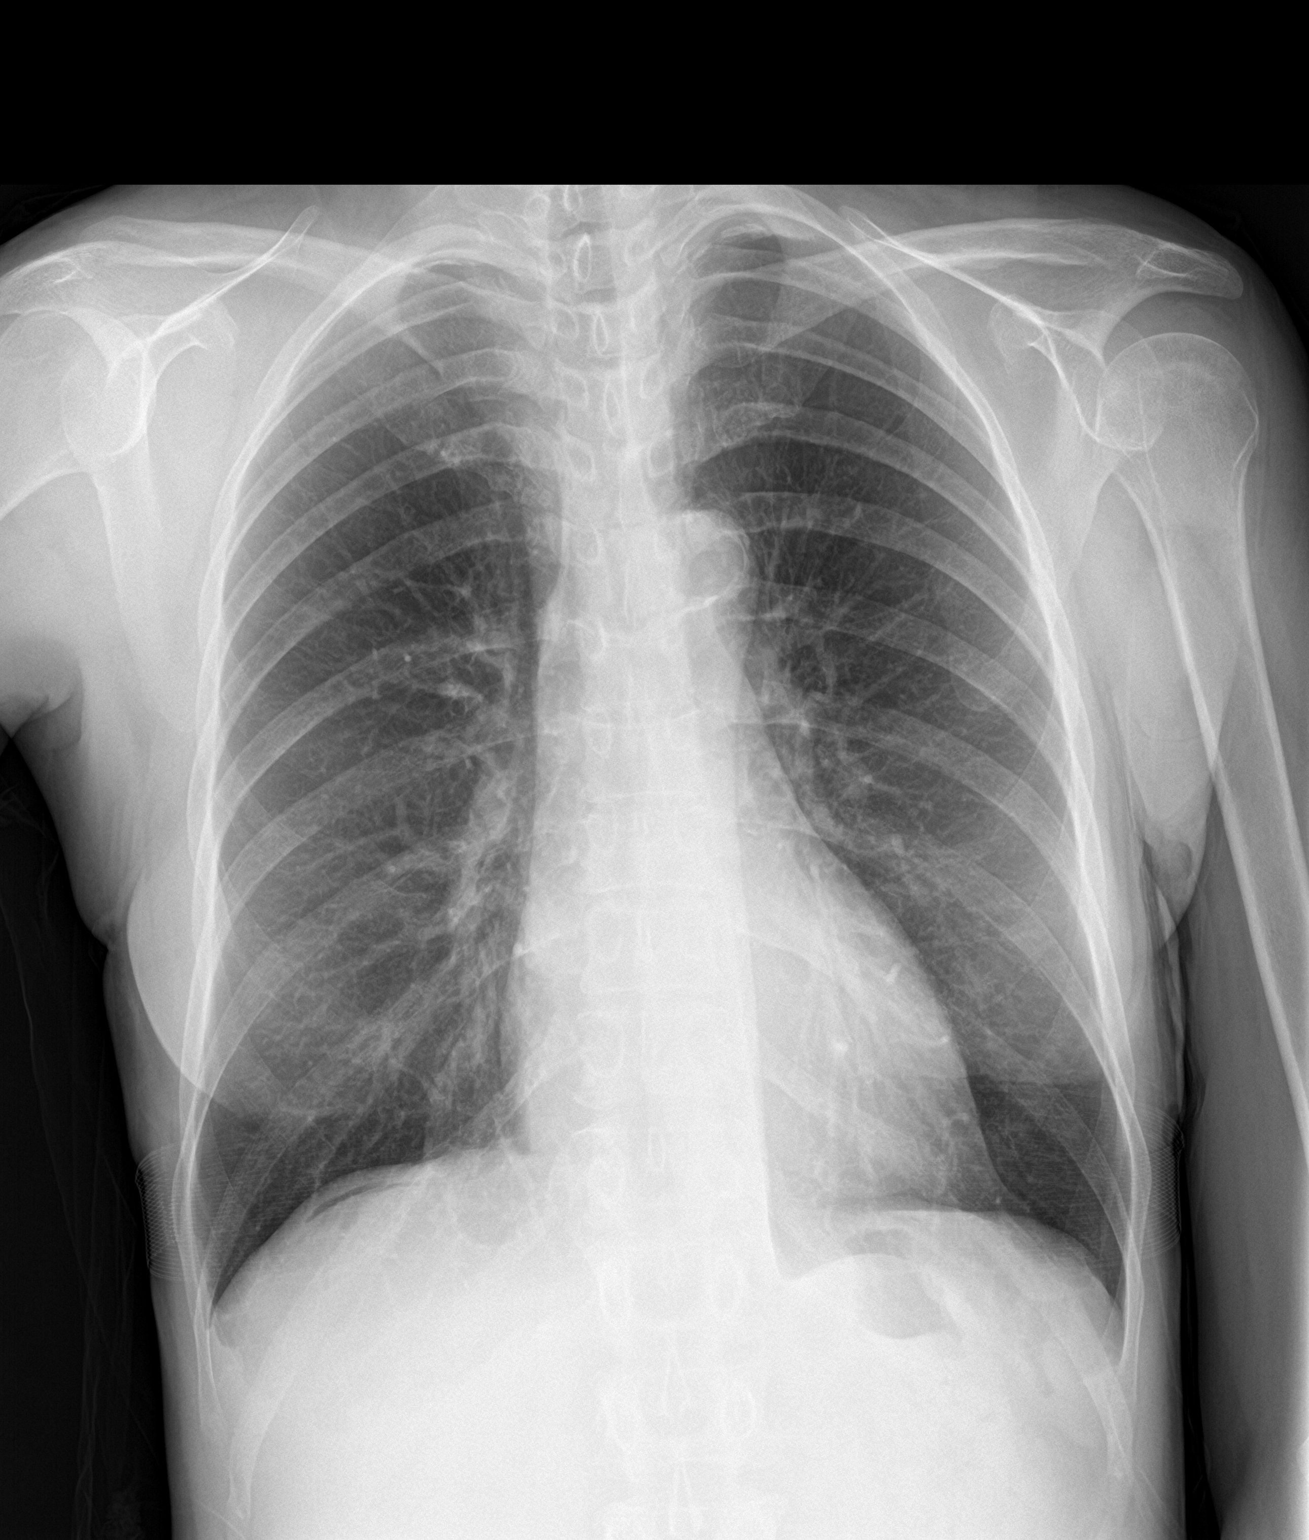

[chest lat]
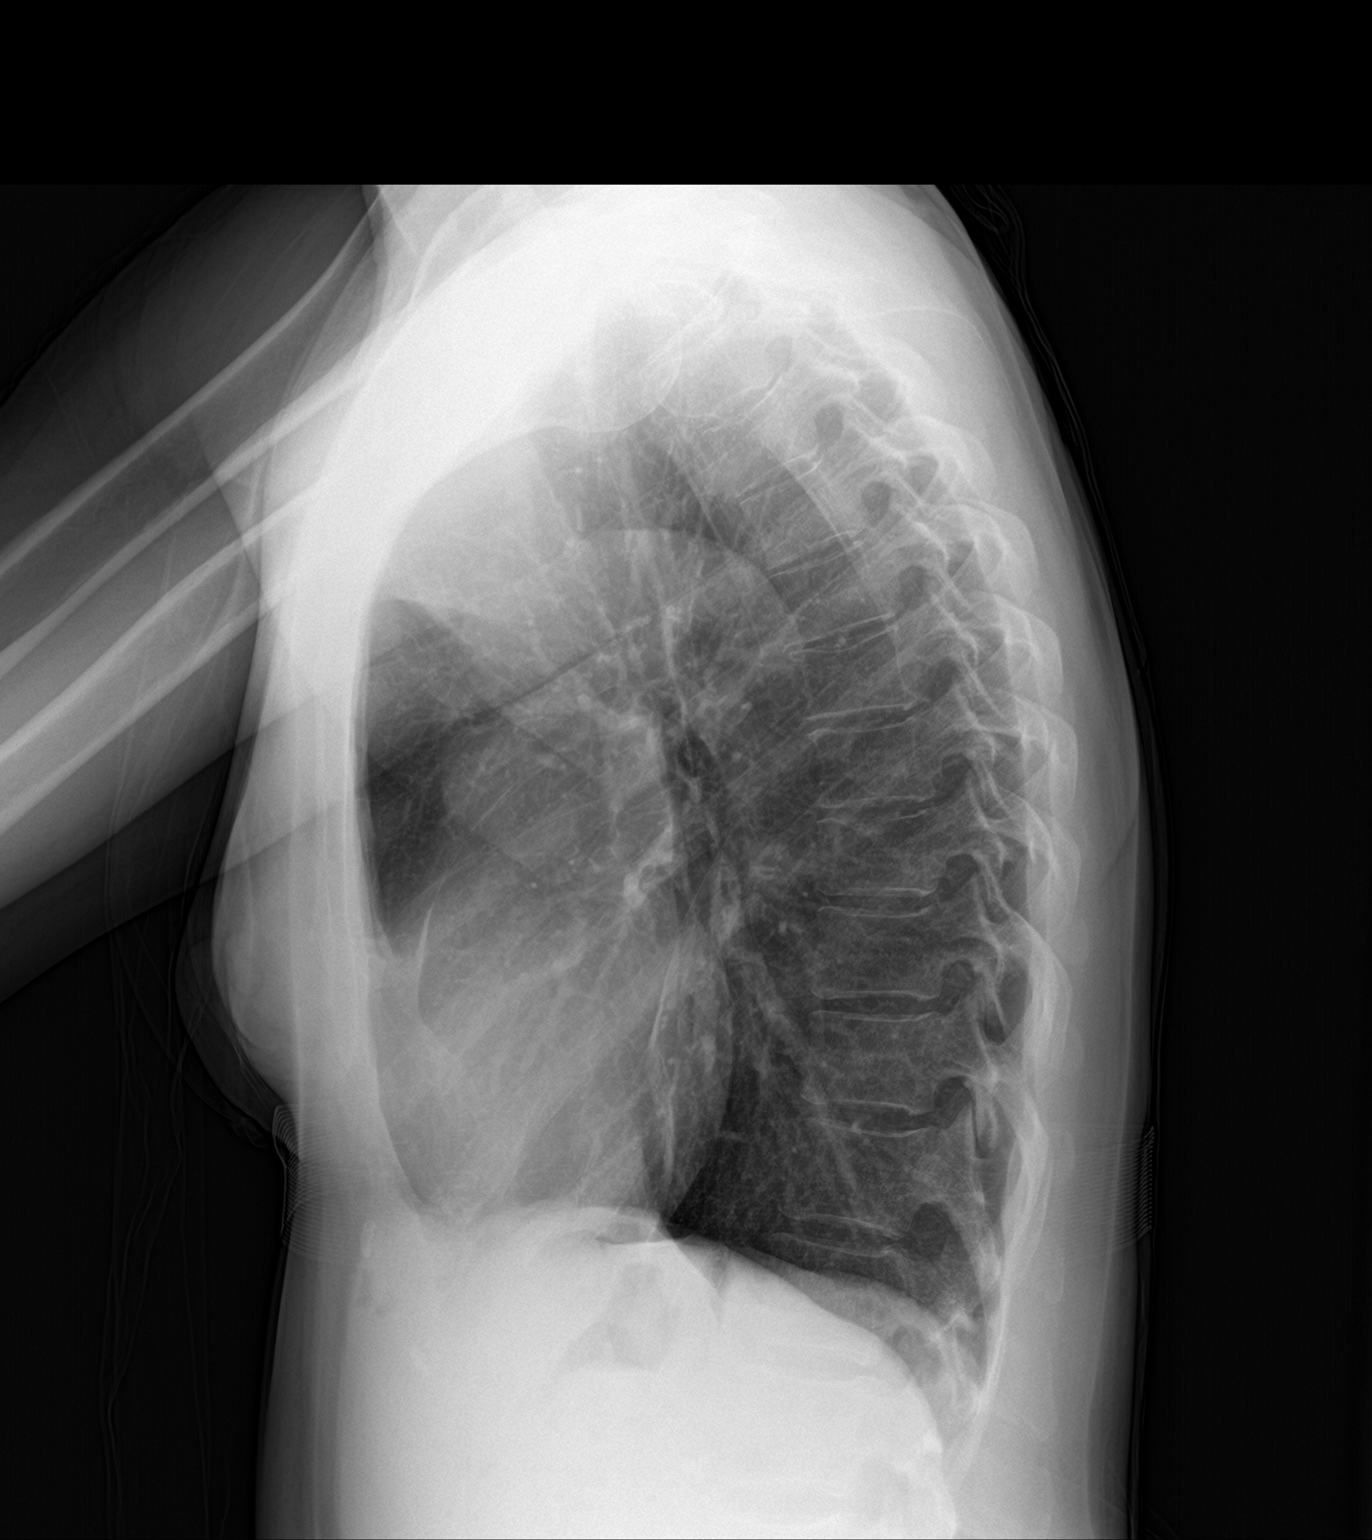

[2 of 2 positions shown; findings below may reference images not displayed]

FINDINGS: Cardiac shadow is within normal limits. Aortic calcifications are
again seen. Previously seen nodular density over the left base is no
longer identified and was likely artifactual in nature. No focal
infiltrate or sizable effusion is seen. Hyperinflation is noted.
Minimal free air is seen related to the peritoneal dialysis
catheter.
IMPRESSION: COPD without acute abnormality.

## 2018-07-04 ENCOUNTER — Ambulatory Visit: Payer: BLUE CROSS/BLUE SHIELD | Admitting: Adult Health

## 2018-07-04 ENCOUNTER — Ambulatory Visit (INDEPENDENT_AMBULATORY_CARE_PROVIDER_SITE_OTHER): Payer: BLUE CROSS/BLUE SHIELD | Admitting: Adult Health

## 2018-07-04 ENCOUNTER — Encounter: Payer: Self-pay | Admitting: Adult Health

## 2018-07-04 VITALS — BP 153/88 | HR 66 | Wt 127.0 lb

## 2018-07-04 DIAGNOSIS — N2581 Secondary hyperparathyroidism of renal origin: Secondary | ICD-10-CM | POA: Diagnosis not present

## 2018-07-04 DIAGNOSIS — R200 Anesthesia of skin: Secondary | ICD-10-CM

## 2018-07-04 DIAGNOSIS — E8770 Fluid overload, unspecified: Secondary | ICD-10-CM | POA: Diagnosis not present

## 2018-07-04 DIAGNOSIS — R569 Unspecified convulsions: Secondary | ICD-10-CM | POA: Diagnosis not present

## 2018-07-04 DIAGNOSIS — D631 Anemia in chronic kidney disease: Secondary | ICD-10-CM | POA: Diagnosis not present

## 2018-07-04 DIAGNOSIS — N2889 Other specified disorders of kidney and ureter: Secondary | ICD-10-CM

## 2018-07-04 DIAGNOSIS — I151 Hypertension secondary to other renal disorders: Secondary | ICD-10-CM

## 2018-07-04 DIAGNOSIS — E785 Hyperlipidemia, unspecified: Secondary | ICD-10-CM

## 2018-07-04 DIAGNOSIS — N186 End stage renal disease: Secondary | ICD-10-CM | POA: Diagnosis not present

## 2018-07-04 DIAGNOSIS — D509 Iron deficiency anemia, unspecified: Secondary | ICD-10-CM | POA: Diagnosis not present

## 2018-07-04 DIAGNOSIS — R202 Paresthesia of skin: Secondary | ICD-10-CM

## 2018-07-04 DIAGNOSIS — E875 Hyperkalemia: Secondary | ICD-10-CM | POA: Diagnosis not present

## 2018-07-04 MED ORDER — LEVETIRACETAM 250 MG PO TABS: 250.0000 mg | ORAL_TABLET | ORAL | 2 refills | Status: DC | PRN

## 2018-07-04 NOTE — Progress Notes (Signed)
STROKE NEUROLOGY FOLLOW UP NOTE  NAME: Terry Juarez DOB: [birth year]  REASON FOR VISIT: stroke follow up HISTORY FROM: pt and chart  Today we had the pleasure of seeing Terry Juarez in follow-up at our Neurology Clinic. Pt was accompanied by no one.   History Summary [name] is a 52 y.o. female with history of a renal cell carcinoma s/p right nephrectomy, hypertension, end-stage renal disease on peritoneal dialysis, hyperlipidemia, and anemia admitted on 12/19/16 for seizure, left-sided weakness, and right frontal headache. CT head showed right posterior frontal lobe acute ICH. MRI head consistent with PRES. CTA unremarkable except FMD of bilateral cervical ICAs. Repeat head CT with stable hematoma. EF 60-65%. EEG diffuse slowing, no seizure activity, put on Keppra. BP eventually controlled with multiple PO meds. Attempted twice for HD, however, patient not able to tolerate, the second attempt even caused cardiac arrest s/p intubation. Switched back to PD. Pt condition gradually improved, but still has left hemiplegia. Discharge to CIR.  04/06/17 follow up Dutch Quint - the patient has been doing well. Left hemiplegia much improved. Currently, patient able to walk with cane with left PFO brace. Still has left arm plegia. BP much improved, today 130/86, still on Keppra, no seizures. However, patient has been back to driving although less than 6 months of last seizure. She was told to restrain from driving until 6 months free from seizure. She expressed understanding and willing to do so. Has outpt PT/OT  07/27/2017 visit JX: During the interval time, pt has been doing well.  Follow with PMR, had a Botox at left upper extremity.  Currently left upper and lower extremity further improved strength.  Not driving now due to possible seizure in 12/2016 during hospitalization.  Not continued outpatient PT/OT due to no transportation at this time.  Continued PD at home, blood pressure was low about 2  weeks ago at 80-90s.  About the same time, patient had 2 episode of whole body tremor for 5 minutes each, did not lose consciousness, resolved after laying in bed.  Episode concerning for related to low blood pressure.  Currently blood pressure improved, on the high side, nephrologist is managing the BP meds.  Today in clinic BP 148/97.  Continued on Keppra without side effects.  Interval history 07/04/2018: Patient is being seen today for follow-up visit.  She has complaints of for approximately the last month she has been experiencing right hand numbness and tingling intermittently.  She is right-hand dominant.  She states it is present in the palm of her hand and all 5 fingers.  She denies the symptoms in the past.  Denies any numbness occurring past her wrist, face or right leg.  Denies any weakness.  Denies any headaches.  Denies any other neurological concern.  Symptoms will resolve on their own.  She also has concerns of whole body tremors that lasts for 5 to 10 minutes and occurs a couple times a month.  This has been present since she initially presented with seizures but it was felt as though these could be related to her low blood pressure.  She states blood pressures have been stable at home and has not had any recent hypotension episodes.  She has recently stopped PD day the PD catheter removed on 06/01/2018 and restarted using prior fistula and does hemodialysis treatments at home 4 times weekly.  She has continued on Keppra 500 mg twice daily.  She does state that these events typically occur in the  evening time.  She denies any loss of consciousness during these episodes.  Blood pressure today 153/88.  She does continue to follow with nephrology for BP management.  She has continued on aspirin 81 mg without side effects of bleeding or bruising.  Continues on atorvastatin without side effects myalgias.  No further concerns at this time.  She continues to use cane for ambulation assistance due to  left hemiparesis post stroke.  Denies new or worsening stroke/TIA symptoms.   REVIEW OF SYSTEMS: Full 14 system review of systems performed and notable only for those listed below and in HPI above, all others are negative:  Blurred vision, daytime sleepiness and headache  The following represents the patient's updated allergies and side effects list: Allergies  Allergen Reactions  . Lisinopril Cough    The neurologically relevant items on the patient's problem list were reviewed on today's visit.  Neurologic Examination  A problem focused neurological exam (12 or more points of the single system neurologic examination, vital signs counts as 1 point, cranial nerves count for 8 points) was performed.  Blood pressure (!) 153/88, pulse 66, weight 127 lb (57.6 kg).  General - Well nourished, well developed, frail middle-aged pleasant African-American female, in no apparent distress.  Ophthalmologic -visual fields intact.  No evidence of nystagmus..  Cardiovascular - Regular rate and rhythm with no murmur.  Mental Status -  Level of arousal and orientation to time, place, and person were intact. Language including expression, naming, repetition, comprehension was assessed and found intact. Attention span and concentration were normal. Fund of Knowledge was assessed and was intact.  Cranial Nerves II - XII - II - Visual field intact OU. III, IV, VI - Extraocular movements intact. V - Facial sensation intact bilaterally. VII - left nasolabial fold flattening. VIII - Hearing & vestibular intact bilaterally. X - Palate elevates symmetrically. XI - Chin turning & shoulder shrug intact bilaterally. XII - Tongue protrusion intact.  Motor Strength - The patient's strength was normal in RUE and RLE, however, LUE 3-/5 proximal and 2/5 distal. LLE 4+/5 proximal and 4/5 distal. Bulk was normal and fasciculations were absent.   Motor Tone - Muscle tone was assessed at the neck and appendages  and was increased on the left  Reflexes - The patient's reflexes were 3+ in LUE and LLE and she had no pathological reflexes.  Sensory - Light touch, temperature/pinprick were assessed and were normal.    Coordination - The patient had normal movements in the right hand with no ataxia or dysmetria.  Tremor was absent.  Gait and Station - walk with cane, left hemiparetic gait        Assessment: Malayja Freund is a 52 year old female with right frontal lobe acute ICH with SAH on 12/19/2016 secondary to hypertension with increase in hemorrhage size and MRI evidence of PRES. she also presented with seizure activity at that time and was placed on Keppra.  Vascular risk factors include renal cell carcinoma, HTN, end-stage renal disease on PD, HLD and anemia.  She is being seen today with new complaints of right hand numbness and continued whole body tremor episodes occurring approximately 2 times monthly.   Plan:  - continue aspirin 81 mg and Lipitor for secondary stroke prevention - continue keppra 500 mg twice daily for seizure prophylaxis -Due to continued body tremors, recommend to initiate Keppra 250 mg post dialysis treatments - Please maintain seizure precautions.  - check BP at home and record - continue HD at home and  follow up with nephrology for BP management -EMG/nerve conduction study her right hand numbness as symptoms are most likely related to carpal tunnel syndrome -Maintain strict control of hypertension with blood pressure goal below 130/90, diabetes with hemoglobin A1c goal below 6.5% and cholesterol with LDL cholesterol (bad cholesterol) goal below 70 mg/dL. I also advised the patient to eat a healthy diet with plenty of whole grains, cereals, fruits and vegetables, exercise regularly and maintain ideal body weight.  Followup in the future with me in 3 months or call earlier if needed  I spent more than 25 minutes of face to face time with the patient. Greater than 50% of  time was spent in counseling and coordination of care.  We discussed carpal tunnel syndrome, possible need of additional Keppra dosage due to dialysis for seizure prevention, and continued management of stroke risk factors.  Venancio Poisson, AGNP-BC  [facility] [address] [place], [place] [address]  Phone [phone] Fax [phone] Note: This document was prepared with digital dictation and possible smart phrase technology. Any transcriptional errors that result from this process are unintentional.

## 2018-07-04 NOTE — Patient Instructions (Signed)
Continue aspirin 81 mg daily  and lipitor  for secondary stroke prevention  Continue to follow up with PCP regarding cholesterol and blood pressure management   Start keppra 250mg  tablet after dialysis treatment  Continue currenty dose of keppra 500mg  twice a day in addiontal to the extra tablet after dialysis  We will do nerve conduction study/ EMG due to right hand numbness  Continue to monitor blood pressure at home  Maintain strict control of hypertension with blood pressure goal below 130/90, diabetes with hemoglobin A1c goal below 6.5% and cholesterol with LDL cholesterol (bad cholesterol) goal below 70 mg/dL. I also advised the patient to eat a healthy diet with plenty of whole grains, cereals, fruits and vegetables, exercise regularly and maintain ideal body weight.  Followup in the future with me in 3 months or call earlier if needed       Thank you for coming to see [place] at [facility]. I hope we have been able to provide you high quality care today.  You may receive a patient satisfaction survey over the next few weeks. We would appreciate your feedback and comments so that we may continue to improve ourselves and the health of our patients.

## 2018-07-04 NOTE — Progress Notes (Deleted)
STROKE NEUROLOGY FOLLOW UP NOTE  NAME: Terry Juarez DOB: 13-Dec-1965  REASON FOR VISIT: stroke follow up HISTORY FROM: pt and chart  Today we had the pleasure of seeing Terry Juarez in follow-up at our Neurology Clinic. Pt was accompanied by no one.   History Summary [name] is a 52 y.o. female with history of a renal cell carcinoma s/p right nephrectomy, hypertension, end-stage renal disease on peritoneal dialysis, hyperlipidemia, and anemia admitted on 12/19/16 for seizure, left-sided weakness, and right frontal headache. CT head showed right posterior frontal lobe acute ICH. MRI head consistent with PRES. CTA unremarkable except FMD of bilateral cervical ICAs. Repeat head CT with stable hematoma. EF 60-65%. EEG diffuse slowing, no seizure activity, put on Keppra. BP eventually controlled with multiple PO meds. Attempted twice for HD, however, patient not able to tolerate, the second attempt even caused cardiac arrest s/p intubation. Switched back to PD. Pt condition gradually improved, but still has left hemiplegia. Discharge to CIR.  04/06/17 follow up Terry Juarez - the patient has been doing well. Left hemiplegia much improved. Currently, patient able to walk with cane with left PFO brace. Still has left arm plegia. BP much improved, today 130/86, still on Keppra, no seizures. However, patient has been back to driving although less than 6 months of last seizure. She was told to restrain from driving until 6 months free from seizure. She expressed understanding and willing to do so. Has outpt PT/OT  07/27/2017 visit Terry Juarez: During the interval time, pt has been doing well.  Follow with PMR, had a Botox at left upper extremity.  Currently left upper and lower extremity further improved strength.  Not driving now due to possible seizure in 12/2016 during hospitalization.  Not continued outpatient PT/OT due to no transportation at this time.  Continued PD at home, blood pressure was low about 2  weeks ago at 80-90s.  About the same time, patient had 2 episode of whole body tremor for 5 minutes each, did not lose consciousness, resolved after laying in bed.  Episode concerning for related to low blood pressure.  Currently blood pressure improved, on the high side, nephrologist is managing the BP meds.  Today in clinic BP 148/97.  Continued on Keppra without side effects.  Interval history 07/04/2018: Patient is being seen today for follow-up visit.  She has continued on Keppra without recent seizure activity.  Blood pressure today ***.  She does continue to follow with nephrology for BP management.  She did have peritoneal dialysis catheter removed on 06/01/2018.   REVIEW OF SYSTEMS: Full 14 system review of systems performed and notable only for those listed below and in HPI above, all others are negative:  Constitutional:   Cardiovascular:  Ear/Nose/Throat:   Skin:  Eyes:   Respiratory: Cough, wheezing, choking Gastroitestinal:   Genitourinary:  Hematology/Lymphatic: Bruising easily, anemia Endocrine: Excessive eating Musculoskeletal:   Allergy/Immunology:   Neurological: Headache Psychiatric:  Sleep:   The following represents the patient's updated allergies and side effects list: Allergies  Allergen Reactions  . Lisinopril Cough    The neurologically relevant items on the patient's problem list were reviewed on today's visit.  Neurologic Examination  A problem focused neurological exam (12 or more points of the single system neurologic examination, vital signs counts as 1 point, cranial nerves count for 8 points) was performed.  There were no vitals taken for this visit.  General - Well nourished, well developed, in no apparent distress.  Ophthalmologic -  Fundi not visualized due to noncooperation.  Cardiovascular - Regular rate and rhythm with no murmur.  Mental Status -  Level of arousal and orientation to time, place, and person were intact. Language  including expression, naming, repetition, comprehension was assessed and found intact. Attention span and concentration were normal. Fund of Knowledge was assessed and was intact.  Cranial Nerves II - XII - II - Visual field intact OU. III, IV, VI - Extraocular movements intact. V - Facial sensation intact bilaterally. VII - left nasolabial fold flattening. VIII - Hearing & vestibular intact bilaterally. X - Palate elevates symmetrically. XI - Chin turning & shoulder shrug intact bilaterally. XII - Tongue protrusion intact.  Motor Strength - The patient's strength was normal in RUE and RLE, however, LUE 3-/5 proximal and 2/5 distal. LLE 4+/5 proximal and 3/5 distal. Bulk was normal and fasciculations were absent.   Motor Tone - Muscle tone was assessed at the neck and appendages and was increased on the left  Reflexes - The patient's reflexes were 3+ in LUE and LLE and she had no pathological reflexes.  Sensory - Light touch, temperature/pinprick were assessed and were normal.    Coordination - The patient had normal movements in the right hand with no ataxia or dysmetria.  Tremor was absent.  Gait and Station - walk with cane, left hemiparetic gait, no left arm swing        Assessment: Terry Juarez is a 52 year old female with right frontal lobe acute ICH with SAH on 12/19/2016 secondary to hypertension with increase in hemorrhage size and MRI evidence of PRES. she also presented with seizure activity at that time and was placed on Keppra.  Vascular risk factors include renal cell carcinoma, HTN, end-stage renal disease on PD, HLD and anemia.   Plan:  - continue baby ASA 81mg  for stroke prevention.  - continue keppra for seizure precaution.   - Please maintain seizure precautions.  - check BP at home and record - continue PD at home and follow up with nephrology for BP management - continue aggressive PT/OT to increase strength. Follow up with rehab Dr. Letta Pate. - renal  diet and self exercise - follow up in 6 months.   I spent more than 25 minutes of face to face time with the patient. Greater than 50% of time was spent in counseling and coordination of care. We discussed driving precautions, aggressive PT/OT, and follow up with nephrology.  Venancio Poisson, AGNP-BC  [facility] [address] [place], [place] [address]  Phone [phone] Fax [phone] Note: This document was prepared with digital dictation and possible smart phrase technology. Any transcriptional errors that result from this process are unintentional.

## 2018-07-06 DIAGNOSIS — D631 Anemia in chronic kidney disease: Secondary | ICD-10-CM | POA: Diagnosis not present

## 2018-07-06 DIAGNOSIS — N186 End stage renal disease: Secondary | ICD-10-CM | POA: Diagnosis not present

## 2018-07-06 DIAGNOSIS — D509 Iron deficiency anemia, unspecified: Secondary | ICD-10-CM | POA: Diagnosis not present

## 2018-07-06 DIAGNOSIS — R17 Unspecified jaundice: Secondary | ICD-10-CM | POA: Diagnosis not present

## 2018-07-06 DIAGNOSIS — E440 Moderate protein-calorie malnutrition: Secondary | ICD-10-CM | POA: Diagnosis not present

## 2018-07-06 DIAGNOSIS — Z5189 Encounter for other specified aftercare: Secondary | ICD-10-CM | POA: Diagnosis not present

## 2018-07-06 DIAGNOSIS — Z79899 Other long term (current) drug therapy: Secondary | ICD-10-CM | POA: Diagnosis not present

## 2018-07-06 DIAGNOSIS — E875 Hyperkalemia: Secondary | ICD-10-CM | POA: Diagnosis not present

## 2018-07-06 DIAGNOSIS — N2581 Secondary hyperparathyroidism of renal origin: Secondary | ICD-10-CM | POA: Diagnosis not present

## 2018-07-06 DIAGNOSIS — N2589 Other disorders resulting from impaired renal tubular function: Secondary | ICD-10-CM | POA: Diagnosis not present

## 2018-07-06 DIAGNOSIS — E8770 Fluid overload, unspecified: Secondary | ICD-10-CM | POA: Diagnosis not present

## 2018-07-07 DIAGNOSIS — Z79899 Other long term (current) drug therapy: Secondary | ICD-10-CM | POA: Diagnosis not present

## 2018-07-07 DIAGNOSIS — D509 Iron deficiency anemia, unspecified: Secondary | ICD-10-CM | POA: Diagnosis not present

## 2018-07-07 DIAGNOSIS — N2589 Other disorders resulting from impaired renal tubular function: Secondary | ICD-10-CM | POA: Diagnosis not present

## 2018-07-07 DIAGNOSIS — N186 End stage renal disease: Secondary | ICD-10-CM | POA: Diagnosis not present

## 2018-07-07 DIAGNOSIS — R17 Unspecified jaundice: Secondary | ICD-10-CM | POA: Diagnosis not present

## 2018-07-07 DIAGNOSIS — D631 Anemia in chronic kidney disease: Secondary | ICD-10-CM | POA: Diagnosis not present

## 2018-07-08 DIAGNOSIS — D509 Iron deficiency anemia, unspecified: Secondary | ICD-10-CM | POA: Diagnosis not present

## 2018-07-08 DIAGNOSIS — D631 Anemia in chronic kidney disease: Secondary | ICD-10-CM | POA: Diagnosis not present

## 2018-07-08 DIAGNOSIS — R17 Unspecified jaundice: Secondary | ICD-10-CM | POA: Diagnosis not present

## 2018-07-08 DIAGNOSIS — Z79899 Other long term (current) drug therapy: Secondary | ICD-10-CM | POA: Diagnosis not present

## 2018-07-08 DIAGNOSIS — N2589 Other disorders resulting from impaired renal tubular function: Secondary | ICD-10-CM | POA: Diagnosis not present

## 2018-07-08 DIAGNOSIS — N186 End stage renal disease: Secondary | ICD-10-CM | POA: Diagnosis not present

## 2018-07-09 DIAGNOSIS — R17 Unspecified jaundice: Secondary | ICD-10-CM | POA: Diagnosis not present

## 2018-07-09 DIAGNOSIS — Z79899 Other long term (current) drug therapy: Secondary | ICD-10-CM | POA: Diagnosis not present

## 2018-07-09 DIAGNOSIS — D509 Iron deficiency anemia, unspecified: Secondary | ICD-10-CM | POA: Diagnosis not present

## 2018-07-09 DIAGNOSIS — N2589 Other disorders resulting from impaired renal tubular function: Secondary | ICD-10-CM | POA: Diagnosis not present

## 2018-07-09 DIAGNOSIS — N186 End stage renal disease: Secondary | ICD-10-CM | POA: Diagnosis not present

## 2018-07-09 DIAGNOSIS — D631 Anemia in chronic kidney disease: Secondary | ICD-10-CM | POA: Diagnosis not present

## 2018-07-10 NOTE — Progress Notes (Signed)
I agree with the above plan 

## 2018-07-11 ENCOUNTER — Other Ambulatory Visit: Payer: Self-pay | Admitting: Gastroenterology

## 2018-07-11 DIAGNOSIS — Z79899 Other long term (current) drug therapy: Secondary | ICD-10-CM | POA: Diagnosis not present

## 2018-07-11 DIAGNOSIS — D509 Iron deficiency anemia, unspecified: Secondary | ICD-10-CM | POA: Diagnosis not present

## 2018-07-11 DIAGNOSIS — N186 End stage renal disease: Secondary | ICD-10-CM | POA: Diagnosis not present

## 2018-07-11 DIAGNOSIS — R17 Unspecified jaundice: Secondary | ICD-10-CM | POA: Diagnosis not present

## 2018-07-11 DIAGNOSIS — N2589 Other disorders resulting from impaired renal tubular function: Secondary | ICD-10-CM | POA: Diagnosis not present

## 2018-07-11 DIAGNOSIS — D631 Anemia in chronic kidney disease: Secondary | ICD-10-CM | POA: Diagnosis not present

## 2018-07-11 DIAGNOSIS — K921 Melena: Secondary | ICD-10-CM | POA: Diagnosis not present

## 2018-07-11 DIAGNOSIS — R5383 Other fatigue: Secondary | ICD-10-CM | POA: Diagnosis not present

## 2018-07-12 DIAGNOSIS — E890 Postprocedural hypothyroidism: Secondary | ICD-10-CM | POA: Diagnosis not present

## 2018-07-12 DIAGNOSIS — E7849 Other hyperlipidemia: Secondary | ICD-10-CM | POA: Diagnosis not present

## 2018-07-12 DIAGNOSIS — R17 Unspecified jaundice: Secondary | ICD-10-CM | POA: Diagnosis not present

## 2018-07-12 DIAGNOSIS — Z79899 Other long term (current) drug therapy: Secondary | ICD-10-CM | POA: Diagnosis not present

## 2018-07-12 DIAGNOSIS — N186 End stage renal disease: Secondary | ICD-10-CM | POA: Diagnosis not present

## 2018-07-12 DIAGNOSIS — D509 Iron deficiency anemia, unspecified: Secondary | ICD-10-CM | POA: Diagnosis not present

## 2018-07-12 DIAGNOSIS — N2589 Other disorders resulting from impaired renal tubular function: Secondary | ICD-10-CM | POA: Diagnosis not present

## 2018-07-12 DIAGNOSIS — E790 Hyperuricemia without signs of inflammatory arthritis and tophaceous disease: Secondary | ICD-10-CM | POA: Diagnosis not present

## 2018-07-12 DIAGNOSIS — D631 Anemia in chronic kidney disease: Secondary | ICD-10-CM | POA: Diagnosis not present

## 2018-07-13 DIAGNOSIS — Z79899 Other long term (current) drug therapy: Secondary | ICD-10-CM | POA: Diagnosis not present

## 2018-07-13 DIAGNOSIS — N2589 Other disorders resulting from impaired renal tubular function: Secondary | ICD-10-CM | POA: Diagnosis not present

## 2018-07-13 DIAGNOSIS — R17 Unspecified jaundice: Secondary | ICD-10-CM | POA: Diagnosis not present

## 2018-07-13 DIAGNOSIS — D509 Iron deficiency anemia, unspecified: Secondary | ICD-10-CM | POA: Diagnosis not present

## 2018-07-13 DIAGNOSIS — N186 End stage renal disease: Secondary | ICD-10-CM | POA: Diagnosis not present

## 2018-07-13 DIAGNOSIS — D631 Anemia in chronic kidney disease: Secondary | ICD-10-CM | POA: Diagnosis not present

## 2018-07-14 ENCOUNTER — Encounter (HOSPITAL_COMMUNITY): Payer: Self-pay | Admitting: *Deleted

## 2018-07-14 ENCOUNTER — Other Ambulatory Visit: Payer: Self-pay

## 2018-07-14 DIAGNOSIS — N2589 Other disorders resulting from impaired renal tubular function: Secondary | ICD-10-CM | POA: Diagnosis not present

## 2018-07-14 DIAGNOSIS — D631 Anemia in chronic kidney disease: Secondary | ICD-10-CM | POA: Diagnosis not present

## 2018-07-14 DIAGNOSIS — Z79899 Other long term (current) drug therapy: Secondary | ICD-10-CM | POA: Diagnosis not present

## 2018-07-14 DIAGNOSIS — R17 Unspecified jaundice: Secondary | ICD-10-CM | POA: Diagnosis not present

## 2018-07-14 DIAGNOSIS — D509 Iron deficiency anemia, unspecified: Secondary | ICD-10-CM | POA: Diagnosis not present

## 2018-07-14 DIAGNOSIS — N186 End stage renal disease: Secondary | ICD-10-CM | POA: Diagnosis not present

## 2018-07-14 NOTE — Anesthesia Preprocedure Evaluation (Addendum)
Anesthesia Evaluation  Patient identified by MRN, date of birth, ID band Patient awake    Reviewed: Allergy & Precautions, NPO status , Patient's Chart, lab work & pertinent test results, reviewed documented beta blocker date and time   History of Anesthesia Complications Negative for: history of anesthetic complications  Airway Mallampati: II  TM Distance: >3 FB Neck ROM: Full    Dental  (+) Dental Advisory Given   Pulmonary neg pulmonary ROS,    breath sounds clear to auscultation       Cardiovascular hypertension, Pt. on medications and Pt. on home beta blockers (-) angina Rhythm:Regular Rate:Normal  9/19 ECHO: EF 55-60%, mild MR   Neuro/Psych Seizures -, Well Controlled,  CVA (L hemiparesis), Residual Symptoms    GI/Hepatic Neg liver ROS, GERD  Controlled,  Endo/Other  Hypothyroidism   Renal/GU Dialysis and ESRFRenal disease     Musculoskeletal   Abdominal   Peds  Hematology negative hematology ROS (+)   Anesthesia Other Findings   Reproductive/Obstetrics Post-menopausal                            Anesthesia Physical Anesthesia Plan  ASA: III  Anesthesia Plan: MAC   Post-op Pain Management:    Induction:   PONV Risk Score and Plan: 2 and Treatment may vary due to age or medical condition  Airway Management Planned: Nasal Cannula and Natural Airway  Additional Equipment:   Intra-op Plan:   Post-operative Plan:   Informed Consent: I have reviewed the patients History and Physical, chart, labs and discussed the procedure including the risks, benefits and alternatives for the proposed anesthesia with the patient or authorized representative who has indicated his/her understanding and acceptance.   Dental advisory given  Plan Discussed with: CRNA and Surgeon  Anesthesia Plan Comments:         Anesthesia Quick Evaluation

## 2018-07-14 NOTE — Progress Notes (Signed)
Pt denies SOB, chest pain, and being under the care of a cardiologist. Pt denies having a stress test and cardiac cath. Pt nurse to [facility] STAY UNIT TO BE USED FOR DOS.  Pt stated that she was instructed to stop taking Aspirin " one week ago." Pt made aware to stop taking vitamins fish oil and herbal medications. Do not take any NSAIDs ie: Ibuprofen, Advil, Naproxen (Aleve), Motrin, BC and Goody Powder. Pt verbalized understanding of all pre-op instructions.

## 2018-07-15 ENCOUNTER — Encounter (HOSPITAL_COMMUNITY)
Admission: RE | Disposition: A | Discharge status: Home / Self Care | Payer: Self-pay | Source: Home / Self Care | Attending: Gastroenterology

## 2018-07-15 ENCOUNTER — Other Ambulatory Visit: Payer: Self-pay

## 2018-07-15 ENCOUNTER — Encounter (HOSPITAL_COMMUNITY): Payer: Self-pay | Admitting: *Deleted

## 2018-07-15 ENCOUNTER — Ambulatory Visit (HOSPITAL_COMMUNITY): Payer: Medicare Other | Admitting: Anesthesiology

## 2018-07-15 ENCOUNTER — Ambulatory Visit (HOSPITAL_COMMUNITY)
Admission: RE | Admit: 2018-07-15 | Discharge: 2018-07-15 | Disposition: A | Discharge status: Home / Self Care | Payer: Medicare Other | Attending: Gastroenterology | Admitting: Gastroenterology

## 2018-07-15 DIAGNOSIS — D509 Iron deficiency anemia, unspecified: Secondary | ICD-10-CM | POA: Diagnosis not present

## 2018-07-15 DIAGNOSIS — Z992 Dependence on renal dialysis: Secondary | ICD-10-CM | POA: Diagnosis not present

## 2018-07-15 DIAGNOSIS — N186 End stage renal disease: Secondary | ICD-10-CM | POA: Diagnosis not present

## 2018-07-15 DIAGNOSIS — Z8673 Personal history of transient ischemic attack (TIA), and cerebral infarction without residual deficits: Secondary | ICD-10-CM | POA: Insufficient documentation

## 2018-07-15 DIAGNOSIS — I120 Hypertensive chronic kidney disease with stage 5 chronic kidney disease or end stage renal disease: Secondary | ICD-10-CM | POA: Insufficient documentation

## 2018-07-15 DIAGNOSIS — Z85528 Personal history of other malignant neoplasm of kidney: Secondary | ICD-10-CM | POA: Insufficient documentation

## 2018-07-15 DIAGNOSIS — K31811 Angiodysplasia of stomach and duodenum with bleeding: Secondary | ICD-10-CM | POA: Diagnosis not present

## 2018-07-15 DIAGNOSIS — K7689 Other specified diseases of liver: Secondary | ICD-10-CM | POA: Insufficient documentation

## 2018-07-15 DIAGNOSIS — R195 Other fecal abnormalities: Secondary | ICD-10-CM | POA: Diagnosis not present

## 2018-07-15 DIAGNOSIS — D649 Anemia, unspecified: Secondary | ICD-10-CM | POA: Diagnosis present

## 2018-07-15 HISTORY — PX: ESOPHAGOGASTRODUODENOSCOPY (EGD) WITH PROPOFOL: SHX5813

## 2018-07-15 HISTORY — DX: Gastro-esophageal reflux disease without esophagitis: K21.9

## 2018-07-15 HISTORY — DX: Unspecified cataract: H26.9

## 2018-07-15 HISTORY — DX: Cardiac murmur, unspecified: R01.1

## 2018-07-15 HISTORY — DX: Unspecified convulsions: R56.9

## 2018-07-15 HISTORY — PX: HOT HEMOSTASIS: SHX5433

## 2018-07-15 HISTORY — DX: Presence of spectacles and contact lenses: Z97.3

## 2018-07-15 LAB — POCT I-STAT 4, (NA,K, GLUC, HGB,HCT)
Glucose, Bld: 96 mg/dL (ref 70–99)
HEMATOCRIT: 26 % — AB (ref 36.0–46.0)
HEMOGLOBIN: 8.8 g/dL — AB (ref 12.0–15.0)
Potassium: 4.3 mmol/L (ref 3.5–5.1)
Sodium: 141 mmol/L (ref 135–145)

## 2018-07-15 SURGERY — ESOPHAGOGASTRODUODENOSCOPY (EGD) WITH PROPOFOL
Anesthesia: Monitor Anesthesia Care

## 2018-07-15 MED ORDER — SODIUM CHLORIDE 0.9 % IV SOLN
INTRAVENOUS | Status: DC
  Administered 2018-07-15: 08:00:00 via INTRAVENOUS

## 2018-07-15 MED ORDER — PROPOFOL 500 MG/50ML IV EMUL
INTRAVENOUS | Status: DC | PRN
  Administered 2018-07-15: 75 ug/kg/min via INTRAVENOUS

## 2018-07-15 MED ORDER — LIDOCAINE HCL (CARDIAC) PF 100 MG/5ML IV SOSY
PREFILLED_SYRINGE | INTRAVENOUS | Status: DC | PRN
  Administered 2018-07-15: 100 mg via INTRAVENOUS

## 2018-07-15 MED ORDER — PROPOFOL 10 MG/ML IV BOLUS
INTRAVENOUS | Status: DC | PRN
  Administered 2018-07-15 (×2): 20 mg via INTRAVENOUS
  Administered 2018-07-15: 30 mg via INTRAVENOUS

## 2018-07-15 SURGICAL SUPPLY — 15 items

## 2018-07-15 NOTE — Transfer of Care (Signed)
Immediate Anesthesia Transfer of Care Note  Patient: Terry Juarez  Procedure(s) Performed: ESOPHAGOGASTRODUODENOSCOPY (EGD) WITH PROPOFOL (N/A ) HOT HEMOSTASIS (ARGON PLASMA COAGULATION/BICAP) (N/A )  Patient Location: Endoscopy Unit  Anesthesia Type:MAC  Level of Consciousness: awake, alert  and oriented  Airway & Oxygen Therapy: Patient Spontanous Breathing and Patient connected to nasal cannula oxygen  Post-op Assessment: Report given to RN and Post -op Vital signs reviewed and stable  Post vital signs: Reviewed and stable  Last Vitals:  Vitals Value Taken Time  BP 112/60 07/15/2018  8:00 AM  Temp    Pulse 63 07/15/2018  8:01 AM  Resp 12 07/15/2018  8:01 AM  SpO2 100 % 07/15/2018  8:01 AM  Vitals shown include unvalidated device data.  Last Pain:  Vitals:   07/15/18 0645  TempSrc: Oral  PainSc: 0-No pain         Complications: No apparent anesthesia complications

## 2018-07-15 NOTE — Progress Notes (Signed)
ISTAT obtained, results did not move over to chart. Results listed below.   ISTAT @ 0700  Na 141 K 4.3 Glu 96 Hct 26 Hb 8.8  MD and MDA made aware.

## 2018-07-15 NOTE — Op Note (Signed)
[facility] Patient Name: Terry Juarez Procedure Date : 07/15/2018 MRN: [id number] Attending MD: Carol Ada , MD Date of Birth: 01/05/1966 CSN: [id number] Age: 53 Admit Type: Outpatient Procedure:                Upper GI endoscopy Indications:              Iron deficiency anemia Providers:                Carol Ada, MD, Angus Seller, Janie Billups,                            Technician, Raphael Gibney, CRNA Referring MD:              Medicines:                Propofol per Anesthesia Complications:            No immediate complications. Estimated Blood Loss:     Estimated blood loss: none. Procedure:                Pre-Anesthesia Assessment:                           - Prior to the procedure, a History and Physical                            was performed, and patient medications and                            allergies were reviewed. The patient's tolerance of                            previous anesthesia was also reviewed. The risks                            and benefits of the procedure and the sedation                            options and risks were discussed with the patient.                            All questions were answered, and informed consent                            was obtained. Prior Anticoagulants: The patient has                            taken no previous anticoagulant or antiplatelet                            agents. ASA Grade Assessment: III - A patient with                            severe systemic disease. After reviewing the risks  and benefits, the patient was deemed in                            satisfactory condition to undergo the procedure.                           - Sedation was administered by an anesthesia                            professional. Deep sedation was attained.                           After obtaining informed consent, the endoscope was                            passed under  direct vision. Throughout the                            procedure, the patient's blood pressure, pulse, and                            oxygen saturations were monitored continuously. The                            GIF-H190 ([id number]) Olympus gastroscope was                            introduced through the mouth, and advanced to the                            second part of duodenum. The upper GI endoscopy was                            accomplished without difficulty. The patient                            tolerated the procedure well. Scope In: Scope Out: Findings:      The esophagus was normal.      A single small bleeding angiodysplastic lesion was found in the gastric       antrum. Coagulation for hemostasis using monopolar probe was successful.       Estimated blood loss: none.      The examined duodenum was normal.      In the antrum, near the pylorus, a bleeding site was identified. It did       not appear to be an overt AVM, but it was presumed to be this etiology       and it was ablated with APC. Impression:               - Normal esophagus.                           - A single bleeding angiodysplastic lesion in the  stomach. Treated with a monopolar probe.                           - Normal examined duodenum.                           - No specimens collected. Recommendation:           - Patient has a contact number available for                            emergencies. The signs and symptoms of potential                            delayed complications were discussed with the                            patient. Return to normal activities tomorrow.                            Written discharge instructions were provided to the                            patient.                           - Resume previous diet.                           - Continue present medications.                           - Return to GI clinic in 2-4 weeks. Procedure Code(s):         --- Professional ---                           [id number], Esophagogastroduodenoscopy, flexible,                            transoral; with control of bleeding, any method Diagnosis Code(s):        --- Professional ---                           X32.355, Angiodysplasia of stomach and duodenum                            with bleeding                           D50.9, Iron deficiency anemia, unspecified CPT copyright 2018 American Medical Association. All rights reserved. The codes documented in this report are preliminary and upon coder review may  be revised to meet current compliance requirements. Carol Ada, MD Carol Ada, MD 07/15/2018 7:58:56 AM This report has been signed electronically. Number of Addenda: 0

## 2018-07-15 NOTE — Discharge Instructions (Signed)
Upper Endoscopy, Adult, Care After °This sheet gives you information about how to care for yourself after your procedure. Your health care provider may also give you more specific instructions. If you have problems or questions, contact your health care provider. °What can I expect after the procedure? °After the procedure, it is common to have: °· A sore throat. °· Mild stomach pain or discomfort. °· Bloating. °· Nausea. °Follow these instructions at home: ° °· Follow instructions from your health care provider about what to eat or drink after your procedure. °· Return to your normal activities as told by your health care provider. Ask your health care provider what activities are safe for you. °· Take over-the-counter and prescription medicines only as told by your health care provider. °· Do not drive for 24 hours if you were given a sedative during your procedure. °· Keep all follow-up visits as told by your health care provider. This is important. °Contact a health care provider if you have: °· A sore throat that lasts longer than one day. °· Trouble swallowing. °Get help right away if: °· You vomit blood or your vomit looks like coffee grounds. °· You have: °? A fever. °? Bloody, black, or tarry stools. °? A severe sore throat or you cannot swallow. °? Difficulty breathing. °? Severe pain in your chest or abdomen. °Summary °· After the procedure, it is common to have a sore throat, mild stomach discomfort, bloating, and nausea. °· Do not drive for 24 hours if you were given a sedative during the procedure. °· Follow instructions from your health care provider about what to eat or drink after your procedure. °· Return to your normal activities as told by your health care provider. °This information is not intended to replace advice given to you by your health care provider. Make sure you discuss any questions you have with your health care provider. °Document Released: 12/22/2011 Document Revised: 11/22/2017  Document Reviewed: 11/22/2017 °Elsevier Interactive Patient Education © 2019 Elsevier Inc. ° °

## 2018-07-15 NOTE — Anesthesia Postprocedure Evaluation (Signed)
Anesthesia Post Note  Patient: Terry Juarez  Procedure(s) Performed: ESOPHAGOGASTRODUODENOSCOPY (EGD) WITH PROPOFOL (N/A ) HOT HEMOSTASIS (ARGON PLASMA COAGULATION/BICAP) (N/A )     Patient location during evaluation: Endoscopy Anesthesia Type: MAC Level of consciousness: awake and alert, patient cooperative and oriented Pain management: pain level controlled Vital Signs Assessment: post-procedure vital signs reviewed and stable Respiratory status: spontaneous breathing, nonlabored ventilation and respiratory function stable Cardiovascular status: blood pressure returned to baseline and stable Postop Assessment: no apparent nausea or vomiting Anesthetic complications: no    Last Vitals:  Vitals:   07/15/18 0800 07/15/18 0810  BP: 112/60 130/67  Pulse: 63 64  Resp: 14 14  Temp:    SpO2: 100% 100%    Last Pain:  Vitals:   07/15/18 0810  TempSrc:   PainSc: 0-No pain                 Yahya Boldman,E. Hannan Tetzlaff

## 2018-07-15 NOTE — H&P (Signed)
Terry Juarez HPI: The patient was noted to have a decline in her HGB again.  It was progressive and her most recent HGB was a 8.8 g/dL.  Her last EGD on 08/2017 was notable for a chronic inactive gastritis.  There was no evidence of any active bleeding.  Past Medical History:  Diagnosis Date  . Anemia   . Bruises easily   . Cancer [facility])    kidney cancer- right  . Dialysis patient ([place])   . Early cataracts, bilateral   . ESRD on dialysis ([place])    [place]- MWF, Sat- learning to do home dialysis  . GERD (gastroesophageal reflux disease)   . GI (gastrointestinal bleed)   . Heart murmur   . Hyperlipidemia   . Hypertension   . Hypothyroidism   . Pneumonia 2019  . Right renal mass   . Seizures ([place])   . Stroke ([place])   . Wears glasses     Past Surgical History:  Procedure Laterality Date  . AV FISTULA PLACEMENT Right 07/11/2014   Procedure: ARTERIOVENOUS (AV) FISTULA CREATION RIGHT ARM BRACHIO-CEPHALIC WITH ATTEMPTED RADIO-CEPHALIC (AV) FISTULA;  Surgeon: Mal Misty, MD;  Location: [place];  Service: Vascular;  Laterality: Right;  . CAPD REMOVAL N/A 06/01/2018   Procedure: REMOVAL OF PERITONEAL DIALYSIS CATHETER;  Surgeon: Clovis Riley, MD;  Location: [place];  Service: General;  Laterality: N/A;  . COLONOSCOPY WITH PROPOFOL N/A 09/04/2016   Procedure: COLONOSCOPY WITH PROPOFOL;  Surgeon: Carol Ada, MD;  Location: [facility];  Service: Endoscopy;  Laterality: N/A;  . ECTOPIC PREGNANCY SURGERY  1987  . ESOPHAGOGASTRODUODENOSCOPY N/A 07/30/2016   Procedure: ESOPHAGOGASTRODUODENOSCOPY (EGD);  Surgeon: Carol Ada, MD;  Location: [facility];  Service: Endoscopy;  Laterality: N/A;  Bedside  . ESOPHAGOGASTRODUODENOSCOPY (EGD) WITH PROPOFOL N/A 09/04/2016   Procedure: ESOPHAGOGASTRODUODENOSCOPY (EGD) WITH PROPOFOL;  Surgeon: Carol Ada, MD;  Location: [facility];  Service: Endoscopy;  Laterality: N/A;  . ESOPHAGOGASTRODUODENOSCOPY (EGD) WITH PROPOFOL N/A 08/27/2017   Procedure:  ESOPHAGOGASTRODUODENOSCOPY (EGD) WITH PROPOFOL;  Surgeon: Carol Ada, MD;  Location: [facility];  Service: Endoscopy;  Laterality: N/A;  . INSERTION OF DIALYSIS CATHETER Right 07/11/2014   Procedure: INSERTION OF DIALYSIS CATHETER IN RIGHT INTERNAL JUGULAR ;  Surgeon: Mal Misty, MD;  Location: [place];  Service: Vascular;  Laterality: Right;  . IR FLUORO GUIDE CV LINE LEFT  03/10/2018  . IR US GUIDE VASC ACCESS LEFT  03/10/2018  . LAPAROSCOPIC NEPHRECTOMY Right 07/25/2014   Procedure: RIGHT LAPAROSCOPIC RADICAL NEPHRECTOMY ;  Surgeon: Ardis Hughs, MD;  Location: [facility];  Service: Urology;  Laterality: Right;  . PATCH ANGIOPLASTY Right 07/11/2014   Procedure: PATCH ANGIOPLASTY OF RIGHT RADIAL ARTERY USING CEPHALIC VEIN.;  Surgeon: Mal Misty, MD;  Location: [place];  Service: Vascular;  Laterality: Right;  . THROMBECTOMY W/ EMBOLECTOMY Right 07/11/2014   Procedure: THROMBECTOMY OF RIGHT RADIAL ARTERY  ;  Surgeon: Mal Misty, MD;  Location: [facility] OR;  Service: Vascular;  Laterality: Right;    Family History  Problem Relation Age of Onset  . Throat cancer Mother        smoked  . Hypertension Father   . Renal Disease Father     Social History:  reports that she has never smoked. She has never used smokeless tobacco. She reports current alcohol use of about 2.0 standard drinks of alcohol per week. She reports that she does not use drugs.  Allergies:  Allergies  Allergen Reactions  . Lisinopril  Cough    Medications:  Scheduled:  Continuous: . sodium chloride      No results found for this or any previous visit (from the past 24 hour(s)).   No results found.  ROS:  As stated above in the HPI otherwise negative.  Blood pressure (!) 145/76, pulse 66, temperature 98.3 F (36.8 C), temperature source Oral, resp. rate 10, height 5\' 5"  (1.651 m), weight 56.7 kg, SpO2 99 %.    PE: Gen: NAD, Alert and Oriented HEENT:  [place]/AT, EOMI Neck: Supple, no LAD Lungs: CTA  Bilaterally CV: RRR without M/G/R ABM: Soft, NTND, +BS Ext: No C/C/E  Assessment/Plan: 1) Anemia - Repeat EGD.  Terry Juarez D 07/15/2018, 7:31 AM

## 2018-07-16 DIAGNOSIS — R17 Unspecified jaundice: Secondary | ICD-10-CM | POA: Diagnosis not present

## 2018-07-16 DIAGNOSIS — N2589 Other disorders resulting from impaired renal tubular function: Secondary | ICD-10-CM | POA: Diagnosis not present

## 2018-07-16 DIAGNOSIS — Z79899 Other long term (current) drug therapy: Secondary | ICD-10-CM | POA: Diagnosis not present

## 2018-07-16 DIAGNOSIS — D509 Iron deficiency anemia, unspecified: Secondary | ICD-10-CM | POA: Diagnosis not present

## 2018-07-16 DIAGNOSIS — N186 End stage renal disease: Secondary | ICD-10-CM | POA: Diagnosis not present

## 2018-07-16 DIAGNOSIS — D631 Anemia in chronic kidney disease: Secondary | ICD-10-CM | POA: Diagnosis not present

## 2018-07-17 ENCOUNTER — Encounter (HOSPITAL_COMMUNITY): Payer: Self-pay | Admitting: Gastroenterology

## 2018-07-18 DIAGNOSIS — D631 Anemia in chronic kidney disease: Secondary | ICD-10-CM | POA: Diagnosis not present

## 2018-07-18 DIAGNOSIS — N186 End stage renal disease: Secondary | ICD-10-CM | POA: Diagnosis not present

## 2018-07-18 DIAGNOSIS — R17 Unspecified jaundice: Secondary | ICD-10-CM | POA: Diagnosis not present

## 2018-07-18 DIAGNOSIS — D509 Iron deficiency anemia, unspecified: Secondary | ICD-10-CM | POA: Diagnosis not present

## 2018-07-18 DIAGNOSIS — N2589 Other disorders resulting from impaired renal tubular function: Secondary | ICD-10-CM | POA: Diagnosis not present

## 2018-07-18 DIAGNOSIS — Z79899 Other long term (current) drug therapy: Secondary | ICD-10-CM | POA: Diagnosis not present

## 2018-07-19 DIAGNOSIS — Z79899 Other long term (current) drug therapy: Secondary | ICD-10-CM | POA: Diagnosis not present

## 2018-07-19 DIAGNOSIS — N186 End stage renal disease: Secondary | ICD-10-CM | POA: Diagnosis not present

## 2018-07-19 DIAGNOSIS — D631 Anemia in chronic kidney disease: Secondary | ICD-10-CM | POA: Diagnosis not present

## 2018-07-19 DIAGNOSIS — N2589 Other disorders resulting from impaired renal tubular function: Secondary | ICD-10-CM | POA: Diagnosis not present

## 2018-07-19 DIAGNOSIS — D509 Iron deficiency anemia, unspecified: Secondary | ICD-10-CM | POA: Diagnosis not present

## 2018-07-19 DIAGNOSIS — R17 Unspecified jaundice: Secondary | ICD-10-CM | POA: Diagnosis not present

## 2018-07-21 DIAGNOSIS — N2589 Other disorders resulting from impaired renal tubular function: Secondary | ICD-10-CM | POA: Diagnosis not present

## 2018-07-21 DIAGNOSIS — D509 Iron deficiency anemia, unspecified: Secondary | ICD-10-CM | POA: Diagnosis not present

## 2018-07-21 DIAGNOSIS — N186 End stage renal disease: Secondary | ICD-10-CM | POA: Diagnosis not present

## 2018-07-21 DIAGNOSIS — Z79899 Other long term (current) drug therapy: Secondary | ICD-10-CM | POA: Diagnosis not present

## 2018-07-21 DIAGNOSIS — D631 Anemia in chronic kidney disease: Secondary | ICD-10-CM | POA: Diagnosis not present

## 2018-07-21 DIAGNOSIS — R17 Unspecified jaundice: Secondary | ICD-10-CM | POA: Diagnosis not present

## 2018-07-23 DIAGNOSIS — Z79899 Other long term (current) drug therapy: Secondary | ICD-10-CM | POA: Diagnosis not present

## 2018-07-23 DIAGNOSIS — N186 End stage renal disease: Secondary | ICD-10-CM | POA: Diagnosis not present

## 2018-07-23 DIAGNOSIS — D631 Anemia in chronic kidney disease: Secondary | ICD-10-CM | POA: Diagnosis not present

## 2018-07-23 DIAGNOSIS — N2589 Other disorders resulting from impaired renal tubular function: Secondary | ICD-10-CM | POA: Diagnosis not present

## 2018-07-23 DIAGNOSIS — D509 Iron deficiency anemia, unspecified: Secondary | ICD-10-CM | POA: Diagnosis not present

## 2018-07-23 DIAGNOSIS — R17 Unspecified jaundice: Secondary | ICD-10-CM | POA: Diagnosis not present

## 2018-07-25 DIAGNOSIS — Z79899 Other long term (current) drug therapy: Secondary | ICD-10-CM | POA: Diagnosis not present

## 2018-07-25 DIAGNOSIS — N186 End stage renal disease: Secondary | ICD-10-CM | POA: Diagnosis not present

## 2018-07-25 DIAGNOSIS — D509 Iron deficiency anemia, unspecified: Secondary | ICD-10-CM | POA: Diagnosis not present

## 2018-07-25 DIAGNOSIS — R17 Unspecified jaundice: Secondary | ICD-10-CM | POA: Diagnosis not present

## 2018-07-25 DIAGNOSIS — N2589 Other disorders resulting from impaired renal tubular function: Secondary | ICD-10-CM | POA: Diagnosis not present

## 2018-07-25 DIAGNOSIS — D631 Anemia in chronic kidney disease: Secondary | ICD-10-CM | POA: Diagnosis not present

## 2018-07-26 DIAGNOSIS — Z79899 Other long term (current) drug therapy: Secondary | ICD-10-CM | POA: Diagnosis not present

## 2018-07-26 DIAGNOSIS — N186 End stage renal disease: Secondary | ICD-10-CM | POA: Diagnosis not present

## 2018-07-26 DIAGNOSIS — D631 Anemia in chronic kidney disease: Secondary | ICD-10-CM | POA: Diagnosis not present

## 2018-07-26 DIAGNOSIS — N2589 Other disorders resulting from impaired renal tubular function: Secondary | ICD-10-CM | POA: Diagnosis not present

## 2018-07-26 DIAGNOSIS — D509 Iron deficiency anemia, unspecified: Secondary | ICD-10-CM | POA: Diagnosis not present

## 2018-07-26 DIAGNOSIS — R17 Unspecified jaundice: Secondary | ICD-10-CM | POA: Diagnosis not present

## 2018-07-29 DIAGNOSIS — N2589 Other disorders resulting from impaired renal tubular function: Secondary | ICD-10-CM | POA: Diagnosis not present

## 2018-07-29 DIAGNOSIS — Z79899 Other long term (current) drug therapy: Secondary | ICD-10-CM | POA: Diagnosis not present

## 2018-07-29 DIAGNOSIS — N186 End stage renal disease: Secondary | ICD-10-CM | POA: Diagnosis not present

## 2018-07-29 DIAGNOSIS — D509 Iron deficiency anemia, unspecified: Secondary | ICD-10-CM | POA: Diagnosis not present

## 2018-07-29 DIAGNOSIS — R17 Unspecified jaundice: Secondary | ICD-10-CM | POA: Diagnosis not present

## 2018-07-29 DIAGNOSIS — D631 Anemia in chronic kidney disease: Secondary | ICD-10-CM | POA: Diagnosis not present

## 2018-08-01 DIAGNOSIS — D631 Anemia in chronic kidney disease: Secondary | ICD-10-CM | POA: Diagnosis not present

## 2018-08-01 DIAGNOSIS — D509 Iron deficiency anemia, unspecified: Secondary | ICD-10-CM | POA: Diagnosis not present

## 2018-08-01 DIAGNOSIS — N2589 Other disorders resulting from impaired renal tubular function: Secondary | ICD-10-CM | POA: Diagnosis not present

## 2018-08-01 DIAGNOSIS — R17 Unspecified jaundice: Secondary | ICD-10-CM | POA: Diagnosis not present

## 2018-08-01 DIAGNOSIS — Z79899 Other long term (current) drug therapy: Secondary | ICD-10-CM | POA: Diagnosis not present

## 2018-08-01 DIAGNOSIS — N186 End stage renal disease: Secondary | ICD-10-CM | POA: Diagnosis not present

## 2018-08-02 DIAGNOSIS — R17 Unspecified jaundice: Secondary | ICD-10-CM | POA: Diagnosis not present

## 2018-08-02 DIAGNOSIS — N2589 Other disorders resulting from impaired renal tubular function: Secondary | ICD-10-CM | POA: Diagnosis not present

## 2018-08-02 DIAGNOSIS — D631 Anemia in chronic kidney disease: Secondary | ICD-10-CM | POA: Diagnosis not present

## 2018-08-02 DIAGNOSIS — N186 End stage renal disease: Secondary | ICD-10-CM | POA: Diagnosis not present

## 2018-08-02 DIAGNOSIS — D509 Iron deficiency anemia, unspecified: Secondary | ICD-10-CM | POA: Diagnosis not present

## 2018-08-02 DIAGNOSIS — Z79899 Other long term (current) drug therapy: Secondary | ICD-10-CM | POA: Diagnosis not present

## 2018-08-03 DIAGNOSIS — Z79899 Other long term (current) drug therapy: Secondary | ICD-10-CM | POA: Diagnosis not present

## 2018-08-03 DIAGNOSIS — R17 Unspecified jaundice: Secondary | ICD-10-CM | POA: Diagnosis not present

## 2018-08-03 DIAGNOSIS — D509 Iron deficiency anemia, unspecified: Secondary | ICD-10-CM | POA: Diagnosis not present

## 2018-08-03 DIAGNOSIS — N186 End stage renal disease: Secondary | ICD-10-CM | POA: Diagnosis not present

## 2018-08-03 DIAGNOSIS — N2589 Other disorders resulting from impaired renal tubular function: Secondary | ICD-10-CM | POA: Diagnosis not present

## 2018-08-03 DIAGNOSIS — D631 Anemia in chronic kidney disease: Secondary | ICD-10-CM | POA: Diagnosis not present

## 2018-08-04 ENCOUNTER — Ambulatory Visit (INDEPENDENT_AMBULATORY_CARE_PROVIDER_SITE_OTHER): Payer: BLUE CROSS/BLUE SHIELD | Admitting: Diagnostic Neuroimaging

## 2018-08-04 ENCOUNTER — Encounter (INDEPENDENT_AMBULATORY_CARE_PROVIDER_SITE_OTHER): Payer: BLUE CROSS/BLUE SHIELD | Admitting: Diagnostic Neuroimaging

## 2018-08-04 DIAGNOSIS — R202 Paresthesia of skin: Secondary | ICD-10-CM | POA: Diagnosis not present

## 2018-08-04 DIAGNOSIS — R200 Anesthesia of skin: Secondary | ICD-10-CM

## 2018-08-04 DIAGNOSIS — Z0289 Encounter for other administrative examinations: Secondary | ICD-10-CM

## 2018-08-05 DIAGNOSIS — D631 Anemia in chronic kidney disease: Secondary | ICD-10-CM | POA: Diagnosis not present

## 2018-08-05 DIAGNOSIS — N2589 Other disorders resulting from impaired renal tubular function: Secondary | ICD-10-CM | POA: Diagnosis not present

## 2018-08-05 DIAGNOSIS — N041 Nephrotic syndrome with focal and segmental glomerular lesions: Secondary | ICD-10-CM | POA: Diagnosis not present

## 2018-08-05 DIAGNOSIS — Z79899 Other long term (current) drug therapy: Secondary | ICD-10-CM | POA: Diagnosis not present

## 2018-08-05 DIAGNOSIS — N186 End stage renal disease: Secondary | ICD-10-CM | POA: Diagnosis not present

## 2018-08-05 DIAGNOSIS — D509 Iron deficiency anemia, unspecified: Secondary | ICD-10-CM | POA: Diagnosis not present

## 2018-08-05 DIAGNOSIS — Z992 Dependence on renal dialysis: Secondary | ICD-10-CM | POA: Diagnosis not present

## 2018-08-05 DIAGNOSIS — R17 Unspecified jaundice: Secondary | ICD-10-CM | POA: Diagnosis not present

## 2018-08-06 DIAGNOSIS — E440 Moderate protein-calorie malnutrition: Secondary | ICD-10-CM | POA: Diagnosis not present

## 2018-08-06 DIAGNOSIS — Z23 Encounter for immunization: Secondary | ICD-10-CM | POA: Diagnosis not present

## 2018-08-06 DIAGNOSIS — N041 Nephrotic syndrome with focal and segmental glomerular lesions: Secondary | ICD-10-CM | POA: Diagnosis not present

## 2018-08-06 DIAGNOSIS — E8770 Fluid overload, unspecified: Secondary | ICD-10-CM | POA: Diagnosis not present

## 2018-08-06 DIAGNOSIS — N2581 Secondary hyperparathyroidism of renal origin: Secondary | ICD-10-CM | POA: Diagnosis not present

## 2018-08-06 DIAGNOSIS — E875 Hyperkalemia: Secondary | ICD-10-CM | POA: Diagnosis not present

## 2018-08-06 DIAGNOSIS — Z992 Dependence on renal dialysis: Secondary | ICD-10-CM | POA: Diagnosis not present

## 2018-08-06 DIAGNOSIS — N186 End stage renal disease: Secondary | ICD-10-CM | POA: Diagnosis not present

## 2018-08-06 DIAGNOSIS — Z4931 Encounter for adequacy testing for hemodialysis: Secondary | ICD-10-CM | POA: Diagnosis not present

## 2018-08-06 DIAGNOSIS — Z79899 Other long term (current) drug therapy: Secondary | ICD-10-CM | POA: Diagnosis not present

## 2018-08-06 DIAGNOSIS — D631 Anemia in chronic kidney disease: Secondary | ICD-10-CM | POA: Diagnosis not present

## 2018-08-06 DIAGNOSIS — R17 Unspecified jaundice: Secondary | ICD-10-CM | POA: Diagnosis not present

## 2018-08-06 DIAGNOSIS — Z5189 Encounter for other specified aftercare: Secondary | ICD-10-CM | POA: Diagnosis not present

## 2018-08-06 DIAGNOSIS — D509 Iron deficiency anemia, unspecified: Secondary | ICD-10-CM | POA: Diagnosis not present

## 2018-08-08 DIAGNOSIS — Z79899 Other long term (current) drug therapy: Secondary | ICD-10-CM | POA: Diagnosis not present

## 2018-08-08 DIAGNOSIS — N186 End stage renal disease: Secondary | ICD-10-CM | POA: Diagnosis not present

## 2018-08-08 DIAGNOSIS — E875 Hyperkalemia: Secondary | ICD-10-CM | POA: Diagnosis not present

## 2018-08-08 DIAGNOSIS — N2581 Secondary hyperparathyroidism of renal origin: Secondary | ICD-10-CM | POA: Diagnosis not present

## 2018-08-08 DIAGNOSIS — D631 Anemia in chronic kidney disease: Secondary | ICD-10-CM | POA: Diagnosis not present

## 2018-08-08 DIAGNOSIS — D509 Iron deficiency anemia, unspecified: Secondary | ICD-10-CM | POA: Diagnosis not present

## 2018-08-08 NOTE — Procedures (Signed)
   [facility]  NCS (NERVE CONDUCTION STUDY) WITH EMG (ELECTROMYOGRAPHY) REPORT   STUDY DATE: 08/04/18 PATIENT NAME: Terry Juarez DOB: July 01, 1966 MRN: [id number]  ORDERING CLINICIAN: Venancio Poisson, NP   TECHNOLOGIST: Sherre Scarlet ELECTROMYOGRAPHER: Earlean Polka. Queena Monrreal, MD  CLINICAL INFORMATION: 53 year old female here for evaluation of right hand numbness.  Right arm fistula for dialysis access noted.   FINDINGS: NERVE CONDUCTION STUDY:  Right median motor response is prolonged distal latency (4.5 ms), normal amplitude.  Proximal median nerve stimulation was technically limited due to right arm fistula.  Right ulnar motor responses normal.  Right median sensory response has prolonged peak latency and decreased amplitude.  Right ulnar sensory responses normal.   NEEDLE ELECTROMYOGRAPHY:  Needle examination of right upper extremity deltoid, flexor carpi radialis, first dorsal interosseous is normal.  Other muscles were deferred due to location of right arm fistula.   IMPRESSION:   Abnormal study demonstrating: - Mild right median neuropathy at the wrist consistent with mild carpal tunnel syndrome.     INTERPRETING PHYSICIAN:  Penni Bombard, MD Certified in Neurology, Neurophysiology and Neuroimaging  [facility] [address] [place], [place] [address] [phone]   [facility]    Nerve / Sites Muscle Latency Ref. Amplitude Ref. Rel Amp Segments Distance Velocity Ref. Area    ms ms mV mV %  cm m/s m/s mVms  R Median - APB     Wrist APB 4.5 ?4.4 8.6 ?4.0 100 Wrist - APB 7   23.5  R Ulnar - ADM     Wrist ADM 3.0 ?3.3 8.7 ?6.0 100 Wrist - ADM 7   22.5     B.Elbow ADM 6.7  8.0  92.4 B.Elbow - Wrist 18 49 ?49 22.1     A.Elbow ADM 8.7  7.8  97.3 A.Elbow - B.Elbow 10 49 ?49 21.8         A.Elbow - Wrist             SNC    Nerve / Sites Rec. Site Peak Lat Ref.  Amp Ref. Segments Distance    ms ms V V  cm  R  Median - Orthodromic (Dig II, Mid palm)     Dig II Wrist 4.5 ?3.4 6 ?10 Dig II - Wrist 13  R Ulnar - Orthodromic, (Dig V, Mid palm)     Dig V Wrist 3.1 ?3.1 12 ?5 Dig V - Wrist 15         F  Wave    Nerve F Lat Ref.   ms ms  R Ulnar - ADM 26.0 ?32.0       EMG full       EMG Summary Table    Spontaneous MUAP Recruitment  Muscle IA Fib PSW Fasc Other Amp Dur. Poly Pattern  R. Deltoid Normal None None None _______ Normal Normal Normal Normal  R. Flexor carpi radialis Normal None None None _______ Normal Normal Normal Normal  R. First dorsal interosseous Normal None None None _______ Normal Normal Normal Normal

## 2018-08-09 DIAGNOSIS — E875 Hyperkalemia: Secondary | ICD-10-CM | POA: Diagnosis not present

## 2018-08-09 DIAGNOSIS — E890 Postprocedural hypothyroidism: Secondary | ICD-10-CM | POA: Diagnosis not present

## 2018-08-09 DIAGNOSIS — N186 End stage renal disease: Secondary | ICD-10-CM | POA: Diagnosis not present

## 2018-08-09 DIAGNOSIS — N2581 Secondary hyperparathyroidism of renal origin: Secondary | ICD-10-CM | POA: Diagnosis not present

## 2018-08-09 DIAGNOSIS — D631 Anemia in chronic kidney disease: Secondary | ICD-10-CM | POA: Diagnosis not present

## 2018-08-09 DIAGNOSIS — Z79899 Other long term (current) drug therapy: Secondary | ICD-10-CM | POA: Diagnosis not present

## 2018-08-09 DIAGNOSIS — D509 Iron deficiency anemia, unspecified: Secondary | ICD-10-CM | POA: Diagnosis not present

## 2018-08-10 ENCOUNTER — Telehealth: Payer: Self-pay | Admitting: *Deleted

## 2018-08-10 DIAGNOSIS — D631 Anemia in chronic kidney disease: Secondary | ICD-10-CM | POA: Diagnosis not present

## 2018-08-10 DIAGNOSIS — E875 Hyperkalemia: Secondary | ICD-10-CM | POA: Diagnosis not present

## 2018-08-10 DIAGNOSIS — D509 Iron deficiency anemia, unspecified: Secondary | ICD-10-CM | POA: Diagnosis not present

## 2018-08-10 DIAGNOSIS — N186 End stage renal disease: Secondary | ICD-10-CM | POA: Diagnosis not present

## 2018-08-10 DIAGNOSIS — N2581 Secondary hyperparathyroidism of renal origin: Secondary | ICD-10-CM | POA: Diagnosis not present

## 2018-08-10 DIAGNOSIS — Z79899 Other long term (current) drug therapy: Secondary | ICD-10-CM | POA: Diagnosis not present

## 2018-08-10 NOTE — Telephone Encounter (Signed)
Spoke with Terry Juarez and reviewed below EMG/NCV results, with rec. to wear right wrist splint for immobilization at night and with any activity where she would move her wrist/hand more. She verbalized understanding of same/fim

## 2018-08-10 NOTE — Telephone Encounter (Signed)
-----   Message from Venancio Poisson, NP sent at 08/09/2018  6:37 AM EST ----- Please advise her that her recent EMG/NCV showed mild carpal tunnel in her right wrist.  It is recommended for her to try use of carpal tunnel brace wearing this at night or with any activity where she may use her right hand more such as writing or cooking.

## 2018-08-12 DIAGNOSIS — N186 End stage renal disease: Secondary | ICD-10-CM | POA: Diagnosis not present

## 2018-08-12 DIAGNOSIS — D631 Anemia in chronic kidney disease: Secondary | ICD-10-CM | POA: Diagnosis not present

## 2018-08-12 DIAGNOSIS — D509 Iron deficiency anemia, unspecified: Secondary | ICD-10-CM | POA: Diagnosis not present

## 2018-08-12 DIAGNOSIS — N2581 Secondary hyperparathyroidism of renal origin: Secondary | ICD-10-CM | POA: Diagnosis not present

## 2018-08-12 DIAGNOSIS — E875 Hyperkalemia: Secondary | ICD-10-CM | POA: Diagnosis not present

## 2018-08-12 DIAGNOSIS — Z79899 Other long term (current) drug therapy: Secondary | ICD-10-CM | POA: Diagnosis not present

## 2018-08-13 DIAGNOSIS — D631 Anemia in chronic kidney disease: Secondary | ICD-10-CM | POA: Diagnosis not present

## 2018-08-13 DIAGNOSIS — Z79899 Other long term (current) drug therapy: Secondary | ICD-10-CM | POA: Diagnosis not present

## 2018-08-13 DIAGNOSIS — N2581 Secondary hyperparathyroidism of renal origin: Secondary | ICD-10-CM | POA: Diagnosis not present

## 2018-08-13 DIAGNOSIS — D509 Iron deficiency anemia, unspecified: Secondary | ICD-10-CM | POA: Diagnosis not present

## 2018-08-13 DIAGNOSIS — E875 Hyperkalemia: Secondary | ICD-10-CM | POA: Diagnosis not present

## 2018-08-13 DIAGNOSIS — N186 End stage renal disease: Secondary | ICD-10-CM | POA: Diagnosis not present

## 2018-08-15 DIAGNOSIS — D509 Iron deficiency anemia, unspecified: Secondary | ICD-10-CM | POA: Diagnosis not present

## 2018-08-15 DIAGNOSIS — N2581 Secondary hyperparathyroidism of renal origin: Secondary | ICD-10-CM | POA: Diagnosis not present

## 2018-08-15 DIAGNOSIS — E875 Hyperkalemia: Secondary | ICD-10-CM | POA: Diagnosis not present

## 2018-08-15 DIAGNOSIS — N186 End stage renal disease: Secondary | ICD-10-CM | POA: Diagnosis not present

## 2018-08-15 DIAGNOSIS — Z79899 Other long term (current) drug therapy: Secondary | ICD-10-CM | POA: Diagnosis not present

## 2018-08-15 DIAGNOSIS — D631 Anemia in chronic kidney disease: Secondary | ICD-10-CM | POA: Diagnosis not present

## 2018-08-17 DIAGNOSIS — Z79899 Other long term (current) drug therapy: Secondary | ICD-10-CM | POA: Diagnosis not present

## 2018-08-17 DIAGNOSIS — E875 Hyperkalemia: Secondary | ICD-10-CM | POA: Diagnosis not present

## 2018-08-17 DIAGNOSIS — D509 Iron deficiency anemia, unspecified: Secondary | ICD-10-CM | POA: Diagnosis not present

## 2018-08-17 DIAGNOSIS — D631 Anemia in chronic kidney disease: Secondary | ICD-10-CM | POA: Diagnosis not present

## 2018-08-17 DIAGNOSIS — N186 End stage renal disease: Secondary | ICD-10-CM | POA: Diagnosis not present

## 2018-08-17 DIAGNOSIS — N2581 Secondary hyperparathyroidism of renal origin: Secondary | ICD-10-CM | POA: Diagnosis not present

## 2018-08-19 DIAGNOSIS — N2581 Secondary hyperparathyroidism of renal origin: Secondary | ICD-10-CM | POA: Diagnosis not present

## 2018-08-19 DIAGNOSIS — E875 Hyperkalemia: Secondary | ICD-10-CM | POA: Diagnosis not present

## 2018-08-19 DIAGNOSIS — Z79899 Other long term (current) drug therapy: Secondary | ICD-10-CM | POA: Diagnosis not present

## 2018-08-19 DIAGNOSIS — N186 End stage renal disease: Secondary | ICD-10-CM | POA: Diagnosis not present

## 2018-08-19 DIAGNOSIS — D631 Anemia in chronic kidney disease: Secondary | ICD-10-CM | POA: Diagnosis not present

## 2018-08-19 DIAGNOSIS — D509 Iron deficiency anemia, unspecified: Secondary | ICD-10-CM | POA: Diagnosis not present

## 2018-08-20 DIAGNOSIS — Z79899 Other long term (current) drug therapy: Secondary | ICD-10-CM | POA: Diagnosis not present

## 2018-08-20 DIAGNOSIS — D509 Iron deficiency anemia, unspecified: Secondary | ICD-10-CM | POA: Diagnosis not present

## 2018-08-20 DIAGNOSIS — E875 Hyperkalemia: Secondary | ICD-10-CM | POA: Diagnosis not present

## 2018-08-20 DIAGNOSIS — N2581 Secondary hyperparathyroidism of renal origin: Secondary | ICD-10-CM | POA: Diagnosis not present

## 2018-08-20 DIAGNOSIS — N186 End stage renal disease: Secondary | ICD-10-CM | POA: Diagnosis not present

## 2018-08-20 DIAGNOSIS — D631 Anemia in chronic kidney disease: Secondary | ICD-10-CM | POA: Diagnosis not present

## 2018-08-22 DIAGNOSIS — D509 Iron deficiency anemia, unspecified: Secondary | ICD-10-CM | POA: Diagnosis not present

## 2018-08-22 DIAGNOSIS — E875 Hyperkalemia: Secondary | ICD-10-CM | POA: Diagnosis not present

## 2018-08-22 DIAGNOSIS — Z79899 Other long term (current) drug therapy: Secondary | ICD-10-CM | POA: Diagnosis not present

## 2018-08-22 DIAGNOSIS — N2581 Secondary hyperparathyroidism of renal origin: Secondary | ICD-10-CM | POA: Diagnosis not present

## 2018-08-22 DIAGNOSIS — D631 Anemia in chronic kidney disease: Secondary | ICD-10-CM | POA: Diagnosis not present

## 2018-08-22 DIAGNOSIS — N186 End stage renal disease: Secondary | ICD-10-CM | POA: Diagnosis not present

## 2018-08-24 DIAGNOSIS — N2581 Secondary hyperparathyroidism of renal origin: Secondary | ICD-10-CM | POA: Diagnosis not present

## 2018-08-24 DIAGNOSIS — D509 Iron deficiency anemia, unspecified: Secondary | ICD-10-CM | POA: Diagnosis not present

## 2018-08-24 DIAGNOSIS — N186 End stage renal disease: Secondary | ICD-10-CM | POA: Diagnosis not present

## 2018-08-24 DIAGNOSIS — Z79899 Other long term (current) drug therapy: Secondary | ICD-10-CM | POA: Diagnosis not present

## 2018-08-24 DIAGNOSIS — D631 Anemia in chronic kidney disease: Secondary | ICD-10-CM | POA: Diagnosis not present

## 2018-08-24 DIAGNOSIS — E875 Hyperkalemia: Secondary | ICD-10-CM | POA: Diagnosis not present

## 2018-08-26 DIAGNOSIS — Z79899 Other long term (current) drug therapy: Secondary | ICD-10-CM | POA: Diagnosis not present

## 2018-08-26 DIAGNOSIS — N2581 Secondary hyperparathyroidism of renal origin: Secondary | ICD-10-CM | POA: Diagnosis not present

## 2018-08-26 DIAGNOSIS — N186 End stage renal disease: Secondary | ICD-10-CM | POA: Diagnosis not present

## 2018-08-26 DIAGNOSIS — E875 Hyperkalemia: Secondary | ICD-10-CM | POA: Diagnosis not present

## 2018-08-26 DIAGNOSIS — D509 Iron deficiency anemia, unspecified: Secondary | ICD-10-CM | POA: Diagnosis not present

## 2018-08-26 DIAGNOSIS — D631 Anemia in chronic kidney disease: Secondary | ICD-10-CM | POA: Diagnosis not present

## 2018-08-27 DIAGNOSIS — E875 Hyperkalemia: Secondary | ICD-10-CM | POA: Diagnosis not present

## 2018-08-27 DIAGNOSIS — D509 Iron deficiency anemia, unspecified: Secondary | ICD-10-CM | POA: Diagnosis not present

## 2018-08-27 DIAGNOSIS — D631 Anemia in chronic kidney disease: Secondary | ICD-10-CM | POA: Diagnosis not present

## 2018-08-27 DIAGNOSIS — N2581 Secondary hyperparathyroidism of renal origin: Secondary | ICD-10-CM | POA: Diagnosis not present

## 2018-08-27 DIAGNOSIS — N186 End stage renal disease: Secondary | ICD-10-CM | POA: Diagnosis not present

## 2018-08-27 DIAGNOSIS — Z79899 Other long term (current) drug therapy: Secondary | ICD-10-CM | POA: Diagnosis not present

## 2018-08-28 DIAGNOSIS — Z79899 Other long term (current) drug therapy: Secondary | ICD-10-CM | POA: Diagnosis not present

## 2018-08-28 DIAGNOSIS — N186 End stage renal disease: Secondary | ICD-10-CM | POA: Diagnosis not present

## 2018-08-28 DIAGNOSIS — N2581 Secondary hyperparathyroidism of renal origin: Secondary | ICD-10-CM | POA: Diagnosis not present

## 2018-08-28 DIAGNOSIS — D509 Iron deficiency anemia, unspecified: Secondary | ICD-10-CM | POA: Diagnosis not present

## 2018-08-28 DIAGNOSIS — E875 Hyperkalemia: Secondary | ICD-10-CM | POA: Diagnosis not present

## 2018-08-28 DIAGNOSIS — D631 Anemia in chronic kidney disease: Secondary | ICD-10-CM | POA: Diagnosis not present

## 2018-08-29 DIAGNOSIS — N186 End stage renal disease: Secondary | ICD-10-CM | POA: Diagnosis not present

## 2018-08-29 DIAGNOSIS — E875 Hyperkalemia: Secondary | ICD-10-CM | POA: Diagnosis not present

## 2018-08-29 DIAGNOSIS — D631 Anemia in chronic kidney disease: Secondary | ICD-10-CM | POA: Diagnosis not present

## 2018-08-29 DIAGNOSIS — Z79899 Other long term (current) drug therapy: Secondary | ICD-10-CM | POA: Diagnosis not present

## 2018-08-29 DIAGNOSIS — N2581 Secondary hyperparathyroidism of renal origin: Secondary | ICD-10-CM | POA: Diagnosis not present

## 2018-08-29 DIAGNOSIS — D509 Iron deficiency anemia, unspecified: Secondary | ICD-10-CM | POA: Diagnosis not present

## 2018-08-31 DIAGNOSIS — Z79899 Other long term (current) drug therapy: Secondary | ICD-10-CM | POA: Diagnosis not present

## 2018-08-31 DIAGNOSIS — E875 Hyperkalemia: Secondary | ICD-10-CM | POA: Diagnosis not present

## 2018-08-31 DIAGNOSIS — N186 End stage renal disease: Secondary | ICD-10-CM | POA: Diagnosis not present

## 2018-08-31 DIAGNOSIS — N2581 Secondary hyperparathyroidism of renal origin: Secondary | ICD-10-CM | POA: Diagnosis not present

## 2018-08-31 DIAGNOSIS — D631 Anemia in chronic kidney disease: Secondary | ICD-10-CM | POA: Diagnosis not present

## 2018-08-31 DIAGNOSIS — D509 Iron deficiency anemia, unspecified: Secondary | ICD-10-CM | POA: Diagnosis not present

## 2018-09-01 DIAGNOSIS — N186 End stage renal disease: Secondary | ICD-10-CM | POA: Diagnosis not present

## 2018-09-01 DIAGNOSIS — D509 Iron deficiency anemia, unspecified: Secondary | ICD-10-CM | POA: Diagnosis not present

## 2018-09-01 DIAGNOSIS — N2581 Secondary hyperparathyroidism of renal origin: Secondary | ICD-10-CM | POA: Diagnosis not present

## 2018-09-01 DIAGNOSIS — Z79899 Other long term (current) drug therapy: Secondary | ICD-10-CM | POA: Diagnosis not present

## 2018-09-01 DIAGNOSIS — D631 Anemia in chronic kidney disease: Secondary | ICD-10-CM | POA: Diagnosis not present

## 2018-09-01 DIAGNOSIS — E875 Hyperkalemia: Secondary | ICD-10-CM | POA: Diagnosis not present

## 2018-09-02 DIAGNOSIS — E875 Hyperkalemia: Secondary | ICD-10-CM | POA: Diagnosis not present

## 2018-09-02 DIAGNOSIS — D631 Anemia in chronic kidney disease: Secondary | ICD-10-CM | POA: Diagnosis not present

## 2018-09-02 DIAGNOSIS — N2581 Secondary hyperparathyroidism of renal origin: Secondary | ICD-10-CM | POA: Diagnosis not present

## 2018-09-02 DIAGNOSIS — D509 Iron deficiency anemia, unspecified: Secondary | ICD-10-CM | POA: Diagnosis not present

## 2018-09-02 DIAGNOSIS — N186 End stage renal disease: Secondary | ICD-10-CM | POA: Diagnosis not present

## 2018-09-02 DIAGNOSIS — Z79899 Other long term (current) drug therapy: Secondary | ICD-10-CM | POA: Diagnosis not present

## 2018-09-03 DIAGNOSIS — N186 End stage renal disease: Secondary | ICD-10-CM | POA: Diagnosis not present

## 2018-09-03 DIAGNOSIS — N2581 Secondary hyperparathyroidism of renal origin: Secondary | ICD-10-CM | POA: Diagnosis not present

## 2018-09-03 DIAGNOSIS — D631 Anemia in chronic kidney disease: Secondary | ICD-10-CM | POA: Diagnosis not present

## 2018-09-03 DIAGNOSIS — E875 Hyperkalemia: Secondary | ICD-10-CM | POA: Diagnosis not present

## 2018-09-03 DIAGNOSIS — D509 Iron deficiency anemia, unspecified: Secondary | ICD-10-CM | POA: Diagnosis not present

## 2018-09-03 DIAGNOSIS — Z79899 Other long term (current) drug therapy: Secondary | ICD-10-CM | POA: Diagnosis not present

## 2018-09-04 DIAGNOSIS — E875 Hyperkalemia: Secondary | ICD-10-CM | POA: Diagnosis not present

## 2018-09-04 DIAGNOSIS — N2581 Secondary hyperparathyroidism of renal origin: Secondary | ICD-10-CM | POA: Diagnosis not present

## 2018-09-04 DIAGNOSIS — E440 Moderate protein-calorie malnutrition: Secondary | ICD-10-CM | POA: Diagnosis not present

## 2018-09-04 DIAGNOSIS — Z79899 Other long term (current) drug therapy: Secondary | ICD-10-CM | POA: Diagnosis not present

## 2018-09-04 DIAGNOSIS — D631 Anemia in chronic kidney disease: Secondary | ICD-10-CM | POA: Diagnosis not present

## 2018-09-04 DIAGNOSIS — D509 Iron deficiency anemia, unspecified: Secondary | ICD-10-CM | POA: Diagnosis not present

## 2018-09-04 DIAGNOSIS — Z5189 Encounter for other specified aftercare: Secondary | ICD-10-CM | POA: Diagnosis not present

## 2018-09-04 DIAGNOSIS — R17 Unspecified jaundice: Secondary | ICD-10-CM | POA: Diagnosis not present

## 2018-09-04 DIAGNOSIS — E8770 Fluid overload, unspecified: Secondary | ICD-10-CM | POA: Diagnosis not present

## 2018-09-04 DIAGNOSIS — N186 End stage renal disease: Secondary | ICD-10-CM | POA: Diagnosis not present

## 2018-09-05 DIAGNOSIS — E875 Hyperkalemia: Secondary | ICD-10-CM | POA: Diagnosis not present

## 2018-09-05 DIAGNOSIS — D631 Anemia in chronic kidney disease: Secondary | ICD-10-CM | POA: Diagnosis not present

## 2018-09-05 DIAGNOSIS — D509 Iron deficiency anemia, unspecified: Secondary | ICD-10-CM | POA: Diagnosis not present

## 2018-09-05 DIAGNOSIS — N186 End stage renal disease: Secondary | ICD-10-CM | POA: Diagnosis not present

## 2018-09-05 DIAGNOSIS — N2581 Secondary hyperparathyroidism of renal origin: Secondary | ICD-10-CM | POA: Diagnosis not present

## 2018-09-05 DIAGNOSIS — E440 Moderate protein-calorie malnutrition: Secondary | ICD-10-CM | POA: Diagnosis not present

## 2018-09-07 DIAGNOSIS — N2581 Secondary hyperparathyroidism of renal origin: Secondary | ICD-10-CM | POA: Diagnosis not present

## 2018-09-07 DIAGNOSIS — E875 Hyperkalemia: Secondary | ICD-10-CM | POA: Diagnosis not present

## 2018-09-07 DIAGNOSIS — E440 Moderate protein-calorie malnutrition: Secondary | ICD-10-CM | POA: Diagnosis not present

## 2018-09-07 DIAGNOSIS — N186 End stage renal disease: Secondary | ICD-10-CM | POA: Diagnosis not present

## 2018-09-07 DIAGNOSIS — D509 Iron deficiency anemia, unspecified: Secondary | ICD-10-CM | POA: Diagnosis not present

## 2018-09-07 DIAGNOSIS — D631 Anemia in chronic kidney disease: Secondary | ICD-10-CM | POA: Diagnosis not present

## 2018-09-09 DIAGNOSIS — D631 Anemia in chronic kidney disease: Secondary | ICD-10-CM | POA: Diagnosis not present

## 2018-09-09 DIAGNOSIS — E440 Moderate protein-calorie malnutrition: Secondary | ICD-10-CM | POA: Diagnosis not present

## 2018-09-09 DIAGNOSIS — N2581 Secondary hyperparathyroidism of renal origin: Secondary | ICD-10-CM | POA: Diagnosis not present

## 2018-09-09 DIAGNOSIS — N186 End stage renal disease: Secondary | ICD-10-CM | POA: Diagnosis not present

## 2018-09-09 DIAGNOSIS — E875 Hyperkalemia: Secondary | ICD-10-CM | POA: Diagnosis not present

## 2018-09-09 DIAGNOSIS — D509 Iron deficiency anemia, unspecified: Secondary | ICD-10-CM | POA: Diagnosis not present

## 2018-09-10 DIAGNOSIS — E440 Moderate protein-calorie malnutrition: Secondary | ICD-10-CM | POA: Diagnosis not present

## 2018-09-10 DIAGNOSIS — N186 End stage renal disease: Secondary | ICD-10-CM | POA: Diagnosis not present

## 2018-09-10 DIAGNOSIS — D509 Iron deficiency anemia, unspecified: Secondary | ICD-10-CM | POA: Diagnosis not present

## 2018-09-10 DIAGNOSIS — E875 Hyperkalemia: Secondary | ICD-10-CM | POA: Diagnosis not present

## 2018-09-10 DIAGNOSIS — D631 Anemia in chronic kidney disease: Secondary | ICD-10-CM | POA: Diagnosis not present

## 2018-09-10 DIAGNOSIS — N2581 Secondary hyperparathyroidism of renal origin: Secondary | ICD-10-CM | POA: Diagnosis not present

## 2018-09-12 DIAGNOSIS — D631 Anemia in chronic kidney disease: Secondary | ICD-10-CM | POA: Diagnosis not present

## 2018-09-12 DIAGNOSIS — N186 End stage renal disease: Secondary | ICD-10-CM | POA: Diagnosis not present

## 2018-09-12 DIAGNOSIS — E440 Moderate protein-calorie malnutrition: Secondary | ICD-10-CM | POA: Diagnosis not present

## 2018-09-12 DIAGNOSIS — N2581 Secondary hyperparathyroidism of renal origin: Secondary | ICD-10-CM | POA: Diagnosis not present

## 2018-09-12 DIAGNOSIS — E875 Hyperkalemia: Secondary | ICD-10-CM | POA: Diagnosis not present

## 2018-09-12 DIAGNOSIS — D509 Iron deficiency anemia, unspecified: Secondary | ICD-10-CM | POA: Diagnosis not present

## 2018-09-13 DIAGNOSIS — E440 Moderate protein-calorie malnutrition: Secondary | ICD-10-CM | POA: Diagnosis not present

## 2018-09-13 DIAGNOSIS — E890 Postprocedural hypothyroidism: Secondary | ICD-10-CM | POA: Diagnosis not present

## 2018-09-13 DIAGNOSIS — E790 Hyperuricemia without signs of inflammatory arthritis and tophaceous disease: Secondary | ICD-10-CM | POA: Diagnosis not present

## 2018-09-13 DIAGNOSIS — N2581 Secondary hyperparathyroidism of renal origin: Secondary | ICD-10-CM | POA: Diagnosis not present

## 2018-09-13 DIAGNOSIS — D631 Anemia in chronic kidney disease: Secondary | ICD-10-CM | POA: Diagnosis not present

## 2018-09-13 DIAGNOSIS — E875 Hyperkalemia: Secondary | ICD-10-CM | POA: Diagnosis not present

## 2018-09-13 DIAGNOSIS — N186 End stage renal disease: Secondary | ICD-10-CM | POA: Diagnosis not present

## 2018-09-13 DIAGNOSIS — D509 Iron deficiency anemia, unspecified: Secondary | ICD-10-CM | POA: Diagnosis not present

## 2018-09-14 DIAGNOSIS — E440 Moderate protein-calorie malnutrition: Secondary | ICD-10-CM | POA: Diagnosis not present

## 2018-09-14 DIAGNOSIS — E875 Hyperkalemia: Secondary | ICD-10-CM | POA: Diagnosis not present

## 2018-09-14 DIAGNOSIS — D509 Iron deficiency anemia, unspecified: Secondary | ICD-10-CM | POA: Diagnosis not present

## 2018-09-14 DIAGNOSIS — N186 End stage renal disease: Secondary | ICD-10-CM | POA: Diagnosis not present

## 2018-09-14 DIAGNOSIS — D631 Anemia in chronic kidney disease: Secondary | ICD-10-CM | POA: Diagnosis not present

## 2018-09-14 DIAGNOSIS — N2581 Secondary hyperparathyroidism of renal origin: Secondary | ICD-10-CM | POA: Diagnosis not present

## 2018-09-16 DIAGNOSIS — N186 End stage renal disease: Secondary | ICD-10-CM | POA: Diagnosis not present

## 2018-09-16 DIAGNOSIS — N2581 Secondary hyperparathyroidism of renal origin: Secondary | ICD-10-CM | POA: Diagnosis not present

## 2018-09-16 DIAGNOSIS — D631 Anemia in chronic kidney disease: Secondary | ICD-10-CM | POA: Diagnosis not present

## 2018-09-16 DIAGNOSIS — E440 Moderate protein-calorie malnutrition: Secondary | ICD-10-CM | POA: Diagnosis not present

## 2018-09-16 DIAGNOSIS — D509 Iron deficiency anemia, unspecified: Secondary | ICD-10-CM | POA: Diagnosis not present

## 2018-09-16 DIAGNOSIS — E875 Hyperkalemia: Secondary | ICD-10-CM | POA: Diagnosis not present

## 2018-09-17 DIAGNOSIS — N2581 Secondary hyperparathyroidism of renal origin: Secondary | ICD-10-CM | POA: Diagnosis not present

## 2018-09-17 DIAGNOSIS — D509 Iron deficiency anemia, unspecified: Secondary | ICD-10-CM | POA: Diagnosis not present

## 2018-09-17 DIAGNOSIS — E440 Moderate protein-calorie malnutrition: Secondary | ICD-10-CM | POA: Diagnosis not present

## 2018-09-17 DIAGNOSIS — N186 End stage renal disease: Secondary | ICD-10-CM | POA: Diagnosis not present

## 2018-09-17 DIAGNOSIS — D631 Anemia in chronic kidney disease: Secondary | ICD-10-CM | POA: Diagnosis not present

## 2018-09-17 DIAGNOSIS — E875 Hyperkalemia: Secondary | ICD-10-CM | POA: Diagnosis not present

## 2018-09-18 DIAGNOSIS — D509 Iron deficiency anemia, unspecified: Secondary | ICD-10-CM | POA: Diagnosis not present

## 2018-09-18 DIAGNOSIS — N2581 Secondary hyperparathyroidism of renal origin: Secondary | ICD-10-CM | POA: Diagnosis not present

## 2018-09-18 DIAGNOSIS — E875 Hyperkalemia: Secondary | ICD-10-CM | POA: Diagnosis not present

## 2018-09-18 DIAGNOSIS — E440 Moderate protein-calorie malnutrition: Secondary | ICD-10-CM | POA: Diagnosis not present

## 2018-09-18 DIAGNOSIS — D631 Anemia in chronic kidney disease: Secondary | ICD-10-CM | POA: Diagnosis not present

## 2018-09-18 DIAGNOSIS — N186 End stage renal disease: Secondary | ICD-10-CM | POA: Diagnosis not present

## 2018-09-19 DIAGNOSIS — E440 Moderate protein-calorie malnutrition: Secondary | ICD-10-CM | POA: Diagnosis not present

## 2018-09-19 DIAGNOSIS — N186 End stage renal disease: Secondary | ICD-10-CM | POA: Diagnosis not present

## 2018-09-19 DIAGNOSIS — E875 Hyperkalemia: Secondary | ICD-10-CM | POA: Diagnosis not present

## 2018-09-19 DIAGNOSIS — D631 Anemia in chronic kidney disease: Secondary | ICD-10-CM | POA: Diagnosis not present

## 2018-09-19 DIAGNOSIS — D509 Iron deficiency anemia, unspecified: Secondary | ICD-10-CM | POA: Diagnosis not present

## 2018-09-19 DIAGNOSIS — N2581 Secondary hyperparathyroidism of renal origin: Secondary | ICD-10-CM | POA: Diagnosis not present

## 2018-09-21 DIAGNOSIS — D631 Anemia in chronic kidney disease: Secondary | ICD-10-CM | POA: Diagnosis not present

## 2018-09-21 DIAGNOSIS — N2581 Secondary hyperparathyroidism of renal origin: Secondary | ICD-10-CM | POA: Diagnosis not present

## 2018-09-21 DIAGNOSIS — E875 Hyperkalemia: Secondary | ICD-10-CM | POA: Diagnosis not present

## 2018-09-21 DIAGNOSIS — E440 Moderate protein-calorie malnutrition: Secondary | ICD-10-CM | POA: Diagnosis not present

## 2018-09-21 DIAGNOSIS — D509 Iron deficiency anemia, unspecified: Secondary | ICD-10-CM | POA: Diagnosis not present

## 2018-09-21 DIAGNOSIS — N186 End stage renal disease: Secondary | ICD-10-CM | POA: Diagnosis not present

## 2018-09-23 DIAGNOSIS — E440 Moderate protein-calorie malnutrition: Secondary | ICD-10-CM | POA: Diagnosis not present

## 2018-09-23 DIAGNOSIS — E875 Hyperkalemia: Secondary | ICD-10-CM | POA: Diagnosis not present

## 2018-09-23 DIAGNOSIS — D631 Anemia in chronic kidney disease: Secondary | ICD-10-CM | POA: Diagnosis not present

## 2018-09-23 DIAGNOSIS — N186 End stage renal disease: Secondary | ICD-10-CM | POA: Diagnosis not present

## 2018-09-23 DIAGNOSIS — D509 Iron deficiency anemia, unspecified: Secondary | ICD-10-CM | POA: Diagnosis not present

## 2018-09-23 DIAGNOSIS — N2581 Secondary hyperparathyroidism of renal origin: Secondary | ICD-10-CM | POA: Diagnosis not present

## 2018-09-24 DIAGNOSIS — E440 Moderate protein-calorie malnutrition: Secondary | ICD-10-CM | POA: Diagnosis not present

## 2018-09-24 DIAGNOSIS — N186 End stage renal disease: Secondary | ICD-10-CM | POA: Diagnosis not present

## 2018-09-24 DIAGNOSIS — N2581 Secondary hyperparathyroidism of renal origin: Secondary | ICD-10-CM | POA: Diagnosis not present

## 2018-09-24 DIAGNOSIS — D509 Iron deficiency anemia, unspecified: Secondary | ICD-10-CM | POA: Diagnosis not present

## 2018-09-24 DIAGNOSIS — D631 Anemia in chronic kidney disease: Secondary | ICD-10-CM | POA: Diagnosis not present

## 2018-09-24 DIAGNOSIS — E875 Hyperkalemia: Secondary | ICD-10-CM | POA: Diagnosis not present

## 2018-09-25 DIAGNOSIS — E875 Hyperkalemia: Secondary | ICD-10-CM | POA: Diagnosis not present

## 2018-09-25 DIAGNOSIS — N186 End stage renal disease: Secondary | ICD-10-CM | POA: Diagnosis not present

## 2018-09-25 DIAGNOSIS — E440 Moderate protein-calorie malnutrition: Secondary | ICD-10-CM | POA: Diagnosis not present

## 2018-09-25 DIAGNOSIS — D631 Anemia in chronic kidney disease: Secondary | ICD-10-CM | POA: Diagnosis not present

## 2018-09-25 DIAGNOSIS — D509 Iron deficiency anemia, unspecified: Secondary | ICD-10-CM | POA: Diagnosis not present

## 2018-09-25 DIAGNOSIS — N2581 Secondary hyperparathyroidism of renal origin: Secondary | ICD-10-CM | POA: Diagnosis not present

## 2018-09-26 DIAGNOSIS — N186 End stage renal disease: Secondary | ICD-10-CM | POA: Diagnosis not present

## 2018-09-26 DIAGNOSIS — D631 Anemia in chronic kidney disease: Secondary | ICD-10-CM | POA: Diagnosis not present

## 2018-09-26 DIAGNOSIS — D509 Iron deficiency anemia, unspecified: Secondary | ICD-10-CM | POA: Diagnosis not present

## 2018-09-26 DIAGNOSIS — N2581 Secondary hyperparathyroidism of renal origin: Secondary | ICD-10-CM | POA: Diagnosis not present

## 2018-09-26 DIAGNOSIS — E875 Hyperkalemia: Secondary | ICD-10-CM | POA: Diagnosis not present

## 2018-09-26 DIAGNOSIS — E440 Moderate protein-calorie malnutrition: Secondary | ICD-10-CM | POA: Diagnosis not present

## 2018-09-27 ENCOUNTER — Ambulatory Visit: Payer: BLUE CROSS/BLUE SHIELD | Admitting: Podiatry

## 2018-09-28 DIAGNOSIS — D509 Iron deficiency anemia, unspecified: Secondary | ICD-10-CM | POA: Diagnosis not present

## 2018-09-28 DIAGNOSIS — E875 Hyperkalemia: Secondary | ICD-10-CM | POA: Diagnosis not present

## 2018-09-28 DIAGNOSIS — N2581 Secondary hyperparathyroidism of renal origin: Secondary | ICD-10-CM | POA: Diagnosis not present

## 2018-09-28 DIAGNOSIS — N186 End stage renal disease: Secondary | ICD-10-CM | POA: Diagnosis not present

## 2018-09-28 DIAGNOSIS — E440 Moderate protein-calorie malnutrition: Secondary | ICD-10-CM | POA: Diagnosis not present

## 2018-09-28 DIAGNOSIS — D631 Anemia in chronic kidney disease: Secondary | ICD-10-CM | POA: Diagnosis not present

## 2018-09-30 ENCOUNTER — Telehealth: Payer: Self-pay

## 2018-09-30 DIAGNOSIS — N2581 Secondary hyperparathyroidism of renal origin: Secondary | ICD-10-CM | POA: Diagnosis not present

## 2018-09-30 DIAGNOSIS — E875 Hyperkalemia: Secondary | ICD-10-CM | POA: Diagnosis not present

## 2018-09-30 DIAGNOSIS — D631 Anemia in chronic kidney disease: Secondary | ICD-10-CM | POA: Diagnosis not present

## 2018-09-30 DIAGNOSIS — N186 End stage renal disease: Secondary | ICD-10-CM | POA: Diagnosis not present

## 2018-09-30 DIAGNOSIS — E440 Moderate protein-calorie malnutrition: Secondary | ICD-10-CM | POA: Diagnosis not present

## 2018-09-30 DIAGNOSIS — D509 Iron deficiency anemia, unspecified: Secondary | ICD-10-CM | POA: Diagnosis not present

## 2018-09-30 NOTE — Telephone Encounter (Signed)
I call pts listed number three times. Each time I call someone pick up the phone and did not say anything. I called her name three times, and I stated where I was calling from. Each time no one answer. Will call again today when office close and on Monday in the am.

## 2018-10-01 DIAGNOSIS — D509 Iron deficiency anemia, unspecified: Secondary | ICD-10-CM | POA: Diagnosis not present

## 2018-10-01 DIAGNOSIS — E440 Moderate protein-calorie malnutrition: Secondary | ICD-10-CM | POA: Diagnosis not present

## 2018-10-01 DIAGNOSIS — E875 Hyperkalemia: Secondary | ICD-10-CM | POA: Diagnosis not present

## 2018-10-01 DIAGNOSIS — N2581 Secondary hyperparathyroidism of renal origin: Secondary | ICD-10-CM | POA: Diagnosis not present

## 2018-10-01 DIAGNOSIS — D631 Anemia in chronic kidney disease: Secondary | ICD-10-CM | POA: Diagnosis not present

## 2018-10-01 DIAGNOSIS — N186 End stage renal disease: Secondary | ICD-10-CM | POA: Diagnosis not present

## 2018-10-03 ENCOUNTER — Encounter: Payer: Self-pay | Admitting: Adult Health

## 2018-10-03 ENCOUNTER — Ambulatory Visit (INDEPENDENT_AMBULATORY_CARE_PROVIDER_SITE_OTHER): Payer: BLUE CROSS/BLUE SHIELD | Admitting: Adult Health

## 2018-10-03 ENCOUNTER — Other Ambulatory Visit: Payer: Self-pay

## 2018-10-03 DIAGNOSIS — D509 Iron deficiency anemia, unspecified: Secondary | ICD-10-CM | POA: Diagnosis not present

## 2018-10-03 DIAGNOSIS — E440 Moderate protein-calorie malnutrition: Secondary | ICD-10-CM | POA: Diagnosis not present

## 2018-10-03 DIAGNOSIS — D631 Anemia in chronic kidney disease: Secondary | ICD-10-CM | POA: Diagnosis not present

## 2018-10-03 DIAGNOSIS — R200 Anesthesia of skin: Secondary | ICD-10-CM

## 2018-10-03 DIAGNOSIS — R202 Paresthesia of skin: Secondary | ICD-10-CM

## 2018-10-03 DIAGNOSIS — E875 Hyperkalemia: Secondary | ICD-10-CM | POA: Diagnosis not present

## 2018-10-03 DIAGNOSIS — N2581 Secondary hyperparathyroidism of renal origin: Secondary | ICD-10-CM | POA: Diagnosis not present

## 2018-10-03 DIAGNOSIS — N186 End stage renal disease: Secondary | ICD-10-CM | POA: Diagnosis not present

## 2018-10-03 NOTE — Progress Notes (Signed)
Attempted telephone visit for which consent was obtained 30 minutes prior but unfortunately, patient did not answer phone call.  Voicemail left requesting return of phone call.  As patient has not called office at this time, request rescheduling

## 2018-10-05 DIAGNOSIS — N2589 Other disorders resulting from impaired renal tubular function: Secondary | ICD-10-CM | POA: Diagnosis not present

## 2018-10-05 DIAGNOSIS — E875 Hyperkalemia: Secondary | ICD-10-CM | POA: Diagnosis not present

## 2018-10-05 DIAGNOSIS — D509 Iron deficiency anemia, unspecified: Secondary | ICD-10-CM | POA: Diagnosis not present

## 2018-10-05 DIAGNOSIS — R17 Unspecified jaundice: Secondary | ICD-10-CM | POA: Diagnosis not present

## 2018-10-05 DIAGNOSIS — E8770 Fluid overload, unspecified: Secondary | ICD-10-CM | POA: Diagnosis not present

## 2018-10-05 DIAGNOSIS — N2581 Secondary hyperparathyroidism of renal origin: Secondary | ICD-10-CM | POA: Diagnosis not present

## 2018-10-05 DIAGNOSIS — N186 End stage renal disease: Secondary | ICD-10-CM | POA: Diagnosis not present

## 2018-10-05 DIAGNOSIS — Z5189 Encounter for other specified aftercare: Secondary | ICD-10-CM | POA: Diagnosis not present

## 2018-10-05 DIAGNOSIS — Z79899 Other long term (current) drug therapy: Secondary | ICD-10-CM | POA: Diagnosis not present

## 2018-10-05 DIAGNOSIS — E440 Moderate protein-calorie malnutrition: Secondary | ICD-10-CM | POA: Diagnosis not present

## 2018-10-05 IMAGING — DX DG CHEST 2V
2 series · 2 of 2 positions shown · non-contrast
Comparison: 12/04/2017

CLINICAL DATA: Extremity swelling and dyspnea

EXAM:
CHEST - 2 VIEW

[x chest ap]
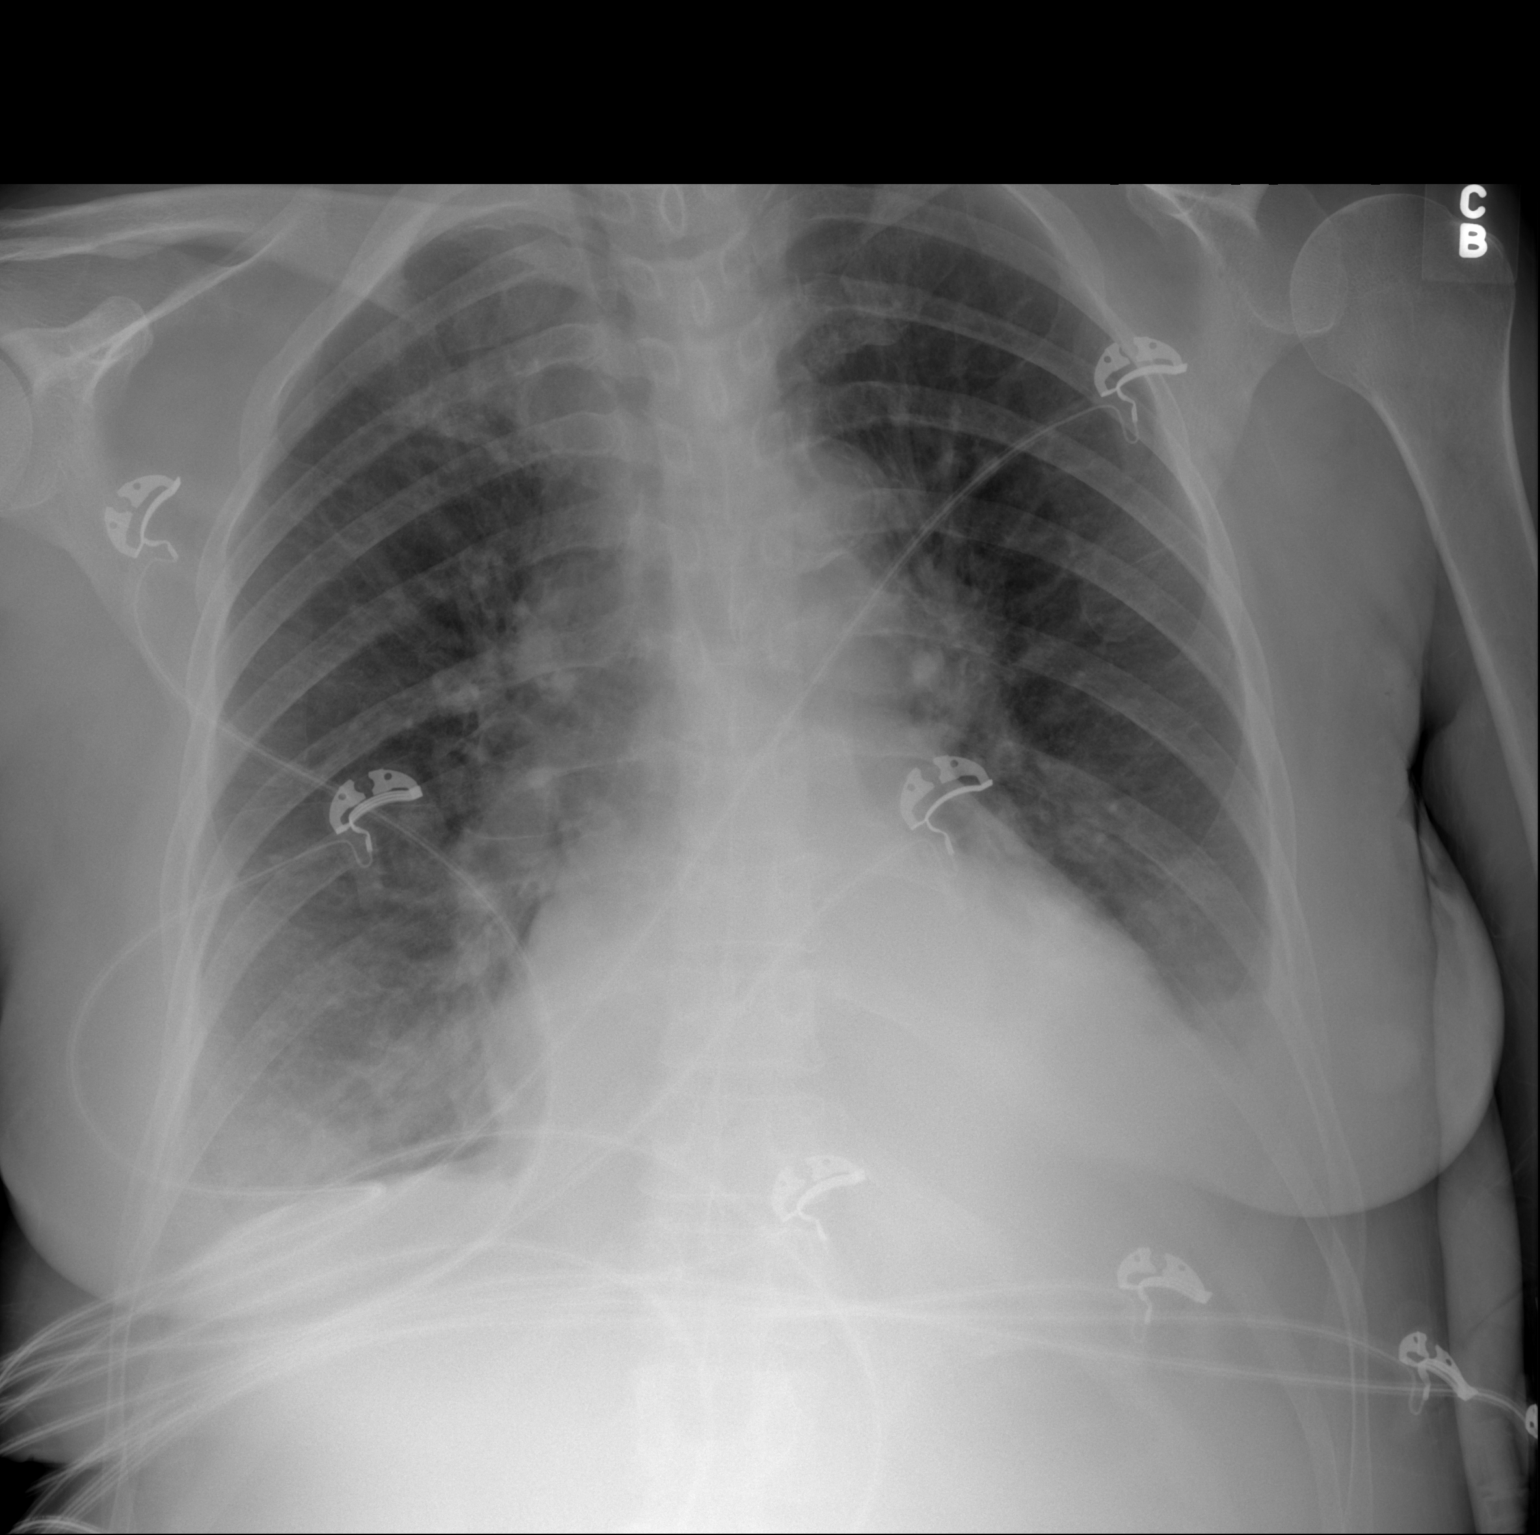

[w chest lat]
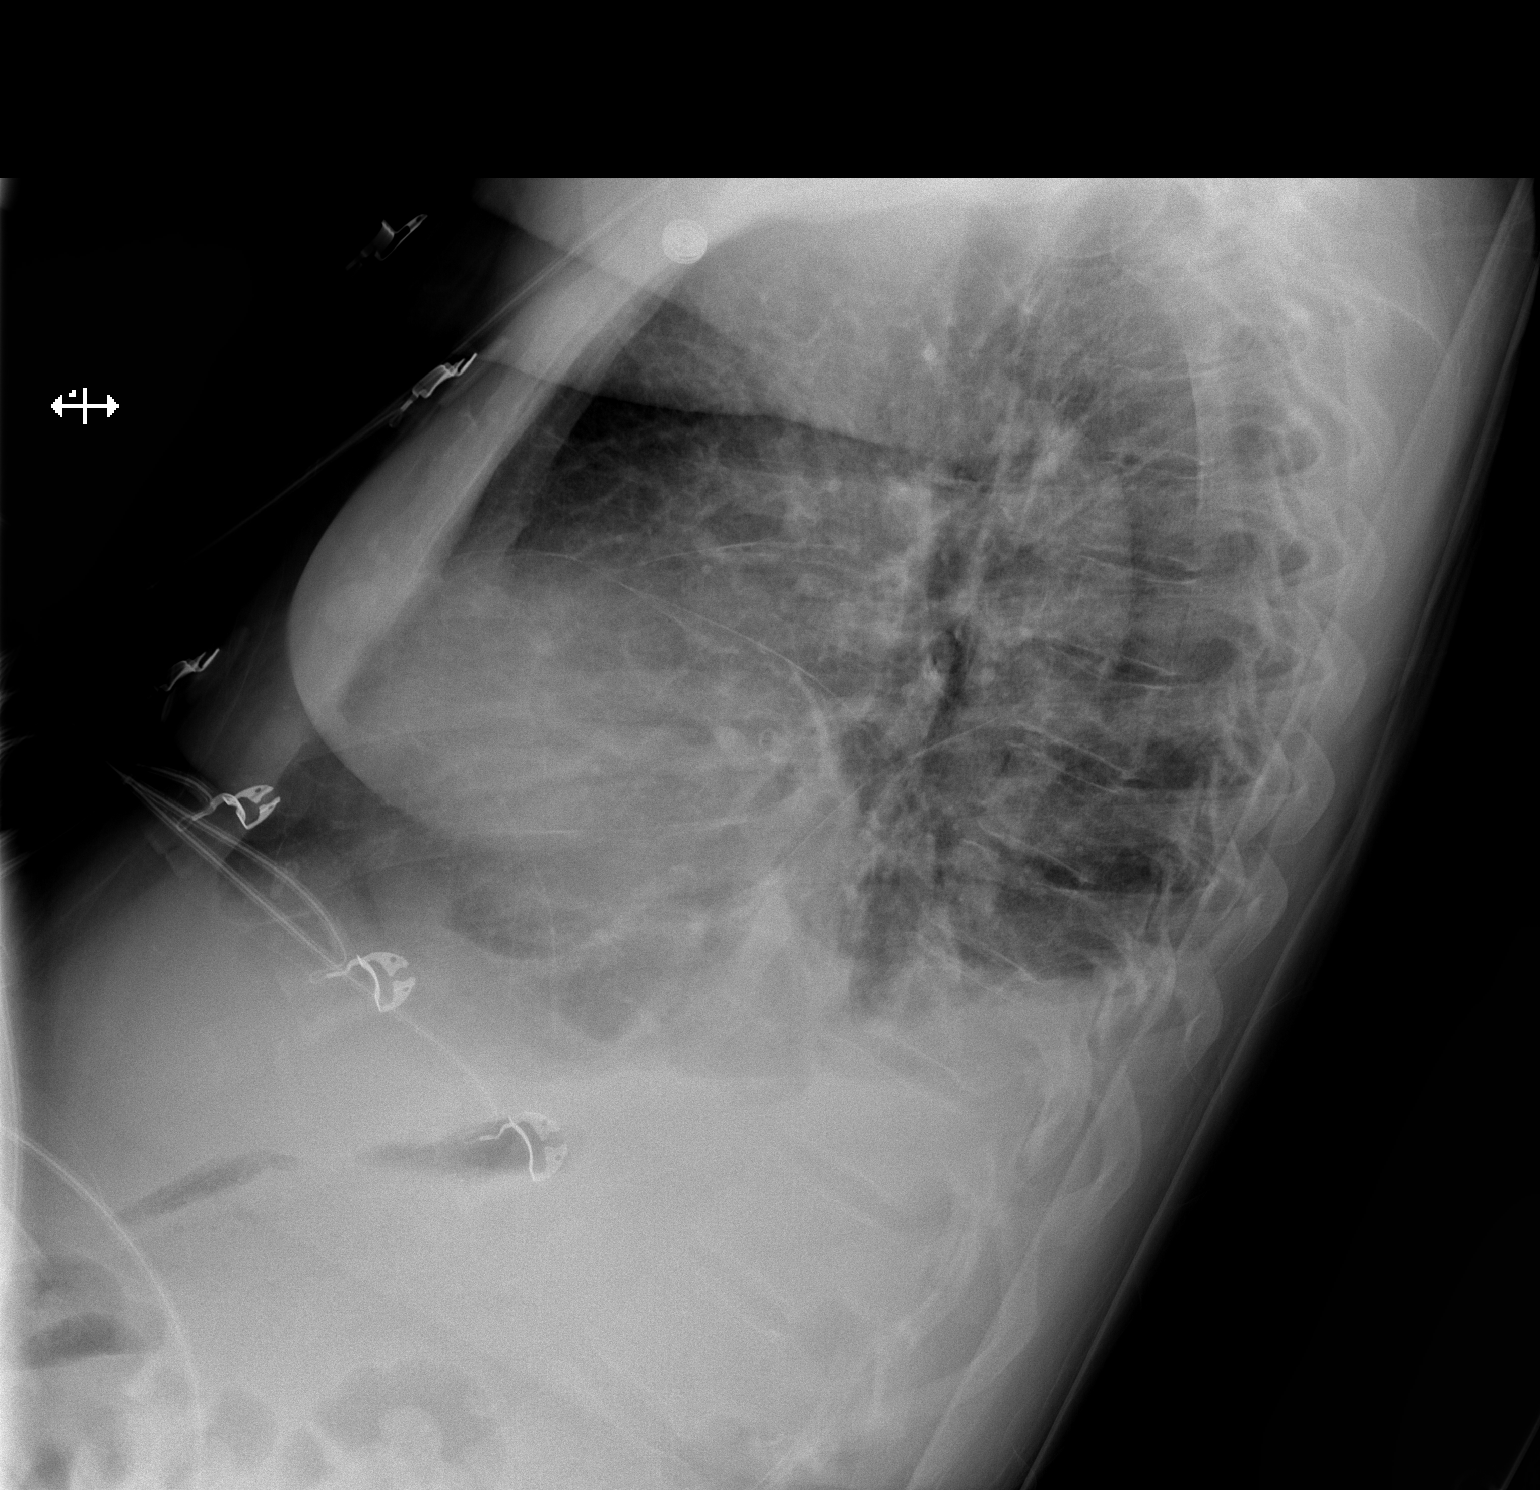

[2 of 2 positions shown; findings below may reference images not displayed]

FINDINGS: AP upright view of the chest. Pulmonary vascular redistribution,
cardiomegaly and small bilateral pleural effusions, left greater
than right are identified consistent with CHF. Aortic
atherosclerosis without aneurysmal dilatation is noted at the arch.
No acute fracture nor aggressive osseous lesions.
IMPRESSION: Cardiomegaly with mild CHF and small posterior left greater than
right pleural effusions.

## 2018-10-06 IMAGING — US IR US GUIDE VASC ACCESS LEFT
2 series · 4 of 4 positions shown · non-contrast
Comparison: none

INDICATION: 52-year-old with end-stage renal disease. Patient has poor venous
access and needs peripheral or central venous access.

[Series 2: ir us guide vasc access left · 3 of 3 slices shown (1 of 2)]
[im 1/3]
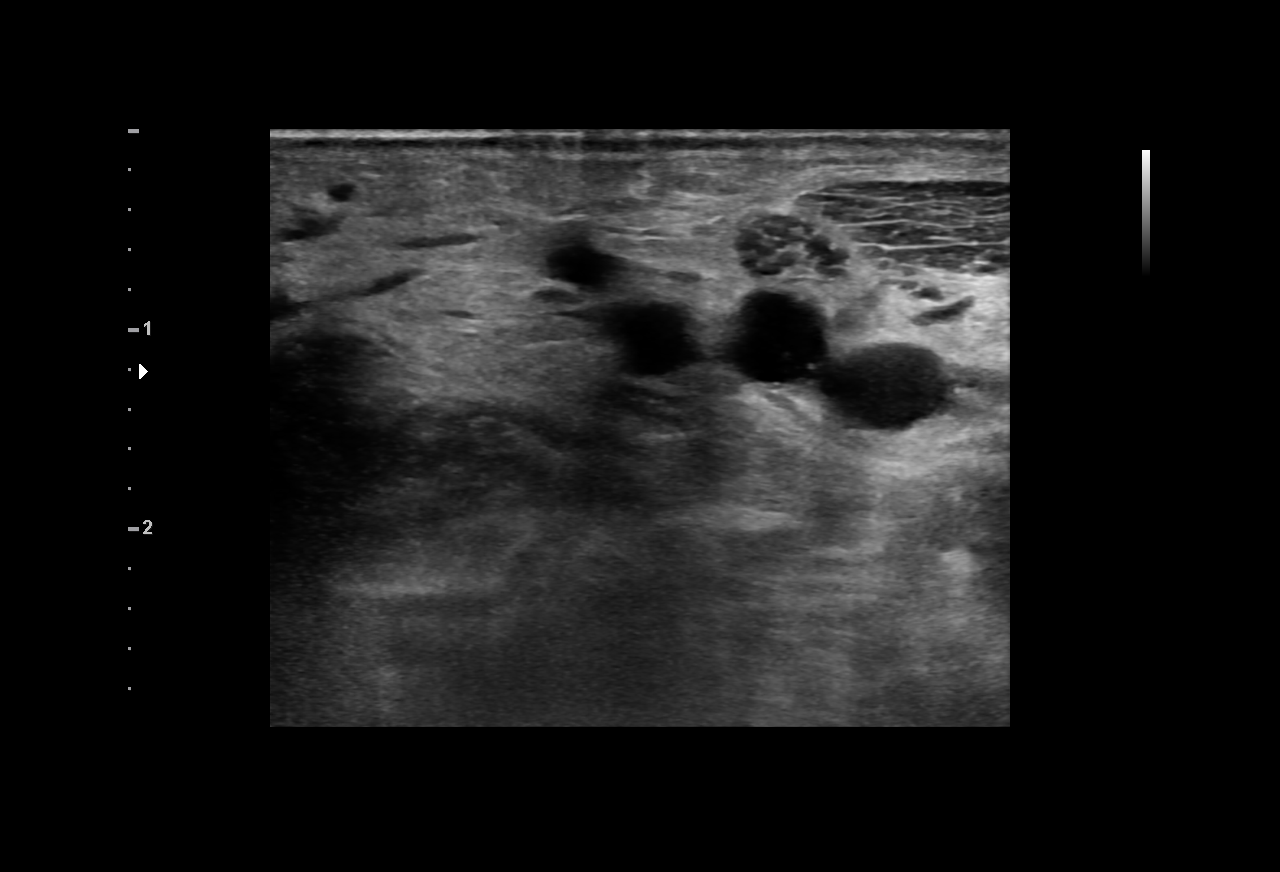
[im 2/3]
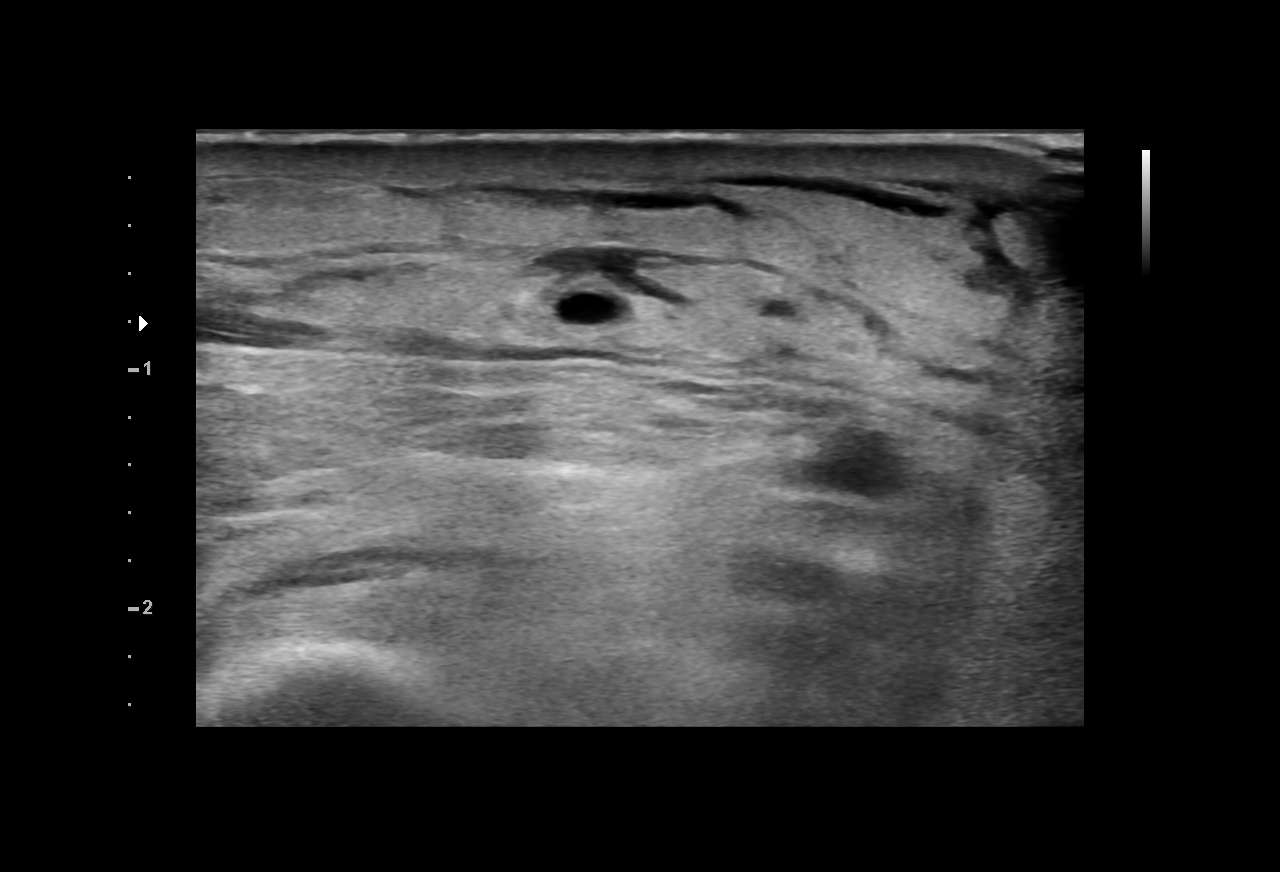
[im 3/3]
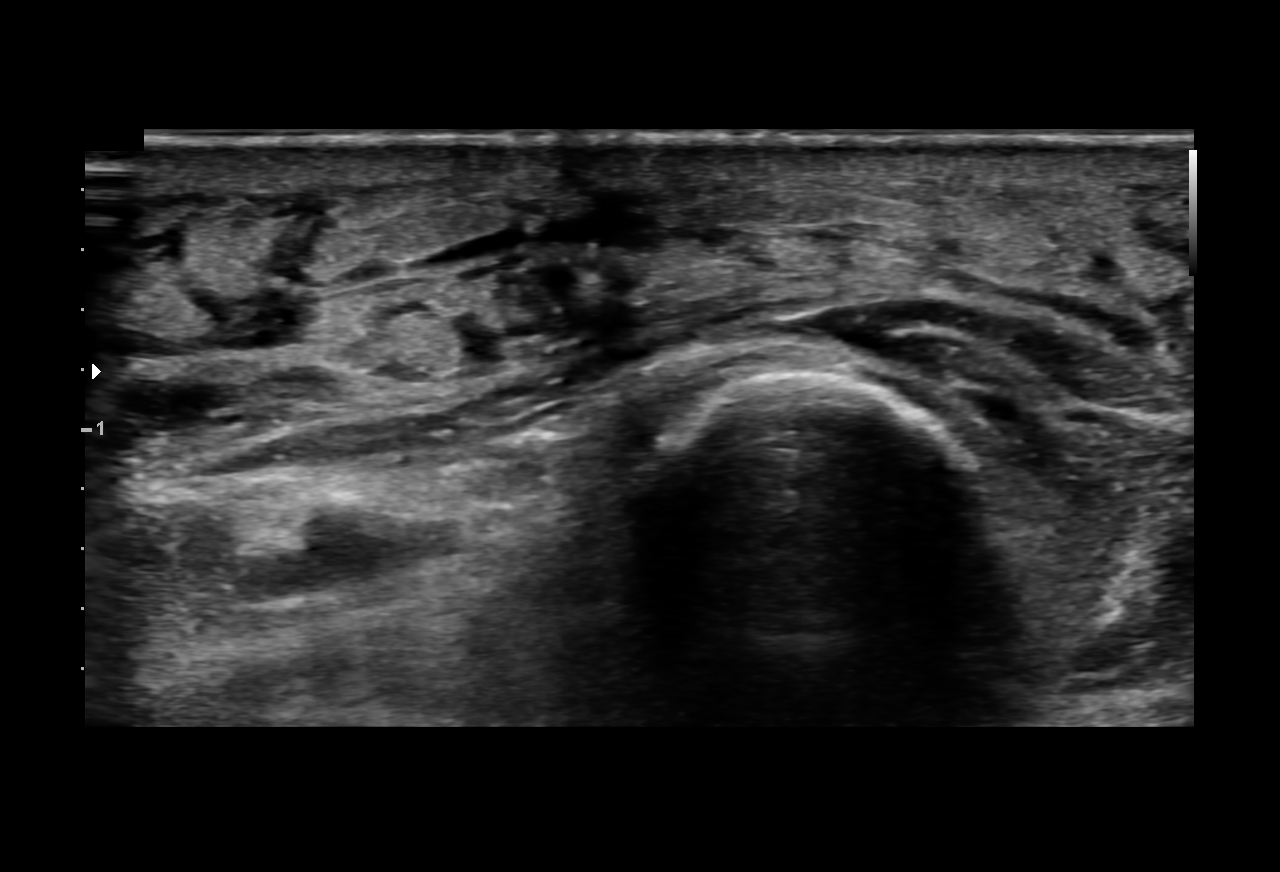

[Series 3: ir us guide vasc access left · 1 of 1 slices shown (2 of 2)]
[im 1/1]
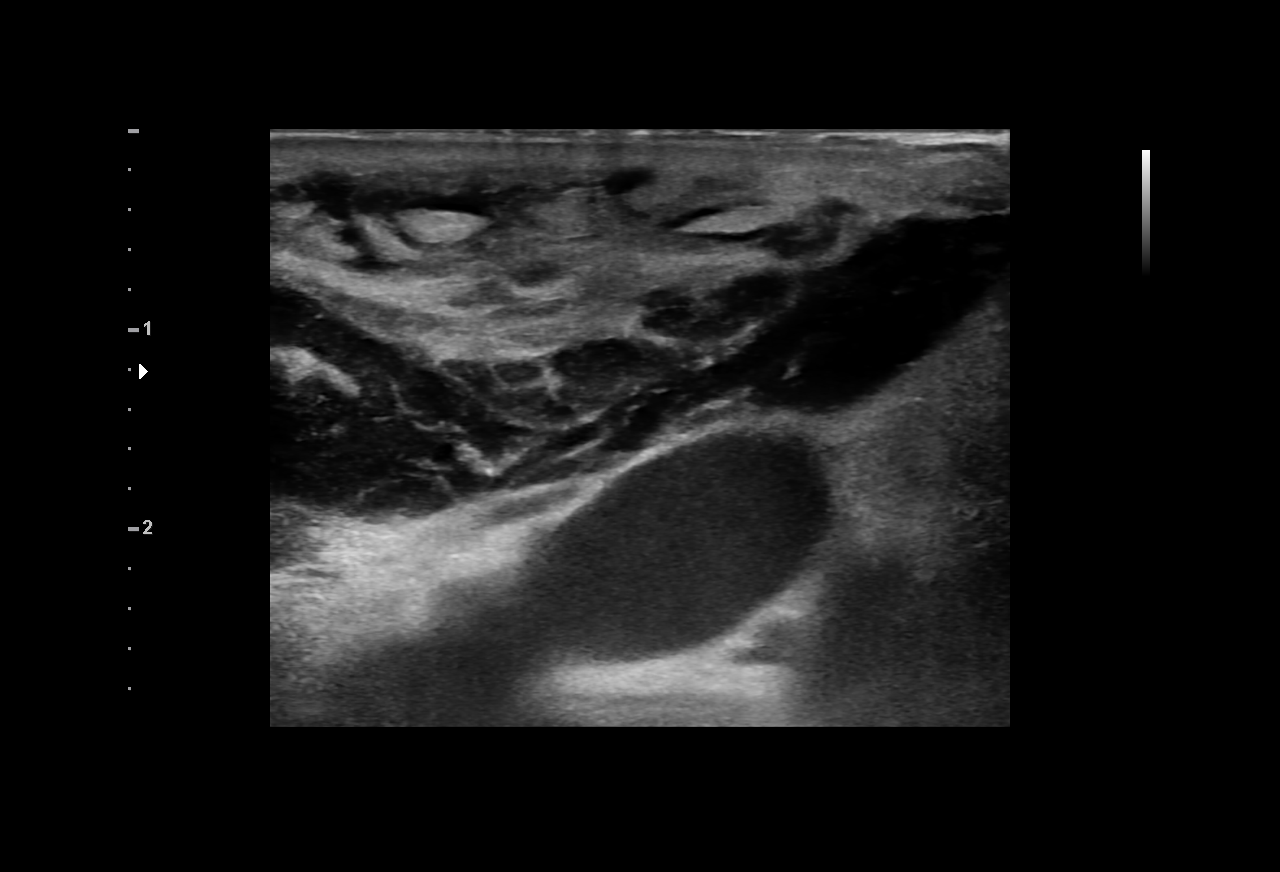

[4 of 4 positions shown; findings below may reference images not displayed]

EXAM:
FLUOROSCOPIC AND ULTRASOUND GUIDED PLACEMENT OF A NON TUNNELED
CENTRAL VENOUS CATHETER

MEDICATIONS:
None

ANESTHESIA/SEDATION:
None

FLUOROSCOPY TIME:  36 seconds, 3 mGy

COMPLICATIONS:
None immediate.

PROCEDURE:
Patent left cephalic vein in the left forearm was identified with
ultrasound. A tourniquet was placed. The forearm was prepped and
draped in sterile fashion. Multiple attempts were made to place a
peripheral IV in this vein with ultrasound guidance but the catheter
would not thread into the vein. Therefore, attention was directed to
placing a central venous catheter. Patient has a right arm fistula
and selected the left side of the neck and chest for central venous
access. Ultrasound confirmed a patent left internal jugular vein.
Ultrasound image was saved for documentation.

The procedure was explained to the patient. The risks and benefits
of the procedure were discussed and the patient's questions were
addressed. Informed consent was obtained from the patient. Left side
of the neck was prepped and draped in sterile fashion. Maximal
barrier sterile technique was utilized including caps, mask, sterile
gowns, sterile gloves, sterile drape, hand hygiene and skin
antiseptic. Skin was anesthetized with 1% lidocaine. 21 gauge needle
directed into the left internal jugular vein with ultrasound
guidance. Wire was advanced centrally. A peel-away sheath was
placed. A dual lumen Power PICC line was cut to 19 cm. Catheter was
advanced through the peel-away sheath and positioned in the lower
SVC. Both lumens aspirated and flushed well. Catheter was sutured to
skin.

Fluoroscopic and ultrasound images were taken and saved for
documentation.
FINDINGS: Left jugular central venous catheter. Catheter tip in the lower SVC.
IMPRESSION: Successful placement of a non tunneled central venous catheter with
ultrasound and fluoroscopic guidance.

## 2018-10-07 DIAGNOSIS — E440 Moderate protein-calorie malnutrition: Secondary | ICD-10-CM | POA: Diagnosis not present

## 2018-10-07 DIAGNOSIS — N2589 Other disorders resulting from impaired renal tubular function: Secondary | ICD-10-CM | POA: Diagnosis not present

## 2018-10-07 DIAGNOSIS — N2581 Secondary hyperparathyroidism of renal origin: Secondary | ICD-10-CM | POA: Diagnosis not present

## 2018-10-07 DIAGNOSIS — D509 Iron deficiency anemia, unspecified: Secondary | ICD-10-CM | POA: Diagnosis not present

## 2018-10-07 DIAGNOSIS — N186 End stage renal disease: Secondary | ICD-10-CM | POA: Diagnosis not present

## 2018-10-07 DIAGNOSIS — Z79899 Other long term (current) drug therapy: Secondary | ICD-10-CM | POA: Diagnosis not present

## 2018-10-08 DIAGNOSIS — N2581 Secondary hyperparathyroidism of renal origin: Secondary | ICD-10-CM | POA: Diagnosis not present

## 2018-10-08 DIAGNOSIS — N186 End stage renal disease: Secondary | ICD-10-CM | POA: Diagnosis not present

## 2018-10-08 DIAGNOSIS — N2589 Other disorders resulting from impaired renal tubular function: Secondary | ICD-10-CM | POA: Diagnosis not present

## 2018-10-08 DIAGNOSIS — E440 Moderate protein-calorie malnutrition: Secondary | ICD-10-CM | POA: Diagnosis not present

## 2018-10-08 DIAGNOSIS — Z79899 Other long term (current) drug therapy: Secondary | ICD-10-CM | POA: Diagnosis not present

## 2018-10-08 DIAGNOSIS — D509 Iron deficiency anemia, unspecified: Secondary | ICD-10-CM | POA: Diagnosis not present

## 2018-10-10 DIAGNOSIS — N2589 Other disorders resulting from impaired renal tubular function: Secondary | ICD-10-CM | POA: Diagnosis not present

## 2018-10-10 DIAGNOSIS — E440 Moderate protein-calorie malnutrition: Secondary | ICD-10-CM | POA: Diagnosis not present

## 2018-10-10 DIAGNOSIS — N186 End stage renal disease: Secondary | ICD-10-CM | POA: Diagnosis not present

## 2018-10-10 DIAGNOSIS — N2581 Secondary hyperparathyroidism of renal origin: Secondary | ICD-10-CM | POA: Diagnosis not present

## 2018-10-10 DIAGNOSIS — D509 Iron deficiency anemia, unspecified: Secondary | ICD-10-CM | POA: Diagnosis not present

## 2018-10-10 DIAGNOSIS — Z79899 Other long term (current) drug therapy: Secondary | ICD-10-CM | POA: Diagnosis not present

## 2018-10-11 DIAGNOSIS — E790 Hyperuricemia without signs of inflammatory arthritis and tophaceous disease: Secondary | ICD-10-CM | POA: Diagnosis not present

## 2018-10-11 DIAGNOSIS — Z79899 Other long term (current) drug therapy: Secondary | ICD-10-CM | POA: Diagnosis not present

## 2018-10-11 DIAGNOSIS — D509 Iron deficiency anemia, unspecified: Secondary | ICD-10-CM | POA: Diagnosis not present

## 2018-10-11 DIAGNOSIS — E440 Moderate protein-calorie malnutrition: Secondary | ICD-10-CM | POA: Diagnosis not present

## 2018-10-11 DIAGNOSIS — E890 Postprocedural hypothyroidism: Secondary | ICD-10-CM | POA: Diagnosis not present

## 2018-10-11 DIAGNOSIS — N2589 Other disorders resulting from impaired renal tubular function: Secondary | ICD-10-CM | POA: Diagnosis not present

## 2018-10-11 DIAGNOSIS — E7849 Other hyperlipidemia: Secondary | ICD-10-CM | POA: Diagnosis not present

## 2018-10-11 DIAGNOSIS — N2581 Secondary hyperparathyroidism of renal origin: Secondary | ICD-10-CM | POA: Diagnosis not present

## 2018-10-11 DIAGNOSIS — N186 End stage renal disease: Secondary | ICD-10-CM | POA: Diagnosis not present

## 2018-10-12 DIAGNOSIS — N2581 Secondary hyperparathyroidism of renal origin: Secondary | ICD-10-CM | POA: Diagnosis not present

## 2018-10-12 DIAGNOSIS — N186 End stage renal disease: Secondary | ICD-10-CM | POA: Diagnosis not present

## 2018-10-12 DIAGNOSIS — N2589 Other disorders resulting from impaired renal tubular function: Secondary | ICD-10-CM | POA: Diagnosis not present

## 2018-10-12 DIAGNOSIS — Z79899 Other long term (current) drug therapy: Secondary | ICD-10-CM | POA: Diagnosis not present

## 2018-10-12 DIAGNOSIS — E440 Moderate protein-calorie malnutrition: Secondary | ICD-10-CM | POA: Diagnosis not present

## 2018-10-12 DIAGNOSIS — D509 Iron deficiency anemia, unspecified: Secondary | ICD-10-CM | POA: Diagnosis not present

## 2018-10-14 DIAGNOSIS — Z79899 Other long term (current) drug therapy: Secondary | ICD-10-CM | POA: Diagnosis not present

## 2018-10-14 DIAGNOSIS — N186 End stage renal disease: Secondary | ICD-10-CM | POA: Diagnosis not present

## 2018-10-14 DIAGNOSIS — N2589 Other disorders resulting from impaired renal tubular function: Secondary | ICD-10-CM | POA: Diagnosis not present

## 2018-10-14 DIAGNOSIS — E440 Moderate protein-calorie malnutrition: Secondary | ICD-10-CM | POA: Diagnosis not present

## 2018-10-14 DIAGNOSIS — N2581 Secondary hyperparathyroidism of renal origin: Secondary | ICD-10-CM | POA: Diagnosis not present

## 2018-10-14 DIAGNOSIS — D509 Iron deficiency anemia, unspecified: Secondary | ICD-10-CM | POA: Diagnosis not present

## 2018-10-15 DIAGNOSIS — N2589 Other disorders resulting from impaired renal tubular function: Secondary | ICD-10-CM | POA: Diagnosis not present

## 2018-10-15 DIAGNOSIS — Z79899 Other long term (current) drug therapy: Secondary | ICD-10-CM | POA: Diagnosis not present

## 2018-10-15 DIAGNOSIS — E440 Moderate protein-calorie malnutrition: Secondary | ICD-10-CM | POA: Diagnosis not present

## 2018-10-15 DIAGNOSIS — D509 Iron deficiency anemia, unspecified: Secondary | ICD-10-CM | POA: Diagnosis not present

## 2018-10-15 DIAGNOSIS — N2581 Secondary hyperparathyroidism of renal origin: Secondary | ICD-10-CM | POA: Diagnosis not present

## 2018-10-15 DIAGNOSIS — N186 End stage renal disease: Secondary | ICD-10-CM | POA: Diagnosis not present

## 2018-10-17 DIAGNOSIS — E440 Moderate protein-calorie malnutrition: Secondary | ICD-10-CM | POA: Diagnosis not present

## 2018-10-17 DIAGNOSIS — Z79899 Other long term (current) drug therapy: Secondary | ICD-10-CM | POA: Diagnosis not present

## 2018-10-17 DIAGNOSIS — N186 End stage renal disease: Secondary | ICD-10-CM | POA: Diagnosis not present

## 2018-10-17 DIAGNOSIS — N2589 Other disorders resulting from impaired renal tubular function: Secondary | ICD-10-CM | POA: Diagnosis not present

## 2018-10-17 DIAGNOSIS — D509 Iron deficiency anemia, unspecified: Secondary | ICD-10-CM | POA: Diagnosis not present

## 2018-10-17 DIAGNOSIS — N2581 Secondary hyperparathyroidism of renal origin: Secondary | ICD-10-CM | POA: Diagnosis not present

## 2018-10-19 DIAGNOSIS — N2581 Secondary hyperparathyroidism of renal origin: Secondary | ICD-10-CM | POA: Diagnosis not present

## 2018-10-19 DIAGNOSIS — N2589 Other disorders resulting from impaired renal tubular function: Secondary | ICD-10-CM | POA: Diagnosis not present

## 2018-10-19 DIAGNOSIS — E440 Moderate protein-calorie malnutrition: Secondary | ICD-10-CM | POA: Diagnosis not present

## 2018-10-19 DIAGNOSIS — D509 Iron deficiency anemia, unspecified: Secondary | ICD-10-CM | POA: Diagnosis not present

## 2018-10-19 DIAGNOSIS — Z79899 Other long term (current) drug therapy: Secondary | ICD-10-CM | POA: Diagnosis not present

## 2018-10-19 DIAGNOSIS — N186 End stage renal disease: Secondary | ICD-10-CM | POA: Diagnosis not present

## 2018-10-21 DIAGNOSIS — N2589 Other disorders resulting from impaired renal tubular function: Secondary | ICD-10-CM | POA: Diagnosis not present

## 2018-10-21 DIAGNOSIS — N2581 Secondary hyperparathyroidism of renal origin: Secondary | ICD-10-CM | POA: Diagnosis not present

## 2018-10-21 DIAGNOSIS — N186 End stage renal disease: Secondary | ICD-10-CM | POA: Diagnosis not present

## 2018-10-21 DIAGNOSIS — D509 Iron deficiency anemia, unspecified: Secondary | ICD-10-CM | POA: Diagnosis not present

## 2018-10-21 DIAGNOSIS — Z79899 Other long term (current) drug therapy: Secondary | ICD-10-CM | POA: Diagnosis not present

## 2018-10-21 DIAGNOSIS — E440 Moderate protein-calorie malnutrition: Secondary | ICD-10-CM | POA: Diagnosis not present

## 2018-10-22 DIAGNOSIS — D509 Iron deficiency anemia, unspecified: Secondary | ICD-10-CM | POA: Diagnosis not present

## 2018-10-22 DIAGNOSIS — N2589 Other disorders resulting from impaired renal tubular function: Secondary | ICD-10-CM | POA: Diagnosis not present

## 2018-10-22 DIAGNOSIS — E440 Moderate protein-calorie malnutrition: Secondary | ICD-10-CM | POA: Diagnosis not present

## 2018-10-22 DIAGNOSIS — N2581 Secondary hyperparathyroidism of renal origin: Secondary | ICD-10-CM | POA: Diagnosis not present

## 2018-10-22 DIAGNOSIS — N186 End stage renal disease: Secondary | ICD-10-CM | POA: Diagnosis not present

## 2018-10-22 DIAGNOSIS — Z79899 Other long term (current) drug therapy: Secondary | ICD-10-CM | POA: Diagnosis not present

## 2018-10-24 DIAGNOSIS — N2589 Other disorders resulting from impaired renal tubular function: Secondary | ICD-10-CM | POA: Diagnosis not present

## 2018-10-24 DIAGNOSIS — N186 End stage renal disease: Secondary | ICD-10-CM | POA: Diagnosis not present

## 2018-10-24 DIAGNOSIS — N2581 Secondary hyperparathyroidism of renal origin: Secondary | ICD-10-CM | POA: Diagnosis not present

## 2018-10-24 DIAGNOSIS — D509 Iron deficiency anemia, unspecified: Secondary | ICD-10-CM | POA: Diagnosis not present

## 2018-10-24 DIAGNOSIS — Z79899 Other long term (current) drug therapy: Secondary | ICD-10-CM | POA: Diagnosis not present

## 2018-10-24 DIAGNOSIS — E440 Moderate protein-calorie malnutrition: Secondary | ICD-10-CM | POA: Diagnosis not present

## 2018-10-26 DIAGNOSIS — N2589 Other disorders resulting from impaired renal tubular function: Secondary | ICD-10-CM | POA: Diagnosis not present

## 2018-10-26 DIAGNOSIS — N2581 Secondary hyperparathyroidism of renal origin: Secondary | ICD-10-CM | POA: Diagnosis not present

## 2018-10-26 DIAGNOSIS — Z79899 Other long term (current) drug therapy: Secondary | ICD-10-CM | POA: Diagnosis not present

## 2018-10-26 DIAGNOSIS — N186 End stage renal disease: Secondary | ICD-10-CM | POA: Diagnosis not present

## 2018-10-26 DIAGNOSIS — E440 Moderate protein-calorie malnutrition: Secondary | ICD-10-CM | POA: Diagnosis not present

## 2018-10-26 DIAGNOSIS — D509 Iron deficiency anemia, unspecified: Secondary | ICD-10-CM | POA: Diagnosis not present

## 2018-10-28 DIAGNOSIS — N2581 Secondary hyperparathyroidism of renal origin: Secondary | ICD-10-CM | POA: Diagnosis not present

## 2018-10-28 DIAGNOSIS — D509 Iron deficiency anemia, unspecified: Secondary | ICD-10-CM | POA: Diagnosis not present

## 2018-10-28 DIAGNOSIS — Z79899 Other long term (current) drug therapy: Secondary | ICD-10-CM | POA: Diagnosis not present

## 2018-10-28 DIAGNOSIS — N186 End stage renal disease: Secondary | ICD-10-CM | POA: Diagnosis not present

## 2018-10-28 DIAGNOSIS — N2589 Other disorders resulting from impaired renal tubular function: Secondary | ICD-10-CM | POA: Diagnosis not present

## 2018-10-28 DIAGNOSIS — E440 Moderate protein-calorie malnutrition: Secondary | ICD-10-CM | POA: Diagnosis not present

## 2018-10-29 DIAGNOSIS — Z79899 Other long term (current) drug therapy: Secondary | ICD-10-CM | POA: Diagnosis not present

## 2018-10-29 DIAGNOSIS — N2581 Secondary hyperparathyroidism of renal origin: Secondary | ICD-10-CM | POA: Diagnosis not present

## 2018-10-29 DIAGNOSIS — E440 Moderate protein-calorie malnutrition: Secondary | ICD-10-CM | POA: Diagnosis not present

## 2018-10-29 DIAGNOSIS — D509 Iron deficiency anemia, unspecified: Secondary | ICD-10-CM | POA: Diagnosis not present

## 2018-10-29 DIAGNOSIS — N186 End stage renal disease: Secondary | ICD-10-CM | POA: Diagnosis not present

## 2018-10-29 DIAGNOSIS — N2589 Other disorders resulting from impaired renal tubular function: Secondary | ICD-10-CM | POA: Diagnosis not present

## 2018-10-31 DIAGNOSIS — D509 Iron deficiency anemia, unspecified: Secondary | ICD-10-CM | POA: Diagnosis not present

## 2018-10-31 DIAGNOSIS — E440 Moderate protein-calorie malnutrition: Secondary | ICD-10-CM | POA: Diagnosis not present

## 2018-10-31 DIAGNOSIS — N2581 Secondary hyperparathyroidism of renal origin: Secondary | ICD-10-CM | POA: Diagnosis not present

## 2018-10-31 DIAGNOSIS — N2589 Other disorders resulting from impaired renal tubular function: Secondary | ICD-10-CM | POA: Diagnosis not present

## 2018-10-31 DIAGNOSIS — Z79899 Other long term (current) drug therapy: Secondary | ICD-10-CM | POA: Diagnosis not present

## 2018-10-31 DIAGNOSIS — N186 End stage renal disease: Secondary | ICD-10-CM | POA: Diagnosis not present

## 2018-11-02 DIAGNOSIS — Z79899 Other long term (current) drug therapy: Secondary | ICD-10-CM | POA: Diagnosis not present

## 2018-11-02 DIAGNOSIS — D509 Iron deficiency anemia, unspecified: Secondary | ICD-10-CM | POA: Diagnosis not present

## 2018-11-02 DIAGNOSIS — E440 Moderate protein-calorie malnutrition: Secondary | ICD-10-CM | POA: Diagnosis not present

## 2018-11-02 DIAGNOSIS — N186 End stage renal disease: Secondary | ICD-10-CM | POA: Diagnosis not present

## 2018-11-02 DIAGNOSIS — N2581 Secondary hyperparathyroidism of renal origin: Secondary | ICD-10-CM | POA: Diagnosis not present

## 2018-11-02 DIAGNOSIS — N2589 Other disorders resulting from impaired renal tubular function: Secondary | ICD-10-CM | POA: Diagnosis not present

## 2018-11-03 DIAGNOSIS — N041 Nephrotic syndrome with focal and segmental glomerular lesions: Secondary | ICD-10-CM | POA: Diagnosis not present

## 2018-11-03 DIAGNOSIS — N186 End stage renal disease: Secondary | ICD-10-CM | POA: Diagnosis not present

## 2018-11-03 DIAGNOSIS — Z992 Dependence on renal dialysis: Secondary | ICD-10-CM | POA: Diagnosis not present

## 2018-11-04 DIAGNOSIS — E440 Moderate protein-calorie malnutrition: Secondary | ICD-10-CM | POA: Diagnosis not present

## 2018-11-04 DIAGNOSIS — Z992 Dependence on renal dialysis: Secondary | ICD-10-CM | POA: Diagnosis not present

## 2018-11-04 DIAGNOSIS — D631 Anemia in chronic kidney disease: Secondary | ICD-10-CM | POA: Diagnosis not present

## 2018-11-04 DIAGNOSIS — E8770 Fluid overload, unspecified: Secondary | ICD-10-CM | POA: Diagnosis not present

## 2018-11-04 DIAGNOSIS — E875 Hyperkalemia: Secondary | ICD-10-CM | POA: Diagnosis not present

## 2018-11-04 DIAGNOSIS — R17 Unspecified jaundice: Secondary | ICD-10-CM | POA: Diagnosis not present

## 2018-11-04 DIAGNOSIS — Z5189 Encounter for other specified aftercare: Secondary | ICD-10-CM | POA: Diagnosis not present

## 2018-11-04 DIAGNOSIS — N2581 Secondary hyperparathyroidism of renal origin: Secondary | ICD-10-CM | POA: Diagnosis not present

## 2018-11-04 DIAGNOSIS — N186 End stage renal disease: Secondary | ICD-10-CM | POA: Diagnosis not present

## 2018-11-04 DIAGNOSIS — D509 Iron deficiency anemia, unspecified: Secondary | ICD-10-CM | POA: Diagnosis not present

## 2018-11-04 DIAGNOSIS — N041 Nephrotic syndrome with focal and segmental glomerular lesions: Secondary | ICD-10-CM | POA: Diagnosis not present

## 2018-11-04 DIAGNOSIS — Z79899 Other long term (current) drug therapy: Secondary | ICD-10-CM | POA: Diagnosis not present

## 2018-11-05 DIAGNOSIS — N186 End stage renal disease: Secondary | ICD-10-CM | POA: Diagnosis not present

## 2018-11-05 DIAGNOSIS — D631 Anemia in chronic kidney disease: Secondary | ICD-10-CM | POA: Diagnosis not present

## 2018-11-05 DIAGNOSIS — D509 Iron deficiency anemia, unspecified: Secondary | ICD-10-CM | POA: Diagnosis not present

## 2018-11-05 DIAGNOSIS — Z79899 Other long term (current) drug therapy: Secondary | ICD-10-CM | POA: Diagnosis not present

## 2018-11-05 DIAGNOSIS — R17 Unspecified jaundice: Secondary | ICD-10-CM | POA: Diagnosis not present

## 2018-11-05 DIAGNOSIS — N2581 Secondary hyperparathyroidism of renal origin: Secondary | ICD-10-CM | POA: Diagnosis not present

## 2018-11-07 DIAGNOSIS — E790 Hyperuricemia without signs of inflammatory arthritis and tophaceous disease: Secondary | ICD-10-CM | POA: Diagnosis not present

## 2018-11-07 DIAGNOSIS — R17 Unspecified jaundice: Secondary | ICD-10-CM | POA: Diagnosis not present

## 2018-11-07 DIAGNOSIS — E890 Postprocedural hypothyroidism: Secondary | ICD-10-CM | POA: Diagnosis not present

## 2018-11-07 DIAGNOSIS — N2581 Secondary hyperparathyroidism of renal origin: Secondary | ICD-10-CM | POA: Diagnosis not present

## 2018-11-07 DIAGNOSIS — Z79899 Other long term (current) drug therapy: Secondary | ICD-10-CM | POA: Diagnosis not present

## 2018-11-07 DIAGNOSIS — N186 End stage renal disease: Secondary | ICD-10-CM | POA: Diagnosis not present

## 2018-11-07 DIAGNOSIS — D509 Iron deficiency anemia, unspecified: Secondary | ICD-10-CM | POA: Diagnosis not present

## 2018-11-07 DIAGNOSIS — D631 Anemia in chronic kidney disease: Secondary | ICD-10-CM | POA: Diagnosis not present

## 2018-11-08 ENCOUNTER — Ambulatory Visit: Payer: BLUE CROSS/BLUE SHIELD | Admitting: Podiatry

## 2018-11-09 DIAGNOSIS — N2581 Secondary hyperparathyroidism of renal origin: Secondary | ICD-10-CM | POA: Diagnosis not present

## 2018-11-09 DIAGNOSIS — D631 Anemia in chronic kidney disease: Secondary | ICD-10-CM | POA: Diagnosis not present

## 2018-11-09 DIAGNOSIS — D509 Iron deficiency anemia, unspecified: Secondary | ICD-10-CM | POA: Diagnosis not present

## 2018-11-09 DIAGNOSIS — R17 Unspecified jaundice: Secondary | ICD-10-CM | POA: Diagnosis not present

## 2018-11-09 DIAGNOSIS — Z79899 Other long term (current) drug therapy: Secondary | ICD-10-CM | POA: Diagnosis not present

## 2018-11-09 DIAGNOSIS — N186 End stage renal disease: Secondary | ICD-10-CM | POA: Diagnosis not present

## 2018-11-11 DIAGNOSIS — D509 Iron deficiency anemia, unspecified: Secondary | ICD-10-CM | POA: Diagnosis not present

## 2018-11-11 DIAGNOSIS — R17 Unspecified jaundice: Secondary | ICD-10-CM | POA: Diagnosis not present

## 2018-11-11 DIAGNOSIS — Z79899 Other long term (current) drug therapy: Secondary | ICD-10-CM | POA: Diagnosis not present

## 2018-11-11 DIAGNOSIS — N186 End stage renal disease: Secondary | ICD-10-CM | POA: Diagnosis not present

## 2018-11-11 DIAGNOSIS — N2581 Secondary hyperparathyroidism of renal origin: Secondary | ICD-10-CM | POA: Diagnosis not present

## 2018-11-11 DIAGNOSIS — D631 Anemia in chronic kidney disease: Secondary | ICD-10-CM | POA: Diagnosis not present

## 2018-11-12 DIAGNOSIS — D631 Anemia in chronic kidney disease: Secondary | ICD-10-CM | POA: Diagnosis not present

## 2018-11-12 DIAGNOSIS — D509 Iron deficiency anemia, unspecified: Secondary | ICD-10-CM | POA: Diagnosis not present

## 2018-11-12 DIAGNOSIS — Z79899 Other long term (current) drug therapy: Secondary | ICD-10-CM | POA: Diagnosis not present

## 2018-11-12 DIAGNOSIS — N186 End stage renal disease: Secondary | ICD-10-CM | POA: Diagnosis not present

## 2018-11-12 DIAGNOSIS — N2581 Secondary hyperparathyroidism of renal origin: Secondary | ICD-10-CM | POA: Diagnosis not present

## 2018-11-12 DIAGNOSIS — R17 Unspecified jaundice: Secondary | ICD-10-CM | POA: Diagnosis not present

## 2018-11-14 DIAGNOSIS — Z79899 Other long term (current) drug therapy: Secondary | ICD-10-CM | POA: Diagnosis not present

## 2018-11-14 DIAGNOSIS — D631 Anemia in chronic kidney disease: Secondary | ICD-10-CM | POA: Diagnosis not present

## 2018-11-14 DIAGNOSIS — N186 End stage renal disease: Secondary | ICD-10-CM | POA: Diagnosis not present

## 2018-11-14 DIAGNOSIS — N2581 Secondary hyperparathyroidism of renal origin: Secondary | ICD-10-CM | POA: Diagnosis not present

## 2018-11-14 DIAGNOSIS — D509 Iron deficiency anemia, unspecified: Secondary | ICD-10-CM | POA: Diagnosis not present

## 2018-11-14 DIAGNOSIS — R17 Unspecified jaundice: Secondary | ICD-10-CM | POA: Diagnosis not present

## 2018-11-16 DIAGNOSIS — D509 Iron deficiency anemia, unspecified: Secondary | ICD-10-CM | POA: Diagnosis not present

## 2018-11-16 DIAGNOSIS — R17 Unspecified jaundice: Secondary | ICD-10-CM | POA: Diagnosis not present

## 2018-11-16 DIAGNOSIS — Z79899 Other long term (current) drug therapy: Secondary | ICD-10-CM | POA: Diagnosis not present

## 2018-11-16 DIAGNOSIS — N2581 Secondary hyperparathyroidism of renal origin: Secondary | ICD-10-CM | POA: Diagnosis not present

## 2018-11-16 DIAGNOSIS — N186 End stage renal disease: Secondary | ICD-10-CM | POA: Diagnosis not present

## 2018-11-16 DIAGNOSIS — D631 Anemia in chronic kidney disease: Secondary | ICD-10-CM | POA: Diagnosis not present

## 2018-11-18 DIAGNOSIS — R17 Unspecified jaundice: Secondary | ICD-10-CM | POA: Diagnosis not present

## 2018-11-18 DIAGNOSIS — D509 Iron deficiency anemia, unspecified: Secondary | ICD-10-CM | POA: Diagnosis not present

## 2018-11-18 DIAGNOSIS — N186 End stage renal disease: Secondary | ICD-10-CM | POA: Diagnosis not present

## 2018-11-18 DIAGNOSIS — D631 Anemia in chronic kidney disease: Secondary | ICD-10-CM | POA: Diagnosis not present

## 2018-11-18 DIAGNOSIS — N2581 Secondary hyperparathyroidism of renal origin: Secondary | ICD-10-CM | POA: Diagnosis not present

## 2018-11-18 DIAGNOSIS — Z79899 Other long term (current) drug therapy: Secondary | ICD-10-CM | POA: Diagnosis not present

## 2018-11-19 DIAGNOSIS — D631 Anemia in chronic kidney disease: Secondary | ICD-10-CM | POA: Diagnosis not present

## 2018-11-19 DIAGNOSIS — N2581 Secondary hyperparathyroidism of renal origin: Secondary | ICD-10-CM | POA: Diagnosis not present

## 2018-11-19 DIAGNOSIS — R17 Unspecified jaundice: Secondary | ICD-10-CM | POA: Diagnosis not present

## 2018-11-19 DIAGNOSIS — D509 Iron deficiency anemia, unspecified: Secondary | ICD-10-CM | POA: Diagnosis not present

## 2018-11-19 DIAGNOSIS — Z79899 Other long term (current) drug therapy: Secondary | ICD-10-CM | POA: Diagnosis not present

## 2018-11-19 DIAGNOSIS — N186 End stage renal disease: Secondary | ICD-10-CM | POA: Diagnosis not present

## 2018-11-21 DIAGNOSIS — D631 Anemia in chronic kidney disease: Secondary | ICD-10-CM | POA: Diagnosis not present

## 2018-11-21 DIAGNOSIS — R17 Unspecified jaundice: Secondary | ICD-10-CM | POA: Diagnosis not present

## 2018-11-21 DIAGNOSIS — Z79899 Other long term (current) drug therapy: Secondary | ICD-10-CM | POA: Diagnosis not present

## 2018-11-21 DIAGNOSIS — D509 Iron deficiency anemia, unspecified: Secondary | ICD-10-CM | POA: Diagnosis not present

## 2018-11-21 DIAGNOSIS — N2581 Secondary hyperparathyroidism of renal origin: Secondary | ICD-10-CM | POA: Diagnosis not present

## 2018-11-21 DIAGNOSIS — N186 End stage renal disease: Secondary | ICD-10-CM | POA: Diagnosis not present

## 2018-11-23 DIAGNOSIS — Z79899 Other long term (current) drug therapy: Secondary | ICD-10-CM | POA: Diagnosis not present

## 2018-11-23 DIAGNOSIS — D631 Anemia in chronic kidney disease: Secondary | ICD-10-CM | POA: Diagnosis not present

## 2018-11-23 DIAGNOSIS — D509 Iron deficiency anemia, unspecified: Secondary | ICD-10-CM | POA: Diagnosis not present

## 2018-11-23 DIAGNOSIS — R17 Unspecified jaundice: Secondary | ICD-10-CM | POA: Diagnosis not present

## 2018-11-23 DIAGNOSIS — N186 End stage renal disease: Secondary | ICD-10-CM | POA: Diagnosis not present

## 2018-11-23 DIAGNOSIS — N2581 Secondary hyperparathyroidism of renal origin: Secondary | ICD-10-CM | POA: Diagnosis not present

## 2018-11-25 DIAGNOSIS — R17 Unspecified jaundice: Secondary | ICD-10-CM | POA: Diagnosis not present

## 2018-11-25 DIAGNOSIS — N2581 Secondary hyperparathyroidism of renal origin: Secondary | ICD-10-CM | POA: Diagnosis not present

## 2018-11-25 DIAGNOSIS — Z79899 Other long term (current) drug therapy: Secondary | ICD-10-CM | POA: Diagnosis not present

## 2018-11-25 DIAGNOSIS — D509 Iron deficiency anemia, unspecified: Secondary | ICD-10-CM | POA: Diagnosis not present

## 2018-11-25 DIAGNOSIS — N186 End stage renal disease: Secondary | ICD-10-CM | POA: Diagnosis not present

## 2018-11-25 DIAGNOSIS — D631 Anemia in chronic kidney disease: Secondary | ICD-10-CM | POA: Diagnosis not present

## 2018-11-26 DIAGNOSIS — D631 Anemia in chronic kidney disease: Secondary | ICD-10-CM | POA: Diagnosis not present

## 2018-11-26 DIAGNOSIS — Z79899 Other long term (current) drug therapy: Secondary | ICD-10-CM | POA: Diagnosis not present

## 2018-11-26 DIAGNOSIS — N186 End stage renal disease: Secondary | ICD-10-CM | POA: Diagnosis not present

## 2018-11-26 DIAGNOSIS — N2581 Secondary hyperparathyroidism of renal origin: Secondary | ICD-10-CM | POA: Diagnosis not present

## 2018-11-26 DIAGNOSIS — D509 Iron deficiency anemia, unspecified: Secondary | ICD-10-CM | POA: Diagnosis not present

## 2018-11-26 DIAGNOSIS — R17 Unspecified jaundice: Secondary | ICD-10-CM | POA: Diagnosis not present

## 2018-11-28 DIAGNOSIS — D509 Iron deficiency anemia, unspecified: Secondary | ICD-10-CM | POA: Diagnosis not present

## 2018-11-28 DIAGNOSIS — D631 Anemia in chronic kidney disease: Secondary | ICD-10-CM | POA: Diagnosis not present

## 2018-11-28 DIAGNOSIS — N186 End stage renal disease: Secondary | ICD-10-CM | POA: Diagnosis not present

## 2018-11-28 DIAGNOSIS — Z79899 Other long term (current) drug therapy: Secondary | ICD-10-CM | POA: Diagnosis not present

## 2018-11-28 DIAGNOSIS — N2581 Secondary hyperparathyroidism of renal origin: Secondary | ICD-10-CM | POA: Diagnosis not present

## 2018-11-28 DIAGNOSIS — R17 Unspecified jaundice: Secondary | ICD-10-CM | POA: Diagnosis not present

## 2018-11-30 DIAGNOSIS — R17 Unspecified jaundice: Secondary | ICD-10-CM | POA: Diagnosis not present

## 2018-11-30 DIAGNOSIS — N2581 Secondary hyperparathyroidism of renal origin: Secondary | ICD-10-CM | POA: Diagnosis not present

## 2018-11-30 DIAGNOSIS — Z79899 Other long term (current) drug therapy: Secondary | ICD-10-CM | POA: Diagnosis not present

## 2018-11-30 DIAGNOSIS — D509 Iron deficiency anemia, unspecified: Secondary | ICD-10-CM | POA: Diagnosis not present

## 2018-11-30 DIAGNOSIS — D631 Anemia in chronic kidney disease: Secondary | ICD-10-CM | POA: Diagnosis not present

## 2018-11-30 DIAGNOSIS — N186 End stage renal disease: Secondary | ICD-10-CM | POA: Diagnosis not present

## 2018-12-01 DIAGNOSIS — E7800 Pure hypercholesterolemia, unspecified: Secondary | ICD-10-CM | POA: Diagnosis not present

## 2018-12-02 DIAGNOSIS — R17 Unspecified jaundice: Secondary | ICD-10-CM | POA: Diagnosis not present

## 2018-12-02 DIAGNOSIS — D631 Anemia in chronic kidney disease: Secondary | ICD-10-CM | POA: Diagnosis not present

## 2018-12-02 DIAGNOSIS — D509 Iron deficiency anemia, unspecified: Secondary | ICD-10-CM | POA: Diagnosis not present

## 2018-12-02 DIAGNOSIS — N186 End stage renal disease: Secondary | ICD-10-CM | POA: Diagnosis not present

## 2018-12-02 DIAGNOSIS — Z79899 Other long term (current) drug therapy: Secondary | ICD-10-CM | POA: Diagnosis not present

## 2018-12-02 DIAGNOSIS — N2581 Secondary hyperparathyroidism of renal origin: Secondary | ICD-10-CM | POA: Diagnosis not present

## 2018-12-03 DIAGNOSIS — N186 End stage renal disease: Secondary | ICD-10-CM | POA: Diagnosis not present

## 2018-12-03 DIAGNOSIS — R17 Unspecified jaundice: Secondary | ICD-10-CM | POA: Diagnosis not present

## 2018-12-03 DIAGNOSIS — D631 Anemia in chronic kidney disease: Secondary | ICD-10-CM | POA: Diagnosis not present

## 2018-12-03 DIAGNOSIS — D509 Iron deficiency anemia, unspecified: Secondary | ICD-10-CM | POA: Diagnosis not present

## 2018-12-03 DIAGNOSIS — N2581 Secondary hyperparathyroidism of renal origin: Secondary | ICD-10-CM | POA: Diagnosis not present

## 2018-12-03 DIAGNOSIS — Z79899 Other long term (current) drug therapy: Secondary | ICD-10-CM | POA: Diagnosis not present

## 2018-12-05 DIAGNOSIS — N041 Nephrotic syndrome with focal and segmental glomerular lesions: Secondary | ICD-10-CM | POA: Diagnosis not present

## 2018-12-05 DIAGNOSIS — Z992 Dependence on renal dialysis: Secondary | ICD-10-CM | POA: Diagnosis not present

## 2018-12-05 DIAGNOSIS — N2581 Secondary hyperparathyroidism of renal origin: Secondary | ICD-10-CM | POA: Diagnosis not present

## 2018-12-05 DIAGNOSIS — Z4931 Encounter for adequacy testing for hemodialysis: Secondary | ICD-10-CM | POA: Diagnosis not present

## 2018-12-05 DIAGNOSIS — E875 Hyperkalemia: Secondary | ICD-10-CM | POA: Diagnosis not present

## 2018-12-05 DIAGNOSIS — N186 End stage renal disease: Secondary | ICD-10-CM | POA: Diagnosis not present

## 2018-12-05 DIAGNOSIS — D509 Iron deficiency anemia, unspecified: Secondary | ICD-10-CM | POA: Diagnosis not present

## 2018-12-05 DIAGNOSIS — E8770 Fluid overload, unspecified: Secondary | ICD-10-CM | POA: Diagnosis not present

## 2018-12-06 DIAGNOSIS — Z992 Dependence on renal dialysis: Secondary | ICD-10-CM | POA: Diagnosis not present

## 2018-12-06 DIAGNOSIS — D631 Anemia in chronic kidney disease: Secondary | ICD-10-CM | POA: Diagnosis not present

## 2018-12-06 DIAGNOSIS — G5601 Carpal tunnel syndrome, right upper limb: Secondary | ICD-10-CM | POA: Diagnosis not present

## 2018-12-06 DIAGNOSIS — N042 Nephrotic syndrome with diffuse membranous glomerulonephritis: Secondary | ICD-10-CM | POA: Diagnosis not present

## 2018-12-06 DIAGNOSIS — Z0000 Encounter for general adult medical examination without abnormal findings: Secondary | ICD-10-CM | POA: Diagnosis not present

## 2018-12-06 DIAGNOSIS — N186 End stage renal disease: Secondary | ICD-10-CM | POA: Diagnosis not present

## 2018-12-06 DIAGNOSIS — G629 Polyneuropathy, unspecified: Secondary | ICD-10-CM | POA: Diagnosis not present

## 2018-12-06 DIAGNOSIS — K259 Gastric ulcer, unspecified as acute or chronic, without hemorrhage or perforation: Secondary | ICD-10-CM | POA: Diagnosis not present

## 2018-12-06 DIAGNOSIS — I129 Hypertensive chronic kidney disease with stage 1 through stage 4 chronic kidney disease, or unspecified chronic kidney disease: Secondary | ICD-10-CM | POA: Diagnosis not present

## 2018-12-06 DIAGNOSIS — G8194 Hemiplegia, unspecified affecting left nondominant side: Secondary | ICD-10-CM | POA: Diagnosis not present

## 2018-12-06 DIAGNOSIS — R809 Proteinuria, unspecified: Secondary | ICD-10-CM | POA: Diagnosis not present

## 2018-12-06 DIAGNOSIS — E7800 Pure hypercholesterolemia, unspecified: Secondary | ICD-10-CM | POA: Diagnosis not present

## 2018-12-07 DIAGNOSIS — N2581 Secondary hyperparathyroidism of renal origin: Secondary | ICD-10-CM | POA: Diagnosis not present

## 2018-12-07 DIAGNOSIS — E8770 Fluid overload, unspecified: Secondary | ICD-10-CM | POA: Diagnosis not present

## 2018-12-07 DIAGNOSIS — E875 Hyperkalemia: Secondary | ICD-10-CM | POA: Diagnosis not present

## 2018-12-07 DIAGNOSIS — Z4931 Encounter for adequacy testing for hemodialysis: Secondary | ICD-10-CM | POA: Diagnosis not present

## 2018-12-07 DIAGNOSIS — D509 Iron deficiency anemia, unspecified: Secondary | ICD-10-CM | POA: Diagnosis not present

## 2018-12-07 DIAGNOSIS — N186 End stage renal disease: Secondary | ICD-10-CM | POA: Diagnosis not present

## 2018-12-09 DIAGNOSIS — D509 Iron deficiency anemia, unspecified: Secondary | ICD-10-CM | POA: Diagnosis not present

## 2018-12-09 DIAGNOSIS — Z4931 Encounter for adequacy testing for hemodialysis: Secondary | ICD-10-CM | POA: Diagnosis not present

## 2018-12-09 DIAGNOSIS — E875 Hyperkalemia: Secondary | ICD-10-CM | POA: Diagnosis not present

## 2018-12-09 DIAGNOSIS — N2581 Secondary hyperparathyroidism of renal origin: Secondary | ICD-10-CM | POA: Diagnosis not present

## 2018-12-09 DIAGNOSIS — E8770 Fluid overload, unspecified: Secondary | ICD-10-CM | POA: Diagnosis not present

## 2018-12-09 DIAGNOSIS — N186 End stage renal disease: Secondary | ICD-10-CM | POA: Diagnosis not present

## 2018-12-10 DIAGNOSIS — Z4931 Encounter for adequacy testing for hemodialysis: Secondary | ICD-10-CM | POA: Diagnosis not present

## 2018-12-10 DIAGNOSIS — D509 Iron deficiency anemia, unspecified: Secondary | ICD-10-CM | POA: Diagnosis not present

## 2018-12-10 DIAGNOSIS — N2581 Secondary hyperparathyroidism of renal origin: Secondary | ICD-10-CM | POA: Diagnosis not present

## 2018-12-10 DIAGNOSIS — N186 End stage renal disease: Secondary | ICD-10-CM | POA: Diagnosis not present

## 2018-12-10 DIAGNOSIS — E875 Hyperkalemia: Secondary | ICD-10-CM | POA: Diagnosis not present

## 2018-12-10 DIAGNOSIS — E8770 Fluid overload, unspecified: Secondary | ICD-10-CM | POA: Diagnosis not present

## 2018-12-12 DIAGNOSIS — Z4931 Encounter for adequacy testing for hemodialysis: Secondary | ICD-10-CM | POA: Diagnosis not present

## 2018-12-12 DIAGNOSIS — N186 End stage renal disease: Secondary | ICD-10-CM | POA: Diagnosis not present

## 2018-12-12 DIAGNOSIS — E875 Hyperkalemia: Secondary | ICD-10-CM | POA: Diagnosis not present

## 2018-12-12 DIAGNOSIS — D509 Iron deficiency anemia, unspecified: Secondary | ICD-10-CM | POA: Diagnosis not present

## 2018-12-12 DIAGNOSIS — N2581 Secondary hyperparathyroidism of renal origin: Secondary | ICD-10-CM | POA: Diagnosis not present

## 2018-12-12 DIAGNOSIS — E8770 Fluid overload, unspecified: Secondary | ICD-10-CM | POA: Diagnosis not present

## 2018-12-14 DIAGNOSIS — D509 Iron deficiency anemia, unspecified: Secondary | ICD-10-CM | POA: Diagnosis not present

## 2018-12-14 DIAGNOSIS — E8770 Fluid overload, unspecified: Secondary | ICD-10-CM | POA: Diagnosis not present

## 2018-12-14 DIAGNOSIS — N186 End stage renal disease: Secondary | ICD-10-CM | POA: Diagnosis not present

## 2018-12-14 DIAGNOSIS — Z4931 Encounter for adequacy testing for hemodialysis: Secondary | ICD-10-CM | POA: Diagnosis not present

## 2018-12-14 DIAGNOSIS — E875 Hyperkalemia: Secondary | ICD-10-CM | POA: Diagnosis not present

## 2018-12-14 DIAGNOSIS — N2581 Secondary hyperparathyroidism of renal origin: Secondary | ICD-10-CM | POA: Diagnosis not present

## 2018-12-16 DIAGNOSIS — N2581 Secondary hyperparathyroidism of renal origin: Secondary | ICD-10-CM | POA: Diagnosis not present

## 2018-12-16 DIAGNOSIS — Z4931 Encounter for adequacy testing for hemodialysis: Secondary | ICD-10-CM | POA: Diagnosis not present

## 2018-12-16 DIAGNOSIS — N186 End stage renal disease: Secondary | ICD-10-CM | POA: Diagnosis not present

## 2018-12-16 DIAGNOSIS — D509 Iron deficiency anemia, unspecified: Secondary | ICD-10-CM | POA: Diagnosis not present

## 2018-12-16 DIAGNOSIS — E875 Hyperkalemia: Secondary | ICD-10-CM | POA: Diagnosis not present

## 2018-12-16 DIAGNOSIS — E8770 Fluid overload, unspecified: Secondary | ICD-10-CM | POA: Diagnosis not present

## 2018-12-17 DIAGNOSIS — N2581 Secondary hyperparathyroidism of renal origin: Secondary | ICD-10-CM | POA: Diagnosis not present

## 2018-12-17 DIAGNOSIS — E8770 Fluid overload, unspecified: Secondary | ICD-10-CM | POA: Diagnosis not present

## 2018-12-17 DIAGNOSIS — D509 Iron deficiency anemia, unspecified: Secondary | ICD-10-CM | POA: Diagnosis not present

## 2018-12-17 DIAGNOSIS — E875 Hyperkalemia: Secondary | ICD-10-CM | POA: Diagnosis not present

## 2018-12-17 DIAGNOSIS — N186 End stage renal disease: Secondary | ICD-10-CM | POA: Diagnosis not present

## 2018-12-17 DIAGNOSIS — Z4931 Encounter for adequacy testing for hemodialysis: Secondary | ICD-10-CM | POA: Diagnosis not present

## 2018-12-19 DIAGNOSIS — E875 Hyperkalemia: Secondary | ICD-10-CM | POA: Diagnosis not present

## 2018-12-19 DIAGNOSIS — N2581 Secondary hyperparathyroidism of renal origin: Secondary | ICD-10-CM | POA: Diagnosis not present

## 2018-12-19 DIAGNOSIS — N186 End stage renal disease: Secondary | ICD-10-CM | POA: Diagnosis not present

## 2018-12-19 DIAGNOSIS — Z4931 Encounter for adequacy testing for hemodialysis: Secondary | ICD-10-CM | POA: Diagnosis not present

## 2018-12-19 DIAGNOSIS — E8770 Fluid overload, unspecified: Secondary | ICD-10-CM | POA: Diagnosis not present

## 2018-12-19 DIAGNOSIS — D509 Iron deficiency anemia, unspecified: Secondary | ICD-10-CM | POA: Diagnosis not present

## 2018-12-20 ENCOUNTER — Ambulatory Visit (INDEPENDENT_AMBULATORY_CARE_PROVIDER_SITE_OTHER): Payer: Medicare Other | Admitting: Podiatry

## 2018-12-20 ENCOUNTER — Other Ambulatory Visit: Payer: Self-pay

## 2018-12-20 ENCOUNTER — Encounter: Payer: Self-pay | Admitting: Podiatry

## 2018-12-20 DIAGNOSIS — B351 Tinea unguium: Secondary | ICD-10-CM | POA: Diagnosis not present

## 2018-12-20 DIAGNOSIS — L608 Other nail disorders: Secondary | ICD-10-CM | POA: Diagnosis not present

## 2018-12-20 DIAGNOSIS — M79675 Pain in left toe(s): Secondary | ICD-10-CM | POA: Diagnosis not present

## 2018-12-20 DIAGNOSIS — M79674 Pain in right toe(s): Secondary | ICD-10-CM

## 2018-12-20 NOTE — Progress Notes (Signed)
Complaint:  Visit Type: Patient presents  to my office for  preventative foot care services. Complaint: Patient states" my nails have grown long and thick and become painful to walk and wear shoes" Patient has been diagnosed with CVA and ESRD. The patient presents for preventative foot care services. No changes to ROS  Podiatric Exam: Vascular: dorsalis pedis  are palpable bilateral.  Posterior tibial pulses are absent  B/L. Capillary return is immediate. Temperature gradient is WNL. Skin turgor WNL  Sensorium: Normal Semmes Weinstein monofilament test. Normal tactile sensation bilaterally. Nail Exam: Pt has thick disfigured discolored nails with subungual debris noted bilateral entire nail hallux through fifth toenails Ulcer Exam: There is no evidence of ulcer or pre-ulcerative changes or infection. Orthopedic Exam: Muscle tone and strength are WNL. No limitations in general ROM. No crepitus or effusions noted. Foot type and digits show no abnormalities. Bony prominences are unremarkable. Skin: No Porokeratosis. No infection or ulcers  Diagnosis:  Onychomycosis, , Pain in right toe, pain in left toes  Treatment & Plan Procedures and Treatment: Consent by patient was obtained for treatment procedures.   Debridement of mycotic and hypertrophic toenails, 1 through 5 bilateral and clearing of subungual debris. No ulceration, no infection noted.  Return Visit-Office Procedure: Patient instructed to return to the office for a follow up visit 3 months for continued evaluation and treatment.    Gardiner Barefoot DPM

## 2018-12-21 DIAGNOSIS — N2581 Secondary hyperparathyroidism of renal origin: Secondary | ICD-10-CM | POA: Diagnosis not present

## 2018-12-21 DIAGNOSIS — E8770 Fluid overload, unspecified: Secondary | ICD-10-CM | POA: Diagnosis not present

## 2018-12-21 DIAGNOSIS — E875 Hyperkalemia: Secondary | ICD-10-CM | POA: Diagnosis not present

## 2018-12-21 DIAGNOSIS — Z4931 Encounter for adequacy testing for hemodialysis: Secondary | ICD-10-CM | POA: Diagnosis not present

## 2018-12-21 DIAGNOSIS — D509 Iron deficiency anemia, unspecified: Secondary | ICD-10-CM | POA: Diagnosis not present

## 2018-12-21 DIAGNOSIS — N186 End stage renal disease: Secondary | ICD-10-CM | POA: Diagnosis not present

## 2018-12-23 DIAGNOSIS — E8770 Fluid overload, unspecified: Secondary | ICD-10-CM | POA: Diagnosis not present

## 2018-12-23 DIAGNOSIS — N2581 Secondary hyperparathyroidism of renal origin: Secondary | ICD-10-CM | POA: Diagnosis not present

## 2018-12-23 DIAGNOSIS — D509 Iron deficiency anemia, unspecified: Secondary | ICD-10-CM | POA: Diagnosis not present

## 2018-12-23 DIAGNOSIS — N186 End stage renal disease: Secondary | ICD-10-CM | POA: Diagnosis not present

## 2018-12-23 DIAGNOSIS — Z4931 Encounter for adequacy testing for hemodialysis: Secondary | ICD-10-CM | POA: Diagnosis not present

## 2018-12-23 DIAGNOSIS — E875 Hyperkalemia: Secondary | ICD-10-CM | POA: Diagnosis not present

## 2018-12-24 DIAGNOSIS — N186 End stage renal disease: Secondary | ICD-10-CM | POA: Diagnosis not present

## 2018-12-24 DIAGNOSIS — N2581 Secondary hyperparathyroidism of renal origin: Secondary | ICD-10-CM | POA: Diagnosis not present

## 2018-12-24 DIAGNOSIS — E875 Hyperkalemia: Secondary | ICD-10-CM | POA: Diagnosis not present

## 2018-12-24 DIAGNOSIS — D509 Iron deficiency anemia, unspecified: Secondary | ICD-10-CM | POA: Diagnosis not present

## 2018-12-24 DIAGNOSIS — E8770 Fluid overload, unspecified: Secondary | ICD-10-CM | POA: Diagnosis not present

## 2018-12-24 DIAGNOSIS — Z4931 Encounter for adequacy testing for hemodialysis: Secondary | ICD-10-CM | POA: Diagnosis not present

## 2018-12-26 DIAGNOSIS — Z4931 Encounter for adequacy testing for hemodialysis: Secondary | ICD-10-CM | POA: Diagnosis not present

## 2018-12-26 DIAGNOSIS — E8770 Fluid overload, unspecified: Secondary | ICD-10-CM | POA: Diagnosis not present

## 2018-12-26 DIAGNOSIS — N2581 Secondary hyperparathyroidism of renal origin: Secondary | ICD-10-CM | POA: Diagnosis not present

## 2018-12-26 DIAGNOSIS — N186 End stage renal disease: Secondary | ICD-10-CM | POA: Diagnosis not present

## 2018-12-26 DIAGNOSIS — E875 Hyperkalemia: Secondary | ICD-10-CM | POA: Diagnosis not present

## 2018-12-26 DIAGNOSIS — D509 Iron deficiency anemia, unspecified: Secondary | ICD-10-CM | POA: Diagnosis not present

## 2018-12-28 DIAGNOSIS — E8770 Fluid overload, unspecified: Secondary | ICD-10-CM | POA: Diagnosis not present

## 2018-12-28 DIAGNOSIS — Z4931 Encounter for adequacy testing for hemodialysis: Secondary | ICD-10-CM | POA: Diagnosis not present

## 2018-12-28 DIAGNOSIS — N186 End stage renal disease: Secondary | ICD-10-CM | POA: Diagnosis not present

## 2018-12-28 DIAGNOSIS — E875 Hyperkalemia: Secondary | ICD-10-CM | POA: Diagnosis not present

## 2018-12-28 DIAGNOSIS — D509 Iron deficiency anemia, unspecified: Secondary | ICD-10-CM | POA: Diagnosis not present

## 2018-12-28 DIAGNOSIS — N2581 Secondary hyperparathyroidism of renal origin: Secondary | ICD-10-CM | POA: Diagnosis not present

## 2018-12-30 DIAGNOSIS — D509 Iron deficiency anemia, unspecified: Secondary | ICD-10-CM | POA: Diagnosis not present

## 2018-12-30 DIAGNOSIS — E8770 Fluid overload, unspecified: Secondary | ICD-10-CM | POA: Diagnosis not present

## 2018-12-30 DIAGNOSIS — N2581 Secondary hyperparathyroidism of renal origin: Secondary | ICD-10-CM | POA: Diagnosis not present

## 2018-12-30 DIAGNOSIS — N186 End stage renal disease: Secondary | ICD-10-CM | POA: Diagnosis not present

## 2018-12-30 DIAGNOSIS — E875 Hyperkalemia: Secondary | ICD-10-CM | POA: Diagnosis not present

## 2018-12-30 DIAGNOSIS — Z4931 Encounter for adequacy testing for hemodialysis: Secondary | ICD-10-CM | POA: Diagnosis not present

## 2018-12-31 DIAGNOSIS — N186 End stage renal disease: Secondary | ICD-10-CM | POA: Diagnosis not present

## 2018-12-31 DIAGNOSIS — Z4931 Encounter for adequacy testing for hemodialysis: Secondary | ICD-10-CM | POA: Diagnosis not present

## 2018-12-31 DIAGNOSIS — E875 Hyperkalemia: Secondary | ICD-10-CM | POA: Diagnosis not present

## 2018-12-31 DIAGNOSIS — D509 Iron deficiency anemia, unspecified: Secondary | ICD-10-CM | POA: Diagnosis not present

## 2018-12-31 DIAGNOSIS — N2581 Secondary hyperparathyroidism of renal origin: Secondary | ICD-10-CM | POA: Diagnosis not present

## 2018-12-31 DIAGNOSIS — E8770 Fluid overload, unspecified: Secondary | ICD-10-CM | POA: Diagnosis not present

## 2019-01-02 DIAGNOSIS — N186 End stage renal disease: Secondary | ICD-10-CM | POA: Diagnosis not present

## 2019-01-02 DIAGNOSIS — E8770 Fluid overload, unspecified: Secondary | ICD-10-CM | POA: Diagnosis not present

## 2019-01-02 DIAGNOSIS — D509 Iron deficiency anemia, unspecified: Secondary | ICD-10-CM | POA: Diagnosis not present

## 2019-01-02 DIAGNOSIS — Z4931 Encounter for adequacy testing for hemodialysis: Secondary | ICD-10-CM | POA: Diagnosis not present

## 2019-01-02 DIAGNOSIS — E875 Hyperkalemia: Secondary | ICD-10-CM | POA: Diagnosis not present

## 2019-01-02 DIAGNOSIS — N2581 Secondary hyperparathyroidism of renal origin: Secondary | ICD-10-CM | POA: Diagnosis not present

## 2019-01-04 DIAGNOSIS — N2581 Secondary hyperparathyroidism of renal origin: Secondary | ICD-10-CM | POA: Diagnosis not present

## 2019-01-04 DIAGNOSIS — E440 Moderate protein-calorie malnutrition: Secondary | ICD-10-CM | POA: Diagnosis not present

## 2019-01-04 DIAGNOSIS — D631 Anemia in chronic kidney disease: Secondary | ICD-10-CM | POA: Diagnosis not present

## 2019-01-04 DIAGNOSIS — E875 Hyperkalemia: Secondary | ICD-10-CM | POA: Diagnosis not present

## 2019-01-04 DIAGNOSIS — D509 Iron deficiency anemia, unspecified: Secondary | ICD-10-CM | POA: Diagnosis not present

## 2019-01-04 DIAGNOSIS — N2589 Other disorders resulting from impaired renal tubular function: Secondary | ICD-10-CM | POA: Diagnosis not present

## 2019-01-04 DIAGNOSIS — Z5189 Encounter for other specified aftercare: Secondary | ICD-10-CM | POA: Diagnosis not present

## 2019-01-04 DIAGNOSIS — Z79899 Other long term (current) drug therapy: Secondary | ICD-10-CM | POA: Diagnosis not present

## 2019-01-04 DIAGNOSIS — R17 Unspecified jaundice: Secondary | ICD-10-CM | POA: Diagnosis not present

## 2019-01-04 DIAGNOSIS — N186 End stage renal disease: Secondary | ICD-10-CM | POA: Diagnosis not present

## 2019-01-04 DIAGNOSIS — E8770 Fluid overload, unspecified: Secondary | ICD-10-CM | POA: Diagnosis not present

## 2019-01-06 DIAGNOSIS — D509 Iron deficiency anemia, unspecified: Secondary | ICD-10-CM | POA: Diagnosis not present

## 2019-01-06 DIAGNOSIS — N186 End stage renal disease: Secondary | ICD-10-CM | POA: Diagnosis not present

## 2019-01-06 DIAGNOSIS — N2581 Secondary hyperparathyroidism of renal origin: Secondary | ICD-10-CM | POA: Diagnosis not present

## 2019-01-06 DIAGNOSIS — D631 Anemia in chronic kidney disease: Secondary | ICD-10-CM | POA: Diagnosis not present

## 2019-01-06 DIAGNOSIS — N2589 Other disorders resulting from impaired renal tubular function: Secondary | ICD-10-CM | POA: Diagnosis not present

## 2019-01-06 DIAGNOSIS — E875 Hyperkalemia: Secondary | ICD-10-CM | POA: Diagnosis not present

## 2019-01-07 DIAGNOSIS — D631 Anemia in chronic kidney disease: Secondary | ICD-10-CM | POA: Diagnosis not present

## 2019-01-07 DIAGNOSIS — E875 Hyperkalemia: Secondary | ICD-10-CM | POA: Diagnosis not present

## 2019-01-07 DIAGNOSIS — N2589 Other disorders resulting from impaired renal tubular function: Secondary | ICD-10-CM | POA: Diagnosis not present

## 2019-01-07 DIAGNOSIS — N2581 Secondary hyperparathyroidism of renal origin: Secondary | ICD-10-CM | POA: Diagnosis not present

## 2019-01-07 DIAGNOSIS — D509 Iron deficiency anemia, unspecified: Secondary | ICD-10-CM | POA: Diagnosis not present

## 2019-01-07 DIAGNOSIS — N186 End stage renal disease: Secondary | ICD-10-CM | POA: Diagnosis not present

## 2019-01-08 DIAGNOSIS — D509 Iron deficiency anemia, unspecified: Secondary | ICD-10-CM | POA: Diagnosis not present

## 2019-01-08 DIAGNOSIS — E790 Hyperuricemia without signs of inflammatory arthritis and tophaceous disease: Secondary | ICD-10-CM | POA: Diagnosis not present

## 2019-01-08 DIAGNOSIS — E7849 Other hyperlipidemia: Secondary | ICD-10-CM | POA: Diagnosis not present

## 2019-01-08 DIAGNOSIS — E875 Hyperkalemia: Secondary | ICD-10-CM | POA: Diagnosis not present

## 2019-01-08 DIAGNOSIS — D631 Anemia in chronic kidney disease: Secondary | ICD-10-CM | POA: Diagnosis not present

## 2019-01-08 DIAGNOSIS — N186 End stage renal disease: Secondary | ICD-10-CM | POA: Diagnosis not present

## 2019-01-08 DIAGNOSIS — E890 Postprocedural hypothyroidism: Secondary | ICD-10-CM | POA: Diagnosis not present

## 2019-01-08 DIAGNOSIS — N2581 Secondary hyperparathyroidism of renal origin: Secondary | ICD-10-CM | POA: Diagnosis not present

## 2019-01-08 DIAGNOSIS — N2589 Other disorders resulting from impaired renal tubular function: Secondary | ICD-10-CM | POA: Diagnosis not present

## 2019-01-09 DIAGNOSIS — N2589 Other disorders resulting from impaired renal tubular function: Secondary | ICD-10-CM | POA: Diagnosis not present

## 2019-01-09 DIAGNOSIS — N186 End stage renal disease: Secondary | ICD-10-CM | POA: Diagnosis not present

## 2019-01-09 DIAGNOSIS — D631 Anemia in chronic kidney disease: Secondary | ICD-10-CM | POA: Diagnosis not present

## 2019-01-09 DIAGNOSIS — N2581 Secondary hyperparathyroidism of renal origin: Secondary | ICD-10-CM | POA: Diagnosis not present

## 2019-01-09 DIAGNOSIS — D509 Iron deficiency anemia, unspecified: Secondary | ICD-10-CM | POA: Diagnosis not present

## 2019-01-09 DIAGNOSIS — E875 Hyperkalemia: Secondary | ICD-10-CM | POA: Diagnosis not present

## 2019-01-11 DIAGNOSIS — D509 Iron deficiency anemia, unspecified: Secondary | ICD-10-CM | POA: Diagnosis not present

## 2019-01-11 DIAGNOSIS — N2589 Other disorders resulting from impaired renal tubular function: Secondary | ICD-10-CM | POA: Diagnosis not present

## 2019-01-11 DIAGNOSIS — E875 Hyperkalemia: Secondary | ICD-10-CM | POA: Diagnosis not present

## 2019-01-11 DIAGNOSIS — D631 Anemia in chronic kidney disease: Secondary | ICD-10-CM | POA: Diagnosis not present

## 2019-01-11 DIAGNOSIS — N186 End stage renal disease: Secondary | ICD-10-CM | POA: Diagnosis not present

## 2019-01-11 DIAGNOSIS — N2581 Secondary hyperparathyroidism of renal origin: Secondary | ICD-10-CM | POA: Diagnosis not present

## 2019-01-13 DIAGNOSIS — D631 Anemia in chronic kidney disease: Secondary | ICD-10-CM | POA: Diagnosis not present

## 2019-01-13 DIAGNOSIS — E875 Hyperkalemia: Secondary | ICD-10-CM | POA: Diagnosis not present

## 2019-01-13 DIAGNOSIS — N186 End stage renal disease: Secondary | ICD-10-CM | POA: Diagnosis not present

## 2019-01-13 DIAGNOSIS — N2581 Secondary hyperparathyroidism of renal origin: Secondary | ICD-10-CM | POA: Diagnosis not present

## 2019-01-13 DIAGNOSIS — D509 Iron deficiency anemia, unspecified: Secondary | ICD-10-CM | POA: Diagnosis not present

## 2019-01-13 DIAGNOSIS — N2589 Other disorders resulting from impaired renal tubular function: Secondary | ICD-10-CM | POA: Diagnosis not present

## 2019-01-14 DIAGNOSIS — N2581 Secondary hyperparathyroidism of renal origin: Secondary | ICD-10-CM | POA: Diagnosis not present

## 2019-01-14 DIAGNOSIS — N2589 Other disorders resulting from impaired renal tubular function: Secondary | ICD-10-CM | POA: Diagnosis not present

## 2019-01-14 DIAGNOSIS — E875 Hyperkalemia: Secondary | ICD-10-CM | POA: Diagnosis not present

## 2019-01-14 DIAGNOSIS — D509 Iron deficiency anemia, unspecified: Secondary | ICD-10-CM | POA: Diagnosis not present

## 2019-01-14 DIAGNOSIS — D631 Anemia in chronic kidney disease: Secondary | ICD-10-CM | POA: Diagnosis not present

## 2019-01-14 DIAGNOSIS — N186 End stage renal disease: Secondary | ICD-10-CM | POA: Diagnosis not present

## 2019-01-16 DIAGNOSIS — D509 Iron deficiency anemia, unspecified: Secondary | ICD-10-CM | POA: Diagnosis not present

## 2019-01-16 DIAGNOSIS — D631 Anemia in chronic kidney disease: Secondary | ICD-10-CM | POA: Diagnosis not present

## 2019-01-16 DIAGNOSIS — N186 End stage renal disease: Secondary | ICD-10-CM | POA: Diagnosis not present

## 2019-01-16 DIAGNOSIS — N2589 Other disorders resulting from impaired renal tubular function: Secondary | ICD-10-CM | POA: Diagnosis not present

## 2019-01-16 DIAGNOSIS — E875 Hyperkalemia: Secondary | ICD-10-CM | POA: Diagnosis not present

## 2019-01-16 DIAGNOSIS — N2581 Secondary hyperparathyroidism of renal origin: Secondary | ICD-10-CM | POA: Diagnosis not present

## 2019-01-17 DIAGNOSIS — N2589 Other disorders resulting from impaired renal tubular function: Secondary | ICD-10-CM | POA: Diagnosis not present

## 2019-01-17 DIAGNOSIS — D509 Iron deficiency anemia, unspecified: Secondary | ICD-10-CM | POA: Diagnosis not present

## 2019-01-17 DIAGNOSIS — E875 Hyperkalemia: Secondary | ICD-10-CM | POA: Diagnosis not present

## 2019-01-17 DIAGNOSIS — N186 End stage renal disease: Secondary | ICD-10-CM | POA: Diagnosis not present

## 2019-01-17 DIAGNOSIS — D631 Anemia in chronic kidney disease: Secondary | ICD-10-CM | POA: Diagnosis not present

## 2019-01-17 DIAGNOSIS — N2581 Secondary hyperparathyroidism of renal origin: Secondary | ICD-10-CM | POA: Diagnosis not present

## 2019-01-18 DIAGNOSIS — N186 End stage renal disease: Secondary | ICD-10-CM | POA: Diagnosis not present

## 2019-01-18 DIAGNOSIS — N2581 Secondary hyperparathyroidism of renal origin: Secondary | ICD-10-CM | POA: Diagnosis not present

## 2019-01-18 DIAGNOSIS — D631 Anemia in chronic kidney disease: Secondary | ICD-10-CM | POA: Diagnosis not present

## 2019-01-18 DIAGNOSIS — N2589 Other disorders resulting from impaired renal tubular function: Secondary | ICD-10-CM | POA: Diagnosis not present

## 2019-01-18 DIAGNOSIS — E875 Hyperkalemia: Secondary | ICD-10-CM | POA: Diagnosis not present

## 2019-01-18 DIAGNOSIS — D509 Iron deficiency anemia, unspecified: Secondary | ICD-10-CM | POA: Diagnosis not present

## 2019-01-20 DIAGNOSIS — D631 Anemia in chronic kidney disease: Secondary | ICD-10-CM | POA: Diagnosis not present

## 2019-01-20 DIAGNOSIS — E875 Hyperkalemia: Secondary | ICD-10-CM | POA: Diagnosis not present

## 2019-01-20 DIAGNOSIS — D509 Iron deficiency anemia, unspecified: Secondary | ICD-10-CM | POA: Diagnosis not present

## 2019-01-20 DIAGNOSIS — N2581 Secondary hyperparathyroidism of renal origin: Secondary | ICD-10-CM | POA: Diagnosis not present

## 2019-01-20 DIAGNOSIS — N186 End stage renal disease: Secondary | ICD-10-CM | POA: Diagnosis not present

## 2019-01-20 DIAGNOSIS — N2589 Other disorders resulting from impaired renal tubular function: Secondary | ICD-10-CM | POA: Diagnosis not present

## 2019-01-21 DIAGNOSIS — D509 Iron deficiency anemia, unspecified: Secondary | ICD-10-CM | POA: Diagnosis not present

## 2019-01-21 DIAGNOSIS — N2589 Other disorders resulting from impaired renal tubular function: Secondary | ICD-10-CM | POA: Diagnosis not present

## 2019-01-21 DIAGNOSIS — N2581 Secondary hyperparathyroidism of renal origin: Secondary | ICD-10-CM | POA: Diagnosis not present

## 2019-01-21 DIAGNOSIS — D631 Anemia in chronic kidney disease: Secondary | ICD-10-CM | POA: Diagnosis not present

## 2019-01-21 DIAGNOSIS — E875 Hyperkalemia: Secondary | ICD-10-CM | POA: Diagnosis not present

## 2019-01-21 DIAGNOSIS — N186 End stage renal disease: Secondary | ICD-10-CM | POA: Diagnosis not present

## 2019-01-23 DIAGNOSIS — N2589 Other disorders resulting from impaired renal tubular function: Secondary | ICD-10-CM | POA: Diagnosis not present

## 2019-01-23 DIAGNOSIS — E875 Hyperkalemia: Secondary | ICD-10-CM | POA: Diagnosis not present

## 2019-01-23 DIAGNOSIS — D631 Anemia in chronic kidney disease: Secondary | ICD-10-CM | POA: Diagnosis not present

## 2019-01-23 DIAGNOSIS — N186 End stage renal disease: Secondary | ICD-10-CM | POA: Diagnosis not present

## 2019-01-23 DIAGNOSIS — D509 Iron deficiency anemia, unspecified: Secondary | ICD-10-CM | POA: Diagnosis not present

## 2019-01-23 DIAGNOSIS — N2581 Secondary hyperparathyroidism of renal origin: Secondary | ICD-10-CM | POA: Diagnosis not present

## 2019-01-25 DIAGNOSIS — N186 End stage renal disease: Secondary | ICD-10-CM | POA: Diagnosis not present

## 2019-01-25 DIAGNOSIS — N2581 Secondary hyperparathyroidism of renal origin: Secondary | ICD-10-CM | POA: Diagnosis not present

## 2019-01-25 DIAGNOSIS — D509 Iron deficiency anemia, unspecified: Secondary | ICD-10-CM | POA: Diagnosis not present

## 2019-01-25 DIAGNOSIS — D631 Anemia in chronic kidney disease: Secondary | ICD-10-CM | POA: Diagnosis not present

## 2019-01-25 DIAGNOSIS — E875 Hyperkalemia: Secondary | ICD-10-CM | POA: Diagnosis not present

## 2019-01-25 DIAGNOSIS — N2589 Other disorders resulting from impaired renal tubular function: Secondary | ICD-10-CM | POA: Diagnosis not present

## 2019-01-27 DIAGNOSIS — N2581 Secondary hyperparathyroidism of renal origin: Secondary | ICD-10-CM | POA: Diagnosis not present

## 2019-01-27 DIAGNOSIS — E875 Hyperkalemia: Secondary | ICD-10-CM | POA: Diagnosis not present

## 2019-01-27 DIAGNOSIS — N2589 Other disorders resulting from impaired renal tubular function: Secondary | ICD-10-CM | POA: Diagnosis not present

## 2019-01-27 DIAGNOSIS — N186 End stage renal disease: Secondary | ICD-10-CM | POA: Diagnosis not present

## 2019-01-27 DIAGNOSIS — D631 Anemia in chronic kidney disease: Secondary | ICD-10-CM | POA: Diagnosis not present

## 2019-01-27 DIAGNOSIS — D509 Iron deficiency anemia, unspecified: Secondary | ICD-10-CM | POA: Diagnosis not present

## 2019-01-28 DIAGNOSIS — N2589 Other disorders resulting from impaired renal tubular function: Secondary | ICD-10-CM | POA: Diagnosis not present

## 2019-01-28 DIAGNOSIS — D509 Iron deficiency anemia, unspecified: Secondary | ICD-10-CM | POA: Diagnosis not present

## 2019-01-28 DIAGNOSIS — D631 Anemia in chronic kidney disease: Secondary | ICD-10-CM | POA: Diagnosis not present

## 2019-01-28 DIAGNOSIS — E875 Hyperkalemia: Secondary | ICD-10-CM | POA: Diagnosis not present

## 2019-01-28 DIAGNOSIS — N2581 Secondary hyperparathyroidism of renal origin: Secondary | ICD-10-CM | POA: Diagnosis not present

## 2019-01-28 DIAGNOSIS — N186 End stage renal disease: Secondary | ICD-10-CM | POA: Diagnosis not present

## 2019-01-29 DIAGNOSIS — N186 End stage renal disease: Secondary | ICD-10-CM | POA: Diagnosis not present

## 2019-01-29 DIAGNOSIS — D631 Anemia in chronic kidney disease: Secondary | ICD-10-CM | POA: Diagnosis not present

## 2019-01-29 DIAGNOSIS — D509 Iron deficiency anemia, unspecified: Secondary | ICD-10-CM | POA: Diagnosis not present

## 2019-01-29 DIAGNOSIS — E875 Hyperkalemia: Secondary | ICD-10-CM | POA: Diagnosis not present

## 2019-01-29 DIAGNOSIS — N2581 Secondary hyperparathyroidism of renal origin: Secondary | ICD-10-CM | POA: Diagnosis not present

## 2019-01-29 DIAGNOSIS — N2589 Other disorders resulting from impaired renal tubular function: Secondary | ICD-10-CM | POA: Diagnosis not present

## 2019-01-30 DIAGNOSIS — E875 Hyperkalemia: Secondary | ICD-10-CM | POA: Diagnosis not present

## 2019-01-30 DIAGNOSIS — D509 Iron deficiency anemia, unspecified: Secondary | ICD-10-CM | POA: Diagnosis not present

## 2019-01-30 DIAGNOSIS — D631 Anemia in chronic kidney disease: Secondary | ICD-10-CM | POA: Diagnosis not present

## 2019-01-30 DIAGNOSIS — N186 End stage renal disease: Secondary | ICD-10-CM | POA: Diagnosis not present

## 2019-01-30 DIAGNOSIS — N2581 Secondary hyperparathyroidism of renal origin: Secondary | ICD-10-CM | POA: Diagnosis not present

## 2019-01-30 DIAGNOSIS — N2589 Other disorders resulting from impaired renal tubular function: Secondary | ICD-10-CM | POA: Diagnosis not present

## 2019-02-01 DIAGNOSIS — N2581 Secondary hyperparathyroidism of renal origin: Secondary | ICD-10-CM | POA: Diagnosis not present

## 2019-02-01 DIAGNOSIS — E875 Hyperkalemia: Secondary | ICD-10-CM | POA: Diagnosis not present

## 2019-02-01 DIAGNOSIS — D631 Anemia in chronic kidney disease: Secondary | ICD-10-CM | POA: Diagnosis not present

## 2019-02-01 DIAGNOSIS — N186 End stage renal disease: Secondary | ICD-10-CM | POA: Diagnosis not present

## 2019-02-01 DIAGNOSIS — D509 Iron deficiency anemia, unspecified: Secondary | ICD-10-CM | POA: Diagnosis not present

## 2019-02-01 DIAGNOSIS — N2589 Other disorders resulting from impaired renal tubular function: Secondary | ICD-10-CM | POA: Diagnosis not present

## 2019-02-03 DIAGNOSIS — N2589 Other disorders resulting from impaired renal tubular function: Secondary | ICD-10-CM | POA: Diagnosis not present

## 2019-02-03 DIAGNOSIS — D631 Anemia in chronic kidney disease: Secondary | ICD-10-CM | POA: Diagnosis not present

## 2019-02-03 DIAGNOSIS — D509 Iron deficiency anemia, unspecified: Secondary | ICD-10-CM | POA: Diagnosis not present

## 2019-02-03 DIAGNOSIS — N2581 Secondary hyperparathyroidism of renal origin: Secondary | ICD-10-CM | POA: Diagnosis not present

## 2019-02-03 DIAGNOSIS — N041 Nephrotic syndrome with focal and segmental glomerular lesions: Secondary | ICD-10-CM | POA: Diagnosis not present

## 2019-02-03 DIAGNOSIS — E875 Hyperkalemia: Secondary | ICD-10-CM | POA: Diagnosis not present

## 2019-02-03 DIAGNOSIS — Z992 Dependence on renal dialysis: Secondary | ICD-10-CM | POA: Diagnosis not present

## 2019-02-03 DIAGNOSIS — N186 End stage renal disease: Secondary | ICD-10-CM | POA: Diagnosis not present

## 2019-02-04 DIAGNOSIS — D631 Anemia in chronic kidney disease: Secondary | ICD-10-CM | POA: Diagnosis not present

## 2019-02-04 DIAGNOSIS — R17 Unspecified jaundice: Secondary | ICD-10-CM | POA: Diagnosis not present

## 2019-02-04 DIAGNOSIS — N186 End stage renal disease: Secondary | ICD-10-CM | POA: Diagnosis not present

## 2019-02-04 DIAGNOSIS — Z4931 Encounter for adequacy testing for hemodialysis: Secondary | ICD-10-CM | POA: Diagnosis not present

## 2019-02-04 DIAGNOSIS — D509 Iron deficiency anemia, unspecified: Secondary | ICD-10-CM | POA: Diagnosis not present

## 2019-02-04 DIAGNOSIS — E8770 Fluid overload, unspecified: Secondary | ICD-10-CM | POA: Diagnosis not present

## 2019-02-04 DIAGNOSIS — E878 Other disorders of electrolyte and fluid balance, not elsewhere classified: Secondary | ICD-10-CM | POA: Diagnosis not present

## 2019-02-04 DIAGNOSIS — Z992 Dependence on renal dialysis: Secondary | ICD-10-CM | POA: Diagnosis not present

## 2019-02-04 DIAGNOSIS — N041 Nephrotic syndrome with focal and segmental glomerular lesions: Secondary | ICD-10-CM | POA: Diagnosis not present

## 2019-02-04 DIAGNOSIS — Z79899 Other long term (current) drug therapy: Secondary | ICD-10-CM | POA: Diagnosis not present

## 2019-02-04 DIAGNOSIS — N2581 Secondary hyperparathyroidism of renal origin: Secondary | ICD-10-CM | POA: Diagnosis not present

## 2019-02-04 DIAGNOSIS — E440 Moderate protein-calorie malnutrition: Secondary | ICD-10-CM | POA: Diagnosis not present

## 2019-02-04 DIAGNOSIS — E875 Hyperkalemia: Secondary | ICD-10-CM | POA: Diagnosis not present

## 2019-02-04 DIAGNOSIS — Z5189 Encounter for other specified aftercare: Secondary | ICD-10-CM | POA: Diagnosis not present

## 2019-02-06 DIAGNOSIS — E875 Hyperkalemia: Secondary | ICD-10-CM | POA: Diagnosis not present

## 2019-02-06 DIAGNOSIS — D631 Anemia in chronic kidney disease: Secondary | ICD-10-CM | POA: Diagnosis not present

## 2019-02-06 DIAGNOSIS — Z992 Dependence on renal dialysis: Secondary | ICD-10-CM | POA: Diagnosis not present

## 2019-02-06 DIAGNOSIS — N186 End stage renal disease: Secondary | ICD-10-CM | POA: Diagnosis not present

## 2019-02-06 DIAGNOSIS — R17 Unspecified jaundice: Secondary | ICD-10-CM | POA: Diagnosis not present

## 2019-02-06 DIAGNOSIS — E890 Postprocedural hypothyroidism: Secondary | ICD-10-CM | POA: Diagnosis not present

## 2019-02-06 DIAGNOSIS — E790 Hyperuricemia without signs of inflammatory arthritis and tophaceous disease: Secondary | ICD-10-CM | POA: Diagnosis not present

## 2019-02-06 DIAGNOSIS — N2581 Secondary hyperparathyroidism of renal origin: Secondary | ICD-10-CM | POA: Diagnosis not present

## 2019-02-08 DIAGNOSIS — E875 Hyperkalemia: Secondary | ICD-10-CM | POA: Diagnosis not present

## 2019-02-08 DIAGNOSIS — Z992 Dependence on renal dialysis: Secondary | ICD-10-CM | POA: Diagnosis not present

## 2019-02-08 DIAGNOSIS — N2581 Secondary hyperparathyroidism of renal origin: Secondary | ICD-10-CM | POA: Diagnosis not present

## 2019-02-08 DIAGNOSIS — R17 Unspecified jaundice: Secondary | ICD-10-CM | POA: Diagnosis not present

## 2019-02-08 DIAGNOSIS — D631 Anemia in chronic kidney disease: Secondary | ICD-10-CM | POA: Diagnosis not present

## 2019-02-08 DIAGNOSIS — N186 End stage renal disease: Secondary | ICD-10-CM | POA: Diagnosis not present

## 2019-02-10 DIAGNOSIS — N2581 Secondary hyperparathyroidism of renal origin: Secondary | ICD-10-CM | POA: Diagnosis not present

## 2019-02-10 DIAGNOSIS — Z992 Dependence on renal dialysis: Secondary | ICD-10-CM | POA: Diagnosis not present

## 2019-02-10 DIAGNOSIS — E875 Hyperkalemia: Secondary | ICD-10-CM | POA: Diagnosis not present

## 2019-02-10 DIAGNOSIS — R17 Unspecified jaundice: Secondary | ICD-10-CM | POA: Diagnosis not present

## 2019-02-10 DIAGNOSIS — D631 Anemia in chronic kidney disease: Secondary | ICD-10-CM | POA: Diagnosis not present

## 2019-02-10 DIAGNOSIS — N186 End stage renal disease: Secondary | ICD-10-CM | POA: Diagnosis not present

## 2019-02-11 DIAGNOSIS — D631 Anemia in chronic kidney disease: Secondary | ICD-10-CM | POA: Diagnosis not present

## 2019-02-11 DIAGNOSIS — N186 End stage renal disease: Secondary | ICD-10-CM | POA: Diagnosis not present

## 2019-02-11 DIAGNOSIS — R17 Unspecified jaundice: Secondary | ICD-10-CM | POA: Diagnosis not present

## 2019-02-11 DIAGNOSIS — N2581 Secondary hyperparathyroidism of renal origin: Secondary | ICD-10-CM | POA: Diagnosis not present

## 2019-02-11 DIAGNOSIS — Z992 Dependence on renal dialysis: Secondary | ICD-10-CM | POA: Diagnosis not present

## 2019-02-11 DIAGNOSIS — E875 Hyperkalemia: Secondary | ICD-10-CM | POA: Diagnosis not present

## 2019-02-12 DIAGNOSIS — Z992 Dependence on renal dialysis: Secondary | ICD-10-CM | POA: Diagnosis not present

## 2019-02-12 DIAGNOSIS — N2581 Secondary hyperparathyroidism of renal origin: Secondary | ICD-10-CM | POA: Diagnosis not present

## 2019-02-12 DIAGNOSIS — E875 Hyperkalemia: Secondary | ICD-10-CM | POA: Diagnosis not present

## 2019-02-12 DIAGNOSIS — D631 Anemia in chronic kidney disease: Secondary | ICD-10-CM | POA: Diagnosis not present

## 2019-02-12 DIAGNOSIS — R17 Unspecified jaundice: Secondary | ICD-10-CM | POA: Diagnosis not present

## 2019-02-12 DIAGNOSIS — N186 End stage renal disease: Secondary | ICD-10-CM | POA: Diagnosis not present

## 2019-02-13 DIAGNOSIS — D631 Anemia in chronic kidney disease: Secondary | ICD-10-CM | POA: Diagnosis not present

## 2019-02-13 DIAGNOSIS — N2581 Secondary hyperparathyroidism of renal origin: Secondary | ICD-10-CM | POA: Diagnosis not present

## 2019-02-13 DIAGNOSIS — E875 Hyperkalemia: Secondary | ICD-10-CM | POA: Diagnosis not present

## 2019-02-13 DIAGNOSIS — R17 Unspecified jaundice: Secondary | ICD-10-CM | POA: Diagnosis not present

## 2019-02-13 DIAGNOSIS — Z992 Dependence on renal dialysis: Secondary | ICD-10-CM | POA: Diagnosis not present

## 2019-02-13 DIAGNOSIS — N186 End stage renal disease: Secondary | ICD-10-CM | POA: Diagnosis not present

## 2019-02-15 DIAGNOSIS — R17 Unspecified jaundice: Secondary | ICD-10-CM | POA: Diagnosis not present

## 2019-02-15 DIAGNOSIS — N186 End stage renal disease: Secondary | ICD-10-CM | POA: Diagnosis not present

## 2019-02-15 DIAGNOSIS — E875 Hyperkalemia: Secondary | ICD-10-CM | POA: Diagnosis not present

## 2019-02-15 DIAGNOSIS — D631 Anemia in chronic kidney disease: Secondary | ICD-10-CM | POA: Diagnosis not present

## 2019-02-15 DIAGNOSIS — Z992 Dependence on renal dialysis: Secondary | ICD-10-CM | POA: Diagnosis not present

## 2019-02-15 DIAGNOSIS — N2581 Secondary hyperparathyroidism of renal origin: Secondary | ICD-10-CM | POA: Diagnosis not present

## 2019-02-17 DIAGNOSIS — Z992 Dependence on renal dialysis: Secondary | ICD-10-CM | POA: Diagnosis not present

## 2019-02-17 DIAGNOSIS — D631 Anemia in chronic kidney disease: Secondary | ICD-10-CM | POA: Diagnosis not present

## 2019-02-17 DIAGNOSIS — E875 Hyperkalemia: Secondary | ICD-10-CM | POA: Diagnosis not present

## 2019-02-17 DIAGNOSIS — N186 End stage renal disease: Secondary | ICD-10-CM | POA: Diagnosis not present

## 2019-02-17 DIAGNOSIS — N2581 Secondary hyperparathyroidism of renal origin: Secondary | ICD-10-CM | POA: Diagnosis not present

## 2019-02-17 DIAGNOSIS — R17 Unspecified jaundice: Secondary | ICD-10-CM | POA: Diagnosis not present

## 2019-02-18 DIAGNOSIS — N186 End stage renal disease: Secondary | ICD-10-CM | POA: Diagnosis not present

## 2019-02-18 DIAGNOSIS — Z992 Dependence on renal dialysis: Secondary | ICD-10-CM | POA: Diagnosis not present

## 2019-02-18 DIAGNOSIS — D631 Anemia in chronic kidney disease: Secondary | ICD-10-CM | POA: Diagnosis not present

## 2019-02-18 DIAGNOSIS — E875 Hyperkalemia: Secondary | ICD-10-CM | POA: Diagnosis not present

## 2019-02-18 DIAGNOSIS — N2581 Secondary hyperparathyroidism of renal origin: Secondary | ICD-10-CM | POA: Diagnosis not present

## 2019-02-18 DIAGNOSIS — R17 Unspecified jaundice: Secondary | ICD-10-CM | POA: Diagnosis not present

## 2019-02-20 DIAGNOSIS — Z992 Dependence on renal dialysis: Secondary | ICD-10-CM | POA: Diagnosis not present

## 2019-02-20 DIAGNOSIS — R17 Unspecified jaundice: Secondary | ICD-10-CM | POA: Diagnosis not present

## 2019-02-20 DIAGNOSIS — N2581 Secondary hyperparathyroidism of renal origin: Secondary | ICD-10-CM | POA: Diagnosis not present

## 2019-02-20 DIAGNOSIS — D631 Anemia in chronic kidney disease: Secondary | ICD-10-CM | POA: Diagnosis not present

## 2019-02-20 DIAGNOSIS — N186 End stage renal disease: Secondary | ICD-10-CM | POA: Diagnosis not present

## 2019-02-20 DIAGNOSIS — E875 Hyperkalemia: Secondary | ICD-10-CM | POA: Diagnosis not present

## 2019-02-22 DIAGNOSIS — N186 End stage renal disease: Secondary | ICD-10-CM | POA: Diagnosis not present

## 2019-02-22 DIAGNOSIS — R17 Unspecified jaundice: Secondary | ICD-10-CM | POA: Diagnosis not present

## 2019-02-22 DIAGNOSIS — D631 Anemia in chronic kidney disease: Secondary | ICD-10-CM | POA: Diagnosis not present

## 2019-02-22 DIAGNOSIS — Z992 Dependence on renal dialysis: Secondary | ICD-10-CM | POA: Diagnosis not present

## 2019-02-22 DIAGNOSIS — E875 Hyperkalemia: Secondary | ICD-10-CM | POA: Diagnosis not present

## 2019-02-22 DIAGNOSIS — N2581 Secondary hyperparathyroidism of renal origin: Secondary | ICD-10-CM | POA: Diagnosis not present

## 2019-02-24 DIAGNOSIS — Z992 Dependence on renal dialysis: Secondary | ICD-10-CM | POA: Diagnosis not present

## 2019-02-24 DIAGNOSIS — N186 End stage renal disease: Secondary | ICD-10-CM | POA: Diagnosis not present

## 2019-02-24 DIAGNOSIS — N2581 Secondary hyperparathyroidism of renal origin: Secondary | ICD-10-CM | POA: Diagnosis not present

## 2019-02-24 DIAGNOSIS — D631 Anemia in chronic kidney disease: Secondary | ICD-10-CM | POA: Diagnosis not present

## 2019-02-24 DIAGNOSIS — E875 Hyperkalemia: Secondary | ICD-10-CM | POA: Diagnosis not present

## 2019-02-24 DIAGNOSIS — R17 Unspecified jaundice: Secondary | ICD-10-CM | POA: Diagnosis not present

## 2019-02-25 DIAGNOSIS — D631 Anemia in chronic kidney disease: Secondary | ICD-10-CM | POA: Diagnosis not present

## 2019-02-25 DIAGNOSIS — N186 End stage renal disease: Secondary | ICD-10-CM | POA: Diagnosis not present

## 2019-02-25 DIAGNOSIS — N2581 Secondary hyperparathyroidism of renal origin: Secondary | ICD-10-CM | POA: Diagnosis not present

## 2019-02-25 DIAGNOSIS — Z992 Dependence on renal dialysis: Secondary | ICD-10-CM | POA: Diagnosis not present

## 2019-02-25 DIAGNOSIS — R17 Unspecified jaundice: Secondary | ICD-10-CM | POA: Diagnosis not present

## 2019-02-25 DIAGNOSIS — E875 Hyperkalemia: Secondary | ICD-10-CM | POA: Diagnosis not present

## 2019-02-27 DIAGNOSIS — N2581 Secondary hyperparathyroidism of renal origin: Secondary | ICD-10-CM | POA: Diagnosis not present

## 2019-02-27 DIAGNOSIS — Z992 Dependence on renal dialysis: Secondary | ICD-10-CM | POA: Diagnosis not present

## 2019-02-27 DIAGNOSIS — E875 Hyperkalemia: Secondary | ICD-10-CM | POA: Diagnosis not present

## 2019-02-27 DIAGNOSIS — R17 Unspecified jaundice: Secondary | ICD-10-CM | POA: Diagnosis not present

## 2019-02-27 DIAGNOSIS — D631 Anemia in chronic kidney disease: Secondary | ICD-10-CM | POA: Diagnosis not present

## 2019-02-27 DIAGNOSIS — N186 End stage renal disease: Secondary | ICD-10-CM | POA: Diagnosis not present

## 2019-03-01 DIAGNOSIS — N2581 Secondary hyperparathyroidism of renal origin: Secondary | ICD-10-CM | POA: Diagnosis not present

## 2019-03-01 DIAGNOSIS — E875 Hyperkalemia: Secondary | ICD-10-CM | POA: Diagnosis not present

## 2019-03-01 DIAGNOSIS — D631 Anemia in chronic kidney disease: Secondary | ICD-10-CM | POA: Diagnosis not present

## 2019-03-01 DIAGNOSIS — R17 Unspecified jaundice: Secondary | ICD-10-CM | POA: Diagnosis not present

## 2019-03-01 DIAGNOSIS — N186 End stage renal disease: Secondary | ICD-10-CM | POA: Diagnosis not present

## 2019-03-01 DIAGNOSIS — Z992 Dependence on renal dialysis: Secondary | ICD-10-CM | POA: Diagnosis not present

## 2019-03-03 DIAGNOSIS — E875 Hyperkalemia: Secondary | ICD-10-CM | POA: Diagnosis not present

## 2019-03-03 DIAGNOSIS — D631 Anemia in chronic kidney disease: Secondary | ICD-10-CM | POA: Diagnosis not present

## 2019-03-03 DIAGNOSIS — N2581 Secondary hyperparathyroidism of renal origin: Secondary | ICD-10-CM | POA: Diagnosis not present

## 2019-03-03 DIAGNOSIS — R17 Unspecified jaundice: Secondary | ICD-10-CM | POA: Diagnosis not present

## 2019-03-03 DIAGNOSIS — N186 End stage renal disease: Secondary | ICD-10-CM | POA: Diagnosis not present

## 2019-03-03 DIAGNOSIS — Z992 Dependence on renal dialysis: Secondary | ICD-10-CM | POA: Diagnosis not present

## 2019-03-04 DIAGNOSIS — R17 Unspecified jaundice: Secondary | ICD-10-CM | POA: Diagnosis not present

## 2019-03-04 DIAGNOSIS — E875 Hyperkalemia: Secondary | ICD-10-CM | POA: Diagnosis not present

## 2019-03-04 DIAGNOSIS — D631 Anemia in chronic kidney disease: Secondary | ICD-10-CM | POA: Diagnosis not present

## 2019-03-04 DIAGNOSIS — N186 End stage renal disease: Secondary | ICD-10-CM | POA: Diagnosis not present

## 2019-03-04 DIAGNOSIS — N2581 Secondary hyperparathyroidism of renal origin: Secondary | ICD-10-CM | POA: Diagnosis not present

## 2019-03-04 DIAGNOSIS — Z992 Dependence on renal dialysis: Secondary | ICD-10-CM | POA: Diagnosis not present

## 2019-03-06 DIAGNOSIS — Z992 Dependence on renal dialysis: Secondary | ICD-10-CM | POA: Diagnosis not present

## 2019-03-06 DIAGNOSIS — N186 End stage renal disease: Secondary | ICD-10-CM | POA: Diagnosis not present

## 2019-03-06 DIAGNOSIS — R17 Unspecified jaundice: Secondary | ICD-10-CM | POA: Diagnosis not present

## 2019-03-06 DIAGNOSIS — N2581 Secondary hyperparathyroidism of renal origin: Secondary | ICD-10-CM | POA: Diagnosis not present

## 2019-03-06 DIAGNOSIS — D631 Anemia in chronic kidney disease: Secondary | ICD-10-CM | POA: Diagnosis not present

## 2019-03-06 DIAGNOSIS — E875 Hyperkalemia: Secondary | ICD-10-CM | POA: Diagnosis not present

## 2019-03-07 DIAGNOSIS — N041 Nephrotic syndrome with focal and segmental glomerular lesions: Secondary | ICD-10-CM | POA: Diagnosis not present

## 2019-03-07 DIAGNOSIS — Z992 Dependence on renal dialysis: Secondary | ICD-10-CM | POA: Diagnosis not present

## 2019-03-07 DIAGNOSIS — N186 End stage renal disease: Secondary | ICD-10-CM | POA: Diagnosis not present

## 2019-03-08 DIAGNOSIS — E440 Moderate protein-calorie malnutrition: Secondary | ICD-10-CM | POA: Diagnosis not present

## 2019-03-08 DIAGNOSIS — Z4931 Encounter for adequacy testing for hemodialysis: Secondary | ICD-10-CM | POA: Diagnosis not present

## 2019-03-08 DIAGNOSIS — R17 Unspecified jaundice: Secondary | ICD-10-CM | POA: Diagnosis not present

## 2019-03-08 DIAGNOSIS — E875 Hyperkalemia: Secondary | ICD-10-CM | POA: Diagnosis not present

## 2019-03-08 DIAGNOSIS — Z5189 Encounter for other specified aftercare: Secondary | ICD-10-CM | POA: Diagnosis not present

## 2019-03-08 DIAGNOSIS — D509 Iron deficiency anemia, unspecified: Secondary | ICD-10-CM | POA: Diagnosis not present

## 2019-03-08 DIAGNOSIS — E8770 Fluid overload, unspecified: Secondary | ICD-10-CM | POA: Diagnosis not present

## 2019-03-08 DIAGNOSIS — N2581 Secondary hyperparathyroidism of renal origin: Secondary | ICD-10-CM | POA: Diagnosis not present

## 2019-03-08 DIAGNOSIS — N186 End stage renal disease: Secondary | ICD-10-CM | POA: Diagnosis not present

## 2019-03-08 DIAGNOSIS — Z79899 Other long term (current) drug therapy: Secondary | ICD-10-CM | POA: Diagnosis not present

## 2019-03-08 DIAGNOSIS — Z992 Dependence on renal dialysis: Secondary | ICD-10-CM | POA: Diagnosis not present

## 2019-03-10 DIAGNOSIS — N2581 Secondary hyperparathyroidism of renal origin: Secondary | ICD-10-CM | POA: Diagnosis not present

## 2019-03-10 DIAGNOSIS — N186 End stage renal disease: Secondary | ICD-10-CM | POA: Diagnosis not present

## 2019-03-10 DIAGNOSIS — Z992 Dependence on renal dialysis: Secondary | ICD-10-CM | POA: Diagnosis not present

## 2019-03-10 DIAGNOSIS — D509 Iron deficiency anemia, unspecified: Secondary | ICD-10-CM | POA: Diagnosis not present

## 2019-03-11 DIAGNOSIS — N2581 Secondary hyperparathyroidism of renal origin: Secondary | ICD-10-CM | POA: Diagnosis not present

## 2019-03-11 DIAGNOSIS — D509 Iron deficiency anemia, unspecified: Secondary | ICD-10-CM | POA: Diagnosis not present

## 2019-03-11 DIAGNOSIS — N186 End stage renal disease: Secondary | ICD-10-CM | POA: Diagnosis not present

## 2019-03-11 DIAGNOSIS — Z992 Dependence on renal dialysis: Secondary | ICD-10-CM | POA: Diagnosis not present

## 2019-03-13 DIAGNOSIS — Z992 Dependence on renal dialysis: Secondary | ICD-10-CM | POA: Diagnosis not present

## 2019-03-13 DIAGNOSIS — N186 End stage renal disease: Secondary | ICD-10-CM | POA: Diagnosis not present

## 2019-03-13 DIAGNOSIS — E890 Postprocedural hypothyroidism: Secondary | ICD-10-CM | POA: Diagnosis not present

## 2019-03-13 DIAGNOSIS — D509 Iron deficiency anemia, unspecified: Secondary | ICD-10-CM | POA: Diagnosis not present

## 2019-03-13 DIAGNOSIS — N2581 Secondary hyperparathyroidism of renal origin: Secondary | ICD-10-CM | POA: Diagnosis not present

## 2019-03-13 DIAGNOSIS — E790 Hyperuricemia without signs of inflammatory arthritis and tophaceous disease: Secondary | ICD-10-CM | POA: Diagnosis not present

## 2019-03-14 DIAGNOSIS — D509 Iron deficiency anemia, unspecified: Secondary | ICD-10-CM | POA: Diagnosis not present

## 2019-03-14 DIAGNOSIS — N186 End stage renal disease: Secondary | ICD-10-CM | POA: Diagnosis not present

## 2019-03-14 DIAGNOSIS — N2581 Secondary hyperparathyroidism of renal origin: Secondary | ICD-10-CM | POA: Diagnosis not present

## 2019-03-14 DIAGNOSIS — Z992 Dependence on renal dialysis: Secondary | ICD-10-CM | POA: Diagnosis not present

## 2019-03-15 DIAGNOSIS — D509 Iron deficiency anemia, unspecified: Secondary | ICD-10-CM | POA: Diagnosis not present

## 2019-03-15 DIAGNOSIS — Z992 Dependence on renal dialysis: Secondary | ICD-10-CM | POA: Diagnosis not present

## 2019-03-15 DIAGNOSIS — N186 End stage renal disease: Secondary | ICD-10-CM | POA: Diagnosis not present

## 2019-03-15 DIAGNOSIS — N2581 Secondary hyperparathyroidism of renal origin: Secondary | ICD-10-CM | POA: Diagnosis not present

## 2019-03-17 DIAGNOSIS — N186 End stage renal disease: Secondary | ICD-10-CM | POA: Diagnosis not present

## 2019-03-17 DIAGNOSIS — D509 Iron deficiency anemia, unspecified: Secondary | ICD-10-CM | POA: Diagnosis not present

## 2019-03-17 DIAGNOSIS — N2581 Secondary hyperparathyroidism of renal origin: Secondary | ICD-10-CM | POA: Diagnosis not present

## 2019-03-17 DIAGNOSIS — Z992 Dependence on renal dialysis: Secondary | ICD-10-CM | POA: Diagnosis not present

## 2019-03-18 DIAGNOSIS — D509 Iron deficiency anemia, unspecified: Secondary | ICD-10-CM | POA: Diagnosis not present

## 2019-03-18 DIAGNOSIS — N186 End stage renal disease: Secondary | ICD-10-CM | POA: Diagnosis not present

## 2019-03-18 DIAGNOSIS — Z992 Dependence on renal dialysis: Secondary | ICD-10-CM | POA: Diagnosis not present

## 2019-03-18 DIAGNOSIS — N2581 Secondary hyperparathyroidism of renal origin: Secondary | ICD-10-CM | POA: Diagnosis not present

## 2019-03-20 DIAGNOSIS — N2581 Secondary hyperparathyroidism of renal origin: Secondary | ICD-10-CM | POA: Diagnosis not present

## 2019-03-20 DIAGNOSIS — D509 Iron deficiency anemia, unspecified: Secondary | ICD-10-CM | POA: Diagnosis not present

## 2019-03-20 DIAGNOSIS — N186 End stage renal disease: Secondary | ICD-10-CM | POA: Diagnosis not present

## 2019-03-20 DIAGNOSIS — Z992 Dependence on renal dialysis: Secondary | ICD-10-CM | POA: Diagnosis not present

## 2019-03-21 ENCOUNTER — Telehealth: Payer: Self-pay | Admitting: *Deleted

## 2019-03-21 NOTE — Telephone Encounter (Signed)
Pt call about needing an appt with Janett Billow NP to discuss her left knee giving out. She states when its about to give out or freeze shaking occurs. PT stated this is the side the stroke affected.Pt schedule for 9/16 745 and check in at 0715am.

## 2019-03-21 NOTE — Telephone Encounter (Signed)
LMVM for pt to return call.   

## 2019-03-21 NOTE — Telephone Encounter (Signed)
Patient stopped by the office to request an appointment.  She is having a new symptom and would like a call back

## 2019-03-22 ENCOUNTER — Encounter: Payer: Self-pay | Admitting: Adult Health

## 2019-03-22 ENCOUNTER — Other Ambulatory Visit: Payer: Self-pay

## 2019-03-22 ENCOUNTER — Ambulatory Visit (INDEPENDENT_AMBULATORY_CARE_PROVIDER_SITE_OTHER): Payer: Medicare Other | Admitting: Adult Health

## 2019-03-22 VITALS — BP 166/94 | HR 68 | Temp 96.4°F | Ht 65.0 in | Wt 142.0 lb

## 2019-03-22 DIAGNOSIS — R569 Unspecified convulsions: Secondary | ICD-10-CM

## 2019-03-22 DIAGNOSIS — I629 Nontraumatic intracranial hemorrhage, unspecified: Secondary | ICD-10-CM

## 2019-03-22 DIAGNOSIS — R269 Unspecified abnormalities of gait and mobility: Secondary | ICD-10-CM | POA: Diagnosis not present

## 2019-03-22 DIAGNOSIS — M25362 Other instability, left knee: Secondary | ICD-10-CM

## 2019-03-22 DIAGNOSIS — G8194 Hemiplegia, unspecified affecting left nondominant side: Secondary | ICD-10-CM

## 2019-03-22 DIAGNOSIS — E785 Hyperlipidemia, unspecified: Secondary | ICD-10-CM | POA: Diagnosis not present

## 2019-03-22 DIAGNOSIS — Z992 Dependence on renal dialysis: Secondary | ICD-10-CM | POA: Diagnosis not present

## 2019-03-22 DIAGNOSIS — I151 Hypertension secondary to other renal disorders: Secondary | ICD-10-CM

## 2019-03-22 DIAGNOSIS — N186 End stage renal disease: Secondary | ICD-10-CM | POA: Diagnosis not present

## 2019-03-22 DIAGNOSIS — N2581 Secondary hyperparathyroidism of renal origin: Secondary | ICD-10-CM | POA: Diagnosis not present

## 2019-03-22 DIAGNOSIS — N2889 Other specified disorders of kidney and ureter: Secondary | ICD-10-CM | POA: Diagnosis not present

## 2019-03-22 DIAGNOSIS — D509 Iron deficiency anemia, unspecified: Secondary | ICD-10-CM | POA: Diagnosis not present

## 2019-03-22 NOTE — Patient Instructions (Signed)
Your Plan:  Will do EMG/NCV to assess for potential underlying causes of your left leg symptoms  Referral placed for outpatient physical therapy   Continue aspirin 81mg  and lipitor for secondary stroke prevention  Continue keppra 500mg  twice daily for seizure prevention   Follow up 2 months     Thank you for coming to see [place] at [facility]. I hope we have been able to provide you high quality care today.  You may receive a patient satisfaction survey over the next few weeks. We would appreciate your feedback and comments so that we may continue to improve ourselves and the health of our patients.

## 2019-03-22 NOTE — Progress Notes (Signed)
I agree with the above plan 

## 2019-03-22 NOTE — Progress Notes (Signed)
STROKE NEUROLOGY FOLLOW UP NOTE  NAME: Terry Juarez DOB: Dec 12, 1965  REASON FOR VISIT: stroke follow up HISTORY FROM: pt and chart    Chief Complaint  Patient presents with   Follow-up    Alone. Rm 9. shaking when L knee goes out. has been going on about a month. It was just every once in awhile but it is everyday almost all day     History Summary Terry Juarez is a 53 y.o. female with history of a renal cell carcinoma s/p right nephrectomy, hypertension, end-stage renal disease on peritoneal dialysis, hyperlipidemia, and anemia admitted on 12/19/16 for seizure, left-sided weakness, and right frontal headache. CT head showed right posterior frontal lobe acute ICH. MRI head consistent with PRES. CTA unremarkable except FMD of bilateral cervical ICAs. Repeat head CT with stable hematoma. EF 60-65%. EEG diffuse slowing, no seizure activity, put on Keppra. BP eventually controlled with multiple PO meds. Attempted twice for HD, however, patient not able to tolerate, the second attempt even caused cardiac arrest s/p intubation. Switched back to PD. Pt condition gradually improved, but still has left hemiplegia. Discharge to CIR.  04/06/17 follow up Terry Juarez - the patient has been doing well. Left hemiplegia much improved. Currently, patient able to walk with cane with left PFO brace. Still has left arm plegia. BP much improved, today 130/86, still on Keppra, no seizures. However, patient has been back to driving although less than 6 months of last seizure. She was told to restrain from driving until 6 months free from seizure. She expressed understanding and willing to do so. Has outpt PT/OT  07/27/2017 visit JX: During the interval time, pt has been doing well.  Follow with PMR, had a Botox at left upper extremity.  Currently left upper and lower extremity further improved strength.  Not driving now due to possible seizure in 12/2016 during hospitalization.  Not continued outpatient PT/OT due to  no transportation at this time.  Continued PD at home, blood pressure was low about 2 weeks ago at 80-90s.  About the same time, patient had 2 episode of whole body tremor for 5 minutes each, did not lose consciousness, resolved after laying in bed.  Episode concerning for related to low blood pressure.  Currently blood pressure improved, on the high side, nephrologist is managing the BP meds.  Today in clinic BP 148/97.  Continued on Keppra without side effects.  Update 07/04/2018: Patient is being seen today for follow-up visit.  She has complaints of for approximately the last month she has been experiencing right hand numbness and tingling intermittently.  She is right-hand dominant.  She states it is present in the palm of her hand and all 5 fingers.  She denies the symptoms in the past.  Denies any numbness occurring past her wrist, face or right leg.  Denies any weakness.  Denies any headaches.  Denies any other neurological concern.  Symptoms will resolve on their own.  She also has concerns of whole body tremors that lasts for 5 to 10 minutes and occurs a couple times a month.  This has been present since she initially presented with seizures but it was felt as though these could be related to her low blood pressure.  She states blood pressures have been stable at home and has not had any recent hypotension episodes.  She has recently stopped PD day the PD catheter removed on 06/01/2018 and restarted using prior fistula and does hemodialysis treatments at home 4  times weekly.  She has continued on Keppra 500 mg twice daily.  She does state that these events typically occur in the evening time.  She denies any loss of consciousness during these episodes.  Blood pressure today 153/88.  She does continue to follow with nephrology for BP management.  She has continued on aspirin 81 mg without side effects of bleeding or bruising.  Continues on atorvastatin without side effects myalgias.  No further concerns  at this time.  She continues to use cane for ambulation assistance due to left hemiparesis post stroke.  Denies new or worsening stroke/TIA symptoms.  Update 03/22/2019: Terry Juarez is being seen today per patient request due to increased difficulty with left knee giving out with prior left hemiparesis post stroke.  She states she will have knee buckling occasionally previously but over the past month, this has been worsening where she has been experiencing left knee "freezing up"/stiffens, starts tremoring/spasms and then will give out.  Symptoms have been occurring multiple times throughout the day and can occur while standing or even sitting.  She denies any pain associated.  She denies any trauma prior to worsening of symptoms or any falls related to symptoms.  She has continued ambulating with a cane but more recently due to fear of falling, she has been limiting mobility and currently in a wheelchair today's visit.  She continues on Keppra 500 mg twice daily without recurrent seizure activity.  Continues on aspirin and Crestor for secondary stroke prevention without side effects.  Blood pressure today mildly elevated at 166/94 due to increased anxiety with worsening of symptoms.  No further concerns at this time.     REVIEW OF SYSTEMS: Full 14 system review of systems performed and notable only for those listed below and in HPI above, all others are negative:  Weakness and ambulation difficulty  The following represents the patient's updated allergies and side effects list: Allergies  Allergen Reactions   Lisinopril Cough    The neurologically relevant items on the patient's problem list were reviewed on today's visit.  Neurologic Examination   Blood pressure (!) 166/94, pulse 68, temperature (!) 96.4 F (35.8 C), height 5\' 5"  (1.651 m), weight 142 lb (64.4 kg).  General - Well nourished, well developed, frail middle-aged pleasant African-American female, in no apparent  distress.  Ophthalmologic -visual fields intact.  No evidence of nystagmus..  Cardiovascular - Regular rate and rhythm with no murmur.  Mental Status -  Level of arousal and orientation to time, place, and person were intact. Language including expression, naming, repetition, comprehension was assessed and found intact. Attention span and concentration were normal. Fund of Knowledge was assessed and was intact.  Cranial Nerves II - XII - II - Visual field intact OU. III, IV, VI - Extraocular movements intact. V - Facial sensation intact bilaterally. VII - left nasolabial fold flattening. VIII - Hearing & vestibular intact bilaterally. X - Palate elevates symmetrically. XI - Chin turning & shoulder shrug intact bilaterally. XII - Tongue protrusion intact.  Motor Strength - The patients strength was normal in RUE and RLE, however, LUE 3-/5 proximal and 2/5 distal with increased tone. LLE 5/5 proximal and 4/5 distal. Bulk was normal and fasciculations were absent.   Motor Tone - Muscle tone was assessed at the neck and appendages and was increased on the left  Reflexes - The patients reflexes were 2+ in LUE and LLE and she had no pathological reflexes.  Sensory - Light touch, temperature/pinprick were assessed  and were normal.    Coordination - The patient had normal movements in the right hand with no ataxia or dysmetria.  Tremor was absent.  Gait and Station - gait assessment deferred        Assessment: Terry Juarez is a 53 year old female with right frontal lobe acute ICH with SAH on 12/19/2016 secondary to hypertension with increase in hemorrhage size and MRI evidence of PRES. she also presented with seizure activity at that time and was placed on Keppra.  Vascular risk factors include renal cell carcinoma, HTN, end-stage renal disease on PD, HLD and anemia.  Residual deficits of left hemiparesis.  She is being seen today per patient request due to increased episodes of left  knee buckling causing increased gait difficulty   Plan:  -Compared to prior exam, prior LLE weakness improved with very mild remaining weakness.  Sensation equal.  Recommend undergoing EMG/NCV to look for possible underlying abnormalities.  Could also be residual stroke deficit with increasing spasticity which was discussed with patient.  Referral placed for outpatient PT for improvement of gait.  - continue aspirin 81 mg and Lipitor for secondary stroke prevention - continue keppra 500 mg twice daily for seizure prophylaxis - check BP at home and record - continue HD at home and follow up with nephrology for BP management -Maintain strict control of hypertension with blood pressure goal below 130/90, diabetes with hemoglobin A1c goal below 6.5% and cholesterol with LDL cholesterol (bad cholesterol) goal below 70 mg/dL. I also advised the patient to eat a healthy diet with plenty of whole grains, cereals, fruits and vegetables, exercise regularly and maintain ideal body weight.  Followup in the future with me in 2 months or call earlier if needed  I spent more than 25 minutes of face to face time with the patient. Greater than 50% of time was spent in counseling and coordination of care along with discussion regarding new onset of symptoms.  We discussed post stroke deficits with potential increase of spasticity versus potential underlying etiology, ongoing use of Keppra for seizure prophylaxis and continued management of stroke risk factors.  Frann Rider, AGNP-BC  [facility] [address] [place], [place] [address]  Phone [phone] Fax [phone] Note: This document was prepared with digital dictation and possible smart phrase technology. Any transcriptional errors that result from this process are unintentional.

## 2019-03-24 DIAGNOSIS — N2581 Secondary hyperparathyroidism of renal origin: Secondary | ICD-10-CM | POA: Diagnosis not present

## 2019-03-24 DIAGNOSIS — D509 Iron deficiency anemia, unspecified: Secondary | ICD-10-CM | POA: Diagnosis not present

## 2019-03-24 DIAGNOSIS — Z992 Dependence on renal dialysis: Secondary | ICD-10-CM | POA: Diagnosis not present

## 2019-03-24 DIAGNOSIS — N186 End stage renal disease: Secondary | ICD-10-CM | POA: Diagnosis not present

## 2019-03-25 DIAGNOSIS — D509 Iron deficiency anemia, unspecified: Secondary | ICD-10-CM | POA: Diagnosis not present

## 2019-03-25 DIAGNOSIS — Z992 Dependence on renal dialysis: Secondary | ICD-10-CM | POA: Diagnosis not present

## 2019-03-25 DIAGNOSIS — N186 End stage renal disease: Secondary | ICD-10-CM | POA: Diagnosis not present

## 2019-03-25 DIAGNOSIS — N2581 Secondary hyperparathyroidism of renal origin: Secondary | ICD-10-CM | POA: Diagnosis not present

## 2019-03-27 DIAGNOSIS — N2581 Secondary hyperparathyroidism of renal origin: Secondary | ICD-10-CM | POA: Diagnosis not present

## 2019-03-27 DIAGNOSIS — Z992 Dependence on renal dialysis: Secondary | ICD-10-CM | POA: Diagnosis not present

## 2019-03-27 DIAGNOSIS — D509 Iron deficiency anemia, unspecified: Secondary | ICD-10-CM | POA: Diagnosis not present

## 2019-03-27 DIAGNOSIS — N186 End stage renal disease: Secondary | ICD-10-CM | POA: Diagnosis not present

## 2019-03-28 ENCOUNTER — Encounter: Payer: Self-pay | Admitting: Podiatry

## 2019-03-28 ENCOUNTER — Other Ambulatory Visit: Payer: Self-pay

## 2019-03-28 ENCOUNTER — Ambulatory Visit (INDEPENDENT_AMBULATORY_CARE_PROVIDER_SITE_OTHER): Payer: Medicare Other | Admitting: Podiatry

## 2019-03-28 DIAGNOSIS — M79675 Pain in left toe(s): Secondary | ICD-10-CM | POA: Diagnosis not present

## 2019-03-28 DIAGNOSIS — L608 Other nail disorders: Secondary | ICD-10-CM

## 2019-03-28 DIAGNOSIS — B351 Tinea unguium: Secondary | ICD-10-CM | POA: Diagnosis not present

## 2019-03-28 DIAGNOSIS — M79674 Pain in right toe(s): Secondary | ICD-10-CM | POA: Diagnosis not present

## 2019-03-28 NOTE — Progress Notes (Signed)
Complaint:  Visit Type: Patient presents  to my office for  preventative foot care services. Complaint: Patient states" my nails have grown long and thick and become painful to walk and wear shoes" Patient has been diagnosed with CVA and ESRD. The patient presents for preventative foot care services. No changes to ROS  Podiatric Exam: Vascular: dorsalis pedis  are palpable bilateral.  Posterior tibial pulses are absent  B/L. Capillary return is immediate. Temperature gradient is WNL. Skin turgor WNL  Sensorium: Normal Semmes Weinstein monofilament test. Normal tactile sensation bilaterally. Nail Exam: Pt has thick disfigured discolored nails with subungual debris noted bilateral entire nail hallux through fifth toenails Ulcer Exam: There is no evidence of ulcer or pre-ulcerative changes or infection. Orthopedic Exam: Muscle tone and strength are WNL. No limitations in general ROM. No crepitus or effusions noted. Foot type and digits show no abnormalities. Bony prominences are unremarkable. Skin: No Porokeratosis. No infection or ulcers  Diagnosis:  Onychomycosis, , Pain in right toe, pain in left toes  Treatment & Plan Procedures and Treatment: Consent by patient was obtained for treatment procedures.   Debridement of mycotic and hypertrophic toenails, 1 through 5 bilateral and clearing of subungual debris. No ulceration, no infection noted.  Return Visit-Office Procedure: Patient instructed to return to the office for a follow up visit 3 months for continued evaluation and treatment.    Terry Juarez DPM 

## 2019-03-29 DIAGNOSIS — N2581 Secondary hyperparathyroidism of renal origin: Secondary | ICD-10-CM | POA: Diagnosis not present

## 2019-03-29 DIAGNOSIS — N186 End stage renal disease: Secondary | ICD-10-CM | POA: Diagnosis not present

## 2019-03-29 DIAGNOSIS — D509 Iron deficiency anemia, unspecified: Secondary | ICD-10-CM | POA: Diagnosis not present

## 2019-03-29 DIAGNOSIS — Z992 Dependence on renal dialysis: Secondary | ICD-10-CM | POA: Diagnosis not present

## 2019-03-31 DIAGNOSIS — Z992 Dependence on renal dialysis: Secondary | ICD-10-CM | POA: Diagnosis not present

## 2019-03-31 DIAGNOSIS — D509 Iron deficiency anemia, unspecified: Secondary | ICD-10-CM | POA: Diagnosis not present

## 2019-03-31 DIAGNOSIS — N2581 Secondary hyperparathyroidism of renal origin: Secondary | ICD-10-CM | POA: Diagnosis not present

## 2019-03-31 DIAGNOSIS — N186 End stage renal disease: Secondary | ICD-10-CM | POA: Diagnosis not present

## 2019-04-01 DIAGNOSIS — Z992 Dependence on renal dialysis: Secondary | ICD-10-CM | POA: Diagnosis not present

## 2019-04-01 DIAGNOSIS — N2581 Secondary hyperparathyroidism of renal origin: Secondary | ICD-10-CM | POA: Diagnosis not present

## 2019-04-01 DIAGNOSIS — D509 Iron deficiency anemia, unspecified: Secondary | ICD-10-CM | POA: Diagnosis not present

## 2019-04-01 DIAGNOSIS — N186 End stage renal disease: Secondary | ICD-10-CM | POA: Diagnosis not present

## 2019-04-03 DIAGNOSIS — D509 Iron deficiency anemia, unspecified: Secondary | ICD-10-CM | POA: Diagnosis not present

## 2019-04-03 DIAGNOSIS — Z992 Dependence on renal dialysis: Secondary | ICD-10-CM | POA: Diagnosis not present

## 2019-04-03 DIAGNOSIS — N186 End stage renal disease: Secondary | ICD-10-CM | POA: Diagnosis not present

## 2019-04-03 DIAGNOSIS — N2581 Secondary hyperparathyroidism of renal origin: Secondary | ICD-10-CM | POA: Diagnosis not present

## 2019-04-05 DIAGNOSIS — N186 End stage renal disease: Secondary | ICD-10-CM | POA: Diagnosis not present

## 2019-04-05 DIAGNOSIS — N2581 Secondary hyperparathyroidism of renal origin: Secondary | ICD-10-CM | POA: Diagnosis not present

## 2019-04-05 DIAGNOSIS — Z992 Dependence on renal dialysis: Secondary | ICD-10-CM | POA: Diagnosis not present

## 2019-04-05 DIAGNOSIS — D509 Iron deficiency anemia, unspecified: Secondary | ICD-10-CM | POA: Diagnosis not present

## 2019-04-06 DIAGNOSIS — N041 Nephrotic syndrome with focal and segmental glomerular lesions: Secondary | ICD-10-CM | POA: Diagnosis not present

## 2019-04-06 DIAGNOSIS — N186 End stage renal disease: Secondary | ICD-10-CM | POA: Diagnosis not present

## 2019-04-06 DIAGNOSIS — Z992 Dependence on renal dialysis: Secondary | ICD-10-CM | POA: Diagnosis not present

## 2019-04-07 DIAGNOSIS — N2581 Secondary hyperparathyroidism of renal origin: Secondary | ICD-10-CM | POA: Diagnosis not present

## 2019-04-07 DIAGNOSIS — Z23 Encounter for immunization: Secondary | ICD-10-CM | POA: Diagnosis not present

## 2019-04-07 DIAGNOSIS — D509 Iron deficiency anemia, unspecified: Secondary | ICD-10-CM | POA: Diagnosis not present

## 2019-04-07 DIAGNOSIS — R17 Unspecified jaundice: Secondary | ICD-10-CM | POA: Diagnosis not present

## 2019-04-07 DIAGNOSIS — N2589 Other disorders resulting from impaired renal tubular function: Secondary | ICD-10-CM | POA: Diagnosis not present

## 2019-04-07 DIAGNOSIS — E440 Moderate protein-calorie malnutrition: Secondary | ICD-10-CM | POA: Diagnosis not present

## 2019-04-07 DIAGNOSIS — E875 Hyperkalemia: Secondary | ICD-10-CM | POA: Diagnosis not present

## 2019-04-07 DIAGNOSIS — Z5189 Encounter for other specified aftercare: Secondary | ICD-10-CM | POA: Diagnosis not present

## 2019-04-07 DIAGNOSIS — Z992 Dependence on renal dialysis: Secondary | ICD-10-CM | POA: Diagnosis not present

## 2019-04-07 DIAGNOSIS — D631 Anemia in chronic kidney disease: Secondary | ICD-10-CM | POA: Diagnosis not present

## 2019-04-07 DIAGNOSIS — Z79899 Other long term (current) drug therapy: Secondary | ICD-10-CM | POA: Diagnosis not present

## 2019-04-07 DIAGNOSIS — E8770 Fluid overload, unspecified: Secondary | ICD-10-CM | POA: Diagnosis not present

## 2019-04-07 DIAGNOSIS — N186 End stage renal disease: Secondary | ICD-10-CM | POA: Diagnosis not present

## 2019-04-08 DIAGNOSIS — N186 End stage renal disease: Secondary | ICD-10-CM | POA: Diagnosis not present

## 2019-04-08 DIAGNOSIS — N2581 Secondary hyperparathyroidism of renal origin: Secondary | ICD-10-CM | POA: Diagnosis not present

## 2019-04-08 DIAGNOSIS — Z992 Dependence on renal dialysis: Secondary | ICD-10-CM | POA: Diagnosis not present

## 2019-04-08 DIAGNOSIS — D509 Iron deficiency anemia, unspecified: Secondary | ICD-10-CM | POA: Diagnosis not present

## 2019-04-08 DIAGNOSIS — D631 Anemia in chronic kidney disease: Secondary | ICD-10-CM | POA: Diagnosis not present

## 2019-04-08 DIAGNOSIS — E875 Hyperkalemia: Secondary | ICD-10-CM | POA: Diagnosis not present

## 2019-04-10 DIAGNOSIS — Z992 Dependence on renal dialysis: Secondary | ICD-10-CM | POA: Diagnosis not present

## 2019-04-10 DIAGNOSIS — D509 Iron deficiency anemia, unspecified: Secondary | ICD-10-CM | POA: Diagnosis not present

## 2019-04-10 DIAGNOSIS — E890 Postprocedural hypothyroidism: Secondary | ICD-10-CM | POA: Diagnosis not present

## 2019-04-10 DIAGNOSIS — D631 Anemia in chronic kidney disease: Secondary | ICD-10-CM | POA: Diagnosis not present

## 2019-04-10 DIAGNOSIS — E7849 Other hyperlipidemia: Secondary | ICD-10-CM | POA: Diagnosis not present

## 2019-04-10 DIAGNOSIS — N186 End stage renal disease: Secondary | ICD-10-CM | POA: Diagnosis not present

## 2019-04-10 DIAGNOSIS — E790 Hyperuricemia without signs of inflammatory arthritis and tophaceous disease: Secondary | ICD-10-CM | POA: Diagnosis not present

## 2019-04-10 DIAGNOSIS — E875 Hyperkalemia: Secondary | ICD-10-CM | POA: Diagnosis not present

## 2019-04-10 DIAGNOSIS — N2581 Secondary hyperparathyroidism of renal origin: Secondary | ICD-10-CM | POA: Diagnosis not present

## 2019-04-12 DIAGNOSIS — Z992 Dependence on renal dialysis: Secondary | ICD-10-CM | POA: Diagnosis not present

## 2019-04-12 DIAGNOSIS — E875 Hyperkalemia: Secondary | ICD-10-CM | POA: Diagnosis not present

## 2019-04-12 DIAGNOSIS — D631 Anemia in chronic kidney disease: Secondary | ICD-10-CM | POA: Diagnosis not present

## 2019-04-12 DIAGNOSIS — N2581 Secondary hyperparathyroidism of renal origin: Secondary | ICD-10-CM | POA: Diagnosis not present

## 2019-04-12 DIAGNOSIS — N186 End stage renal disease: Secondary | ICD-10-CM | POA: Diagnosis not present

## 2019-04-12 DIAGNOSIS — D509 Iron deficiency anemia, unspecified: Secondary | ICD-10-CM | POA: Diagnosis not present

## 2019-04-14 DIAGNOSIS — D631 Anemia in chronic kidney disease: Secondary | ICD-10-CM | POA: Diagnosis not present

## 2019-04-14 DIAGNOSIS — E875 Hyperkalemia: Secondary | ICD-10-CM | POA: Diagnosis not present

## 2019-04-14 DIAGNOSIS — D509 Iron deficiency anemia, unspecified: Secondary | ICD-10-CM | POA: Diagnosis not present

## 2019-04-14 DIAGNOSIS — N186 End stage renal disease: Secondary | ICD-10-CM | POA: Diagnosis not present

## 2019-04-14 DIAGNOSIS — N2581 Secondary hyperparathyroidism of renal origin: Secondary | ICD-10-CM | POA: Diagnosis not present

## 2019-04-14 DIAGNOSIS — Z992 Dependence on renal dialysis: Secondary | ICD-10-CM | POA: Diagnosis not present

## 2019-04-15 DIAGNOSIS — N2581 Secondary hyperparathyroidism of renal origin: Secondary | ICD-10-CM | POA: Diagnosis not present

## 2019-04-15 DIAGNOSIS — Z992 Dependence on renal dialysis: Secondary | ICD-10-CM | POA: Diagnosis not present

## 2019-04-15 DIAGNOSIS — D509 Iron deficiency anemia, unspecified: Secondary | ICD-10-CM | POA: Diagnosis not present

## 2019-04-15 DIAGNOSIS — N186 End stage renal disease: Secondary | ICD-10-CM | POA: Diagnosis not present

## 2019-04-15 DIAGNOSIS — E875 Hyperkalemia: Secondary | ICD-10-CM | POA: Diagnosis not present

## 2019-04-15 DIAGNOSIS — D631 Anemia in chronic kidney disease: Secondary | ICD-10-CM | POA: Diagnosis not present

## 2019-04-17 DIAGNOSIS — N2581 Secondary hyperparathyroidism of renal origin: Secondary | ICD-10-CM | POA: Diagnosis not present

## 2019-04-17 DIAGNOSIS — D509 Iron deficiency anemia, unspecified: Secondary | ICD-10-CM | POA: Diagnosis not present

## 2019-04-17 DIAGNOSIS — Z992 Dependence on renal dialysis: Secondary | ICD-10-CM | POA: Diagnosis not present

## 2019-04-17 DIAGNOSIS — E875 Hyperkalemia: Secondary | ICD-10-CM | POA: Diagnosis not present

## 2019-04-17 DIAGNOSIS — N186 End stage renal disease: Secondary | ICD-10-CM | POA: Diagnosis not present

## 2019-04-17 DIAGNOSIS — D631 Anemia in chronic kidney disease: Secondary | ICD-10-CM | POA: Diagnosis not present

## 2019-04-19 DIAGNOSIS — E875 Hyperkalemia: Secondary | ICD-10-CM | POA: Diagnosis not present

## 2019-04-19 DIAGNOSIS — Z992 Dependence on renal dialysis: Secondary | ICD-10-CM | POA: Diagnosis not present

## 2019-04-19 DIAGNOSIS — D509 Iron deficiency anemia, unspecified: Secondary | ICD-10-CM | POA: Diagnosis not present

## 2019-04-19 DIAGNOSIS — N186 End stage renal disease: Secondary | ICD-10-CM | POA: Diagnosis not present

## 2019-04-19 DIAGNOSIS — N2581 Secondary hyperparathyroidism of renal origin: Secondary | ICD-10-CM | POA: Diagnosis not present

## 2019-04-19 DIAGNOSIS — D631 Anemia in chronic kidney disease: Secondary | ICD-10-CM | POA: Diagnosis not present

## 2019-04-21 DIAGNOSIS — N2581 Secondary hyperparathyroidism of renal origin: Secondary | ICD-10-CM | POA: Diagnosis not present

## 2019-04-21 DIAGNOSIS — E875 Hyperkalemia: Secondary | ICD-10-CM | POA: Diagnosis not present

## 2019-04-21 DIAGNOSIS — N186 End stage renal disease: Secondary | ICD-10-CM | POA: Diagnosis not present

## 2019-04-21 DIAGNOSIS — Z992 Dependence on renal dialysis: Secondary | ICD-10-CM | POA: Diagnosis not present

## 2019-04-21 DIAGNOSIS — D509 Iron deficiency anemia, unspecified: Secondary | ICD-10-CM | POA: Diagnosis not present

## 2019-04-21 DIAGNOSIS — D631 Anemia in chronic kidney disease: Secondary | ICD-10-CM | POA: Diagnosis not present

## 2019-04-22 DIAGNOSIS — Z992 Dependence on renal dialysis: Secondary | ICD-10-CM | POA: Diagnosis not present

## 2019-04-22 DIAGNOSIS — E875 Hyperkalemia: Secondary | ICD-10-CM | POA: Diagnosis not present

## 2019-04-22 DIAGNOSIS — D631 Anemia in chronic kidney disease: Secondary | ICD-10-CM | POA: Diagnosis not present

## 2019-04-22 DIAGNOSIS — N186 End stage renal disease: Secondary | ICD-10-CM | POA: Diagnosis not present

## 2019-04-22 DIAGNOSIS — N2581 Secondary hyperparathyroidism of renal origin: Secondary | ICD-10-CM | POA: Diagnosis not present

## 2019-04-22 DIAGNOSIS — D509 Iron deficiency anemia, unspecified: Secondary | ICD-10-CM | POA: Diagnosis not present

## 2019-04-23 DIAGNOSIS — D631 Anemia in chronic kidney disease: Secondary | ICD-10-CM | POA: Diagnosis not present

## 2019-04-23 DIAGNOSIS — E875 Hyperkalemia: Secondary | ICD-10-CM | POA: Diagnosis not present

## 2019-04-23 DIAGNOSIS — N2581 Secondary hyperparathyroidism of renal origin: Secondary | ICD-10-CM | POA: Diagnosis not present

## 2019-04-23 DIAGNOSIS — D509 Iron deficiency anemia, unspecified: Secondary | ICD-10-CM | POA: Diagnosis not present

## 2019-04-23 DIAGNOSIS — N186 End stage renal disease: Secondary | ICD-10-CM | POA: Diagnosis not present

## 2019-04-23 DIAGNOSIS — Z992 Dependence on renal dialysis: Secondary | ICD-10-CM | POA: Diagnosis not present

## 2019-04-24 DIAGNOSIS — D509 Iron deficiency anemia, unspecified: Secondary | ICD-10-CM | POA: Diagnosis not present

## 2019-04-24 DIAGNOSIS — N186 End stage renal disease: Secondary | ICD-10-CM | POA: Diagnosis not present

## 2019-04-24 DIAGNOSIS — Z992 Dependence on renal dialysis: Secondary | ICD-10-CM | POA: Diagnosis not present

## 2019-04-24 DIAGNOSIS — N2581 Secondary hyperparathyroidism of renal origin: Secondary | ICD-10-CM | POA: Diagnosis not present

## 2019-04-24 DIAGNOSIS — E875 Hyperkalemia: Secondary | ICD-10-CM | POA: Diagnosis not present

## 2019-04-24 DIAGNOSIS — D631 Anemia in chronic kidney disease: Secondary | ICD-10-CM | POA: Diagnosis not present

## 2019-04-26 DIAGNOSIS — E875 Hyperkalemia: Secondary | ICD-10-CM | POA: Diagnosis not present

## 2019-04-26 DIAGNOSIS — D509 Iron deficiency anemia, unspecified: Secondary | ICD-10-CM | POA: Diagnosis not present

## 2019-04-26 DIAGNOSIS — D631 Anemia in chronic kidney disease: Secondary | ICD-10-CM | POA: Diagnosis not present

## 2019-04-26 DIAGNOSIS — N186 End stage renal disease: Secondary | ICD-10-CM | POA: Diagnosis not present

## 2019-04-26 DIAGNOSIS — Z992 Dependence on renal dialysis: Secondary | ICD-10-CM | POA: Diagnosis not present

## 2019-04-26 DIAGNOSIS — N2581 Secondary hyperparathyroidism of renal origin: Secondary | ICD-10-CM | POA: Diagnosis not present

## 2019-04-28 DIAGNOSIS — N186 End stage renal disease: Secondary | ICD-10-CM | POA: Diagnosis not present

## 2019-04-28 DIAGNOSIS — N2581 Secondary hyperparathyroidism of renal origin: Secondary | ICD-10-CM | POA: Diagnosis not present

## 2019-04-28 DIAGNOSIS — Z992 Dependence on renal dialysis: Secondary | ICD-10-CM | POA: Diagnosis not present

## 2019-04-28 DIAGNOSIS — E875 Hyperkalemia: Secondary | ICD-10-CM | POA: Diagnosis not present

## 2019-04-28 DIAGNOSIS — D509 Iron deficiency anemia, unspecified: Secondary | ICD-10-CM | POA: Diagnosis not present

## 2019-04-28 DIAGNOSIS — D631 Anemia in chronic kidney disease: Secondary | ICD-10-CM | POA: Diagnosis not present

## 2019-04-29 DIAGNOSIS — N186 End stage renal disease: Secondary | ICD-10-CM | POA: Diagnosis not present

## 2019-04-29 DIAGNOSIS — N2581 Secondary hyperparathyroidism of renal origin: Secondary | ICD-10-CM | POA: Diagnosis not present

## 2019-04-29 DIAGNOSIS — D509 Iron deficiency anemia, unspecified: Secondary | ICD-10-CM | POA: Diagnosis not present

## 2019-04-29 DIAGNOSIS — D631 Anemia in chronic kidney disease: Secondary | ICD-10-CM | POA: Diagnosis not present

## 2019-04-29 DIAGNOSIS — E875 Hyperkalemia: Secondary | ICD-10-CM | POA: Diagnosis not present

## 2019-04-29 DIAGNOSIS — Z992 Dependence on renal dialysis: Secondary | ICD-10-CM | POA: Diagnosis not present

## 2019-05-01 ENCOUNTER — Ambulatory Visit (INDEPENDENT_AMBULATORY_CARE_PROVIDER_SITE_OTHER): Payer: Medicare Other | Admitting: Neurology

## 2019-05-01 ENCOUNTER — Other Ambulatory Visit: Payer: Self-pay

## 2019-05-01 ENCOUNTER — Encounter: Payer: Self-pay | Admitting: Neurology

## 2019-05-01 DIAGNOSIS — M25362 Other instability, left knee: Secondary | ICD-10-CM

## 2019-05-01 DIAGNOSIS — G629 Polyneuropathy, unspecified: Secondary | ICD-10-CM

## 2019-05-01 DIAGNOSIS — D509 Iron deficiency anemia, unspecified: Secondary | ICD-10-CM | POA: Diagnosis not present

## 2019-05-01 DIAGNOSIS — N2581 Secondary hyperparathyroidism of renal origin: Secondary | ICD-10-CM | POA: Diagnosis not present

## 2019-05-01 DIAGNOSIS — G603 Idiopathic progressive neuropathy: Secondary | ICD-10-CM

## 2019-05-01 DIAGNOSIS — N186 End stage renal disease: Secondary | ICD-10-CM | POA: Diagnosis not present

## 2019-05-01 DIAGNOSIS — Z992 Dependence on renal dialysis: Secondary | ICD-10-CM | POA: Diagnosis not present

## 2019-05-01 DIAGNOSIS — E875 Hyperkalemia: Secondary | ICD-10-CM | POA: Diagnosis not present

## 2019-05-01 DIAGNOSIS — D631 Anemia in chronic kidney disease: Secondary | ICD-10-CM | POA: Diagnosis not present

## 2019-05-01 HISTORY — DX: Polyneuropathy, unspecified: G62.9

## 2019-05-01 NOTE — Procedures (Signed)
     HISTORY:  Terry Juarez is a 53 year old patient with a history of cerebrovascular disease with left hemiparesis who has noted some numbness in the feet of the last 3 to 4 months with onset of problems with weakness sensation involving mainly the left leg with a tendency for the leg to collapse at the knee.  The patient was found to have a low calcium level and correction of this has helped.  The patient does note some problems with low back pain and pain down the left leg.  She is being evaluated for possible neuropathy or a lumbosacral radiculopathy.  NERVE CONDUCTION STUDIES:  Nerve conduction studies were performed on both lower extremities.  No response was seen for the peroneal nerves on either side, the distal motor latencies for the posterior tibial nerves were prolonged on the right and normal on the left with low motor amplitudes on the right, normal on the left.  The nerve conduction velocities for the posterior tibial nerves were slowed bilaterally.  The sural sensory latency on the left was prolonged, normal on the right and unobtainable for the left peroneal nerve, prolonged on the right.  The F-wave latencies for the posterior tibial nerves were prolonged bilaterally.  EMG STUDIES:  EMG study was performed on the left lower extremity:  The tibialis anterior muscle reveals 2 to 4K motor units with decreased recruitment. No fibrillations or positive waves were seen. The peroneus tertius muscle reveals 2 to 6K motor units with decreased recruitment. No fibrillations or positive waves were seen. The medial gastrocnemius muscle reveals 1 to 3K motor units with decreased recruitment. No fibrillations or positive waves were seen. The vastus lateralis muscle reveals 2 to 4K motor units with full recruitment. No fibrillations or positive waves were seen. The iliopsoas muscle reveals 2 to 4K motor units with full recruitment. No fibrillations or positive waves were seen. The biceps  femoris muscle (long head) reveals 2 to 4K motor units with full recruitment. No fibrillations or positive waves were seen. The lumbosacral paraspinal muscles were tested at 3 levels, and revealed no abnormalities of insertional activity at all 3 levels tested. There was good relaxation.   IMPRESSION:  Nerve conduction studies done on both lower extremities shows evidence of a primarily axonal peripheral neuropathy of moderate to severe severity.  EMG evaluation of the left lower extremity shows distal chronic neuropathic signs of denervation consistent with the diagnosis of peripheral neuropathy, no clear evidence of an overlying lumbosacral radiculopathy was seen.  Jill Alexanders MD 05/01/2019 9:25 AM  [facility] [address] [place], [place] [address]  Phone [phone] Fax [phone]

## 2019-05-01 NOTE — Progress Notes (Signed)
Please refer to EMG and nerve conduction procedure note.  

## 2019-05-02 NOTE — Progress Notes (Signed)
Terry Juarez    Nerve / Sites Muscle Latency Ref. Amplitude Ref. Rel Amp Segments Distance Velocity Ref. Area    ms ms mV mV %  cm m/s m/s mVms  L Peroneal - EDB     Ankle EDB NR ?6.5 NR ?2.0 NR Ankle - EDB 9   NR     Fib head EDB NR  NR  NR Fib head - Ankle 26 NR ?44 NR     Pop fossa EDB NR  NR  NR Pop fossa - Fib head 10 NR ?44 NR         Pop fossa - Ankle      R Peroneal - EDB     Ankle EDB NR ?6.5 NR ?2.0 NR Ankle - EDB 9   NR     Fib head EDB NR  NR  NR Fib head - Ankle 26 NR ?44 NR     Pop fossa EDB NR  NR  NR Pop fossa - Fib head 10 NR ?44 NR         Pop fossa - Ankle      L Tibial - AH     Ankle AH 5.7 ?5.8 5.2 ?4.0 100 Ankle - AH 9   14.0     Pop fossa AH 17.5  3.7  70.5 Pop fossa - Ankle 36 31 ?41 12.8  R Tibial - AH     Ankle AH 6.3 ?5.8 1.8 ?4.0 100 Ankle - AH 9   6.5     Pop fossa AH 17.1  1.4  79.1 Pop fossa - Ankle 36 33 ?41 3.3             SNC    Nerve / Sites Rec. Site Peak Lat Ref.  Amp Ref. Segments Distance    ms ms V V  cm  L Sural - Ankle (Calf)     Calf Ankle 4.6 ?4.4 3 ?6 Calf - Ankle 14  R Sural - Ankle (Calf)     Calf Ankle 3.4 ?4.4 5 ?6 Calf - Ankle 14  L Superficial peroneal - Ankle     Lat leg Ankle NR ?4.4 NR ?6 Lat leg - Ankle 14  R Superficial peroneal - Ankle     Lat leg Ankle 4.6 ?4.4 3 ?6 Lat leg - Ankle 14              F  Wave    Nerve F Lat Ref.   ms ms  L Tibial - AH 70.1 ?56.0  R Tibial - AH 72.3 ?56.0

## 2019-05-03 DIAGNOSIS — D509 Iron deficiency anemia, unspecified: Secondary | ICD-10-CM | POA: Diagnosis not present

## 2019-05-03 DIAGNOSIS — E875 Hyperkalemia: Secondary | ICD-10-CM | POA: Diagnosis not present

## 2019-05-03 DIAGNOSIS — N186 End stage renal disease: Secondary | ICD-10-CM | POA: Diagnosis not present

## 2019-05-03 DIAGNOSIS — N2581 Secondary hyperparathyroidism of renal origin: Secondary | ICD-10-CM | POA: Diagnosis not present

## 2019-05-03 DIAGNOSIS — Z992 Dependence on renal dialysis: Secondary | ICD-10-CM | POA: Diagnosis not present

## 2019-05-03 DIAGNOSIS — D631 Anemia in chronic kidney disease: Secondary | ICD-10-CM | POA: Diagnosis not present

## 2019-05-05 DIAGNOSIS — Z992 Dependence on renal dialysis: Secondary | ICD-10-CM | POA: Diagnosis not present

## 2019-05-05 DIAGNOSIS — D509 Iron deficiency anemia, unspecified: Secondary | ICD-10-CM | POA: Diagnosis not present

## 2019-05-05 DIAGNOSIS — D631 Anemia in chronic kidney disease: Secondary | ICD-10-CM | POA: Diagnosis not present

## 2019-05-05 DIAGNOSIS — N2581 Secondary hyperparathyroidism of renal origin: Secondary | ICD-10-CM | POA: Diagnosis not present

## 2019-05-05 DIAGNOSIS — N186 End stage renal disease: Secondary | ICD-10-CM | POA: Diagnosis not present

## 2019-05-05 DIAGNOSIS — E875 Hyperkalemia: Secondary | ICD-10-CM | POA: Diagnosis not present

## 2019-05-06 DIAGNOSIS — E875 Hyperkalemia: Secondary | ICD-10-CM | POA: Diagnosis not present

## 2019-05-06 DIAGNOSIS — N186 End stage renal disease: Secondary | ICD-10-CM | POA: Diagnosis not present

## 2019-05-06 DIAGNOSIS — Z992 Dependence on renal dialysis: Secondary | ICD-10-CM | POA: Diagnosis not present

## 2019-05-06 DIAGNOSIS — N2581 Secondary hyperparathyroidism of renal origin: Secondary | ICD-10-CM | POA: Diagnosis not present

## 2019-05-06 DIAGNOSIS — D631 Anemia in chronic kidney disease: Secondary | ICD-10-CM | POA: Diagnosis not present

## 2019-05-06 DIAGNOSIS — D509 Iron deficiency anemia, unspecified: Secondary | ICD-10-CM | POA: Diagnosis not present

## 2019-05-07 DIAGNOSIS — Z992 Dependence on renal dialysis: Secondary | ICD-10-CM | POA: Diagnosis not present

## 2019-05-07 DIAGNOSIS — N186 End stage renal disease: Secondary | ICD-10-CM | POA: Diagnosis not present

## 2019-05-07 DIAGNOSIS — N041 Nephrotic syndrome with focal and segmental glomerular lesions: Secondary | ICD-10-CM | POA: Diagnosis not present

## 2019-05-08 DIAGNOSIS — Z4931 Encounter for adequacy testing for hemodialysis: Secondary | ICD-10-CM | POA: Diagnosis not present

## 2019-05-08 DIAGNOSIS — E8770 Fluid overload, unspecified: Secondary | ICD-10-CM | POA: Diagnosis not present

## 2019-05-08 DIAGNOSIS — N186 End stage renal disease: Secondary | ICD-10-CM | POA: Diagnosis not present

## 2019-05-08 DIAGNOSIS — E875 Hyperkalemia: Secondary | ICD-10-CM | POA: Diagnosis not present

## 2019-05-08 DIAGNOSIS — R17 Unspecified jaundice: Secondary | ICD-10-CM | POA: Diagnosis not present

## 2019-05-08 DIAGNOSIS — Z79899 Other long term (current) drug therapy: Secondary | ICD-10-CM | POA: Diagnosis not present

## 2019-05-08 DIAGNOSIS — E440 Moderate protein-calorie malnutrition: Secondary | ICD-10-CM | POA: Diagnosis not present

## 2019-05-08 DIAGNOSIS — D509 Iron deficiency anemia, unspecified: Secondary | ICD-10-CM | POA: Diagnosis not present

## 2019-05-08 DIAGNOSIS — Z992 Dependence on renal dialysis: Secondary | ICD-10-CM | POA: Diagnosis not present

## 2019-05-08 DIAGNOSIS — E790 Hyperuricemia without signs of inflammatory arthritis and tophaceous disease: Secondary | ICD-10-CM | POA: Diagnosis not present

## 2019-05-08 DIAGNOSIS — Z5189 Encounter for other specified aftercare: Secondary | ICD-10-CM | POA: Diagnosis not present

## 2019-05-08 DIAGNOSIS — E890 Postprocedural hypothyroidism: Secondary | ICD-10-CM | POA: Diagnosis not present

## 2019-05-08 DIAGNOSIS — D631 Anemia in chronic kidney disease: Secondary | ICD-10-CM | POA: Diagnosis not present

## 2019-05-08 DIAGNOSIS — N2581 Secondary hyperparathyroidism of renal origin: Secondary | ICD-10-CM | POA: Diagnosis not present

## 2019-05-10 DIAGNOSIS — Z992 Dependence on renal dialysis: Secondary | ICD-10-CM | POA: Diagnosis not present

## 2019-05-10 DIAGNOSIS — E875 Hyperkalemia: Secondary | ICD-10-CM | POA: Diagnosis not present

## 2019-05-10 DIAGNOSIS — D631 Anemia in chronic kidney disease: Secondary | ICD-10-CM | POA: Diagnosis not present

## 2019-05-10 DIAGNOSIS — N186 End stage renal disease: Secondary | ICD-10-CM | POA: Diagnosis not present

## 2019-05-10 DIAGNOSIS — Z79899 Other long term (current) drug therapy: Secondary | ICD-10-CM | POA: Diagnosis not present

## 2019-05-10 DIAGNOSIS — N2581 Secondary hyperparathyroidism of renal origin: Secondary | ICD-10-CM | POA: Diagnosis not present

## 2019-05-12 DIAGNOSIS — E875 Hyperkalemia: Secondary | ICD-10-CM | POA: Diagnosis not present

## 2019-05-12 DIAGNOSIS — N2581 Secondary hyperparathyroidism of renal origin: Secondary | ICD-10-CM | POA: Diagnosis not present

## 2019-05-12 DIAGNOSIS — Z79899 Other long term (current) drug therapy: Secondary | ICD-10-CM | POA: Diagnosis not present

## 2019-05-12 DIAGNOSIS — N186 End stage renal disease: Secondary | ICD-10-CM | POA: Diagnosis not present

## 2019-05-12 DIAGNOSIS — D631 Anemia in chronic kidney disease: Secondary | ICD-10-CM | POA: Diagnosis not present

## 2019-05-12 DIAGNOSIS — Z992 Dependence on renal dialysis: Secondary | ICD-10-CM | POA: Diagnosis not present

## 2019-05-13 DIAGNOSIS — E875 Hyperkalemia: Secondary | ICD-10-CM | POA: Diagnosis not present

## 2019-05-13 DIAGNOSIS — N2581 Secondary hyperparathyroidism of renal origin: Secondary | ICD-10-CM | POA: Diagnosis not present

## 2019-05-13 DIAGNOSIS — N186 End stage renal disease: Secondary | ICD-10-CM | POA: Diagnosis not present

## 2019-05-13 DIAGNOSIS — Z992 Dependence on renal dialysis: Secondary | ICD-10-CM | POA: Diagnosis not present

## 2019-05-13 DIAGNOSIS — Z79899 Other long term (current) drug therapy: Secondary | ICD-10-CM | POA: Diagnosis not present

## 2019-05-13 DIAGNOSIS — D631 Anemia in chronic kidney disease: Secondary | ICD-10-CM | POA: Diagnosis not present

## 2019-05-15 DIAGNOSIS — N186 End stage renal disease: Secondary | ICD-10-CM | POA: Diagnosis not present

## 2019-05-15 DIAGNOSIS — D631 Anemia in chronic kidney disease: Secondary | ICD-10-CM | POA: Diagnosis not present

## 2019-05-15 DIAGNOSIS — E875 Hyperkalemia: Secondary | ICD-10-CM | POA: Diagnosis not present

## 2019-05-15 DIAGNOSIS — Z79899 Other long term (current) drug therapy: Secondary | ICD-10-CM | POA: Diagnosis not present

## 2019-05-15 DIAGNOSIS — N2581 Secondary hyperparathyroidism of renal origin: Secondary | ICD-10-CM | POA: Diagnosis not present

## 2019-05-15 DIAGNOSIS — Z992 Dependence on renal dialysis: Secondary | ICD-10-CM | POA: Diagnosis not present

## 2019-05-17 DIAGNOSIS — N186 End stage renal disease: Secondary | ICD-10-CM | POA: Diagnosis not present

## 2019-05-17 DIAGNOSIS — N2581 Secondary hyperparathyroidism of renal origin: Secondary | ICD-10-CM | POA: Diagnosis not present

## 2019-05-17 DIAGNOSIS — Z992 Dependence on renal dialysis: Secondary | ICD-10-CM | POA: Diagnosis not present

## 2019-05-17 DIAGNOSIS — Z79899 Other long term (current) drug therapy: Secondary | ICD-10-CM | POA: Diagnosis not present

## 2019-05-17 DIAGNOSIS — D631 Anemia in chronic kidney disease: Secondary | ICD-10-CM | POA: Diagnosis not present

## 2019-05-17 DIAGNOSIS — E875 Hyperkalemia: Secondary | ICD-10-CM | POA: Diagnosis not present

## 2019-05-19 DIAGNOSIS — Z992 Dependence on renal dialysis: Secondary | ICD-10-CM | POA: Diagnosis not present

## 2019-05-19 DIAGNOSIS — N2581 Secondary hyperparathyroidism of renal origin: Secondary | ICD-10-CM | POA: Diagnosis not present

## 2019-05-19 DIAGNOSIS — Z79899 Other long term (current) drug therapy: Secondary | ICD-10-CM | POA: Diagnosis not present

## 2019-05-19 DIAGNOSIS — E875 Hyperkalemia: Secondary | ICD-10-CM | POA: Diagnosis not present

## 2019-05-19 DIAGNOSIS — D631 Anemia in chronic kidney disease: Secondary | ICD-10-CM | POA: Diagnosis not present

## 2019-05-19 DIAGNOSIS — N186 End stage renal disease: Secondary | ICD-10-CM | POA: Diagnosis not present

## 2019-05-20 DIAGNOSIS — N2581 Secondary hyperparathyroidism of renal origin: Secondary | ICD-10-CM | POA: Diagnosis not present

## 2019-05-20 DIAGNOSIS — D631 Anemia in chronic kidney disease: Secondary | ICD-10-CM | POA: Diagnosis not present

## 2019-05-20 DIAGNOSIS — Z79899 Other long term (current) drug therapy: Secondary | ICD-10-CM | POA: Diagnosis not present

## 2019-05-20 DIAGNOSIS — E875 Hyperkalemia: Secondary | ICD-10-CM | POA: Diagnosis not present

## 2019-05-20 DIAGNOSIS — N186 End stage renal disease: Secondary | ICD-10-CM | POA: Diagnosis not present

## 2019-05-20 DIAGNOSIS — Z992 Dependence on renal dialysis: Secondary | ICD-10-CM | POA: Diagnosis not present

## 2019-05-22 DIAGNOSIS — Z992 Dependence on renal dialysis: Secondary | ICD-10-CM | POA: Diagnosis not present

## 2019-05-22 DIAGNOSIS — E875 Hyperkalemia: Secondary | ICD-10-CM | POA: Diagnosis not present

## 2019-05-22 DIAGNOSIS — N186 End stage renal disease: Secondary | ICD-10-CM | POA: Diagnosis not present

## 2019-05-22 DIAGNOSIS — Z79899 Other long term (current) drug therapy: Secondary | ICD-10-CM | POA: Diagnosis not present

## 2019-05-22 DIAGNOSIS — N2581 Secondary hyperparathyroidism of renal origin: Secondary | ICD-10-CM | POA: Diagnosis not present

## 2019-05-22 DIAGNOSIS — D631 Anemia in chronic kidney disease: Secondary | ICD-10-CM | POA: Diagnosis not present

## 2019-05-24 DIAGNOSIS — N186 End stage renal disease: Secondary | ICD-10-CM | POA: Diagnosis not present

## 2019-05-24 DIAGNOSIS — Z79899 Other long term (current) drug therapy: Secondary | ICD-10-CM | POA: Diagnosis not present

## 2019-05-24 DIAGNOSIS — E875 Hyperkalemia: Secondary | ICD-10-CM | POA: Diagnosis not present

## 2019-05-24 DIAGNOSIS — D631 Anemia in chronic kidney disease: Secondary | ICD-10-CM | POA: Diagnosis not present

## 2019-05-24 DIAGNOSIS — Z992 Dependence on renal dialysis: Secondary | ICD-10-CM | POA: Diagnosis not present

## 2019-05-24 DIAGNOSIS — N2581 Secondary hyperparathyroidism of renal origin: Secondary | ICD-10-CM | POA: Diagnosis not present

## 2019-05-25 ENCOUNTER — Ambulatory Visit: Payer: Medicare Other | Admitting: Adult Health

## 2019-05-25 ENCOUNTER — Telehealth: Payer: Self-pay | Admitting: *Deleted

## 2019-05-25 ENCOUNTER — Encounter: Payer: Self-pay | Admitting: Adult Health

## 2019-05-25 NOTE — Progress Notes (Deleted)
STROKE NEUROLOGY FOLLOW UP NOTE  NAME: ATAYA MURDY DOB: April 06, 1966  REASON FOR VISIT: stroke follow up HISTORY FROM: pt and chart    No chief complaint on file.    History Summary Ms. FARAH LEPAK is a 53 y.o. female with history of a renal cell carcinoma s/p right nephrectomy, hypertension, end-stage renal disease on peritoneal dialysis, hyperlipidemia, and anemia admitted on 12/19/16 for seizure, left-sided weakness, and right frontal headache. CT head showed right posterior frontal lobe acute ICH. MRI head consistent with PRES. CTA unremarkable except FMD of bilateral cervical ICAs. Repeat head CT with stable hematoma. EF 60-65%. EEG diffuse slowing, no seizure activity, put on Keppra. BP eventually controlled with multiple PO meds. Attempted twice for HD, however, patient not able to tolerate, the second attempt even caused cardiac arrest s/p intubation. Switched back to PD. Pt condition gradually improved, but still has left hemiplegia. Discharge to CIR.  04/06/17 follow up [facility] - the patient has been doing well. Left hemiplegia much improved. Currently, patient able to walk with cane with left PFO brace. Still has left arm plegia. BP much improved, today 130/86, still on Keppra, no seizures. However, patient has been back to driving although less than 6 months of last seizure. She was told to restrain from driving until 6 months free from seizure. She expressed understanding and willing to do so. Has outpt PT/OT  07/27/2017 visit JX: During the interval time, pt has been doing well.  Follow with PMR, had a Botox at left upper extremity.  Currently left upper and lower extremity further improved strength.  Not driving now due to possible seizure in 12/2016 during hospitalization.  Not continued outpatient PT/OT due to no transportation at this time.  Continued PD at home, blood pressure was low about 2 weeks ago at 80-90s.  About the same time, patient had 2 episode of whole body tremor  for 5 minutes each, did not lose consciousness, resolved after laying in bed.  Episode concerning for related to low blood pressure.  Currently blood pressure improved, on the high side, nephrologist is managing the BP meds.  Today in clinic BP 148/97.  Continued on Keppra without side effects.  Update 07/04/2018: Patient is being seen today for follow-up visit.  She has complaints of for approximately the last month she has been experiencing right hand numbness and tingling intermittently.  She is right-hand dominant.  She states it is present in the palm of her hand and all 5 fingers.  She denies the symptoms in the past.  Denies any numbness occurring past her wrist, face or right leg.  Denies any weakness.  Denies any headaches.  Denies any other neurological concern.  Symptoms will resolve on their own.  She also has concerns of whole body tremors that lasts for 5 to 10 minutes and occurs a couple times a month.  This has been present since she initially presented with seizures but it was felt as though these could be related to her low blood pressure.  She states blood pressures have been stable at home and has not had any recent hypotension episodes.  She has recently stopped PD day the PD catheter removed on 06/01/2018 and restarted using prior fistula and does hemodialysis treatments at home 4 times weekly.  She has continued on Keppra 500 mg twice daily.  She does state that these events typically occur in the evening time.  She denies any loss of consciousness during these episodes.  Blood pressure  today 153/88.  She does continue to follow with nephrology for BP management.  She has continued on aspirin 81 mg without side effects of bleeding or bruising.  Continues on atorvastatin without side effects myalgias.  No further concerns at this time.  She continues to use cane for ambulation assistance due to left hemiparesis post stroke.  Denies new or worsening stroke/TIA symptoms.  Update 03/22/2019:  Ms. Golinski is being seen today per patient request due to increased difficulty with left knee giving out with prior left hemiparesis post stroke.  She states she will have knee buckling occasionally previously but over the past month, this has been worsening where she has been experiencing left knee "freezing up"/stiffens, starts tremoring/spasms and then will give out.  Symptoms have been occurring multiple times throughout the day and can occur while standing or even sitting.  She denies any pain associated.  She denies any trauma prior to worsening of symptoms or any falls related to symptoms.  She has continued ambulating with a cane but more recently due to fear of falling, she has been limiting mobility and currently in a wheelchair today's visit.  She continues on Keppra 500 mg twice daily without recurrent seizure activity.  Continues on aspirin and Crestor for secondary stroke prevention without side effects.  Blood pressure today mildly elevated at 166/94 due to increased anxiety with worsening of symptoms.  No further concerns at this time.  Update 05/25/2019: Ms. Goodridge is being seen today for follow-up.  At prior visit, concern for worsening left leg buckling and tremors.  Residual stroke deficit left hemiparesis.  NCV showed a primarily axonal peripheral neuropathy of moderate to severe severity of both lower extremities and EMG showed left lower extremity distal chronic neuropathic signs consistent with peripheral neuropathy but no evidence of lumbosacral radiculopathy.  She was found to have low calcium level with improvement of symptoms after correction.  She continues on Keppra 500 mg twice daily without reoccurring seizure activity.  Continues on aspirin and Crestor for secondary stroke prevention.  Blood pressure today ***.  Continues to receive HD for ESRD.  No further concerns at this time.     REVIEW OF SYSTEMS: Full 14 system review of systems performed and notable only for those listed  below and in HPI above, all others are negative:  Weakness and ambulation difficulty  The following represents the patient's updated allergies and side effects list: Allergies  Allergen Reactions   Lisinopril Cough    The neurologically relevant items on the patient's problem list were reviewed on today's visit.  Neurologic Examination   There were no vitals taken for this visit.  General - Well nourished, well developed, frail middle-aged pleasant African-American female, in no apparent distress.  Ophthalmologic -visual fields intact.  No evidence of nystagmus..  Cardiovascular - Regular rate and rhythm with no murmur.  Mental Status -  Level of arousal and orientation to time, place, and person were intact. Language including expression, naming, repetition, comprehension was assessed and found intact. Attention span and concentration were normal. Fund of Knowledge was assessed and was intact.  Cranial Nerves II - XII - II - Visual field intact OU. III, IV, VI - Extraocular movements intact. V - Facial sensation intact bilaterally. VII - left nasolabial fold flattening. VIII - Hearing & vestibular intact bilaterally. X - Palate elevates symmetrically. XI - Chin turning & shoulder shrug intact bilaterally. XII - Tongue protrusion intact.  Motor Strength - The patients strength was normal in RUE  and RLE, however, LUE 3-/5 proximal and 2/5 distal with increased tone. LLE 5/5 proximal and 4/5 distal. Bulk was normal and fasciculations were absent.   Motor Tone - Muscle tone was assessed at the neck and appendages and was increased on the left  Reflexes - The patients reflexes were 2+ in LUE and LLE and she had no pathological reflexes.  Sensory - Light touch, temperature/pinprick were assessed and were normal.    Coordination - The patient had normal movements in the right hand with no ataxia or dysmetria.  Tremor was absent.  Gait and Station - gait assessment  deferred        Assessment: Saralyn Willison is a 53 year old female with right frontal lobe acute ICH with SAH on 12/19/2016 secondary to hypertension with increase in hemorrhage size and MRI evidence of PRES. she also presented with seizure activity at that time and was placed on Keppra.  Vascular risk factors include renal cell carcinoma, HTN, end-stage renal disease on PD, HLD and anemia.  Residual deficits of left hemiparesis.  She is being seen today per patient request due to increased episodes of left knee buckling causing increased gait difficulty   Plan:  -Compared to prior exam, prior LLE weakness improved with very mild remaining weakness.  Sensation equal.  Recommend undergoing EMG/NCV to look for possible underlying abnormalities.  Could also be residual stroke deficit with increasing spasticity which was discussed with patient.  Referral placed for outpatient PT for improvement of gait.  - continue aspirin 81 mg and Lipitor for secondary stroke prevention - continue keppra 500 mg twice daily for seizure prophylaxis - check BP at home and record - continue HD at home and follow up with nephrology for BP management -Maintain strict control of hypertension with blood pressure goal below 130/90, diabetes with hemoglobin A1c goal below 6.5% and cholesterol with LDL cholesterol (bad cholesterol) goal below 70 mg/dL. I also advised the patient to eat a healthy diet with plenty of whole grains, cereals, fruits and vegetables, exercise regularly and maintain ideal body weight.  Followup in the future with me in 2 months or call earlier if needed  I spent more than 25 minutes of face to face time with the patient. Greater than 50% of time was spent in counseling and coordination of care along with discussion regarding new onset of symptoms.  We discussed post stroke deficits with potential increase of spasticity versus potential underlying etiology, ongoing use of Keppra for seizure prophylaxis  and continued management of stroke risk factors.  Frann Rider, AGNP-BC  [facility] [address] [place], [place] [address]  Phone [phone] Fax [phone] Note: This document was prepared with digital dictation and possible smart phrase technology. Any transcriptional errors that result from this process are unintentional.

## 2019-05-25 NOTE — Telephone Encounter (Signed)
Patient was no show for follow up with NP today.  

## 2019-05-26 DIAGNOSIS — N186 End stage renal disease: Secondary | ICD-10-CM | POA: Diagnosis not present

## 2019-05-26 DIAGNOSIS — Z992 Dependence on renal dialysis: Secondary | ICD-10-CM | POA: Diagnosis not present

## 2019-05-26 DIAGNOSIS — E875 Hyperkalemia: Secondary | ICD-10-CM | POA: Diagnosis not present

## 2019-05-26 DIAGNOSIS — D631 Anemia in chronic kidney disease: Secondary | ICD-10-CM | POA: Diagnosis not present

## 2019-05-26 DIAGNOSIS — Z79899 Other long term (current) drug therapy: Secondary | ICD-10-CM | POA: Diagnosis not present

## 2019-05-26 DIAGNOSIS — N2581 Secondary hyperparathyroidism of renal origin: Secondary | ICD-10-CM | POA: Diagnosis not present

## 2019-05-27 DIAGNOSIS — N2581 Secondary hyperparathyroidism of renal origin: Secondary | ICD-10-CM | POA: Diagnosis not present

## 2019-05-27 DIAGNOSIS — N186 End stage renal disease: Secondary | ICD-10-CM | POA: Diagnosis not present

## 2019-05-27 DIAGNOSIS — Z992 Dependence on renal dialysis: Secondary | ICD-10-CM | POA: Diagnosis not present

## 2019-05-27 DIAGNOSIS — D631 Anemia in chronic kidney disease: Secondary | ICD-10-CM | POA: Diagnosis not present

## 2019-05-27 DIAGNOSIS — Z79899 Other long term (current) drug therapy: Secondary | ICD-10-CM | POA: Diagnosis not present

## 2019-05-27 DIAGNOSIS — E875 Hyperkalemia: Secondary | ICD-10-CM | POA: Diagnosis not present

## 2019-05-29 DIAGNOSIS — D631 Anemia in chronic kidney disease: Secondary | ICD-10-CM | POA: Diagnosis not present

## 2019-05-29 DIAGNOSIS — E875 Hyperkalemia: Secondary | ICD-10-CM | POA: Diagnosis not present

## 2019-05-29 DIAGNOSIS — Z992 Dependence on renal dialysis: Secondary | ICD-10-CM | POA: Diagnosis not present

## 2019-05-29 DIAGNOSIS — N186 End stage renal disease: Secondary | ICD-10-CM | POA: Diagnosis not present

## 2019-05-29 DIAGNOSIS — N2581 Secondary hyperparathyroidism of renal origin: Secondary | ICD-10-CM | POA: Diagnosis not present

## 2019-05-29 DIAGNOSIS — Z79899 Other long term (current) drug therapy: Secondary | ICD-10-CM | POA: Diagnosis not present

## 2019-05-31 DIAGNOSIS — N186 End stage renal disease: Secondary | ICD-10-CM | POA: Diagnosis not present

## 2019-05-31 DIAGNOSIS — N2581 Secondary hyperparathyroidism of renal origin: Secondary | ICD-10-CM | POA: Diagnosis not present

## 2019-05-31 DIAGNOSIS — Z992 Dependence on renal dialysis: Secondary | ICD-10-CM | POA: Diagnosis not present

## 2019-05-31 DIAGNOSIS — Z79899 Other long term (current) drug therapy: Secondary | ICD-10-CM | POA: Diagnosis not present

## 2019-05-31 DIAGNOSIS — D631 Anemia in chronic kidney disease: Secondary | ICD-10-CM | POA: Diagnosis not present

## 2019-05-31 DIAGNOSIS — E875 Hyperkalemia: Secondary | ICD-10-CM | POA: Diagnosis not present

## 2019-06-01 DIAGNOSIS — E875 Hyperkalemia: Secondary | ICD-10-CM | POA: Diagnosis not present

## 2019-06-01 DIAGNOSIS — N186 End stage renal disease: Secondary | ICD-10-CM | POA: Diagnosis not present

## 2019-06-01 DIAGNOSIS — N2581 Secondary hyperparathyroidism of renal origin: Secondary | ICD-10-CM | POA: Diagnosis not present

## 2019-06-01 DIAGNOSIS — Z79899 Other long term (current) drug therapy: Secondary | ICD-10-CM | POA: Diagnosis not present

## 2019-06-01 DIAGNOSIS — Z992 Dependence on renal dialysis: Secondary | ICD-10-CM | POA: Diagnosis not present

## 2019-06-01 DIAGNOSIS — D631 Anemia in chronic kidney disease: Secondary | ICD-10-CM | POA: Diagnosis not present

## 2019-06-02 DIAGNOSIS — Z992 Dependence on renal dialysis: Secondary | ICD-10-CM | POA: Diagnosis not present

## 2019-06-02 DIAGNOSIS — E875 Hyperkalemia: Secondary | ICD-10-CM | POA: Diagnosis not present

## 2019-06-02 DIAGNOSIS — N2581 Secondary hyperparathyroidism of renal origin: Secondary | ICD-10-CM | POA: Diagnosis not present

## 2019-06-02 DIAGNOSIS — D631 Anemia in chronic kidney disease: Secondary | ICD-10-CM | POA: Diagnosis not present

## 2019-06-02 DIAGNOSIS — N186 End stage renal disease: Secondary | ICD-10-CM | POA: Diagnosis not present

## 2019-06-02 DIAGNOSIS — Z79899 Other long term (current) drug therapy: Secondary | ICD-10-CM | POA: Diagnosis not present

## 2019-06-03 DIAGNOSIS — Z992 Dependence on renal dialysis: Secondary | ICD-10-CM | POA: Diagnosis not present

## 2019-06-03 DIAGNOSIS — N186 End stage renal disease: Secondary | ICD-10-CM | POA: Diagnosis not present

## 2019-06-03 DIAGNOSIS — E875 Hyperkalemia: Secondary | ICD-10-CM | POA: Diagnosis not present

## 2019-06-03 DIAGNOSIS — Z79899 Other long term (current) drug therapy: Secondary | ICD-10-CM | POA: Diagnosis not present

## 2019-06-03 DIAGNOSIS — N2581 Secondary hyperparathyroidism of renal origin: Secondary | ICD-10-CM | POA: Diagnosis not present

## 2019-06-03 DIAGNOSIS — D631 Anemia in chronic kidney disease: Secondary | ICD-10-CM | POA: Diagnosis not present

## 2019-06-05 DIAGNOSIS — D631 Anemia in chronic kidney disease: Secondary | ICD-10-CM | POA: Diagnosis not present

## 2019-06-05 DIAGNOSIS — Z79899 Other long term (current) drug therapy: Secondary | ICD-10-CM | POA: Diagnosis not present

## 2019-06-05 DIAGNOSIS — N186 End stage renal disease: Secondary | ICD-10-CM | POA: Diagnosis not present

## 2019-06-05 DIAGNOSIS — E875 Hyperkalemia: Secondary | ICD-10-CM | POA: Diagnosis not present

## 2019-06-05 DIAGNOSIS — N2581 Secondary hyperparathyroidism of renal origin: Secondary | ICD-10-CM | POA: Diagnosis not present

## 2019-06-05 DIAGNOSIS — Z992 Dependence on renal dialysis: Secondary | ICD-10-CM | POA: Diagnosis not present

## 2019-06-07 DIAGNOSIS — N2581 Secondary hyperparathyroidism of renal origin: Secondary | ICD-10-CM | POA: Diagnosis not present

## 2019-06-07 DIAGNOSIS — N186 End stage renal disease: Secondary | ICD-10-CM | POA: Diagnosis not present

## 2019-06-07 DIAGNOSIS — E8770 Fluid overload, unspecified: Secondary | ICD-10-CM | POA: Diagnosis not present

## 2019-06-07 DIAGNOSIS — Z4931 Encounter for adequacy testing for hemodialysis: Secondary | ICD-10-CM | POA: Diagnosis not present

## 2019-06-07 DIAGNOSIS — Z992 Dependence on renal dialysis: Secondary | ICD-10-CM | POA: Diagnosis not present

## 2019-06-07 DIAGNOSIS — E440 Moderate protein-calorie malnutrition: Secondary | ICD-10-CM | POA: Diagnosis not present

## 2019-06-07 DIAGNOSIS — E875 Hyperkalemia: Secondary | ICD-10-CM | POA: Diagnosis not present

## 2019-06-07 DIAGNOSIS — D509 Iron deficiency anemia, unspecified: Secondary | ICD-10-CM | POA: Diagnosis not present

## 2019-06-07 DIAGNOSIS — D631 Anemia in chronic kidney disease: Secondary | ICD-10-CM | POA: Diagnosis not present

## 2019-06-07 DIAGNOSIS — K7689 Other specified diseases of liver: Secondary | ICD-10-CM | POA: Diagnosis not present

## 2019-06-08 DIAGNOSIS — I120 Hypertensive chronic kidney disease with stage 5 chronic kidney disease or end stage renal disease: Secondary | ICD-10-CM | POA: Insufficient documentation

## 2019-06-09 DIAGNOSIS — E875 Hyperkalemia: Secondary | ICD-10-CM | POA: Diagnosis not present

## 2019-06-09 DIAGNOSIS — N2581 Secondary hyperparathyroidism of renal origin: Secondary | ICD-10-CM | POA: Diagnosis not present

## 2019-06-09 DIAGNOSIS — K7689 Other specified diseases of liver: Secondary | ICD-10-CM | POA: Diagnosis not present

## 2019-06-09 DIAGNOSIS — N186 End stage renal disease: Secondary | ICD-10-CM | POA: Diagnosis not present

## 2019-06-09 DIAGNOSIS — Z992 Dependence on renal dialysis: Secondary | ICD-10-CM | POA: Diagnosis not present

## 2019-06-09 DIAGNOSIS — D631 Anemia in chronic kidney disease: Secondary | ICD-10-CM | POA: Diagnosis not present

## 2019-06-10 DIAGNOSIS — N186 End stage renal disease: Secondary | ICD-10-CM | POA: Diagnosis not present

## 2019-06-10 DIAGNOSIS — K7689 Other specified diseases of liver: Secondary | ICD-10-CM | POA: Diagnosis not present

## 2019-06-10 DIAGNOSIS — Z992 Dependence on renal dialysis: Secondary | ICD-10-CM | POA: Diagnosis not present

## 2019-06-10 DIAGNOSIS — N2581 Secondary hyperparathyroidism of renal origin: Secondary | ICD-10-CM | POA: Diagnosis not present

## 2019-06-10 DIAGNOSIS — D631 Anemia in chronic kidney disease: Secondary | ICD-10-CM | POA: Diagnosis not present

## 2019-06-10 DIAGNOSIS — E875 Hyperkalemia: Secondary | ICD-10-CM | POA: Diagnosis not present

## 2019-06-12 DIAGNOSIS — Z992 Dependence on renal dialysis: Secondary | ICD-10-CM | POA: Diagnosis not present

## 2019-06-12 DIAGNOSIS — N186 End stage renal disease: Secondary | ICD-10-CM | POA: Diagnosis not present

## 2019-06-12 DIAGNOSIS — E875 Hyperkalemia: Secondary | ICD-10-CM | POA: Diagnosis not present

## 2019-06-12 DIAGNOSIS — N2581 Secondary hyperparathyroidism of renal origin: Secondary | ICD-10-CM | POA: Diagnosis not present

## 2019-06-12 DIAGNOSIS — K7689 Other specified diseases of liver: Secondary | ICD-10-CM | POA: Diagnosis not present

## 2019-06-12 DIAGNOSIS — D631 Anemia in chronic kidney disease: Secondary | ICD-10-CM | POA: Diagnosis not present

## 2019-06-14 DIAGNOSIS — N186 End stage renal disease: Secondary | ICD-10-CM | POA: Diagnosis not present

## 2019-06-14 DIAGNOSIS — N2581 Secondary hyperparathyroidism of renal origin: Secondary | ICD-10-CM | POA: Diagnosis not present

## 2019-06-14 DIAGNOSIS — E875 Hyperkalemia: Secondary | ICD-10-CM | POA: Diagnosis not present

## 2019-06-14 DIAGNOSIS — Z992 Dependence on renal dialysis: Secondary | ICD-10-CM | POA: Diagnosis not present

## 2019-06-14 DIAGNOSIS — D631 Anemia in chronic kidney disease: Secondary | ICD-10-CM | POA: Diagnosis not present

## 2019-06-14 DIAGNOSIS — K7689 Other specified diseases of liver: Secondary | ICD-10-CM | POA: Diagnosis not present

## 2019-06-15 DIAGNOSIS — H40033 Anatomical narrow angle, bilateral: Secondary | ICD-10-CM | POA: Diagnosis not present

## 2019-06-15 DIAGNOSIS — H2513 Age-related nuclear cataract, bilateral: Secondary | ICD-10-CM | POA: Diagnosis not present

## 2019-06-16 DIAGNOSIS — K7689 Other specified diseases of liver: Secondary | ICD-10-CM | POA: Diagnosis not present

## 2019-06-16 DIAGNOSIS — N2581 Secondary hyperparathyroidism of renal origin: Secondary | ICD-10-CM | POA: Diagnosis not present

## 2019-06-16 DIAGNOSIS — Z992 Dependence on renal dialysis: Secondary | ICD-10-CM | POA: Diagnosis not present

## 2019-06-16 DIAGNOSIS — D631 Anemia in chronic kidney disease: Secondary | ICD-10-CM | POA: Diagnosis not present

## 2019-06-16 DIAGNOSIS — E875 Hyperkalemia: Secondary | ICD-10-CM | POA: Diagnosis not present

## 2019-06-16 DIAGNOSIS — N186 End stage renal disease: Secondary | ICD-10-CM | POA: Diagnosis not present

## 2019-06-17 DIAGNOSIS — N186 End stage renal disease: Secondary | ICD-10-CM | POA: Diagnosis not present

## 2019-06-17 DIAGNOSIS — E875 Hyperkalemia: Secondary | ICD-10-CM | POA: Diagnosis not present

## 2019-06-17 DIAGNOSIS — N2581 Secondary hyperparathyroidism of renal origin: Secondary | ICD-10-CM | POA: Diagnosis not present

## 2019-06-17 DIAGNOSIS — Z992 Dependence on renal dialysis: Secondary | ICD-10-CM | POA: Diagnosis not present

## 2019-06-17 DIAGNOSIS — D631 Anemia in chronic kidney disease: Secondary | ICD-10-CM | POA: Diagnosis not present

## 2019-06-17 DIAGNOSIS — K7689 Other specified diseases of liver: Secondary | ICD-10-CM | POA: Diagnosis not present

## 2019-06-19 DIAGNOSIS — N186 End stage renal disease: Secondary | ICD-10-CM | POA: Diagnosis not present

## 2019-06-19 DIAGNOSIS — N2581 Secondary hyperparathyroidism of renal origin: Secondary | ICD-10-CM | POA: Diagnosis not present

## 2019-06-19 DIAGNOSIS — Z992 Dependence on renal dialysis: Secondary | ICD-10-CM | POA: Diagnosis not present

## 2019-06-19 DIAGNOSIS — K7689 Other specified diseases of liver: Secondary | ICD-10-CM | POA: Diagnosis not present

## 2019-06-19 DIAGNOSIS — D631 Anemia in chronic kidney disease: Secondary | ICD-10-CM | POA: Diagnosis not present

## 2019-06-19 DIAGNOSIS — E875 Hyperkalemia: Secondary | ICD-10-CM | POA: Diagnosis not present

## 2019-06-21 DIAGNOSIS — Z992 Dependence on renal dialysis: Secondary | ICD-10-CM | POA: Diagnosis not present

## 2019-06-21 DIAGNOSIS — K7689 Other specified diseases of liver: Secondary | ICD-10-CM | POA: Diagnosis not present

## 2019-06-21 DIAGNOSIS — D631 Anemia in chronic kidney disease: Secondary | ICD-10-CM | POA: Diagnosis not present

## 2019-06-21 DIAGNOSIS — N2581 Secondary hyperparathyroidism of renal origin: Secondary | ICD-10-CM | POA: Diagnosis not present

## 2019-06-21 DIAGNOSIS — N186 End stage renal disease: Secondary | ICD-10-CM | POA: Diagnosis not present

## 2019-06-21 DIAGNOSIS — E875 Hyperkalemia: Secondary | ICD-10-CM | POA: Diagnosis not present

## 2019-06-23 DIAGNOSIS — D631 Anemia in chronic kidney disease: Secondary | ICD-10-CM | POA: Diagnosis not present

## 2019-06-23 DIAGNOSIS — K7689 Other specified diseases of liver: Secondary | ICD-10-CM | POA: Diagnosis not present

## 2019-06-23 DIAGNOSIS — E875 Hyperkalemia: Secondary | ICD-10-CM | POA: Diagnosis not present

## 2019-06-23 DIAGNOSIS — Z992 Dependence on renal dialysis: Secondary | ICD-10-CM | POA: Diagnosis not present

## 2019-06-23 DIAGNOSIS — N2581 Secondary hyperparathyroidism of renal origin: Secondary | ICD-10-CM | POA: Diagnosis not present

## 2019-06-23 DIAGNOSIS — N186 End stage renal disease: Secondary | ICD-10-CM | POA: Diagnosis not present

## 2019-06-24 DIAGNOSIS — K7689 Other specified diseases of liver: Secondary | ICD-10-CM | POA: Diagnosis not present

## 2019-06-24 DIAGNOSIS — Z992 Dependence on renal dialysis: Secondary | ICD-10-CM | POA: Diagnosis not present

## 2019-06-24 DIAGNOSIS — N186 End stage renal disease: Secondary | ICD-10-CM | POA: Diagnosis not present

## 2019-06-24 DIAGNOSIS — E875 Hyperkalemia: Secondary | ICD-10-CM | POA: Diagnosis not present

## 2019-06-24 DIAGNOSIS — D631 Anemia in chronic kidney disease: Secondary | ICD-10-CM | POA: Diagnosis not present

## 2019-06-24 DIAGNOSIS — N2581 Secondary hyperparathyroidism of renal origin: Secondary | ICD-10-CM | POA: Diagnosis not present

## 2019-06-26 DIAGNOSIS — N186 End stage renal disease: Secondary | ICD-10-CM | POA: Diagnosis not present

## 2019-06-26 DIAGNOSIS — N2581 Secondary hyperparathyroidism of renal origin: Secondary | ICD-10-CM | POA: Diagnosis not present

## 2019-06-26 DIAGNOSIS — E875 Hyperkalemia: Secondary | ICD-10-CM | POA: Diagnosis not present

## 2019-06-26 DIAGNOSIS — Z992 Dependence on renal dialysis: Secondary | ICD-10-CM | POA: Diagnosis not present

## 2019-06-26 DIAGNOSIS — D631 Anemia in chronic kidney disease: Secondary | ICD-10-CM | POA: Diagnosis not present

## 2019-06-26 DIAGNOSIS — K7689 Other specified diseases of liver: Secondary | ICD-10-CM | POA: Diagnosis not present

## 2019-06-27 ENCOUNTER — Ambulatory Visit (INDEPENDENT_AMBULATORY_CARE_PROVIDER_SITE_OTHER): Payer: Medicare Other | Admitting: Podiatry

## 2019-06-27 ENCOUNTER — Other Ambulatory Visit: Payer: Self-pay

## 2019-06-27 ENCOUNTER — Encounter: Payer: Self-pay | Admitting: Podiatry

## 2019-06-27 DIAGNOSIS — B351 Tinea unguium: Secondary | ICD-10-CM | POA: Diagnosis not present

## 2019-06-27 DIAGNOSIS — M79675 Pain in left toe(s): Secondary | ICD-10-CM

## 2019-06-27 DIAGNOSIS — L608 Other nail disorders: Secondary | ICD-10-CM | POA: Diagnosis not present

## 2019-06-27 DIAGNOSIS — M79674 Pain in right toe(s): Secondary | ICD-10-CM | POA: Diagnosis not present

## 2019-06-27 NOTE — Progress Notes (Signed)
Complaint:  Visit Type: Patient presents  to my office for  preventative foot care services. Complaint: Patient states" my nails have grown long and thick and become painful to walk and wear shoes" Patient has been diagnosed with CVA and ESRD. The patient presents for preventative foot care services. No changes to ROS  Podiatric Exam: Vascular: dorsalis pedis  are palpable bilateral.  Posterior tibial pulses are absent  B/L. Capillary return is immediate. Temperature gradient is WNL. Skin turgor WNL  Sensorium: Normal Semmes Weinstein monofilament test. Normal tactile sensation bilaterally. Nail Exam: Pt has thick disfigured discolored nails with subungual debris noted bilateral entire nail hallux through fifth toenails Ulcer Exam: There is no evidence of ulcer or pre-ulcerative changes or infection. Orthopedic Exam: Muscle tone and strength are WNL. No limitations in general ROM. No crepitus or effusions noted. Foot type and digits show no abnormalities. Bony prominences are unremarkable. Skin: No Porokeratosis. No infection or ulcers  Diagnosis:  Onychomycosis, , Pain in right toe, pain in left toes  Treatment & Plan Procedures and Treatment: Consent by patient was obtained for treatment procedures.   Debridement of mycotic and hypertrophic toenails, 1 through 5 bilateral and clearing of subungual debris. No ulceration, no infection noted.  Return Visit-Office Procedure: Patient instructed to return to the office for a follow up visit 3 months for continued evaluation and treatment.    Peighton Edgin DPM 

## 2019-06-28 DIAGNOSIS — N2581 Secondary hyperparathyroidism of renal origin: Secondary | ICD-10-CM | POA: Diagnosis not present

## 2019-06-28 DIAGNOSIS — E875 Hyperkalemia: Secondary | ICD-10-CM | POA: Diagnosis not present

## 2019-06-28 DIAGNOSIS — D631 Anemia in chronic kidney disease: Secondary | ICD-10-CM | POA: Diagnosis not present

## 2019-06-28 DIAGNOSIS — N186 End stage renal disease: Secondary | ICD-10-CM | POA: Diagnosis not present

## 2019-06-28 DIAGNOSIS — K7689 Other specified diseases of liver: Secondary | ICD-10-CM | POA: Diagnosis not present

## 2019-06-28 DIAGNOSIS — Z992 Dependence on renal dialysis: Secondary | ICD-10-CM | POA: Diagnosis not present

## 2019-06-30 DIAGNOSIS — D631 Anemia in chronic kidney disease: Secondary | ICD-10-CM | POA: Diagnosis not present

## 2019-06-30 DIAGNOSIS — N2581 Secondary hyperparathyroidism of renal origin: Secondary | ICD-10-CM | POA: Diagnosis not present

## 2019-06-30 DIAGNOSIS — E875 Hyperkalemia: Secondary | ICD-10-CM | POA: Diagnosis not present

## 2019-06-30 DIAGNOSIS — N186 End stage renal disease: Secondary | ICD-10-CM | POA: Diagnosis not present

## 2019-06-30 DIAGNOSIS — K7689 Other specified diseases of liver: Secondary | ICD-10-CM | POA: Diagnosis not present

## 2019-06-30 DIAGNOSIS — Z992 Dependence on renal dialysis: Secondary | ICD-10-CM | POA: Diagnosis not present

## 2019-07-01 DIAGNOSIS — K7689 Other specified diseases of liver: Secondary | ICD-10-CM | POA: Diagnosis not present

## 2019-07-01 DIAGNOSIS — Z992 Dependence on renal dialysis: Secondary | ICD-10-CM | POA: Diagnosis not present

## 2019-07-01 DIAGNOSIS — E875 Hyperkalemia: Secondary | ICD-10-CM | POA: Diagnosis not present

## 2019-07-01 DIAGNOSIS — N186 End stage renal disease: Secondary | ICD-10-CM | POA: Diagnosis not present

## 2019-07-01 DIAGNOSIS — N2581 Secondary hyperparathyroidism of renal origin: Secondary | ICD-10-CM | POA: Diagnosis not present

## 2019-07-01 DIAGNOSIS — D631 Anemia in chronic kidney disease: Secondary | ICD-10-CM | POA: Diagnosis not present

## 2019-07-03 DIAGNOSIS — N2581 Secondary hyperparathyroidism of renal origin: Secondary | ICD-10-CM | POA: Diagnosis not present

## 2019-07-03 DIAGNOSIS — E875 Hyperkalemia: Secondary | ICD-10-CM | POA: Diagnosis not present

## 2019-07-03 DIAGNOSIS — D631 Anemia in chronic kidney disease: Secondary | ICD-10-CM | POA: Diagnosis not present

## 2019-07-03 DIAGNOSIS — K7689 Other specified diseases of liver: Secondary | ICD-10-CM | POA: Diagnosis not present

## 2019-07-03 DIAGNOSIS — Z992 Dependence on renal dialysis: Secondary | ICD-10-CM | POA: Diagnosis not present

## 2019-07-03 DIAGNOSIS — N186 End stage renal disease: Secondary | ICD-10-CM | POA: Diagnosis not present

## 2019-07-05 DIAGNOSIS — D631 Anemia in chronic kidney disease: Secondary | ICD-10-CM | POA: Diagnosis not present

## 2019-07-05 DIAGNOSIS — E875 Hyperkalemia: Secondary | ICD-10-CM | POA: Diagnosis not present

## 2019-07-05 DIAGNOSIS — K7689 Other specified diseases of liver: Secondary | ICD-10-CM | POA: Diagnosis not present

## 2019-07-05 DIAGNOSIS — N2581 Secondary hyperparathyroidism of renal origin: Secondary | ICD-10-CM | POA: Diagnosis not present

## 2019-07-05 DIAGNOSIS — N186 End stage renal disease: Secondary | ICD-10-CM | POA: Diagnosis not present

## 2019-07-05 DIAGNOSIS — Z992 Dependence on renal dialysis: Secondary | ICD-10-CM | POA: Diagnosis not present

## 2019-07-07 DIAGNOSIS — N186 End stage renal disease: Secondary | ICD-10-CM | POA: Diagnosis not present

## 2019-07-07 DIAGNOSIS — N041 Nephrotic syndrome with focal and segmental glomerular lesions: Secondary | ICD-10-CM | POA: Diagnosis not present

## 2019-07-07 DIAGNOSIS — E440 Moderate protein-calorie malnutrition: Secondary | ICD-10-CM | POA: Diagnosis not present

## 2019-07-07 DIAGNOSIS — R17 Unspecified jaundice: Secondary | ICD-10-CM | POA: Diagnosis not present

## 2019-07-07 DIAGNOSIS — Z79899 Other long term (current) drug therapy: Secondary | ICD-10-CM | POA: Diagnosis not present

## 2019-07-07 DIAGNOSIS — Z5189 Encounter for other specified aftercare: Secondary | ICD-10-CM | POA: Diagnosis not present

## 2019-07-07 DIAGNOSIS — Z992 Dependence on renal dialysis: Secondary | ICD-10-CM | POA: Diagnosis not present

## 2019-07-07 DIAGNOSIS — E8770 Fluid overload, unspecified: Secondary | ICD-10-CM | POA: Diagnosis not present

## 2019-07-07 DIAGNOSIS — N2589 Other disorders resulting from impaired renal tubular function: Secondary | ICD-10-CM | POA: Diagnosis not present

## 2019-07-07 DIAGNOSIS — Z4931 Encounter for adequacy testing for hemodialysis: Secondary | ICD-10-CM | POA: Diagnosis not present

## 2019-07-07 DIAGNOSIS — D631 Anemia in chronic kidney disease: Secondary | ICD-10-CM | POA: Diagnosis not present

## 2019-07-07 DIAGNOSIS — E875 Hyperkalemia: Secondary | ICD-10-CM | POA: Diagnosis not present

## 2019-07-07 DIAGNOSIS — D509 Iron deficiency anemia, unspecified: Secondary | ICD-10-CM | POA: Diagnosis not present

## 2019-07-07 DIAGNOSIS — N2581 Secondary hyperparathyroidism of renal origin: Secondary | ICD-10-CM | POA: Diagnosis not present

## 2019-07-08 DIAGNOSIS — N2581 Secondary hyperparathyroidism of renal origin: Secondary | ICD-10-CM | POA: Diagnosis not present

## 2019-07-08 DIAGNOSIS — D631 Anemia in chronic kidney disease: Secondary | ICD-10-CM | POA: Diagnosis not present

## 2019-07-08 DIAGNOSIS — E875 Hyperkalemia: Secondary | ICD-10-CM | POA: Diagnosis not present

## 2019-07-08 DIAGNOSIS — Z4931 Encounter for adequacy testing for hemodialysis: Secondary | ICD-10-CM | POA: Diagnosis not present

## 2019-07-08 DIAGNOSIS — N186 End stage renal disease: Secondary | ICD-10-CM | POA: Diagnosis not present

## 2019-07-08 DIAGNOSIS — Z992 Dependence on renal dialysis: Secondary | ICD-10-CM | POA: Diagnosis not present

## 2019-07-10 DIAGNOSIS — N186 End stage renal disease: Secondary | ICD-10-CM | POA: Diagnosis not present

## 2019-07-10 DIAGNOSIS — D631 Anemia in chronic kidney disease: Secondary | ICD-10-CM | POA: Diagnosis not present

## 2019-07-10 DIAGNOSIS — E875 Hyperkalemia: Secondary | ICD-10-CM | POA: Diagnosis not present

## 2019-07-10 DIAGNOSIS — Z992 Dependence on renal dialysis: Secondary | ICD-10-CM | POA: Diagnosis not present

## 2019-07-10 DIAGNOSIS — N2581 Secondary hyperparathyroidism of renal origin: Secondary | ICD-10-CM | POA: Diagnosis not present

## 2019-07-10 DIAGNOSIS — Z4931 Encounter for adequacy testing for hemodialysis: Secondary | ICD-10-CM | POA: Diagnosis not present

## 2019-07-12 DIAGNOSIS — E875 Hyperkalemia: Secondary | ICD-10-CM | POA: Diagnosis not present

## 2019-07-12 DIAGNOSIS — N2581 Secondary hyperparathyroidism of renal origin: Secondary | ICD-10-CM | POA: Diagnosis not present

## 2019-07-12 DIAGNOSIS — Z4931 Encounter for adequacy testing for hemodialysis: Secondary | ICD-10-CM | POA: Diagnosis not present

## 2019-07-12 DIAGNOSIS — D631 Anemia in chronic kidney disease: Secondary | ICD-10-CM | POA: Diagnosis not present

## 2019-07-12 DIAGNOSIS — Z992 Dependence on renal dialysis: Secondary | ICD-10-CM | POA: Diagnosis not present

## 2019-07-12 DIAGNOSIS — N186 End stage renal disease: Secondary | ICD-10-CM | POA: Diagnosis not present

## 2019-07-13 ENCOUNTER — Telehealth: Payer: Self-pay

## 2019-07-13 DIAGNOSIS — E890 Postprocedural hypothyroidism: Secondary | ICD-10-CM | POA: Diagnosis not present

## 2019-07-13 DIAGNOSIS — N2581 Secondary hyperparathyroidism of renal origin: Secondary | ICD-10-CM | POA: Diagnosis not present

## 2019-07-13 DIAGNOSIS — D631 Anemia in chronic kidney disease: Secondary | ICD-10-CM | POA: Diagnosis not present

## 2019-07-13 DIAGNOSIS — Z992 Dependence on renal dialysis: Secondary | ICD-10-CM | POA: Diagnosis not present

## 2019-07-13 DIAGNOSIS — E875 Hyperkalemia: Secondary | ICD-10-CM | POA: Diagnosis not present

## 2019-07-13 DIAGNOSIS — E790 Hyperuricemia without signs of inflammatory arthritis and tophaceous disease: Secondary | ICD-10-CM | POA: Diagnosis not present

## 2019-07-13 DIAGNOSIS — E7849 Other hyperlipidemia: Secondary | ICD-10-CM | POA: Diagnosis not present

## 2019-07-13 DIAGNOSIS — Z4931 Encounter for adequacy testing for hemodialysis: Secondary | ICD-10-CM | POA: Diagnosis not present

## 2019-07-13 DIAGNOSIS — N186 End stage renal disease: Secondary | ICD-10-CM | POA: Diagnosis not present

## 2019-07-13 NOTE — Telephone Encounter (Signed)
Surgical clearance form has been signed by NP and faxed back to [facility]. Confirmation fax has been received.

## 2019-07-14 DIAGNOSIS — Z4931 Encounter for adequacy testing for hemodialysis: Secondary | ICD-10-CM | POA: Diagnosis not present

## 2019-07-14 DIAGNOSIS — D631 Anemia in chronic kidney disease: Secondary | ICD-10-CM | POA: Diagnosis not present

## 2019-07-14 DIAGNOSIS — N186 End stage renal disease: Secondary | ICD-10-CM | POA: Diagnosis not present

## 2019-07-14 DIAGNOSIS — N2581 Secondary hyperparathyroidism of renal origin: Secondary | ICD-10-CM | POA: Diagnosis not present

## 2019-07-14 DIAGNOSIS — E875 Hyperkalemia: Secondary | ICD-10-CM | POA: Diagnosis not present

## 2019-07-14 DIAGNOSIS — Z992 Dependence on renal dialysis: Secondary | ICD-10-CM | POA: Diagnosis not present

## 2019-07-15 DIAGNOSIS — E875 Hyperkalemia: Secondary | ICD-10-CM | POA: Diagnosis not present

## 2019-07-15 DIAGNOSIS — Z992 Dependence on renal dialysis: Secondary | ICD-10-CM | POA: Diagnosis not present

## 2019-07-15 DIAGNOSIS — Z4931 Encounter for adequacy testing for hemodialysis: Secondary | ICD-10-CM | POA: Diagnosis not present

## 2019-07-15 DIAGNOSIS — N2581 Secondary hyperparathyroidism of renal origin: Secondary | ICD-10-CM | POA: Diagnosis not present

## 2019-07-15 DIAGNOSIS — N186 End stage renal disease: Secondary | ICD-10-CM | POA: Diagnosis not present

## 2019-07-15 DIAGNOSIS — D631 Anemia in chronic kidney disease: Secondary | ICD-10-CM | POA: Diagnosis not present

## 2019-07-17 DIAGNOSIS — E875 Hyperkalemia: Secondary | ICD-10-CM | POA: Diagnosis not present

## 2019-07-17 DIAGNOSIS — N186 End stage renal disease: Secondary | ICD-10-CM | POA: Diagnosis not present

## 2019-07-17 DIAGNOSIS — Z992 Dependence on renal dialysis: Secondary | ICD-10-CM | POA: Diagnosis not present

## 2019-07-17 DIAGNOSIS — N2581 Secondary hyperparathyroidism of renal origin: Secondary | ICD-10-CM | POA: Diagnosis not present

## 2019-07-17 DIAGNOSIS — Z4931 Encounter for adequacy testing for hemodialysis: Secondary | ICD-10-CM | POA: Diagnosis not present

## 2019-07-17 DIAGNOSIS — D631 Anemia in chronic kidney disease: Secondary | ICD-10-CM | POA: Diagnosis not present

## 2019-07-18 DIAGNOSIS — H2512 Age-related nuclear cataract, left eye: Secondary | ICD-10-CM | POA: Diagnosis not present

## 2019-07-18 DIAGNOSIS — H25042 Posterior subcapsular polar age-related cataract, left eye: Secondary | ICD-10-CM | POA: Diagnosis not present

## 2019-07-18 DIAGNOSIS — H25012 Cortical age-related cataract, left eye: Secondary | ICD-10-CM | POA: Diagnosis not present

## 2019-07-19 DIAGNOSIS — Z4931 Encounter for adequacy testing for hemodialysis: Secondary | ICD-10-CM | POA: Diagnosis not present

## 2019-07-19 DIAGNOSIS — N186 End stage renal disease: Secondary | ICD-10-CM | POA: Diagnosis not present

## 2019-07-19 DIAGNOSIS — N2581 Secondary hyperparathyroidism of renal origin: Secondary | ICD-10-CM | POA: Diagnosis not present

## 2019-07-19 DIAGNOSIS — D631 Anemia in chronic kidney disease: Secondary | ICD-10-CM | POA: Diagnosis not present

## 2019-07-19 DIAGNOSIS — E875 Hyperkalemia: Secondary | ICD-10-CM | POA: Diagnosis not present

## 2019-07-19 DIAGNOSIS — Z992 Dependence on renal dialysis: Secondary | ICD-10-CM | POA: Diagnosis not present

## 2019-07-21 DIAGNOSIS — Z992 Dependence on renal dialysis: Secondary | ICD-10-CM | POA: Diagnosis not present

## 2019-07-21 DIAGNOSIS — Z4931 Encounter for adequacy testing for hemodialysis: Secondary | ICD-10-CM | POA: Diagnosis not present

## 2019-07-21 DIAGNOSIS — E875 Hyperkalemia: Secondary | ICD-10-CM | POA: Diagnosis not present

## 2019-07-21 DIAGNOSIS — N2581 Secondary hyperparathyroidism of renal origin: Secondary | ICD-10-CM | POA: Diagnosis not present

## 2019-07-21 DIAGNOSIS — N186 End stage renal disease: Secondary | ICD-10-CM | POA: Diagnosis not present

## 2019-07-21 DIAGNOSIS — D631 Anemia in chronic kidney disease: Secondary | ICD-10-CM | POA: Diagnosis not present

## 2019-07-22 DIAGNOSIS — E875 Hyperkalemia: Secondary | ICD-10-CM | POA: Diagnosis not present

## 2019-07-22 DIAGNOSIS — N186 End stage renal disease: Secondary | ICD-10-CM | POA: Diagnosis not present

## 2019-07-22 DIAGNOSIS — D631 Anemia in chronic kidney disease: Secondary | ICD-10-CM | POA: Diagnosis not present

## 2019-07-22 DIAGNOSIS — N2581 Secondary hyperparathyroidism of renal origin: Secondary | ICD-10-CM | POA: Diagnosis not present

## 2019-07-22 DIAGNOSIS — Z4931 Encounter for adequacy testing for hemodialysis: Secondary | ICD-10-CM | POA: Diagnosis not present

## 2019-07-22 DIAGNOSIS — Z992 Dependence on renal dialysis: Secondary | ICD-10-CM | POA: Diagnosis not present

## 2019-07-24 DIAGNOSIS — Z992 Dependence on renal dialysis: Secondary | ICD-10-CM | POA: Diagnosis not present

## 2019-07-24 DIAGNOSIS — E875 Hyperkalemia: Secondary | ICD-10-CM | POA: Diagnosis not present

## 2019-07-24 DIAGNOSIS — N2581 Secondary hyperparathyroidism of renal origin: Secondary | ICD-10-CM | POA: Diagnosis not present

## 2019-07-24 DIAGNOSIS — Z4931 Encounter for adequacy testing for hemodialysis: Secondary | ICD-10-CM | POA: Diagnosis not present

## 2019-07-24 DIAGNOSIS — D631 Anemia in chronic kidney disease: Secondary | ICD-10-CM | POA: Diagnosis not present

## 2019-07-24 DIAGNOSIS — N186 End stage renal disease: Secondary | ICD-10-CM | POA: Diagnosis not present

## 2019-07-25 DIAGNOSIS — H25812 Combined forms of age-related cataract, left eye: Secondary | ICD-10-CM | POA: Diagnosis not present

## 2019-07-25 DIAGNOSIS — H2512 Age-related nuclear cataract, left eye: Secondary | ICD-10-CM | POA: Diagnosis not present

## 2019-07-26 DIAGNOSIS — N186 End stage renal disease: Secondary | ICD-10-CM | POA: Diagnosis not present

## 2019-07-26 DIAGNOSIS — E875 Hyperkalemia: Secondary | ICD-10-CM | POA: Diagnosis not present

## 2019-07-26 DIAGNOSIS — Z992 Dependence on renal dialysis: Secondary | ICD-10-CM | POA: Diagnosis not present

## 2019-07-26 DIAGNOSIS — Z4931 Encounter for adequacy testing for hemodialysis: Secondary | ICD-10-CM | POA: Diagnosis not present

## 2019-07-26 DIAGNOSIS — D631 Anemia in chronic kidney disease: Secondary | ICD-10-CM | POA: Diagnosis not present

## 2019-07-26 DIAGNOSIS — N2581 Secondary hyperparathyroidism of renal origin: Secondary | ICD-10-CM | POA: Diagnosis not present

## 2019-07-28 DIAGNOSIS — Z992 Dependence on renal dialysis: Secondary | ICD-10-CM | POA: Diagnosis not present

## 2019-07-28 DIAGNOSIS — N186 End stage renal disease: Secondary | ICD-10-CM | POA: Diagnosis not present

## 2019-07-28 DIAGNOSIS — D631 Anemia in chronic kidney disease: Secondary | ICD-10-CM | POA: Diagnosis not present

## 2019-07-28 DIAGNOSIS — E875 Hyperkalemia: Secondary | ICD-10-CM | POA: Diagnosis not present

## 2019-07-28 DIAGNOSIS — N2581 Secondary hyperparathyroidism of renal origin: Secondary | ICD-10-CM | POA: Diagnosis not present

## 2019-07-28 DIAGNOSIS — Z4931 Encounter for adequacy testing for hemodialysis: Secondary | ICD-10-CM | POA: Diagnosis not present

## 2019-07-29 DIAGNOSIS — D631 Anemia in chronic kidney disease: Secondary | ICD-10-CM | POA: Diagnosis not present

## 2019-07-29 DIAGNOSIS — E875 Hyperkalemia: Secondary | ICD-10-CM | POA: Diagnosis not present

## 2019-07-29 DIAGNOSIS — Z4931 Encounter for adequacy testing for hemodialysis: Secondary | ICD-10-CM | POA: Diagnosis not present

## 2019-07-29 DIAGNOSIS — N2581 Secondary hyperparathyroidism of renal origin: Secondary | ICD-10-CM | POA: Diagnosis not present

## 2019-07-29 DIAGNOSIS — N186 End stage renal disease: Secondary | ICD-10-CM | POA: Diagnosis not present

## 2019-07-29 DIAGNOSIS — Z992 Dependence on renal dialysis: Secondary | ICD-10-CM | POA: Diagnosis not present

## 2019-07-31 DIAGNOSIS — N2581 Secondary hyperparathyroidism of renal origin: Secondary | ICD-10-CM | POA: Diagnosis not present

## 2019-07-31 DIAGNOSIS — I770 Arteriovenous fistula, acquired: Secondary | ICD-10-CM | POA: Insufficient documentation

## 2019-07-31 DIAGNOSIS — Z905 Acquired absence of kidney: Secondary | ICD-10-CM | POA: Insufficient documentation

## 2019-07-31 DIAGNOSIS — D631 Anemia in chronic kidney disease: Secondary | ICD-10-CM | POA: Diagnosis not present

## 2019-07-31 DIAGNOSIS — Z992 Dependence on renal dialysis: Secondary | ICD-10-CM | POA: Insufficient documentation

## 2019-07-31 DIAGNOSIS — N186 End stage renal disease: Secondary | ICD-10-CM | POA: Diagnosis not present

## 2019-07-31 DIAGNOSIS — Z4931 Encounter for adequacy testing for hemodialysis: Secondary | ICD-10-CM | POA: Diagnosis not present

## 2019-07-31 DIAGNOSIS — E875 Hyperkalemia: Secondary | ICD-10-CM | POA: Diagnosis not present

## 2019-08-02 DIAGNOSIS — D631 Anemia in chronic kidney disease: Secondary | ICD-10-CM | POA: Diagnosis not present

## 2019-08-02 DIAGNOSIS — Z4931 Encounter for adequacy testing for hemodialysis: Secondary | ICD-10-CM | POA: Diagnosis not present

## 2019-08-02 DIAGNOSIS — N2581 Secondary hyperparathyroidism of renal origin: Secondary | ICD-10-CM | POA: Diagnosis not present

## 2019-08-02 DIAGNOSIS — N186 End stage renal disease: Secondary | ICD-10-CM | POA: Diagnosis not present

## 2019-08-02 DIAGNOSIS — Z992 Dependence on renal dialysis: Secondary | ICD-10-CM | POA: Diagnosis not present

## 2019-08-02 DIAGNOSIS — E875 Hyperkalemia: Secondary | ICD-10-CM | POA: Diagnosis not present

## 2019-08-03 DIAGNOSIS — H40033 Anatomical narrow angle, bilateral: Secondary | ICD-10-CM | POA: Diagnosis not present

## 2019-08-03 DIAGNOSIS — H43393 Other vitreous opacities, bilateral: Secondary | ICD-10-CM | POA: Diagnosis not present

## 2019-08-04 DIAGNOSIS — E875 Hyperkalemia: Secondary | ICD-10-CM | POA: Diagnosis not present

## 2019-08-04 DIAGNOSIS — Z4931 Encounter for adequacy testing for hemodialysis: Secondary | ICD-10-CM | POA: Diagnosis not present

## 2019-08-04 DIAGNOSIS — N2581 Secondary hyperparathyroidism of renal origin: Secondary | ICD-10-CM | POA: Diagnosis not present

## 2019-08-04 DIAGNOSIS — N186 End stage renal disease: Secondary | ICD-10-CM | POA: Diagnosis not present

## 2019-08-04 DIAGNOSIS — D631 Anemia in chronic kidney disease: Secondary | ICD-10-CM | POA: Diagnosis not present

## 2019-08-04 DIAGNOSIS — Z992 Dependence on renal dialysis: Secondary | ICD-10-CM | POA: Diagnosis not present

## 2019-08-05 DIAGNOSIS — D631 Anemia in chronic kidney disease: Secondary | ICD-10-CM | POA: Diagnosis not present

## 2019-08-05 DIAGNOSIS — E875 Hyperkalemia: Secondary | ICD-10-CM | POA: Diagnosis not present

## 2019-08-05 DIAGNOSIS — Z992 Dependence on renal dialysis: Secondary | ICD-10-CM | POA: Diagnosis not present

## 2019-08-05 DIAGNOSIS — Z4931 Encounter for adequacy testing for hemodialysis: Secondary | ICD-10-CM | POA: Diagnosis not present

## 2019-08-05 DIAGNOSIS — N186 End stage renal disease: Secondary | ICD-10-CM | POA: Diagnosis not present

## 2019-08-05 DIAGNOSIS — N2581 Secondary hyperparathyroidism of renal origin: Secondary | ICD-10-CM | POA: Diagnosis not present

## 2019-08-06 DIAGNOSIS — Z992 Dependence on renal dialysis: Secondary | ICD-10-CM | POA: Diagnosis not present

## 2019-08-06 DIAGNOSIS — N186 End stage renal disease: Secondary | ICD-10-CM | POA: Diagnosis not present

## 2019-08-06 DIAGNOSIS — Z4931 Encounter for adequacy testing for hemodialysis: Secondary | ICD-10-CM | POA: Diagnosis not present

## 2019-08-06 DIAGNOSIS — N2581 Secondary hyperparathyroidism of renal origin: Secondary | ICD-10-CM | POA: Diagnosis not present

## 2019-08-06 DIAGNOSIS — E875 Hyperkalemia: Secondary | ICD-10-CM | POA: Diagnosis not present

## 2019-08-06 DIAGNOSIS — D631 Anemia in chronic kidney disease: Secondary | ICD-10-CM | POA: Diagnosis not present

## 2019-08-07 DIAGNOSIS — Z79899 Other long term (current) drug therapy: Secondary | ICD-10-CM | POA: Diagnosis not present

## 2019-08-07 DIAGNOSIS — Z4931 Encounter for adequacy testing for hemodialysis: Secondary | ICD-10-CM | POA: Diagnosis not present

## 2019-08-07 DIAGNOSIS — R17 Unspecified jaundice: Secondary | ICD-10-CM | POA: Diagnosis not present

## 2019-08-07 DIAGNOSIS — N186 End stage renal disease: Secondary | ICD-10-CM | POA: Diagnosis not present

## 2019-08-07 DIAGNOSIS — E8770 Fluid overload, unspecified: Secondary | ICD-10-CM | POA: Diagnosis not present

## 2019-08-07 DIAGNOSIS — E875 Hyperkalemia: Secondary | ICD-10-CM | POA: Diagnosis not present

## 2019-08-07 DIAGNOSIS — E440 Moderate protein-calorie malnutrition: Secondary | ICD-10-CM | POA: Diagnosis not present

## 2019-08-07 DIAGNOSIS — D509 Iron deficiency anemia, unspecified: Secondary | ICD-10-CM | POA: Diagnosis not present

## 2019-08-07 DIAGNOSIS — Z992 Dependence on renal dialysis: Secondary | ICD-10-CM | POA: Diagnosis not present

## 2019-08-07 DIAGNOSIS — Z5189 Encounter for other specified aftercare: Secondary | ICD-10-CM | POA: Diagnosis not present

## 2019-08-07 DIAGNOSIS — N041 Nephrotic syndrome with focal and segmental glomerular lesions: Secondary | ICD-10-CM | POA: Diagnosis not present

## 2019-08-07 DIAGNOSIS — N2581 Secondary hyperparathyroidism of renal origin: Secondary | ICD-10-CM | POA: Diagnosis not present

## 2019-08-08 DIAGNOSIS — E790 Hyperuricemia without signs of inflammatory arthritis and tophaceous disease: Secondary | ICD-10-CM | POA: Diagnosis not present

## 2019-08-08 DIAGNOSIS — Z992 Dependence on renal dialysis: Secondary | ICD-10-CM | POA: Diagnosis not present

## 2019-08-08 DIAGNOSIS — R17 Unspecified jaundice: Secondary | ICD-10-CM | POA: Diagnosis not present

## 2019-08-08 DIAGNOSIS — N2581 Secondary hyperparathyroidism of renal origin: Secondary | ICD-10-CM | POA: Diagnosis not present

## 2019-08-08 DIAGNOSIS — N186 End stage renal disease: Secondary | ICD-10-CM | POA: Diagnosis not present

## 2019-08-08 DIAGNOSIS — E890 Postprocedural hypothyroidism: Secondary | ICD-10-CM | POA: Diagnosis not present

## 2019-08-08 DIAGNOSIS — Z79899 Other long term (current) drug therapy: Secondary | ICD-10-CM | POA: Diagnosis not present

## 2019-08-08 DIAGNOSIS — D509 Iron deficiency anemia, unspecified: Secondary | ICD-10-CM | POA: Diagnosis not present

## 2019-08-09 DIAGNOSIS — R17 Unspecified jaundice: Secondary | ICD-10-CM | POA: Diagnosis not present

## 2019-08-09 DIAGNOSIS — Z992 Dependence on renal dialysis: Secondary | ICD-10-CM | POA: Diagnosis not present

## 2019-08-09 DIAGNOSIS — N186 End stage renal disease: Secondary | ICD-10-CM | POA: Diagnosis not present

## 2019-08-09 DIAGNOSIS — D509 Iron deficiency anemia, unspecified: Secondary | ICD-10-CM | POA: Diagnosis not present

## 2019-08-09 DIAGNOSIS — N2581 Secondary hyperparathyroidism of renal origin: Secondary | ICD-10-CM | POA: Diagnosis not present

## 2019-08-09 DIAGNOSIS — Z79899 Other long term (current) drug therapy: Secondary | ICD-10-CM | POA: Diagnosis not present

## 2019-08-10 DIAGNOSIS — D509 Iron deficiency anemia, unspecified: Secondary | ICD-10-CM | POA: Diagnosis not present

## 2019-08-10 DIAGNOSIS — R17 Unspecified jaundice: Secondary | ICD-10-CM | POA: Diagnosis not present

## 2019-08-10 DIAGNOSIS — N186 End stage renal disease: Secondary | ICD-10-CM | POA: Diagnosis not present

## 2019-08-10 DIAGNOSIS — Z79899 Other long term (current) drug therapy: Secondary | ICD-10-CM | POA: Diagnosis not present

## 2019-08-10 DIAGNOSIS — Z992 Dependence on renal dialysis: Secondary | ICD-10-CM | POA: Diagnosis not present

## 2019-08-10 DIAGNOSIS — N2581 Secondary hyperparathyroidism of renal origin: Secondary | ICD-10-CM | POA: Diagnosis not present

## 2019-08-11 DIAGNOSIS — N186 End stage renal disease: Secondary | ICD-10-CM | POA: Diagnosis not present

## 2019-08-11 DIAGNOSIS — N2581 Secondary hyperparathyroidism of renal origin: Secondary | ICD-10-CM | POA: Diagnosis not present

## 2019-08-11 DIAGNOSIS — R17 Unspecified jaundice: Secondary | ICD-10-CM | POA: Diagnosis not present

## 2019-08-11 DIAGNOSIS — Z79899 Other long term (current) drug therapy: Secondary | ICD-10-CM | POA: Diagnosis not present

## 2019-08-11 DIAGNOSIS — D509 Iron deficiency anemia, unspecified: Secondary | ICD-10-CM | POA: Diagnosis not present

## 2019-08-11 DIAGNOSIS — Z992 Dependence on renal dialysis: Secondary | ICD-10-CM | POA: Diagnosis not present

## 2019-08-12 DIAGNOSIS — R17 Unspecified jaundice: Secondary | ICD-10-CM | POA: Diagnosis not present

## 2019-08-12 DIAGNOSIS — N2581 Secondary hyperparathyroidism of renal origin: Secondary | ICD-10-CM | POA: Diagnosis not present

## 2019-08-12 DIAGNOSIS — N186 End stage renal disease: Secondary | ICD-10-CM | POA: Diagnosis not present

## 2019-08-12 DIAGNOSIS — D509 Iron deficiency anemia, unspecified: Secondary | ICD-10-CM | POA: Diagnosis not present

## 2019-08-12 DIAGNOSIS — Z992 Dependence on renal dialysis: Secondary | ICD-10-CM | POA: Diagnosis not present

## 2019-08-12 DIAGNOSIS — Z79899 Other long term (current) drug therapy: Secondary | ICD-10-CM | POA: Diagnosis not present

## 2019-08-13 DIAGNOSIS — N2581 Secondary hyperparathyroidism of renal origin: Secondary | ICD-10-CM | POA: Diagnosis not present

## 2019-08-13 DIAGNOSIS — N186 End stage renal disease: Secondary | ICD-10-CM | POA: Diagnosis not present

## 2019-08-13 DIAGNOSIS — Z79899 Other long term (current) drug therapy: Secondary | ICD-10-CM | POA: Diagnosis not present

## 2019-08-13 DIAGNOSIS — D509 Iron deficiency anemia, unspecified: Secondary | ICD-10-CM | POA: Diagnosis not present

## 2019-08-13 DIAGNOSIS — R17 Unspecified jaundice: Secondary | ICD-10-CM | POA: Diagnosis not present

## 2019-08-13 DIAGNOSIS — Z992 Dependence on renal dialysis: Secondary | ICD-10-CM | POA: Diagnosis not present

## 2019-08-14 DIAGNOSIS — D509 Iron deficiency anemia, unspecified: Secondary | ICD-10-CM | POA: Diagnosis not present

## 2019-08-14 DIAGNOSIS — R17 Unspecified jaundice: Secondary | ICD-10-CM | POA: Diagnosis not present

## 2019-08-14 DIAGNOSIS — Z992 Dependence on renal dialysis: Secondary | ICD-10-CM | POA: Diagnosis not present

## 2019-08-14 DIAGNOSIS — Z79899 Other long term (current) drug therapy: Secondary | ICD-10-CM | POA: Diagnosis not present

## 2019-08-14 DIAGNOSIS — N2581 Secondary hyperparathyroidism of renal origin: Secondary | ICD-10-CM | POA: Diagnosis not present

## 2019-08-14 DIAGNOSIS — N186 End stage renal disease: Secondary | ICD-10-CM | POA: Diagnosis not present

## 2019-08-15 DIAGNOSIS — N2581 Secondary hyperparathyroidism of renal origin: Secondary | ICD-10-CM | POA: Diagnosis not present

## 2019-08-15 DIAGNOSIS — Z79899 Other long term (current) drug therapy: Secondary | ICD-10-CM | POA: Diagnosis not present

## 2019-08-15 DIAGNOSIS — D509 Iron deficiency anemia, unspecified: Secondary | ICD-10-CM | POA: Diagnosis not present

## 2019-08-15 DIAGNOSIS — N186 End stage renal disease: Secondary | ICD-10-CM | POA: Diagnosis not present

## 2019-08-15 DIAGNOSIS — Z992 Dependence on renal dialysis: Secondary | ICD-10-CM | POA: Diagnosis not present

## 2019-08-15 DIAGNOSIS — R17 Unspecified jaundice: Secondary | ICD-10-CM | POA: Diagnosis not present

## 2019-08-16 DIAGNOSIS — N186 End stage renal disease: Secondary | ICD-10-CM | POA: Diagnosis not present

## 2019-08-16 DIAGNOSIS — D509 Iron deficiency anemia, unspecified: Secondary | ICD-10-CM | POA: Diagnosis not present

## 2019-08-16 DIAGNOSIS — R17 Unspecified jaundice: Secondary | ICD-10-CM | POA: Diagnosis not present

## 2019-08-16 DIAGNOSIS — N2581 Secondary hyperparathyroidism of renal origin: Secondary | ICD-10-CM | POA: Diagnosis not present

## 2019-08-16 DIAGNOSIS — Z79899 Other long term (current) drug therapy: Secondary | ICD-10-CM | POA: Diagnosis not present

## 2019-08-16 DIAGNOSIS — Z992 Dependence on renal dialysis: Secondary | ICD-10-CM | POA: Diagnosis not present

## 2019-08-17 DIAGNOSIS — N186 End stage renal disease: Secondary | ICD-10-CM | POA: Diagnosis not present

## 2019-08-17 DIAGNOSIS — Z992 Dependence on renal dialysis: Secondary | ICD-10-CM | POA: Diagnosis not present

## 2019-08-17 DIAGNOSIS — N2581 Secondary hyperparathyroidism of renal origin: Secondary | ICD-10-CM | POA: Diagnosis not present

## 2019-08-17 DIAGNOSIS — R17 Unspecified jaundice: Secondary | ICD-10-CM | POA: Diagnosis not present

## 2019-08-17 DIAGNOSIS — Z79899 Other long term (current) drug therapy: Secondary | ICD-10-CM | POA: Diagnosis not present

## 2019-08-17 DIAGNOSIS — D509 Iron deficiency anemia, unspecified: Secondary | ICD-10-CM | POA: Diagnosis not present

## 2019-08-18 DIAGNOSIS — N186 End stage renal disease: Secondary | ICD-10-CM | POA: Diagnosis not present

## 2019-08-18 DIAGNOSIS — R17 Unspecified jaundice: Secondary | ICD-10-CM | POA: Diagnosis not present

## 2019-08-18 DIAGNOSIS — Z992 Dependence on renal dialysis: Secondary | ICD-10-CM | POA: Diagnosis not present

## 2019-08-18 DIAGNOSIS — D509 Iron deficiency anemia, unspecified: Secondary | ICD-10-CM | POA: Diagnosis not present

## 2019-08-18 DIAGNOSIS — N2581 Secondary hyperparathyroidism of renal origin: Secondary | ICD-10-CM | POA: Diagnosis not present

## 2019-08-18 DIAGNOSIS — Z79899 Other long term (current) drug therapy: Secondary | ICD-10-CM | POA: Diagnosis not present

## 2019-08-19 DIAGNOSIS — Z992 Dependence on renal dialysis: Secondary | ICD-10-CM | POA: Diagnosis not present

## 2019-08-19 DIAGNOSIS — N2581 Secondary hyperparathyroidism of renal origin: Secondary | ICD-10-CM | POA: Diagnosis not present

## 2019-08-19 DIAGNOSIS — D509 Iron deficiency anemia, unspecified: Secondary | ICD-10-CM | POA: Diagnosis not present

## 2019-08-19 DIAGNOSIS — R17 Unspecified jaundice: Secondary | ICD-10-CM | POA: Diagnosis not present

## 2019-08-19 DIAGNOSIS — N186 End stage renal disease: Secondary | ICD-10-CM | POA: Diagnosis not present

## 2019-08-19 DIAGNOSIS — Z79899 Other long term (current) drug therapy: Secondary | ICD-10-CM | POA: Diagnosis not present

## 2019-08-20 DIAGNOSIS — Z992 Dependence on renal dialysis: Secondary | ICD-10-CM | POA: Diagnosis not present

## 2019-08-20 DIAGNOSIS — D509 Iron deficiency anemia, unspecified: Secondary | ICD-10-CM | POA: Diagnosis not present

## 2019-08-20 DIAGNOSIS — R17 Unspecified jaundice: Secondary | ICD-10-CM | POA: Diagnosis not present

## 2019-08-20 DIAGNOSIS — N2581 Secondary hyperparathyroidism of renal origin: Secondary | ICD-10-CM | POA: Diagnosis not present

## 2019-08-20 DIAGNOSIS — Z79899 Other long term (current) drug therapy: Secondary | ICD-10-CM | POA: Diagnosis not present

## 2019-08-20 DIAGNOSIS — N186 End stage renal disease: Secondary | ICD-10-CM | POA: Diagnosis not present

## 2019-08-21 DIAGNOSIS — R17 Unspecified jaundice: Secondary | ICD-10-CM | POA: Diagnosis not present

## 2019-08-21 DIAGNOSIS — Z992 Dependence on renal dialysis: Secondary | ICD-10-CM | POA: Diagnosis not present

## 2019-08-21 DIAGNOSIS — Z79899 Other long term (current) drug therapy: Secondary | ICD-10-CM | POA: Diagnosis not present

## 2019-08-21 DIAGNOSIS — D509 Iron deficiency anemia, unspecified: Secondary | ICD-10-CM | POA: Diagnosis not present

## 2019-08-21 DIAGNOSIS — N2581 Secondary hyperparathyroidism of renal origin: Secondary | ICD-10-CM | POA: Diagnosis not present

## 2019-08-21 DIAGNOSIS — N186 End stage renal disease: Secondary | ICD-10-CM | POA: Diagnosis not present

## 2019-08-22 DIAGNOSIS — Z992 Dependence on renal dialysis: Secondary | ICD-10-CM | POA: Diagnosis not present

## 2019-08-22 DIAGNOSIS — D509 Iron deficiency anemia, unspecified: Secondary | ICD-10-CM | POA: Diagnosis not present

## 2019-08-22 DIAGNOSIS — R17 Unspecified jaundice: Secondary | ICD-10-CM | POA: Diagnosis not present

## 2019-08-22 DIAGNOSIS — N2581 Secondary hyperparathyroidism of renal origin: Secondary | ICD-10-CM | POA: Diagnosis not present

## 2019-08-22 DIAGNOSIS — Z79899 Other long term (current) drug therapy: Secondary | ICD-10-CM | POA: Diagnosis not present

## 2019-08-22 DIAGNOSIS — N186 End stage renal disease: Secondary | ICD-10-CM | POA: Diagnosis not present

## 2019-08-23 DIAGNOSIS — D509 Iron deficiency anemia, unspecified: Secondary | ICD-10-CM | POA: Diagnosis not present

## 2019-08-23 DIAGNOSIS — N186 End stage renal disease: Secondary | ICD-10-CM | POA: Diagnosis not present

## 2019-08-23 DIAGNOSIS — Z992 Dependence on renal dialysis: Secondary | ICD-10-CM | POA: Diagnosis not present

## 2019-08-23 DIAGNOSIS — N2581 Secondary hyperparathyroidism of renal origin: Secondary | ICD-10-CM | POA: Diagnosis not present

## 2019-08-23 DIAGNOSIS — R17 Unspecified jaundice: Secondary | ICD-10-CM | POA: Diagnosis not present

## 2019-08-23 DIAGNOSIS — Z79899 Other long term (current) drug therapy: Secondary | ICD-10-CM | POA: Diagnosis not present

## 2019-08-25 DIAGNOSIS — Z992 Dependence on renal dialysis: Secondary | ICD-10-CM | POA: Diagnosis not present

## 2019-08-25 DIAGNOSIS — Z79899 Other long term (current) drug therapy: Secondary | ICD-10-CM | POA: Diagnosis not present

## 2019-08-25 DIAGNOSIS — N2581 Secondary hyperparathyroidism of renal origin: Secondary | ICD-10-CM | POA: Diagnosis not present

## 2019-08-25 DIAGNOSIS — D509 Iron deficiency anemia, unspecified: Secondary | ICD-10-CM | POA: Diagnosis not present

## 2019-08-25 DIAGNOSIS — N186 End stage renal disease: Secondary | ICD-10-CM | POA: Diagnosis not present

## 2019-08-25 DIAGNOSIS — R17 Unspecified jaundice: Secondary | ICD-10-CM | POA: Diagnosis not present

## 2019-08-26 DIAGNOSIS — N2581 Secondary hyperparathyroidism of renal origin: Secondary | ICD-10-CM | POA: Diagnosis not present

## 2019-08-26 DIAGNOSIS — R17 Unspecified jaundice: Secondary | ICD-10-CM | POA: Diagnosis not present

## 2019-08-26 DIAGNOSIS — Z992 Dependence on renal dialysis: Secondary | ICD-10-CM | POA: Diagnosis not present

## 2019-08-26 DIAGNOSIS — N186 End stage renal disease: Secondary | ICD-10-CM | POA: Diagnosis not present

## 2019-08-26 DIAGNOSIS — D509 Iron deficiency anemia, unspecified: Secondary | ICD-10-CM | POA: Diagnosis not present

## 2019-08-26 DIAGNOSIS — Z79899 Other long term (current) drug therapy: Secondary | ICD-10-CM | POA: Diagnosis not present

## 2019-08-28 DIAGNOSIS — N2581 Secondary hyperparathyroidism of renal origin: Secondary | ICD-10-CM | POA: Diagnosis not present

## 2019-08-28 DIAGNOSIS — R17 Unspecified jaundice: Secondary | ICD-10-CM | POA: Diagnosis not present

## 2019-08-28 DIAGNOSIS — N186 End stage renal disease: Secondary | ICD-10-CM | POA: Diagnosis not present

## 2019-08-28 DIAGNOSIS — D509 Iron deficiency anemia, unspecified: Secondary | ICD-10-CM | POA: Diagnosis not present

## 2019-08-28 DIAGNOSIS — Z992 Dependence on renal dialysis: Secondary | ICD-10-CM | POA: Diagnosis not present

## 2019-08-28 DIAGNOSIS — Z79899 Other long term (current) drug therapy: Secondary | ICD-10-CM | POA: Diagnosis not present

## 2019-08-30 DIAGNOSIS — Z992 Dependence on renal dialysis: Secondary | ICD-10-CM | POA: Diagnosis not present

## 2019-08-30 DIAGNOSIS — Z79899 Other long term (current) drug therapy: Secondary | ICD-10-CM | POA: Diagnosis not present

## 2019-08-30 DIAGNOSIS — N2581 Secondary hyperparathyroidism of renal origin: Secondary | ICD-10-CM | POA: Diagnosis not present

## 2019-08-30 DIAGNOSIS — N186 End stage renal disease: Secondary | ICD-10-CM | POA: Diagnosis not present

## 2019-08-30 DIAGNOSIS — D509 Iron deficiency anemia, unspecified: Secondary | ICD-10-CM | POA: Diagnosis not present

## 2019-08-30 DIAGNOSIS — R17 Unspecified jaundice: Secondary | ICD-10-CM | POA: Diagnosis not present

## 2019-09-01 DIAGNOSIS — R17 Unspecified jaundice: Secondary | ICD-10-CM | POA: Diagnosis not present

## 2019-09-01 DIAGNOSIS — N186 End stage renal disease: Secondary | ICD-10-CM | POA: Diagnosis not present

## 2019-09-01 DIAGNOSIS — D509 Iron deficiency anemia, unspecified: Secondary | ICD-10-CM | POA: Diagnosis not present

## 2019-09-01 DIAGNOSIS — Z992 Dependence on renal dialysis: Secondary | ICD-10-CM | POA: Diagnosis not present

## 2019-09-01 DIAGNOSIS — N2581 Secondary hyperparathyroidism of renal origin: Secondary | ICD-10-CM | POA: Diagnosis not present

## 2019-09-01 DIAGNOSIS — Z79899 Other long term (current) drug therapy: Secondary | ICD-10-CM | POA: Diagnosis not present

## 2019-09-02 DIAGNOSIS — Z992 Dependence on renal dialysis: Secondary | ICD-10-CM | POA: Diagnosis not present

## 2019-09-02 DIAGNOSIS — Z79899 Other long term (current) drug therapy: Secondary | ICD-10-CM | POA: Diagnosis not present

## 2019-09-02 DIAGNOSIS — D509 Iron deficiency anemia, unspecified: Secondary | ICD-10-CM | POA: Diagnosis not present

## 2019-09-02 DIAGNOSIS — R17 Unspecified jaundice: Secondary | ICD-10-CM | POA: Diagnosis not present

## 2019-09-02 DIAGNOSIS — N2581 Secondary hyperparathyroidism of renal origin: Secondary | ICD-10-CM | POA: Diagnosis not present

## 2019-09-02 DIAGNOSIS — N186 End stage renal disease: Secondary | ICD-10-CM | POA: Diagnosis not present

## 2019-09-04 DIAGNOSIS — E875 Hyperkalemia: Secondary | ICD-10-CM | POA: Diagnosis not present

## 2019-09-04 DIAGNOSIS — N186 End stage renal disease: Secondary | ICD-10-CM | POA: Diagnosis not present

## 2019-09-04 DIAGNOSIS — D631 Anemia in chronic kidney disease: Secondary | ICD-10-CM | POA: Diagnosis not present

## 2019-09-04 DIAGNOSIS — R17 Unspecified jaundice: Secondary | ICD-10-CM | POA: Diagnosis not present

## 2019-09-04 DIAGNOSIS — D509 Iron deficiency anemia, unspecified: Secondary | ICD-10-CM | POA: Diagnosis not present

## 2019-09-04 DIAGNOSIS — Z992 Dependence on renal dialysis: Secondary | ICD-10-CM | POA: Diagnosis not present

## 2019-09-04 DIAGNOSIS — N2581 Secondary hyperparathyroidism of renal origin: Secondary | ICD-10-CM | POA: Diagnosis not present

## 2019-09-04 DIAGNOSIS — N041 Nephrotic syndrome with focal and segmental glomerular lesions: Secondary | ICD-10-CM | POA: Diagnosis not present

## 2019-09-04 DIAGNOSIS — E440 Moderate protein-calorie malnutrition: Secondary | ICD-10-CM | POA: Diagnosis not present

## 2019-09-04 DIAGNOSIS — E8770 Fluid overload, unspecified: Secondary | ICD-10-CM | POA: Diagnosis not present

## 2019-09-04 DIAGNOSIS — Z79899 Other long term (current) drug therapy: Secondary | ICD-10-CM | POA: Diagnosis not present

## 2019-09-04 DIAGNOSIS — Z4931 Encounter for adequacy testing for hemodialysis: Secondary | ICD-10-CM | POA: Diagnosis not present

## 2019-09-04 DIAGNOSIS — Z5189 Encounter for other specified aftercare: Secondary | ICD-10-CM | POA: Diagnosis not present

## 2019-09-04 DIAGNOSIS — Z23 Encounter for immunization: Secondary | ICD-10-CM | POA: Diagnosis not present

## 2019-09-06 DIAGNOSIS — D509 Iron deficiency anemia, unspecified: Secondary | ICD-10-CM | POA: Diagnosis not present

## 2019-09-06 DIAGNOSIS — N186 End stage renal disease: Secondary | ICD-10-CM | POA: Diagnosis not present

## 2019-09-06 DIAGNOSIS — E790 Hyperuricemia without signs of inflammatory arthritis and tophaceous disease: Secondary | ICD-10-CM | POA: Diagnosis not present

## 2019-09-06 DIAGNOSIS — E875 Hyperkalemia: Secondary | ICD-10-CM | POA: Diagnosis not present

## 2019-09-06 DIAGNOSIS — R17 Unspecified jaundice: Secondary | ICD-10-CM | POA: Diagnosis not present

## 2019-09-06 DIAGNOSIS — D631 Anemia in chronic kidney disease: Secondary | ICD-10-CM | POA: Diagnosis not present

## 2019-09-06 DIAGNOSIS — E890 Postprocedural hypothyroidism: Secondary | ICD-10-CM | POA: Diagnosis not present

## 2019-09-06 DIAGNOSIS — Z992 Dependence on renal dialysis: Secondary | ICD-10-CM | POA: Diagnosis not present

## 2019-09-08 DIAGNOSIS — D509 Iron deficiency anemia, unspecified: Secondary | ICD-10-CM | POA: Diagnosis not present

## 2019-09-08 DIAGNOSIS — R17 Unspecified jaundice: Secondary | ICD-10-CM | POA: Diagnosis not present

## 2019-09-08 DIAGNOSIS — N186 End stage renal disease: Secondary | ICD-10-CM | POA: Diagnosis not present

## 2019-09-08 DIAGNOSIS — D631 Anemia in chronic kidney disease: Secondary | ICD-10-CM | POA: Diagnosis not present

## 2019-09-08 DIAGNOSIS — E875 Hyperkalemia: Secondary | ICD-10-CM | POA: Diagnosis not present

## 2019-09-08 DIAGNOSIS — Z992 Dependence on renal dialysis: Secondary | ICD-10-CM | POA: Diagnosis not present

## 2019-09-09 DIAGNOSIS — E875 Hyperkalemia: Secondary | ICD-10-CM | POA: Diagnosis not present

## 2019-09-09 DIAGNOSIS — Z992 Dependence on renal dialysis: Secondary | ICD-10-CM | POA: Diagnosis not present

## 2019-09-09 DIAGNOSIS — D631 Anemia in chronic kidney disease: Secondary | ICD-10-CM | POA: Diagnosis not present

## 2019-09-09 DIAGNOSIS — R17 Unspecified jaundice: Secondary | ICD-10-CM | POA: Diagnosis not present

## 2019-09-09 DIAGNOSIS — N186 End stage renal disease: Secondary | ICD-10-CM | POA: Diagnosis not present

## 2019-09-09 DIAGNOSIS — D509 Iron deficiency anemia, unspecified: Secondary | ICD-10-CM | POA: Diagnosis not present

## 2019-09-11 DIAGNOSIS — Z992 Dependence on renal dialysis: Secondary | ICD-10-CM | POA: Diagnosis not present

## 2019-09-11 DIAGNOSIS — D631 Anemia in chronic kidney disease: Secondary | ICD-10-CM | POA: Diagnosis not present

## 2019-09-11 DIAGNOSIS — D509 Iron deficiency anemia, unspecified: Secondary | ICD-10-CM | POA: Diagnosis not present

## 2019-09-11 DIAGNOSIS — N186 End stage renal disease: Secondary | ICD-10-CM | POA: Diagnosis not present

## 2019-09-11 DIAGNOSIS — R17 Unspecified jaundice: Secondary | ICD-10-CM | POA: Diagnosis not present

## 2019-09-11 DIAGNOSIS — E875 Hyperkalemia: Secondary | ICD-10-CM | POA: Diagnosis not present

## 2019-09-13 DIAGNOSIS — N186 End stage renal disease: Secondary | ICD-10-CM | POA: Diagnosis not present

## 2019-09-13 DIAGNOSIS — Z992 Dependence on renal dialysis: Secondary | ICD-10-CM | POA: Diagnosis not present

## 2019-09-13 DIAGNOSIS — D509 Iron deficiency anemia, unspecified: Secondary | ICD-10-CM | POA: Diagnosis not present

## 2019-09-13 DIAGNOSIS — E875 Hyperkalemia: Secondary | ICD-10-CM | POA: Diagnosis not present

## 2019-09-13 DIAGNOSIS — R17 Unspecified jaundice: Secondary | ICD-10-CM | POA: Diagnosis not present

## 2019-09-13 DIAGNOSIS — D631 Anemia in chronic kidney disease: Secondary | ICD-10-CM | POA: Diagnosis not present

## 2019-09-15 DIAGNOSIS — D509 Iron deficiency anemia, unspecified: Secondary | ICD-10-CM | POA: Diagnosis not present

## 2019-09-15 DIAGNOSIS — D631 Anemia in chronic kidney disease: Secondary | ICD-10-CM | POA: Diagnosis not present

## 2019-09-15 DIAGNOSIS — R17 Unspecified jaundice: Secondary | ICD-10-CM | POA: Diagnosis not present

## 2019-09-15 DIAGNOSIS — Z992 Dependence on renal dialysis: Secondary | ICD-10-CM | POA: Diagnosis not present

## 2019-09-15 DIAGNOSIS — E875 Hyperkalemia: Secondary | ICD-10-CM | POA: Diagnosis not present

## 2019-09-15 DIAGNOSIS — N186 End stage renal disease: Secondary | ICD-10-CM | POA: Diagnosis not present

## 2019-09-16 DIAGNOSIS — D509 Iron deficiency anemia, unspecified: Secondary | ICD-10-CM | POA: Diagnosis not present

## 2019-09-16 DIAGNOSIS — N186 End stage renal disease: Secondary | ICD-10-CM | POA: Diagnosis not present

## 2019-09-16 DIAGNOSIS — R17 Unspecified jaundice: Secondary | ICD-10-CM | POA: Diagnosis not present

## 2019-09-16 DIAGNOSIS — Z992 Dependence on renal dialysis: Secondary | ICD-10-CM | POA: Diagnosis not present

## 2019-09-16 DIAGNOSIS — D631 Anemia in chronic kidney disease: Secondary | ICD-10-CM | POA: Diagnosis not present

## 2019-09-16 DIAGNOSIS — E875 Hyperkalemia: Secondary | ICD-10-CM | POA: Diagnosis not present

## 2019-09-18 DIAGNOSIS — D509 Iron deficiency anemia, unspecified: Secondary | ICD-10-CM | POA: Diagnosis not present

## 2019-09-18 DIAGNOSIS — E875 Hyperkalemia: Secondary | ICD-10-CM | POA: Diagnosis not present

## 2019-09-18 DIAGNOSIS — D631 Anemia in chronic kidney disease: Secondary | ICD-10-CM | POA: Diagnosis not present

## 2019-09-18 DIAGNOSIS — R17 Unspecified jaundice: Secondary | ICD-10-CM | POA: Diagnosis not present

## 2019-09-18 DIAGNOSIS — Z992 Dependence on renal dialysis: Secondary | ICD-10-CM | POA: Diagnosis not present

## 2019-09-18 DIAGNOSIS — N186 End stage renal disease: Secondary | ICD-10-CM | POA: Diagnosis not present

## 2019-09-20 DIAGNOSIS — Z992 Dependence on renal dialysis: Secondary | ICD-10-CM | POA: Diagnosis not present

## 2019-09-20 DIAGNOSIS — E875 Hyperkalemia: Secondary | ICD-10-CM | POA: Diagnosis not present

## 2019-09-20 DIAGNOSIS — R17 Unspecified jaundice: Secondary | ICD-10-CM | POA: Diagnosis not present

## 2019-09-20 DIAGNOSIS — D509 Iron deficiency anemia, unspecified: Secondary | ICD-10-CM | POA: Diagnosis not present

## 2019-09-20 DIAGNOSIS — N186 End stage renal disease: Secondary | ICD-10-CM | POA: Diagnosis not present

## 2019-09-20 DIAGNOSIS — D631 Anemia in chronic kidney disease: Secondary | ICD-10-CM | POA: Diagnosis not present

## 2019-09-22 DIAGNOSIS — E875 Hyperkalemia: Secondary | ICD-10-CM | POA: Diagnosis not present

## 2019-09-22 DIAGNOSIS — D631 Anemia in chronic kidney disease: Secondary | ICD-10-CM | POA: Diagnosis not present

## 2019-09-22 DIAGNOSIS — Z992 Dependence on renal dialysis: Secondary | ICD-10-CM | POA: Diagnosis not present

## 2019-09-22 DIAGNOSIS — D509 Iron deficiency anemia, unspecified: Secondary | ICD-10-CM | POA: Diagnosis not present

## 2019-09-22 DIAGNOSIS — R17 Unspecified jaundice: Secondary | ICD-10-CM | POA: Diagnosis not present

## 2019-09-22 DIAGNOSIS — N186 End stage renal disease: Secondary | ICD-10-CM | POA: Diagnosis not present

## 2019-09-23 DIAGNOSIS — D631 Anemia in chronic kidney disease: Secondary | ICD-10-CM | POA: Diagnosis not present

## 2019-09-23 DIAGNOSIS — N186 End stage renal disease: Secondary | ICD-10-CM | POA: Diagnosis not present

## 2019-09-23 DIAGNOSIS — D509 Iron deficiency anemia, unspecified: Secondary | ICD-10-CM | POA: Diagnosis not present

## 2019-09-23 DIAGNOSIS — E875 Hyperkalemia: Secondary | ICD-10-CM | POA: Diagnosis not present

## 2019-09-23 DIAGNOSIS — R17 Unspecified jaundice: Secondary | ICD-10-CM | POA: Diagnosis not present

## 2019-09-23 DIAGNOSIS — Z992 Dependence on renal dialysis: Secondary | ICD-10-CM | POA: Diagnosis not present

## 2019-09-25 DIAGNOSIS — N186 End stage renal disease: Secondary | ICD-10-CM | POA: Diagnosis not present

## 2019-09-25 DIAGNOSIS — E875 Hyperkalemia: Secondary | ICD-10-CM | POA: Diagnosis not present

## 2019-09-25 DIAGNOSIS — Z992 Dependence on renal dialysis: Secondary | ICD-10-CM | POA: Diagnosis not present

## 2019-09-25 DIAGNOSIS — D631 Anemia in chronic kidney disease: Secondary | ICD-10-CM | POA: Diagnosis not present

## 2019-09-25 DIAGNOSIS — D509 Iron deficiency anemia, unspecified: Secondary | ICD-10-CM | POA: Diagnosis not present

## 2019-09-25 DIAGNOSIS — R17 Unspecified jaundice: Secondary | ICD-10-CM | POA: Diagnosis not present

## 2019-09-26 ENCOUNTER — Ambulatory Visit (INDEPENDENT_AMBULATORY_CARE_PROVIDER_SITE_OTHER): Payer: Medicare Other | Admitting: Podiatry

## 2019-09-26 ENCOUNTER — Encounter: Payer: Self-pay | Admitting: Podiatry

## 2019-09-26 ENCOUNTER — Other Ambulatory Visit: Payer: Self-pay

## 2019-09-26 VITALS — Temp 97.3°F

## 2019-09-26 DIAGNOSIS — M79675 Pain in left toe(s): Secondary | ICD-10-CM

## 2019-09-26 DIAGNOSIS — B351 Tinea unguium: Secondary | ICD-10-CM | POA: Diagnosis not present

## 2019-09-26 DIAGNOSIS — Z8673 Personal history of transient ischemic attack (TIA), and cerebral infarction without residual deficits: Secondary | ICD-10-CM

## 2019-09-26 DIAGNOSIS — L608 Other nail disorders: Secondary | ICD-10-CM

## 2019-09-26 DIAGNOSIS — N186 End stage renal disease: Secondary | ICD-10-CM

## 2019-09-26 DIAGNOSIS — Z992 Dependence on renal dialysis: Secondary | ICD-10-CM

## 2019-09-26 DIAGNOSIS — M79674 Pain in right toe(s): Secondary | ICD-10-CM

## 2019-09-26 NOTE — Progress Notes (Signed)
This patient returns to my office for at risk foot care.  This patient requires this care by a professional since this patient will be at risk due to having CVA and ESRD.   This patient is unable to cut nails himself since the patient cannot reach his nails.These nails are painful walking and wearing shoes.  This patient presents for at risk foot care today.  General Appearance  Alert, conversant and in no acute stress.  Vascular  Dorsalis pedis and posterior tibial  pulses are palpable  bilaterally.  Capillary return is within normal limits  bilaterally. Temperature is within normal limits  bilaterally.  Neurologic  Senn-Weinstein monofilament wire test within normal limits  bilaterally. Muscle power within normal limits bilaterally.  Nails Thick disfigured discolored nails with subungual debris  from hallux to fifth toes bilaterally. No evidence of bacterial infection or drainage bilaterally.  Orthopedic  No limitations of motion  feet .  No crepitus or effusions noted.  No bony pathology or digital deformities noted.  Skin  normotropic skin with no porokeratosis noted bilaterally.  No signs of infections or ulcers noted.     Onychomycosis  Pain in right toes  Pain in left toes  Consent was obtained for treatment procedures.   Mechanical debridement of nails 1-5  bilaterally performed with a nail nipper.  Filed with dremel without incident. No infection or ulcer.     Return office visit   3 months                   Told patient to return for periodic foot care and evaluation due to potential at risk complications.   Gardiner Barefoot DPM

## 2019-09-27 DIAGNOSIS — D631 Anemia in chronic kidney disease: Secondary | ICD-10-CM | POA: Diagnosis not present

## 2019-09-27 DIAGNOSIS — D509 Iron deficiency anemia, unspecified: Secondary | ICD-10-CM | POA: Diagnosis not present

## 2019-09-27 DIAGNOSIS — E875 Hyperkalemia: Secondary | ICD-10-CM | POA: Diagnosis not present

## 2019-09-27 DIAGNOSIS — N186 End stage renal disease: Secondary | ICD-10-CM | POA: Diagnosis not present

## 2019-09-27 DIAGNOSIS — Z992 Dependence on renal dialysis: Secondary | ICD-10-CM | POA: Diagnosis not present

## 2019-09-27 DIAGNOSIS — R17 Unspecified jaundice: Secondary | ICD-10-CM | POA: Diagnosis not present

## 2019-09-29 DIAGNOSIS — R17 Unspecified jaundice: Secondary | ICD-10-CM | POA: Diagnosis not present

## 2019-09-29 DIAGNOSIS — N186 End stage renal disease: Secondary | ICD-10-CM | POA: Diagnosis not present

## 2019-09-29 DIAGNOSIS — E875 Hyperkalemia: Secondary | ICD-10-CM | POA: Diagnosis not present

## 2019-09-29 DIAGNOSIS — Z992 Dependence on renal dialysis: Secondary | ICD-10-CM | POA: Diagnosis not present

## 2019-09-29 DIAGNOSIS — D631 Anemia in chronic kidney disease: Secondary | ICD-10-CM | POA: Diagnosis not present

## 2019-09-29 DIAGNOSIS — D509 Iron deficiency anemia, unspecified: Secondary | ICD-10-CM | POA: Diagnosis not present

## 2019-09-30 DIAGNOSIS — N186 End stage renal disease: Secondary | ICD-10-CM | POA: Diagnosis not present

## 2019-09-30 DIAGNOSIS — E875 Hyperkalemia: Secondary | ICD-10-CM | POA: Diagnosis not present

## 2019-09-30 DIAGNOSIS — Z992 Dependence on renal dialysis: Secondary | ICD-10-CM | POA: Diagnosis not present

## 2019-09-30 DIAGNOSIS — R17 Unspecified jaundice: Secondary | ICD-10-CM | POA: Diagnosis not present

## 2019-09-30 DIAGNOSIS — D631 Anemia in chronic kidney disease: Secondary | ICD-10-CM | POA: Diagnosis not present

## 2019-09-30 DIAGNOSIS — D509 Iron deficiency anemia, unspecified: Secondary | ICD-10-CM | POA: Diagnosis not present

## 2019-10-02 DIAGNOSIS — Z992 Dependence on renal dialysis: Secondary | ICD-10-CM | POA: Diagnosis not present

## 2019-10-02 DIAGNOSIS — R17 Unspecified jaundice: Secondary | ICD-10-CM | POA: Diagnosis not present

## 2019-10-02 DIAGNOSIS — N186 End stage renal disease: Secondary | ICD-10-CM | POA: Diagnosis not present

## 2019-10-02 DIAGNOSIS — E875 Hyperkalemia: Secondary | ICD-10-CM | POA: Diagnosis not present

## 2019-10-02 DIAGNOSIS — D631 Anemia in chronic kidney disease: Secondary | ICD-10-CM | POA: Diagnosis not present

## 2019-10-02 DIAGNOSIS — D509 Iron deficiency anemia, unspecified: Secondary | ICD-10-CM | POA: Diagnosis not present

## 2019-10-04 DIAGNOSIS — E875 Hyperkalemia: Secondary | ICD-10-CM | POA: Diagnosis not present

## 2019-10-04 DIAGNOSIS — D631 Anemia in chronic kidney disease: Secondary | ICD-10-CM | POA: Diagnosis not present

## 2019-10-04 DIAGNOSIS — R17 Unspecified jaundice: Secondary | ICD-10-CM | POA: Diagnosis not present

## 2019-10-04 DIAGNOSIS — D509 Iron deficiency anemia, unspecified: Secondary | ICD-10-CM | POA: Diagnosis not present

## 2019-10-04 DIAGNOSIS — Z992 Dependence on renal dialysis: Secondary | ICD-10-CM | POA: Diagnosis not present

## 2019-10-04 DIAGNOSIS — N186 End stage renal disease: Secondary | ICD-10-CM | POA: Diagnosis not present

## 2019-10-05 DIAGNOSIS — N041 Nephrotic syndrome with focal and segmental glomerular lesions: Secondary | ICD-10-CM | POA: Diagnosis not present

## 2019-10-05 DIAGNOSIS — N186 End stage renal disease: Secondary | ICD-10-CM | POA: Diagnosis not present

## 2019-10-05 DIAGNOSIS — Z992 Dependence on renal dialysis: Secondary | ICD-10-CM | POA: Diagnosis not present

## 2019-10-06 DIAGNOSIS — D631 Anemia in chronic kidney disease: Secondary | ICD-10-CM | POA: Diagnosis not present

## 2019-10-06 DIAGNOSIS — Z23 Encounter for immunization: Secondary | ICD-10-CM | POA: Diagnosis not present

## 2019-10-06 DIAGNOSIS — Z5189 Encounter for other specified aftercare: Secondary | ICD-10-CM | POA: Diagnosis not present

## 2019-10-06 DIAGNOSIS — R17 Unspecified jaundice: Secondary | ICD-10-CM | POA: Diagnosis not present

## 2019-10-06 DIAGNOSIS — N2589 Other disorders resulting from impaired renal tubular function: Secondary | ICD-10-CM | POA: Diagnosis not present

## 2019-10-06 DIAGNOSIS — E440 Moderate protein-calorie malnutrition: Secondary | ICD-10-CM | POA: Diagnosis not present

## 2019-10-06 DIAGNOSIS — E875 Hyperkalemia: Secondary | ICD-10-CM | POA: Diagnosis not present

## 2019-10-06 DIAGNOSIS — Z79899 Other long term (current) drug therapy: Secondary | ICD-10-CM | POA: Diagnosis not present

## 2019-10-06 DIAGNOSIS — D509 Iron deficiency anemia, unspecified: Secondary | ICD-10-CM | POA: Diagnosis not present

## 2019-10-06 DIAGNOSIS — Z992 Dependence on renal dialysis: Secondary | ICD-10-CM | POA: Diagnosis not present

## 2019-10-06 DIAGNOSIS — N186 End stage renal disease: Secondary | ICD-10-CM | POA: Diagnosis not present

## 2019-10-06 DIAGNOSIS — E8770 Fluid overload, unspecified: Secondary | ICD-10-CM | POA: Diagnosis not present

## 2019-10-06 DIAGNOSIS — N2581 Secondary hyperparathyroidism of renal origin: Secondary | ICD-10-CM | POA: Diagnosis not present

## 2019-10-07 DIAGNOSIS — Z79899 Other long term (current) drug therapy: Secondary | ICD-10-CM | POA: Diagnosis not present

## 2019-10-07 DIAGNOSIS — D631 Anemia in chronic kidney disease: Secondary | ICD-10-CM | POA: Diagnosis not present

## 2019-10-07 DIAGNOSIS — Z992 Dependence on renal dialysis: Secondary | ICD-10-CM | POA: Diagnosis not present

## 2019-10-07 DIAGNOSIS — N2589 Other disorders resulting from impaired renal tubular function: Secondary | ICD-10-CM | POA: Diagnosis not present

## 2019-10-07 DIAGNOSIS — D509 Iron deficiency anemia, unspecified: Secondary | ICD-10-CM | POA: Diagnosis not present

## 2019-10-07 DIAGNOSIS — N186 End stage renal disease: Secondary | ICD-10-CM | POA: Diagnosis not present

## 2019-10-09 DIAGNOSIS — D631 Anemia in chronic kidney disease: Secondary | ICD-10-CM | POA: Diagnosis not present

## 2019-10-09 DIAGNOSIS — Z79899 Other long term (current) drug therapy: Secondary | ICD-10-CM | POA: Diagnosis not present

## 2019-10-09 DIAGNOSIS — Z992 Dependence on renal dialysis: Secondary | ICD-10-CM | POA: Diagnosis not present

## 2019-10-09 DIAGNOSIS — N2589 Other disorders resulting from impaired renal tubular function: Secondary | ICD-10-CM | POA: Diagnosis not present

## 2019-10-09 DIAGNOSIS — D509 Iron deficiency anemia, unspecified: Secondary | ICD-10-CM | POA: Diagnosis not present

## 2019-10-09 DIAGNOSIS — N186 End stage renal disease: Secondary | ICD-10-CM | POA: Diagnosis not present

## 2019-10-11 DIAGNOSIS — D509 Iron deficiency anemia, unspecified: Secondary | ICD-10-CM | POA: Diagnosis not present

## 2019-10-11 DIAGNOSIS — N186 End stage renal disease: Secondary | ICD-10-CM | POA: Diagnosis not present

## 2019-10-11 DIAGNOSIS — Z79899 Other long term (current) drug therapy: Secondary | ICD-10-CM | POA: Diagnosis not present

## 2019-10-11 DIAGNOSIS — N2589 Other disorders resulting from impaired renal tubular function: Secondary | ICD-10-CM | POA: Diagnosis not present

## 2019-10-11 DIAGNOSIS — Z992 Dependence on renal dialysis: Secondary | ICD-10-CM | POA: Diagnosis not present

## 2019-10-11 DIAGNOSIS — D631 Anemia in chronic kidney disease: Secondary | ICD-10-CM | POA: Diagnosis not present

## 2019-10-13 DIAGNOSIS — D631 Anemia in chronic kidney disease: Secondary | ICD-10-CM | POA: Diagnosis not present

## 2019-10-13 DIAGNOSIS — Z992 Dependence on renal dialysis: Secondary | ICD-10-CM | POA: Diagnosis not present

## 2019-10-13 DIAGNOSIS — Z79899 Other long term (current) drug therapy: Secondary | ICD-10-CM | POA: Diagnosis not present

## 2019-10-13 DIAGNOSIS — N2589 Other disorders resulting from impaired renal tubular function: Secondary | ICD-10-CM | POA: Diagnosis not present

## 2019-10-13 DIAGNOSIS — N186 End stage renal disease: Secondary | ICD-10-CM | POA: Diagnosis not present

## 2019-10-13 DIAGNOSIS — D509 Iron deficiency anemia, unspecified: Secondary | ICD-10-CM | POA: Diagnosis not present

## 2019-10-14 DIAGNOSIS — D509 Iron deficiency anemia, unspecified: Secondary | ICD-10-CM | POA: Diagnosis not present

## 2019-10-14 DIAGNOSIS — N186 End stage renal disease: Secondary | ICD-10-CM | POA: Diagnosis not present

## 2019-10-14 DIAGNOSIS — N2589 Other disorders resulting from impaired renal tubular function: Secondary | ICD-10-CM | POA: Diagnosis not present

## 2019-10-14 DIAGNOSIS — Z992 Dependence on renal dialysis: Secondary | ICD-10-CM | POA: Diagnosis not present

## 2019-10-14 DIAGNOSIS — Z79899 Other long term (current) drug therapy: Secondary | ICD-10-CM | POA: Diagnosis not present

## 2019-10-14 DIAGNOSIS — D631 Anemia in chronic kidney disease: Secondary | ICD-10-CM | POA: Diagnosis not present

## 2019-10-16 DIAGNOSIS — Z79899 Other long term (current) drug therapy: Secondary | ICD-10-CM | POA: Diagnosis not present

## 2019-10-16 DIAGNOSIS — E7849 Other hyperlipidemia: Secondary | ICD-10-CM | POA: Diagnosis not present

## 2019-10-16 DIAGNOSIS — D509 Iron deficiency anemia, unspecified: Secondary | ICD-10-CM | POA: Diagnosis not present

## 2019-10-16 DIAGNOSIS — E790 Hyperuricemia without signs of inflammatory arthritis and tophaceous disease: Secondary | ICD-10-CM | POA: Diagnosis not present

## 2019-10-16 DIAGNOSIS — N2589 Other disorders resulting from impaired renal tubular function: Secondary | ICD-10-CM | POA: Diagnosis not present

## 2019-10-16 DIAGNOSIS — D631 Anemia in chronic kidney disease: Secondary | ICD-10-CM | POA: Diagnosis not present

## 2019-10-16 DIAGNOSIS — N186 End stage renal disease: Secondary | ICD-10-CM | POA: Diagnosis not present

## 2019-10-16 DIAGNOSIS — Z992 Dependence on renal dialysis: Secondary | ICD-10-CM | POA: Diagnosis not present

## 2019-10-16 DIAGNOSIS — E890 Postprocedural hypothyroidism: Secondary | ICD-10-CM | POA: Diagnosis not present

## 2019-10-18 DIAGNOSIS — D631 Anemia in chronic kidney disease: Secondary | ICD-10-CM | POA: Diagnosis not present

## 2019-10-18 DIAGNOSIS — D509 Iron deficiency anemia, unspecified: Secondary | ICD-10-CM | POA: Diagnosis not present

## 2019-10-18 DIAGNOSIS — N2589 Other disorders resulting from impaired renal tubular function: Secondary | ICD-10-CM | POA: Diagnosis not present

## 2019-10-18 DIAGNOSIS — Z992 Dependence on renal dialysis: Secondary | ICD-10-CM | POA: Diagnosis not present

## 2019-10-18 DIAGNOSIS — Z79899 Other long term (current) drug therapy: Secondary | ICD-10-CM | POA: Diagnosis not present

## 2019-10-18 DIAGNOSIS — N186 End stage renal disease: Secondary | ICD-10-CM | POA: Diagnosis not present

## 2019-10-20 DIAGNOSIS — Z79899 Other long term (current) drug therapy: Secondary | ICD-10-CM | POA: Diagnosis not present

## 2019-10-20 DIAGNOSIS — N186 End stage renal disease: Secondary | ICD-10-CM | POA: Diagnosis not present

## 2019-10-20 DIAGNOSIS — D631 Anemia in chronic kidney disease: Secondary | ICD-10-CM | POA: Diagnosis not present

## 2019-10-20 DIAGNOSIS — N2589 Other disorders resulting from impaired renal tubular function: Secondary | ICD-10-CM | POA: Diagnosis not present

## 2019-10-20 DIAGNOSIS — D509 Iron deficiency anemia, unspecified: Secondary | ICD-10-CM | POA: Diagnosis not present

## 2019-10-20 DIAGNOSIS — Z992 Dependence on renal dialysis: Secondary | ICD-10-CM | POA: Diagnosis not present

## 2019-10-21 DIAGNOSIS — D509 Iron deficiency anemia, unspecified: Secondary | ICD-10-CM | POA: Diagnosis not present

## 2019-10-21 DIAGNOSIS — N2589 Other disorders resulting from impaired renal tubular function: Secondary | ICD-10-CM | POA: Diagnosis not present

## 2019-10-21 DIAGNOSIS — Z992 Dependence on renal dialysis: Secondary | ICD-10-CM | POA: Diagnosis not present

## 2019-10-21 DIAGNOSIS — D631 Anemia in chronic kidney disease: Secondary | ICD-10-CM | POA: Diagnosis not present

## 2019-10-21 DIAGNOSIS — Z79899 Other long term (current) drug therapy: Secondary | ICD-10-CM | POA: Diagnosis not present

## 2019-10-21 DIAGNOSIS — N186 End stage renal disease: Secondary | ICD-10-CM | POA: Diagnosis not present

## 2019-10-23 DIAGNOSIS — N2589 Other disorders resulting from impaired renal tubular function: Secondary | ICD-10-CM | POA: Diagnosis not present

## 2019-10-23 DIAGNOSIS — D509 Iron deficiency anemia, unspecified: Secondary | ICD-10-CM | POA: Diagnosis not present

## 2019-10-23 DIAGNOSIS — Z992 Dependence on renal dialysis: Secondary | ICD-10-CM | POA: Diagnosis not present

## 2019-10-23 DIAGNOSIS — D631 Anemia in chronic kidney disease: Secondary | ICD-10-CM | POA: Diagnosis not present

## 2019-10-23 DIAGNOSIS — Z79899 Other long term (current) drug therapy: Secondary | ICD-10-CM | POA: Diagnosis not present

## 2019-10-23 DIAGNOSIS — N186 End stage renal disease: Secondary | ICD-10-CM | POA: Diagnosis not present

## 2019-10-25 DIAGNOSIS — D631 Anemia in chronic kidney disease: Secondary | ICD-10-CM | POA: Diagnosis not present

## 2019-10-25 DIAGNOSIS — D509 Iron deficiency anemia, unspecified: Secondary | ICD-10-CM | POA: Diagnosis not present

## 2019-10-25 DIAGNOSIS — N2589 Other disorders resulting from impaired renal tubular function: Secondary | ICD-10-CM | POA: Diagnosis not present

## 2019-10-25 DIAGNOSIS — N186 End stage renal disease: Secondary | ICD-10-CM | POA: Diagnosis not present

## 2019-10-25 DIAGNOSIS — Z79899 Other long term (current) drug therapy: Secondary | ICD-10-CM | POA: Diagnosis not present

## 2019-10-25 DIAGNOSIS — Z992 Dependence on renal dialysis: Secondary | ICD-10-CM | POA: Diagnosis not present

## 2019-10-27 DIAGNOSIS — N186 End stage renal disease: Secondary | ICD-10-CM | POA: Diagnosis not present

## 2019-10-27 DIAGNOSIS — N2589 Other disorders resulting from impaired renal tubular function: Secondary | ICD-10-CM | POA: Diagnosis not present

## 2019-10-27 DIAGNOSIS — D631 Anemia in chronic kidney disease: Secondary | ICD-10-CM | POA: Diagnosis not present

## 2019-10-27 DIAGNOSIS — D509 Iron deficiency anemia, unspecified: Secondary | ICD-10-CM | POA: Diagnosis not present

## 2019-10-27 DIAGNOSIS — Z79899 Other long term (current) drug therapy: Secondary | ICD-10-CM | POA: Diagnosis not present

## 2019-10-27 DIAGNOSIS — Z992 Dependence on renal dialysis: Secondary | ICD-10-CM | POA: Diagnosis not present

## 2019-10-28 DIAGNOSIS — Z79899 Other long term (current) drug therapy: Secondary | ICD-10-CM | POA: Diagnosis not present

## 2019-10-28 DIAGNOSIS — N2589 Other disorders resulting from impaired renal tubular function: Secondary | ICD-10-CM | POA: Diagnosis not present

## 2019-10-28 DIAGNOSIS — D509 Iron deficiency anemia, unspecified: Secondary | ICD-10-CM | POA: Diagnosis not present

## 2019-10-28 DIAGNOSIS — D631 Anemia in chronic kidney disease: Secondary | ICD-10-CM | POA: Diagnosis not present

## 2019-10-28 DIAGNOSIS — N186 End stage renal disease: Secondary | ICD-10-CM | POA: Diagnosis not present

## 2019-10-28 DIAGNOSIS — Z992 Dependence on renal dialysis: Secondary | ICD-10-CM | POA: Diagnosis not present

## 2019-10-30 DIAGNOSIS — N2589 Other disorders resulting from impaired renal tubular function: Secondary | ICD-10-CM | POA: Diagnosis not present

## 2019-10-30 DIAGNOSIS — Z79899 Other long term (current) drug therapy: Secondary | ICD-10-CM | POA: Diagnosis not present

## 2019-10-30 DIAGNOSIS — N186 End stage renal disease: Secondary | ICD-10-CM | POA: Diagnosis not present

## 2019-10-30 DIAGNOSIS — D509 Iron deficiency anemia, unspecified: Secondary | ICD-10-CM | POA: Diagnosis not present

## 2019-10-30 DIAGNOSIS — D631 Anemia in chronic kidney disease: Secondary | ICD-10-CM | POA: Diagnosis not present

## 2019-10-30 DIAGNOSIS — Z992 Dependence on renal dialysis: Secondary | ICD-10-CM | POA: Diagnosis not present

## 2019-11-01 DIAGNOSIS — Z992 Dependence on renal dialysis: Secondary | ICD-10-CM | POA: Diagnosis not present

## 2019-11-01 DIAGNOSIS — D509 Iron deficiency anemia, unspecified: Secondary | ICD-10-CM | POA: Diagnosis not present

## 2019-11-01 DIAGNOSIS — D631 Anemia in chronic kidney disease: Secondary | ICD-10-CM | POA: Diagnosis not present

## 2019-11-01 DIAGNOSIS — Z79899 Other long term (current) drug therapy: Secondary | ICD-10-CM | POA: Diagnosis not present

## 2019-11-01 DIAGNOSIS — N186 End stage renal disease: Secondary | ICD-10-CM | POA: Diagnosis not present

## 2019-11-01 DIAGNOSIS — N2589 Other disorders resulting from impaired renal tubular function: Secondary | ICD-10-CM | POA: Diagnosis not present

## 2019-11-02 DIAGNOSIS — Z992 Dependence on renal dialysis: Secondary | ICD-10-CM | POA: Diagnosis not present

## 2019-11-02 DIAGNOSIS — N186 End stage renal disease: Secondary | ICD-10-CM | POA: Diagnosis not present

## 2019-11-02 DIAGNOSIS — Z79899 Other long term (current) drug therapy: Secondary | ICD-10-CM | POA: Diagnosis not present

## 2019-11-02 DIAGNOSIS — N2589 Other disorders resulting from impaired renal tubular function: Secondary | ICD-10-CM | POA: Diagnosis not present

## 2019-11-02 DIAGNOSIS — D631 Anemia in chronic kidney disease: Secondary | ICD-10-CM | POA: Diagnosis not present

## 2019-11-02 DIAGNOSIS — D509 Iron deficiency anemia, unspecified: Secondary | ICD-10-CM | POA: Diagnosis not present

## 2019-11-03 DIAGNOSIS — N186 End stage renal disease: Secondary | ICD-10-CM | POA: Diagnosis not present

## 2019-11-03 DIAGNOSIS — D631 Anemia in chronic kidney disease: Secondary | ICD-10-CM | POA: Diagnosis not present

## 2019-11-03 DIAGNOSIS — Z992 Dependence on renal dialysis: Secondary | ICD-10-CM | POA: Diagnosis not present

## 2019-11-03 DIAGNOSIS — N2589 Other disorders resulting from impaired renal tubular function: Secondary | ICD-10-CM | POA: Diagnosis not present

## 2019-11-03 DIAGNOSIS — D509 Iron deficiency anemia, unspecified: Secondary | ICD-10-CM | POA: Diagnosis not present

## 2019-11-03 DIAGNOSIS — Z79899 Other long term (current) drug therapy: Secondary | ICD-10-CM | POA: Diagnosis not present

## 2019-11-04 DIAGNOSIS — Z992 Dependence on renal dialysis: Secondary | ICD-10-CM | POA: Diagnosis not present

## 2019-11-04 DIAGNOSIS — N186 End stage renal disease: Secondary | ICD-10-CM | POA: Diagnosis not present

## 2019-11-04 DIAGNOSIS — N041 Nephrotic syndrome with focal and segmental glomerular lesions: Secondary | ICD-10-CM | POA: Diagnosis not present

## 2019-11-06 DIAGNOSIS — E790 Hyperuricemia without signs of inflammatory arthritis and tophaceous disease: Secondary | ICD-10-CM | POA: Diagnosis not present

## 2019-11-06 DIAGNOSIS — E890 Postprocedural hypothyroidism: Secondary | ICD-10-CM | POA: Diagnosis not present

## 2019-12-01 DIAGNOSIS — E7800 Pure hypercholesterolemia, unspecified: Secondary | ICD-10-CM | POA: Diagnosis not present

## 2019-12-05 ENCOUNTER — Ambulatory Visit (INDEPENDENT_AMBULATORY_CARE_PROVIDER_SITE_OTHER): Payer: Medicare Other | Admitting: Podiatry

## 2019-12-05 ENCOUNTER — Other Ambulatory Visit: Payer: Self-pay

## 2019-12-05 ENCOUNTER — Encounter: Payer: Self-pay | Admitting: Podiatry

## 2019-12-05 VITALS — Temp 96.7°F

## 2019-12-05 DIAGNOSIS — Z992 Dependence on renal dialysis: Secondary | ICD-10-CM

## 2019-12-05 DIAGNOSIS — L608 Other nail disorders: Secondary | ICD-10-CM | POA: Diagnosis not present

## 2019-12-05 DIAGNOSIS — M79674 Pain in right toe(s): Secondary | ICD-10-CM | POA: Diagnosis not present

## 2019-12-05 DIAGNOSIS — B351 Tinea unguium: Secondary | ICD-10-CM

## 2019-12-05 DIAGNOSIS — N186 End stage renal disease: Secondary | ICD-10-CM | POA: Diagnosis not present

## 2019-12-05 DIAGNOSIS — M79675 Pain in left toe(s): Secondary | ICD-10-CM

## 2019-12-05 NOTE — Progress Notes (Signed)
This patient returns to my office for at risk foot care.  This patient requires this care by a professional since this patient will be at risk due to having  ESRD and CVA and neuropathy.  This patient is unable to cut nails herself since the patient cannot reach her nails.These nails are painful walking and wearing shoes.  This patient presents for at risk foot care today.  General Appearance  Alert, conversant and in no acute stress.  Vascular  Dorsalis pedis and posterior tibial  pulses are palpable  bilaterally.  Capillary return is within normal limits  bilaterally. Temperature is within normal limits  bilaterally.  Neurologic  Senn-Weinstein monofilament wire test within normal limits  bilaterally. Muscle power within normal limits bilaterally.  Nails Thick disfigured discolored nails with subungual debris  from hallux to fifth toes bilaterally.   Pincer hallux nails  B/L.No evidence of bacterial infection or drainage bilaterally.  Orthopedic  No limitations of motion  feet .  No crepitus or effusions noted.  No bony pathology or digital deformities noted.  Skin  normotropic skin with no porokeratosis noted bilaterally.  No signs of infections or ulcers noted.     Onychomycosis  Pain in right toes  Pain in left toes  Consent was obtained for treatment procedures.   Mechanical debridement of nails 1-5  bilaterally performed with a nail nipper.  Filed with dremel without incident.    Return office visit  3 months                    Told patient to return for periodic foot care and evaluation due to potential at risk complications.   Gardiner Barefoot DPM

## 2019-12-06 DIAGNOSIS — Z5189 Encounter for other specified aftercare: Secondary | ICD-10-CM | POA: Diagnosis not present

## 2019-12-06 DIAGNOSIS — K7689 Other specified diseases of liver: Secondary | ICD-10-CM | POA: Diagnosis not present

## 2019-12-06 DIAGNOSIS — E440 Moderate protein-calorie malnutrition: Secondary | ICD-10-CM | POA: Diagnosis not present

## 2019-12-06 DIAGNOSIS — R17 Unspecified jaundice: Secondary | ICD-10-CM | POA: Diagnosis not present

## 2019-12-06 DIAGNOSIS — Z992 Dependence on renal dialysis: Secondary | ICD-10-CM | POA: Diagnosis not present

## 2019-12-06 DIAGNOSIS — E8770 Fluid overload, unspecified: Secondary | ICD-10-CM | POA: Diagnosis not present

## 2019-12-06 DIAGNOSIS — D509 Iron deficiency anemia, unspecified: Secondary | ICD-10-CM | POA: Diagnosis not present

## 2019-12-06 DIAGNOSIS — N186 End stage renal disease: Secondary | ICD-10-CM | POA: Diagnosis not present

## 2019-12-06 DIAGNOSIS — E875 Hyperkalemia: Secondary | ICD-10-CM | POA: Diagnosis not present

## 2019-12-06 DIAGNOSIS — Z79899 Other long term (current) drug therapy: Secondary | ICD-10-CM | POA: Diagnosis not present

## 2019-12-06 DIAGNOSIS — N2581 Secondary hyperparathyroidism of renal origin: Secondary | ICD-10-CM | POA: Diagnosis not present

## 2019-12-06 DIAGNOSIS — Z4931 Encounter for adequacy testing for hemodialysis: Secondary | ICD-10-CM | POA: Diagnosis not present

## 2019-12-07 DIAGNOSIS — Z9114 Patient's other noncompliance with medication regimen: Secondary | ICD-10-CM | POA: Diagnosis not present

## 2019-12-07 DIAGNOSIS — R809 Proteinuria, unspecified: Secondary | ICD-10-CM | POA: Diagnosis not present

## 2019-12-07 DIAGNOSIS — G5601 Carpal tunnel syndrome, right upper limb: Secondary | ICD-10-CM | POA: Diagnosis not present

## 2019-12-07 DIAGNOSIS — N042 Nephrotic syndrome with diffuse membranous glomerulonephritis: Secondary | ICD-10-CM | POA: Diagnosis not present

## 2019-12-07 DIAGNOSIS — Z1389 Encounter for screening for other disorder: Secondary | ICD-10-CM | POA: Diagnosis not present

## 2019-12-07 DIAGNOSIS — E7800 Pure hypercholesterolemia, unspecified: Secondary | ICD-10-CM | POA: Diagnosis not present

## 2019-12-07 DIAGNOSIS — N186 End stage renal disease: Secondary | ICD-10-CM | POA: Diagnosis not present

## 2019-12-07 DIAGNOSIS — I129 Hypertensive chronic kidney disease with stage 1 through stage 4 chronic kidney disease, or unspecified chronic kidney disease: Secondary | ICD-10-CM | POA: Diagnosis not present

## 2019-12-07 DIAGNOSIS — G629 Polyneuropathy, unspecified: Secondary | ICD-10-CM | POA: Diagnosis not present

## 2019-12-07 DIAGNOSIS — G8194 Hemiplegia, unspecified affecting left nondominant side: Secondary | ICD-10-CM | POA: Diagnosis not present

## 2019-12-07 DIAGNOSIS — D631 Anemia in chronic kidney disease: Secondary | ICD-10-CM | POA: Diagnosis not present

## 2019-12-07 DIAGNOSIS — Z0000 Encounter for general adult medical examination without abnormal findings: Secondary | ICD-10-CM | POA: Diagnosis not present

## 2019-12-08 DIAGNOSIS — Z992 Dependence on renal dialysis: Secondary | ICD-10-CM | POA: Diagnosis not present

## 2019-12-08 DIAGNOSIS — Z79899 Other long term (current) drug therapy: Secondary | ICD-10-CM | POA: Diagnosis not present

## 2019-12-08 DIAGNOSIS — D509 Iron deficiency anemia, unspecified: Secondary | ICD-10-CM | POA: Diagnosis not present

## 2019-12-08 DIAGNOSIS — Z4931 Encounter for adequacy testing for hemodialysis: Secondary | ICD-10-CM | POA: Diagnosis not present

## 2019-12-08 DIAGNOSIS — N2581 Secondary hyperparathyroidism of renal origin: Secondary | ICD-10-CM | POA: Diagnosis not present

## 2019-12-08 DIAGNOSIS — N186 End stage renal disease: Secondary | ICD-10-CM | POA: Diagnosis not present

## 2019-12-09 DIAGNOSIS — N186 End stage renal disease: Secondary | ICD-10-CM | POA: Diagnosis not present

## 2019-12-09 DIAGNOSIS — N2581 Secondary hyperparathyroidism of renal origin: Secondary | ICD-10-CM | POA: Diagnosis not present

## 2019-12-09 DIAGNOSIS — D509 Iron deficiency anemia, unspecified: Secondary | ICD-10-CM | POA: Diagnosis not present

## 2019-12-09 DIAGNOSIS — Z79899 Other long term (current) drug therapy: Secondary | ICD-10-CM | POA: Diagnosis not present

## 2019-12-09 DIAGNOSIS — Z4931 Encounter for adequacy testing for hemodialysis: Secondary | ICD-10-CM | POA: Diagnosis not present

## 2019-12-09 DIAGNOSIS — Z992 Dependence on renal dialysis: Secondary | ICD-10-CM | POA: Diagnosis not present

## 2019-12-11 DIAGNOSIS — Z992 Dependence on renal dialysis: Secondary | ICD-10-CM | POA: Diagnosis not present

## 2019-12-11 DIAGNOSIS — N2581 Secondary hyperparathyroidism of renal origin: Secondary | ICD-10-CM | POA: Diagnosis not present

## 2019-12-11 DIAGNOSIS — D509 Iron deficiency anemia, unspecified: Secondary | ICD-10-CM | POA: Diagnosis not present

## 2019-12-11 DIAGNOSIS — Z4931 Encounter for adequacy testing for hemodialysis: Secondary | ICD-10-CM | POA: Diagnosis not present

## 2019-12-11 DIAGNOSIS — Z79899 Other long term (current) drug therapy: Secondary | ICD-10-CM | POA: Diagnosis not present

## 2019-12-11 DIAGNOSIS — N186 End stage renal disease: Secondary | ICD-10-CM | POA: Diagnosis not present

## 2019-12-13 DIAGNOSIS — Z4931 Encounter for adequacy testing for hemodialysis: Secondary | ICD-10-CM | POA: Diagnosis not present

## 2019-12-13 DIAGNOSIS — N186 End stage renal disease: Secondary | ICD-10-CM | POA: Diagnosis not present

## 2019-12-13 DIAGNOSIS — D509 Iron deficiency anemia, unspecified: Secondary | ICD-10-CM | POA: Diagnosis not present

## 2019-12-13 DIAGNOSIS — N2581 Secondary hyperparathyroidism of renal origin: Secondary | ICD-10-CM | POA: Diagnosis not present

## 2019-12-13 DIAGNOSIS — Z79899 Other long term (current) drug therapy: Secondary | ICD-10-CM | POA: Diagnosis not present

## 2019-12-13 DIAGNOSIS — Z992 Dependence on renal dialysis: Secondary | ICD-10-CM | POA: Diagnosis not present

## 2019-12-15 DIAGNOSIS — N2581 Secondary hyperparathyroidism of renal origin: Secondary | ICD-10-CM | POA: Diagnosis not present

## 2019-12-15 DIAGNOSIS — N186 End stage renal disease: Secondary | ICD-10-CM | POA: Diagnosis not present

## 2019-12-15 DIAGNOSIS — Z4931 Encounter for adequacy testing for hemodialysis: Secondary | ICD-10-CM | POA: Diagnosis not present

## 2019-12-15 DIAGNOSIS — Z992 Dependence on renal dialysis: Secondary | ICD-10-CM | POA: Diagnosis not present

## 2019-12-15 DIAGNOSIS — D509 Iron deficiency anemia, unspecified: Secondary | ICD-10-CM | POA: Diagnosis not present

## 2019-12-15 DIAGNOSIS — Z79899 Other long term (current) drug therapy: Secondary | ICD-10-CM | POA: Diagnosis not present

## 2019-12-16 DIAGNOSIS — D509 Iron deficiency anemia, unspecified: Secondary | ICD-10-CM | POA: Diagnosis not present

## 2019-12-16 DIAGNOSIS — Z79899 Other long term (current) drug therapy: Secondary | ICD-10-CM | POA: Diagnosis not present

## 2019-12-16 DIAGNOSIS — Z4931 Encounter for adequacy testing for hemodialysis: Secondary | ICD-10-CM | POA: Diagnosis not present

## 2019-12-16 DIAGNOSIS — Z992 Dependence on renal dialysis: Secondary | ICD-10-CM | POA: Diagnosis not present

## 2019-12-16 DIAGNOSIS — N186 End stage renal disease: Secondary | ICD-10-CM | POA: Diagnosis not present

## 2019-12-16 DIAGNOSIS — N2581 Secondary hyperparathyroidism of renal origin: Secondary | ICD-10-CM | POA: Diagnosis not present

## 2019-12-18 DIAGNOSIS — Z992 Dependence on renal dialysis: Secondary | ICD-10-CM | POA: Diagnosis not present

## 2019-12-18 DIAGNOSIS — D509 Iron deficiency anemia, unspecified: Secondary | ICD-10-CM | POA: Diagnosis not present

## 2019-12-18 DIAGNOSIS — N2581 Secondary hyperparathyroidism of renal origin: Secondary | ICD-10-CM | POA: Diagnosis not present

## 2019-12-18 DIAGNOSIS — Z79899 Other long term (current) drug therapy: Secondary | ICD-10-CM | POA: Diagnosis not present

## 2019-12-18 DIAGNOSIS — Z4931 Encounter for adequacy testing for hemodialysis: Secondary | ICD-10-CM | POA: Diagnosis not present

## 2019-12-18 DIAGNOSIS — N186 End stage renal disease: Secondary | ICD-10-CM | POA: Diagnosis not present

## 2019-12-20 DIAGNOSIS — Z79899 Other long term (current) drug therapy: Secondary | ICD-10-CM | POA: Diagnosis not present

## 2019-12-20 DIAGNOSIS — Z4931 Encounter for adequacy testing for hemodialysis: Secondary | ICD-10-CM | POA: Diagnosis not present

## 2019-12-20 DIAGNOSIS — N2581 Secondary hyperparathyroidism of renal origin: Secondary | ICD-10-CM | POA: Diagnosis not present

## 2019-12-20 DIAGNOSIS — D509 Iron deficiency anemia, unspecified: Secondary | ICD-10-CM | POA: Diagnosis not present

## 2019-12-20 DIAGNOSIS — N186 End stage renal disease: Secondary | ICD-10-CM | POA: Diagnosis not present

## 2019-12-20 DIAGNOSIS — Z992 Dependence on renal dialysis: Secondary | ICD-10-CM | POA: Diagnosis not present

## 2019-12-22 DIAGNOSIS — Z4931 Encounter for adequacy testing for hemodialysis: Secondary | ICD-10-CM | POA: Diagnosis not present

## 2019-12-22 DIAGNOSIS — N2581 Secondary hyperparathyroidism of renal origin: Secondary | ICD-10-CM | POA: Diagnosis not present

## 2019-12-22 DIAGNOSIS — D509 Iron deficiency anemia, unspecified: Secondary | ICD-10-CM | POA: Diagnosis not present

## 2019-12-22 DIAGNOSIS — Z79899 Other long term (current) drug therapy: Secondary | ICD-10-CM | POA: Diagnosis not present

## 2019-12-22 DIAGNOSIS — N186 End stage renal disease: Secondary | ICD-10-CM | POA: Diagnosis not present

## 2019-12-22 DIAGNOSIS — Z992 Dependence on renal dialysis: Secondary | ICD-10-CM | POA: Diagnosis not present

## 2019-12-23 DIAGNOSIS — Z79899 Other long term (current) drug therapy: Secondary | ICD-10-CM | POA: Diagnosis not present

## 2019-12-23 DIAGNOSIS — Z4931 Encounter for adequacy testing for hemodialysis: Secondary | ICD-10-CM | POA: Diagnosis not present

## 2019-12-23 DIAGNOSIS — N186 End stage renal disease: Secondary | ICD-10-CM | POA: Diagnosis not present

## 2019-12-23 DIAGNOSIS — N2581 Secondary hyperparathyroidism of renal origin: Secondary | ICD-10-CM | POA: Diagnosis not present

## 2019-12-23 DIAGNOSIS — D509 Iron deficiency anemia, unspecified: Secondary | ICD-10-CM | POA: Diagnosis not present

## 2019-12-23 DIAGNOSIS — Z992 Dependence on renal dialysis: Secondary | ICD-10-CM | POA: Diagnosis not present

## 2019-12-25 DIAGNOSIS — Z79899 Other long term (current) drug therapy: Secondary | ICD-10-CM | POA: Diagnosis not present

## 2019-12-25 DIAGNOSIS — D509 Iron deficiency anemia, unspecified: Secondary | ICD-10-CM | POA: Diagnosis not present

## 2019-12-25 DIAGNOSIS — N186 End stage renal disease: Secondary | ICD-10-CM | POA: Diagnosis not present

## 2019-12-25 DIAGNOSIS — N2581 Secondary hyperparathyroidism of renal origin: Secondary | ICD-10-CM | POA: Diagnosis not present

## 2019-12-25 DIAGNOSIS — Z4931 Encounter for adequacy testing for hemodialysis: Secondary | ICD-10-CM | POA: Diagnosis not present

## 2019-12-25 DIAGNOSIS — Z992 Dependence on renal dialysis: Secondary | ICD-10-CM | POA: Diagnosis not present

## 2019-12-27 DIAGNOSIS — N186 End stage renal disease: Secondary | ICD-10-CM | POA: Diagnosis not present

## 2019-12-27 DIAGNOSIS — Z79899 Other long term (current) drug therapy: Secondary | ICD-10-CM | POA: Diagnosis not present

## 2019-12-27 DIAGNOSIS — D509 Iron deficiency anemia, unspecified: Secondary | ICD-10-CM | POA: Diagnosis not present

## 2019-12-27 DIAGNOSIS — N2581 Secondary hyperparathyroidism of renal origin: Secondary | ICD-10-CM | POA: Diagnosis not present

## 2019-12-27 DIAGNOSIS — Z4931 Encounter for adequacy testing for hemodialysis: Secondary | ICD-10-CM | POA: Diagnosis not present

## 2019-12-27 DIAGNOSIS — Z992 Dependence on renal dialysis: Secondary | ICD-10-CM | POA: Diagnosis not present

## 2019-12-29 DIAGNOSIS — N2581 Secondary hyperparathyroidism of renal origin: Secondary | ICD-10-CM | POA: Diagnosis not present

## 2019-12-29 DIAGNOSIS — Z992 Dependence on renal dialysis: Secondary | ICD-10-CM | POA: Diagnosis not present

## 2019-12-29 DIAGNOSIS — D509 Iron deficiency anemia, unspecified: Secondary | ICD-10-CM | POA: Diagnosis not present

## 2019-12-29 DIAGNOSIS — Z79899 Other long term (current) drug therapy: Secondary | ICD-10-CM | POA: Diagnosis not present

## 2019-12-29 DIAGNOSIS — Z4931 Encounter for adequacy testing for hemodialysis: Secondary | ICD-10-CM | POA: Diagnosis not present

## 2019-12-29 DIAGNOSIS — N186 End stage renal disease: Secondary | ICD-10-CM | POA: Diagnosis not present

## 2019-12-29 DIAGNOSIS — R9439 Abnormal result of other cardiovascular function study: Secondary | ICD-10-CM | POA: Insufficient documentation

## 2019-12-30 DIAGNOSIS — N186 End stage renal disease: Secondary | ICD-10-CM | POA: Diagnosis not present

## 2019-12-30 DIAGNOSIS — N2581 Secondary hyperparathyroidism of renal origin: Secondary | ICD-10-CM | POA: Diagnosis not present

## 2019-12-30 DIAGNOSIS — D509 Iron deficiency anemia, unspecified: Secondary | ICD-10-CM | POA: Diagnosis not present

## 2019-12-30 DIAGNOSIS — Z79899 Other long term (current) drug therapy: Secondary | ICD-10-CM | POA: Diagnosis not present

## 2019-12-30 DIAGNOSIS — Z4931 Encounter for adequacy testing for hemodialysis: Secondary | ICD-10-CM | POA: Diagnosis not present

## 2019-12-30 DIAGNOSIS — Z992 Dependence on renal dialysis: Secondary | ICD-10-CM | POA: Diagnosis not present

## 2020-01-01 DIAGNOSIS — Z79899 Other long term (current) drug therapy: Secondary | ICD-10-CM | POA: Diagnosis not present

## 2020-01-01 DIAGNOSIS — Z4931 Encounter for adequacy testing for hemodialysis: Secondary | ICD-10-CM | POA: Diagnosis not present

## 2020-01-01 DIAGNOSIS — N186 End stage renal disease: Secondary | ICD-10-CM | POA: Diagnosis not present

## 2020-01-01 DIAGNOSIS — Z992 Dependence on renal dialysis: Secondary | ICD-10-CM | POA: Diagnosis not present

## 2020-01-01 DIAGNOSIS — D509 Iron deficiency anemia, unspecified: Secondary | ICD-10-CM | POA: Diagnosis not present

## 2020-01-01 DIAGNOSIS — N2581 Secondary hyperparathyroidism of renal origin: Secondary | ICD-10-CM | POA: Diagnosis not present

## 2020-01-03 DIAGNOSIS — D509 Iron deficiency anemia, unspecified: Secondary | ICD-10-CM | POA: Diagnosis not present

## 2020-01-03 DIAGNOSIS — Z79899 Other long term (current) drug therapy: Secondary | ICD-10-CM | POA: Diagnosis not present

## 2020-01-03 DIAGNOSIS — N186 End stage renal disease: Secondary | ICD-10-CM | POA: Diagnosis not present

## 2020-01-03 DIAGNOSIS — Z992 Dependence on renal dialysis: Secondary | ICD-10-CM | POA: Diagnosis not present

## 2020-01-03 DIAGNOSIS — Z4931 Encounter for adequacy testing for hemodialysis: Secondary | ICD-10-CM | POA: Diagnosis not present

## 2020-01-03 DIAGNOSIS — N2581 Secondary hyperparathyroidism of renal origin: Secondary | ICD-10-CM | POA: Diagnosis not present

## 2020-01-04 DIAGNOSIS — G8114 Spastic hemiplegia affecting left nondominant side: Secondary | ICD-10-CM

## 2020-01-04 DIAGNOSIS — N041 Nephrotic syndrome with focal and segmental glomerular lesions: Secondary | ICD-10-CM | POA: Diagnosis not present

## 2020-01-04 DIAGNOSIS — Z992 Dependence on renal dialysis: Secondary | ICD-10-CM | POA: Diagnosis not present

## 2020-01-04 DIAGNOSIS — N186 End stage renal disease: Secondary | ICD-10-CM | POA: Diagnosis not present

## 2020-01-05 DIAGNOSIS — E440 Moderate protein-calorie malnutrition: Secondary | ICD-10-CM | POA: Diagnosis not present

## 2020-01-05 DIAGNOSIS — Z992 Dependence on renal dialysis: Secondary | ICD-10-CM | POA: Diagnosis not present

## 2020-01-05 DIAGNOSIS — N2589 Other disorders resulting from impaired renal tubular function: Secondary | ICD-10-CM | POA: Diagnosis not present

## 2020-01-05 DIAGNOSIS — D631 Anemia in chronic kidney disease: Secondary | ICD-10-CM | POA: Diagnosis not present

## 2020-01-05 DIAGNOSIS — Z5189 Encounter for other specified aftercare: Secondary | ICD-10-CM | POA: Diagnosis not present

## 2020-01-05 DIAGNOSIS — E8770 Fluid overload, unspecified: Secondary | ICD-10-CM | POA: Diagnosis not present

## 2020-01-05 DIAGNOSIS — E876 Hypokalemia: Secondary | ICD-10-CM | POA: Diagnosis not present

## 2020-01-05 DIAGNOSIS — R9439 Abnormal result of other cardiovascular function study: Secondary | ICD-10-CM | POA: Diagnosis not present

## 2020-01-05 DIAGNOSIS — I120 Hypertensive chronic kidney disease with stage 5 chronic kidney disease or end stage renal disease: Secondary | ICD-10-CM | POA: Diagnosis not present

## 2020-01-05 DIAGNOSIS — D509 Iron deficiency anemia, unspecified: Secondary | ICD-10-CM | POA: Diagnosis not present

## 2020-01-05 DIAGNOSIS — N186 End stage renal disease: Secondary | ICD-10-CM | POA: Diagnosis not present

## 2020-01-05 DIAGNOSIS — N2581 Secondary hyperparathyroidism of renal origin: Secondary | ICD-10-CM | POA: Diagnosis not present

## 2020-01-05 DIAGNOSIS — I69354 Hemiplegia and hemiparesis following cerebral infarction affecting left non-dominant side: Secondary | ICD-10-CM | POA: Diagnosis not present

## 2020-01-05 DIAGNOSIS — E875 Hyperkalemia: Secondary | ICD-10-CM | POA: Diagnosis not present

## 2020-01-05 DIAGNOSIS — Z79899 Other long term (current) drug therapy: Secondary | ICD-10-CM | POA: Diagnosis not present

## 2020-01-05 DIAGNOSIS — R17 Unspecified jaundice: Secondary | ICD-10-CM | POA: Diagnosis not present

## 2020-01-06 DIAGNOSIS — E875 Hyperkalemia: Secondary | ICD-10-CM | POA: Diagnosis not present

## 2020-01-06 DIAGNOSIS — D631 Anemia in chronic kidney disease: Secondary | ICD-10-CM | POA: Diagnosis not present

## 2020-01-06 DIAGNOSIS — N186 End stage renal disease: Secondary | ICD-10-CM | POA: Diagnosis not present

## 2020-01-06 DIAGNOSIS — Z992 Dependence on renal dialysis: Secondary | ICD-10-CM | POA: Diagnosis not present

## 2020-01-06 DIAGNOSIS — N2581 Secondary hyperparathyroidism of renal origin: Secondary | ICD-10-CM | POA: Diagnosis not present

## 2020-01-06 DIAGNOSIS — E876 Hypokalemia: Secondary | ICD-10-CM | POA: Diagnosis not present

## 2020-01-08 DIAGNOSIS — E875 Hyperkalemia: Secondary | ICD-10-CM | POA: Diagnosis not present

## 2020-01-08 DIAGNOSIS — Z992 Dependence on renal dialysis: Secondary | ICD-10-CM | POA: Diagnosis not present

## 2020-01-08 DIAGNOSIS — N186 End stage renal disease: Secondary | ICD-10-CM | POA: Diagnosis not present

## 2020-01-08 DIAGNOSIS — N2581 Secondary hyperparathyroidism of renal origin: Secondary | ICD-10-CM | POA: Diagnosis not present

## 2020-01-08 DIAGNOSIS — E876 Hypokalemia: Secondary | ICD-10-CM | POA: Diagnosis not present

## 2020-01-08 DIAGNOSIS — D631 Anemia in chronic kidney disease: Secondary | ICD-10-CM | POA: Diagnosis not present

## 2020-01-09 DIAGNOSIS — N2581 Secondary hyperparathyroidism of renal origin: Secondary | ICD-10-CM | POA: Diagnosis not present

## 2020-01-09 DIAGNOSIS — E790 Hyperuricemia without signs of inflammatory arthritis and tophaceous disease: Secondary | ICD-10-CM | POA: Diagnosis not present

## 2020-01-09 DIAGNOSIS — E875 Hyperkalemia: Secondary | ICD-10-CM | POA: Diagnosis not present

## 2020-01-09 DIAGNOSIS — N186 End stage renal disease: Secondary | ICD-10-CM | POA: Diagnosis not present

## 2020-01-09 DIAGNOSIS — E7849 Other hyperlipidemia: Secondary | ICD-10-CM | POA: Diagnosis not present

## 2020-01-09 DIAGNOSIS — D631 Anemia in chronic kidney disease: Secondary | ICD-10-CM | POA: Diagnosis not present

## 2020-01-09 DIAGNOSIS — E876 Hypokalemia: Secondary | ICD-10-CM | POA: Diagnosis not present

## 2020-01-09 DIAGNOSIS — E890 Postprocedural hypothyroidism: Secondary | ICD-10-CM | POA: Diagnosis not present

## 2020-01-09 DIAGNOSIS — Z992 Dependence on renal dialysis: Secondary | ICD-10-CM | POA: Diagnosis not present

## 2020-01-10 DIAGNOSIS — E875 Hyperkalemia: Secondary | ICD-10-CM | POA: Diagnosis not present

## 2020-01-10 DIAGNOSIS — Z992 Dependence on renal dialysis: Secondary | ICD-10-CM | POA: Diagnosis not present

## 2020-01-10 DIAGNOSIS — D631 Anemia in chronic kidney disease: Secondary | ICD-10-CM | POA: Diagnosis not present

## 2020-01-10 DIAGNOSIS — N2581 Secondary hyperparathyroidism of renal origin: Secondary | ICD-10-CM | POA: Diagnosis not present

## 2020-01-10 DIAGNOSIS — N186 End stage renal disease: Secondary | ICD-10-CM | POA: Diagnosis not present

## 2020-01-10 DIAGNOSIS — E876 Hypokalemia: Secondary | ICD-10-CM | POA: Diagnosis not present

## 2020-01-12 DIAGNOSIS — Z992 Dependence on renal dialysis: Secondary | ICD-10-CM | POA: Diagnosis not present

## 2020-01-12 DIAGNOSIS — E876 Hypokalemia: Secondary | ICD-10-CM | POA: Diagnosis not present

## 2020-01-12 DIAGNOSIS — E875 Hyperkalemia: Secondary | ICD-10-CM | POA: Diagnosis not present

## 2020-01-12 DIAGNOSIS — D631 Anemia in chronic kidney disease: Secondary | ICD-10-CM | POA: Diagnosis not present

## 2020-01-12 DIAGNOSIS — N2581 Secondary hyperparathyroidism of renal origin: Secondary | ICD-10-CM | POA: Diagnosis not present

## 2020-01-12 DIAGNOSIS — N186 End stage renal disease: Secondary | ICD-10-CM | POA: Diagnosis not present

## 2020-01-13 DIAGNOSIS — N2581 Secondary hyperparathyroidism of renal origin: Secondary | ICD-10-CM | POA: Diagnosis not present

## 2020-01-13 DIAGNOSIS — Z992 Dependence on renal dialysis: Secondary | ICD-10-CM | POA: Diagnosis not present

## 2020-01-13 DIAGNOSIS — E876 Hypokalemia: Secondary | ICD-10-CM | POA: Diagnosis not present

## 2020-01-13 DIAGNOSIS — D631 Anemia in chronic kidney disease: Secondary | ICD-10-CM | POA: Diagnosis not present

## 2020-01-13 DIAGNOSIS — E875 Hyperkalemia: Secondary | ICD-10-CM | POA: Diagnosis not present

## 2020-01-13 DIAGNOSIS — N186 End stage renal disease: Secondary | ICD-10-CM | POA: Diagnosis not present

## 2020-01-15 DIAGNOSIS — D631 Anemia in chronic kidney disease: Secondary | ICD-10-CM | POA: Diagnosis not present

## 2020-01-15 DIAGNOSIS — N2581 Secondary hyperparathyroidism of renal origin: Secondary | ICD-10-CM | POA: Diagnosis not present

## 2020-01-15 DIAGNOSIS — E875 Hyperkalemia: Secondary | ICD-10-CM | POA: Diagnosis not present

## 2020-01-15 DIAGNOSIS — E876 Hypokalemia: Secondary | ICD-10-CM | POA: Diagnosis not present

## 2020-01-15 DIAGNOSIS — Z992 Dependence on renal dialysis: Secondary | ICD-10-CM | POA: Diagnosis not present

## 2020-01-15 DIAGNOSIS — N186 End stage renal disease: Secondary | ICD-10-CM | POA: Diagnosis not present

## 2020-01-17 DIAGNOSIS — N2581 Secondary hyperparathyroidism of renal origin: Secondary | ICD-10-CM | POA: Diagnosis not present

## 2020-01-17 DIAGNOSIS — Z992 Dependence on renal dialysis: Secondary | ICD-10-CM | POA: Diagnosis not present

## 2020-01-17 DIAGNOSIS — D631 Anemia in chronic kidney disease: Secondary | ICD-10-CM | POA: Diagnosis not present

## 2020-01-17 DIAGNOSIS — N186 End stage renal disease: Secondary | ICD-10-CM | POA: Diagnosis not present

## 2020-01-17 DIAGNOSIS — E875 Hyperkalemia: Secondary | ICD-10-CM | POA: Diagnosis not present

## 2020-01-17 DIAGNOSIS — E876 Hypokalemia: Secondary | ICD-10-CM | POA: Diagnosis not present

## 2020-01-19 DIAGNOSIS — Z992 Dependence on renal dialysis: Secondary | ICD-10-CM | POA: Diagnosis not present

## 2020-01-19 DIAGNOSIS — N186 End stage renal disease: Secondary | ICD-10-CM | POA: Diagnosis not present

## 2020-01-19 DIAGNOSIS — D631 Anemia in chronic kidney disease: Secondary | ICD-10-CM | POA: Diagnosis not present

## 2020-01-19 DIAGNOSIS — E875 Hyperkalemia: Secondary | ICD-10-CM | POA: Diagnosis not present

## 2020-01-19 DIAGNOSIS — N2581 Secondary hyperparathyroidism of renal origin: Secondary | ICD-10-CM | POA: Diagnosis not present

## 2020-01-19 DIAGNOSIS — E876 Hypokalemia: Secondary | ICD-10-CM | POA: Diagnosis not present

## 2020-01-20 DIAGNOSIS — E876 Hypokalemia: Secondary | ICD-10-CM | POA: Diagnosis not present

## 2020-01-20 DIAGNOSIS — Z992 Dependence on renal dialysis: Secondary | ICD-10-CM | POA: Diagnosis not present

## 2020-01-20 DIAGNOSIS — E875 Hyperkalemia: Secondary | ICD-10-CM | POA: Diagnosis not present

## 2020-01-20 DIAGNOSIS — D631 Anemia in chronic kidney disease: Secondary | ICD-10-CM | POA: Diagnosis not present

## 2020-01-20 DIAGNOSIS — N186 End stage renal disease: Secondary | ICD-10-CM | POA: Diagnosis not present

## 2020-01-20 DIAGNOSIS — N2581 Secondary hyperparathyroidism of renal origin: Secondary | ICD-10-CM | POA: Diagnosis not present

## 2020-01-22 DIAGNOSIS — Z992 Dependence on renal dialysis: Secondary | ICD-10-CM | POA: Diagnosis not present

## 2020-01-22 DIAGNOSIS — N186 End stage renal disease: Secondary | ICD-10-CM | POA: Diagnosis not present

## 2020-01-22 DIAGNOSIS — N2581 Secondary hyperparathyroidism of renal origin: Secondary | ICD-10-CM | POA: Diagnosis not present

## 2020-01-22 DIAGNOSIS — E876 Hypokalemia: Secondary | ICD-10-CM | POA: Diagnosis not present

## 2020-01-22 DIAGNOSIS — E875 Hyperkalemia: Secondary | ICD-10-CM | POA: Diagnosis not present

## 2020-01-22 DIAGNOSIS — D631 Anemia in chronic kidney disease: Secondary | ICD-10-CM | POA: Diagnosis not present

## 2020-01-24 DIAGNOSIS — E875 Hyperkalemia: Secondary | ICD-10-CM | POA: Diagnosis not present

## 2020-01-24 DIAGNOSIS — N186 End stage renal disease: Secondary | ICD-10-CM | POA: Diagnosis not present

## 2020-01-24 DIAGNOSIS — E876 Hypokalemia: Secondary | ICD-10-CM | POA: Diagnosis not present

## 2020-01-24 DIAGNOSIS — Z992 Dependence on renal dialysis: Secondary | ICD-10-CM | POA: Diagnosis not present

## 2020-01-24 DIAGNOSIS — N2581 Secondary hyperparathyroidism of renal origin: Secondary | ICD-10-CM | POA: Diagnosis not present

## 2020-01-24 DIAGNOSIS — D631 Anemia in chronic kidney disease: Secondary | ICD-10-CM | POA: Diagnosis not present

## 2020-01-25 DIAGNOSIS — N2581 Secondary hyperparathyroidism of renal origin: Secondary | ICD-10-CM | POA: Diagnosis not present

## 2020-01-25 DIAGNOSIS — E876 Hypokalemia: Secondary | ICD-10-CM | POA: Diagnosis not present

## 2020-01-25 DIAGNOSIS — E875 Hyperkalemia: Secondary | ICD-10-CM | POA: Diagnosis not present

## 2020-01-25 DIAGNOSIS — D631 Anemia in chronic kidney disease: Secondary | ICD-10-CM | POA: Diagnosis not present

## 2020-01-25 DIAGNOSIS — Z992 Dependence on renal dialysis: Secondary | ICD-10-CM | POA: Diagnosis not present

## 2020-01-25 DIAGNOSIS — N186 End stage renal disease: Secondary | ICD-10-CM | POA: Diagnosis not present

## 2020-01-26 DIAGNOSIS — N2581 Secondary hyperparathyroidism of renal origin: Secondary | ICD-10-CM | POA: Diagnosis not present

## 2020-01-26 DIAGNOSIS — E876 Hypokalemia: Secondary | ICD-10-CM | POA: Diagnosis not present

## 2020-01-26 DIAGNOSIS — N186 End stage renal disease: Secondary | ICD-10-CM | POA: Diagnosis not present

## 2020-01-26 DIAGNOSIS — E875 Hyperkalemia: Secondary | ICD-10-CM | POA: Diagnosis not present

## 2020-01-26 DIAGNOSIS — Z992 Dependence on renal dialysis: Secondary | ICD-10-CM | POA: Diagnosis not present

## 2020-01-26 DIAGNOSIS — D631 Anemia in chronic kidney disease: Secondary | ICD-10-CM | POA: Diagnosis not present

## 2020-01-27 DIAGNOSIS — Z992 Dependence on renal dialysis: Secondary | ICD-10-CM | POA: Diagnosis not present

## 2020-01-27 DIAGNOSIS — E875 Hyperkalemia: Secondary | ICD-10-CM | POA: Diagnosis not present

## 2020-01-27 DIAGNOSIS — N2581 Secondary hyperparathyroidism of renal origin: Secondary | ICD-10-CM | POA: Diagnosis not present

## 2020-01-27 DIAGNOSIS — N186 End stage renal disease: Secondary | ICD-10-CM | POA: Diagnosis not present

## 2020-01-27 DIAGNOSIS — E876 Hypokalemia: Secondary | ICD-10-CM | POA: Diagnosis not present

## 2020-01-27 DIAGNOSIS — D631 Anemia in chronic kidney disease: Secondary | ICD-10-CM | POA: Diagnosis not present

## 2020-01-28 DIAGNOSIS — E876 Hypokalemia: Secondary | ICD-10-CM | POA: Insufficient documentation

## 2020-01-29 DIAGNOSIS — Z992 Dependence on renal dialysis: Secondary | ICD-10-CM | POA: Diagnosis not present

## 2020-01-29 DIAGNOSIS — E875 Hyperkalemia: Secondary | ICD-10-CM | POA: Diagnosis not present

## 2020-01-29 DIAGNOSIS — D631 Anemia in chronic kidney disease: Secondary | ICD-10-CM | POA: Diagnosis not present

## 2020-01-29 DIAGNOSIS — N186 End stage renal disease: Secondary | ICD-10-CM | POA: Diagnosis not present

## 2020-01-29 DIAGNOSIS — E876 Hypokalemia: Secondary | ICD-10-CM | POA: Diagnosis not present

## 2020-01-29 DIAGNOSIS — N2581 Secondary hyperparathyroidism of renal origin: Secondary | ICD-10-CM | POA: Diagnosis not present

## 2020-01-31 DIAGNOSIS — N2581 Secondary hyperparathyroidism of renal origin: Secondary | ICD-10-CM | POA: Diagnosis not present

## 2020-01-31 DIAGNOSIS — E876 Hypokalemia: Secondary | ICD-10-CM | POA: Diagnosis not present

## 2020-01-31 DIAGNOSIS — D631 Anemia in chronic kidney disease: Secondary | ICD-10-CM | POA: Diagnosis not present

## 2020-01-31 DIAGNOSIS — E875 Hyperkalemia: Secondary | ICD-10-CM | POA: Diagnosis not present

## 2020-01-31 DIAGNOSIS — N186 End stage renal disease: Secondary | ICD-10-CM | POA: Diagnosis not present

## 2020-01-31 DIAGNOSIS — Z992 Dependence on renal dialysis: Secondary | ICD-10-CM | POA: Diagnosis not present

## 2020-02-02 DIAGNOSIS — D631 Anemia in chronic kidney disease: Secondary | ICD-10-CM | POA: Diagnosis not present

## 2020-02-02 DIAGNOSIS — Z992 Dependence on renal dialysis: Secondary | ICD-10-CM | POA: Diagnosis not present

## 2020-02-02 DIAGNOSIS — N2581 Secondary hyperparathyroidism of renal origin: Secondary | ICD-10-CM | POA: Diagnosis not present

## 2020-02-02 DIAGNOSIS — E875 Hyperkalemia: Secondary | ICD-10-CM | POA: Diagnosis not present

## 2020-02-02 DIAGNOSIS — E876 Hypokalemia: Secondary | ICD-10-CM | POA: Diagnosis not present

## 2020-02-02 DIAGNOSIS — N186 End stage renal disease: Secondary | ICD-10-CM | POA: Diagnosis not present

## 2020-02-03 DIAGNOSIS — D631 Anemia in chronic kidney disease: Secondary | ICD-10-CM | POA: Diagnosis not present

## 2020-02-03 DIAGNOSIS — Z992 Dependence on renal dialysis: Secondary | ICD-10-CM | POA: Diagnosis not present

## 2020-02-03 DIAGNOSIS — E876 Hypokalemia: Secondary | ICD-10-CM | POA: Diagnosis not present

## 2020-02-03 DIAGNOSIS — N2581 Secondary hyperparathyroidism of renal origin: Secondary | ICD-10-CM | POA: Diagnosis not present

## 2020-02-03 DIAGNOSIS — E875 Hyperkalemia: Secondary | ICD-10-CM | POA: Diagnosis not present

## 2020-02-03 DIAGNOSIS — N186 End stage renal disease: Secondary | ICD-10-CM | POA: Diagnosis not present

## 2020-02-04 DIAGNOSIS — N041 Nephrotic syndrome with focal and segmental glomerular lesions: Secondary | ICD-10-CM | POA: Diagnosis not present

## 2020-02-04 DIAGNOSIS — N186 End stage renal disease: Secondary | ICD-10-CM | POA: Diagnosis not present

## 2020-02-04 DIAGNOSIS — Z992 Dependence on renal dialysis: Secondary | ICD-10-CM | POA: Diagnosis not present

## 2020-02-05 DIAGNOSIS — N186 End stage renal disease: Secondary | ICD-10-CM | POA: Diagnosis not present

## 2020-02-05 DIAGNOSIS — N2581 Secondary hyperparathyroidism of renal origin: Secondary | ICD-10-CM | POA: Diagnosis not present

## 2020-02-05 DIAGNOSIS — D631 Anemia in chronic kidney disease: Secondary | ICD-10-CM | POA: Diagnosis not present

## 2020-02-05 DIAGNOSIS — Z992 Dependence on renal dialysis: Secondary | ICD-10-CM | POA: Diagnosis not present

## 2020-02-05 DIAGNOSIS — D509 Iron deficiency anemia, unspecified: Secondary | ICD-10-CM | POA: Diagnosis not present

## 2020-02-05 DIAGNOSIS — E8770 Fluid overload, unspecified: Secondary | ICD-10-CM | POA: Diagnosis not present

## 2020-02-05 DIAGNOSIS — E875 Hyperkalemia: Secondary | ICD-10-CM | POA: Diagnosis not present

## 2020-02-07 DIAGNOSIS — Z992 Dependence on renal dialysis: Secondary | ICD-10-CM | POA: Diagnosis not present

## 2020-02-07 DIAGNOSIS — D631 Anemia in chronic kidney disease: Secondary | ICD-10-CM | POA: Diagnosis not present

## 2020-02-07 DIAGNOSIS — N2581 Secondary hyperparathyroidism of renal origin: Secondary | ICD-10-CM | POA: Diagnosis not present

## 2020-02-07 DIAGNOSIS — N186 End stage renal disease: Secondary | ICD-10-CM | POA: Diagnosis not present

## 2020-02-07 DIAGNOSIS — E875 Hyperkalemia: Secondary | ICD-10-CM | POA: Diagnosis not present

## 2020-02-07 DIAGNOSIS — D509 Iron deficiency anemia, unspecified: Secondary | ICD-10-CM | POA: Diagnosis not present

## 2020-02-09 DIAGNOSIS — D509 Iron deficiency anemia, unspecified: Secondary | ICD-10-CM | POA: Diagnosis not present

## 2020-02-09 DIAGNOSIS — D631 Anemia in chronic kidney disease: Secondary | ICD-10-CM | POA: Diagnosis not present

## 2020-02-09 DIAGNOSIS — N2581 Secondary hyperparathyroidism of renal origin: Secondary | ICD-10-CM | POA: Diagnosis not present

## 2020-02-09 DIAGNOSIS — N186 End stage renal disease: Secondary | ICD-10-CM | POA: Diagnosis not present

## 2020-02-09 DIAGNOSIS — E875 Hyperkalemia: Secondary | ICD-10-CM | POA: Diagnosis not present

## 2020-02-09 DIAGNOSIS — Z992 Dependence on renal dialysis: Secondary | ICD-10-CM | POA: Diagnosis not present

## 2020-02-10 DIAGNOSIS — N2581 Secondary hyperparathyroidism of renal origin: Secondary | ICD-10-CM | POA: Diagnosis not present

## 2020-02-10 DIAGNOSIS — D509 Iron deficiency anemia, unspecified: Secondary | ICD-10-CM | POA: Diagnosis not present

## 2020-02-10 DIAGNOSIS — N186 End stage renal disease: Secondary | ICD-10-CM | POA: Diagnosis not present

## 2020-02-10 DIAGNOSIS — Z992 Dependence on renal dialysis: Secondary | ICD-10-CM | POA: Diagnosis not present

## 2020-02-10 DIAGNOSIS — D631 Anemia in chronic kidney disease: Secondary | ICD-10-CM | POA: Diagnosis not present

## 2020-02-10 DIAGNOSIS — E875 Hyperkalemia: Secondary | ICD-10-CM | POA: Diagnosis not present

## 2020-02-12 DIAGNOSIS — D631 Anemia in chronic kidney disease: Secondary | ICD-10-CM | POA: Diagnosis not present

## 2020-02-12 DIAGNOSIS — D509 Iron deficiency anemia, unspecified: Secondary | ICD-10-CM | POA: Diagnosis not present

## 2020-02-12 DIAGNOSIS — Z992 Dependence on renal dialysis: Secondary | ICD-10-CM | POA: Diagnosis not present

## 2020-02-12 DIAGNOSIS — E875 Hyperkalemia: Secondary | ICD-10-CM | POA: Diagnosis not present

## 2020-02-12 DIAGNOSIS — N186 End stage renal disease: Secondary | ICD-10-CM | POA: Diagnosis not present

## 2020-02-12 DIAGNOSIS — N2581 Secondary hyperparathyroidism of renal origin: Secondary | ICD-10-CM | POA: Diagnosis not present

## 2020-02-14 DIAGNOSIS — E875 Hyperkalemia: Secondary | ICD-10-CM | POA: Diagnosis not present

## 2020-02-14 DIAGNOSIS — D631 Anemia in chronic kidney disease: Secondary | ICD-10-CM | POA: Diagnosis not present

## 2020-02-14 DIAGNOSIS — Z992 Dependence on renal dialysis: Secondary | ICD-10-CM | POA: Diagnosis not present

## 2020-02-14 DIAGNOSIS — D509 Iron deficiency anemia, unspecified: Secondary | ICD-10-CM | POA: Diagnosis not present

## 2020-02-14 DIAGNOSIS — N2581 Secondary hyperparathyroidism of renal origin: Secondary | ICD-10-CM | POA: Diagnosis not present

## 2020-02-14 DIAGNOSIS — N186 End stage renal disease: Secondary | ICD-10-CM | POA: Diagnosis not present

## 2020-02-16 DIAGNOSIS — E875 Hyperkalemia: Secondary | ICD-10-CM | POA: Diagnosis not present

## 2020-02-16 DIAGNOSIS — D509 Iron deficiency anemia, unspecified: Secondary | ICD-10-CM | POA: Diagnosis not present

## 2020-02-16 DIAGNOSIS — Z992 Dependence on renal dialysis: Secondary | ICD-10-CM | POA: Diagnosis not present

## 2020-02-16 DIAGNOSIS — D631 Anemia in chronic kidney disease: Secondary | ICD-10-CM | POA: Diagnosis not present

## 2020-02-16 DIAGNOSIS — N186 End stage renal disease: Secondary | ICD-10-CM | POA: Diagnosis not present

## 2020-02-16 DIAGNOSIS — N2581 Secondary hyperparathyroidism of renal origin: Secondary | ICD-10-CM | POA: Diagnosis not present

## 2020-02-17 DIAGNOSIS — D509 Iron deficiency anemia, unspecified: Secondary | ICD-10-CM | POA: Diagnosis not present

## 2020-02-17 DIAGNOSIS — N2581 Secondary hyperparathyroidism of renal origin: Secondary | ICD-10-CM | POA: Diagnosis not present

## 2020-02-17 DIAGNOSIS — Z992 Dependence on renal dialysis: Secondary | ICD-10-CM | POA: Diagnosis not present

## 2020-02-17 DIAGNOSIS — N186 End stage renal disease: Secondary | ICD-10-CM | POA: Diagnosis not present

## 2020-02-17 DIAGNOSIS — D631 Anemia in chronic kidney disease: Secondary | ICD-10-CM | POA: Diagnosis not present

## 2020-02-17 DIAGNOSIS — E875 Hyperkalemia: Secondary | ICD-10-CM | POA: Diagnosis not present

## 2020-02-19 DIAGNOSIS — Z992 Dependence on renal dialysis: Secondary | ICD-10-CM | POA: Diagnosis not present

## 2020-02-19 DIAGNOSIS — E875 Hyperkalemia: Secondary | ICD-10-CM | POA: Diagnosis not present

## 2020-02-19 DIAGNOSIS — N2581 Secondary hyperparathyroidism of renal origin: Secondary | ICD-10-CM | POA: Diagnosis not present

## 2020-02-19 DIAGNOSIS — D631 Anemia in chronic kidney disease: Secondary | ICD-10-CM | POA: Diagnosis not present

## 2020-02-19 DIAGNOSIS — N186 End stage renal disease: Secondary | ICD-10-CM | POA: Diagnosis not present

## 2020-02-19 DIAGNOSIS — D509 Iron deficiency anemia, unspecified: Secondary | ICD-10-CM | POA: Diagnosis not present

## 2020-02-21 DIAGNOSIS — Z1151 Encounter for screening for human papillomavirus (HPV): Secondary | ICD-10-CM | POA: Diagnosis not present

## 2020-02-21 DIAGNOSIS — N2581 Secondary hyperparathyroidism of renal origin: Secondary | ICD-10-CM | POA: Diagnosis not present

## 2020-02-21 DIAGNOSIS — N186 End stage renal disease: Secondary | ICD-10-CM | POA: Diagnosis not present

## 2020-02-21 DIAGNOSIS — D631 Anemia in chronic kidney disease: Secondary | ICD-10-CM | POA: Diagnosis not present

## 2020-02-21 DIAGNOSIS — D509 Iron deficiency anemia, unspecified: Secondary | ICD-10-CM | POA: Diagnosis not present

## 2020-02-21 DIAGNOSIS — Z992 Dependence on renal dialysis: Secondary | ICD-10-CM | POA: Diagnosis not present

## 2020-02-21 DIAGNOSIS — Z1231 Encounter for screening mammogram for malignant neoplasm of breast: Secondary | ICD-10-CM | POA: Diagnosis not present

## 2020-02-21 DIAGNOSIS — E875 Hyperkalemia: Secondary | ICD-10-CM | POA: Diagnosis not present

## 2020-02-21 DIAGNOSIS — Z124 Encounter for screening for malignant neoplasm of cervix: Secondary | ICD-10-CM | POA: Diagnosis not present

## 2020-02-21 DIAGNOSIS — Z01419 Encounter for gynecological examination (general) (routine) without abnormal findings: Secondary | ICD-10-CM | POA: Diagnosis not present

## 2020-02-23 DIAGNOSIS — E875 Hyperkalemia: Secondary | ICD-10-CM | POA: Diagnosis not present

## 2020-02-23 DIAGNOSIS — N186 End stage renal disease: Secondary | ICD-10-CM | POA: Diagnosis not present

## 2020-02-23 DIAGNOSIS — D509 Iron deficiency anemia, unspecified: Secondary | ICD-10-CM | POA: Diagnosis not present

## 2020-02-23 DIAGNOSIS — D631 Anemia in chronic kidney disease: Secondary | ICD-10-CM | POA: Diagnosis not present

## 2020-02-23 DIAGNOSIS — N2581 Secondary hyperparathyroidism of renal origin: Secondary | ICD-10-CM | POA: Diagnosis not present

## 2020-02-23 DIAGNOSIS — Z992 Dependence on renal dialysis: Secondary | ICD-10-CM | POA: Diagnosis not present

## 2020-02-24 DIAGNOSIS — D631 Anemia in chronic kidney disease: Secondary | ICD-10-CM | POA: Diagnosis not present

## 2020-02-24 DIAGNOSIS — Z992 Dependence on renal dialysis: Secondary | ICD-10-CM | POA: Diagnosis not present

## 2020-02-24 DIAGNOSIS — N2581 Secondary hyperparathyroidism of renal origin: Secondary | ICD-10-CM | POA: Diagnosis not present

## 2020-02-24 DIAGNOSIS — D509 Iron deficiency anemia, unspecified: Secondary | ICD-10-CM | POA: Diagnosis not present

## 2020-02-24 DIAGNOSIS — E875 Hyperkalemia: Secondary | ICD-10-CM | POA: Diagnosis not present

## 2020-02-24 DIAGNOSIS — N186 End stage renal disease: Secondary | ICD-10-CM | POA: Diagnosis not present

## 2020-02-26 DIAGNOSIS — D631 Anemia in chronic kidney disease: Secondary | ICD-10-CM | POA: Diagnosis not present

## 2020-02-26 DIAGNOSIS — N2581 Secondary hyperparathyroidism of renal origin: Secondary | ICD-10-CM | POA: Diagnosis not present

## 2020-02-26 DIAGNOSIS — N186 End stage renal disease: Secondary | ICD-10-CM | POA: Diagnosis not present

## 2020-02-26 DIAGNOSIS — D509 Iron deficiency anemia, unspecified: Secondary | ICD-10-CM | POA: Diagnosis not present

## 2020-02-26 DIAGNOSIS — Z992 Dependence on renal dialysis: Secondary | ICD-10-CM | POA: Diagnosis not present

## 2020-02-26 DIAGNOSIS — E875 Hyperkalemia: Secondary | ICD-10-CM | POA: Diagnosis not present

## 2020-02-27 DIAGNOSIS — D631 Anemia in chronic kidney disease: Secondary | ICD-10-CM | POA: Diagnosis not present

## 2020-02-27 DIAGNOSIS — Z992 Dependence on renal dialysis: Secondary | ICD-10-CM | POA: Diagnosis not present

## 2020-02-27 DIAGNOSIS — N186 End stage renal disease: Secondary | ICD-10-CM | POA: Diagnosis not present

## 2020-02-27 DIAGNOSIS — E875 Hyperkalemia: Secondary | ICD-10-CM | POA: Diagnosis not present

## 2020-02-27 DIAGNOSIS — D509 Iron deficiency anemia, unspecified: Secondary | ICD-10-CM | POA: Diagnosis not present

## 2020-02-27 DIAGNOSIS — N2581 Secondary hyperparathyroidism of renal origin: Secondary | ICD-10-CM | POA: Diagnosis not present

## 2020-02-28 DIAGNOSIS — D631 Anemia in chronic kidney disease: Secondary | ICD-10-CM | POA: Diagnosis not present

## 2020-02-28 DIAGNOSIS — N2581 Secondary hyperparathyroidism of renal origin: Secondary | ICD-10-CM | POA: Diagnosis not present

## 2020-02-28 DIAGNOSIS — Z992 Dependence on renal dialysis: Secondary | ICD-10-CM | POA: Diagnosis not present

## 2020-02-28 DIAGNOSIS — E875 Hyperkalemia: Secondary | ICD-10-CM | POA: Diagnosis not present

## 2020-02-28 DIAGNOSIS — D509 Iron deficiency anemia, unspecified: Secondary | ICD-10-CM | POA: Diagnosis not present

## 2020-02-28 DIAGNOSIS — N186 End stage renal disease: Secondary | ICD-10-CM | POA: Diagnosis not present

## 2020-03-01 DIAGNOSIS — E875 Hyperkalemia: Secondary | ICD-10-CM | POA: Diagnosis not present

## 2020-03-01 DIAGNOSIS — D631 Anemia in chronic kidney disease: Secondary | ICD-10-CM | POA: Diagnosis not present

## 2020-03-01 DIAGNOSIS — N186 End stage renal disease: Secondary | ICD-10-CM | POA: Diagnosis not present

## 2020-03-01 DIAGNOSIS — D509 Iron deficiency anemia, unspecified: Secondary | ICD-10-CM | POA: Diagnosis not present

## 2020-03-01 DIAGNOSIS — N2581 Secondary hyperparathyroidism of renal origin: Secondary | ICD-10-CM | POA: Diagnosis not present

## 2020-03-01 DIAGNOSIS — Z992 Dependence on renal dialysis: Secondary | ICD-10-CM | POA: Diagnosis not present

## 2020-03-02 DIAGNOSIS — N2581 Secondary hyperparathyroidism of renal origin: Secondary | ICD-10-CM | POA: Diagnosis not present

## 2020-03-02 DIAGNOSIS — E875 Hyperkalemia: Secondary | ICD-10-CM | POA: Diagnosis not present

## 2020-03-02 DIAGNOSIS — Z992 Dependence on renal dialysis: Secondary | ICD-10-CM | POA: Diagnosis not present

## 2020-03-02 DIAGNOSIS — N186 End stage renal disease: Secondary | ICD-10-CM | POA: Diagnosis not present

## 2020-03-02 DIAGNOSIS — D631 Anemia in chronic kidney disease: Secondary | ICD-10-CM | POA: Diagnosis not present

## 2020-03-02 DIAGNOSIS — D509 Iron deficiency anemia, unspecified: Secondary | ICD-10-CM | POA: Diagnosis not present

## 2020-03-04 DIAGNOSIS — N186 End stage renal disease: Secondary | ICD-10-CM | POA: Diagnosis not present

## 2020-03-04 DIAGNOSIS — D509 Iron deficiency anemia, unspecified: Secondary | ICD-10-CM | POA: Diagnosis not present

## 2020-03-04 DIAGNOSIS — E875 Hyperkalemia: Secondary | ICD-10-CM | POA: Diagnosis not present

## 2020-03-04 DIAGNOSIS — Z992 Dependence on renal dialysis: Secondary | ICD-10-CM | POA: Diagnosis not present

## 2020-03-04 DIAGNOSIS — D631 Anemia in chronic kidney disease: Secondary | ICD-10-CM | POA: Diagnosis not present

## 2020-03-04 DIAGNOSIS — N2581 Secondary hyperparathyroidism of renal origin: Secondary | ICD-10-CM | POA: Diagnosis not present

## 2020-03-05 DIAGNOSIS — D509 Iron deficiency anemia, unspecified: Secondary | ICD-10-CM | POA: Diagnosis not present

## 2020-03-05 DIAGNOSIS — D631 Anemia in chronic kidney disease: Secondary | ICD-10-CM | POA: Diagnosis not present

## 2020-03-05 DIAGNOSIS — N2581 Secondary hyperparathyroidism of renal origin: Secondary | ICD-10-CM | POA: Diagnosis not present

## 2020-03-05 DIAGNOSIS — E875 Hyperkalemia: Secondary | ICD-10-CM | POA: Diagnosis not present

## 2020-03-05 DIAGNOSIS — Z992 Dependence on renal dialysis: Secondary | ICD-10-CM | POA: Diagnosis not present

## 2020-03-05 DIAGNOSIS — N186 End stage renal disease: Secondary | ICD-10-CM | POA: Diagnosis not present

## 2020-03-06 DIAGNOSIS — E8770 Fluid overload, unspecified: Secondary | ICD-10-CM | POA: Diagnosis not present

## 2020-03-06 DIAGNOSIS — D631 Anemia in chronic kidney disease: Secondary | ICD-10-CM | POA: Diagnosis not present

## 2020-03-06 DIAGNOSIS — Z79899 Other long term (current) drug therapy: Secondary | ICD-10-CM | POA: Diagnosis not present

## 2020-03-06 DIAGNOSIS — R17 Unspecified jaundice: Secondary | ICD-10-CM | POA: Diagnosis not present

## 2020-03-06 DIAGNOSIS — E790 Hyperuricemia without signs of inflammatory arthritis and tophaceous disease: Secondary | ICD-10-CM | POA: Diagnosis not present

## 2020-03-06 DIAGNOSIS — Z992 Dependence on renal dialysis: Secondary | ICD-10-CM | POA: Diagnosis not present

## 2020-03-06 DIAGNOSIS — K7689 Other specified diseases of liver: Secondary | ICD-10-CM | POA: Diagnosis not present

## 2020-03-06 DIAGNOSIS — N2581 Secondary hyperparathyroidism of renal origin: Secondary | ICD-10-CM | POA: Diagnosis not present

## 2020-03-06 DIAGNOSIS — D509 Iron deficiency anemia, unspecified: Secondary | ICD-10-CM | POA: Diagnosis not present

## 2020-03-06 DIAGNOSIS — E875 Hyperkalemia: Secondary | ICD-10-CM | POA: Diagnosis not present

## 2020-03-06 DIAGNOSIS — E440 Moderate protein-calorie malnutrition: Secondary | ICD-10-CM | POA: Diagnosis not present

## 2020-03-06 DIAGNOSIS — E890 Postprocedural hypothyroidism: Secondary | ICD-10-CM | POA: Diagnosis not present

## 2020-03-06 DIAGNOSIS — Z4931 Encounter for adequacy testing for hemodialysis: Secondary | ICD-10-CM | POA: Diagnosis not present

## 2020-03-06 DIAGNOSIS — N186 End stage renal disease: Secondary | ICD-10-CM | POA: Diagnosis not present

## 2020-03-06 DIAGNOSIS — Z5189 Encounter for other specified aftercare: Secondary | ICD-10-CM | POA: Diagnosis not present

## 2020-03-08 DIAGNOSIS — E440 Moderate protein-calorie malnutrition: Secondary | ICD-10-CM | POA: Diagnosis not present

## 2020-03-08 DIAGNOSIS — Z4931 Encounter for adequacy testing for hemodialysis: Secondary | ICD-10-CM | POA: Diagnosis not present

## 2020-03-08 DIAGNOSIS — N2581 Secondary hyperparathyroidism of renal origin: Secondary | ICD-10-CM | POA: Diagnosis not present

## 2020-03-08 DIAGNOSIS — D509 Iron deficiency anemia, unspecified: Secondary | ICD-10-CM | POA: Diagnosis not present

## 2020-03-08 DIAGNOSIS — Z992 Dependence on renal dialysis: Secondary | ICD-10-CM | POA: Diagnosis not present

## 2020-03-08 DIAGNOSIS — N186 End stage renal disease: Secondary | ICD-10-CM | POA: Diagnosis not present

## 2020-03-09 DIAGNOSIS — Z4931 Encounter for adequacy testing for hemodialysis: Secondary | ICD-10-CM | POA: Diagnosis not present

## 2020-03-09 DIAGNOSIS — Z992 Dependence on renal dialysis: Secondary | ICD-10-CM | POA: Diagnosis not present

## 2020-03-09 DIAGNOSIS — D509 Iron deficiency anemia, unspecified: Secondary | ICD-10-CM | POA: Diagnosis not present

## 2020-03-09 DIAGNOSIS — E440 Moderate protein-calorie malnutrition: Secondary | ICD-10-CM | POA: Diagnosis not present

## 2020-03-09 DIAGNOSIS — N186 End stage renal disease: Secondary | ICD-10-CM | POA: Diagnosis not present

## 2020-03-09 DIAGNOSIS — N2581 Secondary hyperparathyroidism of renal origin: Secondary | ICD-10-CM | POA: Diagnosis not present

## 2020-03-11 DIAGNOSIS — E440 Moderate protein-calorie malnutrition: Secondary | ICD-10-CM | POA: Diagnosis not present

## 2020-03-11 DIAGNOSIS — Z4931 Encounter for adequacy testing for hemodialysis: Secondary | ICD-10-CM | POA: Diagnosis not present

## 2020-03-11 DIAGNOSIS — N186 End stage renal disease: Secondary | ICD-10-CM | POA: Diagnosis not present

## 2020-03-11 DIAGNOSIS — Z992 Dependence on renal dialysis: Secondary | ICD-10-CM | POA: Diagnosis not present

## 2020-03-11 DIAGNOSIS — N2581 Secondary hyperparathyroidism of renal origin: Secondary | ICD-10-CM | POA: Diagnosis not present

## 2020-03-11 DIAGNOSIS — D509 Iron deficiency anemia, unspecified: Secondary | ICD-10-CM | POA: Diagnosis not present

## 2020-03-12 ENCOUNTER — Encounter: Payer: Self-pay | Admitting: Podiatry

## 2020-03-12 ENCOUNTER — Other Ambulatory Visit: Payer: Self-pay

## 2020-03-12 ENCOUNTER — Ambulatory Visit (INDEPENDENT_AMBULATORY_CARE_PROVIDER_SITE_OTHER): Payer: Medicare Other | Admitting: Podiatry

## 2020-03-12 DIAGNOSIS — M79675 Pain in left toe(s): Secondary | ICD-10-CM | POA: Diagnosis not present

## 2020-03-12 DIAGNOSIS — N186 End stage renal disease: Secondary | ICD-10-CM

## 2020-03-12 DIAGNOSIS — M79674 Pain in right toe(s): Secondary | ICD-10-CM | POA: Diagnosis not present

## 2020-03-12 DIAGNOSIS — L608 Other nail disorders: Secondary | ICD-10-CM | POA: Diagnosis not present

## 2020-03-12 DIAGNOSIS — Z992 Dependence on renal dialysis: Secondary | ICD-10-CM | POA: Diagnosis not present

## 2020-03-12 DIAGNOSIS — B351 Tinea unguium: Secondary | ICD-10-CM

## 2020-03-12 NOTE — Progress Notes (Signed)
This patient returns to my office for at risk foot care.  This patient requires this care by a professional since this patient will be at risk due to having  ESRD and CVA and neuropathy.  This patient is unable to cut nails herself since the patient cannot reach her nails.These nails are painful walking and wearing shoes.  This patient presents for at risk foot care today.  General Appearance  Alert, conversant and in no acute stress.  Vascular  Dorsalis pedis and posterior tibial  pulses are palpable  bilaterally.  Capillary return is within normal limits  bilaterally. Temperature is within normal limits  bilaterally.  Neurologic  Senn-Weinstein monofilament wire test within normal limits  bilaterally. Muscle power within normal limits bilaterally.  Nails Thick disfigured discolored nails with subungual debris  from hallux to fifth toes bilaterally.   Pincer hallux nails  B/L.No evidence of bacterial infection or drainage bilaterally.  Orthopedic  No limitations of motion  feet .  No crepitus or effusions noted.  No bony pathology or digital deformities noted.  Skin  normotropic skin with no porokeratosis noted bilaterally.  No signs of infections or ulcers noted.     Onychomycosis  Pain in right toes  Pain in left toes  Consent was obtained for treatment procedures.   Mechanical debridement of nails 1-5  bilaterally performed with a nail nipper.  Filed with dremel without incident.    Return office visit  3 months                    Told patient to return for periodic foot care and evaluation due to potential at risk complications.   Gardiner Barefoot DPM

## 2020-03-13 DIAGNOSIS — N2581 Secondary hyperparathyroidism of renal origin: Secondary | ICD-10-CM | POA: Diagnosis not present

## 2020-03-13 DIAGNOSIS — N186 End stage renal disease: Secondary | ICD-10-CM | POA: Diagnosis not present

## 2020-03-13 DIAGNOSIS — D509 Iron deficiency anemia, unspecified: Secondary | ICD-10-CM | POA: Diagnosis not present

## 2020-03-13 DIAGNOSIS — Z992 Dependence on renal dialysis: Secondary | ICD-10-CM | POA: Diagnosis not present

## 2020-03-13 DIAGNOSIS — E440 Moderate protein-calorie malnutrition: Secondary | ICD-10-CM | POA: Diagnosis not present

## 2020-03-13 DIAGNOSIS — Z4931 Encounter for adequacy testing for hemodialysis: Secondary | ICD-10-CM | POA: Diagnosis not present

## 2020-03-15 DIAGNOSIS — I701 Atherosclerosis of renal artery: Secondary | ICD-10-CM | POA: Diagnosis not present

## 2020-03-15 DIAGNOSIS — J95851 Ventilator associated pneumonia: Secondary | ICD-10-CM | POA: Diagnosis not present

## 2020-03-15 DIAGNOSIS — Z87891 Personal history of nicotine dependence: Secondary | ICD-10-CM | POA: Diagnosis not present

## 2020-03-15 DIAGNOSIS — I120 Hypertensive chronic kidney disease with stage 5 chronic kidney disease or end stage renal disease: Secondary | ICD-10-CM | POA: Diagnosis not present

## 2020-03-15 DIAGNOSIS — Z85528 Personal history of other malignant neoplasm of kidney: Secondary | ICD-10-CM | POA: Diagnosis not present

## 2020-03-15 DIAGNOSIS — Z781 Physical restraint status: Secondary | ICD-10-CM | POA: Diagnosis not present

## 2020-03-15 DIAGNOSIS — E440 Moderate protein-calorie malnutrition: Secondary | ICD-10-CM | POA: Diagnosis not present

## 2020-03-15 DIAGNOSIS — N170 Acute kidney failure with tubular necrosis: Secondary | ICD-10-CM | POA: Diagnosis not present

## 2020-03-15 DIAGNOSIS — E875 Hyperkalemia: Secondary | ICD-10-CM | POA: Diagnosis not present

## 2020-03-15 DIAGNOSIS — Z960 Presence of urogenital implants: Secondary | ICD-10-CM | POA: Diagnosis not present

## 2020-03-15 DIAGNOSIS — I10 Essential (primary) hypertension: Secondary | ICD-10-CM | POA: Diagnosis present

## 2020-03-15 DIAGNOSIS — D84821 Immunodeficiency due to drugs: Secondary | ICD-10-CM | POA: Diagnosis not present

## 2020-03-15 DIAGNOSIS — Z940 Kidney transplant status: Secondary | ICD-10-CM | POA: Diagnosis not present

## 2020-03-15 DIAGNOSIS — Z1581 Genetic susceptibility to multiple endocrine neoplasia [MEN]: Secondary | ICD-10-CM | POA: Diagnosis not present

## 2020-03-15 DIAGNOSIS — J9602 Acute respiratory failure with hypercapnia: Secondary | ICD-10-CM | POA: Diagnosis not present

## 2020-03-15 DIAGNOSIS — Z905 Acquired absence of kidney: Secondary | ICD-10-CM | POA: Diagnosis not present

## 2020-03-15 DIAGNOSIS — Z01810 Encounter for preprocedural cardiovascular examination: Secondary | ICD-10-CM | POA: Diagnosis not present

## 2020-03-15 DIAGNOSIS — N2581 Secondary hyperparathyroidism of renal origin: Secondary | ICD-10-CM | POA: Diagnosis not present

## 2020-03-15 DIAGNOSIS — N269 Renal sclerosis, unspecified: Secondary | ICD-10-CM | POA: Diagnosis present

## 2020-03-15 DIAGNOSIS — Z823 Family history of stroke: Secondary | ICD-10-CM | POA: Diagnosis not present

## 2020-03-15 DIAGNOSIS — N186 End stage renal disease: Secondary | ICD-10-CM | POA: Diagnosis present

## 2020-03-15 DIAGNOSIS — Z8679 Personal history of other diseases of the circulatory system: Secondary | ICD-10-CM | POA: Diagnosis not present

## 2020-03-15 DIAGNOSIS — Z8249 Family history of ischemic heart disease and other diseases of the circulatory system: Secondary | ICD-10-CM | POA: Diagnosis not present

## 2020-03-15 DIAGNOSIS — D509 Iron deficiency anemia, unspecified: Secondary | ICD-10-CM | POA: Diagnosis not present

## 2020-03-15 DIAGNOSIS — I708 Atherosclerosis of other arteries: Secondary | ICD-10-CM | POA: Diagnosis present

## 2020-03-15 DIAGNOSIS — Z992 Dependence on renal dialysis: Secondary | ICD-10-CM | POA: Diagnosis not present

## 2020-03-15 DIAGNOSIS — E8770 Fluid overload, unspecified: Secondary | ICD-10-CM | POA: Diagnosis not present

## 2020-03-15 DIAGNOSIS — Z809 Family history of malignant neoplasm, unspecified: Secondary | ICD-10-CM | POA: Diagnosis not present

## 2020-03-15 DIAGNOSIS — E890 Postprocedural hypothyroidism: Secondary | ICD-10-CM | POA: Diagnosis present

## 2020-03-15 DIAGNOSIS — D62 Acute posthemorrhagic anemia: Secondary | ICD-10-CM | POA: Diagnosis not present

## 2020-03-15 DIAGNOSIS — R638 Other symptoms and signs concerning food and fluid intake: Secondary | ICD-10-CM | POA: Diagnosis not present

## 2020-03-15 DIAGNOSIS — Z7952 Long term (current) use of systemic steroids: Secondary | ICD-10-CM | POA: Diagnosis not present

## 2020-03-15 DIAGNOSIS — Z79899 Other long term (current) drug therapy: Secondary | ICD-10-CM | POA: Diagnosis not present

## 2020-03-15 DIAGNOSIS — I69354 Hemiplegia and hemiparesis following cerebral infarction affecting left non-dominant side: Secondary | ICD-10-CM | POA: Diagnosis not present

## 2020-03-15 DIAGNOSIS — Z5181 Encounter for therapeutic drug level monitoring: Secondary | ICD-10-CM | POA: Diagnosis not present

## 2020-03-15 DIAGNOSIS — D688 Other specified coagulation defects: Secondary | ICD-10-CM | POA: Diagnosis present

## 2020-03-15 DIAGNOSIS — Z4822 Encounter for aftercare following kidney transplant: Secondary | ICD-10-CM | POA: Diagnosis not present

## 2020-03-15 DIAGNOSIS — D631 Anemia in chronic kidney disease: Secondary | ICD-10-CM | POA: Diagnosis present

## 2020-03-15 DIAGNOSIS — N159 Renal tubulo-interstitial disease, unspecified: Secondary | ICD-10-CM | POA: Diagnosis not present

## 2020-03-15 DIAGNOSIS — Z4931 Encounter for adequacy testing for hemodialysis: Secondary | ICD-10-CM | POA: Diagnosis not present

## 2020-03-15 DIAGNOSIS — I739 Peripheral vascular disease, unspecified: Secondary | ICD-10-CM | POA: Diagnosis not present

## 2020-03-15 DIAGNOSIS — J9811 Atelectasis: Secondary | ICD-10-CM | POA: Diagnosis not present

## 2020-03-16 DIAGNOSIS — Z4931 Encounter for adequacy testing for hemodialysis: Secondary | ICD-10-CM | POA: Diagnosis not present

## 2020-03-16 DIAGNOSIS — Z992 Dependence on renal dialysis: Secondary | ICD-10-CM | POA: Diagnosis not present

## 2020-03-16 DIAGNOSIS — N186 End stage renal disease: Secondary | ICD-10-CM | POA: Diagnosis not present

## 2020-03-16 DIAGNOSIS — D509 Iron deficiency anemia, unspecified: Secondary | ICD-10-CM | POA: Diagnosis not present

## 2020-03-16 DIAGNOSIS — N2581 Secondary hyperparathyroidism of renal origin: Secondary | ICD-10-CM | POA: Diagnosis not present

## 2020-03-16 DIAGNOSIS — E440 Moderate protein-calorie malnutrition: Secondary | ICD-10-CM | POA: Diagnosis not present

## 2020-03-18 DIAGNOSIS — Z4931 Encounter for adequacy testing for hemodialysis: Secondary | ICD-10-CM | POA: Diagnosis not present

## 2020-03-18 DIAGNOSIS — Z992 Dependence on renal dialysis: Secondary | ICD-10-CM | POA: Diagnosis not present

## 2020-03-18 DIAGNOSIS — E440 Moderate protein-calorie malnutrition: Secondary | ICD-10-CM | POA: Diagnosis not present

## 2020-03-18 DIAGNOSIS — D509 Iron deficiency anemia, unspecified: Secondary | ICD-10-CM | POA: Diagnosis not present

## 2020-03-18 DIAGNOSIS — N186 End stage renal disease: Secondary | ICD-10-CM | POA: Diagnosis not present

## 2020-03-18 DIAGNOSIS — N2581 Secondary hyperparathyroidism of renal origin: Secondary | ICD-10-CM | POA: Diagnosis not present

## 2020-03-19 DIAGNOSIS — E440 Moderate protein-calorie malnutrition: Secondary | ICD-10-CM | POA: Diagnosis not present

## 2020-03-19 DIAGNOSIS — D509 Iron deficiency anemia, unspecified: Secondary | ICD-10-CM | POA: Diagnosis not present

## 2020-03-19 DIAGNOSIS — N2581 Secondary hyperparathyroidism of renal origin: Secondary | ICD-10-CM | POA: Diagnosis not present

## 2020-03-19 DIAGNOSIS — N186 End stage renal disease: Secondary | ICD-10-CM | POA: Diagnosis not present

## 2020-03-19 DIAGNOSIS — Z992 Dependence on renal dialysis: Secondary | ICD-10-CM | POA: Diagnosis not present

## 2020-03-19 DIAGNOSIS — Z4931 Encounter for adequacy testing for hemodialysis: Secondary | ICD-10-CM | POA: Diagnosis not present

## 2020-03-20 DIAGNOSIS — E440 Moderate protein-calorie malnutrition: Secondary | ICD-10-CM | POA: Diagnosis not present

## 2020-03-20 DIAGNOSIS — Z992 Dependence on renal dialysis: Secondary | ICD-10-CM | POA: Diagnosis not present

## 2020-03-20 DIAGNOSIS — N186 End stage renal disease: Secondary | ICD-10-CM | POA: Diagnosis not present

## 2020-03-20 DIAGNOSIS — N2581 Secondary hyperparathyroidism of renal origin: Secondary | ICD-10-CM | POA: Diagnosis not present

## 2020-03-20 DIAGNOSIS — D509 Iron deficiency anemia, unspecified: Secondary | ICD-10-CM | POA: Diagnosis not present

## 2020-03-20 DIAGNOSIS — Z4931 Encounter for adequacy testing for hemodialysis: Secondary | ICD-10-CM | POA: Diagnosis not present

## 2020-03-22 DIAGNOSIS — I10 Essential (primary) hypertension: Secondary | ICD-10-CM | POA: Diagnosis not present

## 2020-03-22 DIAGNOSIS — D84821 Immunodeficiency due to drugs: Secondary | ICD-10-CM | POA: Diagnosis not present

## 2020-03-22 DIAGNOSIS — Z4931 Encounter for adequacy testing for hemodialysis: Secondary | ICD-10-CM | POA: Diagnosis not present

## 2020-03-22 DIAGNOSIS — Z888 Allergy status to other drugs, medicaments and biological substances status: Secondary | ICD-10-CM | POA: Diagnosis not present

## 2020-03-22 DIAGNOSIS — Z85528 Personal history of other malignant neoplasm of kidney: Secondary | ICD-10-CM | POA: Diagnosis not present

## 2020-03-22 DIAGNOSIS — Z5181 Encounter for therapeutic drug level monitoring: Secondary | ICD-10-CM | POA: Diagnosis not present

## 2020-03-22 DIAGNOSIS — E440 Moderate protein-calorie malnutrition: Secondary | ICD-10-CM | POA: Diagnosis not present

## 2020-03-22 DIAGNOSIS — T8610 Unspecified complication of kidney transplant: Secondary | ICD-10-CM | POA: Diagnosis not present

## 2020-03-22 DIAGNOSIS — Z79899 Other long term (current) drug therapy: Secondary | ICD-10-CM | POA: Diagnosis not present

## 2020-03-22 DIAGNOSIS — T8619 Other complication of kidney transplant: Secondary | ICD-10-CM | POA: Diagnosis not present

## 2020-03-22 DIAGNOSIS — R34 Anuria and oliguria: Secondary | ICD-10-CM | POA: Diagnosis not present

## 2020-03-22 DIAGNOSIS — D509 Iron deficiency anemia, unspecified: Secondary | ICD-10-CM | POA: Diagnosis not present

## 2020-03-22 DIAGNOSIS — D649 Anemia, unspecified: Secondary | ICD-10-CM | POA: Diagnosis not present

## 2020-03-22 DIAGNOSIS — Z905 Acquired absence of kidney: Secondary | ICD-10-CM | POA: Diagnosis not present

## 2020-03-22 DIAGNOSIS — N186 End stage renal disease: Secondary | ICD-10-CM | POA: Diagnosis not present

## 2020-03-22 DIAGNOSIS — N2581 Secondary hyperparathyroidism of renal origin: Secondary | ICD-10-CM | POA: Diagnosis not present

## 2020-03-22 DIAGNOSIS — Z4822 Encounter for aftercare following kidney transplant: Secondary | ICD-10-CM | POA: Diagnosis not present

## 2020-03-22 DIAGNOSIS — Z992 Dependence on renal dialysis: Secondary | ICD-10-CM | POA: Diagnosis not present

## 2020-03-22 DIAGNOSIS — I69354 Hemiplegia and hemiparesis following cerebral infarction affecting left non-dominant side: Secondary | ICD-10-CM | POA: Diagnosis not present

## 2020-03-23 DIAGNOSIS — N186 End stage renal disease: Secondary | ICD-10-CM | POA: Diagnosis not present

## 2020-03-23 DIAGNOSIS — N2581 Secondary hyperparathyroidism of renal origin: Secondary | ICD-10-CM | POA: Diagnosis not present

## 2020-03-23 DIAGNOSIS — Z4931 Encounter for adequacy testing for hemodialysis: Secondary | ICD-10-CM | POA: Diagnosis not present

## 2020-03-23 DIAGNOSIS — D509 Iron deficiency anemia, unspecified: Secondary | ICD-10-CM | POA: Diagnosis not present

## 2020-03-23 DIAGNOSIS — E440 Moderate protein-calorie malnutrition: Secondary | ICD-10-CM | POA: Diagnosis not present

## 2020-03-23 DIAGNOSIS — Z992 Dependence on renal dialysis: Secondary | ICD-10-CM | POA: Diagnosis not present

## 2020-03-25 DIAGNOSIS — D509 Iron deficiency anemia, unspecified: Secondary | ICD-10-CM | POA: Diagnosis not present

## 2020-03-25 DIAGNOSIS — I10 Essential (primary) hypertension: Secondary | ICD-10-CM | POA: Diagnosis not present

## 2020-03-25 DIAGNOSIS — Z87891 Personal history of nicotine dependence: Secondary | ICD-10-CM | POA: Diagnosis not present

## 2020-03-25 DIAGNOSIS — G8194 Hemiplegia, unspecified affecting left nondominant side: Secondary | ICD-10-CM | POA: Diagnosis not present

## 2020-03-25 DIAGNOSIS — I639 Cerebral infarction, unspecified: Secondary | ICD-10-CM | POA: Diagnosis not present

## 2020-03-25 DIAGNOSIS — Z4931 Encounter for adequacy testing for hemodialysis: Secondary | ICD-10-CM | POA: Diagnosis not present

## 2020-03-25 DIAGNOSIS — E440 Moderate protein-calorie malnutrition: Secondary | ICD-10-CM | POA: Diagnosis not present

## 2020-03-25 DIAGNOSIS — Z992 Dependence on renal dialysis: Secondary | ICD-10-CM | POA: Diagnosis not present

## 2020-03-25 DIAGNOSIS — N186 End stage renal disease: Secondary | ICD-10-CM | POA: Diagnosis not present

## 2020-03-25 DIAGNOSIS — E890 Postprocedural hypothyroidism: Secondary | ICD-10-CM | POA: Diagnosis not present

## 2020-03-25 DIAGNOSIS — Z85528 Personal history of other malignant neoplasm of kidney: Secondary | ICD-10-CM | POA: Diagnosis not present

## 2020-03-25 DIAGNOSIS — N2581 Secondary hyperparathyroidism of renal origin: Secondary | ICD-10-CM | POA: Diagnosis not present

## 2020-03-25 DIAGNOSIS — N1370 Vesicoureteral-reflux, unspecified: Secondary | ICD-10-CM | POA: Diagnosis not present

## 2020-03-25 DIAGNOSIS — Z940 Kidney transplant status: Secondary | ICD-10-CM | POA: Diagnosis not present

## 2020-03-25 DIAGNOSIS — Z79899 Other long term (current) drug therapy: Secondary | ICD-10-CM | POA: Diagnosis not present

## 2020-03-25 DIAGNOSIS — T8619 Other complication of kidney transplant: Secondary | ICD-10-CM | POA: Diagnosis not present

## 2020-03-25 DIAGNOSIS — Z4822 Encounter for aftercare following kidney transplant: Secondary | ICD-10-CM | POA: Diagnosis not present

## 2020-03-26 DIAGNOSIS — Z4931 Encounter for adequacy testing for hemodialysis: Secondary | ICD-10-CM | POA: Diagnosis not present

## 2020-03-26 DIAGNOSIS — N186 End stage renal disease: Secondary | ICD-10-CM | POA: Diagnosis not present

## 2020-03-26 DIAGNOSIS — N2581 Secondary hyperparathyroidism of renal origin: Secondary | ICD-10-CM | POA: Diagnosis not present

## 2020-03-26 DIAGNOSIS — D509 Iron deficiency anemia, unspecified: Secondary | ICD-10-CM | POA: Diagnosis not present

## 2020-03-26 DIAGNOSIS — E440 Moderate protein-calorie malnutrition: Secondary | ICD-10-CM | POA: Diagnosis not present

## 2020-03-26 DIAGNOSIS — Z992 Dependence on renal dialysis: Secondary | ICD-10-CM | POA: Diagnosis not present

## 2020-03-27 DIAGNOSIS — Z7952 Long term (current) use of systemic steroids: Secondary | ICD-10-CM | POA: Diagnosis not present

## 2020-03-27 DIAGNOSIS — E440 Moderate protein-calorie malnutrition: Secondary | ICD-10-CM | POA: Diagnosis not present

## 2020-03-27 DIAGNOSIS — N051 Unspecified nephritic syndrome with focal and segmental glomerular lesions: Secondary | ICD-10-CM | POA: Diagnosis not present

## 2020-03-27 DIAGNOSIS — Z888 Allergy status to other drugs, medicaments and biological substances status: Secondary | ICD-10-CM | POA: Diagnosis not present

## 2020-03-27 DIAGNOSIS — C642 Malignant neoplasm of left kidney, except renal pelvis: Secondary | ICD-10-CM | POA: Diagnosis not present

## 2020-03-27 DIAGNOSIS — E032 Hypothyroidism due to medicaments and other exogenous substances: Secondary | ICD-10-CM | POA: Diagnosis not present

## 2020-03-27 DIAGNOSIS — D649 Anemia, unspecified: Secondary | ICD-10-CM | POA: Diagnosis not present

## 2020-03-27 DIAGNOSIS — N186 End stage renal disease: Secondary | ICD-10-CM | POA: Diagnosis not present

## 2020-03-27 DIAGNOSIS — D509 Iron deficiency anemia, unspecified: Secondary | ICD-10-CM | POA: Diagnosis not present

## 2020-03-27 DIAGNOSIS — Z992 Dependence on renal dialysis: Secondary | ICD-10-CM | POA: Diagnosis not present

## 2020-03-27 DIAGNOSIS — K650 Generalized (acute) peritonitis: Secondary | ICD-10-CM | POA: Diagnosis not present

## 2020-03-27 DIAGNOSIS — N2581 Secondary hyperparathyroidism of renal origin: Secondary | ICD-10-CM | POA: Diagnosis not present

## 2020-03-27 DIAGNOSIS — N041 Nephrotic syndrome with focal and segmental glomerular lesions: Secondary | ICD-10-CM | POA: Diagnosis not present

## 2020-03-27 DIAGNOSIS — E876 Hypokalemia: Secondary | ICD-10-CM | POA: Diagnosis not present

## 2020-03-27 DIAGNOSIS — Z79899 Other long term (current) drug therapy: Secondary | ICD-10-CM | POA: Diagnosis not present

## 2020-03-27 DIAGNOSIS — Z4822 Encounter for aftercare following kidney transplant: Secondary | ICD-10-CM | POA: Diagnosis not present

## 2020-03-27 DIAGNOSIS — K659 Peritonitis, unspecified: Secondary | ICD-10-CM | POA: Diagnosis not present

## 2020-03-27 DIAGNOSIS — Z4931 Encounter for adequacy testing for hemodialysis: Secondary | ICD-10-CM | POA: Diagnosis not present

## 2020-03-27 DIAGNOSIS — I10 Essential (primary) hypertension: Secondary | ICD-10-CM | POA: Diagnosis not present

## 2020-03-27 DIAGNOSIS — N2589 Other disorders resulting from impaired renal tubular function: Secondary | ICD-10-CM | POA: Diagnosis not present

## 2020-03-27 DIAGNOSIS — I739 Peripheral vascular disease, unspecified: Secondary | ICD-10-CM | POA: Diagnosis not present

## 2020-03-27 DIAGNOSIS — I69354 Hemiplegia and hemiparesis following cerebral infarction affecting left non-dominant side: Secondary | ICD-10-CM | POA: Diagnosis not present

## 2020-03-27 DIAGNOSIS — Z905 Acquired absence of kidney: Secondary | ICD-10-CM | POA: Diagnosis not present

## 2020-03-29 DIAGNOSIS — Z4931 Encounter for adequacy testing for hemodialysis: Secondary | ICD-10-CM | POA: Diagnosis not present

## 2020-03-29 DIAGNOSIS — N051 Unspecified nephritic syndrome with focal and segmental glomerular lesions: Secondary | ICD-10-CM | POA: Diagnosis not present

## 2020-03-29 DIAGNOSIS — G8194 Hemiplegia, unspecified affecting left nondominant side: Secondary | ICD-10-CM | POA: Diagnosis not present

## 2020-03-29 DIAGNOSIS — D649 Anemia, unspecified: Secondary | ICD-10-CM | POA: Diagnosis not present

## 2020-03-29 DIAGNOSIS — E440 Moderate protein-calorie malnutrition: Secondary | ICD-10-CM | POA: Diagnosis not present

## 2020-03-29 DIAGNOSIS — Z8673 Personal history of transient ischemic attack (TIA), and cerebral infarction without residual deficits: Secondary | ICD-10-CM | POA: Diagnosis not present

## 2020-03-29 DIAGNOSIS — D509 Iron deficiency anemia, unspecified: Secondary | ICD-10-CM | POA: Diagnosis not present

## 2020-03-29 DIAGNOSIS — E872 Acidosis: Secondary | ICD-10-CM | POA: Diagnosis not present

## 2020-03-29 DIAGNOSIS — N2581 Secondary hyperparathyroidism of renal origin: Secondary | ICD-10-CM | POA: Diagnosis not present

## 2020-03-29 DIAGNOSIS — I10 Essential (primary) hypertension: Secondary | ICD-10-CM | POA: Diagnosis not present

## 2020-03-29 DIAGNOSIS — Z940 Kidney transplant status: Secondary | ICD-10-CM | POA: Diagnosis not present

## 2020-03-29 DIAGNOSIS — I770 Arteriovenous fistula, acquired: Secondary | ICD-10-CM | POA: Diagnosis not present

## 2020-03-29 DIAGNOSIS — Z5181 Encounter for therapeutic drug level monitoring: Secondary | ICD-10-CM | POA: Diagnosis not present

## 2020-03-29 DIAGNOSIS — D849 Immunodeficiency, unspecified: Secondary | ICD-10-CM | POA: Diagnosis not present

## 2020-03-29 DIAGNOSIS — E876 Hypokalemia: Secondary | ICD-10-CM | POA: Diagnosis not present

## 2020-03-29 DIAGNOSIS — Z7952 Long term (current) use of systemic steroids: Secondary | ICD-10-CM | POA: Diagnosis not present

## 2020-03-29 DIAGNOSIS — I6930 Unspecified sequelae of cerebral infarction: Secondary | ICD-10-CM | POA: Diagnosis not present

## 2020-03-29 DIAGNOSIS — R197 Diarrhea, unspecified: Secondary | ICD-10-CM | POA: Diagnosis not present

## 2020-03-29 DIAGNOSIS — Z4822 Encounter for aftercare following kidney transplant: Secondary | ICD-10-CM | POA: Diagnosis not present

## 2020-03-29 DIAGNOSIS — Z79899 Other long term (current) drug therapy: Secondary | ICD-10-CM | POA: Diagnosis not present

## 2020-03-29 DIAGNOSIS — Z992 Dependence on renal dialysis: Secondary | ICD-10-CM | POA: Diagnosis not present

## 2020-03-29 DIAGNOSIS — D72829 Elevated white blood cell count, unspecified: Secondary | ICD-10-CM | POA: Diagnosis not present

## 2020-03-29 DIAGNOSIS — E869 Volume depletion, unspecified: Secondary | ICD-10-CM | POA: Diagnosis not present

## 2020-03-29 DIAGNOSIS — T8619 Other complication of kidney transplant: Secondary | ICD-10-CM | POA: Diagnosis not present

## 2020-03-29 DIAGNOSIS — Z792 Long term (current) use of antibiotics: Secondary | ICD-10-CM | POA: Diagnosis not present

## 2020-03-29 DIAGNOSIS — N186 End stage renal disease: Secondary | ICD-10-CM | POA: Diagnosis not present

## 2020-03-30 DIAGNOSIS — N186 End stage renal disease: Secondary | ICD-10-CM | POA: Diagnosis not present

## 2020-03-30 DIAGNOSIS — Z4931 Encounter for adequacy testing for hemodialysis: Secondary | ICD-10-CM | POA: Diagnosis not present

## 2020-03-30 DIAGNOSIS — Z992 Dependence on renal dialysis: Secondary | ICD-10-CM | POA: Diagnosis not present

## 2020-03-30 DIAGNOSIS — N2581 Secondary hyperparathyroidism of renal origin: Secondary | ICD-10-CM | POA: Diagnosis not present

## 2020-03-30 DIAGNOSIS — D509 Iron deficiency anemia, unspecified: Secondary | ICD-10-CM | POA: Diagnosis not present

## 2020-03-30 DIAGNOSIS — E440 Moderate protein-calorie malnutrition: Secondary | ICD-10-CM | POA: Diagnosis not present

## 2020-04-01 DIAGNOSIS — Z4822 Encounter for aftercare following kidney transplant: Secondary | ICD-10-CM | POA: Diagnosis not present

## 2020-04-01 DIAGNOSIS — D6859 Other primary thrombophilia: Secondary | ICD-10-CM | POA: Diagnosis not present

## 2020-04-01 DIAGNOSIS — D631 Anemia in chronic kidney disease: Secondary | ICD-10-CM | POA: Diagnosis not present

## 2020-04-01 DIAGNOSIS — Z85528 Personal history of other malignant neoplasm of kidney: Secondary | ICD-10-CM | POA: Diagnosis not present

## 2020-04-01 DIAGNOSIS — I10 Essential (primary) hypertension: Secondary | ICD-10-CM | POA: Diagnosis not present

## 2020-04-01 DIAGNOSIS — E872 Acidosis: Secondary | ICD-10-CM | POA: Diagnosis not present

## 2020-04-01 DIAGNOSIS — T8619 Other complication of kidney transplant: Secondary | ICD-10-CM | POA: Diagnosis not present

## 2020-04-01 DIAGNOSIS — Z992 Dependence on renal dialysis: Secondary | ICD-10-CM | POA: Diagnosis not present

## 2020-04-01 DIAGNOSIS — D72829 Elevated white blood cell count, unspecified: Secondary | ICD-10-CM | POA: Diagnosis not present

## 2020-04-01 DIAGNOSIS — N2581 Secondary hyperparathyroidism of renal origin: Secondary | ICD-10-CM | POA: Diagnosis not present

## 2020-04-01 DIAGNOSIS — D8989 Other specified disorders involving the immune mechanism, not elsewhere classified: Secondary | ICD-10-CM | POA: Diagnosis not present

## 2020-04-01 DIAGNOSIS — D649 Anemia, unspecified: Secondary | ICD-10-CM | POA: Diagnosis not present

## 2020-04-01 DIAGNOSIS — N186 End stage renal disease: Secondary | ICD-10-CM | POA: Diagnosis not present

## 2020-04-01 DIAGNOSIS — Z905 Acquired absence of kidney: Secondary | ICD-10-CM | POA: Diagnosis not present

## 2020-04-01 DIAGNOSIS — Z792 Long term (current) use of antibiotics: Secondary | ICD-10-CM | POA: Diagnosis not present

## 2020-04-01 DIAGNOSIS — I120 Hypertensive chronic kidney disease with stage 5 chronic kidney disease or end stage renal disease: Secondary | ICD-10-CM | POA: Diagnosis not present

## 2020-04-01 DIAGNOSIS — I69354 Hemiplegia and hemiparesis following cerebral infarction affecting left non-dominant side: Secondary | ICD-10-CM | POA: Diagnosis not present

## 2020-04-01 DIAGNOSIS — Z79899 Other long term (current) drug therapy: Secondary | ICD-10-CM | POA: Diagnosis not present

## 2020-04-01 DIAGNOSIS — R197 Diarrhea, unspecified: Secondary | ICD-10-CM | POA: Diagnosis not present

## 2020-04-01 DIAGNOSIS — Z7952 Long term (current) use of systemic steroids: Secondary | ICD-10-CM | POA: Diagnosis not present

## 2020-04-01 DIAGNOSIS — Z4931 Encounter for adequacy testing for hemodialysis: Secondary | ICD-10-CM | POA: Diagnosis not present

## 2020-04-01 DIAGNOSIS — E440 Moderate protein-calorie malnutrition: Secondary | ICD-10-CM | POA: Diagnosis not present

## 2020-04-01 DIAGNOSIS — Z940 Kidney transplant status: Secondary | ICD-10-CM | POA: Diagnosis not present

## 2020-04-01 DIAGNOSIS — E876 Hypokalemia: Secondary | ICD-10-CM | POA: Diagnosis not present

## 2020-04-01 DIAGNOSIS — D509 Iron deficiency anemia, unspecified: Secondary | ICD-10-CM | POA: Diagnosis not present

## 2020-04-04 DIAGNOSIS — K521 Toxic gastroenteritis and colitis: Secondary | ICD-10-CM | POA: Diagnosis not present

## 2020-04-04 DIAGNOSIS — Z4822 Encounter for aftercare following kidney transplant: Secondary | ICD-10-CM | POA: Diagnosis not present

## 2020-04-04 DIAGNOSIS — I120 Hypertensive chronic kidney disease with stage 5 chronic kidney disease or end stage renal disease: Secondary | ICD-10-CM | POA: Diagnosis not present

## 2020-04-04 DIAGNOSIS — Z79899 Other long term (current) drug therapy: Secondary | ICD-10-CM | POA: Diagnosis not present

## 2020-04-04 DIAGNOSIS — N186 End stage renal disease: Secondary | ICD-10-CM | POA: Diagnosis not present

## 2020-04-04 DIAGNOSIS — E1122 Type 2 diabetes mellitus with diabetic chronic kidney disease: Secondary | ICD-10-CM | POA: Diagnosis not present

## 2020-04-04 DIAGNOSIS — Z7952 Long term (current) use of systemic steroids: Secondary | ICD-10-CM | POA: Diagnosis not present

## 2020-04-04 DIAGNOSIS — D84821 Immunodeficiency due to drugs: Secondary | ICD-10-CM | POA: Diagnosis not present

## 2020-04-04 DIAGNOSIS — D631 Anemia in chronic kidney disease: Secondary | ICD-10-CM | POA: Diagnosis not present

## 2020-04-09 DIAGNOSIS — I10 Essential (primary) hypertension: Secondary | ICD-10-CM | POA: Diagnosis not present

## 2020-04-09 DIAGNOSIS — Z940 Kidney transplant status: Secondary | ICD-10-CM | POA: Diagnosis not present

## 2020-04-09 DIAGNOSIS — Z7952 Long term (current) use of systemic steroids: Secondary | ICD-10-CM | POA: Diagnosis not present

## 2020-04-09 DIAGNOSIS — Z905 Acquired absence of kidney: Secondary | ICD-10-CM | POA: Diagnosis not present

## 2020-04-09 DIAGNOSIS — D649 Anemia, unspecified: Secondary | ICD-10-CM | POA: Diagnosis not present

## 2020-04-09 DIAGNOSIS — Z85528 Personal history of other malignant neoplasm of kidney: Secondary | ICD-10-CM | POA: Diagnosis not present

## 2020-04-09 DIAGNOSIS — I69354 Hemiplegia and hemiparesis following cerebral infarction affecting left non-dominant side: Secondary | ICD-10-CM | POA: Diagnosis not present

## 2020-04-09 DIAGNOSIS — Z79899 Other long term (current) drug therapy: Secondary | ICD-10-CM | POA: Diagnosis not present

## 2020-04-09 DIAGNOSIS — E890 Postprocedural hypothyroidism: Secondary | ICD-10-CM | POA: Diagnosis not present

## 2020-04-09 DIAGNOSIS — D8989 Other specified disorders involving the immune mechanism, not elsewhere classified: Secondary | ICD-10-CM | POA: Diagnosis not present

## 2020-04-09 DIAGNOSIS — D72829 Elevated white blood cell count, unspecified: Secondary | ICD-10-CM | POA: Diagnosis not present

## 2020-04-09 DIAGNOSIS — Z792 Long term (current) use of antibiotics: Secondary | ICD-10-CM | POA: Diagnosis not present

## 2020-04-09 DIAGNOSIS — Z4822 Encounter for aftercare following kidney transplant: Secondary | ICD-10-CM | POA: Diagnosis not present

## 2020-04-12 DIAGNOSIS — Z940 Kidney transplant status: Secondary | ICD-10-CM | POA: Diagnosis not present

## 2020-04-12 DIAGNOSIS — I69354 Hemiplegia and hemiparesis following cerebral infarction affecting left non-dominant side: Secondary | ICD-10-CM | POA: Diagnosis not present

## 2020-04-12 DIAGNOSIS — Z85528 Personal history of other malignant neoplasm of kidney: Secondary | ICD-10-CM | POA: Diagnosis not present

## 2020-04-12 DIAGNOSIS — I739 Peripheral vascular disease, unspecified: Secondary | ICD-10-CM | POA: Diagnosis not present

## 2020-04-12 DIAGNOSIS — D649 Anemia, unspecified: Secondary | ICD-10-CM | POA: Diagnosis not present

## 2020-04-12 DIAGNOSIS — I951 Orthostatic hypotension: Secondary | ICD-10-CM | POA: Diagnosis not present

## 2020-04-12 DIAGNOSIS — I10 Essential (primary) hypertension: Secondary | ICD-10-CM | POA: Diagnosis not present

## 2020-04-12 DIAGNOSIS — R634 Abnormal weight loss: Secondary | ICD-10-CM | POA: Diagnosis not present

## 2020-04-12 DIAGNOSIS — Z8673 Personal history of transient ischemic attack (TIA), and cerebral infarction without residual deficits: Secondary | ICD-10-CM | POA: Diagnosis not present

## 2020-04-12 DIAGNOSIS — Z79899 Other long term (current) drug therapy: Secondary | ICD-10-CM | POA: Diagnosis not present

## 2020-04-12 DIAGNOSIS — Z4822 Encounter for aftercare following kidney transplant: Secondary | ICD-10-CM | POA: Diagnosis not present

## 2020-04-12 DIAGNOSIS — Z792 Long term (current) use of antibiotics: Secondary | ICD-10-CM | POA: Diagnosis not present

## 2020-04-12 DIAGNOSIS — Z7952 Long term (current) use of systemic steroids: Secondary | ICD-10-CM | POA: Diagnosis not present

## 2020-04-12 DIAGNOSIS — D8989 Other specified disorders involving the immune mechanism, not elsewhere classified: Secondary | ICD-10-CM | POA: Diagnosis not present

## 2020-04-16 DIAGNOSIS — D631 Anemia in chronic kidney disease: Secondary | ICD-10-CM | POA: Diagnosis not present

## 2020-04-16 DIAGNOSIS — I151 Hypertension secondary to other renal disorders: Secondary | ICD-10-CM | POA: Diagnosis not present

## 2020-04-16 DIAGNOSIS — N2889 Other specified disorders of kidney and ureter: Secondary | ICD-10-CM | POA: Diagnosis not present

## 2020-04-16 DIAGNOSIS — D649 Anemia, unspecified: Secondary | ICD-10-CM | POA: Diagnosis not present

## 2020-04-16 DIAGNOSIS — D849 Immunodeficiency, unspecified: Secondary | ICD-10-CM | POA: Diagnosis not present

## 2020-04-16 DIAGNOSIS — N2581 Secondary hyperparathyroidism of renal origin: Secondary | ICD-10-CM | POA: Diagnosis not present

## 2020-04-16 DIAGNOSIS — Z7952 Long term (current) use of systemic steroids: Secondary | ICD-10-CM | POA: Diagnosis not present

## 2020-04-16 DIAGNOSIS — G8114 Spastic hemiplegia affecting left nondominant side: Secondary | ICD-10-CM | POA: Diagnosis not present

## 2020-04-16 DIAGNOSIS — I10 Essential (primary) hypertension: Secondary | ICD-10-CM | POA: Diagnosis not present

## 2020-04-16 DIAGNOSIS — Z992 Dependence on renal dialysis: Secondary | ICD-10-CM | POA: Diagnosis not present

## 2020-04-16 DIAGNOSIS — N186 End stage renal disease: Secondary | ICD-10-CM | POA: Diagnosis not present

## 2020-04-16 DIAGNOSIS — Z79899 Other long term (current) drug therapy: Secondary | ICD-10-CM | POA: Diagnosis not present

## 2020-04-16 DIAGNOSIS — Z888 Allergy status to other drugs, medicaments and biological substances status: Secondary | ICD-10-CM | POA: Diagnosis not present

## 2020-04-16 DIAGNOSIS — Z940 Kidney transplant status: Secondary | ICD-10-CM | POA: Diagnosis not present

## 2020-04-19 DIAGNOSIS — Z960 Presence of urogenital implants: Secondary | ICD-10-CM | POA: Diagnosis not present

## 2020-04-19 DIAGNOSIS — Z4822 Encounter for aftercare following kidney transplant: Secondary | ICD-10-CM | POA: Diagnosis not present

## 2020-04-19 DIAGNOSIS — Z940 Kidney transplant status: Secondary | ICD-10-CM | POA: Diagnosis not present

## 2020-04-22 DIAGNOSIS — I10 Essential (primary) hypertension: Secondary | ICD-10-CM | POA: Diagnosis not present

## 2020-04-22 DIAGNOSIS — Z8679 Personal history of other diseases of the circulatory system: Secondary | ICD-10-CM | POA: Diagnosis not present

## 2020-04-22 DIAGNOSIS — Z87891 Personal history of nicotine dependence: Secondary | ICD-10-CM | POA: Diagnosis not present

## 2020-04-22 DIAGNOSIS — I739 Peripheral vascular disease, unspecified: Secondary | ICD-10-CM | POA: Diagnosis not present

## 2020-04-23 DIAGNOSIS — Z792 Long term (current) use of antibiotics: Secondary | ICD-10-CM | POA: Diagnosis not present

## 2020-04-23 DIAGNOSIS — I10 Essential (primary) hypertension: Secondary | ICD-10-CM | POA: Diagnosis not present

## 2020-04-23 DIAGNOSIS — Z79899 Other long term (current) drug therapy: Secondary | ICD-10-CM | POA: Diagnosis not present

## 2020-04-23 DIAGNOSIS — Z4822 Encounter for aftercare following kidney transplant: Secondary | ICD-10-CM | POA: Diagnosis not present

## 2020-04-23 DIAGNOSIS — Z940 Kidney transplant status: Secondary | ICD-10-CM | POA: Diagnosis not present

## 2020-04-23 DIAGNOSIS — Z862 Personal history of diseases of the blood and blood-forming organs and certain disorders involving the immune mechanism: Secondary | ICD-10-CM | POA: Diagnosis not present

## 2020-04-23 DIAGNOSIS — D649 Anemia, unspecified: Secondary | ICD-10-CM | POA: Diagnosis not present

## 2020-04-23 DIAGNOSIS — Z85528 Personal history of other malignant neoplasm of kidney: Secondary | ICD-10-CM | POA: Diagnosis not present

## 2020-04-23 DIAGNOSIS — Z8673 Personal history of transient ischemic attack (TIA), and cerebral infarction without residual deficits: Secondary | ICD-10-CM | POA: Diagnosis not present

## 2020-04-23 DIAGNOSIS — Z7952 Long term (current) use of systemic steroids: Secondary | ICD-10-CM | POA: Diagnosis not present

## 2020-04-23 DIAGNOSIS — I69354 Hemiplegia and hemiparesis following cerebral infarction affecting left non-dominant side: Secondary | ICD-10-CM | POA: Diagnosis not present

## 2020-04-23 DIAGNOSIS — D849 Immunodeficiency, unspecified: Secondary | ICD-10-CM | POA: Diagnosis not present

## 2020-04-23 DIAGNOSIS — I7772 Dissection of iliac artery: Secondary | ICD-10-CM | POA: Diagnosis not present

## 2020-04-23 DIAGNOSIS — Z9889 Other specified postprocedural states: Secondary | ICD-10-CM | POA: Diagnosis not present

## 2020-04-25 DIAGNOSIS — Z79899 Other long term (current) drug therapy: Secondary | ICD-10-CM | POA: Diagnosis not present

## 2020-04-25 DIAGNOSIS — Z4822 Encounter for aftercare following kidney transplant: Secondary | ICD-10-CM | POA: Diagnosis not present

## 2020-04-25 DIAGNOSIS — N261 Atrophy of kidney (terminal): Secondary | ICD-10-CM | POA: Diagnosis not present

## 2020-04-25 DIAGNOSIS — Z940 Kidney transplant status: Secondary | ICD-10-CM | POA: Diagnosis not present

## 2020-04-30 DIAGNOSIS — Z466 Encounter for fitting and adjustment of urinary device: Secondary | ICD-10-CM | POA: Diagnosis not present

## 2020-04-30 DIAGNOSIS — Z7952 Long term (current) use of systemic steroids: Secondary | ICD-10-CM | POA: Diagnosis not present

## 2020-04-30 DIAGNOSIS — Z940 Kidney transplant status: Secondary | ICD-10-CM | POA: Diagnosis not present

## 2020-04-30 DIAGNOSIS — Z79899 Other long term (current) drug therapy: Secondary | ICD-10-CM | POA: Diagnosis not present

## 2020-04-30 DIAGNOSIS — Z4822 Encounter for aftercare following kidney transplant: Secondary | ICD-10-CM | POA: Diagnosis not present

## 2020-04-30 DIAGNOSIS — D649 Anemia, unspecified: Secondary | ICD-10-CM | POA: Diagnosis not present

## 2020-04-30 DIAGNOSIS — I69954 Hemiplegia and hemiparesis following unspecified cerebrovascular disease affecting left non-dominant side: Secondary | ICD-10-CM | POA: Diagnosis not present

## 2020-04-30 DIAGNOSIS — I10 Essential (primary) hypertension: Secondary | ICD-10-CM | POA: Diagnosis not present

## 2020-04-30 DIAGNOSIS — Z792 Long term (current) use of antibiotics: Secondary | ICD-10-CM | POA: Diagnosis not present

## 2020-04-30 DIAGNOSIS — Z5181 Encounter for therapeutic drug level monitoring: Secondary | ICD-10-CM | POA: Diagnosis not present

## 2020-04-30 DIAGNOSIS — Z85528 Personal history of other malignant neoplasm of kidney: Secondary | ICD-10-CM | POA: Diagnosis not present

## 2020-05-14 DIAGNOSIS — I69354 Hemiplegia and hemiparesis following cerebral infarction affecting left non-dominant side: Secondary | ICD-10-CM | POA: Diagnosis not present

## 2020-05-14 DIAGNOSIS — Z792 Long term (current) use of antibiotics: Secondary | ICD-10-CM | POA: Diagnosis not present

## 2020-05-14 DIAGNOSIS — Z7952 Long term (current) use of systemic steroids: Secondary | ICD-10-CM | POA: Diagnosis not present

## 2020-05-14 DIAGNOSIS — Z85528 Personal history of other malignant neoplasm of kidney: Secondary | ICD-10-CM | POA: Diagnosis not present

## 2020-05-14 DIAGNOSIS — I998 Other disorder of circulatory system: Secondary | ICD-10-CM | POA: Diagnosis not present

## 2020-05-14 DIAGNOSIS — Z79899 Other long term (current) drug therapy: Secondary | ICD-10-CM | POA: Diagnosis not present

## 2020-05-14 DIAGNOSIS — I10 Essential (primary) hypertension: Secondary | ICD-10-CM | POA: Diagnosis not present

## 2020-05-14 DIAGNOSIS — D649 Anemia, unspecified: Secondary | ICD-10-CM | POA: Diagnosis not present

## 2020-05-14 DIAGNOSIS — Z4822 Encounter for aftercare following kidney transplant: Secondary | ICD-10-CM | POA: Diagnosis not present

## 2020-05-14 DIAGNOSIS — Z940 Kidney transplant status: Secondary | ICD-10-CM | POA: Diagnosis not present

## 2020-05-21 DIAGNOSIS — R634 Abnormal weight loss: Secondary | ICD-10-CM | POA: Diagnosis not present

## 2020-05-21 DIAGNOSIS — E038 Other specified hypothyroidism: Secondary | ICD-10-CM | POA: Diagnosis not present

## 2020-05-21 DIAGNOSIS — N2889 Other specified disorders of kidney and ureter: Secondary | ICD-10-CM | POA: Diagnosis not present

## 2020-05-21 DIAGNOSIS — Z7952 Long term (current) use of systemic steroids: Secondary | ICD-10-CM | POA: Diagnosis not present

## 2020-05-21 DIAGNOSIS — Z4822 Encounter for aftercare following kidney transplant: Secondary | ICD-10-CM | POA: Diagnosis not present

## 2020-05-21 DIAGNOSIS — B259 Cytomegaloviral disease, unspecified: Secondary | ICD-10-CM | POA: Diagnosis not present

## 2020-05-21 DIAGNOSIS — I10 Essential (primary) hypertension: Secondary | ICD-10-CM | POA: Diagnosis not present

## 2020-05-21 DIAGNOSIS — Z79899 Other long term (current) drug therapy: Secondary | ICD-10-CM | POA: Diagnosis not present

## 2020-05-21 DIAGNOSIS — Z792 Long term (current) use of antibiotics: Secondary | ICD-10-CM | POA: Diagnosis not present

## 2020-05-21 DIAGNOSIS — N2581 Secondary hyperparathyroidism of renal origin: Secondary | ICD-10-CM | POA: Diagnosis not present

## 2020-05-21 DIAGNOSIS — Z940 Kidney transplant status: Secondary | ICD-10-CM | POA: Diagnosis not present

## 2020-05-21 DIAGNOSIS — I151 Hypertension secondary to other renal disorders: Secondary | ICD-10-CM | POA: Diagnosis not present

## 2020-05-21 DIAGNOSIS — Z5181 Encounter for therapeutic drug level monitoring: Secondary | ICD-10-CM | POA: Diagnosis not present

## 2020-06-04 DIAGNOSIS — I151 Hypertension secondary to other renal disorders: Secondary | ICD-10-CM | POA: Diagnosis not present

## 2020-06-04 DIAGNOSIS — Z4822 Encounter for aftercare following kidney transplant: Secondary | ICD-10-CM | POA: Diagnosis not present

## 2020-06-04 DIAGNOSIS — Z940 Kidney transplant status: Secondary | ICD-10-CM | POA: Diagnosis not present

## 2020-06-04 DIAGNOSIS — I120 Hypertensive chronic kidney disease with stage 5 chronic kidney disease or end stage renal disease: Secondary | ICD-10-CM | POA: Diagnosis not present

## 2020-06-04 DIAGNOSIS — B259 Cytomegaloviral disease, unspecified: Secondary | ICD-10-CM | POA: Diagnosis not present

## 2020-06-04 DIAGNOSIS — Z5181 Encounter for therapeutic drug level monitoring: Secondary | ICD-10-CM | POA: Diagnosis not present

## 2020-06-04 DIAGNOSIS — D84821 Immunodeficiency due to drugs: Secondary | ICD-10-CM | POA: Diagnosis not present

## 2020-06-04 DIAGNOSIS — N186 End stage renal disease: Secondary | ICD-10-CM | POA: Diagnosis not present

## 2020-06-04 DIAGNOSIS — E038 Other specified hypothyroidism: Secondary | ICD-10-CM | POA: Diagnosis not present

## 2020-06-04 DIAGNOSIS — N2889 Other specified disorders of kidney and ureter: Secondary | ICD-10-CM | POA: Diagnosis not present

## 2020-06-04 DIAGNOSIS — Z79899 Other long term (current) drug therapy: Secondary | ICD-10-CM | POA: Diagnosis not present

## 2020-06-04 DIAGNOSIS — R634 Abnormal weight loss: Secondary | ICD-10-CM | POA: Diagnosis not present

## 2020-06-11 ENCOUNTER — Ambulatory Visit (INDEPENDENT_AMBULATORY_CARE_PROVIDER_SITE_OTHER): Payer: Medicare Other | Admitting: Podiatry

## 2020-06-11 ENCOUNTER — Encounter: Payer: Self-pay | Admitting: Podiatry

## 2020-06-11 ENCOUNTER — Other Ambulatory Visit: Payer: Self-pay

## 2020-06-11 DIAGNOSIS — Z992 Dependence on renal dialysis: Secondary | ICD-10-CM

## 2020-06-11 DIAGNOSIS — N186 End stage renal disease: Secondary | ICD-10-CM | POA: Diagnosis not present

## 2020-06-11 DIAGNOSIS — L608 Other nail disorders: Secondary | ICD-10-CM | POA: Diagnosis not present

## 2020-06-11 DIAGNOSIS — B351 Tinea unguium: Secondary | ICD-10-CM

## 2020-06-11 DIAGNOSIS — M79674 Pain in right toe(s): Secondary | ICD-10-CM | POA: Diagnosis not present

## 2020-06-11 DIAGNOSIS — M79675 Pain in left toe(s): Secondary | ICD-10-CM

## 2020-06-11 NOTE — Progress Notes (Signed)
This patient returns to my office for at risk foot care.  This patient requires this care by a professional since this patient will be at risk due to having  ESRD and CVA and neuropathy.  This patient is unable to cut nails herself since the patient cannot reach her nails.These nails are painful walking and wearing shoes.  This patient presents for at risk foot care today.  General Appearance  Alert, conversant and in no acute stress.  Vascular  Dorsalis pedis and posterior tibial  pulses are palpable  bilaterally.  Capillary return is within normal limits  bilaterally. Temperature is within normal limits  bilaterally.  Neurologic  Senn-Weinstein monofilament wire test within normal limits  bilaterally. Muscle power within normal limits bilaterally.  Nails Thick disfigured discolored nails with subungual debris  from hallux to fifth toes bilaterally.   Pincer hallux nails  B/L.No evidence of bacterial infection or drainage bilaterally.  Orthopedic  No limitations of motion  feet .  No crepitus or effusions noted.  No bony pathology or digital deformities noted.  Skin  normotropic skin with no porokeratosis noted bilaterally.  No signs of infections or ulcers noted.     Onychomycosis  Pain in right toes  Pain in left toes  Consent was obtained for treatment procedures.   Mechanical debridement of nails 1-5  bilaterally performed with a nail nipper.  Filed with dremel without incident.    Return office visit  3 months                    Told patient to return for periodic foot care and evaluation due to potential at risk complications.   Gardiner Barefoot DPM

## 2020-06-18 DIAGNOSIS — Z940 Kidney transplant status: Secondary | ICD-10-CM | POA: Diagnosis not present

## 2020-06-18 DIAGNOSIS — D649 Anemia, unspecified: Secondary | ICD-10-CM | POA: Diagnosis not present

## 2020-06-18 DIAGNOSIS — Z905 Acquired absence of kidney: Secondary | ICD-10-CM | POA: Diagnosis not present

## 2020-06-18 DIAGNOSIS — I10 Essential (primary) hypertension: Secondary | ICD-10-CM | POA: Diagnosis not present

## 2020-06-18 DIAGNOSIS — B259 Cytomegaloviral disease, unspecified: Secondary | ICD-10-CM | POA: Diagnosis not present

## 2020-06-18 DIAGNOSIS — I69354 Hemiplegia and hemiparesis following cerebral infarction affecting left non-dominant side: Secondary | ICD-10-CM | POA: Diagnosis not present

## 2020-06-18 DIAGNOSIS — Z79899 Other long term (current) drug therapy: Secondary | ICD-10-CM | POA: Diagnosis not present

## 2020-06-18 DIAGNOSIS — Z23 Encounter for immunization: Secondary | ICD-10-CM | POA: Diagnosis not present

## 2020-06-18 DIAGNOSIS — D849 Immunodeficiency, unspecified: Secondary | ICD-10-CM | POA: Diagnosis not present

## 2020-06-18 DIAGNOSIS — Z4822 Encounter for aftercare following kidney transplant: Secondary | ICD-10-CM | POA: Diagnosis not present

## 2020-06-18 DIAGNOSIS — Z85528 Personal history of other malignant neoplasm of kidney: Secondary | ICD-10-CM | POA: Diagnosis not present

## 2020-06-18 DIAGNOSIS — R634 Abnormal weight loss: Secondary | ICD-10-CM | POA: Diagnosis not present

## 2020-06-18 DIAGNOSIS — Z7952 Long term (current) use of systemic steroids: Secondary | ICD-10-CM | POA: Diagnosis not present

## 2020-06-18 DIAGNOSIS — Z792 Long term (current) use of antibiotics: Secondary | ICD-10-CM | POA: Diagnosis not present

## 2020-07-02 DIAGNOSIS — D849 Immunodeficiency, unspecified: Secondary | ICD-10-CM | POA: Diagnosis not present

## 2020-07-02 DIAGNOSIS — Z940 Kidney transplant status: Secondary | ICD-10-CM | POA: Diagnosis not present

## 2020-07-16 DIAGNOSIS — E440 Moderate protein-calorie malnutrition: Secondary | ICD-10-CM | POA: Diagnosis not present

## 2020-07-16 DIAGNOSIS — I6930 Unspecified sequelae of cerebral infarction: Secondary | ICD-10-CM | POA: Diagnosis not present

## 2020-07-16 DIAGNOSIS — Z940 Kidney transplant status: Secondary | ICD-10-CM | POA: Diagnosis not present

## 2020-07-16 DIAGNOSIS — Z79899 Other long term (current) drug therapy: Secondary | ICD-10-CM | POA: Diagnosis not present

## 2020-07-16 DIAGNOSIS — I120 Hypertensive chronic kidney disease with stage 5 chronic kidney disease or end stage renal disease: Secondary | ICD-10-CM | POA: Diagnosis not present

## 2020-07-16 DIAGNOSIS — Z4822 Encounter for aftercare following kidney transplant: Secondary | ICD-10-CM | POA: Diagnosis not present

## 2020-07-16 DIAGNOSIS — D849 Immunodeficiency, unspecified: Secondary | ICD-10-CM | POA: Diagnosis not present

## 2020-07-16 DIAGNOSIS — I10 Essential (primary) hypertension: Secondary | ICD-10-CM | POA: Diagnosis not present

## 2020-07-16 DIAGNOSIS — N186 End stage renal disease: Secondary | ICD-10-CM | POA: Diagnosis not present

## 2020-07-16 DIAGNOSIS — I739 Peripheral vascular disease, unspecified: Secondary | ICD-10-CM | POA: Diagnosis not present

## 2020-07-16 DIAGNOSIS — B259 Cytomegaloviral disease, unspecified: Secondary | ICD-10-CM | POA: Diagnosis not present

## 2020-09-24 ENCOUNTER — Ambulatory Visit (INDEPENDENT_AMBULATORY_CARE_PROVIDER_SITE_OTHER): Payer: Medicare Other | Admitting: Podiatry

## 2020-09-24 ENCOUNTER — Encounter: Payer: Self-pay | Admitting: Podiatry

## 2020-09-24 ENCOUNTER — Other Ambulatory Visit: Payer: Self-pay

## 2020-09-24 DIAGNOSIS — Z992 Dependence on renal dialysis: Secondary | ICD-10-CM

## 2020-09-24 DIAGNOSIS — G603 Idiopathic progressive neuropathy: Secondary | ICD-10-CM

## 2020-09-24 DIAGNOSIS — L608 Other nail disorders: Secondary | ICD-10-CM | POA: Diagnosis not present

## 2020-09-24 DIAGNOSIS — B351 Tinea unguium: Secondary | ICD-10-CM

## 2020-09-24 DIAGNOSIS — M79675 Pain in left toe(s): Secondary | ICD-10-CM

## 2020-09-24 DIAGNOSIS — N186 End stage renal disease: Secondary | ICD-10-CM

## 2020-09-24 DIAGNOSIS — M79674 Pain in right toe(s): Secondary | ICD-10-CM | POA: Diagnosis not present

## 2020-09-24 NOTE — Progress Notes (Signed)
This patient returns to my office for at risk foot care.  This patient requires this care by a professional since this patient will be at risk due to having  ESRD and CVA and neuropathy.  This patient is unable to cut nails herself since the patient cannot reach her nails.These nails are painful walking and wearing shoes.  This patient presents for at risk foot care today.  General Appearance  Alert, conversant and in no acute stress.  Vascular  Dorsalis pedis and posterior tibial  pulses are palpable  bilaterally.  Capillary return is within normal limits  bilaterally. Temperature is within normal limits  bilaterally.  Neurologic  Senn-Weinstein monofilament wire test within normal limits  bilaterally. Muscle power within normal limits bilaterally.  Nails Thick disfigured discolored nails with subungual debris  from hallux to fifth toes bilaterally.   Pincer hallux nails  B/L.No evidence of bacterial infection or drainage bilaterally.  Orthopedic  No limitations of motion  feet .  No crepitus or effusions noted.  No bony pathology or digital deformities noted.  Skin  normotropic skin with no porokeratosis noted bilaterally.  No signs of infections or ulcers noted.     Onychomycosis  Pain in right toes  Pain in left toes  Consent was obtained for treatment procedures.   Mechanical debridement of nails 1-5  bilaterally performed with a nail nipper.  Filed with dremel without incident.    Return office visit  3 months                    Told patient to return for periodic foot care and evaluation due to potential at risk complications.   Gardiner Barefoot DPM

## 2020-09-26 DIAGNOSIS — I10 Essential (primary) hypertension: Secondary | ICD-10-CM | POA: Diagnosis not present

## 2020-09-26 DIAGNOSIS — Z792 Long term (current) use of antibiotics: Secondary | ICD-10-CM | POA: Diagnosis not present

## 2020-09-26 DIAGNOSIS — Z79899 Other long term (current) drug therapy: Secondary | ICD-10-CM | POA: Diagnosis not present

## 2020-09-26 DIAGNOSIS — I6930 Unspecified sequelae of cerebral infarction: Secondary | ICD-10-CM | POA: Diagnosis not present

## 2020-09-26 DIAGNOSIS — B259 Cytomegaloviral disease, unspecified: Secondary | ICD-10-CM | POA: Diagnosis not present

## 2020-09-26 DIAGNOSIS — D649 Anemia, unspecified: Secondary | ICD-10-CM | POA: Diagnosis not present

## 2020-09-26 DIAGNOSIS — Z940 Kidney transplant status: Secondary | ICD-10-CM | POA: Diagnosis not present

## 2020-09-26 DIAGNOSIS — Z7952 Long term (current) use of systemic steroids: Secondary | ICD-10-CM | POA: Diagnosis not present

## 2020-09-26 DIAGNOSIS — G8114 Spastic hemiplegia affecting left nondominant side: Secondary | ICD-10-CM | POA: Diagnosis not present

## 2020-09-26 DIAGNOSIS — Z4822 Encounter for aftercare following kidney transplant: Secondary | ICD-10-CM | POA: Diagnosis not present

## 2020-09-26 DIAGNOSIS — I69354 Hemiplegia and hemiparesis following cerebral infarction affecting left non-dominant side: Secondary | ICD-10-CM | POA: Diagnosis not present

## 2020-09-26 DIAGNOSIS — Z79890 Hormone replacement therapy (postmenopausal): Secondary | ICD-10-CM | POA: Diagnosis not present

## 2020-09-26 DIAGNOSIS — D849 Immunodeficiency, unspecified: Secondary | ICD-10-CM | POA: Diagnosis not present

## 2020-11-01 DIAGNOSIS — Z6821 Body mass index (BMI) 21.0-21.9, adult: Secondary | ICD-10-CM | POA: Diagnosis not present

## 2020-11-01 DIAGNOSIS — R634 Abnormal weight loss: Secondary | ICD-10-CM | POA: Diagnosis not present

## 2020-11-01 DIAGNOSIS — Z79899 Other long term (current) drug therapy: Secondary | ICD-10-CM | POA: Diagnosis not present

## 2020-11-01 DIAGNOSIS — I151 Hypertension secondary to other renal disorders: Secondary | ICD-10-CM | POA: Diagnosis not present

## 2020-11-01 DIAGNOSIS — I6930 Unspecified sequelae of cerebral infarction: Secondary | ICD-10-CM | POA: Diagnosis not present

## 2020-11-01 DIAGNOSIS — D649 Anemia, unspecified: Secondary | ICD-10-CM | POA: Diagnosis not present

## 2020-11-01 DIAGNOSIS — E039 Hypothyroidism, unspecified: Secondary | ICD-10-CM | POA: Diagnosis not present

## 2020-11-01 DIAGNOSIS — B259 Cytomegaloviral disease, unspecified: Secondary | ICD-10-CM | POA: Diagnosis not present

## 2020-11-01 DIAGNOSIS — Z4822 Encounter for aftercare following kidney transplant: Secondary | ICD-10-CM | POA: Diagnosis not present

## 2020-11-01 DIAGNOSIS — Z5181 Encounter for therapeutic drug level monitoring: Secondary | ICD-10-CM | POA: Diagnosis not present

## 2020-11-01 DIAGNOSIS — Z792 Long term (current) use of antibiotics: Secondary | ICD-10-CM | POA: Diagnosis not present

## 2020-11-01 DIAGNOSIS — Z940 Kidney transplant status: Secondary | ICD-10-CM | POA: Diagnosis not present

## 2020-11-01 DIAGNOSIS — I69354 Hemiplegia and hemiparesis following cerebral infarction affecting left non-dominant side: Secondary | ICD-10-CM | POA: Diagnosis not present

## 2020-11-01 DIAGNOSIS — N2889 Other specified disorders of kidney and ureter: Secondary | ICD-10-CM | POA: Diagnosis not present

## 2020-11-01 DIAGNOSIS — Z905 Acquired absence of kidney: Secondary | ICD-10-CM | POA: Diagnosis not present

## 2020-11-01 DIAGNOSIS — R809 Proteinuria, unspecified: Secondary | ICD-10-CM | POA: Diagnosis not present

## 2020-11-01 DIAGNOSIS — I10 Essential (primary) hypertension: Secondary | ICD-10-CM | POA: Diagnosis not present

## 2020-11-01 DIAGNOSIS — Z79890 Hormone replacement therapy (postmenopausal): Secondary | ICD-10-CM | POA: Diagnosis not present

## 2020-12-03 DIAGNOSIS — I69354 Hemiplegia and hemiparesis following cerebral infarction affecting left non-dominant side: Secondary | ICD-10-CM | POA: Diagnosis not present

## 2020-12-03 DIAGNOSIS — Z905 Acquired absence of kidney: Secondary | ICD-10-CM | POA: Diagnosis not present

## 2020-12-03 DIAGNOSIS — Z85528 Personal history of other malignant neoplasm of kidney: Secondary | ICD-10-CM | POA: Diagnosis not present

## 2020-12-03 DIAGNOSIS — I7772 Dissection of iliac artery: Secondary | ICD-10-CM | POA: Diagnosis not present

## 2020-12-03 DIAGNOSIS — D649 Anemia, unspecified: Secondary | ICD-10-CM | POA: Diagnosis not present

## 2020-12-03 DIAGNOSIS — Z7952 Long term (current) use of systemic steroids: Secondary | ICD-10-CM | POA: Diagnosis not present

## 2020-12-03 DIAGNOSIS — N2889 Other specified disorders of kidney and ureter: Secondary | ICD-10-CM | POA: Diagnosis not present

## 2020-12-03 DIAGNOSIS — B259 Cytomegaloviral disease, unspecified: Secondary | ICD-10-CM | POA: Diagnosis not present

## 2020-12-03 DIAGNOSIS — Z5181 Encounter for therapeutic drug level monitoring: Secondary | ICD-10-CM | POA: Diagnosis not present

## 2020-12-03 DIAGNOSIS — D849 Immunodeficiency, unspecified: Secondary | ICD-10-CM | POA: Diagnosis not present

## 2020-12-03 DIAGNOSIS — Z79899 Other long term (current) drug therapy: Secondary | ICD-10-CM | POA: Diagnosis not present

## 2020-12-03 DIAGNOSIS — I6930 Unspecified sequelae of cerebral infarction: Secondary | ICD-10-CM | POA: Diagnosis not present

## 2020-12-03 DIAGNOSIS — Z940 Kidney transplant status: Secondary | ICD-10-CM | POA: Diagnosis not present

## 2020-12-03 DIAGNOSIS — Z792 Long term (current) use of antibiotics: Secondary | ICD-10-CM | POA: Diagnosis not present

## 2020-12-03 DIAGNOSIS — R809 Proteinuria, unspecified: Secondary | ICD-10-CM | POA: Diagnosis not present

## 2020-12-03 DIAGNOSIS — Z9889 Other specified postprocedural states: Secondary | ICD-10-CM | POA: Diagnosis not present

## 2020-12-03 DIAGNOSIS — I151 Hypertension secondary to other renal disorders: Secondary | ICD-10-CM | POA: Diagnosis not present

## 2020-12-03 DIAGNOSIS — Z4822 Encounter for aftercare following kidney transplant: Secondary | ICD-10-CM | POA: Diagnosis not present

## 2020-12-03 DIAGNOSIS — I10 Essential (primary) hypertension: Secondary | ICD-10-CM | POA: Diagnosis not present

## 2020-12-24 ENCOUNTER — Ambulatory Visit: Payer: Medicare Other | Admitting: Podiatry

## 2020-12-30 ENCOUNTER — Encounter: Payer: Self-pay | Admitting: Podiatry

## 2020-12-30 ENCOUNTER — Other Ambulatory Visit: Payer: Self-pay

## 2020-12-30 ENCOUNTER — Ambulatory Visit (INDEPENDENT_AMBULATORY_CARE_PROVIDER_SITE_OTHER): Payer: Medicare Other | Admitting: Podiatry

## 2020-12-30 DIAGNOSIS — Z992 Dependence on renal dialysis: Secondary | ICD-10-CM

## 2020-12-30 DIAGNOSIS — B351 Tinea unguium: Secondary | ICD-10-CM

## 2020-12-30 DIAGNOSIS — M79675 Pain in left toe(s): Secondary | ICD-10-CM

## 2020-12-30 DIAGNOSIS — N186 End stage renal disease: Secondary | ICD-10-CM | POA: Diagnosis not present

## 2020-12-30 DIAGNOSIS — L608 Other nail disorders: Secondary | ICD-10-CM

## 2020-12-30 DIAGNOSIS — M79674 Pain in right toe(s): Secondary | ICD-10-CM

## 2020-12-30 NOTE — Progress Notes (Signed)
This patient returns to my office for at risk foot care.  This patient requires this care by a professional since this patient will be at risk due to having  ESRD and CVA and neuropathy.  This patient is unable to cut nails herself since the patient cannot reach her nails.These nails are painful walking and wearing shoes.  This patient presents for at risk foot care today.  General Appearance  Alert, conversant and in no acute stress.  Vascular  Dorsalis pedis and posterior tibial  pulses are palpable  bilaterally.  Capillary return is within normal limits  bilaterally. Temperature is within normal limits  bilaterally.  Neurologic  Senn-Weinstein monofilament wire test within normal limits  bilaterally. Muscle power within normal limits bilaterally.  Nails Thick disfigured discolored nails with subungual debris  from hallux to fifth toes bilaterally.   Pincer hallux nails  B/L.No evidence of bacterial infection or drainage bilaterally.  Orthopedic  No limitations of motion  feet .  No crepitus or effusions noted.  No bony pathology or digital deformities noted.  Skin  normotropic skin with no porokeratosis noted bilaterally.  No signs of infections or ulcers noted.     Onychomycosis  Pain in right toes  Pain in left toes  Consent was obtained for treatment procedures.   Mechanical debridement of nails 1-5  bilaterally performed with a nail nipper.  Filed with dremel without incident.    Return office visit  4 months                    Told patient to return for periodic foot care and evaluation due to potential at risk complications.   Gardiner Barefoot DPM

## 2021-02-28 DIAGNOSIS — D84821 Immunodeficiency due to drugs: Secondary | ICD-10-CM | POA: Diagnosis not present

## 2021-02-28 DIAGNOSIS — Z940 Kidney transplant status: Secondary | ICD-10-CM | POA: Diagnosis not present

## 2021-02-28 DIAGNOSIS — Z4822 Encounter for aftercare following kidney transplant: Secondary | ICD-10-CM | POA: Diagnosis not present

## 2021-02-28 DIAGNOSIS — Z79899 Other long term (current) drug therapy: Secondary | ICD-10-CM | POA: Diagnosis not present

## 2021-02-28 DIAGNOSIS — N390 Urinary tract infection, site not specified: Secondary | ICD-10-CM | POA: Diagnosis not present

## 2021-02-28 DIAGNOSIS — I120 Hypertensive chronic kidney disease with stage 5 chronic kidney disease or end stage renal disease: Secondary | ICD-10-CM | POA: Diagnosis not present

## 2021-02-28 DIAGNOSIS — N186 End stage renal disease: Secondary | ICD-10-CM | POA: Diagnosis not present

## 2021-02-28 DIAGNOSIS — B961 Klebsiella pneumoniae [K. pneumoniae] as the cause of diseases classified elsewhere: Secondary | ICD-10-CM | POA: Diagnosis not present

## 2021-02-28 DIAGNOSIS — D631 Anemia in chronic kidney disease: Secondary | ICD-10-CM | POA: Diagnosis not present

## 2021-03-07 DIAGNOSIS — R7989 Other specified abnormal findings of blood chemistry: Secondary | ICD-10-CM | POA: Diagnosis not present

## 2021-03-18 DIAGNOSIS — Z940 Kidney transplant status: Secondary | ICD-10-CM | POA: Diagnosis not present

## 2021-03-18 DIAGNOSIS — N269 Renal sclerosis, unspecified: Secondary | ICD-10-CM | POA: Diagnosis not present

## 2021-03-18 DIAGNOSIS — Z79899 Other long term (current) drug therapy: Secondary | ICD-10-CM | POA: Diagnosis not present

## 2021-03-18 DIAGNOSIS — R7989 Other specified abnormal findings of blood chemistry: Secondary | ICD-10-CM | POA: Diagnosis not present

## 2021-03-18 DIAGNOSIS — D849 Immunodeficiency, unspecified: Secondary | ICD-10-CM | POA: Diagnosis not present

## 2021-03-24 DIAGNOSIS — I639 Cerebral infarction, unspecified: Secondary | ICD-10-CM | POA: Diagnosis not present

## 2021-03-24 DIAGNOSIS — R768 Other specified abnormal immunological findings in serum: Secondary | ICD-10-CM | POA: Diagnosis not present

## 2021-03-24 DIAGNOSIS — I10 Essential (primary) hypertension: Secondary | ICD-10-CM | POA: Diagnosis not present

## 2021-03-24 DIAGNOSIS — D649 Anemia, unspecified: Secondary | ICD-10-CM | POA: Diagnosis not present

## 2021-03-24 DIAGNOSIS — R809 Proteinuria, unspecified: Secondary | ICD-10-CM | POA: Diagnosis not present

## 2021-03-24 DIAGNOSIS — Z792 Long term (current) use of antibiotics: Secondary | ICD-10-CM | POA: Diagnosis not present

## 2021-03-24 DIAGNOSIS — B259 Cytomegaloviral disease, unspecified: Secondary | ICD-10-CM | POA: Diagnosis not present

## 2021-03-24 DIAGNOSIS — R634 Abnormal weight loss: Secondary | ICD-10-CM | POA: Diagnosis not present

## 2021-03-24 DIAGNOSIS — Z79899 Other long term (current) drug therapy: Secondary | ICD-10-CM | POA: Diagnosis not present

## 2021-03-24 DIAGNOSIS — Z4822 Encounter for aftercare following kidney transplant: Secondary | ICD-10-CM | POA: Diagnosis not present

## 2021-03-24 DIAGNOSIS — Z940 Kidney transplant status: Secondary | ICD-10-CM | POA: Diagnosis not present

## 2021-03-24 DIAGNOSIS — Z7952 Long term (current) use of systemic steroids: Secondary | ICD-10-CM | POA: Diagnosis not present

## 2021-03-24 DIAGNOSIS — Z905 Acquired absence of kidney: Secondary | ICD-10-CM | POA: Diagnosis not present

## 2021-03-24 DIAGNOSIS — D849 Immunodeficiency, unspecified: Secondary | ICD-10-CM | POA: Diagnosis not present

## 2021-03-24 DIAGNOSIS — Z23 Encounter for immunization: Secondary | ICD-10-CM | POA: Diagnosis not present

## 2021-04-21 DIAGNOSIS — Z79899 Other long term (current) drug therapy: Secondary | ICD-10-CM | POA: Diagnosis not present

## 2021-04-21 DIAGNOSIS — Z85528 Personal history of other malignant neoplasm of kidney: Secondary | ICD-10-CM | POA: Diagnosis not present

## 2021-04-21 DIAGNOSIS — B258 Other cytomegaloviral diseases: Secondary | ICD-10-CM | POA: Diagnosis not present

## 2021-04-21 DIAGNOSIS — Z792 Long term (current) use of antibiotics: Secondary | ICD-10-CM | POA: Diagnosis not present

## 2021-04-21 DIAGNOSIS — Z87898 Personal history of other specified conditions: Secondary | ICD-10-CM | POA: Diagnosis not present

## 2021-04-21 DIAGNOSIS — Z7952 Long term (current) use of systemic steroids: Secondary | ICD-10-CM | POA: Diagnosis not present

## 2021-04-21 DIAGNOSIS — Z940 Kidney transplant status: Secondary | ICD-10-CM | POA: Diagnosis not present

## 2021-04-21 DIAGNOSIS — I10 Essential (primary) hypertension: Secondary | ICD-10-CM | POA: Diagnosis not present

## 2021-04-21 DIAGNOSIS — I69354 Hemiplegia and hemiparesis following cerebral infarction affecting left non-dominant side: Secondary | ICD-10-CM | POA: Diagnosis not present

## 2021-04-21 DIAGNOSIS — D649 Anemia, unspecified: Secondary | ICD-10-CM | POA: Diagnosis not present

## 2021-04-21 DIAGNOSIS — B259 Cytomegaloviral disease, unspecified: Secondary | ICD-10-CM | POA: Diagnosis not present

## 2021-04-21 DIAGNOSIS — Z8673 Personal history of transient ischemic attack (TIA), and cerebral infarction without residual deficits: Secondary | ICD-10-CM | POA: Diagnosis not present

## 2021-04-21 DIAGNOSIS — Z23 Encounter for immunization: Secondary | ICD-10-CM | POA: Diagnosis not present

## 2021-04-21 DIAGNOSIS — R634 Abnormal weight loss: Secondary | ICD-10-CM | POA: Diagnosis not present

## 2021-04-21 DIAGNOSIS — D8989 Other specified disorders involving the immune mechanism, not elsewhere classified: Secondary | ICD-10-CM | POA: Diagnosis not present

## 2021-04-21 DIAGNOSIS — Z4822 Encounter for aftercare following kidney transplant: Secondary | ICD-10-CM | POA: Diagnosis not present

## 2021-04-21 DIAGNOSIS — R809 Proteinuria, unspecified: Secondary | ICD-10-CM | POA: Diagnosis not present

## 2021-04-21 DIAGNOSIS — Z87891 Personal history of nicotine dependence: Secondary | ICD-10-CM | POA: Diagnosis not present

## 2021-04-21 DIAGNOSIS — R808 Other proteinuria: Secondary | ICD-10-CM | POA: Diagnosis not present

## 2021-04-21 DIAGNOSIS — Z905 Acquired absence of kidney: Secondary | ICD-10-CM | POA: Diagnosis not present

## 2021-04-21 DIAGNOSIS — I70201 Unspecified atherosclerosis of native arteries of extremities, right leg: Secondary | ICD-10-CM | POA: Diagnosis not present

## 2021-05-05 ENCOUNTER — Encounter: Payer: Self-pay | Admitting: Podiatry

## 2021-05-05 ENCOUNTER — Other Ambulatory Visit: Payer: Self-pay

## 2021-05-05 ENCOUNTER — Ambulatory Visit (INDEPENDENT_AMBULATORY_CARE_PROVIDER_SITE_OTHER): Payer: Medicare Other | Admitting: Podiatry

## 2021-05-05 DIAGNOSIS — N186 End stage renal disease: Secondary | ICD-10-CM

## 2021-05-05 DIAGNOSIS — M79674 Pain in right toe(s): Secondary | ICD-10-CM

## 2021-05-05 DIAGNOSIS — M79675 Pain in left toe(s): Secondary | ICD-10-CM | POA: Diagnosis not present

## 2021-05-05 DIAGNOSIS — L608 Other nail disorders: Secondary | ICD-10-CM

## 2021-05-05 DIAGNOSIS — B351 Tinea unguium: Secondary | ICD-10-CM

## 2021-05-05 DIAGNOSIS — Z992 Dependence on renal dialysis: Secondary | ICD-10-CM

## 2021-05-05 NOTE — Progress Notes (Signed)
This patient returns to my office for at risk foot care.  This patient requires this care by a professional since this patient will be at risk due to having  ESRD and CVA and neuropathy.  This patient is unable to cut nails herself since the patient cannot reach her nails.These nails are painful walking and wearing shoes.  This patient presents for at risk foot care today.  General Appearance  Alert, conversant and in no acute stress.  Vascular  Dorsalis pedis and posterior tibial  pulses are palpable  bilaterally.  Capillary return is within normal limits  bilaterally. Temperature is within normal limits  bilaterally.  Neurologic  Senn-Weinstein monofilament wire test within normal limits  bilaterally. Muscle power within normal limits bilaterally.  Nails Thick disfigured discolored nails with subungual debris  from hallux to fifth toes bilaterally.   Pincer hallux nails  B/L.No evidence of bacterial infection or drainage bilaterally.  Orthopedic  No limitations of motion  feet .  No crepitus or effusions noted.  No bony pathology or digital deformities noted.  Skin  normotropic skin with no porokeratosis noted bilaterally.  No signs of infections or ulcers noted.     Onychomycosis  Pain in right toes  Pain in left toes  Consent was obtained for treatment procedures.   Mechanical debridement of nails 1-5  bilaterally performed with a nail nipper.  Filed with dremel without incident.    Return office visit  4 months                    Told patient to return for periodic foot care and evaluation due to potential at risk complications.   Gardiner Barefoot DPM

## 2021-05-19 DIAGNOSIS — Z940 Kidney transplant status: Secondary | ICD-10-CM | POA: Diagnosis not present

## 2021-05-19 DIAGNOSIS — D72829 Elevated white blood cell count, unspecified: Secondary | ICD-10-CM | POA: Diagnosis not present

## 2021-05-19 DIAGNOSIS — I10 Essential (primary) hypertension: Secondary | ICD-10-CM | POA: Diagnosis not present

## 2021-05-19 DIAGNOSIS — D649 Anemia, unspecified: Secondary | ICD-10-CM | POA: Diagnosis not present

## 2021-05-19 DIAGNOSIS — Z905 Acquired absence of kidney: Secondary | ICD-10-CM | POA: Diagnosis not present

## 2021-05-19 DIAGNOSIS — E890 Postprocedural hypothyroidism: Secondary | ICD-10-CM | POA: Diagnosis not present

## 2021-05-19 DIAGNOSIS — D849 Immunodeficiency, unspecified: Secondary | ICD-10-CM | POA: Diagnosis not present

## 2021-05-19 DIAGNOSIS — Z792 Long term (current) use of antibiotics: Secondary | ICD-10-CM | POA: Diagnosis not present

## 2021-05-19 DIAGNOSIS — Z8619 Personal history of other infectious and parasitic diseases: Secondary | ICD-10-CM | POA: Diagnosis not present

## 2021-05-19 DIAGNOSIS — Z7952 Long term (current) use of systemic steroids: Secondary | ICD-10-CM | POA: Diagnosis not present

## 2021-05-19 DIAGNOSIS — Z85528 Personal history of other malignant neoplasm of kidney: Secondary | ICD-10-CM | POA: Diagnosis not present

## 2021-05-19 DIAGNOSIS — I6930 Unspecified sequelae of cerebral infarction: Secondary | ICD-10-CM | POA: Diagnosis not present

## 2021-05-19 DIAGNOSIS — Z4822 Encounter for aftercare following kidney transplant: Secondary | ICD-10-CM | POA: Diagnosis not present

## 2021-05-19 DIAGNOSIS — G8194 Hemiplegia, unspecified affecting left nondominant side: Secondary | ICD-10-CM | POA: Diagnosis not present

## 2021-05-19 DIAGNOSIS — Z79899 Other long term (current) drug therapy: Secondary | ICD-10-CM | POA: Diagnosis not present

## 2021-05-19 DIAGNOSIS — Z79890 Hormone replacement therapy (postmenopausal): Secondary | ICD-10-CM | POA: Diagnosis not present

## 2021-05-19 DIAGNOSIS — B259 Cytomegaloviral disease, unspecified: Secondary | ICD-10-CM | POA: Diagnosis not present

## 2021-05-19 DIAGNOSIS — Z8673 Personal history of transient ischemic attack (TIA), and cerebral infarction without residual deficits: Secondary | ICD-10-CM | POA: Diagnosis not present

## 2021-06-12 DIAGNOSIS — Z01411 Encounter for gynecological examination (general) (routine) with abnormal findings: Secondary | ICD-10-CM | POA: Diagnosis not present

## 2021-06-12 DIAGNOSIS — Z6822 Body mass index (BMI) 22.0-22.9, adult: Secondary | ICD-10-CM | POA: Diagnosis not present

## 2021-06-12 DIAGNOSIS — Z1231 Encounter for screening mammogram for malignant neoplasm of breast: Secondary | ICD-10-CM | POA: Diagnosis not present

## 2021-06-12 DIAGNOSIS — Z113 Encounter for screening for infections with a predominantly sexual mode of transmission: Secondary | ICD-10-CM | POA: Diagnosis not present

## 2021-06-12 DIAGNOSIS — Z01419 Encounter for gynecological examination (general) (routine) without abnormal findings: Secondary | ICD-10-CM | POA: Diagnosis not present

## 2021-06-12 DIAGNOSIS — Z124 Encounter for screening for malignant neoplasm of cervix: Secondary | ICD-10-CM | POA: Diagnosis not present

## 2021-09-02 ENCOUNTER — Other Ambulatory Visit: Payer: Self-pay

## 2021-09-02 ENCOUNTER — Ambulatory Visit (INDEPENDENT_AMBULATORY_CARE_PROVIDER_SITE_OTHER): Payer: Medicare Other | Admitting: Podiatrist

## 2021-09-02 ENCOUNTER — Encounter: Payer: Self-pay | Admitting: Podiatrist

## 2021-09-02 DIAGNOSIS — M79675 Pain in left toe(s): Secondary | ICD-10-CM | POA: Diagnosis not present

## 2021-09-02 DIAGNOSIS — B351 Tinea unguium: Secondary | ICD-10-CM | POA: Diagnosis not present

## 2021-09-02 DIAGNOSIS — M79674 Pain in right toe(s): Secondary | ICD-10-CM | POA: Diagnosis not present

## 2021-09-02 NOTE — Progress Notes (Signed)
This patient returns to my office for at risk foot care.  This patient requires this care by a professional since this patient will be at risk due to having  neuropathy.  This patient is unable to cut nails herself since the patient cannot reach her nails.These nails are painful walking and wearing shoes.  This patient presents for at risk foot care today.   General Appearance  Alert, conversant and in no acute stress.   Vascular  Dorsalis pedis and posterior tibial  pulses are palpable  bilaterally.  Capillary return is within normal limits  bilaterally. Temperature is within normal limits  bilaterally.   Neurologic  Senn-Weinstein monofilament wire test within normal limits  bilaterally. Muscle power within normal limits bilaterally.   Nails Thick disfigured discolored nails with subungual debris  from hallux to fifth toes bilaterally.   Pincer hallux nails  B/L.No evidence of bacterial infection or drainage bilaterally.   Orthopedic  No limitations of motion  feet .  No crepitus or effusions noted.  No bony pathology or digital deformities noted.   Skin  normotropic skin with no porokeratosis noted bilaterally.  No signs of infections or ulcers noted.      Onychomycosis  Pain in right toes  Pain in left toes   Consent was obtained for treatment procedures.   Mechanical debridement of nails 1-5  bilaterally performed with a nail nipper.  Filed with dremel without incident.      Return office visit  4 months                    Told patient to return for periodic foot care and evaluation due to potential at risk complications.

## 2021-12-02 ENCOUNTER — Encounter (HOSPITAL_COMMUNITY): Payer: Self-pay

## 2021-12-02 ENCOUNTER — Emergency Department (HOSPITAL_COMMUNITY)
Admission: EM | Admit: 2021-12-02 | Discharge: 2021-12-02 | Disposition: A | Discharge status: Home / Self Care | Payer: Medicare Other | Attending: Emergency Medicine | Admitting: Emergency Medicine

## 2021-12-02 ENCOUNTER — Other Ambulatory Visit: Payer: Self-pay

## 2021-12-02 DIAGNOSIS — Z79899 Other long term (current) drug therapy: Secondary | ICD-10-CM | POA: Insufficient documentation

## 2021-12-02 DIAGNOSIS — R7989 Other specified abnormal findings of blood chemistry: Secondary | ICD-10-CM

## 2021-12-02 DIAGNOSIS — K921 Melena: Secondary | ICD-10-CM

## 2021-12-02 DIAGNOSIS — D649 Anemia, unspecified: Secondary | ICD-10-CM | POA: Diagnosis not present

## 2021-12-02 DIAGNOSIS — D72829 Elevated white blood cell count, unspecified: Secondary | ICD-10-CM | POA: Diagnosis not present

## 2021-12-02 DIAGNOSIS — R0602 Shortness of breath: Secondary | ICD-10-CM | POA: Diagnosis present

## 2021-12-02 DIAGNOSIS — Z7982 Long term (current) use of aspirin: Secondary | ICD-10-CM | POA: Insufficient documentation

## 2021-12-02 DIAGNOSIS — I10 Essential (primary) hypertension: Secondary | ICD-10-CM | POA: Insufficient documentation

## 2021-12-02 LAB — CBC
HCT: 30.5 % — ABNORMAL LOW (ref 36.0–46.0)
Hemoglobin: 9.3 g/dL — ABNORMAL LOW (ref 12.0–15.0)
MCH: 31.1 pg (ref 26.0–34.0)
MCHC: 30.5 g/dL (ref 30.0–36.0)
MCV: 102 fL — ABNORMAL HIGH (ref 80.0–100.0)
Platelets: 230 10*3/uL (ref 150–400)
RBC: 2.99 MIL/uL — ABNORMAL LOW (ref 3.87–5.11)
RDW: 13.3 % (ref 11.5–15.5)
WBC: 11.7 10*3/uL — ABNORMAL HIGH (ref 4.0–10.5)
nRBC: 0 % (ref 0.0–0.2)

## 2021-12-02 LAB — TYPE AND SCREEN
ABO/RH(D): O POS
Antibody Screen: NEGATIVE

## 2021-12-02 LAB — COMPREHENSIVE METABOLIC PANEL
ALT: 8 U/L (ref 0–44)
AST: 17 U/L (ref 15–41)
Albumin: 4 g/dL (ref 3.5–5.0)
Alkaline Phosphatase: 65 U/L (ref 38–126)
Anion gap: 9 (ref 5–15)
BUN: 36 mg/dL — ABNORMAL HIGH (ref 6–20)
CO2: 22 mmol/L (ref 22–32)
Calcium: 9.1 mg/dL (ref 8.9–10.3)
Chloride: 108 mmol/L (ref 98–111)
Creatinine, Ser: 2.29 mg/dL — ABNORMAL HIGH (ref 0.44–1.00)
GFR, Estimated: 24 mL/min — ABNORMAL LOW (ref >60)
Glucose, Bld: 112 mg/dL — ABNORMAL HIGH (ref 70–99)
Potassium: 4.7 mmol/L (ref 3.5–5.1)
Sodium: 139 mmol/L (ref 135–145)
Total Bilirubin: 0.9 mg/dL (ref 0.3–1.2)
Total Protein: 7.1 g/dL (ref 6.5–8.1)

## 2021-12-02 LAB — PROTIME-INR
INR: 1.1 (ref 0.8–1.2)
Prothrombin Time: 14 seconds (ref 11.4–15.2)

## 2021-12-02 LAB — RETICULOCYTES
Immature Retic Fract: 16.3 % — ABNORMAL HIGH (ref 2.3–15.9)
RBC.: 3.03 MIL/uL — ABNORMAL LOW (ref 3.87–5.11)
Retic Count, Absolute: 114.8 10*3/uL (ref 19.0–186.0)
Retic Ct Pct: 3.8 % — ABNORMAL HIGH (ref 0.4–3.1)

## 2021-12-02 NOTE — ED Provider Notes (Signed)
[facility] DEPT Provider Note   CSN: [id number] Arrival date & time: 12/02/21  1750     History  Chief Complaint  Patient presents with   Abnormal Lab   Melena    Terry Juarez is a 56 y.o. female.  Patient presents to the hospital at the urging of her primary care provider due to an abnormal hemoglobin value.  Patient was seen by her primary care provider earlier today due to black stools, weakness, shortness of breath that been ongoing for approximately 3 days.  Patient was told that her hemoglobin was approximately 8 and that she may need a transfusion.  She was urged to go to the emergency department.  The patient states that she has been feeling tired, short of breath, somewhat weak over the past 3 days.  She states that her stools have been black throughout this timeframe.  She denies any nausea, abdominal pain, constipation, diarrhea.  Denies chest pain.  Endorses shortness of breath, weakness, black stools.  Past medical history significant for renal transplant, hypertension, history of anemia, history of stroke, history of seizures, history of GI bleed, history of GERD  HPI     Home Medications Prior to Admission medications   Medication Sig Start Date End Date Taking? Authorizing Provider  amLODipine (NORVASC) 10 MG tablet Take 1 tablet (10 mg total) by mouth daily. Patient taking differently: Take 10 mg by mouth daily with supper.  02/01/17   Bary Leriche, PA-C  aspirin EC 81 MG tablet Take 1 tablet (81 mg total) by mouth daily. 04/08/17   Rosalin Hawking, MD  atorvastatin (LIPITOR) 40 MG tablet Take by mouth.    [provider]  B Complex-C-Folic Acid (RENA-VITE PO) RENA-VITE 08/16/18   [provider]  calcitRIOL (ROCALTROL) 0.25 MCG capsule Take 0.5 mcg by mouth daily. 06/21/19   [provider]  calcium acetate (PHOSLO) 667 MG capsule Take 1,334 mg by mouth 3 (three) times daily with meals.    [provider]  carvedilol (COREG) 12.5 MG tablet Take 25 mg by mouth 2 (two) times daily. 04/22/18   [provider]  cinacalcet (SENSIPAR) 60 MG tablet Take by mouth. 02/16/15   [provider]  cloNIDine (CATAPRES) 0.1 MG tablet Take 0.1 mg by mouth daily with supper.  05/17/18   [provider]  docusate sodium (COLACE) 100 MG capsule Take by mouth. 09/27/17   [provider]  ethyl chloride spray Apply topically. 05/04/18   [provider]  EUTHYROX 88 MCG tablet Take 88 mcg by mouth daily. 05/21/20   [provider]  fluconazole (DIFLUCAN) 50 MG tablet Take 50 mg by mouth at bedtime. 03/19/20   [provider]  furosemide (LASIX) 80 MG tablet Take 80 mg by mouth 2 (two) times daily. 03/20/20   [provider]  gabapentin (NEURONTIN) 100 MG capsule Take 100 mg by mouth 2 (two) times daily.  12/21/17   [provider]  heparin 1000 UNIT/ML injection Inject 1,500 Units into the vein 4 (four) times a week. In 5L of fluid 09/14/17   [provider]  hydrALAZINE (APRESOLINE) 25 MG tablet Take 25 mg by mouth 2 (two) times daily. 04/22/20   [provider]  K Phos Mono-Sod Phos Di & Mono (PHOSPHA 250 NEUTRAL) [phone] MG TABS Take by mouth. 04/04/20   [provider]  lanthanum (FOSRENOL) 1000 MG chewable tablet CHEW AND SWALLOW 1 TABLET THREE TIMES A DAY WITH MEALS  08/24/18   [provider]  levETIRAcetam (KEPPRA) 250 MG tablet Take by mouth.    [provider]  levETIRAcetam (KEPPRA) 500 MG tablet Take 500 mg by mouth 2 (two) times daily. 05/22/20   [provider]  levothyroxine (SYNTHROID, LEVOTHROID) 112 MCG tablet Take 112 mcg by mouth daily. 12/22/17   [provider]  Lidocaine-Prilocaine (EMLA EX) EMLA 2.5-2.5% 04/20/18   [provider]  lidocaine-prilocaine (EMLA) cream APPLY SMALL AMOUNT TO ACCESS SITE (AVF) 3 TIMES A WEEK 60 MINUTES BEFORE DIALYSIS.  COVER WITH OCCLUSIVE DRESSING (SARAN WRAP) 06/21/19   [provider]  magnesium oxide (MAG-OX) 400 MG tablet Take by mouth. 05/21/20   [provider]  multivitamin (RENA-VIT) TABS tablet Take 1 tablet by mouth daily. 09/09/18   [provider]  mupirocin ointment (BACTROBAN) 2 % APPLY TO CANNULATION SITE AS DIRECTED 07/11/19   [provider]  pantoprazole (PROTONIX) 40 MG tablet Take 1 tablet (40 mg total) by mouth 2 (two) times daily. Patient taking differently: Take 40 mg by mouth every evening.  02/01/17   Love, Ivan Anchors, PA-C  polyethylene glycol powder (MIRALAX) 17 GM/SCOOP powder Take by mouth. 09/27/17   [provider]  predniSONE (DELTASONE) 5 MG tablet  05/15/20   [provider]  sodium zirconium cyclosilicate (LOKELMA) 10 g PACK packet Take by mouth. 09/19/19   [provider]  sulfamethoxazole-trimethoprim (BACTRIM) 400-80 MG tablet Take by mouth. 03/20/20   [provider]  UNABLE TO FIND gentamycin cream 0.1% 0.1% 06/03/16   [provider]  valGANciclovir (VALCYTE) 450 MG tablet Take by mouth. 05/15/20   [provider]      Allergies    Camellia and Lisinopril    Review of Systems   Review of Systems  Constitutional:  Positive for fatigue. Negative for fever.  Respiratory:  Positive for shortness of breath.   Cardiovascular:  Negative for chest pain.  Gastrointestinal:  Positive for blood in stool. Negative for abdominal pain, constipation, diarrhea, nausea and vomiting.  Genitourinary:  Negative for vaginal bleeding.  Skin:  Negative for pallor.  Neurological:  Positive for light-headedness. Negative for headaches.   Physical Exam Updated Vital Signs BP (!) 173/80   Pulse 69   Temp 98 F (36.7 C) (Oral)   Resp 17   Ht '5\' 5"'$  (1.651 m)   Wt 57.6 kg   SpO2 98%   BMI 21.13 kg/m  Physical Exam Vitals and nursing note reviewed.  Constitutional:      General: She is not in acute  distress.    Appearance: She is normal weight.  HENT:     Head: Normocephalic and atraumatic.     Mouth/Throat:     Mouth: Mucous membranes are moist.  Eyes:     Conjunctiva/sclera: Conjunctivae normal.  Cardiovascular:     Rate and Rhythm: Normal rate and regular rhythm.     Pulses: Normal pulses.  Pulmonary:     Effort: Pulmonary effort is normal. No respiratory distress.     Breath sounds: Normal breath sounds.  Abdominal:     Palpations: Abdomen is soft.     Tenderness: There is no abdominal tenderness.  Musculoskeletal:     Cervical back: Normal range of motion and neck supple.  Skin:    General: Skin is warm and dry.     Capillary Refill: Capillary refill takes less than 2 seconds.  Neurological:     Mental Status: She is alert.    ED  Results / Procedures / Treatments   Labs (all labs ordered are listed, but only abnormal results are displayed) Labs Reviewed  COMPREHENSIVE METABOLIC PANEL - Abnormal; Notable for the following components:      Result Value   Glucose, Bld 112 (*)    BUN 36 (*)    Creatinine, Ser 2.29 (*)    GFR, Estimated 24 (*)    All other components within normal limits  CBC - Abnormal; Notable for the following components:   WBC 11.7 (*)    RBC 2.99 (*)    Hemoglobin 9.3 (*)    HCT 30.5 (*)    MCV 102.0 (*)    All other components within normal limits  RETICULOCYTES - Abnormal; Notable for the following components:   Retic Ct Pct 3.8 (*)    RBC. 3.03 (*)    Immature Retic Fract 16.3 (*)    All other components within normal limits  PROTIME-INR  POC OCCULT BLOOD, ED  TYPE AND SCREEN    EKG None  Radiology No results found.  Procedures Procedures    Medications Ordered in ED Medications - No data to display  ED Course/ Medical Decision Making/ A&P                           Medical Decision Making Amount and/or Complexity of Data Reviewed Labs: ordered.   This patient presents to the ED for concern of anemia, this  involves an extensive number of treatment options, and is a complaint that carries with it a high risk of complications and morbidity.  The differential diagnosis includes GI bleed, iron deficiency, acute blood loss, and others   Co morbidities that complicate the patient evaluation  History of kidney transplant, history of GI bleed  Lab Tests:  I Ordered, and personally interpreted labs.  The pertinent results include: BUN 36, creatinine 2.29, hemoglobin 9.3, WBC 11.7, immature reticulocyte 16.3, retic ct pct 3.8, RBC 3.03, POC fecal occult positive    Cardiac Monitoring: / EKG:  The patient was maintained on a cardiac monitor.  I personally viewed and interpreted the cardiac monitored which showed an underlying rhythm of: sinus rhythm   Consultations Obtained:  I requested consultation with the hospitalist,  and discussed lab and imaging findings as well as pertinent plan - they recommend: Discussion with [facility] about patient case since patient had renal transplant there   Test / Admission - Considered:  I placed a call to the peer line at [facility] to discuss the patient's case with their providers.  While waiting for callback the patient decided she wanted to discharge home.  The patient states that she remembers that she started an iron supplement last week just before the onset of black stools and believes that is the underlying cause of her black stools.  I have no recent CBC or CMP to know whether or not patient's hemoglobin is close to baseline or whether her creatinine is significantly elevated or not.  The patient states that she has a primary care appointment next week and has a nephrology appointment next month but that she will call to get the nephrology appointment bumped up.  I discussed the risks of leaving with the patient without knowing full extent of her condition and the patient states she still feels that she is ready to discharge home.  This  does seem reasonable.  The patient was given return precautions including worsening shortness of  breath, fatigue, frank blood in stool, and other life threats  Final Clinical Impression(s) / ED Diagnoses Final diagnoses:  Anemia, unspecified type  Black stools  Elevated serum creatinine    Rx / DC Orders ED Discharge Orders     None         Ronny Bacon 12/02/21 2148    Carmin Muskrat, MD 12/02/21 2235

## 2021-12-02 NOTE — Discharge Instructions (Signed)
You were seen today for possible symptomatic anemia.  Your hemoglobin was slightly better than what was measured at your primary care office.  As discussed, I was working on admission for you when you decided to discharge home.  If you have a worsening shortness of breath, weakness, fatigue, or if other life threats, please seek follow-up immediately.  Please see if your nephrologist can see you as well since your creatinine appeared to be elevated at today's visit.

## 2021-12-02 NOTE — ED Triage Notes (Signed)
Patient reports that she was called by her physician and told to come to the ED for a blood transfusion.  Patient states that she has been having black stools x 2-3 days. Patient denies any abdominal pain.

## 2021-12-02 NOTE — ED Notes (Signed)
I provided reinforced discharge education based off of discharge instructions. Pt acknowledged and understood my education. Pt had no further questions/concerns for provider/myself. During my care of this pt, I had Company secretary. I informed RN of treatments and updates to pt condition to RN. I had RN assist when required with treatment, and provide appropriate initial/necessary assessments.

## 2021-12-30 ENCOUNTER — Ambulatory Visit (INDEPENDENT_AMBULATORY_CARE_PROVIDER_SITE_OTHER): Payer: Medicare Other | Admitting: Podiatry

## 2021-12-30 ENCOUNTER — Encounter: Payer: Self-pay | Admitting: Podiatry

## 2021-12-30 DIAGNOSIS — B351 Tinea unguium: Secondary | ICD-10-CM

## 2021-12-30 DIAGNOSIS — Z992 Dependence on renal dialysis: Secondary | ICD-10-CM

## 2021-12-30 DIAGNOSIS — N186 End stage renal disease: Secondary | ICD-10-CM | POA: Diagnosis not present

## 2021-12-30 DIAGNOSIS — M79675 Pain in left toe(s): Secondary | ICD-10-CM | POA: Diagnosis not present

## 2021-12-30 DIAGNOSIS — L608 Other nail disorders: Secondary | ICD-10-CM | POA: Diagnosis not present

## 2021-12-30 DIAGNOSIS — M79674 Pain in right toe(s): Secondary | ICD-10-CM

## 2022-02-10 ENCOUNTER — Ambulatory Visit (HOSPITAL_COMMUNITY)
Admission: RE | Admit: 2022-02-10 | Discharge: 2022-02-10 | Disposition: A | Discharge status: Home / Self Care | Payer: Medicare Other | Source: Ambulatory Visit | Attending: Nephrology | Admitting: Nephrology

## 2022-02-10 VITALS — BP 177/88 | HR 70 | Resp 18

## 2022-02-10 DIAGNOSIS — I120 Hypertensive chronic kidney disease with stage 5 chronic kidney disease or end stage renal disease: Secondary | ICD-10-CM | POA: Insufficient documentation

## 2022-02-10 DIAGNOSIS — D649 Anemia, unspecified: Secondary | ICD-10-CM | POA: Diagnosis present

## 2022-02-10 DIAGNOSIS — Z992 Dependence on renal dialysis: Secondary | ICD-10-CM | POA: Insufficient documentation

## 2022-02-10 DIAGNOSIS — D509 Iron deficiency anemia, unspecified: Secondary | ICD-10-CM | POA: Insufficient documentation

## 2022-02-10 DIAGNOSIS — N186 End stage renal disease: Secondary | ICD-10-CM | POA: Insufficient documentation

## 2022-02-10 DIAGNOSIS — D631 Anemia in chronic kidney disease: Secondary | ICD-10-CM | POA: Diagnosis present

## 2022-02-10 LAB — POCT HEMOGLOBIN-HEMACUE: Hemoglobin: 10.5 g/dL — ABNORMAL LOW (ref 12.0–15.0)

## 2022-02-10 MED ORDER — EPOETIN ALFA 20000 UNIT/ML IJ SOLN
INTRAMUSCULAR | Status: AC
  Filled 2022-02-10: qty 1

## 2022-02-10 MED ORDER — EPOETIN ALFA 20000 UNIT/ML IJ SOLN
20000.0000 [IU] | INTRAMUSCULAR | Status: DC
  Administered 2022-02-10: 20000 [IU] via SUBCUTANEOUS

## 2022-02-23 ENCOUNTER — Other Ambulatory Visit: Payer: Self-pay | Admitting: Gastroenterology

## 2022-03-10 ENCOUNTER — Ambulatory Visit (HOSPITAL_COMMUNITY)
Admission: RE | Admit: 2022-03-10 | Discharge: 2022-03-10 | Disposition: A | Discharge status: Home / Self Care | Payer: Medicare HMO | Source: Ambulatory Visit | Attending: Nephrology | Admitting: Nephrology

## 2022-03-10 ENCOUNTER — Encounter (HOSPITAL_COMMUNITY): Payer: Self-pay

## 2022-03-10 VITALS — BP 171/87 | HR 61 | Temp 97.0°F | Resp 17

## 2022-03-10 DIAGNOSIS — D649 Anemia, unspecified: Secondary | ICD-10-CM | POA: Insufficient documentation

## 2022-03-10 DIAGNOSIS — N186 End stage renal disease: Secondary | ICD-10-CM | POA: Insufficient documentation

## 2022-03-10 DIAGNOSIS — Z992 Dependence on renal dialysis: Secondary | ICD-10-CM | POA: Insufficient documentation

## 2022-03-10 DIAGNOSIS — I120 Hypertensive chronic kidney disease with stage 5 chronic kidney disease or end stage renal disease: Secondary | ICD-10-CM | POA: Insufficient documentation

## 2022-03-10 DIAGNOSIS — D509 Iron deficiency anemia, unspecified: Secondary | ICD-10-CM | POA: Insufficient documentation

## 2022-03-10 DIAGNOSIS — D631 Anemia in chronic kidney disease: Secondary | ICD-10-CM | POA: Diagnosis not present

## 2022-03-10 LAB — CBC
HCT: 36.5 % (ref 36.0–46.0)
Hemoglobin: 11.1 g/dL — ABNORMAL LOW (ref 12.0–15.0)
MCH: 30.2 pg (ref 26.0–34.0)
MCHC: 30.4 g/dL (ref 30.0–36.0)
MCV: 99.2 fL (ref 80.0–100.0)
Platelets: 260 10*3/uL (ref 150–400)
RBC: 3.68 MIL/uL — ABNORMAL LOW (ref 3.87–5.11)
RDW: 14.5 % (ref 11.5–15.5)
WBC: 8 10*3/uL (ref 4.0–10.5)
nRBC: 0 % (ref 0.0–0.2)

## 2022-03-10 MED ORDER — EPOETIN ALFA 20000 UNIT/ML IJ SOLN
20000.0000 [IU] | INTRAMUSCULAR | Status: DC
  Administered 2022-03-10: 20000 [IU] via SUBCUTANEOUS

## 2022-03-10 MED ORDER — EPOETIN ALFA 20000 UNIT/ML IJ SOLN
INTRAMUSCULAR | Status: AC
  Filled 2022-03-10: qty 1

## 2022-03-12 DIAGNOSIS — N390 Urinary tract infection, site not specified: Secondary | ICD-10-CM | POA: Diagnosis not present

## 2022-03-12 DIAGNOSIS — Z940 Kidney transplant status: Secondary | ICD-10-CM | POA: Diagnosis not present

## 2022-03-12 DIAGNOSIS — D631 Anemia in chronic kidney disease: Secondary | ICD-10-CM | POA: Diagnosis not present

## 2022-03-16 DIAGNOSIS — S90121A Contusion of right lesser toe(s) without damage to nail, initial encounter: Secondary | ICD-10-CM | POA: Diagnosis not present

## 2022-03-17 DIAGNOSIS — N189 Chronic kidney disease, unspecified: Secondary | ICD-10-CM | POA: Diagnosis not present

## 2022-03-17 DIAGNOSIS — N2581 Secondary hyperparathyroidism of renal origin: Secondary | ICD-10-CM | POA: Diagnosis not present

## 2022-03-17 DIAGNOSIS — K219 Gastro-esophageal reflux disease without esophagitis: Secondary | ICD-10-CM | POA: Diagnosis not present

## 2022-03-17 DIAGNOSIS — I10 Essential (primary) hypertension: Secondary | ICD-10-CM | POA: Diagnosis not present

## 2022-03-17 DIAGNOSIS — E785 Hyperlipidemia, unspecified: Secondary | ICD-10-CM | POA: Diagnosis not present

## 2022-03-17 DIAGNOSIS — D631 Anemia in chronic kidney disease: Secondary | ICD-10-CM | POA: Diagnosis not present

## 2022-03-17 DIAGNOSIS — Z79899 Other long term (current) drug therapy: Secondary | ICD-10-CM | POA: Diagnosis not present

## 2022-03-17 DIAGNOSIS — R809 Proteinuria, unspecified: Secondary | ICD-10-CM | POA: Diagnosis not present

## 2022-03-17 DIAGNOSIS — Z940 Kidney transplant status: Secondary | ICD-10-CM | POA: Diagnosis not present

## 2022-03-19 LAB — POCT HEMOGLOBIN-HEMACUE: Hemoglobin: 11.2 g/dL — ABNORMAL LOW (ref 12.0–15.0)

## 2022-03-20 DIAGNOSIS — Z79899 Other long term (current) drug therapy: Secondary | ICD-10-CM | POA: Diagnosis not present

## 2022-03-31 ENCOUNTER — Encounter (HOSPITAL_COMMUNITY): Payer: Self-pay

## 2022-04-07 ENCOUNTER — Encounter (HOSPITAL_COMMUNITY)
Admission: RE | Admit: 2022-04-07 | Discharge: 2022-04-07 | Disposition: A | Discharge status: Home / Self Care | Payer: Medicare HMO | Source: Ambulatory Visit | Attending: Nephrology | Admitting: Nephrology

## 2022-04-07 VITALS — BP 140/88 | HR 62 | Temp 97.7°F

## 2022-04-07 DIAGNOSIS — Z992 Dependence on renal dialysis: Secondary | ICD-10-CM | POA: Insufficient documentation

## 2022-04-07 DIAGNOSIS — N186 End stage renal disease: Secondary | ICD-10-CM | POA: Insufficient documentation

## 2022-04-07 DIAGNOSIS — I120 Hypertensive chronic kidney disease with stage 5 chronic kidney disease or end stage renal disease: Secondary | ICD-10-CM | POA: Insufficient documentation

## 2022-04-07 DIAGNOSIS — D509 Iron deficiency anemia, unspecified: Secondary | ICD-10-CM | POA: Insufficient documentation

## 2022-04-07 DIAGNOSIS — D631 Anemia in chronic kidney disease: Secondary | ICD-10-CM | POA: Insufficient documentation

## 2022-04-07 DIAGNOSIS — D649 Anemia, unspecified: Secondary | ICD-10-CM | POA: Insufficient documentation

## 2022-04-07 LAB — COMPREHENSIVE METABOLIC PANEL
ALT: 22 U/L (ref 0–44)
AST: 26 U/L (ref 15–41)
Albumin: 4.5 g/dL (ref 3.5–5.0)
Alkaline Phosphatase: 82 U/L (ref 38–126)
Anion gap: 12 (ref 5–15)
BUN: 81 mg/dL — ABNORMAL HIGH (ref 6–20)
CO2: 16 mmol/L — ABNORMAL LOW (ref 22–32)
Calcium: 9.8 mg/dL (ref 8.9–10.3)
Chloride: 111 mmol/L (ref 98–111)
Creatinine, Ser: 6.57 mg/dL — ABNORMAL HIGH (ref 0.44–1.00)
GFR, Estimated: 7 mL/min — ABNORMAL LOW (ref >60)
Glucose, Bld: 115 mg/dL — ABNORMAL HIGH (ref 70–99)
Potassium: 5.2 mmol/L — ABNORMAL HIGH (ref 3.5–5.1)
Sodium: 139 mmol/L (ref 135–145)
Total Bilirubin: 0.8 mg/dL (ref 0.3–1.2)
Total Protein: 7.5 g/dL (ref 6.5–8.1)

## 2022-04-07 LAB — CBC
HCT: 43.4 % (ref 36.0–46.0)
Hemoglobin: 13.3 g/dL (ref 12.0–15.0)
MCH: 30.3 pg (ref 26.0–34.0)
MCHC: 30.6 g/dL (ref 30.0–36.0)
MCV: 98.9 fL (ref 80.0–100.0)
Platelets: 212 10*3/uL (ref 150–400)
RBC: 4.39 MIL/uL (ref 3.87–5.11)
RDW: 13.4 % (ref 11.5–15.5)
WBC: 5.9 10*3/uL (ref 4.0–10.5)
nRBC: 0 % (ref 0.0–0.2)

## 2022-04-07 LAB — IRON AND TIBC
Iron: 210 ug/dL — ABNORMAL HIGH (ref 28–170)
Saturation Ratios: 84 % — ABNORMAL HIGH (ref 10.4–31.8)
TIBC: 249 ug/dL — ABNORMAL LOW (ref 250–450)
UIBC: 39 ug/dL

## 2022-04-07 LAB — POCT HEMOGLOBIN-HEMACUE: Hemoglobin: 13.2 g/dL (ref 12.0–15.0)

## 2022-04-07 LAB — FERRITIN: Ferritin: 980 ng/mL — ABNORMAL HIGH (ref 11–307)

## 2022-04-07 MED ORDER — EPOETIN ALFA 20000 UNIT/ML IJ SOLN: 20000.0000 [IU] | INTRAMUSCULAR | Status: DC

## 2022-04-10 ENCOUNTER — Other Ambulatory Visit: Payer: Self-pay

## 2022-04-10 ENCOUNTER — Encounter (HOSPITAL_COMMUNITY): Payer: Self-pay

## 2022-04-10 ENCOUNTER — Emergency Department (HOSPITAL_COMMUNITY)
Admission: EM | Admit: 2022-04-10 | Discharge: 2022-04-11 | Discharge status: Against Medical Advice | Payer: Medicare HMO | Attending: Emergency Medicine | Admitting: Emergency Medicine

## 2022-04-10 DIAGNOSIS — Z5321 Procedure and treatment not carried out due to patient leaving prior to being seen by health care provider: Secondary | ICD-10-CM | POA: Diagnosis not present

## 2022-04-10 DIAGNOSIS — R112 Nausea with vomiting, unspecified: Secondary | ICD-10-CM | POA: Insufficient documentation

## 2022-04-10 DIAGNOSIS — R110 Nausea: Secondary | ICD-10-CM | POA: Diagnosis not present

## 2022-04-10 DIAGNOSIS — Z8673 Personal history of transient ischemic attack (TIA), and cerebral infarction without residual deficits: Secondary | ICD-10-CM | POA: Insufficient documentation

## 2022-04-10 DIAGNOSIS — R197 Diarrhea, unspecified: Secondary | ICD-10-CM | POA: Diagnosis not present

## 2022-04-10 DIAGNOSIS — I10 Essential (primary) hypertension: Secondary | ICD-10-CM | POA: Diagnosis not present

## 2022-04-10 DIAGNOSIS — R42 Dizziness and giddiness: Secondary | ICD-10-CM | POA: Insufficient documentation

## 2022-04-10 DIAGNOSIS — R1111 Vomiting without nausea: Secondary | ICD-10-CM | POA: Diagnosis not present

## 2022-04-10 LAB — CBC WITH DIFFERENTIAL/PLATELET
Abs Immature Granulocytes: 0.07 10*3/uL (ref 0.00–0.07)
Basophils Absolute: 0 10*3/uL (ref 0.0–0.1)
Basophils Relative: 0 %
Eosinophils Absolute: 0 10*3/uL (ref 0.0–0.5)
Eosinophils Relative: 0 %
HCT: 39.7 % (ref 36.0–46.0)
Hemoglobin: 12.1 g/dL (ref 12.0–15.0)
Immature Granulocytes: 1 %
Lymphocytes Relative: 10 %
Lymphs Abs: 0.6 10*3/uL — ABNORMAL LOW (ref 0.7–4.0)
MCH: 30 pg (ref 26.0–34.0)
MCHC: 30.5 g/dL (ref 30.0–36.0)
MCV: 98.3 fL (ref 80.0–100.0)
Monocytes Absolute: 0.8 10*3/uL (ref 0.1–1.0)
Monocytes Relative: 12 %
Neutro Abs: 4.9 10*3/uL (ref 1.7–7.7)
Neutrophils Relative %: 77 %
Platelets: 202 10*3/uL (ref 150–400)
RBC: 4.04 MIL/uL (ref 3.87–5.11)
RDW: 13.8 % (ref 11.5–15.5)
WBC: 6.3 10*3/uL (ref 4.0–10.5)
nRBC: 0 % (ref 0.0–0.2)

## 2022-04-10 LAB — COMPREHENSIVE METABOLIC PANEL
ALT: 24 U/L (ref 0–44)
AST: 33 U/L (ref 15–41)
Albumin: 4.4 g/dL (ref 3.5–5.0)
Alkaline Phosphatase: 73 U/L (ref 38–126)
Anion gap: 10 (ref 5–15)
BUN: 83 mg/dL — ABNORMAL HIGH (ref 6–20)
CO2: 12 mmol/L — ABNORMAL LOW (ref 22–32)
Calcium: 9.2 mg/dL (ref 8.9–10.3)
Chloride: 115 mmol/L — ABNORMAL HIGH (ref 98–111)
Creatinine, Ser: 4.72 mg/dL — ABNORMAL HIGH (ref 0.44–1.00)
GFR, Estimated: 10 mL/min — ABNORMAL LOW (ref >60)
Glucose, Bld: 105 mg/dL — ABNORMAL HIGH (ref 70–99)
Potassium: 5.9 mmol/L — ABNORMAL HIGH (ref 3.5–5.1)
Sodium: 137 mmol/L (ref 135–145)
Total Bilirubin: 1 mg/dL (ref 0.3–1.2)
Total Protein: 7.3 g/dL (ref 6.5–8.1)

## 2022-04-10 LAB — LIPASE, BLOOD: Lipase: 70 U/L — ABNORMAL HIGH (ref 11–51)

## 2022-04-10 NOTE — ED Triage Notes (Signed)
BIB [facility] with c/o nausea, vomiting, and diarrhea x 2 weeks. Reports intermittent dizziness.   Hx of kidney transplant and stroke. Scheduled for EGD & colonoscopy in November.

## 2022-04-10 NOTE — ED Provider Triage Note (Signed)
Emergency Medicine Provider Triage Evaluation Note  Terry Juarez , a 56 y.o. female  was evaluated in triage.  Pt complains of nausea vomiting and diarrhea for 2 weeks.  No abdominal pain.  No fevers.  No hematemesis or blood in her stool.  She has a prior history of renal transplant on immunosuppressants, stroke, hypertension, GERD, seizures.  She is feeling dizzy and lightheaded.  No recent antibiotic fusions.  No recent travel.  Review of Systems  Positive: Nausea vomiting diarrhea Negative: Fevers abdominal pain  Physical Exam  BP 132/87 (BP Location: Left Arm)   Pulse 82   Temp 99 F (37.2 C) (Oral)   Resp 16   Ht '5\' 5"'$  (1.651 m)   Wt 52.2 kg   SpO2 97%   BMI 19.14 kg/m  Gen:   Awake, no distress    Resp:  Normal effort   MSK:   Moves extremities without difficulty   Other:     Medical Decision Making  Medically screening exam initiated at 10:32 PM.  Appropriate orders placed.  [name] was informed that the remainder of the evaluation will be completed by another provider, this initial triage assessment does not replace that evaluation, and the importance of remaining in the ED until their evaluation is complete.      Malvin Johns, MD 04/10/22 2233

## 2022-04-14 DIAGNOSIS — Z87898 Personal history of other specified conditions: Secondary | ICD-10-CM | POA: Diagnosis not present

## 2022-04-14 DIAGNOSIS — Z85528 Personal history of other malignant neoplasm of kidney: Secondary | ICD-10-CM | POA: Diagnosis not present

## 2022-04-14 DIAGNOSIS — L89892 Pressure ulcer of other site, stage 2: Secondary | ICD-10-CM | POA: Diagnosis not present

## 2022-04-14 DIAGNOSIS — Z7969 Long term (current) use of other immunomodulators and immunosuppressants: Secondary | ICD-10-CM | POA: Diagnosis not present

## 2022-04-14 DIAGNOSIS — D65 Disseminated intravascular coagulation [defibrination syndrome]: Secondary | ICD-10-CM | POA: Diagnosis not present

## 2022-04-14 DIAGNOSIS — J9383 Other pneumothorax: Secondary | ICD-10-CM | POA: Diagnosis not present

## 2022-04-14 DIAGNOSIS — I469 Cardiac arrest, cause unspecified: Secondary | ICD-10-CM | POA: Diagnosis not present

## 2022-04-14 DIAGNOSIS — E876 Hypokalemia: Secondary | ICD-10-CM | POA: Diagnosis not present

## 2022-04-14 DIAGNOSIS — R0902 Hypoxemia: Secondary | ICD-10-CM | POA: Diagnosis not present

## 2022-04-14 DIAGNOSIS — I6930 Unspecified sequelae of cerebral infarction: Secondary | ICD-10-CM | POA: Diagnosis not present

## 2022-04-14 DIAGNOSIS — Z9911 Dependence on respirator [ventilator] status: Secondary | ICD-10-CM | POA: Diagnosis not present

## 2022-04-14 DIAGNOSIS — D649 Anemia, unspecified: Secondary | ICD-10-CM | POA: Diagnosis not present

## 2022-04-14 DIAGNOSIS — R198 Other specified symptoms and signs involving the digestive system and abdomen: Secondary | ICD-10-CM | POA: Diagnosis not present

## 2022-04-14 DIAGNOSIS — N183 Chronic kidney disease, stage 3 unspecified: Secondary | ICD-10-CM | POA: Diagnosis not present

## 2022-04-14 DIAGNOSIS — Z743 Need for continuous supervision: Secondary | ICD-10-CM | POA: Diagnosis not present

## 2022-04-14 DIAGNOSIS — G9389 Other specified disorders of brain: Secondary | ICD-10-CM | POA: Diagnosis not present

## 2022-04-14 DIAGNOSIS — Z515 Encounter for palliative care: Secondary | ICD-10-CM | POA: Diagnosis not present

## 2022-04-14 DIAGNOSIS — Z931 Gastrostomy status: Secondary | ICD-10-CM | POA: Diagnosis not present

## 2022-04-14 DIAGNOSIS — Z8674 Personal history of sudden cardiac arrest: Secondary | ICD-10-CM | POA: Diagnosis not present

## 2022-04-14 DIAGNOSIS — H189 Unspecified disorder of cornea: Secondary | ICD-10-CM | POA: Diagnosis not present

## 2022-04-14 DIAGNOSIS — J9811 Atelectasis: Secondary | ICD-10-CM | POA: Diagnosis not present

## 2022-04-14 DIAGNOSIS — R578 Other shock: Secondary | ICD-10-CM | POA: Diagnosis not present

## 2022-04-14 DIAGNOSIS — J982 Interstitial emphysema: Secondary | ICD-10-CM | POA: Diagnosis not present

## 2022-04-14 DIAGNOSIS — N189 Chronic kidney disease, unspecified: Secondary | ICD-10-CM | POA: Diagnosis not present

## 2022-04-14 DIAGNOSIS — B965 Pseudomonas (aeruginosa) (mallei) (pseudomallei) as the cause of diseases classified elsewhere: Secondary | ICD-10-CM | POA: Diagnosis not present

## 2022-04-14 DIAGNOSIS — G40909 Epilepsy, unspecified, not intractable, without status epilepticus: Secondary | ICD-10-CM | POA: Diagnosis not present

## 2022-04-14 DIAGNOSIS — R531 Weakness: Secondary | ICD-10-CM | POA: Diagnosis not present

## 2022-04-14 DIAGNOSIS — I69365 Other paralytic syndrome following cerebral infarction, bilateral: Secondary | ICD-10-CM | POA: Diagnosis not present

## 2022-04-14 DIAGNOSIS — Z7982 Long term (current) use of aspirin: Secondary | ICD-10-CM | POA: Diagnosis not present

## 2022-04-14 DIAGNOSIS — I69154 Hemiplegia and hemiparesis following nontraumatic intracerebral hemorrhage affecting left non-dominant side: Secondary | ICD-10-CM | POA: Diagnosis not present

## 2022-04-14 DIAGNOSIS — R1310 Dysphagia, unspecified: Secondary | ICD-10-CM | POA: Diagnosis not present

## 2022-04-14 DIAGNOSIS — Z781 Physical restraint status: Secondary | ICD-10-CM | POA: Diagnosis not present

## 2022-04-14 DIAGNOSIS — R509 Fever, unspecified: Secondary | ICD-10-CM | POA: Diagnosis not present

## 2022-04-14 DIAGNOSIS — R011 Cardiac murmur, unspecified: Secondary | ICD-10-CM | POA: Diagnosis not present

## 2022-04-14 DIAGNOSIS — A498 Other bacterial infections of unspecified site: Secondary | ICD-10-CM | POA: Diagnosis not present

## 2022-04-14 DIAGNOSIS — J9809 Other diseases of bronchus, not elsewhere classified: Secondary | ICD-10-CM | POA: Diagnosis not present

## 2022-04-14 DIAGNOSIS — Z79899 Other long term (current) drug therapy: Secondary | ICD-10-CM | POA: Diagnosis not present

## 2022-04-14 DIAGNOSIS — G9340 Encephalopathy, unspecified: Secondary | ICD-10-CM | POA: Diagnosis not present

## 2022-04-14 DIAGNOSIS — Z930 Tracheostomy status: Secondary | ICD-10-CM | POA: Diagnosis not present

## 2022-04-14 DIAGNOSIS — J9611 Chronic respiratory failure with hypoxia: Secondary | ICD-10-CM | POA: Diagnosis not present

## 2022-04-14 DIAGNOSIS — Z978 Presence of other specified devices: Secondary | ICD-10-CM | POA: Diagnosis not present

## 2022-04-14 DIAGNOSIS — R9401 Abnormal electroencephalogram [EEG]: Secondary | ICD-10-CM | POA: Diagnosis not present

## 2022-04-14 DIAGNOSIS — R7989 Other specified abnormal findings of blood chemistry: Secondary | ICD-10-CM | POA: Diagnosis not present

## 2022-04-14 DIAGNOSIS — G928 Other toxic encephalopathy: Secondary | ICD-10-CM | POA: Diagnosis not present

## 2022-04-14 DIAGNOSIS — R6520 Severe sepsis without septic shock: Secondary | ICD-10-CM | POA: Diagnosis not present

## 2022-04-14 DIAGNOSIS — G253 Myoclonus: Secondary | ICD-10-CM | POA: Diagnosis not present

## 2022-04-14 DIAGNOSIS — J9621 Acute and chronic respiratory failure with hypoxia: Secondary | ICD-10-CM | POA: Diagnosis not present

## 2022-04-14 DIAGNOSIS — Z8673 Personal history of transient ischemic attack (TIA), and cerebral infarction without residual deficits: Secondary | ICD-10-CM | POA: Diagnosis not present

## 2022-04-14 DIAGNOSIS — R579 Shock, unspecified: Secondary | ICD-10-CM | POA: Diagnosis not present

## 2022-04-14 DIAGNOSIS — I120 Hypertensive chronic kidney disease with stage 5 chronic kidney disease or end stage renal disease: Secondary | ICD-10-CM | POA: Diagnosis not present

## 2022-04-14 DIAGNOSIS — R633 Feeding difficulties, unspecified: Secondary | ICD-10-CM | POA: Diagnosis not present

## 2022-04-14 DIAGNOSIS — Z87891 Personal history of nicotine dependence: Secondary | ICD-10-CM | POA: Diagnosis not present

## 2022-04-14 DIAGNOSIS — D72829 Elevated white blood cell count, unspecified: Secondary | ICD-10-CM | POA: Diagnosis not present

## 2022-04-14 DIAGNOSIS — M7989 Other specified soft tissue disorders: Secondary | ICD-10-CM | POA: Diagnosis not present

## 2022-04-14 DIAGNOSIS — R112 Nausea with vomiting, unspecified: Secondary | ICD-10-CM | POA: Diagnosis not present

## 2022-04-14 DIAGNOSIS — F419 Anxiety disorder, unspecified: Secondary | ICD-10-CM | POA: Diagnosis not present

## 2022-04-14 DIAGNOSIS — J189 Pneumonia, unspecified organism: Secondary | ICD-10-CM | POA: Diagnosis not present

## 2022-04-14 DIAGNOSIS — R6510 Systemic inflammatory response syndrome (SIRS) of non-infectious origin without acute organ dysfunction: Secondary | ICD-10-CM | POA: Diagnosis not present

## 2022-04-14 DIAGNOSIS — J151 Pneumonia due to Pseudomonas: Secondary | ICD-10-CM | POA: Diagnosis not present

## 2022-04-14 DIAGNOSIS — G9341 Metabolic encephalopathy: Secondary | ICD-10-CM | POA: Diagnosis not present

## 2022-04-14 DIAGNOSIS — I10 Essential (primary) hypertension: Secondary | ICD-10-CM | POA: Diagnosis not present

## 2022-04-14 DIAGNOSIS — R29898 Other symptoms and signs involving the musculoskeletal system: Secondary | ICD-10-CM | POA: Diagnosis not present

## 2022-04-14 DIAGNOSIS — E039 Hypothyroidism, unspecified: Secondary | ICD-10-CM | POA: Diagnosis not present

## 2022-04-14 DIAGNOSIS — I129 Hypertensive chronic kidney disease with stage 1 through stage 4 chronic kidney disease, or unspecified chronic kidney disease: Secondary | ICD-10-CM | POA: Diagnosis not present

## 2022-04-14 DIAGNOSIS — R600 Localized edema: Secondary | ICD-10-CM | POA: Diagnosis not present

## 2022-04-14 DIAGNOSIS — R834 Abnormal immunological findings in cerebrospinal fluid: Secondary | ICD-10-CM | POA: Diagnosis not present

## 2022-04-14 DIAGNOSIS — R569 Unspecified convulsions: Secondary | ICD-10-CM | POA: Diagnosis not present

## 2022-04-14 DIAGNOSIS — J9620 Acute and chronic respiratory failure, unspecified whether with hypoxia or hypercapnia: Secondary | ICD-10-CM | POA: Diagnosis not present

## 2022-04-14 DIAGNOSIS — Z4682 Encounter for fitting and adjustment of non-vascular catheter: Secondary | ICD-10-CM | POA: Diagnosis not present

## 2022-04-14 DIAGNOSIS — R1312 Dysphagia, oropharyngeal phase: Secondary | ICD-10-CM | POA: Diagnosis not present

## 2022-04-14 DIAGNOSIS — Z20822 Contact with and (suspected) exposure to covid-19: Secondary | ICD-10-CM | POA: Diagnosis not present

## 2022-04-14 DIAGNOSIS — J9601 Acute respiratory failure with hypoxia: Secondary | ICD-10-CM | POA: Diagnosis not present

## 2022-04-14 DIAGNOSIS — G0490 Encephalitis and encephalomyelitis, unspecified: Secondary | ICD-10-CM | POA: Diagnosis not present

## 2022-04-14 DIAGNOSIS — J939 Pneumothorax, unspecified: Secondary | ICD-10-CM | POA: Diagnosis not present

## 2022-04-14 DIAGNOSIS — R197 Diarrhea, unspecified: Secondary | ICD-10-CM | POA: Diagnosis not present

## 2022-04-14 DIAGNOSIS — Z95828 Presence of other vascular implants and grafts: Secondary | ICD-10-CM | POA: Diagnosis not present

## 2022-04-14 DIAGNOSIS — J9690 Respiratory failure, unspecified, unspecified whether with hypoxia or hypercapnia: Secondary | ICD-10-CM | POA: Diagnosis not present

## 2022-04-14 DIAGNOSIS — G039 Meningitis, unspecified: Secondary | ICD-10-CM | POA: Diagnosis not present

## 2022-04-14 DIAGNOSIS — K567 Ileus, unspecified: Secondary | ICD-10-CM | POA: Diagnosis not present

## 2022-04-14 DIAGNOSIS — G8250 Quadriplegia, unspecified: Secondary | ICD-10-CM | POA: Diagnosis not present

## 2022-04-14 DIAGNOSIS — Z7189 Other specified counseling: Secondary | ICD-10-CM | POA: Diagnosis not present

## 2022-04-14 DIAGNOSIS — Z992 Dependence on renal dialysis: Secondary | ICD-10-CM | POA: Diagnosis not present

## 2022-04-14 DIAGNOSIS — I639 Cerebral infarction, unspecified: Secondary | ICD-10-CM | POA: Diagnosis not present

## 2022-04-14 DIAGNOSIS — Z6821 Body mass index (BMI) 21.0-21.9, adult: Secondary | ICD-10-CM | POA: Diagnosis not present

## 2022-04-14 DIAGNOSIS — R4020 Unspecified coma: Secondary | ICD-10-CM | POA: Diagnosis not present

## 2022-04-14 DIAGNOSIS — D84821 Immunodeficiency due to drugs: Secondary | ICD-10-CM | POA: Diagnosis not present

## 2022-04-14 DIAGNOSIS — E870 Hyperosmolality and hypernatremia: Secondary | ICD-10-CM | POA: Diagnosis not present

## 2022-04-14 DIAGNOSIS — J9819 Other pulmonary collapse: Secondary | ICD-10-CM | POA: Diagnosis not present

## 2022-04-14 DIAGNOSIS — Z66 Do not resuscitate: Secondary | ICD-10-CM | POA: Diagnosis not present

## 2022-04-14 DIAGNOSIS — J9600 Acute respiratory failure, unspecified whether with hypoxia or hypercapnia: Secondary | ICD-10-CM | POA: Diagnosis not present

## 2022-04-14 DIAGNOSIS — J95851 Ventilator associated pneumonia: Secondary | ICD-10-CM | POA: Diagnosis not present

## 2022-04-14 DIAGNOSIS — F17211 Nicotine dependence, cigarettes, in remission: Secondary | ICD-10-CM | POA: Diagnosis not present

## 2022-04-14 DIAGNOSIS — N179 Acute kidney failure, unspecified: Secondary | ICD-10-CM | POA: Diagnosis not present

## 2022-04-14 DIAGNOSIS — Z4822 Encounter for aftercare following kidney transplant: Secondary | ICD-10-CM | POA: Diagnosis not present

## 2022-04-14 DIAGNOSIS — Z794 Long term (current) use of insulin: Secondary | ICD-10-CM | POA: Diagnosis not present

## 2022-04-14 DIAGNOSIS — E46 Unspecified protein-calorie malnutrition: Secondary | ICD-10-CM | POA: Diagnosis not present

## 2022-04-14 DIAGNOSIS — J9610 Chronic respiratory failure, unspecified whether with hypoxia or hypercapnia: Secondary | ICD-10-CM | POA: Diagnosis not present

## 2022-04-14 DIAGNOSIS — N186 End stage renal disease: Secondary | ICD-10-CM | POA: Diagnosis not present

## 2022-04-14 DIAGNOSIS — T8619 Other complication of kidney transplant: Secondary | ICD-10-CM | POA: Diagnosis not present

## 2022-04-14 DIAGNOSIS — N170 Acute kidney failure with tubular necrosis: Secondary | ICD-10-CM | POA: Diagnosis not present

## 2022-04-14 DIAGNOSIS — R4182 Altered mental status, unspecified: Secondary | ICD-10-CM | POA: Diagnosis not present

## 2022-04-14 DIAGNOSIS — E785 Hyperlipidemia, unspecified: Secondary | ICD-10-CM | POA: Diagnosis not present

## 2022-04-14 DIAGNOSIS — R9431 Abnormal electrocardiogram [ECG] [EKG]: Secondary | ICD-10-CM | POA: Diagnosis not present

## 2022-04-14 DIAGNOSIS — R1110 Vomiting, unspecified: Secondary | ICD-10-CM | POA: Diagnosis not present

## 2022-04-14 DIAGNOSIS — Z4659 Encounter for fitting and adjustment of other gastrointestinal appliance and device: Secondary | ICD-10-CM | POA: Diagnosis not present

## 2022-04-14 DIAGNOSIS — A419 Sepsis, unspecified organism: Secondary | ICD-10-CM | POA: Diagnosis not present

## 2022-04-14 DIAGNOSIS — Z905 Acquired absence of kidney: Secondary | ICD-10-CM | POA: Diagnosis not present

## 2022-04-14 DIAGNOSIS — D8481 Immunodeficiency due to conditions classified elsewhere: Secondary | ICD-10-CM | POA: Diagnosis not present

## 2022-04-14 DIAGNOSIS — E8721 Acute metabolic acidosis: Secondary | ICD-10-CM | POA: Diagnosis not present

## 2022-04-14 DIAGNOSIS — Z5181 Encounter for therapeutic drug level monitoring: Secondary | ICD-10-CM | POA: Diagnosis not present

## 2022-04-14 DIAGNOSIS — J810 Acute pulmonary edema: Secondary | ICD-10-CM | POA: Diagnosis not present

## 2022-04-14 DIAGNOSIS — D849 Immunodeficiency, unspecified: Secondary | ICD-10-CM | POA: Diagnosis not present

## 2022-04-14 DIAGNOSIS — Z940 Kidney transplant status: Secondary | ICD-10-CM | POA: Diagnosis not present

## 2022-04-14 DIAGNOSIS — R001 Bradycardia, unspecified: Secondary | ICD-10-CM | POA: Diagnosis not present

## 2022-04-14 DIAGNOSIS — R0603 Acute respiratory distress: Secondary | ICD-10-CM | POA: Diagnosis not present

## 2022-04-14 DIAGNOSIS — E875 Hyperkalemia: Secondary | ICD-10-CM | POA: Diagnosis not present

## 2022-04-14 DIAGNOSIS — E871 Hypo-osmolality and hyponatremia: Secondary | ICD-10-CM | POA: Diagnosis not present

## 2022-04-14 DIAGNOSIS — R1084 Generalized abdominal pain: Secondary | ICD-10-CM | POA: Diagnosis not present

## 2022-04-14 DIAGNOSIS — G40911 Epilepsy, unspecified, intractable, with status epilepticus: Secondary | ICD-10-CM | POA: Diagnosis not present

## 2022-04-14 DIAGNOSIS — R7881 Bacteremia: Secondary | ICD-10-CM | POA: Diagnosis not present

## 2022-04-14 DIAGNOSIS — A9231 West Nile virus infection with encephalitis: Secondary | ICD-10-CM | POA: Diagnosis not present

## 2022-04-14 DIAGNOSIS — D62 Acute posthemorrhagic anemia: Secondary | ICD-10-CM | POA: Diagnosis not present

## 2022-04-14 DIAGNOSIS — R140 Abdominal distension (gaseous): Secondary | ICD-10-CM | POA: Diagnosis not present

## 2022-04-14 DIAGNOSIS — J329 Chronic sinusitis, unspecified: Secondary | ICD-10-CM | POA: Diagnosis not present

## 2022-04-14 DIAGNOSIS — G40901 Epilepsy, unspecified, not intractable, with status epilepticus: Secondary | ICD-10-CM | POA: Diagnosis not present

## 2022-04-14 DIAGNOSIS — K5730 Diverticulosis of large intestine without perforation or abscess without bleeding: Secondary | ICD-10-CM | POA: Diagnosis not present

## 2022-04-14 DIAGNOSIS — R0602 Shortness of breath: Secondary | ICD-10-CM | POA: Diagnosis not present

## 2022-04-14 DIAGNOSIS — E872 Acidosis, unspecified: Secondary | ICD-10-CM | POA: Diagnosis not present

## 2022-04-15 DIAGNOSIS — I129 Hypertensive chronic kidney disease with stage 1 through stage 4 chronic kidney disease, or unspecified chronic kidney disease: Secondary | ICD-10-CM | POA: Diagnosis not present

## 2022-04-15 DIAGNOSIS — I120 Hypertensive chronic kidney disease with stage 5 chronic kidney disease or end stage renal disease: Secondary | ICD-10-CM | POA: Diagnosis not present

## 2022-04-15 DIAGNOSIS — R7989 Other specified abnormal findings of blood chemistry: Secondary | ICD-10-CM | POA: Diagnosis not present

## 2022-04-15 DIAGNOSIS — Z794 Long term (current) use of insulin: Secondary | ICD-10-CM | POA: Diagnosis not present

## 2022-04-15 DIAGNOSIS — Z79899 Other long term (current) drug therapy: Secondary | ICD-10-CM | POA: Diagnosis not present

## 2022-04-15 DIAGNOSIS — E875 Hyperkalemia: Secondary | ICD-10-CM | POA: Diagnosis not present

## 2022-04-15 DIAGNOSIS — N179 Acute kidney failure, unspecified: Secondary | ICD-10-CM | POA: Diagnosis not present

## 2022-04-15 DIAGNOSIS — D849 Immunodeficiency, unspecified: Secondary | ICD-10-CM | POA: Diagnosis not present

## 2022-04-15 DIAGNOSIS — Z940 Kidney transplant status: Secondary | ICD-10-CM | POA: Diagnosis not present

## 2022-04-15 DIAGNOSIS — R112 Nausea with vomiting, unspecified: Secondary | ICD-10-CM | POA: Diagnosis not present

## 2022-04-15 DIAGNOSIS — N186 End stage renal disease: Secondary | ICD-10-CM | POA: Diagnosis not present

## 2022-04-15 DIAGNOSIS — N183 Chronic kidney disease, stage 3 unspecified: Secondary | ICD-10-CM | POA: Diagnosis not present

## 2022-04-15 DIAGNOSIS — R197 Diarrhea, unspecified: Secondary | ICD-10-CM | POA: Diagnosis not present

## 2022-04-15 DIAGNOSIS — E872 Acidosis, unspecified: Secondary | ICD-10-CM | POA: Diagnosis not present

## 2022-04-15 DIAGNOSIS — Z5181 Encounter for therapeutic drug level monitoring: Secondary | ICD-10-CM | POA: Diagnosis not present

## 2022-04-15 DIAGNOSIS — E8721 Acute metabolic acidosis: Secondary | ICD-10-CM | POA: Diagnosis not present

## 2022-04-15 DIAGNOSIS — Z4822 Encounter for aftercare following kidney transplant: Secondary | ICD-10-CM | POA: Diagnosis not present

## 2022-04-16 DIAGNOSIS — R9431 Abnormal electrocardiogram [ECG] [EKG]: Secondary | ICD-10-CM | POA: Diagnosis not present

## 2022-04-16 DIAGNOSIS — G9389 Other specified disorders of brain: Secondary | ICD-10-CM | POA: Diagnosis not present

## 2022-04-16 DIAGNOSIS — Z8673 Personal history of transient ischemic attack (TIA), and cerebral infarction without residual deficits: Secondary | ICD-10-CM | POA: Diagnosis not present

## 2022-04-16 DIAGNOSIS — N186 End stage renal disease: Secondary | ICD-10-CM | POA: Diagnosis not present

## 2022-04-16 DIAGNOSIS — R7989 Other specified abnormal findings of blood chemistry: Secondary | ICD-10-CM | POA: Diagnosis not present

## 2022-04-16 DIAGNOSIS — R531 Weakness: Secondary | ICD-10-CM | POA: Diagnosis not present

## 2022-04-16 DIAGNOSIS — E872 Acidosis, unspecified: Secondary | ICD-10-CM | POA: Diagnosis not present

## 2022-04-16 DIAGNOSIS — N183 Chronic kidney disease, stage 3 unspecified: Secondary | ICD-10-CM | POA: Diagnosis not present

## 2022-04-16 DIAGNOSIS — N179 Acute kidney failure, unspecified: Secondary | ICD-10-CM | POA: Diagnosis not present

## 2022-04-16 DIAGNOSIS — I129 Hypertensive chronic kidney disease with stage 1 through stage 4 chronic kidney disease, or unspecified chronic kidney disease: Secondary | ICD-10-CM | POA: Diagnosis not present

## 2022-04-16 DIAGNOSIS — T8619 Other complication of kidney transplant: Secondary | ICD-10-CM | POA: Diagnosis not present

## 2022-04-16 DIAGNOSIS — G9340 Encephalopathy, unspecified: Secondary | ICD-10-CM | POA: Diagnosis not present

## 2022-04-16 DIAGNOSIS — I120 Hypertensive chronic kidney disease with stage 5 chronic kidney disease or end stage renal disease: Secondary | ICD-10-CM | POA: Diagnosis not present

## 2022-04-16 DIAGNOSIS — G253 Myoclonus: Secondary | ICD-10-CM | POA: Diagnosis not present

## 2022-04-16 DIAGNOSIS — R112 Nausea with vomiting, unspecified: Secondary | ICD-10-CM | POA: Diagnosis not present

## 2022-04-16 DIAGNOSIS — Z85528 Personal history of other malignant neoplasm of kidney: Secondary | ICD-10-CM | POA: Diagnosis not present

## 2022-04-16 DIAGNOSIS — Z905 Acquired absence of kidney: Secondary | ICD-10-CM | POA: Diagnosis not present

## 2022-04-16 DIAGNOSIS — Z940 Kidney transplant status: Secondary | ICD-10-CM | POA: Diagnosis not present

## 2022-04-16 DIAGNOSIS — R9401 Abnormal electroencephalogram [EEG]: Secondary | ICD-10-CM | POA: Diagnosis not present

## 2022-04-16 DIAGNOSIS — R4182 Altered mental status, unspecified: Secondary | ICD-10-CM | POA: Diagnosis not present

## 2022-04-16 DIAGNOSIS — E8721 Acute metabolic acidosis: Secondary | ICD-10-CM | POA: Diagnosis not present

## 2022-04-16 DIAGNOSIS — K5730 Diverticulosis of large intestine without perforation or abscess without bleeding: Secondary | ICD-10-CM | POA: Diagnosis not present

## 2022-04-16 DIAGNOSIS — R509 Fever, unspecified: Secondary | ICD-10-CM | POA: Diagnosis not present

## 2022-04-16 DIAGNOSIS — R197 Diarrhea, unspecified: Secondary | ICD-10-CM | POA: Diagnosis not present

## 2022-04-17 DIAGNOSIS — A419 Sepsis, unspecified organism: Secondary | ICD-10-CM | POA: Diagnosis not present

## 2022-04-17 DIAGNOSIS — R4182 Altered mental status, unspecified: Secondary | ICD-10-CM | POA: Diagnosis not present

## 2022-04-17 DIAGNOSIS — E871 Hypo-osmolality and hyponatremia: Secondary | ICD-10-CM | POA: Diagnosis not present

## 2022-04-17 DIAGNOSIS — I129 Hypertensive chronic kidney disease with stage 1 through stage 4 chronic kidney disease, or unspecified chronic kidney disease: Secondary | ICD-10-CM | POA: Diagnosis not present

## 2022-04-17 DIAGNOSIS — R112 Nausea with vomiting, unspecified: Secondary | ICD-10-CM | POA: Diagnosis not present

## 2022-04-17 DIAGNOSIS — R7989 Other specified abnormal findings of blood chemistry: Secondary | ICD-10-CM | POA: Diagnosis not present

## 2022-04-17 DIAGNOSIS — Z9911 Dependence on respirator [ventilator] status: Secondary | ICD-10-CM | POA: Diagnosis not present

## 2022-04-17 DIAGNOSIS — Z940 Kidney transplant status: Secondary | ICD-10-CM | POA: Diagnosis not present

## 2022-04-17 DIAGNOSIS — R197 Diarrhea, unspecified: Secondary | ICD-10-CM | POA: Diagnosis not present

## 2022-04-17 DIAGNOSIS — D8481 Immunodeficiency due to conditions classified elsewhere: Secondary | ICD-10-CM | POA: Diagnosis not present

## 2022-04-17 DIAGNOSIS — N179 Acute kidney failure, unspecified: Secondary | ICD-10-CM | POA: Diagnosis not present

## 2022-04-17 DIAGNOSIS — G039 Meningitis, unspecified: Secondary | ICD-10-CM | POA: Diagnosis not present

## 2022-04-17 DIAGNOSIS — T8619 Other complication of kidney transplant: Secondary | ICD-10-CM | POA: Diagnosis not present

## 2022-04-17 DIAGNOSIS — Z4659 Encounter for fitting and adjustment of other gastrointestinal appliance and device: Secondary | ICD-10-CM | POA: Diagnosis not present

## 2022-04-17 DIAGNOSIS — Z4682 Encounter for fitting and adjustment of non-vascular catheter: Secondary | ICD-10-CM | POA: Diagnosis not present

## 2022-04-17 DIAGNOSIS — G9340 Encephalopathy, unspecified: Secondary | ICD-10-CM | POA: Diagnosis not present

## 2022-04-17 DIAGNOSIS — G928 Other toxic encephalopathy: Secondary | ICD-10-CM | POA: Diagnosis not present

## 2022-04-17 DIAGNOSIS — R6520 Severe sepsis without septic shock: Secondary | ICD-10-CM | POA: Diagnosis not present

## 2022-04-17 DIAGNOSIS — N183 Chronic kidney disease, stage 3 unspecified: Secondary | ICD-10-CM | POA: Diagnosis not present

## 2022-04-17 DIAGNOSIS — E872 Acidosis, unspecified: Secondary | ICD-10-CM | POA: Diagnosis not present

## 2022-04-18 DIAGNOSIS — G928 Other toxic encephalopathy: Secondary | ICD-10-CM | POA: Diagnosis not present

## 2022-04-18 DIAGNOSIS — Z940 Kidney transplant status: Secondary | ICD-10-CM | POA: Diagnosis not present

## 2022-04-18 DIAGNOSIS — R7989 Other specified abnormal findings of blood chemistry: Secondary | ICD-10-CM | POA: Diagnosis not present

## 2022-04-18 DIAGNOSIS — R531 Weakness: Secondary | ICD-10-CM | POA: Diagnosis not present

## 2022-04-18 DIAGNOSIS — E872 Acidosis, unspecified: Secondary | ICD-10-CM | POA: Diagnosis not present

## 2022-04-18 DIAGNOSIS — J9601 Acute respiratory failure with hypoxia: Secondary | ICD-10-CM | POA: Diagnosis not present

## 2022-04-18 DIAGNOSIS — I129 Hypertensive chronic kidney disease with stage 1 through stage 4 chronic kidney disease, or unspecified chronic kidney disease: Secondary | ICD-10-CM | POA: Diagnosis not present

## 2022-04-18 DIAGNOSIS — Z9911 Dependence on respirator [ventilator] status: Secondary | ICD-10-CM | POA: Diagnosis not present

## 2022-04-18 DIAGNOSIS — D8481 Immunodeficiency due to conditions classified elsewhere: Secondary | ICD-10-CM | POA: Diagnosis not present

## 2022-04-18 DIAGNOSIS — G039 Meningitis, unspecified: Secondary | ICD-10-CM | POA: Diagnosis not present

## 2022-04-18 DIAGNOSIS — N179 Acute kidney failure, unspecified: Secondary | ICD-10-CM | POA: Diagnosis not present

## 2022-04-18 DIAGNOSIS — N183 Chronic kidney disease, stage 3 unspecified: Secondary | ICD-10-CM | POA: Diagnosis not present

## 2022-04-18 DIAGNOSIS — G9340 Encephalopathy, unspecified: Secondary | ICD-10-CM | POA: Diagnosis not present

## 2022-04-18 DIAGNOSIS — T8619 Other complication of kidney transplant: Secondary | ICD-10-CM | POA: Diagnosis not present

## 2022-04-19 DIAGNOSIS — E872 Acidosis, unspecified: Secondary | ICD-10-CM | POA: Diagnosis not present

## 2022-04-19 DIAGNOSIS — T8619 Other complication of kidney transplant: Secondary | ICD-10-CM | POA: Diagnosis not present

## 2022-04-19 DIAGNOSIS — N179 Acute kidney failure, unspecified: Secondary | ICD-10-CM | POA: Diagnosis not present

## 2022-04-19 DIAGNOSIS — D8481 Immunodeficiency due to conditions classified elsewhere: Secondary | ICD-10-CM | POA: Diagnosis not present

## 2022-04-19 DIAGNOSIS — N183 Chronic kidney disease, stage 3 unspecified: Secondary | ICD-10-CM | POA: Diagnosis not present

## 2022-04-19 DIAGNOSIS — N186 End stage renal disease: Secondary | ICD-10-CM | POA: Diagnosis not present

## 2022-04-19 DIAGNOSIS — G928 Other toxic encephalopathy: Secondary | ICD-10-CM | POA: Diagnosis not present

## 2022-04-19 DIAGNOSIS — J9600 Acute respiratory failure, unspecified whether with hypoxia or hypercapnia: Secondary | ICD-10-CM | POA: Diagnosis not present

## 2022-04-19 DIAGNOSIS — R7989 Other specified abnormal findings of blood chemistry: Secondary | ICD-10-CM | POA: Diagnosis not present

## 2022-04-19 DIAGNOSIS — G9340 Encephalopathy, unspecified: Secondary | ICD-10-CM | POA: Diagnosis not present

## 2022-04-19 DIAGNOSIS — I129 Hypertensive chronic kidney disease with stage 1 through stage 4 chronic kidney disease, or unspecified chronic kidney disease: Secondary | ICD-10-CM | POA: Diagnosis not present

## 2022-04-19 DIAGNOSIS — Z9911 Dependence on respirator [ventilator] status: Secondary | ICD-10-CM | POA: Diagnosis not present

## 2022-04-19 DIAGNOSIS — G039 Meningitis, unspecified: Secondary | ICD-10-CM | POA: Diagnosis not present

## 2022-04-19 DIAGNOSIS — Z940 Kidney transplant status: Secondary | ICD-10-CM | POA: Diagnosis not present

## 2022-04-20 DIAGNOSIS — Z940 Kidney transplant status: Secondary | ICD-10-CM | POA: Diagnosis not present

## 2022-04-20 DIAGNOSIS — G9340 Encephalopathy, unspecified: Secondary | ICD-10-CM | POA: Diagnosis not present

## 2022-04-20 DIAGNOSIS — R9401 Abnormal electroencephalogram [EEG]: Secondary | ICD-10-CM | POA: Diagnosis not present

## 2022-04-20 DIAGNOSIS — R112 Nausea with vomiting, unspecified: Secondary | ICD-10-CM | POA: Diagnosis not present

## 2022-04-20 DIAGNOSIS — N179 Acute kidney failure, unspecified: Secondary | ICD-10-CM | POA: Diagnosis not present

## 2022-04-20 DIAGNOSIS — D849 Immunodeficiency, unspecified: Secondary | ICD-10-CM | POA: Diagnosis not present

## 2022-04-20 DIAGNOSIS — E876 Hypokalemia: Secondary | ICD-10-CM | POA: Diagnosis not present

## 2022-04-20 DIAGNOSIS — R7989 Other specified abnormal findings of blood chemistry: Secondary | ICD-10-CM | POA: Diagnosis not present

## 2022-04-20 DIAGNOSIS — E872 Acidosis, unspecified: Secondary | ICD-10-CM | POA: Diagnosis not present

## 2022-04-20 DIAGNOSIS — T8619 Other complication of kidney transplant: Secondary | ICD-10-CM | POA: Diagnosis not present

## 2022-04-20 DIAGNOSIS — G0490 Encephalitis and encephalomyelitis, unspecified: Secondary | ICD-10-CM | POA: Diagnosis not present

## 2022-04-20 DIAGNOSIS — R6510 Systemic inflammatory response syndrome (SIRS) of non-infectious origin without acute organ dysfunction: Secondary | ICD-10-CM | POA: Diagnosis not present

## 2022-04-20 DIAGNOSIS — Z9911 Dependence on respirator [ventilator] status: Secondary | ICD-10-CM | POA: Diagnosis not present

## 2022-04-20 DIAGNOSIS — R4182 Altered mental status, unspecified: Secondary | ICD-10-CM | POA: Diagnosis not present

## 2022-04-20 DIAGNOSIS — Z8673 Personal history of transient ischemic attack (TIA), and cerebral infarction without residual deficits: Secondary | ICD-10-CM | POA: Diagnosis not present

## 2022-04-20 DIAGNOSIS — I129 Hypertensive chronic kidney disease with stage 1 through stage 4 chronic kidney disease, or unspecified chronic kidney disease: Secondary | ICD-10-CM | POA: Diagnosis not present

## 2022-04-20 DIAGNOSIS — R569 Unspecified convulsions: Secondary | ICD-10-CM | POA: Diagnosis not present

## 2022-04-20 DIAGNOSIS — E870 Hyperosmolality and hypernatremia: Secondary | ICD-10-CM | POA: Diagnosis not present

## 2022-04-20 DIAGNOSIS — N183 Chronic kidney disease, stage 3 unspecified: Secondary | ICD-10-CM | POA: Diagnosis not present

## 2022-04-20 DIAGNOSIS — A9231 West Nile virus infection with encephalitis: Secondary | ICD-10-CM | POA: Diagnosis not present

## 2022-04-21 ENCOUNTER — Encounter (HOSPITAL_COMMUNITY): Payer: Medicare HMO

## 2022-04-21 DIAGNOSIS — R6510 Systemic inflammatory response syndrome (SIRS) of non-infectious origin without acute organ dysfunction: Secondary | ICD-10-CM | POA: Diagnosis not present

## 2022-04-21 DIAGNOSIS — R9401 Abnormal electroencephalogram [EEG]: Secondary | ICD-10-CM | POA: Diagnosis not present

## 2022-04-21 DIAGNOSIS — R509 Fever, unspecified: Secondary | ICD-10-CM | POA: Diagnosis not present

## 2022-04-21 DIAGNOSIS — G9340 Encephalopathy, unspecified: Secondary | ICD-10-CM | POA: Diagnosis not present

## 2022-04-21 DIAGNOSIS — N179 Acute kidney failure, unspecified: Secondary | ICD-10-CM | POA: Diagnosis not present

## 2022-04-21 DIAGNOSIS — A9231 West Nile virus infection with encephalitis: Secondary | ICD-10-CM | POA: Diagnosis not present

## 2022-04-21 DIAGNOSIS — N183 Chronic kidney disease, stage 3 unspecified: Secondary | ICD-10-CM | POA: Diagnosis not present

## 2022-04-21 DIAGNOSIS — R569 Unspecified convulsions: Secondary | ICD-10-CM | POA: Diagnosis not present

## 2022-04-21 DIAGNOSIS — T8619 Other complication of kidney transplant: Secondary | ICD-10-CM | POA: Diagnosis not present

## 2022-04-21 DIAGNOSIS — E876 Hypokalemia: Secondary | ICD-10-CM | POA: Diagnosis not present

## 2022-04-21 DIAGNOSIS — D849 Immunodeficiency, unspecified: Secondary | ICD-10-CM | POA: Diagnosis not present

## 2022-04-21 DIAGNOSIS — R112 Nausea with vomiting, unspecified: Secondary | ICD-10-CM | POA: Diagnosis not present

## 2022-04-21 DIAGNOSIS — R7989 Other specified abnormal findings of blood chemistry: Secondary | ICD-10-CM | POA: Diagnosis not present

## 2022-04-21 DIAGNOSIS — I129 Hypertensive chronic kidney disease with stage 1 through stage 4 chronic kidney disease, or unspecified chronic kidney disease: Secondary | ICD-10-CM | POA: Diagnosis not present

## 2022-04-21 DIAGNOSIS — G0490 Encephalitis and encephalomyelitis, unspecified: Secondary | ICD-10-CM | POA: Diagnosis not present

## 2022-04-21 DIAGNOSIS — E872 Acidosis, unspecified: Secondary | ICD-10-CM | POA: Diagnosis not present

## 2022-04-21 DIAGNOSIS — E870 Hyperosmolality and hypernatremia: Secondary | ICD-10-CM | POA: Diagnosis not present

## 2022-04-21 DIAGNOSIS — R198 Other specified symptoms and signs involving the digestive system and abdomen: Secondary | ICD-10-CM | POA: Diagnosis not present

## 2022-04-21 DIAGNOSIS — Z9911 Dependence on respirator [ventilator] status: Secondary | ICD-10-CM | POA: Diagnosis not present

## 2022-04-21 DIAGNOSIS — Z940 Kidney transplant status: Secondary | ICD-10-CM | POA: Diagnosis not present

## 2022-04-22 ENCOUNTER — Encounter (HOSPITAL_COMMUNITY): Payer: Self-pay

## 2022-04-22 DIAGNOSIS — N183 Chronic kidney disease, stage 3 unspecified: Secondary | ICD-10-CM | POA: Diagnosis not present

## 2022-04-22 DIAGNOSIS — A9231 West Nile virus infection with encephalitis: Secondary | ICD-10-CM | POA: Diagnosis not present

## 2022-04-22 DIAGNOSIS — E876 Hypokalemia: Secondary | ICD-10-CM | POA: Diagnosis not present

## 2022-04-22 DIAGNOSIS — R7989 Other specified abnormal findings of blood chemistry: Secondary | ICD-10-CM | POA: Diagnosis not present

## 2022-04-22 DIAGNOSIS — R9401 Abnormal electroencephalogram [EEG]: Secondary | ICD-10-CM | POA: Diagnosis not present

## 2022-04-22 DIAGNOSIS — R112 Nausea with vomiting, unspecified: Secondary | ICD-10-CM | POA: Diagnosis not present

## 2022-04-22 DIAGNOSIS — T8619 Other complication of kidney transplant: Secondary | ICD-10-CM | POA: Diagnosis not present

## 2022-04-22 DIAGNOSIS — Z9911 Dependence on respirator [ventilator] status: Secondary | ICD-10-CM | POA: Diagnosis not present

## 2022-04-22 DIAGNOSIS — R197 Diarrhea, unspecified: Secondary | ICD-10-CM | POA: Diagnosis not present

## 2022-04-22 DIAGNOSIS — R198 Other specified symptoms and signs involving the digestive system and abdomen: Secondary | ICD-10-CM | POA: Diagnosis not present

## 2022-04-22 DIAGNOSIS — N179 Acute kidney failure, unspecified: Secondary | ICD-10-CM | POA: Diagnosis not present

## 2022-04-22 DIAGNOSIS — Z940 Kidney transplant status: Secondary | ICD-10-CM | POA: Diagnosis not present

## 2022-04-22 DIAGNOSIS — N189 Chronic kidney disease, unspecified: Secondary | ICD-10-CM | POA: Diagnosis not present

## 2022-04-22 DIAGNOSIS — R509 Fever, unspecified: Secondary | ICD-10-CM | POA: Diagnosis not present

## 2022-04-22 DIAGNOSIS — I129 Hypertensive chronic kidney disease with stage 1 through stage 4 chronic kidney disease, or unspecified chronic kidney disease: Secondary | ICD-10-CM | POA: Diagnosis not present

## 2022-04-22 DIAGNOSIS — R569 Unspecified convulsions: Secondary | ICD-10-CM | POA: Diagnosis not present

## 2022-04-22 DIAGNOSIS — G9340 Encephalopathy, unspecified: Secondary | ICD-10-CM | POA: Diagnosis not present

## 2022-04-22 DIAGNOSIS — E872 Acidosis, unspecified: Secondary | ICD-10-CM | POA: Diagnosis not present

## 2022-04-23 DIAGNOSIS — R569 Unspecified convulsions: Secondary | ICD-10-CM | POA: Diagnosis not present

## 2022-04-23 DIAGNOSIS — R112 Nausea with vomiting, unspecified: Secondary | ICD-10-CM | POA: Diagnosis not present

## 2022-04-23 DIAGNOSIS — R7989 Other specified abnormal findings of blood chemistry: Secondary | ICD-10-CM | POA: Diagnosis not present

## 2022-04-23 DIAGNOSIS — E872 Acidosis, unspecified: Secondary | ICD-10-CM | POA: Diagnosis not present

## 2022-04-23 DIAGNOSIS — Z940 Kidney transplant status: Secondary | ICD-10-CM | POA: Diagnosis not present

## 2022-04-23 DIAGNOSIS — R9401 Abnormal electroencephalogram [EEG]: Secondary | ICD-10-CM | POA: Diagnosis not present

## 2022-04-23 DIAGNOSIS — R198 Other specified symptoms and signs involving the digestive system and abdomen: Secondary | ICD-10-CM | POA: Diagnosis not present

## 2022-04-23 DIAGNOSIS — R509 Fever, unspecified: Secondary | ICD-10-CM | POA: Diagnosis not present

## 2022-04-23 DIAGNOSIS — G9340 Encephalopathy, unspecified: Secondary | ICD-10-CM | POA: Diagnosis not present

## 2022-04-23 DIAGNOSIS — N189 Chronic kidney disease, unspecified: Secondary | ICD-10-CM | POA: Diagnosis not present

## 2022-04-23 DIAGNOSIS — G9389 Other specified disorders of brain: Secondary | ICD-10-CM | POA: Diagnosis not present

## 2022-04-23 DIAGNOSIS — R197 Diarrhea, unspecified: Secondary | ICD-10-CM | POA: Diagnosis not present

## 2022-04-23 DIAGNOSIS — I129 Hypertensive chronic kidney disease with stage 1 through stage 4 chronic kidney disease, or unspecified chronic kidney disease: Secondary | ICD-10-CM | POA: Diagnosis not present

## 2022-04-23 DIAGNOSIS — A9231 West Nile virus infection with encephalitis: Secondary | ICD-10-CM | POA: Diagnosis not present

## 2022-04-24 DIAGNOSIS — I129 Hypertensive chronic kidney disease with stage 1 through stage 4 chronic kidney disease, or unspecified chronic kidney disease: Secondary | ICD-10-CM | POA: Diagnosis not present

## 2022-04-24 DIAGNOSIS — N183 Chronic kidney disease, stage 3 unspecified: Secondary | ICD-10-CM | POA: Diagnosis not present

## 2022-04-24 DIAGNOSIS — Z940 Kidney transplant status: Secondary | ICD-10-CM | POA: Diagnosis not present

## 2022-04-24 DIAGNOSIS — E872 Acidosis, unspecified: Secondary | ICD-10-CM | POA: Diagnosis not present

## 2022-04-24 DIAGNOSIS — R197 Diarrhea, unspecified: Secondary | ICD-10-CM | POA: Diagnosis not present

## 2022-04-24 DIAGNOSIS — R569 Unspecified convulsions: Secondary | ICD-10-CM | POA: Diagnosis not present

## 2022-04-24 DIAGNOSIS — Z9911 Dependence on respirator [ventilator] status: Secondary | ICD-10-CM | POA: Diagnosis not present

## 2022-04-24 DIAGNOSIS — A9231 West Nile virus infection with encephalitis: Secondary | ICD-10-CM | POA: Diagnosis not present

## 2022-04-24 DIAGNOSIS — J9811 Atelectasis: Secondary | ICD-10-CM | POA: Diagnosis not present

## 2022-04-24 DIAGNOSIS — R112 Nausea with vomiting, unspecified: Secondary | ICD-10-CM | POA: Diagnosis not present

## 2022-04-24 DIAGNOSIS — R7989 Other specified abnormal findings of blood chemistry: Secondary | ICD-10-CM | POA: Diagnosis not present

## 2022-04-25 DIAGNOSIS — R112 Nausea with vomiting, unspecified: Secondary | ICD-10-CM | POA: Diagnosis not present

## 2022-04-25 DIAGNOSIS — N183 Chronic kidney disease, stage 3 unspecified: Secondary | ICD-10-CM | POA: Diagnosis not present

## 2022-04-25 DIAGNOSIS — I129 Hypertensive chronic kidney disease with stage 1 through stage 4 chronic kidney disease, or unspecified chronic kidney disease: Secondary | ICD-10-CM | POA: Diagnosis not present

## 2022-04-25 DIAGNOSIS — E872 Acidosis, unspecified: Secondary | ICD-10-CM | POA: Diagnosis not present

## 2022-04-25 DIAGNOSIS — A9231 West Nile virus infection with encephalitis: Secondary | ICD-10-CM | POA: Diagnosis not present

## 2022-04-25 DIAGNOSIS — R197 Diarrhea, unspecified: Secondary | ICD-10-CM | POA: Diagnosis not present

## 2022-04-25 DIAGNOSIS — G40909 Epilepsy, unspecified, not intractable, without status epilepticus: Secondary | ICD-10-CM | POA: Diagnosis not present

## 2022-04-25 DIAGNOSIS — R7989 Other specified abnormal findings of blood chemistry: Secondary | ICD-10-CM | POA: Diagnosis not present

## 2022-04-25 DIAGNOSIS — Z940 Kidney transplant status: Secondary | ICD-10-CM | POA: Diagnosis not present

## 2022-04-26 DIAGNOSIS — R509 Fever, unspecified: Secondary | ICD-10-CM | POA: Diagnosis not present

## 2022-04-26 DIAGNOSIS — G0490 Encephalitis and encephalomyelitis, unspecified: Secondary | ICD-10-CM | POA: Diagnosis not present

## 2022-04-26 DIAGNOSIS — I129 Hypertensive chronic kidney disease with stage 1 through stage 4 chronic kidney disease, or unspecified chronic kidney disease: Secondary | ICD-10-CM | POA: Diagnosis not present

## 2022-04-26 DIAGNOSIS — N183 Chronic kidney disease, stage 3 unspecified: Secondary | ICD-10-CM | POA: Diagnosis not present

## 2022-04-26 DIAGNOSIS — G40909 Epilepsy, unspecified, not intractable, without status epilepticus: Secondary | ICD-10-CM | POA: Diagnosis not present

## 2022-04-26 DIAGNOSIS — R7989 Other specified abnormal findings of blood chemistry: Secondary | ICD-10-CM | POA: Diagnosis not present

## 2022-04-26 DIAGNOSIS — Z940 Kidney transplant status: Secondary | ICD-10-CM | POA: Diagnosis not present

## 2022-04-26 DIAGNOSIS — E872 Acidosis, unspecified: Secondary | ICD-10-CM | POA: Diagnosis not present

## 2022-04-26 DIAGNOSIS — R112 Nausea with vomiting, unspecified: Secondary | ICD-10-CM | POA: Diagnosis not present

## 2022-04-26 DIAGNOSIS — G40901 Epilepsy, unspecified, not intractable, with status epilepticus: Secondary | ICD-10-CM | POA: Diagnosis not present

## 2022-04-26 DIAGNOSIS — R197 Diarrhea, unspecified: Secondary | ICD-10-CM | POA: Diagnosis not present

## 2022-04-26 DIAGNOSIS — D849 Immunodeficiency, unspecified: Secondary | ICD-10-CM | POA: Diagnosis not present

## 2022-04-26 DIAGNOSIS — A9231 West Nile virus infection with encephalitis: Secondary | ICD-10-CM | POA: Diagnosis not present

## 2022-04-27 DIAGNOSIS — Z87898 Personal history of other specified conditions: Secondary | ICD-10-CM | POA: Diagnosis not present

## 2022-04-27 DIAGNOSIS — G9340 Encephalopathy, unspecified: Secondary | ICD-10-CM | POA: Diagnosis not present

## 2022-04-27 DIAGNOSIS — D84821 Immunodeficiency due to drugs: Secondary | ICD-10-CM | POA: Diagnosis not present

## 2022-04-27 DIAGNOSIS — D649 Anemia, unspecified: Secondary | ICD-10-CM | POA: Diagnosis not present

## 2022-04-27 DIAGNOSIS — R9401 Abnormal electroencephalogram [EEG]: Secondary | ICD-10-CM | POA: Diagnosis not present

## 2022-04-27 DIAGNOSIS — J329 Chronic sinusitis, unspecified: Secondary | ICD-10-CM | POA: Diagnosis not present

## 2022-04-27 DIAGNOSIS — Z940 Kidney transplant status: Secondary | ICD-10-CM | POA: Diagnosis not present

## 2022-04-27 DIAGNOSIS — Z95828 Presence of other vascular implants and grafts: Secondary | ICD-10-CM | POA: Diagnosis not present

## 2022-04-27 DIAGNOSIS — I6930 Unspecified sequelae of cerebral infarction: Secondary | ICD-10-CM | POA: Diagnosis not present

## 2022-04-27 DIAGNOSIS — R509 Fever, unspecified: Secondary | ICD-10-CM | POA: Diagnosis not present

## 2022-04-27 DIAGNOSIS — A9231 West Nile virus infection with encephalitis: Secondary | ICD-10-CM | POA: Diagnosis not present

## 2022-04-27 DIAGNOSIS — Z7969 Long term (current) use of other immunomodulators and immunosuppressants: Secondary | ICD-10-CM | POA: Diagnosis not present

## 2022-04-27 DIAGNOSIS — G40909 Epilepsy, unspecified, not intractable, without status epilepticus: Secondary | ICD-10-CM | POA: Diagnosis not present

## 2022-04-28 DIAGNOSIS — R011 Cardiac murmur, unspecified: Secondary | ICD-10-CM | POA: Diagnosis not present

## 2022-04-28 DIAGNOSIS — G40909 Epilepsy, unspecified, not intractable, without status epilepticus: Secondary | ICD-10-CM | POA: Diagnosis not present

## 2022-04-28 DIAGNOSIS — J329 Chronic sinusitis, unspecified: Secondary | ICD-10-CM | POA: Diagnosis not present

## 2022-04-28 DIAGNOSIS — N179 Acute kidney failure, unspecified: Secondary | ICD-10-CM | POA: Diagnosis not present

## 2022-04-28 DIAGNOSIS — G9340 Encephalopathy, unspecified: Secondary | ICD-10-CM | POA: Diagnosis not present

## 2022-04-28 DIAGNOSIS — I469 Cardiac arrest, cause unspecified: Secondary | ICD-10-CM | POA: Diagnosis not present

## 2022-04-28 DIAGNOSIS — A9231 West Nile virus infection with encephalitis: Secondary | ICD-10-CM | POA: Diagnosis not present

## 2022-04-28 DIAGNOSIS — R509 Fever, unspecified: Secondary | ICD-10-CM | POA: Diagnosis not present

## 2022-04-28 DIAGNOSIS — R834 Abnormal immunological findings in cerebrospinal fluid: Secondary | ICD-10-CM | POA: Diagnosis not present

## 2022-04-28 DIAGNOSIS — R197 Diarrhea, unspecified: Secondary | ICD-10-CM | POA: Diagnosis not present

## 2022-04-28 DIAGNOSIS — D649 Anemia, unspecified: Secondary | ICD-10-CM | POA: Diagnosis not present

## 2022-04-28 DIAGNOSIS — Z940 Kidney transplant status: Secondary | ICD-10-CM | POA: Diagnosis not present

## 2022-04-28 DIAGNOSIS — R112 Nausea with vomiting, unspecified: Secondary | ICD-10-CM | POA: Diagnosis not present

## 2022-04-28 DIAGNOSIS — E872 Acidosis, unspecified: Secondary | ICD-10-CM | POA: Diagnosis not present

## 2022-04-28 DIAGNOSIS — D72829 Elevated white blood cell count, unspecified: Secondary | ICD-10-CM | POA: Diagnosis not present

## 2022-04-29 ENCOUNTER — Ambulatory Visit: Payer: Medicare Other | Admitting: Podiatry

## 2022-04-29 DIAGNOSIS — N179 Acute kidney failure, unspecified: Secondary | ICD-10-CM | POA: Diagnosis not present

## 2022-04-29 DIAGNOSIS — R9431 Abnormal electrocardiogram [ECG] [EKG]: Secondary | ICD-10-CM | POA: Diagnosis not present

## 2022-04-29 DIAGNOSIS — R509 Fever, unspecified: Secondary | ICD-10-CM | POA: Diagnosis not present

## 2022-04-29 DIAGNOSIS — A9231 West Nile virus infection with encephalitis: Secondary | ICD-10-CM | POA: Diagnosis not present

## 2022-04-29 DIAGNOSIS — R112 Nausea with vomiting, unspecified: Secondary | ICD-10-CM | POA: Diagnosis not present

## 2022-04-29 DIAGNOSIS — R011 Cardiac murmur, unspecified: Secondary | ICD-10-CM | POA: Diagnosis not present

## 2022-04-29 DIAGNOSIS — G40909 Epilepsy, unspecified, not intractable, without status epilepticus: Secondary | ICD-10-CM | POA: Diagnosis not present

## 2022-04-29 DIAGNOSIS — E872 Acidosis, unspecified: Secondary | ICD-10-CM | POA: Diagnosis not present

## 2022-04-29 DIAGNOSIS — D72829 Elevated white blood cell count, unspecified: Secondary | ICD-10-CM | POA: Diagnosis not present

## 2022-04-29 DIAGNOSIS — R197 Diarrhea, unspecified: Secondary | ICD-10-CM | POA: Diagnosis not present

## 2022-04-29 DIAGNOSIS — R834 Abnormal immunological findings in cerebrospinal fluid: Secondary | ICD-10-CM | POA: Diagnosis not present

## 2022-04-29 DIAGNOSIS — Z940 Kidney transplant status: Secondary | ICD-10-CM | POA: Diagnosis not present

## 2022-04-29 DIAGNOSIS — J329 Chronic sinusitis, unspecified: Secondary | ICD-10-CM | POA: Diagnosis not present

## 2022-04-29 DIAGNOSIS — G9340 Encephalopathy, unspecified: Secondary | ICD-10-CM | POA: Diagnosis not present

## 2022-04-29 DIAGNOSIS — I469 Cardiac arrest, cause unspecified: Secondary | ICD-10-CM | POA: Diagnosis not present

## 2022-04-29 DIAGNOSIS — D649 Anemia, unspecified: Secondary | ICD-10-CM | POA: Diagnosis not present

## 2022-04-30 DIAGNOSIS — Z940 Kidney transplant status: Secondary | ICD-10-CM | POA: Diagnosis not present

## 2022-04-30 DIAGNOSIS — E872 Acidosis, unspecified: Secondary | ICD-10-CM | POA: Diagnosis not present

## 2022-04-30 DIAGNOSIS — D649 Anemia, unspecified: Secondary | ICD-10-CM | POA: Diagnosis not present

## 2022-04-30 DIAGNOSIS — R197 Diarrhea, unspecified: Secondary | ICD-10-CM | POA: Diagnosis not present

## 2022-04-30 DIAGNOSIS — Z9911 Dependence on respirator [ventilator] status: Secondary | ICD-10-CM | POA: Diagnosis not present

## 2022-04-30 DIAGNOSIS — G9340 Encephalopathy, unspecified: Secondary | ICD-10-CM | POA: Diagnosis not present

## 2022-04-30 DIAGNOSIS — D72829 Elevated white blood cell count, unspecified: Secondary | ICD-10-CM | POA: Diagnosis not present

## 2022-04-30 DIAGNOSIS — N179 Acute kidney failure, unspecified: Secondary | ICD-10-CM | POA: Diagnosis not present

## 2022-04-30 DIAGNOSIS — J9690 Respiratory failure, unspecified, unspecified whether with hypoxia or hypercapnia: Secondary | ICD-10-CM | POA: Diagnosis not present

## 2022-04-30 DIAGNOSIS — D849 Immunodeficiency, unspecified: Secondary | ICD-10-CM | POA: Diagnosis not present

## 2022-04-30 DIAGNOSIS — R0603 Acute respiratory distress: Secondary | ICD-10-CM | POA: Diagnosis not present

## 2022-04-30 DIAGNOSIS — R1310 Dysphagia, unspecified: Secondary | ICD-10-CM | POA: Diagnosis not present

## 2022-04-30 DIAGNOSIS — J329 Chronic sinusitis, unspecified: Secondary | ICD-10-CM | POA: Diagnosis not present

## 2022-04-30 DIAGNOSIS — G40909 Epilepsy, unspecified, not intractable, without status epilepticus: Secondary | ICD-10-CM | POA: Diagnosis not present

## 2022-04-30 DIAGNOSIS — R834 Abnormal immunological findings in cerebrospinal fluid: Secondary | ICD-10-CM | POA: Diagnosis not present

## 2022-04-30 DIAGNOSIS — J9610 Chronic respiratory failure, unspecified whether with hypoxia or hypercapnia: Secondary | ICD-10-CM | POA: Diagnosis not present

## 2022-04-30 DIAGNOSIS — R011 Cardiac murmur, unspecified: Secondary | ICD-10-CM | POA: Diagnosis not present

## 2022-04-30 DIAGNOSIS — I469 Cardiac arrest, cause unspecified: Secondary | ICD-10-CM | POA: Diagnosis not present

## 2022-04-30 DIAGNOSIS — R509 Fever, unspecified: Secondary | ICD-10-CM | POA: Diagnosis not present

## 2022-04-30 DIAGNOSIS — A9231 West Nile virus infection with encephalitis: Secondary | ICD-10-CM | POA: Diagnosis not present

## 2022-04-30 DIAGNOSIS — Z978 Presence of other specified devices: Secondary | ICD-10-CM | POA: Diagnosis not present

## 2022-04-30 DIAGNOSIS — Z930 Tracheostomy status: Secondary | ICD-10-CM | POA: Diagnosis not present

## 2022-04-30 DIAGNOSIS — R112 Nausea with vomiting, unspecified: Secondary | ICD-10-CM | POA: Diagnosis not present

## 2022-05-01 DIAGNOSIS — I469 Cardiac arrest, cause unspecified: Secondary | ICD-10-CM | POA: Diagnosis not present

## 2022-05-01 DIAGNOSIS — J9383 Other pneumothorax: Secondary | ICD-10-CM | POA: Diagnosis not present

## 2022-05-01 DIAGNOSIS — R197 Diarrhea, unspecified: Secondary | ICD-10-CM | POA: Diagnosis not present

## 2022-05-01 DIAGNOSIS — R509 Fever, unspecified: Secondary | ICD-10-CM | POA: Diagnosis not present

## 2022-05-01 DIAGNOSIS — R112 Nausea with vomiting, unspecified: Secondary | ICD-10-CM | POA: Diagnosis not present

## 2022-05-01 DIAGNOSIS — R578 Other shock: Secondary | ICD-10-CM | POA: Diagnosis not present

## 2022-05-01 DIAGNOSIS — D649 Anemia, unspecified: Secondary | ICD-10-CM | POA: Diagnosis not present

## 2022-05-01 DIAGNOSIS — N179 Acute kidney failure, unspecified: Secondary | ICD-10-CM | POA: Diagnosis not present

## 2022-05-01 DIAGNOSIS — R011 Cardiac murmur, unspecified: Secondary | ICD-10-CM | POA: Diagnosis not present

## 2022-05-01 DIAGNOSIS — J982 Interstitial emphysema: Secondary | ICD-10-CM | POA: Diagnosis not present

## 2022-05-01 DIAGNOSIS — E872 Acidosis, unspecified: Secondary | ICD-10-CM | POA: Diagnosis not present

## 2022-05-01 DIAGNOSIS — R579 Shock, unspecified: Secondary | ICD-10-CM | POA: Diagnosis not present

## 2022-05-01 DIAGNOSIS — Z930 Tracheostomy status: Secondary | ICD-10-CM | POA: Diagnosis not present

## 2022-05-01 DIAGNOSIS — R001 Bradycardia, unspecified: Secondary | ICD-10-CM | POA: Diagnosis not present

## 2022-05-01 DIAGNOSIS — R9431 Abnormal electrocardiogram [ECG] [EKG]: Secondary | ICD-10-CM | POA: Diagnosis not present

## 2022-05-01 DIAGNOSIS — D62 Acute posthemorrhagic anemia: Secondary | ICD-10-CM | POA: Diagnosis not present

## 2022-05-01 DIAGNOSIS — A9231 West Nile virus infection with encephalitis: Secondary | ICD-10-CM | POA: Diagnosis not present

## 2022-05-01 DIAGNOSIS — G9340 Encephalopathy, unspecified: Secondary | ICD-10-CM | POA: Diagnosis not present

## 2022-05-01 DIAGNOSIS — R834 Abnormal immunological findings in cerebrospinal fluid: Secondary | ICD-10-CM | POA: Diagnosis not present

## 2022-05-01 DIAGNOSIS — J939 Pneumothorax, unspecified: Secondary | ICD-10-CM | POA: Diagnosis not present

## 2022-05-01 DIAGNOSIS — Z978 Presence of other specified devices: Secondary | ICD-10-CM | POA: Diagnosis not present

## 2022-05-01 DIAGNOSIS — D72829 Elevated white blood cell count, unspecified: Secondary | ICD-10-CM | POA: Diagnosis not present

## 2022-05-02 DIAGNOSIS — J9601 Acute respiratory failure with hypoxia: Secondary | ICD-10-CM | POA: Diagnosis not present

## 2022-05-02 DIAGNOSIS — I469 Cardiac arrest, cause unspecified: Secondary | ICD-10-CM | POA: Diagnosis not present

## 2022-05-02 DIAGNOSIS — J939 Pneumothorax, unspecified: Secondary | ICD-10-CM | POA: Diagnosis not present

## 2022-05-02 DIAGNOSIS — A9231 West Nile virus infection with encephalitis: Secondary | ICD-10-CM | POA: Diagnosis not present

## 2022-05-03 DIAGNOSIS — A9231 West Nile virus infection with encephalitis: Secondary | ICD-10-CM | POA: Diagnosis not present

## 2022-05-03 DIAGNOSIS — J9383 Other pneumothorax: Secondary | ICD-10-CM | POA: Diagnosis not present

## 2022-05-03 DIAGNOSIS — I469 Cardiac arrest, cause unspecified: Secondary | ICD-10-CM | POA: Diagnosis not present

## 2022-05-03 DIAGNOSIS — J9601 Acute respiratory failure with hypoxia: Secondary | ICD-10-CM | POA: Diagnosis not present

## 2022-05-04 DIAGNOSIS — J939 Pneumothorax, unspecified: Secondary | ICD-10-CM | POA: Diagnosis not present

## 2022-05-04 DIAGNOSIS — Z940 Kidney transplant status: Secondary | ICD-10-CM | POA: Diagnosis not present

## 2022-05-04 DIAGNOSIS — R531 Weakness: Secondary | ICD-10-CM | POA: Diagnosis not present

## 2022-05-04 DIAGNOSIS — Z930 Tracheostomy status: Secondary | ICD-10-CM | POA: Diagnosis not present

## 2022-05-04 DIAGNOSIS — I120 Hypertensive chronic kidney disease with stage 5 chronic kidney disease or end stage renal disease: Secondary | ICD-10-CM | POA: Diagnosis not present

## 2022-05-04 DIAGNOSIS — H189 Unspecified disorder of cornea: Secondary | ICD-10-CM | POA: Diagnosis not present

## 2022-05-04 DIAGNOSIS — N186 End stage renal disease: Secondary | ICD-10-CM | POA: Diagnosis not present

## 2022-05-04 DIAGNOSIS — R0602 Shortness of breath: Secondary | ICD-10-CM | POA: Diagnosis not present

## 2022-05-04 DIAGNOSIS — A9231 West Nile virus infection with encephalitis: Secondary | ICD-10-CM | POA: Diagnosis not present

## 2022-05-04 DIAGNOSIS — J9611 Chronic respiratory failure with hypoxia: Secondary | ICD-10-CM | POA: Diagnosis not present

## 2022-05-05 ENCOUNTER — Encounter (HOSPITAL_COMMUNITY): Payer: Medicare HMO

## 2022-05-05 DIAGNOSIS — Z940 Kidney transplant status: Secondary | ICD-10-CM | POA: Diagnosis not present

## 2022-05-05 DIAGNOSIS — N186 End stage renal disease: Secondary | ICD-10-CM | POA: Diagnosis not present

## 2022-05-05 DIAGNOSIS — J329 Chronic sinusitis, unspecified: Secondary | ICD-10-CM | POA: Diagnosis not present

## 2022-05-05 DIAGNOSIS — J939 Pneumothorax, unspecified: Secondary | ICD-10-CM | POA: Diagnosis not present

## 2022-05-05 DIAGNOSIS — R531 Weakness: Secondary | ICD-10-CM | POA: Diagnosis not present

## 2022-05-05 DIAGNOSIS — J9611 Chronic respiratory failure with hypoxia: Secondary | ICD-10-CM | POA: Diagnosis not present

## 2022-05-05 DIAGNOSIS — I120 Hypertensive chronic kidney disease with stage 5 chronic kidney disease or end stage renal disease: Secondary | ICD-10-CM | POA: Diagnosis not present

## 2022-05-05 DIAGNOSIS — A9231 West Nile virus infection with encephalitis: Secondary | ICD-10-CM | POA: Diagnosis not present

## 2022-05-05 DIAGNOSIS — Z930 Tracheostomy status: Secondary | ICD-10-CM | POA: Diagnosis not present

## 2022-05-06 DIAGNOSIS — I129 Hypertensive chronic kidney disease with stage 1 through stage 4 chronic kidney disease, or unspecified chronic kidney disease: Secondary | ICD-10-CM | POA: Diagnosis not present

## 2022-05-06 DIAGNOSIS — N183 Chronic kidney disease, stage 3 unspecified: Secondary | ICD-10-CM | POA: Diagnosis not present

## 2022-05-06 DIAGNOSIS — Z940 Kidney transplant status: Secondary | ICD-10-CM | POA: Diagnosis not present

## 2022-05-06 DIAGNOSIS — Z66 Do not resuscitate: Secondary | ICD-10-CM | POA: Diagnosis not present

## 2022-05-06 DIAGNOSIS — G40909 Epilepsy, unspecified, not intractable, without status epilepticus: Secondary | ICD-10-CM | POA: Diagnosis not present

## 2022-05-06 DIAGNOSIS — E039 Hypothyroidism, unspecified: Secondary | ICD-10-CM | POA: Diagnosis not present

## 2022-05-06 DIAGNOSIS — R7989 Other specified abnormal findings of blood chemistry: Secondary | ICD-10-CM | POA: Diagnosis not present

## 2022-05-06 DIAGNOSIS — A9231 West Nile virus infection with encephalitis: Secondary | ICD-10-CM | POA: Diagnosis not present

## 2022-05-07 DIAGNOSIS — Z940 Kidney transplant status: Secondary | ICD-10-CM | POA: Diagnosis not present

## 2022-05-07 DIAGNOSIS — I129 Hypertensive chronic kidney disease with stage 1 through stage 4 chronic kidney disease, or unspecified chronic kidney disease: Secondary | ICD-10-CM | POA: Diagnosis not present

## 2022-05-07 DIAGNOSIS — A9231 West Nile virus infection with encephalitis: Secondary | ICD-10-CM | POA: Diagnosis not present

## 2022-05-07 DIAGNOSIS — N183 Chronic kidney disease, stage 3 unspecified: Secondary | ICD-10-CM | POA: Diagnosis not present

## 2022-05-07 DIAGNOSIS — G40909 Epilepsy, unspecified, not intractable, without status epilepticus: Secondary | ICD-10-CM | POA: Diagnosis not present

## 2022-05-07 DIAGNOSIS — E039 Hypothyroidism, unspecified: Secondary | ICD-10-CM | POA: Diagnosis not present

## 2022-05-07 DIAGNOSIS — R7989 Other specified abnormal findings of blood chemistry: Secondary | ICD-10-CM | POA: Diagnosis not present

## 2022-05-08 DIAGNOSIS — A9231 West Nile virus infection with encephalitis: Secondary | ICD-10-CM | POA: Diagnosis not present

## 2022-05-08 DIAGNOSIS — N183 Chronic kidney disease, stage 3 unspecified: Secondary | ICD-10-CM | POA: Diagnosis not present

## 2022-05-08 DIAGNOSIS — E039 Hypothyroidism, unspecified: Secondary | ICD-10-CM | POA: Diagnosis not present

## 2022-05-08 DIAGNOSIS — Z8674 Personal history of sudden cardiac arrest: Secondary | ICD-10-CM | POA: Diagnosis not present

## 2022-05-08 DIAGNOSIS — R112 Nausea with vomiting, unspecified: Secondary | ICD-10-CM | POA: Diagnosis not present

## 2022-05-08 DIAGNOSIS — R197 Diarrhea, unspecified: Secondary | ICD-10-CM | POA: Diagnosis not present

## 2022-05-08 DIAGNOSIS — R0902 Hypoxemia: Secondary | ICD-10-CM | POA: Diagnosis not present

## 2022-05-08 DIAGNOSIS — I129 Hypertensive chronic kidney disease with stage 1 through stage 4 chronic kidney disease, or unspecified chronic kidney disease: Secondary | ICD-10-CM | POA: Diagnosis not present

## 2022-05-08 DIAGNOSIS — G40909 Epilepsy, unspecified, not intractable, without status epilepticus: Secondary | ICD-10-CM | POA: Diagnosis not present

## 2022-05-09 DIAGNOSIS — N183 Chronic kidney disease, stage 3 unspecified: Secondary | ICD-10-CM | POA: Diagnosis not present

## 2022-05-09 DIAGNOSIS — R112 Nausea with vomiting, unspecified: Secondary | ICD-10-CM | POA: Diagnosis not present

## 2022-05-09 DIAGNOSIS — Z8674 Personal history of sudden cardiac arrest: Secondary | ICD-10-CM | POA: Diagnosis not present

## 2022-05-09 DIAGNOSIS — G40909 Epilepsy, unspecified, not intractable, without status epilepticus: Secondary | ICD-10-CM | POA: Diagnosis not present

## 2022-05-09 DIAGNOSIS — E039 Hypothyroidism, unspecified: Secondary | ICD-10-CM | POA: Diagnosis not present

## 2022-05-09 DIAGNOSIS — I129 Hypertensive chronic kidney disease with stage 1 through stage 4 chronic kidney disease, or unspecified chronic kidney disease: Secondary | ICD-10-CM | POA: Diagnosis not present

## 2022-05-09 DIAGNOSIS — R197 Diarrhea, unspecified: Secondary | ICD-10-CM | POA: Diagnosis not present

## 2022-05-09 DIAGNOSIS — A9231 West Nile virus infection with encephalitis: Secondary | ICD-10-CM | POA: Diagnosis not present

## 2022-05-09 DIAGNOSIS — R0902 Hypoxemia: Secondary | ICD-10-CM | POA: Diagnosis not present

## 2022-05-10 DIAGNOSIS — I10 Essential (primary) hypertension: Secondary | ICD-10-CM | POA: Diagnosis not present

## 2022-05-10 DIAGNOSIS — Z8674 Personal history of sudden cardiac arrest: Secondary | ICD-10-CM | POA: Diagnosis not present

## 2022-05-10 DIAGNOSIS — G40909 Epilepsy, unspecified, not intractable, without status epilepticus: Secondary | ICD-10-CM | POA: Diagnosis not present

## 2022-05-10 DIAGNOSIS — Z940 Kidney transplant status: Secondary | ICD-10-CM | POA: Diagnosis not present

## 2022-05-10 DIAGNOSIS — R0902 Hypoxemia: Secondary | ICD-10-CM | POA: Diagnosis not present

## 2022-05-10 DIAGNOSIS — A9231 West Nile virus infection with encephalitis: Secondary | ICD-10-CM | POA: Diagnosis not present

## 2022-05-10 DIAGNOSIS — R197 Diarrhea, unspecified: Secondary | ICD-10-CM | POA: Diagnosis not present

## 2022-05-10 DIAGNOSIS — R112 Nausea with vomiting, unspecified: Secondary | ICD-10-CM | POA: Diagnosis not present

## 2022-05-10 DIAGNOSIS — E039 Hypothyroidism, unspecified: Secondary | ICD-10-CM | POA: Diagnosis not present

## 2022-05-11 DIAGNOSIS — A9231 West Nile virus infection with encephalitis: Secondary | ICD-10-CM | POA: Diagnosis not present

## 2022-05-11 DIAGNOSIS — I10 Essential (primary) hypertension: Secondary | ICD-10-CM | POA: Diagnosis not present

## 2022-05-11 DIAGNOSIS — R197 Diarrhea, unspecified: Secondary | ICD-10-CM | POA: Diagnosis not present

## 2022-05-11 DIAGNOSIS — E039 Hypothyroidism, unspecified: Secondary | ICD-10-CM | POA: Diagnosis not present

## 2022-05-11 DIAGNOSIS — R112 Nausea with vomiting, unspecified: Secondary | ICD-10-CM | POA: Diagnosis not present

## 2022-05-11 DIAGNOSIS — G40909 Epilepsy, unspecified, not intractable, without status epilepticus: Secondary | ICD-10-CM | POA: Diagnosis not present

## 2022-05-11 DIAGNOSIS — R0902 Hypoxemia: Secondary | ICD-10-CM | POA: Diagnosis not present

## 2022-05-11 DIAGNOSIS — Z940 Kidney transplant status: Secondary | ICD-10-CM | POA: Diagnosis not present

## 2022-05-11 DIAGNOSIS — Z8674 Personal history of sudden cardiac arrest: Secondary | ICD-10-CM | POA: Diagnosis not present

## 2022-05-12 DIAGNOSIS — A9231 West Nile virus infection with encephalitis: Secondary | ICD-10-CM | POA: Diagnosis not present

## 2022-05-12 DIAGNOSIS — R569 Unspecified convulsions: Secondary | ICD-10-CM | POA: Diagnosis not present

## 2022-05-12 DIAGNOSIS — R197 Diarrhea, unspecified: Secondary | ICD-10-CM | POA: Diagnosis not present

## 2022-05-12 DIAGNOSIS — R7989 Other specified abnormal findings of blood chemistry: Secondary | ICD-10-CM | POA: Diagnosis not present

## 2022-05-12 DIAGNOSIS — Z940 Kidney transplant status: Secondary | ICD-10-CM | POA: Diagnosis not present

## 2022-05-12 DIAGNOSIS — R0902 Hypoxemia: Secondary | ICD-10-CM | POA: Diagnosis not present

## 2022-05-12 DIAGNOSIS — I10 Essential (primary) hypertension: Secondary | ICD-10-CM | POA: Diagnosis not present

## 2022-05-12 DIAGNOSIS — I469 Cardiac arrest, cause unspecified: Secondary | ICD-10-CM | POA: Diagnosis not present

## 2022-05-12 DIAGNOSIS — R112 Nausea with vomiting, unspecified: Secondary | ICD-10-CM | POA: Diagnosis not present

## 2022-05-13 DIAGNOSIS — A9231 West Nile virus infection with encephalitis: Secondary | ICD-10-CM | POA: Diagnosis not present

## 2022-05-13 DIAGNOSIS — R569 Unspecified convulsions: Secondary | ICD-10-CM | POA: Diagnosis not present

## 2022-05-13 DIAGNOSIS — R7989 Other specified abnormal findings of blood chemistry: Secondary | ICD-10-CM | POA: Diagnosis not present

## 2022-05-13 DIAGNOSIS — I10 Essential (primary) hypertension: Secondary | ICD-10-CM | POA: Diagnosis not present

## 2022-05-13 DIAGNOSIS — R112 Nausea with vomiting, unspecified: Secondary | ICD-10-CM | POA: Diagnosis not present

## 2022-05-13 DIAGNOSIS — Z940 Kidney transplant status: Secondary | ICD-10-CM | POA: Diagnosis not present

## 2022-05-13 DIAGNOSIS — D72829 Elevated white blood cell count, unspecified: Secondary | ICD-10-CM | POA: Diagnosis not present

## 2022-05-13 DIAGNOSIS — I469 Cardiac arrest, cause unspecified: Secondary | ICD-10-CM | POA: Diagnosis not present

## 2022-05-13 DIAGNOSIS — R197 Diarrhea, unspecified: Secondary | ICD-10-CM | POA: Diagnosis not present

## 2022-05-14 ENCOUNTER — Encounter (HOSPITAL_COMMUNITY): Payer: Self-pay | Admitting: Gastroenterology

## 2022-05-14 DIAGNOSIS — Z931 Gastrostomy status: Secondary | ICD-10-CM | POA: Diagnosis not present

## 2022-05-14 DIAGNOSIS — G40909 Epilepsy, unspecified, not intractable, without status epilepticus: Secondary | ICD-10-CM | POA: Diagnosis not present

## 2022-05-14 DIAGNOSIS — Z9911 Dependence on respirator [ventilator] status: Secondary | ICD-10-CM | POA: Diagnosis not present

## 2022-05-14 DIAGNOSIS — E039 Hypothyroidism, unspecified: Secondary | ICD-10-CM | POA: Diagnosis not present

## 2022-05-14 DIAGNOSIS — J9601 Acute respiratory failure with hypoxia: Secondary | ICD-10-CM | POA: Diagnosis not present

## 2022-05-14 DIAGNOSIS — Z930 Tracheostomy status: Secondary | ICD-10-CM | POA: Diagnosis not present

## 2022-05-14 DIAGNOSIS — N183 Chronic kidney disease, stage 3 unspecified: Secondary | ICD-10-CM | POA: Diagnosis not present

## 2022-05-14 DIAGNOSIS — I129 Hypertensive chronic kidney disease with stage 1 through stage 4 chronic kidney disease, or unspecified chronic kidney disease: Secondary | ICD-10-CM | POA: Diagnosis not present

## 2022-05-14 DIAGNOSIS — R7989 Other specified abnormal findings of blood chemistry: Secondary | ICD-10-CM | POA: Diagnosis not present

## 2022-05-14 DIAGNOSIS — Z940 Kidney transplant status: Secondary | ICD-10-CM | POA: Diagnosis not present

## 2022-05-14 DIAGNOSIS — Z87891 Personal history of nicotine dependence: Secondary | ICD-10-CM | POA: Diagnosis not present

## 2022-05-14 DIAGNOSIS — A9231 West Nile virus infection with encephalitis: Secondary | ICD-10-CM | POA: Diagnosis not present

## 2022-05-14 NOTE — Progress Notes (Signed)
Attempted to obtain medical history via telephone, unable to reach at this time. Unable to leave voicemail to return pre surgical testing department's phone call,due to mailbox full.  

## 2022-05-15 DIAGNOSIS — G40909 Epilepsy, unspecified, not intractable, without status epilepticus: Secondary | ICD-10-CM | POA: Diagnosis not present

## 2022-05-15 DIAGNOSIS — R7989 Other specified abnormal findings of blood chemistry: Secondary | ICD-10-CM | POA: Diagnosis not present

## 2022-05-15 DIAGNOSIS — E039 Hypothyroidism, unspecified: Secondary | ICD-10-CM | POA: Diagnosis not present

## 2022-05-15 DIAGNOSIS — N183 Chronic kidney disease, stage 3 unspecified: Secondary | ICD-10-CM | POA: Diagnosis not present

## 2022-05-15 DIAGNOSIS — Z940 Kidney transplant status: Secondary | ICD-10-CM | POA: Diagnosis not present

## 2022-05-15 DIAGNOSIS — A9231 West Nile virus infection with encephalitis: Secondary | ICD-10-CM | POA: Diagnosis not present

## 2022-05-15 DIAGNOSIS — I129 Hypertensive chronic kidney disease with stage 1 through stage 4 chronic kidney disease, or unspecified chronic kidney disease: Secondary | ICD-10-CM | POA: Diagnosis not present

## 2022-05-16 DIAGNOSIS — N183 Chronic kidney disease, stage 3 unspecified: Secondary | ICD-10-CM | POA: Diagnosis not present

## 2022-05-16 DIAGNOSIS — R7989 Other specified abnormal findings of blood chemistry: Secondary | ICD-10-CM | POA: Diagnosis not present

## 2022-05-16 DIAGNOSIS — E039 Hypothyroidism, unspecified: Secondary | ICD-10-CM | POA: Diagnosis not present

## 2022-05-16 DIAGNOSIS — Z940 Kidney transplant status: Secondary | ICD-10-CM | POA: Diagnosis not present

## 2022-05-16 DIAGNOSIS — A9231 West Nile virus infection with encephalitis: Secondary | ICD-10-CM | POA: Diagnosis not present

## 2022-05-16 DIAGNOSIS — G40909 Epilepsy, unspecified, not intractable, without status epilepticus: Secondary | ICD-10-CM | POA: Diagnosis not present

## 2022-05-16 DIAGNOSIS — I129 Hypertensive chronic kidney disease with stage 1 through stage 4 chronic kidney disease, or unspecified chronic kidney disease: Secondary | ICD-10-CM | POA: Diagnosis not present

## 2022-05-17 DIAGNOSIS — N183 Chronic kidney disease, stage 3 unspecified: Secondary | ICD-10-CM | POA: Diagnosis not present

## 2022-05-17 DIAGNOSIS — Z940 Kidney transplant status: Secondary | ICD-10-CM | POA: Diagnosis not present

## 2022-05-17 DIAGNOSIS — I129 Hypertensive chronic kidney disease with stage 1 through stage 4 chronic kidney disease, or unspecified chronic kidney disease: Secondary | ICD-10-CM | POA: Diagnosis not present

## 2022-05-17 DIAGNOSIS — G40909 Epilepsy, unspecified, not intractable, without status epilepticus: Secondary | ICD-10-CM | POA: Diagnosis not present

## 2022-05-17 DIAGNOSIS — R7989 Other specified abnormal findings of blood chemistry: Secondary | ICD-10-CM | POA: Diagnosis not present

## 2022-05-17 DIAGNOSIS — E039 Hypothyroidism, unspecified: Secondary | ICD-10-CM | POA: Diagnosis not present

## 2022-05-17 DIAGNOSIS — A9231 West Nile virus infection with encephalitis: Secondary | ICD-10-CM | POA: Diagnosis not present

## 2022-05-18 DIAGNOSIS — N183 Chronic kidney disease, stage 3 unspecified: Secondary | ICD-10-CM | POA: Diagnosis not present

## 2022-05-18 DIAGNOSIS — A9231 West Nile virus infection with encephalitis: Secondary | ICD-10-CM | POA: Diagnosis not present

## 2022-05-18 DIAGNOSIS — R7989 Other specified abnormal findings of blood chemistry: Secondary | ICD-10-CM | POA: Diagnosis not present

## 2022-05-18 DIAGNOSIS — I129 Hypertensive chronic kidney disease with stage 1 through stage 4 chronic kidney disease, or unspecified chronic kidney disease: Secondary | ICD-10-CM | POA: Diagnosis not present

## 2022-05-18 DIAGNOSIS — E039 Hypothyroidism, unspecified: Secondary | ICD-10-CM | POA: Diagnosis not present

## 2022-05-18 DIAGNOSIS — Z940 Kidney transplant status: Secondary | ICD-10-CM | POA: Diagnosis not present

## 2022-05-18 DIAGNOSIS — G40909 Epilepsy, unspecified, not intractable, without status epilepticus: Secondary | ICD-10-CM | POA: Diagnosis not present

## 2022-05-19 ENCOUNTER — Encounter (HOSPITAL_COMMUNITY): Payer: Medicare HMO

## 2022-05-19 DIAGNOSIS — R7989 Other specified abnormal findings of blood chemistry: Secondary | ICD-10-CM | POA: Diagnosis not present

## 2022-05-19 DIAGNOSIS — R509 Fever, unspecified: Secondary | ICD-10-CM | POA: Diagnosis not present

## 2022-05-19 DIAGNOSIS — N183 Chronic kidney disease, stage 3 unspecified: Secondary | ICD-10-CM | POA: Diagnosis not present

## 2022-05-19 DIAGNOSIS — A9231 West Nile virus infection with encephalitis: Secondary | ICD-10-CM | POA: Diagnosis not present

## 2022-05-19 DIAGNOSIS — D649 Anemia, unspecified: Secondary | ICD-10-CM | POA: Diagnosis not present

## 2022-05-19 DIAGNOSIS — I129 Hypertensive chronic kidney disease with stage 1 through stage 4 chronic kidney disease, or unspecified chronic kidney disease: Secondary | ICD-10-CM | POA: Diagnosis not present

## 2022-05-19 DIAGNOSIS — E039 Hypothyroidism, unspecified: Secondary | ICD-10-CM | POA: Diagnosis not present

## 2022-05-19 DIAGNOSIS — Z940 Kidney transplant status: Secondary | ICD-10-CM | POA: Diagnosis not present

## 2022-05-19 DIAGNOSIS — G40909 Epilepsy, unspecified, not intractable, without status epilepticus: Secondary | ICD-10-CM | POA: Diagnosis not present

## 2022-05-20 DIAGNOSIS — R4020 Unspecified coma: Secondary | ICD-10-CM | POA: Diagnosis not present

## 2022-05-20 DIAGNOSIS — I69365 Other paralytic syndrome following cerebral infarction, bilateral: Secondary | ICD-10-CM | POA: Diagnosis not present

## 2022-05-20 DIAGNOSIS — N183 Chronic kidney disease, stage 3 unspecified: Secondary | ICD-10-CM | POA: Diagnosis not present

## 2022-05-20 DIAGNOSIS — G8250 Quadriplegia, unspecified: Secondary | ICD-10-CM | POA: Diagnosis not present

## 2022-05-20 DIAGNOSIS — F419 Anxiety disorder, unspecified: Secondary | ICD-10-CM | POA: Diagnosis not present

## 2022-05-20 DIAGNOSIS — A9231 West Nile virus infection with encephalitis: Secondary | ICD-10-CM | POA: Diagnosis not present

## 2022-05-20 DIAGNOSIS — R7989 Other specified abnormal findings of blood chemistry: Secondary | ICD-10-CM | POA: Diagnosis not present

## 2022-05-20 DIAGNOSIS — Z515 Encounter for palliative care: Secondary | ICD-10-CM | POA: Diagnosis not present

## 2022-05-20 DIAGNOSIS — E039 Hypothyroidism, unspecified: Secondary | ICD-10-CM | POA: Diagnosis not present

## 2022-05-20 DIAGNOSIS — Z7189 Other specified counseling: Secondary | ICD-10-CM | POA: Diagnosis not present

## 2022-05-20 DIAGNOSIS — Z930 Tracheostomy status: Secondary | ICD-10-CM | POA: Diagnosis not present

## 2022-05-20 DIAGNOSIS — D649 Anemia, unspecified: Secondary | ICD-10-CM | POA: Diagnosis not present

## 2022-05-20 DIAGNOSIS — G40909 Epilepsy, unspecified, not intractable, without status epilepticus: Secondary | ICD-10-CM | POA: Diagnosis not present

## 2022-05-20 DIAGNOSIS — Z66 Do not resuscitate: Secondary | ICD-10-CM | POA: Diagnosis not present

## 2022-05-20 DIAGNOSIS — Z940 Kidney transplant status: Secondary | ICD-10-CM | POA: Diagnosis not present

## 2022-05-20 DIAGNOSIS — I129 Hypertensive chronic kidney disease with stage 1 through stage 4 chronic kidney disease, or unspecified chronic kidney disease: Secondary | ICD-10-CM | POA: Diagnosis not present

## 2022-05-20 DIAGNOSIS — Z9911 Dependence on respirator [ventilator] status: Secondary | ICD-10-CM | POA: Diagnosis not present

## 2022-05-21 ENCOUNTER — Encounter (HOSPITAL_COMMUNITY): Payer: Self-pay | Admitting: Certified Registered"

## 2022-05-21 DIAGNOSIS — N183 Chronic kidney disease, stage 3 unspecified: Secondary | ICD-10-CM | POA: Diagnosis not present

## 2022-05-21 DIAGNOSIS — I129 Hypertensive chronic kidney disease with stage 1 through stage 4 chronic kidney disease, or unspecified chronic kidney disease: Secondary | ICD-10-CM | POA: Diagnosis not present

## 2022-05-21 DIAGNOSIS — N186 End stage renal disease: Secondary | ICD-10-CM | POA: Diagnosis not present

## 2022-05-21 DIAGNOSIS — Z940 Kidney transplant status: Secondary | ICD-10-CM | POA: Diagnosis not present

## 2022-05-21 DIAGNOSIS — J9819 Other pulmonary collapse: Secondary | ICD-10-CM | POA: Diagnosis not present

## 2022-05-21 DIAGNOSIS — A9231 West Nile virus infection with encephalitis: Secondary | ICD-10-CM | POA: Diagnosis not present

## 2022-05-21 DIAGNOSIS — M7989 Other specified soft tissue disorders: Secondary | ICD-10-CM | POA: Diagnosis not present

## 2022-05-21 DIAGNOSIS — Z87891 Personal history of nicotine dependence: Secondary | ICD-10-CM | POA: Diagnosis not present

## 2022-05-21 DIAGNOSIS — G40909 Epilepsy, unspecified, not intractable, without status epilepticus: Secondary | ICD-10-CM | POA: Diagnosis not present

## 2022-05-21 DIAGNOSIS — I120 Hypertensive chronic kidney disease with stage 5 chronic kidney disease or end stage renal disease: Secondary | ICD-10-CM | POA: Diagnosis not present

## 2022-05-21 DIAGNOSIS — E785 Hyperlipidemia, unspecified: Secondary | ICD-10-CM | POA: Diagnosis not present

## 2022-05-21 DIAGNOSIS — R7989 Other specified abnormal findings of blood chemistry: Secondary | ICD-10-CM | POA: Diagnosis not present

## 2022-05-21 DIAGNOSIS — R509 Fever, unspecified: Secondary | ICD-10-CM | POA: Diagnosis not present

## 2022-05-21 DIAGNOSIS — J151 Pneumonia due to Pseudomonas: Secondary | ICD-10-CM | POA: Diagnosis not present

## 2022-05-21 DIAGNOSIS — E039 Hypothyroidism, unspecified: Secondary | ICD-10-CM | POA: Diagnosis not present

## 2022-05-21 DIAGNOSIS — Z992 Dependence on renal dialysis: Secondary | ICD-10-CM | POA: Diagnosis not present

## 2022-05-22 ENCOUNTER — Encounter (HOSPITAL_COMMUNITY): Admission: RE | Payer: Self-pay | Source: Home / Self Care

## 2022-05-22 ENCOUNTER — Ambulatory Visit (HOSPITAL_COMMUNITY): Admission: RE | Admit: 2022-05-22 | Payer: Medicare HMO | Source: Home / Self Care | Admitting: Gastroenterology

## 2022-05-22 DIAGNOSIS — J151 Pneumonia due to Pseudomonas: Secondary | ICD-10-CM | POA: Diagnosis not present

## 2022-05-22 DIAGNOSIS — J95851 Ventilator associated pneumonia: Secondary | ICD-10-CM | POA: Diagnosis not present

## 2022-05-22 DIAGNOSIS — A9231 West Nile virus infection with encephalitis: Secondary | ICD-10-CM | POA: Diagnosis not present

## 2022-05-22 DIAGNOSIS — R7881 Bacteremia: Secondary | ICD-10-CM | POA: Diagnosis not present

## 2022-05-22 DIAGNOSIS — E039 Hypothyroidism, unspecified: Secondary | ICD-10-CM | POA: Diagnosis not present

## 2022-05-22 DIAGNOSIS — N183 Chronic kidney disease, stage 3 unspecified: Secondary | ICD-10-CM | POA: Diagnosis not present

## 2022-05-22 DIAGNOSIS — I129 Hypertensive chronic kidney disease with stage 1 through stage 4 chronic kidney disease, or unspecified chronic kidney disease: Secondary | ICD-10-CM | POA: Diagnosis not present

## 2022-05-22 DIAGNOSIS — G40909 Epilepsy, unspecified, not intractable, without status epilepticus: Secondary | ICD-10-CM | POA: Diagnosis not present

## 2022-05-22 DIAGNOSIS — R7989 Other specified abnormal findings of blood chemistry: Secondary | ICD-10-CM | POA: Diagnosis not present

## 2022-05-22 DIAGNOSIS — F17211 Nicotine dependence, cigarettes, in remission: Secondary | ICD-10-CM | POA: Diagnosis not present

## 2022-05-22 DIAGNOSIS — B965 Pseudomonas (aeruginosa) (mallei) (pseudomallei) as the cause of diseases classified elsewhere: Secondary | ICD-10-CM | POA: Diagnosis not present

## 2022-05-22 DIAGNOSIS — J9819 Other pulmonary collapse: Secondary | ICD-10-CM | POA: Diagnosis not present

## 2022-05-22 DIAGNOSIS — Z940 Kidney transplant status: Secondary | ICD-10-CM | POA: Diagnosis not present

## 2022-05-22 SURGERY — ESOPHAGOGASTRODUODENOSCOPY (EGD) WITH PROPOFOL
Anesthesia: Monitor Anesthesia Care

## 2022-05-22 NOTE — Anesthesia Preprocedure Evaluation (Deleted)
Anesthesia Evaluation    Reviewed: Allergy & Precautions, Patient's Chart, lab work & pertinent test results  Airway        Dental   Pulmonary neg pulmonary ROS          Cardiovascular hypertension, Pt. on medications and Pt. on home beta blockers      Neuro/Psych  Headaches, Seizures -,   Neuromuscular disease CVA    GI/Hepatic Neg liver ROS,GERD  Medicated,,  Endo/Other  Hypothyroidism    Renal/GU ESRF and DialysisRenal disease (MWF)     Musculoskeletal negative musculoskeletal ROS (+)    Abdominal   Peds  Hematology  (+) Blood dyscrasia, anemia   Anesthesia Other Findings   Reproductive/Obstetrics                             Anesthesia Physical Anesthesia Plan  ASA: 3  Anesthesia Plan: MAC   Post-op Pain Management: Minimal or no pain anticipated   Induction: Intravenous  PONV Risk Score and Plan: 2 and TIVA and Treatment may vary due to age or medical condition  Airway Management Planned: Natural Airway and Simple Face Mask  Additional Equipment:   Intra-op Plan:   Post-operative Plan:   Informed Consent:   Plan Discussed with:   Anesthesia Plan Comments:        Anesthesia Quick Evaluation

## 2022-05-23 DIAGNOSIS — A9231 West Nile virus infection with encephalitis: Secondary | ICD-10-CM | POA: Diagnosis not present

## 2022-05-23 DIAGNOSIS — G40909 Epilepsy, unspecified, not intractable, without status epilepticus: Secondary | ICD-10-CM | POA: Diagnosis not present

## 2022-05-23 DIAGNOSIS — E039 Hypothyroidism, unspecified: Secondary | ICD-10-CM | POA: Diagnosis not present

## 2022-05-23 DIAGNOSIS — N183 Chronic kidney disease, stage 3 unspecified: Secondary | ICD-10-CM | POA: Diagnosis not present

## 2022-05-23 DIAGNOSIS — R1084 Generalized abdominal pain: Secondary | ICD-10-CM | POA: Diagnosis not present

## 2022-05-23 DIAGNOSIS — Z940 Kidney transplant status: Secondary | ICD-10-CM | POA: Diagnosis not present

## 2022-05-23 DIAGNOSIS — I129 Hypertensive chronic kidney disease with stage 1 through stage 4 chronic kidney disease, or unspecified chronic kidney disease: Secondary | ICD-10-CM | POA: Diagnosis not present

## 2022-05-23 DIAGNOSIS — J151 Pneumonia due to Pseudomonas: Secondary | ICD-10-CM | POA: Diagnosis not present

## 2022-05-23 DIAGNOSIS — R7989 Other specified abnormal findings of blood chemistry: Secondary | ICD-10-CM | POA: Diagnosis not present

## 2022-05-23 DIAGNOSIS — J9819 Other pulmonary collapse: Secondary | ICD-10-CM | POA: Diagnosis not present

## 2022-05-24 DIAGNOSIS — R1110 Vomiting, unspecified: Secondary | ICD-10-CM | POA: Diagnosis not present

## 2022-05-24 DIAGNOSIS — R1084 Generalized abdominal pain: Secondary | ICD-10-CM | POA: Diagnosis not present

## 2022-05-24 DIAGNOSIS — I129 Hypertensive chronic kidney disease with stage 1 through stage 4 chronic kidney disease, or unspecified chronic kidney disease: Secondary | ICD-10-CM | POA: Diagnosis not present

## 2022-05-24 DIAGNOSIS — R7989 Other specified abnormal findings of blood chemistry: Secondary | ICD-10-CM | POA: Diagnosis not present

## 2022-05-24 DIAGNOSIS — N183 Chronic kidney disease, stage 3 unspecified: Secondary | ICD-10-CM | POA: Diagnosis not present

## 2022-05-24 DIAGNOSIS — Z940 Kidney transplant status: Secondary | ICD-10-CM | POA: Diagnosis not present

## 2022-05-24 DIAGNOSIS — J151 Pneumonia due to Pseudomonas: Secondary | ICD-10-CM | POA: Diagnosis not present

## 2022-05-24 DIAGNOSIS — J9811 Atelectasis: Secondary | ICD-10-CM | POA: Diagnosis not present

## 2022-05-24 DIAGNOSIS — G40909 Epilepsy, unspecified, not intractable, without status epilepticus: Secondary | ICD-10-CM | POA: Diagnosis not present

## 2022-05-24 DIAGNOSIS — J9819 Other pulmonary collapse: Secondary | ICD-10-CM | POA: Diagnosis not present

## 2022-05-24 DIAGNOSIS — A9231 West Nile virus infection with encephalitis: Secondary | ICD-10-CM | POA: Diagnosis not present

## 2022-05-25 DIAGNOSIS — Z87891 Personal history of nicotine dependence: Secondary | ICD-10-CM | POA: Diagnosis not present

## 2022-05-25 DIAGNOSIS — J9819 Other pulmonary collapse: Secondary | ICD-10-CM | POA: Diagnosis not present

## 2022-05-25 DIAGNOSIS — I129 Hypertensive chronic kidney disease with stage 1 through stage 4 chronic kidney disease, or unspecified chronic kidney disease: Secondary | ICD-10-CM | POA: Diagnosis not present

## 2022-05-25 DIAGNOSIS — J151 Pneumonia due to Pseudomonas: Secondary | ICD-10-CM | POA: Diagnosis not present

## 2022-05-25 DIAGNOSIS — Z9911 Dependence on respirator [ventilator] status: Secondary | ICD-10-CM | POA: Diagnosis not present

## 2022-05-25 DIAGNOSIS — J9809 Other diseases of bronchus, not elsewhere classified: Secondary | ICD-10-CM | POA: Diagnosis not present

## 2022-05-25 DIAGNOSIS — R7881 Bacteremia: Secondary | ICD-10-CM | POA: Diagnosis not present

## 2022-05-25 DIAGNOSIS — R1084 Generalized abdominal pain: Secondary | ICD-10-CM | POA: Diagnosis not present

## 2022-05-25 DIAGNOSIS — J9621 Acute and chronic respiratory failure with hypoxia: Secondary | ICD-10-CM | POA: Diagnosis not present

## 2022-05-25 DIAGNOSIS — G40909 Epilepsy, unspecified, not intractable, without status epilepticus: Secondary | ICD-10-CM | POA: Diagnosis not present

## 2022-05-25 DIAGNOSIS — N183 Chronic kidney disease, stage 3 unspecified: Secondary | ICD-10-CM | POA: Diagnosis not present

## 2022-05-25 DIAGNOSIS — A9231 West Nile virus infection with encephalitis: Secondary | ICD-10-CM | POA: Diagnosis not present

## 2022-05-25 DIAGNOSIS — B965 Pseudomonas (aeruginosa) (mallei) (pseudomallei) as the cause of diseases classified elsewhere: Secondary | ICD-10-CM | POA: Diagnosis not present

## 2022-05-25 DIAGNOSIS — Z930 Tracheostomy status: Secondary | ICD-10-CM | POA: Diagnosis not present

## 2022-05-25 DIAGNOSIS — J9811 Atelectasis: Secondary | ICD-10-CM | POA: Diagnosis not present

## 2022-05-25 DIAGNOSIS — J9620 Acute and chronic respiratory failure, unspecified whether with hypoxia or hypercapnia: Secondary | ICD-10-CM | POA: Diagnosis not present

## 2022-05-25 DIAGNOSIS — R1110 Vomiting, unspecified: Secondary | ICD-10-CM | POA: Diagnosis not present

## 2022-05-26 DIAGNOSIS — Z930 Tracheostomy status: Secondary | ICD-10-CM | POA: Diagnosis not present

## 2022-05-26 DIAGNOSIS — R9431 Abnormal electrocardiogram [ECG] [EKG]: Secondary | ICD-10-CM | POA: Diagnosis not present

## 2022-05-26 DIAGNOSIS — Z9911 Dependence on respirator [ventilator] status: Secondary | ICD-10-CM | POA: Diagnosis not present

## 2022-05-26 DIAGNOSIS — I129 Hypertensive chronic kidney disease with stage 1 through stage 4 chronic kidney disease, or unspecified chronic kidney disease: Secondary | ICD-10-CM | POA: Diagnosis not present

## 2022-05-26 DIAGNOSIS — J9811 Atelectasis: Secondary | ICD-10-CM | POA: Diagnosis not present

## 2022-05-26 DIAGNOSIS — J9600 Acute respiratory failure, unspecified whether with hypoxia or hypercapnia: Secondary | ICD-10-CM | POA: Diagnosis not present

## 2022-05-26 DIAGNOSIS — R4182 Altered mental status, unspecified: Secondary | ICD-10-CM | POA: Diagnosis not present

## 2022-05-26 DIAGNOSIS — J9621 Acute and chronic respiratory failure with hypoxia: Secondary | ICD-10-CM | POA: Diagnosis not present

## 2022-05-26 DIAGNOSIS — B965 Pseudomonas (aeruginosa) (mallei) (pseudomallei) as the cause of diseases classified elsewhere: Secondary | ICD-10-CM | POA: Diagnosis not present

## 2022-05-26 DIAGNOSIS — J189 Pneumonia, unspecified organism: Secondary | ICD-10-CM | POA: Diagnosis not present

## 2022-05-26 DIAGNOSIS — R1110 Vomiting, unspecified: Secondary | ICD-10-CM | POA: Diagnosis not present

## 2022-05-26 DIAGNOSIS — Z87891 Personal history of nicotine dependence: Secondary | ICD-10-CM | POA: Diagnosis not present

## 2022-05-26 DIAGNOSIS — J9809 Other diseases of bronchus, not elsewhere classified: Secondary | ICD-10-CM | POA: Diagnosis not present

## 2022-05-26 DIAGNOSIS — A9231 West Nile virus infection with encephalitis: Secondary | ICD-10-CM | POA: Diagnosis not present

## 2022-05-26 DIAGNOSIS — R7881 Bacteremia: Secondary | ICD-10-CM | POA: Diagnosis not present

## 2022-05-26 DIAGNOSIS — J9819 Other pulmonary collapse: Secondary | ICD-10-CM | POA: Diagnosis not present

## 2022-05-26 DIAGNOSIS — G40909 Epilepsy, unspecified, not intractable, without status epilepticus: Secondary | ICD-10-CM | POA: Diagnosis not present

## 2022-05-27 DIAGNOSIS — J9811 Atelectasis: Secondary | ICD-10-CM | POA: Diagnosis not present

## 2022-05-27 DIAGNOSIS — J189 Pneumonia, unspecified organism: Secondary | ICD-10-CM | POA: Diagnosis not present

## 2022-05-27 DIAGNOSIS — A9231 West Nile virus infection with encephalitis: Secondary | ICD-10-CM | POA: Diagnosis not present

## 2022-05-27 DIAGNOSIS — J9621 Acute and chronic respiratory failure with hypoxia: Secondary | ICD-10-CM | POA: Diagnosis not present

## 2022-05-27 DIAGNOSIS — Z9911 Dependence on respirator [ventilator] status: Secondary | ICD-10-CM | POA: Diagnosis not present

## 2022-05-27 DIAGNOSIS — J9809 Other diseases of bronchus, not elsewhere classified: Secondary | ICD-10-CM | POA: Diagnosis not present

## 2022-05-27 DIAGNOSIS — R1110 Vomiting, unspecified: Secondary | ICD-10-CM | POA: Diagnosis not present

## 2022-05-27 DIAGNOSIS — I129 Hypertensive chronic kidney disease with stage 1 through stage 4 chronic kidney disease, or unspecified chronic kidney disease: Secondary | ICD-10-CM | POA: Diagnosis not present

## 2022-05-27 DIAGNOSIS — R7881 Bacteremia: Secondary | ICD-10-CM | POA: Diagnosis not present

## 2022-05-27 DIAGNOSIS — B965 Pseudomonas (aeruginosa) (mallei) (pseudomallei) as the cause of diseases classified elsewhere: Secondary | ICD-10-CM | POA: Diagnosis not present

## 2022-05-27 DIAGNOSIS — J9819 Other pulmonary collapse: Secondary | ICD-10-CM | POA: Diagnosis not present

## 2022-05-27 DIAGNOSIS — Z87891 Personal history of nicotine dependence: Secondary | ICD-10-CM | POA: Diagnosis not present

## 2022-05-27 DIAGNOSIS — Z930 Tracheostomy status: Secondary | ICD-10-CM | POA: Diagnosis not present

## 2022-05-27 DIAGNOSIS — J9600 Acute respiratory failure, unspecified whether with hypoxia or hypercapnia: Secondary | ICD-10-CM | POA: Diagnosis not present

## 2022-05-27 DIAGNOSIS — G40909 Epilepsy, unspecified, not intractable, without status epilepticus: Secondary | ICD-10-CM | POA: Diagnosis not present

## 2022-05-28 DIAGNOSIS — Z9911 Dependence on respirator [ventilator] status: Secondary | ICD-10-CM | POA: Diagnosis not present

## 2022-05-28 DIAGNOSIS — I129 Hypertensive chronic kidney disease with stage 1 through stage 4 chronic kidney disease, or unspecified chronic kidney disease: Secondary | ICD-10-CM | POA: Diagnosis not present

## 2022-05-28 DIAGNOSIS — R1084 Generalized abdominal pain: Secondary | ICD-10-CM | POA: Diagnosis not present

## 2022-05-28 DIAGNOSIS — N183 Chronic kidney disease, stage 3 unspecified: Secondary | ICD-10-CM | POA: Diagnosis not present

## 2022-05-28 DIAGNOSIS — G40909 Epilepsy, unspecified, not intractable, without status epilepticus: Secondary | ICD-10-CM | POA: Diagnosis not present

## 2022-05-28 DIAGNOSIS — J9600 Acute respiratory failure, unspecified whether with hypoxia or hypercapnia: Secondary | ICD-10-CM | POA: Diagnosis not present

## 2022-05-28 DIAGNOSIS — K567 Ileus, unspecified: Secondary | ICD-10-CM | POA: Diagnosis not present

## 2022-05-28 DIAGNOSIS — A498 Other bacterial infections of unspecified site: Secondary | ICD-10-CM | POA: Diagnosis not present

## 2022-05-28 DIAGNOSIS — Z940 Kidney transplant status: Secondary | ICD-10-CM | POA: Diagnosis not present

## 2022-05-28 DIAGNOSIS — A9231 West Nile virus infection with encephalitis: Secondary | ICD-10-CM | POA: Diagnosis not present

## 2022-05-29 DIAGNOSIS — B965 Pseudomonas (aeruginosa) (mallei) (pseudomallei) as the cause of diseases classified elsewhere: Secondary | ICD-10-CM | POA: Diagnosis not present

## 2022-05-29 DIAGNOSIS — K567 Ileus, unspecified: Secondary | ICD-10-CM | POA: Diagnosis not present

## 2022-05-29 DIAGNOSIS — J9811 Atelectasis: Secondary | ICD-10-CM | POA: Diagnosis not present

## 2022-05-29 DIAGNOSIS — J9809 Other diseases of bronchus, not elsewhere classified: Secondary | ICD-10-CM | POA: Diagnosis not present

## 2022-05-29 DIAGNOSIS — Z87891 Personal history of nicotine dependence: Secondary | ICD-10-CM | POA: Diagnosis not present

## 2022-05-29 DIAGNOSIS — N183 Chronic kidney disease, stage 3 unspecified: Secondary | ICD-10-CM | POA: Diagnosis not present

## 2022-05-29 DIAGNOSIS — J9600 Acute respiratory failure, unspecified whether with hypoxia or hypercapnia: Secondary | ICD-10-CM | POA: Diagnosis not present

## 2022-05-29 DIAGNOSIS — Z930 Tracheostomy status: Secondary | ICD-10-CM | POA: Diagnosis not present

## 2022-05-29 DIAGNOSIS — G40909 Epilepsy, unspecified, not intractable, without status epilepticus: Secondary | ICD-10-CM | POA: Diagnosis not present

## 2022-05-29 DIAGNOSIS — I129 Hypertensive chronic kidney disease with stage 1 through stage 4 chronic kidney disease, or unspecified chronic kidney disease: Secondary | ICD-10-CM | POA: Diagnosis not present

## 2022-05-29 DIAGNOSIS — Z940 Kidney transplant status: Secondary | ICD-10-CM | POA: Diagnosis not present

## 2022-05-29 DIAGNOSIS — Z9911 Dependence on respirator [ventilator] status: Secondary | ICD-10-CM | POA: Diagnosis not present

## 2022-05-29 DIAGNOSIS — J9621 Acute and chronic respiratory failure with hypoxia: Secondary | ICD-10-CM | POA: Diagnosis not present

## 2022-05-29 DIAGNOSIS — A498 Other bacterial infections of unspecified site: Secondary | ICD-10-CM | POA: Diagnosis not present

## 2022-05-29 DIAGNOSIS — R7881 Bacteremia: Secondary | ICD-10-CM | POA: Diagnosis not present

## 2022-05-29 DIAGNOSIS — A9231 West Nile virus infection with encephalitis: Secondary | ICD-10-CM | POA: Diagnosis not present

## 2022-05-30 DIAGNOSIS — K567 Ileus, unspecified: Secondary | ICD-10-CM | POA: Diagnosis not present

## 2022-05-30 DIAGNOSIS — I129 Hypertensive chronic kidney disease with stage 1 through stage 4 chronic kidney disease, or unspecified chronic kidney disease: Secondary | ICD-10-CM | POA: Diagnosis not present

## 2022-05-30 DIAGNOSIS — G40909 Epilepsy, unspecified, not intractable, without status epilepticus: Secondary | ICD-10-CM | POA: Diagnosis not present

## 2022-05-30 DIAGNOSIS — A498 Other bacterial infections of unspecified site: Secondary | ICD-10-CM | POA: Diagnosis not present

## 2022-05-30 DIAGNOSIS — N183 Chronic kidney disease, stage 3 unspecified: Secondary | ICD-10-CM | POA: Diagnosis not present

## 2022-05-30 DIAGNOSIS — Z9911 Dependence on respirator [ventilator] status: Secondary | ICD-10-CM | POA: Diagnosis not present

## 2022-05-30 DIAGNOSIS — A9231 West Nile virus infection with encephalitis: Secondary | ICD-10-CM | POA: Diagnosis not present

## 2022-05-30 DIAGNOSIS — Z940 Kidney transplant status: Secondary | ICD-10-CM | POA: Diagnosis not present

## 2022-05-30 DIAGNOSIS — J9600 Acute respiratory failure, unspecified whether with hypoxia or hypercapnia: Secondary | ICD-10-CM | POA: Diagnosis not present

## 2022-05-31 DIAGNOSIS — Z9911 Dependence on respirator [ventilator] status: Secondary | ICD-10-CM | POA: Diagnosis not present

## 2022-05-31 DIAGNOSIS — B965 Pseudomonas (aeruginosa) (mallei) (pseudomallei) as the cause of diseases classified elsewhere: Secondary | ICD-10-CM | POA: Diagnosis not present

## 2022-05-31 DIAGNOSIS — J95851 Ventilator associated pneumonia: Secondary | ICD-10-CM | POA: Diagnosis not present

## 2022-05-31 DIAGNOSIS — I129 Hypertensive chronic kidney disease with stage 1 through stage 4 chronic kidney disease, or unspecified chronic kidney disease: Secondary | ICD-10-CM | POA: Diagnosis not present

## 2022-05-31 DIAGNOSIS — A9231 West Nile virus infection with encephalitis: Secondary | ICD-10-CM | POA: Diagnosis not present

## 2022-05-31 DIAGNOSIS — K567 Ileus, unspecified: Secondary | ICD-10-CM | POA: Diagnosis not present

## 2022-05-31 DIAGNOSIS — Z940 Kidney transplant status: Secondary | ICD-10-CM | POA: Diagnosis not present

## 2022-05-31 DIAGNOSIS — A498 Other bacterial infections of unspecified site: Secondary | ICD-10-CM | POA: Diagnosis not present

## 2022-05-31 DIAGNOSIS — F17211 Nicotine dependence, cigarettes, in remission: Secondary | ICD-10-CM | POA: Diagnosis not present

## 2022-05-31 DIAGNOSIS — G40909 Epilepsy, unspecified, not intractable, without status epilepticus: Secondary | ICD-10-CM | POA: Diagnosis not present

## 2022-05-31 DIAGNOSIS — J9600 Acute respiratory failure, unspecified whether with hypoxia or hypercapnia: Secondary | ICD-10-CM | POA: Diagnosis not present

## 2022-05-31 DIAGNOSIS — N183 Chronic kidney disease, stage 3 unspecified: Secondary | ICD-10-CM | POA: Diagnosis not present

## 2022-05-31 DIAGNOSIS — R7881 Bacteremia: Secondary | ICD-10-CM | POA: Diagnosis not present

## 2022-06-01 DIAGNOSIS — G9340 Encephalopathy, unspecified: Secondary | ICD-10-CM | POA: Diagnosis not present

## 2022-06-01 DIAGNOSIS — J9809 Other diseases of bronchus, not elsewhere classified: Secondary | ICD-10-CM | POA: Diagnosis not present

## 2022-06-01 DIAGNOSIS — R7881 Bacteremia: Secondary | ICD-10-CM | POA: Diagnosis not present

## 2022-06-01 DIAGNOSIS — B965 Pseudomonas (aeruginosa) (mallei) (pseudomallei) as the cause of diseases classified elsewhere: Secondary | ICD-10-CM | POA: Diagnosis not present

## 2022-06-01 DIAGNOSIS — J9600 Acute respiratory failure, unspecified whether with hypoxia or hypercapnia: Secondary | ICD-10-CM | POA: Diagnosis not present

## 2022-06-01 DIAGNOSIS — G40909 Epilepsy, unspecified, not intractable, without status epilepticus: Secondary | ICD-10-CM | POA: Diagnosis not present

## 2022-06-01 DIAGNOSIS — K567 Ileus, unspecified: Secondary | ICD-10-CM | POA: Diagnosis not present

## 2022-06-01 DIAGNOSIS — N183 Chronic kidney disease, stage 3 unspecified: Secondary | ICD-10-CM | POA: Diagnosis not present

## 2022-06-01 DIAGNOSIS — A9231 West Nile virus infection with encephalitis: Secondary | ICD-10-CM | POA: Diagnosis not present

## 2022-06-01 DIAGNOSIS — Z9911 Dependence on respirator [ventilator] status: Secondary | ICD-10-CM | POA: Diagnosis not present

## 2022-06-01 DIAGNOSIS — J9811 Atelectasis: Secondary | ICD-10-CM | POA: Diagnosis not present

## 2022-06-01 DIAGNOSIS — Z930 Tracheostomy status: Secondary | ICD-10-CM | POA: Diagnosis not present

## 2022-06-01 DIAGNOSIS — Z940 Kidney transplant status: Secondary | ICD-10-CM | POA: Diagnosis not present

## 2022-06-01 DIAGNOSIS — J9621 Acute and chronic respiratory failure with hypoxia: Secondary | ICD-10-CM | POA: Diagnosis not present

## 2022-06-01 DIAGNOSIS — Z87891 Personal history of nicotine dependence: Secondary | ICD-10-CM | POA: Diagnosis not present

## 2022-06-01 DIAGNOSIS — I129 Hypertensive chronic kidney disease with stage 1 through stage 4 chronic kidney disease, or unspecified chronic kidney disease: Secondary | ICD-10-CM | POA: Diagnosis not present

## 2022-06-01 DIAGNOSIS — A498 Other bacterial infections of unspecified site: Secondary | ICD-10-CM | POA: Diagnosis not present

## 2022-06-02 DIAGNOSIS — Z940 Kidney transplant status: Secondary | ICD-10-CM | POA: Diagnosis not present

## 2022-06-02 DIAGNOSIS — K567 Ileus, unspecified: Secondary | ICD-10-CM | POA: Diagnosis not present

## 2022-06-02 DIAGNOSIS — J9600 Acute respiratory failure, unspecified whether with hypoxia or hypercapnia: Secondary | ICD-10-CM | POA: Diagnosis not present

## 2022-06-02 DIAGNOSIS — A9231 West Nile virus infection with encephalitis: Secondary | ICD-10-CM | POA: Diagnosis not present

## 2022-06-02 DIAGNOSIS — G40909 Epilepsy, unspecified, not intractable, without status epilepticus: Secondary | ICD-10-CM | POA: Diagnosis not present

## 2022-06-02 DIAGNOSIS — A498 Other bacterial infections of unspecified site: Secondary | ICD-10-CM | POA: Diagnosis not present

## 2022-06-02 DIAGNOSIS — Z9911 Dependence on respirator [ventilator] status: Secondary | ICD-10-CM | POA: Diagnosis not present

## 2022-06-02 DIAGNOSIS — I129 Hypertensive chronic kidney disease with stage 1 through stage 4 chronic kidney disease, or unspecified chronic kidney disease: Secondary | ICD-10-CM | POA: Diagnosis not present

## 2022-06-02 DIAGNOSIS — N183 Chronic kidney disease, stage 3 unspecified: Secondary | ICD-10-CM | POA: Diagnosis not present

## 2022-06-03 DIAGNOSIS — A9231 West Nile virus infection with encephalitis: Secondary | ICD-10-CM | POA: Diagnosis not present

## 2022-06-03 DIAGNOSIS — A498 Other bacterial infections of unspecified site: Secondary | ICD-10-CM | POA: Diagnosis not present

## 2022-06-03 DIAGNOSIS — I129 Hypertensive chronic kidney disease with stage 1 through stage 4 chronic kidney disease, or unspecified chronic kidney disease: Secondary | ICD-10-CM | POA: Diagnosis not present

## 2022-06-03 DIAGNOSIS — Z930 Tracheostomy status: Secondary | ICD-10-CM | POA: Diagnosis not present

## 2022-06-03 DIAGNOSIS — G40909 Epilepsy, unspecified, not intractable, without status epilepticus: Secondary | ICD-10-CM | POA: Diagnosis not present

## 2022-06-03 DIAGNOSIS — J9600 Acute respiratory failure, unspecified whether with hypoxia or hypercapnia: Secondary | ICD-10-CM | POA: Diagnosis not present

## 2022-06-03 DIAGNOSIS — Z9911 Dependence on respirator [ventilator] status: Secondary | ICD-10-CM | POA: Diagnosis not present

## 2022-06-03 DIAGNOSIS — K567 Ileus, unspecified: Secondary | ICD-10-CM | POA: Diagnosis not present

## 2022-06-03 DIAGNOSIS — N183 Chronic kidney disease, stage 3 unspecified: Secondary | ICD-10-CM | POA: Diagnosis not present

## 2022-06-04 DIAGNOSIS — J9600 Acute respiratory failure, unspecified whether with hypoxia or hypercapnia: Secondary | ICD-10-CM | POA: Diagnosis not present

## 2022-06-04 DIAGNOSIS — I129 Hypertensive chronic kidney disease with stage 1 through stage 4 chronic kidney disease, or unspecified chronic kidney disease: Secondary | ICD-10-CM | POA: Diagnosis not present

## 2022-06-04 DIAGNOSIS — G40909 Epilepsy, unspecified, not intractable, without status epilepticus: Secondary | ICD-10-CM | POA: Diagnosis not present

## 2022-06-04 DIAGNOSIS — Z930 Tracheostomy status: Secondary | ICD-10-CM | POA: Diagnosis not present

## 2022-06-04 DIAGNOSIS — A498 Other bacterial infections of unspecified site: Secondary | ICD-10-CM | POA: Diagnosis not present

## 2022-06-04 DIAGNOSIS — Z9911 Dependence on respirator [ventilator] status: Secondary | ICD-10-CM | POA: Diagnosis not present

## 2022-06-04 DIAGNOSIS — K567 Ileus, unspecified: Secondary | ICD-10-CM | POA: Diagnosis not present

## 2022-06-04 DIAGNOSIS — A9231 West Nile virus infection with encephalitis: Secondary | ICD-10-CM | POA: Diagnosis not present

## 2022-06-04 DIAGNOSIS — N183 Chronic kidney disease, stage 3 unspecified: Secondary | ICD-10-CM | POA: Diagnosis not present

## 2022-06-05 DIAGNOSIS — N183 Chronic kidney disease, stage 3 unspecified: Secondary | ICD-10-CM | POA: Diagnosis not present

## 2022-06-05 DIAGNOSIS — Z930 Tracheostomy status: Secondary | ICD-10-CM | POA: Diagnosis not present

## 2022-06-05 DIAGNOSIS — K567 Ileus, unspecified: Secondary | ICD-10-CM | POA: Diagnosis not present

## 2022-06-05 DIAGNOSIS — A498 Other bacterial infections of unspecified site: Secondary | ICD-10-CM | POA: Diagnosis not present

## 2022-06-05 DIAGNOSIS — J9600 Acute respiratory failure, unspecified whether with hypoxia or hypercapnia: Secondary | ICD-10-CM | POA: Diagnosis not present

## 2022-06-05 DIAGNOSIS — A9231 West Nile virus infection with encephalitis: Secondary | ICD-10-CM | POA: Diagnosis not present

## 2022-06-05 DIAGNOSIS — Z9911 Dependence on respirator [ventilator] status: Secondary | ICD-10-CM | POA: Diagnosis not present

## 2022-06-05 DIAGNOSIS — I129 Hypertensive chronic kidney disease with stage 1 through stage 4 chronic kidney disease, or unspecified chronic kidney disease: Secondary | ICD-10-CM | POA: Diagnosis not present

## 2022-06-05 DIAGNOSIS — G40909 Epilepsy, unspecified, not intractable, without status epilepticus: Secondary | ICD-10-CM | POA: Diagnosis not present

## 2022-06-06 DIAGNOSIS — A9231 West Nile virus infection with encephalitis: Secondary | ICD-10-CM | POA: Diagnosis not present

## 2022-06-06 DIAGNOSIS — Z930 Tracheostomy status: Secondary | ICD-10-CM | POA: Diagnosis not present

## 2022-06-06 DIAGNOSIS — J9600 Acute respiratory failure, unspecified whether with hypoxia or hypercapnia: Secondary | ICD-10-CM | POA: Diagnosis not present

## 2022-06-06 DIAGNOSIS — A498 Other bacterial infections of unspecified site: Secondary | ICD-10-CM | POA: Diagnosis not present

## 2022-06-06 DIAGNOSIS — Z9911 Dependence on respirator [ventilator] status: Secondary | ICD-10-CM | POA: Diagnosis not present

## 2022-06-06 DIAGNOSIS — I129 Hypertensive chronic kidney disease with stage 1 through stage 4 chronic kidney disease, or unspecified chronic kidney disease: Secondary | ICD-10-CM | POA: Diagnosis not present

## 2022-06-06 DIAGNOSIS — N183 Chronic kidney disease, stage 3 unspecified: Secondary | ICD-10-CM | POA: Diagnosis not present

## 2022-06-06 DIAGNOSIS — K567 Ileus, unspecified: Secondary | ICD-10-CM | POA: Diagnosis not present

## 2022-06-06 DIAGNOSIS — G40909 Epilepsy, unspecified, not intractable, without status epilepticus: Secondary | ICD-10-CM | POA: Diagnosis not present

## 2022-06-07 DIAGNOSIS — A9231 West Nile virus infection with encephalitis: Secondary | ICD-10-CM | POA: Diagnosis not present

## 2022-06-07 DIAGNOSIS — Z9911 Dependence on respirator [ventilator] status: Secondary | ICD-10-CM | POA: Diagnosis not present

## 2022-06-07 DIAGNOSIS — J9600 Acute respiratory failure, unspecified whether with hypoxia or hypercapnia: Secondary | ICD-10-CM | POA: Diagnosis not present

## 2022-06-07 DIAGNOSIS — N183 Chronic kidney disease, stage 3 unspecified: Secondary | ICD-10-CM | POA: Diagnosis not present

## 2022-06-07 DIAGNOSIS — I129 Hypertensive chronic kidney disease with stage 1 through stage 4 chronic kidney disease, or unspecified chronic kidney disease: Secondary | ICD-10-CM | POA: Diagnosis not present

## 2022-06-07 DIAGNOSIS — G40909 Epilepsy, unspecified, not intractable, without status epilepticus: Secondary | ICD-10-CM | POA: Diagnosis not present

## 2022-06-07 DIAGNOSIS — K567 Ileus, unspecified: Secondary | ICD-10-CM | POA: Diagnosis not present

## 2022-06-07 DIAGNOSIS — A498 Other bacterial infections of unspecified site: Secondary | ICD-10-CM | POA: Diagnosis not present

## 2022-06-07 DIAGNOSIS — Z930 Tracheostomy status: Secondary | ICD-10-CM | POA: Diagnosis not present

## 2022-06-08 DIAGNOSIS — N183 Chronic kidney disease, stage 3 unspecified: Secondary | ICD-10-CM | POA: Diagnosis not present

## 2022-06-08 DIAGNOSIS — A9231 West Nile virus infection with encephalitis: Secondary | ICD-10-CM | POA: Diagnosis not present

## 2022-06-08 DIAGNOSIS — J9600 Acute respiratory failure, unspecified whether with hypoxia or hypercapnia: Secondary | ICD-10-CM | POA: Diagnosis not present

## 2022-06-08 DIAGNOSIS — I129 Hypertensive chronic kidney disease with stage 1 through stage 4 chronic kidney disease, or unspecified chronic kidney disease: Secondary | ICD-10-CM | POA: Diagnosis not present

## 2022-06-08 DIAGNOSIS — G40909 Epilepsy, unspecified, not intractable, without status epilepticus: Secondary | ICD-10-CM | POA: Diagnosis not present

## 2022-06-08 DIAGNOSIS — Z9911 Dependence on respirator [ventilator] status: Secondary | ICD-10-CM | POA: Diagnosis not present

## 2022-06-08 DIAGNOSIS — A498 Other bacterial infections of unspecified site: Secondary | ICD-10-CM | POA: Diagnosis not present

## 2022-06-08 DIAGNOSIS — Z940 Kidney transplant status: Secondary | ICD-10-CM | POA: Diagnosis not present

## 2022-06-08 DIAGNOSIS — Z930 Tracheostomy status: Secondary | ICD-10-CM | POA: Diagnosis not present

## 2022-06-09 DIAGNOSIS — Z940 Kidney transplant status: Secondary | ICD-10-CM | POA: Diagnosis not present

## 2022-06-09 DIAGNOSIS — G40909 Epilepsy, unspecified, not intractable, without status epilepticus: Secondary | ICD-10-CM | POA: Diagnosis not present

## 2022-06-09 DIAGNOSIS — Z930 Tracheostomy status: Secondary | ICD-10-CM | POA: Diagnosis not present

## 2022-06-09 DIAGNOSIS — I129 Hypertensive chronic kidney disease with stage 1 through stage 4 chronic kidney disease, or unspecified chronic kidney disease: Secondary | ICD-10-CM | POA: Diagnosis not present

## 2022-06-09 DIAGNOSIS — Z9911 Dependence on respirator [ventilator] status: Secondary | ICD-10-CM | POA: Diagnosis not present

## 2022-06-09 DIAGNOSIS — J9600 Acute respiratory failure, unspecified whether with hypoxia or hypercapnia: Secondary | ICD-10-CM | POA: Diagnosis not present

## 2022-06-09 DIAGNOSIS — N183 Chronic kidney disease, stage 3 unspecified: Secondary | ICD-10-CM | POA: Diagnosis not present

## 2022-06-09 DIAGNOSIS — A9231 West Nile virus infection with encephalitis: Secondary | ICD-10-CM | POA: Diagnosis not present

## 2022-06-09 DIAGNOSIS — A498 Other bacterial infections of unspecified site: Secondary | ICD-10-CM | POA: Diagnosis not present

## 2022-06-10 DIAGNOSIS — Z940 Kidney transplant status: Secondary | ICD-10-CM | POA: Diagnosis not present

## 2022-06-10 DIAGNOSIS — Z930 Tracheostomy status: Secondary | ICD-10-CM | POA: Diagnosis not present

## 2022-06-10 DIAGNOSIS — J9600 Acute respiratory failure, unspecified whether with hypoxia or hypercapnia: Secondary | ICD-10-CM | POA: Diagnosis not present

## 2022-06-10 DIAGNOSIS — A9231 West Nile virus infection with encephalitis: Secondary | ICD-10-CM | POA: Diagnosis not present

## 2022-06-10 DIAGNOSIS — N183 Chronic kidney disease, stage 3 unspecified: Secondary | ICD-10-CM | POA: Diagnosis not present

## 2022-06-10 DIAGNOSIS — Z9911 Dependence on respirator [ventilator] status: Secondary | ICD-10-CM | POA: Diagnosis not present

## 2022-06-10 DIAGNOSIS — G40909 Epilepsy, unspecified, not intractable, without status epilepticus: Secondary | ICD-10-CM | POA: Diagnosis not present

## 2022-06-10 DIAGNOSIS — A498 Other bacterial infections of unspecified site: Secondary | ICD-10-CM | POA: Diagnosis not present

## 2022-06-10 DIAGNOSIS — I129 Hypertensive chronic kidney disease with stage 1 through stage 4 chronic kidney disease, or unspecified chronic kidney disease: Secondary | ICD-10-CM | POA: Diagnosis not present

## 2022-06-11 DIAGNOSIS — Z940 Kidney transplant status: Secondary | ICD-10-CM | POA: Diagnosis not present

## 2022-06-11 DIAGNOSIS — N183 Chronic kidney disease, stage 3 unspecified: Secondary | ICD-10-CM | POA: Diagnosis not present

## 2022-06-11 DIAGNOSIS — Z9911 Dependence on respirator [ventilator] status: Secondary | ICD-10-CM | POA: Diagnosis not present

## 2022-06-11 DIAGNOSIS — A9231 West Nile virus infection with encephalitis: Secondary | ICD-10-CM | POA: Diagnosis not present

## 2022-06-11 DIAGNOSIS — A498 Other bacterial infections of unspecified site: Secondary | ICD-10-CM | POA: Diagnosis not present

## 2022-06-11 DIAGNOSIS — J9600 Acute respiratory failure, unspecified whether with hypoxia or hypercapnia: Secondary | ICD-10-CM | POA: Diagnosis not present

## 2022-06-11 DIAGNOSIS — Z930 Tracheostomy status: Secondary | ICD-10-CM | POA: Diagnosis not present

## 2022-06-11 DIAGNOSIS — I129 Hypertensive chronic kidney disease with stage 1 through stage 4 chronic kidney disease, or unspecified chronic kidney disease: Secondary | ICD-10-CM | POA: Diagnosis not present

## 2022-06-11 DIAGNOSIS — G40909 Epilepsy, unspecified, not intractable, without status epilepticus: Secondary | ICD-10-CM | POA: Diagnosis not present

## 2022-06-12 DIAGNOSIS — A9231 West Nile virus infection with encephalitis: Secondary | ICD-10-CM | POA: Diagnosis not present

## 2022-06-12 DIAGNOSIS — A498 Other bacterial infections of unspecified site: Secondary | ICD-10-CM | POA: Diagnosis not present

## 2022-06-12 DIAGNOSIS — J9600 Acute respiratory failure, unspecified whether with hypoxia or hypercapnia: Secondary | ICD-10-CM | POA: Diagnosis not present

## 2022-06-12 DIAGNOSIS — I129 Hypertensive chronic kidney disease with stage 1 through stage 4 chronic kidney disease, or unspecified chronic kidney disease: Secondary | ICD-10-CM | POA: Diagnosis not present

## 2022-06-12 DIAGNOSIS — Z9911 Dependence on respirator [ventilator] status: Secondary | ICD-10-CM | POA: Diagnosis not present

## 2022-06-12 DIAGNOSIS — N183 Chronic kidney disease, stage 3 unspecified: Secondary | ICD-10-CM | POA: Diagnosis not present

## 2022-06-12 DIAGNOSIS — G40909 Epilepsy, unspecified, not intractable, without status epilepticus: Secondary | ICD-10-CM | POA: Diagnosis not present

## 2022-06-12 DIAGNOSIS — Z940 Kidney transplant status: Secondary | ICD-10-CM | POA: Diagnosis not present

## 2022-06-12 DIAGNOSIS — Z930 Tracheostomy status: Secondary | ICD-10-CM | POA: Diagnosis not present

## 2022-07-07 DIAGNOSIS — D631 Anemia in chronic kidney disease: Secondary | ICD-10-CM | POA: Diagnosis not present

## 2022-07-07 DIAGNOSIS — N186 End stage renal disease: Secondary | ICD-10-CM | POA: Diagnosis not present

## 2022-07-07 DIAGNOSIS — Z431 Encounter for attention to gastrostomy: Secondary | ICD-10-CM | POA: Diagnosis not present

## 2022-07-07 DIAGNOSIS — R1310 Dysphagia, unspecified: Secondary | ICD-10-CM | POA: Diagnosis not present

## 2022-07-07 DIAGNOSIS — I739 Peripheral vascular disease, unspecified: Secondary | ICD-10-CM | POA: Diagnosis not present

## 2022-07-07 DIAGNOSIS — N041 Nephrotic syndrome with focal and segmental glomerular lesions: Secondary | ICD-10-CM | POA: Diagnosis not present

## 2022-07-07 DIAGNOSIS — I120 Hypertensive chronic kidney disease with stage 5 chronic kidney disease or end stage renal disease: Secondary | ICD-10-CM | POA: Diagnosis not present

## 2022-07-07 DIAGNOSIS — Z430 Encounter for attention to tracheostomy: Secondary | ICD-10-CM | POA: Diagnosis not present

## 2022-07-07 DIAGNOSIS — A9231 West Nile virus infection with encephalitis: Secondary | ICD-10-CM | POA: Diagnosis not present

## 2022-07-13 DIAGNOSIS — N041 Nephrotic syndrome with focal and segmental glomerular lesions: Secondary | ICD-10-CM | POA: Diagnosis not present

## 2022-07-13 DIAGNOSIS — A9231 West Nile virus infection with encephalitis: Secondary | ICD-10-CM | POA: Diagnosis not present

## 2022-07-13 DIAGNOSIS — R1310 Dysphagia, unspecified: Secondary | ICD-10-CM | POA: Diagnosis not present

## 2022-07-13 DIAGNOSIS — I739 Peripheral vascular disease, unspecified: Secondary | ICD-10-CM | POA: Diagnosis not present

## 2022-07-13 DIAGNOSIS — D631 Anemia in chronic kidney disease: Secondary | ICD-10-CM | POA: Diagnosis not present

## 2022-07-13 DIAGNOSIS — I120 Hypertensive chronic kidney disease with stage 5 chronic kidney disease or end stage renal disease: Secondary | ICD-10-CM | POA: Diagnosis not present

## 2022-07-13 DIAGNOSIS — Z430 Encounter for attention to tracheostomy: Secondary | ICD-10-CM | POA: Diagnosis not present

## 2022-07-13 DIAGNOSIS — N186 End stage renal disease: Secondary | ICD-10-CM | POA: Diagnosis not present

## 2022-07-13 DIAGNOSIS — Z431 Encounter for attention to gastrostomy: Secondary | ICD-10-CM | POA: Diagnosis not present

## 2022-07-16 DIAGNOSIS — Z7969 Long term (current) use of other immunomodulators and immunosuppressants: Secondary | ICD-10-CM | POA: Diagnosis not present

## 2022-07-16 DIAGNOSIS — E039 Hypothyroidism, unspecified: Secondary | ICD-10-CM | POA: Diagnosis not present

## 2022-07-16 DIAGNOSIS — R042 Hemoptysis: Secondary | ICD-10-CM | POA: Diagnosis not present

## 2022-07-16 DIAGNOSIS — E870 Hyperosmolality and hypernatremia: Secondary | ICD-10-CM | POA: Diagnosis not present

## 2022-07-16 DIAGNOSIS — Z430 Encounter for attention to tracheostomy: Secondary | ICD-10-CM | POA: Diagnosis not present

## 2022-07-16 DIAGNOSIS — E876 Hypokalemia: Secondary | ICD-10-CM | POA: Diagnosis not present

## 2022-07-16 DIAGNOSIS — Z8673 Personal history of transient ischemic attack (TIA), and cerebral infarction without residual deficits: Secondary | ICD-10-CM | POA: Diagnosis not present

## 2022-07-16 DIAGNOSIS — R404 Transient alteration of awareness: Secondary | ICD-10-CM | POA: Diagnosis not present

## 2022-07-16 DIAGNOSIS — I129 Hypertensive chronic kidney disease with stage 1 through stage 4 chronic kidney disease, or unspecified chronic kidney disease: Secondary | ICD-10-CM | POA: Diagnosis not present

## 2022-07-16 DIAGNOSIS — A9231 West Nile virus infection with encephalitis: Secondary | ICD-10-CM | POA: Diagnosis not present

## 2022-07-16 DIAGNOSIS — K567 Ileus, unspecified: Secondary | ICD-10-CM | POA: Diagnosis not present

## 2022-07-16 DIAGNOSIS — R9431 Abnormal electrocardiogram [ECG] [EKG]: Secondary | ICD-10-CM | POA: Diagnosis not present

## 2022-07-16 DIAGNOSIS — D72829 Elevated white blood cell count, unspecified: Secondary | ICD-10-CM | POA: Diagnosis not present

## 2022-07-16 DIAGNOSIS — J9811 Atelectasis: Secondary | ICD-10-CM | POA: Diagnosis not present

## 2022-07-16 DIAGNOSIS — I959 Hypotension, unspecified: Secondary | ICD-10-CM | POA: Diagnosis not present

## 2022-07-16 DIAGNOSIS — L89159 Pressure ulcer of sacral region, unspecified stage: Secondary | ICD-10-CM | POA: Diagnosis not present

## 2022-07-16 DIAGNOSIS — Z79621 Long term (current) use of calcineurin inhibitor: Secondary | ICD-10-CM | POA: Diagnosis not present

## 2022-07-16 DIAGNOSIS — Z794 Long term (current) use of insulin: Secondary | ICD-10-CM | POA: Diagnosis not present

## 2022-07-16 DIAGNOSIS — I639 Cerebral infarction, unspecified: Secondary | ICD-10-CM | POA: Diagnosis not present

## 2022-07-16 DIAGNOSIS — Z7952 Long term (current) use of systemic steroids: Secondary | ICD-10-CM | POA: Diagnosis not present

## 2022-07-16 DIAGNOSIS — Z7982 Long term (current) use of aspirin: Secondary | ICD-10-CM | POA: Diagnosis not present

## 2022-07-16 DIAGNOSIS — Z931 Gastrostomy status: Secondary | ICD-10-CM | POA: Diagnosis not present

## 2022-07-16 DIAGNOSIS — J948 Other specified pleural conditions: Secondary | ICD-10-CM | POA: Diagnosis not present

## 2022-07-16 DIAGNOSIS — N189 Chronic kidney disease, unspecified: Secondary | ICD-10-CM | POA: Diagnosis not present

## 2022-07-16 DIAGNOSIS — R5383 Other fatigue: Secondary | ICD-10-CM | POA: Diagnosis not present

## 2022-07-16 DIAGNOSIS — B952 Enterococcus as the cause of diseases classified elsewhere: Secondary | ICD-10-CM | POA: Diagnosis not present

## 2022-07-16 DIAGNOSIS — Z940 Kidney transplant status: Secondary | ICD-10-CM | POA: Diagnosis not present

## 2022-07-16 DIAGNOSIS — N186 End stage renal disease: Secondary | ICD-10-CM | POA: Diagnosis not present

## 2022-07-16 DIAGNOSIS — R059 Cough, unspecified: Secondary | ICD-10-CM | POA: Diagnosis not present

## 2022-07-16 DIAGNOSIS — I469 Cardiac arrest, cause unspecified: Secondary | ICD-10-CM | POA: Diagnosis not present

## 2022-07-16 DIAGNOSIS — D84821 Immunodeficiency due to drugs: Secondary | ICD-10-CM | POA: Diagnosis not present

## 2022-07-16 DIAGNOSIS — Z743 Need for continuous supervision: Secondary | ICD-10-CM | POA: Diagnosis not present

## 2022-07-16 DIAGNOSIS — Z66 Do not resuscitate: Secondary | ICD-10-CM | POA: Diagnosis not present

## 2022-07-16 DIAGNOSIS — J45909 Unspecified asthma, uncomplicated: Secondary | ICD-10-CM | POA: Diagnosis not present

## 2022-07-16 DIAGNOSIS — R1310 Dysphagia, unspecified: Secondary | ICD-10-CM | POA: Diagnosis not present

## 2022-07-16 DIAGNOSIS — R441 Visual hallucinations: Secondary | ICD-10-CM | POA: Diagnosis not present

## 2022-07-16 DIAGNOSIS — Z8661 Personal history of infections of the central nervous system: Secondary | ICD-10-CM | POA: Diagnosis not present

## 2022-07-16 DIAGNOSIS — Z431 Encounter for attention to gastrostomy: Secondary | ICD-10-CM | POA: Diagnosis not present

## 2022-07-16 DIAGNOSIS — G928 Other toxic encephalopathy: Secondary | ICD-10-CM | POA: Diagnosis not present

## 2022-07-16 DIAGNOSIS — R140 Abdominal distension (gaseous): Secondary | ICD-10-CM | POA: Diagnosis not present

## 2022-07-16 DIAGNOSIS — G9340 Encephalopathy, unspecified: Secondary | ICD-10-CM | POA: Diagnosis not present

## 2022-07-16 DIAGNOSIS — D849 Immunodeficiency, unspecified: Secondary | ICD-10-CM | POA: Diagnosis not present

## 2022-07-16 DIAGNOSIS — I69154 Hemiplegia and hemiparesis following nontraumatic intracerebral hemorrhage affecting left non-dominant side: Secondary | ICD-10-CM | POA: Diagnosis not present

## 2022-07-16 DIAGNOSIS — Z930 Tracheostomy status: Secondary | ICD-10-CM | POA: Diagnosis not present

## 2022-07-16 DIAGNOSIS — I10 Essential (primary) hypertension: Secondary | ICD-10-CM | POA: Diagnosis not present

## 2022-07-16 DIAGNOSIS — I120 Hypertensive chronic kidney disease with stage 5 chronic kidney disease or end stage renal disease: Secondary | ICD-10-CM | POA: Diagnosis not present

## 2022-07-16 DIAGNOSIS — R918 Other nonspecific abnormal finding of lung field: Secondary | ICD-10-CM | POA: Diagnosis not present

## 2022-07-16 DIAGNOSIS — D631 Anemia in chronic kidney disease: Secondary | ICD-10-CM | POA: Diagnosis not present

## 2022-07-16 DIAGNOSIS — J9509 Other tracheostomy complication: Secondary | ICD-10-CM | POA: Diagnosis not present

## 2022-07-16 DIAGNOSIS — N3000 Acute cystitis without hematuria: Secondary | ICD-10-CM | POA: Diagnosis not present

## 2022-07-16 DIAGNOSIS — G40909 Epilepsy, unspecified, not intractable, without status epilepticus: Secondary | ICD-10-CM | POA: Diagnosis not present

## 2022-07-16 DIAGNOSIS — G8250 Quadriplegia, unspecified: Secondary | ICD-10-CM | POA: Diagnosis not present

## 2022-07-16 DIAGNOSIS — L89322 Pressure ulcer of left buttock, stage 2: Secondary | ICD-10-CM | POA: Diagnosis not present

## 2022-07-16 DIAGNOSIS — N3090 Cystitis, unspecified without hematuria: Secondary | ICD-10-CM | POA: Diagnosis not present

## 2022-07-16 DIAGNOSIS — N041 Nephrotic syndrome with focal and segmental glomerular lesions: Secondary | ICD-10-CM | POA: Diagnosis not present

## 2022-07-16 DIAGNOSIS — J9501 Hemorrhage from tracheostomy stoma: Secondary | ICD-10-CM | POA: Diagnosis not present

## 2022-07-16 DIAGNOSIS — Z79899 Other long term (current) drug therapy: Secondary | ICD-10-CM | POA: Diagnosis not present

## 2022-07-16 DIAGNOSIS — I739 Peripheral vascular disease, unspecified: Secondary | ICD-10-CM | POA: Diagnosis not present

## 2022-07-16 DIAGNOSIS — R197 Diarrhea, unspecified: Secondary | ICD-10-CM | POA: Diagnosis not present

## 2022-07-16 DIAGNOSIS — R531 Weakness: Secondary | ICD-10-CM | POA: Diagnosis not present

## 2022-07-16 DIAGNOSIS — G40911 Epilepsy, unspecified, intractable, with status epilepticus: Secondary | ICD-10-CM | POA: Diagnosis not present

## 2022-07-16 DIAGNOSIS — B965 Pseudomonas (aeruginosa) (mallei) (pseudomallei) as the cause of diseases classified elsewhere: Secondary | ICD-10-CM | POA: Diagnosis not present

## 2022-08-01 DIAGNOSIS — I739 Peripheral vascular disease, unspecified: Secondary | ICD-10-CM | POA: Diagnosis not present

## 2022-08-01 DIAGNOSIS — R1310 Dysphagia, unspecified: Secondary | ICD-10-CM | POA: Diagnosis not present

## 2022-08-01 DIAGNOSIS — N186 End stage renal disease: Secondary | ICD-10-CM | POA: Diagnosis not present

## 2022-08-01 DIAGNOSIS — Z430 Encounter for attention to tracheostomy: Secondary | ICD-10-CM | POA: Diagnosis not present

## 2022-08-01 DIAGNOSIS — D631 Anemia in chronic kidney disease: Secondary | ICD-10-CM | POA: Diagnosis not present

## 2022-08-01 DIAGNOSIS — I120 Hypertensive chronic kidney disease with stage 5 chronic kidney disease or end stage renal disease: Secondary | ICD-10-CM | POA: Diagnosis not present

## 2022-08-01 DIAGNOSIS — A9231 West Nile virus infection with encephalitis: Secondary | ICD-10-CM | POA: Diagnosis not present

## 2022-08-01 DIAGNOSIS — N041 Nephrotic syndrome with focal and segmental glomerular lesions: Secondary | ICD-10-CM | POA: Diagnosis not present

## 2022-08-01 DIAGNOSIS — Z431 Encounter for attention to gastrostomy: Secondary | ICD-10-CM | POA: Diagnosis not present

## 2022-08-03 DIAGNOSIS — I120 Hypertensive chronic kidney disease with stage 5 chronic kidney disease or end stage renal disease: Secondary | ICD-10-CM | POA: Diagnosis not present

## 2022-08-03 DIAGNOSIS — Z431 Encounter for attention to gastrostomy: Secondary | ICD-10-CM | POA: Diagnosis not present

## 2022-08-03 DIAGNOSIS — D849 Immunodeficiency, unspecified: Secondary | ICD-10-CM | POA: Diagnosis not present

## 2022-08-03 DIAGNOSIS — R1310 Dysphagia, unspecified: Secondary | ICD-10-CM | POA: Diagnosis not present

## 2022-08-03 DIAGNOSIS — N186 End stage renal disease: Secondary | ICD-10-CM | POA: Diagnosis not present

## 2022-08-03 DIAGNOSIS — Z940 Kidney transplant status: Secondary | ICD-10-CM | POA: Diagnosis not present

## 2022-08-03 DIAGNOSIS — A9231 West Nile virus infection with encephalitis: Secondary | ICD-10-CM | POA: Diagnosis not present

## 2022-08-03 DIAGNOSIS — Z430 Encounter for attention to tracheostomy: Secondary | ICD-10-CM | POA: Diagnosis not present

## 2022-08-03 DIAGNOSIS — D631 Anemia in chronic kidney disease: Secondary | ICD-10-CM | POA: Diagnosis not present

## 2022-08-03 DIAGNOSIS — I739 Peripheral vascular disease, unspecified: Secondary | ICD-10-CM | POA: Diagnosis not present

## 2022-08-03 DIAGNOSIS — N041 Nephrotic syndrome with focal and segmental glomerular lesions: Secondary | ICD-10-CM | POA: Diagnosis not present

## 2022-08-05 DIAGNOSIS — N186 End stage renal disease: Secondary | ICD-10-CM | POA: Diagnosis not present

## 2022-08-05 DIAGNOSIS — R1310 Dysphagia, unspecified: Secondary | ICD-10-CM | POA: Diagnosis not present

## 2022-08-05 DIAGNOSIS — I739 Peripheral vascular disease, unspecified: Secondary | ICD-10-CM | POA: Diagnosis not present

## 2022-08-05 DIAGNOSIS — Z430 Encounter for attention to tracheostomy: Secondary | ICD-10-CM | POA: Diagnosis not present

## 2022-08-05 DIAGNOSIS — N041 Nephrotic syndrome with focal and segmental glomerular lesions: Secondary | ICD-10-CM | POA: Diagnosis not present

## 2022-08-05 DIAGNOSIS — Z431 Encounter for attention to gastrostomy: Secondary | ICD-10-CM | POA: Diagnosis not present

## 2022-08-05 DIAGNOSIS — I120 Hypertensive chronic kidney disease with stage 5 chronic kidney disease or end stage renal disease: Secondary | ICD-10-CM | POA: Diagnosis not present

## 2022-08-05 DIAGNOSIS — D631 Anemia in chronic kidney disease: Secondary | ICD-10-CM | POA: Diagnosis not present

## 2022-08-05 DIAGNOSIS — A9231 West Nile virus infection with encephalitis: Secondary | ICD-10-CM | POA: Diagnosis not present

## 2022-08-06 DIAGNOSIS — Z430 Encounter for attention to tracheostomy: Secondary | ICD-10-CM | POA: Diagnosis not present

## 2022-08-06 DIAGNOSIS — N041 Nephrotic syndrome with focal and segmental glomerular lesions: Secondary | ICD-10-CM | POA: Diagnosis not present

## 2022-08-06 DIAGNOSIS — Z794 Long term (current) use of insulin: Secondary | ICD-10-CM | POA: Diagnosis not present

## 2022-08-06 DIAGNOSIS — I739 Peripheral vascular disease, unspecified: Secondary | ICD-10-CM | POA: Diagnosis not present

## 2022-08-06 DIAGNOSIS — N186 End stage renal disease: Secondary | ICD-10-CM | POA: Diagnosis not present

## 2022-08-06 DIAGNOSIS — R1310 Dysphagia, unspecified: Secondary | ICD-10-CM | POA: Diagnosis not present

## 2022-08-06 DIAGNOSIS — D631 Anemia in chronic kidney disease: Secondary | ICD-10-CM | POA: Diagnosis not present

## 2022-08-06 DIAGNOSIS — Z431 Encounter for attention to gastrostomy: Secondary | ICD-10-CM | POA: Diagnosis not present

## 2022-08-06 DIAGNOSIS — G9341 Metabolic encephalopathy: Secondary | ICD-10-CM | POA: Diagnosis not present

## 2022-08-06 DIAGNOSIS — I10 Essential (primary) hypertension: Secondary | ICD-10-CM | POA: Diagnosis not present

## 2022-08-11 DIAGNOSIS — G9341 Metabolic encephalopathy: Secondary | ICD-10-CM | POA: Diagnosis not present

## 2022-08-11 DIAGNOSIS — N041 Nephrotic syndrome with focal and segmental glomerular lesions: Secondary | ICD-10-CM | POA: Diagnosis not present

## 2022-08-11 DIAGNOSIS — Z431 Encounter for attention to gastrostomy: Secondary | ICD-10-CM | POA: Diagnosis not present

## 2022-08-11 DIAGNOSIS — I10 Essential (primary) hypertension: Secondary | ICD-10-CM | POA: Diagnosis not present

## 2022-08-11 DIAGNOSIS — I739 Peripheral vascular disease, unspecified: Secondary | ICD-10-CM | POA: Diagnosis not present

## 2022-08-11 DIAGNOSIS — Z430 Encounter for attention to tracheostomy: Secondary | ICD-10-CM | POA: Diagnosis not present

## 2022-08-11 DIAGNOSIS — D631 Anemia in chronic kidney disease: Secondary | ICD-10-CM | POA: Diagnosis not present

## 2022-08-11 DIAGNOSIS — R1310 Dysphagia, unspecified: Secondary | ICD-10-CM | POA: Diagnosis not present

## 2022-08-11 DIAGNOSIS — N186 End stage renal disease: Secondary | ICD-10-CM | POA: Diagnosis not present

## 2022-08-12 DIAGNOSIS — Z430 Encounter for attention to tracheostomy: Secondary | ICD-10-CM | POA: Diagnosis not present

## 2022-08-12 DIAGNOSIS — Z431 Encounter for attention to gastrostomy: Secondary | ICD-10-CM | POA: Diagnosis not present

## 2022-08-12 DIAGNOSIS — G9341 Metabolic encephalopathy: Secondary | ICD-10-CM | POA: Diagnosis not present

## 2022-08-12 DIAGNOSIS — D631 Anemia in chronic kidney disease: Secondary | ICD-10-CM | POA: Diagnosis not present

## 2022-08-12 DIAGNOSIS — R1310 Dysphagia, unspecified: Secondary | ICD-10-CM | POA: Diagnosis not present

## 2022-08-12 DIAGNOSIS — N186 End stage renal disease: Secondary | ICD-10-CM | POA: Diagnosis not present

## 2022-08-12 DIAGNOSIS — N041 Nephrotic syndrome with focal and segmental glomerular lesions: Secondary | ICD-10-CM | POA: Diagnosis not present

## 2022-08-12 DIAGNOSIS — I739 Peripheral vascular disease, unspecified: Secondary | ICD-10-CM | POA: Diagnosis not present

## 2022-08-12 DIAGNOSIS — I10 Essential (primary) hypertension: Secondary | ICD-10-CM | POA: Diagnosis not present

## 2022-08-14 DIAGNOSIS — N041 Nephrotic syndrome with focal and segmental glomerular lesions: Secondary | ICD-10-CM | POA: Diagnosis not present

## 2022-08-14 DIAGNOSIS — I739 Peripheral vascular disease, unspecified: Secondary | ICD-10-CM | POA: Diagnosis not present

## 2022-08-14 DIAGNOSIS — Z430 Encounter for attention to tracheostomy: Secondary | ICD-10-CM | POA: Diagnosis not present

## 2022-08-14 DIAGNOSIS — N186 End stage renal disease: Secondary | ICD-10-CM | POA: Diagnosis not present

## 2022-08-14 DIAGNOSIS — I10 Essential (primary) hypertension: Secondary | ICD-10-CM | POA: Diagnosis not present

## 2022-08-14 DIAGNOSIS — Z431 Encounter for attention to gastrostomy: Secondary | ICD-10-CM | POA: Diagnosis not present

## 2022-08-14 DIAGNOSIS — D631 Anemia in chronic kidney disease: Secondary | ICD-10-CM | POA: Diagnosis not present

## 2022-08-14 DIAGNOSIS — R1310 Dysphagia, unspecified: Secondary | ICD-10-CM | POA: Diagnosis not present

## 2022-08-14 DIAGNOSIS — G9341 Metabolic encephalopathy: Secondary | ICD-10-CM | POA: Diagnosis not present

## 2022-08-17 DIAGNOSIS — Z431 Encounter for attention to gastrostomy: Secondary | ICD-10-CM | POA: Diagnosis not present

## 2022-08-17 DIAGNOSIS — N041 Nephrotic syndrome with focal and segmental glomerular lesions: Secondary | ICD-10-CM | POA: Diagnosis not present

## 2022-08-17 DIAGNOSIS — R1310 Dysphagia, unspecified: Secondary | ICD-10-CM | POA: Diagnosis not present

## 2022-08-17 DIAGNOSIS — D631 Anemia in chronic kidney disease: Secondary | ICD-10-CM | POA: Diagnosis not present

## 2022-08-17 DIAGNOSIS — G9341 Metabolic encephalopathy: Secondary | ICD-10-CM | POA: Diagnosis not present

## 2022-08-17 DIAGNOSIS — I739 Peripheral vascular disease, unspecified: Secondary | ICD-10-CM | POA: Diagnosis not present

## 2022-08-17 DIAGNOSIS — N186 End stage renal disease: Secondary | ICD-10-CM | POA: Diagnosis not present

## 2022-08-17 DIAGNOSIS — Z430 Encounter for attention to tracheostomy: Secondary | ICD-10-CM | POA: Diagnosis not present

## 2022-08-17 DIAGNOSIS — I10 Essential (primary) hypertension: Secondary | ICD-10-CM | POA: Diagnosis not present

## 2022-08-18 DIAGNOSIS — N186 End stage renal disease: Secondary | ICD-10-CM | POA: Diagnosis not present

## 2022-08-18 DIAGNOSIS — N041 Nephrotic syndrome with focal and segmental glomerular lesions: Secondary | ICD-10-CM | POA: Diagnosis not present

## 2022-08-18 DIAGNOSIS — R1310 Dysphagia, unspecified: Secondary | ICD-10-CM | POA: Diagnosis not present

## 2022-08-18 DIAGNOSIS — I739 Peripheral vascular disease, unspecified: Secondary | ICD-10-CM | POA: Diagnosis not present

## 2022-08-18 DIAGNOSIS — Z430 Encounter for attention to tracheostomy: Secondary | ICD-10-CM | POA: Diagnosis not present

## 2022-08-18 DIAGNOSIS — Z431 Encounter for attention to gastrostomy: Secondary | ICD-10-CM | POA: Diagnosis not present

## 2022-08-18 DIAGNOSIS — G9341 Metabolic encephalopathy: Secondary | ICD-10-CM | POA: Diagnosis not present

## 2022-08-18 DIAGNOSIS — I10 Essential (primary) hypertension: Secondary | ICD-10-CM | POA: Diagnosis not present

## 2022-08-18 DIAGNOSIS — D631 Anemia in chronic kidney disease: Secondary | ICD-10-CM | POA: Diagnosis not present

## 2022-08-21 DIAGNOSIS — G9341 Metabolic encephalopathy: Secondary | ICD-10-CM | POA: Diagnosis not present

## 2022-08-21 DIAGNOSIS — Z430 Encounter for attention to tracheostomy: Secondary | ICD-10-CM | POA: Diagnosis not present

## 2022-08-21 DIAGNOSIS — R1310 Dysphagia, unspecified: Secondary | ICD-10-CM | POA: Diagnosis not present

## 2022-08-21 DIAGNOSIS — N186 End stage renal disease: Secondary | ICD-10-CM | POA: Diagnosis not present

## 2022-08-21 DIAGNOSIS — I739 Peripheral vascular disease, unspecified: Secondary | ICD-10-CM | POA: Diagnosis not present

## 2022-08-21 DIAGNOSIS — D631 Anemia in chronic kidney disease: Secondary | ICD-10-CM | POA: Diagnosis not present

## 2022-08-21 DIAGNOSIS — Z431 Encounter for attention to gastrostomy: Secondary | ICD-10-CM | POA: Diagnosis not present

## 2022-08-21 DIAGNOSIS — N041 Nephrotic syndrome with focal and segmental glomerular lesions: Secondary | ICD-10-CM | POA: Diagnosis not present

## 2022-08-21 DIAGNOSIS — I10 Essential (primary) hypertension: Secondary | ICD-10-CM | POA: Diagnosis not present

## 2022-08-24 DIAGNOSIS — M25551 Pain in right hip: Secondary | ICD-10-CM | POA: Diagnosis not present

## 2022-08-24 DIAGNOSIS — R9431 Abnormal electrocardiogram [ECG] [EKG]: Secondary | ICD-10-CM | POA: Diagnosis not present

## 2022-08-24 DIAGNOSIS — I517 Cardiomegaly: Secondary | ICD-10-CM | POA: Diagnosis not present

## 2022-08-24 DIAGNOSIS — D72829 Elevated white blood cell count, unspecified: Secondary | ICD-10-CM | POA: Diagnosis not present

## 2022-08-24 DIAGNOSIS — R42 Dizziness and giddiness: Secondary | ICD-10-CM | POA: Diagnosis not present

## 2022-08-24 DIAGNOSIS — R918 Other nonspecific abnormal finding of lung field: Secondary | ICD-10-CM | POA: Diagnosis not present

## 2022-08-24 DIAGNOSIS — R69 Illness, unspecified: Secondary | ICD-10-CM | POA: Diagnosis not present

## 2022-08-24 DIAGNOSIS — M160 Bilateral primary osteoarthritis of hip: Secondary | ICD-10-CM | POA: Diagnosis not present

## 2022-08-24 DIAGNOSIS — J9811 Atelectasis: Secondary | ICD-10-CM | POA: Diagnosis not present

## 2022-08-24 DIAGNOSIS — I959 Hypotension, unspecified: Secondary | ICD-10-CM | POA: Diagnosis not present

## 2022-08-24 DIAGNOSIS — D649 Anemia, unspecified: Secondary | ICD-10-CM | POA: Diagnosis not present

## 2022-08-24 DIAGNOSIS — M25572 Pain in left ankle and joints of left foot: Secondary | ICD-10-CM | POA: Diagnosis not present

## 2022-08-24 DIAGNOSIS — J9819 Other pulmonary collapse: Secondary | ICD-10-CM | POA: Diagnosis not present

## 2022-08-24 DIAGNOSIS — R0682 Tachypnea, not elsewhere classified: Secondary | ICD-10-CM | POA: Diagnosis not present

## 2022-08-24 DIAGNOSIS — R0902 Hypoxemia: Secondary | ICD-10-CM | POA: Diagnosis not present

## 2022-08-24 DIAGNOSIS — R590 Localized enlarged lymph nodes: Secondary | ICD-10-CM | POA: Diagnosis not present

## 2022-08-25 DIAGNOSIS — R404 Transient alteration of awareness: Secondary | ICD-10-CM | POA: Diagnosis not present

## 2022-08-25 DIAGNOSIS — G839 Paralytic syndrome, unspecified: Secondary | ICD-10-CM | POA: Diagnosis not present

## 2022-08-25 DIAGNOSIS — Z743 Need for continuous supervision: Secondary | ICD-10-CM | POA: Diagnosis not present

## 2022-08-25 DIAGNOSIS — I10 Essential (primary) hypertension: Secondary | ICD-10-CM | POA: Diagnosis not present

## 2022-08-26 DIAGNOSIS — R1310 Dysphagia, unspecified: Secondary | ICD-10-CM | POA: Diagnosis not present

## 2022-08-26 DIAGNOSIS — G40911 Epilepsy, unspecified, intractable, with status epilepticus: Secondary | ICD-10-CM | POA: Diagnosis not present

## 2022-08-26 DIAGNOSIS — G9341 Metabolic encephalopathy: Secondary | ICD-10-CM | POA: Diagnosis not present

## 2022-08-26 DIAGNOSIS — N186 End stage renal disease: Secondary | ICD-10-CM | POA: Diagnosis not present

## 2022-08-26 DIAGNOSIS — I639 Cerebral infarction, unspecified: Secondary | ICD-10-CM | POA: Diagnosis not present

## 2022-08-26 DIAGNOSIS — Z930 Tracheostomy status: Secondary | ICD-10-CM | POA: Diagnosis not present

## 2022-08-26 DIAGNOSIS — I739 Peripheral vascular disease, unspecified: Secondary | ICD-10-CM | POA: Diagnosis not present

## 2022-08-26 DIAGNOSIS — I69154 Hemiplegia and hemiparesis following nontraumatic intracerebral hemorrhage affecting left non-dominant side: Secondary | ICD-10-CM | POA: Diagnosis not present

## 2022-08-26 DIAGNOSIS — A9231 West Nile virus infection with encephalitis: Secondary | ICD-10-CM | POA: Diagnosis not present

## 2022-08-26 DIAGNOSIS — Z431 Encounter for attention to gastrostomy: Secondary | ICD-10-CM | POA: Diagnosis not present

## 2022-08-26 DIAGNOSIS — Z931 Gastrostomy status: Secondary | ICD-10-CM | POA: Diagnosis not present

## 2022-08-26 DIAGNOSIS — I10 Essential (primary) hypertension: Secondary | ICD-10-CM | POA: Diagnosis not present

## 2022-08-26 DIAGNOSIS — Z430 Encounter for attention to tracheostomy: Secondary | ICD-10-CM | POA: Diagnosis not present

## 2022-08-26 DIAGNOSIS — D631 Anemia in chronic kidney disease: Secondary | ICD-10-CM | POA: Diagnosis not present

## 2022-08-26 DIAGNOSIS — N041 Nephrotic syndrome with focal and segmental glomerular lesions: Secondary | ICD-10-CM | POA: Diagnosis not present

## 2022-08-27 DIAGNOSIS — N041 Nephrotic syndrome with focal and segmental glomerular lesions: Secondary | ICD-10-CM | POA: Diagnosis not present

## 2022-08-27 DIAGNOSIS — G9341 Metabolic encephalopathy: Secondary | ICD-10-CM | POA: Diagnosis not present

## 2022-08-27 DIAGNOSIS — Z430 Encounter for attention to tracheostomy: Secondary | ICD-10-CM | POA: Diagnosis not present

## 2022-08-27 DIAGNOSIS — I739 Peripheral vascular disease, unspecified: Secondary | ICD-10-CM | POA: Diagnosis not present

## 2022-08-27 DIAGNOSIS — N186 End stage renal disease: Secondary | ICD-10-CM | POA: Diagnosis not present

## 2022-08-27 DIAGNOSIS — D631 Anemia in chronic kidney disease: Secondary | ICD-10-CM | POA: Diagnosis not present

## 2022-08-27 DIAGNOSIS — I10 Essential (primary) hypertension: Secondary | ICD-10-CM | POA: Diagnosis not present

## 2022-08-27 DIAGNOSIS — R1310 Dysphagia, unspecified: Secondary | ICD-10-CM | POA: Diagnosis not present

## 2022-08-27 DIAGNOSIS — Z431 Encounter for attention to gastrostomy: Secondary | ICD-10-CM | POA: Diagnosis not present

## 2022-08-28 DIAGNOSIS — Z431 Encounter for attention to gastrostomy: Secondary | ICD-10-CM | POA: Diagnosis not present

## 2022-08-28 DIAGNOSIS — R1310 Dysphagia, unspecified: Secondary | ICD-10-CM | POA: Diagnosis not present

## 2022-08-28 DIAGNOSIS — I10 Essential (primary) hypertension: Secondary | ICD-10-CM | POA: Diagnosis not present

## 2022-08-28 DIAGNOSIS — N186 End stage renal disease: Secondary | ICD-10-CM | POA: Diagnosis not present

## 2022-08-28 DIAGNOSIS — Z430 Encounter for attention to tracheostomy: Secondary | ICD-10-CM | POA: Diagnosis not present

## 2022-08-28 DIAGNOSIS — I739 Peripheral vascular disease, unspecified: Secondary | ICD-10-CM | POA: Diagnosis not present

## 2022-08-28 DIAGNOSIS — N041 Nephrotic syndrome with focal and segmental glomerular lesions: Secondary | ICD-10-CM | POA: Diagnosis not present

## 2022-08-28 DIAGNOSIS — D631 Anemia in chronic kidney disease: Secondary | ICD-10-CM | POA: Diagnosis not present

## 2022-08-28 DIAGNOSIS — G9341 Metabolic encephalopathy: Secondary | ICD-10-CM | POA: Diagnosis not present

## 2022-08-31 DIAGNOSIS — I10 Essential (primary) hypertension: Secondary | ICD-10-CM | POA: Diagnosis not present

## 2022-08-31 DIAGNOSIS — G9341 Metabolic encephalopathy: Secondary | ICD-10-CM | POA: Diagnosis not present

## 2022-08-31 DIAGNOSIS — N041 Nephrotic syndrome with focal and segmental glomerular lesions: Secondary | ICD-10-CM | POA: Diagnosis not present

## 2022-08-31 DIAGNOSIS — D631 Anemia in chronic kidney disease: Secondary | ICD-10-CM | POA: Diagnosis not present

## 2022-08-31 DIAGNOSIS — Z430 Encounter for attention to tracheostomy: Secondary | ICD-10-CM | POA: Diagnosis not present

## 2022-08-31 DIAGNOSIS — N186 End stage renal disease: Secondary | ICD-10-CM | POA: Diagnosis not present

## 2022-08-31 DIAGNOSIS — I739 Peripheral vascular disease, unspecified: Secondary | ICD-10-CM | POA: Diagnosis not present

## 2022-08-31 DIAGNOSIS — R1310 Dysphagia, unspecified: Secondary | ICD-10-CM | POA: Diagnosis not present

## 2022-08-31 DIAGNOSIS — Z431 Encounter for attention to gastrostomy: Secondary | ICD-10-CM | POA: Diagnosis not present

## 2022-09-04 DIAGNOSIS — D631 Anemia in chronic kidney disease: Secondary | ICD-10-CM | POA: Diagnosis not present

## 2022-09-04 DIAGNOSIS — N186 End stage renal disease: Secondary | ICD-10-CM | POA: Diagnosis not present

## 2022-09-04 DIAGNOSIS — Z431 Encounter for attention to gastrostomy: Secondary | ICD-10-CM | POA: Diagnosis not present

## 2022-09-04 DIAGNOSIS — N041 Nephrotic syndrome with focal and segmental glomerular lesions: Secondary | ICD-10-CM | POA: Diagnosis not present

## 2022-09-04 DIAGNOSIS — I10 Essential (primary) hypertension: Secondary | ICD-10-CM | POA: Diagnosis not present

## 2022-09-04 DIAGNOSIS — I739 Peripheral vascular disease, unspecified: Secondary | ICD-10-CM | POA: Diagnosis not present

## 2022-09-04 DIAGNOSIS — Z430 Encounter for attention to tracheostomy: Secondary | ICD-10-CM | POA: Diagnosis not present

## 2022-09-04 DIAGNOSIS — G9341 Metabolic encephalopathy: Secondary | ICD-10-CM | POA: Diagnosis not present

## 2022-09-04 DIAGNOSIS — R1310 Dysphagia, unspecified: Secondary | ICD-10-CM | POA: Diagnosis not present

## 2022-09-14 DIAGNOSIS — I69154 Hemiplegia and hemiparesis following nontraumatic intracerebral hemorrhage affecting left non-dominant side: Secondary | ICD-10-CM | POA: Diagnosis not present

## 2022-09-14 DIAGNOSIS — G40911 Epilepsy, unspecified, intractable, with status epilepticus: Secondary | ICD-10-CM | POA: Diagnosis not present

## 2022-09-14 DIAGNOSIS — Z931 Gastrostomy status: Secondary | ICD-10-CM | POA: Diagnosis not present

## 2022-09-14 DIAGNOSIS — I639 Cerebral infarction, unspecified: Secondary | ICD-10-CM | POA: Diagnosis not present

## 2022-09-14 DIAGNOSIS — A9231 West Nile virus infection with encephalitis: Secondary | ICD-10-CM | POA: Diagnosis not present

## 2022-09-15 DIAGNOSIS — G40911 Epilepsy, unspecified, intractable, with status epilepticus: Secondary | ICD-10-CM | POA: Diagnosis not present

## 2022-09-15 DIAGNOSIS — A9231 West Nile virus infection with encephalitis: Secondary | ICD-10-CM | POA: Diagnosis not present

## 2022-09-15 DIAGNOSIS — Z931 Gastrostomy status: Secondary | ICD-10-CM | POA: Diagnosis not present

## 2022-09-15 DIAGNOSIS — I69154 Hemiplegia and hemiparesis following nontraumatic intracerebral hemorrhage affecting left non-dominant side: Secondary | ICD-10-CM | POA: Diagnosis not present

## 2022-09-15 DIAGNOSIS — I639 Cerebral infarction, unspecified: Secondary | ICD-10-CM | POA: Diagnosis not present

## 2022-09-24 DIAGNOSIS — I69154 Hemiplegia and hemiparesis following nontraumatic intracerebral hemorrhage affecting left non-dominant side: Secondary | ICD-10-CM | POA: Diagnosis not present

## 2022-09-24 DIAGNOSIS — Z930 Tracheostomy status: Secondary | ICD-10-CM | POA: Diagnosis not present

## 2022-09-24 DIAGNOSIS — G40911 Epilepsy, unspecified, intractable, with status epilepticus: Secondary | ICD-10-CM | POA: Diagnosis not present

## 2022-09-24 DIAGNOSIS — I639 Cerebral infarction, unspecified: Secondary | ICD-10-CM | POA: Diagnosis not present

## 2022-09-24 DIAGNOSIS — Z931 Gastrostomy status: Secondary | ICD-10-CM | POA: Diagnosis not present

## 2022-09-24 DIAGNOSIS — A9231 West Nile virus infection with encephalitis: Secondary | ICD-10-CM | POA: Diagnosis not present

## 2022-10-23 DIAGNOSIS — Z8669 Personal history of other diseases of the nervous system and sense organs: Secondary | ICD-10-CM | POA: Diagnosis not present

## 2022-10-23 DIAGNOSIS — I959 Hypotension, unspecified: Secondary | ICD-10-CM | POA: Diagnosis not present

## 2022-10-23 DIAGNOSIS — R531 Weakness: Secondary | ICD-10-CM | POA: Diagnosis not present

## 2022-10-23 DIAGNOSIS — R58 Hemorrhage, not elsewhere classified: Secondary | ICD-10-CM | POA: Diagnosis not present

## 2022-10-23 DIAGNOSIS — Z430 Encounter for attention to tracheostomy: Secondary | ICD-10-CM | POA: Diagnosis not present

## 2022-10-23 DIAGNOSIS — Z930 Tracheostomy status: Secondary | ICD-10-CM | POA: Diagnosis not present

## 2022-10-23 DIAGNOSIS — Z7401 Bed confinement status: Secondary | ICD-10-CM | POA: Diagnosis not present

## 2022-10-25 DIAGNOSIS — G40911 Epilepsy, unspecified, intractable, with status epilepticus: Secondary | ICD-10-CM | POA: Diagnosis not present

## 2022-10-25 DIAGNOSIS — A9231 West Nile virus infection with encephalitis: Secondary | ICD-10-CM | POA: Diagnosis not present

## 2022-10-25 DIAGNOSIS — Z930 Tracheostomy status: Secondary | ICD-10-CM | POA: Diagnosis not present

## 2022-10-25 DIAGNOSIS — Z931 Gastrostomy status: Secondary | ICD-10-CM | POA: Diagnosis not present

## 2022-10-25 DIAGNOSIS — I639 Cerebral infarction, unspecified: Secondary | ICD-10-CM | POA: Diagnosis not present

## 2022-10-25 DIAGNOSIS — I69154 Hemiplegia and hemiparesis following nontraumatic intracerebral hemorrhage affecting left non-dominant side: Secondary | ICD-10-CM | POA: Diagnosis not present

## 2022-10-30 DIAGNOSIS — Z0130 Encounter for examination of blood pressure without abnormal findings: Secondary | ICD-10-CM | POA: Diagnosis not present

## 2022-10-30 DIAGNOSIS — R0602 Shortness of breath: Secondary | ICD-10-CM | POA: Diagnosis not present

## 2022-10-30 DIAGNOSIS — E860 Dehydration: Secondary | ICD-10-CM | POA: Diagnosis not present

## 2022-10-30 DIAGNOSIS — R9431 Abnormal electrocardiogram [ECG] [EKG]: Secondary | ICD-10-CM | POA: Diagnosis not present

## 2022-10-30 DIAGNOSIS — I959 Hypotension, unspecified: Secondary | ICD-10-CM | POA: Diagnosis not present

## 2022-10-30 DIAGNOSIS — D508 Other iron deficiency anemias: Secondary | ICD-10-CM | POA: Diagnosis not present

## 2022-10-30 DIAGNOSIS — M79604 Pain in right leg: Secondary | ICD-10-CM | POA: Diagnosis not present

## 2022-10-30 DIAGNOSIS — Z6821 Body mass index (BMI) 21.0-21.9, adult: Secondary | ICD-10-CM | POA: Diagnosis not present

## 2022-10-30 DIAGNOSIS — M79605 Pain in left leg: Secondary | ICD-10-CM | POA: Diagnosis not present

## 2022-10-31 DIAGNOSIS — Z931 Gastrostomy status: Secondary | ICD-10-CM | POA: Diagnosis not present

## 2022-10-31 DIAGNOSIS — Z930 Tracheostomy status: Secondary | ICD-10-CM | POA: Diagnosis not present

## 2022-10-31 DIAGNOSIS — D84821 Immunodeficiency due to drugs: Secondary | ICD-10-CM | POA: Diagnosis not present

## 2022-10-31 DIAGNOSIS — Z7401 Bed confinement status: Secondary | ICD-10-CM | POA: Diagnosis not present

## 2022-10-31 DIAGNOSIS — E876 Hypokalemia: Secondary | ICD-10-CM | POA: Diagnosis not present

## 2022-10-31 DIAGNOSIS — G40909 Epilepsy, unspecified, not intractable, without status epilepticus: Secondary | ICD-10-CM | POA: Diagnosis not present

## 2022-10-31 DIAGNOSIS — Z940 Kidney transplant status: Secondary | ICD-10-CM | POA: Diagnosis not present

## 2022-10-31 DIAGNOSIS — R4182 Altered mental status, unspecified: Secondary | ICD-10-CM | POA: Diagnosis not present

## 2022-10-31 DIAGNOSIS — N182 Chronic kidney disease, stage 2 (mild): Secondary | ICD-10-CM | POA: Diagnosis not present

## 2022-10-31 DIAGNOSIS — N179 Acute kidney failure, unspecified: Secondary | ICD-10-CM | POA: Diagnosis not present

## 2022-10-31 DIAGNOSIS — D649 Anemia, unspecified: Secondary | ICD-10-CM | POA: Diagnosis not present

## 2022-10-31 DIAGNOSIS — R638 Other symptoms and signs concerning food and fluid intake: Secondary | ICD-10-CM | POA: Diagnosis not present

## 2022-10-31 DIAGNOSIS — F22 Delusional disorders: Secondary | ICD-10-CM | POA: Diagnosis not present

## 2022-10-31 DIAGNOSIS — T8613 Kidney transplant infection: Secondary | ICD-10-CM | POA: Diagnosis not present

## 2022-10-31 DIAGNOSIS — R7881 Bacteremia: Secondary | ICD-10-CM | POA: Diagnosis not present

## 2022-10-31 DIAGNOSIS — R0902 Hypoxemia: Secondary | ICD-10-CM | POA: Diagnosis not present

## 2022-10-31 DIAGNOSIS — I959 Hypotension, unspecified: Secondary | ICD-10-CM | POA: Diagnosis not present

## 2022-10-31 DIAGNOSIS — E871 Hypo-osmolality and hyponatremia: Secondary | ICD-10-CM | POA: Diagnosis not present

## 2022-10-31 DIAGNOSIS — G9341 Metabolic encephalopathy: Secondary | ICD-10-CM | POA: Diagnosis not present

## 2022-10-31 DIAGNOSIS — I517 Cardiomegaly: Secondary | ICD-10-CM | POA: Diagnosis not present

## 2022-10-31 DIAGNOSIS — R059 Cough, unspecified: Secondary | ICD-10-CM | POA: Diagnosis not present

## 2022-10-31 DIAGNOSIS — Z4822 Encounter for aftercare following kidney transplant: Secondary | ICD-10-CM | POA: Diagnosis not present

## 2022-10-31 DIAGNOSIS — I69354 Hemiplegia and hemiparesis following cerebral infarction affecting left non-dominant side: Secondary | ICD-10-CM | POA: Diagnosis not present

## 2022-10-31 DIAGNOSIS — D631 Anemia in chronic kidney disease: Secondary | ICD-10-CM | POA: Diagnosis not present

## 2022-11-10 DIAGNOSIS — Z940 Kidney transplant status: Secondary | ICD-10-CM | POA: Diagnosis not present

## 2022-11-24 DIAGNOSIS — I639 Cerebral infarction, unspecified: Secondary | ICD-10-CM | POA: Diagnosis not present

## 2022-11-24 DIAGNOSIS — A9231 West Nile virus infection with encephalitis: Secondary | ICD-10-CM | POA: Diagnosis not present

## 2022-11-24 DIAGNOSIS — G40911 Epilepsy, unspecified, intractable, with status epilepticus: Secondary | ICD-10-CM | POA: Diagnosis not present

## 2022-11-24 DIAGNOSIS — Z930 Tracheostomy status: Secondary | ICD-10-CM | POA: Diagnosis not present

## 2022-11-24 DIAGNOSIS — Z931 Gastrostomy status: Secondary | ICD-10-CM | POA: Diagnosis not present

## 2022-11-24 DIAGNOSIS — I69154 Hemiplegia and hemiparesis following nontraumatic intracerebral hemorrhage affecting left non-dominant side: Secondary | ICD-10-CM | POA: Diagnosis not present

## 2022-12-25 DIAGNOSIS — A9231 West Nile virus infection with encephalitis: Secondary | ICD-10-CM | POA: Diagnosis not present

## 2022-12-25 DIAGNOSIS — G40911 Epilepsy, unspecified, intractable, with status epilepticus: Secondary | ICD-10-CM | POA: Diagnosis not present

## 2022-12-25 DIAGNOSIS — Z931 Gastrostomy status: Secondary | ICD-10-CM | POA: Diagnosis not present

## 2022-12-25 DIAGNOSIS — I639 Cerebral infarction, unspecified: Secondary | ICD-10-CM | POA: Diagnosis not present

## 2022-12-25 DIAGNOSIS — I69154 Hemiplegia and hemiparesis following nontraumatic intracerebral hemorrhage affecting left non-dominant side: Secondary | ICD-10-CM | POA: Diagnosis not present

## 2022-12-25 DIAGNOSIS — Z930 Tracheostomy status: Secondary | ICD-10-CM | POA: Diagnosis not present

## 2023-01-24 DIAGNOSIS — A9231 West Nile virus infection with encephalitis: Secondary | ICD-10-CM | POA: Diagnosis not present

## 2023-01-24 DIAGNOSIS — I69154 Hemiplegia and hemiparesis following nontraumatic intracerebral hemorrhage affecting left non-dominant side: Secondary | ICD-10-CM | POA: Diagnosis not present

## 2023-01-24 DIAGNOSIS — Z930 Tracheostomy status: Secondary | ICD-10-CM | POA: Diagnosis not present

## 2023-01-24 DIAGNOSIS — G40911 Epilepsy, unspecified, intractable, with status epilepticus: Secondary | ICD-10-CM | POA: Diagnosis not present

## 2023-01-24 DIAGNOSIS — I639 Cerebral infarction, unspecified: Secondary | ICD-10-CM | POA: Diagnosis not present

## 2023-01-24 DIAGNOSIS — Z931 Gastrostomy status: Secondary | ICD-10-CM | POA: Diagnosis not present

## 2023-02-02 DIAGNOSIS — R5383 Other fatigue: Secondary | ICD-10-CM | POA: Diagnosis not present

## 2023-02-02 DIAGNOSIS — M62838 Other muscle spasm: Secondary | ICD-10-CM | POA: Diagnosis not present

## 2023-02-02 DIAGNOSIS — G8250 Quadriplegia, unspecified: Secondary | ICD-10-CM | POA: Diagnosis not present

## 2023-02-02 DIAGNOSIS — Z681 Body mass index (BMI) 19 or less, adult: Secondary | ICD-10-CM | POA: Diagnosis not present

## 2023-02-02 DIAGNOSIS — Z0000 Encounter for general adult medical examination without abnormal findings: Secondary | ICD-10-CM | POA: Diagnosis not present

## 2023-02-02 DIAGNOSIS — E559 Vitamin D deficiency, unspecified: Secondary | ICD-10-CM | POA: Diagnosis not present

## 2023-02-02 DIAGNOSIS — Z8619 Personal history of other infectious and parasitic diseases: Secondary | ICD-10-CM | POA: Diagnosis not present

## 2023-02-02 DIAGNOSIS — Z931 Gastrostomy status: Secondary | ICD-10-CM | POA: Diagnosis not present

## 2023-02-02 DIAGNOSIS — Z79899 Other long term (current) drug therapy: Secondary | ICD-10-CM | POA: Diagnosis not present

## 2023-02-02 DIAGNOSIS — E7800 Pure hypercholesterolemia, unspecified: Secondary | ICD-10-CM | POA: Diagnosis not present

## 2023-02-06 DIAGNOSIS — R0689 Other abnormalities of breathing: Secondary | ICD-10-CM | POA: Diagnosis not present

## 2023-02-06 DIAGNOSIS — G8104 Flaccid hemiplegia affecting left nondominant side: Secondary | ICD-10-CM | POA: Diagnosis not present

## 2023-02-06 DIAGNOSIS — A419 Sepsis, unspecified organism: Secondary | ICD-10-CM | POA: Diagnosis not present

## 2023-02-06 DIAGNOSIS — R633 Feeding difficulties, unspecified: Secondary | ICD-10-CM | POA: Diagnosis not present

## 2023-02-06 DIAGNOSIS — R410 Disorientation, unspecified: Secondary | ICD-10-CM | POA: Diagnosis not present

## 2023-02-06 DIAGNOSIS — F22 Delusional disorders: Secondary | ICD-10-CM | POA: Diagnosis not present

## 2023-02-06 DIAGNOSIS — D849 Immunodeficiency, unspecified: Secondary | ICD-10-CM | POA: Diagnosis not present

## 2023-02-06 DIAGNOSIS — R1031 Right lower quadrant pain: Secondary | ICD-10-CM | POA: Diagnosis not present

## 2023-02-06 DIAGNOSIS — R509 Fever, unspecified: Secondary | ICD-10-CM | POA: Diagnosis not present

## 2023-02-06 DIAGNOSIS — I10 Essential (primary) hypertension: Secondary | ICD-10-CM | POA: Diagnosis not present

## 2023-02-06 DIAGNOSIS — E43 Unspecified severe protein-calorie malnutrition: Secondary | ICD-10-CM | POA: Diagnosis not present

## 2023-02-06 DIAGNOSIS — R000 Tachycardia, unspecified: Secondary | ICD-10-CM | POA: Diagnosis not present

## 2023-02-06 DIAGNOSIS — R279 Unspecified lack of coordination: Secondary | ICD-10-CM | POA: Diagnosis not present

## 2023-02-06 DIAGNOSIS — Z940 Kidney transplant status: Secondary | ICD-10-CM | POA: Diagnosis not present

## 2023-02-06 DIAGNOSIS — R9431 Abnormal electrocardiogram [ECG] [EKG]: Secondary | ICD-10-CM | POA: Diagnosis not present

## 2023-02-06 DIAGNOSIS — K5909 Other constipation: Secondary | ICD-10-CM | POA: Diagnosis not present

## 2023-02-06 DIAGNOSIS — R4182 Altered mental status, unspecified: Secondary | ICD-10-CM | POA: Diagnosis not present

## 2023-02-06 DIAGNOSIS — R918 Other nonspecific abnormal finding of lung field: Secondary | ICD-10-CM | POA: Diagnosis not present

## 2023-02-06 DIAGNOSIS — E871 Hypo-osmolality and hyponatremia: Secondary | ICD-10-CM | POA: Diagnosis not present

## 2023-02-06 DIAGNOSIS — G8220 Paraplegia, unspecified: Secondary | ICD-10-CM | POA: Diagnosis not present

## 2023-02-06 DIAGNOSIS — R1312 Dysphagia, oropharyngeal phase: Secondary | ICD-10-CM | POA: Diagnosis not present

## 2023-02-06 DIAGNOSIS — Z9225 Personal history of immunosupression therapy: Secondary | ICD-10-CM | POA: Diagnosis not present

## 2023-02-06 DIAGNOSIS — Z930 Tracheostomy status: Secondary | ICD-10-CM | POA: Diagnosis not present

## 2023-02-06 DIAGNOSIS — I517 Cardiomegaly: Secondary | ICD-10-CM | POA: Diagnosis not present

## 2023-02-06 DIAGNOSIS — N3000 Acute cystitis without hematuria: Secondary | ICD-10-CM | POA: Diagnosis not present

## 2023-02-06 DIAGNOSIS — J9611 Chronic respiratory failure with hypoxia: Secondary | ICD-10-CM | POA: Diagnosis not present

## 2023-02-06 DIAGNOSIS — R1030 Lower abdominal pain, unspecified: Secondary | ICD-10-CM | POA: Diagnosis not present

## 2023-02-06 DIAGNOSIS — D84821 Immunodeficiency due to drugs: Secondary | ICD-10-CM | POA: Diagnosis not present

## 2023-02-15 DIAGNOSIS — E039 Hypothyroidism, unspecified: Secondary | ICD-10-CM | POA: Diagnosis not present

## 2023-02-15 DIAGNOSIS — J9610 Chronic respiratory failure, unspecified whether with hypoxia or hypercapnia: Secondary | ICD-10-CM | POA: Diagnosis not present

## 2023-02-15 DIAGNOSIS — D649 Anemia, unspecified: Secondary | ICD-10-CM | POA: Diagnosis not present

## 2023-02-15 DIAGNOSIS — E43 Unspecified severe protein-calorie malnutrition: Secondary | ICD-10-CM | POA: Diagnosis not present

## 2023-02-16 DIAGNOSIS — F32A Depression, unspecified: Secondary | ICD-10-CM | POA: Diagnosis not present

## 2023-02-17 DIAGNOSIS — G5692 Unspecified mononeuropathy of left upper limb: Secondary | ICD-10-CM | POA: Diagnosis not present

## 2023-02-17 DIAGNOSIS — L89613 Pressure ulcer of right heel, stage 3: Secondary | ICD-10-CM | POA: Diagnosis not present

## 2023-02-17 DIAGNOSIS — M24542 Contracture, left hand: Secondary | ICD-10-CM | POA: Diagnosis not present

## 2023-02-17 DIAGNOSIS — N189 Chronic kidney disease, unspecified: Secondary | ICD-10-CM | POA: Diagnosis not present

## 2023-02-17 DIAGNOSIS — Z681 Body mass index (BMI) 19 or less, adult: Secondary | ICD-10-CM | POA: Diagnosis not present

## 2023-02-18 DIAGNOSIS — R0989 Other specified symptoms and signs involving the circulatory and respiratory systems: Secondary | ICD-10-CM | POA: Diagnosis not present

## 2023-02-18 DIAGNOSIS — R051 Acute cough: Secondary | ICD-10-CM | POA: Diagnosis not present

## 2023-02-18 DIAGNOSIS — I10 Essential (primary) hypertension: Secondary | ICD-10-CM | POA: Diagnosis not present

## 2023-02-18 DIAGNOSIS — Z930 Tracheostomy status: Secondary | ICD-10-CM | POA: Diagnosis not present

## 2023-02-19 DIAGNOSIS — E039 Hypothyroidism, unspecified: Secondary | ICD-10-CM | POA: Diagnosis not present

## 2023-02-19 DIAGNOSIS — R569 Unspecified convulsions: Secondary | ICD-10-CM | POA: Diagnosis not present

## 2023-02-24 DIAGNOSIS — I639 Cerebral infarction, unspecified: Secondary | ICD-10-CM | POA: Diagnosis not present

## 2023-02-24 DIAGNOSIS — I69154 Hemiplegia and hemiparesis following nontraumatic intracerebral hemorrhage affecting left non-dominant side: Secondary | ICD-10-CM | POA: Diagnosis not present

## 2023-02-24 DIAGNOSIS — L89613 Pressure ulcer of right heel, stage 3: Secondary | ICD-10-CM | POA: Diagnosis not present

## 2023-02-24 DIAGNOSIS — A9231 West Nile virus infection with encephalitis: Secondary | ICD-10-CM | POA: Diagnosis not present

## 2023-02-24 DIAGNOSIS — G40911 Epilepsy, unspecified, intractable, with status epilepticus: Secondary | ICD-10-CM | POA: Diagnosis not present

## 2023-02-24 DIAGNOSIS — Z930 Tracheostomy status: Secondary | ICD-10-CM | POA: Diagnosis not present

## 2023-02-24 DIAGNOSIS — Z931 Gastrostomy status: Secondary | ICD-10-CM | POA: Diagnosis not present

## 2023-02-26 DIAGNOSIS — G4089 Other seizures: Secondary | ICD-10-CM | POA: Diagnosis not present

## 2023-03-01 DIAGNOSIS — R569 Unspecified convulsions: Secondary | ICD-10-CM | POA: Diagnosis not present

## 2023-03-02 DIAGNOSIS — D649 Anemia, unspecified: Secondary | ICD-10-CM | POA: Diagnosis not present

## 2023-03-02 DIAGNOSIS — E43 Unspecified severe protein-calorie malnutrition: Secondary | ICD-10-CM | POA: Diagnosis not present

## 2023-03-02 DIAGNOSIS — R634 Abnormal weight loss: Secondary | ICD-10-CM | POA: Diagnosis not present

## 2023-03-02 DIAGNOSIS — Z931 Gastrostomy status: Secondary | ICD-10-CM | POA: Diagnosis not present

## 2023-03-03 DIAGNOSIS — L89613 Pressure ulcer of right heel, stage 3: Secondary | ICD-10-CM | POA: Diagnosis not present

## 2023-03-03 DIAGNOSIS — N189 Chronic kidney disease, unspecified: Secondary | ICD-10-CM | POA: Diagnosis not present

## 2023-03-03 DIAGNOSIS — I10 Essential (primary) hypertension: Secondary | ICD-10-CM | POA: Diagnosis not present

## 2023-03-03 DIAGNOSIS — Z681 Body mass index (BMI) 19 or less, adult: Secondary | ICD-10-CM | POA: Diagnosis not present

## 2023-03-04 DIAGNOSIS — M62838 Other muscle spasm: Secondary | ICD-10-CM | POA: Diagnosis not present

## 2023-03-04 DIAGNOSIS — G8114 Spastic hemiplegia affecting left nondominant side: Secondary | ICD-10-CM | POA: Diagnosis not present

## 2023-03-05 DIAGNOSIS — L601 Onycholysis: Secondary | ICD-10-CM | POA: Diagnosis not present

## 2023-03-05 DIAGNOSIS — R634 Abnormal weight loss: Secondary | ICD-10-CM | POA: Diagnosis not present

## 2023-03-08 DIAGNOSIS — Z79899 Other long term (current) drug therapy: Secondary | ICD-10-CM | POA: Diagnosis not present

## 2023-03-09 DIAGNOSIS — R569 Unspecified convulsions: Secondary | ICD-10-CM | POA: Diagnosis not present

## 2023-03-09 DIAGNOSIS — H547 Unspecified visual loss: Secondary | ICD-10-CM | POA: Diagnosis not present

## 2023-03-09 DIAGNOSIS — F411 Generalized anxiety disorder: Secondary | ICD-10-CM | POA: Diagnosis not present

## 2023-03-09 DIAGNOSIS — Z930 Tracheostomy status: Secondary | ICD-10-CM | POA: Diagnosis not present

## 2023-03-09 DIAGNOSIS — Z931 Gastrostomy status: Secondary | ICD-10-CM | POA: Diagnosis not present

## 2023-03-09 DIAGNOSIS — G9340 Encephalopathy, unspecified: Secondary | ICD-10-CM | POA: Diagnosis not present

## 2023-03-10 DIAGNOSIS — N189 Chronic kidney disease, unspecified: Secondary | ICD-10-CM | POA: Diagnosis not present

## 2023-03-10 DIAGNOSIS — R569 Unspecified convulsions: Secondary | ICD-10-CM | POA: Diagnosis not present

## 2023-03-10 DIAGNOSIS — L89613 Pressure ulcer of right heel, stage 3: Secondary | ICD-10-CM | POA: Diagnosis not present

## 2023-03-10 DIAGNOSIS — H547 Unspecified visual loss: Secondary | ICD-10-CM | POA: Diagnosis not present

## 2023-03-10 DIAGNOSIS — Z931 Gastrostomy status: Secondary | ICD-10-CM | POA: Diagnosis not present

## 2023-03-10 DIAGNOSIS — Z681 Body mass index (BMI) 19 or less, adult: Secondary | ICD-10-CM | POA: Diagnosis not present

## 2023-03-10 DIAGNOSIS — F411 Generalized anxiety disorder: Secondary | ICD-10-CM | POA: Diagnosis not present

## 2023-03-10 DIAGNOSIS — Z930 Tracheostomy status: Secondary | ICD-10-CM | POA: Diagnosis not present

## 2023-03-18 DIAGNOSIS — R4182 Altered mental status, unspecified: Secondary | ICD-10-CM | POA: Diagnosis not present

## 2023-03-18 DIAGNOSIS — F411 Generalized anxiety disorder: Secondary | ICD-10-CM | POA: Diagnosis not present

## 2023-03-19 DIAGNOSIS — E039 Hypothyroidism, unspecified: Secondary | ICD-10-CM | POA: Diagnosis not present

## 2023-03-22 DIAGNOSIS — B9620 Unspecified Escherichia coli [E. coli] as the cause of diseases classified elsewhere: Secondary | ICD-10-CM | POA: Diagnosis not present

## 2023-03-22 DIAGNOSIS — N390 Urinary tract infection, site not specified: Secondary | ICD-10-CM | POA: Diagnosis not present

## 2023-03-22 DIAGNOSIS — E039 Hypothyroidism, unspecified: Secondary | ICD-10-CM | POA: Diagnosis not present

## 2023-03-25 DIAGNOSIS — E782 Mixed hyperlipidemia: Secondary | ICD-10-CM | POA: Diagnosis not present

## 2023-03-26 DIAGNOSIS — I10 Essential (primary) hypertension: Secondary | ICD-10-CM | POA: Diagnosis not present

## 2023-03-27 DIAGNOSIS — A9231 West Nile virus infection with encephalitis: Secondary | ICD-10-CM | POA: Diagnosis not present

## 2023-03-27 DIAGNOSIS — Z930 Tracheostomy status: Secondary | ICD-10-CM | POA: Diagnosis not present

## 2023-03-27 DIAGNOSIS — I639 Cerebral infarction, unspecified: Secondary | ICD-10-CM | POA: Diagnosis not present

## 2023-03-27 DIAGNOSIS — G40911 Epilepsy, unspecified, intractable, with status epilepticus: Secondary | ICD-10-CM | POA: Diagnosis not present

## 2023-03-27 DIAGNOSIS — Z931 Gastrostomy status: Secondary | ICD-10-CM | POA: Diagnosis not present

## 2023-03-27 DIAGNOSIS — I69154 Hemiplegia and hemiparesis following nontraumatic intracerebral hemorrhage affecting left non-dominant side: Secondary | ICD-10-CM | POA: Diagnosis not present

## 2023-03-29 DIAGNOSIS — F32A Depression, unspecified: Secondary | ICD-10-CM | POA: Diagnosis not present

## 2023-03-29 DIAGNOSIS — E782 Mixed hyperlipidemia: Secondary | ICD-10-CM | POA: Diagnosis not present

## 2023-03-29 DIAGNOSIS — F411 Generalized anxiety disorder: Secondary | ICD-10-CM | POA: Diagnosis not present

## 2023-03-30 DIAGNOSIS — F411 Generalized anxiety disorder: Secondary | ICD-10-CM | POA: Diagnosis not present

## 2023-04-05 DIAGNOSIS — F32A Depression, unspecified: Secondary | ICD-10-CM | POA: Diagnosis not present

## 2023-04-05 DIAGNOSIS — G629 Polyneuropathy, unspecified: Secondary | ICD-10-CM | POA: Diagnosis not present

## 2023-04-05 DIAGNOSIS — G4700 Insomnia, unspecified: Secondary | ICD-10-CM | POA: Diagnosis not present

## 2023-04-06 DIAGNOSIS — G40909 Epilepsy, unspecified, not intractable, without status epilepticus: Secondary | ICD-10-CM | POA: Diagnosis not present

## 2023-04-07 DIAGNOSIS — R569 Unspecified convulsions: Secondary | ICD-10-CM | POA: Diagnosis not present

## 2023-04-20 DIAGNOSIS — F32A Depression, unspecified: Secondary | ICD-10-CM | POA: Diagnosis not present

## 2023-04-21 DIAGNOSIS — F32A Depression, unspecified: Secondary | ICD-10-CM | POA: Diagnosis not present

## 2023-04-26 DIAGNOSIS — Z930 Tracheostomy status: Secondary | ICD-10-CM | POA: Diagnosis not present

## 2023-04-26 DIAGNOSIS — I69154 Hemiplegia and hemiparesis following nontraumatic intracerebral hemorrhage affecting left non-dominant side: Secondary | ICD-10-CM | POA: Diagnosis not present

## 2023-04-26 DIAGNOSIS — I639 Cerebral infarction, unspecified: Secondary | ICD-10-CM | POA: Diagnosis not present

## 2023-04-26 DIAGNOSIS — A9231 West Nile virus infection with encephalitis: Secondary | ICD-10-CM | POA: Diagnosis not present

## 2023-04-26 DIAGNOSIS — Z931 Gastrostomy status: Secondary | ICD-10-CM | POA: Diagnosis not present

## 2023-04-26 DIAGNOSIS — G40911 Epilepsy, unspecified, intractable, with status epilepticus: Secondary | ICD-10-CM | POA: Diagnosis not present

## 2023-04-29 DIAGNOSIS — G629 Polyneuropathy, unspecified: Secondary | ICD-10-CM | POA: Diagnosis not present

## 2023-04-29 DIAGNOSIS — G8194 Hemiplegia, unspecified affecting left nondominant side: Secondary | ICD-10-CM | POA: Diagnosis not present

## 2023-04-29 DIAGNOSIS — G40909 Epilepsy, unspecified, not intractable, without status epilepticus: Secondary | ICD-10-CM | POA: Diagnosis not present

## 2023-04-29 DIAGNOSIS — E039 Hypothyroidism, unspecified: Secondary | ICD-10-CM | POA: Diagnosis not present

## 2023-05-11 DIAGNOSIS — H1045 Other chronic allergic conjunctivitis: Secondary | ICD-10-CM | POA: Diagnosis not present

## 2023-05-27 DIAGNOSIS — Z930 Tracheostomy status: Secondary | ICD-10-CM | POA: Diagnosis not present

## 2023-05-27 DIAGNOSIS — I69154 Hemiplegia and hemiparesis following nontraumatic intracerebral hemorrhage affecting left non-dominant side: Secondary | ICD-10-CM | POA: Diagnosis not present

## 2023-05-27 DIAGNOSIS — G40911 Epilepsy, unspecified, intractable, with status epilepticus: Secondary | ICD-10-CM | POA: Diagnosis not present

## 2023-05-27 DIAGNOSIS — I639 Cerebral infarction, unspecified: Secondary | ICD-10-CM | POA: Diagnosis not present

## 2023-05-27 DIAGNOSIS — Z931 Gastrostomy status: Secondary | ICD-10-CM | POA: Diagnosis not present

## 2023-05-27 DIAGNOSIS — A9231 West Nile virus infection with encephalitis: Secondary | ICD-10-CM | POA: Diagnosis not present

## 2023-06-10 DIAGNOSIS — R310 Gross hematuria: Secondary | ICD-10-CM | POA: Diagnosis not present

## 2023-06-12 DIAGNOSIS — N186 End stage renal disease: Secondary | ICD-10-CM | POA: Diagnosis not present

## 2023-06-14 DIAGNOSIS — N390 Urinary tract infection, site not specified: Secondary | ICD-10-CM | POA: Diagnosis not present

## 2023-06-14 DIAGNOSIS — B9689 Other specified bacterial agents as the cause of diseases classified elsewhere: Secondary | ICD-10-CM | POA: Diagnosis not present

## 2023-06-22 DIAGNOSIS — K219 Gastro-esophageal reflux disease without esophagitis: Secondary | ICD-10-CM | POA: Diagnosis not present

## 2023-06-26 DIAGNOSIS — I639 Cerebral infarction, unspecified: Secondary | ICD-10-CM | POA: Diagnosis not present

## 2023-06-26 DIAGNOSIS — I69154 Hemiplegia and hemiparesis following nontraumatic intracerebral hemorrhage affecting left non-dominant side: Secondary | ICD-10-CM | POA: Diagnosis not present

## 2023-06-26 DIAGNOSIS — Z930 Tracheostomy status: Secondary | ICD-10-CM | POA: Diagnosis not present

## 2023-06-26 DIAGNOSIS — A9231 West Nile virus infection with encephalitis: Secondary | ICD-10-CM | POA: Diagnosis not present

## 2023-06-26 DIAGNOSIS — G40911 Epilepsy, unspecified, intractable, with status epilepticus: Secondary | ICD-10-CM | POA: Diagnosis not present

## 2023-06-26 DIAGNOSIS — Z931 Gastrostomy status: Secondary | ICD-10-CM | POA: Diagnosis not present

## 2023-07-09 DIAGNOSIS — F062 Psychotic disorder with delusions due to known physiological condition: Secondary | ICD-10-CM | POA: Diagnosis not present

## 2023-07-09 DIAGNOSIS — F331 Major depressive disorder, recurrent, moderate: Secondary | ICD-10-CM | POA: Diagnosis not present

## 2023-07-13 DIAGNOSIS — R300 Dysuria: Secondary | ICD-10-CM | POA: Diagnosis not present

## 2023-07-13 DIAGNOSIS — R4182 Altered mental status, unspecified: Secondary | ICD-10-CM | POA: Diagnosis not present

## 2023-07-14 DIAGNOSIS — R4182 Altered mental status, unspecified: Secondary | ICD-10-CM | POA: Diagnosis not present

## 2023-07-26 DIAGNOSIS — I6910 Unspecified sequelae of nontraumatic intracerebral hemorrhage: Secondary | ICD-10-CM | POA: Diagnosis not present

## 2023-07-26 DIAGNOSIS — G629 Polyneuropathy, unspecified: Secondary | ICD-10-CM | POA: Diagnosis not present

## 2023-07-26 DIAGNOSIS — G40909 Epilepsy, unspecified, not intractable, without status epilepticus: Secondary | ICD-10-CM | POA: Diagnosis not present

## 2023-07-26 DIAGNOSIS — E46 Unspecified protein-calorie malnutrition: Secondary | ICD-10-CM | POA: Diagnosis not present

## 2023-07-26 DIAGNOSIS — N186 End stage renal disease: Secondary | ICD-10-CM | POA: Diagnosis not present

## 2023-07-26 DIAGNOSIS — F3341 Major depressive disorder, recurrent, in partial remission: Secondary | ICD-10-CM | POA: Diagnosis not present

## 2023-07-29 DIAGNOSIS — I69154 Hemiplegia and hemiparesis following nontraumatic intracerebral hemorrhage affecting left non-dominant side: Secondary | ICD-10-CM | POA: Diagnosis not present

## 2023-07-29 DIAGNOSIS — R1312 Dysphagia, oropharyngeal phase: Secondary | ICD-10-CM | POA: Diagnosis not present

## 2023-08-02 DIAGNOSIS — T17990A Other foreign object in respiratory tract, part unspecified in causing asphyxiation, initial encounter: Secondary | ICD-10-CM | POA: Diagnosis not present

## 2023-08-02 DIAGNOSIS — I959 Hypotension, unspecified: Secondary | ICD-10-CM | POA: Diagnosis not present

## 2023-08-03 DIAGNOSIS — R1312 Dysphagia, oropharyngeal phase: Secondary | ICD-10-CM | POA: Diagnosis not present

## 2023-08-03 DIAGNOSIS — I69154 Hemiplegia and hemiparesis following nontraumatic intracerebral hemorrhage affecting left non-dominant side: Secondary | ICD-10-CM | POA: Diagnosis not present

## 2023-08-04 DIAGNOSIS — I69154 Hemiplegia and hemiparesis following nontraumatic intracerebral hemorrhage affecting left non-dominant side: Secondary | ICD-10-CM | POA: Diagnosis not present

## 2023-08-04 DIAGNOSIS — Z681 Body mass index (BMI) 19 or less, adult: Secondary | ICD-10-CM | POA: Diagnosis not present

## 2023-08-04 DIAGNOSIS — R1312 Dysphagia, oropharyngeal phase: Secondary | ICD-10-CM | POA: Diagnosis not present

## 2023-08-04 DIAGNOSIS — N189 Chronic kidney disease, unspecified: Secondary | ICD-10-CM | POA: Diagnosis not present

## 2023-08-04 DIAGNOSIS — L89896 Pressure-induced deep tissue damage of other site: Secondary | ICD-10-CM | POA: Diagnosis not present

## 2023-08-05 DIAGNOSIS — G8194 Hemiplegia, unspecified affecting left nondominant side: Secondary | ICD-10-CM | POA: Diagnosis not present

## 2023-08-05 DIAGNOSIS — I69154 Hemiplegia and hemiparesis following nontraumatic intracerebral hemorrhage affecting left non-dominant side: Secondary | ICD-10-CM | POA: Diagnosis not present

## 2023-08-05 DIAGNOSIS — G629 Polyneuropathy, unspecified: Secondary | ICD-10-CM | POA: Diagnosis not present

## 2023-08-05 DIAGNOSIS — F3341 Major depressive disorder, recurrent, in partial remission: Secondary | ICD-10-CM | POA: Diagnosis not present

## 2023-08-05 DIAGNOSIS — K219 Gastro-esophageal reflux disease without esophagitis: Secondary | ICD-10-CM | POA: Diagnosis not present

## 2023-08-05 DIAGNOSIS — G40909 Epilepsy, unspecified, not intractable, without status epilepticus: Secondary | ICD-10-CM | POA: Diagnosis not present

## 2023-08-05 DIAGNOSIS — R1312 Dysphagia, oropharyngeal phase: Secondary | ICD-10-CM | POA: Diagnosis not present

## 2023-08-10 DIAGNOSIS — F339 Major depressive disorder, recurrent, unspecified: Secondary | ICD-10-CM | POA: Diagnosis not present

## 2023-08-10 DIAGNOSIS — E213 Hyperparathyroidism, unspecified: Secondary | ICD-10-CM | POA: Diagnosis not present

## 2023-08-10 DIAGNOSIS — Z940 Kidney transplant status: Secondary | ICD-10-CM | POA: Diagnosis not present

## 2023-08-10 DIAGNOSIS — E039 Hypothyroidism, unspecified: Secondary | ICD-10-CM | POA: Diagnosis not present

## 2023-08-10 DIAGNOSIS — G40909 Epilepsy, unspecified, not intractable, without status epilepticus: Secondary | ICD-10-CM | POA: Diagnosis not present

## 2023-08-10 DIAGNOSIS — Z7982 Long term (current) use of aspirin: Secondary | ICD-10-CM | POA: Diagnosis not present

## 2023-08-10 DIAGNOSIS — I69354 Hemiplegia and hemiparesis following cerebral infarction affecting left non-dominant side: Secondary | ICD-10-CM | POA: Diagnosis not present

## 2023-08-10 DIAGNOSIS — R532 Functional quadriplegia: Secondary | ICD-10-CM | POA: Diagnosis not present

## 2023-08-10 DIAGNOSIS — F22 Delusional disorders: Secondary | ICD-10-CM | POA: Diagnosis not present

## 2023-08-11 DIAGNOSIS — I69154 Hemiplegia and hemiparesis following nontraumatic intracerebral hemorrhage affecting left non-dominant side: Secondary | ICD-10-CM | POA: Diagnosis not present

## 2023-08-11 DIAGNOSIS — R1312 Dysphagia, oropharyngeal phase: Secondary | ICD-10-CM | POA: Diagnosis not present

## 2023-08-12 DIAGNOSIS — N186 End stage renal disease: Secondary | ICD-10-CM | POA: Diagnosis not present

## 2023-08-12 DIAGNOSIS — Z79899 Other long term (current) drug therapy: Secondary | ICD-10-CM | POA: Diagnosis not present

## 2023-08-12 DIAGNOSIS — G8194 Hemiplegia, unspecified affecting left nondominant side: Secondary | ICD-10-CM | POA: Diagnosis not present

## 2023-08-12 DIAGNOSIS — F3340 Major depressive disorder, recurrent, in remission, unspecified: Secondary | ICD-10-CM | POA: Diagnosis not present

## 2023-08-12 DIAGNOSIS — E039 Hypothyroidism, unspecified: Secondary | ICD-10-CM | POA: Diagnosis not present

## 2023-08-12 DIAGNOSIS — K219 Gastro-esophageal reflux disease without esophagitis: Secondary | ICD-10-CM | POA: Diagnosis not present

## 2023-08-12 DIAGNOSIS — D84821 Immunodeficiency due to drugs: Secondary | ICD-10-CM | POA: Diagnosis not present

## 2023-08-12 DIAGNOSIS — G40909 Epilepsy, unspecified, not intractable, without status epilepticus: Secondary | ICD-10-CM | POA: Diagnosis not present

## 2023-08-12 DIAGNOSIS — Z940 Kidney transplant status: Secondary | ICD-10-CM | POA: Diagnosis not present

## 2023-08-18 DIAGNOSIS — R1312 Dysphagia, oropharyngeal phase: Secondary | ICD-10-CM | POA: Diagnosis not present

## 2023-08-18 DIAGNOSIS — I69154 Hemiplegia and hemiparesis following nontraumatic intracerebral hemorrhage affecting left non-dominant side: Secondary | ICD-10-CM | POA: Diagnosis not present

## 2023-08-19 DIAGNOSIS — R1312 Dysphagia, oropharyngeal phase: Secondary | ICD-10-CM | POA: Diagnosis not present

## 2023-08-19 DIAGNOSIS — I69154 Hemiplegia and hemiparesis following nontraumatic intracerebral hemorrhage affecting left non-dominant side: Secondary | ICD-10-CM | POA: Diagnosis not present

## 2023-08-20 DIAGNOSIS — I69154 Hemiplegia and hemiparesis following nontraumatic intracerebral hemorrhage affecting left non-dominant side: Secondary | ICD-10-CM | POA: Diagnosis not present

## 2023-08-20 DIAGNOSIS — R1312 Dysphagia, oropharyngeal phase: Secondary | ICD-10-CM | POA: Diagnosis not present

## 2023-08-21 DIAGNOSIS — F22 Delusional disorders: Secondary | ICD-10-CM | POA: Diagnosis not present

## 2023-08-21 DIAGNOSIS — F331 Major depressive disorder, recurrent, moderate: Secondary | ICD-10-CM | POA: Diagnosis not present

## 2023-08-24 DIAGNOSIS — R1312 Dysphagia, oropharyngeal phase: Secondary | ICD-10-CM | POA: Diagnosis not present

## 2023-08-24 DIAGNOSIS — I69154 Hemiplegia and hemiparesis following nontraumatic intracerebral hemorrhage affecting left non-dominant side: Secondary | ICD-10-CM | POA: Diagnosis not present

## 2023-08-25 DIAGNOSIS — I69154 Hemiplegia and hemiparesis following nontraumatic intracerebral hemorrhage affecting left non-dominant side: Secondary | ICD-10-CM | POA: Diagnosis not present

## 2023-08-25 DIAGNOSIS — R1312 Dysphagia, oropharyngeal phase: Secondary | ICD-10-CM | POA: Diagnosis not present

## 2023-08-26 DIAGNOSIS — I69154 Hemiplegia and hemiparesis following nontraumatic intracerebral hemorrhage affecting left non-dominant side: Secondary | ICD-10-CM | POA: Diagnosis not present

## 2023-08-26 DIAGNOSIS — R1312 Dysphagia, oropharyngeal phase: Secondary | ICD-10-CM | POA: Diagnosis not present

## 2023-08-27 DIAGNOSIS — R1312 Dysphagia, oropharyngeal phase: Secondary | ICD-10-CM | POA: Diagnosis not present

## 2023-08-27 DIAGNOSIS — I69154 Hemiplegia and hemiparesis following nontraumatic intracerebral hemorrhage affecting left non-dominant side: Secondary | ICD-10-CM | POA: Diagnosis not present

## 2023-08-30 DIAGNOSIS — R1312 Dysphagia, oropharyngeal phase: Secondary | ICD-10-CM | POA: Diagnosis not present

## 2023-08-30 DIAGNOSIS — I69154 Hemiplegia and hemiparesis following nontraumatic intracerebral hemorrhage affecting left non-dominant side: Secondary | ICD-10-CM | POA: Diagnosis not present

## 2023-08-31 DIAGNOSIS — I69154 Hemiplegia and hemiparesis following nontraumatic intracerebral hemorrhage affecting left non-dominant side: Secondary | ICD-10-CM | POA: Diagnosis not present

## 2023-08-31 DIAGNOSIS — R1312 Dysphagia, oropharyngeal phase: Secondary | ICD-10-CM | POA: Diagnosis not present

## 2023-09-01 DIAGNOSIS — R1312 Dysphagia, oropharyngeal phase: Secondary | ICD-10-CM | POA: Diagnosis not present

## 2023-09-01 DIAGNOSIS — I69154 Hemiplegia and hemiparesis following nontraumatic intracerebral hemorrhage affecting left non-dominant side: Secondary | ICD-10-CM | POA: Diagnosis not present

## 2023-09-02 DIAGNOSIS — R1312 Dysphagia, oropharyngeal phase: Secondary | ICD-10-CM | POA: Diagnosis not present

## 2023-09-02 DIAGNOSIS — I69154 Hemiplegia and hemiparesis following nontraumatic intracerebral hemorrhage affecting left non-dominant side: Secondary | ICD-10-CM | POA: Diagnosis not present

## 2023-09-06 DIAGNOSIS — M6281 Muscle weakness (generalized): Secondary | ICD-10-CM | POA: Diagnosis not present

## 2023-09-06 DIAGNOSIS — R1312 Dysphagia, oropharyngeal phase: Secondary | ICD-10-CM | POA: Diagnosis not present

## 2023-09-06 DIAGNOSIS — I69154 Hemiplegia and hemiparesis following nontraumatic intracerebral hemorrhage affecting left non-dominant side: Secondary | ICD-10-CM | POA: Diagnosis not present

## 2023-09-07 DIAGNOSIS — I69154 Hemiplegia and hemiparesis following nontraumatic intracerebral hemorrhage affecting left non-dominant side: Secondary | ICD-10-CM | POA: Diagnosis not present

## 2023-09-07 DIAGNOSIS — G40909 Epilepsy, unspecified, not intractable, without status epilepticus: Secondary | ICD-10-CM | POA: Diagnosis not present

## 2023-09-07 DIAGNOSIS — R532 Functional quadriplegia: Secondary | ICD-10-CM | POA: Diagnosis not present

## 2023-09-07 DIAGNOSIS — R1312 Dysphagia, oropharyngeal phase: Secondary | ICD-10-CM | POA: Diagnosis not present

## 2023-09-07 DIAGNOSIS — G629 Polyneuropathy, unspecified: Secondary | ICD-10-CM | POA: Diagnosis not present

## 2023-09-07 DIAGNOSIS — M6281 Muscle weakness (generalized): Secondary | ICD-10-CM | POA: Diagnosis not present

## 2023-09-07 DIAGNOSIS — F339 Major depressive disorder, recurrent, unspecified: Secondary | ICD-10-CM | POA: Diagnosis not present

## 2023-09-10 DIAGNOSIS — F331 Major depressive disorder, recurrent, moderate: Secondary | ICD-10-CM | POA: Diagnosis not present

## 2023-09-10 DIAGNOSIS — F22 Delusional disorders: Secondary | ICD-10-CM | POA: Diagnosis not present

## 2023-09-10 DIAGNOSIS — R1312 Dysphagia, oropharyngeal phase: Secondary | ICD-10-CM | POA: Diagnosis not present

## 2023-09-10 DIAGNOSIS — I69154 Hemiplegia and hemiparesis following nontraumatic intracerebral hemorrhage affecting left non-dominant side: Secondary | ICD-10-CM | POA: Diagnosis not present

## 2023-09-10 DIAGNOSIS — M6281 Muscle weakness (generalized): Secondary | ICD-10-CM | POA: Diagnosis not present

## 2023-09-13 DIAGNOSIS — M6281 Muscle weakness (generalized): Secondary | ICD-10-CM | POA: Diagnosis not present

## 2023-09-13 DIAGNOSIS — R1312 Dysphagia, oropharyngeal phase: Secondary | ICD-10-CM | POA: Diagnosis not present

## 2023-09-13 DIAGNOSIS — I69154 Hemiplegia and hemiparesis following nontraumatic intracerebral hemorrhage affecting left non-dominant side: Secondary | ICD-10-CM | POA: Diagnosis not present

## 2023-09-15 DIAGNOSIS — R1312 Dysphagia, oropharyngeal phase: Secondary | ICD-10-CM | POA: Diagnosis not present

## 2023-09-15 DIAGNOSIS — M6281 Muscle weakness (generalized): Secondary | ICD-10-CM | POA: Diagnosis not present

## 2023-09-15 DIAGNOSIS — I69154 Hemiplegia and hemiparesis following nontraumatic intracerebral hemorrhage affecting left non-dominant side: Secondary | ICD-10-CM | POA: Diagnosis not present

## 2023-09-17 DIAGNOSIS — I69154 Hemiplegia and hemiparesis following nontraumatic intracerebral hemorrhage affecting left non-dominant side: Secondary | ICD-10-CM | POA: Diagnosis not present

## 2023-09-17 DIAGNOSIS — R1312 Dysphagia, oropharyngeal phase: Secondary | ICD-10-CM | POA: Diagnosis not present

## 2023-09-17 DIAGNOSIS — M6281 Muscle weakness (generalized): Secondary | ICD-10-CM | POA: Diagnosis not present

## 2023-09-20 DIAGNOSIS — R1312 Dysphagia, oropharyngeal phase: Secondary | ICD-10-CM | POA: Diagnosis not present

## 2023-09-20 DIAGNOSIS — M6281 Muscle weakness (generalized): Secondary | ICD-10-CM | POA: Diagnosis not present

## 2023-09-20 DIAGNOSIS — I69154 Hemiplegia and hemiparesis following nontraumatic intracerebral hemorrhage affecting left non-dominant side: Secondary | ICD-10-CM | POA: Diagnosis not present

## 2023-09-22 DIAGNOSIS — R1312 Dysphagia, oropharyngeal phase: Secondary | ICD-10-CM | POA: Diagnosis not present

## 2023-09-22 DIAGNOSIS — I69154 Hemiplegia and hemiparesis following nontraumatic intracerebral hemorrhage affecting left non-dominant side: Secondary | ICD-10-CM | POA: Diagnosis not present

## 2023-09-22 DIAGNOSIS — M6281 Muscle weakness (generalized): Secondary | ICD-10-CM | POA: Diagnosis not present

## 2023-09-24 DIAGNOSIS — R1312 Dysphagia, oropharyngeal phase: Secondary | ICD-10-CM | POA: Diagnosis not present

## 2023-09-24 DIAGNOSIS — M6281 Muscle weakness (generalized): Secondary | ICD-10-CM | POA: Diagnosis not present

## 2023-09-24 DIAGNOSIS — I69154 Hemiplegia and hemiparesis following nontraumatic intracerebral hemorrhage affecting left non-dominant side: Secondary | ICD-10-CM | POA: Diagnosis not present

## 2023-09-27 DIAGNOSIS — R1312 Dysphagia, oropharyngeal phase: Secondary | ICD-10-CM | POA: Diagnosis not present

## 2023-09-27 DIAGNOSIS — M6281 Muscle weakness (generalized): Secondary | ICD-10-CM | POA: Diagnosis not present

## 2023-09-27 DIAGNOSIS — I69154 Hemiplegia and hemiparesis following nontraumatic intracerebral hemorrhage affecting left non-dominant side: Secondary | ICD-10-CM | POA: Diagnosis not present

## 2023-09-28 DIAGNOSIS — R1312 Dysphagia, oropharyngeal phase: Secondary | ICD-10-CM | POA: Diagnosis not present

## 2023-09-28 DIAGNOSIS — M6281 Muscle weakness (generalized): Secondary | ICD-10-CM | POA: Diagnosis not present

## 2023-09-28 DIAGNOSIS — I69154 Hemiplegia and hemiparesis following nontraumatic intracerebral hemorrhage affecting left non-dominant side: Secondary | ICD-10-CM | POA: Diagnosis not present

## 2023-09-29 DIAGNOSIS — R0989 Other specified symptoms and signs involving the circulatory and respiratory systems: Secondary | ICD-10-CM | POA: Diagnosis not present

## 2023-09-29 DIAGNOSIS — R0600 Dyspnea, unspecified: Secondary | ICD-10-CM | POA: Diagnosis not present

## 2023-09-29 DIAGNOSIS — R600 Localized edema: Secondary | ICD-10-CM | POA: Diagnosis not present

## 2023-09-29 DIAGNOSIS — R0689 Other abnormalities of breathing: Secondary | ICD-10-CM | POA: Diagnosis not present

## 2023-09-29 DIAGNOSIS — R531 Weakness: Secondary | ICD-10-CM | POA: Diagnosis not present

## 2023-09-29 DIAGNOSIS — Z9981 Dependence on supplemental oxygen: Secondary | ICD-10-CM | POA: Diagnosis not present

## 2023-09-29 DIAGNOSIS — M7989 Other specified soft tissue disorders: Secondary | ICD-10-CM | POA: Diagnosis not present

## 2023-09-29 DIAGNOSIS — Z87891 Personal history of nicotine dependence: Secondary | ICD-10-CM | POA: Diagnosis not present

## 2023-09-29 DIAGNOSIS — I10 Essential (primary) hypertension: Secondary | ICD-10-CM | POA: Diagnosis not present

## 2023-09-29 DIAGNOSIS — R0602 Shortness of breath: Secondary | ICD-10-CM | POA: Diagnosis not present

## 2023-09-30 DIAGNOSIS — M6281 Muscle weakness (generalized): Secondary | ICD-10-CM | POA: Diagnosis not present

## 2023-09-30 DIAGNOSIS — G8194 Hemiplegia, unspecified affecting left nondominant side: Secondary | ICD-10-CM | POA: Diagnosis not present

## 2023-09-30 DIAGNOSIS — Z79899 Other long term (current) drug therapy: Secondary | ICD-10-CM | POA: Diagnosis not present

## 2023-09-30 DIAGNOSIS — N186 End stage renal disease: Secondary | ICD-10-CM | POA: Diagnosis not present

## 2023-09-30 DIAGNOSIS — D84821 Immunodeficiency due to drugs: Secondary | ICD-10-CM | POA: Diagnosis not present

## 2023-09-30 DIAGNOSIS — I69154 Hemiplegia and hemiparesis following nontraumatic intracerebral hemorrhage affecting left non-dominant side: Secondary | ICD-10-CM | POA: Diagnosis not present

## 2023-09-30 DIAGNOSIS — G40909 Epilepsy, unspecified, not intractable, without status epilepticus: Secondary | ICD-10-CM | POA: Diagnosis not present

## 2023-09-30 DIAGNOSIS — Z940 Kidney transplant status: Secondary | ICD-10-CM | POA: Diagnosis not present

## 2023-09-30 DIAGNOSIS — R1312 Dysphagia, oropharyngeal phase: Secondary | ICD-10-CM | POA: Diagnosis not present

## 2023-09-30 DIAGNOSIS — F3340 Major depressive disorder, recurrent, in remission, unspecified: Secondary | ICD-10-CM | POA: Diagnosis not present

## 2023-10-01 DIAGNOSIS — M6281 Muscle weakness (generalized): Secondary | ICD-10-CM | POA: Diagnosis not present

## 2023-10-01 DIAGNOSIS — R1312 Dysphagia, oropharyngeal phase: Secondary | ICD-10-CM | POA: Diagnosis not present

## 2023-10-01 DIAGNOSIS — I69154 Hemiplegia and hemiparesis following nontraumatic intracerebral hemorrhage affecting left non-dominant side: Secondary | ICD-10-CM | POA: Diagnosis not present

## 2023-10-04 DIAGNOSIS — R1312 Dysphagia, oropharyngeal phase: Secondary | ICD-10-CM | POA: Diagnosis not present

## 2023-10-04 DIAGNOSIS — I69154 Hemiplegia and hemiparesis following nontraumatic intracerebral hemorrhage affecting left non-dominant side: Secondary | ICD-10-CM | POA: Diagnosis not present

## 2023-10-04 DIAGNOSIS — M6281 Muscle weakness (generalized): Secondary | ICD-10-CM | POA: Diagnosis not present

## 2023-10-05 DIAGNOSIS — I69154 Hemiplegia and hemiparesis following nontraumatic intracerebral hemorrhage affecting left non-dominant side: Secondary | ICD-10-CM | POA: Diagnosis not present

## 2023-10-05 DIAGNOSIS — M6281 Muscle weakness (generalized): Secondary | ICD-10-CM | POA: Diagnosis not present

## 2023-10-06 DIAGNOSIS — M6281 Muscle weakness (generalized): Secondary | ICD-10-CM | POA: Diagnosis not present

## 2023-10-06 DIAGNOSIS — I69154 Hemiplegia and hemiparesis following nontraumatic intracerebral hemorrhage affecting left non-dominant side: Secondary | ICD-10-CM | POA: Diagnosis not present

## 2023-10-07 DIAGNOSIS — M6281 Muscle weakness (generalized): Secondary | ICD-10-CM | POA: Diagnosis not present

## 2023-10-07 DIAGNOSIS — I69154 Hemiplegia and hemiparesis following nontraumatic intracerebral hemorrhage affecting left non-dominant side: Secondary | ICD-10-CM | POA: Diagnosis not present

## 2023-10-08 DIAGNOSIS — I69154 Hemiplegia and hemiparesis following nontraumatic intracerebral hemorrhage affecting left non-dominant side: Secondary | ICD-10-CM | POA: Diagnosis not present

## 2023-10-08 DIAGNOSIS — M6281 Muscle weakness (generalized): Secondary | ICD-10-CM | POA: Diagnosis not present

## 2023-10-11 DIAGNOSIS — M6281 Muscle weakness (generalized): Secondary | ICD-10-CM | POA: Diagnosis not present

## 2023-10-11 DIAGNOSIS — I69154 Hemiplegia and hemiparesis following nontraumatic intracerebral hemorrhage affecting left non-dominant side: Secondary | ICD-10-CM | POA: Diagnosis not present

## 2023-10-12 DIAGNOSIS — I69154 Hemiplegia and hemiparesis following nontraumatic intracerebral hemorrhage affecting left non-dominant side: Secondary | ICD-10-CM | POA: Diagnosis not present

## 2023-10-12 DIAGNOSIS — M6281 Muscle weakness (generalized): Secondary | ICD-10-CM | POA: Diagnosis not present

## 2023-10-13 DIAGNOSIS — I69154 Hemiplegia and hemiparesis following nontraumatic intracerebral hemorrhage affecting left non-dominant side: Secondary | ICD-10-CM | POA: Diagnosis not present

## 2023-10-13 DIAGNOSIS — M6281 Muscle weakness (generalized): Secondary | ICD-10-CM | POA: Diagnosis not present

## 2023-10-14 DIAGNOSIS — Z940 Kidney transplant status: Secondary | ICD-10-CM | POA: Diagnosis not present

## 2023-10-14 DIAGNOSIS — G40909 Epilepsy, unspecified, not intractable, without status epilepticus: Secondary | ICD-10-CM | POA: Diagnosis not present

## 2023-10-14 DIAGNOSIS — T50905D Adverse effect of unspecified drugs, medicaments and biological substances, subsequent encounter: Secondary | ICD-10-CM | POA: Diagnosis not present

## 2023-10-14 DIAGNOSIS — F3340 Major depressive disorder, recurrent, in remission, unspecified: Secondary | ICD-10-CM | POA: Diagnosis not present

## 2023-10-14 DIAGNOSIS — I69154 Hemiplegia and hemiparesis following nontraumatic intracerebral hemorrhage affecting left non-dominant side: Secondary | ICD-10-CM | POA: Diagnosis not present

## 2023-10-14 DIAGNOSIS — Z79899 Other long term (current) drug therapy: Secondary | ICD-10-CM | POA: Diagnosis not present

## 2023-10-14 DIAGNOSIS — M6281 Muscle weakness (generalized): Secondary | ICD-10-CM | POA: Diagnosis not present

## 2023-10-14 DIAGNOSIS — N186 End stage renal disease: Secondary | ICD-10-CM | POA: Diagnosis not present

## 2023-10-14 DIAGNOSIS — G8194 Hemiplegia, unspecified affecting left nondominant side: Secondary | ICD-10-CM | POA: Diagnosis not present

## 2023-10-14 DIAGNOSIS — D84821 Immunodeficiency due to drugs: Secondary | ICD-10-CM | POA: Diagnosis not present

## 2023-10-18 DIAGNOSIS — I69154 Hemiplegia and hemiparesis following nontraumatic intracerebral hemorrhage affecting left non-dominant side: Secondary | ICD-10-CM | POA: Diagnosis not present

## 2023-10-18 DIAGNOSIS — M6281 Muscle weakness (generalized): Secondary | ICD-10-CM | POA: Diagnosis not present

## 2023-10-19 DIAGNOSIS — M6281 Muscle weakness (generalized): Secondary | ICD-10-CM | POA: Diagnosis not present

## 2023-10-19 DIAGNOSIS — Z79899 Other long term (current) drug therapy: Secondary | ICD-10-CM | POA: Diagnosis not present

## 2023-10-19 DIAGNOSIS — I69154 Hemiplegia and hemiparesis following nontraumatic intracerebral hemorrhage affecting left non-dominant side: Secondary | ICD-10-CM | POA: Diagnosis not present

## 2023-10-20 DIAGNOSIS — I69154 Hemiplegia and hemiparesis following nontraumatic intracerebral hemorrhage affecting left non-dominant side: Secondary | ICD-10-CM | POA: Diagnosis not present

## 2023-10-20 DIAGNOSIS — M6281 Muscle weakness (generalized): Secondary | ICD-10-CM | POA: Diagnosis not present

## 2023-10-21 DIAGNOSIS — I69154 Hemiplegia and hemiparesis following nontraumatic intracerebral hemorrhage affecting left non-dominant side: Secondary | ICD-10-CM | POA: Diagnosis not present

## 2023-10-21 DIAGNOSIS — M6281 Muscle weakness (generalized): Secondary | ICD-10-CM | POA: Diagnosis not present

## 2023-10-22 DIAGNOSIS — I69154 Hemiplegia and hemiparesis following nontraumatic intracerebral hemorrhage affecting left non-dominant side: Secondary | ICD-10-CM | POA: Diagnosis not present

## 2023-10-22 DIAGNOSIS — F22 Delusional disorders: Secondary | ICD-10-CM | POA: Diagnosis not present

## 2023-10-22 DIAGNOSIS — F331 Major depressive disorder, recurrent, moderate: Secondary | ICD-10-CM | POA: Diagnosis not present

## 2023-10-22 DIAGNOSIS — M6281 Muscle weakness (generalized): Secondary | ICD-10-CM | POA: Diagnosis not present

## 2023-10-25 DIAGNOSIS — M6281 Muscle weakness (generalized): Secondary | ICD-10-CM | POA: Diagnosis not present

## 2023-10-25 DIAGNOSIS — I69154 Hemiplegia and hemiparesis following nontraumatic intracerebral hemorrhage affecting left non-dominant side: Secondary | ICD-10-CM | POA: Diagnosis not present

## 2023-10-26 DIAGNOSIS — I69154 Hemiplegia and hemiparesis following nontraumatic intracerebral hemorrhage affecting left non-dominant side: Secondary | ICD-10-CM | POA: Diagnosis not present

## 2023-10-26 DIAGNOSIS — M6281 Muscle weakness (generalized): Secondary | ICD-10-CM | POA: Diagnosis not present

## 2023-10-27 DIAGNOSIS — I69154 Hemiplegia and hemiparesis following nontraumatic intracerebral hemorrhage affecting left non-dominant side: Secondary | ICD-10-CM | POA: Diagnosis not present

## 2023-10-27 DIAGNOSIS — M6281 Muscle weakness (generalized): Secondary | ICD-10-CM | POA: Diagnosis not present

## 2023-10-28 DIAGNOSIS — I69154 Hemiplegia and hemiparesis following nontraumatic intracerebral hemorrhage affecting left non-dominant side: Secondary | ICD-10-CM | POA: Diagnosis not present

## 2023-10-28 DIAGNOSIS — M6281 Muscle weakness (generalized): Secondary | ICD-10-CM | POA: Diagnosis not present

## 2023-10-31 DIAGNOSIS — F331 Major depressive disorder, recurrent, moderate: Secondary | ICD-10-CM | POA: Diagnosis not present

## 2023-10-31 DIAGNOSIS — F22 Delusional disorders: Secondary | ICD-10-CM | POA: Diagnosis not present

## 2023-11-07 DIAGNOSIS — F22 Delusional disorders: Secondary | ICD-10-CM | POA: Diagnosis not present

## 2023-11-07 DIAGNOSIS — F331 Major depressive disorder, recurrent, moderate: Secondary | ICD-10-CM | POA: Diagnosis not present

## 2023-11-18 DIAGNOSIS — N186 End stage renal disease: Secondary | ICD-10-CM | POA: Diagnosis not present

## 2023-11-18 DIAGNOSIS — G8194 Hemiplegia, unspecified affecting left nondominant side: Secondary | ICD-10-CM | POA: Diagnosis not present

## 2023-11-18 DIAGNOSIS — F3340 Major depressive disorder, recurrent, in remission, unspecified: Secondary | ICD-10-CM | POA: Diagnosis not present

## 2023-11-18 DIAGNOSIS — T50905D Adverse effect of unspecified drugs, medicaments and biological substances, subsequent encounter: Secondary | ICD-10-CM | POA: Diagnosis not present

## 2023-11-18 DIAGNOSIS — G40909 Epilepsy, unspecified, not intractable, without status epilepticus: Secondary | ICD-10-CM | POA: Diagnosis not present

## 2023-11-18 DIAGNOSIS — D84821 Immunodeficiency due to drugs: Secondary | ICD-10-CM | POA: Diagnosis not present

## 2023-11-18 DIAGNOSIS — Z940 Kidney transplant status: Secondary | ICD-10-CM | POA: Diagnosis not present

## 2023-11-19 DIAGNOSIS — F331 Major depressive disorder, recurrent, moderate: Secondary | ICD-10-CM | POA: Diagnosis not present

## 2023-11-19 DIAGNOSIS — F22 Delusional disorders: Secondary | ICD-10-CM | POA: Diagnosis not present

## 2023-12-10 DIAGNOSIS — Z79899 Other long term (current) drug therapy: Secondary | ICD-10-CM | POA: Diagnosis not present

## 2023-12-10 DIAGNOSIS — R532 Functional quadriplegia: Secondary | ICD-10-CM | POA: Diagnosis not present

## 2023-12-10 DIAGNOSIS — K5909 Other constipation: Secondary | ICD-10-CM | POA: Diagnosis not present

## 2023-12-10 DIAGNOSIS — G40909 Epilepsy, unspecified, not intractable, without status epilepticus: Secondary | ICD-10-CM | POA: Diagnosis not present

## 2023-12-10 DIAGNOSIS — E039 Hypothyroidism, unspecified: Secondary | ICD-10-CM | POA: Diagnosis not present

## 2023-12-21 DIAGNOSIS — Z961 Presence of intraocular lens: Secondary | ICD-10-CM | POA: Diagnosis not present

## 2023-12-21 DIAGNOSIS — H04123 Dry eye syndrome of bilateral lacrimal glands: Secondary | ICD-10-CM | POA: Diagnosis not present

## 2023-12-21 DIAGNOSIS — I10 Essential (primary) hypertension: Secondary | ICD-10-CM | POA: Diagnosis not present

## 2023-12-28 DIAGNOSIS — R062 Wheezing: Secondary | ICD-10-CM | POA: Diagnosis not present

## 2023-12-28 DIAGNOSIS — F331 Major depressive disorder, recurrent, moderate: Secondary | ICD-10-CM | POA: Diagnosis not present

## 2023-12-28 DIAGNOSIS — R059 Cough, unspecified: Secondary | ICD-10-CM | POA: Diagnosis not present

## 2023-12-28 DIAGNOSIS — F22 Delusional disorders: Secondary | ICD-10-CM | POA: Diagnosis not present

## 2023-12-28 DIAGNOSIS — R058 Other specified cough: Secondary | ICD-10-CM | POA: Diagnosis not present

## 2024-01-09 DIAGNOSIS — F22 Delusional disorders: Secondary | ICD-10-CM | POA: Diagnosis not present

## 2024-01-09 DIAGNOSIS — F331 Major depressive disorder, recurrent, moderate: Secondary | ICD-10-CM | POA: Diagnosis not present

## 2024-01-11 DIAGNOSIS — M6281 Muscle weakness (generalized): Secondary | ICD-10-CM | POA: Diagnosis not present

## 2024-01-11 DIAGNOSIS — G629 Polyneuropathy, unspecified: Secondary | ICD-10-CM | POA: Diagnosis not present

## 2024-01-11 DIAGNOSIS — R532 Functional quadriplegia: Secondary | ICD-10-CM | POA: Diagnosis not present

## 2024-01-11 DIAGNOSIS — F339 Major depressive disorder, recurrent, unspecified: Secondary | ICD-10-CM | POA: Diagnosis not present

## 2024-01-11 DIAGNOSIS — I69154 Hemiplegia and hemiparesis following nontraumatic intracerebral hemorrhage affecting left non-dominant side: Secondary | ICD-10-CM | POA: Diagnosis not present

## 2024-01-11 DIAGNOSIS — G40909 Epilepsy, unspecified, not intractable, without status epilepticus: Secondary | ICD-10-CM | POA: Diagnosis not present

## 2024-01-12 DIAGNOSIS — I739 Peripheral vascular disease, unspecified: Secondary | ICD-10-CM | POA: Diagnosis not present

## 2024-01-12 DIAGNOSIS — I7389 Other specified peripheral vascular diseases: Secondary | ICD-10-CM | POA: Diagnosis not present

## 2024-01-12 DIAGNOSIS — L603 Nail dystrophy: Secondary | ICD-10-CM | POA: Diagnosis not present

## 2024-01-12 DIAGNOSIS — B351 Tinea unguium: Secondary | ICD-10-CM | POA: Diagnosis not present

## 2024-01-12 DIAGNOSIS — F331 Major depressive disorder, recurrent, moderate: Secondary | ICD-10-CM | POA: Diagnosis not present

## 2024-01-12 DIAGNOSIS — F22 Delusional disorders: Secondary | ICD-10-CM | POA: Diagnosis not present

## 2024-01-14 DIAGNOSIS — M6281 Muscle weakness (generalized): Secondary | ICD-10-CM | POA: Diagnosis not present

## 2024-01-14 DIAGNOSIS — I69154 Hemiplegia and hemiparesis following nontraumatic intracerebral hemorrhage affecting left non-dominant side: Secondary | ICD-10-CM | POA: Diagnosis not present

## 2024-01-16 DIAGNOSIS — F22 Delusional disorders: Secondary | ICD-10-CM | POA: Diagnosis not present

## 2024-01-16 DIAGNOSIS — F331 Major depressive disorder, recurrent, moderate: Secondary | ICD-10-CM | POA: Diagnosis not present

## 2024-01-17 DIAGNOSIS — N186 End stage renal disease: Secondary | ICD-10-CM | POA: Diagnosis not present

## 2024-01-17 DIAGNOSIS — G8194 Hemiplegia, unspecified affecting left nondominant side: Secondary | ICD-10-CM | POA: Diagnosis not present

## 2024-01-17 DIAGNOSIS — F3340 Major depressive disorder, recurrent, in remission, unspecified: Secondary | ICD-10-CM | POA: Diagnosis not present

## 2024-01-17 DIAGNOSIS — T50905D Adverse effect of unspecified drugs, medicaments and biological substances, subsequent encounter: Secondary | ICD-10-CM | POA: Diagnosis not present

## 2024-01-17 DIAGNOSIS — Z940 Kidney transplant status: Secondary | ICD-10-CM | POA: Diagnosis not present

## 2024-01-17 DIAGNOSIS — D84821 Immunodeficiency due to drugs: Secondary | ICD-10-CM | POA: Diagnosis not present

## 2024-01-17 DIAGNOSIS — G40909 Epilepsy, unspecified, not intractable, without status epilepticus: Secondary | ICD-10-CM | POA: Diagnosis not present

## 2024-01-17 DIAGNOSIS — M6281 Muscle weakness (generalized): Secondary | ICD-10-CM | POA: Diagnosis not present

## 2024-01-17 DIAGNOSIS — I69154 Hemiplegia and hemiparesis following nontraumatic intracerebral hemorrhage affecting left non-dominant side: Secondary | ICD-10-CM | POA: Diagnosis not present

## 2024-01-18 DIAGNOSIS — M6281 Muscle weakness (generalized): Secondary | ICD-10-CM | POA: Diagnosis not present

## 2024-01-18 DIAGNOSIS — I69154 Hemiplegia and hemiparesis following nontraumatic intracerebral hemorrhage affecting left non-dominant side: Secondary | ICD-10-CM | POA: Diagnosis not present

## 2024-01-19 DIAGNOSIS — I69154 Hemiplegia and hemiparesis following nontraumatic intracerebral hemorrhage affecting left non-dominant side: Secondary | ICD-10-CM | POA: Diagnosis not present

## 2024-01-19 DIAGNOSIS — M6281 Muscle weakness (generalized): Secondary | ICD-10-CM | POA: Diagnosis not present

## 2024-01-20 DIAGNOSIS — M6281 Muscle weakness (generalized): Secondary | ICD-10-CM | POA: Diagnosis not present

## 2024-01-20 DIAGNOSIS — I69154 Hemiplegia and hemiparesis following nontraumatic intracerebral hemorrhage affecting left non-dominant side: Secondary | ICD-10-CM | POA: Diagnosis not present

## 2024-01-21 DIAGNOSIS — M6281 Muscle weakness (generalized): Secondary | ICD-10-CM | POA: Diagnosis not present

## 2024-01-21 DIAGNOSIS — I69154 Hemiplegia and hemiparesis following nontraumatic intracerebral hemorrhage affecting left non-dominant side: Secondary | ICD-10-CM | POA: Diagnosis not present

## 2024-01-24 DIAGNOSIS — I69154 Hemiplegia and hemiparesis following nontraumatic intracerebral hemorrhage affecting left non-dominant side: Secondary | ICD-10-CM | POA: Diagnosis not present

## 2024-01-24 DIAGNOSIS — M6281 Muscle weakness (generalized): Secondary | ICD-10-CM | POA: Diagnosis not present

## 2024-01-25 DIAGNOSIS — M6281 Muscle weakness (generalized): Secondary | ICD-10-CM | POA: Diagnosis not present

## 2024-01-25 DIAGNOSIS — I69154 Hemiplegia and hemiparesis following nontraumatic intracerebral hemorrhage affecting left non-dominant side: Secondary | ICD-10-CM | POA: Diagnosis not present

## 2024-01-26 DIAGNOSIS — M6281 Muscle weakness (generalized): Secondary | ICD-10-CM | POA: Diagnosis not present

## 2024-01-26 DIAGNOSIS — I69154 Hemiplegia and hemiparesis following nontraumatic intracerebral hemorrhage affecting left non-dominant side: Secondary | ICD-10-CM | POA: Diagnosis not present

## 2024-01-27 DIAGNOSIS — I69154 Hemiplegia and hemiparesis following nontraumatic intracerebral hemorrhage affecting left non-dominant side: Secondary | ICD-10-CM | POA: Diagnosis not present

## 2024-01-27 DIAGNOSIS — M6281 Muscle weakness (generalized): Secondary | ICD-10-CM | POA: Diagnosis not present

## 2024-01-28 DIAGNOSIS — I69154 Hemiplegia and hemiparesis following nontraumatic intracerebral hemorrhage affecting left non-dominant side: Secondary | ICD-10-CM | POA: Diagnosis not present

## 2024-01-28 DIAGNOSIS — M6281 Muscle weakness (generalized): Secondary | ICD-10-CM | POA: Diagnosis not present

## 2024-01-29 DIAGNOSIS — F22 Delusional disorders: Secondary | ICD-10-CM | POA: Diagnosis not present

## 2024-01-29 DIAGNOSIS — F331 Major depressive disorder, recurrent, moderate: Secondary | ICD-10-CM | POA: Diagnosis not present

## 2024-01-31 DIAGNOSIS — M6281 Muscle weakness (generalized): Secondary | ICD-10-CM | POA: Diagnosis not present

## 2024-01-31 DIAGNOSIS — I69154 Hemiplegia and hemiparesis following nontraumatic intracerebral hemorrhage affecting left non-dominant side: Secondary | ICD-10-CM | POA: Diagnosis not present

## 2024-02-02 DIAGNOSIS — I69154 Hemiplegia and hemiparesis following nontraumatic intracerebral hemorrhage affecting left non-dominant side: Secondary | ICD-10-CM | POA: Diagnosis not present

## 2024-02-02 DIAGNOSIS — M6281 Muscle weakness (generalized): Secondary | ICD-10-CM | POA: Diagnosis not present

## 2024-02-03 DIAGNOSIS — F3340 Major depressive disorder, recurrent, in remission, unspecified: Secondary | ICD-10-CM | POA: Diagnosis not present

## 2024-02-03 DIAGNOSIS — D84821 Immunodeficiency due to drugs: Secondary | ICD-10-CM | POA: Diagnosis not present

## 2024-02-03 DIAGNOSIS — G40909 Epilepsy, unspecified, not intractable, without status epilepticus: Secondary | ICD-10-CM | POA: Diagnosis not present

## 2024-02-03 DIAGNOSIS — G8194 Hemiplegia, unspecified affecting left nondominant side: Secondary | ICD-10-CM | POA: Diagnosis not present

## 2024-02-03 DIAGNOSIS — N186 End stage renal disease: Secondary | ICD-10-CM | POA: Diagnosis not present

## 2024-02-03 DIAGNOSIS — T50905D Adverse effect of unspecified drugs, medicaments and biological substances, subsequent encounter: Secondary | ICD-10-CM | POA: Diagnosis not present

## 2024-02-03 DIAGNOSIS — Z940 Kidney transplant status: Secondary | ICD-10-CM | POA: Diagnosis not present

## 2024-02-03 DIAGNOSIS — I69154 Hemiplegia and hemiparesis following nontraumatic intracerebral hemorrhage affecting left non-dominant side: Secondary | ICD-10-CM | POA: Diagnosis not present

## 2024-02-03 DIAGNOSIS — M6281 Muscle weakness (generalized): Secondary | ICD-10-CM | POA: Diagnosis not present

## 2024-02-04 ENCOUNTER — Telehealth: Payer: Self-pay

## 2024-02-04 DIAGNOSIS — M6281 Muscle weakness (generalized): Secondary | ICD-10-CM | POA: Diagnosis not present

## 2024-02-04 DIAGNOSIS — I69154 Hemiplegia and hemiparesis following nontraumatic intracerebral hemorrhage affecting left non-dominant side: Secondary | ICD-10-CM | POA: Diagnosis not present

## 2024-02-04 NOTE — Telephone Encounter (Signed)
 Patient was identified as falling into the [place] Measure - Diabetes.   Patient was: Attribution and/or data issue.  Validation/Investigation needed.  Explanation:  Patient not established with [facility].

## 2024-02-07 DIAGNOSIS — M6281 Muscle weakness (generalized): Secondary | ICD-10-CM | POA: Diagnosis not present

## 2024-02-07 DIAGNOSIS — Z940 Kidney transplant status: Secondary | ICD-10-CM | POA: Diagnosis not present

## 2024-02-07 DIAGNOSIS — Z992 Dependence on renal dialysis: Secondary | ICD-10-CM | POA: Diagnosis not present

## 2024-02-07 DIAGNOSIS — I69154 Hemiplegia and hemiparesis following nontraumatic intracerebral hemorrhage affecting left non-dominant side: Secondary | ICD-10-CM | POA: Diagnosis not present

## 2024-02-07 DIAGNOSIS — F3340 Major depressive disorder, recurrent, in remission, unspecified: Secondary | ICD-10-CM | POA: Diagnosis not present

## 2024-02-07 DIAGNOSIS — D84821 Immunodeficiency due to drugs: Secondary | ICD-10-CM | POA: Diagnosis not present

## 2024-02-07 DIAGNOSIS — T50905D Adverse effect of unspecified drugs, medicaments and biological substances, subsequent encounter: Secondary | ICD-10-CM | POA: Diagnosis not present

## 2024-02-07 DIAGNOSIS — G40909 Epilepsy, unspecified, not intractable, without status epilepticus: Secondary | ICD-10-CM | POA: Diagnosis not present

## 2024-02-07 DIAGNOSIS — G8194 Hemiplegia, unspecified affecting left nondominant side: Secondary | ICD-10-CM | POA: Diagnosis not present

## 2024-02-07 DIAGNOSIS — Z5320 Procedure and treatment not carried out because of patient's decision for unspecified reasons: Secondary | ICD-10-CM | POA: Diagnosis not present

## 2024-02-07 DIAGNOSIS — N186 End stage renal disease: Secondary | ICD-10-CM | POA: Diagnosis not present

## 2024-02-08 DIAGNOSIS — M6281 Muscle weakness (generalized): Secondary | ICD-10-CM | POA: Diagnosis not present

## 2024-02-08 DIAGNOSIS — I69154 Hemiplegia and hemiparesis following nontraumatic intracerebral hemorrhage affecting left non-dominant side: Secondary | ICD-10-CM | POA: Diagnosis not present

## 2024-02-09 DIAGNOSIS — I69154 Hemiplegia and hemiparesis following nontraumatic intracerebral hemorrhage affecting left non-dominant side: Secondary | ICD-10-CM | POA: Diagnosis not present

## 2024-02-09 DIAGNOSIS — M6281 Muscle weakness (generalized): Secondary | ICD-10-CM | POA: Diagnosis not present

## 2024-02-18 DIAGNOSIS — Z992 Dependence on renal dialysis: Secondary | ICD-10-CM | POA: Diagnosis not present

## 2024-02-18 DIAGNOSIS — I69354 Hemiplegia and hemiparesis following cerebral infarction affecting left non-dominant side: Secondary | ICD-10-CM | POA: Diagnosis not present

## 2024-02-18 DIAGNOSIS — F3340 Major depressive disorder, recurrent, in remission, unspecified: Secondary | ICD-10-CM | POA: Diagnosis not present

## 2024-02-18 DIAGNOSIS — Z940 Kidney transplant status: Secondary | ICD-10-CM | POA: Diagnosis not present

## 2024-02-18 DIAGNOSIS — R532 Functional quadriplegia: Secondary | ICD-10-CM | POA: Diagnosis not present

## 2024-02-18 DIAGNOSIS — K5909 Other constipation: Secondary | ICD-10-CM | POA: Diagnosis not present

## 2024-02-18 DIAGNOSIS — Z7982 Long term (current) use of aspirin: Secondary | ICD-10-CM | POA: Diagnosis not present

## 2024-02-18 DIAGNOSIS — G40909 Epilepsy, unspecified, not intractable, without status epilepticus: Secondary | ICD-10-CM | POA: Diagnosis not present

## 2024-02-18 DIAGNOSIS — N186 End stage renal disease: Secondary | ICD-10-CM | POA: Diagnosis not present

## 2024-02-29 DIAGNOSIS — N186 End stage renal disease: Secondary | ICD-10-CM | POA: Diagnosis not present

## 2024-02-29 DIAGNOSIS — G40909 Epilepsy, unspecified, not intractable, without status epilepticus: Secondary | ICD-10-CM | POA: Diagnosis not present

## 2024-02-29 DIAGNOSIS — D84821 Immunodeficiency due to drugs: Secondary | ICD-10-CM | POA: Diagnosis not present

## 2024-02-29 DIAGNOSIS — Z940 Kidney transplant status: Secondary | ICD-10-CM | POA: Diagnosis not present

## 2024-02-29 DIAGNOSIS — T50905D Adverse effect of unspecified drugs, medicaments and biological substances, subsequent encounter: Secondary | ICD-10-CM | POA: Diagnosis not present

## 2024-02-29 DIAGNOSIS — Z5320 Procedure and treatment not carried out because of patient's decision for unspecified reasons: Secondary | ICD-10-CM | POA: Diagnosis not present

## 2024-02-29 DIAGNOSIS — F3340 Major depressive disorder, recurrent, in remission, unspecified: Secondary | ICD-10-CM | POA: Diagnosis not present

## 2024-02-29 DIAGNOSIS — G8194 Hemiplegia, unspecified affecting left nondominant side: Secondary | ICD-10-CM | POA: Diagnosis not present

## 2024-02-29 DIAGNOSIS — Z992 Dependence on renal dialysis: Secondary | ICD-10-CM | POA: Diagnosis not present

## 2024-03-04 DIAGNOSIS — F22 Delusional disorders: Secondary | ICD-10-CM | POA: Diagnosis not present

## 2024-03-04 DIAGNOSIS — F331 Major depressive disorder, recurrent, moderate: Secondary | ICD-10-CM | POA: Diagnosis not present

## 2024-03-14 DIAGNOSIS — R532 Functional quadriplegia: Secondary | ICD-10-CM | POA: Diagnosis not present

## 2024-03-14 DIAGNOSIS — K5909 Other constipation: Secondary | ICD-10-CM | POA: Diagnosis not present

## 2024-03-14 DIAGNOSIS — G629 Polyneuropathy, unspecified: Secondary | ICD-10-CM | POA: Diagnosis not present

## 2024-03-14 DIAGNOSIS — I10 Essential (primary) hypertension: Secondary | ICD-10-CM | POA: Diagnosis not present

## 2024-03-14 DIAGNOSIS — G40909 Epilepsy, unspecified, not intractable, without status epilepticus: Secondary | ICD-10-CM | POA: Diagnosis not present

## 2024-04-07 DIAGNOSIS — B351 Tinea unguium: Secondary | ICD-10-CM | POA: Diagnosis not present

## 2024-04-07 DIAGNOSIS — L603 Nail dystrophy: Secondary | ICD-10-CM | POA: Diagnosis not present

## 2024-04-14 DIAGNOSIS — I10 Essential (primary) hypertension: Secondary | ICD-10-CM | POA: Diagnosis not present

## 2024-04-14 DIAGNOSIS — K5909 Other constipation: Secondary | ICD-10-CM | POA: Diagnosis not present

## 2024-04-14 DIAGNOSIS — G629 Polyneuropathy, unspecified: Secondary | ICD-10-CM | POA: Diagnosis not present

## 2024-04-14 DIAGNOSIS — G40909 Epilepsy, unspecified, not intractable, without status epilepticus: Secondary | ICD-10-CM | POA: Diagnosis not present

## 2024-04-14 DIAGNOSIS — F339 Major depressive disorder, recurrent, unspecified: Secondary | ICD-10-CM | POA: Diagnosis not present

## 2024-04-14 DIAGNOSIS — R532 Functional quadriplegia: Secondary | ICD-10-CM | POA: Diagnosis not present

## 2024-04-20 DIAGNOSIS — R1312 Dysphagia, oropharyngeal phase: Secondary | ICD-10-CM | POA: Diagnosis not present

## 2024-04-20 DIAGNOSIS — I69154 Hemiplegia and hemiparesis following nontraumatic intracerebral hemorrhage affecting left non-dominant side: Secondary | ICD-10-CM | POA: Diagnosis not present

## 2024-04-24 DIAGNOSIS — R1312 Dysphagia, oropharyngeal phase: Secondary | ICD-10-CM | POA: Diagnosis not present

## 2024-04-24 DIAGNOSIS — I69154 Hemiplegia and hemiparesis following nontraumatic intracerebral hemorrhage affecting left non-dominant side: Secondary | ICD-10-CM | POA: Diagnosis not present

## 2024-04-25 DIAGNOSIS — R1312 Dysphagia, oropharyngeal phase: Secondary | ICD-10-CM | POA: Diagnosis not present

## 2024-04-26 DIAGNOSIS — I69154 Hemiplegia and hemiparesis following nontraumatic intracerebral hemorrhage affecting left non-dominant side: Secondary | ICD-10-CM | POA: Diagnosis not present

## 2024-04-26 DIAGNOSIS — R1312 Dysphagia, oropharyngeal phase: Secondary | ICD-10-CM | POA: Diagnosis not present

## 2024-05-01 DIAGNOSIS — I69154 Hemiplegia and hemiparesis following nontraumatic intracerebral hemorrhage affecting left non-dominant side: Secondary | ICD-10-CM | POA: Diagnosis not present

## 2024-05-01 DIAGNOSIS — R1312 Dysphagia, oropharyngeal phase: Secondary | ICD-10-CM | POA: Diagnosis not present

## 2024-05-02 DIAGNOSIS — I69154 Hemiplegia and hemiparesis following nontraumatic intracerebral hemorrhage affecting left non-dominant side: Secondary | ICD-10-CM | POA: Diagnosis not present

## 2024-05-02 DIAGNOSIS — R1312 Dysphagia, oropharyngeal phase: Secondary | ICD-10-CM | POA: Diagnosis not present

## 2024-05-03 DIAGNOSIS — R1312 Dysphagia, oropharyngeal phase: Secondary | ICD-10-CM | POA: Diagnosis not present

## 2024-05-03 DIAGNOSIS — I69154 Hemiplegia and hemiparesis following nontraumatic intracerebral hemorrhage affecting left non-dominant side: Secondary | ICD-10-CM | POA: Diagnosis not present

## 2024-05-05 DIAGNOSIS — I69154 Hemiplegia and hemiparesis following nontraumatic intracerebral hemorrhage affecting left non-dominant side: Secondary | ICD-10-CM | POA: Diagnosis not present

## 2024-05-05 DIAGNOSIS — R1312 Dysphagia, oropharyngeal phase: Secondary | ICD-10-CM | POA: Diagnosis not present

## 2024-05-06 DIAGNOSIS — F22 Delusional disorders: Secondary | ICD-10-CM | POA: Diagnosis not present

## 2024-05-08 DIAGNOSIS — R1312 Dysphagia, oropharyngeal phase: Secondary | ICD-10-CM | POA: Diagnosis not present

## 2024-05-08 DIAGNOSIS — I69154 Hemiplegia and hemiparesis following nontraumatic intracerebral hemorrhage affecting left non-dominant side: Secondary | ICD-10-CM | POA: Diagnosis not present

## 2024-05-10 DIAGNOSIS — R1312 Dysphagia, oropharyngeal phase: Secondary | ICD-10-CM | POA: Diagnosis not present

## 2024-05-10 DIAGNOSIS — I69154 Hemiplegia and hemiparesis following nontraumatic intracerebral hemorrhage affecting left non-dominant side: Secondary | ICD-10-CM | POA: Diagnosis not present

## 2024-05-11 DIAGNOSIS — N186 End stage renal disease: Secondary | ICD-10-CM | POA: Diagnosis not present

## 2024-05-11 DIAGNOSIS — F3340 Major depressive disorder, recurrent, in remission, unspecified: Secondary | ICD-10-CM | POA: Diagnosis not present

## 2024-05-11 DIAGNOSIS — Z992 Dependence on renal dialysis: Secondary | ICD-10-CM | POA: Diagnosis not present

## 2024-05-11 DIAGNOSIS — I69154 Hemiplegia and hemiparesis following nontraumatic intracerebral hemorrhage affecting left non-dominant side: Secondary | ICD-10-CM | POA: Diagnosis not present

## 2024-05-11 DIAGNOSIS — G40909 Epilepsy, unspecified, not intractable, without status epilepticus: Secondary | ICD-10-CM | POA: Diagnosis not present

## 2024-05-11 DIAGNOSIS — Z5320 Procedure and treatment not carried out because of patient's decision for unspecified reasons: Secondary | ICD-10-CM | POA: Diagnosis not present

## 2024-05-11 DIAGNOSIS — D84821 Immunodeficiency due to drugs: Secondary | ICD-10-CM | POA: Diagnosis not present

## 2024-05-11 DIAGNOSIS — Z940 Kidney transplant status: Secondary | ICD-10-CM | POA: Diagnosis not present

## 2024-05-11 DIAGNOSIS — G8194 Hemiplegia, unspecified affecting left nondominant side: Secondary | ICD-10-CM | POA: Diagnosis not present

## 2024-05-11 DIAGNOSIS — R1312 Dysphagia, oropharyngeal phase: Secondary | ICD-10-CM | POA: Diagnosis not present

## 2024-05-12 DIAGNOSIS — I69154 Hemiplegia and hemiparesis following nontraumatic intracerebral hemorrhage affecting left non-dominant side: Secondary | ICD-10-CM | POA: Diagnosis not present

## 2024-05-12 DIAGNOSIS — R1312 Dysphagia, oropharyngeal phase: Secondary | ICD-10-CM | POA: Diagnosis not present

## 2024-05-15 DIAGNOSIS — R1312 Dysphagia, oropharyngeal phase: Secondary | ICD-10-CM | POA: Diagnosis not present

## 2024-05-15 DIAGNOSIS — I69154 Hemiplegia and hemiparesis following nontraumatic intracerebral hemorrhage affecting left non-dominant side: Secondary | ICD-10-CM | POA: Diagnosis not present

## 2024-05-16 DIAGNOSIS — R1312 Dysphagia, oropharyngeal phase: Secondary | ICD-10-CM | POA: Diagnosis not present

## 2024-05-16 DIAGNOSIS — I69154 Hemiplegia and hemiparesis following nontraumatic intracerebral hemorrhage affecting left non-dominant side: Secondary | ICD-10-CM | POA: Diagnosis not present

## 2024-05-17 DIAGNOSIS — R1312 Dysphagia, oropharyngeal phase: Secondary | ICD-10-CM | POA: Diagnosis not present

## 2024-05-17 DIAGNOSIS — I69154 Hemiplegia and hemiparesis following nontraumatic intracerebral hemorrhage affecting left non-dominant side: Secondary | ICD-10-CM | POA: Diagnosis not present

## 2024-05-18 DIAGNOSIS — F22 Delusional disorders: Secondary | ICD-10-CM | POA: Diagnosis not present

## 2024-05-18 DIAGNOSIS — F331 Major depressive disorder, recurrent, moderate: Secondary | ICD-10-CM | POA: Diagnosis not present

## 2024-05-19 DIAGNOSIS — F3340 Major depressive disorder, recurrent, in remission, unspecified: Secondary | ICD-10-CM | POA: Diagnosis not present

## 2024-05-19 DIAGNOSIS — E039 Hypothyroidism, unspecified: Secondary | ICD-10-CM | POA: Diagnosis not present

## 2024-05-19 DIAGNOSIS — R532 Functional quadriplegia: Secondary | ICD-10-CM | POA: Diagnosis not present

## 2024-05-19 DIAGNOSIS — Z940 Kidney transplant status: Secondary | ICD-10-CM | POA: Diagnosis not present

## 2024-05-19 DIAGNOSIS — G40909 Epilepsy, unspecified, not intractable, without status epilepticus: Secondary | ICD-10-CM | POA: Diagnosis not present

## 2024-05-19 DIAGNOSIS — K5909 Other constipation: Secondary | ICD-10-CM | POA: Diagnosis not present

## 2024-05-19 DIAGNOSIS — I10 Essential (primary) hypertension: Secondary | ICD-10-CM | POA: Diagnosis not present

## 2024-05-22 DIAGNOSIS — R1312 Dysphagia, oropharyngeal phase: Secondary | ICD-10-CM | POA: Diagnosis not present

## 2024-05-23 DIAGNOSIS — R1312 Dysphagia, oropharyngeal phase: Secondary | ICD-10-CM | POA: Diagnosis not present

## 2024-05-26 DIAGNOSIS — R1312 Dysphagia, oropharyngeal phase: Secondary | ICD-10-CM | POA: Diagnosis not present

## 2024-05-28 DIAGNOSIS — F22 Delusional disorders: Secondary | ICD-10-CM | POA: Diagnosis not present

## 2024-05-29 DIAGNOSIS — R1312 Dysphagia, oropharyngeal phase: Secondary | ICD-10-CM | POA: Diagnosis not present

## 2024-05-29 DIAGNOSIS — I69154 Hemiplegia and hemiparesis following nontraumatic intracerebral hemorrhage affecting left non-dominant side: Secondary | ICD-10-CM | POA: Diagnosis not present

## 2024-06-12 DIAGNOSIS — B351 Tinea unguium: Secondary | ICD-10-CM | POA: Diagnosis not present

## 2024-06-12 DIAGNOSIS — I7389 Other specified peripheral vascular diseases: Secondary | ICD-10-CM | POA: Diagnosis not present
